# Patient Record
Sex: Female | Born: 1950 | Race: Black or African American | Hispanic: No | Smoking: Never smoker
Health system: Southern US, Community
[De-identification: ages and names within clinical notes are randomized; demographics above are authoritative.]

## PROBLEM LIST (undated history)

## (undated) DIAGNOSIS — K219 Gastro-esophageal reflux disease without esophagitis: Secondary | ICD-10-CM

## (undated) DIAGNOSIS — Z9889 Other specified postprocedural states: Secondary | ICD-10-CM

## (undated) DIAGNOSIS — R51 Headache: Secondary | ICD-10-CM

## (undated) DIAGNOSIS — Z9641 Presence of insulin pump (external) (internal): Secondary | ICD-10-CM

## (undated) DIAGNOSIS — Z973 Presence of spectacles and contact lenses: Secondary | ICD-10-CM

## (undated) DIAGNOSIS — J189 Pneumonia, unspecified organism: Secondary | ICD-10-CM

## (undated) DIAGNOSIS — M5126 Other intervertebral disc displacement, lumbar region: Secondary | ICD-10-CM

## (undated) DIAGNOSIS — M1990 Unspecified osteoarthritis, unspecified site: Secondary | ICD-10-CM

## (undated) DIAGNOSIS — E119 Type 2 diabetes mellitus without complications: Secondary | ICD-10-CM

## (undated) DIAGNOSIS — E785 Hyperlipidemia, unspecified: Secondary | ICD-10-CM

## (undated) DIAGNOSIS — I10 Essential (primary) hypertension: Secondary | ICD-10-CM

## (undated) DIAGNOSIS — Z8719 Personal history of other diseases of the digestive system: Secondary | ICD-10-CM

## (undated) DIAGNOSIS — M797 Fibromyalgia: Secondary | ICD-10-CM

## (undated) DIAGNOSIS — Z8739 Personal history of other diseases of the musculoskeletal system and connective tissue: Secondary | ICD-10-CM

## (undated) DIAGNOSIS — I350 Nonrheumatic aortic (valve) stenosis: Secondary | ICD-10-CM

## (undated) DIAGNOSIS — J45909 Unspecified asthma, uncomplicated: Secondary | ICD-10-CM

## (undated) DIAGNOSIS — N393 Stress incontinence (female) (male): Secondary | ICD-10-CM

## (undated) DIAGNOSIS — K08109 Complete loss of teeth, unspecified cause, unspecified class: Secondary | ICD-10-CM

## (undated) DIAGNOSIS — Z794 Long term (current) use of insulin: Secondary | ICD-10-CM

## (undated) DIAGNOSIS — Z972 Presence of dental prosthetic device (complete) (partial): Secondary | ICD-10-CM

## (undated) DIAGNOSIS — N289 Disorder of kidney and ureter, unspecified: Secondary | ICD-10-CM

## (undated) DIAGNOSIS — R519 Headache, unspecified: Secondary | ICD-10-CM

## (undated) DIAGNOSIS — M1712 Unilateral primary osteoarthritis, left knee: Secondary | ICD-10-CM

## (undated) HISTORY — PX: CARDIAC CATHETERIZATION: SHX172

## (undated) HISTORY — PX: KNEE ARTHROSCOPY: SUR90

## (undated) HISTORY — DX: Hyperlipidemia, unspecified: E78.5

## (undated) HISTORY — PX: MULTIPLE TOOTH EXTRACTIONS: SHX2053

---

## 1978-08-27 DIAGNOSIS — E119 Type 2 diabetes mellitus without complications: Secondary | ICD-10-CM | POA: Insufficient documentation

## 1988-08-27 HISTORY — PX: CHOLECYSTECTOMY: SHX55

## 1998-08-27 HISTORY — PX: CARPAL TUNNEL RELEASE: SHX101

## 1999-02-07 ENCOUNTER — Emergency Department (HOSPITAL_COMMUNITY): Admission: EM | Admit: 1999-02-07 | Discharge: 1999-02-07 | Payer: Self-pay | Admitting: Emergency Medicine

## 1999-02-07 ENCOUNTER — Encounter: Payer: Self-pay | Admitting: Emergency Medicine

## 1999-05-01 ENCOUNTER — Emergency Department (HOSPITAL_COMMUNITY): Admission: EM | Admit: 1999-05-01 | Discharge: 1999-05-01 | Payer: Self-pay | Admitting: Emergency Medicine

## 1999-05-01 ENCOUNTER — Encounter: Payer: Self-pay | Admitting: Emergency Medicine

## 1999-05-03 ENCOUNTER — Emergency Department (HOSPITAL_COMMUNITY): Admission: EM | Admit: 1999-05-03 | Discharge: 1999-05-03 | Payer: Self-pay | Admitting: Emergency Medicine

## 1999-07-02 ENCOUNTER — Emergency Department (HOSPITAL_COMMUNITY): Admission: EM | Admit: 1999-07-02 | Discharge: 1999-07-02 | Payer: Self-pay | Admitting: Emergency Medicine

## 1999-08-03 ENCOUNTER — Emergency Department (HOSPITAL_COMMUNITY): Admission: EM | Admit: 1999-08-03 | Discharge: 1999-08-03 | Payer: Self-pay | Admitting: Emergency Medicine

## 1999-08-03 ENCOUNTER — Encounter: Payer: Self-pay | Admitting: Emergency Medicine

## 1999-11-08 ENCOUNTER — Emergency Department (HOSPITAL_COMMUNITY): Admission: EM | Admit: 1999-11-08 | Discharge: 1999-11-08 | Payer: Self-pay | Admitting: Emergency Medicine

## 1999-11-11 ENCOUNTER — Emergency Department (HOSPITAL_COMMUNITY): Admission: EM | Admit: 1999-11-11 | Discharge: 1999-11-11 | Payer: Self-pay | Admitting: Emergency Medicine

## 1999-11-11 ENCOUNTER — Encounter: Payer: Self-pay | Admitting: Emergency Medicine

## 2000-11-22 ENCOUNTER — Emergency Department (HOSPITAL_COMMUNITY): Admission: EM | Admit: 2000-11-22 | Discharge: 2000-11-22 | Payer: Self-pay | Admitting: Emergency Medicine

## 2001-06-14 ENCOUNTER — Emergency Department (HOSPITAL_COMMUNITY): Admission: EM | Admit: 2001-06-14 | Discharge: 2001-06-14 | Payer: Self-pay | Admitting: Emergency Medicine

## 2001-06-20 ENCOUNTER — Encounter: Admission: RE | Admit: 2001-06-20 | Discharge: 2001-06-20 | Payer: Self-pay | Admitting: Endocrinology

## 2001-06-20 ENCOUNTER — Encounter: Payer: Self-pay | Admitting: Endocrinology

## 2001-10-14 ENCOUNTER — Emergency Department (HOSPITAL_COMMUNITY): Admission: EM | Admit: 2001-10-14 | Discharge: 2001-10-14 | Payer: Self-pay | Admitting: Emergency Medicine

## 2001-10-14 ENCOUNTER — Encounter: Payer: Self-pay | Admitting: Emergency Medicine

## 2002-01-18 ENCOUNTER — Encounter: Payer: Self-pay | Admitting: Emergency Medicine

## 2002-01-18 ENCOUNTER — Emergency Department (HOSPITAL_COMMUNITY): Admission: EM | Admit: 2002-01-18 | Discharge: 2002-01-18 | Payer: Self-pay | Admitting: Emergency Medicine

## 2002-03-31 ENCOUNTER — Other Ambulatory Visit: Admission: RE | Admit: 2002-03-31 | Discharge: 2002-03-31 | Payer: Self-pay | Admitting: Family Medicine

## 2002-04-06 ENCOUNTER — Emergency Department (HOSPITAL_COMMUNITY): Admission: EM | Admit: 2002-04-06 | Discharge: 2002-04-06 | Payer: Self-pay | Admitting: Emergency Medicine

## 2002-04-06 ENCOUNTER — Encounter: Payer: Self-pay | Admitting: Emergency Medicine

## 2002-05-15 ENCOUNTER — Encounter: Admission: RE | Admit: 2002-05-15 | Discharge: 2002-05-15 | Payer: Self-pay | Admitting: Gastroenterology

## 2002-05-15 ENCOUNTER — Encounter: Payer: Self-pay | Admitting: Gastroenterology

## 2002-06-17 ENCOUNTER — Ambulatory Visit (HOSPITAL_COMMUNITY): Admission: RE | Admit: 2002-06-17 | Discharge: 2002-06-17 | Payer: Self-pay | Admitting: Gastroenterology

## 2002-06-18 HISTORY — PX: COLONOSCOPY WITH ESOPHAGOGASTRODUODENOSCOPY (EGD): SHX5779

## 2003-06-02 ENCOUNTER — Emergency Department (HOSPITAL_COMMUNITY): Admission: EM | Admit: 2003-06-02 | Discharge: 2003-06-02 | Payer: Self-pay | Admitting: Emergency Medicine

## 2003-06-02 ENCOUNTER — Encounter: Payer: Self-pay | Admitting: Emergency Medicine

## 2005-04-26 ENCOUNTER — Emergency Department (HOSPITAL_COMMUNITY): Admission: EM | Admit: 2005-04-26 | Discharge: 2005-04-26 | Payer: Self-pay | Admitting: Emergency Medicine

## 2005-05-15 ENCOUNTER — Ambulatory Visit (HOSPITAL_COMMUNITY): Admission: RE | Admit: 2005-05-15 | Discharge: 2005-05-15 | Payer: Self-pay | Admitting: Gastroenterology

## 2005-08-27 DIAGNOSIS — K222 Esophageal obstruction: Secondary | ICD-10-CM

## 2006-01-18 ENCOUNTER — Emergency Department (HOSPITAL_COMMUNITY): Admission: EM | Admit: 2006-01-18 | Discharge: 2006-01-19 | Payer: Self-pay | Admitting: Emergency Medicine

## 2006-08-02 ENCOUNTER — Ambulatory Visit: Payer: Self-pay | Admitting: Family Medicine

## 2006-08-02 ENCOUNTER — Observation Stay (HOSPITAL_COMMUNITY): Admission: EM | Admit: 2006-08-02 | Discharge: 2006-08-03 | Payer: Self-pay | Admitting: Emergency Medicine

## 2006-10-31 ENCOUNTER — Encounter: Admission: RE | Admit: 2006-10-31 | Discharge: 2006-10-31 | Payer: Self-pay | Admitting: Family Medicine

## 2006-11-15 ENCOUNTER — Encounter: Admission: RE | Admit: 2006-11-15 | Discharge: 2006-11-15 | Payer: Self-pay | Admitting: Family Medicine

## 2006-11-15 ENCOUNTER — Encounter (INDEPENDENT_AMBULATORY_CARE_PROVIDER_SITE_OTHER): Payer: Self-pay | Admitting: Specialist

## 2008-08-27 HISTORY — PX: EYE SURGERY: SHX253

## 2009-02-07 ENCOUNTER — Encounter: Admission: RE | Admit: 2009-02-07 | Discharge: 2009-02-07 | Payer: Self-pay | Admitting: Family Medicine

## 2009-07-19 ENCOUNTER — Inpatient Hospital Stay (HOSPITAL_COMMUNITY): Admission: AD | Admit: 2009-07-19 | Discharge: 2009-07-21 | Payer: Self-pay | Admitting: Cardiology

## 2009-07-19 DIAGNOSIS — R079 Chest pain, unspecified: Secondary | ICD-10-CM | POA: Insufficient documentation

## 2009-09-08 ENCOUNTER — Ambulatory Visit: Payer: Self-pay | Admitting: Nurse Practitioner

## 2009-09-08 DIAGNOSIS — J45909 Unspecified asthma, uncomplicated: Secondary | ICD-10-CM | POA: Insufficient documentation

## 2009-09-08 DIAGNOSIS — I10 Essential (primary) hypertension: Secondary | ICD-10-CM

## 2009-09-08 DIAGNOSIS — N39498 Other specified urinary incontinence: Secondary | ICD-10-CM | POA: Insufficient documentation

## 2009-09-08 DIAGNOSIS — G589 Mononeuropathy, unspecified: Secondary | ICD-10-CM | POA: Insufficient documentation

## 2009-09-08 LAB — CONVERTED CEMR LAB
Cholesterol, target level: 200 mg/dL
HDL goal, serum: 40 mg/dL
Hgb A1c MFr Bld: 12.3 %

## 2009-09-09 ENCOUNTER — Encounter (INDEPENDENT_AMBULATORY_CARE_PROVIDER_SITE_OTHER): Payer: Self-pay | Admitting: Nurse Practitioner

## 2009-09-22 ENCOUNTER — Ambulatory Visit: Payer: Self-pay | Admitting: Nurse Practitioner

## 2009-09-22 LAB — CONVERTED CEMR LAB
Alkaline Phosphatase: 96 units/L (ref 39–117)
BUN: 14 mg/dL (ref 6–23)
Basophils Relative: 0 % (ref 0–1)
Blood Glucose, Fingerstick: 96
Eosinophils Absolute: 0.1 10*3/uL (ref 0.0–0.7)
Glucose, Bld: 87 mg/dL (ref 70–99)
HDL: 62 mg/dL (ref >39)
Hemoglobin: 13.9 g/dL (ref 12.0–15.0)
Ketones, urine, test strip: NEGATIVE
LDL Cholesterol: 119 mg/dL — ABNORMAL HIGH (ref 0–99)
Lymphs Abs: 1.3 10*3/uL (ref 0.7–4.0)
MCHC: 31.2 g/dL (ref 30.0–36.0)
MCV: 93.3 fL (ref 78.0–100.0)
Monocytes Absolute: 0.3 10*3/uL (ref 0.1–1.0)
Monocytes Relative: 6 % (ref 3–12)
Neutrophils Relative %: 68 % (ref 43–77)
Nitrite: NEGATIVE
RBC: 4.77 M/uL (ref 3.87–5.11)
Sodium: 139 meq/L (ref 135–145)
Specific Gravity, Urine: 1.01
Total Bilirubin: 0.6 mg/dL (ref 0.3–1.2)
Triglycerides: 44 mg/dL (ref <150)
Urobilinogen, UA: 0.2
VLDL: 9 mg/dL (ref 0–40)
WBC Urine, dipstick: NEGATIVE
WBC: 5.3 10*3/uL (ref 4.0–10.5)
pH: 7

## 2009-09-23 ENCOUNTER — Telehealth (INDEPENDENT_AMBULATORY_CARE_PROVIDER_SITE_OTHER): Payer: Self-pay | Admitting: Nurse Practitioner

## 2009-09-23 ENCOUNTER — Encounter (INDEPENDENT_AMBULATORY_CARE_PROVIDER_SITE_OTHER): Payer: Self-pay | Admitting: Nurse Practitioner

## 2009-10-18 ENCOUNTER — Ambulatory Visit: Payer: Self-pay | Admitting: Nurse Practitioner

## 2009-10-18 DIAGNOSIS — R3129 Other microscopic hematuria: Secondary | ICD-10-CM | POA: Insufficient documentation

## 2009-10-18 DIAGNOSIS — M25569 Pain in unspecified knee: Secondary | ICD-10-CM

## 2009-10-19 ENCOUNTER — Encounter (INDEPENDENT_AMBULATORY_CARE_PROVIDER_SITE_OTHER): Payer: Self-pay | Admitting: Nurse Practitioner

## 2009-10-21 DIAGNOSIS — N3000 Acute cystitis without hematuria: Secondary | ICD-10-CM | POA: Insufficient documentation

## 2009-10-24 ENCOUNTER — Encounter (INDEPENDENT_AMBULATORY_CARE_PROVIDER_SITE_OTHER): Payer: Self-pay | Admitting: Nurse Practitioner

## 2009-10-25 ENCOUNTER — Encounter (INDEPENDENT_AMBULATORY_CARE_PROVIDER_SITE_OTHER): Payer: Self-pay | Admitting: Nurse Practitioner

## 2009-10-25 ENCOUNTER — Ambulatory Visit: Payer: Self-pay | Admitting: Physician Assistant

## 2009-10-27 ENCOUNTER — Telehealth (INDEPENDENT_AMBULATORY_CARE_PROVIDER_SITE_OTHER): Payer: Self-pay | Admitting: Nurse Practitioner

## 2009-11-29 ENCOUNTER — Ambulatory Visit: Payer: Self-pay | Admitting: Nurse Practitioner

## 2009-12-23 ENCOUNTER — Telehealth (INDEPENDENT_AMBULATORY_CARE_PROVIDER_SITE_OTHER): Payer: Self-pay | Admitting: Nurse Practitioner

## 2009-12-27 ENCOUNTER — Ambulatory Visit: Payer: Self-pay | Admitting: Nurse Practitioner

## 2009-12-27 DIAGNOSIS — F329 Major depressive disorder, single episode, unspecified: Secondary | ICD-10-CM

## 2009-12-27 DIAGNOSIS — IMO0002 Reserved for concepts with insufficient information to code with codable children: Secondary | ICD-10-CM | POA: Insufficient documentation

## 2010-01-10 ENCOUNTER — Ambulatory Visit: Payer: Self-pay | Admitting: Nurse Practitioner

## 2010-01-10 DIAGNOSIS — D1720 Benign lipomatous neoplasm of skin and subcutaneous tissue of unspecified limb: Secondary | ICD-10-CM

## 2010-01-10 DIAGNOSIS — B373 Candidiasis of vulva and vagina: Secondary | ICD-10-CM

## 2010-01-10 LAB — CONVERTED CEMR LAB
Bilirubin Urine: NEGATIVE
Blood Glucose, Fingerstick: 325
Glucose, Urine, Semiquant: >=1000
Ketones, urine, test strip: NEGATIVE
Nitrite: NEGATIVE
Specific Gravity, Urine: 1.015
Urobilinogen, UA: 0.2

## 2010-01-11 ENCOUNTER — Encounter (INDEPENDENT_AMBULATORY_CARE_PROVIDER_SITE_OTHER): Payer: Self-pay | Admitting: Nurse Practitioner

## 2010-01-16 ENCOUNTER — Ambulatory Visit: Payer: Self-pay | Admitting: Nurse Practitioner

## 2010-01-19 ENCOUNTER — Ambulatory Visit: Payer: Self-pay | Admitting: Nurse Practitioner

## 2010-02-07 ENCOUNTER — Ambulatory Visit: Payer: Self-pay | Admitting: Nurse Practitioner

## 2010-02-07 LAB — CONVERTED CEMR LAB
Bilirubin Urine: NEGATIVE
Blood Glucose, Fingerstick: 335
Glucose, Urine, Semiquant: >=1000
Ketones, urine, test strip: NEGATIVE
Specific Gravity, Urine: 1.01
pH: 6

## 2010-02-08 ENCOUNTER — Telehealth (INDEPENDENT_AMBULATORY_CARE_PROVIDER_SITE_OTHER): Payer: Self-pay | Admitting: Nurse Practitioner

## 2010-02-14 ENCOUNTER — Ambulatory Visit: Payer: Self-pay | Admitting: Nurse Practitioner

## 2010-02-14 LAB — CONVERTED CEMR LAB: Blood Glucose, Fingerstick: 472

## 2010-02-24 ENCOUNTER — Telehealth (INDEPENDENT_AMBULATORY_CARE_PROVIDER_SITE_OTHER): Payer: Self-pay | Admitting: Nurse Practitioner

## 2010-02-28 ENCOUNTER — Ambulatory Visit: Payer: Self-pay | Admitting: Nurse Practitioner

## 2010-02-28 ENCOUNTER — Encounter (INDEPENDENT_AMBULATORY_CARE_PROVIDER_SITE_OTHER): Payer: Self-pay | Admitting: Nurse Practitioner

## 2010-02-28 ENCOUNTER — Telehealth (INDEPENDENT_AMBULATORY_CARE_PROVIDER_SITE_OTHER): Payer: Self-pay | Admitting: Nurse Practitioner

## 2010-02-28 LAB — CONVERTED CEMR LAB
Bilirubin Urine: NEGATIVE
Blood in Urine, dipstick: NEGATIVE
Nitrite: NEGATIVE
Protein, U semiquant: 100
Specific Gravity, Urine: >=1.03
Urobilinogen, UA: 0.2
WBC Urine, dipstick: NEGATIVE

## 2010-03-02 ENCOUNTER — Ambulatory Visit (HOSPITAL_COMMUNITY): Admission: RE | Admit: 2010-03-02 | Discharge: 2010-03-02 | Payer: Self-pay | Admitting: Internal Medicine

## 2010-03-07 ENCOUNTER — Telehealth (INDEPENDENT_AMBULATORY_CARE_PROVIDER_SITE_OTHER): Payer: Self-pay | Admitting: Nurse Practitioner

## 2010-03-09 ENCOUNTER — Ambulatory Visit: Payer: Self-pay | Admitting: Nurse Practitioner

## 2010-03-09 ENCOUNTER — Encounter (INDEPENDENT_AMBULATORY_CARE_PROVIDER_SITE_OTHER): Payer: Self-pay | Admitting: Nurse Practitioner

## 2010-03-09 DIAGNOSIS — M67919 Unspecified disorder of synovium and tendon, unspecified shoulder: Secondary | ICD-10-CM | POA: Insufficient documentation

## 2010-03-09 DIAGNOSIS — M719 Bursopathy, unspecified: Secondary | ICD-10-CM

## 2010-03-09 LAB — CONVERTED CEMR LAB
Blood Glucose, Fingerstick: 364
Blood in Urine, dipstick: NEGATIVE
Protein, U semiquant: NEGATIVE
Urobilinogen, UA: 0.2
pH: 5

## 2010-03-13 ENCOUNTER — Ambulatory Visit: Payer: Self-pay | Admitting: Nurse Practitioner

## 2010-03-17 ENCOUNTER — Telehealth (INDEPENDENT_AMBULATORY_CARE_PROVIDER_SITE_OTHER): Payer: Self-pay | Admitting: Nurse Practitioner

## 2010-03-21 ENCOUNTER — Ambulatory Visit: Payer: Self-pay | Admitting: Nurse Practitioner

## 2010-03-21 LAB — CONVERTED CEMR LAB
Ketones, urine, test strip: NEGATIVE
Nitrite: NEGATIVE
Protein, U semiquant: NEGATIVE
Urobilinogen, UA: 0.2

## 2010-03-22 ENCOUNTER — Encounter (INDEPENDENT_AMBULATORY_CARE_PROVIDER_SITE_OTHER): Payer: Self-pay | Admitting: Nurse Practitioner

## 2010-03-23 ENCOUNTER — Ambulatory Visit: Payer: Self-pay | Admitting: Nurse Practitioner

## 2010-03-23 LAB — CONVERTED CEMR LAB: Blood Glucose, Fingerstick: 342

## 2010-03-30 ENCOUNTER — Ambulatory Visit: Payer: Self-pay | Admitting: Nurse Practitioner

## 2010-04-13 ENCOUNTER — Encounter: Admission: RE | Admit: 2010-04-13 | Discharge: 2010-05-10 | Payer: Self-pay | Admitting: Internal Medicine

## 2010-04-13 ENCOUNTER — Encounter (INDEPENDENT_AMBULATORY_CARE_PROVIDER_SITE_OTHER): Payer: Self-pay | Admitting: Nurse Practitioner

## 2010-04-20 ENCOUNTER — Ambulatory Visit: Payer: Self-pay | Admitting: Nurse Practitioner

## 2010-04-20 DIAGNOSIS — E669 Obesity, unspecified: Secondary | ICD-10-CM

## 2010-04-20 LAB — CONVERTED CEMR LAB
Bilirubin Urine: NEGATIVE
Ketones, urine, test strip: NEGATIVE
Nitrite: NEGATIVE
Protein, U semiquant: 30
Urobilinogen, UA: 0.2
pH: 7

## 2010-05-02 ENCOUNTER — Ambulatory Visit: Payer: Self-pay | Admitting: Physician Assistant

## 2010-05-02 ENCOUNTER — Ambulatory Visit (HOSPITAL_COMMUNITY): Admission: RE | Admit: 2010-05-02 | Discharge: 2010-05-02 | Payer: Self-pay | Admitting: Physician Assistant

## 2010-05-02 DIAGNOSIS — R1084 Generalized abdominal pain: Secondary | ICD-10-CM

## 2010-05-02 DIAGNOSIS — N390 Urinary tract infection, site not specified: Secondary | ICD-10-CM | POA: Insufficient documentation

## 2010-05-02 LAB — CONVERTED CEMR LAB
Bilirubin Urine: NEGATIVE
Blood in Urine, dipstick: NEGATIVE
Glucose, Urine, Semiquant: >=1000
Ketones, urine, test strip: NEGATIVE
Specific Gravity, Urine: 1.01
pH: 8

## 2010-05-03 ENCOUNTER — Encounter: Payer: Self-pay | Admitting: Physician Assistant

## 2010-05-03 LAB — CONVERTED CEMR LAB
ALT: 16 units/L (ref 0–35)
Alkaline Phosphatase: 108 units/L (ref 39–117)
Creatinine, Ser: 1.12 mg/dL (ref 0.40–1.20)
Eosinophils Absolute: 0.1 10*3/uL (ref 0.0–0.7)
Eosinophils Relative: 1 % (ref 0–5)
HCT: 45.6 % (ref 36.0–46.0)
Hemoglobin: 14.4 g/dL (ref 12.0–15.0)
Lipase: 18 units/L (ref 0–75)
Lymphocytes Relative: 21 % (ref 12–46)
Lymphs Abs: 1.4 10*3/uL (ref 0.7–4.0)
MCV: 93.6 fL (ref 78.0–100.0)
Monocytes Absolute: 0.4 10*3/uL (ref 0.1–1.0)
Monocytes Relative: 6 % (ref 3–12)
Platelets: 297 10*3/uL (ref 150–400)
RBC: 4.87 M/uL (ref 3.87–5.11)
Sodium: 137 meq/L (ref 135–145)
Total Bilirubin: 0.7 mg/dL (ref 0.3–1.2)
Total Protein: 7.5 g/dL (ref 6.0–8.3)
WBC: 6.9 10*3/uL (ref 4.0–10.5)

## 2010-05-05 DIAGNOSIS — B3749 Other urogenital candidiasis: Secondary | ICD-10-CM

## 2010-05-09 ENCOUNTER — Ambulatory Visit: Payer: Self-pay | Admitting: Physician Assistant

## 2010-05-10 ENCOUNTER — Encounter (INDEPENDENT_AMBULATORY_CARE_PROVIDER_SITE_OTHER): Payer: Self-pay | Admitting: Nurse Practitioner

## 2010-05-18 ENCOUNTER — Ambulatory Visit: Payer: Self-pay | Admitting: Nurse Practitioner

## 2010-05-18 DIAGNOSIS — K219 Gastro-esophageal reflux disease without esophagitis: Secondary | ICD-10-CM | POA: Insufficient documentation

## 2010-05-23 ENCOUNTER — Ambulatory Visit (HOSPITAL_COMMUNITY): Admission: RE | Admit: 2010-05-23 | Discharge: 2010-05-23 | Payer: Self-pay | Admitting: Internal Medicine

## 2010-05-23 ENCOUNTER — Telehealth (INDEPENDENT_AMBULATORY_CARE_PROVIDER_SITE_OTHER): Payer: Self-pay | Admitting: Nurse Practitioner

## 2010-05-31 ENCOUNTER — Ambulatory Visit: Payer: Self-pay | Admitting: Physician Assistant

## 2010-05-31 ENCOUNTER — Encounter (INDEPENDENT_AMBULATORY_CARE_PROVIDER_SITE_OTHER): Payer: Self-pay | Admitting: Nurse Practitioner

## 2010-05-31 LAB — CONVERTED CEMR LAB: TSH: 2.047 microintl units/mL (ref 0.350–4.500)

## 2010-06-01 ENCOUNTER — Encounter (INDEPENDENT_AMBULATORY_CARE_PROVIDER_SITE_OTHER): Payer: Self-pay | Admitting: Nurse Practitioner

## 2010-06-06 ENCOUNTER — Encounter (INDEPENDENT_AMBULATORY_CARE_PROVIDER_SITE_OTHER): Payer: Self-pay | Admitting: Nurse Practitioner

## 2010-06-07 ENCOUNTER — Encounter (INDEPENDENT_AMBULATORY_CARE_PROVIDER_SITE_OTHER): Payer: Self-pay | Admitting: Nurse Practitioner

## 2010-06-12 ENCOUNTER — Ambulatory Visit (HOSPITAL_COMMUNITY): Admission: RE | Admit: 2010-06-12 | Discharge: 2010-06-12 | Payer: Self-pay | Admitting: Gastroenterology

## 2010-06-12 HISTORY — PX: ESOPHAGOGASTRODUODENOSCOPY (EGD) WITH ESOPHAGEAL DILATION: SHX5812

## 2010-06-13 ENCOUNTER — Ambulatory Visit: Payer: Self-pay | Admitting: Nurse Practitioner

## 2010-06-13 LAB — CONVERTED CEMR LAB
Blood in Urine, dipstick: NEGATIVE
Ketones, urine, test strip: NEGATIVE
Nitrite: NEGATIVE
Urobilinogen, UA: 0.2
WBC Urine, dipstick: NEGATIVE

## 2010-06-19 ENCOUNTER — Encounter: Admission: RE | Admit: 2010-06-19 | Discharge: 2010-06-19 | Payer: Self-pay | Admitting: Nurse Practitioner

## 2010-06-20 ENCOUNTER — Encounter (INDEPENDENT_AMBULATORY_CARE_PROVIDER_SITE_OTHER): Payer: Self-pay | Admitting: Nurse Practitioner

## 2010-06-20 ENCOUNTER — Ambulatory Visit (HOSPITAL_COMMUNITY): Admission: RE | Admit: 2010-06-20 | Discharge: 2010-06-20 | Payer: Self-pay | Admitting: Gastroenterology

## 2010-06-28 ENCOUNTER — Encounter (INDEPENDENT_AMBULATORY_CARE_PROVIDER_SITE_OTHER): Payer: Self-pay | Admitting: Nurse Practitioner

## 2010-07-06 ENCOUNTER — Encounter (INDEPENDENT_AMBULATORY_CARE_PROVIDER_SITE_OTHER): Payer: Self-pay | Admitting: Nurse Practitioner

## 2010-08-14 ENCOUNTER — Encounter (INDEPENDENT_AMBULATORY_CARE_PROVIDER_SITE_OTHER): Payer: Self-pay | Admitting: Nurse Practitioner

## 2010-08-14 ENCOUNTER — Ambulatory Visit: Payer: Self-pay | Admitting: Nurse Practitioner

## 2010-08-14 LAB — CONVERTED CEMR LAB
Blood Glucose, Fingerstick: 315
Ketones, urine, test strip: NEGATIVE
Microalb, Ur: 0.5 mg/dL (ref 0.00–1.89)
Nitrite: NEGATIVE
Protein, U semiquant: NEGATIVE
Urobilinogen, UA: 0.2

## 2010-08-27 HISTORY — PX: OTHER SURGICAL HISTORY: SHX169

## 2010-08-29 ENCOUNTER — Ambulatory Visit
Admission: RE | Admit: 2010-08-29 | Discharge: 2010-08-29 | Payer: Self-pay | Source: Home / Self Care | Attending: Nurse Practitioner | Admitting: Nurse Practitioner

## 2010-08-29 LAB — CONVERTED CEMR LAB
Bilirubin Urine: NEGATIVE
Glucose, Urine, Semiquant: 250
Specific Gravity, Urine: >=1.03
Urobilinogen, UA: 1
pH: 6

## 2010-08-30 ENCOUNTER — Encounter (INDEPENDENT_AMBULATORY_CARE_PROVIDER_SITE_OTHER): Payer: Self-pay | Admitting: Internal Medicine

## 2010-09-12 ENCOUNTER — Ambulatory Visit: Admit: 2010-09-12 | Payer: Self-pay | Admitting: Internal Medicine

## 2010-09-24 LAB — CONVERTED CEMR LAB
Blood Glucose, Fingerstick: 339
GC Probe Amp, Genital: NEGATIVE
Glucose, Urine, Semiquant: >=1000
Specific Gravity, Urine: 1.02
pH: 5

## 2010-09-26 NOTE — Letter (Signed)
Summary: REFERRAL//DIABETES METTITUS//APPT DATE & TIME  REFERRAL//DIABETES METTITUS//APPT DATE & TIME   Imported By: Arta Bruce 12/13/2009 14:54:02  _____________________________________________________________________  External Attachment:    Type:   Image     Comment:   External Document

## 2010-09-26 NOTE — Progress Notes (Signed)
Summary: pain and pressure in lower abdomen  Phone Note Call from Patient   Summary of Call: The pt hss pain in her female area when she try to uriniate and pt doesn't know if she needs to be seen or probably the provider can call her in something at the pharmacy.  [facility] [address] Daphine Deutscher FnP Initial call taken by: Manon Hilding,  March 17, 2010 3:35 PM  Follow-up for Phone Call        forward to N. Daphine Deutscher, FNP spoke with pt  she says for the past 2 days she has been having an urgent feeling to go uriate with pain and alot of lower abdominal pressure, but when she goes to urinate onlly alittle comes out and she is really having alot of pain and  pressure.  Follow-up by: Levon Hedger,  March 17, 2010 4:53 PM  Additional Follow-up for Phone Call Additional follow up Details #1::        It is now after hours on friday  pt has an appt on July 28th Call to check on pt on Monday 03/20/2010 and if she is still symptomatic she can come in for a nurse visit - u/a, wet prep otherwise she can wait until f/u on 03/23/2010  Additional Follow-up by: Lehman Prom FNP,  March 17, 2010 5:05 PM    Additional Follow-up for Phone Call Additional follow up Details #2::    pt called back again today and she will come tomorrow for a wet prep test.Graciela Kellar  March 20, 2010 2:50 PM

## 2010-09-26 NOTE — Progress Notes (Signed)
Summary: Office Visit//DEPRESSION SCREENING  Office Visit//DEPRESSION SCREENING   Imported By: Arta Bruce 02/23/2010 11:15:31  _____________________________________________________________________  External Attachment:    Type:   Image     Comment:   External Document

## 2010-09-26 NOTE — Assessment & Plan Note (Signed)
Summary: Diabetes/HTN   Vital Signs:  Patient profile:   60 year old female Menstrual status:  postmenopausal Height:      61 inches Weight:      203 pounds Temp:     97.3 degrees F oral Pulse rate:   101 / minute Pulse rhythm:   regular BP sitting:   159 / 86  (left arm) Cuff size:   regular  Vitals Entered By: Levon Hedger (April 20, 2010 9:00 AM) CC: follow-up visit 4 week DM, Hypertension Management, Abdominal Pain, Depression Is Patient Diabetic? Yes Pain Assessment Patient in pain? no      CBG Result 224  Does patient need assistance? Functional Status Self care Ambulation Normal   CC:  follow-up visit 4 week DM, Hypertension Management, Abdominal Pain, and Depression.  History of Present Illness:  Pt into the office for diabetes and high blood pressure  Obesity - Pt has been walking for a total of 1 hr per day.  She also has drastically changed her diet and is only eating fish and chicken.  She has decreased sweet tea and sodas from her diet.  She has concerns that she is trying to change her diet but her weight is increasing. Pt has been seeing the nutritionist [facility] Increased 5 pounds since last visit  Depression History:      The patient is having a depressed mood most of the day and has a diminished interest in her usual daily activities.  The patient denies recurrent thoughts of death or suicide.        The patient denies that she feels like life is not worth living, denies that she wishes that she were dead, and denies that she has thought about ending her life.        Comments:  Pt was started on cymbalta on 04/03/2010 by the [facility].  Depression Treatment History:  Prior Medication Used:   Start Date: Assessment of Effect:   Comments:  savella     --     some improvement     will taper off Wellbutrin (bupropion)     02/28/2010     --       not started - could not get from the pharmacy  Diabetes Management History:      The patient  is a 60 years old female who comes in for evaluation of Type 2 Diabetes Mellitus.  She is (or has been) enrolled in the "Diabetic Education Program".  She states understanding of dietary principles and is following her diet appropriately.  Sensory loss is noted.  Self foot exams are not being performed.  She is checking home blood sugars.  She says that she is exercising.  Type of exercise includes: walking.  Duration of exercise is estimated to be 15 min.  She is doing this 3 times per week.        Hypoglycemic symptoms are not occurring.  No hyperglycemic symptoms are reported.  Other comments include: pt presents today with her blood sugar log.        No changes have been made to her treatment plan since last visit.    Dyspepsia History:      She has no alarm features of dyspepsia including no history of melena, hematochezia, dysphagia, persistent vomiting, or involuntary weight loss > 5%.  There is a prior history of GERD.  The patient does not have a prior history of documented ulcer disease.  An H-2 blocker medication is not  currently being taken.  No previous upper endoscopy has been done.    Hypertension History:      She denies headache, chest pain, and palpitations.  She notes no problems with any antihypertensive medication side effects.  Pt was running low on diovan and clonidine and she has not been taking daily so she has been trying to stretch the medicaitons until her visit here.        Positive major cardiovascular risk factors include female age 3 years old or older, diabetes, and hypertension.  Negative major cardiovascular risk factors include non-tobacco-user status.        Further assessment for target organ damage reveals no history of ASHD, cardiac end-organ damage (CHF/LVH), stroke/TIA, peripheral vascular disease, renal insufficiency, or hypertensive retinopathy.       Habits & Providers  Alcohol-Tobacco-Diet     Alcohol drinks/day: 0     Tobacco Status:  never  Exercise-Depression-Behavior     Does Patient Exercise: yes     Exercise Counseling: to improve exercise regimen     Type of exercise: walking     Exercise (avg: min/session): 15 min.     Times/week: 3     Have you felt down or hopeless? no     Have you felt little pleasure in things? no     Depression Counseling: further diagnostic testing and/or other treatment is indicated     Drug Use: never  Allergies (verified): No Known Drug Allergies  Review of Systems General:  Complains of fatigue. CV:  Denies chest pain or discomfort. Resp:  Denies cough. GI:  Complains of abdominal pain and gas; denies diarrhea, nausea, and vomiting. MS:  Complains of joint pain; Pt has started physical therapy and she it has been very effective for her shoulder. Psych:  Complains of depression.  Physical Exam  General:  alert.   Head:  normocephalic.   Eyes:  glasses Lungs:  normal breath sounds.   Heart:  normal rate and regular rhythm.   Abdomen:  normal bowel sounds.   Msk:  up to the exam table Neurologic:  alert & oriented X3.    Diabetes Management Exam:    Foot Exam (with socks and/or shoes not present):       Sensory-Monofilament:          Left foot: normal          Right foot: normal       Nails:          Left foot: thickened          Right foot: thickened   Impression & Recommendations:  Problem # 1:  DIABETES MELLITUS (ICD-250.00) Hgba1c = 10.6 will add janumet to regimen sugar still in urine today Her updated medication list for this problem includes:    Diovan Hct 320-25 Mg Tabs (Valsartan-hydrochlorothiazide) ..... One tablet by mouth daily    Lantus 100 Unit/ml Soln (Insulin glargine) .[name]... 20 units subcutaneously in the morning and 80 units subcutaneously at night    Novolog Mix 70/30 70-30 % Susp (Insulin aspart prot & aspart) .[name]... 20 units subcutaneously in the morning and 25 units in the afternoon    Aspirin 81 Mg Tbec (Aspirin) ..... One tablet by mouth  daily    Janumet 50-500 Mg Tabs (Sitagliptin-metformin hcl) ..... One tablet by mouth two times a day for diabetes  Orders: Capillary Blood Glucose/CBG (82948) Hemoglobin A1C (83036) UA Dipstick w/o Micro (manual) (09381)  Problem # 2:  HYPERTENSION, BENIGN ESSENTIAL (ICD-401.1) BP  elevated but will refill meds Her updated medication list for this problem includes:    Diovan Hct 320-25 Mg Tabs (Valsartan-hydrochlorothiazide) ..... One tablet by mouth daily    Clonidine Hcl 0.1 Mg Tabs (Clonidine hcl) ..... One tablet by mouth two times a day for blood pressure    Caduet 10-20 Mg Tabs (Amlodipine-atorvastatin) ..... One tablet by mouth nightly for blood pressure and cholesterol  Problem # 3:  NEUROPATHY (ICD-355.9) continue lyrica  Problem # 4:  DEPRESSION (ICD-311) pt will taper off savella will continue cymbalta as per the [facility] Her updated medication list for this problem includes:    Cymbalta 30 Mg Cpep (Duloxetine hcl) .[name]... 2 capsules by mouth daily **rx by psych**  Problem # 5:  BURSITIS, LEFT SHOULDER (ICD-726.10) improving with physical therapy  Problem # 6:  OBESITY (ICD-278.00) dietician sessions ongoing - pt advised to continue pt is discouraged at weight gain since last visit - will have her continue on with excercise.  Complete Medication List: 1)  Diovan Hct 320-25 Mg Tabs (Valsartan-hydrochlorothiazide) .... One tablet by mouth daily 2)  Aleve 220 Mg Caps (Naproxen sodium) .... 2 tablets by mouth daily for joints 3)  Oxybutynin Chloride 10 Mg Xr24h-tab (Oxybutynin chloride) .... One tablet by mouth daily for bladder 4)  Lantus 100 Unit/ml Soln (Insulin glargine) .... 20 units subcutaneously in the morning and 80 units subcutaneously at night 5)  Clonidine Hcl 0.1 Mg Tabs (Clonidine hcl) .... One tablet by mouth two times a day for blood pressure 6)  Proventil Hfa 108 (90 Base) Mcg/act Aers (Albuterol sulfate) .... Two inhalations every 6 hours as needed  for shortness of breath 7)  Advair Diskus 250-50 Mcg/dose Aepb (Fluticasone-salmeterol) .... One inhalation two times a day for breathing 8)  Blood Glucose Meter Kit (Blood glucose monitoring suppl) .... Use to check blood sugar three times per day 9)  Lancets Misc (Lancets) .... Use to check blood sugar three times per day dx: insulin dependent diabetes 10)  Blood Glucose Test Strp (Glucose blood) .... Use to check blood sugar three times per day dx: insulin dependent diabetes 11)  Novolog Mix 70/30 70-30 % Susp (Insulin aspart prot & aspart) .... 20 units subcutaneously in the morning and 25 units in the afternoon 12)  Omeprazole 20 Mg Tbec (Omeprazole) .... One capsule by mouth daily before breakfast 13)  Aspirin 81 Mg Tbec (Aspirin) .... One tablet by mouth daily 14)  Caduet 10-20 Mg Tabs (Amlodipine-atorvastatin) .... One tablet by mouth nightly for blood pressure and cholesterol 15)  Diclofenac Sodium 75 Mg Tbec (Diclofenac sodium) .... One tablet by mouth two times a day for back 16)  Lyrica 50 Mg Caps (Pregabalin) .... One capsule by mouth three times a day for neuropathy 17)  Savella 50 Mg Tabs (Milnacipran hcl) .... Taper off 18)  Vicodin 5-500 Mg Tabs (Hydrocodone-acetaminophen) .... One tablet by mouth daily as needed for pain 19)  Cymbalta 30 Mg Cpep (Duloxetine hcl) .... 2 capsules by mouth daily **rx by psych** 20)  Janumet 50-500 Mg Tabs (Sitagliptin-metformin hcl) .... One tablet by mouth two times a day for diabetes  Diabetes Management Assessment/Plan:      The following lipid goals have been established for the patient: Total cholesterol goal of 200; LDL cholesterol goal of 100; HDL cholesterol goal of 40; Triglyceride goal of 150.  Her blood pressure goal is < 140/90.    Hypertension Assessment/Plan:      The patient's hypertensive risk group is  category C: Target organ damage and/or diabetes.  Her calculated 10 year risk of coronary heart disease is 15 %.  Today's blood  pressure is 159/86.  Her blood pressure goal is < 140/90.  Patient Instructions: 1)  Savella - Take 1 tablet by mouth daily for 1 week then take 1/2 tablet by mouth daily for one week then off  2)  Continue the cymbalta as started by the [facility] 3)  Diabetes - Hgba1c = 10.6 4)  It has been only 1 month since the change of needles and injection sites to I expect to still see improvement in the coming months.  Keep on Keeping on with your diet changes and exercise. 5)  Continue insulin at current dose 6)  Will add janumet 50/500 - one tablet by mouth two times a day with meals - will send to [facility] 7)  Follow up in 4 weeks with n.martin,fnp for diabetes. 8)  Will need u/a, cbg. Prescriptions: JANUMET 50-500 MG TABS (SITAGLIPTIN-METFORMIN HCL) One tablet by mouth two times a day for diabetes  #60 x 5   Entered and Authorized by:   Lehman Prom FNP   Signed by:   Lehman Prom FNP on 04/20/2010   Method used:   Faxed to ...       [facility] (retail)       [address] [place], [place]  [address]       Ph: [phone]       Fax: [phone]   RxID:   [id number] CLONIDINE HCL 0.1 MG TABS (CLONIDINE HCL) One tablet by mouth two times a day for blood pressure  #60 x 5   Entered and Authorized by:   Lehman Prom FNP   Signed by:   Lehman Prom FNP on 04/20/2010   Method used:   Faxed to ...       [facility] - [facility] (retail)       [address] [place], [place]  [address]       Ph: [phone]       Fax: [phone]   RxID:   [id number] OXYBUTYNIN CHLORIDE 10 MG XR24H-TAB (OXYBUTYNIN CHLORIDE) One tablet by mouth daily for bladder  #30 x 5   Entered and Authorized by:   Lehman Prom FNP   Signed by:   Lehman Prom FNP on 04/20/2010   Method used:   Faxed to ...       [facility] - [facility] (retail)       [address] [place], [place]  [address]       Ph: [phone]       Fax: [phone]   RxID:   [id number] DIOVAN HCT 320-25 MG TABS (VALSARTAN-HYDROCHLOROTHIAZIDE) One tablet by mouth daily  #30 x 5   Entered and Authorized by:   Lehman Prom FNP   Signed by:   Lehman Prom FNP on 04/20/2010   Method used:   Faxed to ...       [facility] - [facility] (retail)       [address] [place], [place]  [address]       Ph: [phone]       Fax: [phone]   RxID:   [id number]   Laboratory Results   Urine Tests  Date/Time Received: April 20, 2010 9:15 AM   Routine Urinalysis   Color: lt. yellow Appearance: Clear Glucose: 250   (Normal Range: Negative) Bilirubin: negative   (Normal Range: Negative) Ketone: negative   (Normal Range: Negative) Spec. Gravity: 1.015   (Normal Range: 1.003-1.035) Blood: negative   (Normal Range: Negative) pH: 7.0   (Normal Range: 5.0-8.0) Protein: 30   (Normal Range: Negative) Urobilinogen: 0.2   (Normal Range: 0-1) Nitrite: negative   (Normal Range: Negative) Leukocyte Esterace: large   (Normal Range: Negative)     Blood Tests   Date/Time Received: April 20, 2010 9:14 AM   HGBA1C: 10.6%   (Normal Range: Non-Diabetic - 3-6%   Control Diabetic - 6-8%) CBG Random:: 224       Last LDL:                                                 119 (09/22/2009 9:06:00 PM)        Diabetic Foot Exam    10-g (5.07) Semmes-Weinstein Monofilament Test Performed by: Levon Hedger          Right Foot          Left Foot Visual Inspection               Test Control      normal         normal Site 1         normal         normal Site 2         normal         normal Site 3         normal         normal Site 4         normal         normal Site 5         normal         normal Site 6         normal         normal Site 7         normal         normal Site 8         normal         normal Site 9          normal         normal Site 10         normal         normal  Impression      normal         normal  Appended Document: Diabetes/HTN    Clinical Lists Changes  Medications: Changed medication from OXYBUTYNIN CHLORIDE 10 MG XR24H-TAB (OXYBUTYNIN CHLORIDE) One tablet by mouth daily for bladder to OXYBUTYNIN CHLORIDE 5 MG TABS (OXYBUTYNIN CHLORIDE) One tablet by mouth two times a day for bladder - Signed Rx of OXYBUTYNIN CHLORIDE 5 MG TABS (OXYBUTYNIN CHLORIDE) One tablet by mouth two times a day for bladder;  #60 x 5;  Signed;  Entered by: Lehman Prom FNP;  Authorized by: Lehman Prom FNP;  Method used: Faxed to [facility], [address], [place], [place]  [address], Ph: [phone], Fax: [phone]    Prescriptions: OXYBUTYNIN CHLORIDE 5 MG TABS (OXYBUTYNIN CHLORIDE) One tablet by  mouth two times a day for bladder  #60 x 5   Entered and Authorized by:   Lehman Prom FNP   Signed by:   Lehman Prom FNP on 04/20/2010   Method used:   Faxed to ...       [facility] - [facility] (retail)       [address] [place], [place]  [address]       Ph: [phone]       Fax: [phone]   RxID:   [id number]

## 2010-09-26 NOTE — Assessment & Plan Note (Signed)
Summary: Complete Physical Exam   Vital Signs:  Patient profile:   60 year old female Menstrual status:  postmenopausal Weight:      188.9 pounds Temp:     97.7 degrees F oral Pulse rate:   92 / minute Pulse rhythm:   regular Resp:     18 per minute BP sitting:   166 / 78  (left arm) Cuff size:   regular  Vitals Entered By: Geanie Cooley  (October 18, 2009 10:59 AM) CC: Pt here for CPP. Pt states she has been having some pain from her feet up to her legs from the neuropathy. pt states she has some burning pain in her ower back as well., Hypertension Management Is Patient Diabetic? Yes Did you bring your meter with you today? No Pain Assessment Patient in pain? yes     Location: legs Intensity: 5 Type: shooting/squeezing CBG Result 339  Does patient need assistance? Functional Status Self care Ambulation Normal     Menstrual Status postmenopausal  Diabetic Foot Exam Foot Inspection Is there a history of a foot ulcer?              No Is there a foot ulcer now?              No Can the patient see the bottom of their feet?          Yes Are the shoes appropriate in style and fit?          No Is there swelling or an abnormal foot shape?          No Are the toenails long?                No Are the toenails thick?                Yes Are the toenails ingrown?              No Is there heavy callous build-up?              No Is there pain in the calf muscle (Intermittent claudication) when walking?    NoIs there a claw toe deformity?              No Is there elevated skin temperature?            No Is there limited ankle dorsiflexion?            No Is there foot or ankle muscle weakness?            No  Diabetic Foot Care Education Pulse Check          Right Foot          Left Foot Dorsalis Pedis:        normal            normal    10-g (5.07) Semmes-Weinstein Monofilament Test Performed by: Geanie Cooley          Right Foot          Left Foot Visual Inspection                  CC:  Pt here for CPP. Pt states she has been having some pain from her feet up to her legs from the neuropathy. pt states she has some burning pain in her ower back as well. and Hypertension Management.  History of Present Illness:  Pt into the office for complete physical exam  PAP -  Done over 1 year ago. No family history of cervical or ovarian cancer  Mammogram - reports that her last mammogram was done in September 2010 at [facility]  Optho - no recent eye exam, last done 2 years ago  Dental -  no recent dental exam  Diabetes Management History:      The patient is a 60 years old female who comes in for evaluation of DM Type 2.  She has not been enrolled in the "Diabetic Education Program".  She states lack of understanding of dietary principles and is not following her diet appropriately.  She is checking home blood sugars.  She says that she is not exercising regularly.        Hypoglycemic symptoms are not occurring.  No hyperglycemic symptoms are reported.        Symptoms which suggest diabetic complications include paresthesias.  No changes have been made to her treatment plan since last visit.    Hypertension History:      Pt has not taken clonidine yet today.        Positive major cardiovascular risk factors include female age 59 years old or older, diabetes, and hypertension.  Negative major cardiovascular risk factors include non-tobacco-user status.        Further assessment for target organ damage reveals no history of ASHD, cardiac end-organ damage (CHF/LVH), stroke/TIA, peripheral vascular disease, renal insufficiency, or hypertensive retinopathy.      Habits & Providers  Alcohol-Tobacco-Diet     Alcohol drinks/day: 0     Tobacco Status: never  Exercise-Depression-Behavior     Does Patient Exercise: yes     Exercise Counseling: not indicated; exercise is adequate     Have you felt down or hopeless? no     Have you felt little pleasure in  things? no     Depression Counseling: not indicated; screening negative for depression     Drug Use: never  Comments: PHQ-9 score = 9  Allergies (verified): No Known Drug Allergies  Social History: Does Patient Exercise:  yes  Review of Systems General:  Denies fever. Eyes:  Denies blurring. ENT:  Denies earache. CV:  Denies chest pain or discomfort. Resp:  Denies cough. GI:  Denies abdominal pain, nausea, and vomiting. GU:  Denies dysuria. MS:  Complains of joint pain; bil knee pain especially with ambulation during the day. Derm:  Denies rash. Neuro:  Complains of tingling; denies headaches. Psych:  Denies anxiety and depression.  Physical Exam  General:  alert.   Head:  normocephalic.   Eyes:  pupils rounds Ears:  Tm visible with bony landmarks Nose:  no nasal discharge.   Mouth:  pharynx pink and moist.   Neck:  supple.   Chest Wall:  no mass.   Breasts:  skin/areolae normal, no masses, and no abnormal thickening.   Lungs:  normal breath sounds.   Heart:  normal rate and regular rhythm.   Abdomen:  soft, non-tender, and normal bowel sounds.   Rectal:  no external abnormalities.   Msk:  up to the exam table Extremities:  no edema Neurologic:  alert & oriented X3.   Skin:  color normal.   Psych:  Oriented X3.    Pelvic Exam  Vulva:      normal appearance.   Urethra and Bladder:      Urethra--normal.  Bladder--normal.   Vagina:      physiologic discharge.   Cervix:      midposition.   Adnexa:  normal.   Rectum:      heme negative stool.    Diabetes Management Exam:    Foot Exam (with socks and/or shoes not present):       Sensory-Monofilament:          Left foot: normal          Right foot: normal       Nails:          Left foot: thickened          Right foot: thickened    Impression & Recommendations:  Problem # 1:  ROUTINE GYNECOLOGICAL EXAMINATION (ICD-V72.31) Labs reviewed from previous visit PAP done rec optho and dental guaiac  negative colonscopy up to date tdap given today EKG done PHQ-9 score = 9 Orders: T- GC Chlamydia (09811)  Problem # 2:  DIABETES MELLITUS (ICD-250.00) Blood sugars are still elevated.  will increase novolin to 12 units Her updated medication list for this problem includes:    Diovan Hct 320-25 Mg Tabs (Valsartan-hydrochlorothiazide) ..... One tablet by mouth daily    Lantus 100 Unit/ml Soln (Insulin glargine) .Marland KitchenMarland KitchenMarland KitchenMarland Kitchen 70  units at night subcutaneously for diabetes    Novolin N 100 Unit/ml Susp (Insulin isophane human) .[name]... 12 units subcutaneously three times a day with meals  Orders: Capillary Blood Glucose/CBG (91478) Misc. Referral (Misc. Ref)  Problem # 3:  HYPERTENSION, BENIGN ESSENTIAL (ICD-401.1) advised pt to take clonidine in the morning with diovan. Her updated medication list for this problem includes:    Diovan Hct 320-25 Mg Tabs (Valsartan-hydrochlorothiazide) ..... One tablet by mouth daily    Clonidine Hcl 0.1 Mg Tabs (Clonidine hcl) ..... One tablet by mouth two times a day for blood pressure  Problem # 4:  MICROSCOPIC HEMATURIA (ICD-599.72)  Orders: T-Culture, Urine ([id number])  Problem # 5:  KNEE PAIN, BILATERAL (ICD-719.46) aleve as needed  Her updated medication list for this problem includes:    Aleve 220 Mg Caps (Naproxen sodium) .[name]... 2 tablets by mouth daily for joints  Complete Medication List: 1)  Diovan Hct 320-25 Mg Tabs (Valsartan-hydrochlorothiazide) .... One tablet by mouth daily 2)  Aleve 220 Mg Caps (Naproxen sodium) .... 2 tablets by mouth daily for joints 3)  Oxybutynin Chloride 10 Mg Xr24h-tab (Oxybutynin chloride) .... One tablet by mouth daily for bladder 4)  Gabapentin 300 Mg Caps (Gabapentin) .... One capsule by mouth three times a day for feet 5)  Lantus 100 Unit/ml Soln (Insulin glargine) .... 70  units at night subcutaneously for diabetes 6)  Clonidine Hcl 0.1 Mg Tabs (Clonidine hcl) .... One tablet by mouth two times a day for blood  pressure 7)  Proventil Hfa 108 (90 Base) Mcg/act Aers (Albuterol sulfate) .... Two inhalations every 6 hours as needed for shortness of breath 8)  Advair Diskus 250-50 Mcg/dose Aepb (Fluticasone-salmeterol) .... One inhalation two times a day for breathing 9)  Blood Glucose Meter Kit (Blood glucose monitoring suppl) .... Use to check blood sugar three times per day 10)  Lancets Misc (Lancets) .... Use to check blood sugar three times per day dx: insulin dependent diabetes 11)  Blood Glucose Test Strp (Glucose blood) .... Use to check blood sugar three times per day dx: insulin dependent diabetes 12)  Novolin N 100 Unit/ml Susp (Insulin isophane human) .[name].. 12 units subcutaneously three times a day with meals 13)  Omeprazole 20 Mg Tbec (Omeprazole) .... One capsule by mouth daily before breakfast 14)  Pravastatin Sodium 10 Mg Tabs (Pravastatin sodium) .... One  tablet by mouth nightly for cholesterol  Other Orders: State-TD Vaccine 7 yrs. & > IM (16109U) Admin 1st Vaccine (04540) Admin 1st Vaccine [facility]) [phone])  Diabetes Management Assessment/Plan:      The following lipid goals have been established for the patient: Total cholesterol goal of 200; LDL cholesterol goal of 100; HDL cholesterol goal of 40; Triglyceride goal of 150.  Her blood pressure goal is < 140/90.    Hypertension Assessment/Plan:      The patient's hypertensive risk group is category C: Target organ damage and/or diabetes.  Her calculated 10 year risk of coronary heart disease is 17 %.  Today's blood pressure is 166/78.  Her blood pressure goal is < 140/90.  Patient Instructions: 1)  Schedule a follow up appointment for diabetes the first week in April. You will need a pneumovax 2)  If blood sugars still elevated will need to re-adjust insulin 3)  Get an appointment with Susie Piper - diabetes educator 4)  Take a food diary of what you eat for breakfast, lunch and dinner for 1 week before going to see her. 5)  Continue  to check your blood sugar and record   Last LDL:                                                 119 (09/22/2009 9:06:00 PM)        Diabetic Foot Exam Pulse Check          Right Foot          Left Foot Dorsalis Pedis:        normal            normal    10-g (5.07) Semmes-Weinstein Monofilament Test Performed by: Geanie Cooley          Right Foot          Left Foot Visual Inspection               Test Control      normal         normal Site 1         normal         normal Site 2         normal         normal Site 3         normal         normal Site 4         normal         normal Site 5         normal         normal Site 6         normal         normal Site 7         normal         normal Site 8         normal         normal Site 9         normal         normal Site 10         normal         normal  Impression      normal         normal   Laboratory  Results   Urine Tests  Date/Time Received: October 18, 2009 11:30 AM   Routine Urinalysis   Color: yellow Appearance: Clear Glucose: >=1000   (Normal Range: Negative) Bilirubin: negative   (Normal Range: Negative) Ketone: negative   (Normal Range: Negative) Spec. Gravity: 1.020   (Normal Range: 1.003-1.035) Blood: trace-intact   (Normal Range: Negative) pH: 5.0   (Normal Range: 5.0-8.0) Protein: trace   (Normal Range: Negative) Urobilinogen: 0.2   (Normal Range: 0-1) Nitrite: negative   (Normal Range: Negative) Leukocyte Esterace: trace   (Normal Range: Negative)     Blood Tests     CBG Random:: 339    [place]/KOH Source: vaginal WBC/hpf: 5-10 Bacteria/hpf: 1+ Clue cells/hpf: none Yeast/hpf: few Trichomonas/hpf: none  Stool - Occult Blood Hemmoccult #1: negative Date: 10/18/2009    Tetanus/Td Vaccine    Vaccine Type: Tdap (State)    Site: right deltoid    Mfr: GlaxoSmithKline    Dose: 0.5 ml    Route: IM    Given by: Geanie Cooley     Exp. Date: 05/14/2011    Lot #: [id number]     VIS given: 07/15/07 version given October 18, 2009.  Prevention & Chronic Care Immunizations   Influenza vaccine: historical per pt  (06/27/2009)    Tetanus booster: 10/18/2009: Tdap (State)    Pneumococcal vaccine: Not documented  Colorectal Screening   Hemoccult: Not documented   Hemoccult action/deferral: Ordered  (10/18/2009)   Hemoccult due: 10/18/2010    Colonoscopy: historical per pt - normal  (08/27/2005)  Other Screening   Pap smear: Not documented   Pap smear action/deferral: Ordered  (10/18/2009)    Mammogram: historical per pt  (04/27/2009)   Smoking status: never  (10/18/2009)  Diabetes Mellitus   HgbA1C: 12.3  (09/08/2009)   HgbA1C action/deferral: Ordered  (10/18/2009)   Hemoglobin A1C due: 12/07/2009    Eye exam: Not documented    Foot exam: yes  (10/18/2009)   Foot exam action/deferral: Do today   High risk foot: Yes  (09/22/2009)   Foot care education: Done  (09/08/2009)    Urine microalbumin/creatinine ratio: Not documented    Diabetes flowsheet reviewed?: Yes   Progress toward A1C goal: Unchanged  Lipids   Total Cholesterol: 190  (09/22/2009)   LDL: 119  (09/22/2009)   LDL Direct: Not documented   HDL: 62  (09/22/2009)   Triglycerides: 44  (09/22/2009)  Hypertension   Last Blood Pressure: 166 / 78  (10/18/2009)   Serum creatinine: 1.02  (09/22/2009)   Serum potassium 3.9  (09/22/2009)    Hypertension flowsheet reviewed?: Yes   Progress toward BP goal: Unchanged  Self-Management Support :   Personal Goals (by the next clinic visit) :     Personal A1C goal: 7  (10/18/2009)     Personal blood pressure goal: 140/90  (10/18/2009)     Personal LDL goal: 70  (10/18/2009)    Diabetes self-management support: Not documented    Hypertension self-management support: Not documented    Osteoporosis  Patient complains of: kyphosis No back pain No prior fracture No height loss No  Patient reports: Personal history of fracture No Hx  of Fx in a 1st degree relative No Caucasian/Asian Race No Advanced Age No Female Gender Yes Dementia No Poor health/fragility No Current cigarette smoker No Low body weight (<127 lbs) No Estrogen deficiency Yes Low calcium intake (lifelong) No Alcoholism No Inadequate physical activity No Poor eyesight/risk of falls No   Laboratory Results   Urine Tests  Routine Urinalysis   Color: yellow Appearance: Clear Glucose: >=1000   (Normal Range: Negative) Bilirubin: negative   (Normal Range: Negative) Ketone: negative   (Normal Range: Negative) Spec. Gravity: 1.020   (Normal Range: 1.003-1.035) Blood: trace-intact   (Normal Range: Negative) pH: 5.0   (Normal Range: 5.0-8.0) Protein: trace   (Normal Range: Negative) Urobilinogen: 0.2   (Normal Range: 0-1) Nitrite: negative   (Normal Range: Negative) Leukocyte Esterace: trace   (Normal Range: Negative)     Blood Tests     CBG Random:: 339mg /dL    DIRECTV KOH: Negative  Stool - Occult Blood Hemmoccult #1: negative

## 2010-09-26 NOTE — Miscellaneous (Signed)
Summary: Rehab Report//DISCHARGE SUMMARY  Rehab Report//DISCHARGE SUMMARY   Imported By: Arta Bruce 05/17/2010 15:11:08  _____________________________________________________________________  External Attachment:    Type:   Image     Comment:   External Document

## 2010-09-26 NOTE — Assessment & Plan Note (Signed)
Summary: Diabetes/HTN   Vital Signs:  Patient profile:   60 year old female Menstrual status:  postmenopausal Weight:      191.7 pounds BMI:     36.35 Temp:     98.2 degrees F oral Pulse rate:   84 / minute Pulse rhythm:   regular BP sitting:   172 / 84  (left arm) Cuff size:   regular  Vitals Entered By: Levon Hedger (February 07, 2010 9:54 AM)  Nutrition Counseling: Patient's BMI is greater than 25 and therefore counseled on weight management options. CC: follow-up visit 4 weeks DM...has been having a headache alot...still having pain when she urinates, Hypertension Management, Abdominal Pain, Depression Is Patient Diabetic? Yes Pain Assessment Patient in pain? yes     Location: back, legs, headache CBG Result 335 CBG Device ID [id number]  Does patient need assistance? Functional Status Self care Ambulation Normal   CC:  follow-up visit 4 weeks DM...has been having a headache alot...still having pain when she urinates, Hypertension Management, Abdominal Pain, and Depression.  History of Present Illness:  Pt into the office still with complaints of elevated blood sugars "nightly reading close to 500" Pt presents today with her blood sugar log.  Obesity - pt has tried to increase her exercise but pain  has prevented increase to the goal of 15-20 minutes per day The patient denies that she feels like life is not worth living, denies that she wishes that she were dead, and denies that she has thought about ending her life.        Comments:  pt started on savella during her last visit. She has been to see Aquilla Solian as ordered.  Diabetes Management History:      The patient is a 60 years old female who comes in for evaluation of Type 2 Diabetes Mellitus.  She has not been enrolled in the "Diabetic Education Program".  She states understanding of dietary principles and is following her diet appropriately.  No sensory loss is reported.  Self foot exams are not being performed.  She  is checking home blood sugars.  She says that she is exercising.        No changes have been made to her treatment plan since last visit.    Dyspepsia History:      She has no alarm features of dyspepsia including no history of melena, hematochezia, dysphagia, persistent vomiting, or involuntary weight loss > 5%.  There is a prior history of GERD.  The patient does not have a prior history of documented ulcer disease.  The dominant symptom is heartburn or acid reflux.  An H-2 blocker medication is not currently being taken.    Hypertension History:      She denies headache, chest pain, and palpitations.  She notes the following problems with antihypertensive medication side effects: dry mouth side effect.  Further comments include: pt has not been able to get amlodipine from the pharmacy as ordered.        Positive major cardiovascular risk factors include female age 86 years old or older, diabetes, and hypertension.  Negative major cardiovascular risk factors include non-tobacco-user status.        Further assessment for target organ damage reveals no history of ASHD, cardiac end-organ damage (CHF/LVH), stroke/TIA, peripheral vascular disease, renal insufficiency, or hypertensive retinopathy.        Habits & Providers  Alcohol-Tobacco-Diet     Alcohol drinks/day: 0     Tobacco Status: never  Exercise-Depression-Behavior  Does Patient Exercise: yes     Exercise Counseling: to improve exercise regimen     Have you felt down or hopeless? yes     Have you felt little pleasure in things? yes     Depression Counseling: further diagnostic testing and/or other treatment is indicated     Drug Use: never  Comments: she has started to walk but the pain in back is just too intense  Medications Prior to Update: 1)  Diovan Hct 320-25 Mg Tabs (Valsartan-Hydrochlorothiazide) .... One Tablet By Mouth Daily 2)  Aleve 220 Mg Caps (Naproxen Sodium) .... 2 Tablets By Mouth Daily For Joints 3)   Oxybutynin Chloride 10 Mg Xr24h-Tab (Oxybutynin Chloride) .... One Tablet By Mouth Daily For Bladder 4)  Lantus 100 Unit/ml Soln (Insulin Glargine) .... 80  Units At Night Subcutaneously For Diabetes 5)  Clonidine Hcl 0.1 Mg Tabs (Clonidine Hcl) .... One Tablet By Mouth Two Times A Day For Blood Pressure 6)  Proventil Hfa 108 (90 Base) Mcg/act Aers (Albuterol Sulfate) .... Two Inhalations Every 6 Hours As Needed For Shortness of Breath 7)  Advair Diskus 250-50 Mcg/dose Aepb (Fluticasone-Salmeterol) .... One Inhalation Two Times A Day For Breathing 8)  Blood Glucose Meter  Kit (Blood Glucose Monitoring Suppl) .... Use To Check Blood Sugar Three Times Per Day 9)  Lancets  Misc (Lancets) .... Use To Check Blood Sugar Three Times Per Day Dx: Insulin Dependent Diabetes 10)  Blood Glucose Test  Strp (Glucose Blood) .... Use To Check Blood Sugar Three Times Per Day Dx: Insulin Dependent Diabetes 11)  Novolog 100 Unit/ml Soln (Insulin Aspart) .[name].. 16 Units Subcutaneously With Breakfast, 18 Units Subcutaneously With Lunch, 14 Units Subcutaneously With Dinner For Diabetes 12)  Omeprazole 20 Mg Tbec (Omeprazole) .... One Capsule By Mouth Daily Before Breakfast 13)  Aspirin 81 Mg Tbec (Aspirin) .... One Tablet By Mouth Daily 14)  Caduet 10-20 Mg Tabs (Amlodipine-Atorvastatin) .... One Tablet By Mouth Nightly For Blood Pressure and Cholesterol 15)  Diclofenac Sodium 75 Mg Tbec (Diclofenac Sodium) .... One Tablet By Mouth Two Times A Day For Back 16)  Lyrica 50 Mg Caps (Pregabalin) .... One Capsule By Mouth Three Times A Day For Neuropathy 17)  Savella 50 Mg Tabs (Milnacipran Hcl) .... One Tablet By Mouth Two Times A Day 18)  Pravastatin Sodium 10 Mg Tabs (Pravastatin Sodium) .... One Tablet By Mouth Nightly For Cholesterol 19)  Bactrim 400-80 Mg Tabs (Sulfamethoxazole-Trimethoprim) .... One Tablet By Mouth Two Times A Day For Infection  Allergies (verified): No Known Drug Allergies  Review of  Systems General:  Complains of sleep disorder; mouth dryness. CV:  Denies chest pain or discomfort. Resp:  Denies cough. GI:  Denies abdominal pain, nausea, and vomiting. GU:  Complains of nocturia and urinary frequency; denies dysuria. Psych:  Complains of depression. Endo:  Complains of excessive thirst and polyuria.  Physical Exam  General:  alert.   Head:  normocephalic.   Eyes:  glasses Ears:  ear piercing(s) noted.   Lungs:  normal breath sounds.   Heart:  normal rate and regular rhythm.   Msk:  up ot the exam table Neurologic:  alert & oriented X3.   Skin:  color normal.   Psych:  Oriented X3.    Diabetes Management Exam:    Foot Exam (with socks and/or shoes not present):       Inspection:          Left foot: abnormal  Comments: dry skin          Right foot: abnormal             Comments: dry skin       Nails:          Left foot: thickened          Right foot: normal   Impression & Recommendations:  Problem # 1:  DIABETES MELLITUS (ICD-250.00) still very controlled will change pt back to 70/30 with lantus Her updated medication list for this problem includes:    Diovan Hct 320-25 Mg Tabs (Valsartan-hydrochlorothiazide) ..... One tablet by mouth daily    Lantus 100 Unit/ml Soln (Insulin glargine) .Marland KitchenMarland KitchenMarland KitchenMarland Kitchen 80  units at night subcutaneously for diabetes *note change in dose**    Novolog Mix 70/30 70-30 % Susp (Insulin aspart prot & aspart) .Marland KitchenMarland KitchenMarland KitchenMarland Kitchen 15 units subcutaneously two times a day  **pharmacy d/c regular insulin**    Aspirin 81 Mg Tbec (Aspirin) ..... One tablet by mouth daily  Orders: Capillary Blood Glucose/CBG (63875) UA Dipstick w/o Micro (manual) (64332)  Problem # 2:  HYPERTENSION, BENIGN ESSENTIAL (ICD-401.1) BP still elevated Pt has not had amlodipine.  Caduet is at the pharmacy and pt advised to pick them up Her updated medication list for this problem includes:    Diovan Hct 320-25 Mg Tabs (Valsartan-hydrochlorothiazide) ..... One tablet  by mouth daily    Clonidine Hcl 0.1 Mg Tabs (Clonidine hcl) ..... One tablet by mouth two times a day for blood pressure    Caduet 10-20 Mg Tabs (Amlodipine-atorvastatin) ..... One tablet by mouth nightly for blood pressure and cholesterol  Problem # 3:  DEPRESSION (ICD-311) Pt will continue savella will consider adding another agent if depression still persists  Problem # 4:  NEUROPATHY (ICD-355.9) lyrica is in at the pharmacy  advised pt to stop neurontin once she starts lyrica  Complete Medication List: 1)  Diovan Hct 320-25 Mg Tabs (Valsartan-hydrochlorothiazide) .... One tablet by mouth daily 2)  Aleve 220 Mg Caps (Naproxen sodium) .... 2 tablets by mouth daily for joints 3)  Oxybutynin Chloride 10 Mg Xr24h-tab (Oxybutynin chloride) .... One tablet by mouth daily for bladder 4)  Lantus 100 Unit/ml Soln (Insulin glargine) .... 80  units at night subcutaneously for diabetes *note change in dose** 5)  Clonidine Hcl 0.1 Mg Tabs (Clonidine hcl) .... One tablet by mouth two times a day for blood pressure 6)  Proventil Hfa 108 (90 Base) Mcg/act Aers (Albuterol sulfate) .... Two inhalations every 6 hours as needed for shortness of breath 7)  Advair Diskus 250-50 Mcg/dose Aepb (Fluticasone-salmeterol) .... One inhalation two times a day for breathing 8)  Blood Glucose Meter Kit (Blood glucose monitoring suppl) .... Use to check blood sugar three times per day 9)  Lancets Misc (Lancets) .... Use to check blood sugar three times per day dx: insulin dependent diabetes 10)  Blood Glucose Test Strp (Glucose blood) .... Use to check blood sugar three times per day dx: insulin dependent diabetes 11)  Novolog Mix 70/30 70-30 % Susp (Insulin aspart prot & aspart) .[name].. 15 units subcutaneously two times a day  **pharmacy d/c regular insulin** 12)  Omeprazole 20 Mg Tbec (Omeprazole) .... One capsule by mouth daily before breakfast 13)  Aspirin 81 Mg Tbec (Aspirin) .... One tablet by mouth daily 14)  Caduet  10-20 Mg Tabs (Amlodipine-atorvastatin) .... One tablet by mouth nightly for blood pressure and cholesterol 15)  Diclofenac Sodium 75 Mg Tbec (Diclofenac sodium) .... One tablet  by mouth two times a day for back 16)  Lyrica 50 Mg Caps (Pregabalin) .... One capsule by mouth three times a day for neuropathy 17)  Savella 50 Mg Tabs (Milnacipran hcl) .... One tablet by mouth two times a day 18)  Pravastatin Sodium 10 Mg Tabs (Pravastatin sodium) .... One tablet by mouth nightly for cholesterol  Other Orders: T-Culture, Urine ([id number])  Diabetes Management Assessment/Plan:      The following lipid goals have been established for the patient: Total cholesterol goal of 200; LDL cholesterol goal of 100; HDL cholesterol goal of 40; Triglyceride goal of 150.  Her blood pressure goal is < 140/90.    Hypertension Assessment/Plan:      The patient's hypertensive risk group is category C: Target organ damage and/or diabetes.  Her calculated 10 year risk of coronary heart disease is 17 %.  Today's blood pressure is 172/84.  Her blood pressure goal is < 140/90.  Patient Instructions: 1)  Diabetes - blood sugar is still elevated. 2)  Will change insulin back to 70/30 - 15 units subcutaneously two times a day  3)  Lantus 80 units subcutaneously nightly 4)  Depression - keep on the savella 50mg  by mouth two times a day  5)  Blood pressure - will increase the clonidine to 0.2mg  by mouth daily 6)  Follow up with n.martin on July 5th - Schedule after 10:00pm so that you can finish your session with Aquilla Solian 7)  May consider adding additional medication for depression Prescriptions: LANTUS 100 UNIT/ML SOLN (INSULIN GLARGINE) 80  units at night subcutaneously for diabetes *note change in dose**  #1 month qs x 5   Entered and Authorized by:   Lehman Prom FNP   Signed by:   Lehman Prom FNP on 02/07/2010   Method used:   Faxed to ...       [facility] (retail)        [address] [place], [place]  [address]       Ph: [phone]       Fax: [phone]   RxID:   [id number] NOVOLOG MIX 70/30 70-30 % SUSP (INSULIN ASPART PROT & ASPART) 15 units subcutaneously two times a day  **pharmacy d/c regular insulin**  #1 month qs x 3   Entered and Authorized by:   Lehman Prom FNP   Signed by:   Lehman Prom FNP on 02/07/2010   Method used:   Faxed to ...       [facility] (retail)       [address] [place], [place]  [address]       Ph: [phone]       Fax: [phone]   RxID:   [id number]   Laboratory Results   Urine Tests  Date/Time Received: February 07, 2010 10:07 AM   Routine Urinalysis   Color: dk red Appearance: Cloudy Glucose: >=1000   (Normal Range: Negative) Bilirubin: negative   (Normal Range: Negative) Ketone: negative   (Normal Range: Negative) Spec. Gravity: 1.010   (Normal Range: 1.003-1.035) Blood: trace-intact   (Normal Range: Negative) pH: 6.0   (Normal Range: 5.0-8.0) Protein: trace   (Normal Range: Negative) Urobilinogen: 0.2   (Normal Range: 0-1) Nitrite: negative   (Normal Range: Negative) Leukocyte Esterace: trace   (Normal Range: Negative)     Blood Tests     CBG Random:: 335

## 2010-09-26 NOTE — Assessment & Plan Note (Signed)
Summary: New - Establish care   Vital Signs:  Patient profile:   60 year old female Height:      61 inches Weight:      171 pounds BMI:     32.43 Temp:     97.8 degrees F oral Pulse rate:   88 / minute Pulse rhythm:   regular Resp:     18 per minute BP sitting:   189 / 106  Vitals Entered By: Armenia Shannon (September 08, 2009 2:18 PM) CC: NP.... out of meds- two months ago..., CHF Management, Hypertension Management, Lipid Management Is Patient Diabetic? Yes Pain Assessment Patient in pain? no       Does patient need assistance? Functional Status Self care Ambulation Normal   CC:  NP.... out of meds- two months ago..., CHF Management, Hypertension Management, and Lipid Management.  History of Present Illness: Pt into the office to establish care Pt was seen by multiple specialists in the past PCP: Tinnie Gens at [facility]ocr[facility]ology: Dr. Talmage Nap at [facility] Cardiology: Dr. Lynnea Ferrier Pt was admitted to the hospital on 07/19/2009 to 07/21/2009 for chest pain. Pt was dx with unstable angina and due to mulitple cardiac risk factors, catherization was normal.     Diabetes Management History:      The patient is a 60 years old female who comes in for evaluation of DM Type 2.  She has not been enrolled in the "Diabetic Education Program".  She states understanding of dietary principles and is following her diet appropriately.  No sensory loss is reported.  Self foot exams are not being performed.  She is checking home blood sugars.  She says that she is not exercising regularly.        Symptoms which suggest diabetic complications include vision problems.        Her home blood sugars include fasting blood sugars: average: 368.    Hypertension History:      She complains of palpitations, but denies headache, chest pain, and syncope.  Pt has been taking diovan 320/25mg  daily. She was given samples from the hospital.  Further comments include: Stress test and cardiac cath was normal.          Positive major cardiovascular risk factors include female age 41 years old or older, diabetes, and hypertension.  Negative major cardiovascular risk factors include non-tobacco-user status.        Further assessment for target organ damage reveals no history of ASHD, cardiac end-organ damage (CHF/LVH), stroke/TIA, peripheral vascular disease, renal insufficiency, or hypertensive retinopathy.    Lipid Management History:      Positive NCEP/ATP III risk factors include female age 86 years old or older, diabetes, and hypertension.  Negative NCEP/ATP III risk factors include non-tobacco-user status, no ASHD (atherosclerotic heart disease), no prior stroke/TIA, and no peripheral vascular disease.    Diabetic Foot Exam Foot Inspection Is there a history of a foot ulcer?              No Is there a foot ulcer now?              No Can the patient see the bottom of their feet?          Yes Are the shoes appropriate in style and fit?          Yes Is there swelling or an abnormal foot shape?          No Are the toenails long?  No Are the toenails thick?                Yes Are the toenails ingrown?              No Is there heavy callous build-up?              No Is there pain in the calf muscle (Intermittent claudication) when walking?    NoIs there a claw toe deformity?              No Is there elevated skin temperature?            No Is there limited ankle dorsiflexion?            No Is there foot or ankle muscle weakness?            No  Diabetic Foot Care Education Patient educated on appropriate care of diabetic feet.  Pulse Check          Right Foot          Left Foot Dorsalis Pedis:        normal            normal  High Risk Feet? Yes   10-g (5.07) Semmes-Weinstein Monofilament Test           Right Foot          Left Foot Visual Inspection               Test Control      normal         normal Site 1         normal         normal Site 2                     abnormal Site 3         normal         abnormal Site 4         normal         normal Site 5         normal         abnormal Site 6         abnormal         abnormal Site 7         normal         normal Site 8         abnormal         abnormal Site 9         abnormal         abnormal Site 10         abnormal         normal  Impression      normal         normal     Habits & Providers  Alcohol-Tobacco-Diet     Alcohol drinks/day: 0     Tobacco Status: never  Exercise-Depression-Behavior     Does Patient Exercise: no     Drug Use: never  Current Medications (verified): 1)  Hydromorphone Hcl 2 Mg Tabs (Hydromorphone Hcl) .... Take One To Two Tabs Every Six Hours As Needed For Severe Pain 2)  Sulfamethoxazole-Tmp Ds 800-160 Mg Tabs (Sulfamethoxazole-Trimethoprim) .... One Tab By Mouth Twice A Day 3)  Diovan Hct 320-25 Mg Tabs (Valsartan-Hydrochlorothiazide) .... Once Daily 4)  Meloxicam 15 Mg Tabs (Meloxicam) .... One Tab By Mouth Once  Daily 5)  Hydrocodone-Acetaminophen 5-325 Mg Tabs (Hydrocodone-Acetaminophen) .... One Tab By Mouth As Needed For Pain 6)  Oxybutynin Chloride 10 Mg Xr24h-Tab (Oxybutynin Chloride) .... One Tab By Mouth Every Day 7)  Gabapentin 300 Mg Caps (Gabapentin) .... One Capsule By Mouth Three Times A Day 8)  Proventil Hfa 108 (90 Base) Mcg/act Aers (Albuterol Sulfate)  Allergies (verified): No Known Drug Allergies  Past History:  Past Surgical History: arthroscopic knee surgery (right) 2010 Cholecystectomy 2000 laser eye surgery 2010  Family History: mother - htn (deceased) father - htn (deceased) mother - lung cancer (deceased) father- lung cancer (deceased) maternal aunt - breast cancer (deceased) maternal father - leukemia (deceased) brother x 1 - diabetes, htn sister x 1 - diabetes, htn  Social History: denies any tobacco, drug or ETOH use 1 childSmoking Status:  never Drug Use:  never Does Patient Exercise:  no  Review of  Systems Eyes:  Complains of blurring. CV:  Denies chest pain or discomfort. Resp:  Denies cough. GU:  Complains of incontinence; stress incontinence - cough, sneezing exacerbates symptoms.  Wears depends. Endo:  Complains of excessive thirst and excessive urination.  Physical Exam  General:  alert.   Head:  normocephalic.   Eyes:  glasses Lungs:  normal breath sounds.   Heart:  tachycardia.   Msk:  up to the exam table Neurologic:  alert & oriented X3.    Diabetes Management Exam:    Foot Exam (with socks and/or shoes not present):       Sensory-Monofilament:          Left foot: normal          Right foot: normal   Impression & Recommendations:  Problem # 1:  HYPERTENSION, BENIGN ESSENTIAL (ICD-401.1) BP is elevated today will start clonidine by mouth two times a day with diovan DASH diet clonidine 0.1mg  by mouth x 1 given in office Her updated medication list for this problem includes:    Diovan Hct 320-25 Mg Tabs (Valsartan-hydrochlorothiazide) ..... One tablet by mouth daily    Clonidine Hcl 0.1 Mg Tabs (Clonidine hcl) ..... One tablet by mouth two times a day for blood pressure  Problem # 2:  DIABETES MELLITUS (ICD-250.00) uncontrolled will change insulin advised pt of Hgba1c and goal Her updated medication list for this problem includes:    Diovan Hct 320-25 Mg Tabs (Valsartan-hydrochlorothiazide) ..... One tablet by mouth daily    Lantus 100 Unit/ml Soln (Insulin glargine) .Marland KitchenMarland KitchenMarland KitchenMarland Kitchen 50 units at night subcutaneously for diabetes    Humulin N 100 Unit/ml Susp (Insulin isophane human) .Marland KitchenMarland KitchenMarland KitchenMarland Kitchen 10 units subcutaneously three times a day before meals  Orders: Hemoglobin A1C (83036)  Problem # 3:  CHEST PAIN (ICD-786.50) full work up done in hospital  Problem # 4:  ASTHMA (ICD-493.90) will refill meds The following medications were removed from the medication list:    Proventil Hfa 108 (90 Base) Mcg/act Aers (Albuterol sulfate) Her updated medication list for this  problem includes:    Proventil Hfa 108 (90 Base) Mcg/act Aers (Albuterol sulfate) .[name]..[name] Two inhalations every 6 hours as needed for shortness of breath    Advair Diskus 250-50 Mcg/dose Aepb (Fluticasone-salmeterol) ..... One inhalation two times a day for breathing  Problem # 5:  STRESS INCONTINENCE (ICD-788.39) kegal exercises will refill oxybutin  Complete Medication List: 1)  Diovan Hct 320-25 Mg Tabs (Valsartan-hydrochlorothiazide) .... One tablet by mouth daily 2)  Meloxicam 15 Mg Tabs (Meloxicam) .... One tab by mouth once daily 3)  Oxybutynin  Chloride 10 Mg Xr24h-tab (Oxybutynin chloride) .... One tablet by mouth daily for bladder 4)  Gabapentin 300 Mg Caps (Gabapentin) .... One capsule by mouth three times a day for feet 5)  Lantus 100 Unit/ml Soln (Insulin glargine) .... 50 units at night subcutaneously for diabetes 6)  Clonidine Hcl 0.1 Mg Tabs (Clonidine hcl) .... One tablet by mouth two times a day for blood pressure 7)  Proventil Hfa 108 (90 Base) Mcg/act Aers (Albuterol sulfate) .... Two inhalations every 6 hours as needed for shortness of breath 8)  Advair Diskus 250-50 Mcg/dose Aepb (Fluticasone-salmeterol) .... One inhalation two times a day for breathing 9)  Blood Glucose Meter Kit (Blood glucose monitoring suppl) .... Use to check blood sugar three times per day 10)  Lancets Misc (Lancets) .... Use to check blood sugar three times per day dx: insulin dependent diabetes 11)  Blood Glucose Test Strp (Glucose blood) .... Use to check blood sugar three times per day dx: insulin dependent diabetes 12)  Humulin N 100 Unit/ml Susp (Insulin isophane human) .[name].. 10 units subcutaneously three times a day before meals  CHF Assessment/Plan:      The patient's current weight is 171 pounds.  Her last cardiac ejection fraction was 65 % on 07/20/2009.    Prior Cardiac Test Results:  Echocardiogram Report:    Date:     09/27/2008    Results:   normal  Cardiac Cath Report:    Date:      07/20/2009    Results:   normal renal arteries, normal iliacs, normal coronaries    Diabetes Management Assessment/Plan:      The following lipid goals have been established for the patient: Total cholesterol goal of 200; LDL cholesterol goal of 100; HDL cholesterol goal of 40; Triglyceride goal of 150.  Her blood pressure goal is < 140/90.    Hypertension Assessment/Plan:      The patient's hypertensive risk group is category C: Target organ damage and/or diabetes.  Today's blood pressure is 189/106.  Her blood pressure goal is < 140/90.  Lipid Assessment/Plan:      Based on NCEP/ATP III, the patient's risk factor category is "history of diabetes".  The patient's lipid goals are as follows: Total cholesterol goal is 200; LDL cholesterol goal is 100; HDL cholesterol goal is 40; Triglyceride goal is 150.    Patient Instructions: 1)  Go to the bathroom as soon as you feel the urge to urinate. 2)  Do not hold your urine 3)  Avoid caffiene drinks. 4)  Kegal exercises 5)  Monitor your blood sugar three times daily before breakfast, lunch and dinner. 6)   Bring these results to this office during your next visit. 7)  Follow up in 2 weeks with n.martin,fnp for diabetes 8)  Will need 02 sat, peak flow.  Come fasting - do not eat after midnight before the appointment for labs - cmp, cmp, lipids, u/a, microalbumin, tsh, hiv. Prescriptions: PROVENTIL HFA 108 (90 BASE) MCG/ACT AERS (ALBUTEROL SULFATE) Two inhalations every 6 hours as needed for shortness of breath  #1 x 1   Entered and Authorized by:   Lehman Prom FNP   Signed by:   Lehman Prom FNP on 09/08/2009   Method used:   Faxed to ...       [facility] - Pharmac (retail)       [address] [place], [place]  [address]  Ph: [phone]       Fax: [phone]   RxID:   [id number] CLONIDINE HCL 0.1 MG TABS (CLONIDINE HCL) One tablet by mouth two times a day for blood pressure  #60 x  5   Entered and Authorized by:   Lehman Prom FNP   Signed by:   Lehman Prom FNP on 09/08/2009   Method used:   Faxed to ...       [facility] - Pharmac (retail)       [address] [place], [place]  [address]       Ph: [phone]       Fax: [phone]   RxID:   [id number] DIOVAN HCT 320-25 MG TABS (VALSARTAN-HYDROCHLOROTHIAZIDE) One tablet by mouth daily  #30 x 5   Entered and Authorized by:   Lehman Prom FNP   Signed by:   Lehman Prom FNP on 09/08/2009   Method used:   Faxed to ...       [facility] - Pharmac (retail)       [address] [place], [place]  [address]       Ph: [phone]       Fax: [phone]   RxID:   [id number] GABAPENTIN 300 MG CAPS (GABAPENTIN) One capsule by mouth three times a day for feet  #90 x 5   Entered and Authorized by:   Lehman Prom FNP   Signed by:   Lehman Prom FNP on 09/08/2009   Method used:   Faxed to ...       [facility] - Pharmac (retail)       [address] [place], [place]  [address]       Ph: [phone]       Fax: [phone]   RxID:   [id number] OXYBUTYNIN CHLORIDE 10 MG XR24H-TAB (OXYBUTYNIN CHLORIDE) One tablet by mouth daily for bladder  #30 x 5   Entered and Authorized by:   Lehman Prom FNP   Signed by:   Lehman Prom FNP on 09/08/2009   Method used:   Faxed to ...       [facility] - Pharmac (retail)       [address] [place], [place]  [address]       Ph: [phone]       Fax: [phone]   RxID:   [id number] HUMULIN N 100 UNIT/ML SUSP (INSULIN ISOPHANE HUMAN) 10 units subcutaneously three times a day before meals  #1 month qs x 1   Entered and Authorized by:   Lehman Prom FNP   Signed by:   Lehman Prom FNP on 09/08/2009   Method used:   Faxed to ...       [facility] - Pharmac (retail)        [address] [place], [place]  [address]       Ph: [phone]       Fax: [phone]   RxID:   [id number] LANTUS 100 UNIT/ML SOLN (INSULIN GLARGINE) 50 units at night subcutaneously for diabetes  #1 month qs x 1   Entered and Authorized by:   Lehman Prom FNP   Signed by:   Lehman Prom FNP on 09/08/2009   Method used:   Faxed to .Marland KitchenMarland Kitchen  [facility] - Pharmac (retail)       [address] [place], [place]  [address]       Ph: [phone]       Fax: [phone]   RxID:   [id number] BLOOD GLUCOSE TEST  STRP (GLUCOSE BLOOD) Use to check blood sugar three times per day Dx: insulin dependent diabetes  #100 x 5   Entered and Authorized by:   Lehman Prom FNP   Signed by:   Lehman Prom FNP on 09/08/2009   Method used:   Faxed to ...       [facility] - Pharmac (retail)       [address] [place], [place]  [address]       Ph: [phone]       Fax: [phone]   RxID:   [id number] LANCETS  MISC (LANCETS) Use to check blood sugar three times per day Dx: insulin dependent diabetes  #100 x 5   Entered and Authorized by:   Lehman Prom FNP   Signed by:   Lehman Prom FNP on 09/08/2009   Method used:   Faxed to ...       [facility] - Pharmac (retail)       [address] [place], [place]  [address]       Ph: [phone]       Fax: [phone]   RxID:   [id number] BLOOD GLUCOSE METER  KIT (BLOOD GLUCOSE MONITORING SUPPL) Use to check blood sugar three times per day  #1 meter x 0   Entered and Authorized by:   Lehman Prom FNP   Signed by:   Lehman Prom FNP on 09/08/2009   Method used:   Faxed to ...       [facility] - Pharmac (retail)       [address] [place], [place]  [address]       Ph: [phone]       Fax: [phone]   RxID:   [id number] ADVAIR DISKUS  250-50 MCG/DOSE AEPB (FLUTICASONE-SALMETEROL) One inhalation two times a day for breathing  #1 x 5   Entered and Authorized by:   Lehman Prom FNP   Signed by:   Lehman Prom FNP on 09/08/2009   Method used:   Faxed to ...       [facility] - Pharmac (retail)       [address] [place], [place]  [address]       Ph: [phone]       Fax: [phone]   RxID:   [id number] PROVENTIL HFA 108 (90 BASE) MCG/ACT AERS (ALBUTEROL SULFATE) Two inhalations every 6 hours as needed for shortness of breath  #1 x 1   Entered and Authorized by:   Lehman Prom FNP   Signed by:   Lehman Prom FNP on 09/08/2009   Method used:   Print then Give to Patient   RxID:   [id number] CLONIDINE HCL 0.1 MG TABS (CLONIDINE HCL) One tablet by mouth two times a day for blood pressure  #60 x 5   Entered and Authorized by:   Lehman Prom FNP   Signed by:   Lehman Prom FNP on 09/08/2009   Method used:   Print then Give to Patient  RxID:   [id number] DIOVAN HCT 320-25 MG TABS (VALSARTAN-HYDROCHLOROTHIAZIDE) One tablet by mouth daily  #30 x 5   Entered and Authorized by:   Lehman Prom FNP   Signed by:   Lehman Prom FNP on 09/08/2009   Method used:   Print then Give to Patient   RxID:   [id number]    CXR  Procedure date:  07/19/2009  Findings:      no acute findings   CXR  Procedure date:  07/19/2009  Findings:      no acute findings  Laboratory Results   Blood Tests   Date/Time Received: September 08, 2009 3:22 PM   HGBA1C: 12.3%   (Normal Range: Non-Diabetic - 3-6%   Control Diabetic - 6-8%)      Laboratory Results   Blood Tests     HGBA1C: 12.3%   (Normal Range: Non-Diabetic - 3-6%   Control Diabetic - 6-8%)

## 2010-09-26 NOTE — Progress Notes (Signed)
Summary: Med change  Phone Note Outgoing Call   Summary of Call: notify pt that [place] pharmacy is NOT able to get the Bupropion as started on last visit so i would like for her to decrease the savella to 1/2 tablet nightly for the next week (starting tonight) then go to every night until next visit the plan will be to taper her OFF the savella and start another medication for mood.  there is a risk for drug interactions so will not add the new medication while she is still taking the savella Initial call taken by: Lehman Prom FNP,  February 28, 2010 3:15 PM  Follow-up for Phone Call        Levon Hedger  February 28, 2010 4:13 PM Left message on machine for pt to return call to the office.  Levon Hedger  March 01, 2010 8:42 AM Pt informed of above information. Follow-up by: Levon Hedger,  March 01, 2010 8:42 AM

## 2010-09-26 NOTE — Assessment & Plan Note (Signed)
Summary: high blood sugar//mm  Nurse Visit   Vitals Entered By: Gaylyn Cheers RN (February 14, 2010 10:24 AM)  CC:  HA, high blood sugar, and discussed with Jesse Fall FNP.  History of Present Illness: H.A. blood sugar has been running in the 300's since last visit. last ate @ 7am, took 70/30 Novalog 15 units @ 7:15.   Review of Systems       The patient complains of headaches.      Serial Vital Signs/Assessments:  Comments: 10:51 AM CBG 470 By: Gaylyn Cheers RN   CC: HA, high blood sugar, discussed with Jesse Fall FNP CBG Result 472 CBG Device ID B Comments pt. instructed to take 70/30 Novalog mix 20Units BID, she is to call office if CBG is greater than 300. Keep F/U appt. and bring log with her. Encourage to continue drinking plenty of water. Pt. verbalized understanding. Pharm notified of change pt. gets her insulin free so she can pickup another bottle as needed.   Allergies: No Known Drug Allergies Laboratory Results   Blood Tests     CBG Random:: 472mg /dL     Medication Administration  Injection # 1:    Medication: Insulin 5 units    Diagnosis: DIABETES MELLITUS (ICD-250.00)    Route: SQ    Site: L deltoid  Orders Added: 1)  Capillary Blood Glucose/CBG [82948] 2)  Insulin 5 units [J1815] 3)  Admin of Therapeutic Inj  intramuscular or subcutaneous [96372] 4)  Est. Patient Level I [16109]   Medication Administration  Injection # 1:    Medication: Insulin 5 units    Diagnosis: DIABETES MELLITUS (ICD-250.00)    Route: SQ    Site: L deltoid  Orders Added: 1)  Capillary Blood Glucose/CBG [82948] 2)  Insulin 5 units [J1815] 3)  Admin of Therapeutic Inj  intramuscular or subcutaneous [96372] 4)  Est. Patient Level I [60454]

## 2010-09-26 NOTE — Miscellaneous (Signed)
Summary: Rehab Report/EVALUATION & TREATMENT  Rehab Report/EVALUATION & TREATMENT   Imported By: Arta Bruce 04/20/2010 10:37:49  _____________________________________________________________________  External Attachment:    Type:   Image     Comment:   External Document

## 2010-09-26 NOTE — Progress Notes (Signed)
Summary: Sugars running high  Phone Note Call from Patient   Summary of Call: pt called and said that her sugars are running high in the afternoon and at night running in the 300's and 400's.  Per Zena Amos pt should be taking 20 units of Novolog in the am and 25 units of novolog in the pm and to continue using her Lantus.  Pt is to come in for f/u on 02/28/10. Initial call taken by: Levon Hedger,  February 24, 2010 3:48 PM    New/Updated Medications: NOVOLOG MIX 70/30 70-30 % SUSP (INSULIN ASPART PROT & ASPART) 20 units subcutaneously in the morning and 20 units in the afternoon

## 2010-09-26 NOTE — Assessment & Plan Note (Signed)
Summary: Diabetes/Depression   Vital Signs:  Patient profile:   60 year old female Menstrual status:  postmenopausal Weight:      193.1 pounds BMI:     36.62 Temp:     97.9 degrees F oral Pulse rate:   73 / minute Pulse rhythm:   regular Resp:     16 per minute BP sitting:   156 / 88  (left arm) Cuff size:   regular  Vitals Entered By: Levon Hedger (February 28, 2010 11:12 AM) CC: follow-up visit DM...pt states her legs have been burning still.... she has a knot in left arm and one has jiust showed up on the left shoulder, Hypertension Management, Abdominal Pain, Depression Is Patient Diabetic? Yes Pain Assessment Patient in pain? no      CBG Result 98 CBG Device ID [id number]  Does patient need assistance? Functional Status Self care Ambulation Normal   CC:  follow-up visit DM...pt states her legs have been burning still.... she has a knot in left arm and one has jiust showed up on the left shoulder, Hypertension Management, Abdominal Pain, and Depression.  History of Present Illness:  Pt into the office for follow up on diabetes  All medications present today with pt.  Depression History:      The patient is having a depressed mood most of the day and has a diminished interest in her usual daily activities.  Positive alarm features for depression include fatigue (loss of energy).        The patient denies that she feels like life is not worth living, denies that she wishes that she were dead, and denies that she has thought about ending her life.        Comments:  pt is still taking savella and she has been taking it for at least 8 weeks.  Depression Treatment History:  Prior Medication Used:   Start Date: Assessment of Effect:   Comments:  savella     --     some improvement     continue Wellbutrin (bupropion)     02/28/2010     --       started  Diabetes Management History:      The patient is a 60 years old female who comes in for evaluation of Type 2 Diabetes  [place].  She has not been enrolled in the "Diabetic Education Program".  She states understanding of dietary principles and is following her diet appropriately.  No sensory loss is reported.  Self foot exams are not being performed.  She is checking home blood sugars.  She says that she is exercising.        Hypoglycemic symptoms are not occurring.  No hyperglycemic symptoms are reported.        Symptoms which suggest diabetic complications include paresthesias.  The following changes have been made to her treatment plan since last visit: insulin dosing.  Treatment plan changes were initiated by MD.    Dyspepsia History:      She has no alarm features of dyspepsia including no history of melena, hematochezia, dysphagia, persistent vomiting, or involuntary weight loss > 5%.  There is a prior history of GERD.  The patient does not have a prior history of documented ulcer disease.  The dominant symptom is not heartburn or acid reflux.  An H-2 blocker medication is not currently being taken.  She has no history of a positive H. Pylori serology.  No previous upper endoscopy has been done.  Hypertension History:      She denies headache, chest pain, and palpitations.  She notes no problems with any antihypertensive medication side effects.  Pt is still not taking the caduet due to it not coming from [facility] program.        Positive major cardiovascular risk factors include female age 31 years old or older, diabetes, and hypertension.  Negative major cardiovascular risk factors include non-tobacco-user status.        Further assessment for target organ damage reveals no history of ASHD, cardiac end-organ damage (CHF/LVH), stroke/TIA, peripheral vascular disease, renal insufficiency, or hypertensive retinopathy.       Medications Prior to Update: 1)  Diovan Hct 320-25 Mg Tabs (Valsartan-Hydrochlorothiazide) .... One Tablet By Mouth Daily 2)  Aleve 220 Mg Caps (Naproxen Sodium) .... 2 Tablets By Mouth Daily  For Joints 3)  Oxybutynin Chloride 10 Mg Xr24h-Tab (Oxybutynin Chloride) .... One Tablet By Mouth Daily For Bladder 4)  Lantus 100 Unit/ml Soln (Insulin Glargine) .... 80  Units At Night Subcutaneously For Diabetes *note Change in Dose** 5)  Clonidine Hcl 0.1 Mg Tabs (Clonidine Hcl) .... One Tablet By Mouth Two Times A Day For Blood Pressure 6)  Proventil Hfa 108 (90 Base) Mcg/act Aers (Albuterol Sulfate) .... Two Inhalations Every 6 Hours As Needed For Shortness of Breath 7)  Advair Diskus 250-50 Mcg/dose Aepb (Fluticasone-Salmeterol) .... One Inhalation Two Times A Day For Breathing 8)  Blood Glucose Meter  Kit (Blood Glucose Monitoring Suppl) .... Use To Check Blood Sugar Three Times Per Day 9)  Lancets  Misc (Lancets) .... Use To Check Blood Sugar Three Times Per Day Dx: Insulin Dependent Diabetes 10)  Blood Glucose Test  Strp (Glucose Blood) .... Use To Check Blood Sugar Three Times Per Day Dx: Insulin Dependent Diabetes 11)  Novolog Mix 70/30 70-30 % Susp (Insulin Aspart Prot & Aspart) .... 20 Units Subcutaneously in The Morning and 20 Units in The Afternoon 12)  Omeprazole 20 Mg Tbec (Omeprazole) .... One Capsule By Mouth Daily Before Breakfast 13)  Aspirin 81 Mg Tbec (Aspirin) .... One Tablet By Mouth Daily 14)  Caduet 10-20 Mg Tabs (Amlodipine-Atorvastatin) .... One Tablet By Mouth Nightly For Blood Pressure and Cholesterol 15)  Diclofenac Sodium 75 Mg Tbec (Diclofenac Sodium) .... One Tablet By Mouth Two Times A Day For Back 16)  Lyrica 50 Mg Caps (Pregabalin) .... One Capsule By Mouth Three Times A Day For Neuropathy 17)  Savella 50 Mg Tabs (Milnacipran Hcl) .... One Tablet By Mouth Two Times A Day 18)  Pravastatin Sodium 10 Mg Tabs (Pravastatin Sodium) .... One Tablet By Mouth Nightly For Cholesterol 19)  Fluconazole 150 Mg Tabs (Fluconazole) .... One Tablet By Mouth Daily X 3 Days  Allergies (verified): No Known Drug Allergies  Review of Systems CV:  Denies chest pain or  discomfort. Resp:  Denies cough. GI:  Denies abdominal pain, nausea, and vomiting. MS:  Complains of joint pain; neck pain. Neuro:  Complains of tingling. Endo:  Complains of excessive thirst and excessive urination.  Physical Exam  General:  alert.   Head:  normocephalic.   Eyes:  glasses Ears:  ear piercing(s) noted.   Lungs:  few scattered wheezes Heart:  normal rate and regular rhythm.   Abdomen:  soft, non-tender, and normal bowel sounds.   Msk:  up to the exam table Neurologic:  alert & oriented X3.   Skin:  left upper arm - ? lypoma approx 7cm x 7cm - tender Psych:  Oriented X3.    Diabetes Management Exam:       Nails:          Left foot: thickened          Right foot: thickened   Impression & Recommendations:  Problem # 1:  DIABETES MELLITUS (ICD-250.00) Pt's blood sugar is still elevated 3 days ago increased novolog 70/30 Her updated medication list for this problem includes:    Diovan Hct 320-25 Mg Tabs (Valsartan-hydrochlorothiazide) ..... One tablet by mouth daily    Lantus 100 Unit/ml Soln (Insulin glargine) .Marland KitchenMarland KitchenMarland KitchenMarland Kitchen 80  units at night subcutaneously for diabetes *note change in dose**    Novolog Mix 70/30 70-30 % Susp (Insulin aspart prot & aspart) .[name]... 20 units subcutaneously in the morning and 25 units in the afternoon    Aspirin 81 Mg Tbec (Aspirin) ..... One tablet by mouth daily  Orders: Capillary Blood Glucose/CBG (16109) UA Dipstick w/o Micro (manual) (60454)  Problem # 2:  HYPERTENSION, BENIGN ESSENTIAL (ICD-401.1) slightly elevated today but pt is not taking caduet yet - waiting for it to come from [facility] Her updated medication list for this problem includes:    Diovan Hct 320-25 Mg Tabs (Valsartan-hydrochlorothiazide) ..... One tablet by mouth daily    Clonidine Hcl 0.1 Mg Tabs (Clonidine hcl) ..... One tablet by mouth two times a day for blood pressure    Caduet 10-20 Mg Tabs (Amlodipine-atorvastatin) ..... One tablet by mouth nightly for blood  pressure and cholesterol  Problem # 3:  DEPRESSION (ICD-311)  pt to continue savella add wellbutrin  Her updated medication list for this problem includes:    Bupropion Hcl 150 Mg Xr12h-tab (Bupropion hcl) ..... One tablet by mouth two times a day for mood  Problem # 4:  NEUROPATHY (ICD-355.9) increase lyrica to 50mg  by mouth three times a day  likely due to uncontrolled blood sugar  Problem # 5:  LIPOMA (ICD-214.9)  Orders: Ultrasound (Ultrasound)  Complete Medication List: 1)  Diovan Hct 320-25 Mg Tabs (Valsartan-hydrochlorothiazide) .... One tablet by mouth daily 2)  Aleve 220 Mg Caps (Naproxen sodium) .... 2 tablets by mouth daily for joints 3)  Oxybutynin Chloride 10 Mg Xr24h-tab (Oxybutynin chloride) .... One tablet by mouth daily for bladder 4)  Lantus 100 Unit/ml Soln (Insulin glargine) .... 80  units at night subcutaneously for diabetes *note change in dose** 5)  Clonidine Hcl 0.1 Mg Tabs (Clonidine hcl) .... One tablet by mouth two times a day for blood pressure 6)  Proventil Hfa 108 (90 Base) Mcg/act Aers (Albuterol sulfate) .... Two inhalations every 6 hours as needed for shortness of breath 7)  Advair Diskus 250-50 Mcg/dose Aepb (Fluticasone-salmeterol) .... One inhalation two times a day for breathing 8)  Blood Glucose Meter Kit (Blood glucose monitoring suppl) .... Use to check blood sugar three times per day 9)  Lancets Misc (Lancets) .... Use to check blood sugar three times per day dx: insulin dependent diabetes 10)  Blood Glucose Test Strp (Glucose blood) .... Use to check blood sugar three times per day dx: insulin dependent diabetes 11)  Novolog Mix 70/30 70-30 % Susp (Insulin aspart prot & aspart) .... 20 units subcutaneously in the morning and 25 units in the afternoon 12)  Omeprazole 20 Mg Tbec (Omeprazole) .... One capsule by mouth daily before breakfast 13)  Aspirin 81 Mg Tbec (Aspirin) .... One tablet by mouth daily 14)  Caduet 10-20 Mg Tabs  (Amlodipine-atorvastatin) .... One tablet by mouth nightly for blood pressure and cholesterol  15)  Diclofenac Sodium 75 Mg Tbec (Diclofenac sodium) .... One tablet by mouth two times a day for back 16)  Lyrica 50 Mg Caps (Pregabalin) .... One capsule by mouth three times a day for neuropathy 17)  Savella 50 Mg Tabs (Milnacipran hcl) .... One tablet by mouth two times a day 18)  Pravastatin Sodium 10 Mg Tabs (Pravastatin sodium) .... One tablet by mouth nightly for cholesterol 19)  Bupropion Hcl 150 Mg Xr12h-tab (Bupropion hcl) .... One tablet by mouth two times a day for mood  Diabetes Management Assessment/Plan:      The following lipid goals have been established for the patient: Total cholesterol goal of 200; LDL cholesterol goal of 100; HDL cholesterol goal of 40; Triglyceride goal of 150.  Her blood pressure goal is < 140/90.    Hypertension Assessment/Plan:      The patient's hypertensive risk group is category C: Target organ damage and/or diabetes.  Her calculated 10 year risk of coronary heart disease is 15 %.  Today's blood pressure is 156/88.  Her blood pressure goal is < 140/90.  Patient Instructions: 1)  Depression - contintue Savella (you should be getting your supply from the pharmacy in the next few weeks) 2)  Start Wellburtin 150mg  by mouth two times a day (new prescription sent to the pharmacy) 3)  Diabetes - continue lantus 80 units subcutaneously nightly 4)  Keep taking the novolog 20 units in the morning and 25 units in the afternoon 5)  Blood pressure - still elevated but you have not started taking the caduet yet.  Start this medication nightly when available from the pharmacy as it will help to lower blood pressure 6)  Neuropathy - increase lyrica 50mg  to by mouth three times a day  7)  Left upper arm - will schedule ultrasound.  You will be notified of the results 8)  Follow up appointment with n.[email or web address] on the same day at appt with Aquilla Solian. 9)  will need cbg,  peak flow, o2 sat,u/a. 10)  if blood sugar is still elevated will need to increase insulin Prescriptions: BUPROPION HCL 150 MG XR12H-TAB (BUPROPION HCL) One tablet by mouth two times a day for mood  #60 x 5   Entered and Authorized by:   Lehman Prom FNP   Signed by:   Lehman Prom FNP on 02/28/2010   Method used:   Faxed to ...       [facility] (retail)       [address] [place], [place]  [address]       Ph: [phone]       Fax: [phone]   RxID:   [id number]   Laboratory Results   Urine Tests  Date/Time Received: February 28, 2010 12:17 PM   Routine Urinalysis   Color: red Glucose: 250   (Normal Range: Negative) Bilirubin: negative   (Normal Range: Negative) Ketone: trace (5)   (Normal Range: Negative) Spec. Gravity: >=1.030   (Normal Range: 1.003-1.035) Blood: negative   (Normal Range: Negative) pH: 5.0   (Normal Range: 5.0-8.0) Protein: 100   (Normal Range: Negative) Urobilinogen: 0.2   (Normal Range: 0-1) Nitrite: negative   (Normal Range: Negative) Leukocyte Esterace: negative   (Normal Range: Negative)     Blood Tests     CBG Random:: 98mg /dL       Appended Document: Diabetes/Depression    Clinical Lists Changes  Medications: Removed medication of BUPROPION  HCL 150 MG XR12H-TAB (BUPROPION HCL) One tablet by mouth two times a day for mood

## 2010-09-26 NOTE — Assessment & Plan Note (Signed)
Summary: Diabetes   Vital Signs:  Patient profile:   60 year old female Menstrual status:  postmenopausal Weight:      193.8 pounds BMI:     36.75 BSA:     1.87 O2 Sat:      97 % on Room air Temp:     97.9 degrees F oral Pulse rate:   86 / minute Pulse rhythm:   regular Resp:     20 per minute BP sitting:   150 / 78  (left arm) Cuff size:   regular  Vitals Entered By: Levon Hedger (Dec 27, 2009 9:59 AM)  O2 Flow:  Room air  Serial Vital Signs/Assessments:  Comments: p/f   270, 290, 300 By: Levon Hedger    Diabetic Foot Exam Foot Inspection Is there a history of a foot ulcer?              No Is there a foot ulcer now?              No Can the patient see the bottom of their feet?          No Are the shoes appropriate in style and fit?          Yes Is there swelling or an abnormal foot shape?          No Are the toenails long?                Yes Are the toenails thick?                Yes Are the toenails ingrown?              No Is there heavy callous build-up?              No Is there pain in the calf muscle (Intermittent claudication) when walking?    NoIs there a claw toe deformity?              No Is there elevated skin temperature?            No Is there limited ankle dorsiflexion?            No Is there foot or ankle muscle weakness?            No  Diabetic Foot Care Education Pulse Check          Right Foot          Left Foot Dorsalis Pedis:        normal            normal  CC: follow-up visit 4 week BP...x 3 weeks left leg pain radiating to back with burning in the knee and tingling sensation, Hypertension Management, Abdominal Pain, Depression Is Patient Diabetic? Yes Pain Assessment Patient in pain? yes     Location: left leg Intensity: 8 Type: burning Onset of pain  Constant CBG Result 178 CBG Device ID A  Does patient need assistance? Functional Status Self care Ambulation Normal   CC:  follow-up visit 4 week BP...x 3 weeks left leg  pain radiating to back with burning in the knee and tingling sensation, Hypertension Management, Abdominal Pain, and Depression.  History of Present Illness:  Pt into the office for diabetes f/u.  Depression History:      The patient is having a depressed mood most of the day and has a diminished interest in her usual daily activities.  Positive alarm features for depression  include insomnia, fatigue (loss of energy), and impaired concentration (indecisiveness).  However, she denies recurrent thoughts of death or suicide.        Psychosocial stress factors include major life changes.  The patient denies that she feels like life is not worth living, denies that she wishes that she were dead, and denies that she has thought about ending her life.         Diabetes Management History:      The patient is a 60 years old female who comes in for evaluation of Type 2 Diabetes Mellitus.  She has not been enrolled in the "Diabetic Education Program".  She is checking home blood sugars.  She says that she is exercising.        Her home blood sugars include fasting blood sugars: highest: 364, lowest: 100; 11:00 AM blood sugars: highest: 442, lowest: 117; 4:00 PM blood sugars: highest: 528, lowest: 151.    Dyspepsia History:      She has no alarm features of dyspepsia including no history of melena, hematochezia, dysphagia, persistent vomiting, or involuntary weight loss > 5%.  There is a prior history of GERD.  She notes that it has been less than 12 months since the last episode of GERD.  The patient does not have a prior history of documented ulcer disease.  The dominant symptom is not heartburn or acid reflux.  An H-2 blocker medication is not currently being taken.  No previous upper endoscopy has been done.    Hypertension History:      She denies headache, chest pain, and palpitations.  She notes no problems with any antihypertensive medication side effects.  pt is taking meds as ordered.         Positive major cardiovascular risk factors include female age 60 years old or older, diabetes, and hypertension.  Negative major cardiovascular risk factors include non-tobacco-user status.        Further assessment for target organ damage reveals no history of ASHD, cardiac end-organ damage (CHF/LVH), stroke/TIA, peripheral vascular disease, renal insufficiency, or hypertensive retinopathy.        di Allergies: No Known Drug Allergies  Review of Systems General:  Complains of sleep disorder. CV:  Denies chest pain or discomfort. Resp:  Denies cough. GU:  Complains of incontinence and urinary frequency; accidental epidsodes with cough and sneezing lead to incontinence. MS:  Complains of joint pain and low back pain. Neuro:  Complains of numbness and tingling; left outer thigh. Psych:  Complains of depression.  Physical Exam  General:  alert.   Eyes:  glasses Lungs:  normal breath sounds.   Heart:  normal rate and regular rhythm.   Abdomen:  normal bowel sounds.   Neurologic:  alert & oriented X3.   Skin:  color normal.   Psych:  Oriented X3.     Impression & Recommendations:  Problem # 1:  DIABETES MELLITUS (ICD-250.00) will increase insulin - needs slightly better control Her updated medication list for this problem includes:    Diovan Hct 320-25 Mg Tabs (Valsartan-hydrochlorothiazide) ..... One tablet by mouth daily    Lantus 100 Unit/ml Soln (Insulin glargine) .Marland KitchenMarland KitchenMarland KitchenMarland Kitchen 80  units at night subcutaneously for diabetes    Novolog 100 Unit/ml Soln (Insulin aspart) .[name]... 14 units subcutaneously with breakfast, 16 units subcutaneously with lunch, 12 units subcutaneously with dinner for diabetes    Aspirin 81 Mg Tbec (Aspirin) ..... One tablet by mouth daily  Orders: Capillary Blood Glucose/CBG (04540)  Problem # 2:  HYPERTENSION, BENIGN ESSENTIAL (ICD-401.1)  The following medications were removed from the medication list:    Amlodipine Besylate 10 Mg Tabs (Amlodipine  besylate) ..... One tablet by mouth daily ** take until the caudet arrives** Her updated medication list for this problem includes:    Diovan Hct 320-25 Mg Tabs (Valsartan-hydrochlorothiazide) ..... One tablet by mouth daily    Clonidine Hcl 0.1 Mg Tabs (Clonidine hcl) ..... One tablet by mouth two times a day for blood pressure    Caduet 10-20 Mg Tabs (Amlodipine-atorvastatin) ..... One tablet by mouth nightly for blood pressure and cholesterol  Problem # 3:  STRESS INCONTINENCE (ICD-788.39) advised pt to limit bladder stimulants  Problem # 4:  BACK PAIN WITH RADICULOPATHY (ICD-729.2) will given depomedrol and toradol IM in office today will start diclofenac 75mg  by mouth two times a day (with food)  Problem # 5:  ASTHMA (ICD-493.90)  Her updated medication list for this problem includes:    Proventil Hfa 108 (90 Base) Mcg/act Aers (Albuterol sulfate) .[name]..[name] Two inhalations every 6 hours as needed for shortness of breath    Advair Diskus 250-50 Mcg/dose Aepb (Fluticasone-salmeterol) ..... One inhalation two times a day for breathing  Orders: Peak Flow Rate (94150) Pulse Oximetry (single measurment) (94760)  Complete Medication List: 1)  Diovan Hct 320-25 Mg Tabs (Valsartan-hydrochlorothiazide) .... One tablet by mouth daily 2)  Aleve 220 Mg Caps (Naproxen sodium) .... 2 tablets by mouth daily for joints 3)  Oxybutynin Chloride 10 Mg Xr24h-tab (Oxybutynin chloride) .... One tablet by mouth daily for bladder 4)  Lantus 100 Unit/ml Soln (Insulin glargine) .... 80  units at night subcutaneously for diabetes 5)  Clonidine Hcl 0.1 Mg Tabs (Clonidine hcl) .... One tablet by mouth two times a day for blood pressure 6)  Proventil Hfa 108 (90 Base) Mcg/act Aers (Albuterol sulfate) .... Two inhalations every 6 hours as needed for shortness of breath 7)  Advair Diskus 250-50 Mcg/dose Aepb (Fluticasone-salmeterol) .... One inhalation two times a day for breathing 8)  Blood Glucose Meter Kit (Blood  glucose monitoring suppl) .... Use to check blood sugar three times per day 9)  Lancets Misc (Lancets) .... Use to check blood sugar three times per day dx: insulin dependent diabetes 10)  Blood Glucose Test Strp (Glucose blood) .... Use to check blood sugar three times per day dx: insulin dependent diabetes 11)  Novolog 100 Unit/ml Soln (Insulin aspart) .[name].. 14 units subcutaneously with breakfast, 16 units subcutaneously with lunch, 12 units subcutaneously with dinner for diabetes 12)  Omeprazole 20 Mg Tbec (Omeprazole) .... One capsule by mouth daily before breakfast 13)  Aspirin 81 Mg Tbec (Aspirin) .... One tablet by mouth daily 14)  Caduet 10-20 Mg Tabs (Amlodipine-atorvastatin) .... One tablet by mouth nightly for blood pressure and cholesterol 15)  Ibuprofen 800 Mg Tabs (Ibuprofen) .... One tablet by mouth two times a day as needed for back/shoulder 16)  Diclofenac Sodium 75 Mg Tbec (Diclofenac sodium) .... One tablet by mouth two times a day for back 17)  Lyrica 50 Mg Caps (Pregabalin) .... One capsule by mouth daily x 1 week then increase to two times a day 18)  Savella Titration Pack 12.5 & 25 & 50 Mg Misc (Milnacipran hcl) .... Take as directed  Other Orders: Misc. Referral (Misc. Ref) Depo- Medrol 80mg  (J1040)  Diabetes Management Assessment/Plan:      The following lipid goals have been established for the patient: Total cholesterol goal of 200; LDL cholesterol goal of 100; HDL  cholesterol goal of 40; Triglyceride goal of 150.  Her blood pressure goal is < 140/90.    Hypertension Assessment/Plan:      The patient's hypertensive risk group is category C: Target organ damage and/or diabetes.  Her calculated 10 year risk of coronary heart disease is 15 %.  Today's blood pressure is 150/78.  Her blood pressure goal is < 140/90.  Patient Instructions: 1)  Lantus - increase to 80units at night 2)  Next week - Sunday May 8th  increase you novolog to 14 in the morning, 16 at lunch and 12  at dinner. 3)  Stop neurontin 4)  Start lyrica 50mg  by mouth daily x 1 week 5)  then increase to one tablet by mouth two times a day  6)  Back pain - anti-inflammatory: diclofenac 75mg  by mouth two times a day (with food) 7)  Mood - Take savella according to titration pack 8)  Follow up in 2 weeks for medication review. Prescriptions: SAVELLA TITRATION PACK 12.5 & 25 & 50 MG MISC (MILNACIPRAN HCL) take as directed  #1 x 0   Entered and Authorized by:   Lehman Prom FNP   Signed by:   Lehman Prom FNP on 12/27/2009   Method used:   Samples Given   RxID:   [id number] LYRICA 50 MG CAPS (PREGABALIN) One capsule by mouth daily x 1 week then increase to two times a day  #21 x 0   Entered and Authorized by:   Lehman Prom FNP   Signed by:   Lehman Prom FNP on 12/27/2009   Method used:   Samples Given   RxID:   [id number] DICLOFENAC SODIUM 75 MG TBEC (DICLOFENAC SODIUM) One tablet by mouth two times a day for back  #60 x 3   Entered and Authorized by:   Lehman Prom FNP   Signed by:   Lehman Prom FNP on 12/27/2009   Method used:   Print then Give to Patient   RxID:   [id number]    Medication Administration  Injection # 1:    Medication: Depo- Medrol 80mg     Diagnosis: KNEE PAIN, BILATERAL (ICD-719.46)    Route: IM    Site: LUOQ gluteus    Exp Date: 05/2010    Lot #: [id number]    Mfr: [employer]    Patient tolerated injection without complications    Given by: Levon Hedger (Dec 27, 2009 12:36 PM)  Injection # 2:    Medication: Ketorolac-Toradol 15mg     Diagnosis: KNEE PAIN, BILATERAL (ICD-719.46)    Route: IM    Site: RUOQ gluteus    Exp Date: 8/12    Lot #: [id number]    Mfr: [facility]    Patient tolerated injection without complications    Given by: Levon Hedger (Dec 27, 2009 12:36 PM)  Orders Added: 1)  Capillary Blood Glucose/CBG [82948] 2)  Peak Flow Rate [94150] 3)  Pulse Oximetry (single measurment) [94760] 4)  Est.  Patient Level III [16606] 5)  Misc. Referral [Misc. Ref] 6)  Depo- Medrol 80mg  [J1040]

## 2010-09-26 NOTE — Assessment & Plan Note (Signed)
Summary: VOMITTING, SEVERE STOMACH PAINS, FOR 5DAYS/LR   Vital Signs:  Patient profile:   60 year old female Menstrual status:  postmenopausal Height:      61 inches Weight:      198 pounds BMI:     37.55 Temp:     98.4 degrees F oral Pulse rate:   90 / minute Pulse rhythm:   regular Resp:     18 per minute BP sitting:   158 / 100  (left arm) Cuff size:   regular  Vitals Entered By: [place] Shannon (May 02, 2010 10:50 AM) CC: pt says she is unable to take med because it feel as if her throat wants to close.. pt says she can not keep anything down....  Is Patient Diabetic? No Pain Assessment Patient in pain? no       Does patient need assistance? Functional Status Self care Ambulation Normal   CC:  pt says she is unable to take med because it feel as if her throat wants to close.. pt says she can not keep anything down.... .  History of Present Illness: Has had bad stomach ache since late last week.  Feels swollen.  Take medications and she vomits.  Feels like throat is burning.  Has a h/o esophageal dilatation years ago.  She felt somewhat like this again.  Had cholecystectomy years ago.  Had stomach pain back when she had her esoph stretched but not like this.  This is much worse.  Feels a significant amount of burning in her throat.  No fevers.  Food feels stuck in throat but not in chest.  Pain is constant.  Points to epigastrium.  Notes black tarry stools.  Has had 1-2 since last week.  Other stools have been brown.  NO bloody stools.  Vomitus is clear to yellow.  No hematemesis.  No cough, chest pain or dyspnea.    Current Medications (verified): 1)  Diovan Hct 320-25 Mg Tabs (Valsartan-Hydrochlorothiazide) .... One Tablet By Mouth Daily 2)  Aleve 220 Mg Caps (Naproxen Sodium) .... 2 Tablets By Mouth Daily For Joints 3)  Oxybutynin Chloride 5 Mg Tabs (Oxybutynin Chloride) .... One Tablet By Mouth Two Times A Day For Bladder 4)  Lantus 100 Unit/ml Soln (Insulin  Glargine) .... 20 Units Subcutaneously in The Morning and 80 Units Subcutaneously At Night 5)  Clonidine Hcl 0.1 Mg Tabs (Clonidine Hcl) .... One Tablet By Mouth Two Times A Day For Blood Pressure 6)  Proventil Hfa 108 (90 Base) Mcg/act Aers (Albuterol Sulfate) .... Two Inhalations Every 6 Hours As Needed For Shortness of Breath 7)  Advair Diskus 250-50 Mcg/dose Aepb (Fluticasone-Salmeterol) .... One Inhalation Two Times A Day For Breathing 8)  Blood Glucose Meter  Kit (Blood Glucose Monitoring Suppl) .... Use To Check Blood Sugar Three Times Per Day 9)  Lancets  Misc (Lancets) .... Use To Check Blood Sugar Three Times Per Day Dx: Insulin Dependent Diabetes 10)  Blood Glucose Test  Strp (Glucose Blood) .... Use To Check Blood Sugar Three Times Per Day Dx: Insulin Dependent Diabetes 11)  Novolog Mix 70/30 70-30 % Susp (Insulin Aspart Prot & Aspart) .... 20 Units Subcutaneously in The Morning and 25 Units in The Afternoon 12)  Omeprazole 20 Mg Tbec (Omeprazole) .... One Capsule By Mouth Daily Before Breakfast 13)  Aspirin 81 Mg Tbec (Aspirin) .... One Tablet By Mouth Daily 14)  Caduet 10-20 Mg Tabs (Amlodipine-Atorvastatin) .... One Tablet By Mouth Nightly For Blood Pressure and Cholesterol  15)  Diclofenac Sodium 75 Mg Tbec (Diclofenac Sodium) .... One Tablet By Mouth Two Times A Day For Back 16)  Lyrica 50 Mg Caps (Pregabalin) .... One Capsule By Mouth Three Times A Day For Neuropathy 17)  Savella 50 Mg Tabs (Milnacipran Hcl) .... Taper Off 18)  Vicodin 5-500 Mg Tabs (Hydrocodone-Acetaminophen) .... One Tablet By Mouth Daily As Needed For Pain 19)  Cymbalta 30 Mg Cpep (Duloxetine Hcl) .... 2 Capsules By Mouth Daily **rx By Psych** 20)  Janumet 50-500 Mg Tabs (Sitagliptin-Metformin Hcl) .... One Tablet By Mouth Two Times A Day For Diabetes  Allergies (verified): No Known Drug Allergies  Review of Systems      See HPI General:  Denies fever. CV:  Denies chest pain or discomfort and  fainting. GU:  Complains of dysuria.  Physical Exam  General:  alert, well-developed, and well-nourished.   Head:  normocephalic and atraumatic.   Eyes:  pupils equal, pupils round, pupils reactive to light, and no injection.   Mouth:  pharynx pink and moist.   Neck:  supple.   Lungs:  decreased breath sounds exp wheezes bilat  Heart:  normal rate and regular rhythm.   Abdomen:  normal bowel sounds, no rebound tenderness, and distended.  +diffuse tenderness in all quadrants  Rectal:  no hemorrhoids, normal sphincter tone, and no masses.   Neurologic:  alert & oriented X3 and cranial nerves II-XII intact.   Psych:  normally interactive.     Impression & Recommendations:  Problem # 1:  ABDOMINAL PAIN, GENERALIZED (ICD-789.07) Assessment New  with assoc nausea and vomiting check labs: CMET, CBC, Amylase, Lipase check AAS (R/o obstruction) clear liquids for now + LE on u/a ? UTI . . ..will treat send for culture   Orders: T-Comprehensive Metabolic Panel [id number]) T-CBC w/Diff [id number]) T-Amylase [id number]) T-Lipase ([id number]) T-Urinalysis ([id number]) Diagnostic X-Ray/Fluoroscopy (Diagnostic X-Ray/Flu)  Problem # 2:  DIABETES MELLITUS (ICD-250.00) typically uncontrolled hold novolog while not eating solids  Her updated medication list for this problem includes:    Diovan Hct 320-25 Mg Tabs (Valsartan-hydrochlorothiazide) ..... One tablet by mouth daily    Lantus 100 Unit/ml Soln (Insulin glargine) .[name]... 20 units subcutaneously in the morning and 80 units subcutaneously at night    Novolog Mix 70/30 70-30 % Susp (Insulin aspart prot & aspart) .[name]... 20 units subcutaneously in the morning and 25 units in the afternoon    Aspirin 81 Mg Tbec (Aspirin) ..... One tablet by mouth daily    Janumet 50-500 Mg Tabs (Sitagliptin-metformin hcl) ..... One tablet by mouth two times a day for diabetes  Problem # 3:  UTI (ICD-599.0) ? cause of symptoms  treat and send  urine for cx  Her updated medication list for this problem includes:    Oxybutynin Chloride 5 Mg Tabs (Oxybutynin chloride) ..... One tablet by mouth two times a day for bladder    Cipro 500 Mg Tabs (Ciprofloxacin hcl) .[name]... Take 1 tablet by mouth two times a day  Orders: T-Urinalysis ([id number]) T-Culture, Urine ([id number])  Problem # 4:  HYPERTENSION, BENIGN ESSENTIAL (ICD-401.1) ? up due to pain hold on changing meds  Her updated medication list for this problem includes:    Diovan Hct 320-25 Mg Tabs (Valsartan-hydrochlorothiazide) ..... One tablet by mouth daily    Clonidine Hcl 0.1 Mg Tabs (Clonidine hcl) ..... One tablet by mouth two times a day for blood pressure    Caduet 10-20 Mg Tabs (Amlodipine-atorvastatin) ..... One tablet by mouth nightly for blood pressure  and cholesterol  Complete Medication List: 1)  Diovan Hct 320-25 Mg Tabs (Valsartan-hydrochlorothiazide) .... One tablet by mouth daily 2)  Aleve 220 Mg Caps (Naproxen sodium) .... 2 tablets by mouth daily for joints 3)  Oxybutynin Chloride 5 Mg Tabs (Oxybutynin chloride) .... One tablet by mouth two times a day for bladder 4)  Lantus 100 Unit/ml Soln (Insulin glargine) .... 20 units subcutaneously in the morning and 80 units subcutaneously at night 5)  Clonidine Hcl 0.1 Mg Tabs (Clonidine hcl) .... One tablet by mouth two times a day for blood pressure 6)  Proventil Hfa 108 (90 Base) Mcg/act Aers (Albuterol sulfate) .... Two inhalations every 6 hours as needed for shortness of breath 7)  Advair Diskus 250-50 Mcg/dose Aepb (Fluticasone-salmeterol) .... One inhalation two times a day for breathing 8)  Blood Glucose Meter Kit (Blood glucose monitoring suppl) .... Use to check blood sugar three times per day 9)  Lancets Misc (Lancets) .... Use to check blood sugar three times per day dx: insulin dependent diabetes 10)  Blood Glucose Test Strp (Glucose blood) .... Use to check blood sugar three times per day dx: insulin  dependent diabetes 11)  Novolog Mix 70/30 70-30 % Susp (Insulin aspart prot & aspart) .... 20 units subcutaneously in the morning and 25 units in the afternoon 12)  Omeprazole 20 Mg Tbec (Omeprazole) .... One capsule by mouth daily before breakfast 13)  Aspirin 81 Mg Tbec (Aspirin) .... One tablet by mouth daily 14)  Caduet 10-20 Mg Tabs (Amlodipine-atorvastatin) .... One tablet by mouth nightly for blood pressure and cholesterol 15)  Diclofenac Sodium 75 Mg Tbec (Diclofenac sodium) .... One tablet by mouth two times a day for back 16)  Lyrica 50 Mg Caps (Pregabalin) .... One capsule by mouth three times a day for neuropathy 17)  Savella 50 Mg Tabs (Milnacipran hcl) .... Taper off 18)  Vicodin 5-500 Mg Tabs (Hydrocodone-acetaminophen) .... One tablet by mouth daily as needed for pain 19)  Cymbalta 30 Mg Cpep (Duloxetine hcl) .... 2 capsules by mouth daily **rx by psych** 20)  Janumet 50-500 Mg Tabs (Sitagliptin-metformin hcl) .... One tablet by mouth two times a day for diabetes 21)  Cipro 500 Mg Tabs (Ciprofloxacin hcl) .... Take 1 tablet by mouth two times a day  Patient Instructions: 1)  Follow a clear liquid diet for now.  If you can see the bottom of the glass, the liquid is ok to drink. 2)  If you symptoms improve over the next 24-48 hours, eat a bland diet: 3)  B - bananas 4)  R - rice 5)  A - apples 6)  T - toast 7)  Do this for 1-2 days and advance as tolerated. 8)  Increase omeprazole to 20 mg two times a day instead of once daily. 9)  Schedule follow up with [facility] (or [facility] if no available appts) in 2-3 days. 10)  Hold the Novolog mix while you are not eating solids. 11)  Take Cipro until all gone. 12)  You may take Tylenol (Acetaminophen) 500 mg 1-2 tabs every 6 hours as needed for pain. 13)  Please avoid Aleve and Diclofenac. 14)  Please get the xray done today. Prescriptions: CIPRO 500 MG TABS (CIPROFLOXACIN HCL) Take 1 tablet by mouth two times a day  #14 x 0   Entered  and Authorized by:   Tereso Newcomer PA-C   Signed by:   Tereso Newcomer PA-C on 05/02/2010   Method used:   Printed then  faxed to ...       [facility] (retail)       [address] [place], [place]  [address]       Ph: [phone]       Fax: [phone]   RxID:   [id number]   Laboratory Results   Urine Tests    Routine Urinalysis   Color: lt. yellow Glucose: >=1000   (Normal Range: Negative) Bilirubin: negative   (Normal Range: Negative) Ketone: negative   (Normal Range: Negative) Spec. Gravity: 1.010   (Normal Range: 1.003-1.035) Blood: negative   (Normal Range: Negative) pH: 8.0   (Normal Range: 5.0-8.0) Protein: 100   (Normal Range: Negative) Urobilinogen: 0.2   (Normal Range: 0-1) Nitrite: negative   (Normal Range: Negative) Leukocyte Esterace: trace   (Normal Range: Negative)    Date/Time Received: May 02, 2010 11:15 AM   Other Tests  Rapid Strep: negative  Stool - Occult Blood Hemmoccult #1: negative Date: 05/02/2010

## 2010-09-26 NOTE — Letter (Signed)
Summary: TEST ORDER FORM//MAMMOGRAM  TEST ORDER FORM//MAMMOGRAM   Imported By: Arta Bruce 06/14/2010 11:50:39  _____________________________________________________________________  External Attachment:    Type:   Image     Comment:   External Document

## 2010-09-26 NOTE — Progress Notes (Signed)
Summary: med for UTI  Phone Note Call from Patient   Summary of Call: Pt calling requesting med for UTI, she said you were going to call something in to the [facility] pharmacy.   Pt can be reached at [phone]. Initial call taken by: Vesta Mixer CMA,  February 08, 2010 4:05 PM  Follow-up for Phone Call        urine culture ordered - was awaiting results before med was sent to the pharmacy. perhaps will be in tomorrow Follow-up by: Lehman Prom FNP,  February 08, 2010 4:16 PM  Additional Follow-up for Phone Call Additional follow up Details #1::        pt informed. Is aware she will get a call when we receive results from lab. Additional Follow-up by: Levon Hedger,  February 08, 2010 4:37 PM

## 2010-09-26 NOTE — Letter (Signed)
Summary: *HSN Results Follow up  [facility]  [address] [place], [place] [address]   Phone: [phone]  Fax: [phone]      10/24/2009   [name] [address] [place], [place]  [address]   Dear  Ms. [name],                            ____S.Drinkard,FNP   ____D. Gore,FNP       ____B. McPherson,MD   ____V. Rankins,MD    ____E. Mulberry,MD    _X___N. Daphine Deutscher, FNP  ____D. Reche Dixon, MD    ____K. Philipp Deputy, MD    ____Other     This letter is to inform you that your recent test(s):  ___X____Pap Smear    _______Lab Test     _______X-ray    ___X____ is within acceptable limits  _______ requires a medication change  _______ requires a follow-up lab visit  _______ requires a follow-up visit with your provider   Comments: Labs done during recent office visit ok.  Pap Smear results normal.    _________________________________________________________ If you have any questions, please contact our office [phone].                    Sincerely,    Lehman Prom FNP [facility]

## 2010-09-26 NOTE — Letter (Signed)
Summary: Lipid Letter  [facility]  [address] [place], [place] [address]   Phone: [phone]  Fax: [phone]    09/23/2009  Deanie Jupiter [address] [place], [place]  [address]  Dear Sudie Bailey:  We have carefully reviewed your last lipid profile from 09/22/2009 and the results are noted below with a summary of recommendations for lipid management.    Cholesterol:       190     Goal: less than 200   HDL "good" Cholesterol:   62     Goal: greater than 40   LDL "bad" Cholesterol:   119     Goal: less than 70   Triglycerides:       44     Goal: less than 150    Labs done during your recent office visit shows that your cholesterol is slightly high.  For diabetics, your bad cholesterol should be less than 70.  This reduces your risk for heart attack and stroke. You should have been contacted by this office regarding the need to start meds.    Current Medications: 1)    Diovan Hct 320-25 Mg Tabs (Valsartan-hydrochlorothiazide) .... One tablet by mouth daily 2)    Meloxicam 15 Mg Tabs (Meloxicam) .... One tab by mouth once daily 3)    Oxybutynin Chloride 10 Mg Xr24h-tab (Oxybutynin chloride) .... One tablet by mouth daily for bladder 4)    Gabapentin 300 Mg Caps (Gabapentin) .... One capsule by mouth three times a day for feet 5)    Lantus 100 Unit/ml Soln (Insulin glargine) .... 70  units at night subcutaneously for diabetes 6)    Clonidine Hcl 0.1 Mg Tabs (Clonidine hcl) .... One tablet by mouth two times a day for blood pressure 7)    Proventil Hfa 108 (90 Base) Mcg/act Aers (Albuterol sulfate) .... Two inhalations every 6 hours as needed for shortness of breath 8)    Advair Diskus 250-50 Mcg/dose Aepb (Fluticasone-salmeterol) .... One inhalation two times a day for breathing 9)    Blood Glucose Meter  Kit (Blood glucose monitoring suppl) .... Use to check blood sugar three times per day 10)    Lancets  Misc (Lancets) .... Use to check blood sugar three  times per day dx: insulin dependent diabetes 11)    Blood Glucose Test  Strp (Glucose blood) .... Use to check blood sugar three times per day dx: insulin dependent diabetes 12)    Novolin N 100 Unit/ml Susp (Insulin isophane human) .[name].. 10 units subcutaneously three times a day with meals 13)    Omeprazole 20 Mg Tbec (Omeprazole) .... One capsule by mouth daily before breakfast 14)    Pravastatin Sodium 10 Mg Tabs (Pravastatin sodium) .... One tablet by mouth nightly for cholesterol If you have any questions, please call. We appreciate being able to work with you.  Sincerely,  [facility] Lehman Prom FNP

## 2010-09-26 NOTE — Assessment & Plan Note (Signed)
Summary: Acute Cystitis  Nurse Visit   Vital Signs:  Patient profile:   60 year old female Menstrual status:  postmenopausal Temp:     98.1 degrees F  Vitals Entered By: Gaylyn Cheers RN (March 21, 2010 11:35 AM)  CC: dysuria discussed with Jesse Fall FNP Is Patient Diabetic? Yes Nutritional Status Detail ate oatmeal with blueberries, 1/4 slice grapefruit, glass of unsweetned oj CBG Result 516 CBG Device ID presto Comments Took Diovan, Clonidine,Lantus 20U, AcipHex,Oxybutin, omeprazole, lyrica, savella this am 7:20.   Allergies: No Known Drug Allergies  CC:  dysuria discussed with Jesse Fall FNP.  History of Present Illness: Pt into the office today initially for a nurse visit but provider did go to see pt as she had elevated blood sugar with recurrent urinary symptoms. insulin increased during last visit and pt has increased insulin as per providers instruction but still no change in blood sugars complains of dysuria x's 6 days, light stains of blood on toilet tissues.  Diabetes Management History:      The patient is a 60 years old female who comes in for evaluation of Type 2 Diabetes Mellitus.  She has not been enrolled in the "Diabetic Education Program".  She states understanding of dietary principles and is following her diet appropriately.  She is checking home blood sugars.  She says that she is exercising.        Hyperglycemic symptoms include polyuria.  Other comments include: pt is given injections in her right arm - deltoid aspect not the fatty tissue on the posterior aspect.  she is also injecting under her pannus.        The following changes have been made to her treatment plan since last visit: insulin dosing.  Treatment plan changes were initiated by MD.     Impression & Recommendations:  Problem # 1:  CYSTITIS, ACUTE (ICD-595.0) recurrent likely due to diabetes will send for culture Her updated medication list for this problem includes:    Oxybutynin  Chloride 10 Mg Xr24h-tab (Oxybutynin chloride) ..... One tablet by mouth daily for bladder    Bactrim 400-80 Mg Tabs (Sulfamethoxazole-trimethoprim) ..... One tablet by mouth two times a day for infection  Problem # 2:  DIABETES MELLITUS (ICD-250.00) Blood sugar still elevated today regular insulin 20 units Subcutaneously given in office spoke with pt regarding insulin sites - she will only given insulin in abdomen until next visit 12.23mm length needles given  Her updated medication list for this problem includes:    Diovan Hct 320-25 Mg Tabs (Valsartan-hydrochlorothiazide) ..... One tablet by mouth daily    Lantus 100 Unit/ml Soln (Insulin glargine) .[name]... 20 units subcutaneously in the morning and 80 units subcutaneously at night    Novolog Mix 70/30 70-30 % Susp (Insulin aspart prot & aspart) .[name]... 20 units subcutaneously in the morning and 25 units in the afternoon    Aspirin 81 Mg Tbec (Aspirin) ..... One tablet by mouth daily  Orders: Capillary Blood Glucose/CBG ([address]) UA Dipstick w/o Micro (automated)  ([address]1003) Admin of Therapeutic Inj  intramuscular or subcutaneous ([address]) Insulin 5 units (U9811) UA Dipstick w/o Micro (manual) (91478) T-Culture, Urine ([id number])  Complete Medication List: 1)  Diovan Hct 320-25 Mg Tabs (Valsartan-hydrochlorothiazide) .... One tablet by mouth daily 2)  Aleve 220 Mg Caps (Naproxen sodium) .... 2 tablets by mouth daily for joints 3)  Oxybutynin Chloride 10 Mg Xr24h-tab (Oxybutynin chloride) .... One tablet by mouth daily for bladder 4)  Lantus 100 Unit/ml Soln (Insulin glargine) .Marland KitchenMarland KitchenMarland Kitchen  20 units subcutaneously in the morning and 80 units subcutaneously at night 5)  Clonidine Hcl 0.1 Mg Tabs (Clonidine hcl) .... One tablet by mouth two times a day for blood pressure 6)  Proventil Hfa 108 (90 Base) Mcg/act Aers (Albuterol sulfate) .... Two inhalations every 6 hours as needed for shortness of breath 7)  Advair Diskus 250-50 Mcg/dose Aepb  (Fluticasone-salmeterol) .... One inhalation two times a day for breathing 8)  Blood Glucose Meter Kit (Blood glucose monitoring suppl) .... Use to check blood sugar three times per day 9)  Lancets Misc (Lancets) .... Use to check blood sugar three times per day dx: insulin dependent diabetes 10)  Blood Glucose Test Strp (Glucose blood) .... Use to check blood sugar three times per day dx: insulin dependent diabetes 11)  Novolog Mix 70/30 70-30 % Susp (Insulin aspart prot & aspart) .... 20 units subcutaneously in the morning and 25 units in the afternoon 12)  Omeprazole 20 Mg Tbec (Omeprazole) .... One capsule by mouth daily before breakfast 13)  Aspirin 81 Mg Tbec (Aspirin) .... One tablet by mouth daily 14)  Caduet 10-20 Mg Tabs (Amlodipine-atorvastatin) .... One tablet by mouth nightly for blood pressure and cholesterol 15)  Diclofenac Sodium 75 Mg Tbec (Diclofenac sodium) .... One tablet by mouth two times a day for back 16)  Lyrica 50 Mg Caps (Pregabalin) .... One capsule by mouth three times a day for neuropathy 17)  Savella 50 Mg Tabs (Milnacipran hcl) .... One tablet by mouth two times a day 18)  Pravastatin Sodium 10 Mg Tabs (Pravastatin sodium) .... One tablet by mouth nightly for cholesterol 19)  Vicodin 5-500 Mg Tabs (Hydrocodone-acetaminophen) .... One tablet by mouth daily as needed for pain 20)  Bactrim 400-80 Mg Tabs (Sulfamethoxazole-trimethoprim) .... One tablet by mouth two times a day for infection 21)  Fluconazole 150 Mg Tabs (Fluconazole) .... One tablet by mouth x 1 dose  Diabetes Management Assessment/Plan:      The following lipid goals have been established for the patient: Total cholesterol goal of 200; LDL cholesterol goal of 100; HDL cholesterol goal of 40; Triglyceride goal of 150.  Her blood pressure goal is < 140/90.     Patient Instructions: 1)  Inject insulin only in sites as indicated on your sheet 2)  Follow up at regular scheduled visit later this  week   Review of Systems GU:  Complains of dysuria, urinary frequency, and urinary hesitancy.   Physical Exam  General:  alert.   Neurologic:  alert & oriented X3.   Skin:  color normal.   Psych:  Oriented X3.    Laboratory Results   Urine Tests  Date/Time Received: March 21, 2010 11:54 AM  Date/Time Reported: March 21, 2010 11:54 AM   Routine Urinalysis   Color: red Glucose: >=1000   (Normal Range: Negative) Bilirubin: negative   (Normal Range: Negative) Ketone: negative   (Normal Range: Negative) Spec. Gravity: <1.005   (Normal Range: 1.003-1.035) Blood: trace-intact   (Normal Range: Negative) pH: 6.5   (Normal Range: 5.0-8.0) Protein: negative   (Normal Range: Negative) Urobilinogen: 0.2   (Normal Range: 0-1) Nitrite: negative   (Normal Range: Negative) Leukocyte Esterace: trace   (Normal Range: Negative)     Blood Tests     CBG Random:: 516mg /dL     Patient Instructions: 1)  Inject insulin only in sites as indicated on your sheet 2)  Follow up at regular scheduled visit later this week  Medication Administration  Injection #  1:    Medication: Insulin 5 units    Diagnosis: DIABETES MELLITUS (ICD-250.00)    Route: SQ    Site: R deltoid    Patient tolerated injection without complications    Given by: Gaylyn Cheers RN (March 21, 2010 12:18 PM)  Orders Added: 1)  Capillary Blood Glucose/CBG [82948] 2)  UA Dipstick w/o Micro (automated)  [81003] 3)  Admin of Therapeutic Inj  intramuscular or subcutaneous [96372] 4)  Insulin 5 units [J1815] 5)  Est. Patient Level III [40981] 6)  UA Dipstick w/o Micro (manual) [81002] 7)  T-Culture, Urine [[id number]] Prescriptions: FLUCONAZOLE 150 MG TABS (FLUCONAZOLE) One tablet by mouth x 1 dose  #1 x 0   Entered and Authorized by:   Lehman Prom FNP   Signed by:   Lehman Prom FNP on 03/21/2010   Method used:   Electronically to        [facility] [phone]* (retail)       [address]        [place], [place]  [address]       Ph: [phone]       Fax: [phone]   RxID:   [id number] BACTRIM 400-80 MG TABS (SULFAMETHOXAZOLE-TRIMETHOPRIM) One tablet by mouth two times a day for infection  #20 x 0   Entered and Authorized by:   Lehman Prom FNP   Signed by:   Lehman Prom FNP on 03/21/2010   Method used:   Electronically to        [facility] [phone]* (retail)       [address]       [place], [place]  [address]       Ph: [phone]       Fax: [phone]   RxID:   [id number]   Laboratory Results   Urine Tests    Routine Urinalysis   Color: red Glucose: >=1000   (Normal Range: Negative) Bilirubin: negative   (Normal Range: Negative) Ketone: negative   (Normal Range: Negative) Spec. Gravity: <1.005   (Normal Range: 1.003-1.035) Blood: trace-intact   (Normal Range: Negative) pH: 6.5   (Normal Range: 5.0-8.0) Protein: negative   (Normal Range: Negative) Urobilinogen: 0.2   (Normal Range: 0-1) Nitrite: negative   (Normal Range: Negative) Leukocyte Esterace: trace   (Normal Range: Negative)     Blood Tests     CBG Random:: 516       Medication Administration  Injection # 1:    Medication: Insulin 5 units    Diagnosis: DIABETES MELLITUS (ICD-250.00)    Route: SQ    Site: R deltoid    Patient tolerated injection without complications    Given by: Gaylyn Cheers RN (March 21, 2010 12:18 PM)  Orders Added: 1)  Capillary Blood Glucose/CBG [82948] 2)  UA Dipstick w/o Micro (automated)  [81003] 3)  Admin of Therapeutic Inj  intramuscular or subcutaneous [96372] 4)  Insulin 5 units [J1815] 5)  Est. Patient Level III [62376] 6)  UA Dipstick w/o Micro (manual) [81002] 7)  T-Culture, Urine [[id number]]

## 2010-09-26 NOTE — Miscellaneous (Signed)
Summary: Gastric Emptying Study  Clinical Lists Changes  Observations: Added new observation of EKG INTERP: Normal solid phase gastric emptying study (06/20/2010 18:14)      EKG  Procedure date:  06/20/2010  Findings:      Normal solid phase gastric emptying study

## 2010-09-26 NOTE — Letter (Signed)
Summary: Handout Printed  Printed Handout:  - Asthma Action / Management Plan 

## 2010-09-26 NOTE — Progress Notes (Signed)
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 Levon Hedger  May 24, 2010 9:03 AM  Levon Hedger  May 24, 2010 9:06 AM Pt informed of above information. Follow-up by: Levon Hedger,  May 24, 2010 9:09 AM

## 2010-09-26 NOTE — Assessment & Plan Note (Signed)
Summary: Diabetes/HTN   Vital Signs:  Patient profile:   60 year old female Menstrual status:  postmenopausal Weight:      198.4 pounds BMI:     37.62 Temp:     98.0 degrees F oral Pulse rate:   73 / minute Pulse rhythm:   regular Resp:     16 per minute BP sitting:   110 / 74  (left arm) Cuff size:   regular  Vitals Entered By: Levon Hedger (March 23, 2010 10:05 AM) CC: follow-up visit 2 weeks, Hypertension Management, Depression Is Patient Diabetic? Yes CBG Result 342 CBG Device ID A  Does patient need assistance? Functional Status Self care Ambulation Normal   CC:  follow-up visit 2 weeks, Hypertension Management, and Depression.  History of Present Illness:  Pt into the office for diabetes f/u. Pt was seen in nurse triage earlier this week for UTI.  She was started on bactrim and this is her second day of treatment.  She was also given diflucan 150mg  by mouth x 1 dose which she has already taken.    Depression History:      The patient is having a depressed mood most of the day and has a diminished interest in her usual daily activities.  Positive alarm features for depression include fatigue (loss of energy).  However, she denies recurrent thoughts of death or suicide.        Psychosocial stress factors include major life changes.  The patient denies that she feels like life is not worth living, denies that she wishes that she were dead, and denies that she has thought about ending her life.        Comments:  Pt had an appt with amanda vaughn this morning and she is being transferred to the [facility] for medication management.  Depression Treatment History:  Prior Medication Used:   Start Date: Assessment of Effect:   Comments:  savella     --     some improvement     continue Wellbutrin (bupropion)     02/28/2010     --       not started - could not get from the pharmacy  Diabetes Management History:      The patient is a 60 years old female who comes in  for evaluation of Type 2 Diabetes Mellitus.  She has not been enrolled in the "Diabetic Education Program".  She states understanding of dietary principles and is following her diet appropriately.  Self foot exams are not being performed.  She is checking home blood sugars.  She says that she is exercising.  Type of exercise includes: walking.  Duration of exercise is estimated to be 15 min.  She is doing this 3 times per week.        Hypoglycemic symptoms are not occurring.  No hyperglycemic symptoms are reported.  Other comments include: Pt presents today with blood sugar log - she was advised to change sites to only her abdomen on last visit and already she is seeing the difference.    Hypertension History:      She denies headache, chest pain, and palpitations.  She notes no problems with any antihypertensive medication side effects.  Pt is taking meds as ordered.        Positive major cardiovascular risk factors include female age 41 years old or older, diabetes, and hypertension.  Negative major cardiovascular risk factors include non-tobacco-user status.        Further assessment  for target organ damage reveals no history of ASHD, cardiac end-organ damage (CHF/LVH), stroke/TIA, peripheral vascular disease, renal insufficiency, or hypertensive retinopathy.      Habits & Providers  Exercise-Depression-Behavior     Type of exercise: walking     Exercise (avg: min/session): 15 min.     Times/week: 3  Allergies (verified): No Known Drug Allergies  Review of Systems CV:  Denies chest pain or discomfort. Resp:  Denies cough. GI:  Denies abdominal pain, nausea, and vomiting. MS:  Complains of joint pain; left shoulder - pt took the steroid taper without relief. she has been referred to physical therapy but she will not start until 04/13/2010.  Physical Exam  General:  alert.   Head:  normocephalic.   Eyes:  glasses Lungs:  normal breath sounds.   Heart:  normal rate and regular rhythm.    Abdomen:  normal bowel sounds.    Diabetes Management Exam:    Foot Exam (with socks and/or shoes not present):       Sensory-Monofilament:          Left foot: normal          Right foot: normal   Impression & Recommendations:  Problem # 1:  DIABETES MELLITUS (ICD-250.00) Blood sugar is doing better with change of needles and site selection  pt advised to continue to do so until her next visit. Her updated medication list for this problem includes:    Diovan Hct 320-25 Mg Tabs (Valsartan-hydrochlorothiazide) ..... One tablet by mouth daily    Lantus 100 Unit/ml Soln (Insulin glargine) .[name]... 20 units subcutaneously in the morning and 80 units subcutaneously at night    Novolog Mix 70/30 70-30 % Susp (Insulin aspart prot & aspart) .[name]... 20 units subcutaneously in the morning and 25 units in the afternoon    Aspirin 81 Mg Tbec (Aspirin) ..... One tablet by mouth daily  Orders: Capillary Blood Glucose/CBG (24401)  Problem # 2:  HYPERTENSION, BENIGN ESSENTIAL (ICD-401.1) BP is doing well with the medication changes Her updated medication list for this problem includes:    Diovan Hct 320-25 Mg Tabs (Valsartan-hydrochlorothiazide) ..... One tablet by mouth daily    Clonidine Hcl 0.1 Mg Tabs (Clonidine hcl) ..... One tablet by mouth two times a day for blood pressure    Caduet 10-20 Mg Tabs (Amlodipine-atorvastatin) ..... One tablet by mouth nightly for blood pressure and cholesterol  Problem # 3:  BURSITIS, LEFT SHOULDER (ICD-726.10) pt has been referred to physical therapy which will start on August 18th  Problem # 4:  DEPRESSION (ICD-311) pt has been referred to [facility] by Aquilla Solian. She needs medication managment - limited as to what pt had access to at [facility] pharmacy.  Tried to get pt to see that mind and body have to work together  Complete Medication List: 1)  Diovan Hct 320-25 Mg Tabs (Valsartan-hydrochlorothiazide) .... One tablet by mouth daily 2)  Aleve 220 Mg  Caps (Naproxen sodium) .... 2 tablets by mouth daily for joints 3)  Oxybutynin Chloride 10 Mg Xr24h-tab (Oxybutynin chloride) .... One tablet by mouth daily for bladder 4)  Lantus 100 Unit/ml Soln (Insulin glargine) .... 20 units subcutaneously in the morning and 80 units subcutaneously at night 5)  Clonidine Hcl 0.1 Mg Tabs (Clonidine hcl) .... One tablet by mouth two times a day for blood pressure 6)  Proventil Hfa 108 (90 Base) Mcg/act Aers (Albuterol sulfate) .... Two inhalations every 6 hours as needed for shortness of breath 7)  Advair Diskus 250-50 Mcg/dose Aepb (Fluticasone-salmeterol) .... One inhalation two times a day for breathing 8)  Blood Glucose Meter Kit (Blood glucose monitoring suppl) .... Use to check blood sugar three times per day 9)  Lancets Misc (Lancets) .... Use to check blood sugar three times per day dx: insulin dependent diabetes 10)  Blood Glucose Test Strp (Glucose blood) .... Use to check blood sugar three times per day dx: insulin dependent diabetes 11)  Novolog Mix 70/30 70-30 % Susp (Insulin aspart prot & aspart) .... 20 units subcutaneously in the morning and 25 units in the afternoon 12)  Omeprazole 20 Mg Tbec (Omeprazole) .... One capsule by mouth daily before breakfast 13)  Aspirin 81 Mg Tbec (Aspirin) .... One tablet by mouth daily 14)  Caduet 10-20 Mg Tabs (Amlodipine-atorvastatin) .... One tablet by mouth nightly for blood pressure and cholesterol 15)  Diclofenac Sodium 75 Mg Tbec (Diclofenac sodium) .... One tablet by mouth two times a day for back 16)  Lyrica 50 Mg Caps (Pregabalin) .... One capsule by mouth three times a day for neuropathy 17)  Savella 50 Mg Tabs (Milnacipran hcl) .... One tablet by mouth two times a day 18)  Vicodin 5-500 Mg Tabs (Hydrocodone-acetaminophen) .... One tablet by mouth daily as needed for pain 19)  Bactrim 400-80 Mg Tabs (Sulfamethoxazole-trimethoprim) .... One tablet by mouth two times a day for infection  Diabetes  Management Assessment/Plan:      The following lipid goals have been established for the patient: Total cholesterol goal of 200; LDL cholesterol goal of 100; HDL cholesterol goal of 40; Triglyceride goal of 150.  Her blood pressure goal is < 140/90.    Hypertension Assessment/Plan:      The patient's hypertensive risk group is category C: Target organ damage and/or diabetes.  Her calculated 10 year risk of coronary heart disease is 7 %.  Today's blood pressure is 110/74.  Her blood pressure goal is < 140/90.  Patient Instructions: 1)  You have made some improvements  with your health 2)  Diabetes - Blood sugar is better already with the change in needles and sites.  Keep using the stomach sites 1-6 until you come back into this office. 3)  Blood pressure - doing much better.  4)  There are still other areas that we have to work on such as 5)  Depression/mood - this has a lot to do with how you feel.  Much like your diabetes and blood pressure you likely need a medication adjustment which the [facility] will have on site. 6)  Left shoulder - Keep your appointment with physical therapy on August 18th for your shoulder.   7)  Follow up with n.martin,fnp in 4 weeks for diabetes.  Bring your blood sugar log into this office.  Will need cbg, hgba1c, u/a. 8)  Keep your other appointment until then.  Last LDL:                                                 119 (09/22/2009 9:06:00 PM)        Diabetic Foot Exam    10-g (5.07) Semmes-Weinstein Monofilament Test Performed by: Levon Hedger          Right Foot          Left Foot Visual Inspection  Test Control      normal         normal Site 1         normal         normal Site 2         normal         normal Site 3         normal         normal Site 4         normal         normal Site 5         normal         normal Site 6         normal         normal Site 7         normal         normal Site 8         normal          normal Site 9         normal         normal Site 10         normal         normal  Impression      normal         normal

## 2010-09-26 NOTE — Assessment & Plan Note (Signed)
Summary: Diabetes/HTN   Vital Signs:  Patient profile:   61 year old female Weight:      181 pounds O2 Sat:      100 % Temp:     97.9 degrees F oral Pulse rate:   83 / minute Pulse rhythm:   regular Resp:     18 per minute BP sitting:   164 / 92  (left arm) Cuff size:   regular  Vitals Entered By: Geanie Cooley  (September 22, 2009 8:56 AM) CC: pt here for folowup on DM and Fasting Labs, pt states she has still been having headaches. Pt states her sugar still runs high at night at 200 or more., Hypertension Management Is Patient Diabetic? Yes Did you bring your meter with you today? Yes Pain Assessment Patient in pain? yes     Location: right leg Intensity: 6 Type: aching CBG Result 96  Does patient need assistance? Functional Status Self care Ambulation Normal   CC:  pt here for folowup on DM and Fasting Labs, pt states she has still been having headaches. Pt states her sugar still runs high at night at 200 or more., and Hypertension Management.  History of Present Illness:  Pt into the office for diabetes She presents today with all her medications and her blood sugar log. Initially started lantus at 50units then she noticed blood sugars was still above 200 so she increased to 60 units at bedtime. Regular - 10 units before each meal  She has taken her meds today - no food today.  Asthma History    Initial Asthma Severity Rating:    Age range: 12+ years    Symptoms: 0-2 days/week    Nighttime Awakenings: 0-2/month    Interferes w/ normal activity: no limitations    SABA use (not for EIB): 0-2 days/week    Exacerbations requiring oral systemic steroids: 0-1/year    Asthma Severity Assessment: Intermittent  Diabetes Management History:      The patient is a 60 years old female who comes in for evaluation of DM Type 2.  She has not been enrolled in the "Diabetic Education Program".  She states understanding of dietary principles and is following her diet  appropriately.  She is checking home blood sugars.  She says that she is not exercising regularly.        Hypoglycemic symptoms are not occurring.  No hyperglycemic symptoms are reported.        The following changes have been made to her treatment plan since last visit: insulin dosing.  Treatment plan changes were initiated by MD.        Her home blood sugars include fasting blood sugars: highest: 289, lowest: 88; 11:00 AM blood sugars: highest: 459, lowest: 203; 4:00 PM blood sugars: highest: 565, lowest: 263; 9:00 PM blood sugars: highest: 427, lowest: 143.    Hypertension History:      She complains of headache, but denies chest pain and palpitations.  She notes no problems with any antihypertensive medication side effects.        Positive major cardiovascular risk factors include female age 30 years old or older, diabetes, and hypertension.  Negative major cardiovascular risk factors include non-tobacco-user status.        Further assessment for target organ damage reveals no history of ASHD, cardiac end-organ damage (CHF/LVH), stroke/TIA, peripheral vascular disease, renal insufficiency, or hypertensive retinopathy.       Habits & Providers  Alcohol-Tobacco-Diet     Alcohol drinks/day: 0  Tobacco Status: never  Exercise-Depression-Behavior     Does Patient Exercise: no     Have you felt down or hopeless? no     Have you felt little pleasure in things? no     Drug Use: never  Current Medications (verified): 1)  Diovan Hct 320-25 Mg Tabs (Valsartan-Hydrochlorothiazide) .... One Tablet By Mouth Daily 2)  Meloxicam 15 Mg Tabs (Meloxicam) .... One Tab By Mouth Once Daily 3)  Oxybutynin Chloride 10 Mg Xr24h-Tab (Oxybutynin Chloride) .... One Tablet By Mouth Daily For Bladder 4)  Gabapentin 300 Mg Caps (Gabapentin) .... One Capsule By Mouth Three Times A Day For Feet 5)  Lantus 100 Unit/ml Soln (Insulin Glargine) .... 70  Units At Night Subcutaneously For Diabetes 6)  Clonidine Hcl  0.1 Mg Tabs (Clonidine Hcl) .... One Tablet By Mouth Two Times A Day For Blood Pressure 7)  Proventil Hfa 108 (90 Base) Mcg/act Aers (Albuterol Sulfate) .... Two Inhalations Every 6 Hours As Needed For Shortness of Breath 8)  Advair Diskus 250-50 Mcg/dose Aepb (Fluticasone-Salmeterol) .... One Inhalation Two Times A Day For Breathing 9)  Blood Glucose Meter  Kit (Blood Glucose Monitoring Suppl) .... Use To Check Blood Sugar Three Times Per Day 10)  Lancets  Misc (Lancets) .... Use To Check Blood Sugar Three Times Per Day Dx: Insulin Dependent Diabetes 11)  Blood Glucose Test  Strp (Glucose Blood) .... Use To Check Blood Sugar Three Times Per Day Dx: Insulin Dependent Diabetes 12)  Novolin N 100 Unit/ml Susp (Insulin Isophane Human) .[name].. 10 Units Subcutaneously Three Times A Day With Meals 13)  Omeprazole 20 Mg Tbec (Omeprazole) .... One Capsule By Mouth Daily Before Breakfast  Allergies (verified): No Known Drug Allergies  Review of Systems General:  Denies fever. ENT:  some problems swallowing veges. she does not eat a lot of meat. CV:  Complains of chest pain or discomfort. Resp:  Denies cough. GI:  Denies abdominal pain, nausea, and vomiting. Neuro:  Complains of headaches and tingling. Psych:  Denies depression.  Physical Exam  General:  alert.   Head:  normocephalic.   Eyes:  glasses Lungs:  normal breath sounds.   Heart:  normal rate and regular rhythm.   Abdomen:  normal bowel sounds.   Neurologic:  alert & oriented X3.    Diabetes Management Exam:    Foot Exam (with socks and/or shoes not present):       Sensory-Monofilament:          Left foot: normal          Right foot: normal       Nails:          Left foot: normal          Right foot: normal  Diabetic Foot Exam Foot Inspection Is there a history of a foot ulcer?              No Is there a foot ulcer now?              No Can the patient see the bottom of their feet?          Yes Are the shoes appropriate in  style and fit?          Yes Is there swelling or an abnormal foot shape?          No Are the toenails long?                No Are the toenails  thick?                No Are the toenails ingrown?              No Is there heavy callous build-up?              Yes Is there pain in the calf muscle (Intermittent claudication) when walking?    NoIs there a claw toe deformity?              No Is there elevated skin temperature?            No Is there limited ankle dorsiflexion?            No Is there foot or ankle muscle weakness?            No  Diabetic Foot Care Education Pulse Check          Right Foot          Left Foot Dorsalis Pedis:        normal            normal  High Risk Feet? Yes   10-g (5.07) Semmes-Weinstein Monofilament Test           Right Foot          Left Foot Visual Inspection                 Impression & Recommendations:  Problem # 1:  DIABETES MELLITUS (ICD-250.00) improved pt is taking her meds as ordered continue to check blood sugar Her updated medication list for this problem includes:    Diovan Hct 320-25 Mg Tabs (Valsartan-hydrochlorothiazide) ..... One tablet by mouth daily    Lantus 100 Unit/ml Soln (Insulin glargine) .Marland KitchenMarland KitchenMarland KitchenMarland Kitchen 70  units at night subcutaneously for diabetes    Novolin N 100 Unit/ml Susp (Insulin isophane human) .Marland KitchenMarland KitchenMarland KitchenMarland Kitchen 10 units subcutaneously three times a day with meals  Orders: Capillary Blood Glucose/CBG (84132) T-Lipid Profile (44010-27253) T-Comprehensive Metabolic Panel [id number]) T-CBC w/Diff (59563-87564) T-TSH (33295-18841) T-Syphilis Test (RPR) (66063-01601) T-Urine Microalbumin w/creat. ratio [phone]) UA Dipstick w/o Micro (manual) (42706)  Problem # 2:  HYPERTENSION, BENIGN ESSENTIAL (ICD-401.1) Elevated today. DASH diet. Her updated medication list for this problem includes:    Diovan Hct 320-25 Mg Tabs (Valsartan-hydrochlorothiazide) ..... One tablet by mouth daily    Clonidine Hcl 0.1 Mg Tabs (Clonidine hcl)  ..... One tablet by mouth two times a day for blood pressure  Problem # 3:  NEUROPATHY (ICD-355.9) advised pt about foot hygiene  continue neurontin  Problem # 4:  ASTHMA (ICD-493.90) stable Her updated medication list for this problem includes:    Proventil Hfa 108 (90 Base) Mcg/act Aers (Albuterol sulfate) .[name]..[name] Two inhalations every 6 hours as needed for shortness of breath    Advair Diskus 250-50 Mcg/dose Aepb (Fluticasone-salmeterol) ..... One inhalation two times a day for breathing  Orders: Peak Flow Rate (94150) Pulse Oximetry (single measurment) (94760)  Complete Medication List: 1)  Diovan Hct 320-25 Mg Tabs (Valsartan-hydrochlorothiazide) .... One tablet by mouth daily 2)  Meloxicam 15 Mg Tabs (Meloxicam) .... One tab by mouth once daily 3)  Oxybutynin Chloride 10 Mg Xr24h-tab (Oxybutynin chloride) .... One tablet by mouth daily for bladder 4)  Gabapentin 300 Mg Caps (Gabapentin) .... One capsule by mouth three times a day for feet 5)  Lantus 100 Unit/ml Soln (Insulin glargine) .... 70  units at night subcutaneously for diabetes 6)  Clonidine Hcl 0.1 Mg Tabs (Clonidine hcl) .Marland KitchenMarland KitchenMarland Kitchen  One tablet by mouth two times a day for blood pressure 7)  Proventil Hfa 108 (90 Base) Mcg/act Aers (Albuterol sulfate) .... Two inhalations every 6 hours as needed for shortness of breath 8)  Advair Diskus 250-50 Mcg/dose Aepb (Fluticasone-salmeterol) .... One inhalation two times a day for breathing 9)  Blood Glucose Meter Kit (Blood glucose monitoring suppl) .... Use to check blood sugar three times per day 10)  Lancets Misc (Lancets) .... Use to check blood sugar three times per day dx: insulin dependent diabetes 11)  Blood Glucose Test Strp (Glucose blood) .... Use to check blood sugar three times per day dx: insulin dependent diabetes 12)  Novolin N 100 Unit/ml Susp (Insulin isophane human) .[name].. 10 units subcutaneously three times a day with meals 13)  Omeprazole 20 Mg Tbec (Omeprazole) .... One  capsule by mouth daily before breakfast  Diabetes Management Assessment/Plan:      The following lipid goals have been established for the patient: Total cholesterol goal of 200; LDL cholesterol goal of 100; HDL cholesterol goal of 40; Triglyceride goal of 150.  Her blood pressure goal is < 140/90.    Hypertension Assessment/Plan:      The patient's hypertensive risk group is category C: Target organ damage and/or diabetes.  Today's blood pressure is 164/92.  Her blood pressure goal is < 140/90.  Patient Instructions: 1)  Increase lantus insulin to 70 units at night. 2)  Continue the Novolin - Regular insulin 10units three times a day  3)  Continue to check blood sugars before meals and at bedtime. 4)  Schedule an appointment in 1 month for a complete physical exam. 5)  You will need PAP, mammogram, PHQ-9, foot check. 6)  Will consider test for swallowing. 7)  Blood pressure - continue to monitor. If still up on next visit will need to increase meds 8)  Foot - use a pumise stone for your foot Prescriptions: LANTUS 100 UNIT/ML SOLN (INSULIN GLARGINE) 70  units at night subcutaneously for diabetes  #1 month qs x 5   Entered and Authorized by:   Lehman Prom FNP   Signed by:   Lehman Prom FNP on 09/22/2009   Method used:   Faxed to ...       [facility] (retail)       [address] [place], [place]  [address]       Ph: [phone]       Fax: [phone]   RxID:   [id number]    Vital Signs:  Patient profile:   60 year old female Weight:      181 pounds O2 Sat:      100 % Temp:     97.9 degrees F oral Pulse rate:   83 / minute Pulse rhythm:   regular Resp:     18 per minute BP sitting:   164 / 92  (left arm) Cuff size:   regular  Vitals Entered By: Geanie Cooley  (September 22, 2009 8:56 AM)    Serial Vital Signs/Assessments:  Time      Position  BP       Pulse  Resp  Temp     By 9:45 AM   R Arm     145/89                          Levon Hedger  Comments: 9:10 AM Peak Flows  1: 200, 2: 280, 3: 210 By: Mikey College CMA        Diabetic Foot Exam Foot Inspection Is there a history of a foot ulcer?              No Is there a foot ulcer now?              No Is there swelling or an abnormal foot shape?          No Are the toenails long?                No Are the toenails thick?                Yes Are the toenails ingrown?              Yes Is there heavy callous build-up?              No Is there a claw toe deformity?                          No Is there elevated skin temperature?            No Is there limited ankle dorsiflexion?            No Is there foot or ankle muscle weakness?            No Do you have pain in calf while walking?           No      Pulse Check          Right Foot          Left Foot Dorsalis Pedis:        normal            normal  High Risk Feet? Yes   10-g (5.07) Semmes-Weinstein Monofilament Test           Right Foot          Left Foot Visual Inspection               Test Control      normal         normal Site 1         normal         normal Site 2         normal         normal Site 3         normal         normal Site 4         normal         normal Site 5         normal         normal Site 6         normal         normal Site 7         normal         normal Site 8         normal         normal Site 9         normal         normal Site 10         normal         normal  Impression      normal         normal  Laboratory Results   Urine Tests  Date/Time Received: September 22, 2009 9:33 AM   Routine Urinalysis   Color: lt. yellow Glucose: negative   (Normal Range: Negative) Bilirubin: negative   (Normal Range: Negative) Ketone: negative   (Normal Range: Negative) Spec. Gravity: 1.010   (Normal Range: 1.003-1.035) Blood: negative   (Normal Range: Negative) pH: 7.0   (Normal Range: 5.0-8.0) Protein: negative   (Normal Range:  Negative) Urobilinogen: 0.2   (Normal Range: 0-1) Nitrite: negative   (Normal Range: Negative) Leukocyte Esterace: negative   (Normal Range: Negative)     Blood Tests     CBG Random:: 96mg /dL     Appended Document: Diabetes/HTN    Clinical Lists Changes  Observations: Added new observation of HIVRAPIDRSLT: negative (09/22/2009 13:21)      Laboratory Results    Other Tests  Rapid HIV: negative

## 2010-09-26 NOTE — Assessment & Plan Note (Signed)
Summary: Diabetes/HTN   Vital Signs:  Patient profile:   60 year old female Menstrual status:  postmenopausal Weight:      192.0 pounds BMI:     36.41 BSA:     1.86 Temp:     98.0 degrees F oral Pulse rate:   76 / minute Pulse rhythm:   regular Resp:     20 per minute BP sitting:   167 / 91  (left arm) Cuff size:   regular  Vitals Entered By: Levon Hedger (November 29, 2009 9:51 AM)  Serial Vital Signs/Assessments:  Time      Position  BP       Pulse  Resp  Temp     By           R Arm     176/107                        Levon Hedger  CC: follow-up visit DM...aching joints...burning sensation in back radiating down to legs x 1 week, Hypertension Management Is Patient Diabetic? Yes Pain Assessment Patient in pain? yes     Location: bsck,joints CBG Result 204 CBG Device ID B                   fol  Does patient need assistance? Functional Status Self care Ambulation Normal  Vision Screening:      Vision Comments: 10/2010   CC:  follow-up visit DM...aching joints...burning sensation in back radiating down to legs x 1 week and Hypertension Management.  History of Present Illness:  Pt into the office for f/u on diabetes.  Previous history of esophageal stricture -  Several years ago, about 3 years she had a procedure to stretch her esophagus.  She is currently having some of the problems as previously noted. She is trying t to chew small bites, chin touch, and drink water with foods.  Events about twice per week now and mostly with food.  no events with water or liquids  Diabetes Management History:      The patient is a 60 years old female who comes in for evaluation of Type 2 Diabetes Mellitus.  She has not been enrolled in the "Diabetic Education Program".  She states understanding of dietary principles and is following her diet appropriately.  No sensory loss is reported.  Self foot exams are not being performed.  She is checking home blood sugars.  She says that  she is exercising.        Reported hypoglycemic symptoms include sweats.  Frequency of hypoglycemic symptoms are reported to be seldom.  No hyperglycemic symptoms are reported.        Her home blood sugars include fasting blood sugars: highest: 339, lowest: 84; 11:00 AM blood sugars: highest: 343; 4:00 PM blood sugars: highest: 383.    Hypertension History:      She denies headache, chest pain, and palpitations.  She notes no problems with any antihypertensive medication side effects.  Pt has already taken her blood pressure medications today.        Positive major cardiovascular risk factors include female age 59 years old or older, diabetes, and hypertension.  Negative major cardiovascular risk factors include non-tobacco-user status.        Further assessment for target organ damage reveals no history of ASHD, cardiac end-organ damage (CHF/LVH), stroke/TIA, peripheral vascular disease, renal insufficiency, or hypertensive retinopathy.     Diabetic Foot Exam Foot Inspection  Is there a history of a foot ulcer?              No Is there a foot ulcer now?              No Can the patient see the bottom of their feet?          No Are the shoes appropriate in style and fit?          Yes Is there swelling or an abnormal foot shape?          No Are the toenails long?                Yes Are the toenails thick?                Yes Are the toenails ingrown?              No Is there heavy callous build-up?              No Is there pain in the calf muscle (Intermittent claudication) when walking?    NoIs there a claw toe deformity?              No Is there elevated skin temperature?            No Is there limited ankle dorsiflexion?            No Is there foot or ankle muscle weakness?            No  Diabetic Foot Care Education Pulse Check          Right Foot          Left Foot Dorsalis Pedis:        normal            normal    10-g (5.07) Semmes-Weinstein Monofilament Test Performed by: Levon Hedger          Right Foot          Left Foot Visual Inspection                 Habits & Providers  Alcohol-Tobacco-Diet     Alcohol drinks/day: 0     Tobacco Status: never  Exercise-Depression-Behavior     Does Patient Exercise: yes     Exercise Counseling: not indicated; exercise is adequate     Have you felt down or hopeless? no     Have you felt little pleasure in things? no     Depression Counseling: not indicated; screening negative for depression     Drug Use: never  Comments: Joined [employer] - treadmill, bicycles  Medications Prior to Update: 1)  Diovan Hct 320-25 Mg Tabs (Valsartan-Hydrochlorothiazide) .... One Tablet By Mouth Daily 2)  Aleve 220 Mg Caps (Naproxen Sodium) .... 2 Tablets By Mouth Daily For Joints 3)  Oxybutynin Chloride 10 Mg Xr24h-Tab (Oxybutynin Chloride) .... One Tablet By Mouth Daily For Bladder 4)  Gabapentin 300 Mg Caps (Gabapentin) .... One Capsule By Mouth Three Times A Day For Feet 5)  Lantus 100 Unit/ml Soln (Insulin Glargine) .... 70  Units At Night Subcutaneously For Diabetes 6)  Clonidine Hcl 0.1 Mg Tabs (Clonidine Hcl) .... One Tablet By Mouth Two Times A Day For Blood Pressure 7)  Proventil Hfa 108 (90 Base) Mcg/act Aers (Albuterol Sulfate) .... Two Inhalations Every 6 Hours As Needed For Shortness of Breath 8)  Advair Diskus 250-50 Mcg/dose Aepb (Fluticasone-Salmeterol) .... One Inhalation Two Times A  Day For Breathing 9)  Blood Glucose Meter  Kit (Blood Glucose Monitoring Suppl) .... Use To Check Blood Sugar Three Times Per Day 10)  Lancets  Misc (Lancets) .... Use To Check Blood Sugar Three Times Per Day Dx: Insulin Dependent Diabetes 11)  Blood Glucose Test  Strp (Glucose Blood) .... Use To Check Blood Sugar Three Times Per Day Dx: Insulin Dependent Diabetes 12)  Novolog 100 Unit/ml Soln (Insulin Aspart) .[name].. 12 Units Subcutaneously With Breakfast, 12 Units Subcutaneously With Lunch, 10 Units Subcutaneously With Dinner For  Diabetes 13)  Omeprazole 20 Mg Tbec (Omeprazole) .... One Capsule By Mouth Daily Before Breakfast 14)  Pravastatin Sodium 10 Mg Tabs (Pravastatin Sodium) .... One Tablet By Mouth Nightly For Cholesterol 15)  Nitrofurantoin Monohyd Macro 100 Mg Caps (Nitrofurantoin Monohyd Macro) .... One Capsule By Mouth Two Times A Day For Infection  Allergies (verified): No Known Drug Allergies  Review of Systems CV:  Denies chest pain or discomfort. Resp:  Denies cough. GI:  Denies abdominal pain, nausea, and vomiting. MS:  Complains of low back pain. Neuro:  Denies headaches.  Physical Exam  General:  alert.   Head:  normocephalic.   Eyes:  glasses Lungs:  few expiratory wheezes  Heart:  normal rate and regular rhythm.   Abdomen:  soft, non-tender, and normal bowel sounds.   Msk:  up to the exam table Neurologic:  alert & oriented X3.    Diabetes Management Exam:    Foot Exam (with socks and/or shoes not present):       Sensory-Monofilament:          Left foot: normal          Right foot: normal    Eye Exam:       Eye Exam done elsewhere          Date: 10/31/2009          Results: normal          Done by: [facility]   Impression & Recommendations:  Problem # 1:  DIABETES MELLITUS (ICD-250.00) pneumovax given today Hbga1c is 10.4 but that is improved from previous check. will titrate insulin eye exam done since last visit Her updated medication list for this problem includes:    Diovan Hct 320-25 Mg Tabs (Valsartan-hydrochlorothiazide) ..... One tablet by mouth daily    Lantus 100 Unit/ml Soln (Insulin glargine) .Marland KitchenMarland KitchenMarland KitchenMarland Kitchen 75  units at night subcutaneously for diabetes    Novolog 100 Unit/ml Soln (Insulin aspart) .[name]... 12 units subcutaneously with breakfast, 14 units subcutaneously with lunch, 10 units subcutaneously with dinner for diabetes    Aspirin 81 Mg Tbec (Aspirin) ..... One tablet by mouth daily  Orders: Hgb A1C (81191YN) Capillary Blood Glucose/CBG (82956)  Problem # 2:   HYPERTENSION, BENIGN ESSENTIAL (ICD-401.1) BP is still elevated. will add amlodipine 10mg  until caduet arrives Her updated medication list for this problem includes:    Diovan Hct 320-25 Mg Tabs (Valsartan-hydrochlorothiazide) ..... One tablet by mouth daily    Clonidine Hcl 0.1 Mg Tabs (Clonidine hcl) ..... One tablet by mouth two times a day for blood pressure    Caduet 10-20 Mg Tabs (Amlodipine-atorvastatin) ..... One tablet by mouth nightly for blood pressure and cholesterol    Amlodipine Besylate 10 Mg Tabs (Amlodipine besylate) ..... One tablet by mouth daily ** take until the caudet arrives**  Problem # 3:  ASTHMA (ICD-493.90)  Her updated medication list for this problem includes:    Proventil Hfa 108 (90 Base) Mcg/act Aers (  Albuterol sulfate) .[name]..[name] Two inhalations every 6 hours as needed for shortness of breath    Advair Diskus 250-50 Mcg/dose Aepb (Fluticasone-salmeterol) ..... One inhalation two times a day for breathing  Problem # 4:  Hx of ESOPHAGEAL STRICTURE (ICD-530.3) may need some f/u  Complete Medication List: 1)  Diovan Hct 320-25 Mg Tabs (Valsartan-hydrochlorothiazide) .... One tablet by mouth daily 2)  Aleve 220 Mg Caps (Naproxen sodium) .... 2 tablets by mouth daily for joints 3)  Oxybutynin Chloride 10 Mg Xr24h-tab (Oxybutynin chloride) .... One tablet by mouth daily for bladder 4)  Gabapentin 300 Mg Caps (Gabapentin) .... One capsule by mouth three times a day for feet 5)  Lantus 100 Unit/ml Soln (Insulin glargine) .... 75  units at night subcutaneously for diabetes 6)  Clonidine Hcl 0.1 Mg Tabs (Clonidine hcl) .... One tablet by mouth two times a day for blood pressure 7)  Proventil Hfa 108 (90 Base) Mcg/act Aers (Albuterol sulfate) .... Two inhalations every 6 hours as needed for shortness of breath 8)  Advair Diskus 250-50 Mcg/dose Aepb (Fluticasone-salmeterol) .... One inhalation two times a day for breathing 9)  Blood Glucose Meter Kit (Blood glucose  monitoring suppl) .... Use to check blood sugar three times per day 10)  Lancets Misc (Lancets) .... Use to check blood sugar three times per day dx: insulin dependent diabetes 11)  Blood Glucose Test Strp (Glucose blood) .... Use to check blood sugar three times per day dx: insulin dependent diabetes 12)  Novolog 100 Unit/ml Soln (Insulin aspart) .[name].. 12 units subcutaneously with breakfast, 14 units subcutaneously with lunch, 10 units subcutaneously with dinner for diabetes 13)  Omeprazole 20 Mg Tbec (Omeprazole) .... One capsule by mouth daily before breakfast 14)  Pravastatin Sodium 10 Mg Tabs (Pravastatin sodium) .... One tablet by mouth nightly for cholesterol 15)  Aspirin 81 Mg Tbec (Aspirin) .... One tablet by mouth daily 16)  Caduet 10-20 Mg Tabs (Amlodipine-atorvastatin) .... One tablet by mouth nightly for blood pressure and cholesterol 17)  Amlodipine Besylate 10 Mg Tabs (Amlodipine besylate) .... One tablet by mouth daily ** take until the caudet arrives** 18)  Ibuprofen 800 Mg Tabs (Ibuprofen) .... One tablet by mouth two times a day as needed for back/shoulder  Other Orders: Pneumococcal Vaccine (08657) Admin 1st Vaccine (84696) Admin 1st Vaccine [facility]) [phone])  Diabetes Management Assessment/Plan:      The following lipid goals have been established for the patient: Total cholesterol goal of 200; LDL cholesterol goal of 100; HDL cholesterol goal of 40; Triglyceride goal of 150.  Her blood pressure goal is < 140/90.    Hypertension Assessment/Plan:      The patient's hypertensive risk group is category C: Target organ damage and/or diabetes.  Her calculated 10 year risk of coronary heart disease is 17 %.  Today's blood pressure is 167/91.  Her blood pressure goal is < 140/90.  Patient Instructions: 1)  Your Hgba1c = 10.4 today 2)  The goal is less than 7 3)  The last check was 12.3 4)  Increase the Novolog insulin to 12 in the morning, 14 units before lunch and 10 units  before dinner. 5)  Your blood pressure is still elevated. 6)  Will add caduet which 10/20mg  by mouth nightly - this will have to be ordered. 7)  Until it arrives add the amlodipine 10mg  by mouth daily and continue the pravastatin 10mg  by mouth nightly 8)  Exercise - Continue to exercise it it helpful for blood pressure  and blood sugar 9)  left shoulder - apply warm compresses, take ibuprofen 800mg  by mouth two times a day x 3 days (limit weight lifting until better) 10)  Follow up in 4 weeks for diabetes and blood pressure.  Bring your blood sugar log into this office.  will need 02 sat and peak flow. Prescriptions: IBUPROFEN 800 MG TABS (IBUPROFEN) One tablet by mouth two times a day as needed for back/shoulder  #50 x 0   Entered and Authorized by:   Lehman Prom FNP   Signed by:   Lehman Prom FNP on 11/29/2009   Method used:   Print then Give to Patient   RxID:   [id number] AMLODIPINE BESYLATE 10 MG TABS (AMLODIPINE BESYLATE) One tablet by mouth daily ** take until the caudet arrives**  #30 x 1   Entered and Authorized by:   Lehman Prom FNP   Signed by:   Lehman Prom FNP on 11/29/2009   Method used:   Faxed to ...       [facility] - [facility] (retail)       [address] [place], [place]  [address]       Ph: [phone]       Fax: [phone]   RxID:   [id number] CADUET 10-20 MG TABS (AMLODIPINE-ATORVASTATIN) One tablet by mouth nightly for blood pressure and cholesterol  #30 x 5   Entered and Authorized by:   Lehman Prom FNP   Signed by:   Lehman Prom FNP on 11/29/2009   Method used:   Faxed to ...       [facility] - [facility] (retail)       [address] [place], [place]  [address]       Ph: [phone]       Fax: [phone]   RxID:   [id number] OMEPRAZOLE 20 MG TBEC (OMEPRAZOLE) One capsule by mouth daily before breakfast  #30 x 5   Entered and Authorized by:    Lehman Prom FNP   Signed by:   Lehman Prom FNP on 11/29/2009   Method used:   Faxed to ...       [facility] - Pharmac (retail)       [address] [place], [place]  [address]       Ph: [phone]       Fax: [phone]   RxID:   [id number]    Last LDL:                                                 119 (09/22/2009 9:06:00 PM)          Diabetic Foot Exam Pulse Check          Right Foot          Left Foot Dorsalis Pedis:        normal            normal    10-g (5.07) Semmes-Weinstein Monofilament Test Performed by: Levon Hedger          Right Foot          Left Foot Visual Inspection               Test  Control      normal         normal Site 1         normal         normal Site 2         normal         normal Site 3         normal         normal Site 4         abnormal         normal Site 5         normal         normal Site 6         normal         abnormal Site 7         normal         normal Site 8         normal         normal Site 9         abnormal         normal Site 10         normal         abnormal  Impression      normal         normal   Laboratory Results   Blood Tests   Date/Time Received: November 29, 2009 10:07 AM   HGBA1C: 10.2%   (Normal Range: Non-Diabetic - 3-6%   Control Diabetic - 6-8%) CBG Random:: 204     Prevention & Chronic Care Immunizations   Influenza vaccine: historical per pt  (06/27/2009)    Tetanus booster: 10/18/2009: Tdap (State)    Pneumococcal vaccine: Pneumovax  (11/29/2009)  Colorectal Screening   Hemoccult: Not documented   Hemoccult action/deferral: Ordered  (10/18/2009)   Hemoccult due: 10/18/2010    Colonoscopy: historical per pt - normal  (08/27/2005)  Other Screening   Pap smear:  Specimen Adequacy: Satisfactory for evaluation.   Interpretation/Result:Negative for intraepithelial Lesion or Malignancy.     (10/18/2009)   Pap smear action/deferral:  Ordered  (10/18/2009)   Pap smear due: 10/2010    Mammogram: historical per pt  (04/27/2009)   Smoking status: never  (11/29/2009)  Diabetes Mellitus   HgbA1C: 10.2  (11/29/2009)   HgbA1C action/deferral: Ordered  (10/18/2009)   Hemoglobin A1C due: 12/07/2009    Eye exam: normal  (10/31/2009)    Foot exam: yes  (11/29/2009)   Foot exam action/deferral: Do today   High risk foot: Yes  (09/22/2009)   Foot care education: Done  (09/08/2009)    Urine microalbumin/creatinine ratio: Not documented  Lipids   Total Cholesterol: 190  (09/22/2009)   LDL: 119  (09/22/2009)   LDL Direct: Not documented   HDL: 62  (09/22/2009)   Triglycerides: 44  (09/22/2009)  Hypertension   Last Blood Pressure: 167 / 91  (11/29/2009)   Serum creatinine: 1.02  (09/22/2009)   Serum potassium 3.9  (09/22/2009)  Self-Management Support :   Personal Goals (by the next clinic visit) :     Personal A1C goal: 7  (10/18/2009)     Personal blood pressure goal: 140/90  (10/18/2009)     Personal LDL goal: 70  (10/18/2009)    Diabetes self-management support: Not documented    Hypertension self-management support: Not documented   Nursing Instructions: Give Pneumovax today     Pneumovax Vaccine    Vaccine Type:  Pneumovax    Site: right deltoid    Mfr: Merck    Dose: 0.5 ml    Route: IM    Given by: Levon Hedger    Exp. Date: 09/22/2010    Lot #: [id number]    VIS given: 03/24/96 version given November 29, 2009.

## 2010-09-26 NOTE — Progress Notes (Signed)
Summary: Office Visit/DEPRESSION SCREENING  Office Visit/DEPRESSION SCREENING   Imported By: Arta Bruce 12/13/2009 14:47:34  _____________________________________________________________________  External Attachment:    Type:   Image     Comment:   External Document

## 2010-09-26 NOTE — Letter (Signed)
Summary: [facility]   Imported By: Arta Bruce 06/26/2010 10:08:39  _____________________________________________________________________  External Attachment:    Type:   Image     Comment:   External Document

## 2010-09-26 NOTE — Progress Notes (Signed)
Summary: Lab results  Phone Note Outgoing Call   Summary of Call: Notify pt that cholesterol is slightly elevated. Remind pt that her diabetes, blood pressure and cholesterol all work together. She will need to start pravastatin 10mg  by mouth NIGHTLY (this is a low dose) Rx sent electronically to [facility] pharmacy Initial call taken by: Lehman Prom FNP,  September 23, 2009 8:56 AM  Follow-up for Phone Call        called [phone] left message on machine for pt to return call to the office.  Levon Hedger  September 26, 2009 9:26 AM pt informed of above information. Follow-up by: Levon Hedger,  September 23, 2009 5:17 PM    New/Updated Medications: PRAVASTATIN SODIUM 10 MG TABS (PRAVASTATIN SODIUM) One tablet by mouth nightly for cholesterol Prescriptions: PRAVASTATIN SODIUM 10 MG TABS (PRAVASTATIN SODIUM) One tablet by mouth nightly for cholesterol  #30 x 2   Entered and Authorized by:   Lehman Prom FNP   Signed by:   Lehman Prom FNP on 09/23/2009   Method used:   Faxed to ...       [facility] - [facility] (retail)       [address] [place], [place]  [address]       Ph: [phone]       Fax: [phone]   RxID:   [id number]  Phone Note Outgoing Call   Summary of Call: Notify pt that cholesterol is slightly elevated. Remind pt that her diabetes, blood pressure and cholesterol all work together. She will need to start pravastatin 10mg  by mouth NIGHTLY (this is a low dose) Rx sent electronically to [facility] pharmacy Initial call taken by: Lehman Prom FNP,  September 23, 2009 8:56 AM  Follow-up for Phone Call        called [phone] left message on machine for pt to return call to the office.  Levon Hedger  September 26, 2009 9:26 AM pt informed of above information. Follow-up by: Levon Hedger,  September 23, 2009 5:17 PM    New/Updated Medications: PRAVASTATIN SODIUM 10 MG TABS (PRAVASTATIN SODIUM) One tablet by mouth  nightly for cholesterol

## 2010-09-26 NOTE — Assessment & Plan Note (Signed)
Summary: Diabetes   Vital Signs:  Patient profile:   60 year old female Menstrual status:  postmenopausal Weight:      192 pounds Temp:     98.3 degrees F Pulse rhythm:   regular Resp:     18 per minute BP sitting:   206 / 83  (right arm) Cuff size:   regular  Vitals Entered By: Vesta Mixer CMA (Jan 10, 2010 8:56 AM) CC: Skin has been burning a lot and she is getting some bruises., Hypertension Management, Depression, Back Pain Is Patient Diabetic? Yes Pain Assessment Patient in pain? yes     Location: back/legs Intensity: 6 CBG Result 325  Does patient need assistance? Ambulation Normal   CC:  Skin has been burning a lot and she is getting some bruises., Hypertension Management, Depression, and Back Pain.  History of Present Illness:  Pt into the office for f/u. Multiple med changes on last visit.  Diabetes - ate this morning at 7AM Blood sugar 261 (before breakfast) Blood sugar 325 (after breakfast in office) Pt is checking blood sugar at least 3 times per day.  Obesity - Down 1 pound since her last visit. She has started to drink more water.  Back Pain History:      The pain is located in the lower back region and does not radiate below the knees.        Other comments:  pt started on diclofenac two times a day since last visit with some improvement.    Depression History:      The patient comes in today for her second follow up visit for depression.  The patient is having a depressed mood most of the day and has a diminished interest in her usual daily activities.  Positive alarm features for depression include fatigue (loss of energy).  However, she denies recurrent thoughts of death or suicide.        The patient denies that she feels like life is not worth living, denies that she wishes that she were dead, and denies that she has thought about ending her life.        Comments:  Pt was started on savella during the last visit.  pt is tolerating well. Admits  problem is situational related to trying to find a job.  Diabetes Management History:      The patient is a 60 years old female who comes in for evaluation of Type 2 Diabetes Mellitus.  She has not been enrolled in the "Diabetic Education Program".  She states understanding of dietary principles and is following her diet appropriately.  Sensory loss is noted.  Self foot exams are not being performed.  She is checking home blood sugars.  She says that she is exercising.        Hypoglycemic symptoms are not occurring.  Other comments include: Pt was advised to increase lantus to 80 units since last visit.  She has been taking 75 units instead.        Symptoms which suggest diabetic complications include paresthesias.  The following changes have been made to her treatment plan since last visit: insulin dosing.  Treatment plan changes were initiated by MD.        Her home blood sugars include fasting blood sugars: highest: 286, lowest: 104; 11:00 AM blood sugars: highest: 315, lowest: 153; 4:00 PM blood sugars: highest: 322, lowest: 131.    Hypertension History:      She denies headache, chest pain, and palpitations.  She notes the following problems with antihypertensive medication side effects: Pt took BP meds this morning at 7AM Dry mouth.        Positive major cardiovascular risk factors include female age 54 years old or older, diabetes, and hypertension.  Negative major cardiovascular risk factors include non-tobacco-user status.        Further assessment for target organ damage reveals no history of ASHD, cardiac end-organ damage (CHF/LVH), stroke/TIA, peripheral vascular disease, renal insufficiency, or hypertensive retinopathy.       Habits & Providers  Alcohol-Tobacco-Diet     Alcohol drinks/day: 0     Tobacco Status: never  Exercise-Depression-Behavior     Does Patient Exercise: yes     Exercise Counseling: not indicated; exercise is adequate     Have you felt down or hopeless? yes      Have you felt little pleasure in things? yes     Depression Counseling: further diagnostic testing and/or other treatment is indicated     Drug Use: never  Comments: Pt has an appt with Aquilla Solian today  Current Medications (verified): 1)  Diovan Hct 320-25 Mg Tabs (Valsartan-Hydrochlorothiazide) .... One Tablet By Mouth Daily 2)  Aleve 220 Mg Caps (Naproxen Sodium) .... 2 Tablets By Mouth Daily For Joints 3)  Oxybutynin Chloride 10 Mg Xr24h-Tab (Oxybutynin Chloride) .... One Tablet By Mouth Daily For Bladder 4)  Lantus 100 Unit/ml Soln (Insulin Glargine) .... 80  Units At Night Subcutaneously For Diabetes 5)  Clonidine Hcl 0.1 Mg Tabs (Clonidine Hcl) .... One Tablet By Mouth Two Times A Day For Blood Pressure 6)  Proventil Hfa 108 (90 Base) Mcg/act Aers (Albuterol Sulfate) .... Two Inhalations Every 6 Hours As Needed For Shortness of Breath 7)  Advair Diskus 250-50 Mcg/dose Aepb (Fluticasone-Salmeterol) .... One Inhalation Two Times A Day For Breathing 8)  Blood Glucose Meter  Kit (Blood Glucose Monitoring Suppl) .... Use To Check Blood Sugar Three Times Per Day 9)  Lancets  Misc (Lancets) .... Use To Check Blood Sugar Three Times Per Day Dx: Insulin Dependent Diabetes 10)  Blood Glucose Test  Strp (Glucose Blood) .... Use To Check Blood Sugar Three Times Per Day Dx: Insulin Dependent Diabetes 11)  Novolog 100 Unit/ml Soln (Insulin Aspart) .[name].. 14 Units Subcutaneously With Breakfast, 16 Units Subcutaneously With Lunch, 12 Units Subcutaneously With Dinner For Diabetes 12)  Omeprazole 20 Mg Tbec (Omeprazole) .... One Capsule By Mouth Daily Before Breakfast 13)  Aspirin 81 Mg Tbec (Aspirin) .... One Tablet By Mouth Daily 14)  Caduet 10-20 Mg Tabs (Amlodipine-Atorvastatin) .... One Tablet By Mouth Nightly For Blood Pressure and Cholesterol 15)  Ibuprofen 800 Mg Tabs (Ibuprofen) .... One Tablet By Mouth Two Times A Day As Needed For Back/shoulder 16)  Diclofenac Sodium 75 Mg Tbec (Diclofenac  Sodium) .... One Tablet By Mouth Two Times A Day For Back 17)  Lyrica 50 Mg Caps (Pregabalin) .... One Capsule By Mouth Daily X 1 Week Then Increase To Two Times A Day 18)  Savella Titration Pack 12.5 & 25 & 50 Mg Misc (Milnacipran Hcl) .... Take As Directed 19)  Pravastatin Sodium 10 Mg Tabs (Pravastatin Sodium) .... One Tablet By Mouth Nightly For Cholesterol  Allergies (verified): No Known Drug Allergies  Review of Systems CV:  Denies chest pain or discomfort. Resp:  Denies cough. GI:  Denies constipation. GU:  Complains of dysuria; denies discharge; vaginal itching. Neuro:  Complains of tingling; bil neuropathy. Endo:  Complains of excessive urination and polyuria.  Physical  Exam  General:  alert.   Head:  normocephalic.   Eyes:  glasses Lungs:  normal breath sounds.   Heart:  normal rate and regular rhythm.   Skin:  8cm x 8cm left upper arm - ? lipoma slight tenderness  Psych:  Oriented X3.     Impression & Recommendations:  Problem # 1:  DIABETES MELLITUS (ICD-250.00) will continue to titrate meds Her updated medication list for this problem includes:    Diovan Hct 320-25 Mg Tabs (Valsartan-hydrochlorothiazide) ..... One tablet by mouth daily    Lantus 100 Unit/ml Soln (Insulin glargine) .Marland KitchenMarland KitchenMarland KitchenMarland Kitchen 80  units at night subcutaneously for diabetes    Novolog 100 Unit/ml Soln (Insulin aspart) .[name]... 16 units subcutaneously with breakfast, 18 units subcutaneously with lunch, 14 units subcutaneously with dinner for diabetes    Aspirin 81 Mg Tbec (Aspirin) ..... One tablet by mouth daily  Orders: Capillary Blood Glucose/CBG (82956) UA Dipstick w/o Micro (manual) (21308) KOH/ WET Mount [phone]) T-Culture, Urine ([id number])  Problem # 2:  HYPERTENSION, BENIGN ESSENTIAL (ICD-401.1) Bp elevated today. may need to titrate or change meds if remains elevated Her updated medication list for this problem includes:    Diovan Hct 320-25 Mg Tabs (Valsartan-hydrochlorothiazide) ..... One  tablet by mouth daily    Clonidine Hcl 0.1 Mg Tabs (Clonidine hcl) ..... One tablet by mouth two times a day for blood pressure    Caduet 10-20 Mg Tabs (Amlodipine-atorvastatin) ..... One tablet by mouth nightly for blood pressure and cholesterol  Problem # 3:  DEPRESSION (ICD-311) pt to see Aquilla Solian today will continue savella (1 month of samples given today)  Problem # 4:  BACK PAIN WITH RADICULOPATHY (ICD-729.2) may need some imaging to check dx  Problem # 5:  LIPOMA (ICD-214.9) will monitor pt has declined ultrasound at this time.  advised pt that u/s will be a means to determine if area of concern is truely a lipoma  Complete Medication List: 1)  Diovan Hct 320-25 Mg Tabs (Valsartan-hydrochlorothiazide) .... One tablet by mouth daily 2)  Aleve 220 Mg Caps (Naproxen sodium) .... 2 tablets by mouth daily for joints 3)  Oxybutynin Chloride 10 Mg Xr24h-tab (Oxybutynin chloride) .... One tablet by mouth daily for bladder 4)  Lantus 100 Unit/ml Soln (Insulin glargine) .... 80  units at night subcutaneously for diabetes 5)  Clonidine Hcl 0.1 Mg Tabs (Clonidine hcl) .... One tablet by mouth two times a day for blood pressure 6)  Proventil Hfa 108 (90 Base) Mcg/act Aers (Albuterol sulfate) .... Two inhalations every 6 hours as needed for shortness of breath 7)  Advair Diskus 250-50 Mcg/dose Aepb (Fluticasone-salmeterol) .... One inhalation two times a day for breathing 8)  Blood Glucose Meter Kit (Blood glucose monitoring suppl) .... Use to check blood sugar three times per day 9)  Lancets Misc (Lancets) .... Use to check blood sugar three times per day dx: insulin dependent diabetes 10)  Blood Glucose Test Strp (Glucose blood) .... Use to check blood sugar three times per day dx: insulin dependent diabetes 11)  Novolog 100 Unit/ml Soln (Insulin aspart) .[name].. 16 units subcutaneously with breakfast, 18 units subcutaneously with lunch, 14 units subcutaneously with dinner for diabetes 12)   Omeprazole 20 Mg Tbec (Omeprazole) .... One capsule by mouth daily before breakfast 13)  Aspirin 81 Mg Tbec (Aspirin) .... One tablet by mouth daily 14)  Caduet 10-20 Mg Tabs (Amlodipine-atorvastatin) .... One tablet by mouth nightly for blood pressure and cholesterol 15)  Diclofenac Sodium 75 Mg  Tbec (Diclofenac sodium) .... One tablet by mouth two times a day for back 16)  Lyrica 50 Mg Caps (Pregabalin) .... One capsule by mouth three times a day for neuropathy 17)  Savella 50 Mg Tabs (Milnacipran hcl) .... One tablet by mouth two times a day 18)  Pravastatin Sodium 10 Mg Tabs (Pravastatin sodium) .... One tablet by mouth nightly for cholesterol 19)  Miconazole 7 2 % Crea (Miconazole nitrate) .... Use intravaginally nightly for 7 nights  Diabetes Management Assessment/Plan:      The following lipid goals have been established for the patient: Total cholesterol goal of 200; LDL cholesterol goal of 100; HDL cholesterol goal of 40; Triglyceride goal of 150.  Her blood pressure goal is < 140/90.    Hypertension Assessment/Plan:      The patient's hypertensive risk group is category C: Target organ damage and/or diabetes.  Her calculated 10 year risk of coronary heart disease is 17 %.  Today's blood pressure is 206/83.  Her blood pressure goal is < 140/90.  Patient Instructions: 1)  Weight loss - you have lost one pound since your last visit.   2)  Continue the efforts to drink water and walk about 15-20 minutes daily. 3)  Lantus - increase to 80 units tonight and do this nightly. 4)  Sunday start Novolog 5)  Breakfast - 16 units 6)  Lunch - 18 units 7)  Dinner - 14 units 8)  Follow up with n.martin,fnp in 4 weeks for diabetes 9)  If back pain persists will need xray 10)  if blood pressure still elevated will increase clonidine  Diabetic Foot Exam Foot Inspection Is there a history of a foot ulcer?              No Is there a foot ulcer now?              No Can the patient see the bottom  of their feet?          No Are the shoes appropriate in style and fit?          Yes Is there swelling or an abnormal foot shape?          No Are the toenails long?                No Are the toenails thick?                No Are the toenails ingrown?              No Is there heavy callous build-up?              No Is there pain in the calf muscle (Intermittent claudication) when walking?    No Diabetic Foot Care Education Pulse Check          Right Foot          Left Foot Dorsalis Pedis:        normal            normal Comments: cold extremities High Risk Feet? Yes Prescriptions: CLOTRIMAZOLE 10 MG LOZG (CLOTRIMAZOLE) One intravaginally nightly  #7 x 0   Entered and Authorized by:   Dorothymae Maciver Martin FNP   Signed by:   Danel Studzinski Martin FNP on 01/10/2010   Method used:   Printed then faxed to ...       [facility] (retail)       [address]  [place], [place]  [address]       Ph: [phone]       Fax: [phone]   RxID:   [id number] SAVELLA 50 MG TABS (MILNACIPRAN HCL) One tablet by mouth two times a day  #60 x 5   Entered and Authorized by:   Lehman Prom FNP   Signed by:   Lehman Prom FNP on 01/10/2010   Method used:   Printed then faxed to ...       [facility] (retail)       [address] [place], [place]  [address]       Ph: [phone]       Fax: [phone]   RxID:   [id number] LYRICA 50 MG CAPS (PREGABALIN) One capsule by mouth three times a day for neuropathy  #90 x 5   Entered and Authorized by:   Lehman Prom FNP   Signed by:   Lehman Prom FNP on 01/10/2010   Method used:   Printed then faxed to ...       [facility] (retail)       [address] [place], [place]  [address]       Ph: [phone]       Fax: [phone]   RxID:   [id number]   Laboratory Results   Urine Tests  Date/Time  Received: Jan 10, 2010 10:20 AM   Routine Urinalysis   Color: yellow Glucose: >=1000   (Normal Range: Negative) Bilirubin: negative   (Normal Range: Negative) Ketone: negative   (Normal Range: Negative) Spec. Gravity: 1.015   (Normal Range: 1.003-1.035) Blood: trace-intact   (Normal Range: Negative) pH: 5.5   (Normal Range: 5.0-8.0) Protein: 100   (Normal Range: Negative) Urobilinogen: 0.2   (Normal Range: 0-1) Nitrite: negative   (Normal Range: Negative) Leukocyte Esterace: negative   (Normal Range: Negative)     Blood Tests     CBG Random:: 325mg /dL  Date/Time Received: Jan 10, 2010 10:22 AM   [employer] Source: vaginal WBC/hpf: 1-5 Bacteria/hpf: rare Clue cells/hpf: none Yeast/hpf: moderate Wet [place] KOH: Negative Trichomonas/hpf: none     Appended Document: Diabetes     Serial Vital Signs/Assessments:  Time      Position  BP       Pulse  Resp  Temp     By           R Arm     172/100                        Levon Hedger           L Arm     188/110                        Levon Hedger   Allergies: No Known Drug Allergies   Complete Medication List: 1)  Diovan Hct 320-25 Mg Tabs (Valsartan-hydrochlorothiazide) .... One tablet by mouth daily 2)  Aleve 220 Mg Caps (Naproxen sodium) .... 2 tablets by mouth daily for joints 3)  Oxybutynin Chloride 10 Mg Xr24h-tab (Oxybutynin chloride) .... One tablet by mouth daily for bladder 4)  Lantus 100 Unit/ml Soln (Insulin glargine) .... 80  units at night subcutaneously for diabetes 5)  Clonidine Hcl 0.1 Mg Tabs (Clonidine hcl) .... One tablet by mouth two times a day for blood pressure 6)  Proventil Hfa 108 (90 Base) Mcg/act Aers (Albuterol sulfate) .... Two inhalations every 6 hours as needed for shortness of breath 7)  Advair Diskus 250-50 Mcg/dose Aepb (Fluticasone-salmeterol) .... One inhalation two times a day for breathing 8)  Blood Glucose Meter Kit (Blood glucose monitoring suppl) .... Use to check  blood sugar three times per day 9)  Lancets Misc (Lancets) .... Use to check blood sugar three times per day dx: insulin dependent diabetes 10)  Blood Glucose Test Strp (Glucose blood) .... Use to check blood sugar three times per day dx: insulin dependent diabetes 11)  Novolog 100 Unit/ml Soln (Insulin aspart) .[name].. 16 units subcutaneously with breakfast, 18 units subcutaneously with lunch, 14 units subcutaneously with dinner for diabetes 12)  Omeprazole 20 Mg Tbec (Omeprazole) .... One capsule by mouth daily before breakfast 13)  Aspirin 81 Mg Tbec (Aspirin) .... One tablet by mouth daily 14)  Caduet 10-20 Mg Tabs (Amlodipine-atorvastatin) .... One tablet by mouth nightly for blood pressure and cholesterol 15)  Diclofenac Sodium 75 Mg Tbec (Diclofenac sodium) .... One tablet by mouth two times a day for back 16)  Lyrica 50 Mg Caps (Pregabalin) .... One capsule by mouth three times a day for neuropathy 17)  Savella 50 Mg Tabs (Milnacipran hcl) .... One tablet by mouth two times a day 18)  Pravastatin Sodium 10 Mg Tabs (Pravastatin sodium) .... One tablet by mouth nightly for cholesterol 19)  Miconazole 7 2 % Crea (Miconazole nitrate) .... Use intravaginally nightly for 7 nights

## 2010-09-26 NOTE — Progress Notes (Signed)
Summary: Low blood sugars  Phone Note Outgoing Call   Summary of Call: called pt to discuss low blood sugars. when she returns the call find out what her blood sugars have been .[name]... especially in the morning She can continue lantus at 70 units nightly. will see if the pharmacy can get novolog insulin (which may be better for pt) 12 units in the morning, 12 units with lunch and 10 units with dinner.  Initial call taken by: Lehman Prom FNP,  October 27, 2009 7:53 AM  Follow-up for Phone Call        spoke with pt and she gave her BS readings  sunday 369 (F) monday 238 (F) tuesday 198 (F) wednesday 369 (F) thursday 273 (F) told pt that I would get this to provider. Brenda Craddock  October 28, 2009 9:08 AM      Additional Follow-up for Phone Call Additional follow up Details #1::        Brenda Craddock  November 02, 2009 9:09 AM pt informed.    New/Updated Medications: NOVOLOG 100 UNIT/ML SOLN (INSULIN ASPART) 12 units subcutaneously with breakfast, 12 units subcutaneously with lunch, 10 units subcutaneously with dinner for diabetes Prescriptions: NOVOLOG 100 UNIT/ML SOLN (INSULIN ASPART) 12 units subcutaneously with breakfast, 12 units subcutaneously with lunch, 10 units subcutaneously with dinner for diabetes  #1 month x 5   Entered and Authorized by:   Payne Garske Martin FNP   Signed by:   Yogesh Cominsky Martin FNP on 10/27/2009   Method used:   Faxed to ...       [facility] (retail)       [address] [place], [place]  [address]       Ph: [phone]       Fax: [phone]   RxID:   [id number]

## 2010-09-26 NOTE — Letter (Signed)
Summary: REFERRAL//PHYSICAL THERAPY//APPT DATE & TIME  REFERRAL//PHYSICAL THERAPY//APPT DATE & TIME   Imported By: Arta Bruce 03/10/2010 11:44:02  _____________________________________________________________________  External Attachment:    Type:   Image     Comment:   External Document

## 2010-09-26 NOTE — Letter (Signed)
Summary: NUTRITION SUMMARY//SUSIE  NUTRITION SUMMARY//SUSIE   Imported By: Arta Bruce 01/11/2010 16:00:03  _____________________________________________________________________  External Attachment:    Type:   Image     Comment:   External Document

## 2010-09-26 NOTE — Miscellaneous (Signed)
Summary: Insulin  Phone Note Outgoing Call   Summary of Call: When pt return call - inform her New insulin (lantus) pt will need to take tax information to the pharmacy so they can get this medication from the company for her.  She can continue her current insulin regimen until she gets BOTH the new insulins. Initial call taken by: Lehman Prom FNP,  September 09, 2009 2:57 PM  Follow-up for Phone Call        pt informed.  Follow-up by: Levon Hedger,  September 13, 2009 3:11 PM    New/Updated Medications: NOVOLIN N 100 UNIT/ML SUSP (INSULIN ISOPHANE HUMAN) 10 units subcutaneously three times a day with meals Clinical Lists Changes  Medications: Changed medication from HUMULIN N 100 UNIT/ML SUSP (INSULIN ISOPHANE HUMAN) 10 units subcutaneously three times a day before meals to NOVOLIN N 100 UNIT/ML SUSP (INSULIN ISOPHANE HUMAN) 10 units subcutaneously three times a day with meals

## 2010-09-26 NOTE — Letter (Signed)
Summary: [facility]  [facility]   Imported By: Arta Bruce 07/18/2010 10:45:34  _____________________________________________________________________  External Attachment:    Type:   Image     Comment:   External Document

## 2010-09-26 NOTE — Assessment & Plan Note (Signed)
Summary: Diabetes/HTN   Vital Signs:  Patient profile:   60 year old female Menstrual status:  postmenopausal Weight:      201.2 pounds BMI:     38.15 Temp:     97.1 degrees F oral Pulse rate:   84 / minute Pulse rhythm:   regular Resp:     12 per minute BP sitting:   132 / 82  (left arm) Cuff size:   regular  Vitals Entered By: Levon Hedger (June 13, 2010 10:07 AM)  Nutrition Counseling: Patient's BMI is greater than 25 and therefore counseled on weight management options. CC: follow-up visit on DM...went to see Dr.Mann  yesterday...she state she is breathing alot better, Hypertension Management, Lipid Management, Abdominal Pain Is Patient Diabetic? Yes Pain Assessment Patient in pain? no      CBG Result 318 CBG Device ID [id number]  Does patient need assistance? Functional Status Self care Ambulation Normal   CC:  follow-up visit on DM...went to see Dr.Mann  yesterday...she state she is breathing alot better, Hypertension Management, Lipid Management, and Abdominal Pain.  History of Present Illness:  Pt into the office for diabetes f/u.  Pt presents today with all her medications.    Diabetes Management History:      The patient is a 60 years old female who comes in for evaluation of Type 2 Diabetes Mellitus.  She is (or has been) enrolled in the "Diabetic Education Program".  She states understanding of dietary principles and is following her diet appropriately.  No sensory loss is reported.  Self foot exams are not being performed.  She is checking home blood sugars.  She says that she is exercising.  Type of exercise includes: walking.  Duration of exercise is estimated to be 15 min.  She is doing this 3 times per week.        Hypoglycemic symptoms are not occurring.  No hyperglycemic symptoms are reported.        No changes have been made to her treatment plan since last visit.    Dyspepsia History:      She has no alarm features of dyspepsia including no history  of melena, hematochezia, dysphagia, persistent vomiting, or involuntary weight loss > 5%.  There is a prior history of GERD.  The patient does not have a prior history of documented ulcer disease.  The dominant symptom is heartburn or acid reflux.  An H-2 blocker medication is not currently being taken.    Hypertension History:      She denies headache, chest pain, and palpitations.  She notes no problems with any antihypertensive medication side effects.        Positive major cardiovascular risk factors include female age 53 years old or older, diabetes, and hypertension.  Negative major cardiovascular risk factors include non-tobacco-user status.        Further assessment for target organ damage reveals no history of ASHD, cardiac end-organ damage (CHF/LVH), stroke/TIA, peripheral vascular disease, renal insufficiency, or hypertensive retinopathy.    Lipid Management History:      Positive NCEP/ATP III risk factors include female age 76 years old or older, diabetes, and hypertension.  Negative NCEP/ATP III risk factors include HDL cholesterol greater than 60, non-tobacco-user status, no ASHD (atherosclerotic heart disease), no prior stroke/TIA, and no peripheral vascular disease.      Medications Prior to Update: 1)  Diovan Hct 320-25 Mg Tabs (Valsartan-Hydrochlorothiazide) .... One Tablet By Mouth Daily 2)  Aleve 220 Mg Caps (Naproxen Sodium) .Marland KitchenMarland KitchenMarland Kitchen  2 Tablets By Mouth Daily For Joints 3)  Oxybutynin Chloride 5 Mg Tabs (Oxybutynin Chloride) .... One Tablet By Mouth Two Times A Day For Bladder 4)  Lantus 100 Unit/ml Soln (Insulin Glargine) .... 20 Units Subcutaneously in The Morning and 80 Units Subcutaneously At Night 5)  Clonidine Hcl 0.1 Mg Tabs (Clonidine Hcl) .... One Tablet By Mouth Two Times A Day For Blood Pressure 6)  Proventil Hfa 108 (90 Base) Mcg/act Aers (Albuterol Sulfate) .... Two Inhalations Every 6 Hours As Needed For Shortness of Breath 7)  Advair Diskus 250-50 Mcg/dose Aepb  (Fluticasone-Salmeterol) .... One Inhalation Two Times A Day For Breathing 8)  Blood Glucose Meter  Kit (Blood Glucose Monitoring Suppl) .... Use To Check Blood Sugar Three Times Per Day 9)  Lancets  Misc (Lancets) .... Use To Check Blood Sugar Three Times Per Day Dx: Insulin Dependent Diabetes 10)  Blood Glucose Test  Strp (Glucose Blood) .... Use To Check Blood Sugar Three Times Per Day Dx: Insulin Dependent Diabetes 11)  Novolog Mix 70/30 70-30 % Susp (Insulin Aspart Prot & Aspart) .... 20 Units Subcutaneously in The Morning and 25 Units in The Afternoon 12)  Omeprazole 20 Mg Tbec (Omeprazole) .... One Capsule By Mouth Daily Before Breakfast 13)  Aspirin 81 Mg Tbec (Aspirin) .... One Tablet By Mouth Daily 14)  Caduet 10-20 Mg Tabs (Amlodipine-Atorvastatin) .... One Tablet By Mouth Nightly For Blood Pressure and Cholesterol 15)  Diclofenac Sodium 75 Mg Tbec (Diclofenac Sodium) .... One Tablet By Mouth Two Times A Day For Back 16)  Lyrica 50 Mg Caps (Pregabalin) .... One Capsule By Mouth Three Times A Day For Neuropathy 17)  Vicodin 5-500 Mg Tabs (Hydrocodone-Acetaminophen) .... One Tablet By Mouth Daily As Needed For Pain 18)  Cymbalta 30 Mg Cpep (Duloxetine Hcl) .... 2 Capsules By Mouth Daily **rx By Psych** 19)  Janumet 50-500 Mg Tabs (Sitagliptin-Metformin Hcl) .... One Tablet By Mouth Two Times A Day For Diabetes  Current Medications (verified): 1)  Diovan Hct 320-25 Mg Tabs (Valsartan-Hydrochlorothiazide) .... One Tablet By Mouth Daily 2)  Aleve 220 Mg Caps (Naproxen Sodium) .... 2 Tablets By Mouth Daily For Joints 3)  Oxybutynin Chloride 5 Mg Tabs (Oxybutynin Chloride) .... One Tablet By Mouth Two Times A Day For Bladder 4)  Lantus 100 Unit/ml Soln (Insulin Glargine) .... 20 Units Subcutaneously in The Morning and 80 Units Subcutaneously At Night 5)  Clonidine Hcl 0.1 Mg Tabs (Clonidine Hcl) .... One Tablet By Mouth Two Times A Day For Blood Pressure 6)  Proventil Hfa 108 (90 Base)  Mcg/act Aers (Albuterol Sulfate) .... Two Inhalations Every 6 Hours As Needed For Shortness of Breath 7)  Advair Diskus 250-50 Mcg/dose Aepb (Fluticasone-Salmeterol) .... One Inhalation Two Times A Day For Breathing 8)  Blood Glucose Meter  Kit (Blood Glucose Monitoring Suppl) .... Use To Check Blood Sugar Three Times Per Day 9)  Lancets  Misc (Lancets) .... Use To Check Blood Sugar Three Times Per Day Dx: Insulin Dependent Diabetes 10)  Blood Glucose Test  Strp (Glucose Blood) .... Use To Check Blood Sugar Three Times Per Day Dx: Insulin Dependent Diabetes 11)  Novolog Mix 70/30 70-30 % Susp (Insulin Aspart Prot & Aspart) .... 20 Units Subcutaneously in The Morning and 25 Units in The Afternoon 12)  Omeprazole 20 Mg Tbec (Omeprazole) .... One Capsule By Mouth Daily Before Breakfast 13)  Aspirin 81 Mg Tbec (Aspirin) .... One Tablet By Mouth Daily 14)  Caduet 10-20 Mg Tabs (Amlodipine-Atorvastatin) .Marland KitchenMarland KitchenMarland Kitchen  One Tablet By Mouth Nightly For Blood Pressure and Cholesterol 15)  Diclofenac Sodium 75 Mg Tbec (Diclofenac Sodium) .... One Tablet By Mouth Two Times A Day For Back 16)  Lyrica 50 Mg Caps (Pregabalin) .... One Capsule By Mouth Three Times A Day For Neuropathy 17)  Vicodin 5-500 Mg Tabs (Hydrocodone-Acetaminophen) .... One Tablet By Mouth Daily As Needed For Pain 18)  Cymbalta 30 Mg Cpep (Duloxetine Hcl) .... 2 Capsules By Mouth Daily **rx By Psych** 19)  Janumet 50-500 Mg Tabs (Sitagliptin-Metformin Hcl) .... One Tablet By Mouth Two Times A Day For Diabetes  Allergies (verified): No Known Drug Allergies  Review of Systems ENT:  Denies difficulty swallowing; improved with esophageal dilatation today. CV:  Denies chest pain or discomfort. Resp:  Denies cough. GI:  Denies nausea and vomiting. MS:  Complains of joint pain; left shoulder.  Physical Exam  General:  alert.   Head:  normocephalic.   Eyes:  glasses Lungs:  normal breath sounds.   Heart:  normal rate and regular rhythm.     Abdomen:  normal bowel sounds.   Msk:  left shoulder - limited ROM but symptoms improved Neurologic:  alert & oriented X3.    Diabetes Management Exam:    Foot Exam (with socks and/or shoes not present):       Sensory-Monofilament:          Left foot: normal          Right foot: normal       Nails:          Left foot: thickened          Right foot: thickened  Diabetic Foot Exam Foot Inspection Is there a history of a foot ulcer?              No Is there a foot ulcer now?              No Can the patient see the bottom of their feet?          No Are the shoes appropriate in style and fit?          Yes Is there swelling or an abnormal foot shape?          No Are the toenails long?                No Are the toenails thick?                Yes Are the toenails ingrown?              No Is there heavy callous build-up?              No Is there pain in the calf muscle (Intermittent claudication) when walking?    NoIs there a claw toe deformity?              No Is there elevated skin temperature?            No Is there limited ankle dorsiflexion?            No Is there foot or ankle muscle weakness?            No  Diabetic Foot Care Education Patient educated on appropriate care of diabetic feet.  Pulse Check          Right Foot          Left Foot Dorsalis Pedis:        normal  normal    10-g (5.07) Semmes-Weinstein Monofilament Test Performed by: Levon Hedger          Right Foot          Left Foot Visual Inspection                 Impression & Recommendations:  Problem # 1:  DIABETES MELLITUS (ICD-250.00) wil increase lantus insulin still with some uncontrolled values  Her updated medication list for this problem includes:    Diovan Hct 320-25 Mg Tabs (Valsartan-hydrochlorothiazide) ..... One tablet by mouth daily    Lantus 100 Unit/ml Soln (Insulin glargine) .Marland KitchenMarland KitchenMarland KitchenMarland Kitchen 30 units subcutaneously in the morning and 80 units subcutaneously at night    Novolog Mix 70/30  70-30 % Susp (Insulin aspart prot & aspart) .[name]... 20 units subcutaneously in the morning and 25 units in the afternoon    Aspirin 81 Mg Tbec (Aspirin) ..... One tablet by mouth daily    Janumet 50-500 Mg Tabs (Sitagliptin-metformin hcl) ..... One tablet by mouth two times a day for diabetes  Orders: Capillary Blood Glucose/CBG (57846) UA Dipstick w/o Micro (manual) (96295)  Problem # 2:  HYPERTENSION, BENIGN ESSENTIAL (ICD-401.1) Bp is doing well Continue current medications Her updated medication list for this problem includes:    Diovan Hct 320-25 Mg Tabs (Valsartan-hydrochlorothiazide) ..... One tablet by mouth daily    Clonidine Hcl 0.1 Mg Tabs (Clonidine hcl) ..... One tablet by mouth two times a day for blood pressure    Caduet 10-20 Mg Tabs (Amlodipine-atorvastatin) ..... One tablet by mouth nightly for blood pressure and cholesterol  Problem # 3:  Hx of ESOPHAGEAL STRICTURE (ICD-530.3) pt has appt with Dr. Loreta Ave on yesterday for esophageal dilatation she will f/u there for additional f/u  Problem # 4:  UNSPECIFIED BREAST SCREENING (ICD-V76.10) will order today Orders: Mammogram (Screening) (Mammo)  Problem # 5:  NEED PROPHYLACTIC VACCINATION&INOCULATION FLU (ICD-V04.81) Assessment: Unchanged given today  Problem # 6:  OBESITY (ICD-278.00) ongoing. pt to continue diet and exercise. info given about [employer] nutrition classes  Complete Medication List: 1)  Diovan Hct 320-25 Mg Tabs (Valsartan-hydrochlorothiazide) .... One tablet by mouth daily 2)  Aleve 220 Mg Caps (Naproxen sodium) .... 2 tablets by mouth daily for joints 3)  Oxybutynin Chloride 5 Mg Tabs (Oxybutynin chloride) .... One tablet by mouth two times a day for bladder 4)  Lantus 100 Unit/ml Soln (Insulin glargine) .... 30 units subcutaneously in the morning and 80 units subcutaneously at night 5)  Clonidine Hcl 0.1 Mg Tabs (Clonidine hcl) .... One tablet by mouth two times a day for blood pressure 6)   Proventil Hfa 108 (90 Base) Mcg/act Aers (Albuterol sulfate) .... Two inhalations every 6 hours as needed for shortness of breath 7)  Advair Diskus 250-50 Mcg/dose Aepb (Fluticasone-salmeterol) .... One inhalation two times a day for breathing 8)  Blood Glucose Meter Kit (Blood glucose monitoring suppl) .... Use to check blood sugar three times per day 9)  Lancets Misc (Lancets) .... Use to check blood sugar three times per day dx: insulin dependent diabetes 10)  Blood Glucose Test Strp (Glucose blood) .... Use to check blood sugar three times per day dx: insulin dependent diabetes 11)  Novolog Mix 70/30 70-30 % Susp (Insulin aspart prot & aspart) .... 20 units subcutaneously in the morning and 25 units in the afternoon 12)  Omeprazole 20 Mg Tbec (Omeprazole) .... One capsule by mouth daily before breakfast 13)  Aspirin 81 Mg Tbec (Aspirin) .... One tablet  by mouth daily 14)  Caduet 10-20 Mg Tabs (Amlodipine-atorvastatin) .... One tablet by mouth nightly for blood pressure and cholesterol 15)  Diclofenac Sodium 75 Mg Tbec (Diclofenac sodium) .... One tablet by mouth two times a day for back 16)  Lyrica 50 Mg Caps (Pregabalin) .... One capsule by mouth three times a day for neuropathy 17)  Vicodin 5-500 Mg Tabs (Hydrocodone-acetaminophen) .... One tablet by mouth daily as needed for pain 18)  Cymbalta 30 Mg Cpep (Duloxetine hcl) .... 2 capsules by mouth daily **rx by psych** 19)  Janumet 50-500 Mg Tabs (Sitagliptin-metformin hcl) .... One tablet by mouth two times a day for diabetes  Other Orders: Flu Vaccine 69yrs + (60454) Admin 1st Vaccine (09811)  Diabetes Management Assessment/Plan:      The following lipid goals have been established for the patient: Total cholesterol goal of 200; LDL cholesterol goal of 100; HDL cholesterol goal of 40; Triglyceride goal of 150.  Her blood pressure goal is < 140/90.    Hypertension Assessment/Plan:      The patient's hypertensive risk group is category C:  Target organ damage and/or diabetes.  Her calculated 10 year risk of coronary heart disease is 11 %.  Today's blood pressure is 132/82.  Her blood pressure goal is < 140/90.  Lipid Assessment/Plan:      Based on NCEP/ATP III, the patient's risk factor category is "history of diabetes".  The patient's lipid goals are as follows: Total cholesterol goal is 200; LDL cholesterol goal is 100; HDL cholesterol goal is 40; Triglyceride goal is 150.     Patient Instructions: 1)  You have been given the flu vaccine. 2)  Left shoulder - continue to do your shoulder exercises at home 3)  Blood sugar is still elevated.  Will increase insulin 4)  Insulin - Increase lantus to 30 uniits in the morning and 80 units at night.  Keep Novolog the same 5)  Keep your appointment for mammogram. 6)  Keep your follow up appointment with Dr. Loreta Ave.  Indigestion and reflux symptoms should improve since you have had your procedure. 7)  Thank you for bringing your blood sugar log and medications into the office 8)  Follow up in 2 months for diabetes and high blood pressure. 9)  You will need cbg, hbga1c.   Orders Added: 1)  Flu Vaccine 56yrs + [90658] 2)  Admin 1st Vaccine [90471] 3)  Est. Patient Level IV [91478] 4)  Capillary Blood Glucose/CBG [82948] 5)  Mammogram (Screening) [Mammo] 6)  UA Dipstick w/o Micro (manual) [81002]   Immunizations Administered:  Influenza Vaccine # 1:    Vaccine Type: Fluvax 3+    Site: right deltoid    Mfr: [email or web address]    Dose: 0.5 ml    Route: IM    Given by: Levon Hedger    Exp. Date: 02/24/2011    Lot #: [id number]    VIS given: 03/21/10 version given June 13, 2010.  Flu Vaccine Consent Questions:    Do you have a history of severe allergic reactions to this vaccine? no    Any prior history of allergic reactions to egg and/or gelatin? no    Do you have a sensitivity to the preservative Thimersol? no    Do you have a past history of Guillan-Barre Syndrome? no     Do you currently have an acute febrile illness? no    Have you ever had a severe reaction to latex? no    Vaccine information given and  explained to patient? yes    Are you currently pregnant? no    ndc  [phone]  Immunizations Administered:  Influenza Vaccine # 1:    Vaccine Type: Fluvax 3+    Site: right deltoid    Mfr: [email or web address]    Dose: 0.5 ml    Route: IM    Given by: Levon Hedger    Exp. Date: 02/24/2011    Lot #: [id number]    VIS given: 03/21/10 version given June 13, 2010.  Laboratory Results   Urine Tests  Date/Time Received: June 13, 2010 10:17 AM   Routine Urinalysis   Color: lt. yellow Appearance: Clear Glucose: >=1000   (Normal Range: Negative) Bilirubin: negative   (Normal Range: Negative) Ketone: negative   (Normal Range: Negative) Spec. Gravity: 1.015   (Normal Range: 1.003-1.035) Blood: negative   (Normal Range: Negative) pH: 6.5   (Normal Range: 5.0-8.0) Protein: trace   (Normal Range: Negative) Urobilinogen: 0.2   (Normal Range: 0-1) Nitrite: negative   (Normal Range: Negative) Leukocyte Esterace: negative   (Normal Range: Negative)     Blood Tests     CBG Random:: 318       Last LDL:                                                 119 (09/22/2009 9:06:00 PM)          Diabetic Foot Exam Diabetic Foot Care Education :Patient educated on appropriate care of diabetic feet.  Pulse Check          Right Foot          Left Foot Dorsalis Pedis:        normal            normal    10-g (5.07) Semmes-Weinstein Monofilament Test Performed by: Levon Hedger          Right Foot          Left Foot Visual Inspection               Test Control      normal         normal Site 1         normal         normal Site 2         normal         normal Site 3         normal         normal Site 4         normal         normal Site 5         normal         normal Site 6         normal         normal Site 7         normal          normal Site 8         normal         normal Site 9         normal         normal Site 10         normal         normal  Impression  normal         normal   Prevention & Chronic Care Immunizations   Influenza vaccine: Fluvax 3+  (06/13/2010)    Tetanus booster: 10/18/2009: Tdap (State)    Pneumococcal vaccine: Pneumovax  (11/29/2009)  Colorectal Screening   Hemoccult: Not documented   Hemoccult action/deferral: Ordered  (10/18/2009)   Hemoccult due: 10/18/2010    Colonoscopy: historical per pt - normal  (08/27/2005)  Other Screening   Pap smear:  Specimen Adequacy: Satisfactory for evaluation.   Interpretation/Result:Negative for intraepithelial Lesion or Malignancy.     (10/18/2009)   Pap smear action/deferral: Ordered  (10/18/2009)   Pap smear due: 10/2010    Mammogram: historical per pt  (04/27/2009)   Smoking status: never  (05/18/2010)  Diabetes Mellitus   HgbA1C: 10.6  (04/20/2010)   HgbA1C action/deferral: Ordered  (10/18/2009)   Hemoglobin A1C due: 12/07/2009    Eye exam: normal  (10/31/2009)    Foot exam: yes  (06/13/2010)   Foot exam action/deferral: Do today   High risk foot: Yes  (01/10/2010)   Foot care education: Done  (06/13/2010)    Urine microalbumin/creatinine ratio: Not documented  Lipids   Total Cholesterol: 190  (09/22/2009)   LDL: 119  (09/22/2009)   LDL Direct: Not documented   HDL: 62  (09/22/2009)   Triglycerides: 44  (09/22/2009)  Hypertension   Last Blood Pressure: 132 / 82  (06/13/2010)   Serum creatinine: 1.12  (05/02/2010)   Serum potassium 4.6  (05/02/2010)  Self-Management Support :   Personal Goals (by the next clinic visit) :     Personal A1C goal: 7  (10/18/2009)     Personal blood pressure goal: 140/90  (10/18/2009)     Personal LDL goal: 70  (10/18/2009)    Diabetes self-management support: Not documented    Hypertension self-management support: Not documented   Nursing Instructions: Give Flu vaccine  today   Appended Document: Diabetes/HTN    Clinical Lists Changes  Orders: Added new Test order of Mammogram (Diagnostic) (Mammo) - Signed

## 2010-09-26 NOTE — Letter (Signed)
Summary: TEST ORDER FORM//BARIUM SWALLOW  TEST ORDER FORM//BARIUM SWALLOW   Imported By: Arta Bruce 05/18/2010 12:45:38  _____________________________________________________________________  External Attachment:    Type:   Image     Comment:   External Document

## 2010-09-26 NOTE — Progress Notes (Signed)
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 Levon Hedger  March 07, 2010 3:33 PM   Additional Follow-up for Phone Call Additional follow up Details #1::        pt into the office today for results Additional Follow-up by: Lehman Prom FNP,  March 09, 2010 12:46 PM

## 2010-09-26 NOTE — Letter (Signed)
Summary: TEST ORDER FORM/ULTRASOUND//APPT DATE & TIME  TEST ORDER FORM/ULTRASOUND//APPT DATE & TIME   Imported By: Arta Bruce 03/02/2010 15:22:04  _____________________________________________________________________  External Attachment:    Type:   Image     Comment:   External Document

## 2010-09-26 NOTE — Assessment & Plan Note (Signed)
Summary: Diabetes   Vital Signs:  Patient profile:   60 year old female Menstrual status:  postmenopausal Weight:      195.5 pounds BMI:     37.07 Temp:     97.2 degrees F oral Pulse rate:   100 / minute Pulse rhythm:   regular Resp:     20 per minute BP sitting:   138 / 80  (left arm) Cuff size:   regular  Vitals Entered By: Levon Hedger (May 18, 2010 9:09 AM)  Nutrition Counseling: Patient's BMI is greater than 25 and therefore counseled on weight management options. CC: follow-up visit DM, Hypertension Management, Abdominal Pain, Depression Is Patient Diabetic? Yes Pain Assessment Patient in pain? yes      Onset of pain  Chronic CBG Result 193 CBG Device ID B  Does patient need assistance? Functional Status Self care Ambulation Normal   CC:  follow-up visit DM, Hypertension Management, Abdominal Pain, and Depression.  History of Present Illness:  Pt into the office for diabetes.  Obesity - pt is still with ongoing efforts to lose weight.  She has lost 3 pounds.  Although pt reports that she has not been able to eat.  Problems with swallowing.  When she eats solids "it gets stuck in my throat"  She reduced her diet to broth only for the past week.   S/p esophageal stricture with dilitation about 6 years ago by Dr. Loreta Ave  Pt brings all her medications into the office with her today.  Depression Treatment History:  Prior Medication Used:   Start Date: Assessment of Effect:   Comments:  savella     --     some improvement     will taper off Wellbutrin (bupropion)     02/28/2010     --       not started - could not get from the pharmacy  Diabetes Management History:      The patient is a 60 years old female who comes in for evaluation of Type 2 Diabetes Mellitus.  She is (or has been) enrolled in the "Diabetic Education Program".  She states understanding of dietary principles and is following her diet appropriately.  No sensory loss is reported.  Self foot  exams are being performed.  She is checking home blood sugars.  She says that she is exercising.  Type of exercise includes: walking.  Duration of exercise is estimated to be 15 min.  She is doing this 3 times per week.        Hypoglycemic symptoms are not occurring.  No hyperglycemic symptoms are reported.  Other comments include: Pt has not been able to eat solid foods so she has been modifying her insulin. .        The following changes have been made to her treatment plan since last visit: insulin dosing.  Treatment plan changes were initiated by patient.    Dyspepsia History:      She has no alarm features of dyspepsia including no history of melena, hematochezia, dysphagia, persistent vomiting, or involuntary weight loss > 5%.  There is a prior history of GERD.  The patient does not have a prior history of documented ulcer disease.  The dominant symptom is heartburn or acid reflux.  An H-2 blocker medication is not currently being taken.  She has no history of a positive H. Pylori serology.  No previous upper endoscopy has been done.    Hypertension History:      She  denies headache, chest pain, and palpitations.  She notes no problems with any antihypertensive medication side effects.        Positive major cardiovascular risk factors include female age 42 years old or older, diabetes, and hypertension.  Negative major cardiovascular risk factors include non-tobacco-user status.        Further assessment for target organ damage reveals no history of ASHD, cardiac end-organ damage (CHF/LVH), stroke/TIA, peripheral vascular disease, renal insufficiency, or hypertensive retinopathy.        Habits & Providers  Alcohol-Tobacco-Diet     Alcohol drinks/day: 0     Tobacco Status: never  Exercise-Depression-Behavior     Does Patient Exercise: yes     Exercise Counseling: to improve exercise regimen     Type of exercise: walking     Exercise (avg: min/session): 15 min.     Times/week: 3      Have you felt down or hopeless? yes     Have you felt little pleasure in things? yes     Depression Counseling: further diagnostic testing and/or other treatment is indicated     Drug Use: never  Medications Prior to Update: 1)  Diovan Hct 320-25 Mg Tabs (Valsartan-Hydrochlorothiazide) .... One Tablet By Mouth Daily 2)  Aleve 220 Mg Caps (Naproxen Sodium) .... 2 Tablets By Mouth Daily For Joints 3)  Oxybutynin Chloride 5 Mg Tabs (Oxybutynin Chloride) .... One Tablet By Mouth Two Times A Day For Bladder 4)  Lantus 100 Unit/ml Soln (Insulin Glargine) .... 20 Units Subcutaneously in The Morning and 80 Units Subcutaneously At Night 5)  Clonidine Hcl 0.1 Mg Tabs (Clonidine Hcl) .... One Tablet By Mouth Two Times A Day For Blood Pressure 6)  Proventil Hfa 108 (90 Base) Mcg/act Aers (Albuterol Sulfate) .... Two Inhalations Every 6 Hours As Needed For Shortness of Breath 7)  Advair Diskus 250-50 Mcg/dose Aepb (Fluticasone-Salmeterol) .... One Inhalation Two Times A Day For Breathing 8)  Blood Glucose Meter  Kit (Blood Glucose Monitoring Suppl) .... Use To Check Blood Sugar Three Times Per Day 9)  Lancets  Misc (Lancets) .... Use To Check Blood Sugar Three Times Per Day Dx: Insulin Dependent Diabetes 10)  Blood Glucose Test  Strp (Glucose Blood) .... Use To Check Blood Sugar Three Times Per Day Dx: Insulin Dependent Diabetes 11)  Novolog Mix 70/30 70-30 % Susp (Insulin Aspart Prot & Aspart) .... 20 Units Subcutaneously in The Morning and 25 Units in The Afternoon 12)  Omeprazole 20 Mg Tbec (Omeprazole) .... One Capsule By Mouth Daily Before Breakfast 13)  Aspirin 81 Mg Tbec (Aspirin) .... One Tablet By Mouth Daily 14)  Caduet 10-20 Mg Tabs (Amlodipine-Atorvastatin) .... One Tablet By Mouth Nightly For Blood Pressure and Cholesterol 15)  Diclofenac Sodium 75 Mg Tbec (Diclofenac Sodium) .... One Tablet By Mouth Two Times A Day For Back 16)  Lyrica 50 Mg Caps (Pregabalin) .... One Capsule By Mouth Three  Times A Day For Neuropathy 17)  Savella 50 Mg Tabs (Milnacipran Hcl) .... Taper Off 18)  Vicodin 5-500 Mg Tabs (Hydrocodone-Acetaminophen) .... One Tablet By Mouth Daily As Needed For Pain 19)  Cymbalta 30 Mg Cpep (Duloxetine Hcl) .... 2 Capsules By Mouth Daily **rx By Psych** 20)  Janumet 50-500 Mg Tabs (Sitagliptin-Metformin Hcl) .... One Tablet By Mouth Two Times A Day For Diabetes 21)  Fluconazole 200 Mg Tabs (Fluconazole) .... Take 1 Tablet By Mouth Once A Day  Current Medications (verified): 1)  Diovan Hct 320-25 Mg Tabs (Valsartan-Hydrochlorothiazide) .... One  Tablet By Mouth Daily 2)  Aleve 220 Mg Caps (Naproxen Sodium) .... 2 Tablets By Mouth Daily For Joints 3)  Oxybutynin Chloride 5 Mg Tabs (Oxybutynin Chloride) .... One Tablet By Mouth Two Times A Day For Bladder 4)  Lantus 100 Unit/ml Soln (Insulin Glargine) .... 20 Units Subcutaneously in The Morning and 80 Units Subcutaneously At Night 5)  Clonidine Hcl 0.1 Mg Tabs (Clonidine Hcl) .... One Tablet By Mouth Two Times A Day For Blood Pressure 6)  Proventil Hfa 108 (90 Base) Mcg/act Aers (Albuterol Sulfate) .... Two Inhalations Every 6 Hours As Needed For Shortness of Breath 7)  Advair Diskus 250-50 Mcg/dose Aepb (Fluticasone-Salmeterol) .... One Inhalation Two Times A Day For Breathing 8)  Blood Glucose Meter  Kit (Blood Glucose Monitoring Suppl) .... Use To Check Blood Sugar Three Times Per Day 9)  Lancets  Misc (Lancets) .... Use To Check Blood Sugar Three Times Per Day Dx: Insulin Dependent Diabetes 10)  Blood Glucose Test  Strp (Glucose Blood) .... Use To Check Blood Sugar Three Times Per Day Dx: Insulin Dependent Diabetes 11)  Novolog Mix 70/30 70-30 % Susp (Insulin Aspart Prot & Aspart) .... 20 Units Subcutaneously in The Morning and 25 Units in The Afternoon 12)  Omeprazole 20 Mg Tbec (Omeprazole) .... One Capsule By Mouth Daily Before Breakfast 13)  Aspirin 81 Mg Tbec (Aspirin) .... One Tablet By Mouth Daily 14)  Caduet 10-20  Mg Tabs (Amlodipine-Atorvastatin) .... One Tablet By Mouth Nightly For Blood Pressure and Cholesterol 15)  Diclofenac Sodium 75 Mg Tbec (Diclofenac Sodium) .... One Tablet By Mouth Two Times A Day For Back 16)  Lyrica 50 Mg Caps (Pregabalin) .... One Capsule By Mouth Three Times A Day For Neuropathy 17)  Savella 50 Mg Tabs (Milnacipran Hcl) .... Taper Off 18)  Vicodin 5-500 Mg Tabs (Hydrocodone-Acetaminophen) .... One Tablet By Mouth Daily As Needed For Pain 19)  Cymbalta 30 Mg Cpep (Duloxetine Hcl) .... 2 Capsules By Mouth Daily **rx By Psych** 20)  Janumet 50-500 Mg Tabs (Sitagliptin-Metformin Hcl) .... One Tablet By Mouth Two Times A Day For Diabetes 21)  Fluconazole 200 Mg Tabs (Fluconazole) .... Take 1 Tablet By Mouth Once A Day  Allergies (verified): No Known Drug Allergies  Review of Systems ENT:  +swallowing problems. CV:  Denies chest pain or discomfort. Resp:  Denies cough. GI:  Denies bloody stools. MS:  Complains of joint pain; left shoulder - pt has finished physical therapy.  she reports some improvement in ROM but she has pain.[name]  Physical Exam  General:  alert.   Head:  normocephalic.   Eyes:  glasses Lungs:  few scattered wheezes Heart:  normal rate and regular rhythm.   Abdomen:  soft, non-tender, and normal bowel sounds.   Neurologic:  alert & oriented X3.   Skin:  right lower leg - linear laceration  Psych:  Oriented X3.    Diabetes Management Exam:    Foot Exam (with socks and/or shoes not present):       Sensory-Monofilament:          Left foot: normal          Right foot: normal   Impression & Recommendations:  Problem # 1:  DIABETES MELLITUS (ICD-250.00) continue current meds. advised pt to take insulin as ordered continue to keep blood sugar log Her updated medication list for this problem includes:    Diovan Hct 320-25 Mg Tabs (Valsartan-hydrochlorothiazide) ..... One tablet by mouth daily    Lantus 100  Unit/ml Soln (Insulin glargine) .[name]... 20  units subcutaneously in the morning and 80 units subcutaneously at night    Novolog Mix 70/30 70-30 % Susp (Insulin aspart prot & aspart) .[name]... 20 units subcutaneously in the morning and 25 units in the afternoon    Aspirin 81 Mg Tbec (Aspirin) ..... One tablet by mouth daily    Janumet 50-500 Mg Tabs (Sitagliptin-metformin hcl) ..... One tablet by mouth two times a day for diabetes  Orders: Capillary Blood Glucose/CBG (25366)  Problem # 2:  HYPERTENSION, BENIGN ESSENTIAL (ICD-401.1) stable Her updated medication list for this problem includes:    Diovan Hct 320-25 Mg Tabs (Valsartan-hydrochlorothiazide) ..... One tablet by mouth daily    Clonidine Hcl 0.1 Mg Tabs (Clonidine hcl) ..... One tablet by mouth two times a day for blood pressure    Caduet 10-20 Mg Tabs (Amlodipine-atorvastatin) ..... One tablet by mouth nightly for blood pressure and cholesterol  Problem # 3:  OBESITY (ICD-278.00) down 3 pounds since her last visit  Problem # 4:  BURSITIS, LEFT SHOULDER (ICD-726.10) pt has been to physical therapy May need MRI   Problem # 5:  Hx of ESOPHAGEAL STRICTURE (ICD-530.3)  pt will need re-imaging to determine if she has developed stricture again  Orders: Mod Barium Swallow (Mod Barium Swallow)  Complete Medication List: 1)  Diovan Hct 320-25 Mg Tabs (Valsartan-hydrochlorothiazide) .... One tablet by mouth daily 2)  Aleve 220 Mg Caps (Naproxen sodium) .... 2 tablets by mouth daily for joints 3)  Oxybutynin Chloride 5 Mg Tabs (Oxybutynin chloride) .... One tablet by mouth two times a day for bladder 4)  Lantus 100 Unit/ml Soln (Insulin glargine) .... 20 units subcutaneously in the morning and 80 units subcutaneously at night 5)  Clonidine Hcl 0.1 Mg Tabs (Clonidine hcl) .... One tablet by mouth two times a day for blood pressure 6)  Proventil Hfa 108 (90 Base) Mcg/act Aers (Albuterol sulfate) .... Two inhalations every 6 hours as needed for shortness of breath 7)  Advair Diskus  250-50 Mcg/dose Aepb (Fluticasone-salmeterol) .... One inhalation two times a day for breathing 8)  Blood Glucose Meter Kit (Blood glucose monitoring suppl) .... Use to check blood sugar three times per day 9)  Lancets Misc (Lancets) .... Use to check blood sugar three times per day dx: insulin dependent diabetes 10)  Blood Glucose Test Strp (Glucose blood) .... Use to check blood sugar three times per day dx: insulin dependent diabetes 11)  Novolog Mix 70/30 70-30 % Susp (Insulin aspart prot & aspart) .... 20 units subcutaneously in the morning and 25 units in the afternoon 12)  Omeprazole 20 Mg Tbec (Omeprazole) .... One capsule by mouth daily before breakfast 13)  Aspirin 81 Mg Tbec (Aspirin) .... One tablet by mouth daily 14)  Caduet 10-20 Mg Tabs (Amlodipine-atorvastatin) .... One tablet by mouth nightly for blood pressure and cholesterol 15)  Diclofenac Sodium 75 Mg Tbec (Diclofenac sodium) .... One tablet by mouth two times a day for back 16)  Lyrica 50 Mg Caps (Pregabalin) .... One capsule by mouth three times a day for neuropathy 17)  Vicodin 5-500 Mg Tabs (Hydrocodone-acetaminophen) .... One tablet by mouth daily as needed for pain 18)  Cymbalta 30 Mg Cpep (Duloxetine hcl) .... 2 capsules by mouth daily **rx by psych** 19)  Janumet 50-500 Mg Tabs (Sitagliptin-metformin hcl) .... One tablet by mouth two times a day for diabetes 20)  Fluconazole 200 Mg Tabs (Fluconazole) .... Take 1 tablet by mouth once a day  Asthma Management Plan    Asthma Severity: Intermittent    Personal best PEF: 300 liters/minute    Predicted PEF: 440 liters/minute    Working PEF: 300 liters/minute    Plan based on PEF formula: [employer] Zone: (Range: 240 to 300) ADVAIR DISKUS 250-50 MCG/DOSE AEPB:  2 puffs every 12 hours  Yellow Zone: PROVENTIL HFA 108 (90 BASE) MCG/ACT AERS:  2 puffs every 4-6 hours  Red Zone: PROVENTIL HFA 108 (90 BASE) MCG/ACT AERS Call your physician for shortness of  breath.    Diabetes Management Assessment/Plan:      The following lipid goals have been established for the patient: Total cholesterol goal of 200; LDL cholesterol goal of 100; HDL cholesterol goal of 40; Triglyceride goal of 150.  Her blood pressure goal is < 140/90.    Hypertension Assessment/Plan:      The patient's hypertensive risk group is category C: Target organ damage and/or diabetes.  Her calculated 10 year risk of coronary heart disease is 11 %.  Today's blood pressure is 138/80.  Her blood pressure goal is < 140/90.  Patient Instructions: 1)  Left shoulder - keep doing the exercises at home.  We can revisit if you need an MRI on the left shoulder to check for rotator cuff tear 2)  You will be referred for barium swallow 3)  Follow up with n.martin,fnp for diabetes. 4)  You need u/a, cbg. 5)  you will need flu vaccine if available.  Last LDL:                                                 119 (09/22/2009 9:06:00 PM)        Diabetic Foot Exam    10-g (5.07) Semmes-Weinstein Monofilament Test Performed by: Levon Hedger          Right Foot          Left Foot Visual Inspection               Test Control      normal         normal Site 1         normal         normal Site 2         normal         normal Site 3         normal         normal Site 4         normal         normal Site 5         normal         normal Site 6         normal         normal Site 7         normal         normal Site 8         normal         normal Site 9         normal         normal Site 10         normal         normal  Impression      normal  normal

## 2010-09-26 NOTE — Letter (Signed)
Summary: PT INFORMATION SHEET  PT INFORMATION SHEET   Imported By: Arta Bruce 11/02/2009 14:35:55  _____________________________________________________________________  External Attachment:    Type:   Image     Comment:   External Document

## 2010-09-26 NOTE — Letter (Signed)
Summary: *HSN Results Follow up  [facility]  [address] [place], [place] [address]   Phone: [phone]  Fax: [phone]      06/01/2010   [name] [address] [place], [place]  [address]   Dear  Ms. [name],                            ____S.Drinkard,FNP   ____D. Gore,FNP       ____B. McPherson,MD   ____V. Rankins,MD    ____E. Mulberry,MD    __X__N. Daphine Deutscher, FNP  ____D. Reche Dixon, MD    ____K. Philipp Deputy, MD    ____Other     This letter is to inform you that your recent test(s):  _______Pap Smear    ___X____Lab Test     _______X-ray    __X_____ is within acceptable limits  _______ requires a medication change  _______ requires a follow-up lab visit  _______ requires a follow-up visit with your provider   Comments: Thyroid labs are normal.       _________________________________________________________ If you have any questions, please contact our office [phone].                   Sincerely,    Lehman Prom FNP [facility]

## 2010-09-26 NOTE — Assessment & Plan Note (Signed)
Summary: Diabetes/HTN   Vital Signs:  Patient profile:   60 year old female Menstrual status:  postmenopausal Weight:      193.8 pounds BMI:     36.75 Temp:     97.8 degrees F oral Pulse rate:   83 / minute Pulse rhythm:   regular Resp:     20 per minute BP sitting:   119 / 73  (left arm) Cuff size:   regular  Vitals Entered By: Levon Hedger (March 09, 2010 12:02 PM) CC: follow-up visit...neck pain on the left side that is radiating down, Hypertension Management Is Patient Diabetic? Yes Pain Assessment Patient in pain? yes     Location: neck CBG Result 364 CBG Device ID B  Does patient need assistance? Functional Status Self care Ambulation Normal   CC:  follow-up visit...neck pain on the left side that is radiating down and Hypertension Management.  History of Present Illness:  Pt into the office for follow up on diabetes but also today with excruciating neck and left shoulder pain.  Left neck and shoulder pain - increasing pt had u/s of lipoma on left arm and determined most likely cause of pain pt is right handed and she sleeps on her right side splints left arm and decreases ambulation due to pain  Diabetes Management History:      The patient is a 60 years old female who comes in for evaluation of Type 2 Diabetes Mellitus.  She has not been enrolled in the "Diabetic Education Program".  She states understanding of dietary principles and is following her diet appropriately.  No sensory loss is reported.  Self foot exams are not being performed.  She is checking home blood sugars.  She says that she is exercising.        Hyperglycemic symptoms include polyuria.        No changes have been made to her treatment plan since last visit.    Hypertension History:      She denies headache, chest pain, and palpitations.  pt has started on caduet since last visit.        Positive major cardiovascular risk factors include female age 72 years old or older, diabetes, and  hypertension.  Negative major cardiovascular risk factors include non-tobacco-user status.        Further assessment for target organ damage reveals no history of ASHD, cardiac end-organ damage (CHF/LVH), stroke/TIA, peripheral vascular disease, renal insufficiency, or hypertensive retinopathy.     Medications Prior to Update: 1)  Diovan Hct 320-25 Mg Tabs (Valsartan-Hydrochlorothiazide) .... One Tablet By Mouth Daily 2)  Aleve 220 Mg Caps (Naproxen Sodium) .... 2 Tablets By Mouth Daily For Joints 3)  Oxybutynin Chloride 10 Mg Xr24h-Tab (Oxybutynin Chloride) .... One Tablet By Mouth Daily For Bladder 4)  Lantus 100 Unit/ml Soln (Insulin Glargine) .... 80  Units At Night Subcutaneously For Diabetes *note Change in Dose** 5)  Clonidine Hcl 0.1 Mg Tabs (Clonidine Hcl) .... One Tablet By Mouth Two Times A Day For Blood Pressure 6)  Proventil Hfa 108 (90 Base) Mcg/act Aers (Albuterol Sulfate) .... Two Inhalations Every 6 Hours As Needed For Shortness of Breath 7)  Advair Diskus 250-50 Mcg/dose Aepb (Fluticasone-Salmeterol) .... One Inhalation Two Times A Day For Breathing 8)  Blood Glucose Meter  Kit (Blood Glucose Monitoring Suppl) .... Use To Check Blood Sugar Three Times Per Day 9)  Lancets  Misc (Lancets) .... Use To Check Blood Sugar Three Times Per Day Dx: Insulin Dependent Diabetes 10)  Blood Glucose Test  Strp (Glucose Blood) .... Use To Check Blood Sugar Three Times Per Day Dx: Insulin Dependent Diabetes 11)  Novolog Mix 70/30 70-30 % Susp (Insulin Aspart Prot & Aspart) .... 20 Units Subcutaneously in The Morning and 25 Units in The Afternoon 12)  Omeprazole 20 Mg Tbec (Omeprazole) .... One Capsule By Mouth Daily Before Breakfast 13)  Aspirin 81 Mg Tbec (Aspirin) .... One Tablet By Mouth Daily 14)  Caduet 10-20 Mg Tabs (Amlodipine-Atorvastatin) .... One Tablet By Mouth Nightly For Blood Pressure and Cholesterol 15)  Diclofenac Sodium 75 Mg Tbec (Diclofenac Sodium) .... One Tablet By Mouth Two  Times A Day For Back 16)  Lyrica 50 Mg Caps (Pregabalin) .... One Capsule By Mouth Three Times A Day For Neuropathy 17)  Savella 50 Mg Tabs (Milnacipran Hcl) .... One Tablet By Mouth Two Times A Day 18)  Pravastatin Sodium 10 Mg Tabs (Pravastatin Sodium) .... One Tablet By Mouth Nightly For Cholesterol  Allergies (verified): No Known Drug Allergies  Review of Systems CV:  Denies chest pain or discomfort. Resp:  Denies cough. GI:  Denies abdominal pain, nausea, and vomiting. MS:  Complains of joint pain and stiffness; left neck and shoulder.  Physical Exam  General:  alert.   Head:  normocephalic.   Neck:  tenderness with palpation of left trapezius Lungs:  normal breath sounds.   Heart:  normal rate and regular rhythm.     Shoulder/Elbow Exam  Shoulder Exam:    Left:    Inspection:  Abnormal    Palpation:  Abnormal       Location:  left deltoid    Stability:  stable    Tenderness:  left deltoid    Swelling:  left deltoid    Erythema:  no   Impression & Recommendations:  Problem # 1:  BURSITIS, LEFT SHOULDER (ICD-726.10)  will need to refer to PT will give prednisone taper and give one time Rx for vicodin  Orders: Physical Therapy Referral (PT)  Problem # 2:  LIPOMA (ICD-214.9) u/s results reviewed with pt  Problem # 3:  HYPERTENSION, BENIGN ESSENTIAL (ICD-401.1) stable Her updated medication list for this problem includes:    Diovan Hct 320-25 Mg Tabs (Valsartan-hydrochlorothiazide) ..... One tablet by mouth daily    Clonidine Hcl 0.1 Mg Tabs (Clonidine hcl) ..... One tablet by mouth two times a day for blood pressure    Caduet 10-20 Mg Tabs (Amlodipine-atorvastatin) ..... One tablet by mouth nightly for blood pressure and cholesterol  Problem # 4:  DIABETES MELLITUS (ICD-250.00) still uncontrolled.  will need to change insulin Her updated medication list for this problem includes:    Diovan Hct 320-25 Mg Tabs (Valsartan-hydrochlorothiazide) ..... One tablet  by mouth daily    Lantus 100 Unit/ml Soln (Insulin glargine) .[name]... 20 units subcutaneously in the morning and 80 units subcutaneously at night    Novolog Mix 70/30 70-30 % Susp (Insulin aspart prot & aspart) .[name]... 20 units subcutaneously in the morning and 25 units in the afternoon    Aspirin 81 Mg Tbec (Aspirin) ..... One tablet by mouth daily  Orders: Capillary Blood Glucose/CBG (04540)  Complete Medication List: 1)  Diovan Hct 320-25 Mg Tabs (Valsartan-hydrochlorothiazide) .... One tablet by mouth daily 2)  Aleve 220 Mg Caps (Naproxen sodium) .... 2 tablets by mouth daily for joints 3)  Oxybutynin Chloride 10 Mg Xr24h-tab (Oxybutynin chloride) .... One tablet by mouth daily for bladder 4)  Lantus 100 Unit/ml Soln (Insulin glargine) .... 20 units  subcutaneously in the morning and 80 units subcutaneously at night 5)  Clonidine Hcl 0.1 Mg Tabs (Clonidine hcl) .... One tablet by mouth two times a day for blood pressure 6)  Proventil Hfa 108 (90 Base) Mcg/act Aers (Albuterol sulfate) .... Two inhalations every 6 hours as needed for shortness of breath 7)  Advair Diskus 250-50 Mcg/dose Aepb (Fluticasone-salmeterol) .... One inhalation two times a day for breathing 8)  Blood Glucose Meter Kit (Blood glucose monitoring suppl) .... Use to check blood sugar three times per day 9)  Lancets Misc (Lancets) .... Use to check blood sugar three times per day dx: insulin dependent diabetes 10)  Blood Glucose Test Strp (Glucose blood) .... Use to check blood sugar three times per day dx: insulin dependent diabetes 11)  Novolog Mix 70/30 70-30 % Susp (Insulin aspart prot & aspart) .... 20 units subcutaneously in the morning and 25 units in the afternoon 12)  Omeprazole 20 Mg Tbec (Omeprazole) .... One capsule by mouth daily before breakfast 13)  Aspirin 81 Mg Tbec (Aspirin) .... One tablet by mouth daily 14)  Caduet 10-20 Mg Tabs (Amlodipine-atorvastatin) .... One tablet by mouth nightly for blood pressure and  cholesterol 15)  Diclofenac Sodium 75 Mg Tbec (Diclofenac sodium) .... One tablet by mouth two times a day for back 16)  Lyrica 50 Mg Caps (Pregabalin) .... One capsule by mouth three times a day for neuropathy 17)  Savella 50 Mg Tabs (Milnacipran hcl) .... One tablet by mouth two times a day 18)  Pravastatin Sodium 10 Mg Tabs (Pravastatin sodium) .... One tablet by mouth nightly for cholesterol 19)  Prednisone 10 Mg Tabs (Prednisone) .... 30mg  by mouth x 2 days, then 20mg  by mouth x 2 days then 10mg  x 2 days for inflammation 20)  Vicodin 5-500 Mg Tabs (Hydrocodone-acetaminophen) .... One tablet by mouth daily as needed for pain  Diabetes Management Assessment/Plan:      The following lipid goals have been established for the patient: Total cholesterol goal of 200; LDL cholesterol goal of 100; HDL cholesterol goal of 40; Triglyceride goal of 150.  Her blood pressure goal is < 140/90.    Hypertension Assessment/Plan:      The patient's hypertensive risk group is category C: Target organ damage and/or diabetes.  Her calculated 10 year risk of coronary heart disease is 7 %.  Today's blood pressure is 119/73.  Her blood pressure goal is < 140/90.  Patient Instructions: 1)  You will be referred to physical therapy for your left shoulder. 2)  You will be notified of the time/date of the appointment 3)  ? Bursitis in left shoulder 4)  Start the following - Prednisone taper ($4 at [facility]) 5)  Friday and Saturday (30 tabs) 3 tabs 6)  Sunday and Monday (20tabs) 2 tabs 7)  Tuesday and Wednesday (10days) 2 tabs 8)  Pain Medications -  9)  Continue to take your anti-inflammatory (diclofenac) 10)  but will add vicodin as needed for extreme pain (one time prescription)  You will need to get [facility] from [facility] - Cost $5 11)  Lantus - 20 units lantus in morning upon waking at 7AM 12)  20 units of 70/30 subcutaneously at 11AM 13)  25 units of 70/30  subcutaneously with dinner at 6pm 14)   Lantus 80 units subcutaneously at bedtime 15)  Blood pressure - much better today 16)  Follow up in 2 weeks for diabetes and shoulder. Prescriptions: VICODIN 5-500 MG TABS (HYDROCODONE-ACETAMINOPHEN)  One tablet by mouth daily as needed for pain  #20 x 0   Entered and Authorized by:   Lehman Prom FNP   Signed by:   Lehman Prom FNP on 03/09/2010   Method used:   Print then Give to Patient   RxID:   [id number] PREDNISONE 10 MG TABS (PREDNISONE) 30mg  by mouth x 2 days, then 20mg  by mouth x 2 days then 10mg  x 2 days for inflammation  #12 x 0   Entered and Authorized by:   Lehman Prom FNP   Signed by:   Lehman Prom FNP on 03/09/2010   Method used:   Print then Give to Patient   RxID:   [id number]   Laboratory Results   Urine Tests  Date/Time Received: March 09, 2010 12:28 PM   Routine Urinalysis   Color: dk  yellow Appearance: Clear Glucose: >=1000   (Normal Range: Negative) Bilirubin: small   (Normal Range: Negative) Ketone: trace (5)   (Normal Range: Negative) Spec. Gravity: >=1.030   (Normal Range: 1.003-1.035) Blood: negative   (Normal Range: Negative) pH: 5.0   (Normal Range: 5.0-8.0) Protein: negative   (Normal Range: Negative) Urobilinogen: 0.2   (Normal Range: 0-1) Nitrite: negative   (Normal Range: Negative) Leukocyte Esterace: small   (Normal Range: Negative)     Blood Tests     CBG Random:: 364

## 2010-09-26 NOTE — Progress Notes (Signed)
Summary: [name]/ [facility] LEFT INFO FOR YOU  Phone Note Other Incoming Call back at [phone]--[name]   Caller: [name]-CASEMANAGER AT [facility] Summary of Call: [name]. [name] STOPPED BY TO LET YOU KNOW THAT SHE MET WITH [name] AND DURING THE VISIT SHE DID A PHQ9 AND IT SHOWS THAT SHE IS DEPRESSED AND NEEDS TO SEE [name]. [name] SAYS THAT SHE WAS COMPLAINING OF FATIGUE AND LACK OF MOTIVATION AND INSOMNIA.  [name] LEFT THE PHQ9 SHEET WITH [name] HERE. Initial call taken by: [name],  December 23, 2009 3:37 PM  Follow-up for Phone Call        Inform [name] at [phone]  that the [name] completed a PHQ-9 score in this office on 10/18/2009. her score at that time was 9. will re-evaluate her on the next visit. Follow-up by: Lehman Prom FNP,  Dec 26, 2009 9:51 AM  Additional Follow-up for Phone Call Additional follow up Details #1::        [name] seen in office today Additional Follow-up by: Lehman Prom FNP,  Dec 27, 2009 2:26 PM

## 2010-09-28 NOTE — Assessment & Plan Note (Signed)
Summary: 2 WEEK FU FOR BP CHECK//KT  Nurse Visit   Vital Signs:  Patient profile:   60 year old female Menstrual status:  postmenopausal Temp:     97.4 degrees F oral Pulse rate:   108 / minute Pulse rhythm:   regular Resp:     20 per minute BP sitting:   194 / 94  (right arm) Cuff size:   regular  Vitals Entered By: Dutch Quint RN (August 29, 2010 9:23 AM)  CC:  BP recheck.  History of Present Illness: 08/14/10 - BP 150/100.  Clonidine increased from 0.1 to 0.2 mg. two times a day - started taking 12/23.  States taking daily.  States continuing Caduet and Diovan as ordered.  Was supposed to bring CBG and BP logs, didn't bring. Call [facility] pharmacy -- has not picked up Clonidine and Diovan-HCT since August, Caduet since June.  Pt. queried -- states she had some medications "left from before."  States her sister has been picking up her meds and refilling her bottles with the new supply.    States feels like she has a UTI- has burning while voiding.  States fasting CBG this morning was 236.   Impression & Recommendations:  Problem # 1:  HYPERTENSION, BENIGN ESSENTIAL (ICD-401.1) BP increasing States sister picks up meds -- question re compliance To pick-up meds and take as directed Return in two weeks for BP recheck with triage nurse -- bring BP and CBG logs, med bottles  Her updated medication list for this problem includes:    Diovan Hct 320-25 Mg Tabs (Valsartan-hydrochlorothiazide) ..... One tablet by mouth daily    Clonidine Hcl 0.2 Mg Tabs (Clonidine hcl) ..... One tablet by mouth two times a day for blood pressure **note increase in dose**    Caduet 10-20 Mg Tabs (Amlodipine-atorvastatin) ..... One tablet by mouth nightly for blood pressure and cholesterol  Orders: Est. Patient Level II (56213)  Problem # 2:  URINARY TRACT INFECTION, MONILIAL (ICD-112.2) Dysuria Culture sent  Orders: Est. Patient Level II (08657) UA Dipstick w/o Micro (automated)   (81003) T-Culture, Urine (84696-29528)  Complete Medication List: 1)  Diovan Hct 320-25 Mg Tabs (Valsartan-hydrochlorothiazide) .... One tablet by mouth daily 2)  Aleve 220 Mg Caps (Naproxen sodium) .... 2 tablets by mouth daily for joints 3)  Oxybutynin Chloride 5 Mg Tabs (Oxybutynin chloride) .... One tablet by mouth two times a day for bladder 4)  Lantus 100 Unit/ml Soln (Insulin glargine) .... 30 units subcutaneously in the morning and 80 units subcutaneously at night 5)  Clonidine Hcl 0.2 Mg Tabs (Clonidine hcl) .... One tablet by mouth two times a day for blood pressure **note increase in dose** 6)  Proventil Hfa 108 (90 Base) Mcg/act Aers (Albuterol sulfate) .... Two inhalations every 6 hours as needed for shortness of breath 7)  Advair Diskus 250-50 Mcg/dose Aepb (Fluticasone-salmeterol) .... One inhalation two times a day for breathing 8)  Blood Glucose Meter Kit (Blood glucose monitoring suppl) .... Use to check blood sugar three times per day 9)  Lancets Misc (Lancets) .... Use to check blood sugar three times per day dx: insulin dependent diabetes 10)  Blood Glucose Test Strp (Glucose blood) .... Use to check blood sugar three times per day dx: insulin dependent diabetes 11)  Novolog Mix 70/30 70-30 % Susp (Insulin aspart prot & aspart) .... 25 units subcutaneously in the morning and 30 units in the afternoon 12)  Dexilant 60 Mg Cpdr (Dexlansoprazole) .... One tablet by  mouth daily before breakfast **by gi** 13)  Aspirin 81 Mg Tbec (Aspirin) .... One tablet by mouth daily 14)  Caduet 10-20 Mg Tabs (Amlodipine-atorvastatin) .... One tablet by mouth nightly for blood pressure and cholesterol 15)  Diclofenac Sodium 75 Mg Tbec (Diclofenac sodium) .... One tablet by mouth two times a day for back 16)  Lyrica 50 Mg Caps (Pregabalin) .... One capsule by mouth three times a day for neuropathy 17)  Janumet 50-500 Mg Tabs (Sitagliptin-metformin hcl) .... One tablet by mouth two times a day for  diabetes 18)  Lexapro 20 Mg Tabs (Escitalopram oxalate) .... One tablet by mouth daily  **rx by [facility]** 19)  Trazodone Hcl 50 Mg Tabs (Trazodone hcl) .... One tablet by mouth nightly for sleep **rx by [facility]** 20)  Wellbutrin Sr 150 Mg Xr12h-tab (Bupropion hcl) .... One tablet by mouth two times a day **rx by psych** 21)  Ambien 10 Mg Tabs (Zolpidem tartrate) .... One tablet by mouth nightly  **rx by psych** 22)  Macrobid 100 Mg Caps (Nitrofurantoin monohyd macro) .[name].. 1 tab by mouth two times a day for 7 days   Patient Instructions: 1)  Reviewed with Dr. Delrae Alfred 2)  Your blood pressure is still elevated. Start taking your medications exactly as ordered.  Call for refills before you run out of medications.  [facility] has your medications ready for pick-up.  Use your new meds in the current bottle -- do not refill old bottles. 3)  You have a urinary tract infection.  We will notify you about your urine culture. 4)  Take Macrobid 100 mg. by mouth two times a day for 7 days.  Make sure you finish the full 7 days.  Drink plenty of water. 5)  Always wash from front to back, wear cotton or cotton-lined underwear.  Don't douche.   Avoid using scented products in your perineal hygiene. 6)  Return in two weeks for blood pressure check with triage nurse -- bring your blood pressure and blood sugar logs, along with your medication bottles with you. 7)  Call if anything changes or if you have any questions.   Review of Systems CV:  Complains of fatigue; States persistent cough.  Had headache when BP started going up last week, denies headaches on regular basis.  Denies visual changes, peripheral edema.. GU:  Complains of dysuria, incontinence, nocturia, and urinary hesitancy; Started Christmas Day. States sharp pain when starting stream, pelvic pain.  Denies fever, hematuria or frequency..   Physical Exam  General:  alert, well-developed, well-nourished, and well-hydrated.     CC: BP recheck Is Patient Diabetic? Yes Did you bring your meter with you today? No Pain Assessment Patient in pain? yes     Location: urinary Type: sharp Onset of pain  when beginning urination  Does patient need assistance? Functional Status Self care Ambulation Normal `  Allergies: No Known Drug Allergies Laboratory Results   Urine Tests  Date/Time Received: August 29, 2010 9:39 AM   Routine Urinalysis   Color: dk. red Glucose: 250   (Normal Range: Negative) Bilirubin: negative   (Normal Range: Negative) Ketone: small (15)   (Normal Range: Negative) Spec. Gravity: >=1.030   (Normal Range: 1.003-1.035) Blood: trace-intact   (Normal Range: Negative) pH: 6.0   (Normal Range: 5.0-8.0) Protein: 100   (Normal Range: Negative) Urobilinogen: 1.0   (Normal Range: 0-1) Nitrite: positive   (Normal Range: Negative) Leukocyte Esterace: large   (Normal Range: Negative)  Orders Added: 1)  Est. Patient Level II [16109] 2)  UA Dipstick w/o Micro (automated)  [81003] 3)  T-Culture, Urine [[id number]] Prescriptions: MACROBID 100 MG CAPS (NITROFURANTOIN MONOHYD MACRO) 1 tab by mouth two times a day for 7 days  #14 x 0   Entered by:   Dutch Quint RN   Authorized by:   Julieanne Manson MD   Signed by:   Dutch Quint RN on 08/29/2010   Method used:   Faxed to ...       [facility] (retail)       [address] [place], [place]  [address]       Ph: [phone]       Fax: [phone]   RxID:   [id number]

## 2010-09-28 NOTE — Assessment & Plan Note (Signed)
Summary: Diabetes   Vital Signs:  Patient profile:   60 year old female Menstrual status:  postmenopausal Weight:      198.6 pounds BMI:     37.66 Temp:     97.1 degrees F Pulse rate:   88 / minute Pulse rhythm:   regular Resp:     20 per minute BP sitting:   150 / 100  (left arm) Cuff size:   regular  Vitals Entered By: Levon Hedger (August 14, 2010 11:00 AM)  Nutrition Counseling: Patient's BMI is greater than 25 and therefore counseled on weight management options. CC: follow-up visit DM, BP, Hypertension Management, Abdominal Pain, Depression Is Patient Diabetic? Yes Pain Assessment Patient in pain? no      CBG Result 315  Does patient need assistance? Functional Status Self care Ambulation Normal   CC:  follow-up visit DM, BP, Hypertension Management, Abdominal Pain, and Depression.  History of Present Illness:  Pt into the office for diabetes f/u.  Esphageal stricture - Dx by barium swallow  Dilatation done by Dr. Loreta Ave. F/u appt in 09/2010   Obesity - down 3 pounds since her last visit The patient denies that she feels like life is not worth living, denies that she wishes that she were dead, and denies that she has thought about ending her life.         Depression Treatment History:  Prior Medication Used:   Start Date: Assessment of Effect:   Comments:  savella     --     some improvement     off Wellbutrin (bupropion)     02/28/2010     --       started by Psych  Diabetes Management History:      The patient is a 60 years old female who comes in for evaluation of Type 2 Diabetes Mellitus.  She is (or has been) enrolled in the "Diabetic Education Program".  She states understanding of dietary principles and is following her diet appropriately.  Sensory loss is noted.  Self foot exams are not being performed.  She is checking home blood sugars.  She says that she is exercising.  Type of exercise includes: walking.  Duration of exercise is estimated to  be 15 min.  She is doing this 3 times per week.        Hypoglycemic symptoms are not occurring.  No hyperglycemic symptoms are reported.  Other comments include: Pt has started on new meds by Psych.        Symptoms which suggest diabetic complications include paresthesias.  The following changes have been made to her treatment plan since last visit: medication changes.  Treatment plan changes were initiated by MD.    Dyspepsia History:      She has no alarm features of dyspepsia including no history of melena, hematochezia, dysphagia, persistent vomiting, or involuntary weight loss > 5%.  There is a prior history of GERD.  The patient does not have a prior history of documented ulcer disease.  The dominant symptom is not heartburn or acid reflux.  An H-2 blocker medication is not currently being taken.  She has no history of a positive H. Pylori serology.  A prior EGD has been done which showed no evidence for moderate or severe esophagitis or Barrett's esophagus.    Hypertension History:      She denies headache, chest pain, and palpitations.  She notes no problems with any antihypertensive medication side effects.  Pt is taking  meds as ordered.  Bp has been elevated since pt has started meds by Psych.        Positive major cardiovascular risk factors include female age 96 years old or older, diabetes, and hypertension.  Negative major cardiovascular risk factors include non-tobacco-user status.        Further assessment for target organ damage reveals no history of ASHD, cardiac end-organ damage (CHF/LVH), stroke/TIA, peripheral vascular disease, renal insufficiency, or hypertensive retinopathy.       Medications Prior to Update: 1)  Diovan Hct 320-25 Mg Tabs (Valsartan-Hydrochlorothiazide) .... One Tablet By Mouth Daily 2)  Aleve 220 Mg Caps (Naproxen Sodium) .... 2 Tablets By Mouth Daily For Joints 3)  Oxybutynin Chloride 5 Mg Tabs (Oxybutynin Chloride) .... One Tablet By Mouth Two Times A Day  For Bladder 4)  Lantus 100 Unit/ml Soln (Insulin Glargine) .... 30 Units Subcutaneously in The Morning and 80 Units Subcutaneously At Night 5)  Clonidine Hcl 0.1 Mg Tabs (Clonidine Hcl) .... One Tablet By Mouth Two Times A Day For Blood Pressure 6)  Proventil Hfa 108 (90 Base) Mcg/act Aers (Albuterol Sulfate) .... Two Inhalations Every 6 Hours As Needed For Shortness of Breath 7)  Advair Diskus 250-50 Mcg/dose Aepb (Fluticasone-Salmeterol) .... One Inhalation Two Times A Day For Breathing 8)  Blood Glucose Meter  Kit (Blood Glucose Monitoring Suppl) .... Use To Check Blood Sugar Three Times Per Day 9)  Lancets  Misc (Lancets) .... Use To Check Blood Sugar Three Times Per Day Dx: Insulin Dependent Diabetes 10)  Blood Glucose Test  Strp (Glucose Blood) .... Use To Check Blood Sugar Three Times Per Day Dx: Insulin Dependent Diabetes 11)  Novolog Mix 70/30 70-30 % Susp (Insulin Aspart Prot & Aspart) .... 20 Units Subcutaneously in The Morning and 25 Units in The Afternoon 12)  Omeprazole 20 Mg Tbec (Omeprazole) .... One Capsule By Mouth Daily Before Breakfast 13)  Aspirin 81 Mg Tbec (Aspirin) .... One Tablet By Mouth Daily 14)  Caduet 10-20 Mg Tabs (Amlodipine-Atorvastatin) .... One Tablet By Mouth Nightly For Blood Pressure and Cholesterol 15)  Diclofenac Sodium 75 Mg Tbec (Diclofenac Sodium) .... One Tablet By Mouth Two Times A Day For Back 16)  Lyrica 50 Mg Caps (Pregabalin) .... One Capsule By Mouth Three Times A Day For Neuropathy 17)  Vicodin 5-500 Mg Tabs (Hydrocodone-Acetaminophen) .... One Tablet By Mouth Daily As Needed For Pain 18)  Cymbalta 30 Mg Cpep (Duloxetine Hcl) .... 2 Capsules By Mouth Daily **rx By Psych** 19)  Janumet 50-500 Mg Tabs (Sitagliptin-Metformin Hcl) .... One Tablet By Mouth Two Times A Day For Diabetes  Current Medications (verified): 1)  Diovan Hct 320-25 Mg Tabs (Valsartan-Hydrochlorothiazide) .... One Tablet By Mouth Daily 2)  Aleve 220 Mg Caps (Naproxen Sodium)  .... 2 Tablets By Mouth Daily For Joints 3)  Oxybutynin Chloride 5 Mg Tabs (Oxybutynin Chloride) .... One Tablet By Mouth Two Times A Day For Bladder 4)  Lantus 100 Unit/ml Soln (Insulin Glargine) .... 30 Units Subcutaneously in The Morning and 80 Units Subcutaneously At Night 5)  Clonidine Hcl 0.2 Mg Tabs (Clonidine Hcl) .... One Tablet By Mouth Two Times A Day For Blood Pressure **note Increase in Dose** 6)  Proventil Hfa 108 (90 Base) Mcg/act Aers (Albuterol Sulfate) .... Two Inhalations Every 6 Hours As Needed For Shortness of Breath 7)  Advair Diskus 250-50 Mcg/dose Aepb (Fluticasone-Salmeterol) .... One Inhalation Two Times A Day For Breathing 8)  Blood Glucose Meter  Kit (Blood Glucose Monitoring Suppl) .Gwendolyn KitchenMarland KitchenMarland [name]  Use To Check Blood Sugar Three Times Per Day 9)  Lancets  Misc (Lancets) .... Use To Check Blood Sugar Three Times Per Day Dx: Insulin Dependent Diabetes 10)  Blood Glucose Test  Strp (Glucose Blood) .... Use To Check Blood Sugar Three Times Per Day Dx: Insulin Dependent Diabetes 11)  Novolog Mix 70/30 70-30 % Susp (Insulin Aspart Prot & Aspart) .... 25 Units Subcutaneously in The Morning and 30 Units in The Afternoon 12)  Dexilant 60 Mg Cpdr (Dexlansoprazole) .... One Tablet By Mouth Daily Before Breakfast **by Gi** 13)  Aspirin 81 Mg Tbec (Aspirin) .... One Tablet By Mouth Daily 14)  Caduet 10-20 Mg Tabs (Amlodipine-Atorvastatin) .... One Tablet By Mouth Nightly For Blood Pressure and Cholesterol 15)  Diclofenac Sodium 75 Mg Tbec (Diclofenac Sodium) .... One Tablet By Mouth Two Times A Day For Back 16)  Lyrica 50 Mg Caps (Pregabalin) .... One Capsule By Mouth Three Times A Day For Neuropathy 17)  Janumet 50-500 Mg Tabs (Sitagliptin-Metformin Hcl) .... One Tablet By Mouth Two Times A Day For Diabetes 18)  Lexapro 20 Mg Tabs (Escitalopram Oxalate) .... One Tablet By Mouth Daily  **rx By [facility]** 19)  Trazodone Hcl 50 Mg Tabs (Trazodone Hcl) .... One Tablet By Mouth Nightly For  Sleep **rx By [facility]** 20)  Wellbutrin Sr 150 Mg Xr12h-Tab (Bupropion Hcl) .... One Tablet By Mouth Two Times A Day **rx By Psych** 21)  Ambien 10 Mg Tabs (Zolpidem Tartrate) .... One Tablet By Mouth Nightly  **rx By Merilyn Baba**  Allergies (verified): No Known Drug Allergies  Review of Systems General:  Denies fever. CV:  Denies chest pain or discomfort. Resp:  Denies cough. GI:  Denies abdominal pain, nausea, and vomiting. MS:  Complains of joint pain; left shoulder - I'm dealing with it. Neuro:  Complains of numbness and tingling; started lyrica since last visit here - improvement in tingling since she has started lyrica.  Physical Exam  General:  alert.   Head:  normocephalic.   Eyes:  glasses Ears:  ear piercing(s) noted.   Lungs:  normal breath sounds.   Heart:  normal rate and regular rhythm.   Abdomen:  normal bowel sounds.   Msk:  up to the exam table Neurologic:  alert & oriented X3.    Diabetes Management Exam:    Foot Exam (with socks and/or shoes not present):       Sensory-Monofilament:          Left foot: normal          Right foot: normal       Nails:          Left foot: thickened          Right foot: thickened   Impression & Recommendations:  Problem # 1:  DIABETES MELLITUS (ICD-250.00) still uncontrolled - will increase insulin  hgba1c = 11.5 advised diet Her updated medication list for this problem includes:    Diovan Hct 320-25 Mg Tabs (Valsartan-hydrochlorothiazide) ..... One tablet by mouth daily    Lantus 100 Unit/ml Soln (Insulin glargine) .Gwendolyn KitchenMarland KitchenMarland KitchenMarland [name] 30 units subcutaneously in the morning and 80 units subcutaneously at night    Novolog Mix 70/30 70-30 % Susp (Insulin aspart prot & aspart) .[name]... 25 units subcutaneously in the morning and 30 units in the afternoon    Aspirin 81 Mg Tbec (Aspirin) ..... One tablet by mouth daily    Janumet 50-500 Mg Tabs (Sitagliptin-metformin hcl) ..... One tablet by mouth two times a day  for diabetes  Orders: UA  Dipstick w/o Micro (manual) (40981) Hemoglobin A1C (83036) Capillary Blood Glucose/CBG (19147) T-Urine Microalbumin w/creat. ratio [phone])  Problem # 2:  HYPERTENSION, BENIGN ESSENTIAL (ICD-401.1) BP is elevated will need to increase clonidine Her updated medication list for this problem includes:    Diovan Hct 320-25 Mg Tabs (Valsartan-hydrochlorothiazide) ..... One tablet by mouth daily    Clonidine Hcl 0.2 Mg Tabs (Clonidine hcl) ..... One tablet by mouth two times a day for blood pressure **note increase in dose**    Caduet 10-20 Mg Tabs (Amlodipine-atorvastatin) ..... One tablet by mouth nightly for blood pressure and cholesterol  Problem # 3:  GERD (ICD-530.81) started on new meds by GI Her updated medication list for this problem includes:    Dexilant 60 Mg Cpdr (Dexlansoprazole) ..... One tablet by mouth daily before breakfast **by gi**  Problem # 4:  Hx of ESOPHAGEAL STRICTURE (ICD-530.3) dilatation done by Dr. Loreta Ave pt to f/u there in 09/2010  Problem # 5:  OBESITY (ICD-278.00) down 3 pounds since the last visit  Problem # 6:  DEPRESSION (ICD-311)  The following medications were removed from the medication list:    Cymbalta 30 Mg Cpep (Duloxetine hcl) .[name]... 2 capsules by mouth daily **rx by psych** Her updated medication list for this problem includes:    Lexapro 20 Mg Tabs (Escitalopram oxalate) ..... One tablet by mouth daily  **rx by [facility]**    Trazodone Hcl 50 Mg Tabs (Trazodone hcl) ..... One tablet by mouth nightly for sleep **rx by [facility]**    Wellbutrin Sr 150 Mg Xr12h-tab (Bupropion hcl) ..... One tablet by mouth two times a day **rx by psych**  Complete Medication List: 1)  Diovan Hct 320-25 Mg Tabs (Valsartan-hydrochlorothiazide) .... One tablet by mouth daily 2)  Aleve 220 Mg Caps (Naproxen sodium) .... 2 tablets by mouth daily for joints 3)  Oxybutynin Chloride 5 Mg Tabs (Oxybutynin chloride) .... One tablet by mouth two times a  day for bladder 4)  Lantus 100 Unit/ml Soln (Insulin glargine) .... 30 units subcutaneously in the morning and 80 units subcutaneously at night 5)  Clonidine Hcl 0.2 Mg Tabs (Clonidine hcl) .... One tablet by mouth two times a day for blood pressure **note increase in dose** 6)  Proventil Hfa 108 (90 Base) Mcg/act Aers (Albuterol sulfate) .... Two inhalations every 6 hours as needed for shortness of breath 7)  Advair Diskus 250-50 Mcg/dose Aepb (Fluticasone-salmeterol) .... One inhalation two times a day for breathing 8)  Blood Glucose Meter Kit (Blood glucose monitoring suppl) .... Use to check blood sugar three times per day 9)  Lancets Misc (Lancets) .... Use to check blood sugar three times per day dx: insulin dependent diabetes 10)  Blood Glucose Test Strp (Glucose blood) .... Use to check blood sugar three times per day dx: insulin dependent diabetes 11)  Novolog Mix 70/30 70-30 % Susp (Insulin aspart prot & aspart) .... 25 units subcutaneously in the morning and 30 units in the afternoon 12)  Dexilant 60 Mg Cpdr (Dexlansoprazole) .... One tablet by mouth daily before breakfast **by gi** 13)  Aspirin 81 Mg Tbec (Aspirin) .... One tablet by mouth daily 14)  Caduet 10-20 Mg Tabs (Amlodipine-atorvastatin) .... One tablet by mouth nightly for blood pressure and cholesterol 15)  Diclofenac Sodium 75 Mg Tbec (Diclofenac sodium) .... One tablet by mouth two times a day for back 16)  Lyrica 50 Mg Caps (Pregabalin) .... One capsule by mouth three times a  day for neuropathy 17)  Janumet 50-500 Mg Tabs (Sitagliptin-metformin hcl) .... One tablet by mouth two times a day for diabetes 18)  Lexapro 20 Mg Tabs (Escitalopram oxalate) .... One tablet by mouth daily  **rx by [facility]** 19)  Trazodone Hcl 50 Mg Tabs (Trazodone hcl) .... One tablet by mouth nightly for sleep **rx by [facility]** 20)  Wellbutrin Sr 150 Mg Xr12h-tab (Bupropion hcl) .... One tablet by mouth two times a day **rx by  psych** 21)  Ambien 10 Mg Tabs (Zolpidem tartrate) .... One tablet by mouth nightly  **rx by psych**  Diabetes Management Assessment/Plan:      The following lipid goals have been established for the patient: Total cholesterol goal of 200; LDL cholesterol goal of 100; HDL cholesterol goal of 40; Triglyceride goal of 150.  Her blood pressure goal is < 140/90.    Hypertension Assessment/Plan:      The patient's hypertensive risk group is category C: Target organ damage and/or diabetes.  Her calculated 10 year risk of coronary heart disease is 17 %.  Today's blood pressure is 150/100.  Her blood pressure goal is < 140/90.  Patient Instructions: 1)  Blood pressure and blood sugar is elevated 2)  May be due to new meds by the [facility]. 3)  Your Hgba1c = 11.5 today. 4)  will increase clonidine to 0.2mg  by mouth two times a day  5)  Increase insulin Novolog 25 units subcutaneously in the morning and 30 units subcutaneously at night. 6)  Follow up in 2 weeks for triage visit - blood pressure check 7)  Take your medications before this visit. 8)  Bring your blood sugar and blood pressure log with you to this visit for Cantrell Larouche review. 9)  Keep your appointment with the [facility] and Dr. Loreta Ave in February. 10)  Follow up with n.martin,fnp for diabetes in 3 months. You will need cbg, hgba1c, u/a 11)  Other things to consider is a Ultrasound to check for renal artery stenosis as the cause for blood pressure. Also may need referral to endocrinologist for better diabetes control. 12)  You are doing everything that has been requested.  Keep up the good work. Prescriptions: CLONIDINE HCL 0.2 MG TABS (CLONIDINE HCL) One tablet by mouth two times a day for blood pressure **note increase in dose**  #60 x 5   Entered and Authorized by:   Lehman Prom FNP   Signed by:   Lehman Prom FNP on 08/14/2010   Method used:   Faxed to ...       [facility] - [facility] (retail)        [address] [place], [place]  [address]       Ph: [phone]       Fax: [phone]   RxID:   [id number] ADVAIR DISKUS 250-50 MCG/DOSE AEPB (FLUTICASONE-SALMETEROL) One inhalation two times a day for breathing  #1 x 5   Entered and Authorized by:   Lehman Prom FNP   Signed by:   Lehman Prom FNP on 08/14/2010   Method used:   Faxed to ...       [facility] - [facility] (retail)       [address] [place], [place]  [address]       Ph: [phone]       Fax: [phone]   RxID:   [id number]    Orders  Added: 1)  Est. Patient Level IV [81191] 2)  UA Dipstick w/o Micro (manual) [81002] 3)  Hemoglobin A1C [83036] 4)  Capillary Blood Glucose/CBG [82948] 5)  T-Urine Microalbumin w/creat. ratio [[id number]]    Laboratory Results   Urine Tests  Date/Time Received: August 14, 2010 11:11 AM   Routine Urinalysis   Color: lt. yellow Appearance: Clear Glucose: >=1000   (Normal Range: Negative) Bilirubin: negative   (Normal Range: Negative) Ketone: negative   (Normal Range: Negative) Spec. Gravity: 1.010   (Normal Range: 1.003-1.035) Blood: negative   (Normal Range: Negative) pH: 5.5   (Normal Range: 5.0-8.0) Protein: negative   (Normal Range: Negative) Urobilinogen: 0.2   (Normal Range: 0-1) Nitrite: negative   (Normal Range: Negative) Leukocyte Esterace: negative   (Normal Range: Negative)     Blood Tests   Date/Time Received: August 14, 2010 11:16 AM   HGBA1C: 11.5%   (Normal Range: Non-Diabetic - 3-6%   Control Diabetic - 6-8%) CBG Random:: 315       Last LDL:                                                 119 (09/22/2009 9:06:00 PM)        Diabetic Foot Exam    10-g (5.07) Semmes-Weinstein Monofilament Test Performed by: Levon Hedger          Right Foot          Left Foot Visual Inspection               Test Control      normal         normal Site 1         normal          normal Site 2         normal         normal Site 3         normal         normal Site 4         normal         normal Site 5         normal         normal Site 6         normal         normal Site 7         normal         normal Site 8         normal         normal Site 9         normal         normal Site 10         normal         normal  Impression      normal         normal   Laboratory Results   Urine Tests    Routine Urinalysis   Color: lt. yellow Appearance: Clear Glucose: >=1000   (Normal Range: Negative) Bilirubin: negative   (Normal Range: Negative) Ketone: negative   (Normal Range: Negative) Spec. Gravity: 1.010   (Normal Range: 1.003-1.035) Blood: negative   (Normal Range: Negative) pH: 5.5   (Normal Range: 5.0-8.0) Protein: negative   (Normal Range: Negative) Urobilinogen: 0.2   (Normal Range:  0-1) Nitrite: negative   (Normal Range: Negative) Leukocyte Esterace: negative   (Normal Range: Negative)     Blood Tests     HGBA1C: 11.5%   (Normal Range: Non-Diabetic - 3-6%   Control Diabetic - 6-8%) CBG Random:: 315mg /dL

## 2010-10-02 ENCOUNTER — Encounter (INDEPENDENT_AMBULATORY_CARE_PROVIDER_SITE_OTHER): Payer: Self-pay | Admitting: Nurse Practitioner

## 2010-10-04 NOTE — Letter (Signed)
Summary: [facility]  [facility]   Imported By: Arta Bruce 09/26/2010 09:51:07  _____________________________________________________________________  External Attachment:    Type:   Image     Comment:   External Document

## 2010-10-12 NOTE — Letter (Signed)
Summary: FAXED REQUESTED RECORDS TO THE [facility]  FAXED REQUESTED RECORDS TO THE [facility]   Imported By: Arta Bruce 10/02/2010 15:08:43  _____________________________________________________________________  External Attachment:    Type:   Image     Comment:   External Document

## 2010-11-08 LAB — GLUCOSE, CAPILLARY: Glucose-Capillary: 277 mg/dL — ABNORMAL HIGH (ref 70–99)

## 2010-11-17 ENCOUNTER — Encounter (INDEPENDENT_AMBULATORY_CARE_PROVIDER_SITE_OTHER): Payer: Self-pay | Admitting: Nurse Practitioner

## 2010-11-17 ENCOUNTER — Encounter: Payer: Self-pay | Admitting: Nurse Practitioner

## 2010-11-17 LAB — CONVERTED CEMR LAB
Blood Glucose, Fingerstick: 302
Glucose, Urine, Semiquant: 100
Protein, U semiquant: 100
Specific Gravity, Urine: >=1.03

## 2010-11-22 ENCOUNTER — Telehealth (INDEPENDENT_AMBULATORY_CARE_PROVIDER_SITE_OTHER): Payer: Self-pay | Admitting: Nurse Practitioner

## 2010-11-22 ENCOUNTER — Encounter (INDEPENDENT_AMBULATORY_CARE_PROVIDER_SITE_OTHER): Payer: Self-pay | Admitting: *Deleted

## 2010-11-28 NOTE — Letter (Signed)
Summary: *HSN Results Follow up  [facility]  [address] [place], [place] [address]   Phone: [phone]  Fax: [phone]      11/22/2010   GABREILLE DARDIS [address] [place], [place]  [address]   Dear  Ms. Jannely Merfeld,                            ____S.Drinkard,FNP   ____D. Gore,FNP       ____B. McPherson,MD   ____V. Rankins,MD    ____E. Mulberry,MD    ____N. Daphine Deutscher, FNP  ____D. Reche Dixon, MD    ____K. Philipp Deputy, MD    ____Other     This letter is to inform you that your recent test(s):       ___X____ requires a medication change       Comments:  We have been trying to reach you at [phone].  Please contact the office at your earliest convenience.       _________________________________________________________ If you have any questions, please contact our office                     Sincerely,  Levon Hedger [facility]

## 2010-11-28 NOTE — Progress Notes (Signed)
Summary: Insulin change  Phone Note Outgoing Call   Summary of Call: notify pt that the pharmacy is NOT able to get the 'new" insulin that i started pt on. tell her to keep taking lantus 30 units in the morning and 80 units at night.  Instead of 70/30 I am going to change her to Novolog - 20 units subcutaneously three times a day with meals.  I have made the pharmacy aware of those changes Initial call taken by: Lehman Prom FNP,  November 22, 2010 9:36 AM  Follow-up for Phone Call        called [phone] this number has been disconnected. Will mail letter. Follow-up by: Levon Hedger,  November 22, 2010 9:41 AM    New/Updated Medications: LANTUS SOLOSTAR 100 UNIT/ML SOLN (INSULIN GLARGINE) 30 units in the mornin and 80 units at night for diabetes NOVOLOG 100 UNIT/ML SOLN (INSULIN ASPART) 20 units subcutaneously three times a day before meals **pharmacy - D/C 70/30** Prescriptions: LANTUS SOLOSTAR 100 UNIT/ML SOLN (INSULIN GLARGINE) 30 units in the mornin and 80 units at night for diabetes  #1 month qs x 11   Entered and Authorized by:   Lehman Prom FNP   Signed by:   Lehman Prom FNP on 11/22/2010   Method used:   Faxed to ...       [facility] - [facility] (retail)       [address] [place], [place]  [address]       Ph: [phone]       Fax: [phone]   RxID:   [id number] NOVOLOG 100 UNIT/ML SOLN (INSULIN ASPART) 20 units subcutaneously three times a day before meals **pharmacy - D/C 70/30**  #1 month qs x 11   Entered and Authorized by:   Lehman Prom FNP   Signed by:   Lehman Prom FNP on 11/22/2010   Method used:   Faxed to ...       [facility] - Pharmac (retail)       [address] [place], [place]  [address]       Ph: [phone]       Fax: [phone]   RxID:   [id number]   Appended Document: Insulin change pt aware

## 2010-11-28 NOTE — Assessment & Plan Note (Signed)
Summary: Diabetes/HTN   Vital Signs:  Patient profile:   60 year old female Menstrual status:  postmenopausal Weight:      190.50 pounds Temp:     98.4 degrees F oral Pulse rate:   90 / minute Pulse rhythm:   regular Resp:     20 per minute BP sitting:   117 / 74  (left arm) Cuff size:   regular  Vitals Entered By: Hale Drone CMA (November 17, 2010 12:30 PM) CC: 3 month f/u on DM, Hypertension Management, Abdominal Pain, Depression Is Patient Diabetic? Yes Pain Assessment Patient in pain? no      CBG Result 302 CBG Device ID N non fasting  Does patient need assistance? Functional Status Self care Ambulation Normal   CC:  3 month f/u on DM, Hypertension Management, Abdominal Pain, and Depression.  History of Present Illness:  Pt into the office for diabetes  Obesity - down 9 pounds since the last visit  Depression History:      The patient is having a depressed mood most of the day and has a diminished interest in her usual daily activities.  Positive alarm features for depression include insomnia and feelings of worthlessness (guilt).  However, she denies recurrent thoughts of death or suicide.        Psychosocial stress factors include major life changes.  The patient denies that she feels like life is not worth living, denies that she wishes that she were dead, and denies that she has thought about ending her life.        Comments:  Since last visit here pt was forced to move out of her appt. She is living with family and friends.  Pt is still going to the [facility].  Depression Treatment History:  Prior Medication Used:   Start Date: Assessment of Effect:   Comments:  savella     --     some improvement     off Wellbutrin (bupropion)     02/28/2010     --       started by Psych  Diabetes Management History:      The patient is a 60 years old female who comes in for evaluation of Type 2 Diabetes Mellitus.  She is (or has been) enrolled in the "Diabetic  Education Program".  She states understanding of dietary principles and is following her diet appropriately.  No sensory loss is reported.  Self foot exams are not being performed.  She is checking home blood sugars.  She says that she is exercising.  Type of exercise includes: walking.  Duration of exercise is estimated to be 15 min.  She is doing this 3 times per week.        Other comments include: pt presents today with blood sugar log - still elevated values.    Dyspepsia History:      She has no alarm features of dyspepsia including no history of melena, hematochezia, dysphagia, persistent vomiting, or involuntary weight loss > 5%.  There is a prior history of GERD.  The patient does not have a prior history of documented ulcer disease.  The dominant symptom is not heartburn or acid reflux.  An H-2 blocker medication is not currently being taken.    Hypertension History:      She denies headache, chest pain, and palpitations.  She notes no problems with any antihypertensive medication side effects.        Positive major cardiovascular risk factors include female  age 66 years old or older, diabetes, and hypertension.  Negative major cardiovascular risk factors include non-tobacco-user status.        Further assessment for target organ damage reveals no history of ASHD, cardiac end-organ damage (CHF/LVH), stroke/TIA, peripheral vascular disease, renal insufficiency, or hypertensive retinopathy.       Current Medications (verified): 1)  Diovan Hct 320-25 Mg Tabs (Valsartan-Hydrochlorothiazide) .... One Tablet By Mouth Daily 2)  Aleve 220 Mg Caps (Naproxen Sodium) .... 2 Tablets By Mouth Daily For Joints 3)  Oxybutynin Chloride 5 Mg Tabs (Oxybutynin Chloride) .... One Tablet By Mouth Two Times A Day For Bladder 4)  Lantus 100 Unit/ml Soln (Insulin Glargine) .... 30 Units Subcutaneously in The Morning and 80 Units Subcutaneously At Night 5)  Clonidine Hcl 0.2 Mg Tabs (Clonidine Hcl) .... One  Tablet By Mouth Two Times A Day For Blood Pressure **note Increase in Dose** 6)  Proventil Hfa 108 (90 Base) Mcg/act Aers (Albuterol Sulfate) .... Two Inhalations Every 6 Hours As Needed For Shortness of Breath 7)  Advair Diskus 250-50 Mcg/dose Aepb (Fluticasone-Salmeterol) .... One Inhalation Two Times A Day For Breathing 8)  Blood Glucose Meter  Kit (Blood Glucose Monitoring Suppl) .... Use To Check Blood Sugar Three Times Per Day 9)  Lancets  Misc (Lancets) .... Use To Check Blood Sugar Three Times Per Day Dx: Insulin Dependent Diabetes 10)  Blood Glucose Test  Strp (Glucose Blood) .... Use To Check Blood Sugar Three Times Per Day Dx: Insulin Dependent Diabetes 11)  Novolog Mix 70/30 70-30 % Susp (Insulin Aspart Prot & Aspart) .... 25 Units Subcutaneously in The Morning and 30 Units in The Afternoon 12)  Dexilant 60 Mg Cpdr (Dexlansoprazole) .... One Tablet By Mouth Daily Before Breakfast **by Gi** 13)  Aspirin 81 Mg Tbec (Aspirin) .... One Tablet By Mouth Daily 14)  Caduet 10-20 Mg Tabs (Amlodipine-Atorvastatin) .... One Tablet By Mouth Nightly For Blood Pressure and Cholesterol 15)  Diclofenac Sodium 75 Mg Tbec (Diclofenac Sodium) .... One Tablet By Mouth Two Times A Day For Back 16)  Lyrica 50 Mg Caps (Pregabalin) .... One Capsule By Mouth Three Times A Day For Neuropathy 17)  Janumet 50-500 Mg Tabs (Sitagliptin-Metformin Hcl) .... One Tablet By Mouth Two Times A Day For Diabetes 18)  Lexapro 20 Mg Tabs (Escitalopram Oxalate) .... One Tablet By Mouth Daily  **rx By [facility]** 19)  Trazodone Hcl 50 Mg Tabs (Trazodone Hcl) .... One Tablet By Mouth Nightly For Sleep **rx By [facility]** 20)  Wellbutrin Sr 150 Mg Xr12h-Tab (Bupropion Hcl) .... One Tablet By Mouth Two Times A Day **rx By Psych** 21)  Ambien 10 Mg Tabs (Zolpidem Tartrate) .... One Tablet By Mouth Nightly  **rx By Psych** 22)  Macrobid 100 Mg Caps (Nitrofurantoin Monohyd Macro) .[name].. 1 Tab By Mouth Two Times A Day For 7  Days 23)  Zoloft 100 Mg Tabs (Sertraline Hcl) .... [facility]  Allergies (verified): No Known Drug Allergies  Review of Systems General:  Denies fever. CV:  Denies chest pain or discomfort. Resp:  Denies cough. GI:  Denies abdominal pain, nausea, and vomiting. Psych:  Complains of depression.  Physical Exam  General:  alert and overweight-appearing.   Head:  normocephalic.   Eyes:  glasses Lungs:  scattered wheezes with inspiration Heart:  normal rate and regular rhythm.   Abdomen:  normal bowel sounds.   Neurologic:  alert & oriented X3.   Skin:  color normal.   Psych:  Oriented  X3.    Diabetes Management Exam:    Foot Exam (with socks and/or shoes not present):       Sensory-Monofilament:          Left foot: normal          Right foot: normal   Impression & Recommendations:  Problem # 1:  DIABETES MELLITUS (ICD-250.00) Pt is absolutely still uncontrolled with diabetes. will put pt on Humulin R - concentrated.  will need to see if pt can get from the pharmacy The following medications were removed from the medication list:    Lantus 100 Unit/ml Soln (Insulin glargine) .Marland KitchenMarland KitchenMarland KitchenMarland Kitchen 30 units subcutaneously in the morning and 80 units subcutaneously at night    Novolog Mix 70/30 70-30 % Susp (Insulin aspart prot & aspart) .[name]... 25 units subcutaneously in the morning and 30 units in the afternoon Her updated medication list for this problem includes:    Diovan Hct 320-25 Mg Tabs (Valsartan-hydrochlorothiazide) ..... One tablet by mouth daily    Aspirin 81 Mg Tbec (Aspirin) ..... One tablet by mouth daily    Janumet 50-500 Mg Tabs (Sitagliptin-metformin hcl) ..... One tablet by mouth two times a day for diabetes    Humulin R U-500 (concentrated) 500 Unit/ml Soln (Insulin regular human) .Marland KitchenMarland KitchenMarland KitchenMarland Kitchen 30 units with breafast, 20 units with lunch and 20 units with dinner for blood sugars and sliding scale at night  Orders: Capillary Blood Glucose/CBG (82948) Hgb A1C  (16109UE)  Problem # 2:  HYPERTENSION, BENIGN ESSENTIAL (ICD-401.1)  Her updated medication list for this problem includes:    Diovan Hct 320-25 Mg Tabs (Valsartan-hydrochlorothiazide) ..... One tablet by mouth daily    Clonidine Hcl 0.2 Mg Tabs (Clonidine hcl) ..... One tablet by mouth two times a day for blood pressure **note increase in dose**    Caduet 10-20 Mg Tabs (Amlodipine-atorvastatin) ..... One tablet by mouth nightly for blood pressure and cholesterol  Problem # 3:  DEPRESSION (ICD-311)  Her updated medication list for this problem includes:    Lexapro 20 Mg Tabs (Escitalopram oxalate) ..... One tablet by mouth daily  **rx by [facility]**    Trazodone Hcl 50 Mg Tabs (Trazodone hcl) ..... One tablet by mouth nightly for sleep **rx by [facility]**    Wellbutrin Sr 150 Mg Xr12h-tab (Bupropion hcl) ..... One tablet by mouth two times a day **rx by psych**    Zoloft 100 Mg Tabs (Sertraline hcl) .[name]... [facility]  Complete Medication List: 1)  Diovan Hct 320-25 Mg Tabs (Valsartan-hydrochlorothiazide) .... One tablet by mouth daily 2)  Aleve 220 Mg Caps (Naproxen sodium) .... 2 tablets by mouth daily for joints 3)  Oxybutynin Chloride 5 Mg Tabs (Oxybutynin chloride) .... One tablet by mouth two times a day for bladder 4)  Clonidine Hcl 0.2 Mg Tabs (Clonidine hcl) .... One tablet by mouth two times a day for blood pressure **note increase in dose** 5)  Proventil Hfa 108 (90 Base) Mcg/act Aers (Albuterol sulfate) .... Two inhalations every 6 hours as needed for shortness of breath 6)  Advair Diskus 250-50 Mcg/dose Aepb (Fluticasone-salmeterol) .... One inhalation two times a day for breathing 7)  Blood Glucose Meter Kit (Blood glucose monitoring suppl) .... Use to check blood sugar three times per day 8)  Lancets Misc (Lancets) .... Use to check blood sugar three times per day dx: insulin dependent diabetes 9)  Blood Glucose Test Strp (Glucose blood) .... Use to check  blood sugar three times per day dx: insulin dependent  diabetes 10)  Dexilant 60 Mg Cpdr (Dexlansoprazole) .... One tablet by mouth daily before breakfast **by gi** 11)  Aspirin 81 Mg Tbec (Aspirin) .... One tablet by mouth daily 12)  Caduet 10-20 Mg Tabs (Amlodipine-atorvastatin) .... One tablet by mouth nightly for blood pressure and cholesterol 13)  Diclofenac Sodium 75 Mg Tbec (Diclofenac sodium) .... One tablet by mouth two times a day for back 14)  Lyrica 50 Mg Caps (Pregabalin) .... One capsule by mouth three times a day for neuropathy 15)  Janumet 50-500 Mg Tabs (Sitagliptin-metformin hcl) .... One tablet by mouth two times a day for diabetes 16)  Lexapro 20 Mg Tabs (Escitalopram oxalate) .... One tablet by mouth daily  **rx by [facility]** 17)  Trazodone Hcl 50 Mg Tabs (Trazodone hcl) .... One tablet by mouth nightly for sleep **rx by [facility]** 18)  Wellbutrin Sr 150 Mg Xr12h-tab (Bupropion hcl) .... One tablet by mouth two times a day **rx by psych** 19)  Ambien 10 Mg Tabs (Zolpidem tartrate) .... One tablet by mouth nightly  **rx by psych** 20)  Macrobid 100 Mg Caps (Nitrofurantoin monohyd macro) .[name].. 1 tab by mouth two times a day for 7 days 21)  Zoloft 100 Mg Tabs (Sertraline hcl) .... [facility] 22)  Humulin R U-500 (concentrated) 500 Unit/ml Soln (Insulin regular human) .... 30 units with breafast, 20 units with lunch and 20 units with dinner for blood sugars and sliding scale at night  Other Orders: UA Dipstick w/o Micro (automated)  (81003)  Diabetes Management Assessment/Plan:      The following lipid goals have been established for the patient: Total cholesterol goal of 200; LDL cholesterol goal of 100; HDL cholesterol goal of 40; Triglyceride goal of 150.  Her blood pressure goal is < 140/90.    Hypertension Assessment/Plan:      The patient's hypertensive risk group is category C: Target organ damage and/or diabetes.  Her calculated 10 year risk of  coronary heart disease is 7 %.  Today's blood pressure is 117/74.  Her blood pressure goal is < 140/90.  Patient Instructions: 1)  Stop 70/30 and lantus 2)  Start Humulin Regular Concentrated  3)  30 units in morning 4)  20 units at lunch 5)  20 units with dinner 6)  at bedtime use sliding scale 7)  over 200 - 2 units 8)  201-250 - 4 units 9)  251-300 - 6 units 10)  301-350 - 8 units 11)  350 and over 8 units 12)  Follow up with n.martin,fnp in 2 weeks to see how new insulin is doing. Prescriptions: HUMULIN R U-500 (CONCENTRATED) 500 UNIT/ML SOLN (INSULIN REGULAR HUMAN) 30 units with breafast, 20 units with lunch and 20 units with dinner for blood sugars and sliding scale at night  #1 month qs x 11   Entered and Authorized by:   Lehman Prom FNP   Signed by:   Lehman Prom FNP on 11/17/2010   Method used:   Faxed to ...       [facility] (retail)       [address] [place], [place]  [address]       Ph: [phone]       Fax: [phone]   RxID:   [id number]    Orders Added: 1)  Capillary Blood Glucose/CBG [82948] 2)  Hgb A1C [83036QW] 3)  UA Dipstick w/o Micro (automated)  [81003] 4)  Est.  Patient Level III [99213]    Laboratory Results   Urine Tests  Date/Time Received: November 17, 2010 12:47 PM   Routine Urinalysis   Color: orange Glucose: 100   (Normal Range: Negative) Bilirubin: moderate   (Normal Range: Negative) Ketone: trace (5)   (Normal Range: Negative) Spec. Gravity: >=1.030   (Normal Range: 1.003-1.035) Blood: trace-intact   (Normal Range: Negative) pH: 5.0   (Normal Range: 5.0-8.0) Protein: 100   (Normal Range: Negative) Urobilinogen: 0.2   (Normal Range: 0-1) Nitrite: negative   (Normal Range: Negative) Leukocyte Esterace: trace   (Normal Range: Negative)     Blood Tests   Date/Time Received: November 17, 2010 12:41 PM   HGBA1C: 11.1%   (Normal Range: Non-Diabetic - 3-6%   Control  Diabetic - 6-8%) CBG Random:: 302mg /dL       Diabetic Foot Exam    10-g (5.07) Semmes-Weinstein Monofilament Test Performed by: Hale Drone CMA          Right Foot          Left Foot Visual Inspection     normal         normal Test Control      normal         normal Site 1         normal         normal Site 2         normal         normal Site 3         normal         normal Site 4         normal         normal Site 5         normal         normal Site 6         normal         normal Site 7         normal         normal Site 8         normal         normal Site 9         normal         abnormal Site 10         normal         normal  Impression      normal         normal

## 2010-11-29 LAB — BASIC METABOLIC PANEL
BUN: 20 mg/dL (ref 6–23)
CO2: 29 mEq/L (ref 19–32)
Calcium: 8.9 mg/dL (ref 8.4–10.5)
Calcium: 9 mg/dL (ref 8.4–10.5)
Creatinine, Ser: 1.24 mg/dL — ABNORMAL HIGH (ref 0.4–1.2)
Creatinine, Ser: 1.53 mg/dL — ABNORMAL HIGH (ref 0.4–1.2)
GFR calc Af Amer: 54 mL/min — ABNORMAL LOW (ref >60)
GFR calc non Af Amer: 35 mL/min — ABNORMAL LOW (ref >60)
GFR calc non Af Amer: 45 mL/min — ABNORMAL LOW (ref >60)
Glucose, Bld: 120 mg/dL — ABNORMAL HIGH (ref 70–99)
Glucose, Bld: 97 mg/dL (ref 70–99)

## 2010-11-29 LAB — GLUCOSE, CAPILLARY
Glucose-Capillary: 202 mg/dL — ABNORMAL HIGH (ref 70–99)
Glucose-Capillary: 321 mg/dL — ABNORMAL HIGH (ref 70–99)
Glucose-Capillary: 57 mg/dL — ABNORMAL LOW (ref 70–99)
Glucose-Capillary: 62 mg/dL — ABNORMAL LOW (ref 70–99)
Glucose-Capillary: 86 mg/dL (ref 70–99)
Glucose-Capillary: 99 mg/dL (ref 70–99)

## 2010-11-29 LAB — CBC
HCT: 37 % (ref 36.0–46.0)
Hemoglobin: 12.5 g/dL (ref 12.0–15.0)
Hemoglobin: 14.2 g/dL (ref 12.0–15.0)
MCHC: 33.6 g/dL (ref 30.0–36.0)
MCHC: 33.6 g/dL (ref 30.0–36.0)
MCHC: 34.2 g/dL (ref 30.0–36.0)
MCV: 93.2 fL (ref 78.0–100.0)
Platelets: 194 10*3/uL (ref 150–400)
RBC: 3.98 MIL/uL (ref 3.87–5.11)
RBC: 4.48 MIL/uL (ref 3.87–5.11)
RDW: 12.4 % (ref 11.5–15.5)
RDW: 12.8 % (ref 11.5–15.5)
WBC: 5.3 10*3/uL (ref 4.0–10.5)

## 2010-11-29 LAB — CARDIAC PANEL(CRET KIN+CKTOT+MB+TROPI)
CK, MB: 2.3 ng/mL (ref 0.3–4.0)
CK, MB: 2.5 ng/mL (ref 0.3–4.0)
Relative Index: 2.2 (ref 0.0–2.5)
Relative Index: INVALID (ref 0.0–2.5)
Troponin I: 0.02 ng/mL (ref 0.00–0.06)
Troponin I: 0.03 ng/mL (ref 0.00–0.06)
Troponin I: 0.03 ng/mL (ref 0.00–0.06)

## 2010-11-29 LAB — COMPREHENSIVE METABOLIC PANEL
ALT: 19 U/L (ref 0–35)
AST: 20 U/L (ref 0–37)
Albumin: 3.2 g/dL — ABNORMAL LOW (ref 3.5–5.2)
Calcium: 9.2 mg/dL (ref 8.4–10.5)
Creatinine, Ser: 1.42 mg/dL — ABNORMAL HIGH (ref 0.4–1.2)
GFR calc Af Amer: 46 mL/min — ABNORMAL LOW (ref >60)
Sodium: 136 mEq/L (ref 135–145)

## 2010-11-29 LAB — PROTIME-INR
INR: 1.07 (ref 0.00–1.49)
Prothrombin Time: 13.8 seconds (ref 11.6–15.2)

## 2010-11-29 LAB — HEPARIN LEVEL (UNFRACTIONATED)
Heparin Unfractionated: 0.16 IU/mL — ABNORMAL LOW (ref 0.30–0.70)
Heparin Unfractionated: 0.87 IU/mL — ABNORMAL HIGH (ref 0.30–0.70)

## 2010-11-29 LAB — APTT: aPTT: 29 seconds (ref 24–37)

## 2011-01-12 NOTE — Discharge Summary (Signed)
[name], [name]           ACCOUNT NO.:  [id number]   MEDICAL RECORD NO.:  [id number]          PATIENT TYPE:  INP   LOCATION:  4735                         FACILITY:  [facility]   PHYSICIAN:  Norton Blizzard, M.D.    DATE OF BIRTH:  1950-11-14   DATE OF ADMISSION:  08/02/2006  DATE OF DISCHARGE:  08/03/2006                               DISCHARGE SUMMARY   ADMIT TEAM:  Family Practice Teaching Service   DISCHARGE DIAGNOSES:  1. Asthma exacerbation.  2. Upper respiratory infection.  3. Urinary tract infection.  4. Hypertension.  5. Diabetes mellitus.  6. A history of black tarry stools.  7. Hypokalemia.  8. Increased creatinine.   PROCEDURES:  Chest x-ray showed no acute cardiopulmonary disease.   CONSULTS:  None.   DISCHARGE LABS:  Sodium 132, potassium 4.3, chloride 199, bicarb 23, BUN  13, creatinine 1.5, glucose 405, calcium 8.7, UA with moderate leukocyte  esterase, few squamous cells, nitrite negative, no protein or glucose,  hemoglobin Alc was pending, urine Gram-stain and urine culture pending.   HOSPITAL COURSE:  1. Asthma exacerbation.  Trigger believed to be a URI with patient's      congestion, cough that is productive.  She had good O2 saturations      throughout her hospital stay in the upper 90s to 100% on room air.      There was no infiltrate noted on chest x-ray.  The patient did not      require any supplemental oxygen throughout the admission.  She was      given one dose of Solu-Medrol IV and nebulizers of albuterol and      Atrovent in the emergency department and also at [facility] [place].  She was continued on prednisone 60 mg p.o. to      complete a 5-day course.  We sent the patient home with albuterol      MDI with a spacer after she was given albuterol and Atrovent      nebulizers q.4h. q.2 p.r.n. and per respiratory therapy.  We      continued her Advair but increased the dose to 250/50.  She was on      a continuous pulse  oximeter throughout her stay and was also in for      asthma teaching.  2. Urinary tract infection.  Moderate leukocyte esterase is noted on      urinalysis.  Her Gram-stain and culture were pending.  We decided      to treat her with Macrobid x3 days.  She also admitted to having      some stress incontinence, that when she coughs she leaks some      urine, and we believe this is secondary to a UTI, but this will      need followup as an outpatient to ensure resolution with treatment.  3. Hypertension.  The patient was initially 200/88 when she came in to      the hospital.  Her creatinine was noted to be 1.5, but we are      unsure what  her baseline is, and she did not have any mental status      changes or chest pain.  She was monitored closely overnight for any      changes that would point to her having hypotensive emergency, but      her blood pressure came down nicely and ranged from 150 to 152 over      66 to 81 after treatment of her asthma.  We continued her Diovan      and hydrochlorothiazide that was added as an inpatient.  Would      consider changing her to lisinopril and hydrochlorothiazide for      cost reasons and because she has not had any allergy to lisinopril.      Lisinopril/hydrochlorothiazide combo would be $4 at [facility] and      [employer].  4. Diabetes mellitus.  Patient's Alc is still pending on discharge.      She is now on steroids, so further CBG elevations would not be      indicative of how they normally have been running.  There was no      protein on urinalysis.  We started sulfonylurea, Glucotrol 0.5 mg      daily, and continued her home insulin while in the hospital.      Really it is possible that her bumping creatinine is due to      diabetes maybe long term, but this will need followup.  5. A history of black tarry stools.  Her hemoglobin was normal on      admission at 14.3.  She was not complaining of any black tarry      stools within the past 4  weeks.  She has had a colonoscopy in 2003      which did not show any polyps or any other reason for her to have      bloody stools.  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  This was      done by Dr. Loreta Ave and patient will need followup with her or him.      She was hemoccult negative.  6. Hypokalemia.  Patient's potassium on admission was 3.2 and felt to      be due to shifting less albuterol treatment.  We did replete her      with 40 mEq of K-Dur.  7. Increased creatinine.  Consider doing a FENa, but we do not know      her baseline creatinine.  I suspect that she has some level of      chronic renal insufficiency secondary to her diabetes mellitus, but      this will need to be followed up as an outpatient.   DISCHARGE MEDICATIONS AND INSTRUCTIONS:  1. Advair 250/50 one puff b.i.d.  2. Glucotrol 5 mg daily.  3. Albuterol MDI one to two puffs q.4h. p.r.n. dyspnea.  4. Prednisone 60 mg daily x4 more days.  5. Diovan/HCTZ 160/25 one tab daily.  6. Macrobid 100 mg p.o. b.i.d. x3 days.  7. Tylenol with codeine 5-10 mL q.6h. p.r.n. cough.  8. Mucinex 1200 mg b.i.d. p.r.n. thick mucus.  9. Insulin 70/30 as taken at home.   Patient to return for fevers, worsening shortness of breath, or other  concerns.   FOLLOWUP:  The patient will followup at [facility] and to call  for an appointment sometime in  the week following her hospital stay.  Patient also to set up a follow-up appointment with Dr. Loreta Ave for her  history of black tarry stools.   DISCHARGE CONDITION:  Good, stable.   ISSUES FOR FOLLOWUP:  1. Ensure resolution of asthma.  2. Followup stress incontinence after treatment with UTI.  3. Consider changing to lisinopril/hydrochlorothiazide combination for      cost purposes.  4. Assess the need for patient to be on sulfonylurea long term as she      may have some benefit with metformin     instead of  insulin.  5. Follow up with Dr. Loreta Ave for history of black tarry stools.  6. Note that her creatinine was 1.5 and whether she needs further      workup for this.           ______________________________  Norton Blizzard, M.D.     SH/MEDQ  D:  08/04/2006  T:  08/04/2006  Job:  [id number]   cc:   [facility]  Anselmo Rod, M.D.

## 2011-01-12 NOTE — Op Note (Signed)
   NAME:  [name], [name]                     ACCOUNT NO.:  [id number]   MEDICAL RECORD NO.:  [id number]                   PATIENT TYPE:  AMB   LOCATION:  ENDO                                 FACILITY:  [facility]   PHYSICIAN:  Anselmo Rod, M.D.               DATE OF BIRTH:  [birth year]   DATE OF PROCEDURE:  06/18/2002  DATE OF DISCHARGE:                                 OPERATIVE REPORT   PROCEDURE PERFORMED:  Colonoscopy.   ENDOSCOPIST:  Charna Elizabeth, M.D.   INSTRUMENT USED:  Olympus video colonoscope.   INDICATIONS FOR PROCEDURE:  The patient is a 61 year old female with a  history of rectal bleeding family history of breast cancer in a maternal  aunt.  Rule out colonic polyps, masses, hemorrhoids, etc.   PREPROCEDURE PREPARATION:  Informed consent was procured from the patient.  The patient was fasted for eight hours prior to the procedure and prepped  with a bottle of magnesium citrate and a gallon of NuLytely the night prior  to the procedure.   PREPROCEDURE PHYSICAL:  The patient had stable vital signs. Neck supple.  Chest clear to auscultation.  S1 and S2 regular.  Abdomen soft with normal  bowel sounds.   DESCRIPTION OF PROCEDURE:  The patient was placed in left lateral decubitus  position and sedated with an additional 30 mg of Demerol and 2 mg of Versed  intravenously.  Once the patient was adequately sedated and maintained on  low flow oxygen and continuous cardiac monitoring, the Olympus video  colonoscope was advanced from the rectum to the cecum and terminal ileum  with difficulty.  There was a large amount of residual stool in the colon.  Multiple washes were done, no masses, polyps, erosions, ulcerations or  diverticula were seen.   IMPRESSION:  1. Essentially normal colonoscopy up to the terminal ileum.  2. Some residual stool in the colon, small lesions could have been missed.  3. No large masses or polyps seen.   RECOMMENDATIONS:  Outpatient follow-up is  advised for further evaluation.  If the patient continues to have rectal bleeding, a small bowel study or  repeat colonoscopy may be required in the future months.                                                  Anselmo Rod, M.D.   JNM/MEDQ  D:  06/18/2002  T:  06/19/2002  Job:  [id number]   cc:   Talmadge Coventry, M.D.

## 2011-01-12 NOTE — H&P (Signed)
[name], [name]           ACCOUNT NO.:  [id number]   MEDICAL RECORD NO.:  [id number]          PATIENT TYPE:  INP   LOCATION:  4735                         FACILITY:  [facility]   PHYSICIAN:  Norton Blizzard, M.D.    DATE OF BIRTH:  [birth year]   DATE OF ADMISSION:  08/02/2006  DATE OF DISCHARGE:                              HISTORY & PHYSICAL   CHIEF COMPLAINT:  Shortness of breath.   HISTORY OF PRESENT ILLNESS:  This is a 60 year old female with a history  of asthma who presented to urgent medical care in [place] today with a  two-week history of increasing shortness of breath, worsening cough, and  wheezing over that time.  She does not have a nebulizer machine or  inhalers at home though she had been prescribed these.  She has been  more congested but denies sick contacts or tuberculosis contacts.  The  only things she tried are Mucinex, Robitussin, and Alk- Seltzer Cold  which have not helped.  She has felt hot but has not taken her  temperature.  Her cough has been productive of yellow sputum with  occasional red streaks.  The patient has chest tightness when she coughs  but no chest pain on its own aside from when she has a cough.   For the patient's asthma, she states dust, cold, and changes in  temperature trigger her asthma.  She has not had a flu shot this year.  She has never been intubated for asthma.  She has not required steroids  or needed to be hospitalized for several years.   Today, she was given a breathing treatment with albuterol and Atrovent  and 125 mg Solu-Medrol IV which has helped her a lot.  She was given  Tylenol with codeine for her cough and another nebulizer of Xopenex and  albuterol in our emergency department before admission.  She has had  muscle aches as well.   PAST MEDICAL HISTORY:  1. Asthma.  2. Hypertension.  3. Diabetes mellitus type 2.   MEDICATIONS:  1. Diovan HCT 160/25 one pill daily.  2. Advair b.i.d. dose is unknown.  3.  Humulin 70/30 twenty-four units in the morning and 20 units at      nighttime.   ALLERGIES:  No known drug allergies.   PAST SURGICAL HISTORY:  1. Knee surgery.  2. The patient has had EGD which showed an esophageal stricture about      six to seven months ago that was dilated by Dr. Loreta Ave.  The patient      was told that she will probably need to return in about six months      from that study for a repeat dilatation.   SOCIAL HISTORY:  The patient works in a Teaching laboratory technician.  She  currently lives with her female partner.  She denied alcohol, smoking,  or other drug use.  There is also one dog in the house.   FAMILY HISTORY:  The patient's nephew has asthma.  Her father died from  respiratory heart problems, but she could not elaborate as to what  specific respiratory heart problems  he had.  Mother is deceased at age  46 from lung cancer, though she also had a bad heart with  angioplasties.   REVIEW OF SYSTEMS:  GENERAL:  Occasional chills, sweating, but not  soaking the sheets, and felt hot.  HEENT:  Positive for congestion but  no sore throat.  CARDIOVASCULAR:  No edema.  Positive for chest  tightness when she has a cough and no palpitations, no paroxysmal  nocturnal dyspnea and no orthopnea.  LUNGS:  See HPI.  GASTROINTESTINAL:  Positive for occasional dark tarry stools though her last one was three  to four weeks ago.  No bright red blood per rectum.  Positive for  diarrhea off and on, no constipation and no vomiting.  GENITOURINARY:  Positive for dysuria, positive for loss of urine with cough.  MUSCULOSKELETAL:  Positive for diffuse muscle soreness.   PHYSICAL EXAMINATION:  VITAL SIGNS:  Temperature 96.8, pulse 82, blood  pressure 200/88, respirations 20, pulse oximetry 100% on room air about  one hour after nebulizations.  GENERAL:  No acute distress, no increased work of breathing one hour  after nebulizations.  HEENT:  Extraocular movements intact.  Pupils  equal, round, and reactive  to light.  Tympanic membranes clear and glossy.  Pharynx is normal.  NECK:  No lymphadenopathy, no thyromegaly, no JVD.  CARDIOVASCULAR:  Regular rate and rhythm, no rubs or gallops.  She has a  2 out of 6 ejection murmur at the right upper sternal border.  LUNGS:  Decreased breath sounds bilaterally with end-expiratory wheezes  especially in the bases.  No rales or rhonchi, and no increased work of  breathing.  ABDOMEN:  Soft, nontender, nondistended, no hepatosplenomegaly.  EXTREMITIES:  No edema.  2+ PT pulses.   LABORATORY DATA AND STUDIES:  Sodium 136, potassium 3.2, chloride 103,  bicarbonate 23, BUN 10, creatinine 1.4, glucose 164, hemoglobin 14.3,  hematocrit 42.  Chest x-ray:  No acute infiltrate or abnormality.  It is  not hyperinflated.   ASSESSMENT AND PLAN:  A 60 year old female with:  1. Asthma exacerbation.  Triggers likely urinary tract infection      versus possible flu with muscle aches though this is less likely.      She is not working, hard to breath currently, and in saturating      100% on room air after a treatment.  The chest x-ray is without an      infiltrate and not hyperinflated as one would expect with pneumonia      or chronic obstructive pulmonary disease, respectively.  She has      had a slow progression and congestion consistent with urinary tract      infection and wheezing in her lungs that is consistent with asthma.      We will titrate her O2 to keep saturations greater than or equal to      90%.  Will order asthma teaching.  She will get nebulizers      q.4h./q.2h. p.r.n. and per Respiratory Therapy.  Will continue her      Advair, will do a dose of 250/50.  Will also start prednisone 60 mg      p.o. x5 days .  She will have continuous pulse oximetry.  2. Dysuria.  Will check a urinalysis.  The patient also feels like she      has stress though this has only been since she had these symptoms     of a urinary tract  infection and  asthma exacerbation.  It could be      possible that she is just coughing more, and that is what is bring      this about and making it more common. We will assess it for an      infection as stated above with a urinalysis and then consider      treating for stress incontinence.  3. Hypertension, markedly elevated.  Will start with her home Diovan      and monitor it closely.  Now that respiratory status is improving,      hope that her blood pressure will come down as she becomes less      stressed.  Will monitor her for a hypertensive emergency/urgency.      She has no mental status changes and no chest pain currently.  We      are checking a urinalysis for casts to see if she has any evidence      of acute tubular necrosis.  Creatinine is slightly increased, but      we do not know her baseline.  Will consider a labetalol or      hydralazine in the short-term if she continues to be high and needs      to be brought down acutely.  4. Diabetes mellitus.  Check an A1c, start home Humulin 70/30 dose and      start her on a sliding-scale insulin above that.  This also may be      the reason why her creatinine is increased, but will assess for      protein and casts on the urinalysis.  5. History of black tarry stools.  Her hemoglobin is stable.  Will      heme check her, but likely she needs outpatient referral back to      her GI doctor, Dr. Loreta Ave.  She had a colonoscopy done in 2003, and I      will check on these results.  6. Prophylaxis.  Ambulate ad lib.           ______________________________  Norton Blizzard, M.D.     SH/MEDQ  D:  08/02/2006  T:  08/03/2006  Job:  [id number]

## 2011-01-12 NOTE — Op Note (Signed)
   NAME:  [name], [name]                     ACCOUNT NO.:  [id number]   MEDICAL RECORD NO.:  [id number]                   PATIENT TYPE:  AMB   LOCATION:  ENDO                                 FACILITY:  [facility]   PHYSICIAN:  Anselmo Rod, M.D.               DATE OF BIRTH:  11/25/1950   DATE OF PROCEDURE:  DATE OF DISCHARGE:  06/18/2002                                 OPERATIVE REPORT   PROCEDURE PERFORMED:  Esophagogastroduodenoscopy.   ENDOSCOPIST:  Anselmo Rod, M.D.   INSTRUMENT USED:  Olympus video panendoscope.   INDICATIONS FOR PROCEDURE:  Blood in stool in a 60 year old female.  Rule  out peptic ulcer disease, esophagitis, gastritis, etc.   PREPROCEDURE PREPARATION:  Informed consent was procured from the patient.  The patient fasted for eight hours prior to the procedure.   PREPROCEDURE PHYSICAL:  VITAL SIGNS:  The patient had stable vital signs.  NECK:  Supple.  CHEST:  Clear to auscultation.  S1 and S2 regular.  ABDOMEN:  Soft with normal bowel sounds.   DESCRIPTION OF PROCEDURE:  The patient was placed in the left lateral  decubitus position and sedated with 70 mg of Demerol and 6 mg of Versed  intravenously.  Once the patient was adequately sedated and maintained on  low flow oxygen and continuous cardiac monitoring, the Olympus video  panendoscope was advanced through the mouth piece over the tongue into  esophagus under direct vision.  The entire esophagus appeared normal with no  evidence of ring, stricture, masses, esophagitis or Barrett's mucosa.  The  scope was then advanced to the stomach.  The entire gastric mucosa and the  proximal small bowel appeared normal.   IMPRESSION:  Normal esophagogastroduodenoscopy except for a small hiatal  hernia.   RECOMMENDATIONS:  Proceed with a colonoscopy at this time.                                                 Anselmo Rod, M.D.    JNM/MEDQ  D:  06/18/2002  T:  06/19/2002  Job:  [id number]   cc:    Talmadge Coventry, M.D.

## 2011-02-20 ENCOUNTER — Other Ambulatory Visit: Payer: Self-pay | Admitting: Internal Medicine

## 2011-02-21 ENCOUNTER — Other Ambulatory Visit (HOSPITAL_COMMUNITY): Payer: Self-pay | Admitting: Family Medicine

## 2011-02-21 DIAGNOSIS — N839 Noninflammatory disorder of ovary, fallopian tube and broad ligament, unspecified: Secondary | ICD-10-CM

## 2011-02-21 DIAGNOSIS — R102 Pelvic and perineal pain: Secondary | ICD-10-CM

## 2011-02-26 ENCOUNTER — Ambulatory Visit (HOSPITAL_COMMUNITY)
Admission: RE | Admit: 2011-02-26 | Discharge: 2011-02-26 | Disposition: A | Discharge status: Home / Self Care | Payer: Self-pay | Source: Ambulatory Visit | Attending: Family Medicine | Admitting: Family Medicine

## 2011-02-26 DIAGNOSIS — N839 Noninflammatory disorder of ovary, fallopian tube and broad ligament, unspecified: Secondary | ICD-10-CM

## 2011-02-26 DIAGNOSIS — R102 Pelvic and perineal pain: Secondary | ICD-10-CM

## 2011-02-26 DIAGNOSIS — N949 Unspecified condition associated with female genital organs and menstrual cycle: Secondary | ICD-10-CM | POA: Insufficient documentation

## 2011-02-26 DIAGNOSIS — Z780 Asymptomatic menopausal state: Secondary | ICD-10-CM | POA: Insufficient documentation

## 2011-03-15 ENCOUNTER — Ambulatory Visit (HOSPITAL_BASED_OUTPATIENT_CLINIC_OR_DEPARTMENT_OTHER): Payer: Self-pay | Attending: Internal Medicine

## 2011-03-15 DIAGNOSIS — G4700 Insomnia, unspecified: Secondary | ICD-10-CM | POA: Insufficient documentation

## 2011-03-17 DIAGNOSIS — G4700 Insomnia, unspecified: Secondary | ICD-10-CM

## 2011-03-17 DIAGNOSIS — G4730 Sleep apnea, unspecified: Secondary | ICD-10-CM

## 2011-03-17 NOTE — Procedures (Signed)
[name], [name]           ACCOUNT NO.:  [id number]  MEDICAL RECORD NO.:  [id number]          PATIENT TYPE:  OUT  LOCATION:  SLEEP CENTER                 FACILITY:  [facility]  PHYSICIAN:  Clinton D. Maple Hudson, MD, FCCP, FACPDATE OF BIRTH:  [birth year]  DATE OF STUDY:  03/15/2011                           NOCTURNAL POLYSOMNOGRAM  REFERRING PHYSICIAN:  Tresa Endo L. Philipp Deputy, M.D.  INDICATION FOR STUDY:  Insomnia with sleep apnea.  EPWORTH SLEEPINESS SCORE:  7/24.  BMI 31.9.  Weight 186 pounds, height 64 inches.  Neck 13.5 inches.  HOME MEDICATIONS:  Charted and reviewed.  SLEEP ARCHITECTURE:  Total sleep time 134.5 minutes with sleep efficiency 33.9%.  Stage I was 12.6%, stage II 87.4.  Stages III and REM were absent.  Sleep latency 112.5 minutes.  Awake after sleep onset 149.5 minutes, arousal index 43.3.  BEDTIME MEDICATION:  None.  RESPIRATORY DATA:  Apnea-hypopnea index (AHI) zero per hour.  No respiratory events were scored.  OXYGEN DATA:  Moderate snoring with oxygen desaturation to a nadir of 94% and a mean oxygen saturation through the study of 96.6% on room air.  CARDIAC DATA:  Normal sinus rhythm.  MOVEMENT-PARASOMNIA:  No scored movement disturbance.  Bathroom x1. During video observation, the patient was generally awake and restless through much of the night.  IMPRESSION-RECOMMENDATIONS: 1. Sleep was marked by long intervals of nonspecific wakefulness with     final sustained sleep only after 3:30 a.m.  No bedtime medication     was taken. 2. No significant respiratory events with sleep disturbance, apnea-     hypopnea index zero per hour (normal range 0-     5 per hour).  Moderate snoring with oxygen desaturation to a nadir     of 94% and a mean oxygen saturation through the study of 96.6% on     room air.     Clinton D. Maple Hudson, MD, [facility], FACP Diplomate, Biomedical engineer of Sleep Medicine Electronically Signed    CDY/MEDQ  D:  03/17/2011 14:05:13  T:   03/17/2011 22:54:05  Job:  [id number]

## 2011-08-23 ENCOUNTER — Other Ambulatory Visit: Payer: Self-pay | Admitting: Gastroenterology

## 2011-09-19 ENCOUNTER — Emergency Department (HOSPITAL_COMMUNITY)
Admission: EM | Admit: 2011-09-19 | Discharge: 2011-09-19 | Disposition: A | Discharge status: Home / Self Care | Payer: Medicaid Other | Attending: Emergency Medicine | Admitting: Emergency Medicine

## 2011-09-19 ENCOUNTER — Emergency Department (HOSPITAL_COMMUNITY): Payer: Medicaid Other

## 2011-09-19 ENCOUNTER — Encounter (HOSPITAL_COMMUNITY): Payer: Self-pay

## 2011-09-19 ENCOUNTER — Other Ambulatory Visit: Payer: Self-pay

## 2011-09-19 DIAGNOSIS — I10 Essential (primary) hypertension: Secondary | ICD-10-CM | POA: Insufficient documentation

## 2011-09-19 DIAGNOSIS — E119 Type 2 diabetes mellitus without complications: Secondary | ICD-10-CM | POA: Insufficient documentation

## 2011-09-19 DIAGNOSIS — R1013 Epigastric pain: Secondary | ICD-10-CM | POA: Insufficient documentation

## 2011-09-19 DIAGNOSIS — R110 Nausea: Secondary | ICD-10-CM | POA: Insufficient documentation

## 2011-09-19 DIAGNOSIS — R42 Dizziness and giddiness: Secondary | ICD-10-CM | POA: Insufficient documentation

## 2011-09-19 DIAGNOSIS — R079 Chest pain, unspecified: Secondary | ICD-10-CM | POA: Insufficient documentation

## 2011-09-19 DIAGNOSIS — R61 Generalized hyperhidrosis: Secondary | ICD-10-CM | POA: Insufficient documentation

## 2011-09-19 DIAGNOSIS — R0602 Shortness of breath: Secondary | ICD-10-CM | POA: Insufficient documentation

## 2011-09-19 HISTORY — DX: Essential (primary) hypertension: I10

## 2011-09-19 LAB — CBC
HCT: 44 % (ref 36.0–46.0)
MCH: 30.3 pg (ref 26.0–34.0)
MCHC: 33.9 g/dL (ref 30.0–36.0)
RDW: 12.3 % (ref 11.5–15.5)

## 2011-09-19 LAB — BASIC METABOLIC PANEL
BUN: 10 mg/dL (ref 6–23)
Calcium: 10 mg/dL (ref 8.4–10.5)
Creatinine, Ser: 1.36 mg/dL — ABNORMAL HIGH (ref 0.50–1.10)
GFR calc Af Amer: 48 mL/min — ABNORMAL LOW (ref >90)
GFR calc non Af Amer: 42 mL/min — ABNORMAL LOW (ref >90)

## 2011-09-19 LAB — DIFFERENTIAL
Basophils Absolute: 0 10*3/uL (ref 0.0–0.1)
Basophils Relative: 0 % (ref 0–1)
Eosinophils Absolute: 0.1 10*3/uL (ref 0.0–0.7)
Eosinophils Relative: 1 % (ref 0–5)
Monocytes Absolute: 0.6 10*3/uL (ref 0.1–1.0)
Monocytes Relative: 6 % (ref 3–12)
Neutro Abs: 5 10*3/uL (ref 1.7–7.7)

## 2011-09-19 LAB — TROPONIN I: Troponin I: <0.3 ng/mL (ref <0.30)

## 2011-09-19 MED ORDER — SODIUM CHLORIDE 0.9 % IV BOLUS (SEPSIS)
1000.0000 mL | Freq: Once | INTRAVENOUS | Status: AC
  Administered 2011-09-19: 1000 mL via INTRAVENOUS

## 2011-09-19 MED ORDER — ACETAMINOPHEN 325 MG PO TABS
650.0000 mg | ORAL_TABLET | Freq: Once | ORAL | Status: AC
  Administered 2011-09-19: 650 mg via ORAL
  Filled 2011-09-19: qty 2

## 2011-09-19 MED ORDER — POTASSIUM CHLORIDE CRYS ER 20 MEQ PO TBCR
40.0000 meq | EXTENDED_RELEASE_TABLET | Freq: Once | ORAL | Status: AC
  Administered 2011-09-19: 40 meq via ORAL
  Filled 2011-09-19: qty 2

## 2011-09-19 MED ORDER — MORPHINE SULFATE 4 MG/ML IJ SOLN
4.0000 mg | Freq: Once | INTRAMUSCULAR | Status: AC
  Administered 2011-09-19: 4 mg via INTRAVENOUS
  Filled 2011-09-19: qty 1

## 2011-09-19 MED ORDER — ALBUTEROL SULFATE HFA 108 (90 BASE) MCG/ACT IN AERS
6.0000 | INHALATION_SPRAY | Freq: Once | RESPIRATORY_TRACT | Status: AC
  Administered 2011-09-19: 6 via RESPIRATORY_TRACT
  Filled 2011-09-19: qty 6.7

## 2011-09-19 MED ORDER — AEROCHAMBER PLUS W/MASK MISC
Status: AC
  Administered 2011-09-19: 20:00:00
  Filled 2011-09-19: qty 1

## 2011-09-19 NOTE — ED Provider Notes (Signed)
BP 138/80  Pulse 79  Temp(Src) 98.6 F (37 C) (Oral)  Resp 20  Ht 5\' 4"  (1.626 m)  Wt 190 lb (86.183 kg)  BMI 32.61 kg/m2  SpO2 98% Pt seen with resident Here for chest pain, though it is very reproducible Apparently was at a procedure for her family, had leg cramps stood up and had syncopal event H/o negative cardiac cath with normal EF in 2010 Had hypotension though after NTG   Joya Gaskins, MD 09/19/11 2021

## 2011-09-19 NOTE — ED Notes (Signed)
RES at bedside aware of BP

## 2011-09-19 NOTE — ED Notes (Signed)
Patient was given verbal and written d/c instructions.  Left amb with family/siginigicant other; steady gait. Patient is to follow up with MD

## 2011-09-19 NOTE — ED Notes (Signed)
MD at bedside for evaluation

## 2011-09-19 NOTE — ED Notes (Signed)
PT complaining of bilateral ear pain 6/10.

## 2011-09-19 NOTE — ED Notes (Signed)
Pt states she is having severe cramping in her legs. RES is aware.

## 2011-09-19 NOTE — ED Provider Notes (Signed)
History     CSN: [id number]  Arrival date & time 09/19/11  1747   First MD Initiated Contact with Patient 09/19/11 1805      Chief Complaint  Patient presents with  . Chest Pain    (Consider location/radiation/quality/duration/timing/severity/associated sxs/prior treatment) Patient is a 61 y.o. female presenting with chest pain. The history is provided by the patient. The history is limited by the condition of the patient.  Chest Pain The chest pain began less than 1 hour ago. Chest pain occurs constantly. The chest pain is improving. The pain is associated with eating. At its most intense, the pain is at 8/10. The pain is currently at 3/10. The severity of the pain is moderate. The quality of the pain is described as pressure-like (located substernal and lower right chest). The pain does not radiate. Primary symptoms include shortness of breath, nausea and dizziness. Pertinent negatives for primary symptoms include no fever, no cough, no abdominal pain and no vomiting. Primary symptoms comment: lower extremity cramping.  Dizziness also occurs with nausea and diaphoresis. Dizziness does not occur with vomiting.  Associated symptoms include diaphoresis. She tried nitroglycerin (with improvement in pain) for the symptoms.     Past Medical History  Diagnosis Date  . Diabetes mellitus   . Hypertension     Past Surgical History  Procedure Date  . Cholecystectomy   . Colon surgery   . Esophagus dilated   . Arthroscopic knee surgery     Family History  Problem Relation Age of Onset  . Diabetes Mother   . Hypertension Mother   . Cancer Mother   . Diabetes Father   . Hypertension Father   . Diabetes Brother     History  Substance Use Topics  . Smoking status: Never Smoker   . Smokeless tobacco: Not on file  . Alcohol Use: No    OB History    Grav Para Term Preterm Abortions TAB SAB Ect Mult Living                  Review of Systems  Constitutional: Positive for  diaphoresis. Negative for fever.  HENT: Negative for congestion.   Respiratory: Positive for shortness of breath. Negative for cough.   Cardiovascular: Positive for chest pain.  Gastrointestinal: Positive for nausea. Negative for vomiting, abdominal pain and diarrhea.  Genitourinary: Negative for difficulty urinating.  Neurological: Positive for dizziness.  All other systems reviewed and are negative.    Allergies  Review of patient's allergies indicates no known allergies.  Home Medications  No current outpatient prescriptions on file.  BP 72/49  Pulse 84  Temp(Src) 98.6 F (37 C) (Oral)  Resp 19  Ht 5\' 4"  (1.626 m)  Wt 190 lb (86.183 kg)  BMI 32.61 kg/m2  SpO2 98%  Physical Exam  Nursing note and vitals reviewed. Constitutional: She is oriented to person, place, and time. She appears well-developed and well-nourished. She appears distressed (appears in pain, gripping side rails. ).  HENT:  Head: Normocephalic and atraumatic.  Right Ear: External ear normal.  Left Ear: External ear normal.  Mouth/Throat: Oropharynx is clear and moist.  Eyes: Conjunctivae are normal. Pupils are equal, round, and reactive to light. No scleral icterus.  Neck: Neck supple.  Cardiovascular: Normal rate, regular rhythm, normal heart sounds and intact distal pulses.   No murmur heard. Pulmonary/Chest: Effort normal. No stridor. No respiratory distress. She has wheezes. She has no rales.       Chest wall tender to  palpation of right chest wall.  Abdominal: Soft. Bowel sounds are normal. She exhibits no distension. There is tenderness (mild epigastric tenderness).  Musculoskeletal: Normal range of motion.       2+ DP pulses bilaterall  Neurological: She is alert and oriented to person, place, and time.  Skin: Skin is warm and dry. No rash noted.  Psychiatric: She has a normal mood and affect. Her behavior is normal.    ED Course  Procedures (including critical care time)  Labs Reviewed    BASIC METABOLIC PANEL - Abnormal; Notable for the following:    Potassium 2.9 (*)    Glucose, Bld 136 (*)    Creatinine, Ser 1.36 (*)    GFR calc non Af Amer 42 (*)    GFR calc Af Amer 48 (*)    All other components within normal limits  CBC  DIFFERENTIAL  TROPONIN I  TROPONIN I   Dg Chest Portable 1 View  09/19/2011  *RADIOLOGY REPORT*  Clinical Data: Shortness of breath.  PORTABLE CHEST - 1 VIEW  Comparison: Chest 07/19/2009.  Findings: Lungs are clear.  No pneumothorax or effusion.  Heart size normal.  No focal bony abnormality.  IMPRESSION: Negative chest.  Original Report Authenticated By: Bernadene Bell. D'ALESSIO, M.D.  . All radiology studies independently viewed by me.      EKG - sinus tachycardia, rate 122, normal axis, normal intervals, no St elevations/depressions, T wave inversion in lateral leads consistent with prior.    EKG - NSR, rate 77, no ST elevations or depressions.     1. Chest pain       MDM  61 yo female with right sided atypical chest pain which began while she was sitting in a waiting room.  She has a history of similar pain "for a while now".  Pain is worse with coughing, eating, twisting, and moving her right arm.  Made better with her inhalers.  On arrival, also complained of severe lower extremity cramping, which she stated was worse than her chest pain.  She was writhing in bed.  She was initially hypotensive on arrival, which is thought to be secondary to nitro administered by EMS.  She was AO at this time.  Her BPs improved with fluids and time.  ASA also administered by EMS.    On review of patient's chart, she had a cath with normal coronaries 2 years ago.  Given the atypical nature of her pain, ACS felt to be less likely.  Initial EKG showed sinus tach without signs of ischemia.  Repeat EKG was NSR.  CXR was unremarkable. Two sets of troponins were negative.    PE also felt to be very low probability based on history.  Clinical picture more consistent  with MSK chest pain.  Her potassium was low, which is likely explanation for LE cramping (which improved throughout ED course.)  She was given oral replacement and advised to increase K in diet.  Labwork also showed mild elevation in creatinine. IV fluids were given.  She will follow up with her PCP for a recheck.  Her wheezing resolved after albuterol inhaler.    Pt stable at time of discharge.  Advised PCP follow up tomorrow for recheck.  DC'd home.       Warnell Forester, MD 09/20/11 [phone]

## 2011-09-19 NOTE — ED Notes (Signed)
Patient brought in by EMS for substernal chest pain sudden onset while working- history of recurrent chest pain with recent endoscopy and bronchoscopy to determine cause.  20 g saline lock in left wrist-EKG enroute NSR- one nitro given with slight relief.

## 2011-09-20 NOTE — ED Provider Notes (Signed)
I have personally seen and examined the patient.  I have discussed the plan of care with the resident.  I have reviewed the documentation on PMH/FH/Soc. History.  I have reviewed the documentation of the resident and agree.  I have reviewed and agree with the ECG interpretation(s) documented by the resident.  Pt observed for several hrs in ED.  She had reproducible CP while in the ED, and although initially hypotensive, this responded appropriately and felt due to meds.  Suspicion for ACS/PE/Dissection is low given exam and clinical course.    Joya Gaskins, MD 09/20/11 [phone]

## 2011-10-17 ENCOUNTER — Other Ambulatory Visit: Payer: Self-pay | Admitting: Family Medicine

## 2011-10-17 DIAGNOSIS — Z1231 Encounter for screening mammogram for malignant neoplasm of breast: Secondary | ICD-10-CM

## 2011-11-01 ENCOUNTER — Ambulatory Visit
Admission: RE | Admit: 2011-11-01 | Discharge: 2011-11-01 | Disposition: A | Discharge status: Home / Self Care | Payer: Medicaid Other | Source: Ambulatory Visit | Attending: Family Medicine | Admitting: Family Medicine

## 2011-11-01 DIAGNOSIS — Z1231 Encounter for screening mammogram for malignant neoplasm of breast: Secondary | ICD-10-CM

## 2011-12-18 ENCOUNTER — Encounter (HOSPITAL_COMMUNITY): Payer: Self-pay | Admitting: Emergency Medicine

## 2011-12-18 ENCOUNTER — Emergency Department (HOSPITAL_COMMUNITY): Payer: No Typology Code available for payment source

## 2011-12-18 ENCOUNTER — Emergency Department (HOSPITAL_COMMUNITY)
Admission: EM | Admit: 2011-12-18 | Discharge: 2011-12-18 | Disposition: A | Discharge status: Home / Self Care | Payer: No Typology Code available for payment source | Attending: Emergency Medicine | Admitting: Emergency Medicine

## 2011-12-18 DIAGNOSIS — M545 Low back pain, unspecified: Secondary | ICD-10-CM | POA: Insufficient documentation

## 2011-12-18 DIAGNOSIS — I10 Essential (primary) hypertension: Secondary | ICD-10-CM | POA: Insufficient documentation

## 2011-12-18 DIAGNOSIS — Z794 Long term (current) use of insulin: Secondary | ICD-10-CM | POA: Insufficient documentation

## 2011-12-18 DIAGNOSIS — Z79899 Other long term (current) drug therapy: Secondary | ICD-10-CM | POA: Insufficient documentation

## 2011-12-18 DIAGNOSIS — M25539 Pain in unspecified wrist: Secondary | ICD-10-CM | POA: Insufficient documentation

## 2011-12-18 DIAGNOSIS — E119 Type 2 diabetes mellitus without complications: Secondary | ICD-10-CM | POA: Insufficient documentation

## 2011-12-18 DIAGNOSIS — R079 Chest pain, unspecified: Secondary | ICD-10-CM | POA: Insufficient documentation

## 2011-12-18 DIAGNOSIS — Z7982 Long term (current) use of aspirin: Secondary | ICD-10-CM | POA: Insufficient documentation

## 2011-12-18 DIAGNOSIS — R209 Unspecified disturbances of skin sensation: Secondary | ICD-10-CM | POA: Insufficient documentation

## 2011-12-18 MED ORDER — CYCLOBENZAPRINE HCL 10 MG PO TABS: 5.0000 mg | ORAL_TABLET | Freq: Two times a day (BID) | ORAL | Status: AC | PRN

## 2011-12-18 MED ORDER — TRAMADOL HCL 50 MG PO TABS: 50.0000 mg | ORAL_TABLET | Freq: Four times a day (QID) | ORAL | Status: AC | PRN
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
## 2011-12-18 NOTE — ED Provider Notes (Signed)
Medical screening examination/treatment/procedure(s) were performed by non-physician practitioner and as supervising physician I was immediately available for consultation/collaboration.   Takeesha Isley, MD 12/18/11 2324 

## 2012-06-12 ENCOUNTER — Emergency Department (HOSPITAL_COMMUNITY): Payer: PRIVATE HEALTH INSURANCE

## 2012-06-12 ENCOUNTER — Encounter (HOSPITAL_COMMUNITY): Payer: Self-pay | Admitting: Family Medicine

## 2012-06-12 ENCOUNTER — Observation Stay (HOSPITAL_COMMUNITY)
Admission: EM | Admit: 2012-06-12 | Discharge: 2012-06-12 | Disposition: A | Discharge status: Home / Self Care | Payer: PRIVATE HEALTH INSURANCE | Attending: Emergency Medicine | Admitting: Emergency Medicine

## 2012-06-12 DIAGNOSIS — J189 Pneumonia, unspecified organism: Secondary | ICD-10-CM

## 2012-06-12 DIAGNOSIS — E119 Type 2 diabetes mellitus without complications: Secondary | ICD-10-CM | POA: Insufficient documentation

## 2012-06-12 DIAGNOSIS — R0602 Shortness of breath: Secondary | ICD-10-CM | POA: Insufficient documentation

## 2012-06-12 DIAGNOSIS — R059 Cough, unspecified: Principal | ICD-10-CM | POA: Insufficient documentation

## 2012-06-12 DIAGNOSIS — I10 Essential (primary) hypertension: Secondary | ICD-10-CM | POA: Insufficient documentation

## 2012-06-12 DIAGNOSIS — J45901 Unspecified asthma with (acute) exacerbation: Secondary | ICD-10-CM

## 2012-06-12 DIAGNOSIS — R062 Wheezing: Secondary | ICD-10-CM | POA: Insufficient documentation

## 2012-06-12 DIAGNOSIS — R05 Cough: Secondary | ICD-10-CM | POA: Insufficient documentation

## 2012-06-12 HISTORY — DX: Unspecified osteoarthritis, unspecified site: M19.90

## 2012-06-12 LAB — BASIC METABOLIC PANEL
BUN: 12 mg/dL (ref 6–23)
Chloride: 101 mEq/L (ref 96–112)
Creatinine, Ser: 0.97 mg/dL (ref 0.50–1.10)
Glucose, Bld: 232 mg/dL — ABNORMAL HIGH (ref 70–99)
Potassium: 3.8 mEq/L (ref 3.5–5.1)

## 2012-06-12 LAB — CBC
HCT: 43.5 % (ref 36.0–46.0)
Hemoglobin: 14.9 g/dL (ref 12.0–15.0)
MCHC: 34.3 g/dL (ref 30.0–36.0)
MCV: 85.5 fL (ref 78.0–100.0)
WBC: 6.4 10*3/uL (ref 4.0–10.5)

## 2012-06-12 MED ORDER — MOXIFLOXACIN HCL IN NACL 400 MG/250ML IV SOLN
400.0000 mg | Freq: Once | INTRAVENOUS | Status: AC
  Administered 2012-06-12: 400 mg via INTRAVENOUS
  Filled 2012-06-12: qty 250

## 2012-06-12 MED ORDER — PREDNISONE 20 MG PO TABS: 40.0000 mg | ORAL_TABLET | Freq: Every day | ORAL | Status: DC

## 2012-06-12 MED ORDER — ALBUTEROL (5 MG/ML) CONTINUOUS INHALATION SOLN
INHALATION_SOLUTION | RESPIRATORY_TRACT | Status: AC
  Administered 2012-06-12: 5 mg via RESPIRATORY_TRACT
  Filled 2012-06-12: qty 40

## 2012-06-12 MED ORDER — IPRATROPIUM BROMIDE 0.02 % IN SOLN
0.5000 mg | Freq: Once | RESPIRATORY_TRACT | Status: AC
  Administered 2012-06-12: 0.5 mg via RESPIRATORY_TRACT

## 2012-06-12 MED ORDER — ALBUTEROL SULFATE (5 MG/ML) 0.5% IN NEBU: 5.0000 mg | INHALATION_SOLUTION | Freq: Once | RESPIRATORY_TRACT | Status: DC

## 2012-06-12 MED ORDER — ALBUTEROL SULFATE (5 MG/ML) 0.5% IN NEBU
2.5000 mg | INHALATION_SOLUTION | Freq: Once | RESPIRATORY_TRACT | Status: AC
  Administered 2012-06-12: 2.5 mg via RESPIRATORY_TRACT

## 2012-06-12 MED ORDER — IPRATROPIUM BROMIDE 0.02 % IN SOLN
0.5000 mg | Freq: Once | RESPIRATORY_TRACT | Status: AC
  Administered 2012-06-12: 0.5 mg via RESPIRATORY_TRACT
  Filled 2012-06-12 (×2): qty 2.5

## 2012-06-12 MED ORDER — IOHEXOL 350 MG/ML SOLN
100.0000 mL | Freq: Once | INTRAVENOUS | Status: AC | PRN
  Administered 2012-06-12: 100 mL via INTRAVENOUS

## 2012-06-12 MED ORDER — SODIUM CHLORIDE 0.9 % IV BOLUS (SEPSIS): 1000.0000 mL | Freq: Once | INTRAVENOUS | Status: DC

## 2012-06-12 MED ORDER — METHYLPREDNISOLONE SODIUM SUCC 125 MG IJ SOLR
125.0000 mg | Freq: Once | INTRAMUSCULAR | Status: AC
  Administered 2012-06-12: 125 mg via INTRAVENOUS
  Filled 2012-06-12: qty 2

## 2012-06-12 MED ORDER — ALBUTEROL SULFATE (5 MG/ML) 0.5% IN NEBU
5.0000 mg | INHALATION_SOLUTION | Freq: Once | RESPIRATORY_TRACT | Status: AC
  Administered 2012-06-12 (×2): 5 mg via RESPIRATORY_TRACT
  Filled 2012-06-12: qty 40

## 2012-06-12 MED ORDER — ALBUTEROL SULFATE (5 MG/ML) 0.5% IN NEBU
5.0000 mg | INHALATION_SOLUTION | Freq: Once | RESPIRATORY_TRACT | Status: DC
  Filled 2012-06-12: qty 40
  Filled 2012-06-12: qty 20

## 2012-06-12 MED ORDER — ALBUTEROL SULFATE (5 MG/ML) 0.5% IN NEBU
5.0000 mg | INHALATION_SOLUTION | Freq: Once | RESPIRATORY_TRACT | Status: AC
  Administered 2012-06-12: 5 mg via RESPIRATORY_TRACT
  Filled 2012-06-12: qty 20
  Filled 2012-06-12: qty 40
  Filled 2012-06-12: qty 1

## 2012-06-12 MED ORDER — IPRATROPIUM BROMIDE 0.02 % IN SOLN
RESPIRATORY_TRACT | Status: AC
  Filled 2012-06-12: qty 2.5

## 2012-06-12 MED ORDER — LEVOFLOXACIN 500 MG PO TABS: 750.0000 mg | ORAL_TABLET | Freq: Every day | ORAL | Status: DC

## 2012-06-12 NOTE — ED Provider Notes (Signed)
5:45 PM Patient with a hx sig for COPD was placed in CDU on observation protocol by Dr. Karma Ganja. Patient care resumed from Dr. Karma Ganja .  Patient is here for nebulizer treatments and has received nebulizer treatments. While in obeservation over night the pt slept well and had no complaints, per nursing staff. Patient re-evaluated and is resting comfortable, VSS, with no new complaints or concerns at this time. Plan per previous provider is to give serial nebulizer treatments until wheezing has improved. On exam: hemodynamically stable, NAD, heart w/ RRR, lungs wheezing throughout bilaterally, Chest & abd non-tender, no peripheral edema or calf tenderness.   8:38 PM Patient feeling better and has received nebulizer treatment. She would like to go home without any further treatment. I will discharge her with 5 day course prednisone. She should return with worsening or concerning symptoms.   Filed Vitals:   06/12/12 2000  BP: 171/81  Pulse: 108  Temp:   Resp:      Gwendolyn Beck, PA-C 06/12/12 2224

## 2012-06-12 NOTE — ED Notes (Signed)
Pt ambulated to restroom. 

## 2012-06-12 NOTE — ED Notes (Signed)
Pt presents with SOB and labored breathing. Pt also states that she has a cough that causes rib pain. Onset of symptoms was Saturday with progressive worsening.

## 2012-06-12 NOTE — ED Notes (Signed)
Patient transported to X-ray 

## 2012-06-12 NOTE — ED Notes (Signed)
Patient has eaten a meal her family brought her and a diabetic meal tray.

## 2012-06-12 NOTE — ED Notes (Signed)
AIDET performed. 

## 2012-06-12 NOTE — ED Notes (Signed)
Per pt 2 weeks of worsening SOB with cough and rib pain when she coughs. Pt audible wheezing and congestion

## 2012-06-12 NOTE — ED Notes (Signed)
Patient in room eating [employer] chicken that family brought from outside of hospital, given sprite zero.

## 2012-06-12 NOTE — ED Provider Notes (Addendum)
History     CSN: [id number]  Arrival date & time 06/12/12  1232   First MD Initiated Contact with Patient 06/12/12 1244      Chief Complaint  Patient presents with  . Cough  . Wheezing  . Shortness of Breath    (Consider location/radiation/quality/duration/timing/severity/associated sxs/prior treatment) HPI Pt presents with c/o wheezing and shortness of breath.  She has also had cough which is nonproductive.  Chest pain with coughing only.  No leg swelling.  Has also had subjective fever.  Has had decreased appetite, but no vomiting or diarrhea. Has been using albuterol inhaler mulitple times a day with minimal relief.  There are no other associated systemic symptoms, there are no other alleviating or modifying factors.   Past Medical History  Diagnosis Date  . Diabetes mellitus   . Hypertension   . Arthritis     Past Surgical History  Procedure Date  . Cholecystectomy   . Colon surgery   . Esophagus dilated   . Arthroscopic knee surgery     Family History  Problem Relation Age of Onset  . Diabetes Mother   . Hypertension Mother   . Cancer Mother   . Diabetes Father   . Hypertension Father   . Diabetes Brother     History  Substance Use Topics  . Smoking status: Never Smoker   . Smokeless tobacco: Not on file  . Alcohol Use: No    OB History    Grav Para Term Preterm Abortions TAB SAB Ect Mult Living                  Review of Systems ROS reviewed and all otherwise negative except for mentioned in HPI  Allergies  Review of patient's allergies indicates no known allergies.  Home Medications   Current Outpatient Rx  Name Route Sig Dispense Refill  . DEXLANSOPRAZOLE 60 MG PO CPDR Oral Take 60 mg by mouth daily.    [name] FLUTICASONE-SALMETEROL 250-50 MCG/DOSE IN AEPB Inhalation Inhale 1 puff into the lungs every 12 (twelve) hours.    . INSULIN ASPART 100 UNIT/ML [place] SOLN Subcutaneous Inject 30 Units into the skin 2 (two) times daily.     . INSULIN  GLARGINE 100 UNIT/ML [place] SOLN Subcutaneous Inject 50 Units into the skin 2 (two) times daily.     . ADULT MULTIVITAMIN W/MINERALS CH Oral Take 1 tablet by mouth daily.    [name] PREGABALIN 50 MG PO CAPS Oral Take 50 mg by mouth 3 (three) times daily.      BP 188/98  Pulse 93  Temp 97.7 F (36.5 C) (Oral)  Resp 24  SpO2 95% Vitals reviewed Physical Exam Physical Examination: General appearance - alert, well appearing, and in no distress Mental status - alert, oriented to person, place, and time Mouth - mucous membranes moist, pharynx normal without lesions Chest - clear to auscultation, no wheezes, rales or rhonchi, symmetric air entry Heart - normal rate, regular rhythm, normal S1, S2, no murmurs, rubs, clicks or gallops Abdomen - soft, nontender, nondistended, no masses or organomegaly Extremities - peripheral pulses normal, no pedal edema, no clubbing or cyanosis Skin - normal coloration and turgor, no rashes  ED Course  Procedures (including critical care time)   Date: 06/12/2012  Rate: 103  Rhythm: sinus tachycardia  QRS Axis: normal  Intervals: normal  ST/T Wave abnormalities: normal  Conduction Disutrbances:none  Narrative Interpretation: poor r wave progression  Old EKG Reviewed: unchanged compared to prior ekg of 09/19/11  Labs Reviewed  CBC  BASIC METABOLIC PANEL   Dg Chest 2 View  06/12/2012  *RADIOLOGY REPORT*  Clinical Data: Shortness of breath and cough  CHEST - 2 VIEW  Comparison: 12/18/2011  Findings: The heart and pulmonary vascularity are within normal limits.  The lungs are well-aerated bilaterally.  There is a somewhat rounded area of density identified in the right mid lung on the frontal projection.  This is not well visualized on the lateral projection. This may simply represent a mild area of infiltrate although the possibility of a mass lesion would deserve consideration.  Further evaluation by means of CT is recommended.  IMPRESSION: Somewhat nodular  appearing density in the right mid lung as described this is new from prior exam.  Further evaluation by means of CT is recommended.   Original Report Authenticated By: Phillips Odor, M.D.      No diagnosis found.    MDM  Pt presents with c/o shortness of breath and coughing.  She has no obvious pneumonia on CXR- radiology recommends a CT chest which has been ordered.  Pt is feeling improved after breathign treatments but having continued wheezing.  Was given solumedrol by EMS.    3:53 PM  CT scan shows possible area of inflammation due to bronchitis or pneumonia.  PT started on moxifloxacin in the ED.  I believe she is stable for CDU obs bronchospasm protocol at this time.  Will need to be discharged on both steroids and to finish her course of moxifloxacin.    4:01 PM  D/w patient, she is hesitant to stay and is requesting to be discharged, she continues to have wheezing and I have advised her to stay for observation and further nebs.  She is in agreement for now, but would like to be discharged.         Ethelda Chick, MD 06/12/12 1544  Ethelda Chick, MD 06/12/12 1554  Ethelda Chick, MD 06/12/12 [phone]

## 2012-06-18 NOTE — ED Provider Notes (Signed)
Medical screening examination/treatment/procedure(s) were conducted as a shared visit with non-physician practitioner(s) and myself.  I personally evaluated the patient during the encounter  Ethelda Chick, MD 06/18/12 [phone]

## 2012-08-28 ENCOUNTER — Other Ambulatory Visit: Payer: Self-pay | Admitting: Podiatry

## 2012-09-10 ENCOUNTER — Emergency Department (HOSPITAL_COMMUNITY)
Admission: EM | Admit: 2012-09-10 | Discharge: 2012-09-10 | Disposition: A | Discharge status: Home / Self Care | Payer: PRIVATE HEALTH INSURANCE | Attending: Emergency Medicine | Admitting: Emergency Medicine

## 2012-09-10 ENCOUNTER — Encounter (HOSPITAL_COMMUNITY): Payer: Self-pay | Admitting: Emergency Medicine

## 2012-09-10 DIAGNOSIS — I10 Essential (primary) hypertension: Secondary | ICD-10-CM | POA: Insufficient documentation

## 2012-09-10 DIAGNOSIS — Z8739 Personal history of other diseases of the musculoskeletal system and connective tissue: Secondary | ICD-10-CM | POA: Insufficient documentation

## 2012-09-10 DIAGNOSIS — M79609 Pain in unspecified limb: Secondary | ICD-10-CM

## 2012-09-10 DIAGNOSIS — IMO0002 Reserved for concepts with insufficient information to code with codable children: Secondary | ICD-10-CM | POA: Insufficient documentation

## 2012-09-10 DIAGNOSIS — M5410 Radiculopathy, site unspecified: Secondary | ICD-10-CM

## 2012-09-10 DIAGNOSIS — Z7982 Long term (current) use of aspirin: Secondary | ICD-10-CM | POA: Insufficient documentation

## 2012-09-10 DIAGNOSIS — R55 Syncope and collapse: Secondary | ICD-10-CM | POA: Insufficient documentation

## 2012-09-10 DIAGNOSIS — Z79899 Other long term (current) drug therapy: Secondary | ICD-10-CM | POA: Insufficient documentation

## 2012-09-10 DIAGNOSIS — N390 Urinary tract infection, site not specified: Secondary | ICD-10-CM | POA: Insufficient documentation

## 2012-09-10 DIAGNOSIS — Z794 Long term (current) use of insulin: Secondary | ICD-10-CM | POA: Insufficient documentation

## 2012-09-10 DIAGNOSIS — E119 Type 2 diabetes mellitus without complications: Secondary | ICD-10-CM | POA: Insufficient documentation

## 2012-09-10 DIAGNOSIS — M797 Fibromyalgia: Secondary | ICD-10-CM | POA: Insufficient documentation

## 2012-09-10 HISTORY — DX: Fibromyalgia: M79.7

## 2012-09-10 LAB — URINALYSIS, ROUTINE W REFLEX MICROSCOPIC
Glucose, UA: >1000 mg/dL — AB
Ketones, ur: NEGATIVE mg/dL
pH: 6 (ref 5.0–8.0)

## 2012-09-10 LAB — URINE MICROSCOPIC-ADD ON

## 2012-09-10 MED ORDER — HYDROCODONE-ACETAMINOPHEN 5-325 MG PO TABS
1.0000 | ORAL_TABLET | ORAL | Status: DC | PRN
Start: 2012-09-10 — End: 2013-05-02

## 2012-09-10 MED ORDER — CEPHALEXIN 250 MG PO CAPS
500.0000 mg | ORAL_CAPSULE | Freq: Once | ORAL | Status: AC
  Administered 2012-09-10: 500 mg via ORAL
  Filled 2012-09-10: qty 2

## 2012-09-10 MED ORDER — CEPHALEXIN 500 MG PO CAPS: 500.0000 mg | ORAL_CAPSULE | Freq: Four times a day (QID) | ORAL | Status: DC

## 2012-09-10 MED ORDER — OXYCODONE-ACETAMINOPHEN 5-325 MG PO TABS
1.0000 | ORAL_TABLET | Freq: Once | ORAL | Status: AC
  Administered 2012-09-10: 1 via ORAL
  Filled 2012-09-10: qty 1
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
## 2012-09-10 NOTE — ED Provider Notes (Signed)
History     CSN: [id number]  Arrival date & time 09/10/12  1209   First MD Initiated Contact with Patient 09/10/12 1306      Chief Complaint  Patient presents with  . Leg Pain    (Consider location/radiation/quality/duration/timing/severity/associated sxs/prior treatment) HPI  62 year old female with hx of diabetes, arthritis, and fibromyalgia presents c/o R leg pain.  Pt reports for the past 2 weeks she has had gradual onset of throbbing, achy pain to her R thigh.  Pain feels like pins and needle, persistent, worse at mid day, and worse with movement.  Pain occasionally radiates to lower back, moderate in severity and mildly improve with taking pain medication that she was prescribed for her carpal tunnel syndrome. Pt also experience burning on urination for the past 4 days, but denies hematuria, vaginal discharge or rash.  No fever, chills, cp, sob, abd pain, or weakness.  No recent trauma.  Denies lightheadedness, or dizziness.  No bowel or urinary incontinence, or saddle paresthesia.  Pt does report having surgery for carpal tunnel release of L wrist on Dec 13th.  She is concern about blood clot, no prior hx of blood clot. No chest pain or shortness of breath, no DOE, no hemoptysis.    Past Medical History  Diagnosis Date  . Diabetes mellitus   . Hypertension   . Arthritis   . Fibromyalgia     Past Surgical History  Procedure Date  . Cholecystectomy   . Colon surgery   . Esophagus dilated   . Arthroscopic knee surgery     Family History  Problem Relation Age of Onset  . Diabetes Mother   . Hypertension Mother   . Cancer Mother   . Diabetes Father   . Hypertension Father   . Diabetes Brother     History  Substance Use Topics  . Smoking status: Never Smoker   . Smokeless tobacco: Not on file  . Alcohol Use: No    OB History    Grav Para Term Preterm Abortions TAB SAB Ect Mult Living                  Review of Systems  Constitutional: Negative for fever.       10 Systems reviewed and all are negative for acute change except as noted in the HPI.   Musculoskeletal: Positive for back pain.  Skin: Negative for rash and wound.  Neurological: Positive for numbness.    Allergies  Codeine  Home Medications   Current Outpatient Rx  Name  Route  Sig  Dispense  Refill  . ACETAMINOPHEN-CODEINE #3 300-30 MG PO TABS   Oral   Take 1-2 tablets by mouth every 4 (four) hours as needed. For pain         . ALBUTEROL SULFATE HFA 108 (90 BASE) MCG/ACT IN AERS   Inhalation   Inhale 2 puffs into the lungs every 6 (six) hours as needed. For shortness of breath         . AMLODIPINE BESYLATE 5 MG PO TABS   Oral   Take 5 mg by mouth daily.         . ASPIRIN 81 MG PO CHEW   Oral   Chew 81 mg by mouth daily.         [name] CANAGLIFLOZIN 300 MG PO TABS   Oral   Take 300 mg by mouth daily.         . DEXLANSOPRAZOLE 60 MG PO CPDR  Oral   Take 60 mg by mouth daily.         [name] FLUTICASONE-SALMETEROL 250-50 MCG/DOSE IN AEPB   Inhalation   Inhale 1 puff into the lungs 2 (two) times daily as needed. For shortness of breath         . GABAPENTIN 300 MG PO CAPS   Oral   Take 300 mg by mouth daily.         [name] HYDROCODONE-ACETAMINOPHEN 5-325 MG PO TABS   Oral   Take 1 tablet by mouth 2 (two) times daily.         [name] HYDROXYZINE HCL 25 MG PO TABS   Oral   Take 25-50 mg by mouth every 6 (six) hours as needed. For itching         . INSULIN ASPART 100 UNIT/ML [place] SOLN   Subcutaneous   Inject 20-26 Units into the skin 3 (three) times daily before meals. 20 units with  breakfast, 23 units lunch, 26 units dinner         . INSULIN GLARGINE 100 UNIT/ML [place] SOLN   Subcutaneous   Inject 16-60 Units into the skin 2 (two) times daily. 60 units in the morning and 16 units at bedtime         . LIRAGLUTIDE 18 MG/3ML [place] SOLN   Subcutaneous   Inject 1.8 mg into the skin at bedtime.          [name] LOSARTAN POTASSIUM-HCTZ 100-25 MG PO TABS   Oral    Take 1 tablet by mouth daily.         . ADULT MULTIVITAMIN W/MINERALS CH   Oral   Take 1 tablet by mouth daily.         [name] NAPROXEN 500 MG PO TABS   Oral   Take 500 mg by mouth 2 (two) times daily with a meal. For pain         . PRAVASTATIN SODIUM 40 MG PO TABS   Oral   Take 40 mg by mouth daily.            BP 180/85  Pulse 105  Temp 98 F (36.7 C) (Oral)  Resp 18  SpO2 95%  Physical Exam  Nursing note and vitals reviewed. Constitutional: She is oriented to person, place, and time. She appears well-developed and well-nourished. No distress.       Awake, alert, nontoxic appearance  HENT:  Head: Atraumatic.  Eyes: Conjunctivae normal are normal. Right eye exhibits no discharge. Left eye exhibits no discharge.  Neck: Neck supple.  Cardiovascular: Normal rate, regular rhythm and intact distal pulses.   Pulmonary/Chest: Effort normal. No respiratory distress. She exhibits no tenderness.  Abdominal: Soft. There is no tenderness. There is no rebound.       No CVA tenderness, no abdominal pulsatile mass.   Musculoskeletal: She exhibits tenderness (tenderness to R paralumbar region, worsening with R hip flexion.  positive straight le raise.  Patella DTR 2+.  No foot drop.  distal pulse intact, sensation intact). She exhibits no edema.       ROM appears intact, no obvious focal weakness  L wrist: soft cast in place.  Brisk cap refills to distal fingers  BLE: no palpable cords, erythema, calves tenderness, or leg swelling.  Homan sign negative. Intact distal pulses  Neurological: She is alert and oriented to person, place, and time. She has normal reflexes.       Mental status and motor strength appears intact  Skin: No rash noted.  Psychiatric: She has a normal mood and affect.    ED Course  Procedures (including critical care time)  Results for orders placed during the hospital encounter of 09/10/12  URINALYSIS, ROUTINE W REFLEX MICROSCOPIC      Component Value Range    Color, Urine YELLOW  YELLOW   APPearance CLOUDY (*) CLEAR   Specific Gravity, Urine 1.042 (*) 1.005 - 1.030   pH 6.0  5.0 - 8.0   Glucose, UA >1000 (*) NEGATIVE mg/dL   Hgb urine dipstick TRACE (*) NEGATIVE   Bilirubin Urine NEGATIVE  NEGATIVE   Ketones, ur NEGATIVE  NEGATIVE mg/dL   Protein, ur NEGATIVE  NEGATIVE mg/dL   Urobilinogen, UA 0.2  0.0 - 1.0 mg/dL   Nitrite NEGATIVE  NEGATIVE   Leukocytes, UA SMALL (*) NEGATIVE  URINE MICROSCOPIC-ADD ON      Component Value Range   Squamous Epithelial / LPF FEW (*) RARE   WBC, UA 21-50  <3 WBC/hpf   RBC / HPF 0-2  <3 RBC/hpf   Bacteria, UA RARE  RARE   Urine-Other MANY YEAST     No results found.  [name], [name]  Female  February 08, 1951  [id number]            Progress Notes signed by [name] at 09/10/12 1552     Author:  [name]  Service:  Vascular Lab  Author Type:  Cardiovascular Sonographer   Filed:  09/10/12 1552  Note Time:  09/10/12 1552          *PRELIMINARY RESULTS*  Vascular Ultrasound  Right lower extremity venous duplex has been completed. Preliminary findings: Right: No evidence of DVT, superficial thrombosis, or Baker's cyst.  Farrel Demark, RDMS, RVT  09/10/2012, 3:52 PM    1. UTI 2. Radicular pain, R leg  MDM  R thigh pain x 2 weeks, worse with positional changes.  Also c/o burning on urination.    3:24 PM UA shows evidence of UTI.  Will treat with Keflex.  Urine culture sent.  Since pt has R thigh pain, no specific injury, has recent surgery and is concerning of blood clot, will obtain vascular study to r/ DVT.  Low suspicion.     4:12 PM DVT neg.  Reassurance given.  Pt to f/u with PCP for further care.  Pt able to ambulate.  Pt was made aware that BP is high and will need to have it recheck.    BP 180/85  Pulse 105  Temp 98 F (36.7 C) (Oral)  Resp 18  SpO2 95%  I have reviewed nursing notes and vital signs. I personally reviewed the imaging tests through PACS system  I  reviewed available ER/hospitalization records thought the EMR      Fayrene Helper, [place] 09/10/12 1612

## 2012-09-10 NOTE — ED Provider Notes (Signed)
Medical screening examination/treatment/procedure(s) were performed by non-physician practitioner and as supervising physician I was immediately available for consultation/collaboration.   Glynn Octave, MD 09/10/12 [phone]

## 2012-09-10 NOTE — Progress Notes (Signed)
*  PRELIMINARY RESULTS* Vascular Ultrasound Right lower extremity venous duplex has been completed.  Preliminary findings: Right:  No evidence of DVT, superficial thrombosis, or Baker's cyst.   Farrel Demark, RDMS, RVT  09/10/2012, 3:52 PM

## 2012-12-08 ENCOUNTER — Other Ambulatory Visit: Payer: Self-pay

## 2012-12-08 DIAGNOSIS — Z1231 Encounter for screening mammogram for malignant neoplasm of breast: Secondary | ICD-10-CM

## 2012-12-26 ENCOUNTER — Ambulatory Visit
Admission: RE | Admit: 2012-12-26 | Discharge: 2012-12-26 | Disposition: A | Discharge status: Home / Self Care | Payer: PRIVATE HEALTH INSURANCE | Source: Ambulatory Visit

## 2012-12-26 DIAGNOSIS — Z1231 Encounter for screening mammogram for malignant neoplasm of breast: Secondary | ICD-10-CM

## 2013-01-08 ENCOUNTER — Other Ambulatory Visit: Payer: Self-pay | Admitting: *Deleted

## 2013-01-08 DIAGNOSIS — I83893 Varicose veins of bilateral lower extremities with other complications: Secondary | ICD-10-CM

## 2013-02-20 ENCOUNTER — Encounter: Payer: PRIVATE HEALTH INSURANCE | Admitting: Vascular Surgery

## 2013-03-13 ENCOUNTER — Other Ambulatory Visit: Payer: Self-pay | Admitting: *Deleted

## 2013-03-16 ENCOUNTER — Other Ambulatory Visit (INDEPENDENT_AMBULATORY_CARE_PROVIDER_SITE_OTHER): Payer: PRIVATE HEALTH INSURANCE

## 2013-03-16 DIAGNOSIS — IMO0001 Reserved for inherently not codable concepts without codable children: Secondary | ICD-10-CM

## 2013-03-16 LAB — URINALYSIS, ROUTINE W REFLEX MICROSCOPIC
Nitrite: NEGATIVE
Specific Gravity, Urine: 1.01 (ref 1.000–1.030)
Total Protein, Urine: NEGATIVE
Urobilinogen, UA: 0.2 (ref 0.0–1.0)

## 2013-03-16 LAB — COMPREHENSIVE METABOLIC PANEL
AST: 17 U/L (ref 0–37)
Alkaline Phosphatase: 97 U/L (ref 39–117)
Glucose, Bld: 94 mg/dL (ref 70–99)
Potassium: 3.5 mEq/L (ref 3.5–5.1)
Sodium: 139 mEq/L (ref 135–145)
Total Bilirubin: 0.4 mg/dL (ref 0.3–1.2)
Total Protein: 7.7 g/dL (ref 6.0–8.3)

## 2013-03-16 LAB — MICROALBUMIN / CREATININE URINE RATIO
Creatinine,U: 123.2 mg/dL
Microalb, Ur: 3 mg/dL — ABNORMAL HIGH (ref 0.0–1.9)

## 2013-03-18 ENCOUNTER — Encounter: Payer: Self-pay | Admitting: Vascular Surgery

## 2013-03-18 ENCOUNTER — Ambulatory Visit (INDEPENDENT_AMBULATORY_CARE_PROVIDER_SITE_OTHER): Payer: PRIVATE HEALTH INSURANCE | Admitting: Endocrinology

## 2013-03-18 ENCOUNTER — Encounter: Payer: Self-pay | Admitting: Endocrinology

## 2013-03-18 ENCOUNTER — Other Ambulatory Visit: Payer: Self-pay | Admitting: *Deleted

## 2013-03-18 VITALS — BP 158/90 | HR 90 | Temp 98.0°F | Resp 12 | Ht 62.25 in | Wt 174.0 lb

## 2013-03-18 DIAGNOSIS — I10 Essential (primary) hypertension: Secondary | ICD-10-CM

## 2013-03-18 MED ORDER — GLUCOSE BLOOD VI STRP: ORAL_STRIP | Status: DC

## 2013-03-18 MED ORDER — ACCU-CHEK FASTCLIX LANCETS MISC: 3.0000 | Freq: Three times a day (TID) | Status: DC

## 2013-03-18 MED ORDER — INSULIN GLARGINE 100 UNIT/ML SOLOSTAR PEN: 60.0000 [IU] | PEN_INJECTOR | Freq: Two times a day (BID) | SUBCUTANEOUS | Status: DC

## 2013-03-18 MED ORDER — INSULIN REGULAR HUMAN (CONC) 500 UNIT/ML ~~LOC~~ SOLN: SUBCUTANEOUS | Status: DC

## 2013-03-18 NOTE — Progress Notes (Signed)
Patient ID: [name], female   DOB: 08-07-1951, 62 y.o.   MRN: [id number]  Reason for Appointment: Diabetes follow-up   History of Present Illness    Diagnosis: date of diagnosis:1985.  Complications: No retinopathy or known nephropathy.  Side effects from medications: None.  Insulin delivery device: Pens.  Monitors blood glucose: Three times a day.  Glucometer: Accucheck Aviva. Unable to download recent readings  Blood Glucose readings: am 268, 120 pcl 429 acs 259 437 Hs 197  Hypoglycemic awareness: Rarely unaware of hypoglycemia, Usually develops symptoms when blood glucose is less than 100.  Meals: 3 meals per day, Consistent schedule.  Carbohydrate intake: limited amounts.  Food preferences: She has 2 toast and boiled eggs for breakfast;Breakfast egg and toast, Eng Muffin no cereal, usually has a sandwich and salad for lunch and vegetables for dinner with fish or lean meat for protein. Will snack on apples, raisons or wheat thins.   Physical activity: exercise: 60 min, 5/7 days a week.  Dietician visit: Most recent: 2012  Retinal exam: Most recent: 2010.    PAST history: She has had difficult to control diabetes requiring large doses of insulin especially at meal times. Because of poor control she was switched from NovoLog to U-500 insulin in April. Initially seemed to have better blood sugars. Also because of no clear response with Invokana this was stopped and she was started on Actos. Her insulin was adjusted with higher mealtime coverage at breakfast and lunch and lower amounts at supper. Lantus has been unchanged. She is still continuing on Victoza also .   RECENT history: She was told to restart Invokana. Her blood sugars are relatively better in the morning but they are usually the highest in the early afternoon despite taking increased doses of the U 500 insulin in the morning Overall is still inadequately controlled although blood sugars are somewhat better than the  last visit  She does not think that her new meter is accurate and is reading higher than the Accu-Chek. Her lab glucose was only 96  She had lost 3 pounds with restarting her Invokana   The last HbgA1c was 9%in 4/14 , previously 8.5 in 9/13 and 7.2 in 12/13, and the fructosamine was 326.   The insulin regimen is described as Lantus 60-16, U-500 08-04-09 before meals.     HYPERTENSION:  home blood pressure has been about 158/80-90   Appointment on 03/16/2013  Component Date Value Range Status  . Hemoglobin A1C 03/16/2013 10.3* 4.6 - 6.5 % Final   Glycemic Control Guidelines for People with Diabetes:Non Diabetic:  <6%Goal of Therapy: <7%Additional Action Suggested:  >8%   . Sodium 03/16/2013 139  135 - 145 mEq/L Final  . Potassium 03/16/2013 3.5  3.5 - 5.1 mEq/L Final  . Chloride 03/16/2013 101  96 - 112 mEq/L Final  . CO2 03/16/2013 30  19 - 32 mEq/L Final  . Glucose, Bld 03/16/2013 94  70 - 99 mg/dL Final  . BUN 40/98/1191 10  6 - 23 mg/dL Final  . Creatinine, Ser 03/16/2013 1.1  0.4 - 1.2 mg/dL Final  . Total Bilirubin 03/16/2013 0.4  0.3 - 1.2 mg/dL Final  . Alkaline Phosphatase 03/16/2013 97  39 - 117 U/L Final  . AST 03/16/2013 17  0 - 37 U/L Final  . ALT 03/16/2013 15  0 - 35 U/L Final  . Total Protein 03/16/2013 7.7  6.0 - 8.3 g/dL Final  . Albumin 47/82/9562 3.6  3.5 - 5.2 g/dL  Final  . Calcium 03/16/2013 9.3  8.4 - 10.5 mg/dL Final  . GFR 78/29/5621 63.67  >60.00 mL/min Final  . Microalb, Ur 03/16/2013 3.0* 0.0 - 1.9 mg/dL Final  . Creatinine,U 30/86/5784 123.2   Final  . Microalb Creat Ratio 03/16/2013 2.4  0.0 - 30.0 mg/g Final  . Color, Urine 03/16/2013 LT. YELLOW  Yellow;Lt. Yellow Final  . APPearance 03/16/2013 CLEAR  Clear Final  . Specific Gravity, Urine 03/16/2013 1.010  1.000-1.030 Final  . pH 03/16/2013 6.0  5.0 - 8.0 Final  . Total Protein, Urine 03/16/2013 NEGATIVE  Negative Final  . Urine Glucose 03/16/2013 >=1000  Negative Final  . Ketones, ur 03/16/2013  NEGATIVE  Negative Final  . Bilirubin Urine 03/16/2013 NEGATIVE  Negative Final  . Hgb urine dipstick 03/16/2013 TRACE-INTACT  Negative Final  . Urobilinogen, UA 03/16/2013 0.2  0.0 - 1.0 Final  . Leukocytes, UA 03/16/2013 TRACE  Negative Final  . Nitrite 03/16/2013 NEGATIVE  Negative Final  . WBC, UA 03/16/2013 0-2/hpf  0-2/hpf Final  . RBC / HPF 03/16/2013 0-2/hpf  0-2/hpf Final  . Mucus, UA 03/16/2013 Presence of  None Final  . Squamous Epithelial / LPF 03/16/2013 Few(5-10/hpf)  Rare(0-4/hpf) Final  . Bacteria, UA 03/16/2013 Rare(<10/hpf)  None Final  . Yeast, UA 03/16/2013 Presence of  None Final      Medication List       This list is accurate as of: 03/18/13 10:10 AM.  Always use your most recent med list.               albuterol 108 (90 BASE) MCG/ACT inhaler  Commonly known as:  PROVENTIL HFA;VENTOLIN HFA  Inhale 2 puffs into the lungs every 6 (six) hours as needed. For shortness of breath     amLODipine 5 MG tablet  Commonly known as:  NORVASC  Take 5 mg by mouth daily.     aspirin 81 MG chewable tablet  Chew 81 mg by mouth daily.     cephALEXin 500 MG capsule  Commonly known as:  KEFLEX  Take 1 capsule (500 mg total) by mouth 4 (four) times daily.     DEXILANT 60 MG capsule  Generic drug:  dexlansoprazole  Take 60 mg by mouth daily.     Fluticasone-Salmeterol 250-50 MCG/DOSE Aepb  Commonly known as:  ADVAIR  Inhale 1 puff into the lungs 2 (two) times daily as needed. For shortness of breath     gabapentin 300 MG capsule  Commonly known as:  NEURONTIN  Take 300 mg by mouth daily.     HYDROcodone-acetaminophen 5-325 MG per tablet  Commonly known as:  NORCO/VICODIN  Take 1 tablet by mouth every 4 (four) hours as needed for pain.     hydrOXYzine 25 MG tablet  Commonly known as:  ATARAX/VISTARIL  Take 25-50 mg by mouth every 6 (six) hours as needed. For itching     insulin aspart 100 UNIT/ML injection  Commonly known as:  novoLOG  Inject 20-26 Units  into the skin 3 (three) times daily before meals. 20 units with  breakfast, 23 units lunch, 26 units dinner     insulin glargine 100 UNIT/ML injection  Commonly known as:  LANTUS  Inject 16-60 Units into the skin 2 (two) times daily. 60 units in the morning and 16 units at bedtime     LANTUS SOLOSTAR 100 UNIT/ML Sopn  Generic drug:  Insulin Glargine  Inject 60 Units into the skin. Take 60 units in the am and  16 units at bedtime     insulin regular 100 units/mL injection  Commonly known as:  NOVOLIN R,HUMULIN R  Inject 12 Units into the skin 3 (three) times daily before meals. 12 units for breakfast, 9 units at lunch and 12 units at supper     INVOKANA 300 MG Tabs  Generic drug:  Canagliflozin  Take 300 mg by mouth daily.     losartan-hydrochlorothiazide 100-25 MG per tablet  Commonly known as:  HYZAAR  Take 1 tablet by mouth daily.     multivitamin with minerals Tabs  Take 1 tablet by mouth daily.     naproxen 500 MG tablet  Commonly known as:  NAPROSYN  Take 500 mg by mouth 2 (two) times daily with a meal. For pain     pravastatin 40 MG tablet  Commonly known as:  PRAVACHOL  Take 40 mg by mouth daily.     VICTOZA 18 MG/3ML Soln injection  Generic drug:  Liraglutide  Inject 1.8 mg into the skin at bedtime.        Allergies:  Allergies  Allergen Reactions  . Codeine Itching and Nausea And Vomiting    Past Medical History  Diagnosis Date  . Diabetes mellitus   . Hypertension   . Arthritis   . Fibromyalgia     Past Surgical History  Procedure Laterality Date  . Cholecystectomy    . Colon surgery    . Esophagus dilated    . Arthroscopic knee surgery      Family History  Problem Relation Age of Onset  . Diabetes Mother   . Hypertension Mother   . Cancer Mother   . Diabetes Father   . Hypertension Father   . Diabetes Brother     Social History:  reports that she has never smoked. She does not have any smokeless tobacco history on file. She reports  that she does not drink alcohol or use illicit drugs.  Review of Systems - Cardiovascular ROS: No history of CAD No history of recent pedal edema No history of burning or tingling in her feet   Examination:   BP 158/90  Pulse 90  Temp(Src) 98 F (36.7 C)  Resp 12  Ht 5' 2.25" (1.581 m)  Wt 174 lb (78.926 kg)  BMI 31.58 kg/m2  SpO2 99%  Body mass index is 31.58 kg/(m^2).   No pedal edema  Assesment:   Diabetes type 2, uncontrolled - 250.02  Her A1c has gone up because of higher readings over the last 2-3 months The patient's diabetes  appears to be still inadequately controlled although blood sugars are somewhat better than the last visit She does appear to have relatively higher readings may be an early afternoon despite taking increased doses of the U 500 insulin in the morning She does not think that her new meter is accurate and is reading higher than the Accu-Chek She had lost 3 pounds with restarting her Invokana She continues to exercise but he still insulin resistant  PLAN:  Increase the dose of Lantus and U-500 insulin in the morning Consider adding NovoLog in the morning if sugars are high at lunchtime, she will call if this happens Reduce evening Lantus slightly since readings can be as low as 94 in the morning as on the lab Continue Victoza and Invokana  Counseling time over 50% of today's 25 minute visit   Nakya Weyand 03/18/2013, 10:10 AM

## 2013-03-18 NOTE — Patient Instructions (Addendum)
Lantus 66 units in the morning  -10 in the evening,  U-500 insulin:  15 am- 10 lunch-10 before meals. Continue Victoza and Invokana Call if blood sugars are not improved and restart using Accu-Chek monitor

## 2013-03-19 ENCOUNTER — Ambulatory Visit (INDEPENDENT_AMBULATORY_CARE_PROVIDER_SITE_OTHER): Payer: PRIVATE HEALTH INSURANCE | Admitting: Vascular Surgery

## 2013-03-19 ENCOUNTER — Encounter: Payer: Self-pay | Admitting: Vascular Surgery

## 2013-03-19 ENCOUNTER — Encounter (INDEPENDENT_AMBULATORY_CARE_PROVIDER_SITE_OTHER): Payer: PRIVATE HEALTH INSURANCE | Admitting: *Deleted

## 2013-03-19 VITALS — BP 155/94 | HR 96 | Resp 18 | Ht 64.0 in | Wt 173.0 lb

## 2013-03-19 DIAGNOSIS — I83893 Varicose veins of bilateral lower extremities with other complications: Secondary | ICD-10-CM | POA: Insufficient documentation

## 2013-03-19 NOTE — Progress Notes (Signed)
The patient has today for evaluation of discomfort in her left anterior thigh and popliteal fossa. 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 She reports a sensation over this general area of heaviness and aching sensation. She also reports an itching sensation over this area. There is no history of DVT. She does have a history of venous pathology in a grandmother and also the same grandmother and up with bilateral lower amputation due to arterial insufficiency. She does not have any varicosities.  Past Medical History  Diagnosis Date  . Diabetes mellitus   . Hypertension   . Arthritis   . Fibromyalgia     History  Substance Use Topics  . Smoking status: Never Smoker   . Smokeless tobacco: Never Used  . Alcohol Use: No    Family History  Problem Relation Age of Onset  . Diabetes Mother   . Hypertension Mother   . Cancer Mother   . Diabetes Father   . Hypertension Father   . Diabetes Brother     Allergies  Allergen Reactions  . Codeine Itching and Nausea And Vomiting    Current outpatient prescriptions:ACCU-CHEK FASTCLIX LANCETS MISC, 3 each by Does not apply route 3 (three) times daily., Disp: 102 each, Rfl: 6;  albuterol (PROVENTIL HFA;VENTOLIN HFA) 108 (90 BASE) MCG/ACT inhaler, Inhale 2 puffs into the lungs every 6 (six) hours as needed. For shortness of breath, Disp: , Rfl: ;  amLODipine (NORVASC) 5 MG tablet, Take 5 mg by mouth daily., Disp: , Rfl:  aspirin 81 MG chewable tablet, Chew 81 mg by mouth daily., Disp: , Rfl: ;  Canagliflozin (INVOKANA) 300 MG TABS, Take 300 mg by mouth daily., Disp: , Rfl: ;  cephALEXin (KEFLEX) 500 MG capsule, Take 1 capsule (500 mg total) by mouth 4 (four) times daily., Disp: 28 capsule, Rfl: 0;  dexlansoprazole (DEXILANT) 60 MG capsule, Take 60 mg by mouth daily., Disp: , Rfl:  Fluticasone-Salmeterol (ADVAIR) 250-50 MCG/DOSE AEPB, Inhale 1  puff into the lungs 2 (two) times daily as needed. For shortness of breath, Disp: , Rfl: ;  gabapentin (NEURONTIN) 300 MG capsule, Take 300 mg by mouth daily., Disp: , Rfl: ;  glucose blood (ACCU-CHEK SMARTVIEW) test strip, Use as instructed, check blood sugars 3 times per day, Disp: 100 each, Rfl: 12 HYDROcodone-acetaminophen (NORCO/VICODIN) 5-325 MG per tablet, Take 1 tablet by mouth every 4 (four) hours as needed for pain., Disp: 15 tablet, Rfl: 0;  Insulin Glargine (LANTUS SOLOSTAR) 100 UNIT/ML SOPN, Inject 60 Units into the skin 2 (two) times daily. Take 60 units in the am and 16 units at bedtime, Disp: 5 pen, Rfl: 6 insulin regular human CONCENTRATED (HUMULIN R) 500 UNIT/ML SOLN injection, 12 units at breakfast, 9 units at lunch and 12 units at supper, Disp: 20 mL, Rfl: 3;  Liraglutide (VICTOZA) 18 MG/3ML SOLN, Inject 1.8 mg into the skin at bedtime. , Disp: , Rfl: ;  losartan-hydrochlorothiazide (HYZAAR) 100-25 MG per tablet, Take 1 tablet by mouth daily., Disp: , Rfl:  Multiple Vitamin (MULITIVITAMIN WITH MINERALS) TABS, Take 1 tablet by mouth daily., Disp: , Rfl: ;  pravastatin (PRAVACHOL) 40 MG tablet, Take 40 mg by mouth daily. , Disp: , Rfl: ;  hydrOXYzine (ATARAX/VISTARIL) 25 MG tablet, Take 25-50 mg by mouth every 6 (six) hours as needed. For itching, Disp: , Rfl:  insulin aspart (NOVOLOG) 100 UNIT/ML injection, Inject 20-26 Units into the  skin 3 (three) times daily before meals. 20 units with  breakfast, 23 units lunch, 26 units dinner, Disp: , Rfl: ;  naproxen (NAPROSYN) 500 MG tablet, Take 500 mg by mouth 2 (two) times daily with a meal. For pain, Disp: , Rfl:   BP 155/94  Pulse 96  Resp 18  Ht 5\' 4"  (1.626 m)  Wt 173 lb (78.472 kg)  BMI 29.68 kg/m2  Body mass index is 29.68 kg/(m^2).       Review of systems is positive for pain in her feet and legs and swelling in her legs. Otherwise all review of systems are negative  Physical exam well-developed female appearing stated  age Neurologically she is grossly intact Pulse status 2+ radial pulses 2+ dorsalis pedis pulses bilaterally Respirations are equal and non-labored She does not have any evidence of venous varicosities and no evidence of venous hypertension in her lower Shoney's. 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 This does not show any evidence of DVT and no evidence of significant reflux in her deep or surface veins bilaterally She had a rule out DVT study of minutes in the hospital in January 2014 and I reviewed this and this also showed no evidence of DVT  Impression and plan leg pain on the left anterior thigh and popliteal fossa of unclear etiology. I discussed this at length with the patient and her family present. I explained I do not see any evidence of arterial or venous pathology to explain this. I reassured her that she does not have anything has limb threatening. I did explain the potential option of sclerotherapy to the telangiectasia but feel that this would have very little chance of improving her symptoms. She was reassured this discussion will see [place] again on an as-needed basis

## 2013-04-17 ENCOUNTER — Encounter: Payer: PRIVATE HEALTH INSURANCE | Admitting: Vascular Surgery

## 2013-05-02 ENCOUNTER — Emergency Department (HOSPITAL_COMMUNITY)
Admission: EM | Admit: 2013-05-02 | Discharge: 2013-05-02 | Disposition: A | Discharge status: Home / Self Care | Payer: PRIVATE HEALTH INSURANCE | Attending: Emergency Medicine | Admitting: Emergency Medicine

## 2013-05-02 ENCOUNTER — Encounter (HOSPITAL_COMMUNITY): Payer: Self-pay | Admitting: *Deleted

## 2013-05-02 DIAGNOSIS — E876 Hypokalemia: Secondary | ICD-10-CM | POA: Insufficient documentation

## 2013-05-02 DIAGNOSIS — N390 Urinary tract infection, site not specified: Secondary | ICD-10-CM | POA: Insufficient documentation

## 2013-05-02 DIAGNOSIS — E119 Type 2 diabetes mellitus without complications: Secondary | ICD-10-CM | POA: Insufficient documentation

## 2013-05-02 DIAGNOSIS — Z792 Long term (current) use of antibiotics: Secondary | ICD-10-CM | POA: Insufficient documentation

## 2013-05-02 DIAGNOSIS — R51 Headache: Secondary | ICD-10-CM | POA: Insufficient documentation

## 2013-05-02 DIAGNOSIS — R300 Dysuria: Secondary | ICD-10-CM | POA: Insufficient documentation

## 2013-05-02 DIAGNOSIS — Z794 Long term (current) use of insulin: Secondary | ICD-10-CM | POA: Insufficient documentation

## 2013-05-02 DIAGNOSIS — Z79899 Other long term (current) drug therapy: Secondary | ICD-10-CM | POA: Insufficient documentation

## 2013-05-02 DIAGNOSIS — IMO0001 Reserved for inherently not codable concepts without codable children: Secondary | ICD-10-CM | POA: Insufficient documentation

## 2013-05-02 DIAGNOSIS — Z7982 Long term (current) use of aspirin: Secondary | ICD-10-CM | POA: Insufficient documentation

## 2013-05-02 DIAGNOSIS — R000 Tachycardia, unspecified: Secondary | ICD-10-CM | POA: Insufficient documentation

## 2013-05-02 DIAGNOSIS — R1110 Vomiting, unspecified: Secondary | ICD-10-CM | POA: Insufficient documentation

## 2013-05-02 DIAGNOSIS — M129 Arthropathy, unspecified: Secondary | ICD-10-CM | POA: Insufficient documentation

## 2013-05-02 DIAGNOSIS — I10 Essential (primary) hypertension: Secondary | ICD-10-CM

## 2013-05-02 LAB — COMPREHENSIVE METABOLIC PANEL
ALT: 14 U/L (ref 0–35)
Alkaline Phosphatase: 109 U/L (ref 39–117)
BUN: 8 mg/dL (ref 6–23)
CO2: 27 mEq/L (ref 19–32)
GFR calc Af Amer: 61 mL/min — ABNORMAL LOW (ref >90)
GFR calc non Af Amer: 52 mL/min — ABNORMAL LOW (ref >90)
Glucose, Bld: 203 mg/dL — ABNORMAL HIGH (ref 70–99)
Potassium: 3.2 mEq/L — ABNORMAL LOW (ref 3.5–5.1)
Sodium: 140 mEq/L (ref 135–145)
Total Bilirubin: 0.8 mg/dL (ref 0.3–1.2)
Total Protein: 8.1 g/dL (ref 6.0–8.3)

## 2013-05-02 LAB — CBC WITH DIFFERENTIAL/PLATELET
Eosinophils Absolute: 0 10*3/uL (ref 0.0–0.7)
Hemoglobin: 16.6 g/dL — ABNORMAL HIGH (ref 12.0–15.0)
Lymphocytes Relative: 19 % (ref 12–46)
Lymphs Abs: 1.4 10*3/uL (ref 0.7–4.0)
MCH: 31.3 pg (ref 26.0–34.0)
MCV: 87.9 fL (ref 78.0–100.0)
Monocytes Relative: 5 % (ref 3–12)
Neutrophils Relative %: 75 % (ref 43–77)
Platelets: 270 10*3/uL (ref 150–400)
RBC: 5.3 MIL/uL — ABNORMAL HIGH (ref 3.87–5.11)
WBC: 7 10*3/uL (ref 4.0–10.5)

## 2013-05-02 LAB — URINALYSIS, ROUTINE W REFLEX MICROSCOPIC
Hgb urine dipstick: NEGATIVE
Ketones, ur: 15 mg/dL — AB
Ketones, ur: 40 mg/dL — AB
Nitrite: POSITIVE — AB
Protein, ur: >300 mg/dL — AB
Specific Gravity, Urine: 1.025 (ref 1.005–1.030)
Urobilinogen, UA: 1 mg/dL (ref 0.0–1.0)
Urobilinogen, UA: 1 mg/dL (ref 0.0–1.0)

## 2013-05-02 LAB — URINE MICROSCOPIC-ADD ON

## 2013-05-02 LAB — POCT I-STAT TROPONIN I

## 2013-05-02 MED ORDER — POTASSIUM CHLORIDE CRYS ER 20 MEQ PO TBCR
40.0000 meq | EXTENDED_RELEASE_TABLET | Freq: Once | ORAL | Status: AC
  Administered 2013-05-02: 40 meq via ORAL
  Filled 2013-05-02: qty 2

## 2013-05-02 MED ORDER — SODIUM CHLORIDE 0.9 % IV BOLUS (SEPSIS): 500.0000 mL | Freq: Once | INTRAVENOUS | Status: DC

## 2013-05-02 MED ORDER — SODIUM CHLORIDE 0.9 % IV BOLUS (SEPSIS)
1000.0000 mL | Freq: Once | INTRAVENOUS | Status: AC
  Administered 2013-05-02: 1000 mL via INTRAVENOUS

## 2013-05-02 MED ORDER — ONDANSETRON 4 MG PO TBDP
4.0000 mg | ORAL_TABLET | Freq: Once | ORAL | Status: AC
  Administered 2013-05-02: 4 mg via ORAL
  Filled 2013-05-02: qty 1

## 2013-05-02 MED ORDER — CEPHALEXIN 500 MG PO CAPS: 500.0000 mg | ORAL_CAPSULE | Freq: Four times a day (QID) | ORAL | Status: DC

## 2013-05-02 MED ORDER — POTASSIUM CHLORIDE CRYS ER 20 MEQ PO TBCR: 20.0000 meq | EXTENDED_RELEASE_TABLET | Freq: Every day | ORAL | Status: DC

## 2013-05-02 NOTE — ED Provider Notes (Signed)
Complaint of gradual onset headache yesterday for  migraine she's had in the past last migraine was one year ago she's been getting migraines since age 62. She vomited 4 times yesterday. Her blood pressure is noted to be elevated. She feels much improved presently. Pain is minimal. On exam alert no distress HEENT exam no facial asymmetry neck supple no bruit heart tachycardic minimally tender at 104 by me lungs clear auscultation extremities no edema skin warm dry abdomen soft nontender neurologic Glasgow Coma Score 15 gait normal Romberg normal pronator drift normal tenderness 2-12 grossly intact. She's not lightheaded on standing  Doug Sou, MD 05/03/13 [phone]

## 2013-05-02 NOTE — ED Notes (Signed)
pts belongings placed in a belongings bed placed at bedside. Items include shoes shirts and pants and bag full of pts medications.

## 2013-05-02 NOTE — ED Provider Notes (Signed)
CSN: [id number]     Arrival date & time 05/02/13  1537 History   First MD Initiated Contact with Patient 05/02/13 1629     Chief Complaint  Patient presents with  . Headache  . Hypertension   (Consider location/radiation/quality/duration/timing/severity/associated sxs/prior Treatment) The history is provided by the patient.   62 year old female with PMH of DM, HTN and FM here with complaint of elevated blood pressure and a headache. Patient reports that her blood pressures have been elevated for the past 2 days. She reports she visited the pharmacy yesterday and that the pharmacist checked her blood pressure and it was 218/124. She reports having a left-sided headache at that time. She denies having any vision changes, vertigo, weakness, numbness, chest pain, shortness of breath, fevers or other complaints. She checked her blood pressure several times today at home and reports that it remained elevated. Just prior to arrival she reports a blood pressure of 198/119. She also reports 4 episodes of nonbilious nonbloody vomiting today. She denies abdominal pain, diarrhea and urinary frequency. She reports mild dysuria. She still reports a mild left-sided headache although she states she is ready to go home given the fact that her blood pressure has trended down.  The headache is greatly improved.  She denies vision changes, halos, jaw pain, neck pain, weakness, numbness, falls, vertigo, difficulty walking. She reports her headache feels similar to prior migraines. She reports feeling much better at this time.  Past Medical History  Diagnosis Date  . Diabetes mellitus   . Hypertension   . Arthritis   . Fibromyalgia    Past Surgical History  Procedure Laterality Date  . Cholecystectomy    . Colon surgery    . Esophagus dilated    . Arthroscopic knee surgery     Family History  Problem Relation Age of Onset  . Diabetes Mother   . Hypertension Mother   . Cancer Mother   . Diabetes Father   .  Hypertension Father   . Diabetes Brother    History  Substance Use Topics  . Smoking status: Never Smoker   . Smokeless tobacco: Never Used  . Alcohol Use: No   OB History   Grav Para Term Preterm Abortions TAB SAB Ect Mult Living                 Review of Systems  Constitutional: Negative for fever and chills.  HENT: Negative for sore throat, neck pain and neck stiffness.   Respiratory: Negative for choking and shortness of breath.   Cardiovascular: Negative for chest pain, palpitations and leg swelling.  Gastrointestinal: Negative for nausea, vomiting, abdominal pain and diarrhea.  Genitourinary: Positive for dysuria. Negative for frequency and decreased urine volume.  Musculoskeletal: Negative for back pain and gait problem.  Skin: Negative for rash.  Neurological: Negative for light-headedness.  All other systems reviewed and are negative.    Allergies  Codeine  Home Medications   Current Outpatient Rx  Name  Route  Sig  Dispense  Refill  . ACCU-CHEK FASTCLIX LANCETS MISC   Does not apply   3 each by Does not apply route 3 (three) times daily.   102 each   6   . albuterol (PROVENTIL HFA;VENTOLIN HFA) 108 (90 BASE) MCG/ACT inhaler   Inhalation   Inhale 2 puffs into the lungs every 6 (six) hours as needed. For shortness of breath         . amLODipine (NORVASC) 5 MG tablet   Oral  Take 5 mg by mouth daily.         [name] aspirin 81 MG chewable tablet   Oral   Chew 81 mg by mouth daily.         . Canagliflozin (INVOKANA) 300 MG TABS   Oral   Take 300 mg by mouth daily.         . cephALEXin (KEFLEX) 500 MG capsule   Oral   Take 1 capsule (500 mg total) by mouth 4 (four) times daily.   28 capsule   0   . dexlansoprazole (DEXILANT) 60 MG capsule   Oral   Take 60 mg by mouth daily.         . Fluticasone-Salmeterol (ADVAIR) 250-50 MCG/DOSE AEPB   Inhalation   Inhale 1 puff into the lungs 2 (two) times daily as needed. For shortness of breath          . gabapentin (NEURONTIN) 300 MG capsule   Oral   Take 300 mg by mouth daily.         [name] glucose blood (ACCU-CHEK SMARTVIEW) test strip      Use as instructed, check blood sugars 3 times per day   100 each   12   . HYDROcodone-acetaminophen (NORCO/VICODIN) 5-325 MG per tablet   Oral   Take 1 tablet by mouth every 4 (four) hours as needed for pain.   15 tablet   0   . hydrOXYzine (ATARAX/VISTARIL) 25 MG tablet   Oral   Take 25-50 mg by mouth every 6 (six) hours as needed. For itching         . insulin aspart (NOVOLOG) 100 UNIT/ML injection   Subcutaneous   Inject 20-26 Units into the skin 3 (three) times daily before meals. 20 units with  breakfast, 23 units lunch, 26 units dinner         . Insulin Glargine (LANTUS SOLOSTAR) 100 UNIT/ML SOPN   Subcutaneous   Inject 60 Units into the skin 2 (two) times daily. Take 60 units in the am and 16 units at bedtime   5 pen   6   . insulin regular human CONCENTRATED (HUMULIN R) 500 UNIT/ML SOLN injection      12 units at breakfast, 9 units at lunch and 12 units at supper   20 mL   3   . Liraglutide (VICTOZA) 18 MG/3ML SOLN   Subcutaneous   Inject 1.8 mg into the skin at bedtime.          [name] losartan-hydrochlorothiazide (HYZAAR) 100-25 MG per tablet   Oral   Take 1 tablet by mouth daily.         . Multiple Vitamin (MULITIVITAMIN WITH MINERALS) TABS   Oral   Take 1 tablet by mouth daily.         . naproxen (NAPROSYN) 500 MG tablet   Oral   Take 500 mg by mouth 2 (two) times daily with a meal. For pain         . pioglitazone (ACTOS) 15 MG tablet      15 mg.         . pravastatin (PRAVACHOL) 40 MG tablet   Oral   Take 40 mg by mouth daily.           BP 159/90  Pulse 117  Temp(Src) 98.4 F (36.9 C) (Oral)  Resp 16  Wt 173 lb (78.472 kg)  BMI 29.68 kg/m2  SpO2 97% Physical Exam  Vitals reviewed. Constitutional: She is  oriented to person, place, and time. She appears well-developed and  well-nourished. No distress.  HENT:  Right Ear: External ear normal.  Left Ear: External ear normal.  Mouth/Throat: No oropharyngeal exudate.  Eyes: Conjunctivae and EOM are normal. Pupils are equal, round, and reactive to light.  Neck: Normal range of motion. Neck supple.  Cardiovascular: Regular rhythm, normal heart sounds and intact distal pulses.  Tachycardia present.  Exam reveals no gallop and no friction rub.   No murmur heard. Pulmonary/Chest: Effort normal and breath sounds normal.  Abdominal: Soft. Bowel sounds are normal. She exhibits no distension. There is no tenderness.  Musculoskeletal: Normal range of motion. She exhibits no edema.  Neurological: She is alert and oriented to person, place, and time. She has normal strength. No cranial nerve deficit or sensory deficit. Gait normal.  Cerebellar movements intact including heel-to-shin, finger-to-nose and rapid alternating movements.  No truncal or gait ataxia. No lightheadedness with ambulating  Skin: Skin is warm and dry. No rash noted. She is not diaphoretic.  Psychiatric: She has a normal mood and affect.    ED Course  Procedures (including critical care time) Labs Review Labs Reviewed  CBC WITH DIFFERENTIAL - Abnormal; Notable for the following:    RBC 5.30 (*)    Hemoglobin 16.6 (*)    HCT 46.6 (*)    All other components within normal limits  COMPREHENSIVE METABOLIC PANEL - Abnormal; Notable for the following:    Potassium 3.2 (*)    Glucose, Bld 203 (*)    Creatinine, Ser 1.12 (*)    GFR calc non Af Amer 52 (*)    GFR calc Af Amer 61 (*)    All other components within normal limits  URINALYSIS, ROUTINE W REFLEX MICROSCOPIC - Abnormal; Notable for the following:    Color, Urine ORANGE (*)    APPearance TURBID (*)    Specific Gravity, Urine 1.031 (*)    Glucose, UA 100 (*)    Hgb urine dipstick TRACE (*)    Bilirubin Urine MODERATE (*)    Ketones, ur 15 (*)    Protein, ur >300 (*)    Nitrite POSITIVE (*)     Leukocytes, UA MODERATE (*)    All other components within normal limits  URINE MICROSCOPIC-ADD ON - Abnormal; Notable for the following:    Squamous Epithelial / LPF MANY (*)    Bacteria, UA MANY (*)    Casts HYALINE CASTS (*)    All other components within normal limits  URINALYSIS, ROUTINE W REFLEX MICROSCOPIC - Abnormal; Notable for the following:    Color, Urine ORANGE (*)    Glucose, UA 100 (*)    Bilirubin Urine SMALL (*)    Ketones, ur 40 (*)    Protein, ur 100 (*)    Nitrite POSITIVE (*)    Leukocytes, UA MODERATE (*)    All other components within normal limits  URINE MICROSCOPIC-ADD ON - Abnormal; Notable for the following:    Squamous Epithelial / LPF FEW (*)    Casts HYALINE CASTS (*)    All other components within normal limits  URINE CULTURE  URINE CULTURE  POCT I-STAT TROPONIN I   Imaging Review No results found.   Date: 05/03/2013  Rate: 132  Rhythm: sinus tachycardia  QRS Axis: normal  Intervals: normal  ST/T Wave abnormalities: normal  Conduction Disutrbances:none  Narrative Interpretation: NSR, poor R wave progression, LVH by voltage criteria  Old EKG Reviewed: rate has increased by 30 BPM from  EKG 06/12/12, otherwise no significant change    MDM   62 year old female with PMH of DM, HTN and FM here with complaint of elevated blood pressure and a headache.  +mild dysuria on ROS.  All of her symptoms are improving and her BP has improved, as well.  Exam only remarkable for mild tachycardia.  Lungs clear.  No murmurs or edema.  Normal neuro exam.  CBC, CMP sent from triage.  UA given dysuria.  EKG given tachycardia.  No signs of hypertensive crisis at this time.  Will monitor.  6:27 PM Urine studies are contaminated. Catheterized specimen ordered.  Heart rate has improved into the 80s. Blood pressure has also improved with current systolic in the 150s and diastolic in the 80s.  Pt still reports feeling well.  8:15 PM  Pt was given potassium for hypoK  and had emesis afterwards.  She was given zofran and then was able to tolerate a sandwich.  She has otherwise remained well appearing and reports feeling well.  Cath UA c/w UTI.  BP remains improved.  It is felt the pt is stable for discharge with PCP f/u.  Return precautions reviewed.  Rx's for potassium and keflex.  Clinical Impression: 1. Hypertension   2. Headache   3. UTI (urinary tract infection)   4. Hypokalemia     Disposition: Discharge  Condition: Good  I have discussed the results, Dx and Tx plan. They expressed understanding and agree with the plan and were told to return to ED with any worsening of condition or concern.    Discharge Medication List as of 05/02/2013  8:02 PM    START taking these medications   Details  cephALEXin (KEFLEX) 500 MG capsule Take 1 capsule (500 mg total) by mouth 4 (four) times daily., Starting 05/02/2013, Until Discontinued, Print    potassium chloride SA (K-DUR,KLOR-CON) 20 MEQ tablet Take 1 tablet (20 mEq total) by mouth daily., Starting 05/02/2013, Until Discontinued, Print        Follow Up: Renaye Rakers, MD [address] [place] [address] [phone]  Schedule an appointment as soon as possible for a visit    Pt seen in conjunction with Dr. Ethelda Chick.  Reine Just. Beverely Pace, MD Emergency Medicine PGY-III [phone]     Oleh Genin, MD 05/03/13 1230

## 2013-05-02 NOTE — ED Notes (Signed)
The pt is c/o high bp  For several days she also has a headache.  Alert oriented skin warm and dry ambulatory  To the treatment room

## 2013-05-02 NOTE — ED Notes (Signed)
The pts bp is lower.  She has no complaints.  Iv started for a fluid bolus

## 2013-05-02 NOTE — ED Notes (Signed)
The pt vomited back her pot pills and  An approx of liquid.  zofran given and a sandwich.   She is a diabetic and has not eaten since she arrived she thinks she waited too long to eat

## 2013-05-02 NOTE — ED Notes (Addendum)
Pt in c/o headache over the last two days with left earache, states she thought her BP was elevated, it was elevated last night when she checked it at the pharmacy, also states she has vomited x4 times today, pt with history of migraines, states she has also had "floaters" today which is typical for her migraines, pt denies chest pain

## 2013-05-04 LAB — URINE CULTURE
Colony Count: >=100000
Colony Count: NO GROWTH
Culture: NO GROWTH
Special Requests: NORMAL

## 2013-05-04 NOTE — ED Provider Notes (Signed)
I have personally seen and examined the patient.  I have discussed the plan of care with the resident.  I have reviewed the documentation on PMH/FH/Soc. History.  I have reviewed the documentation of the resident and agree.  Doug Sou, MD 05/04/13 [phone]

## 2013-05-14 ENCOUNTER — Other Ambulatory Visit (INDEPENDENT_AMBULATORY_CARE_PROVIDER_SITE_OTHER): Payer: PRIVATE HEALTH INSURANCE

## 2013-05-14 DIAGNOSIS — IMO0001 Reserved for inherently not codable concepts without codable children: Secondary | ICD-10-CM

## 2013-05-14 LAB — BASIC METABOLIC PANEL
CO2: 27 mEq/L (ref 19–32)
Calcium: 8.8 mg/dL (ref 8.4–10.5)
Creatinine, Ser: 1.2 mg/dL (ref 0.4–1.2)
Glucose, Bld: 243 mg/dL — ABNORMAL HIGH (ref 70–99)

## 2013-05-14 LAB — LIPID PANEL
HDL: 44.5 mg/dL (ref >39.00)
Triglycerides: 107 mg/dL (ref 0.0–149.0)
VLDL: 21.4 mg/dL (ref 0.0–40.0)

## 2013-05-14 LAB — FRUCTOSAMINE: Fructosamine: 355 umol/L — ABNORMAL HIGH (ref <285)

## 2013-05-19 ENCOUNTER — Encounter: Payer: PRIVATE HEALTH INSURANCE | Attending: Endocrinology | Admitting: Nutrition

## 2013-05-19 ENCOUNTER — Encounter: Payer: Self-pay | Admitting: Nutrition

## 2013-05-19 ENCOUNTER — Encounter: Payer: Self-pay | Admitting: Endocrinology

## 2013-05-19 ENCOUNTER — Ambulatory Visit (INDEPENDENT_AMBULATORY_CARE_PROVIDER_SITE_OTHER): Payer: PRIVATE HEALTH INSURANCE | Admitting: Endocrinology

## 2013-05-19 VITALS — BP 140/68 | HR 92 | Temp 97.7°F | Wt 171.0 lb

## 2013-05-19 DIAGNOSIS — E119 Type 2 diabetes mellitus without complications: Secondary | ICD-10-CM

## 2013-05-19 DIAGNOSIS — Z713 Dietary counseling and surveillance: Secondary | ICD-10-CM | POA: Insufficient documentation

## 2013-05-19 DIAGNOSIS — IMO0001 Reserved for inherently not codable concepts without codable children: Secondary | ICD-10-CM

## 2013-05-19 DIAGNOSIS — I10 Essential (primary) hypertension: Secondary | ICD-10-CM

## 2013-05-19 NOTE — Progress Notes (Signed)
Pt.was given information on insulin pumps. We discussed the requirements and cost of being on a pump.  She has agreed to test her blood sugars 4X/day and to learn to carb count.  She says that she has been to the classes at [facility], but would like a refresher class on this.  She reports that she wants something easy to use.  She was given brochures on the [employer] and [place] pumps.  We discussed the differences and she reported good understanding of this.  She was told to read over all information given, and to call if questions.

## 2013-05-19 NOTE — Progress Notes (Signed)
Patient ID: [name], female   DOB: 11/14/1950, 62 y.o.   MRN: [id number]  Reason for Appointment: Diabetes follow-up   History of Present Illness    Diagnosis: date of diagnosis:1985.    PAST history: She has had difficult to control diabetes requiring large doses of insulin especially at meal times. Because of poor control she was switched from NovoLog to U-500 insulin in April. Initially seemed to have better blood sugars. Also because of no clear response with Invokana this was stopped and she was started on Actos. Her insulin was adjusted with higher mealtime coverage at breakfast and lunch and lower amounts at supper. Lantus has been unchanged. She  has been given Victoza and Invokana later in an attempt to improve her sugars but with only limited success. Appear to have done better with this dose only initially.   RECENT history:  Her blood sugars are still not improved and averaging 237 last 2 weeks She does not appear to be taking as much U-500 insulin as before and not clear why she has reduced it. She will take a little more when her blood sugars are higher Most of her high readings are in the afternoons and evenings and only sometimes good in the mornings. However blood sugars are quite variable especially at bedtime. Still has low normal readings in the mornings at times. No symptoms of hypoglycemia She is fairly compliant with diet and exercise and timing of the insulin  She had lost 3 pounds since her last visit  Complications: No retinopathy or known nephropathy.  Side effects from medications: None.  Insulin delivery device: Pens.  Monitors blood glucose: Three times a day.  Glucometer: Accucheck Aviva. Blood Glucose readings: am 54-302 with average about 160, early afternoon 172-390 with average 240. Around 7-8 PM average 271 with a range 148-347 and bedtime 84-505. Overall average 237  Hypoglycemic awareness: Rarely unaware of hypoglycemia, Usually develops  symptoms when blood glucose is less than 100.  Meals: 3 meals per day, at 8 am, 1 pm and 6 pm Carbohydrate intake: limited amounts.  Food preferences: She has 2 toast and boiled eggs for breakfast;Breakfast egg and toast, Eng Muffin no cereal, usually has a sandwich and salad for lunch and vegetables for dinner with fish or lean meat for protein. Will snack on apples, raisons or wheat thins.   Physical activity: exercise: 60 min, 5/7 days a week.  Dietician visit: Most recent: 2012  Retinal exam: Most recent: 2010.    The last HbgA1c was 9%in 4/14 , previously 8.5 in 9/13 and 7.2 in 12/13, and the fructosamine was 326.  Lab Results  Component Value Date   HGBA1C 10.3* 03/16/2013    The insulin regimen is described as Lantus 60-16, U-500 12-8-7 before meals HYPERTENSION:  home blood pressure has been about 158/80-90   Appointment on 05/14/2013  Component Date Value Range Status  . Fructosamine 05/14/2013 355* <285 umol/L Final   Comment:                            Variations in levels of serum proteins (albumin and immunoglobulins)                          may affect fructosamine results.                             [name]  Cholesterol 05/14/2013 170  0 - 200 mg/dL Final   ATP III Classification       Desirable:  < 200 mg/dL               Borderline High:  200 - 239 mg/dL          High:  > = 478 mg/dL  . Triglycerides 05/14/2013 107.0  0.0 - 149.0 mg/dL Final   Normal:  <295 mg/dLBorderline High:  150 - 199 mg/dL  . HDL 05/14/2013 44.50  >39.00 mg/dL Final  . VLDL 62/13/0865 21.4  0.0 - 40.0 mg/dL Final  . LDL Cholesterol 05/14/2013 104* 0 - 99 mg/dL Final  . Total CHOL/HDL Ratio 05/14/2013 4   Final                  Men          Women1/2 Average Risk     3.4          3.3Average Risk          5.0          4.42X Average Risk          9.6          7.13X Average Risk          15.0          11.0                      . Sodium 05/14/2013 137  135 - 145 mEq/L Final  . Potassium 05/14/2013 3.9   3.5 - 5.1 mEq/L Final  . Chloride 05/14/2013 100  96 - 112 mEq/L Final  . CO2 05/14/2013 27  19 - 32 mEq/L Final  . Glucose, Bld 05/14/2013 243* 70 - 99 mg/dL Final  . BUN 78/46/9629 13  6 - 23 mg/dL Final  . Creatinine, Ser 05/14/2013 1.2  0.4 - 1.2 mg/dL Final  . Calcium 52/84/1324 8.8  8.4 - 10.5 mg/dL Final  . GFR 40/05/2724 61.72  >60.00 mL/min Final      Medication List       This list is accurate as of: 05/19/13  8:56 AM.  Always use your most recent med list.               ACCU-CHEK SMARTVIEW test strip  Generic drug:  glucose blood     albuterol 108 (90 BASE) MCG/ACT inhaler  Commonly known as:  PROVENTIL HFA;VENTOLIN HFA  Inhale 2 puffs into the lungs every 6 (six) hours as needed. For shortness of breath     amitriptyline 25 MG tablet  Commonly known as:  ELAVIL  Take 25 mg by mouth at bedtime.     amLODipine 10 MG tablet  Commonly known as:  NORVASC  Take 10 mg by mouth daily.     aspirin 81 MG chewable tablet  Chew 81 mg by mouth daily.     cephALEXin 500 MG capsule  Commonly known as:  KEFLEX  Take 1 capsule (500 mg total) by mouth 4 (four) times daily.     cetirizine 10 MG tablet  Commonly known as:  ZYRTEC  Take 10 mg by mouth daily.     colchicine 0.6 MG tablet  Take 0.6 mg by mouth 2 (two) times daily.     cyclobenzaprine 10 MG tablet  Commonly known as:  FLEXERIL  Take 10 mg by mouth 3 (three) times daily as needed for muscle spasms.  DEXILANT 60 MG capsule  Generic drug:  dexlansoprazole  Take 60 mg by mouth daily.     Fluticasone-Salmeterol 250-50 MCG/DOSE Aepb  Commonly known as:  ADVAIR  Inhale 1 puff into the lungs 2 (two) times daily as needed. For shortness of breath     gabapentin 300 MG capsule  Commonly known as:  NEURONTIN  Take 300 mg by mouth 3 (three) times daily as needed (for pain).     hydrOXYzine 25 MG tablet  Commonly known as:  ATARAX/VISTARIL  Take 25-50 mg by mouth every 6 (six) hours as needed. For itching      Insulin Glargine 100 UNIT/ML Sopn  Commonly known as:  LANTUS SOLOSTAR  Inject 60 Units into the skin 2 (two) times daily. Take 60 units in the am and 16 units at bedtime     insulin regular human CONCENTRATED 500 UNIT/ML Soln injection  Commonly known as:  HUMULIN R  12 units at breakfast, 9 units at lunch and 12 units at supper     INVOKANA 300 MG Tabs  Generic drug:  Canagliflozin  Take 300 mg by mouth daily.     losartan-hydrochlorothiazide 100-25 MG per tablet  Commonly known as:  HYZAAR  Take 1 tablet by mouth daily.     multivitamin with minerals Tabs tablet  Take 1 tablet by mouth daily.     potassium chloride SA 20 MEQ tablet  Commonly known as:  K-DUR,KLOR-CON  Take 1 tablet (20 mEq total) by mouth daily.     pravastatin 40 MG tablet  Commonly known as:  PRAVACHOL  Take 40 mg by mouth daily.     spironolactone 25 MG tablet  Commonly known as:  ALDACTONE  Take 25 mg by mouth 2 (two) times daily.     VICTOZA 18 MG/3ML Soln injection  Generic drug:  Liraglutide  Inject 1.8 mg into the skin every morning.        Allergies:  Allergies  Allergen Reactions  . Codeine Itching and Nausea And Vomiting    Past Medical History  Diagnosis Date  . Diabetes mellitus   . Hypertension   . Arthritis   . Fibromyalgia     Past Surgical History  Procedure Laterality Date  . Cholecystectomy    . Colon surgery    . Esophagus dilated    . Arthroscopic knee surgery      Family History  Problem Relation Age of Onset  . Diabetes Mother   . Hypertension Mother   . Cancer Mother   . Diabetes Father   . Hypertension Father   . Diabetes Brother     Social History:  reports that she has never smoked. She has never used smokeless tobacco. She reports that she does not drink alcohol or use illicit drugs.  Review of Systems - Cardiovascular ROS: No history of CAD Blood pressure is better now and is using multiple drugs No history of recent pedal edema No  history of burning or tingling in her feet   Examination:   BP 140/68  Pulse 92  Temp(Src) 97.7 F (36.5 C) (Oral)  Wt 171 lb (77.565 kg)  BMI 29.34 kg/m2  SpO2 94%  Body mass index is 29.34 kg/(m^2).   No pedal edema  Assesment:    Diabetes type 2, uncontrolled - 250.02  Her blood sugars are still not improved and averaging 237 last 2 weeks Most of her high readings are in the afternoons and evenings and only sometimes good in the  mornings. However blood sugars are quite variable especially at bedtime. Although she had benefited from the U-500 insulin initially she appears to be needing larger doses now and variable response. She is fairly compliant with diet and exercise and timing of the insulin  Her A1c has gone up because of higher readings over the last 2-3 months  She had lost 3 pounds with continuing exercise and Invokana No hypoglycemia  PLAN:  Increase the dose of Lantus and U-500 insulin but reduce suppertime dose of Lantus Discussed how insulin pump works and benefits. She will discuss an insulin pump in detail with nurse educator today and will get her insurance authorization for this. May need C-peptide level evaluated also and will try to get her insurance approval This may be her best option considering her poor control and need for large doses of insulin  Continue Victoza and Invokana  Counseling time over 50% of today's 25 minute visit   Darreld Hoffer 05/19/2013, 8:56 AM

## 2013-05-19 NOTE — Patient Instructions (Addendum)
1. Read over pump brochures and decide on what insulin pump you wish.  2. Fill out information on forms in back of brochure,  and return them to the office.   3. Attend carb counting class

## 2013-05-19 NOTE — Patient Instructions (Addendum)
Lantus 65 in am and 10 in pm U-500 insulin in the morning 15, lunch 13-15 at  Lunch and 8-11 at supper

## 2013-05-22 ENCOUNTER — Telehealth: Payer: Self-pay | Admitting: *Deleted

## 2013-05-22 NOTE — Telephone Encounter (Signed)
Please remind Bonita Quin to start the paperwork. However the C-peptide level is not low and not clear if the pump will be approved, check with Bonita Quin. Let patient know that Bonita Quin should call next week

## 2013-05-22 NOTE — Telephone Encounter (Signed)
Pt was calling about getting on the insulin pump, she spoke with Bonita Quin on the 23rd and she isn't sure if she needs to make these appts or if someone should be calling her about setting it up?

## 2013-05-25 ENCOUNTER — Telehealth: Payer: Self-pay | Admitting: *Deleted

## 2013-05-25 NOTE — Telephone Encounter (Signed)
Noted, pt is aware 

## 2013-05-26 NOTE — Telephone Encounter (Signed)
Please initiate contact with [employer] with the pump

## 2013-05-29 ENCOUNTER — Other Ambulatory Visit: Payer: Self-pay | Admitting: *Deleted

## 2013-05-29 DIAGNOSIS — E119 Type 2 diabetes mellitus without complications: Secondary | ICD-10-CM

## 2013-06-01 ENCOUNTER — Other Ambulatory Visit: Payer: Self-pay | Admitting: Endocrinology

## 2013-06-01 ENCOUNTER — Other Ambulatory Visit: Payer: PRIVATE HEALTH INSURANCE

## 2013-06-01 LAB — GLUCOSE, RANDOM: Glucose, Bld: 183 mg/dL — ABNORMAL HIGH (ref 70–99)

## 2013-06-10 ENCOUNTER — Telehealth: Payer: Self-pay | Admitting: Endocrinology

## 2013-06-30 ENCOUNTER — Ambulatory Visit: Payer: PRIVATE HEALTH INSURANCE | Admitting: *Deleted

## 2013-07-03 ENCOUNTER — Encounter: Payer: Self-pay | Admitting: Endocrinology

## 2013-07-17 ENCOUNTER — Encounter: Payer: PRIVATE HEALTH INSURANCE | Attending: Endocrinology | Admitting: *Deleted

## 2013-07-17 ENCOUNTER — Encounter: Payer: Self-pay | Admitting: *Deleted

## 2013-07-17 VITALS — Ht 64.0 in | Wt 172.7 lb

## 2013-07-17 DIAGNOSIS — E119 Type 2 diabetes mellitus without complications: Secondary | ICD-10-CM | POA: Insufficient documentation

## 2013-07-17 DIAGNOSIS — Z713 Dietary counseling and surveillance: Secondary | ICD-10-CM | POA: Insufficient documentation

## 2013-07-17 NOTE — Progress Notes (Signed)
Medical Nutrition Therapy:  Appt start time: 0915 end time:  1015.  Assessment:  Patient here today for carb counting education for diabetes. She has previously been to the Diabetes Core classes, and recently met with RN regarding information on insulin pumps. She has made many healthy changes to her diet since going to class, including eliminating sweets, sweetened drinks, and fried foods. She does exhibit good knowledge retention regarding basic carb counting and portion control. However, she is concerned about correctly doing so with an insulin pump. Despite a diet moderate in carbs, her blood glucose remains elevated. Her glucose generally declines 2 hours after meals, but is elevated again before lunch and dinner. She does exercise regularly, and this seems to help glucose levels. Per report, carb intake at meals is around 45 g breakfast, and 20-30 g lunch/dinner. HgbA1c in July 2014 was 10.3. She also reports losing about 30 pounds since taking diabetes classes.   Fasting glucose: 170-180s After breakfast: 180-190s Before lunch: 250 After lunch: 180-190s Before dinner: >300 After dinner: >300  MEDICATIONS: Victoza, Lantus 60 units am, 16 units pm, Humulin R 12 units breakfast/dinner, 9 units lunch   DIETARY INTAKE:   Usual eating pattern includes 3 meals and 0-1 snacks per day.  24-hr recall:  B ( AM): Egg muffin, 1/2 slice cantaloupe  Snk ( AM): None  L ( PM): Baked chicken, 2 vegetables (some starchy, some non-starchy) Snk ( PM): Special K bar D ( PM): Same as lunch Snk ( PM): None Beverages: Water, Gatorade when exercising  Usual physical activity: Gym 3 days weekly for 1.5-2 hours, water aerobics, elliptical, stairmaster, weights  Estimated energy needs: 1500 calories 188 g carbohydrates 94 g protein 42 g fat  Progress Towards Goal(s):  In progress.   Nutritional Diagnosis:  NB-1.1 Food and nutrition-related knowledge deficit As related to carb counting.  As evidenced by  elevated blood glucose.    Intervention:  Nutrition counseling. We discussed advanced carb counting for insulin pump, including reviewing foods with carbs, carb grams per serving of foods, label reading, portion size, and meal planning.   Goals:  1. 30-45 grams of carbs per meal, 15 grams per snack.  2. Consider spreading out exercise to 1 hour 5 days weekly to maximize benefits on blood glucose.  3. If needed to prevent hypoglycemia, eat 15 gram carb snack prior to exercising.  4. Keep a record of carb intake and blood glucose at each meal.  Handouts given during visit include:  Meal plan card  Example food/glucose log  Monitoring/Evaluation:  Dietary intake, exercise, blood gluocse, and body weight in 6 week(s).

## 2013-07-20 ENCOUNTER — Encounter: Payer: Self-pay | Admitting: Endocrinology

## 2013-07-20 ENCOUNTER — Telehealth: Payer: Self-pay | Admitting: Nutrition

## 2013-07-20 ENCOUNTER — Ambulatory Visit (INDEPENDENT_AMBULATORY_CARE_PROVIDER_SITE_OTHER): Payer: PRIVATE HEALTH INSURANCE | Admitting: Endocrinology

## 2013-07-20 VITALS — BP 124/70 | HR 100 | Temp 98.2°F | Resp 12 | Ht 64.0 in | Wt 168.9 lb

## 2013-07-20 DIAGNOSIS — I10 Essential (primary) hypertension: Secondary | ICD-10-CM

## 2013-07-20 DIAGNOSIS — E1165 Type 2 diabetes mellitus with hyperglycemia: Secondary | ICD-10-CM | POA: Insufficient documentation

## 2013-07-20 DIAGNOSIS — E119 Type 2 diabetes mellitus without complications: Secondary | ICD-10-CM | POA: Insufficient documentation

## 2013-07-20 NOTE — Patient Instructions (Signed)
Lantus 70,  U-500: 17-15-5 units 30 min before meals

## 2013-07-20 NOTE — Progress Notes (Signed)
Patient ID: [name], female   DOB: 03-May-1951, 62 y.o.   MRN: [id number]  Reason for Appointment: Diabetes follow-up   History of Present Illness   Diagnosis: Type 2, date of diagnosis:1985.    PAST history: She has had difficult to control diabetes requiring large doses of insulin especially at meal times. Because of poor control she was switched from NovoLog to U-500 insulin in April. At that time her A1c was 9%  Initially seemed to have better blood sugars. With no clear response with Invokana this was stopped and she was started on Actos. Her insulin was adjusted with higher mealtime coverage at breakfast and lunch and lower amounts at supper. Lantus has been unchanged. She  has been given Victoza and Invokana later in an attempt to improve her sugars but with only limited success.   RECENT history:   Her blood sugars are appearing somewhat worse and averaging 273 in last 2 weeks She is following her instructions for the insulin but her blood sugars are markedly increased midday and afternoon and relatively better late at night and early morning This is despite increasing her morning Lantus and stopping her evening Lantus dose She is not taking her U-500 insulin 30 minutes before eating in the morning but is doing this before lunch and supper Also recently she has not been getting her protein at breakfast and will have significant increase in postprandial reading in the morning She has seen the DIETITIAN recently and is learning carbohydrate counting as well as better balancing of her meals No hypoglycemia recently She had lost 4 pounds since her last visit, not clear this is from hyperglycemia but is continuing to take her Invokana and metformin She has finally been approved for her insulin pump after doing appeals  with her insurance company; had low normal C-peptide Hypoglycemic awareness: Usually develops symptoms when blood glucose is less than 100.    Complications: No  retinopathy or known nephropathy.  Side effects from medications: None.  Insulin delivery device: Pens.   Monitors blood glucose: Three times a day.  Glucometer: Accucheck Aviva. Blood Glucose readings: Morning 64-177 with only a few readings. PC breakfast up to 362 with average about 195,  afternoon/early evening average readings are between 268-303 Average readings around 6-8 PM = 370 and at bedtime 236. Highest blood sugar 546 Blood sugars are mostly high between 4-7 PM Overall average 273  Meals: 3 meals per day, at 8 am, 1 pm and 6 pm Carbohydrate intake: limited amounts.  Food preferences: She has oatmeal,  toast  occasionally with egg, usually has a sandwich and salad for lunch and vegetables for dinner with fish or lean meat for protein. Will snack on apples, raisons or wheat thins.   Wt Readings from Last 3 Encounters:  07/20/13 168 lb 14.4 oz (76.613 kg)  07/17/13 172 lb 11.2 oz (78.336 kg)  05/19/13 171 lb (77.565 kg)   Physical activity: exercise: 60 min, 5/7 days a week.  Dietician visit: Most recent: 2012  Retinal exam: Most recent: 2010.     Lab Results  Component Value Date   HGBA1C 10.3* 03/16/2013    The insulin regimen is described as Lantus 70, U-500 12-8-7 before meals HYPERTENSION:  home blood pressure has been about 158/80-90   No visits with results within 1 Week(s) from this visit. Latest known visit with results is:  Orders Only on 06/01/2013  Component Date Value Range Status  . Glucose, Bld 06/01/2013 183* 70 - 99  mg/dL Final  . C-Peptide 78/46/9629 1.50  0.80 - 3.90 ng/mL Final      Medication List       This list is accurate as of: 07/20/13  8:53 AM.  Always use your most recent med list.               ACCU-CHEK SMARTVIEW test strip  Generic drug:  glucose blood     albuterol 108 (90 BASE) MCG/ACT inhaler  Commonly known as:  PROVENTIL HFA;VENTOLIN HFA  Inhale 2 puffs into the lungs every 6 (six) hours as needed. For shortness of  breath     amitriptyline 25 MG tablet  Commonly known as:  ELAVIL  Take 25 mg by mouth at bedtime.     amLODipine 10 MG tablet  Commonly known as:  NORVASC  Take 10 mg by mouth daily.     aspirin 81 MG chewable tablet  Chew 81 mg by mouth daily.     cephALEXin 500 MG capsule  Commonly known as:  KEFLEX  Take 1 capsule (500 mg total) by mouth 4 (four) times daily.     cetirizine 10 MG tablet  Commonly known as:  ZYRTEC  Take 10 mg by mouth daily.     colchicine 0.6 MG tablet  Take 0.6 mg by mouth 2 (two) times daily.     cyclobenzaprine 10 MG tablet  Commonly known as:  FLEXERIL  Take 10 mg by mouth 3 (three) times daily as needed for muscle spasms.     DEXILANT 60 MG capsule  Generic drug:  dexlansoprazole  Take 60 mg by mouth daily.     EPIPEN 2-PAK 0.3 mg/0.3 mL Soaj injection  Generic drug:  EPINEPHrine     Fluticasone-Salmeterol 250-50 MCG/DOSE Aepb  Commonly known as:  ADVAIR  Inhale 1 puff into the lungs 2 (two) times daily as needed. For shortness of breath     gabapentin 300 MG capsule  Commonly known as:  NEURONTIN  Take 300 mg by mouth 3 (three) times daily as needed (for pain).     hydrOXYzine 25 MG tablet  Commonly known as:  ATARAX/VISTARIL  Take 25-50 mg by mouth every 6 (six) hours as needed. For itching     Insulin Glargine 100 UNIT/ML Sopn  Commonly known as:  LANTUS SOLOSTAR  Inject 60 Units into the skin 2 (two) times daily. Take 60 units in the am and 16 units at bedtime     insulin regular human CONCENTRATED 500 UNIT/ML Soln injection  Commonly known as:  HUMULIN R  12 units at breakfast, 9 units at lunch and 12 units at supper     INVOKANA 300 MG Tabs  Generic drug:  Canagliflozin  Take 300 mg by mouth daily.     losartan-hydrochlorothiazide 100-25 MG per tablet  Commonly known as:  HYZAAR  Take 1 tablet by mouth daily.     multivitamin with minerals Tabs tablet  Take 1 tablet by mouth daily.     potassium chloride SA 20 MEQ  tablet  Commonly known as:  K-DUR,KLOR-CON  Take 1 tablet (20 mEq total) by mouth daily.     pravastatin 40 MG tablet  Commonly known as:  PRAVACHOL  Take 40 mg by mouth daily.     spironolactone 25 MG tablet  Commonly known as:  ALDACTONE  Take 25 mg by mouth 2 (two) times daily.     VICTOZA 18 MG/3ML Soln injection  Generic drug:  Liraglutide  Inject 1.8 mg  into the skin every morning.        Allergies:  Allergies  Allergen Reactions  . Codeine Itching and Nausea And Vomiting    Past Medical History  Diagnosis Date  . Diabetes mellitus   . Hypertension   . Arthritis   . Fibromyalgia     Past Surgical History  Procedure Laterality Date  . Cholecystectomy    . Colon surgery    . Esophagus dilated    . Arthroscopic knee surgery      Family History  Problem Relation Age of Onset  . Diabetes Mother   . Hypertension Mother   . Cancer Mother   . Diabetes Father   . Hypertension Father   . Diabetes Brother     Social History:  reports that she has never smoked. She has never used smokeless tobacco. She reports that she does not drink alcohol or use illicit drugs.  Review of Systems    No history of CAD Blood pressure is better now and is using multiple drugs No history of recent pedal edema No history of burning or tingling in her feet   Examination:   BP 124/70  Pulse 100  Temp(Src) 98.2 F (36.8 C)  Resp 12  Ht 5\' 4"  (1.626 m)  Wt 168 lb 14.4 oz (76.613 kg)  BMI 28.98 kg/m2  SpO2 97%  Body mass index is 28.98 kg/(m^2).   No pedal edema  Assesment:    Diabetes type 2, uncontrolled - 250.02  Her blood sugars are still not improved and averaging 273 in the last 2 weeks Most of her high readings are in the afternoons and evenings and usually with good in the mornings. However blood sugars are quite variable especially in the early afternoon. Although she had benefited from the U-500 insulin initially she appears to be needing larger doses now and  variable response. She is fairly compliant with diet and exercise and timing of the insulin She had lost 3 pounds with continuing exercise and Invokana No hypoglycemia  PLAN:  Increase the dose of  U-500 insulin at breakfast and lunch and continue Lantus in the morning only New insulin doses will be 17 at breakfast, 15 at lunch and 6 at dinner  Scheduled for starting the insulin pump next week. Discussed how this would be done and the implications as well as type of insulin to be used Because of her large insulin requirement of over 200 units a day will benefit from staying on the U-500 insulin for now She will need to bolus at least 30 minutes before her planned meals Discussed needing to add protein to breakfast more consistently She will be instructed in detail by the nurse educator, will use references she has more carbohydrate counting  To start with the following pump settings: Basal rate midnight = 0.2, 3 AM = 0.1, 7 AM = 0.4, 12 noon = 1.2, 6 PM = 0.8 and 10 PM = 0.4 Carbohydrate coverage 1:10 and correction factor 1: 30 with target 130  Continue Victoza and Invokana  Counseling time over 50% of today's 25 minute visit   [name] 07/20/2013, 8:53 AM

## 2013-07-28 ENCOUNTER — Other Ambulatory Visit: Payer: Self-pay | Admitting: *Deleted

## 2013-07-28 ENCOUNTER — Ambulatory Visit: Payer: PRIVATE HEALTH INSURANCE

## 2013-07-28 ENCOUNTER — Encounter: Payer: PRIVATE HEALTH INSURANCE | Attending: Endocrinology | Admitting: Nutrition

## 2013-07-28 DIAGNOSIS — E1065 Type 1 diabetes mellitus with hyperglycemia: Secondary | ICD-10-CM

## 2013-07-28 DIAGNOSIS — E119 Type 2 diabetes mellitus without complications: Secondary | ICD-10-CM | POA: Insufficient documentation

## 2013-07-28 DIAGNOSIS — Z713 Dietary counseling and surveillance: Secondary | ICD-10-CM | POA: Insufficient documentation

## 2013-07-28 MED ORDER — GLUCOSE BLOOD VI STRP: ORAL_STRIP | Status: DC

## 2013-07-29 ENCOUNTER — Encounter: Payer: Self-pay | Admitting: Endocrinology

## 2013-07-29 ENCOUNTER — Other Ambulatory Visit: Payer: Self-pay | Admitting: *Deleted

## 2013-07-29 ENCOUNTER — Encounter: Payer: PRIVATE HEALTH INSURANCE | Admitting: Nutrition

## 2013-07-29 ENCOUNTER — Ambulatory Visit (INDEPENDENT_AMBULATORY_CARE_PROVIDER_SITE_OTHER): Payer: PRIVATE HEALTH INSURANCE | Admitting: Endocrinology

## 2013-07-29 VITALS — BP 124/82 | HR 104 | Temp 98.3°F | Resp 12 | Ht 64.0 in | Wt 174.4 lb

## 2013-07-29 DIAGNOSIS — IMO0001 Reserved for inherently not codable concepts without codable children: Secondary | ICD-10-CM

## 2013-07-29 DIAGNOSIS — I10 Essential (primary) hypertension: Secondary | ICD-10-CM

## 2013-07-29 MED ORDER — GLUCAGON (RDNA) 1 MG IJ KIT: 1.0000 mg | PACK | Freq: Once | INTRAMUSCULAR | Status: DC | PRN

## 2013-07-29 NOTE — Progress Notes (Signed)
Phone Call last night at 9:30 PM revealed that pt. Was having many low blood sugars:  89 at 4PM, 91 at 4:30PM (after 20grams of carbs), 67acS, 57 1hrpcS. (8PM).  She has since drank 2 cups of juice with sugar in it.   She decreased her basal rate by 0.1u/hr (.5u/hr) and she changed her I/C We reviewed what we had discussed yesterday, and discussed duel wave bolusing, high blood sugar protocols, temp. Basal rates.  She was given a hand out on "high blood sugar protocols".  She had no final questions.

## 2013-07-29 NOTE — Patient Instructions (Signed)
Test blood sugars before meals, 2hr.after meals, bedtime, 3AM Give boluses before each meal using the bolus wizard.   Call if questions, or if blood sugars do not come down, or if drop low.

## 2013-07-29 NOTE — Progress Notes (Signed)
Pt. Was started on a [employer] Revel insulin pump.  She is using U500 R insulin.   Basal rates: MN: 0.2, 3AM: 0.1, 7AM: 0.4,  12Noon: 1.2, 6PM: 0.8, 10PM: 0.4,  Bolus Wizard: I/C ratio:10, target: 130-130, Sensitivity: 30, timing: 6hr. , Max Basal rate: 2.0, Max Bolus: 25u  Pt. Was shown how to fill a cartridge, and apply a Mio infusion set ( 23inch tubing).  She re demonstrated how to do this correctly.  We connected her Bayer meter to her pump and she tested her blood sugar correctly.  Her blood sugar before starting the pump was 387.  She did a correction bolus per Dr. Remus Blake order at 11:30AM.   We reviewed low blood sugar protocols, and she is using glucose tablets or 1/2 cup of orange juice, waiting 15 min., and retesting.   She reported no questions on how to carb count, saying that she understands this completely. She was told to test her blood sugars ac, 2hr. Pc, HS, and 3 AM.  She was given a sheet and shown how to fill it out, to record her blood sugars, carbs, and bolus amounts.   She had no final questions.  i will call her tonight, to see how she is doing.  She was told to call here, if blood sugars do not come down, or if her blood sugars drop low.  She agreed to do this.

## 2013-07-29 NOTE — Progress Notes (Signed)
Patient ID: [name], female   DOB: 20-Oct-1950, 62 y.o.   MRN: [id number]  Reason for Appointment: Diabetes follow-up   History of Present Illness   Diagnosis: Type 2, date of diagnosis:1985.    PAST history: [name] has had difficult to control diabetes requiring large doses of insulin especially at meal times. Because of poor control [name] was switched from NovoLog to U-500 insulin in 11/2012. At that time [name] A1c was 9%  Initially seemed to have better blood sugars. With no clear response with Invokana this was stopped and [name] was started on Actos. [name] insulin was adjusted with higher mealtime coverage at breakfast and lunch and lower amounts at supper. Lantus requirement appeared to be more in the morning and evening dose was eventually stopped. [name]  has been given Victoza and Invokana later in an attempt to improve [name] sugars but with only limited success.  Prior to starting insulin pump [name] blood sugar at home was averaging 273  RECENT history:   Because of continued poor control with subcutaneous insulin and high insulin doses [name] was switched to insulin pump which [name] started yesterday. Pump settings are: Basal rate midnight = 0.2, 3 AM = 0.1, 7 AM = 0.4, 12 noon = 1.2, 6 PM = 0.8 and 10 PM = 0.4 Carbohydrate coverage 1:10 and correction factor 1: 30 with target 130  [name] blood sugars are dramatically improved with starting the insulin pump and [name] has had mostly normal readings along with 2 episodes of hypoglycemia at 6 PM and 3 AM. This is despite [name] reducing the basal rate by 0.1 as advised by nurse educator in the evening  With a blood sugar of 58 at 6pm [name] had symptoms of nausea, feeling shaky, clammy. When glucose was 45 at 3am [name] had minimal symptoms of shakiness only  Monitors blood glucose: 4 times a day.  Glucometer: Contour  Blood Glucose readings: After lunch 84, 6 PM 58, p.c. supper 173, at bedtime 81, 3 AM = 45 and 6 AM = 97  Meals: 3 meals per day, at 8 am, 1 pm  and 6 pm Carbohydrate intake: limited amounts.  Food preferences: [name] has oatmeal,  toast  occasionally with egg, usually has a sandwich and salad for lunch and vegetables for dinner with fish or lean meat for protein. Will snack on apples, raisons or wheat thins.   Wt Readings from Last 3 Encounters:  07/29/13 174 lb 6.4 oz (79.107 kg)  07/20/13 168 lb 14.4 oz (76.613 kg)  07/17/13 172 lb 11.2 oz (78.336 kg)   Physical activity: exercise: 60 min, 5/7 days a week.  Dietician visit: Most recent: 2012  Retinal exam: Most recent: 2010.   Complications: No retinopathy or known nephropathy.    Lab Results  Component Value Date   HGBA1C 10.3* 03/16/2013    No visits with results within 1 Week(s) from this visit. Latest known visit with results is:  Orders Only on 06/01/2013  Component Date Value Range Status  . Glucose, Bld 06/01/2013 183* 70 - 99 mg/dL Final  . C-Peptide 13/03/6577 1.50  0.80 - 3.90 ng/mL Final      Medication List       This list is accurate as of: 07/29/13  9:06 AM.  Always use your most recent med list.               albuterol 108 (90 BASE) MCG/ACT inhaler  Commonly known as:  PROVENTIL HFA;VENTOLIN HFA  Inhale 2 puffs  into the lungs every 6 (six) hours as needed. For shortness of breath     amitriptyline 25 MG tablet  Commonly known as:  ELAVIL  Take 25 mg by mouth at bedtime.     amLODipine 10 MG tablet  Commonly known as:  NORVASC  Take 10 mg by mouth daily.     aspirin 81 MG chewable tablet  Chew 81 mg by mouth daily.     cephALEXin 500 MG capsule  Commonly known as:  KEFLEX  Take 1 capsule (500 mg total) by mouth 4 (four) times daily.     cetirizine 10 MG tablet  Commonly known as:  ZYRTEC  Take 10 mg by mouth daily.     colchicine 0.6 MG tablet  Take 0.6 mg by mouth 2 (two) times daily.     cyclobenzaprine 10 MG tablet  Commonly known as:  FLEXERIL  Take 10 mg by mouth 3 (three) times daily as needed for muscle spasms.      DEXILANT 60 MG capsule  Generic drug:  dexlansoprazole  Take 60 mg by mouth daily.     EPIPEN 2-PAK 0.3 mg/0.3 mL Soaj injection  Generic drug:  EPINEPHrine     Fluticasone-Salmeterol 250-50 MCG/DOSE Aepb  Commonly known as:  ADVAIR  Inhale 1 puff into the lungs 2 (two) times daily as needed. For shortness of breath     gabapentin 300 MG capsule  Commonly known as:  NEURONTIN  Take 300 mg by mouth 3 (three) times daily as needed (for pain).     glucose blood test strip  Commonly known as:  BAYER CONTOUR NEXT TEST  Use as instructed to check blood sugars 7 times per day     hydrOXYzine 25 MG tablet  Commonly known as:  ATARAX/VISTARIL  Take 25-50 mg by mouth every 6 (six) hours as needed. For itching     Insulin Glargine 100 UNIT/ML Sopn  Commonly known as:  LANTUS SOLOSTAR  Inject 60 Units into the skin 2 (two) times daily. Take 60 units in the am and 16 units at bedtime     insulin regular human CONCENTRATED 500 UNIT/ML Soln injection  Commonly known as:  HUMULIN R  12 units at breakfast, 9 units at lunch and 12 units at supper     INVOKANA 300 MG Tabs  Generic drug:  Canagliflozin  Take 300 mg by mouth daily.     losartan-hydrochlorothiazide 100-25 MG per tablet  Commonly known as:  HYZAAR  Take 1 tablet by mouth daily.     multivitamin with minerals Tabs tablet  Take 1 tablet by mouth daily.     potassium chloride SA 20 MEQ tablet  Commonly known as:  K-DUR,KLOR-CON  Take 1 tablet (20 mEq total) by mouth daily.     pravastatin 40 MG tablet  Commonly known as:  PRAVACHOL  Take 40 mg by mouth daily.     spironolactone 25 MG tablet  Commonly known as:  ALDACTONE  Take 25 mg by mouth 2 (two) times daily.     VICTOZA 18 MG/3ML Soln injection  Generic drug:  Liraglutide  Inject 1.8 mg into the skin every morning.        Allergies:  Allergies  Allergen Reactions  . Codeine Itching and Nausea And Vomiting    Past Medical History  Diagnosis Date  .  Diabetes mellitus   . Hypertension   . Arthritis   . Fibromyalgia     Past Surgical History  Procedure Laterality  Date  . Cholecystectomy    . Colon surgery    . Esophagus dilated    . Arthroscopic knee surgery      Family History  Problem Relation Age of Onset  . Diabetes Mother   . Hypertension Mother   . Cancer Mother   . Diabetes Father   . Hypertension Father   . Diabetes Brother     Social History:  reports that [name] has never smoked. [name] has never used smokeless tobacco. [name] reports that [name] does not drink alcohol or use illicit drugs.  Review of Systems   HYPERTENSION:  blood pressure is better controlled recently No history of recent pedal edema No history of burning or tingling in [name] feet   Examination:   BP 124/82  Pulse 104  Temp(Src) 98.3 F (36.8 C)  Resp 12  Ht 5\' 4"  (1.626 m)  Wt 174 lb 6.4 oz (79.107 kg)  BMI 29.92 kg/m2  SpO2 99%  Body mass index is 29.92 kg/(m^2).    Assesment:    Diabetes type 2, uncontrolled - 250.02  [name] blood sugars are markedly improved with using U-500 insulin in the insulin pump since yesterday [name] insulin requiremen appears to be markedly reduced compared to the multiple injection regimen which was requiring over 200 units a day [name] is also taking Invokana and Victoza See history of present illness for discussion on blood sugars since yesterday  PLAN:  Will need to reduce [name] insulin significantly because of tendency to low normal or low readings even with reduced amount of insulin with [name] pump. [name] will also reduced [name] coverage for meals and high readings We discussed checking  [name] blood sugars frequently and including overnight and [name] will call if blood sugars are unusually high or low later today. Discussed timing of boluses, insulin adjustment, pump management, hypoglycemia [name] was given further education by the CDE today  New basal rate: MN 0.1, 7 AM = 0.3, 12 noon = 0.7, 6 PM =0.5 and 9 PM =  0.2 Carbohydrate coverage 1:15 and correction factor 1: 45 with target 130  Counseling time over 50% of today's 25 minute visit   Hodge Stachnik 07/29/2013, 9:06 AM

## 2013-07-29 NOTE — Patient Instructions (Signed)
Basal MN 0.1, 7 AM = 0.3, 12 noon = 0.7, 6 PM =0.5 and 9 PM = 0.2 Carbohydrate coverage 1:15 and correction factor 1: 45 with target 130

## 2013-07-30 ENCOUNTER — Ambulatory Visit (INDEPENDENT_AMBULATORY_CARE_PROVIDER_SITE_OTHER): Payer: PRIVATE HEALTH INSURANCE | Admitting: Endocrinology

## 2013-07-30 ENCOUNTER — Encounter: Payer: PRIVATE HEALTH INSURANCE | Admitting: Nutrition

## 2013-07-30 NOTE — Progress Notes (Signed)
Patient ID: [name], female   DOB: 1950/10/04, 62 y.o.   MRN: [id number]  Reason for Appointment: Diabetes follow-up   History of Present Illness   Diagnosis: Type 2, date of diagnosis:1985.    PAST history: She has had difficult to control diabetes requiring large doses of insulin especially at meal times. Because of poor control she was switched from NovoLog to U-500 insulin in 11/2012. At that time her A1c was 9%  Initially seemed to have better blood sugars. With no clear response with Invokana this was stopped and she was started on Actos. Her insulin was adjusted with higher mealtime coverage at breakfast and lunch and lower amounts at supper. Lantus requirement appeared to be more in the morning and evening dose was eventually stopped. She  has been given Victoza and Invokana later in an attempt to improve her sugars but with only limited success.  Prior to starting insulin pump her blood sugar at home was averaging 273  RECENT history:   Because of continued poor control with subcutaneous insulin and high insulin doses she was switched to insulin pump which she started yesterday. Pump settings are: Basal rate midnight = 0.1, 7 AM = 0.3 12 noon = 0.7, 6 PM = 0.5 and 9 PM = 0.2 Carbohydrate coverage 1:15 and correction factor 1: 45 with target 130  Her blood sugars have been dramatically improved with starting the insulin pump Because of tendency to hypoglycemia her basal rates and carbohydrate coverage were reduced yesterday She still continues to have excellent readings She does appear to have relatively lower readings after meals and on waking up. Did not check her overnight reading last night and did not have any hypoglycemic symptoms at anytime  Monitors blood glucose: 4 times a day.  Glucometer: Contour  Blood Glucose readings: Fasting today 62, p.c. breakfast 79 Before lunch 173, After lunch 85, before supper 116 p.c. supper 72, at bedtime 105  Meals: 3 meals per  day, at 8 am, 1 pm and 6 pm Carbohydrate intake: limited amounts.  Food preferences: She has oatmeal,  toast  occasionally with egg, usually has a sandwich and salad for lunch and vegetables for dinner with fish or lean meat for protein. Will snack on apples, raisons or wheat thins.   Wt Readings from Last 3 Encounters:  07/29/13 174 lb 6.4 oz (79.107 kg)  07/20/13 168 lb 14.4 oz (76.613 kg)  07/17/13 172 lb 11.2 oz (78.336 kg)   Physical activity: exercise: 60 min, 5/7 days a week.  Dietician visit: Most recent: 2012  Retinal exam: Most recent: 2010.   Complications: No retinopathy or known nephropathy.    Lab Results  Component Value Date   HGBA1C 10.3* 03/16/2013    No visits with results within 1 Week(s) from this visit. Latest known visit with results is:  Orders Only on 06/01/2013  Component Date Value Range Status  . Glucose, Bld 06/01/2013 183* 70 - 99 mg/dL Final  . C-Peptide 21/30/8657 1.50  0.80 - 3.90 ng/mL Final      Medication List       This list is accurate as of: 07/30/13 12:37 PM.  Always use your most recent med list.               albuterol 108 (90 BASE) MCG/ACT inhaler  Commonly known as:  PROVENTIL HFA;VENTOLIN HFA  Inhale 2 puffs into the lungs every 6 (six) hours as needed. For shortness of breath     amitriptyline 25  MG tablet  Commonly known as:  ELAVIL  Take 25 mg by mouth at bedtime.     amLODipine 10 MG tablet  Commonly known as:  NORVASC  Take 10 mg by mouth daily.     aspirin 81 MG chewable tablet  Chew 81 mg by mouth daily.     cephALEXin 500 MG capsule  Commonly known as:  KEFLEX  Take 1 capsule (500 mg total) by mouth 4 (four) times daily.     cetirizine 10 MG tablet  Commonly known as:  ZYRTEC  Take 10 mg by mouth daily.     colchicine 0.6 MG tablet  Take 0.6 mg by mouth 2 (two) times daily.     cyclobenzaprine 10 MG tablet  Commonly known as:  FLEXERIL  Take 10 mg by mouth 3 (three) times daily as needed for muscle  spasms.     DEXILANT 60 MG capsule  Generic drug:  dexlansoprazole  Take 60 mg by mouth daily.     EPIPEN 2-PAK 0.3 mg/0.3 mL Soaj injection  Generic drug:  EPINEPHrine     Fluticasone-Salmeterol 250-50 MCG/DOSE Aepb  Commonly known as:  ADVAIR  Inhale 1 puff into the lungs 2 (two) times daily as needed. For shortness of breath     gabapentin 300 MG capsule  Commonly known as:  NEURONTIN  Take 300 mg by mouth 3 (three) times daily as needed (for pain).     glucagon 1 MG injection  Commonly known as:  GLUCAGON EMERGENCY  Inject 1 mg into the vein once as needed.     glucose blood test strip  Commonly known as:  BAYER CONTOUR NEXT TEST  Use as instructed to check blood sugars 7 times per day     hydrOXYzine 25 MG tablet  Commonly known as:  ATARAX/VISTARIL  Take 25-50 mg by mouth every 6 (six) hours as needed. For itching     Insulin Glargine 100 UNIT/ML Sopn  Commonly known as:  LANTUS SOLOSTAR  Inject 60 Units into the skin 2 (two) times daily. Take 60 units in the am and 16 units at bedtime     insulin regular human CONCENTRATED 500 UNIT/ML Soln injection  Commonly known as:  HUMULIN R  12 units at breakfast, 9 units at lunch and 12 units at supper     INVOKANA 300 MG Tabs  Generic drug:  Canagliflozin  Take 300 mg by mouth daily.     losartan-hydrochlorothiazide 100-25 MG per tablet  Commonly known as:  HYZAAR  Take 1 tablet by mouth daily.     multivitamin with minerals Tabs tablet  Take 1 tablet by mouth daily.     potassium chloride SA 20 MEQ tablet  Commonly known as:  K-DUR,KLOR-CON  Take 1 tablet (20 mEq total) by mouth daily.     pravastatin 40 MG tablet  Commonly known as:  PRAVACHOL  Take 40 mg by mouth daily.     spironolactone 25 MG tablet  Commonly known as:  ALDACTONE  Take 25 mg by mouth 2 (two) times daily.     VICTOZA 18 MG/3ML Soln injection  Generic drug:  Liraglutide  Inject 1.8 mg into the skin every morning.        Allergies:   Allergies  Allergen Reactions  . Codeine Itching and Nausea And Vomiting    Past Medical History  Diagnosis Date  . Diabetes mellitus   . Hypertension   . Arthritis   . Fibromyalgia  Past Surgical History  Procedure Laterality Date  . Cholecystectomy    . Colon surgery    . Esophagus dilated    . Arthroscopic knee surgery      Family History  Problem Relation Age of Onset  . Diabetes Mother   . Hypertension Mother   . Cancer Mother   . Diabetes Father   . Hypertension Father   . Diabetes Brother     Social History:  reports that she has never smoked. She has never used smokeless tobacco. She reports that she does not drink alcohol or use illicit drugs.  Review of Systems   HYPERTENSION:  blood pressure is better controlled recently No history of recent pedal edema No history of burning or tingling in her feet   Examination:   There were no vitals taken for this visit.  There is no weight on file to calculate BMI.    Assesment:    Diabetes type 2, uncontrolled - 250.02  Her blood sugars are markedly improved with using U-500 insulin in the insulin pump  Her insulin requiremen appears to be markedly reduced and her dosage was lowered yesterday again She is also taking Invokana and Victoza See history of present illness for discussion on blood sugars since yesterday  PLAN:  Will need to reduce her carbohydrate coverage since postprandial readings are lower Also would reduce her basal rate after 9 PM down to 0.1 to help with overnight low readings and she will check her glucose at 3 AM tonight She was given further education by the CDE today  New basal rate: MN 0.1, 7 AM = 0.3, 12 noon = 0.7, 6 PM =0.5 and 9 PM = 0.1 Carbohydrate coverage 1: 20 and correction factor 1: 45 with target 130  Counseling time over 50% of today's 25 minute visit   Aslan Montagna 07/30/2013, 12:37 PM

## 2013-07-30 NOTE — Patient Instructions (Signed)
Basal at 9 pm= 0.1 Check sugar at 3 am tonight  Carb coverage 1: 20

## 2013-07-31 NOTE — Patient Instructions (Signed)
1.  Continue to test blood sugars before, 2hr. After and at 3 AM for one more night.  2.  Call blood sugar readings to Dr. Remus Blake office tomorrow.

## 2013-07-31 NOTE — Progress Notes (Signed)
Pt. Did a cartridge/infusion set change with little assistance from me.  We review all topics again, and patient did not have any questions.  She signed off on understanding all topics and will call her blood sugar readings into Dr. Remus Blake office tomorrow.    Pt. Reported that she had watched the video again, and was feeling much more confident with using this pump.

## 2013-08-03 ENCOUNTER — Other Ambulatory Visit (INDEPENDENT_AMBULATORY_CARE_PROVIDER_SITE_OTHER): Payer: PRIVATE HEALTH INSURANCE

## 2013-08-03 DIAGNOSIS — E119 Type 2 diabetes mellitus without complications: Secondary | ICD-10-CM

## 2013-08-03 DIAGNOSIS — IMO0001 Reserved for inherently not codable concepts without codable children: Secondary | ICD-10-CM

## 2013-08-03 LAB — COMPREHENSIVE METABOLIC PANEL
Alkaline Phosphatase: 101 U/L (ref 39–117)
BUN: 12 mg/dL (ref 6–23)
CO2: 26 mEq/L (ref 19–32)
Creatinine, Ser: 1.2 mg/dL (ref 0.4–1.2)
GFR: 57.07 mL/min — ABNORMAL LOW (ref >60.00)
Glucose, Bld: 254 mg/dL — ABNORMAL HIGH (ref 70–99)
Potassium: 3.9 mEq/L (ref 3.5–5.1)
Sodium: 135 mEq/L (ref 135–145)
Total Bilirubin: 0.3 mg/dL (ref 0.3–1.2)
Total Protein: 7.7 g/dL (ref 6.0–8.3)

## 2013-08-03 LAB — LIPID PANEL
Cholesterol: 197 mg/dL (ref 0–200)
HDL: 51.6 mg/dL (ref >39.00)
LDL Cholesterol: 114 mg/dL — ABNORMAL HIGH (ref 0–99)
Triglycerides: 157 mg/dL — ABNORMAL HIGH (ref 0.0–149.0)
VLDL: 31.4 mg/dL (ref 0.0–40.0)

## 2013-08-03 LAB — GLUCOSE, RANDOM: Glucose, Bld: 254 mg/dL — ABNORMAL HIGH (ref 70–99)

## 2013-08-06 ENCOUNTER — Ambulatory Visit (INDEPENDENT_AMBULATORY_CARE_PROVIDER_SITE_OTHER): Payer: PRIVATE HEALTH INSURANCE | Admitting: Endocrinology

## 2013-08-06 ENCOUNTER — Encounter: Payer: Self-pay | Admitting: Endocrinology

## 2013-08-06 VITALS — BP 160/80 | HR 100 | Temp 98.3°F | Resp 12 | Ht 64.0 in | Wt 183.3 lb

## 2013-08-06 DIAGNOSIS — E785 Hyperlipidemia, unspecified: Secondary | ICD-10-CM

## 2013-08-06 NOTE — Progress Notes (Signed)
Patient ID: [name], female   DOB: May 29, 1951, 62 y.o.   MRN: [id number]  Reason for Appointment: Diabetes follow-up   History of Present Illness   Diagnosis: Type 2, date of diagnosis:1985.    PAST history: She has had difficult to control diabetes requiring large doses of insulin especially at meal times. Because of poor control she was switched from NovoLog to U-500 insulin in 11/2012. At that time her A1c was 9%  Initially seemed to have better blood sugars. With no clear response with Invokana this was stopped and she was started on Actos. Her insulin was adjusted with higher mealtime coverage at breakfast and lunch and lower amounts at supper. Lantus requirement appeared to be more in the morning and evening dose was eventually stopped. She  has been given Victoza and Invokana later in an attempt to improve her sugars but with only limited success.  Prior to starting insulin pump her blood sugar at home was averaging 273  RECENT history:   Because of continued poor control with subcutaneous insulin and large insulin requirement she was switched to a [employer] insulin pump which she started on 07/29/13 With this her blood sugars have overall improved significantly and she is requiring much less insulin She is continuing to use the U-500 insulin in the pump With this she is requiring most of her basal insulin between 12 noon and 9 PM This week however her blood sugars are inconsistent; on Sunday her sugars were practically normal all day but otherwise appears to have mild persistent hyperglycemia with relatively higher readings midmorning and evenings She thinks this is partly from her not exercising and also from the stress of family illness  GLUCOSE readings:  PREMEAL Breakfast Lunch Dinner Bedtime Overall  Glucose range: 86-122 113-218 122-219 80-261   Mean/median:     15 6   Pump settings are: Basal rate midnight = 0.1, 3 AM = 0.05.7: 30 AM = 0.3 12 noon = 0.7, 6 PM = 0.5  and 9 PM = 0.1 Carbohydrate coverage 1:20 and correction factor 1: 45 with target 130 BASAL total amount = 7.9 units, bolus percent use Recently 15-57%  Meals: 3 meals per day, at 8 am, 1 pm and 6 pm Carbohydrate intake: limited amounts.  Food preferences: She has oatmeal,  toast  occasionally with egg, usually has a sandwich and salad for lunch and vegetables for dinner with fish or lean meat for protein. Will snack on apples, raisons or wheat thins.   Wt Readings from Last 3 Encounters:  08/06/13 183 lb 4.8 oz (83.144 kg)  07/29/13 174 lb 6.4 oz (79.107 kg)  07/20/13 168 lb 14.4 oz (76.613 kg)   Physical activity: exercise:not for 10 days; 60 min, 5/7 days a week.  Dietician visit: Most recent: 2012  Retinal exam: Most recent: 2010.   Complications: No retinopathy or known nephropathy.    Lab Results  Component Value Date   HGBA1C 10.2* 08/03/2013    Appointment on 08/03/2013  Component Date Value Range Status  . Hemoglobin A1C 08/03/2013 10.2* 4.6 - 6.5 % Final   Glycemic Control Guidelines for People with Diabetes:Non Diabetic:  <6%Goal of Therapy: <7%Additional Action Suggested:  >8%   . Sodium 08/03/2013 135  135 - 145 mEq/L Final  . Potassium 08/03/2013 3.9  3.5 - 5.1 mEq/L Final  . Chloride 08/03/2013 99  96 - 112 mEq/L Final  . CO2 08/03/2013 26  19 - 32 mEq/L Final  . Glucose, Bld 08/03/2013 254* 70 -  99 mg/dL Final  . BUN 95/62/1308 12  6 - 23 mg/dL Final  . Creatinine, Ser 08/03/2013 1.2  0.4 - 1.2 mg/dL Final  . Total Bilirubin 08/03/2013 0.3  0.3 - 1.2 mg/dL Final  . Alkaline Phosphatase 08/03/2013 101  39 - 117 U/L Final  . AST 08/03/2013 31  0 - 37 U/L Final  . ALT 08/03/2013 27  0 - 35 U/L Final  . Total Protein 08/03/2013 7.7  6.0 - 8.3 g/dL Final  . Albumin 65/78/4696 3.7  3.5 - 5.2 g/dL Final  . Calcium 29/52/8413 9.3  8.4 - 10.5 mg/dL Final  . GFR 24/40/1027 57.07* >60.00 mL/min Final  . Cholesterol 08/03/2013 197  0 - 200 mg/dL Final   ATP III  Classification       Desirable:  < 200 mg/dL               Borderline High:  200 - 239 mg/dL          High:  > = 253 mg/dL  . Triglycerides 08/03/2013 157.0* 0.0 - 149.0 mg/dL Final   Normal:  <664 mg/dLBorderline High:  150 - 199 mg/dL  . HDL 08/03/2013 51.60  >39.00 mg/dL Final  . VLDL 40/34/7425 31.4  0.0 - 40.0 mg/dL Final  . LDL Cholesterol 08/03/2013 114* 0 - 99 mg/dL Final  . Total CHOL/HDL Ratio 08/03/2013 4   Final                  Men          Women1/2 Average Risk     3.4          3.3Average Risk          5.0          4.42X Average Risk          9.6          7.13X Average Risk          15.0          11.0                      . C-Peptide 08/03/2013 1.58  0.80 - 3.90 ng/mL Final  . Glucose, Bld 08/03/2013 254* 70 - 99 mg/dL Final      Medication List       This list is accurate as of: 08/06/13  2:00 PM.  Always use your most recent med list.               ACCU-CHEK FASTCLIX LANCETS Misc     albuterol 108 (90 BASE) MCG/ACT inhaler  Commonly known as:  PROVENTIL HFA;VENTOLIN HFA  Inhale 2 puffs into the lungs every 6 (six) hours as needed. For shortness of breath     amitriptyline 25 MG tablet  Commonly known as:  ELAVIL  Take 25 mg by mouth at bedtime.     amLODipine 10 MG tablet  Commonly known as:  NORVASC  Take 10 mg by mouth daily.     aspirin 81 MG chewable tablet  Chew 81 mg by mouth daily.     cephALEXin 500 MG capsule  Commonly known as:  KEFLEX  Take 1 capsule (500 mg total) by mouth 4 (four) times daily.     cetirizine 10 MG tablet  Commonly known as:  ZYRTEC  Take 10 mg by mouth daily.     colchicine 0.6 MG tablet  Take 0.6 mg by mouth 2 (two)  times daily.     cyclobenzaprine 10 MG tablet  Commonly known as:  FLEXERIL  Take 10 mg by mouth 3 (three) times daily as needed for muscle spasms.     DEXILANT 60 MG capsule  Generic drug:  dexlansoprazole  Take 60 mg by mouth daily.     EPIPEN 2-PAK 0.3 mg/0.3 mL Soaj injection  Generic drug:   EPINEPHrine     Fluticasone-Salmeterol 250-50 MCG/DOSE Aepb  Commonly known as:  ADVAIR  Inhale 1 puff into the lungs 2 (two) times daily as needed. For shortness of breath     gabapentin 300 MG capsule  Commonly known as:  NEURONTIN  Take 300 mg by mouth 3 (three) times daily as needed (for pain).     glucagon 1 MG injection  Commonly known as:  GLUCAGON EMERGENCY  Inject 1 mg into the vein once as needed.     glucose blood test strip  Commonly known as:  BAYER CONTOUR NEXT TEST  Use as instructed to check blood sugars 7 times per day     hydrOXYzine 25 MG tablet  Commonly known as:  ATARAX/VISTARIL  Take 25-50 mg by mouth every 6 (six) hours as needed. For itching     Insulin Glargine 100 UNIT/ML Sopn  Commonly known as:  LANTUS SOLOSTAR  Inject 60 Units into the skin 2 (two) times daily. Take 60 units in the am and 16 units at bedtime     insulin regular human CONCENTRATED 500 UNIT/ML Soln injection  Commonly known as:  HUMULIN R  12 units at breakfast, 9 units at lunch and 12 units at supper     INVOKANA 300 MG Tabs  Generic drug:  Canagliflozin  Take 300 mg by mouth daily.     losartan-hydrochlorothiazide 100-25 MG per tablet  Commonly known as:  HYZAAR  Take 1 tablet by mouth daily.     multivitamin with minerals Tabs tablet  Take 1 tablet by mouth daily.     potassium chloride SA 20 MEQ tablet  Commonly known as:  K-DUR,KLOR-CON  Take 1 tablet (20 mEq total) by mouth daily.     pravastatin 40 MG tablet  Commonly known as:  PRAVACHOL  Take 40 mg by mouth daily.     spironolactone 25 MG tablet  Commonly known as:  ALDACTONE  Take 25 mg by mouth 2 (two) times daily.     VICTOZA 18 MG/3ML Soln injection  Generic drug:  Liraglutide  Inject 1.8 mg into the skin every morning.        Allergies:  Allergies  Allergen Reactions  . Codeine Itching and Nausea And Vomiting    Past Medical History  Diagnosis Date  . Diabetes mellitus   . Hypertension   .  Arthritis   . Fibromyalgia     Past Surgical History  Procedure Laterality Date  . Cholecystectomy    . Colon surgery    . Esophagus dilated    . Arthroscopic knee surgery      Family History  Problem Relation Age of Onset  . Diabetes Mother   . Hypertension Mother   . Cancer Mother   . Diabetes Father   . Hypertension Father   . Diabetes Brother     Social History:  reports that she has never smoked. She has never used smokeless tobacco. She reports that she does not drink alcohol or use illicit drugs.  Review of Systems   She continues to gain weight recently and this may  be from much better glycemic control No recent ankle edema HYPERTENSION:  blood pressure is better controlled recently, higher today No history of recent pedal edema No history of burning or tingling in her feet   Examination:   BP 160/80  Pulse 100  Temp(Src) 98.3 F (36.8 C)  Resp 12  Ht 5\' 4"  (1.626 m)  Wt 183 lb 4.8 oz (83.144 kg)  BMI 31.45 kg/m2  SpO2 97%  Body mass index is 31.45 kg/(m^2).    Assesment:    Diabetes type 2, uncontrolled  Her blood sugars are overall improved with using U-500 insulin in the insulin pump She is having some inconsistent readings recently and this may be partly from her stress situation and not exercising as usual Most of her high readings appear to be midmorning and late evening with readings as high as 334 Also appears to be not getting enough insulin to correct her high readings on several occasions Postprandial sugars are somewhat variable especially in the evenings but generally eating small amount of carbohydrate and difficult to assess this  PLAN:  Will need to increase her basal rate before noon and after 6 PM for now She knows to do a temporary basal when she starts exercising and can start this 30 minutes before exercising She will call if blood sugars are not consistently controlled Consider switching to NovoLog insulin when she runs out of  her U-500 since her total daily insulin requirement is only about 55-80 units a day in terms of U-100 currently  Counseling time over 50% of today's 25 minute visit  Hazem Kenner 08/06/2013, 2:00 PM

## 2013-08-06 NOTE — Patient Instructions (Addendum)
Basal rate changes:0.4 at 7:30 am and 0.6 at 6 pm  Call if blood sugar consistently high or start getting low  Use temporary basal for exercise, starting 30 minutes before going for exercise

## 2013-09-01 ENCOUNTER — Encounter: Payer: Self-pay | Admitting: Endocrinology

## 2013-09-01 ENCOUNTER — Other Ambulatory Visit: Payer: Self-pay | Admitting: *Deleted

## 2013-09-01 ENCOUNTER — Ambulatory Visit (INDEPENDENT_AMBULATORY_CARE_PROVIDER_SITE_OTHER): Payer: PRIVATE HEALTH INSURANCE | Admitting: Endocrinology

## 2013-09-01 VITALS — BP 150/82 | HR 91 | Temp 98.3°F | Resp 12 | Ht 64.0 in | Wt 182.8 lb

## 2013-09-01 DIAGNOSIS — E1165 Type 2 diabetes mellitus with hyperglycemia: Principal | ICD-10-CM

## 2013-09-01 DIAGNOSIS — I10 Essential (primary) hypertension: Secondary | ICD-10-CM

## 2013-09-01 DIAGNOSIS — IMO0001 Reserved for inherently not codable concepts without codable children: Secondary | ICD-10-CM

## 2013-09-01 DIAGNOSIS — E785 Hyperlipidemia, unspecified: Secondary | ICD-10-CM

## 2013-09-01 DIAGNOSIS — G589 Mononeuropathy, unspecified: Secondary | ICD-10-CM

## 2013-09-01 MED ORDER — GLUCOSE BLOOD VI STRP: ORAL_STRIP | Status: DC

## 2013-09-01 NOTE — Progress Notes (Signed)
Patient ID: [name], female   DOB: 1951-06-05, 63 y.o.   MRN: [id number]  Reason for Appointment: Diabetes follow-up   History of Present Illness   Diagnosis: Type 2, date of diagnosis:1985.    PAST history: She has had difficult to control diabetes requiring large doses of insulin especially at meal times. Because of poor control she was switched from NovoLog to U-500 insulin in 11/2012. At that time her A1c was 9%  Initially seemed to have better blood sugars. With no clear response with Invokana this was stopped and she was started on Actos. Her insulin was adjusted with higher mealtime coverage at breakfast and lunch and lower amounts at supper. Lantus requirement appeared to be more in the morning and evening dose was eventually stopped. She  has been given Victoza and Invokana later in an attempt to improve her sugars but with only limited success. Because of continued poor control with subcutaneous insulin and large insulin requirement she was switched to a [employer] insulin pump which she started on 07/29/13 Prior to starting insulin pump her blood sugar at home was averaging 273  RECENT history:   Her blood sugars have  Continue excellent overall and she is requiring much less insulin than before the insulin pump  She is continuing to use the U-500 insulin in the pump With this she is requiring most of her basal insulin between 12 noon and 9 PM  GLUCOSE readings:  PREMEAL Breakfast Lunch Dinner Bedtime Overall  Glucose range:  85-191   123-196   111-174   56-183    Mean/median:      138  +/-53    Pump settings are: Basal rate midnight = 0.1, 3 AM = 0.05.7: 30 AM = 0.4. 12 noon = 0.7, 6 PM = 0.6 and 9 PM = 0.1 Carbohydrate coverage 1:20 and correction factor 1: 40 with target 110-120 BASAL total amount = 8.6 units, bolus percent use Recently 12- 51%  Current blood sugar patterns:  Relatively good readings before her first meal which is usually around 9-11 AM  Tendency  to high readings after breakfast with highest reading about 2-3 PM of 300. This may be partly from her eating cereal in the morning now more regularly  Relatively good readings after 6-7 PM when average readings as low as 92  Sporadic hypoglycemia which is mild and mostly in the evenings, lowest reading 56  Only one low reading overnight, 66   Meals: 3 meals per day, at 8 am, 1 pm and 6 pm Carbohydrate intake: 75 g daily on average.  Food preferences: She has oatmeal or cereal and juice,  toast  occasionally with egg, usually has a sandwich and salad for lunch and vegetables for dinner with fish or lean meat for protein. Will snack on apples, raisons or wheat thins.   Wt Readings from Last 3 Encounters:  09/01/13 182 lb 12.8 oz (82.918 kg)  08/06/13 183 lb 4.8 oz (83.144 kg)  07/29/13 174 lb 6.4 oz (79.107 kg)   Physical activity: exercise: Irregular now with taking care of family members; otherwise at the gym for  60 min, 5/7 days a week.  Dietician visit: Most recent: 2012  Retinal exam: Most recent: 2010.   Complications: No retinopathy or known nephropathy.    Lab Results  Component Value Date   HGBA1C 10.2* 08/03/2013    No visits with results within 1 Week(s) from this visit. Latest known visit with results is:  Appointment on 08/03/2013  Component Date Value Range Status  . Hemoglobin A1C 08/03/2013 10.2* 4.6 - 6.5 % Final   Glycemic Control Guidelines for People with Diabetes:Non Diabetic:  <6%Goal of Therapy: <7%Additional Action Suggested:  >8%   . Sodium 08/03/2013 135  135 - 145 mEq/L Final  . Potassium 08/03/2013 3.9  3.5 - 5.1 mEq/L Final  . Chloride 08/03/2013 99  96 - 112 mEq/L Final  . CO2 08/03/2013 26  19 - 32 mEq/L Final  . Glucose, Bld 08/03/2013 254* 70 - 99 mg/dL Final  . BUN 56/21/3086 12  6 - 23 mg/dL Final  . Creatinine, Ser 08/03/2013 1.2  0.4 - 1.2 mg/dL Final  . Total Bilirubin 08/03/2013 0.3  0.3 - 1.2 mg/dL Final  . Alkaline Phosphatase  08/03/2013 101  39 - 117 U/L Final  . AST 08/03/2013 31  0 - 37 U/L Final  . ALT 08/03/2013 27  0 - 35 U/L Final  . Total Protein 08/03/2013 7.7  6.0 - 8.3 g/dL Final  . Albumin 57/84/6962 3.7  3.5 - 5.2 g/dL Final  . Calcium 95/28/4132 9.3  8.4 - 10.5 mg/dL Final  . GFR 44/08/270 57.07* >60.00 mL/min Final  . Cholesterol 08/03/2013 197  0 - 200 mg/dL Final   ATP III Classification       Desirable:  < 200 mg/dL               Borderline High:  200 - 239 mg/dL          High:  > = 536 mg/dL  . Triglycerides 08/03/2013 157.0* 0.0 - 149.0 mg/dL Final   Normal:  <644 mg/dLBorderline High:  150 - 199 mg/dL  . HDL 08/03/2013 51.60  >39.00 mg/dL Final  . VLDL 03/47/4259 31.4  0.0 - 40.0 mg/dL Final  . LDL Cholesterol 08/03/2013 114* 0 - 99 mg/dL Final  . Total CHOL/HDL Ratio 08/03/2013 4   Final                  Men          Women1/2 Average Risk     3.4          3.3Average Risk          5.0          4.42X Average Risk          9.6          7.13X Average Risk          15.0          11.0                      . C-Peptide 08/03/2013 1.58  0.80 - 3.90 ng/mL Final  . Glucose, Bld 08/03/2013 254* 70 - 99 mg/dL Final      Medication List       This list is accurate as of: 09/01/13 10:57 AM.  Always use your most recent med list.               ACCU-CHEK FASTCLIX LANCETS Misc     albuterol 108 (90 BASE) MCG/ACT inhaler  Commonly known as:  PROVENTIL HFA;VENTOLIN HFA  Inhale 2 puffs into the lungs every 6 (six) hours as needed. For shortness of breath     amitriptyline 25 MG tablet  Commonly known as:  ELAVIL  Take 25 mg by mouth at bedtime.     amLODipine 10 MG tablet  Commonly known as:  NORVASC  Take 10 mg by mouth daily.     aspirin 81 MG chewable tablet  Chew 81 mg by mouth daily.     cephALEXin 500 MG capsule  Commonly known as:  KEFLEX  Take 1 capsule (500 mg total) by mouth 4 (four) times daily.     cetirizine 10 MG tablet  Commonly known as:  ZYRTEC  Take 10 mg by mouth  daily.     colchicine 0.6 MG tablet  Take 0.6 mg by mouth 2 (two) times daily.     cyclobenzaprine 10 MG tablet  Commonly known as:  FLEXERIL  Take 10 mg by mouth 3 (three) times daily as needed for muscle spasms.     DEXILANT 60 MG capsule  Generic drug:  dexlansoprazole  Take 60 mg by mouth daily.     EPIPEN 2-PAK 0.3 mg/0.3 mL Soaj injection  Generic drug:  EPINEPHrine     Fluticasone-Salmeterol 250-50 MCG/DOSE Aepb  Commonly known as:  ADVAIR  Inhale 1 puff into the lungs 2 (two) times daily as needed. For shortness of breath     gabapentin 300 MG capsule  Commonly known as:  NEURONTIN  Take 300 mg by mouth 3 (three) times daily as needed (for pain).     glucagon 1 MG injection  Commonly known as:  GLUCAGON EMERGENCY  Inject 1 mg into the vein once as needed.     glucose blood test strip  Commonly known as:  BAYER CONTOUR NEXT TEST  Use as instructed to check blood sugars 7 times per day     hydrOXYzine 25 MG tablet  Commonly known as:  ATARAX/VISTARIL  Take 25-50 mg by mouth every 6 (six) hours as needed. For itching     Insulin Glargine 100 UNIT/ML Solostar Pen  Commonly known as:  LANTUS SOLOSTAR  Inject 60 Units into the skin 2 (two) times daily. Take 60 units in the am and 16 units at bedtime     insulin regular human CONCENTRATED 500 UNIT/ML Soln injection  Commonly known as:  HUMULIN R  12 units at breakfast, 9 units at lunch and 12 units at supper     INVOKANA 300 MG Tabs  Generic drug:  Canagliflozin  Take 300 mg by mouth daily.     losartan-hydrochlorothiazide 100-25 MG per tablet  Commonly known as:  HYZAAR  Take 1 tablet by mouth daily.     multivitamin with minerals Tabs tablet  Take 1 tablet by mouth daily.     potassium chloride SA 20 MEQ tablet  Commonly known as:  K-DUR,KLOR-CON  Take 1 tablet (20 mEq total) by mouth daily.     pravastatin 40 MG tablet  Commonly known as:  PRAVACHOL  Take 40 mg by mouth daily.     spironolactone 25  MG tablet  Commonly known as:  ALDACTONE  Take 25 mg by mouth 2 (two) times daily.     VICTOZA 18 MG/3ML Soln injection  Generic drug:  Liraglutide  Inject 1.8 mg into the skin every morning.        Allergies:  Allergies  Allergen Reactions  . Codeine Itching and Nausea And Vomiting    Past Medical History  Diagnosis Date  . Diabetes mellitus   . Hypertension   . Arthritis   . Fibromyalgia     Past Surgical History  Procedure Laterality Date  . Cholecystectomy    . Colon surgery    . Esophagus dilated    . Arthroscopic knee surgery  Family History  Problem Relation Age of Onset  . Diabetes Mother   . Hypertension Mother   . Cancer Mother   . Diabetes Father   . Hypertension Father   . Diabetes Brother     Social History:  reports that she has never smoked. She has never used smokeless tobacco. She reports that she does not drink alcohol or use illicit drugs.  Review of Systems   She sees Eye Dr in 5/15 for followup  HYPERTENSION:  blood pressure is  only fairly well  controlled recently;  home readings 140-150/ 85-90. To followup with PCP this month  No history of recent pedal edema No history of burning or tingling in her feet HYPERLIPIDEMIA: This is not at goal with using pravastatin prescribed by PCP  Lab Results  Component Value Date   CHOL 197 08/03/2013   HDL 51.60 08/03/2013   LDLCALC 114* 08/03/2013   TRIG 157.0* 08/03/2013   CHOLHDL 4 08/03/2013     Examination:   BP 150/82  Pulse 91  Temp(Src) 98.3 F (36.8 C)  Resp 12  Ht 5\' 4"  (1.626 m)  Wt 182 lb 12.8 oz (82.918 kg)  BMI 31.36 kg/m2  SpO2 97%  Body mass index is 31.36 kg/(m^2).    no ankle edema  Assesment:    Diabetes type 2, uncontrolled  Her blood sugars are overall  still fairly good with using U-500 insulin in the insulin pump Most of her blood sugars are high after breakfast and early afternoon See history of present illness for details of current blood sugar  patterns, management and problems identified She appears to need better coverage of her carbohydrates in the morning and higher basal rate midday and afternoon  HYPERTENSION: This is still not adequately controlled and she will followup with PCP who is managing the hypertension   PLAN:  Will need to increase her basal rate  between about 11 AM and 9 PM as follows: 11 AM = 0.8, 5 PM = 0.55 Carbohydrate coverage 1:15 at breakfast and 1:20 after 1 PM She was assisted by nurse educator in making the changes  She knows to do a temporary basal when she starts exercising and can start this 30 minutes before exercising She will call if blood sugars are not consistently controlled Consider switching to NovoLog insulin although she is comfortable with current regimen and will wait until next visit to review this Reduce the dose of Victoza to 1.2 mg and consider stopping this is blood sugars are not different with this Continue Invokana  And metformin Resume exercise when able to   Counseling time over 50% of today's 25 minute visit  Danessa Mensch 09/01/2013, 10:57 AM  and

## 2013-09-01 NOTE — Patient Instructions (Signed)
Change settings  Victoza 1.2 mg daily

## 2013-09-10 ENCOUNTER — Ambulatory Visit: Payer: PRIVATE HEALTH INSURANCE | Admitting: *Deleted

## 2013-09-23 ENCOUNTER — Emergency Department (HOSPITAL_COMMUNITY)
Admission: EM | Admit: 2013-09-23 | Discharge: 2013-09-23 | Disposition: A | Discharge status: Home / Self Care | Payer: PRIVATE HEALTH INSURANCE | Attending: Emergency Medicine | Admitting: Emergency Medicine

## 2013-09-23 ENCOUNTER — Encounter (HOSPITAL_COMMUNITY): Payer: Self-pay | Admitting: Emergency Medicine

## 2013-09-23 ENCOUNTER — Emergency Department (HOSPITAL_COMMUNITY): Payer: PRIVATE HEALTH INSURANCE

## 2013-09-23 DIAGNOSIS — R062 Wheezing: Secondary | ICD-10-CM

## 2013-09-23 DIAGNOSIS — J029 Acute pharyngitis, unspecified: Secondary | ICD-10-CM

## 2013-09-23 DIAGNOSIS — B9789 Other viral agents as the cause of diseases classified elsewhere: Secondary | ICD-10-CM

## 2013-09-23 DIAGNOSIS — IMO0002 Reserved for concepts with insufficient information to code with codable children: Secondary | ICD-10-CM | POA: Insufficient documentation

## 2013-09-23 DIAGNOSIS — I10 Essential (primary) hypertension: Secondary | ICD-10-CM | POA: Insufficient documentation

## 2013-09-23 DIAGNOSIS — J069 Acute upper respiratory infection, unspecified: Secondary | ICD-10-CM | POA: Insufficient documentation

## 2013-09-23 DIAGNOSIS — R109 Unspecified abdominal pain: Secondary | ICD-10-CM | POA: Insufficient documentation

## 2013-09-23 DIAGNOSIS — Z794 Long term (current) use of insulin: Secondary | ICD-10-CM | POA: Insufficient documentation

## 2013-09-23 DIAGNOSIS — M129 Arthropathy, unspecified: Secondary | ICD-10-CM | POA: Insufficient documentation

## 2013-09-23 DIAGNOSIS — E119 Type 2 diabetes mellitus without complications: Secondary | ICD-10-CM | POA: Insufficient documentation

## 2013-09-23 DIAGNOSIS — Z79899 Other long term (current) drug therapy: Secondary | ICD-10-CM | POA: Insufficient documentation

## 2013-09-23 DIAGNOSIS — Z7982 Long term (current) use of aspirin: Secondary | ICD-10-CM | POA: Insufficient documentation

## 2013-09-23 MED ORDER — HYDROCODONE-ACETAMINOPHEN 5-325 MG PO TABS: 1.0000 | ORAL_TABLET | Freq: Four times a day (QID) | ORAL | Status: DC | PRN

## 2013-09-23 MED ORDER — AZITHROMYCIN 250 MG PO TABS: ORAL_TABLET | ORAL | Status: DC

## 2013-09-23 MED ORDER — PREDNISONE 50 MG PO TABS: ORAL_TABLET | ORAL | Status: DC

## 2013-09-23 MED ORDER — ALBUTEROL SULFATE (2.5 MG/3ML) 0.083% IN NEBU
2.5000 mg | INHALATION_SOLUTION | RESPIRATORY_TRACT | Status: AC
  Administered 2013-09-23: 2.5 mg via RESPIRATORY_TRACT
  Filled 2013-09-23: qty 3

## 2013-09-23 MED ORDER — PREDNISONE 20 MG PO TABS
60.0000 mg | ORAL_TABLET | Freq: Once | ORAL | Status: AC
  Administered 2013-09-23: 60 mg via ORAL
  Filled 2013-09-23: qty 3

## 2013-09-23 MED ORDER — HYDROCODONE-ACETAMINOPHEN 5-325 MG PO TABS
1.0000 | ORAL_TABLET | ORAL | Status: AC
  Administered 2013-09-23: 1 via ORAL
  Filled 2013-09-23 (×2): qty 1

## 2013-09-23 MED ORDER — HYDROCOD POLST-CHLORPHEN POLST 10-8 MG/5ML PO LQCR: 5.0000 mL | Freq: Every evening | ORAL | Status: DC | PRN

## 2013-09-23 NOTE — Discharge Instructions (Signed)

## 2013-09-23 NOTE — ED Provider Notes (Signed)
CSN: [id number]     Arrival date & time 09/23/13  1347 History   First MD Initiated Contact with Patient 09/23/13 1452     Chief Complaint  Patient presents with  . Sore Throat  . Flank Pain   (Consider location/radiation/quality/duration/timing/severity/associated sxs/prior Treatment) Patient is a 63 y.o. female presenting with pharyngitis, flank pain, and cough. The history is provided by the patient.  Sore Throat This is a new problem. The current episode started more than 2 days ago. The problem occurs constantly. The problem has not changed since onset.Pertinent negatives include no chest pain, no abdominal pain, no headaches and no shortness of breath. Nothing aggravates the symptoms. Nothing relieves the symptoms. She has tried nothing for the symptoms. The treatment provided no relief.  Flank Pain Pertinent negatives include no chest pain, no abdominal pain, no headaches and no shortness of breath.  Cough Cough characteristics:  Productive Sputum characteristics:  Pearline Cables Severity:  Moderate Onset quality:  Gradual Duration:  2 weeks Timing:  Constant Progression:  Unchanged Chronicity:  New Smoker: no   Context: upper respiratory infection   Relieved by:  Nothing Worsened by:  Nothing tried Ineffective treatments: cough syrup. Associated symptoms: sore throat and wheezing   Associated symptoms: no chest pain, no fever, no headaches, no myalgias and no shortness of breath     Past Medical History  Diagnosis Date  . Diabetes mellitus   . Hypertension   . Arthritis   . Fibromyalgia    Past Surgical History  Procedure Laterality Date  . Cholecystectomy    . Colon surgery    . Esophagus dilated    . Arthroscopic knee surgery    . Carpal tunnel release Bilateral    Family History  Problem Relation Age of Onset  . Diabetes Mother   . Hypertension Mother   . Cancer Mother   . Diabetes Father   . Hypertension Father   . Diabetes Brother    History  Substance Use  Topics  . Smoking status: Never Smoker   . Smokeless tobacco: Never Used  . Alcohol Use: No   OB History   Grav Para Term Preterm Abortions TAB SAB Ect Mult Living                 Review of Systems  Constitutional: Negative for fever and fatigue.  HENT: Positive for sore throat. Negative for congestion and drooling.   Eyes: Negative for pain.  Respiratory: Positive for cough and wheezing. Negative for shortness of breath.   Cardiovascular: Negative for chest pain.  Gastrointestinal: Negative for nausea, vomiting, abdominal pain and diarrhea.  Genitourinary: Positive for flank pain. Negative for dysuria and hematuria.  Musculoskeletal: Negative for back pain, gait problem, myalgias and neck pain.  Skin: Negative for color change.  Neurological: Negative for dizziness and headaches.  Hematological: Negative for adenopathy.  Psychiatric/Behavioral: Negative for behavioral problems.  All other systems reviewed and are negative.    Allergies  Codeine  Home Medications   Current Outpatient Rx  Name  Route  Sig  Dispense  Refill  . ACCU-CHEK FASTCLIX LANCETS MISC               . albuterol (PROVENTIL HFA;VENTOLIN HFA) 108 (90 BASE) MCG/ACT inhaler   Inhalation   Inhale 2 puffs into the lungs every 6 (six) hours as needed. For shortness of breath         . amLODipine (NORVASC) 10 MG tablet   Oral   Take 10 mg  by mouth daily.         [name] aspirin 81 MG chewable tablet   Oral   Chew 81 mg by mouth daily.         . Canagliflozin (INVOKANA) 300 MG TABS   Oral   Take 300 mg by mouth daily with breakfast.          . cetirizine (ZYRTEC) 10 MG tablet   Oral   Take 10 mg by mouth daily as needed.          . cyclobenzaprine (FLEXERIL) 10 MG tablet   Oral   Take 10 mg by mouth 3 (three) times daily as needed for muscle spasms.         [name] dexlansoprazole (DEXILANT) 60 MG capsule   Oral   Take 60 mg by mouth at bedtime. For acid reflex         . EPIPEN 2-PAK  0.3 MG/0.3ML SOAJ injection               . Fluticasone-Salmeterol (ADVAIR) 250-50 MCG/DOSE AEPB   Inhalation   Inhale 1 puff into the lungs 2 (two) times daily as needed. For shortness of breath         . gabapentin (NEURONTIN) 300 MG capsule   Oral   Take 300 mg by mouth daily. If needed can use up to tid         . glucagon (GLUCAGON EMERGENCY) 1 MG injection   Intravenous   Inject 1 mg into the vein once as needed.   1 each   12   . glucose blood (ACCU-CHEK SMARTVIEW) test strip      Use as instructed to check blood sugars 3 times per day dx code 250.00   100 each   5   . hydrOXYzine (ATARAX/VISTARIL) 25 MG tablet   Oral   Take 50 mg by mouth 2 (two) times daily. For itching         . insulin regular human CONCENTRATED (HUMULIN R) 500 UNIT/ML SOLN injection      12 units at breakfast, 9 units at lunch and 12 units at supper   20 mL   3   . Liraglutide (VICTOZA) 18 MG/3ML SOLN   Subcutaneous   Inject 1.2 mg into the skin every morning.          [name] losartan-hydrochlorothiazide (HYZAAR) 100-25 MG per tablet   Oral   Take 1 tablet by mouth daily.         . mirabegron ER (MYRBETRIQ) 50 MG TB24 tablet   Oral   Take 50 mg by mouth daily.         . Multiple Vitamin (MULITIVITAMIN WITH MINERALS) TABS   Oral   Take 1 tablet by mouth daily.         . potassium chloride SA (K-DUR,KLOR-CON) 20 MEQ tablet   Oral   Take 1 tablet (20 mEq total) by mouth daily.   5 tablet   0   . pravastatin (PRAVACHOL) 40 MG tablet   Oral   Take 40 mg by mouth daily.          [name] PRESCRIPTION MEDICATION   Intramuscular   Inject into the muscle every other day. Allergy Injections for itching         . spironolactone (ALDACTONE) 25 MG tablet   Oral   Take 25 mg by mouth daily.           BP 159/73  Pulse 96  Temp(Src) 98.5 F (36.9 C) (Oral)  Resp 20  SpO2 97% Physical Exam  Nursing note and vitals reviewed. Constitutional: She is oriented to person,  place, and time. She appears well-developed and well-nourished.  HENT:  Head: Normocephalic.  Mouth/Throat: Oropharynx is clear and moist. No oropharyngeal exudate.  Eyes: Conjunctivae and EOM are normal. Pupils are equal, round, and reactive to light.  Neck: Normal range of motion. Neck supple.  Cardiovascular: Normal rate, regular rhythm, normal heart sounds and intact distal pulses.  Exam reveals no gallop and no friction rub.   No murmur heard. Pulmonary/Chest: Effort normal. No respiratory distress. She has wheezes (mild expiratory wheezing heard most prominently in the upper lobes.). She exhibits tenderness (Moderate tenderness to palpation of the right lateral ribs extending to the back.).  Abdominal: Soft. Bowel sounds are normal. There is no tenderness. There is no rebound and no guarding.  Musculoskeletal: Normal range of motion. She exhibits no edema and no tenderness.  Neurological: She is alert and oriented to person, place, and time.  Skin: Skin is warm and dry.  Psychiatric: She has a normal mood and affect. Her behavior is normal.    ED Course  Procedures (including critical care time) Labs Review Labs Reviewed - No data to display Imaging Review Dg Chest 2 View  09/23/2013   CLINICAL DATA:  Right-sided chest pain and productive cough.  EXAM: CHEST  2 VIEW  COMPARISON:  Chest x-ray 06/12/2012.  FINDINGS: Small cluster of micronodules in the right mid lung (right lower lobe based on comparison with prior chest CT from 06/12/2012) is unchanged. No new acute consolidative airspace disease. No pleural effusions. No evidence of pulmonary edema. Heart size is normal. Mediastinal contours are unremarkable. Atherosclerosis in the thoracic aorta.  IMPRESSION: 1. No radiographic evidence of acute cardiopulmonary disease. 2. Small cluster of micronodularity in the right lower lobe, unchanged compared to numerous prior examinations, presumably foci of chronic mucoid impaction within  terminal bronchioles. 3. Atherosclerosis.   Electronically Signed   By: Vinnie Langton M.D.   On: 09/23/2013 15:55    EKG Interpretation    Date/Time:  Wednesday September 23 2013 15:10:52 EST Ventricular Rate:  92 PR Interval:  149 QRS Duration: 72 QT Interval:  374 QTC Calculation: 463 R Axis:   67 Text Interpretation:  Sinus rhythm Anteroseptal infarct, old artifact in I and II mildly limiting interpretation No significant change since last tracing Confirmed by Linsy Ehresman  MD, Daelyn Mozer ([id number]) on 09/23/2013 3:24:00 PM            MDM   1. Viral URI with cough   2. Wheezing   3. Pharyngitis    3:04 PM 63 y.o. female who presents with productive cough for 2 weeks. She notes her sputum is gray in color. She is also had a mild sore throat. She has developed right lateral chest wall pain radiating to her back which is worse with coughing and tender to palpation on exam. She denies any shortness of breath, but there is some mild wheezing on exam. She is afebrile and mildly hypertensive here. Will get chest x-ray, pain control with the Percocet, screening EKG, and breathing treatment due to mild wheezing.  4:37 PM: I interpreted/reviewed the labs and/or imaging which were non-contributory. Pt got alb neb x 1. She does have an inhaler at home. Likely component of reactive airway disease. Will prescribe prednisone as well as an antibiotic due to age and comorbidities. Will give norco Rx for pain and tussionex  for cough. I warned her and her family member that she should not take the cough syrup and the pain medicine at the same time. She understands. I have discussed the diagnosis/risks/treatment options with the patient and family and believe the pt to be eligible for discharge home to follow-up with pcp in 2 days if no better. We also discussed returning to the ED immediately if new or worsening sx occur. We discussed the sx which are most concerning (e.g., sob, worsening pain) that necessitate  immediate return. Medications administered to the patient during their visit and any new prescriptions provided to the patient are listed below.  Medications  HYDROcodone-acetaminophen (NORCO/VICODIN) 5-325 MG per tablet 1 tablet (1 tablet Oral Given 09/23/13 1512)  albuterol (PROVENTIL) (2.5 MG/3ML) 0.083% nebulizer solution 2.5 mg (2.5 mg Nebulization Given 09/23/13 1535)  predniSONE (DELTASONE) tablet 60 mg (60 mg Oral Given 09/23/13 1635)    New Prescriptions   AZITHROMYCIN (ZITHROMAX Z-PAK) 250 MG TABLET    2 po day one, then 1 daily x 4 days   CHLORPHENIRAMINE-HYDROCODONE (TUSSIONEX PENNKINETIC ER) 10-8 MG/5ML LQCR    Take 5 mLs by mouth at bedtime as needed for cough.   HYDROCODONE-ACETAMINOPHEN (NORCO) 5-325 MG PER TABLET    Take 1 tablet by mouth every 6 (six) hours as needed for moderate pain (Please do not take at the same time as the Tussionex cough syrup.).   PREDNISONE (DELTASONE) 50 MG TABLET    Take 1 tablet by mouth daily for 4 days.     Blanchard Kelch, MD 09/23/13 [id number]

## 2013-09-23 NOTE — ED Notes (Signed)
Pt c/o sore throat, cough, and R flank pain x 1 week.  Pain score 8/10.  Pt sts pain increases w/ coughing.

## 2013-10-09 ENCOUNTER — Other Ambulatory Visit (INDEPENDENT_AMBULATORY_CARE_PROVIDER_SITE_OTHER): Payer: PRIVATE HEALTH INSURANCE

## 2013-10-09 DIAGNOSIS — E1165 Type 2 diabetes mellitus with hyperglycemia: Principal | ICD-10-CM

## 2013-10-09 DIAGNOSIS — IMO0001 Reserved for inherently not codable concepts without codable children: Secondary | ICD-10-CM

## 2013-10-09 LAB — BASIC METABOLIC PANEL
BUN: 13 mg/dL (ref 6–23)
CALCIUM: 9.6 mg/dL (ref 8.4–10.5)
CO2: 30 mEq/L (ref 19–32)
Chloride: 102 mEq/L (ref 96–112)
Creatinine, Ser: 1.3 mg/dL — ABNORMAL HIGH (ref 0.4–1.2)
GFR: 55.47 mL/min — AB (ref >60.00)
Glucose, Bld: 136 mg/dL — ABNORMAL HIGH (ref 70–99)
Potassium: 3.6 mEq/L (ref 3.5–5.1)
SODIUM: 140 meq/L (ref 135–145)

## 2013-10-09 LAB — LIPID PANEL
CHOLESTEROL: 210 mg/dL — AB (ref 0–200)
HDL: 53.5 mg/dL (ref >39.00)
Total CHOL/HDL Ratio: 4
Triglycerides: 66 mg/dL (ref 0.0–149.0)
VLDL: 13.2 mg/dL (ref 0.0–40.0)

## 2013-10-09 LAB — LDL CHOLESTEROL, DIRECT: Direct LDL: 152.8 mg/dL

## 2013-10-09 LAB — HEMOGLOBIN A1C: Hgb A1c MFr Bld: 7.2 % — ABNORMAL HIGH (ref 4.6–6.5)

## 2013-10-13 ENCOUNTER — Ambulatory Visit: Payer: PRIVATE HEALTH INSURANCE | Admitting: Endocrinology

## 2013-10-22 ENCOUNTER — Ambulatory Visit: Payer: PRIVATE HEALTH INSURANCE | Admitting: Endocrinology

## 2013-10-26 ENCOUNTER — Emergency Department (INDEPENDENT_AMBULATORY_CARE_PROVIDER_SITE_OTHER)
Admission: EM | Admit: 2013-10-26 | Discharge: 2013-10-26 | Disposition: A | Discharge status: Home / Self Care | Payer: PRIVATE HEALTH INSURANCE | Source: Home / Self Care | Attending: Emergency Medicine | Admitting: Emergency Medicine

## 2013-10-26 ENCOUNTER — Emergency Department (INDEPENDENT_AMBULATORY_CARE_PROVIDER_SITE_OTHER): Payer: PRIVATE HEALTH INSURANCE

## 2013-10-26 ENCOUNTER — Encounter (HOSPITAL_COMMUNITY): Payer: Self-pay | Admitting: Emergency Medicine

## 2013-10-26 DIAGNOSIS — M1710 Unilateral primary osteoarthritis, unspecified knee: Secondary | ICD-10-CM

## 2013-10-26 DIAGNOSIS — IMO0002 Reserved for concepts with insufficient information to code with codable children: Secondary | ICD-10-CM

## 2013-10-26 DIAGNOSIS — M1711 Unilateral primary osteoarthritis, right knee: Secondary | ICD-10-CM

## 2013-10-26 LAB — D-DIMER, QUANTITATIVE: D-Dimer, Quant: 0.36 ug/mL-FEU (ref 0.00–0.48)

## 2013-10-26 MED ORDER — METHYLPREDNISOLONE ACETATE 40 MG/ML IJ SUSP
INTRAMUSCULAR | Status: AC
  Filled 2013-10-26: qty 5

## 2013-10-26 MED ORDER — HYDROCODONE-ACETAMINOPHEN 5-325 MG PO TABS
2.0000 | ORAL_TABLET | Freq: Once | ORAL | Status: AC
  Administered 2013-10-26: 2 via ORAL

## 2013-10-26 MED ORDER — OXYCODONE-ACETAMINOPHEN 5-325 MG PO TABS
ORAL_TABLET | ORAL | Status: DC
Start: 2013-10-26 — End: 2013-11-14

## 2013-10-26 MED ORDER — SALSALATE 750 MG PO TABS: ORAL_TABLET | ORAL | Status: DC

## 2013-10-26 MED ORDER — HYDROCODONE-ACETAMINOPHEN 5-325 MG PO TABS
ORAL_TABLET | ORAL | Status: AC
  Filled 2013-10-26: qty 2

## 2013-10-26 NOTE — ED Notes (Signed)
C/o right leg pain x 1 wk.   Pain is felt mostly around right knee.  Pt describes the pain as a constant throbbing. "keeps me awake at night".    Denies injury.   No relief with otc meds or hot/cold compresses.

## 2013-10-26 NOTE — Discharge Instructions (Signed)
Osteoarthritis Osteoarthritis is a disease that causes soreness and swelling (inflammation) of a joint. It occurs when the cartilage at the affected joint wears down. Cartilage acts as a cushion, covering the ends of bones where they meet to form a joint. Osteoarthritis is the most common form of arthritis. It often occurs in older people. The joints affected most often by this condition include those in the:  Ends of the fingers.  Thumbs.  Neck.  Lower back.  Knees.  Hips. CAUSES  Over time, the cartilage that covers the ends of bones begins to wear away. This causes bone to rub on bone, producing pain and stiffness in the affected joints.  RISK FACTORS Certain factors can increase your chances of having osteoarthritis, including:  Older age.  Excessive body weight.  Overuse of joints. SIGNS AND SYMPTOMS   Pain, swelling, and stiffness in the joint.  Over time, the joint may lose its normal shape.  Small deposits of bone (osteophytes) may grow on the edges of the joint.  Bits of bone or cartilage can break off and float inside the joint space. This may cause more pain and damage. DIAGNOSIS  Your health care provider will do a physical exam and ask about your symptoms. Various tests may be ordered, such as:  X-rays of the affected joint.  An MRI scan.  Blood tests to rule out other types of arthritis.  Joint fluid tests. This involves using a needle to draw fluid from the joint and examining the fluid under a microscope. TREATMENT  Goals of treatment are to control pain and improve joint function. Treatment plans may include:  A prescribed exercise program that allows for rest and joint relief.  A weight control plan.  Pain relief techniques, such as:  Properly applied heat and cold.  Electric pulses delivered to nerve endings under the skin (transcutaneous electrical nerve stimulation, TENS).  Massage.  Certain nutritional supplements.  Medicines to  control pain, such as:  Acetaminophen.  Nonsteroidal anti-inflammatory drugs (NSAIDs), such as naproxen.  Narcotic or central-acting agents, such as tramadol.  Corticosteroids. These can be given orally or as an injection.  Surgery to reposition the bones and relieve pain (osteotomy) or to remove loose pieces of bone and cartilage. Joint replacement may be needed in advanced states of osteoarthritis. HOME CARE INSTRUCTIONS   Only take over-the-counter or prescription medicines as directed by your health care provider. Take all medicines exactly as instructed.  Maintain a healthy weight. Follow your health care provider's instructions for weight control. This may include dietary instructions.  Exercise as directed. Your health care provider can recommend specific types of exercise. These may include:  Strengthening exercises These are done to strengthen the muscles that support joints affected by arthritis. They can be performed with weights or with exercise bands to add resistance.  Aerobic activities These are exercises, such as brisk walking or low-impact aerobics, that get your heart pumping.  Range-of-motion activities These keep your joints limber.  Balance and agility exercises These help you maintain daily living skills.  Rest your affected joints as directed by your health care provider.  Follow up with your health care provider as directed. SEEK MEDICAL CARE IF:   Your skin turns red.  You develop a rash in addition to your joint pain.  You have worsening joint pain. SEEK IMMEDIATE MEDICAL CARE IF:  You have a significant loss of weight or appetite.  You have a fever along with joint or muscle aches.  You have   night sweats. FOR MORE INFORMATION  National Institute of Arthritis and Musculoskeletal and Skin Diseases: www.niams.nih.gov National Institute on Aging: www.nia.nih.gov American College of Rheumatology: www.rheumatology.org Document Released: 08/13/2005  Document Revised: 06/03/2013 Document Reviewed: 04/20/2013 [employer] Patient Information 2014 [employer].  

## 2013-10-26 NOTE — ED Provider Notes (Signed)
Chief Complaint   Chief Complaint  Patient presents with  . Leg Pain    History of Present Illness   [name] is a 63 year old female who's had a one-week history of pain in the right leg. This extends from the knee to the thigh and the pain is continuous. It hurts to say whether she walks, sits, stands, or lies down. It bothers her at nighttime. It seems swollen feels hot. She denies any injury to the leg. The leg burns and feels numb. She denies any calf pain or swelling there is no palpable cord. She denies any shortness of breath or chest pain. There's been no fever or chills. She does have a history of diabetes. She is being followed at [facility].  Review of Systems   Other than as noted above, the patient denies any of the following symptoms: Systemic:  No fever, chills, sweats, weight gain or loss. Respiratory:  No coughing, wheezing, or shortness of breath. Cardiac:  No chest pain, tightness, pressure or syncope. GI:  No abdominal pain, swelling, distension, nausea, or vomiting. GU:  No dysuria, frequency, or hematuria. Ext:  No joint pain or muscle pain. Skin:  No rash or itching. Neuro:  No paresthesias or muscle weakness.  [place]   Past medical history, family history, social history, meds, and allergies were reviewed.  She has a history of diabetes, hypertension, and elevated cholesterol. Current meds include amlodipine, aspirin, Invokana, DEXILANT, Neurontin, Vistaril, Humulin insulin, Victoza, Hyzaar, Myrbetriq, potassium chloride, pravastatin, prednisone, spironolactone, Zyrtec, Flexeril, Advair, and Norco.  Physical Examination     Vital signs:  BP 190/92  Pulse 89  Temp(Src) 98 F (36.7 C) (Oral)  Resp 20  SpO2 98% Gen:  Alert, oriented, in no distress. Neck:  No tenderness, adenopathy, or JVD. Lungs:  Breath sounds clear and equal bilaterally.  No rales, rhonchi or wheezes. Heart:  Regular rhythm, no gallops or murmers. Abdomen:  Soft,  nontender, no organomegaly or mass. Ext:  There is diffuse pain to palpation around the knee. There is even skin sensitivity. Her knee has a full range of motion, although she complains of pain with movement. There is no crepitus. McMurray sign is negative, Lachman's sign is negative, anterior drawer sign is negative, varus and valgus stress are negative but do produce pain. Neuro:  Alert and oriented times 3.  No muscle weakness.  Sensation intact to light touch. Skin:  Warm and dry.  No rash or skin lesions.  Labs   Results for orders placed during the hospital encounter of 10/26/13  D-DIMER, QUANTITATIVE      Result Value Ref Range   D-Dimer, Quant 0.36  0.00 - 0.48 ug/mL-FEU     Radiology   Dg Knee Complete 4 Views Right  10/26/2013   CLINICAL DATA:  Right knee pain.  No known injury.  EXAM: RIGHT KNEE - COMPLETE 4+ VIEW  COMPARISON:  MR EXTREM LOW JT*R* W/O CM dated 02/07/2009; KNEE dated 02/02/2009  FINDINGS: Mild degenerative spurring in the patellofemoral compartment. Slight joint space narrowing diffusely. No fracture, subluxation or dislocation. Probable small joint effusion.  IMPRESSION: Mild degenerative changes. Small joint effusion. No acute bony abnormality.   Electronically Signed   By: Rolm Baptise M.D.   On: 10/26/2013 12:00   I reviewed the images independently and personally and concur with the radiologist's findings.  Procedure Note   Verbal informed consent was obtained from the patient.  Risks and benefits were outlined with the patient.  Patient understands and accepts these risks. A time out was called and the procedure and identity of the patient were confirmed verbally.    The procedure was then performed as follows:  The lateral aspect of the knee was prepped with Betadine and alcohol and is that of chloride spray. Using a 1/2 inch 27-gauge needle, 1 cc of Depo-Medrol 80 mg strength and 2 cc of 2% Xylocaine were injected into the knee joint. The patient tolerated the  procedure very well.  The patient tolerated the procedure well without any immediate complications.  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  Differential diagnosis is osteoarthritis versus peripheral neuropathy. We'll see how the joint injection works. She was encouraged to followup with her primary care physician within a week.  Plan   1.  Meds:  The following meds were prescribed:   Discharge Medication List as of 10/26/2013 12:47 PM    START taking these medications   Details  oxyCODONE-acetaminophen (PERCOCET) 5-325 MG per tablet 1 to 2 tablets every 6 hours as needed for pain., Print    salsalate (DISALCID) 750 MG tablet 2 tabs twice daily with food for arthritis, Normal        2.  Patient Education/Counseling:  The patient was given appropriate handouts, self care instructions, and instructed in symptomatic relief.    3.  Follow up:  The patient was told to follow up here if no better in 3 to 4 days, or sooner if becoming worse in any way, and given some red flag symptoms such as worsening swelling, chest pain, shortness of breath or fever which would prompt immediate return.       Harden Mo, MD 10/26/13 2221

## 2013-10-27 ENCOUNTER — Ambulatory Visit (INDEPENDENT_AMBULATORY_CARE_PROVIDER_SITE_OTHER): Payer: PRIVATE HEALTH INSURANCE | Admitting: Endocrinology

## 2013-10-27 ENCOUNTER — Encounter: Payer: Self-pay | Admitting: Endocrinology

## 2013-10-27 VITALS — BP 142/82 | HR 98 | Temp 98.4°F | Resp 16 | Ht 64.0 in | Wt 184.8 lb

## 2013-10-27 DIAGNOSIS — E1165 Type 2 diabetes mellitus with hyperglycemia: Principal | ICD-10-CM

## 2013-10-27 DIAGNOSIS — IMO0001 Reserved for inherently not codable concepts without codable children: Secondary | ICD-10-CM

## 2013-10-27 DIAGNOSIS — E785 Hyperlipidemia, unspecified: Secondary | ICD-10-CM

## 2013-10-27 NOTE — Progress Notes (Addendum)
Patient ID: [name], female   DOB: Apr 08, 1951, 63 y.o.   MRN: [id number]   Reason for Appointment: Diabetes follow-up   History of Present Illness   Diagnosis: Type 2, date of diagnosis:1985.    PAST history: She has had difficult to control diabetes requiring large doses of insulin especially at meal times. Because of poor control she was switched from NovoLog to U-500 insulin in 11/2012. At that time her A1c was 9%  Initially seemed to have better blood sugars. With no clear response with Invokana this was stopped and she was started on Actos. Her insulin was adjusted with higher mealtime coverage at breakfast and lunch and lower amounts at supper. Lantus requirement appeared to be more in the morning and evening dose was eventually stopped. She  has been given Victoza and Invokana later in an attempt to improve her sugars but with only limited success. Because of continued poor control with subcutaneous insulin and large insulin requirement she was switched to a [employer] insulin pump which she started on 07/29/13 Prior to starting insulin pump her blood sugar at home was averaging 273  RECENT history:   Her blood sugars have been fairly good overall and she is requiring much less insulin than before the insulin pump  Her A1c is dramatically better also with previous A1c readings usually around 10% She is continuing to use the U-500 insulin in the pump With the pump she is requiring most of her basal insulin between 11 AM-9 PM Pump settings are: Basal rate midnight = 0.1, 3 AM = 0.05.7: 30 AM = 0.4. 11 am = 0.8, 5 PM = 0.55 and 9 PM = 0.1 Carbohydrate coverage 1:15 until 1 PM, after 1 PM 1:20 and correction factor 1: 40 with target 110-120 BASAL total amount = 9.3 units, bolus percent use 36 %  GLUCOSE readings:  PREMEAL Breakfast Lunch Dinner Bedtime Overall  Glucose range:  101-298   80-244   107-193     Mean/median:  140     126   158+/-58    Current blood sugar  patterns:  Fairly good readings in the morning before her first meal  Tendency to rise in blood sugars until about 4-5 PM  Improving blood sugars early evening but with significant variability  Mostly good readings after 9 PM and late night  She is having most of her meals between 12 noon-8 PM and generally only 2 meals a day. May tend to have higher postprandial readings in the mornings occasionally but not later in the day. This is despite increasing her carbohydrate coverage  Blood sugar pattern from day to day is quite variable with excellent readings on some days and persistently high readings on other days  HYPOGLYCEMIA: She apparently had a severe episode about 6 weeks ago around midnight and needed assistance. No hypoglycemia since then  Meals: 3 meals per day, at 11 am, 3 pm and 7 pm Carbohydrate intake: 75 g daily on average.  Food preferences: She has oatmeal or cereal and juice,  toast  occasionally with egg, usually has a sandwich and salad for lunch and vegetables for dinner with fish or lean meat for protein. Will snack on apples, raisons or wheat thins.   Wt Readings from Last 3 Encounters:  10/27/13 184 lb 12.8 oz (83.825 kg)  09/01/13 182 lb 12.8 oz (82.918 kg)  08/06/13 183 lb 4.8 oz (83.144 kg)   Physical activity: exercise: Irregular now with taking care of family members; otherwise  at the gym for  60 min, 5/7 days a week.  Dietician visit: Most recent: 2012  Retinal exam: Most recent: 2010.   Complications: No retinopathy or known nephropathy.    Lab Results  Component Value Date   HGBA1C 7.2* 10/09/2013   HGBA1C 10.2* 08/03/2013   HGBA1C 10.3* 03/16/2013   Lab Results  Component Value Date   MICROALBUR 3.0* 03/16/2013   LDLCALC 114* 08/03/2013   CREATININE 1.3* 10/09/2013       Medication List       This list is accurate as of: 10/27/13  1:22 PM.  Always use your most recent med list.               ACCU-CHEK FASTCLIX LANCETS Misc     albuterol  108 (90 BASE) MCG/ACT inhaler  Commonly known as:  PROVENTIL HFA;VENTOLIN HFA  Inhale 2 puffs into the lungs every 6 (six) hours as needed. For shortness of breath     amLODipine 10 MG tablet  Commonly known as:  NORVASC  Take 10 mg by mouth daily.     aspirin 81 MG chewable tablet  Chew 81 mg by mouth daily.     azithromycin 250 MG tablet  Commonly known as:  ZITHROMAX Z-PAK  2 po day one, then 1 daily x 4 days     cetirizine 10 MG tablet  Commonly known as:  ZYRTEC  Take 10 mg by mouth daily as needed.     chlorpheniramine-HYDROcodone 10-8 MG/5ML Lqcr  Commonly known as:  TUSSIONEX PENNKINETIC ER  Take 5 mLs by mouth at bedtime as needed for cough.     cyclobenzaprine 10 MG tablet  Commonly known as:  FLEXERIL  Take 10 mg by mouth 3 (three) times daily as needed for muscle spasms.     DEXILANT 60 MG capsule  Generic drug:  dexlansoprazole  Take 60 mg by mouth at bedtime. For acid reflex     EPIPEN 2-PAK 0.3 mg/0.3 mL Soaj injection  Generic drug:  EPINEPHrine     Fluticasone-Salmeterol 250-50 MCG/DOSE Aepb  Commonly known as:  ADVAIR  Inhale 1 puff into the lungs 2 (two) times daily as needed. For shortness of breath     gabapentin 300 MG capsule  Commonly known as:  NEURONTIN  Take 300 mg by mouth daily. If needed can use up to tid     glucagon 1 MG injection  Commonly known as:  GLUCAGON EMERGENCY  Inject 1 mg into the vein once as needed.     glucose blood test strip  Commonly known as:  ACCU-CHEK SMARTVIEW  Use as instructed to check blood sugars 3 times per day dx code 250.00     HYDROcodone-acetaminophen 5-325 MG per tablet  Commonly known as:  NORCO  Take 1 tablet by mouth every 6 (six) hours as needed for moderate pain (Please do not take at the same time as the Tussionex cough syrup.).     hydrOXYzine 25 MG tablet  Commonly known as:  ATARAX/VISTARIL  Take 50 mg by mouth 2 (two) times daily. For itching     insulin regular human CONCENTRATED 500  UNIT/ML Soln injection  Commonly known as:  HUMULIN R  12 units at breakfast, 9 units at lunch and 12 units at supper     INVOKANA 300 MG Tabs  Generic drug:  Canagliflozin  Take 300 mg by mouth daily with breakfast.     losartan-hydrochlorothiazide 100-25 MG per tablet  Commonly known as:  HYZAAR  Take 1 tablet by mouth daily.     mirabegron ER 50 MG Tb24 tablet  Commonly known as:  MYRBETRIQ  Take 50 mg by mouth daily.     multivitamin with minerals Tabs tablet  Take 1 tablet by mouth daily.     oxyCODONE-acetaminophen 5-325 MG per tablet  Commonly known as:  PERCOCET  1 to 2 tablets every 6 hours as needed for pain.     potassium chloride SA 20 MEQ tablet  Commonly known as:  K-DUR,KLOR-CON  Take 1 tablet (20 mEq total) by mouth daily.     pravastatin 40 MG tablet  Commonly known as:  PRAVACHOL  Take 40 mg by mouth daily.     predniSONE 50 MG tablet  Commonly known as:  DELTASONE  Take 1 tablet by mouth daily for 4 days.     PRESCRIPTION MEDICATION  Inject into the muscle every other day. Allergy Injections for itching     salsalate 750 MG tablet  Commonly known as:  DISALCID  2 tabs twice daily with food for arthritis     spironolactone 25 MG tablet  Commonly known as:  ALDACTONE  Take 25 mg by mouth daily.     VICTOZA 18 MG/3ML Soln injection  Generic drug:  Liraglutide  Inject 1.2 mg into the skin every morning.        Allergies:  Allergies  Allergen Reactions  . Codeine Itching and Nausea And Vomiting    Past Medical History  Diagnosis Date  . Diabetes mellitus   . Hypertension   . Arthritis   . Fibromyalgia     Past Surgical History  Procedure Laterality Date  . Cholecystectomy    . Colon surgery    . Esophagus dilated    . Arthroscopic knee surgery    . Carpal tunnel release Bilateral     Family History  Problem Relation Age of Onset  . Diabetes Mother   . Hypertension Mother   . Cancer Mother   . Diabetes Father   .  Hypertension Father   . Diabetes Brother     Social History:  reports that she has never smoked. She has never used smokeless tobacco. She reports that she does not drink alcohol or use illicit drugs.  Review of Systems   She sees Eye Dr in 5/15 for followup  HYPERTENSION:  blood pressure is  only fairly well  controlled recently;  home readings 140-150/ 85-90. To followup with PCP this month  No history of recent pedal edema No history of burning or tingling in her feet HYPERLIPIDEMIA: This is not at goal with using pravastatin prescribed by PCP  Lab Results  Component Value Date   CHOL 210* 10/09/2013   HDL 53.50 10/09/2013   LDLCALC 114* 08/03/2013   LDLDIRECT 152.8 10/09/2013   TRIG 66.0 10/09/2013   CHOLHDL 4 10/09/2013     Examination:   BP 142/82  Pulse 98  Temp(Src) 98.4 F (36.9 C)  Resp 16  Ht 5\' 4"  (1.626 m)  Wt 184 lb 12.8 oz (83.825 kg)  BMI 31.71 kg/m2  SpO2 98%  Body mass index is 31.71 kg/(m^2).    no ankle edema  Assesment:    Diabetes type 2, uncontrolled  Her blood sugars are overall  still fairly good with using U-500 insulin in the insulin pump Most of her blood sugars are again high after breakfast and early afternoon See history of present illness for details of current blood sugar patterns,  management and problems identified She appears to need more insulin in the afternoons but her blood sugar patterns are somewhat variable  She probably does not have adequate coverage of higher carbohydrate meals because of the slow action of U-500  HYPERTENSION: This is better controlled and she will continue to followup with PCP who is managing the hypertension   PLAN:  Will need to increase her basal rate  between about 1 PM-5 PM to 0.9 Because of her episode of late-night hypoglycemia will reduce her basal rate from 8 PM onwards to 0.1  Will switch her to NovoLog insulin since she did get better mealtime control and also prevent unexpected hypoglycemia  overnight when she is requiring minimal insulin at present She will need to be seen right after the change to help adjust her basal rates  Continue Invokana  And metformin Resume exercise when able to   Counseling time over 50% of today's 25 minute visit  Izaia Say 10/27/2013, 1:22 PM   Addendum: Insulin pump settings for switching to NovoLog will be as follows Basal rate midnight = 0.6, 3 AM = 0.7, 8 AM = 1.5, 11 AM = 3.5, 5 PM = 2.5 and 9 PM = 1.5. Carbohydrate coverage 1:5, correction 1:40 and target 110-120

## 2013-10-27 NOTE — Patient Instructions (Signed)
Basal rates as indicated

## 2013-11-11 NOTE — Telephone Encounter (Signed)
No phone call made or received.  This was an error

## 2013-11-13 ENCOUNTER — Other Ambulatory Visit (INDEPENDENT_AMBULATORY_CARE_PROVIDER_SITE_OTHER): Payer: PRIVATE HEALTH INSURANCE

## 2013-11-13 ENCOUNTER — Other Ambulatory Visit: Payer: Self-pay | Admitting: *Deleted

## 2013-11-13 ENCOUNTER — Other Ambulatory Visit: Payer: PRIVATE HEALTH INSURANCE

## 2013-11-13 ENCOUNTER — Telehealth (HOSPITAL_COMMUNITY): Payer: Self-pay | Admitting: Radiology

## 2013-11-13 DIAGNOSIS — E119 Type 2 diabetes mellitus without complications: Secondary | ICD-10-CM

## 2013-11-13 LAB — LIPID PANEL
CHOLESTEROL: 178 mg/dL (ref 0–200)
HDL: 58.3 mg/dL (ref >39.00)
LDL Cholesterol: 99 mg/dL (ref 0–99)
Total CHOL/HDL Ratio: 3
Triglycerides: 105 mg/dL (ref 0.0–149.0)
VLDL: 21 mg/dL (ref 0.0–40.0)

## 2013-11-13 LAB — COMPREHENSIVE METABOLIC PANEL
ALT: 20 U/L (ref 0–35)
AST: 22 U/L (ref 0–37)
Albumin: 3.5 g/dL (ref 3.5–5.2)
Alkaline Phosphatase: 88 U/L (ref 39–117)
BILIRUBIN TOTAL: 0.5 mg/dL (ref 0.3–1.2)
BUN: 17 mg/dL (ref 6–23)
CHLORIDE: 99 meq/L (ref 96–112)
CO2: 29 meq/L (ref 19–32)
Calcium: 9.3 mg/dL (ref 8.4–10.5)
Creatinine, Ser: 1.3 mg/dL — ABNORMAL HIGH (ref 0.4–1.2)
GFR: 53.02 mL/min — AB (ref >60.00)
GLUCOSE: 259 mg/dL — AB (ref 70–99)
Potassium: 3.7 mEq/L (ref 3.5–5.1)
SODIUM: 138 meq/L (ref 135–145)
Total Protein: 7.3 g/dL (ref 6.0–8.3)

## 2013-11-13 NOTE — ED Notes (Signed)
Lab tech from [facility] office called requesting we change order for D-dimer on 10/26/2013 to a future order.  Chart was reviewed.  The D-dimer ordered on 10/26/2013 was performed in the office at the time she was seen.  Tech made aware there was not an order for a future D-dimer.  Tech states patient was called and told to come in and have blood drawn.  Tech advised she did not receive a call from this office.

## 2013-11-14 ENCOUNTER — Inpatient Hospital Stay (HOSPITAL_COMMUNITY)
Admission: EM | Admit: 2013-11-14 | Discharge: 2013-11-16 | DRG: 558 | Disposition: A | Discharge status: Home / Self Care | Payer: PRIVATE HEALTH INSURANCE | Attending: Internal Medicine | Admitting: Internal Medicine

## 2013-11-14 ENCOUNTER — Encounter (HOSPITAL_COMMUNITY): Payer: Self-pay | Admitting: Emergency Medicine

## 2013-11-14 DIAGNOSIS — D179 Benign lipomatous neoplasm, unspecified: Secondary | ICD-10-CM

## 2013-11-14 DIAGNOSIS — B3749 Other urogenital candidiasis: Secondary | ICD-10-CM

## 2013-11-14 DIAGNOSIS — G589 Mononeuropathy, unspecified: Secondary | ICD-10-CM

## 2013-11-14 DIAGNOSIS — F329 Major depressive disorder, single episode, unspecified: Secondary | ICD-10-CM

## 2013-11-14 DIAGNOSIS — Z794 Long term (current) use of insulin: Secondary | ICD-10-CM

## 2013-11-14 DIAGNOSIS — B373 Candidiasis of vulva and vagina: Secondary | ICD-10-CM

## 2013-11-14 DIAGNOSIS — Z79899 Other long term (current) drug therapy: Secondary | ICD-10-CM

## 2013-11-14 DIAGNOSIS — M6282 Rhabdomyolysis: Principal | ICD-10-CM

## 2013-11-14 DIAGNOSIS — Z8249 Family history of ischemic heart disease and other diseases of the circulatory system: Secondary | ICD-10-CM

## 2013-11-14 DIAGNOSIS — M67919 Unspecified disorder of synovium and tendon, unspecified shoulder: Secondary | ICD-10-CM

## 2013-11-14 DIAGNOSIS — Z6832 Body mass index (BMI) 32.0-32.9, adult: Secondary | ICD-10-CM

## 2013-11-14 DIAGNOSIS — M719 Bursopathy, unspecified: Secondary | ICD-10-CM

## 2013-11-14 DIAGNOSIS — E876 Hypokalemia: Secondary | ICD-10-CM | POA: Diagnosis present

## 2013-11-14 DIAGNOSIS — N390 Urinary tract infection, site not specified: Secondary | ICD-10-CM

## 2013-11-14 DIAGNOSIS — E1165 Type 2 diabetes mellitus with hyperglycemia: Secondary | ICD-10-CM

## 2013-11-14 DIAGNOSIS — I83893 Varicose veins of bilateral lower extremities with other complications: Secondary | ICD-10-CM

## 2013-11-14 DIAGNOSIS — Z9641 Presence of insulin pump (external) (internal): Secondary | ICD-10-CM

## 2013-11-14 DIAGNOSIS — Z833 Family history of diabetes mellitus: Secondary | ICD-10-CM

## 2013-11-14 DIAGNOSIS — Z7982 Long term (current) use of aspirin: Secondary | ICD-10-CM

## 2013-11-14 DIAGNOSIS — K219 Gastro-esophageal reflux disease without esophagitis: Secondary | ICD-10-CM

## 2013-11-14 DIAGNOSIS — IMO0001 Reserved for inherently not codable concepts without codable children: Secondary | ICD-10-CM

## 2013-11-14 DIAGNOSIS — E785 Hyperlipidemia, unspecified: Secondary | ICD-10-CM

## 2013-11-14 DIAGNOSIS — M797 Fibromyalgia: Secondary | ICD-10-CM

## 2013-11-14 DIAGNOSIS — E119 Type 2 diabetes mellitus without complications: Secondary | ICD-10-CM

## 2013-11-14 DIAGNOSIS — N179 Acute kidney failure, unspecified: Secondary | ICD-10-CM | POA: Diagnosis present

## 2013-11-14 DIAGNOSIS — IMO0002 Reserved for concepts with insufficient information to code with codable children: Secondary | ICD-10-CM

## 2013-11-14 DIAGNOSIS — R3129 Other microscopic hematuria: Secondary | ICD-10-CM

## 2013-11-14 DIAGNOSIS — R7989 Other specified abnormal findings of blood chemistry: Secondary | ICD-10-CM

## 2013-11-14 DIAGNOSIS — I10 Essential (primary) hypertension: Secondary | ICD-10-CM

## 2013-11-14 DIAGNOSIS — N3000 Acute cystitis without hematuria: Secondary | ICD-10-CM

## 2013-11-14 DIAGNOSIS — K222 Esophageal obstruction: Secondary | ICD-10-CM

## 2013-11-14 DIAGNOSIS — J45909 Unspecified asthma, uncomplicated: Secondary | ICD-10-CM

## 2013-11-14 DIAGNOSIS — R079 Chest pain, unspecified: Secondary | ICD-10-CM

## 2013-11-14 DIAGNOSIS — E669 Obesity, unspecified: Secondary | ICD-10-CM

## 2013-11-14 DIAGNOSIS — B3731 Acute candidiasis of vulva and vagina: Secondary | ICD-10-CM

## 2013-11-14 DIAGNOSIS — R1084 Generalized abdominal pain: Secondary | ICD-10-CM

## 2013-11-14 DIAGNOSIS — E162 Hypoglycemia, unspecified: Secondary | ICD-10-CM

## 2013-11-14 DIAGNOSIS — M129 Arthropathy, unspecified: Secondary | ICD-10-CM | POA: Diagnosis present

## 2013-11-14 DIAGNOSIS — M25569 Pain in unspecified knee: Secondary | ICD-10-CM

## 2013-11-14 DIAGNOSIS — E1069 Type 1 diabetes mellitus with other specified complication: Secondary | ICD-10-CM | POA: Diagnosis present

## 2013-11-14 DIAGNOSIS — F3289 Other specified depressive episodes: Secondary | ICD-10-CM

## 2013-11-14 DIAGNOSIS — N39498 Other specified urinary incontinence: Secondary | ICD-10-CM

## 2013-11-14 DIAGNOSIS — Z885 Allergy status to narcotic agent status: Secondary | ICD-10-CM

## 2013-11-14 MED ORDER — IPRATROPIUM-ALBUTEROL 0.5-2.5 (3) MG/3ML IN SOLN
3.0000 mL | Freq: Once | RESPIRATORY_TRACT | Status: AC
  Administered 2013-11-15: 3 mL via RESPIRATORY_TRACT
  Filled 2013-11-14: qty 3

## 2013-11-14 MED ORDER — SODIUM CHLORIDE 0.9 % IV SOLN
1000.0000 mL | Freq: Once | INTRAVENOUS | Status: AC
  Administered 2013-11-15: 1000 mL via INTRAVENOUS

## 2013-11-14 MED ORDER — SODIUM CHLORIDE 0.9 % IV SOLN
1000.0000 mL | INTRAVENOUS | Status: DC
  Administered 2013-11-15: 1000 mL via INTRAVENOUS

## 2013-11-14 NOTE — ED Notes (Signed)
Pt stated having problem with his blood sugar starting 11/13/13, patient remember checking BS last night and it was 130, took her HS snack and went to bed around 2200. Stated woke up on the floor around 1630 today, thought it was 04:30am and she realized it evening and she was " feeling funny". She checked her blood sugar and it was 237, she ate dinner and rechecked her blood sugar again and it was 267 and she cont to feel funny , sweater and clammy. She called EMS. When EMS got there,as she was walking down her stairs, she started having SOB and wheezing, EMS gave her a neb treatment, Pt has HX os asthma, HTN and DM. Pt still wheezing and breathing heavily.

## 2013-11-14 NOTE — ED Provider Notes (Addendum)
CSN: [id number]     Arrival date & time 11/14/13  2245 History   First MD Initiated Contact with Patient 11/14/13 2313     Chief Complaint  Patient presents with  . Diabetes     (Consider location/radiation/quality/duration/timing/severity/associated sxs/prior Treatment) Patient is a 63 y.o. female presenting with diabetes problem. The history is provided by the patient.  Diabetes  She saw her urologist yesterday who started her on an antibiotic for a urinary tract infection. Last night, she went to bed at 9:30 at which time her sugar was 159. She did not wake up until 4:30 this afternoon and was on the floor at that time. She states she she felt funny and thought her sugar was low but when she checked, her sugar was 279. She ate some thinning and took some insulin and sugar came down to 259. She continued to feel funny. There was slight clamminess. She denies sore throat or cough. She denies fever or chills. She denies nausea, vomiting, diarrhea. She denies urinary difficulty. She a day meal several hours later and took insulin and her sugar came down to 179 but then went back up over 200. At this point, she called for an ambulance. She states that her sugars have generally been under 160 recently. EMS noted some wheezing and gave her a breathing treatment.  Past Medical History  Diagnosis Date  . Diabetes mellitus   . Hypertension   . Arthritis   . Fibromyalgia    Past Surgical History  Procedure Laterality Date  . Cholecystectomy    . Colon surgery    . Esophagus dilated    . Arthroscopic knee surgery    . Carpal tunnel release Bilateral    Family History  Problem Relation Age of Onset  . Diabetes Mother   . Hypertension Mother   . Cancer Mother   . Diabetes Father   . Hypertension Father   . Diabetes Brother    History  Substance Use Topics  . Smoking status: Never Smoker   . Smokeless tobacco: Never Used  . Alcohol Use: No   OB History   Grav Para Term Preterm  Abortions TAB SAB Ect Mult Living                 Review of Systems  All other systems reviewed and are negative.      Allergies  Codeine  Home Medications   Current Outpatient Rx  Name  Route  Sig  Dispense  Refill  . ACCU-CHEK FASTCLIX LANCETS MISC               . albuterol (PROVENTIL HFA;VENTOLIN HFA) 108 (90 BASE) MCG/ACT inhaler   Inhalation   Inhale 2 puffs into the lungs every 6 (six) hours as needed. For shortness of breath         . amLODipine (NORVASC) 10 MG tablet   Oral   Take 10 mg by mouth daily.         [name] aspirin 81 MG chewable tablet   Oral   Chew 81 mg by mouth daily.         [name] azithromycin (ZITHROMAX Z-PAK) 250 MG tablet      2 po day one, then 1 daily x 4 days   5 tablet   0   . Canagliflozin (INVOKANA) 300 MG TABS   Oral   Take 300 mg by mouth daily with breakfast.          . cetirizine (ZYRTEC) 10  MG tablet   Oral   Take 10 mg by mouth daily as needed.          . chlorpheniramine-HYDROcodone (TUSSIONEX PENNKINETIC ER) 10-8 MG/5ML LQCR   Oral   Take 5 mLs by mouth at bedtime as needed for cough.   115 mL   0   . cyclobenzaprine (FLEXERIL) 10 MG tablet   Oral   Take 10 mg by mouth 3 (three) times daily as needed for muscle spasms.         [name] dexlansoprazole (DEXILANT) 60 MG capsule   Oral   Take 60 mg by mouth at bedtime. For acid reflex         . EPIPEN 2-PAK 0.3 MG/0.3ML SOAJ injection               . Fluticasone-Salmeterol (ADVAIR) 250-50 MCG/DOSE AEPB   Inhalation   Inhale 1 puff into the lungs 2 (two) times daily as needed. For shortness of breath         . gabapentin (NEURONTIN) 300 MG capsule   Oral   Take 300 mg by mouth daily. If needed can use up to tid         . glucagon (GLUCAGON EMERGENCY) 1 MG injection   Intravenous   Inject 1 mg into the vein once as needed.   1 each   12   . glucose blood (ACCU-CHEK SMARTVIEW) test strip      Use as instructed to check blood sugars 3 times per  day dx code 250.00   100 each   5   . HYDROcodone-acetaminophen (NORCO) 5-325 MG per tablet   Oral   Take 1 tablet by mouth every 6 (six) hours as needed for moderate pain (Please do not take at the same time as the Tussionex cough syrup.).   15 tablet   0   . hydrOXYzine (ATARAX/VISTARIL) 25 MG tablet   Oral   Take 50 mg by mouth 2 (two) times daily. For itching         . insulin regular human CONCENTRATED (HUMULIN R) 500 UNIT/ML SOLN injection      12 units at breakfast, 9 units at lunch and 12 units at supper   20 mL   3   . LANTUS SOLOSTAR 100 UNIT/ML Solostar Pen               . Liraglutide (VICTOZA) 18 MG/3ML SOLN   Subcutaneous   Inject 1.2 mg into the skin every morning.          [name] losartan-hydrochlorothiazide (HYZAAR) 100-25 MG per tablet   Oral   Take 1 tablet by mouth daily.         . mirabegron ER (MYRBETRIQ) 50 MG TB24 tablet   Oral   Take 50 mg by mouth daily.         . Multiple Vitamin (MULITIVITAMIN WITH MINERALS) TABS   Oral   Take 1 tablet by mouth daily.         [name] oxyCODONE-acetaminophen (PERCOCET) 5-325 MG per tablet      1 to 2 tablets every 6 hours as needed for pain.   20 tablet   0   . potassium chloride SA (K-DUR,KLOR-CON) 20 MEQ tablet   Oral   Take 1 tablet (20 mEq total) by mouth daily.   5 tablet   0   . pravastatin (PRAVACHOL) 40 MG tablet   Oral   Take 40 mg by mouth daily.          [name]  predniSONE (DELTASONE) 50 MG tablet      Take 1 tablet by mouth daily for 4 days.   4 tablet   0   . PRESCRIPTION MEDICATION   Intramuscular   Inject into the muscle every other day. Allergy Injections for itching         . salsalate (DISALCID) 750 MG tablet      2 tabs twice daily with food for arthritis   60 tablet   0   . spironolactone (ALDACTONE) 25 MG tablet   Oral   Take 25 mg by mouth daily.          . TOVIAZ 8 MG TB24 tablet                BP 204/81  Pulse 101  Temp(Src) 98.4 F (36.9 C) (Oral)   Resp 28  Ht 5\' 4"  (1.626 m)  Wt 182 lb (82.555 kg)  BMI 31.22 kg/m2  SpO2 100% Physical Exam  Nursing note and vitals reviewed.  63 year old female, resting comfortably and in no acute distress. Vital signs are significant for tachycardia at a rate 101, tachypnea with respiratory rate of 28, and hypertension with blood pressure 204/81. Oxygen saturation is 100%, which is normal. Head is normocephalic and atraumatic. PERRLA, EOMI. Oropharynx is clear. Neck is nontender and supple without adenopathy or JVD. Back is nontender and there is no CVA tenderness. Lungs have faint expiratory wheezes without rales or rhonchi. Chest is nontender. Heart has regular rate and rhythm without murmur. Abdomen is soft, flat, nontender without masses or hepatosplenomegaly and peristalsis is normoactive. Extremities have no cyanosis or edema, full range of motion is present. Skin is warm and dry without rash. Neurologic: Mental status is normal, cranial nerves are intact, there are no motor or sensory deficits.  ED Course  Procedures (including critical care time) Labs Review Results for orders placed during the hospital encounter of 11/14/13  CBC WITH DIFFERENTIAL      Result Value Ref Range   WBC 14.2 (*) 4.0 - 10.5 K/uL   RBC 4.89  3.87 - 5.11 MIL/uL   Hemoglobin 14.9  12.0 - 15.0 g/dL   HCT 44.0  36.0 - 46.0 %   MCV 90.0  78.0 - 100.0 fL   MCH 30.5  26.0 - 34.0 pg   MCHC 33.9  30.0 - 36.0 g/dL   RDW 12.9  11.5 - 15.5 %   Platelets 297  150 - 400 K/uL   Neutrophils Relative % 83 (*) 43 - 77 %   Neutro Abs 11.8 (*) 1.7 - 7.7 K/uL   Lymphocytes Relative 10 (*) 12 - 46 %   Lymphs Abs 1.4  0.7 - 4.0 K/uL   Monocytes Relative 7  3 - 12 %   Monocytes Absolute 1.0  0.1 - 1.0 K/uL   Eosinophils Relative 0  0 - 5 %   Eosinophils Absolute 0.0  0.0 - 0.7 K/uL   Basophils Relative 0  0 - 1 %   Basophils Absolute 0.0  0.0 - 0.1 K/uL  COMPREHENSIVE METABOLIC PANEL      Result Value Ref Range    Sodium 142  137 - 147 mEq/L   Potassium 3.3 (*) 3.7 - 5.3 mEq/L   Chloride 98  96 - 112 mEq/L   CO2 29  19 - 32 mEq/L   Glucose, Bld 156 (*) 70 - 99 mg/dL   BUN 22  6 - 23 mg/dL   Creatinine, Ser 1.09  0.50 - 1.10 mg/dL   Calcium 9.8  8.4 - 10.5 mg/dL   Total Protein 7.5  6.0 - 8.3 g/dL   Albumin 3.3 (*) 3.5 - 5.2 g/dL   AST 45 (*) 0 - 37 U/L   ALT 23  0 - 35 U/L   Alkaline Phosphatase 96  39 - 117 U/L   Total Bilirubin 0.2 (*) 0.3 - 1.2 mg/dL   GFR calc non Af Amer 54 (*) >90 mL/min   GFR calc Af Amer 62 (*) >90 mL/min  URINALYSIS, ROUTINE W REFLEX MICROSCOPIC      Result Value Ref Range   Color, Urine YELLOW  YELLOW   APPearance TURBID (*) CLEAR   Specific Gravity, Urine 1.027  1.005 - 1.030   pH 5.5  5.0 - 8.0   Glucose, UA 250 (*) NEGATIVE mg/dL   Hgb urine dipstick LARGE (*) NEGATIVE   Bilirubin Urine NEGATIVE  NEGATIVE   Ketones, ur 15 (*) NEGATIVE mg/dL   Protein, ur 100 (*) NEGATIVE mg/dL   Urobilinogen, UA 0.2  0.0 - 1.0 mg/dL   Nitrite NEGATIVE  NEGATIVE   Leukocytes, UA SMALL (*) NEGATIVE  CK      Result Value Ref Range   Total CK 1720 (*) 7 - 177 U/L  URINE MICROSCOPIC-ADD ON      Result Value Ref Range   Squamous Epithelial / LPF MANY (*) RARE   WBC, UA 7-10  <3 WBC/hpf   RBC / HPF 3-6  <3 RBC/hpf   Bacteria, UA FEW (*) RARE   Casts HYALINE CASTS (*) NEGATIVE   Crystals CA OXALATE CRYSTALS (*) NEGATIVE  I-STAT CG4 LACTIC ACID, ED      Result Value Ref Range   Lactic Acid, Venous 3.68 (*) 0.5 - 2.2 mmol/L   MDM   Final diagnoses:  Rhabdomyolysis  Elevated lactic acid level    Malaise with mildly elevated blood sugars. This may be manifestations of her urinary tract infection. She'll be given a second albuterol nebulizer treatment with ipratropium and chest x-ray or be obtained as well as screening labs. All records are reviewed and her blood sugars improved dramatically with starting an insulin pump about 3 months ago.  Laboratory evaluation is  significant for elevated lactic acid and elevated CK indicating some degree of rhabdomyolysis related to the time she was laying on the floor. She's given additional IV hydration. Case is discussed with Dr. Arnoldo Morale of [facility] who agrees to admit the patient. Lactic acid level be repeated to see if she is clearing it.  Delora Fuel, MD 47/82/95 6213  Repeat lactic acid level is normal.  Delora Fuel, MD 08/65/78 4696

## 2013-11-15 ENCOUNTER — Encounter (HOSPITAL_COMMUNITY): Payer: Self-pay | Admitting: *Deleted

## 2013-11-15 ENCOUNTER — Emergency Department (HOSPITAL_COMMUNITY): Payer: PRIVATE HEALTH INSURANCE

## 2013-11-15 DIAGNOSIS — M6282 Rhabdomyolysis: Secondary | ICD-10-CM

## 2013-11-15 DIAGNOSIS — IMO0001 Reserved for inherently not codable concepts without codable children: Secondary | ICD-10-CM

## 2013-11-15 DIAGNOSIS — E119 Type 2 diabetes mellitus without complications: Secondary | ICD-10-CM

## 2013-11-15 DIAGNOSIS — E876 Hypokalemia: Secondary | ICD-10-CM | POA: Diagnosis present

## 2013-11-15 DIAGNOSIS — E785 Hyperlipidemia, unspecified: Secondary | ICD-10-CM

## 2013-11-15 DIAGNOSIS — E1165 Type 2 diabetes mellitus with hyperglycemia: Secondary | ICD-10-CM

## 2013-11-15 DIAGNOSIS — E162 Hypoglycemia, unspecified: Secondary | ICD-10-CM | POA: Diagnosis present

## 2013-11-15 DIAGNOSIS — I10 Essential (primary) hypertension: Secondary | ICD-10-CM

## 2013-11-15 DIAGNOSIS — E872 Acidosis, unspecified: Secondary | ICD-10-CM

## 2013-11-15 DIAGNOSIS — R7989 Other specified abnormal findings of blood chemistry: Secondary | ICD-10-CM

## 2013-11-15 HISTORY — DX: Rhabdomyolysis: M62.82

## 2013-11-15 LAB — COMPREHENSIVE METABOLIC PANEL
ALT: 23 U/L (ref 0–35)
AST: 45 U/L — ABNORMAL HIGH (ref 0–37)
Albumin: 3.3 g/dL — ABNORMAL LOW (ref 3.5–5.2)
Alkaline Phosphatase: 96 U/L (ref 39–117)
BUN: 22 mg/dL (ref 6–23)
CO2: 29 meq/L (ref 19–32)
CREATININE: 1.09 mg/dL (ref 0.50–1.10)
Calcium: 9.8 mg/dL (ref 8.4–10.5)
Chloride: 98 mEq/L (ref 96–112)
GFR, EST AFRICAN AMERICAN: 62 mL/min — AB (ref >90)
GFR, EST NON AFRICAN AMERICAN: 54 mL/min — AB (ref >90)
GLUCOSE: 156 mg/dL — AB (ref 70–99)
Potassium: 3.3 mEq/L — ABNORMAL LOW (ref 3.7–5.3)
Sodium: 142 mEq/L (ref 137–147)
Total Bilirubin: 0.2 mg/dL — ABNORMAL LOW (ref 0.3–1.2)
Total Protein: 7.5 g/dL (ref 6.0–8.3)

## 2013-11-15 LAB — BASIC METABOLIC PANEL
BUN: 16 mg/dL (ref 6–23)
CALCIUM: 8.9 mg/dL (ref 8.4–10.5)
CO2: 25 meq/L (ref 19–32)
Chloride: 104 mEq/L (ref 96–112)
Creatinine, Ser: 0.99 mg/dL (ref 0.50–1.10)
GFR calc Af Amer: 70 mL/min — ABNORMAL LOW (ref >90)
GFR calc non Af Amer: 60 mL/min — ABNORMAL LOW (ref >90)
GLUCOSE: 110 mg/dL — AB (ref 70–99)
POTASSIUM: 4.2 meq/L (ref 3.7–5.3)
SODIUM: 142 meq/L (ref 137–147)

## 2013-11-15 LAB — GLUCOSE, CAPILLARY
GLUCOSE-CAPILLARY: 159 mg/dL — AB (ref 70–99)
GLUCOSE-CAPILLARY: 118 mg/dL — AB (ref 70–99)
GLUCOSE-CAPILLARY: 118 mg/dL — AB (ref 70–99)
GLUCOSE-CAPILLARY: 43 mg/dL — AB (ref 70–99)
Glucose-Capillary: 83 mg/dL (ref 70–99)
Glucose-Capillary: 24 mg/dL — CL (ref 70–99)
Glucose-Capillary: 137 mg/dL — ABNORMAL HIGH (ref 70–99)
Glucose-Capillary: 133 mg/dL — ABNORMAL HIGH (ref 70–99)

## 2013-11-15 LAB — I-STAT CG4 LACTIC ACID, ED
Lactic Acid, Venous: 1.93 mmol/L (ref 0.5–2.2)
Lactic Acid, Venous: 3.68 mmol/L — ABNORMAL HIGH (ref 0.5–2.2)

## 2013-11-15 LAB — URINALYSIS, ROUTINE W REFLEX MICROSCOPIC
BILIRUBIN URINE: NEGATIVE
Glucose, UA: 250 mg/dL — AB
KETONES UR: 15 mg/dL — AB
NITRITE: NEGATIVE
Protein, ur: 100 mg/dL — AB
SPECIFIC GRAVITY, URINE: 1.027 (ref 1.005–1.030)
UROBILINOGEN UA: 0.2 mg/dL (ref 0.0–1.0)
pH: 5.5 (ref 5.0–8.0)

## 2013-11-15 LAB — CBC WITH DIFFERENTIAL/PLATELET
Basophils Absolute: 0 10*3/uL (ref 0.0–0.1)
Basophils Relative: 0 % (ref 0–1)
EOS PCT: 0 % (ref 0–5)
Eosinophils Absolute: 0 10*3/uL (ref 0.0–0.7)
HCT: 44 % (ref 36.0–46.0)
HEMOGLOBIN: 14.9 g/dL (ref 12.0–15.0)
LYMPHS ABS: 1.4 10*3/uL (ref 0.7–4.0)
Lymphocytes Relative: 10 % — ABNORMAL LOW (ref 12–46)
MCH: 30.5 pg (ref 26.0–34.0)
MCHC: 33.9 g/dL (ref 30.0–36.0)
MCV: 90 fL (ref 78.0–100.0)
MONO ABS: 1 10*3/uL (ref 0.1–1.0)
Monocytes Relative: 7 % (ref 3–12)
Neutro Abs: 11.8 10*3/uL — ABNORMAL HIGH (ref 1.7–7.7)
Neutrophils Relative %: 83 % — ABNORMAL HIGH (ref 43–77)
PLATELETS: 297 10*3/uL (ref 150–400)
RBC: 4.89 MIL/uL (ref 3.87–5.11)
RDW: 12.9 % (ref 11.5–15.5)
WBC: 14.2 10*3/uL — ABNORMAL HIGH (ref 4.0–10.5)

## 2013-11-15 LAB — CK
CK TOTAL: 3139 U/L — AB (ref 7–177)
CK TOTAL: 1720 U/L — AB (ref 7–177)

## 2013-11-15 LAB — URINE MICROSCOPIC-ADD ON

## 2013-11-15 LAB — CBC
HCT: 41.5 % (ref 36.0–46.0)
HEMOGLOBIN: 14 g/dL (ref 12.0–15.0)
MCH: 30.4 pg (ref 26.0–34.0)
MCHC: 33.7 g/dL (ref 30.0–36.0)
MCV: 90 fL (ref 78.0–100.0)
Platelets: 274 10*3/uL (ref 150–400)
RBC: 4.61 MIL/uL (ref 3.87–5.11)
RDW: 13.2 % (ref 11.5–15.5)
WBC: 12.7 10*3/uL — ABNORMAL HIGH (ref 4.0–10.5)

## 2013-11-15 LAB — CK TOTAL AND CKMB (NOT AT ARMC)
CK TOTAL: 2850 U/L — AB (ref 7–177)
CK, MB: 14.5 ng/mL — AB (ref 0.3–4.0)
RELATIVE INDEX: 0.5 (ref 0.0–2.5)

## 2013-11-15 LAB — CBG MONITORING, ED: GLUCOSE-CAPILLARY: 111 mg/dL — AB (ref 70–99)

## 2013-11-15 MED ORDER — INSULIN PUMP: 1.0000 | SUBCUTANEOUS | Status: DC

## 2013-11-15 MED ORDER — OXYCODONE HCL 5 MG PO TABS: 5.0000 mg | ORAL_TABLET | ORAL | Status: DC | PRN

## 2013-11-15 MED ORDER — CYCLOBENZAPRINE HCL 10 MG PO TABS: 10.0000 mg | ORAL_TABLET | Freq: Three times a day (TID) | ORAL | Status: DC | PRN

## 2013-11-15 MED ORDER — AZITHROMYCIN 250 MG PO TABS
250.0000 mg | ORAL_TABLET | Freq: Every day | ORAL | Status: DC
  Administered 2013-11-15 – 2013-11-16 (×2): 250 mg via ORAL
  Filled 2013-11-15 (×2): qty 1

## 2013-11-15 MED ORDER — ALUM & MAG HYDROXIDE-SIMETH 200-200-20 MG/5ML PO SUSP
30.0000 mL | Freq: Four times a day (QID) | ORAL | Status: DC | PRN
Start: 2013-11-15 — End: 2013-11-16

## 2013-11-15 MED ORDER — NITROFURANTOIN MACROCRYSTAL 100 MG PO CAPS
100.0000 mg | ORAL_CAPSULE | Freq: Two times a day (BID) | ORAL | Status: DC
  Filled 2013-11-15 (×2): qty 1

## 2013-11-15 MED ORDER — INSULIN ASPART 100 UNIT/ML ~~LOC~~ SOLN: 0.0000 [IU] | Freq: Three times a day (TID) | SUBCUTANEOUS | Status: DC

## 2013-11-15 MED ORDER — LIRAGLUTIDE 18 MG/3ML ~~LOC~~ SOPN: 1.2000 mg | PEN_INJECTOR | Freq: Every day | SUBCUTANEOUS | Status: DC

## 2013-11-15 MED ORDER — SIMVASTATIN 20 MG PO TABS: 20.0000 mg | ORAL_TABLET | Freq: Every day | ORAL | Status: DC

## 2013-11-15 MED ORDER — HYDROCHLOROTHIAZIDE 12.5 MG PO CAPS
12.5000 mg | ORAL_CAPSULE | Freq: Every day | ORAL | Status: DC
  Administered 2013-11-15 – 2013-11-16 (×2): 12.5 mg via ORAL
  Filled 2013-11-15 (×2): qty 1

## 2013-11-15 MED ORDER — AMLODIPINE BESYLATE 10 MG PO TABS
10.0000 mg | ORAL_TABLET | Freq: Every day | ORAL | Status: DC
  Administered 2013-11-15 – 2013-11-16 (×2): 10 mg via ORAL
  Filled 2013-11-15 (×2): qty 1

## 2013-11-15 MED ORDER — ALBUTEROL SULFATE (2.5 MG/3ML) 0.083% IN NEBU: 2.5000 mg | INHALATION_SOLUTION | Freq: Four times a day (QID) | RESPIRATORY_TRACT | Status: DC | PRN

## 2013-11-15 MED ORDER — GABAPENTIN 300 MG PO CAPS
300.0000 mg | ORAL_CAPSULE | Freq: Two times a day (BID) | ORAL | Status: DC | PRN
  Filled 2013-11-15: qty 1

## 2013-11-15 MED ORDER — SODIUM CHLORIDE 0.9 % IV SOLN
INTRAVENOUS | Status: DC
  Administered 2013-11-15: 06:00:00 via INTRAVENOUS

## 2013-11-15 MED ORDER — ASPIRIN 81 MG PO CHEW
81.0000 mg | CHEWABLE_TABLET | Freq: Every day | ORAL | Status: DC
  Administered 2013-11-15 – 2013-11-16 (×2): 81 mg via ORAL
  Filled 2013-11-15 (×2): qty 1

## 2013-11-15 MED ORDER — COLCHICINE 0.6 MG PO TABS
0.6000 mg | ORAL_TABLET | Freq: Two times a day (BID) | ORAL | Status: DC
  Administered 2013-11-15 – 2013-11-16 (×3): 0.6 mg via ORAL
  Filled 2013-11-15 (×4): qty 1

## 2013-11-15 MED ORDER — SODIUM CHLORIDE 0.9 % IV SOLN: INTRAVENOUS | Status: DC

## 2013-11-15 MED ORDER — SODIUM CHLORIDE 0.9 % IJ SOLN
3.0000 mL | Freq: Two times a day (BID) | INTRAMUSCULAR | Status: DC
  Administered 2013-11-15: 3 mL via INTRAVENOUS

## 2013-11-15 MED ORDER — POTASSIUM CHLORIDE CRYS ER 20 MEQ PO TBCR
20.0000 meq | EXTENDED_RELEASE_TABLET | Freq: Every day | ORAL | Status: DC
  Administered 2013-11-15 – 2013-11-16 (×2): 20 meq via ORAL
  Filled 2013-11-15 (×2): qty 1

## 2013-11-15 MED ORDER — ACETAMINOPHEN 650 MG RE SUPP: 650.0000 mg | Freq: Four times a day (QID) | RECTAL | Status: DC | PRN

## 2013-11-15 MED ORDER — LORATADINE 10 MG PO TABS
10.0000 mg | ORAL_TABLET | Freq: Every day | ORAL | Status: DC
  Administered 2013-11-15 – 2013-11-16 (×2): 10 mg via ORAL
  Filled 2013-11-15 (×2): qty 1

## 2013-11-15 MED ORDER — SODIUM CHLORIDE 0.9 % IV SOLN
1000.0000 mL | INTRAVENOUS | Status: DC
  Administered 2013-11-15: 1000 mL via INTRAVENOUS

## 2013-11-15 MED ORDER — PANTOPRAZOLE SODIUM 40 MG PO TBEC
40.0000 mg | DELAYED_RELEASE_TABLET | Freq: Every day | ORAL | Status: DC
  Administered 2013-11-15 – 2013-11-16 (×2): 40 mg via ORAL
  Filled 2013-11-15 (×2): qty 1

## 2013-11-15 MED ORDER — INSULIN PUMP
SUBCUTANEOUS | Status: DC
  Administered 2013-11-15: 07:00:00 via SUBCUTANEOUS
  Administered 2013-11-15: 3 via SUBCUTANEOUS
  Administered 2013-11-15: 2.6 via SUBCUTANEOUS
  Filled 2013-11-15: qty 1

## 2013-11-15 MED ORDER — ONDANSETRON HCL 4 MG/2ML IJ SOLN
4.0000 mg | Freq: Four times a day (QID) | INTRAMUSCULAR | Status: DC | PRN
Start: 2013-11-15 — End: 2013-11-16

## 2013-11-15 MED ORDER — DEXTROSE-NACL 5-0.9 % IV SOLN
INTRAVENOUS | Status: DC
  Administered 2013-11-15 – 2013-11-16 (×3): via INTRAVENOUS

## 2013-11-15 MED ORDER — INSULIN ASPART 100 UNIT/ML ~~LOC~~ SOLN
3.0000 [IU] | Freq: Three times a day (TID) | SUBCUTANEOUS | Status: DC
  Administered 2013-11-16: 3 [IU] via SUBCUTANEOUS

## 2013-11-15 MED ORDER — DOCUSATE SODIUM 100 MG PO CAPS
100.0000 mg | ORAL_CAPSULE | Freq: Two times a day (BID) | ORAL | Status: DC | PRN
Start: 2013-11-15 — End: 2013-11-16
  Filled 2013-11-15: qty 1

## 2013-11-15 MED ORDER — INSULIN DETEMIR 100 UNIT/ML ~~LOC~~ SOLN
10.0000 [IU] | Freq: Two times a day (BID) | SUBCUTANEOUS | Status: DC
  Filled 2013-11-15 (×3): qty 0.1

## 2013-11-15 MED ORDER — POTASSIUM CHLORIDE CRYS ER 20 MEQ PO TBCR
40.0000 meq | EXTENDED_RELEASE_TABLET | Freq: Once | ORAL | Status: AC
  Administered 2013-11-15: 40 meq via ORAL
  Filled 2013-11-15: qty 2

## 2013-11-15 MED ORDER — LIRAGLUTIDE 18 MG/3ML ~~LOC~~ SOLN: 1.2000 mg | Freq: Every morning | SUBCUTANEOUS | Status: DC

## 2013-11-15 MED ORDER — ADULT MULTIVITAMIN W/MINERALS CH
1.0000 | ORAL_TABLET | Freq: Every day | ORAL | Status: DC
  Administered 2013-11-15 – 2013-11-16 (×2): 1 via ORAL
  Filled 2013-11-15 (×2): qty 1

## 2013-11-15 MED ORDER — DEXTROSE 50 % IV SOLN: 50.0000 mL | Freq: Once | INTRAVENOUS | Status: AC | PRN

## 2013-11-15 MED ORDER — SODIUM CHLORIDE 0.9 % IV SOLN
INTRAVENOUS | Status: DC
  Administered 2013-11-15: 1000 mL via INTRAVENOUS

## 2013-11-15 MED ORDER — HYDROMORPHONE HCL PF 1 MG/ML IJ SOLN: 0.5000 mg | INTRAMUSCULAR | Status: DC | PRN

## 2013-11-15 MED ORDER — ONDANSETRON HCL 4 MG PO TABS: 4.0000 mg | ORAL_TABLET | Freq: Four times a day (QID) | ORAL | Status: DC | PRN

## 2013-11-15 MED ORDER — INSULIN ASPART 100 UNIT/ML ~~LOC~~ SOLN: 0.0000 [IU] | Freq: Every day | SUBCUTANEOUS | Status: DC

## 2013-11-15 MED ORDER — SPIRONOLACTONE 25 MG PO TABS
25.0000 mg | ORAL_TABLET | Freq: Every day | ORAL | Status: DC
  Administered 2013-11-15 – 2013-11-16 (×2): 25 mg via ORAL
  Filled 2013-11-15 (×2): qty 1

## 2013-11-15 MED ORDER — ACETAMINOPHEN 325 MG PO TABS: 650.0000 mg | ORAL_TABLET | Freq: Four times a day (QID) | ORAL | Status: DC | PRN

## 2013-11-15 MED ORDER — SODIUM CHLORIDE 0.9 % IV SOLN
1000.0000 mL | Freq: Once | INTRAVENOUS | Status: AC
  Administered 2013-11-15: 1000 mL via INTRAVENOUS

## 2013-11-15 MED ORDER — DEXTROSE 50 % IV SOLN
INTRAVENOUS | Status: AC
  Administered 2013-11-15: 50 mL
  Filled 2013-11-15: qty 50

## 2013-11-15 MED ORDER — MIRABEGRON ER 50 MG PO TB24
50.0000 mg | ORAL_TABLET | Freq: Every day | ORAL | Status: DC | PRN
  Filled 2013-11-15: qty 1

## 2013-11-15 MED ORDER — PRAVASTATIN SODIUM 40 MG PO TABS
40.0000 mg | ORAL_TABLET | Freq: Every day | ORAL | Status: DC
  Administered 2013-11-15 – 2013-11-16 (×2): 40 mg via ORAL
  Filled 2013-11-15 (×2): qty 1

## 2013-11-15 MED ORDER — AMLODIPINE BESYLATE 10 MG PO TABS
10.0000 mg | ORAL_TABLET | Freq: Once | ORAL | Status: AC
  Administered 2013-11-15: 10 mg via ORAL
  Filled 2013-11-15: qty 1

## 2013-11-15 MED ORDER — HYDROXYZINE HCL 25 MG PO TABS
50.0000 mg | ORAL_TABLET | Freq: Two times a day (BID) | ORAL | Status: DC | PRN
  Filled 2013-11-15: qty 2

## 2013-11-15 NOTE — Progress Notes (Signed)
Utilization review completed.  

## 2013-11-15 NOTE — ED Notes (Addendum)
Dr Roxanne Mins given a copy of lactic acid results 3.68

## 2013-11-15 NOTE — Significant Event (Signed)
Patient cbg at 98 , Patient A&O4, very sweaty but awake, D 50 IV 1 AMP given stat . Dr Venetia Constable paged.

## 2013-11-15 NOTE — ED Notes (Signed)
EDP at bedside discussing plan of care.

## 2013-11-15 NOTE — Progress Notes (Signed)
[facility] PROGRESS NOTE Interim History: 63 y.o. female with Type I DM since age 25, who has had placement of an Insulin pump 3 months ago who presents to the ED with complaints of waking up on the floor in the AM today. She reports that she did not know how she ended up on the floor , but she got into bed at 9:30pm and awakened at 4:30 AM on the Floor    Assessment/Plan: Rhabdomyolysis: - most likely due to hypoglycemia - CK pending previous < 5k. - Cr improved.   Elevated lactic acid level  Hypoglycemia/ DIABETES MELLITUS: - None in-house. - cont insulin pump.   Hypokalemia - resolved.  HYPERTENSION, BENIGN ESSENTIAL - cont home medications.     Code Status: full Family Communication: noen  Disposition Plan:inpatient   Consultants:  none  Procedures:  none  Antibiotics:  *none  HPI/Subjective: No complains feels better wants to go home.  Objective: Filed Vitals:   11/15/13 0400 11/15/13 0415 11/15/13 0521 11/15/13 0900  BP: 176/87 169/77 180/90 151/63  Pulse: 94 92 91 94  Temp:   98.4 F (36.9 C) 98.3 F (36.8 C)  TempSrc:   Oral Oral  Resp:   20 20  Height:   5\' 4"  (1.626 m)   Weight:   87.68 kg (193 lb 4.8 oz)   SpO2: 99% 100% 99% 98%    Intake/Output Summary (Last 24 hours) at 11/15/13 0944 Last data filed at 11/15/13 0900  Gross per 24 hour  Intake    240 ml  Output    500 ml  Net   -260 ml   Filed Weights   11/14/13 2304 11/15/13 0521  Weight: 82.555 kg (182 lb) 87.68 kg (193 lb 4.8 oz)    Exam:  General: Alert, awake, oriented x3, in no acute distress.  HEENT: No bruits, no goiter.  Heart: Regular rate and rhythm, without murmurs, rubs, gallops.  Lungs: Good air movement, bilateral air movement.  Abdomen: Soft, nontender, nondistended, positive bowel sounds.  Neuro: Grossly intact, nonfocal.   Data Reviewed: Basic Metabolic Panel:  Recent Labs Lab 11/13/13 1617 11/15/13 0005 11/15/13 0803  NA 138 142 142    K 3.7 3.3* 4.2  CL 99 98 104  CO2 29 29 25   GLUCOSE 259* 156* 110*  BUN 17 22 16   CREATININE 1.3* 1.09 0.99  CALCIUM 9.3 9.8 8.9   Liver Function Tests:  Recent Labs Lab 11/13/13 1617 11/15/13 0005  AST 22 45*  ALT 20 23  ALKPHOS 88 96  BILITOT 0.5 0.2*  PROT 7.3 7.5  ALBUMIN 3.5 3.3*   No results found for this basename: LIPASE, AMYLASE,  in the last 168 hours No results found for this basename: AMMONIA,  in the last 168 hours CBC:  Recent Labs Lab 11/15/13 0005 11/15/13 0803  WBC 14.2* 12.7*  NEUTROABS 11.8*  --   HGB 14.9 14.0  HCT 44.0 41.5  MCV 90.0 90.0  PLT 297 274   Cardiac Enzymes:  Recent Labs Lab 11/15/13 0005  CKTOTAL 1720*   BNP (last 3 results) No results found for this basename: PROBNP,  in the last 8760 hours CBG:  Recent Labs Lab 11/15/13 0429 11/15/13 0608 11/15/13 0741  GLUCAP 111* 159* 118*    No results found for this or any previous visit (from the past 240 hour(s)).   Studies: Dg Chest 2 View  11/15/2013   CLINICAL DATA:  Hypotension, causing the patient to lose consciousness earlier tonight.  EXAM: CHEST  2 VIEW  COMPARISON:  DG CHEST 2 VIEW dated 09/23/2013; CT ANGIO CHEST W/CM &/OR WO/CM dated 06/12/2012; DG CHEST 2 VIEW dated 06/12/2012; DG CHEST 2 VIEW dated 12/18/2011  FINDINGS: Cardiac silhouette normal in size, unchanged. Thoracic aorta mildly atherosclerotic, unchanged. Hilar and mediastinal contours otherwise unremarkable. Lungs clear. Bronchovascular markings normal. Pulmonary vascularity normal. No visible pleural effusions. No pneumothorax. Degenerative changes involving the thoracic spine. No significant interval change.  IMPRESSION: No acute cardiopulmonary disease. Stable examination.   Electronically Signed   By: Evangeline Dakin M.D.   On: 11/15/2013 02:24    Scheduled Meds: . amLODipine  10 mg Oral Daily  . aspirin  81 mg Oral Daily  . azithromycin  250 mg Oral Daily  . colchicine  0.6 mg Oral BID  . insulin  pump   Subcutaneous 6 times per day  . [START ON 11/16/2013] Liraglutide  1.2 mg Subcutaneous Q breakfast  . loratadine  10 mg Oral Daily  . multivitamin with minerals  1 tablet Oral Daily  . nitrofurantoin  100 mg Oral BID  . pantoprazole  40 mg Oral Daily  . potassium chloride SA  20 mEq Oral Daily  . pravastatin  40 mg Oral q1800  . sodium chloride  3 mL Intravenous Q12H  . spironolactone  25 mg Oral Daily   Continuous Infusions: . sodium chloride Stopped (11/15/13 0259)  . sodium chloride 1,000 mL (11/15/13 0402)  . sodium chloride 75 mL/hr at 11/15/13 0543     Charlynne Cousins  [facility] Pager [phone]. If 8PM-8AM, please contact night-coverage at [email or web address], password [facility] 11/15/2013, 9:44 AM  LOS: 1 day

## 2013-11-15 NOTE — Care Management Note (Deleted)
  Paged secondary to patient having frequent hypoglycemic episodes      A/P   Rhabdomyolysis -Start D5 normal saline 194ml/hr -Obtain CK q 4hr , last CK 3139 at 1145 on 3/22    Hypoglycemia frequent episode -Patient had CBG of 24, and another episode of 43.  Instructed RN Ronalee Belts to DC normal saline and start D5 normal saline 154ml/hr. -Obtain CBGq3hr -Obtain CK q 4hr

## 2013-11-15 NOTE — ED Notes (Signed)
Pt ambulated to bathroom 

## 2013-11-15 NOTE — H&P (Signed)
[facility] History and Physical  [name] DUK:[id number] DOB: 05-15-1951 DOA: 11/14/2013  Referring physician:  EDP PCP: [name], MD  Specialists:   Chief Complaint: Woke up on the Floor  HPI: [name] is a 63 y.o. female with Type I DM since age 67, who has had placement of an Insulin pump 3 months ago who presents to the ED with complaints of waking up on the floor in the AM today.  She reports that she did not know how she ended up on the floor , but she got into bed at 9:30pm and awakened at 4:30 AM on the Floor.   She also did not know how long she had been on the floor and she denies feeling any pain or discomfort.    She reports that when she awakened she did feel "funny "  Shaky as if her blood sugar was running low, and she got up and checked her blood sugar and found it to be 139, and she ate something and then injected her U-100 regular insulin after she had calculated her carbohydrates, and she reports feeling shaky and as if she would faint.  She see Dr. [name] Endocrinologist and had recently been placed on the U-100 Insulin because she had glucose levels in the 300's.  She reports since she has been taking the U-100 she has had more episodes of her sugars bottoming out.   She reports that she eats on a regulare schedule and has snacks in between as well.     She denies having any fevers or chills. In the ED, she was found to have a CPK level of 1730, and a lactic Acid level of 3.68.  She was referred for medical admission.         Review of Systems:  Constitutional: No Weight Loss, No Weight Gain, Night Sweats, Fevers, Chills, Fatigue, or Generalized Weakness HEENT: No Headaches, Difficulty Swallowing,Tooth/Dental Problems,Sore Throat,  No Sneezing, Rhinitis, Ear Ache, Nasal Congestion, or Post Nasal Drip,  Cardio-vascular:  No Chest pain, Orthopnea, PND, Edema in lower extremities, Anasarca, Dizziness, Palpitations  Resp: No Dyspnea, No DOE, No  Productive Cough, No Non-Productive Cough, No Hemoptysis, No Change in Color of Mucus,  No Wheezing.    GI: No Heartburn, Indigestion, Abdominal Pain, Nausea, Vomiting, Diarrhea, Change in Bowel Habits,  Loss of Appetite  GU: No Dysuria, Change in Color of Urine, No Urgency or Frequency.  No flank pain.  Musculoskeletal: No Joint Pain or Swelling.  No Decreased Range of Motion. No Back Pain.  Neurologic:   +Syncope, No Seizures, Muscle Weakness, Paresthesia, Vision Disturbance or Loss, No Diplopia, No Vertigo, No Difficulty Walking,  Skin: No Rash or Lesions. Psych: No Change in Mood or Affect. No Depression or Anxiety. No Memory loss. No Confusion or Hallucinations   Past Medical History  Diagnosis Date  . Diabetes mellitus   . Hypertension   . Arthritis   . Fibromyalgia       Past Surgical History  Procedure Laterality Date  . Cholecystectomy    . Colon surgery    . Esophagus dilated    . Arthroscopic knee surgery    . Carpal tunnel release Bilateral        Prior to Admission medications   Medication Sig Start Date End Date Taking? Authorizing Provider  ACCU-CHEK FASTCLIX LANCETS [place]  07/31/13  Yes Historical Provider, MD  albuterol (PROVENTIL HFA;VENTOLIN HFA) 108 (90 BASE) MCG/ACT inhaler Inhale 2 puffs into the lungs every  6 (six) hours as needed for wheezing or shortness of breath. For shortness of breath   Yes Historical Provider, MD  amLODipine (NORVASC) 10 MG tablet Take 10 mg by mouth daily.   Yes Historical Provider, MD  aspirin 81 MG chewable tablet Chew 81 mg by mouth daily.   Yes Historical Provider, MD  azithromycin (ZITHROMAX) 250 MG tablet Take 250-500 mg by mouth See admin instructions. 2 tablets by mouth once and then 1 tablet daily x 4 days 11/13/13  Yes Blanchard Kelch, MD  cetirizine (ZYRTEC) 10 MG tablet Take 10 mg by mouth daily as needed for allergies.    Yes Historical Provider, MD  colchicine 0.6 MG tablet Take 0.6 mg by mouth 2 (two) times daily.    Yes Historical Provider, MD  cyclobenzaprine (FLEXERIL) 10 MG tablet Take 10 mg by mouth 3 (three) times daily as needed for muscle spasms.   Yes Historical Provider, MD  dexlansoprazole (DEXILANT) 60 MG capsule Take 60 mg by mouth at bedtime. For acid reflex   Yes Historical Provider, MD  docusate sodium (COLACE) 100 MG capsule Take 100 mg by mouth 2 (two) times daily as needed for mild constipation.   Yes Historical Provider, MD  EPIPEN 2-PAK 0.3 MG/0.3ML SOAJ injection Inject 0.3 mg into the muscle once.  06/03/13  Yes Historical Provider, MD  gabapentin (NEURONTIN) 300 MG capsule Take 300 mg by mouth 2 (two) times daily as needed (for pain). If needed can use up to tid   Yes Historical Provider, MD  glucagon (GLUCAGON EMERGENCY) 1 MG injection Inject 1 mg into the vein once as needed. 07/29/13  Yes Elayne Snare, MD  glucose blood (ACCU-CHEK SMARTVIEW) test strip Use as instructed to check blood sugars 3 times per day dx code 250.00 09/01/13  Yes Elayne Snare, MD  hydrOXYzine (ATARAX/VISTARIL) 25 MG tablet Take 50 mg by mouth 2 (two) times daily as needed for itching. For itching   Yes Historical Provider, MD  Insulin Human (INSULIN PUMP) SOLN Inject 1 each into the skin continuous. HUMULIN R 500 CONCENTRATED IN PUMP   Yes Historical Provider, MD  Liraglutide (VICTOZA) 18 MG/3ML SOLN Inject 1.2 mg into the skin every morning.    Yes Historical Provider, MD  losartan-hydrochlorothiazide (HYZAAR) 100-25 MG per tablet Take 1 tablet by mouth daily.   Yes Historical Provider, MD  meloxicam (MOBIC) 15 MG tablet Take 15 mg by mouth daily as needed for pain.   Yes Historical Provider, MD  methylPREDNISolone (MEDROL DOSEPAK) 4 MG tablet Take 4-32 mg by mouth See admin instructions. Taper dose.  follow package directions 11/12/13  Yes Historical Provider, MD  mirabegron ER (MYRBETRIQ) 50 MG TB24 tablet Take 50 mg by mouth daily as needed (for bladder).    Yes Historical Provider, MD  Multiple Vitamin (MULITIVITAMIN  WITH MINERALS) TABS Take 1 tablet by mouth daily.   Yes Historical Provider, MD  nitrofurantoin (MACRODANTIN) 100 MG capsule Take 100 mg by mouth 2 (two) times daily. 11/09/13  Yes Historical Provider, MD  oxyCODONE-acetaminophen (PERCOCET/ROXICET) 5-325 MG per tablet 1-2 tablets every 6 (six) hours as needed for moderate pain. 1 to 2 tablets every 6 hours as needed for pain. 10/26/13  Yes Harden Mo, MD  potassium chloride SA (K-DUR,KLOR-CON) 20 MEQ tablet Take 1 tablet (20 mEq total) by mouth daily. 05/02/13  Yes Madaline Brilliant, MD  pravastatin (PRAVACHOL) 40 MG tablet Take 40 mg by mouth daily.    Yes Historical Provider, MD  PRESCRIPTION MEDICATION  Inject into the muscle every other day. Allergy Injections for itching   Yes Historical Provider, MD  PRESCRIPTION MEDICATION Take 1 tablet by mouth daily as needed (for urinary). URICALM TABLET   Yes Historical Provider, MD  salsalate (DISALCID) 750 MG tablet Take 1,500 mg by mouth 2 (two) times daily. 2 tabs twice daily with food for arthritis 10/26/13  Yes Harden Mo, MD  spironolactone (ALDACTONE) 25 MG tablet Take 25 mg by mouth daily.    Yes Historical Provider, MD  trimethoprim (TRIMPEX) 100 MG tablet Take 100 mg by mouth daily.  11/05/13  Yes Historical Provider, MD      Allergies  Allergen Reactions  . Codeine Itching and Nausea And Vomiting     Social History:  Lives with Her sister,     reports that she has never smoked. She has never used smokeless tobacco. She reports that she does not drink alcohol or use illicit drugs.     Family History  Problem Relation Age of Onset  . Diabetes Mother   . Hypertension Mother   . Cancer Mother   . Diabetes Father   . Hypertension Father   . Diabetes Brother        Physical Exam:  GEN:  Pleasant  Obese  63 y.o.   African American female  examined  and in no acute distress; cooperative with exam Filed Vitals:   11/15/13 0345 11/15/13 0400 11/15/13 0415 11/15/13 0521  BP: 168/84  176/87 169/77 180/90  Pulse: 96 94 92 91  Temp:    98.4 F (36.9 C)  TempSrc:    Oral  Resp:    20  Height:    5\' 4"  (1.626 m)  Weight:    87.68 kg (193 lb 4.8 oz)  SpO2: 99% 99% 100% 99%   Blood pressure 180/90, pulse 91, temperature 98.4 F (36.9 C), temperature source Oral, resp. rate 20, height 5\' 4"  (1.626 m), weight 87.68 kg (193 lb 4.8 oz), SpO2 99.00%. PSYCH: SHe is alert and oriented x4; does not appear anxious does not appear depressed; affect is normal HEENT: Normocephalic and Atraumatic, Mucous membranes pink; PERRLA; EOM intact; Fundi:  Benign;  No scleral icterus, Nares: Patent, Oropharynx: Clear, Fair Dentition, Neck:  FROM, no cervical lymphadenopathy nor thyromegaly or carotid bruit; no JVD; Breasts:: Not examined CHEST WALL: No tenderness CHEST: Normal respiration, clear to auscultation bilaterally HEART: Regular rate and rhythm; no murmurs rubs or gallops BACK: No kyphosis or scoliosis; no CVA tenderness ABDOMEN: Positive Bowel Sounds,  Obese, soft non-tender; no masses, no organomegaly.   Rectal Exam: Not done EXTREMITIES: No cyanosis, clubbing or edema; no ulcerations. Genitalia: not examined PULSES: 2+ and symmetric SKIN: Normal hydration no rash or ulceration CNS:  Alert and Oriented X 4, No Focal Deficits.    Vascular: pulses palpable throughout    Labs on Admission:  Basic Metabolic Panel:  Recent Labs Lab 11/13/13 1617 11/15/13 0005  NA 138 142  K 3.7 3.3*  CL 99 98  CO2 29 29  GLUCOSE 259* 156*  BUN 17 22  CREATININE 1.3* 1.09  CALCIUM 9.3 9.8   Liver Function Tests:  Recent Labs Lab 11/13/13 1617 11/15/13 0005  AST 22 45*  ALT 20 23  ALKPHOS 88 96  BILITOT 0.5 0.2*  PROT 7.3 7.5  ALBUMIN 3.5 3.3*   No results found for this basename: LIPASE, AMYLASE,  in the last 168 hours No results found for this basename: AMMONIA,  in the last 168 hours  CBC:  Recent Labs Lab 11/15/13 0005  WBC 14.2*  NEUTROABS 11.8*  HGB 14.9  HCT 44.0   MCV 90.0  PLT 297   Cardiac Enzymes:  Recent Labs Lab 11/15/13 0005  CKTOTAL 1720*    BNP (last 3 results) No results found for this basename: PROBNP,  in the last 8760 hours CBG:  Recent Labs Lab 11/15/13 0429  GLUCAP 111*    Radiological Exams on Admission: Dg Chest 2 View  11/15/2013   CLINICAL DATA:  Hypotension, causing the patient to lose consciousness earlier tonight.  EXAM: CHEST  2 VIEW  COMPARISON:  DG CHEST 2 VIEW dated 09/23/2013; CT ANGIO CHEST W/CM &/OR WO/CM dated 06/12/2012; DG CHEST 2 VIEW dated 06/12/2012; DG CHEST 2 VIEW dated 12/18/2011  FINDINGS: Cardiac silhouette normal in size, unchanged. Thoracic aorta mildly atherosclerotic, unchanged. Hilar and mediastinal contours otherwise unremarkable. Lungs clear. Bronchovascular markings normal. Pulmonary vascularity normal. No visible pleural effusions. No pneumothorax. Degenerative changes involving the thoracic spine. No significant interval change.  IMPRESSION: No acute cardiopulmonary disease. Stable examination.   Electronically Signed   By: Evangeline Dakin M.D.   On: 11/15/2013 02:24      EKG: Independently reviewed.     Assessment/Plan:   63 y.o. female with  Principal Problem:   Rhabdomyolysis Active Problems:   Hypoglycemia   Elevated lactic acid level   Hypokalemia   DIABETES MELLITUS   HYPERTENSION, BENIGN ESSENTIAL   ASTHMA   Fibromyalgia   Other and unspecified hyperlipidemia    1.  Rhabdomyolysis-  Most likely due to hypoglycemic episode,   IVFs for gentle rehydration, monitor CPK levels.   2.  Hypoglycemic Episodes-  Occuring on New Insulin (the U-100),  Suggest using a less concentrated alternative, she had previously been on Novolog.     3.  Elevated Lactic Acid level-  IVFs to rehydrate.   Monitor levels.     4.  Hypokalemia-  replete K+ level.    5.  DM Type 1-  On Insulin pump, Check glucose q 4 hrs, and check HbA1c in AM.    6.  HTN- continue Amlodipine, Spironolactone,  and Hyzaar Rx, PRN IV Hydralazine for SBP  > 160.    7.   Asthma - stable , continue Rescue Inhaler Rx.    8.   Fibromyalgia-  Continue Cyclobenzaprine, and Pain control Rx PRN, NSAIDs on hold due to #3.      9.   Hyperlipidemia-   Continue Omega 3 Fatty Acids.    Hold Statins due to Elevated CPK level.    10.  DVT prophylaxis with Lovenox.         Code Status:   FULL CODE Family Communication:   No Family at Bedside  Disposition Plan:    Observation  Time spent: [place] [facility] Pager [phone]  If 7PM-7AM, please contact night-coverage [email or web address] Password [facility] 11/15/2013, 5:56 AM

## 2013-11-15 NOTE — Significant Event (Signed)
CBG recheck 137, vss . Patient instructed to not dose her insulin per her CBG on her machine and to use only our CBG readings,. Patient verbalized understanding and agreement.

## 2013-11-16 DIAGNOSIS — I10 Essential (primary) hypertension: Secondary | ICD-10-CM

## 2013-11-16 LAB — CK TOTAL AND CKMB (NOT AT ARMC)
CK TOTAL: 2537 U/L — AB (ref 7–177)
CK TOTAL: 2261 U/L — AB (ref 7–177)
CK TOTAL: 2195 U/L — AB (ref 7–177)
CK, MB: 12.7 ng/mL (ref 0.3–4.0)
CK, MB: 10.8 ng/mL (ref 0.3–4.0)
CK, MB: 10.5 ng/mL — AB (ref 0.3–4.0)
CK, MB: 10.4 ng/mL — AB (ref 0.3–4.0)
CK, MB: 11.3 ng/mL (ref 0.3–4.0)
CK, MB: 10.8 ng/mL (ref 0.3–4.0)
RELATIVE INDEX: 0.5 (ref 0.0–2.5)
RELATIVE INDEX: 0.5 (ref 0.0–2.5)
Relative Index: 0.5 (ref 0.0–2.5)
Relative Index: 0.5 (ref 0.0–2.5)
Relative Index: 0.5 (ref 0.0–2.5)
Relative Index: 0.5 (ref 0.0–2.5)
Total CK: 2180 U/L — ABNORMAL HIGH (ref 7–177)
Total CK: 2215 U/L — ABNORMAL HIGH (ref 7–177)
Total CK: 2361 U/L — ABNORMAL HIGH (ref 7–177)

## 2013-11-16 LAB — GLUCOSE, CAPILLARY
GLUCOSE-CAPILLARY: 205 mg/dL — AB (ref 70–99)
GLUCOSE-CAPILLARY: 123 mg/dL — AB (ref 70–99)
GLUCOSE-CAPILLARY: 235 mg/dL — AB (ref 70–99)
Glucose-Capillary: 164 mg/dL — ABNORMAL HIGH (ref 70–99)
Glucose-Capillary: 150 mg/dL — ABNORMAL HIGH (ref 70–99)

## 2013-11-16 MED ORDER — INSULIN GLARGINE 100 UNIT/ML ~~LOC~~ SOLN
15.0000 [IU] | Freq: Every day | SUBCUTANEOUS | Status: DC
  Filled 2013-11-16: qty 0.15

## 2013-11-16 MED ORDER — INSULIN DETEMIR 100 UNIT/ML ~~LOC~~ SOLN
10.0000 [IU] | Freq: Every day | SUBCUTANEOUS | Status: DC
  Administered 2013-11-16: 10 [IU] via SUBCUTANEOUS
  Filled 2013-11-16: qty 0.1

## 2013-11-16 MED ORDER — INSULIN ASPART 100 UNIT/ML ~~LOC~~ SOLN: 5.0000 [IU] | Freq: Three times a day (TID) | SUBCUTANEOUS | Status: DC

## 2013-11-16 MED ORDER — INSULIN DETEMIR 100 UNIT/ML FLEXPEN: 15.0000 [IU] | PEN_INJECTOR | Freq: Every day | SUBCUTANEOUS | Status: DC

## 2013-11-16 MED ORDER — INSULIN ASPART 100 UNIT/ML ~~LOC~~ SOLN
0.0000 [IU] | SUBCUTANEOUS | Status: DC
Start: 2013-11-16 — End: 2013-11-16
  Administered 2013-11-16: 5 [IU] via SUBCUTANEOUS
  Administered 2013-11-16: 3 [IU] via SUBCUTANEOUS
  Administered 2013-11-16: 2 [IU] via SUBCUTANEOUS

## 2013-11-16 MED ORDER — SODIUM CHLORIDE 0.9 % IV SOLN
INTRAVENOUS | Status: DC
  Administered 2013-11-16: 01:00:00 via INTRAVENOUS

## 2013-11-16 MED ORDER — LABETALOL HCL 100 MG PO TABS
100.0000 mg | ORAL_TABLET | Freq: Two times a day (BID) | ORAL | Status: DC
  Administered 2013-11-16 (×2): 100 mg via ORAL
  Filled 2013-11-16 (×3): qty 1

## 2013-11-16 NOTE — Progress Notes (Addendum)
Inpatient Diabetes Program Recommendations  AACE/ADA: New Consensus Statement on Inpatient Glycemic Control (2013)  Target Ranges:  Prepandial:   less than 140 mg/dL      Peak postprandial:   less than 180 mg/dL (1-2 hours)      Critically ill patients:  140 - 180 mg/dL   Results for [name], [name] (MRN [id number]) as of 11/16/2013 11:31  Ref. Range 11/15/2013 07:41 11/15/2013 11:43 11/15/2013 16:21 11/15/2013 16:40 11/15/2013 19:54 11/15/2013 20:22 11/15/2013 23:29 11/16/2013 01:46 11/16/2013 04:12 11/16/2013 07:50  Glucose-Capillary Latest Range: 70-99 mg/dL 118 (H) 83 24 (LL) 137 (H) 43 (LL) 118 (H) 133 (H) 205 (H) 164 (H) 123 (H)   Diabetes history: DM1 Outpatient Diabetes medications: Humulin R U500 via Medtronic insulin pump Current orders for Inpatient glycemic control: Levemir 10 units daily, insulin pump  Inpatient Diabetes Program Recommendations Insulin - Basal: Please consider changing Levemir to Lantus since patient has been on Lantus before and has some Lantus flexpens at home she can use. Also, may want to consider increasing basal insulin to Lantus 15 units daily.  Correction (SSI): Please order Novolog sensitive correction scale ACHS. Insulin - Meal Coverage: Please consider ordering Novolog 3 units TID with meals for meal coverage. IVF: Please consider removing dextrose from IV fluids if appropriate since patient is eating and drinking.  Note: Received consult for diabetes coordinator. Talked with the patient about diabetes and home regimen for diabetes control.  Patient reports that she is followed by Dr. [name] for diabetes management.  According to the patient she was taking Lantus 16 units QHS and Novolog sliding scale correction for diabetes control prior to going on the insulin pump.  She states that she went on the insulin pump in December 2014 utilizing U500 in the insulin pump.  Patient last saw Dr. [name] on 10/27/13 and has her next appointment scheduled with Dr. [name]  tomorrow on 11/17/13 at 9:15am.  Patient states that she started on the insulin pump with U500 she has experienced several low blood sugars.  Patient states that she remembers that Friday on 3/20 she checked her blood sugar around 10:30 pm and ate her bedtime snack.  She states that the next thing she remembers is waking up on the floor around 4:30 pm on Saturday 3/21. She states that she could tell her blood sugar was low so she ate peanut butter crackers and drank 1/2 glass of orange juice and her blood sugar was up over 200 mg/dl when the EMS arrived.  Patient continued to use her insulin pump with U500 from admission up until Sunday 3/22 around 4pm at which time she states that she removed the insulin pump.    Since patient removed the insulin pump she was started on Novolog 0-15 units Q4H on 3/22.  Currently patient only has Levemir 10 units daily ordered. Patient states that she still has Lantus flexpens at home in her refrigerator she could use. Please consider changing basal insulin from Levemir to Lantus. May want to consider increasing to Lantus 15 units daily.  Also, please order Novolog sensitive correction scale ACHS and Novolog 3 units TID with meals.  The insulin pump that patient has in her bag (not currently utilizing) is a Medtronic Mini-Med Serial Number: [id number].  Insulin pump settings reviewed and are the same as noted by Dr. [name] on 10/27/13 which are:   Basal rates  Midnight = 0.1 3 AM = 0.05  7: 30 AM = 0.4 12 noon = 0.7 6 PM =  0.6 9 PM = 0.1   Carbohydrate coverage 1:15 from 12am to 1pm (1 unit of U500 covers 15 grams of carbs) Carbohydrate coverage 1:20 from 1pm to 12am (1 unit of U500 covers 20 grams of carbs) Correction factor 1: 40 (1 unit of U500 drops blood glucose 40 mg/dl)  Target  glucose110-120 mg/dl  Patient states that Dr. [name] had told her that he was planning to change her insulin from U500 to Novolog in the insulin pump.  Asked if she knew when and she  states that she is not sure.  Patient does not want to use her insulin pump until she returns to Dr. [name] and wants to change insulins from U500 to Novolog. In talking with the patient she has been having several lows over the past few months. She reports that in January she was driving her car and the next thing she remembers was coming to in a [employer] parking lot with her car up on the landscaping. Will discuss plan for patient with Dr. Aileen Fass to see if patient's follow up appointment with Dr. [name] needs to be changed. Talked with Dr. Aileen Fass over the phone and the plan if for the patient to discharge today. Talked with patient to reminder her to be sure to keep her appointment with Dr. [name] in the morning at 9:15.   Thanks, Barnie Alderman, RN, MSN, CCRN Diabetes Coordinator Inpatient Diabetes Program [phone] (Team Pager) [phone] ([facility] office) [phone] [facility] office)

## 2013-11-16 NOTE — Progress Notes (Signed)
Patient evaluated for community based chronic disease management services with [facility] as a benefit of patient's [employer]. Spoke with patient at bedside to explain [facility] services.  Services have been accepted.  Written consents received with authorized contacts noted.  Patient will receive a post discharge transition of care call and will be evaluated for monthly home visits for assessments and diabetes disease process education.  Left contact information and [facility] literature at bedside. Made Inpatient Case Manager aware that [place] [facility] following. Of note, [facility] Care Management services does not replace or interfere with any services that are arranged by inpatient case management or social work.  For additional questions or referrals please contact Corliss Blacker BSN RN [facility] Liaison at [phone].

## 2013-11-16 NOTE — Discharge Summary (Signed)
Physician Discharge Summary  [name] UUV:[id number] DOB: 1951-04-06 DOA: 11/14/2013  PCP: Gwendolyn Fendt, MD  Admit date: 11/14/2013 Discharge date: 11/16/2013  Time spent: 35 minutes  Recommendations for Outpatient Follow-up:  1. Follow up Dr. [name] to titrate insulin. 2. Re-evaluate the use of her insulin with insulin pump  Discharge Diagnoses:  Principal Problem:   Rhabdomyolysis Active Problems:   DIABETES MELLITUS   HYPERTENSION, BENIGN ESSENTIAL   ASTHMA   Fibromyalgia   Other and unspecified hyperlipidemia   Elevated lactic acid level   Hypoglycemia   Hypokalemia   Discharge Condition: stable  Diet recommendation: carb modified  Filed Weights   11/14/13 2304 11/15/13 0521 11/16/13 0420  Weight: 82.555 kg (182 lb) 87.68 kg (193 lb 4.8 oz) 86.773 kg (191 lb 4.8 oz)    History of present illness:  63 y.o. female with Type I DM since age 2, who has had placement of an Insulin pump 3 months ago who presents to the ED with complaints of waking up on the floor in the AM today. She reports that she did not know how she ended up on the floor , but she got into bed at 9:30pm and awakened at 4:30 AM on the Floor. She also did not know how long she had been on the floor and she denies feeling any pain or discomfort. She reports that when she awakened she did feel "funny " Shaky as if her blood sugar was running low, and she got up and checked her blood sugar and found it to be 139, and she ate something and then injected her U-100 regular insulin after she had calculated her carbohydrates, and she reports feeling shaky and as if she would faint. She see Dr. [name] Endocrinologist and had recently been placed on the U-100 Insulin because she had glucose levels in the 300's. She reports since she has been taking the U-100 she has had more episodes of her sugars bottoming out. She reports that she eats on a regulare schedule and has snacks in between as well. She denies having  any fevers or chills. In the ED, she was found to have a CPK level of 1730, and a lactic Acid level of 3.68. She was referred for medical admission.    Hospital Course:  Rhabdomyolysis:  - most likely due to hypoglycemia.   - CK  < 5k. Started on IV fluids with improvement  CK. - Cr improved.   AKI: - Due to elevated CK. - resolved with IV fluids.  Recurrent Hypoglycemia/ DIABETES MELLITUS:  - 2 episode in house have stopped pump. Her meter has been giving wrong readings, there is 50 point difference between her meter and the hospital meter.  - Start levemir and SSI.   Hypokalemia  - resolved.   HYPERTENSION, BENIGN ESSENTIAL  - cont home medications.   Procedures:  CXR  Consultations:  none  Discharge Exam: Filed Vitals:   11/16/13 1446  BP: 135/68  Pulse: 81  Temp: 98.6 F (37 C)  Resp: 18    General: A&O x3 Cardiovascular: RRR Respiratory: good air movement CTA B/L  Discharge Instructions      Discharge Orders   Future Appointments Provider Department Dept Phone   11/17/2013 10:30 AM Elayne Snare, MD [facility] End[facility]ology [phone]   12/04/2013 1:00 PM [place], [place] [phone]   Future Orders Complete By Expires   Diet - low sodium heart healthy  As directed    Increase  activity slowly  As directed        Medication List    STOP taking these medications       insulin pump Soln     meloxicam 15 MG tablet  Commonly known as:  MOBIC     PRESCRIPTION MEDICATION     PRESCRIPTION MEDICATION      TAKE these medications       ACCU-CHEK FASTCLIX LANCETS Misc     albuterol 108 (90 BASE) MCG/ACT inhaler  Commonly known as:  PROVENTIL HFA;VENTOLIN HFA  Inhale 2 puffs into the lungs every 6 (six) hours as needed for wheezing or shortness of breath. For shortness of breath     amLODipine 10 MG tablet  Commonly known as:  NORVASC  Take 10 mg by mouth daily.     aspirin 81 MG chewable tablet  Chew 81  mg by mouth daily.     azithromycin 250 MG tablet  Commonly known as:  ZITHROMAX  Take 250-500 mg by mouth See admin instructions. 2 tablets by mouth once and then 1 tablet daily x 4 days     cetirizine 10 MG tablet  Commonly known as:  ZYRTEC  Take 10 mg by mouth daily as needed for allergies.     colchicine 0.6 MG tablet  Take 0.6 mg by mouth 2 (two) times daily.     cyclobenzaprine 10 MG tablet  Commonly known as:  FLEXERIL  Take 10 mg by mouth 3 (three) times daily as needed for muscle spasms.     DEXILANT 60 MG capsule  Generic drug:  dexlansoprazole  Take 60 mg by mouth at bedtime. For acid reflex     docusate sodium 100 MG capsule  Commonly known as:  COLACE  Take 100 mg by mouth 2 (two) times daily as needed for mild constipation.     EPIPEN 2-PAK 0.3 mg/0.3 mL Soaj injection  Generic drug:  EPINEPHrine  Inject 0.3 mg into the muscle once.     gabapentin 300 MG capsule  Commonly known as:  NEURONTIN  Take 300 mg by mouth 2 (two) times daily as needed (for pain). If needed can use up to tid     glucagon 1 MG injection  Commonly known as:  GLUCAGON EMERGENCY  Inject 1 mg into the vein once as needed.     glucose blood test strip  Commonly known as:  ACCU-CHEK SMARTVIEW  Use as instructed to check blood sugars 3 times per day dx code 250.00     hydrOXYzine 25 MG tablet  Commonly known as:  ATARAX/VISTARIL  Take 50 mg by mouth 2 (two) times daily as needed for itching. For itching     insulin aspart 100 UNIT/ML injection  Commonly known as:  NOVOLOG  Inject 5 Units into the skin 3 (three) times daily before meals.     Insulin Detemir 100 UNIT/ML Pen  Commonly known as:  LEVEMIR  Inject 15 Units into the skin daily at 10 pm.     losartan-hydrochlorothiazide 100-25 MG per tablet  Commonly known as:  HYZAAR  Take 1 tablet by mouth daily.     methylPREDNISolone 4 MG tablet  Commonly known as:  MEDROL DOSEPAK  Take 4-32 mg by mouth See admin instructions.  Taper dose.  follow package directions     mirabegron ER 50 MG Tb24 tablet  Commonly known as:  MYRBETRIQ  Take 50 mg by mouth daily as needed (for bladder).     multivitamin with minerals  Tabs tablet  Take 1 tablet by mouth daily.     nitrofurantoin 100 MG capsule  Commonly known as:  MACRODANTIN  Take 100 mg by mouth 2 (two) times daily.     oxyCODONE-acetaminophen 5-325 MG per tablet  Commonly known as:  PERCOCET/ROXICET  1-2 tablets every 6 (six) hours as needed for moderate pain. 1 to 2 tablets every 6 hours as needed for pain.     potassium chloride SA 20 MEQ tablet  Commonly known as:  K-DUR,KLOR-CON  Take 1 tablet (20 mEq total) by mouth daily.     pravastatin 40 MG tablet  Commonly known as:  PRAVACHOL  Take 40 mg by mouth daily.     salsalate 750 MG tablet  Commonly known as:  DISALCID  Take 1,500 mg by mouth 2 (two) times daily. 2 tabs twice daily with food for arthritis     spironolactone 25 MG tablet  Commonly known as:  ALDACTONE  Take 25 mg by mouth daily.     trimethoprim 100 MG tablet  Commonly known as:  TRIMPEX  Take 100 mg by mouth daily.     VICTOZA 18 MG/3ML Soln injection  Generic drug:  Liraglutide  Inject 1.2 mg into the skin every morning.       Allergies  Allergen Reactions  . Codeine Itching and Nausea And Vomiting   Follow-up Information   Follow up with [facility], MD In 1 week. (hospital follow up)    Specialty:  Endocrinology   Contact information:   [place]  [phone]        The results of significant diagnostics from this hospitalization (including imaging, microbiology, ancillary and laboratory) are listed below for reference.    Significant Diagnostic Studies: Dg Chest 2 View  11/15/2013   CLINICAL DATA:  Hypotension, causing the patient to lose consciousness earlier tonight.  EXAM: CHEST  2 VIEW  COMPARISON:  DG CHEST 2 VIEW dated 09/23/2013; CT ANGIO CHEST W/CM &/OR WO/CM  dated 06/12/2012; DG CHEST 2 VIEW dated 06/12/2012; DG CHEST 2 VIEW dated 12/18/2011  FINDINGS: Cardiac silhouette normal in size, unchanged. Thoracic aorta mildly atherosclerotic, unchanged. Hilar and mediastinal contours otherwise unremarkable. Lungs clear. Bronchovascular markings normal. Pulmonary vascularity normal. No visible pleural effusions. No pneumothorax. Degenerative changes involving the thoracic spine. No significant interval change.  IMPRESSION: No acute cardiopulmonary disease. Stable examination.   Electronically Signed   By: Evangeline Dakin M.D.   On: 11/15/2013 02:24   Dg Knee Complete 4 Views Right  10/26/2013   CLINICAL DATA:  Right knee pain.  No known injury.  EXAM: RIGHT KNEE - COMPLETE 4+ VIEW  COMPARISON:  MR EXTREM LOW JT*R* W/O CM dated 02/07/2009; KNEE dated 02/02/2009  FINDINGS: Mild degenerative spurring in the patellofemoral compartment. Slight joint space narrowing diffusely. No fracture, subluxation or dislocation. Probable small joint effusion.  IMPRESSION: Mild degenerative changes. Small joint effusion. No acute bony abnormality.   Electronically Signed   By: Rolm Baptise M.D.   On: 10/26/2013 12:00    Microbiology: No results found for this or any previous visit (from the past 240 hour(s)).   Labs: Basic Metabolic Panel:  Recent Labs Lab 11/13/13 1617 11/15/13 0005 11/15/13 0803  NA 138 142 142  K 3.7 3.3* 4.2  CL 99 98 104  CO2 29 29 25   GLUCOSE 259* 156* 110*  BUN 17 22 16   CREATININE 1.3* 1.09 0.99  CALCIUM 9.3 9.8 8.9   Liver Function  Tests:  Recent Labs Lab 11/13/13 1617 11/15/13 0005  AST 22 45*  ALT 20 23  ALKPHOS 88 96  BILITOT 0.5 0.2*  PROT 7.3 7.5  ALBUMIN 3.5 3.3*   No results found for this basename: LIPASE, AMYLASE,  in the last 168 hours No results found for this basename: AMMONIA,  in the last 168 hours CBC:  Recent Labs Lab 11/15/13 0005 11/15/13 0803  WBC 14.2* 12.7*  NEUTROABS 11.8*  --   HGB 14.9 14.0  HCT 44.0  41.5  MCV 90.0 90.0  PLT 297 274   Cardiac Enzymes:  Recent Labs Lab 11/16/13 0425 11/16/13 0732 11/16/13 0941 11/16/13 1120 11/16/13 1451  CKTOTAL 2261* 2180* 2215* 2361* 2195*  CKMB 10.8* 10.5* 10.4* 11.3* 10.8*   BNP: BNP (last 3 results) No results found for this basename: PROBNP,  in the last 8760 hours CBG:  Recent Labs Lab 11/16/13 0146 11/16/13 0412 11/16/13 0750 11/16/13 1142 11/16/13 1630  GLUCAP 205* 164* 123* 150* 235*       Signed:  Charlynne Cousins  [facility] 11/16/2013, 4:49 PM

## 2013-11-16 NOTE — Progress Notes (Signed)
Paged secondary to patient having frequent hypoglycemic episodes; patient states negative history of heart disease   General; A./O. x4, NAD Cardiac; regular rhythm and rate, negative murmurs rubs gallops Pulmonary; clear to auscultation bilateral    A/P  Rhabdomyolysis  -Start D5 normal saline 159ml/hr  -Start normal saline 169ml/hr -Obtain CK q 4hr , last CK 3139 at 1145 on 3/22   Hypoglycemia frequent episode  -Patient had CBG of 24, and another episode of 43. Instructed RN Ronalee Belts to DC normal saline and start D5 normal saline 155ml/hr.  -Obtain CBGq4hr  -Obtain CK q 4hr   HTN uncontrolled -Studies show that labetalol is the best beta blocker to use in an uncontrolled diabetic we'll start labetalol 100mg  BID   Time spent; 35 minutes

## 2013-11-16 NOTE — Progress Notes (Signed)
[facility] PROGRESS NOTE Interim History: 63 y.o. female with Type I DM since age 89, who has had placement of an Insulin pump 3 months ago who presents to the ED with complaints of waking up on the floor in the AM today. She reports that she did not know how she ended up on the floor , but she got into bed at 9:30pm and awakened at 4:30 AM on the Floor    Assessment/Plan: Rhabdomyolysis: - most likely due to hypoglycemia - CK pending previous < 5k. - Cr improved.   Elevated lactic acid level  Recurrent Hypoglycemia/ DIABETES MELLITUS: - 2 episode in house have stopped pump. Her meter has been giving wrong readings, there is 100 point difference between her meter and the hospital meter. - start levemir and SSI.   Hypokalemia - resolved.  HYPERTENSION, BENIGN ESSENTIAL - cont home medications.    Code Status: full Family Communication: noen  Disposition Plan:inpatient   Consultants:  none  Procedures:  none  Antibiotics:  none  HPI/Subjective: Hypoglycemia overnight.  Objective: Filed Vitals:   11/15/13 2104 11/16/13 0049 11/16/13 0420 11/16/13 0646  BP:  158/81  128/62  Pulse: 93 91  78  Temp: 98.3 F (36.8 C) 97.8 F (36.6 C)  97.2 F (36.2 C)  TempSrc: Oral Oral  Oral  Resp:    18  Height:      Weight:   86.773 kg (191 lb 4.8 oz)   SpO2: 100% 99%  100%    Intake/Output Summary (Last 24 hours) at 11/16/13 1000 Last data filed at 11/16/13 0051  Gross per 24 hour  Intake    720 ml  Output   1201 ml  Net   -481 ml   Filed Weights   11/14/13 2304 11/15/13 0521 11/16/13 0420  Weight: 82.555 kg (182 lb) 87.68 kg (193 lb 4.8 oz) 86.773 kg (191 lb 4.8 oz)    Exam:  General: Alert, awake, oriented x3, in no acute distress.  HEENT: No bruits, no goiter.  Heart: Regular rate and rhythm, without murmurs, rubs, gallops.  Lungs: Good air movement, clear   Data Reviewed: Basic Metabolic Panel:  Recent Labs Lab 11/13/13 1617  11/15/13 0005 11/15/13 0803  NA 138 142 142  K 3.7 3.3* 4.2  CL 99 98 104  CO2 29 29 25   GLUCOSE 259* 156* 110*  BUN 17 22 16   CREATININE 1.3* 1.09 0.99  CALCIUM 9.3 9.8 8.9   Liver Function Tests:  Recent Labs Lab 11/13/13 1617 11/15/13 0005  AST 22 45*  ALT 20 23  ALKPHOS 88 96  BILITOT 0.5 0.2*  PROT 7.3 7.5  ALBUMIN 3.5 3.3*   No results found for this basename: LIPASE, AMYLASE,  in the last 168 hours No results found for this basename: AMMONIA,  in the last 168 hours CBC:  Recent Labs Lab 11/15/13 0005 11/15/13 0803  WBC 14.2* 12.7*  NEUTROABS 11.8*  --   HGB 14.9 14.0  HCT 44.0 41.5  MCV 90.0 90.0  PLT 297 274   Cardiac Enzymes:  Recent Labs Lab 11/15/13 1145 11/15/13 2135 11/16/13 0110 11/16/13 0425 11/16/13 0732  CKTOTAL 3139* 2850* 2537* 2261* 2180*  CKMB  --  14.5* 12.7* 10.8* 10.5*   BNP (last 3 results) No results found for this basename: PROBNP,  in the last 8760 hours CBG:  Recent Labs Lab 11/15/13 2022 11/15/13 2329 11/16/13 0146 11/16/13 0412 11/16/13 0750  GLUCAP 118* 133* 205* 164* 123*  No results found for this or any previous visit (from the past 240 hour(s)).   Studies: Dg Chest 2 View  11/15/2013   CLINICAL DATA:  Hypotension, causing the patient to lose consciousness earlier tonight.  EXAM: CHEST  2 VIEW  COMPARISON:  DG CHEST 2 VIEW dated 09/23/2013; CT ANGIO CHEST W/CM &/OR WO/CM dated 06/12/2012; DG CHEST 2 VIEW dated 06/12/2012; DG CHEST 2 VIEW dated 12/18/2011  FINDINGS: Cardiac silhouette normal in size, unchanged. Thoracic aorta mildly atherosclerotic, unchanged. Hilar and mediastinal contours otherwise unremarkable. Lungs clear. Bronchovascular markings normal. Pulmonary vascularity normal. No visible pleural effusions. No pneumothorax. Degenerative changes involving the thoracic spine. No significant interval change.  IMPRESSION: No acute cardiopulmonary disease. Stable examination.   Electronically Signed   By:  Evangeline Dakin M.D.   On: 11/15/2013 02:24    Scheduled Meds: . amLODipine  10 mg Oral Daily  . aspirin  81 mg Oral Daily  . azithromycin  250 mg Oral Daily  . colchicine  0.6 mg Oral BID  . hydrochlorothiazide  12.5 mg Oral Daily  . insulin detemir  10 Units Subcutaneous Daily  . insulin pump   Subcutaneous 6 times per day  . labetalol  100 mg Oral BID  . Liraglutide  1.2 mg Subcutaneous Q breakfast  . loratadine  10 mg Oral Daily  . multivitamin with minerals  1 tablet Oral Daily  . pantoprazole  40 mg Oral Daily  . potassium chloride SA  20 mEq Oral Daily  . pravastatin  40 mg Oral q1800  . sodium chloride  3 mL Intravenous Q12H  . spironolactone  25 mg Oral Daily   Continuous Infusions: . dextrose 5 % and 0.9% NaCl 100 mL/hr at 11/16/13 0654     Charlynne Cousins  [facility] Pager [phone]. If 8PM-8AM, please contact night-coverage at [email or web address], password [facility] 11/16/2013, 10:00 AM  LOS: 2 days

## 2013-11-16 NOTE — Progress Notes (Signed)
DC IV, DC Tele, DC Home. Discharge instructions and home medications discussed with patient. Patient denied any questions or concerns at this time. Patient leaving unit via wheelchair and appears in no acute distress.  

## 2013-11-17 ENCOUNTER — Telehealth: Payer: Self-pay | Admitting: *Deleted

## 2013-11-17 ENCOUNTER — Encounter: Payer: PRIVATE HEALTH INSURANCE | Attending: Endocrinology | Admitting: Nutrition

## 2013-11-17 ENCOUNTER — Encounter: Payer: Self-pay | Admitting: Endocrinology

## 2013-11-17 ENCOUNTER — Ambulatory Visit (INDEPENDENT_AMBULATORY_CARE_PROVIDER_SITE_OTHER): Payer: PRIVATE HEALTH INSURANCE | Admitting: Endocrinology

## 2013-11-17 VITALS — BP 138/84 | HR 98 | Wt 190.5 lb

## 2013-11-17 DIAGNOSIS — IMO0001 Reserved for inherently not codable concepts without codable children: Secondary | ICD-10-CM

## 2013-11-17 DIAGNOSIS — E119 Type 2 diabetes mellitus without complications: Secondary | ICD-10-CM | POA: Insufficient documentation

## 2013-11-17 DIAGNOSIS — Z713 Dietary counseling and surveillance: Secondary | ICD-10-CM | POA: Insufficient documentation

## 2013-11-17 DIAGNOSIS — E1165 Type 2 diabetes mellitus with hyperglycemia: Secondary | ICD-10-CM

## 2013-11-17 NOTE — Telephone Encounter (Signed)
Lelan Pons with [facility] called about patients hospital stay, apparently she went to bed Friday night with a blood sugar of 130, then she doesn't remember anything until Saturday afternoon when she woke up on the floor, she called EMS who took her to the hospital, patient was still using the U-500 in her pump.   The hospitalist d/c her pump and discharged her with 15 units of lantus at night and 5 units of Novolog 3 times a day with meals.

## 2013-11-17 NOTE — Patient Instructions (Signed)
Test blood sugars before meals, 2hr. After meals and at 3AM. Call blood sugars to me tomorrow afternoon.

## 2013-11-17 NOTE — Progress Notes (Signed)
Patient ID: [name], female   DOB: 01-09-1951, 63 y.o.   MRN: [id number]   Reason for Appointment: Diabetes follow-up   History of Present Illness   Diagnosis: Type 2, date of diagnosis:1985.    PAST history: She has had difficult to control diabetes requiring large doses of insulin especially at meal times. Because of poor control she was switched from NovoLog to U-500 insulin in 11/2012. At that time her A1c was 9%  Initially seemed to have better blood sugars. With no clear response with Invokana this was stopped and she was started on Actos. Her insulin was adjusted with higher mealtime coverage at breakfast and lunch and lower amounts at supper. Lantus requirement appeared to be more in the morning and evening dose was eventually stopped. She  has been given Victoza and Invokana later in an attempt to improve her sugars but with only limited success. Because of continued poor control with subcutaneous insulin and large insulin requirement she was switched to a Medtronic insulin pump which she started on 07/29/13 Prior to starting insulin pump her blood sugar at home was averaging 273 With the U 500 insulin in the pump her blood sugars were dramatically better with rare hypoglycemia   RECENT history:  She had another sort of severe hypoglycemia when apparently she didn't wake up on Saturday morning, 3 days ago The night before her glucose was 139 and she had a bedtime snack without any bolus She had been continuing on her insulin pump with the U-500 insulin and had not switched to NovoLog as planned on the last visit Also on her last visit her evening basal rate was reduced starting at 8 PM as a precaution Her basal rates are minimal overnight usually  Since her admission she has not been on the pump and has been taking Levemir and NovoLog; however since she came home last night she has only taken Lantus 30 units this morning as she did not have NovoLog at home With the U-500  insulin she was requiring most of her basal insulin between 11 AM-9 PM  Pump settings previously with U-500 insulin: Basal rate midnight = 0.1, 3 AM = 0.05. 7: 30 AM = 0.4. 11 am = 0.8, 5 PM = 0.55 and 9 PM = 0.1 Carbohydrate coverage 1:15 until 1 PM, after 1 PM 1:20 and correction factor 1:40 with target 110-120 BASAL total amount = 9.3 units, bolus percent use 36 %  Meals: 3 meals per day, at 11 am, 3 pm and 7 pm Carbohydrate intake: 75 g daily on average.  Food preferences: She has oatmeal or cereal and juice,  toast  occasionally with egg, usually has a sandwich and salad for lunch and vegetables for dinner with fish or lean meat for protein. Will snack on apples, raisons or wheat thins.   Wt Readings from Last 3 Encounters:  11/17/13 190 lb 8 oz (86.41 kg)  11/16/13 191 lb 4.8 oz (86.773 kg)  10/27/13 184 lb 12.8 oz (83.825 kg)   Physical activity: exercise: Irregular now with taking care of family members; otherwise at the gym for  60 min, 5/7 days a week.  Dietician visit: Most recent: 2012  Retinal exam: Most recent: 2010.   Complications: No retinopathy or known nephropathy.    Lab Results  Component Value Date   HGBA1C 7.2* 10/09/2013   HGBA1C 10.2* 08/03/2013   HGBA1C 10.3* 03/16/2013   Lab Results  Component Value Date   MICROALBUR 3.0* 03/16/2013  [place] 99 11/13/2013   CREATININE 0.99 11/15/2013       Medication List       This list is accurate as of: 11/17/13 10:56 AM.  Always use your most recent med list.               ACCU-CHEK FASTCLIX LANCETS Misc     albuterol 108 (90 BASE) MCG/ACT inhaler  Commonly known as:  PROVENTIL HFA;VENTOLIN HFA  Inhale 2 puffs into the lungs every 6 (six) hours as needed for wheezing or shortness of breath. For shortness of breath     amLODipine 10 MG tablet  Commonly known as:  NORVASC  Take 10 mg by mouth daily.     aspirin 81 MG chewable tablet  Chew 81 mg by mouth daily.     azithromycin 250 MG tablet  Commonly  known as:  ZITHROMAX  Take 250-500 mg by mouth See admin instructions. 2 tablets by mouth once and then 1 tablet daily x 4 days     cetirizine 10 MG tablet  Commonly known as:  ZYRTEC  Take 10 mg by mouth daily as needed for allergies.     colchicine 0.6 MG tablet  Take 0.6 mg by mouth 2 (two) times daily.     cyclobenzaprine 10 MG tablet  Commonly known as:  FLEXERIL  Take 10 mg by mouth 3 (three) times daily as needed for muscle spasms.     DEXILANT 60 MG capsule  Generic drug:  dexlansoprazole  Take 60 mg by mouth at bedtime. For acid reflex     docusate sodium 100 MG capsule  Commonly known as:  COLACE  Take 100 mg by mouth 2 (two) times daily as needed for mild constipation.     EPIPEN 2-PAK 0.3 mg/0.3 mL Soaj injection  Generic drug:  EPINEPHrine  Inject 0.3 mg into the muscle once.     gabapentin 300 MG capsule  Commonly known as:  NEURONTIN  Take 300 mg by mouth 2 (two) times daily as needed (for pain). If needed can use up to tid     glucagon 1 MG injection  Commonly known as:  GLUCAGON EMERGENCY  Inject 1 mg into the vein once as needed.     glucose blood test strip  Commonly known as:  ACCU-CHEK SMARTVIEW  Use as instructed to check blood sugars 3 times per day dx code 250.00     hydrOXYzine 25 MG tablet  Commonly known as:  ATARAX/VISTARIL  Take 50 mg by mouth 2 (two) times daily as needed for itching. For itching     insulin aspart 100 UNIT/ML injection  Commonly known as:  NOVOLOG  Inject 5 Units into the skin 3 (three) times daily before meals.     losartan-hydrochlorothiazide 100-25 MG per tablet  Commonly known as:  HYZAAR  Take 1 tablet by mouth daily.     meloxicam 15 MG tablet  Commonly known as:  MOBIC     methylPREDNISolone 4 MG tablet  Commonly known as:  MEDROL DOSEPAK  Take 4-32 mg by mouth See admin instructions. Taper dose.  follow package directions     mirabegron ER 50 MG Tb24 tablet  Commonly known as:  MYRBETRIQ  Take 50 mg by  mouth daily as needed (for bladder).     multivitamin with minerals Tabs tablet  Take 1 tablet by mouth daily.     nitrofurantoin 100 MG capsule  Commonly known as:  MACRODANTIN  Take 100 mg by mouth  2 (two) times daily.     oxyCODONE-acetaminophen 5-325 MG per tablet  Commonly known as:  PERCOCET/ROXICET  1-2 tablets every 6 (six) hours as needed for moderate pain. 1 to 2 tablets every 6 hours as needed for pain.     potassium chloride SA 20 MEQ tablet  Commonly known as:  K-DUR,KLOR-CON  Take 1 tablet (20 mEq total) by mouth daily.     pravastatin 40 MG tablet  Commonly known as:  PRAVACHOL  Take 40 mg by mouth daily.     salsalate 750 MG tablet  Commonly known as:  DISALCID  Take 1,500 mg by mouth 2 (two) times daily. 2 tabs twice daily with food for arthritis     spironolactone 25 MG tablet  Commonly known as:  ALDACTONE  Take 25 mg by mouth daily.     trimethoprim 100 MG tablet  Commonly known as:  TRIMPEX  Take 100 mg by mouth daily.     VICTOZA 18 MG/3ML Soln injection  Generic drug:  Liraglutide  Inject 1.2 mg into the skin every morning.        Allergies:  Allergies  Allergen Reactions  . Codeine Itching and Nausea And Vomiting    Past Medical History  Diagnosis Date  . Diabetes mellitus   . Hypertension   . Arthritis   . Fibromyalgia     Past Surgical History  Procedure Laterality Date  . Cholecystectomy    . Colon surgery    . Esophagus dilated    . Arthroscopic knee surgery    . Carpal tunnel release Bilateral     Family History  Problem Relation Age of Onset  . Diabetes Mother   . Hypertension Mother   . Cancer Mother   . Diabetes Father   . Hypertension Father   . Diabetes Brother     Social History:  reports that she has never smoked. She has never used smokeless tobacco. She reports that she does not drink alcohol or use illicit drugs.  Review of Systems   She sees Eye Dr in 5/15 for followup  HYPERTENSION:  blood pressure  is  only fairly well  controlled recently;  home readings 140-150/ 85-90. To followup with PCP this month  No history of recent pedal edema No history of burning or tingling in her feet HYPERLIPIDEMIA: This is not at goal with using pravastatin prescribed by PCP  Lab Results  Component Value Date   CHOL 178 11/13/2013   HDL 58.30 11/13/2013   LDLCALC 99 11/13/2013   LDLDIRECT 152.8 10/09/2013   TRIG 105.0 11/13/2013   CHOLHDL 3 11/13/2013     Examination:   BP 138/84  Pulse 98  Wt 190 lb 8 oz (86.41 kg)  SpO2 98%  Body mass index is 32.68 kg/(m^2).    no ankle edema  Assesment/PLAN:   Diabetes type 2, uncontrolled  Because of her episode of severe hypoglycemia she will switch her insulin pump to NovoLog today Her settings were reviewed today and she will work with the nurse educator today to program her pump and get her cartridge filled with NovoLog She will call blood sugar readings daily basis for further adjustment  Insulin pump settings for switching to NovoLog will be as follows: Basal rate midnight = 0.6, 3 AM = 0.7, 8 AM = 1.5, 11 AM = 3.5, 5 PM = 2.5 and 9 PM = 1.5. Carbohydrate coverage 1:5, correction 1: 25 and target 110-120, insulin duration 4 hours  Continue Invokana  And metformin  Domini Vandehei 11/17/2013, 10:56 AM   Addendum:

## 2013-11-17 NOTE — Progress Notes (Signed)
Patient was put back on her pump.  Her old cartridge of U 500 insulin was thrown away and a new cartridge of Novolog was installed into the pump. Her basal rates were changed to: MN: 0.6,  3AM: 0.7,  8AM: 1.5, 11AM: 3.5,  5PM: 2.5, 9PM: 1.5.   Her I/C ratio: was changed to 5, her sensitivity: 25, target 110-120, timing: 4 hours.  She took her Lantus this morning at 7AM.  Her pump was put in a basal reduction mode of 25% per Dr. Ronnie Derby orders until [facility] tomorrow morning.    She was told to test her blood sugars before meals, 2hr. pc meals and HS.  She agreed to do this and to call me tomorrow with blood sugar readings.

## 2013-11-17 NOTE — Patient Instructions (Signed)
With Novolog:  Basal rate midnight = 0.6, 3 AM = 0.7, 8 AM = 1.5, 11 AM = 3.5, 5 PM = 2.5 and 9 PM = 1.5. Carbohydrate coverage 1:5, correction 1:40 and target 110-120

## 2013-11-23 ENCOUNTER — Other Ambulatory Visit: Payer: Self-pay | Admitting: *Deleted

## 2013-11-23 ENCOUNTER — Telehealth: Payer: Self-pay | Admitting: *Deleted

## 2013-11-23 MED ORDER — INSULIN ASPART 100 UNIT/ML ~~LOC~~ SOLN: SUBCUTANEOUS | Status: DC

## 2013-11-23 NOTE — Telephone Encounter (Signed)
Is she getting a protein at breakfast?  If the blood sugar is higher after breakfast consistently change her carbohydrate ratio to 1:4 until 12 noon and subsequently stay on 1:5. Otherwise blood sugars are looking good overall

## 2013-11-23 NOTE — Telephone Encounter (Signed)
Instructions given to patient

## 2013-11-23 NOTE — Telephone Encounter (Signed)
Patient was given her lab results and she gave me her glucose readings for the last 3 days:  26th, 1:50 am-132    27th 8:30 am-80 3:03 am- 183            10 am- 207  6:56 am-143     12:15 pm-145  8:23 am- 103     2:17 pm- 161 10 am- 150     4 pm- 87 1:06 pm-93     6 pm- 69 3;20 pm- 138     8 pm-137 5:34 pm-242     10 pm- 186  7:39 pm-205       10 pm- 178  28th 8:25 am-119          10 am-204  2 pm- 197  4 pm 166  6 pm- 213  8 pm-161  10:15 pm- 144

## 2013-11-25 ENCOUNTER — Other Ambulatory Visit: Payer: Self-pay | Admitting: *Deleted

## 2013-12-01 ENCOUNTER — Ambulatory Visit (INDEPENDENT_AMBULATORY_CARE_PROVIDER_SITE_OTHER): Payer: PRIVATE HEALTH INSURANCE | Admitting: Endocrinology

## 2013-12-01 ENCOUNTER — Encounter: Payer: Self-pay | Admitting: Endocrinology

## 2013-12-01 VITALS — BP 162/86 | HR 88 | Temp 97.9°F | Resp 14 | Ht 64.0 in | Wt 189.4 lb

## 2013-12-01 DIAGNOSIS — IMO0001 Reserved for inherently not codable concepts without codable children: Secondary | ICD-10-CM

## 2013-12-01 DIAGNOSIS — E785 Hyperlipidemia, unspecified: Secondary | ICD-10-CM

## 2013-12-01 DIAGNOSIS — E1165 Type 2 diabetes mellitus with hyperglycemia: Principal | ICD-10-CM

## 2013-12-01 DIAGNOSIS — I10 Essential (primary) hypertension: Secondary | ICD-10-CM

## 2013-12-01 NOTE — Progress Notes (Signed)
Patient ID: [name], female   DOB: October 24, 1950, 63 y.o.   MRN: [id number]   Reason for Appointment: Diabetes follow-up   History of Present Illness   Diagnosis: Type 2, date of diagnosis:1985.    PAST history: She has had difficult to control diabetes requiring large doses of insulin especially at meal times. Because of poor control she was switched from NovoLog to U-500 insulin in 11/2012. At that time her A1c was 9%  Initially seemed to have better blood sugars. With no clear response with Invokana this was stopped and she was started on Actos. Her insulin was adjusted with higher mealtime coverage at breakfast and lunch and lower amounts at supper. Lantus requirement appeared to be more in the morning and evening dose was eventually stopped. She  has been given Victoza and Invokana later in an attempt to improve her sugars but with only limited success. Because of continued poor control with subcutaneous insulin and large insulin requirement she was switched to a Medtronic insulin pump which she started on 07/29/13 Prior to starting insulin pump her blood sugar at home was averaging 273 With the U 500 insulin in the pump her blood sugars were dramatically better with rare hypoglycemia   RECENT history:  She was switched to NovoLog insulin in her pump instead of U-500 insulin last month in 3/15 because of an episode of severe overnight hypoglycemia Initially she had fairly good blood sugars were in the last 2 weeks his blood sugars have been extremely erratic at all times. Although she is checking her blood sugars frequently there is usually no consistent pattern Also her overall average blood sugar is nearly 180 now Have discussed her daily diabetes management and problems identified are as follows:  She is not able to take care of her needs to manage her diabetes well because of taking care of her sick family member  She will not plan her meals well and may sometimes have balanced  meals  She admits that sometimes she will forget the bolus when she is eating and a bolus may be given even couple of hours later when she remembers and blood sugar is significantly high  Variable readings in the mornings although recently better including a glucose of 61  Mostly high readings late in the evening after about  8 PM and her basal rate is reduced at about 9 PM  Pump settings  Basal rate midnight = 0.6, 3 AM = 0.7, 8 AM = 1.5, 11 AM = 3.5, 5 PM = 2.5 and 9 PM = 1.5. Carbohydrate coverage 1:5, correction 1: 25 and target 110-120, insulin duration 4 hours BASAL total amount = 45 units, bolus percent use 23 %  GLUCOSE readings: She is checking blood sugars 6.5 times a day  PREMEAL Breakfast Lunch Dinner Bedtime Overall  Glucose range:  61-238   77-235   62-265   118-328    Mean/median:     220   177+/-74    Meals: 3 meals per day, at 11 am, 3 pm and 7 pm Carbohydrate intake: 75 g daily on average.  Food preferences: She has oatmeal or cereal and juice,  toast  occasionally with egg, usually has a sandwich and salad for lunch and vegetables for dinner with fish or lean meat for protein. Will snack on apples, raisons or wheat thins.   Wt Readings from Last 3 Encounters:  12/01/13 189 lb 6.4 oz (85.911 kg)  11/17/13 190 lb 8 oz (86.41 kg)  11/16/13 191 lb 4.8 oz (86.773 kg)   Physical activity: exercise: Irregular now with taking care of family members; was at the gym for  60 min, 5/7 days a week.  Dietician visit: Most recent: 2012  Retinal exam: Most recent: 2010.   Complications: No retinopathy or known nephropathy.    Lab Results  Component Value Date   HGBA1C 7.2* 10/09/2013   HGBA1C 10.2* 08/03/2013   HGBA1C 10.3* 03/16/2013   Lab Results  Component Value Date   MICROALBUR 3.0* 03/16/2013   LDLCALC 99 11/13/2013   CREATININE 0.99 11/15/2013       Medication List       This list is accurate as of: 12/01/13 10:43 AM.  Always use your most recent med list.                ACCU-CHEK FASTCLIX LANCETS Misc     albuterol 108 (90 BASE) MCG/ACT inhaler  Commonly known as:  PROVENTIL HFA;VENTOLIN HFA  Inhale 2 puffs into the lungs every 6 (six) hours as needed for wheezing or shortness of breath. For shortness of breath     amLODipine 10 MG tablet  Commonly known as:  NORVASC  Take 10 mg by mouth daily.     aspirin 81 MG chewable tablet  Chew 81 mg by mouth daily.     azithromycin 250 MG tablet  Commonly known as:  ZITHROMAX  Take 250-500 mg by mouth See admin instructions. 2 tablets by mouth once and then 1 tablet daily x 4 days     cetirizine 10 MG tablet  Commonly known as:  ZYRTEC  Take 10 mg by mouth daily as needed for allergies.     colchicine 0.6 MG tablet  Take 0.6 mg by mouth 2 (two) times daily.     cyclobenzaprine 10 MG tablet  Commonly known as:  FLEXERIL  Take 10 mg by mouth 3 (three) times daily as needed for muscle spasms.     DEXILANT 60 MG capsule  Generic drug:  dexlansoprazole  Take 60 mg by mouth at bedtime. For acid reflex     docusate sodium 100 MG capsule  Commonly known as:  COLACE  Take 100 mg by mouth 2 (two) times daily as needed for mild constipation.     EPIPEN 2-PAK 0.3 mg/0.3 mL Soaj injection  Generic drug:  EPINEPHrine  Inject 0.3 mg into the muscle once.     gabapentin 300 MG capsule  Commonly known as:  NEURONTIN  Take 300 mg by mouth 2 (two) times daily as needed (for pain). If needed can use up to tid     glucagon 1 MG injection  Commonly known as:  GLUCAGON EMERGENCY  Inject 1 mg into the vein once as needed.     glucose blood test strip  Commonly known as:  ACCU-CHEK SMARTVIEW  Use as instructed to check blood sugars 3 times per day dx code 250.00     hydrOXYzine 25 MG tablet  Commonly known as:  ATARAX/VISTARIL  Take 50 mg by mouth 2 (two) times daily as needed for itching. For itching     insulin aspart 100 UNIT/ML injection  Commonly known as:  NOVOLOG  Use max 35 units per day  with insulin pump     LEVEMIR FLEXTOUCH 100 UNIT/ML Pen  Generic drug:  Insulin Detemir     losartan-hydrochlorothiazide 100-25 MG per tablet  Commonly known as:  HYZAAR  Take 1 tablet by mouth daily.  meloxicam 15 MG tablet  Commonly known as:  MOBIC     methylPREDNISolone 4 MG tablet  Commonly known as:  MEDROL DOSEPAK  Take 4-32 mg by mouth See admin instructions. Taper dose.  follow package directions     mirabegron ER 50 MG Tb24 tablet  Commonly known as:  MYRBETRIQ  Take 50 mg by mouth daily as needed (for bladder).     multivitamin with minerals Tabs tablet  Take 1 tablet by mouth daily.     nitrofurantoin 100 MG capsule  Commonly known as:  MACRODANTIN  Take 100 mg by mouth 2 (two) times daily.     oxyCODONE-acetaminophen 5-325 MG per tablet  Commonly known as:  PERCOCET/ROXICET  1-2 tablets every 6 (six) hours as needed for moderate pain. 1 to 2 tablets every 6 hours as needed for pain.     potassium chloride SA 20 MEQ tablet  Commonly known as:  K-DUR,KLOR-CON  Take 1 tablet (20 mEq total) by mouth daily.     pravastatin 40 MG tablet  Commonly known as:  PRAVACHOL  Take 40 mg by mouth daily.     salsalate 750 MG tablet  Commonly known as:  DISALCID  Take 1,500 mg by mouth 2 (two) times daily. 2 tabs twice daily with food for arthritis     spironolactone 25 MG tablet  Commonly known as:  ALDACTONE  Take 25 mg by mouth daily.     trimethoprim 100 MG tablet  Commonly known as:  TRIMPEX  Take 100 mg by mouth daily.     VICTOZA 18 MG/3ML Soln injection  Generic drug:  Liraglutide  Inject 1.2 mg into the skin every morning.        Allergies:  Allergies  Allergen Reactions  . Codeine Itching and Nausea And Vomiting    Past Medical History  Diagnosis Date  . Diabetes mellitus   . Hypertension   . Arthritis   . Fibromyalgia     Past Surgical History  Procedure Laterality Date  . Cholecystectomy    . Colon surgery    . Esophagus dilated     . Arthroscopic knee surgery    . Carpal tunnel release Bilateral     Family History  Problem Relation Age of Onset  . Diabetes Mother   . Hypertension Mother   . Cancer Mother   . Diabetes Father   . Hypertension Father   . Diabetes Brother     Social History:  reports that she has never smoked. She has never used smokeless tobacco. She reports that she does not drink alcohol or use illicit drugs.  Review of Systems   She sees Eye Dr in 5/15 for followup   HYPERTENSION:  blood pressure is high today . To followup with PCP   No history of recent pedal edema No history of burning or tingling in her feet HYPERLIPIDEMIA: Calculated LDL is now at goal with using regularly pravastatin prescribed by PCP  Lab Results  Component Value Date   CHOL 178 11/13/2013   HDL 58.30 11/13/2013   LDLCALC 99 11/13/2013   LDLDIRECT 152.8 10/09/2013   TRIG 105.0 11/13/2013   CHOLHDL 3 11/13/2013     Examination:   BP 162/86  Pulse 88  Temp(Src) 97.9 F (36.6 C)  Resp 14  Ht 5\' 4"  (1.626 m)  Wt 189 lb 6.4 oz (85.911 kg)  BMI 32.49 kg/m2  SpO2 95%  Body mass index is 32.49 kg/(m^2).     Assesment/PLAN:  Diabetes type 2, uncontrolled  Her blood sugars initially were better with starting to use NovoLog in the pump but now are getting more erratic. Most of this is from her not being able to be consistent with diet, boluses and daily routine because of family illness and stress See history of present illness for detailed discussion of current management and problems She has however not had any significant hypoglycemia; lowest blood sugar 61 yesterday morning Since it is difficult to identify any specific pattern except high readings late in the evening will not make significant changes in her settings except increase 9 PM basal rate by 0.1 She is also not exercising which normally helps her control Discussed day-to-day management and proper timing of boluses, balanced meals and trying to  restart exercise  Insulin pump settings  Basal rate midnight = 0.6, 3 AM = 0.7, 8 AM = 1.5, 11 AM = 3.5, 5 PM = 2.5 and 9 PM = 1.6. Carbohydrate coverage 1:5, correction 1: 25 and target 110-120, insulin duration 4 hours  Continue Invokana  And metformin  Hyperlipidemia: LDL better at 99  Hypertension: She will need followup with PCP as blood pressure is relatively high  Counseling time over 50% of today's 25 minute visit  Takhia Spoon 12/01/2013, 10:43 AM   Addendum:

## 2013-12-01 NOTE — Patient Instructions (Addendum)
9 PM = 1.6,  Check some readings at 3-4 am  Bolus right at mealtime

## 2013-12-04 ENCOUNTER — Encounter: Payer: Self-pay | Admitting: Advanced Practice Midwife

## 2013-12-04 ENCOUNTER — Ambulatory Visit (INDEPENDENT_AMBULATORY_CARE_PROVIDER_SITE_OTHER): Payer: PRIVATE HEALTH INSURANCE | Admitting: Advanced Practice Midwife

## 2013-12-04 VITALS — BP 162/79 | HR 87 | Temp 98.4°F | Ht 64.0 in | Wt 192.0 lb

## 2013-12-04 DIAGNOSIS — Z01419 Encounter for gynecological examination (general) (routine) without abnormal findings: Secondary | ICD-10-CM

## 2013-12-04 DIAGNOSIS — R2230 Localized swelling, mass and lump, unspecified upper limb: Secondary | ICD-10-CM

## 2013-12-04 DIAGNOSIS — B373 Candidiasis of vulva and vagina: Secondary | ICD-10-CM

## 2013-12-04 DIAGNOSIS — R1031 Right lower quadrant pain: Secondary | ICD-10-CM

## 2013-12-04 DIAGNOSIS — R229 Localized swelling, mass and lump, unspecified: Secondary | ICD-10-CM

## 2013-12-04 DIAGNOSIS — B3731 Acute candidiasis of vulva and vagina: Secondary | ICD-10-CM

## 2013-12-04 DIAGNOSIS — N390 Urinary tract infection, site not specified: Secondary | ICD-10-CM

## 2013-12-04 LAB — POCT URINALYSIS DIPSTICK
Bilirubin, UA: NEGATIVE
GLUCOSE UA: 1000
Ketones, UA: NEGATIVE
Leukocytes, UA: NEGATIVE
NITRITE UA: NEGATIVE
Protein, UA: NEGATIVE
RBC UA: NEGATIVE
Spec Grav, UA: 1.01
UROBILINOGEN UA: NEGATIVE
pH, UA: 5

## 2013-12-04 MED ORDER — FLUCONAZOLE 150 MG PO TABS: 150.0000 mg | ORAL_TABLET | Freq: Once | ORAL | Status: DC

## 2013-12-04 NOTE — Addendum Note (Signed)
Addended byRoni Bread, Nashia Remus H on: 12/04/2013 05:39 PM   Modules accepted: Orders

## 2013-12-04 NOTE — Progress Notes (Addendum)
Subjective:     [name] is a 63 y.o. female here for a routine exam. Ross Ludwig is her today w/ her partner.  Current complaints: Patient is in office for a problem visit. Patient states she is having issues with bloating and pain in her lower abdomen. Patient states it is a sharp shooting pain. Patient states the pain has been going on for about 3-4 months. Patient states it doubles her over in pain. Patient states she is also having pressure when urinating.     Patient reports mass on arm that has been there for years and is recently getting bigger.   Patient states she is anxious about being here today and her BP is possibly elevated due to that.  Gynecologic History No LMP recorded. Patient is postmenopausal. Contraception: abstinence and post menopausal status Last Pap: 2012. Results were: normal Last mammogram: 12/2012. Results were: normal  Obstetric History OB History  Gravida Para Term Preterm AB SAB TAB Ectopic Multiple Living  1 1 1       1     # Outcome Date GA Lbr Len/2nd Weight Sex Delivery Anes PTL Lv  1 TRM 11/09/73 [redacted]w[redacted]d  5 lb 4 oz (2.381 kg) M SVD None N Y     Past Medical History  Diagnosis Date  . Diabetes mellitus   . Hypertension   . Arthritis   . Fibromyalgia   . Hyperlipidemia    Family History  Problem Relation Age of Onset  . Diabetes Mother   . Hypertension Mother   . Cancer Mother   . Diabetes Father   . Hypertension Father   . Diabetes Brother       The following portions of the patient's history were reviewed and updated as appropriate: allergies, current medications, past family history, past medical history, past social history, past surgical history and problem list.  Review of Systems Pertinent items are noted in HPI.    Objective:    BP 162/79  Pulse 87  Temp(Src) 98.4 F (36.9 C)  Ht 5\' 4"  (1.626 m)  Wt 192 lb (87.091 kg)  BMI 32.94 kg/m2  General Appearance:    Alert, cooperative, no distress, appears stated age   Head:    Normocephalic, without obvious abnormality, atraumatic                                Abdomen:     Soft, non-tender, bowel sounds active all four quadrants,    no masses, no organomegaly  Genitalia:    Normal female without lesion, discharge, tenderness on right side palpated w/ exam. Perineum white discoloration on vulva, could be yeast vs. Lichen or skin pathology.  Rectal:    Normal tone, normal prostate, no masses or tenderness;   guaiac negative stool  Extremities:   Extremities normal, atraumatic, no cyanosis or edema, mass on left shoulder area approx 3.5 cm, fluid filled  Pulses:   2+ and symmetric all extremities  Skin:   Skin color, texture, turgor normal, no rashes or lesions  Lymph nodes:   Cervical, supraclavicular, and axillary nodes normal  Neurologic:   CNII-XII intact, normal strength, sensation and reflexes    throughout      Assessment:   Hypertension under care of PCP New onset of right lower quad pain w/ bloating x3 months Left shoulder/arm mass Glucose in Urine, Diabetic Discoloration of Vulva, suspected yeast infection unable to rule out other skin pathology  Plan:   Orders Placed This Encounter  Procedures  . WET PREP BY MOLECULAR PROBE  . Urine culture  . US Pelvis Complete    Standing Status: Future     Number of Occurrences:      Standing Expiration Date: 02/04/2015    Order Specific Question:  Reason for Exam (SYMPTOM  OR DIAGNOSIS REQUIRED)    Answer:  Right sided pelvic pain, bloating, rule out mass    Order Specific Question:  Preferred imaging location?    Answer:  [facility]  . Ambulatory referral to General Surgery    Referral Priority:  Routine    Referral Type:  Surgical    Referral Reason:  Specialty Services Required    Requested Specialty:  General Surgery    Number of Visits Requested:  1  . POCT urinalysis dipstick   Plan referral for mammogram.  Patient to f/u w/ PCP due to glucose in urine. Encourage  better DM management at next visits. Pap done w/ HPV, if negative next pap in 5 years. Suspected yeast infection, patient to f/u following diflucan treatment to see if skin discoloration has resolved. Consider MD referral or derm referral if unchanged.  MD Jodi Mourning consulted on arm mass and agrees to send patient to general surgery. Diflucan 150 mg PO one time   30 min spent with patient greater than 80% spent in counseling and coordination of care.  Shaquoia Miers Roni Bread CNM

## 2013-12-04 NOTE — Addendum Note (Signed)
Addended byRoni Bread, Duyen Beckom H on: 12/04/2013 05:32 PM   Modules accepted: Orders

## 2013-12-05 LAB — WET PREP BY MOLECULAR PROBE
CANDIDA SPECIES: POSITIVE — AB
GARDNERELLA VAGINALIS: NEGATIVE
TRICHOMONAS VAG: NEGATIVE

## 2013-12-06 LAB — URINE CULTURE: Colony Count: 50000

## 2013-12-07 ENCOUNTER — Encounter: Payer: Self-pay | Admitting: Advanced Practice Midwife

## 2013-12-07 LAB — PAP IG W/ RFLX HPV ASCU

## 2013-12-08 ENCOUNTER — Other Ambulatory Visit: Payer: Self-pay | Admitting: Advanced Practice Midwife

## 2013-12-08 DIAGNOSIS — N390 Urinary tract infection, site not specified: Secondary | ICD-10-CM

## 2013-12-08 DIAGNOSIS — Z1231 Encounter for screening mammogram for malignant neoplasm of breast: Secondary | ICD-10-CM

## 2013-12-08 MED ORDER — FLUCONAZOLE 150 MG PO TABS: 150.0000 mg | ORAL_TABLET | Freq: Once | ORAL | Status: DC

## 2013-12-08 MED ORDER — AMPICILLIN 500 MG PO CAPS: 500.0000 mg | ORAL_CAPSULE | Freq: Four times a day (QID) | ORAL | Status: DC

## 2013-12-10 ENCOUNTER — Encounter: Payer: Self-pay | Admitting: Advanced Practice Midwife

## 2013-12-11 ENCOUNTER — Ambulatory Visit (HOSPITAL_COMMUNITY)
Admission: RE | Admit: 2013-12-11 | Discharge: 2013-12-11 | Disposition: A | Discharge status: Home / Self Care | Payer: PRIVATE HEALTH INSURANCE | Source: Ambulatory Visit | Attending: Advanced Practice Midwife | Admitting: Advanced Practice Midwife

## 2013-12-11 ENCOUNTER — Other Ambulatory Visit: Payer: Self-pay | Admitting: Advanced Practice Midwife

## 2013-12-11 DIAGNOSIS — I10 Essential (primary) hypertension: Secondary | ICD-10-CM | POA: Diagnosis not present

## 2013-12-11 DIAGNOSIS — E119 Type 2 diabetes mellitus without complications: Secondary | ICD-10-CM | POA: Insufficient documentation

## 2013-12-11 DIAGNOSIS — R1031 Right lower quadrant pain: Secondary | ICD-10-CM

## 2013-12-15 ENCOUNTER — Other Ambulatory Visit: Payer: Self-pay | Admitting: Advanced Practice Midwife

## 2013-12-15 DIAGNOSIS — B373 Candidiasis of vulva and vagina: Secondary | ICD-10-CM

## 2013-12-15 DIAGNOSIS — B3731 Acute candidiasis of vulva and vagina: Secondary | ICD-10-CM

## 2013-12-15 MED ORDER — FLUCONAZOLE 150 MG PO TABS: 150.0000 mg | ORAL_TABLET | Freq: Once | ORAL | Status: DC

## 2013-12-21 ENCOUNTER — Other Ambulatory Visit: Payer: Self-pay | Admitting: *Deleted

## 2013-12-21 ENCOUNTER — Encounter (INDEPENDENT_AMBULATORY_CARE_PROVIDER_SITE_OTHER): Payer: Self-pay | Admitting: Surgery

## 2013-12-21 ENCOUNTER — Ambulatory Visit (INDEPENDENT_AMBULATORY_CARE_PROVIDER_SITE_OTHER): Payer: PRIVATE HEALTH INSURANCE | Admitting: Surgery

## 2013-12-21 VITALS — BP 148/82 | HR 80 | Temp 98.4°F | Ht 64.0 in | Wt 194.6 lb

## 2013-12-21 DIAGNOSIS — R2230 Localized swelling, mass and lump, unspecified upper limb: Secondary | ICD-10-CM

## 2013-12-21 DIAGNOSIS — R229 Localized swelling, mass and lump, unspecified: Secondary | ICD-10-CM

## 2013-12-21 DIAGNOSIS — Z9641 Presence of insulin pump (external) (internal): Secondary | ICD-10-CM

## 2013-12-21 NOTE — Patient Instructions (Signed)
Please consider the recommendations that we have given you today:  Consider outpatient surgery to remove the painful enlarging mass on your left arm.  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  See the Handout(s) we have given you.  Please call our office at [phone] if you wish to schedule surgery or if you have further questions / concerns.   Lipoma A lipoma is a noncancerous (benign) tumor composed of fat cells. They are usually found under the skin (subcutaneous). A lipoma may occur in any tissue of the body that contains fat. Common areas for lipomas to appear include the back, shoulders, buttocks, and thighs. Lipomas are a very common soft tissue growth. They are soft and grow slowly. Most problems caused by a lipoma depend on where it is growing. DIAGNOSIS  A lipoma can be diagnosed with a physical exam. These tumors rarely become cancerous, but radiographic studies can help determine this for certain. Studies used may include:  Computerized X-ray scans (CT or CAT scan).  Computerized magnetic scans (MRI). TREATMENT  Small lipomas that are not causing problems may be watched. If a lipoma continues to enlarge or causes problems, removal is often the best treatment. Lipomas can also be removed to improve appearance. Surgery is done to remove the fatty cells and the surrounding capsule. Most often, this is done with medicine that numbs the area (local anesthetic). The removed tissue is examined under a microscope to make sure it is not cancerous. Keep all follow-up appointments with your caregiver. SEEK MEDICAL CARE IF:   The lipoma becomes larger or hard.  The lipoma becomes painful, red, or increasingly swollen. These could be signs of infection or a more serious condition. Document Released: 08/03/2002 Document Revised: 11/05/2011 Document Reviewed: 01/13/2010 [facility] Patient Information 2014 [place], [place].  GETTING TO GOOD BOWEL HEALTH. Irregular bowel habits such as constipation and  diarrhea can lead to many problems over time.  Having one soft bowel movement a day is the most important way to prevent further problems.  The anorectal canal is designed to handle stretching and feces to safely manage our ability to get rid of solid waste (feces, poop, stool) out of our body.  BUT, hard constipated stools can act like ripping concrete bricks and diarrhea can be a burning fire to this very sensitive area of our body, causing inflamed hemorrhoids, anal fissures, increasing risk is perirectal abscesses, abdominal pain/bloating, an making irritable bowel worse.     The goal: ONE SOFT BOWEL MOVEMENT A DAY!  To have soft, regular bowel movements:    Drink at least 8 tall glasses of water a day.     Take plenty of fiber.  Fiber is the undigested part of plant food that passes into the colon, acting s "natures broom" to encourage bowel motility and movement.  Fiber can absorb and hold large amounts of water. This results in a larger, bulkier stool, which is soft and easier to pass. Work gradually over several weeks up to 6 servings a day of fiber (25g a day even more if needed) in the form of: o Vegetables -- Root (potatoes, carrots, turnips), leafy green (lettuce, salad greens, celery, spinach), or cooked high residue (cabbage, broccoli, etc) o Fruit -- Fresh (unpeeled skin & pulp), Dried (prunes, apricots, cherries, etc ),  or stewed ( applesauce)  o Whole grain breads, pasta, etc (whole wheat)  o Bran cereals    Bulking Agents -- This type of water-retaining fiber generally is easily obtained each day by  one of the following:  o Psyllium bran -- The psyllium plant is remarkable because its ground seeds can retain so much water. This product is available as Metamucil, Konsyl, Effersyllium, Per Diem Fiber, or the less expensive generic preparation in drug and health food stores. Although labeled a laxative, it really is not a laxative.  o Methylcellulose -- This is another fiber derived from  wood which also retains water. It is available as Citrucel. o Polyethylene Glycol - and "artificial" fiber commonly called Miralax or Glycolax.  It is helpful for people with gassy or bloated feelings with regular fiber o Flax Seed - a less gassy fiber than psyllium   No reading or other relaxing activity while on the toilet. If bowel movements take longer than 5 minutes, you are too constipated   AVOID CONSTIPATION.  High fiber and water intake usually takes care of this.  Sometimes a laxative is needed to stimulate more frequent bowel movements, but    Laxatives are not a good long-term solution as it can wear the colon out. o Osmotics (Milk of Magnesia, Fleets phosphosoda, Magnesium citrate, MiraLax, GoLytely) are safer than  o Stimulants (Senokot, Castor Oil, Dulcolax, Ex Lax)    o Do not take laxatives for more than 7days in a row.    IF SEVERELY CONSTIPATED, try a Bowel Retraining Program: o Do not use laxatives.  o Eat a diet high in roughage, such as bran cereals and leafy vegetables.  o Drink six (6) ounces of prune or apricot juice each morning.  o Eat two (2) large servings of stewed fruit each day.  o Take one (1) heaping tablespoon of a psyllium-based bulking agent twice a day. Use sugar-free sweetener when possible to avoid excessive calories.  o Eat a normal breakfast.  o Set aside 15 minutes after breakfast to sit on the toilet, but do not strain to have a bowel movement.  o If you do not have a bowel movement by the third day, use an enema and repeat the above steps.    Controlling diarrhea o Switch to liquids and simpler foods for a few days to avoid stressing your intestines further. o Avoid dairy products (especially milk & ice cream) for a short time.  The intestines often can lose the ability to digest lactose when stressed. o Avoid foods that cause gassiness or bloating.  Typical foods include beans and other legumes, cabbage, broccoli, and dairy foods.  Every person has  some sensitivity to other foods, so listen to our body and avoid those foods that trigger problems for you. o Adding fiber (Citrucel, Metamucil, psyllium, Miralax) gradually can help thicken stools by absorbing excess fluid and retrain the intestines to act more normally.  Slowly increase the dose over a few weeks.  Too much fiber too soon can backfire and cause cramping & bloating. o Probiotics (such as active yogurt, Align, etc) may help repopulate the intestines and colon with normal bacteria and calm down a sensitive digestive tract.  Most studies show it to be of mild help, though, and such products can be costly. o Medicines:   Bismuth subsalicylate (ex. Kayopectate, Pepto Bismol) every 30 minutes for up to 6 doses can help control diarrhea.  Avoid if pregnant.   Loperamide (Immodium) can slow down diarrhea.  Start with two tablets (4mg  total) first and then try one tablet every 6 hours.  Avoid if you are having fevers or severe pain.  If you are not better or start feeling  worse, stop all medicines and call your doctor for advice o Call your doctor if you are getting worse or not better.  Sometimes further testing (cultures, endoscopy, X-ray studies, bloodwork, etc) may be needed to help diagnose and treat the cause of the diarrhea.  Managing Pain  Pain after surgery or related to activity is often due to strain/injury to muscle, tendon, nerves and/or incisions.  This pain is usually short-term and will improve in a few months.   Many people find it helpful to do the following things TOGETHER to help speed the process of healing and to get back to regular activity more quickly:  1. Avoid heavy physical activity a.  no lifting greater than 20 pounds b. Do not "push through" the pain.  Listen to your body and avoid positions and maneuvers than reproduce the pain c. Walking is okay as tolerated, but go slowly and stop when getting sore.  d. Remember: If it hurts to do it, then don't do  it! 2. Take Anti-inflammatory medication  a. Take with food/snack around the clock for 1-2 weeks i. This helps the muscle and nerve tissues become less irritable and calm down faster b. Choose ONE of the following over-the-counter medications: i. Naproxen 220mg  tabs (ex. Aleve) 1-2 pills twice a day  ii. Ibuprofen 200mg  tabs (ex. Advil, Motrin) 3-4 pills with every meal and just before bedtime iii. Acetaminophen 500mg  tabs (Tylenol) 1-2 pills with every meal and just before bedtime 3. Use a Heating pad or Ice/Cold Pack a. 4-6 times a day b. May use warm bath/hottub  or showers 4. Try Gentle Massage and/or Stretching  a. at the area of pain many times a day b. stop if you feel pain - do not overdo it  Try these steps together to help you body heal faster and avoid making things get worse.  Doing just one of these things may not be enough.    If you are not getting better after two weeks or are noticing you are getting worse, contact our office for further advice; we may need to re-evaluate you & see what other things we can do to help.

## 2013-12-21 NOTE — Progress Notes (Signed)
Subjective:     Patient ID: [name], female   DOB: 04/19/1951, 63 y.o.   MRN: [id number]  HPI  Note: This dictation was prepared with Dragon/digital dictation along with [employer] technology. Any transcriptional errors that result from this process are unintentional.       [name]  April 09, 1951 [id number]  Patient Care Team: [name], MD as PCP - General (Internal Medicine)  This patient is a 63 y.o.female who presents today for surgical evaluation at the request of Michaelyn Barter, CNM.   Reason for visit: Left upper extremity mass  Pleasant woman with numerous health issues Including insulin requiring diabetes with insulin pump, fibromyalgia, chronic pain.  Apparently has had a mass on her left upper arm/shoulder for years.  Ultrasound done in 2011 that did not show any discrete mass.  Lipoma suspected.    Based on concerns, surgical consultation requested.  She comes in today with a friend/significant other.  The patient feels it has gotten larger. The mass causes tenderness/aching.  Sensitive to pressure/touch/arm abduction.  She wonders if it is intermittently swollen and intermittently Conrad.  Is never drained.  Heart to lie on it.  Its irritated when touched or rolled on.  Sort to lift her left arm as well.  Occasional paresthesias down the arm as well to    Patient Active Problem List   Diagnosis Date Noted  . Rhabdomyolysis 11/15/2013  . Elevated lactic acid level 11/15/2013  . Hypoglycemia 11/15/2013  . Hypokalemia 11/15/2013  . Other and unspecified hyperlipidemia 08/06/2013  . Type II or unspecified type diabetes mellitus without mention of complication, uncontrolled 07/20/2013  . Varicose veins of lower extremities with other complications 94/70/9628  . Fibromyalgia   . GERD 05/18/2010  . URINARY TRACT INFECTION, MONILIAL 05/05/2010  . UTI 05/02/2010  . ABDOMINAL PAIN, GENERALIZED 05/02/2010  . OBESITY 04/20/2010  . BURSITIS, LEFT  SHOULDER 03/09/2010  . CANDIDIASIS OF VULVA AND VAGINA 01/10/2010  . LIPOMA 01/10/2010  . DEPRESSION 12/27/2009  . BACK PAIN WITH RADICULOPATHY 12/27/2009  . CYSTITIS, ACUTE 10/21/2009  . MICROSCOPIC HEMATURIA 10/18/2009  . KNEE PAIN, BILATERAL 10/18/2009  . NEUROPATHY 09/08/2009  . HYPERTENSION, BENIGN ESSENTIAL 09/08/2009  . ASTHMA 09/08/2009  . STRESS INCONTINENCE 09/08/2009  . CHEST PAIN 07/19/2009  . ESOPHAGEAL STRICTURE 08/27/2005  . DIABETES MELLITUS 08/27/1978    Past Medical History  Diagnosis Date  . Diabetes mellitus   . Hypertension   . Arthritis   . Fibromyalgia   . Hyperlipidemia     Past Surgical History  Procedure Laterality Date  . Cholecystectomy    . Colon surgery    . Esophagus dilated    . Arthroscopic knee surgery    . Carpal tunnel release Bilateral     History   Social History  . Marital Status: Divorced    Spouse Name: N/A    Number of Children: N/A  . Years of Education: N/A   Occupational History  . Not on file.   Social History Main Topics  . Smoking status: Never Smoker   . Smokeless tobacco: Never Used  . Alcohol Use: No  . Drug Use: No  . Sexual Activity: Not Currently   Other Topics Concern  . Not on file   Social History Narrative  . No narrative on file    Family History  Problem Relation Age of Onset  . Diabetes Mother   . Hypertension Mother   . Cancer Mother   . Diabetes Father   .  Hypertension Father   . Diabetes Brother     Current Outpatient Prescriptions  Medication Sig Dispense Refill  . albuterol (PROVENTIL HFA;VENTOLIN HFA) 108 (90 BASE) MCG/ACT inhaler Inhale 2 puffs into the lungs every 6 (six) hours as needed for wheezing or shortness of breath. For shortness of breath      . amLODipine (NORVASC) 10 MG tablet Take 10 mg by mouth daily.      [name] ampicillin (PRINCIPEN) 500 MG capsule Take 1 capsule (500 mg total) by mouth 4 (four) times daily. 1 po QID x10 days  28 capsule  0  . aspirin 81 MG  chewable tablet Chew 81 mg by mouth daily.      . cetirizine (ZYRTEC) 10 MG tablet Take 10 mg by mouth daily as needed for allergies.       . cyclobenzaprine (FLEXERIL) 10 MG tablet Take 10 mg by mouth 3 (three) times daily as needed for muscle spasms.      [name] dexlansoprazole (DEXILANT) 60 MG capsule Take 60 mg by mouth at bedtime. For acid reflex      . docusate sodium (COLACE) 100 MG capsule Take 100 mg by mouth 2 (two) times daily as needed for mild constipation.      [name] EPIPEN 2-PAK 0.3 MG/0.3ML SOAJ injection Inject 0.3 mg into the muscle once.       . fluconazole (DIFLUCAN) 150 MG tablet Take 1 tablet (150 mg total) by mouth once.  1 tablet  0  . fluconazole (DIFLUCAN) 150 MG tablet Take 1 tablet (150 mg total) by mouth once. Take prescription following antibiotic treatment.  1 tablet  0  . fluconazole (DIFLUCAN) 150 MG tablet Take 1 tablet (150 mg total) by mouth once.  1 tablet  0  . gabapentin (NEURONTIN) 300 MG capsule Take 300 mg by mouth 2 (two) times daily as needed (for pain). If needed can use up to tid      . glucagon (GLUCAGON EMERGENCY) 1 MG injection Inject 1 mg into the vein once as needed.  1 each  12  . hydrOXYzine (ATARAX/VISTARIL) 25 MG tablet Take 50 mg by mouth 2 (two) times daily as needed for itching. For itching      . insulin aspart (NOVOLOG) 100 UNIT/ML injection Use max 35 units per day with insulin pump  10 mL  3  . LEVEMIR FLEXTOUCH 100 UNIT/ML Pen       . Liraglutide (VICTOZA) 18 MG/3ML SOLN Inject 1.2 mg into the skin every morning.       [name] losartan-hydrochlorothiazide (HYZAAR) 100-25 MG per tablet Take 1 tablet by mouth daily.      . meloxicam (MOBIC) 15 MG tablet       . Multiple Vitamin (MULITIVITAMIN WITH MINERALS) TABS Take 1 tablet by mouth daily.      . nitrofurantoin (MACRODANTIN) 100 MG capsule Take 100 mg by mouth 2 (two) times daily.      [name] oxyCODONE-acetaminophen (PERCOCET/ROXICET) 5-325 MG per tablet 1-2 tablets every 6 (six) hours as needed for  moderate pain. 1 to 2 tablets every 6 hours as needed for pain.      . potassium chloride SA (K-DUR,KLOR-CON) 20 MEQ tablet Take 1 tablet (20 mEq total) by mouth daily.  5 tablet  0  . pravastatin (PRAVACHOL) 40 MG tablet Take 40 mg by mouth daily.       [name] spironolactone (ALDACTONE) 25 MG tablet Take 25 mg by mouth daily.       [name] trimethoprim (  TRIMPEX) 100 MG tablet Take 100 mg by mouth daily.        No current facility-administered medications for this visit.     Allergies  Allergen Reactions  . Codeine Itching and Nausea And Vomiting    BP 148/82  Pulse 80  Temp(Src) 98.4 F (36.9 C)  Ht 5\' 4"  (1.626 m)  Wt 194 lb 9.6 oz (88.27 kg)  BMI 33.39 kg/m2  Clinical Data: Enlarging lesion over the left shoulder.  ULTRASOUND LEFT UPPER EXTREMITY LIMITED 03/02/2010 Technique: Ultrasound examination of the upper arm was performed  including evaluation of the muscles, tendons, joint, and adjacent  soft tissues.  Comparison: None.  Findings: Imaging demonstrates normal appearing subcutaneous fatty  tissues in the region of concern over the left deltoid muscle. No  discrete mass is identified. The patient may have an  unencapsulated lipoma. Muscular tissues appear normal. There is  no focal fluid collection.  IMPRESSION:  Findings most consistent with an unencapsulated lipoma of the left  upper arm.   US Transvaginal Non-ob  12/11/2013   CLINICAL DATA:  Right lower quadrant pain, history hypertension, diabetes  EXAM: TRANSABDOMINAL AND TRANSVAGINAL ULTRASOUND OF PELVIS  TECHNIQUE: Both transabdominal and transvaginal ultrasound examinations of the pelvis were performed. Transabdominal technique was performed for global imaging of the pelvis including uterus, ovaries, adnexal regions, and pelvic cul-de-sac. It was necessary to proceed with endovaginal exam following the transabdominal exam to visualize the endometrium, ovaries and adnexae.  COMPARISON:  CT pelvis 12/09/2013  FINDINGS: Uterus   Measurements: 6.1 x 3.0 x 3.7 cm. Normal morphology without mass.  Endometrium  Thickness: 2 mm thick, normal.  Minimal endocervical fluid.  Right ovary  Measurements: 2.1 x 1.3 x 1.6 cm. Normal morphology without mass.  Left ovary  Not visualized on either transabdominal or endovaginal imaging, suspect related to obscuration by bowel loops in adnexa.  Other findings  No free pelvic fluid or adnexal masses.  IMPRESSION: Nonvisualization of left ovary.  No significant findings identified.   Electronically Signed   By: Lavonia Dana M.D.   On: 12/11/2013 16:06   US Pelvis Complete  12/11/2013   CLINICAL DATA:  Right lower quadrant pain, history hypertension, diabetes  EXAM: TRANSABDOMINAL AND TRANSVAGINAL ULTRASOUND OF PELVIS  TECHNIQUE: Both transabdominal and transvaginal ultrasound examinations of the pelvis were performed. Transabdominal technique was performed for global imaging of the pelvis including uterus, ovaries, adnexal regions, and pelvic cul-de-sac. It was necessary to proceed with endovaginal exam following the transabdominal exam to visualize the endometrium, ovaries and adnexae.  COMPARISON:  CT pelvis 12/09/2013  FINDINGS: Uterus  Measurements: 6.1 x 3.0 x 3.7 cm. Normal morphology without mass.  Endometrium  Thickness: 2 mm thick, normal.  Minimal endocervical fluid.  Right ovary  Measurements: 2.1 x 1.3 x 1.6 cm. Normal morphology without mass.  Left ovary  Not visualized on either transabdominal or endovaginal imaging, suspect related to obscuration by bowel loops in adnexa.  Other findings  No free pelvic fluid or adnexal masses.  IMPRESSION: Nonvisualization of left ovary.  No significant findings identified.   Electronically Signed   By: Lavonia Dana M.D.   On: 12/11/2013 16:06   CLINICAL DATA: Hypotension, causing the patient to lose  consciousness earlier tonight.  EXAM:  CHEST 2 VIEW  COMPARISON: DG CHEST 2 VIEW dated 09/23/2013; CT ANGIO CHEST W/CM  &/OR WO/CM dated 06/12/2012; DG  CHEST 2 VIEW dated 06/12/2012; DG  CHEST 2 VIEW dated 12/18/2011  FINDINGS:  Cardiac silhouette normal  in size, unchanged. Thoracic aorta mildly  atherosclerotic, unchanged. Hilar and mediastinal contours otherwise  unremarkable. Lungs clear. Bronchovascular markings normal.  Pulmonary vascularity normal. No visible pleural effusions. No  pneumothorax. Degenerative changes involving the thoracic spine. No  significant interval change.  IMPRESSION:  No acute cardiopulmonary disease. Stable examination.  Electronically Signed  By: Evangeline Dakin M.D.  On: 11/15/2013 02:24   Review of Systems  Constitutional: Negative for fever, chills and diaphoresis.  HENT: Negative for ear pain, sore throat and trouble swallowing.   Eyes: Negative for photophobia and visual disturbance.  Respiratory: Negative for cough and choking.   Cardiovascular: Negative for chest pain and palpitations.  Gastrointestinal: Positive for abdominal pain and abdominal distention. Negative for nausea, vomiting, diarrhea, constipation, anal bleeding and rectal pain.  Genitourinary: Negative for dysuria, frequency and difficulty urinating.  Musculoskeletal: Negative for gait problem and myalgias.  Skin: Negative for color change, pallor and rash.  Neurological: Negative for dizziness, speech difficulty, weakness and numbness.  Hematological: Negative for adenopathy.  Psychiatric/Behavioral: Negative for confusion and agitation. The patient is not nervous/anxious.        Objective:   Physical Exam  Constitutional: She is oriented to person, place, and time. She appears well-developed and well-nourished. No distress.  HENT:  Head: Normocephalic.  Mouth/Throat: Oropharynx is clear and moist. No oropharyngeal exudate.  Eyes: Conjunctivae and EOM are normal. Pupils are equal, round, and reactive to light. No scleral icterus.  Neck: Normal range of motion. No tracheal deviation present.  Cardiovascular: Normal rate and  intact distal pulses.   Pulmonary/Chest: Effort normal. No respiratory distress. She exhibits no tenderness.  Abdominal: Soft. She exhibits no distension. There is no tenderness. Hernia confirmed negative in the right inguinal area and confirmed negative in the left inguinal area.    Genitourinary: No vaginal discharge found.  Musculoskeletal: Normal range of motion. She exhibits no tenderness.       Arms: Lymphadenopathy:       Right: No inguinal adenopathy present.       Left: No inguinal adenopathy present.  Neurological: She is alert and oriented to person, place, and time. No cranial nerve deficit. She exhibits normal muscle tone. Coordination normal.  Skin: Skin is warm and dry. No rash noted. She is not diaphoretic.  Psychiatric: Her speech is normal and behavior is normal. Judgment and thought content normal. Her mood appears anxious. Cognition and memory are normal.  Consolable        Assessment:     SQ mass (Probable lipoma) of left mid arm - very sensitive    Plan:     Think it is reasonable to remove.  Too large and too sensitive to try and do under local leg.  Suspect outpatient acse w general anesthesia/LMA.   Supine vs. Right-sided down decubitus.  Most likely will need drain.  I discussed the patient and her significant other:  The pathophysiology of skin & subcutaneous masses was discussed.  Natural history risks without surgery were discussed.  I recommended surgery to remove the mass.  I explained the technique of removal with use of local anesthesia & possible need for more aggressive sedation/anesthesia for patient comfort.    Risks such as bleeding, infection, heart attack, death, and other risks were discussed.  I noted a good likelihood this will help address the problem.   Possibility that this will not correct all symptoms was explained. Possibility of regrowth/recurrence of the mass was discussed.  We will work to minimize complications. Questions  were  answered.  The patient expresses understanding & wishes to proceed with surgery.

## 2013-12-29 ENCOUNTER — Ambulatory Visit
Admission: RE | Admit: 2013-12-29 | Discharge: 2013-12-29 | Disposition: A | Discharge status: Home / Self Care | Payer: PRIVATE HEALTH INSURANCE | Source: Ambulatory Visit | Attending: Advanced Practice Midwife | Admitting: Advanced Practice Midwife

## 2013-12-29 ENCOUNTER — Telehealth: Payer: Self-pay | Admitting: Endocrinology

## 2013-12-29 DIAGNOSIS — Z1231 Encounter for screening mammogram for malignant neoplasm of breast: Secondary | ICD-10-CM

## 2013-12-29 NOTE — Telephone Encounter (Signed)
Pt needs contour test strips but they are still not supplying them to her please assist

## 2013-12-31 ENCOUNTER — Other Ambulatory Visit: Payer: Self-pay | Admitting: *Deleted

## 2013-12-31 MED ORDER — GLUCOSE BLOOD VI STRP: ORAL_STRIP | Status: DC

## 2014-01-01 ENCOUNTER — Telehealth: Payer: Self-pay | Admitting: *Deleted

## 2014-01-01 ENCOUNTER — Other Ambulatory Visit: Payer: Self-pay | Admitting: *Deleted

## 2014-01-01 MED ORDER — INSULIN ASPART 100 UNIT/ML ~~LOC~~ SOLN: SUBCUTANEOUS | Status: DC

## 2014-01-01 NOTE — Telephone Encounter (Signed)
rx sent, patient is aware 

## 2014-01-01 NOTE — Telephone Encounter (Signed)
Patient called saying one vial of insulin is not getting her through the month, she wants to know if it's okay to call in 2 vials a month.  Please advise CB # [phone]

## 2014-01-01 NOTE — Telephone Encounter (Signed)
Yes she can have 2 vials of NovoLog, using 60 units a day

## 2014-01-12 ENCOUNTER — Other Ambulatory Visit (INDEPENDENT_AMBULATORY_CARE_PROVIDER_SITE_OTHER): Payer: PRIVATE HEALTH INSURANCE

## 2014-01-12 DIAGNOSIS — E1165 Type 2 diabetes mellitus with hyperglycemia: Principal | ICD-10-CM

## 2014-01-12 DIAGNOSIS — IMO0001 Reserved for inherently not codable concepts without codable children: Secondary | ICD-10-CM

## 2014-01-12 LAB — COMPREHENSIVE METABOLIC PANEL
ALK PHOS: 80 U/L (ref 39–117)
ALT: 18 U/L (ref 0–35)
AST: 27 U/L (ref 0–37)
Albumin: 3.4 g/dL — ABNORMAL LOW (ref 3.5–5.2)
BILIRUBIN TOTAL: 0.9 mg/dL (ref 0.2–1.2)
BUN: 21 mg/dL (ref 6–23)
CO2: 31 mEq/L (ref 19–32)
Calcium: 9 mg/dL (ref 8.4–10.5)
Chloride: 95 mEq/L — ABNORMAL LOW (ref 96–112)
Creatinine, Ser: 1.6 mg/dL — ABNORMAL HIGH (ref 0.4–1.2)
GFR: 43.32 mL/min — ABNORMAL LOW (ref >60.00)
Glucose, Bld: 185 mg/dL — ABNORMAL HIGH (ref 70–99)
Potassium: 3.4 mEq/L — ABNORMAL LOW (ref 3.5–5.1)
SODIUM: 133 meq/L — AB (ref 135–145)
TOTAL PROTEIN: 7.2 g/dL (ref 6.0–8.3)

## 2014-01-12 LAB — HEMOGLOBIN A1C: Hgb A1c MFr Bld: 7.9 % — ABNORMAL HIGH (ref 4.6–6.5)

## 2014-01-20 ENCOUNTER — Encounter: Payer: Self-pay | Admitting: Endocrinology

## 2014-01-20 ENCOUNTER — Ambulatory Visit (INDEPENDENT_AMBULATORY_CARE_PROVIDER_SITE_OTHER): Payer: PRIVATE HEALTH INSURANCE | Admitting: Endocrinology

## 2014-01-20 ENCOUNTER — Other Ambulatory Visit: Payer: Self-pay | Admitting: *Deleted

## 2014-01-20 VITALS — BP 134/78 | HR 99 | Temp 97.8°F | Resp 14 | Ht 64.0 in | Wt 194.8 lb

## 2014-01-20 DIAGNOSIS — E1165 Type 2 diabetes mellitus with hyperglycemia: Secondary | ICD-10-CM

## 2014-01-20 DIAGNOSIS — IMO0001 Reserved for inherently not codable concepts without codable children: Secondary | ICD-10-CM

## 2014-01-20 DIAGNOSIS — N289 Disorder of kidney and ureter, unspecified: Secondary | ICD-10-CM

## 2014-01-20 MED ORDER — PRAVASTATIN SODIUM 40 MG PO TABS: 40.0000 mg | ORAL_TABLET | Freq: Every day | ORAL | Status: DC

## 2014-01-20 NOTE — Progress Notes (Signed)
Patient ID: [name], female   DOB: [birth year], 63 y.o.   MRN: [id number]   Reason for Appointment: Diabetes follow-up   History of Present Illness   Diagnosis: Type 2, date of diagnosis:1985.    PAST history: She has had difficult to control diabetes requiring large doses of insulin especially at meal times. Because of poor control she was switched from NovoLog to U-500 insulin in 11/2012. At that time her A1c was 9%  Initially seemed to have better blood sugars. With no clear response with Invokana this was stopped and she was started on Actos. Her insulin was adjusted with higher mealtime coverage at breakfast and lunch and lower amounts at supper. Lantus requirement appeared to be more in the morning and evening dose was eventually stopped. She  has been given Victoza and Invokana later in an attempt to improve her sugars but with only limited success. Because of continued poor control with subcutaneous insulin and large insulin requirement she was switched to a [employer] insulin pump which she started on 07/29/13 Prior to starting insulin pump her blood sugar at home was averaging 273 With the U 500 insulin in the pump her blood sugars were dramatically better with rare hypoglycemia   RECENT history:   Off Invokana 6-7 weeks  She was switched to NovoLog insulin in her pump instead of U-500 insulin in 3/15 because of an episode of severe overnight hypoglycemia Although she did have some minor adjustments made in 4/50 blood sugars are still overall high, averaging 175 compared to 180 and also her A1c is going up Current blood sugar patterns:  Her sugars are mostly high early morning, averaging about 170  Blood sugars are relatively better late morning but tending to be higher after lunch and slowly declining until suppertime.   Blood sugars are fairly good between 8 PM-midnight but higher right after midnight  PREMEAL Breakfast Lunch Dinner  PCS  Overall  Glucose range:   139-217   135-269   113-219   138-302    Mean/median:      175+/-54    Have reviewed her daily diabetes management and problems identified are as follows:  She is not bolusing enough and only 1.5 times a day  She was told by a friend not to bolus if the blood sugar is normal when she is eating  She will sometimes suspend her pump when she is taking a bath and not restart it for up to 4 hours  Still having some stressful family situations and not clear if this is affecting her control  Not bolusing for all high readings when she requires correction  Although she is exercising some she is not doing this very often  Not taking Invokana and is tending to gain weight  Pump settings  Basal rate midnight = 0.6, 3 AM = 0.7, 8 AM = 1.5, 11 AM = 3.5, 5 PM = 2.5 and 9 PM = 1.6. Carbohydrate coverage 1:5, correction 1: 25 and target 110-120, insulin duration 4 hours  Bolus events: 1.5 per day, 81% with correction BASAL total amount = 43 units, bolus percent use 20 %  Meals: 3 meals per day, at 11 am, 3 pm and 7 pm Carbohydrate intake: 75 g daily on average.  Food preferences: She has oatmeal or cereal and juice,  toast  occasionally with egg, usually has a sandwich and salad for lunch and vegetables for dinner with fish or lean meat for protein. Will snack on apples, raisons or  wheat thins.   Wt Readings from Last 3 Encounters:  01/20/14 194 lb 12.8 oz (88.361 kg)  12/21/13 194 lb 9.6 oz (88.27 kg)  12/04/13 192 lb (87.091 kg)   Physical activity: exercise: About twice a week  Dietician visit: Most recent: 2012  Retinal exam: Most recent: 2010.   Complications: No retinopathy or known nephropathy.    Lab Results  Component Value Date   HGBA1C 7.9* 01/12/2014   HGBA1C 7.2* 10/09/2013   HGBA1C 10.2* 08/03/2013   Lab Results  Component Value Date   MICROALBUR 3.0* 03/16/2013   LDLCALC 99 11/13/2013   CREATININE 1.6* 01/12/2014       Medication List       This list is accurate as  of: 01/20/14  2:17 PM.  Always use your most recent med list.               ACCU-CHEK FASTCLIX LANCETS Misc     ADVAIR DISKUS 250-50 MCG/DOSE Aepb  Generic drug:  Fluticasone-Salmeterol     albuterol 108 (90 BASE) MCG/ACT inhaler  Commonly known as:  PROVENTIL HFA;VENTOLIN HFA  Inhale 2 puffs into the lungs every 6 (six) hours as needed for wheezing or shortness of breath. For shortness of breath     amLODipine 10 MG tablet  Commonly known as:  NORVASC  Take 10 mg by mouth daily.     ampicillin 500 MG capsule  Commonly known as:  PRINCIPEN  Take 1 capsule (500 mg total) by mouth 4 (four) times daily. 1 po QID x10 days     aspirin 81 MG chewable tablet  Chew 81 mg by mouth daily.     cetirizine 10 MG tablet  Commonly known as:  ZYRTEC  Take 10 mg by mouth daily as needed for allergies.     cyclobenzaprine 10 MG tablet  Commonly known as:  FLEXERIL  Take 10 mg by mouth 3 (three) times daily as needed for muscle spasms.     DEXILANT 60 MG capsule  Generic drug:  dexlansoprazole  Take 60 mg by mouth at bedtime. For acid reflex     docusate sodium 100 MG capsule  Commonly known as:  COLACE  Take 100 mg by mouth 2 (two) times daily as needed for mild constipation.     EPIPEN 2-PAK 0.3 mg/0.3 mL Soaj injection  Generic drug:  EPINEPHrine  Inject 0.3 mg into the muscle once.     gabapentin 300 MG capsule  Commonly known as:  NEURONTIN  Take 300 mg by mouth 2 (two) times daily as needed (for pain). If needed can use up to tid     glucagon 1 MG injection  Commonly known as:  GLUCAGON EMERGENCY  Inject 1 mg into the vein once as needed.     glucose blood test strip  Commonly known as:  BAYER CONTOUR NEXT TEST  Use as instructed to check blood sugar 5 times per day dx code 250.00     hydrOXYzine 25 MG tablet  Commonly known as:  ATARAX/VISTARIL  Take 50 mg by mouth 2 (two) times daily as needed for itching. For itching     insulin aspart 100 UNIT/ML injection   Commonly known as:  NOVOLOG  Use max 60 units per day with insulin pump     LEVEMIR FLEXTOUCH 100 UNIT/ML Pen  Generic drug:  Insulin Detemir  Inject 15 Units into the skin daily at 10 pm.     losartan-hydrochlorothiazide 100-25 MG per tablet  Commonly  known as:  HYZAAR  Take 1 tablet by mouth daily.     multivitamin with minerals Tabs tablet  Take 1 tablet by mouth daily.     nitrofurantoin 100 MG capsule  Commonly known as:  MACRODANTIN  Take 100 mg by mouth 2 (two) times daily.     oxyCODONE-acetaminophen 5-325 MG per tablet  Commonly known as:  PERCOCET/ROXICET  1-2 tablets every 6 (six) hours as needed for moderate pain. 1 to 2 tablets every 6 hours as needed for pain.     potassium chloride SA 20 MEQ tablet  Commonly known as:  K-DUR,KLOR-CON  Take 1 tablet (20 mEq total) by mouth daily.     pravastatin 40 MG tablet  Commonly known as:  PRAVACHOL  Take 1 tablet (40 mg total) by mouth daily.     spironolactone 25 MG tablet  Commonly known as:  ALDACTONE  Take 25 mg by mouth daily.     trimethoprim 100 MG tablet  Commonly known as:  TRIMPEX  Take 100 mg by mouth daily.     VICTOZA 18 MG/3ML Soln injection  Generic drug:  Liraglutide  Inject 1.2 mg into the skin every morning.        Allergies:  Allergies  Allergen Reactions  . Codeine Itching and Nausea And Vomiting    Past Medical History  Diagnosis Date  . Diabetes mellitus   . Hypertension   . Arthritis   . Fibromyalgia   . Hyperlipidemia     Past Surgical History  Procedure Laterality Date  . Cholecystectomy    . Colon surgery    . Esophagus dilated    . Arthroscopic knee surgery    . Carpal tunnel release Bilateral     Family History  Problem Relation Age of Onset  . Diabetes Mother   . Hypertension Mother   . Cancer Mother   . Diabetes Father   . Hypertension Father   . Diabetes Brother     Social History:  reports that she has never smoked. She has never used smokeless  tobacco. She reports that she does not drink alcohol or use illicit drugs.  Review of Systems   Creatinine worse at 1.6: She has taking meloxicam only occasionally recently and not any other nonsteroidal drugs. Not on any diuretics   Lab Results  Component Value Date   CREATININE 1.6* 01/12/2014    She sees ophthalmologist regularly  HYPERTENSION:  blood pressure is better today .   No history of recent pedal edema  HYPERLIPIDEMIA: Calculated LDL is  at goal with using regularly pravastatin prescribed by PCP  Lab Results  Component Value Date   CHOL 178 11/13/2013   HDL 58.30 11/13/2013   LDLCALC 99 11/13/2013   LDLDIRECT 152.8 10/09/2013   TRIG 105.0 11/13/2013   CHOLHDL 3 11/13/2013     Examination:   BP 134/78  Pulse 99  Temp(Src) 97.8 F (36.6 C)  Resp 14  Ht 5\' 4"  (1.626 m)  Wt 194 lb 12.8 oz (88.361 kg)  BMI 33.42 kg/m2  SpO2 97%  Body mass index is 33.42 kg/(m^2).   No ankle edema  Assesment/PLAN:   Diabetes type 2, uncontrolled  Her blood sugars are overall higher and has not had any recent hypoglycemia A1c has gone up to 7.9 and she has gone up on her weight a little, not clear this is from stopping Invokana See history of present illness for detailed discussion of current management, blood sugar patterns and problems Current  impressions:  She appears to need a higher basal rate early morning and some late at night. Will increase overnight basal rate to 0.7 and start her on 0.5 basal rate at 70 of instead of 8 AM  Most likely needs to get more compliant with bolusing especially for correcting high readings as well as sticking boluses when the blood sugars are normal  She needs to cover all meals and snacks consistently even when blood sugar is normal  Consider restarting, and creatinine is back to normal  No change in Victoza  Discussed day-to-day management and proper timing of boluses, balanced meals and trying to restart exercise  Renal  insufficiency: She will need followup with PCP for evaluation  Counseling time over 50% of today's 25 minute visit  [name] 01/20/2014, 2:17 PM

## 2014-01-20 NOTE — Patient Instructions (Addendum)
Bolus for all meals and snacks  Change basal rate as directed  NO MELOXICAM

## 2014-01-27 ENCOUNTER — Other Ambulatory Visit: Payer: Self-pay | Admitting: *Deleted

## 2014-01-27 MED ORDER — POTASSIUM CHLORIDE CRYS ER 20 MEQ PO TBCR: 20.0000 meq | EXTENDED_RELEASE_TABLET | Freq: Every day | ORAL | Status: DC

## 2014-01-29 ENCOUNTER — Other Ambulatory Visit (INDEPENDENT_AMBULATORY_CARE_PROVIDER_SITE_OTHER): Payer: Self-pay

## 2014-01-29 ENCOUNTER — Other Ambulatory Visit (INDEPENDENT_AMBULATORY_CARE_PROVIDER_SITE_OTHER): Payer: Self-pay | Admitting: Surgery

## 2014-01-29 DIAGNOSIS — D1739 Benign lipomatous neoplasm of skin and subcutaneous tissue of other sites: Secondary | ICD-10-CM

## 2014-01-29 HISTORY — PX: OTHER SURGICAL HISTORY: SHX169

## 2014-01-29 MED ORDER — OXYCODONE-ACETAMINOPHEN 5-325 MG PO TABS: 1.0000 | ORAL_TABLET | Freq: Four times a day (QID) | ORAL | Status: DC | PRN

## 2014-02-04 ENCOUNTER — Telehealth (INDEPENDENT_AMBULATORY_CARE_PROVIDER_SITE_OTHER): Payer: Self-pay

## 2014-02-04 NOTE — Telephone Encounter (Signed)
Message copied by Illene Regulus on Thu Feb 04, 2014 12:22 PM ------      Message from: Adin Hector      Created: Mon Feb 01, 2014  1:10 PM       Pathology shows benign results: Lipoma removed from left upper arm              Pollie Poma, CCS MA, please call on the patient to make sure recovery is going well & tell pt the good news on the pathology.  Thanks,            Adin Hector, M.D., F.A.C.S.      Gastrointestinal and Minimally Invasive Surgery      [facility]      [address] [place]      [place], [place] [address]      [phone] Main / Paging            ! ------

## 2014-02-04 NOTE — Telephone Encounter (Signed)
LMOM for pt to call. I wanted to give her the good news on pathology results showing benign results lipoma removed from left upper arm per Dr Johney Maine.

## 2014-02-10 ENCOUNTER — Other Ambulatory Visit: Payer: Self-pay | Admitting: Urology

## 2014-02-11 ENCOUNTER — Encounter (INDEPENDENT_AMBULATORY_CARE_PROVIDER_SITE_OTHER): Payer: Self-pay | Admitting: Surgery

## 2014-02-11 ENCOUNTER — Ambulatory Visit (INDEPENDENT_AMBULATORY_CARE_PROVIDER_SITE_OTHER): Payer: PRIVATE HEALTH INSURANCE | Admitting: Surgery

## 2014-02-11 VITALS — BP 136/74 | HR 75 | Temp 98.5°F | Ht 64.0 in | Wt 195.0 lb

## 2014-02-11 DIAGNOSIS — R2230 Localized swelling, mass and lump, unspecified upper limb: Secondary | ICD-10-CM

## 2014-02-11 DIAGNOSIS — R229 Localized swelling, mass and lump, unspecified: Secondary | ICD-10-CM

## 2014-02-11 NOTE — Progress Notes (Signed)
Subjective:     Patient ID: [name], female   DOB: Aug 21, 1951, 63 y.o.   MRN: [id number]  HPI  Note: Portions of this report may have been transcribed using voice recognition software. Every effort was made to ensure accuracy; however, inadvertent computerized transcription errors may be present.   Any transcriptional errors that result from this process are unintentional.            MICKAELA STARLIN  01-Sep-1950 [id number]  Patient Care Team: [name], MD as PCP - General (Internal Medicine) Elayne Snare, MD as Consulting Physician (Endocrinology)  Procedure: Cystoscopy/12/2013.  Excision of left arm mass with placement of drain.  Pathology: Mature adipose tissue consistent with lipoma.  The patient comes in today feeling well.  Here with her friend.  Sensitivity much better than preop.  Drain put out 15 mL a day for several days.  No fevers or chills.  Using her arm well.  Better than preoperatively.  She is in good spirits the  Patient Active Problem List   Diagnosis Date Noted  . Insulin pump in place 12/21/2013  . Rhabdomyolysis 11/15/2013  . Elevated lactic acid level 11/15/2013  . Hypoglycemia 11/15/2013  . Hypokalemia 11/15/2013  . Other and unspecified hyperlipidemia 08/06/2013  . Type II or unspecified type diabetes mellitus without mention of complication, uncontrolled 07/20/2013  . Varicose veins of lower extremities with other complications 34/74/2595  . Fibromyalgia   . GERD 05/18/2010  . URINARY TRACT INFECTION, MONILIAL 05/05/2010  . UTI 05/02/2010  . ABDOMINAL PAIN, GENERALIZED 05/02/2010  . OBESITY 04/20/2010  . BURSITIS, LEFT SHOULDER 03/09/2010  . CANDIDIASIS OF VULVA AND VAGINA 01/10/2010  . Mass of left arm - 8x5x5cm, enlarging 01/10/2010  . DEPRESSION 12/27/2009  . BACK PAIN WITH RADICULOPATHY 12/27/2009  . CYSTITIS, ACUTE 10/21/2009  . MICROSCOPIC HEMATURIA 10/18/2009  . KNEE PAIN, BILATERAL 10/18/2009  . NEUROPATHY  09/08/2009  . HYPERTENSION, BENIGN ESSENTIAL 09/08/2009  . ASTHMA 09/08/2009  . STRESS INCONTINENCE 09/08/2009  . CHEST PAIN 07/19/2009  . ESOPHAGEAL STRICTURE 08/27/2005  . DIABETES MELLITUS 08/27/1978    Past Medical History  Diagnosis Date  . Diabetes mellitus   . Hypertension   . Arthritis   . Fibromyalgia   . Hyperlipidemia     Past Surgical History  Procedure Laterality Date  . Cholecystectomy    . Colon surgery    . Esophagus dilated    . Arthroscopic knee surgery    . Carpal tunnel release Bilateral     History   Social History  . Marital Status: Divorced    Spouse Name: N/A    Number of Children: N/A  . Years of Education: N/A   Occupational History  . Not on file.   Social History Main Topics  . Smoking status: Never Smoker   . Smokeless tobacco: Never Used  . Alcohol Use: No  . Drug Use: No  . Sexual Activity: Not Currently   Other Topics Concern  . Not on file   Social History Narrative  . No narrative on file    Family History  Problem Relation Age of Onset  . Diabetes Mother   . Hypertension Mother   . Cancer Mother   . Diabetes Father   . Hypertension Father   . Diabetes Brother     Current Outpatient Prescriptions  Medication Sig Dispense Refill  . ACCU-CHEK FASTCLIX LANCETS MISC       . ADVAIR DISKUS 250-50 MCG/DOSE AEPB       .  albuterol (PROVENTIL HFA;VENTOLIN HFA) 108 (90 BASE) MCG/ACT inhaler Inhale 2 puffs into the lungs every 6 (six) hours as needed for wheezing or shortness of breath. For shortness of breath      . amLODipine (NORVASC) 10 MG tablet Take 10 mg by mouth daily.      [name] ampicillin (PRINCIPEN) 500 MG capsule Take 1 capsule (500 mg total) by mouth 4 (four) times daily. 1 po QID x10 days  28 capsule  0  . aspirin 81 MG chewable tablet Chew 81 mg by mouth daily.      . cetirizine (ZYRTEC) 10 MG tablet Take 10 mg by mouth daily as needed for allergies.       . cyclobenzaprine (FLEXERIL) 10 MG tablet Take 10 mg by  mouth 3 (three) times daily as needed for muscle spasms.      [name] dexlansoprazole (DEXILANT) 60 MG capsule Take 60 mg by mouth at bedtime. For acid reflex      . docusate sodium (COLACE) 100 MG capsule Take 100 mg by mouth 2 (two) times daily as needed for mild constipation.      [name] EPIPEN 2-PAK 0.3 MG/0.3ML SOAJ injection Inject 0.3 mg into the muscle once.       . gabapentin (NEURONTIN) 300 MG capsule Take 300 mg by mouth 2 (two) times daily as needed (for pain). If needed can use up to tid      . glucagon (GLUCAGON EMERGENCY) 1 MG injection Inject 1 mg into the vein once as needed.  1 each  12  . glucose blood (BAYER CONTOUR NEXT TEST) test strip Use as instructed to check blood sugar 5 times per day dx code 250.00  250 each  3  . hydrOXYzine (ATARAX/VISTARIL) 25 MG tablet Take 50 mg by mouth 2 (two) times daily as needed for itching. For itching      . insulin aspart (NOVOLOG) 100 UNIT/ML injection Use max 60 units per day with insulin pump  2 vial  3  . LEVEMIR FLEXTOUCH 100 UNIT/ML Pen Inject 15 Units into the skin daily at 10 pm.       . Liraglutide (VICTOZA) 18 MG/3ML SOLN Inject 1.2 mg into the skin every morning.       [name] losartan-hydrochlorothiazide (HYZAAR) 100-25 MG per tablet Take 1 tablet by mouth daily.      . Multiple Vitamin (MULITIVITAMIN WITH MINERALS) TABS Take 1 tablet by mouth daily.      . nitrofurantoin (MACRODANTIN) 100 MG capsule Take 100 mg by mouth 2 (two) times daily.      [name] oxyCODONE-acetaminophen (PERCOCET/ROXICET) 5-325 MG per tablet 1-2 tablets every 6 (six) hours as needed for moderate pain. 1 to 2 tablets every 6 hours as needed for pain.      [name] oxyCODONE-acetaminophen (ROXICET) 5-325 MG per tablet Take 1 tablet by mouth every 6 (six) hours as needed.  30 tablet  0  . potassium chloride SA (K-DUR,KLOR-CON) 20 MEQ tablet Take 1 tablet (20 mEq total) by mouth daily.  30 tablet  5  . pravastatin (PRAVACHOL) 40 MG tablet Take 1 tablet (40 mg total) by mouth daily.  30  tablet  3  . spironolactone (ALDACTONE) 25 MG tablet Take 25 mg by mouth daily.       [name] trimethoprim (TRIMPEX) 100 MG tablet Take 100 mg by mouth daily.        No current facility-administered medications for this visit.     Allergies  Allergen Reactions  . Codeine  Itching and Nausea And Vomiting    BP 136/74  Pulse 75  Temp(Src) 98.5 F (36.9 C)  Ht 5\' 4"  (1.626 m)  Wt 195 lb (88.451 kg)  BMI 33.46 kg/m2  No results found. '  Review of Systems  Constitutional: Negative for fever, chills and diaphoresis.  HENT: Negative for ear pain, sore throat and trouble swallowing.   Eyes: Negative for photophobia and visual disturbance.  Respiratory: Negative for cough and choking.   Cardiovascular: Negative for chest pain and palpitations.  Gastrointestinal: Negative for nausea, vomiting, abdominal pain, diarrhea, constipation, anal bleeding and rectal pain.  Genitourinary: Negative for dysuria, frequency and difficulty urinating.  Musculoskeletal: Negative for gait problem and myalgias.  Skin: Negative for color change, pallor and rash.  Neurological: Negative for dizziness, speech difficulty, weakness and numbness.  Hematological: Negative for adenopathy.  Psychiatric/Behavioral: Negative for confusion and agitation. The patient is not nervous/anxious.        Objective:   Physical Exam  Constitutional: She is oriented to person, place, and time. She appears well-developed and well-nourished. No distress.  HENT:  Head: Normocephalic.  Mouth/Throat: Oropharynx is clear and moist. No oropharyngeal exudate.  Eyes: Conjunctivae and EOM are normal. Pupils are equal, round, and reactive to light. No scleral icterus.  Neck: Normal range of motion. No tracheal deviation present.  Cardiovascular: Normal rate and intact distal pulses.   Pulmonary/Chest: Effort normal. No respiratory distress. She exhibits no tenderness.  Abdominal: Soft. She exhibits no distension. There is no  tenderness. Hernia confirmed negative in the right inguinal area and confirmed negative in the left inguinal area.  Genitourinary: No vaginal discharge found.  Musculoskeletal: Normal range of motion. She exhibits no tenderness.       Arms: Lymphadenopathy:       Right: No inguinal adenopathy present.       Left: No inguinal adenopathy present.  Neurological: She is alert and oriented to person, place, and time. No cranial nerve deficit. She exhibits normal muscle tone. Coordination normal.  Skin: Skin is warm and dry. No rash noted. She is not diaphoretic.  Psychiatric: She has a normal mood and affect. Her behavior is normal.       Assessment:     Improved status post resection of left arm mass consistent with lipoma.     Plan:     I am glad she is feeling better.  Certainly her arm is much less sensitive.  Incision has a normal healing ridge.  I am hopeful she will continue to improve.  Already is better than preop.  Increase activity as tolerated to regular activity.  Low impact exercise such as walking an hour a day at least ideal.  Do not push through pain.  We will have a tentative appointment in 3 weeks to make sure she continues to improve.  If she is much better, she can make it just as needed.   Instructions discussed.  Followup with primary care physician for other health issues as would normally be done.  Consider screening for malignancies (breast, prostate, colon, melanoma, etc) as appropriate.  Questions answered.  The patient expressed understanding and appreciation

## 2014-02-11 NOTE — Patient Instructions (Signed)
GENERAL SURGERY: POST OP INSTRUCTIONS ° °1. DIET: Follow a light bland diet the first 24 hours after arrival home, such as soup, liquids, crackers, etc.  Be sure to include lots of fluids daily.  Avoid fast food or heavy meals as your are more likely to get nauseated.   °2. Take your usually prescribed home medications unless otherwise directed. °3. PAIN CONTROL: °a. Pain is best controlled by a usual combination of three different methods TOGETHER: °i. Ice/Heat °ii. Over the counter pain medication °iii. Prescription pain medication °b. Most patients will experience some swelling and bruising around the incisions.  Ice packs or heating pads (30-60 minutes up to 6 times a day) will help. Use ice for the first few days to help decrease swelling and bruising, then switch to heat to help relax tight/sore spots and speed recovery.  Some people prefer to use ice alone, heat alone, alternating between ice & heat.  Experiment to what works for you.  Swelling and bruising can take several weeks to resolve.   °c. It is helpful to take an over-the-counter pain medication regularly for the first few weeks.  Choose one of the following that works best for you: °i. Naproxen (Aleve, etc)  Two 220mg tabs twice a day °ii. Ibuprofen (Advil, etc) Three 200mg tabs four times a day (every meal & bedtime) °iii. Acetaminophen (Tylenol, etc) 500-650mg four times a day (every meal & bedtime) °d. A  prescription for pain medication (such as oxycodone, hydrocodone, etc) should be given to you upon discharge.  Take your pain medication as prescribed.  °i. If you are having problems/concerns with the prescription medicine (does not control pain, nausea, vomiting, rash, itching, etc), please call us [phone] to see if we need to switch you to a different pain medicine that will work better for you and/or control your side effect better. °ii. If you need a refill on your pain medication, please contact your pharmacy.  They will contact our  office to request authorization. Prescriptions will not be filled after 5 pm or on week-ends. °4. Avoid getting constipated.  Between the surgery and the pain medications, it is common to experience some constipation.  Increasing fluid intake and taking a fiber supplement (such as Metamucil, Citrucel, FiberCon, MiraLax, etc) 1-2 times a day regularly will usually help prevent this problem from occurring.  A mild laxative (prune juice, Milk of Magnesia, MiraLax, etc) should be taken according to package directions if there are no bowel movements after 48 hours.   °5. Wash / shower every day.  You may shower over the dressings as they are waterproof.  Continue to shower over incision(s) after the dressing is off. °6. Remove your waterproof bandages 5 days after surgery.  You may leave the incision open to air.  You may have skin tapes (Steri Strips) covering the incision(s).  Leave them on until one week, then remove.  You may replace a dressing/Band-Aid to cover the incision for comfort if you wish.  ° ° ° ° °7. ACTIVITIES as tolerated:   °a. You may resume regular (light) daily activities beginning the next day--such as daily self-care, walking, climbing stairs--gradually increasing activities as tolerated.  If you can walk 30 minutes without difficulty, it is safe to try more intense activity such as jogging, treadmill, bicycling, low-impact aerobics, swimming, etc. °b. Save the most intensive and strenuous activity for last such as sit-ups, heavy lifting, contact sports, etc  Refrain from any heavy lifting or straining until you   are off narcotics for pain control.   °c. DO NOT PUSH THROUGH PAIN.  Let pain be your guide: If it hurts to do something, don't do it.  Pain is your body warning you to avoid that activity for another week until the pain goes down. °d. You may drive when you are no longer taking prescription pain medication, you can comfortably wear a seatbelt, and you can safely maneuver your car and  apply brakes. °e. You may have sexual intercourse when it is comfortable.  °8. FOLLOW UP in our office °a. Please call [facility] at [phone] to set up an appointment to see your surgeon in the office for a follow-up appointment approximately 2-3 weeks after your surgery. °b. Make sure that you call for this appointment the day you arrive home to insure a convenient appointment time. °9. IF YOU HAVE DISABILITY OR FAMILY LEAVE FORMS, BRING THEM TO THE OFFICE FOR PROCESSING.  DO NOT GIVE THEM TO YOUR DOCTOR. ° ° °WHEN TO CALL US [phone]: °1. Poor pain control °2. Reactions / problems with new medications (rash/itching, nausea, etc)  °3. Fever over 101.5 F (38.5 C) °4. Worsening swelling or bruising °5. Continued bleeding from incision. °6. Increased pain, redness, or drainage from the incision °7. Difficulty breathing / swallowing ° ° The clinic staff is available to answer your questions during regular business hours (8:30am-5pm).  Please don’t hesitate to call and ask to speak to one of our nurses for clinical concerns.  ° If you have a medical emergency, go to the nearest emergency room or call 911. ° A surgeon from [facility] is always on call at the hospitals ° ° °[facility] °[address], [address], [place], [place]  [address] ? °MAIN: [phone] ? TOLL FREE: [phone] ?  °FAX [phone] °[email or web address] ° °

## 2014-03-03 ENCOUNTER — Encounter (HOSPITAL_BASED_OUTPATIENT_CLINIC_OR_DEPARTMENT_OTHER): Payer: Self-pay | Admitting: *Deleted

## 2014-03-04 ENCOUNTER — Encounter (HOSPITAL_BASED_OUTPATIENT_CLINIC_OR_DEPARTMENT_OTHER): Payer: Self-pay | Admitting: *Deleted

## 2014-03-04 NOTE — Progress Notes (Signed)
NPO AFTER MN. ARRIVE AT 0915.  NEEDS T & S.  PT TO GET OTHER LAB WORK DONE  Friday 03-05-2014.  CURRENT EKG IN CHART AND EPIC. WILL TAKE PRAVASTATIN AM DOS W/ SIPS OF WATER

## 2014-03-05 DIAGNOSIS — E7800 Pure hypercholesterolemia, unspecified: Secondary | ICD-10-CM | POA: Diagnosis not present

## 2014-03-05 DIAGNOSIS — K219 Gastro-esophageal reflux disease without esophagitis: Secondary | ICD-10-CM | POA: Diagnosis not present

## 2014-03-05 DIAGNOSIS — Z794 Long term (current) use of insulin: Secondary | ICD-10-CM | POA: Diagnosis not present

## 2014-03-05 DIAGNOSIS — N393 Stress incontinence (female) (male): Secondary | ICD-10-CM | POA: Diagnosis present

## 2014-03-05 DIAGNOSIS — E119 Type 2 diabetes mellitus without complications: Secondary | ICD-10-CM | POA: Diagnosis not present

## 2014-03-05 DIAGNOSIS — I10 Essential (primary) hypertension: Secondary | ICD-10-CM | POA: Diagnosis not present

## 2014-03-05 DIAGNOSIS — N35919 Unspecified urethral stricture, male, unspecified site: Secondary | ICD-10-CM | POA: Diagnosis not present

## 2014-03-05 DIAGNOSIS — Z79899 Other long term (current) drug therapy: Secondary | ICD-10-CM | POA: Diagnosis not present

## 2014-03-05 DIAGNOSIS — F3289 Other specified depressive episodes: Secondary | ICD-10-CM | POA: Diagnosis not present

## 2014-03-05 DIAGNOSIS — F329 Major depressive disorder, single episode, unspecified: Secondary | ICD-10-CM | POA: Diagnosis not present

## 2014-03-05 DIAGNOSIS — J45909 Unspecified asthma, uncomplicated: Secondary | ICD-10-CM | POA: Diagnosis not present

## 2014-03-05 DIAGNOSIS — K449 Diaphragmatic hernia without obstruction or gangrene: Secondary | ICD-10-CM | POA: Diagnosis not present

## 2014-03-05 DIAGNOSIS — E669 Obesity, unspecified: Secondary | ICD-10-CM | POA: Diagnosis not present

## 2014-03-05 DIAGNOSIS — IMO0001 Reserved for inherently not codable concepts without codable children: Secondary | ICD-10-CM | POA: Diagnosis not present

## 2014-03-05 LAB — BASIC METABOLIC PANEL
Anion gap: 13 (ref 5–15)
BUN: 15 mg/dL (ref 6–23)
CALCIUM: 9.5 mg/dL (ref 8.4–10.5)
CO2: 24 mEq/L (ref 19–32)
Chloride: 104 mEq/L (ref 96–112)
Creatinine, Ser: 1.54 mg/dL — ABNORMAL HIGH (ref 0.50–1.10)
GFR, EST AFRICAN AMERICAN: 41 mL/min — AB (ref >90)
GFR, EST NON AFRICAN AMERICAN: 35 mL/min — AB (ref >90)
GLUCOSE: 174 mg/dL — AB (ref 70–99)
Potassium: 3.8 mEq/L (ref 3.7–5.3)
Sodium: 141 mEq/L (ref 137–147)

## 2014-03-05 LAB — CBC
HCT: 40.5 % (ref 36.0–46.0)
Hemoglobin: 13.4 g/dL (ref 12.0–15.0)
MCH: 30 pg (ref 26.0–34.0)
MCHC: 33.1 g/dL (ref 30.0–36.0)
MCV: 90.8 fL (ref 78.0–100.0)
PLATELETS: 280 10*3/uL (ref 150–400)
RBC: 4.46 MIL/uL (ref 3.87–5.11)
RDW: 12.6 % (ref 11.5–15.5)
WBC: 6 10*3/uL (ref 4.0–10.5)

## 2014-03-05 LAB — APTT: aPTT: 30 seconds (ref 24–37)

## 2014-03-05 LAB — PROTIME-INR
INR: 1 (ref 0.00–1.49)
Prothrombin Time: 13.2 seconds (ref 11.6–15.2)

## 2014-03-08 ENCOUNTER — Encounter (INDEPENDENT_AMBULATORY_CARE_PROVIDER_SITE_OTHER): Payer: PRIVATE HEALTH INSURANCE | Admitting: Surgery

## 2014-03-08 NOTE — H&P (Signed)
History of Present Illness   Gwendolyn [name] is on daily trimethoprim for chronic cystitis. She had high leak-point pressures of 155 cmH2O, though she leaked a significant amount. Her maximum capacity is 550 mL and her bladder was stable. She had significant mixed incontinence as previously noted. She is an insulin-dependent diabetic and can get up 6 times at night. She failed Myrbetriq and Lisbeth Ply the last time I saw her.  I gave her VESIcare. She says she leaks a small amount with coughing and other times a larger amount. She thinks her cough problem is more of a problem than her urge incontinence. When she goes from the living room to the bathroom or when she is carrying groceries, she has urge incontinence which is significant. She did have some voiding dysfunction on urodynamics and has been on insulin for more than 30 years. I was going to discuss a sling next time and make certain she understands that she could have persisting or worsening flow symptoms afterwards, or OAB symptoms. I thought a sling would be likely better than neuromodulation moving forward. I was going to mention neuromodulation therapies if her symptoms persisted postoperatively.  Review of Systems: No change in bowel or neurologic systems.    Past Medical History Problems  1. History of arthritis (V13.4) 2. History of asthma (V12.69) 3. History of depression (V11.8) 4. History of diabetes mellitus (V12.29) 5. History of gastroesophageal reflux (GERD) (V12.79) 6. History of glaucoma (V12.49) 7. History of gout (V12.29) 8. History of hypercholesterolemia (V12.29) 9. History of hypertension (V12.59)  Surgical History Problems  1. History of Cholecystectomy 2. History of Knee Surgery 3. History of Throat Surgery 4. History of Wrist Surgery  Current Meds 1. AmLODIPine Besylate 10 MG Oral Tablet;  Therapy: (Recorded:09Feb2015) to Recorded 2. Dexilant 60 MG Oral Capsule Delayed Release;  Therapy: (Recorded:09Feb2015) to  Recorded 3. Gabapentin TABS;  Therapy: (Recorded:09Feb2015) to Recorded 4. HumuLIN R U-500 (CONCENTRATED) SOLN;  Therapy: (Recorded:09Feb2015) to Recorded 5. Losartan Potassium-HCTZ 100-25 MG Oral Tablet;  Therapy: (Recorded:09Feb2015) to Recorded 6. Myrbetriq 50 MG Oral Tablet Extended Release 24 Hour;  Therapy: (Recorded:09Feb2015) to Recorded 7. Nitrofurantoin Macrocrystal 100 MG Oral Capsule; Take 1 capsule twice daily;  Therapy: [id number] to (Evaluate:23Mar2015)  Requested for: [id number]; Last  Rx:16Mar2015 Ordered 8. OxyCODONE HCl - 5 MG Oral Tablet;  Therapy: [phone] to Recorded 9. Pravastatin Sodium 40 MG Oral Tablet;  Therapy: (Recorded:09Feb2015) to Recorded 10. Spironolactone 25 MG Oral Tablet;   Therapy: (Recorded:09Feb2015) to Recorded 11. Trimethoprim 100 MG Oral Tablet; 100 mg daily;   Therapy: [id number] to (Last Rx:12Mar2015)  Requested for: [id number] Ordered 12. Victoza SOLN;   Therapy: (Recorded:09Feb2015) to Recorded  Allergies Medication  1. Codeine Derivatives  Family History Problems  1. Family history of diabetes mellitus (V18.0) : Mother, Father, Brother 2. Family history of hypertension (V17.49) : Mother 3. Family history of lung cancer (V16.1) : Brother  Social History Problems  1. Denied: History of Alcohol use 2. Caffeine use (V49.89)   4 3. Disabled 4. Divorced 5. Never a smoker  Vitals Vital Signs [Data Includes: Last 1 Day]  Recorded: 12Jun2015 10:40AM  Height: 5 ft 4 in Weight: 191 lb  BMI Calculated: 32.79 BSA Calculated: 1.92 Blood Pressure: 165 / 68, Sitting Temperature: 98.5 F, Oral Heart Rate: 96 Respiration: 18  Assessment Assessed  1. Chronic cystitis (595.2) 2. Urge and stress incontinence (788.33)  Plan  Chronic cystitis, Increased urinary frequency, Urge and stress incontinence  1. Start: VESIcare 5 MG Oral Tablet;  Take 1 tablet daily Urge and stress incontinence  2. Follow-up Schedule Surgery Office   Follow-up  Status: Complete  Done: 12Jun2015  Discussion/Summary   [name] would like to proceed with a sling. I did mention neuromodulation treatments for worsening symptoms postoperatively. Again, we talked about high risk of retention especially with her diabetic bladder and flow symptoms. She was pleased with our discussions, also including the mesh issues. She really thinks the VESIcare is helping her nocturia. She says she is still leaking a lot during the day and she says when she coughs she can leak a large amount now. She can get up only twice at night. I gave her more VESIcare 5 mg samples and a prescription of 30 tablets x11. If she remembers, she is going to stop it 3 days prior to surgery.   [name] will remain on trimethoprim and she was not clinically infected today.  After a thorough review of the management options for the patient's condition the patient  elected to proceed with surgical therapy as noted above. We have discussed the potential benefits and risks of the procedure, side effects of the proposed treatment, the likelihood of the patient achieving the goals of the procedure, and any potential problems that might occur during the procedure or recuperation. Informed consent has been obtained.

## 2014-03-09 ENCOUNTER — Encounter (HOSPITAL_BASED_OUTPATIENT_CLINIC_OR_DEPARTMENT_OTHER): Payer: PRIVATE HEALTH INSURANCE | Admitting: Anesthesiology

## 2014-03-09 ENCOUNTER — Ambulatory Visit (HOSPITAL_BASED_OUTPATIENT_CLINIC_OR_DEPARTMENT_OTHER)
Admission: RE | Admit: 2014-03-09 | Discharge: 2014-03-09 | Disposition: A | Discharge status: Home / Self Care | Payer: PRIVATE HEALTH INSURANCE | Source: Ambulatory Visit | Attending: Urology | Admitting: Urology

## 2014-03-09 ENCOUNTER — Encounter (HOSPITAL_BASED_OUTPATIENT_CLINIC_OR_DEPARTMENT_OTHER)
Admission: RE | Disposition: A | Discharge status: Home / Self Care | Payer: Self-pay | Source: Ambulatory Visit | Attending: Urology

## 2014-03-09 ENCOUNTER — Ambulatory Visit (HOSPITAL_BASED_OUTPATIENT_CLINIC_OR_DEPARTMENT_OTHER): Payer: PRIVATE HEALTH INSURANCE | Admitting: Anesthesiology

## 2014-03-09 ENCOUNTER — Encounter (HOSPITAL_BASED_OUTPATIENT_CLINIC_OR_DEPARTMENT_OTHER): Payer: Self-pay | Admitting: *Deleted

## 2014-03-09 DIAGNOSIS — Z794 Long term (current) use of insulin: Secondary | ICD-10-CM | POA: Insufficient documentation

## 2014-03-09 DIAGNOSIS — Z79899 Other long term (current) drug therapy: Secondary | ICD-10-CM | POA: Insufficient documentation

## 2014-03-09 DIAGNOSIS — K219 Gastro-esophageal reflux disease without esophagitis: Secondary | ICD-10-CM | POA: Diagnosis not present

## 2014-03-09 DIAGNOSIS — K449 Diaphragmatic hernia without obstruction or gangrene: Secondary | ICD-10-CM | POA: Insufficient documentation

## 2014-03-09 DIAGNOSIS — N393 Stress incontinence (female) (male): Secondary | ICD-10-CM | POA: Diagnosis not present

## 2014-03-09 DIAGNOSIS — N35919 Unspecified urethral stricture, male, unspecified site: Secondary | ICD-10-CM | POA: Insufficient documentation

## 2014-03-09 DIAGNOSIS — E669 Obesity, unspecified: Secondary | ICD-10-CM | POA: Insufficient documentation

## 2014-03-09 DIAGNOSIS — I10 Essential (primary) hypertension: Secondary | ICD-10-CM | POA: Insufficient documentation

## 2014-03-09 DIAGNOSIS — J45909 Unspecified asthma, uncomplicated: Secondary | ICD-10-CM | POA: Insufficient documentation

## 2014-03-09 DIAGNOSIS — E119 Type 2 diabetes mellitus without complications: Secondary | ICD-10-CM | POA: Insufficient documentation

## 2014-03-09 DIAGNOSIS — IMO0001 Reserved for inherently not codable concepts without codable children: Secondary | ICD-10-CM | POA: Insufficient documentation

## 2014-03-09 DIAGNOSIS — F3289 Other specified depressive episodes: Secondary | ICD-10-CM | POA: Insufficient documentation

## 2014-03-09 DIAGNOSIS — E7800 Pure hypercholesterolemia, unspecified: Secondary | ICD-10-CM | POA: Insufficient documentation

## 2014-03-09 DIAGNOSIS — F329 Major depressive disorder, single episode, unspecified: Secondary | ICD-10-CM | POA: Insufficient documentation

## 2014-03-09 HISTORY — DX: Personal history of other diseases of the digestive system: Z87.19

## 2014-03-09 HISTORY — DX: Type 2 diabetes mellitus without complications: E11.9

## 2014-03-09 HISTORY — DX: Gastro-esophageal reflux disease without esophagitis: K21.9

## 2014-03-09 HISTORY — DX: Stress incontinence (female) (male): N39.3

## 2014-03-09 HISTORY — DX: Complete loss of teeth, unspecified cause, unspecified class: Z97.2

## 2014-03-09 HISTORY — DX: Personal history of other diseases of the musculoskeletal system and connective tissue: Z87.39

## 2014-03-09 HISTORY — DX: Disorder of kidney and ureter, unspecified: N28.9

## 2014-03-09 HISTORY — PX: BLADDER SUSPENSION: SHX72

## 2014-03-09 HISTORY — DX: Unspecified asthma, uncomplicated: J45.909

## 2014-03-09 HISTORY — DX: Presence of insulin pump (external) (internal): Z96.41

## 2014-03-09 HISTORY — DX: Type 2 diabetes mellitus without complications: Z79.4

## 2014-03-09 HISTORY — DX: Complete loss of teeth, unspecified cause, unspecified class: K08.109

## 2014-03-09 HISTORY — DX: Other specified postprocedural states: Z98.890

## 2014-03-09 HISTORY — DX: Presence of spectacles and contact lenses: Z97.3

## 2014-03-09 LAB — POCT I-STAT, CHEM 8
BUN: 13 mg/dL (ref 6–23)
CHLORIDE: 103 meq/L (ref 96–112)
Calcium, Ion: 1.24 mmol/L (ref 1.13–1.30)
Creatinine, Ser: 1.3 mg/dL — ABNORMAL HIGH (ref 0.50–1.10)
Glucose, Bld: 145 mg/dL — ABNORMAL HIGH (ref 70–99)
HCT: 47 % — ABNORMAL HIGH (ref 36.0–46.0)
HEMOGLOBIN: 16 g/dL — AB (ref 12.0–15.0)
Potassium: 4.1 mEq/L (ref 3.7–5.3)
SODIUM: 140 meq/L (ref 137–147)
TCO2: 25 mmol/L (ref 0–100)

## 2014-03-09 LAB — ABO/RH: ABO/RH(D): A POS

## 2014-03-09 LAB — GLUCOSE, CAPILLARY: Glucose-Capillary: 116 mg/dL — ABNORMAL HIGH (ref 70–99)

## 2014-03-09 LAB — TYPE AND SCREEN
ABO/RH(D): A POS
ANTIBODY SCREEN: NEGATIVE

## 2014-03-09 SURGERY — CREATION, URETHRAL SLING, RETROPUBIC APPROACH, USING POLYPROPYLENE TAPE
Anesthesia: General | Site: Bladder

## 2014-03-09 MED ORDER — MIDAZOLAM HCL 2 MG/2ML IJ SOLN
INTRAMUSCULAR | Status: AC
  Filled 2014-03-09: qty 2

## 2014-03-09 MED ORDER — GENTAMICIN IN SALINE 1.6-0.9 MG/ML-% IV SOLN
80.0000 mg | INTRAVENOUS | Status: DC
  Filled 2014-03-09: qty 50

## 2014-03-09 MED ORDER — LACTATED RINGERS IV SOLN
INTRAVENOUS | Status: DC
  Administered 2014-03-09: 11:00:00 via INTRAVENOUS
  Filled 2014-03-09: qty 1000

## 2014-03-09 MED ORDER — HYDROMORPHONE HCL PF 1 MG/ML IJ SOLN
0.2500 mg | INTRAMUSCULAR | Status: DC | PRN
  Filled 2014-03-09: qty 1

## 2014-03-09 MED ORDER — FENTANYL CITRATE 0.05 MG/ML IJ SOLN
INTRAMUSCULAR | Status: AC
  Filled 2014-03-09: qty 4

## 2014-03-09 MED ORDER — HYDROCODONE-ACETAMINOPHEN 5-325 MG PO TABS: 1.0000 | ORAL_TABLET | Freq: Four times a day (QID) | ORAL | Status: DC | PRN

## 2014-03-09 MED ORDER — LACTATED RINGERS IV SOLN
INTRAVENOUS | Status: DC | PRN
  Administered 2014-03-09 (×2): via INTRAVENOUS

## 2014-03-09 MED ORDER — CHLORHEXIDINE GLUCONATE 4 % EX LIQD
1.0000 "application " | Freq: Once | CUTANEOUS | Status: DC
  Filled 2014-03-09: qty 15

## 2014-03-09 MED ORDER — PHENAZOPYRIDINE HCL 200 MG PO TABS
200.0000 mg | ORAL_TABLET | Freq: Once | ORAL | Status: AC
  Administered 2014-03-09: 200 mg via ORAL
  Filled 2014-03-09: qty 1

## 2014-03-09 MED ORDER — MIDAZOLAM HCL 5 MG/5ML IJ SOLN
INTRAMUSCULAR | Status: DC | PRN
  Administered 2014-03-09 (×2): 1 mg via INTRAVENOUS

## 2014-03-09 MED ORDER — SODIUM CHLORIDE 0.9 % IV SOLN
1.5000 g | INTRAVENOUS | Status: AC
  Administered 2014-03-09: 1.5 g via INTRAVENOUS
  Filled 2014-03-09: qty 1.5

## 2014-03-09 MED ORDER — STERILE WATER FOR IRRIGATION IR SOLN
Status: DC | PRN
  Administered 2014-03-09: 3000 mL

## 2014-03-09 MED ORDER — OXYCODONE HCL 5 MG/5ML PO SOLN
5.0000 mg | Freq: Once | ORAL | Status: DC | PRN
  Filled 2014-03-09: qty 5

## 2014-03-09 MED ORDER — GLYCOPYRROLATE 0.2 MG/ML IJ SOLN
INTRAMUSCULAR | Status: DC | PRN
  Administered 2014-03-09: 0.4 mg via INTRAVENOUS

## 2014-03-09 MED ORDER — SODIUM CHLORIDE 0.9 % IR SOLN
Status: DC | PRN
  Administered 2014-03-09: 11:00:00

## 2014-03-09 MED ORDER — GENTAMICIN SULFATE 40 MG/ML IJ SOLN
340.0000 mg | INTRAVENOUS | Status: DC
  Filled 2014-03-09: qty 8.5

## 2014-03-09 MED ORDER — PHENAZOPYRIDINE HCL 100 MG PO TABS
ORAL_TABLET | ORAL | Status: AC
  Filled 2014-03-09: qty 2

## 2014-03-09 MED ORDER — PROPOFOL 10 MG/ML IV BOLUS
INTRAVENOUS | Status: DC | PRN
  Administered 2014-03-09: 200 mg via INTRAVENOUS

## 2014-03-09 MED ORDER — PROMETHAZINE HCL 25 MG/ML IJ SOLN
6.2500 mg | INTRAMUSCULAR | Status: DC | PRN
  Filled 2014-03-09: qty 1

## 2014-03-09 MED ORDER — OXYCODONE HCL 5 MG PO TABS
5.0000 mg | ORAL_TABLET | Freq: Once | ORAL | Status: DC | PRN
  Filled 2014-03-09: qty 1

## 2014-03-09 MED ORDER — MEPERIDINE HCL 25 MG/ML IJ SOLN
6.2500 mg | INTRAMUSCULAR | Status: DC | PRN
  Filled 2014-03-09: qty 1

## 2014-03-09 MED ORDER — ONDANSETRON HCL 4 MG/2ML IJ SOLN
INTRAMUSCULAR | Status: DC | PRN
  Administered 2014-03-09: 4 mg via INTRAVENOUS

## 2014-03-09 MED ORDER — LIDOCAINE-EPINEPHRINE (PF) 1 %-1:200000 IJ SOLN
INTRAMUSCULAR | Status: DC | PRN
  Administered 2014-03-09: 2 mL

## 2014-03-09 MED ORDER — ACETAMINOPHEN 10 MG/ML IV SOLN
INTRAVENOUS | Status: DC | PRN
  Administered 2014-03-09: 1000 mg via INTRAVENOUS

## 2014-03-09 MED ORDER — CEFAZOLIN SODIUM-DEXTROSE 2-3 GM-% IV SOLR
2.0000 g | INTRAVENOUS | Status: AC
Start: 2014-03-09 — End: 2014-03-09
  Administered 2014-03-09: 2 g via INTRAVENOUS
  Filled 2014-03-09: qty 50

## 2014-03-09 MED ORDER — NEOSTIGMINE METHYLSULFATE 10 MG/10ML IV SOLN
INTRAVENOUS | Status: DC | PRN
  Administered 2014-03-09: 3 mg via INTRAVENOUS

## 2014-03-09 MED ORDER — AMPICILLIN-SULBACTAM SODIUM 1.5 (1-0.5) G IJ SOLR
INTRAMUSCULAR | Status: AC
  Filled 2014-03-09: qty 1.5

## 2014-03-09 MED ORDER — FENTANYL CITRATE 0.05 MG/ML IJ SOLN
INTRAMUSCULAR | Status: DC | PRN
  Administered 2014-03-09: 50 ug via INTRAVENOUS
  Administered 2014-03-09: 25 ug via INTRAVENOUS
  Administered 2014-03-09: 50 ug via INTRAVENOUS

## 2014-03-09 MED ORDER — ROCURONIUM BROMIDE 100 MG/10ML IV SOLN
INTRAVENOUS | Status: DC | PRN
  Administered 2014-03-09: 30 mg via INTRAVENOUS

## 2014-03-09 MED ORDER — KETOROLAC TROMETHAMINE 30 MG/ML IJ SOLN
INTRAMUSCULAR | Status: DC | PRN
  Administered 2014-03-09: 30 mg via INTRAVENOUS

## 2014-03-09 MED ORDER — LIDOCAINE HCL (CARDIAC) 20 MG/ML IV SOLN
INTRAVENOUS | Status: DC | PRN
  Administered 2014-03-09: 100 mg via INTRAVENOUS

## 2014-03-09 SURGICAL SUPPLY — 62 items
ADH SKN CLS APL DERMABOND .7 (GAUZE/BANDAGES/DRESSINGS) ×1
BAG URINE DRAINAGE (UROLOGICAL SUPPLIES) IMPLANT
BLADE HEX COATED 2.75 (ELECTRODE) ×3 IMPLANT
BLADE SURG 10 STRL SS (BLADE) ×3 IMPLANT
BLADE SURG 15 STRL LF DISP TIS (BLADE) ×1 IMPLANT
BLADE SURG 15 STRL SS (BLADE) ×3
BLADE SURG ROTATE 9660 (MISCELLANEOUS) ×3 IMPLANT
CANISTER SUCT LVC 12 LTR MEDI- (MISCELLANEOUS) IMPLANT
CANISTER SUCTION 1200CC (MISCELLANEOUS) ×3 IMPLANT
CATH FOLEY 2WAY SLVR  5CC 16FR (CATHETERS) ×2
CATH FOLEY 2WAY SLVR 5CC 16FR (CATHETERS) ×1 IMPLANT
CLOTH BEACON ORANGE TIMEOUT ST (SAFETY) ×3 IMPLANT
COVER MAYO STAND STRL (DRAPES) ×6 IMPLANT
COVER TABLE BACK 60X90 (DRAPES) ×3 IMPLANT
DERMABOND ADVANCED (GAUZE/BANDAGES/DRESSINGS) ×2
DERMABOND ADVANCED .7 DNX12 (GAUZE/BANDAGES/DRESSINGS) ×1 IMPLANT
DRAIN PENROSE 18X1/4 LTX STRL (WOUND CARE) IMPLANT
DRAPE CAMERA CLOSED 9X96 (DRAPES) ×3 IMPLANT
DRAPE UNDERBUTTOCKS STRL (DRAPE) ×3 IMPLANT
DRESSING TELFA 8X3 (GAUZE/BANDAGES/DRESSINGS) IMPLANT
ELECT REM PT RETURN 9FT ADLT (ELECTROSURGICAL) ×3
ELECTRODE REM PT RTRN 9FT ADLT (ELECTROSURGICAL) ×1 IMPLANT
GAUZE SPONGE 4X4 12PLY STRL LF (GAUZE/BANDAGES/DRESSINGS) IMPLANT
GAUZE SPONGE 4X4 16PLY XRAY LF (GAUZE/BANDAGES/DRESSINGS) IMPLANT
GLOVE BIO SURGEON STRL SZ7.5 (GLOVE) ×6 IMPLANT
GLOVE BIOGEL M STER SZ 6 (GLOVE) ×2 IMPLANT
GLOVE BIOGEL PI IND STRL 6.5 (GLOVE) IMPLANT
GLOVE BIOGEL PI INDICATOR 6.5 (GLOVE) ×2
GOWN STRL NON-REIN LRG LVL3 (GOWN DISPOSABLE) ×2 IMPLANT
GOWN STRL REIN XL XLG (GOWN DISPOSABLE) ×1 IMPLANT
GOWN STRL REUS W/TWL LRG LVL3 (GOWN DISPOSABLE) ×2 IMPLANT
GOWN STRL REUS W/TWL XL LVL3 (GOWN DISPOSABLE) ×4 IMPLANT
HOLDER FOLEY CATH W/STRAP (MISCELLANEOUS) ×3 IMPLANT
HOOK RETRACTION 12 ELAST STAY (MISCELLANEOUS) IMPLANT
NEEDLE HYPO 22GX1.5 SAFETY (NEEDLE) ×3 IMPLANT
NS IRRIG 500ML POUR BTL (IV SOLUTION) IMPLANT
PACK BASIN DAY SURGERY FS (CUSTOM PROCEDURE TRAY) ×3 IMPLANT
PACKING VAGINAL (PACKING) ×3 IMPLANT
PENCIL BUTTON HOLSTER BLD 10FT (ELECTRODE) ×3 IMPLANT
PLUG CATH AND CAP STER (CATHETERS) ×3 IMPLANT
RETRACTOR STERILE 25.8CMX11.3 (INSTRUMENTS) IMPLANT
SET IRRIG Y TYPE TUR BLADDER L (SET/KITS/TRAYS/PACK) ×3 IMPLANT
SHEET LAVH (DRAPES) ×3 IMPLANT
SLING SYSTEM SPARC (Sling) ×2 IMPLANT
SURGILUBE 2OZ TUBE FLIPTOP (MISCELLANEOUS) ×3 IMPLANT
SUT SILK 2 0 30  PSL (SUTURE)
SUT SILK 2 0 30 PSL (SUTURE) IMPLANT
SUT VIC AB 2-0 CT1 27 (SUTURE) ×6
SUT VIC AB 2-0 CT1 TAPERPNT 27 (SUTURE) ×2 IMPLANT
SUT VIC AB 2-0 SH 27 (SUTURE) ×3
SUT VIC AB 2-0 SH 27XBRD (SUTURE) IMPLANT
SUT VICRYL 4-0 PS2 18IN ABS (SUTURE) ×3 IMPLANT
SYR BULB IRRIGATION 50ML (SYRINGE) ×3 IMPLANT
SYR CONTROL 10ML LL (SYRINGE) ×3 IMPLANT
SYRINGE 10CC LL (SYRINGE) ×3 IMPLANT
TOWEL OR 17X24 6PK STRL BLUE (TOWEL DISPOSABLE) ×3 IMPLANT
TRAY DSU PREP LF (CUSTOM PROCEDURE TRAY) ×3 IMPLANT
TUBE CONNECTING 12'X1/4 (SUCTIONS) ×2
TUBE CONNECTING 12X1/4 (SUCTIONS) ×4 IMPLANT
WATER STERILE IRR 3000ML UROMA (IV SOLUTION) ×3 IMPLANT
WATER STERILE IRR 500ML POUR (IV SOLUTION) IMPLANT
YANKAUER SUCT BULB TIP NO VENT (SUCTIONS) ×3 IMPLANT

## 2014-03-09 NOTE — Interval H&P Note (Signed)
History and Physical Interval Note:  03/09/2014 7:18 AM  [name]  has presented today for surgery, with the diagnosis of STRESS INCONTINENCE  The various methods of treatment have been discussed with the patient and family. After consideration of risks, benefits and other options for treatment, the patient has consented to  Procedure(s): CYSTOSCOPY/SLING (N/A) as a surgical intervention .  The patient's history has been reviewed, patient examined, no change in status, stable for surgery.  I have reviewed the patient's chart and labs.  Questions were answered to the patient's satisfaction.     Marisabel Macpherson A

## 2014-03-09 NOTE — Anesthesia Postprocedure Evaluation (Signed)
Anesthesia Post Note  Patient: [name]  Procedure(s) Performed: Procedure(s) (LRB): CYSTOSCOPY/SLING (N/A)  Anesthesia type: General  Patient location: PACU  Post pain: Pain level controlled  Post assessment: Post-op Vital signs reviewed  Last Vitals: BP 162/81  Pulse 67  Temp(Src) 35.9 C (Oral)  Resp 16  Ht 5\' 4"  (1.626 m)  Wt 189 lb (85.73 kg)  BMI 32.43 kg/m2  SpO2 100%  Post vital signs: Reviewed  Level of consciousness: sedated  Complications: No apparent anesthesia complications

## 2014-03-09 NOTE — Op Note (Signed)
Preoperative diagnosis: Stress urinary incontinence Postoperative diagnosis: Stress urinary incontinence and meatal stenosis Procedure: Sling cystourethropexy [facility]) and cystoscopy and urethral dilation Surgeon: Dr. Nicki Reaper Laureano Hetzer Assistant: Dr Glo Herring  None  Estimated blood loss: < 50 milliliters  The patient was prepped and draped in the usual fashion. Extra care was taken with leg positioning to minimize the  risk of compartment syndrome, neuropathy, and deep vein thrombosis. Preoperative laboratory tests were normal and preoperative antibiotics were given. Her insulin pump was prepped and initially with a sterile OpSite dressing  At the beginning of the case visually and utilizing a catheter she had mild meatal stenosis to approximately 14 [place]. I gently dilated from 14-24 [place]. There is no injury or trauma  Two less than 1 cm incisions were made 1 fingerbreadth above the symphysis pubis 1.5 cm lateral to the midline. A 2 cm appropriate depth suburethral incision was made underneath the mid urethra after instilling approximately 5 cc of 1% lidocaine mixture. I sharply and bluntly dissected to the urethral vesical angle bilaterally. Under anesthesia she had a small cystocele that I push back with a small 4 x 4 that was moist. I spent a lot of time marking her mid urethra since the urethra was rather fixed in place and tissues were under tension when I placed the vaginal speculum. She also looked like she may have some atrophy associated as well. I was happy with the depth of the incision.  With the bladder empty I passed a SPARC needle on top of and along the back of the symphysis pubis staying  on the periosteum and staying lateral using my box technique and delivering the needle onto the pulp of my  index finger bilaterally.  I cystoscoped the patient thoroughly and there was no injury to the bladder or urethra. There was no movement  or indentation of the bladder with movement  of the trocar. She had an excellent yellow jet on the right side. I felt I could see clear urine on the left with opening of the ureteral orifice. Again I double checked the position of my trocar and certainly was a well away from the ureter  With the bladder emptied I attached the [facility] sling and brought it up through the retropubic space bilaterally. I tensioned it over the fat part of a moderate size Kelly clamp. I cut below the blue dots, irrigated the sheaths, and removed the sheaths. I was very happy with the position and tension of the sling. There was appropriate hypermobility and no springback effect. If anything it was 1 or 2 mm on the looser side but within normal limits.  All incisions were irrigated. The sling was cut below the skin level. I closed the anterior vaginal wall with  running 2-0 Vicryl followed by my interrupted sutures. Interrupted 4-0 Vicryl was used for the abdominal incisions.  A catheter plug was used with the Foley catheter as well as a vaginal pack.  The patient was taken to the recovery room and hopefully this procedure will reach her treatment goal.   Blood loss less than 50 mL

## 2014-03-09 NOTE — Discharge Instructions (Signed)
I have reviewed discharge instructions in detail with the patient. They will follow-up with me or their physician as scheduled. My nurse will also be calling the patients as per protocol.    HOME CARE INSTRUCTIONS FOR VAGINAL SLING  Activity:  -No lifting greater than 10-15 pounds for  *** weeks, or as instructed by your                        physician.  -No driving a car for one week.  -No sexual intercourse for  *** weeks.  Diet:  You may return to your normal diet tomorrow.   It is important to keep your       bowels regular during the postoperative period.  To avoid constipation, drink plenty of fluids during the day (8-10 glasses) and eat plenty of fresh fruits and vegetables.  Use a mild laxative or stool softener if necessary.  Wound Care:  You may begin showering in  *** days, or as instructed by your physician.      Return to Work as instructed by your physician.    Special Instructions:   Call your physician if any of these symptoms occur:   -temperature greater than 101 degrees Farenheit.   -redness, swelling or drainage at incision site.   -foul odor of your urine.   -a significant decrease in the amount of urine you have every day.   -severe pain not relieved by your pain medication.    Return to see your doctor in *** weeks. Call to set up a follow-up appointment.  Patient Signature:  ________________________________________________________  Nurse's Signature:  ________________________________________________________   Post Anesthesia Home Care Instructions  Activity: Get plenty of rest for the remainder of the day. A responsible adult should stay with you for 24 hours following the procedure.  For the next 24 hours, DO NOT: -Drive a car -Paediatric nurse -Drink alcoholic beverages -Take any medication unless instructed by your physician -Make any legal decisions or sign important papers.  Meals: Start with liquid foods such as gelatin or soup. Progress  to regular foods as tolerated. Avoid greasy, spicy, heavy foods. If nausea and/or vomiting occur, drink only clear liquids until the nausea and/or vomiting subsides. Call your physician if vomiting continues.  Special Instructions/Symptoms: Your throat may feel dry or sore from the anesthesia or the breathing tube placed in your throat during surgery. If this causes discomfort, gargle with warm salt water. The discomfort should disappear within 24 hours.

## 2014-03-09 NOTE — Anesthesia Preprocedure Evaluation (Signed)
Anesthesia Evaluation  Patient identified by MRN, date of birth, ID band Patient awake    Reviewed: Allergy & Precautions, H&P , NPO status , Patient's Chart, lab work & pertinent test results  Airway Mallampati: II TM Distance: >3 FB Neck ROM: Full    Dental  (+) Dental Advisory Given   Pulmonary asthma ,  breath sounds clear to auscultation        Cardiovascular hypertension, Pt. on medications + Peripheral Vascular Disease Rhythm:Regular Rate:Normal     Neuro/Psych PSYCHIATRIC DISORDERS Depression negative neurological ROS  negative psych ROS   GI/Hepatic Neg liver ROS, hiatal hernia, GERD-  Medicated,  Endo/Other  diabetes, Type 2, Oral Hypoglycemic Agents, Insulin Dependent  Renal/GU Renal InsufficiencyRenal disease     Musculoskeletal  (+) Fibromyalgia -, narcotic dependent  Abdominal (+) + obese,   Peds  Hematology negative hematology ROS (+)   Anesthesia Other Findings   Reproductive/Obstetrics negative OB ROS                           Anesthesia Physical Anesthesia Plan  ASA: III  Anesthesia Plan: General   Post-op Pain Management:    Induction: Intravenous  Airway Management Planned: Oral ETT  Additional Equipment:   Intra-op Plan:   Post-operative Plan: Extubation in OR  Informed Consent: I have reviewed the patients History and Physical, chart, labs and discussed the procedure including the risks, benefits and alternatives for the proposed anesthesia with the patient or authorized representative who has indicated his/her understanding and acceptance.   Dental advisory given  Plan Discussed with: CRNA  Anesthesia Plan Comments:         Anesthesia Quick Evaluation

## 2014-03-09 NOTE — Transfer of Care (Signed)
Immediate Anesthesia Transfer of Care Note  Patient: [name]  Procedure(s) Performed: Procedure(s) (LRB): CYSTOSCOPY/SLING (N/A)  Patient Location: PACU  Anesthesia Type: General  Level of Consciousness: awake, oriented, sedated and patient cooperative  Airway & Oxygen Therapy: Patient Spontanous Breathing and Patient connected to face mask oxygen  Post-op Assessment: Report given to PACU RN and Post -op Vital signs reviewed and stable  Post vital signs: Reviewed and stable  Complications: No apparent anesthesia complications

## 2014-03-09 NOTE — Anesthesia Procedure Notes (Signed)
Procedure Name: Intubation Date/Time: 03/09/2014 11:25 AM Performed by: Justice Rocher Pre-anesthesia Checklist: Patient identified, Emergency Drugs available, Suction available and Patient being monitored Patient Re-evaluated:Patient Re-evaluated prior to inductionOxygen Delivery Method: Circle System Utilized Preoxygenation: Pre-oxygenation with 100% oxygen Intubation Type: IV induction Ventilation: Mask ventilation without difficulty Laryngoscope Size: Mac and 3 Grade View: Grade II Tube type: Oral Tube size: 7.0 mm Number of attempts: 1 Airway Equipment and Method: stylet and oral airway Placement Confirmation: ETT inserted through vocal cords under direct vision,  positive ETCO2 and breath sounds checked- equal and bilateral Tube secured with: Tape Dental Injury: Teeth and Oropharynx as per pre-operative assessment

## 2014-03-09 NOTE — OR Nursing (Signed)
Gentamycin discontinued by verbal order by Dr. Suzanne Boron Diarmid at 1137 into OR#1.

## 2014-03-10 ENCOUNTER — Encounter (HOSPITAL_BASED_OUTPATIENT_CLINIC_OR_DEPARTMENT_OTHER): Payer: Self-pay | Admitting: Urology

## 2014-03-18 ENCOUNTER — Other Ambulatory Visit (INDEPENDENT_AMBULATORY_CARE_PROVIDER_SITE_OTHER): Payer: PRIVATE HEALTH INSURANCE

## 2014-03-18 DIAGNOSIS — IMO0001 Reserved for inherently not codable concepts without codable children: Secondary | ICD-10-CM

## 2014-03-18 DIAGNOSIS — E1165 Type 2 diabetes mellitus with hyperglycemia: Principal | ICD-10-CM

## 2014-03-18 LAB — COMPREHENSIVE METABOLIC PANEL
ALT: 14 U/L (ref 0–35)
AST: 21 U/L (ref 0–37)
Albumin: 3.6 g/dL (ref 3.5–5.2)
Alkaline Phosphatase: 76 U/L (ref 39–117)
BUN: 18 mg/dL (ref 6–23)
CALCIUM: 9.2 mg/dL (ref 8.4–10.5)
CO2: 28 meq/L (ref 19–32)
CREATININE: 1.4 mg/dL — AB (ref 0.4–1.2)
Chloride: 102 mEq/L (ref 96–112)
GFR: 50.72 mL/min — AB (ref >60.00)
Glucose, Bld: 150 mg/dL — ABNORMAL HIGH (ref 70–99)
Potassium: 3.6 mEq/L (ref 3.5–5.1)
Sodium: 137 mEq/L (ref 135–145)
Total Bilirubin: 0.4 mg/dL (ref 0.2–1.2)
Total Protein: 7.2 g/dL (ref 6.0–8.3)

## 2014-03-22 ENCOUNTER — Ambulatory Visit: Payer: PRIVATE HEALTH INSURANCE | Admitting: Endocrinology

## 2014-03-22 LAB — FRUCTOSAMINE: Fructosamine: 284 umol/L — ABNORMAL HIGH (ref 190–270)

## 2014-04-14 ENCOUNTER — Other Ambulatory Visit: Payer: Self-pay | Admitting: *Deleted

## 2014-04-14 ENCOUNTER — Telehealth: Payer: Self-pay | Admitting: Endocrinology

## 2014-04-14 MED ORDER — GLUCOSE BLOOD VI STRP: ORAL_STRIP | Status: DC

## 2014-04-14 NOTE — Telephone Encounter (Signed)
rx has been sent 

## 2014-04-14 NOTE — Telephone Encounter (Signed)
Roselyn Reef from [employer] stated that patient need refill on accu test strips, and stated that she sent it in a couple of time and not heard anything back.

## 2014-04-16 ENCOUNTER — Telehealth: Payer: Self-pay | Admitting: Endocrinology

## 2014-04-16 NOTE — Telephone Encounter (Signed)
Patient stated that this is another phone number you can call her at.  Cell# [phone]

## 2014-04-16 NOTE — Telephone Encounter (Signed)
Patient need refill on test strips, [facility] stated that they have sent a few faxes over and have not received anything yet. Please advise

## 2014-05-07 ENCOUNTER — Other Ambulatory Visit: Payer: Self-pay | Admitting: *Deleted

## 2014-05-07 MED ORDER — INSULIN ASPART 100 UNIT/ML ~~LOC~~ SOLN: SUBCUTANEOUS | Status: DC

## 2014-05-07 NOTE — Telephone Encounter (Signed)
rx sent

## 2014-05-07 NOTE — Telephone Encounter (Signed)
Patient need refill of Novalog 100 units called into [place].

## 2014-05-20 ENCOUNTER — Ambulatory Visit (INDEPENDENT_AMBULATORY_CARE_PROVIDER_SITE_OTHER): Payer: PRIVATE HEALTH INSURANCE | Admitting: Endocrinology

## 2014-05-20 ENCOUNTER — Encounter: Payer: Self-pay | Admitting: Endocrinology

## 2014-05-20 VITALS — BP 144/84 | HR 89 | Temp 98.8°F | Ht 64.0 in | Wt 195.0 lb

## 2014-05-20 DIAGNOSIS — E1165 Type 2 diabetes mellitus with hyperglycemia: Principal | ICD-10-CM

## 2014-05-20 DIAGNOSIS — IMO0001 Reserved for inherently not codable concepts without codable children: Secondary | ICD-10-CM

## 2014-05-20 DIAGNOSIS — N182 Chronic kidney disease, stage 2 (mild): Secondary | ICD-10-CM

## 2014-05-20 DIAGNOSIS — I10 Essential (primary) hypertension: Secondary | ICD-10-CM

## 2014-05-20 MED ORDER — SPIRONOLACTONE 50 MG PO TABS: 50.0000 mg | ORAL_TABLET | Freq: Every day | ORAL | Status: DC

## 2014-05-20 NOTE — Progress Notes (Signed)
Patient ID: [name], female   DOB: [birth year], 63 y.o.   MRN: [id number]   Reason for Appointment: Diabetes follow-up   History of Present Illness   Diagnosis: Type 2, date of diagnosis:1985.    PAST history: She has had difficult to control diabetes requiring large doses of insulin especially at meal times. Because of poor control she was switched from NovoLog to U-500 insulin in 11/2012. At that time her A1c was 9%  Initially seemed to have better blood sugars. With no clear response with Invokana this was stopped and she was started on Actos. Her insulin was adjusted with higher mealtime coverage at breakfast and lunch and lower amounts at supper. Lantus requirement appeared to be more in the morning and evening dose was eventually stopped. She  has been given Victoza and Invokana later in an attempt to improve her sugars but with only limited success. Because of continued poor control with subcutaneous insulin and large insulin requirement she was switched to a [employer] insulin pump which she started on 07/29/13 Prior to starting insulin pump her blood sugar at home was averaging 273 With the U 500 insulin in the pump her blood sugars were dramatically better with rare hypoglycemia   RECENT history:   She was switched to NovoLog insulin in her pump instead of U-500 insulin in 3/15 because of an episode of severe overnight hypoglycemia She has not followed up since her visit in 5/15 since she was out of town No recent A1c is available but she is still having significant high fasting readings Not clear why she was given additional Levemir insulin at bedtime by the hospital physician on discharge; this is not helping Previously had been afraid of hypoglycemia but is not having any low sugars recently She is overall compliant with her glucose monitoring and boluses although does have a couple of days without any boluses Has gained weight because of lack of exercise and probably  inconsistent diet when away from home Current blood sugar patterns:  Her sugars are significantly high in the mornings no readings below 140  Blood sugars are continuing to be high until about 4-5 PM  Blood sugars may be low normal around 8 PM and later  Postprandial readings appear to be relatively lower after her evening meal  Only occasionally has over correction with doing boluses for high readings  She has checked occasional readings over night and these are variable  PREMEAL Breakfast Lunch Dinner Bedtime Overall  Glucose range:  151-220   126-268   93-212   90-214    Mean/median:  202      166+/-59    Pump settings  Basal rate midnight = 0.7, 3 AM = 0.7, 7 AM = 1.5, 11 AM = 3.5, 5 PM = 2.5 and 9 PM = 1.6. Carbohydrate coverage 1:5, correction 1: 25 and target 110-120, insulin duration 4 hours  Bolus events: 1.5 per day, 81% with correction BASAL total amount = 47 units, total insulin 60 units +/-8.4, bolus percent use 22 %  Meals: 3 meals per day, at 11 am, 3 pm and 7 pm Carbohydrate intake: 64 g daily on average.  Food preferences: She has oatmeal or cereal and juice,  toast  occasionally with egg, usually has a sandwich and salad for lunch and vegetables for dinner with fish or lean meat for protein. Will snack on apples, raisons or wheat thins.   Wt Readings from Last 3 Encounters:  05/20/14 195 lb (88.451 kg)  03/09/14 189 lb (85.73 kg)  03/09/14 189 lb (85.73 kg)   Physical activity: exercise: Only for 2 weeks Dietician visit: Most recent: 2012  Retinal exam: Most recent: 2010.   Complications: No retinopathy or known nephropathy.    Lab Results  Component Value Date   HGBA1C 7.9* 01/12/2014   HGBA1C 7.2* 10/09/2013   HGBA1C 10.2* 08/03/2013   Lab Results  Component Value Date   MICROALBUR 3.0* 03/16/2013   LDLCALC 99 11/13/2013   CREATININE 1.4* 03/18/2014       Medication List       This list is accurate as of: 05/20/14  4:34 PM.  Always use your most  recent med list.               ADVAIR DISKUS 250-50 MCG/DOSE Aepb  Generic drug:  Fluticasone-Salmeterol  Inhale 1 puff into the lungs 2 (two) times daily as needed.     albuterol 108 (90 BASE) MCG/ACT inhaler  Commonly known as:  PROVENTIL HFA;VENTOLIN HFA  Inhale 2 puffs into the lungs every 6 (six) hours as needed for wheezing or shortness of breath. For shortness of breath     amLODipine 10 MG tablet  Commonly known as:  NORVASC  Take 10 mg by mouth every evening.     cetirizine 10 MG tablet  Commonly known as:  ZYRTEC  Take 10 mg by mouth daily as needed for allergies.     cyclobenzaprine 10 MG tablet  Commonly known as:  FLEXERIL  Take 10 mg by mouth 3 (three) times daily as needed for muscle spasms.     docusate sodium 100 MG capsule  Commonly known as:  COLACE  Take 100 mg by mouth 2 (two) times daily as needed for mild constipation.     EPIPEN 2-PAK 0.3 mg/0.3 mL Soaj injection  Generic drug:  EPINEPHrine  Inject 0.3 mg into the muscle once.     gabapentin 300 MG capsule  Commonly known as:  NEURONTIN  Take 300 mg by mouth 2 (two) times daily as needed (for pain). If needed can use up to tid     glucagon 1 MG injection  Commonly known as:  GLUCAGON EMERGENCY  Inject 1 mg into the vein once as needed.     glucose blood test strip  Commonly known as:  BAYER CONTOUR NEXT TEST  Use as instructed to check blood sugar 5 times per day dx code 250.02     HYDROcodone-acetaminophen 5-325 MG per tablet  Commonly known as:  NORCO  Take 1 tablet by mouth every 6 (six) hours as needed for moderate pain.     hydrOXYzine 25 MG tablet  Commonly known as:  ATARAX/VISTARIL  Take 50 mg by mouth 2 (two) times daily as needed for itching. For itching     insulin aspart 100 UNIT/ML injection  Commonly known as:  novoLOG  Use max 60 units per day with insulin pump     LEVEMIR FLEXTOUCH 100 UNIT/ML Pen  Generic drug:  Insulin Detemir  Inject 15 Units into the skin daily at  10 pm.     losartan-hydrochlorothiazide 100-25 MG per tablet  Commonly known as:  HYZAAR  Take 1 tablet by mouth every morning.     multivitamin with minerals Tabs tablet  Take 1 tablet by mouth daily.     oxyCODONE-acetaminophen 5-325 MG per tablet  Commonly known as:  ROXICET  Take 1 tablet by mouth every 6 (six) hours as needed.  pantoprazole 40 MG tablet  Commonly known as:  PROTONIX  Take 40 mg by mouth every evening.     potassium chloride SA 20 MEQ tablet  Commonly known as:  K-DUR,KLOR-CON  Take 20 mEq by mouth every morning.     pravastatin 40 MG tablet  Commonly known as:  PRAVACHOL  Take 40 mg by mouth every morning.     PRESCRIPTION MEDICATION  Inject as directed every other day. ALLERGY INJECTIONS  EVERY OTHER DAY     solifenacin 5 MG tablet  Commonly known as:  VESICARE  Take 5 mg by mouth every morning.     spironolactone 25 MG tablet  Commonly known as:  ALDACTONE  Take 25 mg by mouth every morning.     trimethoprim 100 MG tablet  Commonly known as:  TRIMPEX  Take 100 mg by mouth daily.     VICTOZA 18 MG/3ML Soln injection  Generic drug:  Liraglutide  Inject 1.2 mg into the skin every morning.        Allergies:  Allergies  Allergen Reactions  . Codeine Itching and Nausea And Vomiting    Past Medical History  Diagnosis Date  . Hypertension   . Arthritis   . Fibromyalgia   . Hyperlipidemia   . SUI (stress urinary incontinence, female)   . Diabetes mellitus type 2, insulin dependent   . Insulin pump in place     since 12/ 2014--  [employer]  . GERD (gastroesophageal reflux disease)   . H/O hiatal hernia   . History of esophageal dilatation   . Renal insufficiency   . History of rhabdomyolysis     03/ 2015  . Asthma   . Wears glasses   . Full dentures     Past Surgical History  Procedure Laterality Date  . Carpal tunnel release Right 2000  . Knee arthroscopy Right 1989  &  2002  . Excision left upper arm mass  01-29-2014  .  Cardiac catheterization  07-20-2009   DR Valdez  NORMAL  LVEF/  NORMAL CORONARY AND RENAL ARTERIES  . Colonoscopy with esophagogastroduodenoscopy (egd)  06-18-2002  . Esophagogastroduodenoscopy (egd) with esophageal dilation  06-12-2010  . Cholecystectomy  1990  . Negative sleep study  2012    PER PT  . Bladder suspension N/A 03/09/2014    Procedure: CYSTOSCOPY/SLING;  Surgeon: Reece Packer, MD;  Location: [facility];  Service: Urology;  Laterality: N/A;    Family History  Problem Relation Age of Onset  . Diabetes Mother   . Hypertension Mother   . Cancer Mother   . Diabetes Father   . Hypertension Father   . Diabetes Brother     Social History:  reports that she has never smoked. She has never used smokeless tobacco. She reports that she does not drink alcohol or use illicit drugs.  Review of Systems   Creatinine was worse upto 1.6 previously, no recent levels available. Not taking meloxicam now    Lab Results  Component Value Date   CREATININE 1.4* 03/18/2014    She sees ophthalmologist regularly  HYPERTENSION:  blood pressure at home upto 184/96  No history of recent pedal edema  HYPERLIPIDEMIA: Calculated LDL is  at goal with using regularly pravastatin prescribed by PCP  Lab Results  Component Value Date   CHOL 178 11/13/2013   HDL 58.30 11/13/2013   LDLCALC 99 11/13/2013   LDLDIRECT 152.8 10/09/2013   TRIG 105.0 11/13/2013  CHOLHDL 3 11/13/2013     Examination:   BP 144/84  Pulse 89  Temp(Src) 98.8 F (37.1 C) (Oral)  Ht 5\' 4"  (1.626 m)  Wt 195 lb (88.451 kg)  BMI 33.46 kg/m2  SpO2 98%  Body mass index is 33.46 kg/(m^2).   No ankle edema  Assesment/PLAN:   Diabetes type 2, uncontrolled  Her blood sugars are overall higher but mostly on waking up She does have a relatively lower basal rate overnight and this was previously done because of tendency to late-night hypoglycemia A1c is pending Discussed that  she does not need to take Levemir insulin since she is on an insulin pump Will need to increase her overnight blood sugars significantly but reduce her doses after about 5 PM May also consider reducing carbohydrate coverage at suppertime although she is eating small meals and inconsistently She also needs to bolus consistently everyday She is not a candidate for restarting Invokana since her renal function is borderline and she probably did not benefit from this previously Will continue Victoza  Discussed day-to-day management and proper timing of boluses, balanced meals and regular exercise New basal rate: Midnight = 0.7, 3 AM = 0.9, 7 AM = 1.9, 11 AM = 3.7, 5 PM = 2.3 and 9 PM = 1.6  Hypertension: This is poorly controlled, will need to increase her Aldactone to at least 50 mg and consider stopping potassium May also need to consider evaluation for hyperaldosteronism  Renal insufficiency: She will need followup with PCP for evaluation, to check creatinine today  Counseling time over 50% of today's 25 minute visit   Patient Instructions  Stop Levemir  Take 2 of Spironlactone     [facility] 05/20/2014, 4:34 PM

## 2014-05-20 NOTE — Patient Instructions (Addendum)
Stop Levemir  Take 2 of Spironlactone

## 2014-05-21 LAB — BASIC METABOLIC PANEL
BUN: 16 mg/dL (ref 6–23)
CALCIUM: 9.2 mg/dL (ref 8.4–10.5)
CO2: 22 mEq/L (ref 19–32)
Chloride: 102 mEq/L (ref 96–112)
Creatinine, Ser: 1.5 mg/dL — ABNORMAL HIGH (ref 0.4–1.2)
GFR: 45.27 mL/min — AB (ref >60.00)
Glucose, Bld: 268 mg/dL — ABNORMAL HIGH (ref 70–99)
Potassium: 4.3 mEq/L (ref 3.5–5.1)
SODIUM: 132 meq/L — AB (ref 135–145)

## 2014-05-21 LAB — HEMOGLOBIN A1C: HEMOGLOBIN A1C: 8.8 % — AB (ref 4.6–6.5)

## 2014-05-21 NOTE — Progress Notes (Signed)
Quick Note:  A1c is higher at 8.8. Potassium level is better and can stop taking potassium pills. Kidney test is still slightly abnormal, please fax to PCP ______

## 2014-05-31 ENCOUNTER — Other Ambulatory Visit: Payer: Self-pay | Admitting: *Deleted

## 2014-05-31 MED ORDER — PRAVASTATIN SODIUM 40 MG PO TABS: 40.0000 mg | ORAL_TABLET | Freq: Every morning | ORAL | Status: DC

## 2014-06-06 ENCOUNTER — Encounter (HOSPITAL_COMMUNITY): Payer: Self-pay | Admitting: Emergency Medicine

## 2014-06-06 ENCOUNTER — Emergency Department (HOSPITAL_COMMUNITY)
Admission: EM | Admit: 2014-06-06 | Discharge: 2014-06-06 | Disposition: A | Discharge status: Home / Self Care | Payer: PRIVATE HEALTH INSURANCE | Attending: Emergency Medicine | Admitting: Emergency Medicine

## 2014-06-06 ENCOUNTER — Emergency Department (HOSPITAL_COMMUNITY): Payer: PRIVATE HEALTH INSURANCE

## 2014-06-06 DIAGNOSIS — Z87448 Personal history of other diseases of urinary system: Secondary | ICD-10-CM | POA: Diagnosis not present

## 2014-06-06 DIAGNOSIS — Z9889 Other specified postprocedural states: Secondary | ICD-10-CM | POA: Diagnosis not present

## 2014-06-06 DIAGNOSIS — Z79899 Other long term (current) drug therapy: Secondary | ICD-10-CM | POA: Insufficient documentation

## 2014-06-06 DIAGNOSIS — Z8742 Personal history of other diseases of the female genital tract: Secondary | ICD-10-CM | POA: Diagnosis not present

## 2014-06-06 DIAGNOSIS — K219 Gastro-esophageal reflux disease without esophagitis: Secondary | ICD-10-CM | POA: Diagnosis not present

## 2014-06-06 DIAGNOSIS — I10 Essential (primary) hypertension: Secondary | ICD-10-CM | POA: Insufficient documentation

## 2014-06-06 DIAGNOSIS — Z96659 Presence of unspecified artificial knee joint: Secondary | ICD-10-CM | POA: Insufficient documentation

## 2014-06-06 DIAGNOSIS — E119 Type 2 diabetes mellitus without complications: Secondary | ICD-10-CM | POA: Insufficient documentation

## 2014-06-06 DIAGNOSIS — Z973 Presence of spectacles and contact lenses: Secondary | ICD-10-CM | POA: Insufficient documentation

## 2014-06-06 DIAGNOSIS — M1990 Unspecified osteoarthritis, unspecified site: Secondary | ICD-10-CM | POA: Insufficient documentation

## 2014-06-06 DIAGNOSIS — M25562 Pain in left knee: Secondary | ICD-10-CM | POA: Diagnosis not present

## 2014-06-06 DIAGNOSIS — J45909 Unspecified asthma, uncomplicated: Secondary | ICD-10-CM | POA: Insufficient documentation

## 2014-06-06 DIAGNOSIS — E785 Hyperlipidemia, unspecified: Secondary | ICD-10-CM | POA: Diagnosis not present

## 2014-06-06 DIAGNOSIS — Z98811 Dental restoration status: Secondary | ICD-10-CM | POA: Diagnosis not present

## 2014-06-06 DIAGNOSIS — Z794 Long term (current) use of insulin: Secondary | ICD-10-CM | POA: Diagnosis not present

## 2014-06-06 DIAGNOSIS — M797 Fibromyalgia: Secondary | ICD-10-CM | POA: Diagnosis not present

## 2014-06-06 MED ORDER — IBUPROFEN 800 MG PO TABS: 800.0000 mg | ORAL_TABLET | Freq: Three times a day (TID) | ORAL | Status: DC | PRN

## 2014-06-06 MED ORDER — HYDROCODONE-ACETAMINOPHEN 5-325 MG PO TABS: 1.0000 | ORAL_TABLET | Freq: Four times a day (QID) | ORAL | Status: DC | PRN

## 2014-06-06 MED ORDER — HYDROCODONE-ACETAMINOPHEN 5-325 MG PO TABS
1.0000 | ORAL_TABLET | Freq: Once | ORAL | Status: AC
  Administered 2014-06-06: 1 via ORAL
  Filled 2014-06-06: qty 1

## 2014-06-06 MED ORDER — OXYCODONE-ACETAMINOPHEN 5-325 MG PO TABS: 1.0000 | ORAL_TABLET | Freq: Four times a day (QID) | ORAL | Status: DC | PRN

## 2014-06-06 NOTE — ED Notes (Addendum)
Pt c/o left knee pain and swelling x 2 weeks. Pt denies injury. Pt presents with knee brace of a friends already in place. Pt ambulatory in triage. Pt has applied ice all day today.

## 2014-06-06 NOTE — ED Provider Notes (Signed)
CSN: [id number]     Arrival date & time 06/06/14  1557 History  This chart was scribed for non-physician practitioner, Irena Cords, PA-C working with Virgel Manifold, MD by Frederich Balding, ED scribe. This patient was seen in room TR07C/TR07C and the patient's care was started at 5:21 PM.   Chief Complaint  Patient presents with  . Knee Pain   The history is provided by the patient. No language interpreter was used.   HPI Comments: [name] is a 63 y.o. female with history of diabetes, hypertension, arthritis and fibromyalgia who presents to the Emergency Department complaining of sharp, pulling left knee pain with associated swelling that started 2 weeks ago. Pain worsened 3 days ago. Reports intermittent redness and warmth to touch. Denies injury. Denies history of problems with her left knee. States her knee has been locking up. Bearing weight worsens the pain. Pt has taken ibuprofen and used a knee brace and ice with no relief.   Past Medical History  Diagnosis Date  . Hypertension   . Arthritis   . Fibromyalgia   . Hyperlipidemia   . SUI (stress urinary incontinence, female)   . Diabetes mellitus type 2, insulin dependent   . Insulin pump in place     since 12/ 2014--  [employer]  . GERD (gastroesophageal reflux disease)   . H/O hiatal hernia   . History of esophageal dilatation   . Renal insufficiency   . History of rhabdomyolysis     03/ 2015  . Asthma   . Wears glasses   . Full dentures    Past Surgical History  Procedure Laterality Date  . Carpal tunnel release Right 2000  . Knee arthroscopy Right 1989  &  2002  . Excision left upper arm mass  01-29-2014  . Cardiac catheterization  07-20-2009   DR [facility]  NORMAL  LVEF/  NORMAL CORONARY AND RENAL ARTERIES  . Colonoscopy with esophagogastroduodenoscopy (egd)  06-18-2002  . Esophagogastroduodenoscopy (egd) with esophageal dilation  06-12-2010  . Cholecystectomy  1990  . Negative sleep  study  2012    PER PT  . Bladder suspension N/A 03/09/2014    Procedure: CYSTOSCOPY/SLING;  Surgeon: Reece Packer, MD;  Location: [facility];  Service: Urology;  Laterality: N/A;   Family History  Problem Relation Age of Onset  . Diabetes Mother   . Hypertension Mother   . Cancer Mother   . Diabetes Father   . Hypertension Father   . Diabetes Brother    History  Substance Use Topics  . Smoking status: Never Smoker   . Smokeless tobacco: Never Used  . Alcohol Use: No   OB History   Grav Para Term Preterm Abortions TAB SAB Ect Mult Living   1 1 1       1      Review of Systems All other systems negative except as documented in the HPI. All pertinent positives and negatives as reviewed in the HPI.  Allergies  Codeine  Home Medications   Prior to Admission medications   Medication Sig Start Date End Date Taking? Authorizing Provider  ADVAIR DISKUS 250-50 MCG/DOSE AEPB Inhale 1 puff into the lungs 2 (two) times daily as needed.  12/23/13   Historical Provider, MD  albuterol (PROVENTIL HFA;VENTOLIN HFA) 108 (90 BASE) MCG/ACT inhaler Inhale 2 puffs into the lungs every 6 (six) hours as needed for wheezing or shortness of breath. For shortness of breath  Historical Provider, MD  amLODipine (NORVASC) 10 MG tablet Take 10 mg by mouth every evening.     Historical Provider, MD  cetirizine (ZYRTEC) 10 MG tablet Take 10 mg by mouth daily as needed for allergies.     Historical Provider, MD  cyclobenzaprine (FLEXERIL) 10 MG tablet Take 10 mg by mouth 3 (three) times daily as needed for muscle spasms.    Historical Provider, MD  docusate sodium (COLACE) 100 MG capsule Take 100 mg by mouth 2 (two) times daily as needed for mild constipation.    Historical Provider, MD  EPIPEN 2-PAK 0.3 MG/0.3ML SOAJ injection Inject 0.3 mg into the muscle once.  06/03/13   Historical Provider, MD  gabapentin (NEURONTIN) 300 MG capsule Take 300 mg by mouth 2 (two) times daily as needed  (for pain). If needed can use up to tid    Historical Provider, MD  glucagon (GLUCAGON EMERGENCY) 1 MG injection Inject 1 mg into the vein once as needed. 07/29/13   Elayne Snare, MD  glucose blood (BAYER CONTOUR NEXT TEST) test strip Use as instructed to check blood sugar 5 times per day dx code 250.02 04/14/14   Elayne Snare, MD  HYDROcodone-acetaminophen (NORCO) 5-325 MG per tablet Take 1 tablet by mouth every 6 (six) hours as needed for moderate pain. 03/09/14   Reece Packer, MD  hydrOXYzine (ATARAX/VISTARIL) 25 MG tablet Take 50 mg by mouth 2 (two) times daily as needed for itching. For itching    Historical Provider, MD  insulin aspart (NOVOLOG) 100 UNIT/ML injection Use max 60 units per day with insulin pump 05/07/14   Elayne Snare, MD  LEVEMIR FLEXTOUCH 100 UNIT/ML Pen Inject 15 Units into the skin daily at 10 pm.  11/18/13   Historical Provider, MD  Liraglutide (VICTOZA) 18 MG/3ML SOLN Inject 1.2 mg into the skin every morning.     Historical Provider, MD  losartan-hydrochlorothiazide (HYZAAR) 100-25 MG per tablet Take 1 tablet by mouth every morning.     Historical Provider, MD  Multiple Vitamin (MULITIVITAMIN WITH MINERALS) TABS Take 1 tablet by mouth daily.    Historical Provider, MD  oxyCODONE-acetaminophen (ROXICET) 5-325 MG per tablet Take 1 tablet by mouth every 6 (six) hours as needed. 01/29/14 01/29/15  Michael Boston, MD  pantoprazole (PROTONIX) 40 MG tablet Take 40 mg by mouth every evening.    Historical Provider, MD  potassium chloride SA (K-DUR,KLOR-CON) 20 MEQ tablet Take 20 mEq by mouth every morning. 01/27/14   Elayne Snare, MD  pravastatin (PRAVACHOL) 40 MG tablet Take 1 tablet (40 mg total) by mouth every morning. 05/31/14   Elayne Snare, MD  PRESCRIPTION MEDICATION Inject as directed every other day. ALLERGY INJECTIONS  EVERY OTHER DAY    Historical Provider, MD  solifenacin (VESICARE) 5 MG tablet Take 5 mg by mouth every morning.    Historical Provider, MD  spironolactone (ALDACTONE) 50  MG tablet Take 1 tablet (50 mg total) by mouth daily. 05/20/14   Elayne Snare, MD  trimethoprim (TRIMPEX) 100 MG tablet Take 100 mg by mouth daily.  11/05/13   Historical Provider, MD   BP 162/83  Pulse 94  Temp(Src) 98 F (36.7 C) (Oral)  Resp 18  Ht 5\' 4"  (1.626 m)  Wt 187 lb (84.823 kg)  BMI 32.08 kg/m2  SpO2 95%  Physical Exam  Nursing note and vitals reviewed. Constitutional: She is oriented to person, place, and time. She appears well-developed and well-nourished. No distress.  HENT:  Head: Normocephalic and atraumatic.  Eyes: Conjunctivae and EOM are normal.  Neck: Neck supple. No tracheal deviation present.  Cardiovascular: Normal rate.   Pulmonary/Chest: Effort normal. No respiratory distress.  Musculoskeletal: Normal range of motion.  Left knee with medial and lateral tenderness to palpaiton. Normal ROM. No warmth or redness.   Neurological: She is alert and oriented to person, place, and time.  Skin: Skin is warm and dry.  Psychiatric: She has a normal mood and affect. Her behavior is normal.    ED Course  Procedures (including critical care time)  DIAGNOSTIC STUDIES: Oxygen Saturation is 95% on RA, adequate by my interpretation.    COORDINATION OF CARE: 5:23 PM-Discussed treatment plan which includes pain medication an anti-inflammatory with pt at bedside and pt agreed to plan. Will give pt an orthopedic referral and advised her to follow up.    Imaging Review Dg Knee Complete 4 Views Left  06/06/2014   CLINICAL DATA:  Sharp left knee pain and locking for 3 days. Unable to put weight on left knee for 3 days.  EXAM: LEFT KNEE - COMPLETE 4+ VIEW  COMPARISON:  02/02/2009.  FINDINGS: No acute bony abnormality. Specifically, no fracture, subluxation, or dislocation. Soft tissues are intact. No visible joint effusion.  IMPRESSION: No acute bony abnormality.   Electronically Signed   By: Rolm Baptise M.D.   On: 06/06/2014 17:18      I personally performed the services  described in this documentation, which was scribed in my presence. The recorded information has been reviewed and is accurate.  Brent General, PA-C 06/06/14 1731

## 2014-06-06 NOTE — Discharge Instructions (Signed)
Return here as needed. Follow up the orthopedist provided. Ice and elevate the knee.

## 2014-06-06 NOTE — ED Notes (Signed)
Declined W/C at D/C and was escorted to lobby by RN. 

## 2014-06-09 NOTE — ED Provider Notes (Signed)
Medical screening examination/treatment/procedure(s) were performed by non-physician practitioner and as supervising physician I was immediately available for consultation/collaboration.   EKG Interpretation None       Virgel Manifold, MD 06/09/14 1902

## 2014-06-16 ENCOUNTER — Encounter: Payer: Self-pay | Admitting: Endocrinology

## 2014-06-16 ENCOUNTER — Ambulatory Visit (INDEPENDENT_AMBULATORY_CARE_PROVIDER_SITE_OTHER): Payer: PRIVATE HEALTH INSURANCE | Admitting: Endocrinology

## 2014-06-16 VITALS — BP 168/86 | HR 100 | Temp 98.2°F | Resp 14 | Ht 64.0 in | Wt 188.8 lb

## 2014-06-16 DIAGNOSIS — I10 Essential (primary) hypertension: Secondary | ICD-10-CM

## 2014-06-16 DIAGNOSIS — N182 Chronic kidney disease, stage 2 (mild): Secondary | ICD-10-CM

## 2014-06-16 DIAGNOSIS — IMO0002 Reserved for concepts with insufficient information to code with codable children: Secondary | ICD-10-CM

## 2014-06-16 DIAGNOSIS — E1165 Type 2 diabetes mellitus with hyperglycemia: Secondary | ICD-10-CM

## 2014-06-16 NOTE — Progress Notes (Signed)
Patient ID: [name], female   DOB: May 30, 1951, 63 y.o.   MRN: [id number]   Reason for Appointment: Diabetes follow-up   History of Present Illness   Diagnosis: Type 2, date of diagnosis:1985.    PAST history: She has had difficult to control diabetes requiring large doses of insulin especially at meal times. Because of poor control she was switched from NovoLog to U-500 insulin in 11/2012. At that time her A1c was 9%  Initially seemed to have better blood sugars. With no clear response with Invokana this was stopped and she was started on Actos. Her insulin was adjusted with higher mealtime coverage at breakfast and lunch and lower amounts at supper. Lantus requirement appeared to be more in the morning and evening dose was eventually stopped. She  has been given Victoza and Invokana later in an attempt to improve her sugars but with only limited success. Because of continued poor control with subcutaneous insulin and large insulin requirement she was switched to a [employer] insulin pump which she started on 07/29/13 Prior to starting insulin pump her blood sugar at home was averaging 273 With the U 500 insulin in the pump her blood sugars were dramatically better with rare hypoglycemia  She was switched to NovoLog insulin in her pump instead of U-500 insulin in 3/15 because of  episode of severe overnight hypoglycemia  RECENT history:  Her compliance with her pump management has been suboptimal and blood sugars have been poorly controlled in the last 2 months A1c was 8.8% She was given clear instructions on improving pump management especially with doing more regular glucose monitoring and bolusing for every meal on her last visit However she has not followed any instructions and is still having inadequate control because of noncompliance Also she did not understand the basal rate changes prescribed is taking are much smaller basal rate at 3 AM and a much larger basal rate at 9 PM for  unknown results Levemir insulin at bedtime was stopped on her last visit and not clear if she has done this, giving mixed answers today  Current blood sugar patterns and problems:  Her sugars are mostly high in the mornings before breakfast  Blood sugars may be sometimes better around 6-8 PM but not consistently and again higher recently   She has sporadic blood sugars throughout and on some days is taking 0-1 readings. Often not checking blood sugars before eating  Has only a couple of postprandial readings to help identify her postprandial patterns  She has minimal hypoglycemia with readings of 69 at about 9 PM not proceeded by boluses  Overall average blood sugar is high at 178  She does not always bolus for high blood sugars and is doing meal boluses only sporadically and only when blood sugar is over 150. She is afraid of hypoglycemia and was told by her family member not to do any boluses when blood sugars are normal  PREMEAL Breakfast Lunch Dinner Bedtime Overall  Glucose range:  110-232   155, 252   69-305   124-252    Mean/median:     178 +/-65     Pump settings  Basal rate midnight = 0.7, 3 AM = 0.4, 7 AM = 1.5, 11 AM = 3.5, 5 PM = 2.5 and 9 PM = 3.75 Carbohydrate coverage 1:5, correction 1: 25 and target 110-120, insulin duration 4 hours  Bolus events: 1.5 per day, 81% with correction BASAL total amount = 47 units, total insulin 60 units +/-  8.4, bolus percent use 22 %  Meals: 3 meals per day, at 11 am, 3 pm and 7 pm Carbohydrate intake: 35g daily on average.  Food preferences: She has oatmeal or cereal and juice,  toast  occasionally with egg, usually has a sandwich and salad for lunch and vegetables for dinner with fish or lean meat for protein. Will snack on apples, raisons or wheat thins.   Wt Readings from Last 3 Encounters:  06/16/14 188 lb 12.8 oz (85.639 kg)  06/06/14 187 lb (84.823 kg)  05/20/14 195 lb (88.451 kg)   Physical activity: exercise:  none Dietician visit: Most recent: 2012  Retinal exam: Most recent: 2010.   Complications: No retinopathy or known nephropathy.    Lab Results  Component Value Date   HGBA1C 8.8* 05/20/2014   HGBA1C 7.9* 01/12/2014   HGBA1C 7.2* 10/09/2013   Lab Results  Component Value Date   MICROALBUR 3.0* 03/16/2013   LDLCALC 99 11/13/2013   CREATININE 1.5* 05/20/2014       Medication List       This list is accurate as of: 06/16/14  1:24 PM.  Always use your most recent med list.               ADVAIR DISKUS 250-50 MCG/DOSE Aepb  Generic drug:  Fluticasone-Salmeterol  Inhale 1 puff into the lungs 2 (two) times daily as needed.     albuterol 108 (90 BASE) MCG/ACT inhaler  Commonly known as:  PROVENTIL HFA;VENTOLIN HFA  Inhale 2 puffs into the lungs every 6 (six) hours as needed for wheezing or shortness of breath. For shortness of breath     amLODipine 10 MG tablet  Commonly known as:  NORVASC  Take 10 mg by mouth every evening.     cetirizine 10 MG tablet  Commonly known as:  ZYRTEC  Take 10 mg by mouth daily as needed for allergies.     cyclobenzaprine 10 MG tablet  Commonly known as:  FLEXERIL  Take 10 mg by mouth 3 (three) times daily as needed for muscle spasms.     docusate sodium 100 MG capsule  Commonly known as:  COLACE  Take 100 mg by mouth 2 (two) times daily as needed for mild constipation.     EPIPEN 2-PAK 0.3 mg/0.3 mL Soaj injection  Generic drug:  EPINEPHrine  Inject 0.3 mg into the muscle once.     gabapentin 300 MG capsule  Commonly known as:  NEURONTIN  Take 300 mg by mouth 2 (two) times daily as needed (for pain). If needed can use up to tid     glucagon 1 MG injection  Commonly known as:  GLUCAGON EMERGENCY  Inject 1 mg into the vein once as needed.     glucose blood test strip  Commonly known as:  BAYER CONTOUR NEXT TEST  Use as instructed to check blood sugar 5 times per day dx code 250.02     hydrOXYzine 25 MG tablet  Commonly known as:   ATARAX/VISTARIL  Take 50 mg by mouth 2 (two) times daily as needed for itching. For itching     ibuprofen 800 MG tablet  Commonly known as:  ADVIL,MOTRIN  Take 1 tablet (800 mg total) by mouth every 8 (eight) hours as needed.     insulin aspart 100 UNIT/ML injection  Commonly known as:  novoLOG  Use max 60 units per day with insulin pump     LEVEMIR FLEXTOUCH 100 UNIT/ML Pen  Generic drug:  Insulin Detemir  Inject 15 Units into the skin daily at 10 pm.     losartan-hydrochlorothiazide 100-25 MG per tablet  Commonly known as:  HYZAAR  Take 1 tablet by mouth every morning.     multivitamin with minerals Tabs tablet  Take 1 tablet by mouth daily.     naproxen 500 MG tablet  Commonly known as:  NAPROSYN     oxyCODONE-acetaminophen 5-325 MG per tablet  Commonly known as:  ROXICET  Take 1 tablet by mouth every 6 (six) hours as needed.     oxyCODONE-acetaminophen 5-325 MG per tablet  Commonly known as:  PERCOCET/ROXICET  Take 1 tablet by mouth every 6 (six) hours as needed for severe pain.     pantoprazole 40 MG tablet  Commonly known as:  PROTONIX  Take 40 mg by mouth every evening.     potassium chloride SA 20 MEQ tablet  Commonly known as:  K-DUR,KLOR-CON  Take 20 mEq by mouth every morning.     pravastatin 40 MG tablet  Commonly known as:  PRAVACHOL  Take 1 tablet (40 mg total) by mouth every morning.     PRESCRIPTION MEDICATION  Inject as directed every other day. ALLERGY INJECTIONS  EVERY OTHER DAY     solifenacin 5 MG tablet  Commonly known as:  VESICARE  Take 5 mg by mouth every morning.     spironolactone 50 MG tablet  Commonly known as:  ALDACTONE  Take 1 tablet (50 mg total) by mouth daily.     trimethoprim 100 MG tablet  Commonly known as:  TRIMPEX  Take 100 mg by mouth daily.     VICTOZA 18 MG/3ML Soln injection  Generic drug:  Liraglutide  Inject 1.2 mg into the skin every morning.        Allergies:  Allergies  Allergen Reactions  . Codeine  Itching and Nausea And Vomiting    Past Medical History  Diagnosis Date  . Hypertension   . Arthritis   . Fibromyalgia   . Hyperlipidemia   . SUI (stress urinary incontinence, female)   . Diabetes mellitus type 2, insulin dependent   . Insulin pump in place     since 12/ 2014--  [employer]  . GERD (gastroesophageal reflux disease)   . H/O hiatal hernia   . History of esophageal dilatation   . Renal insufficiency   . History of rhabdomyolysis     03/ 2015  . Asthma   . Wears glasses   . Full dentures     Past Surgical History  Procedure Laterality Date  . Carpal tunnel release Right 2000  . Knee arthroscopy Right 1989  &  2002  . Excision left upper arm mass  01-29-2014  . Cardiac catheterization  07-20-2009   [facility]  NORMAL  LVEF/  NORMAL CORONARY AND RENAL ARTERIES  . Colonoscopy with esophagogastroduodenoscopy (egd)  06-18-2002  . Esophagogastroduodenoscopy (egd) with esophageal dilation  06-12-2010  . Cholecystectomy  1990  . Negative sleep study  2012    PER PT  . Bladder suspension N/A 03/09/2014    Procedure: CYSTOSCOPY/SLING;  Surgeon: Reece Packer, MD;  Location: [facility];  Service: Urology;  Laterality: N/A;    Family History  Problem Relation Age of Onset  . Diabetes Mother   . Hypertension Mother   . Cancer Mother   . Diabetes Father   . Hypertension Father   . Diabetes Brother  Social History:  reports that she has never smoked. She has never used smokeless tobacco. She reports that she does not drink alcohol or use illicit drugs.  Review of Systems   Creatinine was worse upto 1.6 previously,  recently 1.5 She is taking ibuprofen now from orthopedic surgeon   Lab Results  Component Value Date   CREATININE 1.5* 05/20/2014    She sees ophthalmologist regularly  HYPERTENSION:  blood pressure at home upto 168/94, Followed by PCP Aldactone was increased on her last visit to 50 mg  No history  of recent pedal edema   HYPERLIPIDEMIA: Calculated LDL is  at goal with using regularly pravastatin prescribed by PCP  Lab Results  Component Value Date   CHOL 178 11/13/2013   HDL 58.30 11/13/2013   LDLCALC 99 11/13/2013   LDLDIRECT 152.8 10/09/2013   TRIG 105.0 11/13/2013   CHOLHDL 3 11/13/2013     Examination:   BP 168/86  Pulse 100  Temp(Src) 98.2 F (36.8 C)  Resp 14  Ht 5\' 4"  (1.626 m)  Wt 188 lb 12.8 oz (85.639 kg)  BMI 32.39 kg/m2  SpO2 97%  Body mass index is 32.39 kg/(m^2).   No ankle edema  Assesment/PLAN:   Diabetes type 2, uncontrolled  Her blood sugars are overall higher but a little inconsistent  See history of present illness for detailed discussion of her current management, problems identified and blood sugar patterns She is still not understanding the need for taking boluses for every meal and snack containing carbohydrate She is also very inconsistent with checking blood sugars despite reminders; she needs to include post prandial readings Also discussed in detail the new basal rate changes and she thinks she can apply this correctly  Will continue Victoza  Discussed day-to-day management and proper timing of boluses, balanced meals and regular exercise New basal rate: Midnight = 0.7, 3 AM = 0.6, 7 AM =  2.0, 11 AM = 3.7, 5 PM =2.2  and 9 PM = 3.0 Blood sugar target 120  Hypertension: This is  not well controlled and followed by PCP  Advised her not to take 800 mg Motrin given by her orthopedic surgeon  May also need to consider evaluation for hyperaldosteronism  Renal insufficiency: She will need followup with PCP for  followup, needs to stop ibuprofen and get alternative pain medication  Counseling time over 50% of today's 25 minute visit   Mahaila Tischer 06/16/2014, 1:24 PM

## 2014-06-16 NOTE — Patient Instructions (Signed)
Please check blood sugars at least 2x daily, some of the time about 2 hours after any meal and 5 times per week on waking up.

## 2014-06-28 ENCOUNTER — Encounter: Payer: Self-pay | Admitting: Endocrinology

## 2014-07-20 ENCOUNTER — Other Ambulatory Visit (INDEPENDENT_AMBULATORY_CARE_PROVIDER_SITE_OTHER): Payer: PRIVATE HEALTH INSURANCE

## 2014-07-20 DIAGNOSIS — E1165 Type 2 diabetes mellitus with hyperglycemia: Secondary | ICD-10-CM

## 2014-07-20 DIAGNOSIS — IMO0002 Reserved for concepts with insufficient information to code with codable children: Secondary | ICD-10-CM

## 2014-07-20 LAB — LIPID PANEL
CHOLESTEROL: 184 mg/dL (ref 0–200)
HDL: 43.4 mg/dL (ref >39.00)
LDL CALC: 116 mg/dL — AB (ref 0–99)
NonHDL: 140.6
TRIGLYCERIDES: 121 mg/dL (ref 0.0–149.0)
Total CHOL/HDL Ratio: 4
VLDL: 24.2 mg/dL (ref 0.0–40.0)

## 2014-07-20 LAB — BASIC METABOLIC PANEL
BUN: 15 mg/dL (ref 6–23)
CHLORIDE: 105 meq/L (ref 96–112)
CO2: 25 meq/L (ref 19–32)
Calcium: 9 mg/dL (ref 8.4–10.5)
Creatinine, Ser: 1.4 mg/dL — ABNORMAL HIGH (ref 0.4–1.2)
GFR: 49.82 mL/min — ABNORMAL LOW (ref >60.00)
Glucose, Bld: 217 mg/dL — ABNORMAL HIGH (ref 70–99)
POTASSIUM: 3.8 meq/L (ref 3.5–5.1)
SODIUM: 141 meq/L (ref 135–145)

## 2014-07-23 LAB — FRUCTOSAMINE: FRUCTOSAMINE: 286 umol/L — AB (ref 190–270)

## 2014-07-28 ENCOUNTER — Ambulatory Visit (INDEPENDENT_AMBULATORY_CARE_PROVIDER_SITE_OTHER): Payer: PRIVATE HEALTH INSURANCE | Admitting: Endocrinology

## 2014-07-28 ENCOUNTER — Encounter: Payer: Self-pay | Admitting: Endocrinology

## 2014-07-28 ENCOUNTER — Other Ambulatory Visit: Payer: Self-pay | Admitting: *Deleted

## 2014-07-28 VITALS — BP 150/84 | HR 88 | Temp 98.0°F | Resp 14 | Ht 64.0 in | Wt 192.0 lb

## 2014-07-28 DIAGNOSIS — N182 Chronic kidney disease, stage 2 (mild): Secondary | ICD-10-CM

## 2014-07-28 DIAGNOSIS — E119 Type 2 diabetes mellitus without complications: Secondary | ICD-10-CM

## 2014-07-28 DIAGNOSIS — E1165 Type 2 diabetes mellitus with hyperglycemia: Secondary | ICD-10-CM

## 2014-07-28 DIAGNOSIS — I10 Essential (primary) hypertension: Secondary | ICD-10-CM

## 2014-07-28 DIAGNOSIS — IMO0002 Reserved for concepts with insufficient information to code with codable children: Secondary | ICD-10-CM

## 2014-07-28 LAB — MICROALBUMIN / CREATININE URINE RATIO
CREATININE, U: 167.8 mg/dL
MICROALB/CREAT RATIO: 0.7 mg/g (ref 0.0–30.0)
Microalb, Ur: 1.1 mg/dL (ref 0.0–1.9)

## 2014-07-28 NOTE — Progress Notes (Signed)
Patient ID: [name], female   DOB: 1950-10-08, 63 y.o.   MRN: [id number]   Reason for Appointment: Diabetes follow-up   History of Present Illness   Diagnosis: Type 2, date of diagnosis:1985.    PAST history: She has had difficult to control diabetes requiring large doses of insulin especially at meal times. Because of poor control she was switched from NovoLog to U-500 insulin in 11/2012. At that time her A1c was 9%  Initially seemed to have better blood sugars. With no clear response with Invokana this was stopped and she was started on Actos. Her insulin was adjusted with higher mealtime coverage at breakfast and lunch and lower amounts at supper. Lantus requirement appeared to be more in the morning and evening dose was eventually stopped. She was on Victoza and Invokana later in an attempt to improve her sugars but with only limited success. Because of continued poor control with subcutaneous insulin and large insulin requirement she was switched to a Medtronic insulin pump which she started on 07/29/13 Prior to starting insulin pump her blood sugar at home was averaging 273 With the U 500 insulin in the pump her blood sugars were dramatically better with rare hypoglycemia  She was switched to NovoLog insulin in her pump instead of U-500 insulin in 3/15 because of  episode of severe overnight hypoglycemia  RECENT history:  Her compliance with her pump management has been suboptimal again especially with bolusing as directed Her blood sugars have been relatively higher although gradually coming down with adjustments in her basal rates Her basal rates have been increased significantly in the  morning hours mostly A1c was 8.8% in 9/15 but fructosamine is only mildly increased now She was given repeated instructions on improving pump management including doing more regular glucose monitoring and bolusing for every meal on her last 2 visits Although she has checked her blood sugar more  often she is not bolusing consistently at meal times  However she has not followed any instructions and is still having inadequate control because of noncompliance Also she did not understand the basal rate changes prescribed is taking are much smaller basal rate at 3 AM and a much larger basal rate at 9 PM for unknown results Levemir insulin at bedtime was stopped on her last visit and not clear if she has done this, giving mixed answers today  Current blood sugar patterns and problems:  Has done only 1.4 boluses a day even though she may be eating up to 3 meals a day  Also still does not understand the need for bolusing for high blood  sugars regardless of whether she is eating are not; has occasional high readings over 200 without a bolus  Her sugars are mostly high in the mornings before breakfast  She is now starting to have periodic low blood sugars in the afternoons although inconsistent; most likely she has had some delayed hypoglycemia after exercise the last 2 days  On some days her blood sugars are much higher for no apparent reason although most likely she was not watching her diet on Thanksgiving weekend  Even though she is usually eating breakfast she will not bolus in the morning or into her carbohydrates  Overall average blood sugar is high at 165, previously 178   PRE-MEAL Breakfast Lunch Dinner Bedtime Overall  Glucose range:  168-220   63-313   75-214   80-307      Mean/median:  185     160  165+/-68  Pump settings  Basal rate midnight = 0.7, 3 AM = 0.6, 7 AM = 2.0, 11 AM = 3.5, 5 PM = 2.2 and 9 PM = 3.0 Carbohydrate coverage 1:5, correction 1: 25 and target 110-120, insulin duration 4 hours  Bolus events: 1.5 per day, 81% with correction BASAL total amount = 52.5 units, total insulin 64 units , bolus percent use 18 %  Meals: 2-3 meals per day, at 11 am, 3 pm and 7 pm Carbohydrate intake: 43 g daily on average.  Food preferences: She has oatmeal or cereal and  juice,  toast  occasionally with egg, usually has a sandwich and salad for lunch and vegetables for dinner with fish or lean meat for protein. Will snack on apples, raisons or wheat thins.   Wt Readings from Last 3 Encounters:  07/28/14 192 lb (87.091 kg)  06/16/14 188 lb 12.8 oz (85.639 kg)  06/06/14 187 lb (84.823 kg)   Physical activity: exercise: Walking at noon and 6 pm, 3/7; uses temp basal 50% upto 1 hour Dietician visit: Most recent: 2012  Retinal exam: Most recent: 2010.   Complications: No retinopathy or known nephropathy.    Lab Results  Component Value Date   HGBA1C 8.8* 05/20/2014   HGBA1C 7.9* 01/12/2014   HGBA1C 7.2* 10/09/2013   Lab Results  Component Value Date   MICROALBUR 3.0* 03/16/2013   LDLCALC 116* 07/20/2014   CREATININE 1.4* 07/20/2014       Medication List       This list is accurate as of: 07/28/14  2:05 PM.  Always use your most recent med list.               ADVAIR DISKUS 250-50 MCG/DOSE Aepb  Generic drug:  Fluticasone-Salmeterol  Inhale 1 puff into the lungs 2 (two) times daily as needed.     albuterol 108 (90 BASE) MCG/ACT inhaler  Commonly known as:  PROVENTIL HFA;VENTOLIN HFA  Inhale 2 puffs into the lungs every 6 (six) hours as needed for wheezing or shortness of breath. For shortness of breath     amLODipine 10 MG tablet  Commonly known as:  NORVASC  Take 10 mg by mouth every evening.     cetirizine 10 MG tablet  Commonly known as:  ZYRTEC  Take 10 mg by mouth daily as needed for allergies.     cyclobenzaprine 10 MG tablet  Commonly known as:  FLEXERIL  Take 10 mg by mouth 3 (three) times daily as needed for muscle spasms.     docusate sodium 100 MG capsule  Commonly known as:  COLACE  Take 100 mg by mouth 2 (two) times daily as needed for mild constipation.     EPIPEN 2-PAK 0.3 mg/0.3 mL Soaj injection  Generic drug:  EPINEPHrine  Inject 0.3 mg into the muscle once.     gabapentin 300 MG capsule  Commonly known as:   NEURONTIN  Take 300 mg by mouth 2 (two) times daily as needed (for pain). If needed can use up to tid     glucagon 1 MG injection  Commonly known as:  GLUCAGON EMERGENCY  Inject 1 mg into the vein once as needed.     glucose blood test strip  Commonly known as:  BAYER CONTOUR NEXT TEST  Use as instructed to check blood sugar 5 times per day dx code 250.02     hydrOXYzine 25 MG tablet  Commonly known as:  ATARAX/VISTARIL  Take 50 mg by mouth 2 (two)  times daily as needed for itching. For itching     ibuprofen 800 MG tablet  Commonly known as:  ADVIL,MOTRIN  Take 1 tablet (800 mg total) by mouth every 8 (eight) hours as needed.     insulin aspart 100 UNIT/ML injection  Commonly known as:  novoLOG  Use max 60 units per day with insulin pump     LEVEMIR FLEXTOUCH 100 UNIT/ML Pen  Generic drug:  Insulin Detemir  Inject 15 Units into the skin daily at 10 pm.     losartan-hydrochlorothiazide 100-25 MG per tablet  Commonly known as:  HYZAAR  Take 1 tablet by mouth every morning.     meloxicam 15 MG tablet  Commonly known as:  MOBIC     multivitamin with minerals Tabs tablet  Take 1 tablet by mouth daily.     naproxen 500 MG tablet  Commonly known as:  NAPROSYN     oxyCODONE-acetaminophen 5-325 MG per tablet  Commonly known as:  ROXICET  Take 1 tablet by mouth every 6 (six) hours as needed.     oxyCODONE-acetaminophen 5-325 MG per tablet  Commonly known as:  PERCOCET/ROXICET  Take 1 tablet by mouth every 6 (six) hours as needed for severe pain.     pantoprazole 40 MG tablet  Commonly known as:  PROTONIX  Take 40 mg by mouth every evening.     potassium chloride SA 20 MEQ tablet  Commonly known as:  K-DUR,KLOR-CON  Take 20 mEq by mouth every morning.     pravastatin 40 MG tablet  Commonly known as:  PRAVACHOL  Take 1 tablet (40 mg total) by mouth every morning.     PRESCRIPTION MEDICATION  Inject as directed every other day. ALLERGY INJECTIONS  EVERY OTHER DAY      solifenacin 5 MG tablet  Commonly known as:  VESICARE  Take 5 mg by mouth every morning.     spironolactone 50 MG tablet  Commonly known as:  ALDACTONE  Take 1 tablet (50 mg total) by mouth daily.     trimethoprim 100 MG tablet  Commonly known as:  TRIMPEX  Take 100 mg by mouth daily.     VICTOZA 18 MG/3ML Soln injection  Generic drug:  Liraglutide  Inject 1.2 mg into the skin every morning.        Allergies:  Allergies  Allergen Reactions  . Codeine Itching and Nausea And Vomiting    Past Medical History  Diagnosis Date  . Hypertension   . Arthritis   . Fibromyalgia   . Hyperlipidemia   . SUI (stress urinary incontinence, female)   . Diabetes mellitus type 2, insulin dependent   . Insulin pump in place     since 12/ 2014--  MEDTRONIC  . GERD (gastroesophageal reflux disease)   . H/O hiatal hernia   . History of esophageal dilatation   . Renal insufficiency   . History of rhabdomyolysis     03/ 2015  . Asthma   . Wears glasses   . Full dentures     Past Surgical History  Procedure Laterality Date  . Carpal tunnel release Right 2000  . Knee arthroscopy Right 1989  &  2002  . Excision left upper arm mass  01-29-2014  . Cardiac catheterization  07-20-2009   DR Stanton  NORMAL  LVEF/  NORMAL CORONARY AND RENAL ARTERIES  . Colonoscopy with esophagogastroduodenoscopy (egd)  06-18-2002  . Esophagogastroduodenoscopy (egd) with esophageal dilation  06-12-2010  .  Cholecystectomy  1990  . Negative sleep study  2012    PER PT  . Bladder suspension N/A 03/09/2014    Procedure: CYSTOSCOPY/SLING;  Surgeon: Reece Packer, MD;  Location: [facility];  Service: Urology;  Laterality: N/A;    Family History  Problem Relation Age of Onset  . Diabetes Mother   . Hypertension Mother   . Cancer Mother   . Diabetes Father   . Hypertension Father   . Diabetes Brother     Social History:  reports that she has never smoked. She has  never used smokeless tobacco. She reports that she does not drink alcohol or use illicit drugs.  Review of Systems   Creatinine was worse upto 1.6 previously,  recently 1.5 She is taking ibuprofen now from orthopedic surgeon   Lab Results  Component Value Date   CREATININE 1.4* 07/20/2014    She sees ophthalmologist regularly  HYPERTENSION:  blood pressure was145/85, Followed by PCP; higher at home Aldactone was increased  to 50 mg  No history of recent pedal edema   HYPERLIPIDEMIA: Calculated LDL is  at goal with using regularly pravastatin prescribed by PCP  Lab Results  Component Value Date   CHOL 184 07/20/2014   HDL 43.40 07/20/2014   LDLCALC 116* 07/20/2014   LDLDIRECT 152.8 10/09/2013   TRIG 121.0 07/20/2014   CHOLHDL 4 07/20/2014     Examination:   BP 148/90 mmHg  Pulse 88  Temp(Src) 98 F (36.7 C)  Resp 14  Ht 5\' 4"  (1.626 m)  Wt 192 lb (87.091 kg)  BMI 32.94 kg/m2  SpO2 98%  Body mass index is 32.94 kg/(m^2).   No ankle edema  Assesment/PLAN:   Diabetes type 2, uncontrolled  Her blood sugars are overall slightly better and her fructosamine is nearly normal See history of present illness for detailed discussion of her current management, problems identified and blood sugar patterns She does have significant fluctuation in her blood sugars related to variable diet and activity level She is still not understanding the need for taking boluses for every meal and snack containing carbohydrate as well as for high blood sugars when she is not eating a meal Recommendations: She is a little better with checking blood sugars but does not have many postprandial readings She will need more detailed education and will set up a follow-up with nurse educator Change basal rates 4 7 AM = 2.2, 11 AM = 3.9, 4 PM = 2.0 Reduce sensitivity to 1:30 instead of 25 to avoid overcorrection Bolus for every meal and snack Will continue Victoza  Discussed day-to-day  management and proper timing of boluses, balanced meals and regular exercise Blood sugar target 120-140  Hypertension: This is  not well controlled and followed by PCP  May also need to consider increasing Aldactone to 100 mg as her blood pressure is improving with this  Renal insufficiency: She will need followup with PCP for management  Counseling time over 50% of today's 25 minute visit   Tevis Dunavan 07/28/2014, 2:05 PM

## 2014-07-28 NOTE — Patient Instructions (Addendum)
Temp basal for 3 hours when starting exercise  BOLUS FOR ALL SUGARS OVER 140 AND ALL FOOD   May need 100mg  Spironolactone

## 2014-08-06 ENCOUNTER — Encounter (HOSPITAL_COMMUNITY): Payer: Self-pay

## 2014-08-06 ENCOUNTER — Encounter (HOSPITAL_COMMUNITY)
Admission: RE | Admit: 2014-08-06 | Discharge: 2014-08-06 | Disposition: A | Discharge status: Home / Self Care | Payer: PRIVATE HEALTH INSURANCE | Source: Ambulatory Visit | Attending: Orthopedic Surgery | Admitting: Orthopedic Surgery

## 2014-08-06 DIAGNOSIS — K219 Gastro-esophageal reflux disease without esophagitis: Secondary | ICD-10-CM | POA: Diagnosis not present

## 2014-08-06 DIAGNOSIS — Z01818 Encounter for other preprocedural examination: Secondary | ICD-10-CM | POA: Diagnosis present

## 2014-08-06 DIAGNOSIS — E785 Hyperlipidemia, unspecified: Secondary | ICD-10-CM | POA: Diagnosis not present

## 2014-08-06 DIAGNOSIS — I129 Hypertensive chronic kidney disease with stage 1 through stage 4 chronic kidney disease, or unspecified chronic kidney disease: Secondary | ICD-10-CM | POA: Diagnosis not present

## 2014-08-06 DIAGNOSIS — J45909 Unspecified asthma, uncomplicated: Secondary | ICD-10-CM | POA: Diagnosis not present

## 2014-08-06 DIAGNOSIS — N189 Chronic kidney disease, unspecified: Secondary | ICD-10-CM | POA: Diagnosis not present

## 2014-08-06 DIAGNOSIS — E119 Type 2 diabetes mellitus without complications: Secondary | ICD-10-CM | POA: Insufficient documentation

## 2014-08-06 DIAGNOSIS — Z794 Long term (current) use of insulin: Secondary | ICD-10-CM | POA: Insufficient documentation

## 2014-08-06 DIAGNOSIS — Z9641 Presence of insulin pump (external) (internal): Secondary | ICD-10-CM | POA: Diagnosis not present

## 2014-08-06 DIAGNOSIS — Z6833 Body mass index (BMI) 33.0-33.9, adult: Secondary | ICD-10-CM | POA: Diagnosis not present

## 2014-08-06 DIAGNOSIS — I252 Old myocardial infarction: Secondary | ICD-10-CM | POA: Insufficient documentation

## 2014-08-06 HISTORY — DX: Headache: R51

## 2014-08-06 HISTORY — DX: Headache, unspecified: R51.9

## 2014-08-06 LAB — BASIC METABOLIC PANEL
Anion gap: 15 (ref 5–15)
BUN: 13 mg/dL (ref 6–23)
CALCIUM: 9.6 mg/dL (ref 8.4–10.5)
CO2: 22 meq/L (ref 19–32)
Chloride: 99 mEq/L (ref 96–112)
Creatinine, Ser: 1.3 mg/dL — ABNORMAL HIGH (ref 0.50–1.10)
GFR calc Af Amer: 50 mL/min — ABNORMAL LOW (ref >90)
GFR calc non Af Amer: 43 mL/min — ABNORMAL LOW (ref >90)
Glucose, Bld: 311 mg/dL — ABNORMAL HIGH (ref 70–99)
Potassium: 4.2 mEq/L (ref 3.7–5.3)
SODIUM: 136 meq/L — AB (ref 137–147)

## 2014-08-06 LAB — CBC
HEMATOCRIT: 42.4 % (ref 36.0–46.0)
Hemoglobin: 13.8 g/dL (ref 12.0–15.0)
MCH: 28.9 pg (ref 26.0–34.0)
MCHC: 32.5 g/dL (ref 30.0–36.0)
MCV: 88.9 fL (ref 78.0–100.0)
PLATELETS: 290 10*3/uL (ref 150–400)
RBC: 4.77 MIL/uL (ref 3.87–5.11)
RDW: 12.8 % (ref 11.5–15.5)
WBC: 5.9 10*3/uL (ref 4.0–10.5)

## 2014-08-06 LAB — SURGICAL PCR SCREEN
MRSA, PCR: NEGATIVE
STAPHYLOCOCCUS AUREUS: NEGATIVE

## 2014-08-06 LAB — APTT: APTT: 33 s (ref 24–37)

## 2014-08-06 LAB — PROTIME-INR
INR: 1.07 (ref 0.00–1.49)
Prothrombin Time: 14 seconds (ref 11.6–15.2)

## 2014-08-06 NOTE — Pre-Procedure Instructions (Signed)
[name]  08/06/2014   Your procedure is scheduled on:  08/17/2014  Report to [facility] Admitting   ENTRANCE    A at 5:30 AM.  Call this number if you have problems the morning of surgery: [phone]   Remember:  STOP ADVIL 5 days prior to surgery   Do not eat food or drink liquids after midnight.  On MONDAY   Take these medicines the morning of surgery with A SIP OF WATER: use Advair if needed, leave Basal Rate in operation on your insulin pump,  May take Vesicare   Do not wear jewelry, make-up or nail polish.   Do not wear lotions, powders, or perfumes. You may wear deodorant.   Do not shave 48 hours prior to surgery.    Do not bring valuables to the hospital.  [facility] is not responsible                  for any belongings or valuables.               Contacts, dentures or bridgework may not be worn into surgery.   Leave suitcase in the car. After surgery it may be brought to your room.   For patients admitted to the hospital, discharge time is determined by your                treatment team.               Patients discharged the day of surgery will not be allowed to drive  home.  Name and phone number of your driver: with friend    Special Instructions: Special Instructions: [place] - Preparing for Surgery  Before surgery, you can play an important role.  Because skin is not sterile, your skin needs to be as free of germs as possible.  You can reduce the number of germs on you skin by washing with CHG (chlorahexidine gluconate) soap before surgery.  CHG is an antiseptic cleaner which kills germs and bonds with the skin to continue killing germs even after washing.  Please DO NOT use if you have an allergy to CHG or antibacterial soaps.  If your skin becomes reddened/irritated stop using the CHG and inform your nurse when you arrive at Short Stay.  Do not shave (including legs and underarms) for at least 48 hours prior to the first CHG shower.   You may shave your face.  Please follow these instructions carefully:   1.  Shower with CHG Soap the night before surgery and the  morning of Surgery.  2.  If you choose to wash your hair, wash your hair first as usual with your  normal shampoo.  3.  After you shampoo, rinse your hair and body thoroughly to remove the  Shampoo.  4.  Use CHG as you would any other liquid soap.  You can apply chg directly to the skin and wash gently with scrungie or a clean washcloth.  5.  Apply the CHG Soap to your body ONLY FROM THE NECK DOWN.    Do not use on open wounds or open sores.  Avoid contact with your eyes, ears, mouth and genitals (private parts).  Wash genitals (private parts)   with your normal soap.  6.  Wash thoroughly, paying special attention to the area where your surgery will be performed.  7.  Thoroughly rinse your body with warm water from the neck down.  8.  DO NOT  shower/wash with your normal soap after using and rinsing off   the CHG Soap.  9.  Pat yourself dry with a clean towel.            10.  Wear clean pajamas.            11.  Place clean sheets on your bed the night of your first shower and do not sleep with pets.  Day of Surgery  Do not apply any lotions/deodorants the morning of surgery.  Please wear clean clothes to the hospital/surgery center.   Please read over the following fact sheets that you were given: Pain Booklet, Coughing and Deep Breathing, Blood Transfusion Information, MRSA Information and Surgical Site Infection Prevention

## 2014-08-06 NOTE — Progress Notes (Signed)
Call to A. Carlis Abbott, Diabetes Nurse Coordination, informed of pt. & surg. Date.  Coached by Ms. Clark, pt. Informed at her appt. /w Dr. [name] on 12/16, to review /w him the handling of the pump infusion in prep for surg. & day of surgery.  Also pt. Informed that she may have IV insulin infusion, pt. Remarks that she is familiar with this management.

## 2014-08-06 NOTE — Progress Notes (Signed)
Pt. Not aware or remembering cardiac records fr. 2010, states she has not had anymore cardiac testing since 2010. Pt. Followed by Dr. Lorra Hals for PCP & Dr. [name] for endocrinology. Pt. Denies any chest concerns, no changes in breathing.

## 2014-08-09 NOTE — Progress Notes (Signed)
Anesthesia Chart Review:  Pt is 63 year old female scheduled for L unicompartmental knee on 08/17/2014 with Dr. Mardelle Matte.   PMH: HTN, DM (uses insulin pump), renal insufficiency, hyperlipidemia, asthma, GERD. BMI 33  Medications include: ASA, amlodipine, losartan/hctz, spironolactone, insulin  Endocrinologist is [name]. Will see him 12/16 and pt will discuss with him how to manage insulin pump perioperatively.  PCP is [facility].   Preoperative labs reviewed.  Cr 1.3. Glucose 311. Last HgbA1C 8.8 is from 04/2014.   EKG: Sinus rhythm. Anteroseptal infarct, old. artifact in I and II mildly limiting interpretation. No significant change since last tracing  Cardiac cath 2010:  -Normal LV function with moderate LVH.  -Normal coronary arteries with mild catheter-induced spasm of proximal RCA normalizing following IV nitro administration  -normal renal arteries.   If no changes, I anticipate pt can proceed with surgery as scheduled.   Willeen Cass, FNP-BC [facility]/Anesthesiology Phone: [phone] 08/09/2014 4:27 PM

## 2014-08-11 ENCOUNTER — Encounter: Payer: PRIVATE HEALTH INSURANCE | Attending: Endocrinology | Admitting: Nutrition

## 2014-08-11 DIAGNOSIS — Z794 Long term (current) use of insulin: Secondary | ICD-10-CM | POA: Insufficient documentation

## 2014-08-11 DIAGNOSIS — Z713 Dietary counseling and surveillance: Secondary | ICD-10-CM | POA: Diagnosis not present

## 2014-08-11 DIAGNOSIS — E119 Type 2 diabetes mellitus without complications: Secondary | ICD-10-CM | POA: Diagnosis present

## 2014-08-11 NOTE — Progress Notes (Signed)
Patient is her, but did not bring meter.  Says FBSs are all less than 130.  Today it was 113.  All other blood sugars are usually less than 130 as well, except for one time when she forgot her insulin before the meal.  She had no questions about the pump usage.  She is very pleased with her pump.    Will be having knee replacement in 1 week and is concerned that they are not allowing her to wear her pump during the surgery.   We reviewed her carb counting, and it appears accurate, and we discussed what will happen when she has the surgery.  I explained that doctors like to do IV insulin drips and that it is just as good .  She will ask to have control of her pump after surgery to bolus before meals.  She had no final questions

## 2014-08-16 MED ORDER — CEFAZOLIN SODIUM-DEXTROSE 2-3 GM-% IV SOLR
2.0000 g | INTRAVENOUS | Status: AC
  Administered 2014-08-17: 2 g via INTRAVENOUS
  Filled 2014-08-16: qty 50

## 2014-08-16 NOTE — Anesthesia Preprocedure Evaluation (Addendum)
Anesthesia Evaluation  Patient identified by MRN, date of birth, ID band Patient awake    Reviewed: Allergy & Precautions, H&P , NPO status , Patient's Chart, lab work & pertinent test results, reviewed documented beta blocker date and time   Airway Mallampati: II   Neck ROM: Full    Dental  (+) Edentulous Upper, Edentulous Lower   Pulmonary asthma ,  + rhonchi         Cardiovascular hypertension, Pt. on medications + Peripheral Vascular Disease Rhythm:Regular  Cath 2010 normal EF   Neuro/Psych    GI/Hepatic GERD-  ,  Endo/Other  diabetes, Poorly Controlled, Type 2  Renal/GU GFR 60     Musculoskeletal   Abdominal (+) + obese,   Peds  Hematology   Anesthesia Other Findings   Reproductive/Obstetrics                            Anesthesia Physical Anesthesia Plan  ASA: III  Anesthesia Plan: General   Post-op Pain Management: MAC Combined w/ Regional for Post-op pain   Induction: Intravenous  Airway Management Planned: Oral ETT  Additional Equipment:   Intra-op Plan:   Post-operative Plan: Extubation in OR  Informed Consent: I have reviewed the patients History and Physical, chart, labs and discussed the procedure including the risks, benefits and alternatives for the proposed anesthesia with the patient or authorized representative who has indicated his/her understanding and acceptance.     Plan Discussed with:   Anesthesia Plan Comments:         Anesthesia Quick Evaluation

## 2014-08-17 ENCOUNTER — Encounter (HOSPITAL_COMMUNITY): Payer: Self-pay | Admitting: *Deleted

## 2014-08-17 ENCOUNTER — Inpatient Hospital Stay (HOSPITAL_COMMUNITY)
Admission: RE | Admit: 2014-08-17 | Discharge: 2014-08-19 | DRG: 470 | Disposition: A | Discharge status: Home / Self Care | Payer: PRIVATE HEALTH INSURANCE | Source: Ambulatory Visit | Attending: Orthopedic Surgery | Admitting: Orthopedic Surgery

## 2014-08-17 ENCOUNTER — Inpatient Hospital Stay (HOSPITAL_COMMUNITY): Payer: PRIVATE HEALTH INSURANCE | Admitting: Emergency Medicine

## 2014-08-17 ENCOUNTER — Encounter (HOSPITAL_COMMUNITY)
Admission: RE | Disposition: A | Discharge status: Home / Self Care | Payer: Self-pay | Source: Ambulatory Visit | Attending: Orthopedic Surgery

## 2014-08-17 ENCOUNTER — Inpatient Hospital Stay (HOSPITAL_COMMUNITY): Payer: PRIVATE HEALTH INSURANCE

## 2014-08-17 ENCOUNTER — Inpatient Hospital Stay (HOSPITAL_COMMUNITY): Payer: PRIVATE HEALTH INSURANCE | Admitting: Certified Registered Nurse Anesthetist

## 2014-08-17 DIAGNOSIS — E785 Hyperlipidemia, unspecified: Secondary | ICD-10-CM | POA: Diagnosis present

## 2014-08-17 DIAGNOSIS — M1710 Unilateral primary osteoarthritis, unspecified knee: Secondary | ICD-10-CM | POA: Diagnosis present

## 2014-08-17 DIAGNOSIS — M1712 Unilateral primary osteoarthritis, left knee: Principal | ICD-10-CM | POA: Diagnosis present

## 2014-08-17 DIAGNOSIS — Z794 Long term (current) use of insulin: Secondary | ICD-10-CM | POA: Diagnosis not present

## 2014-08-17 DIAGNOSIS — I10 Essential (primary) hypertension: Secondary | ICD-10-CM | POA: Diagnosis present

## 2014-08-17 DIAGNOSIS — Z885 Allergy status to narcotic agent status: Secondary | ICD-10-CM

## 2014-08-17 DIAGNOSIS — Z96652 Presence of left artificial knee joint: Secondary | ICD-10-CM

## 2014-08-17 DIAGNOSIS — K449 Diaphragmatic hernia without obstruction or gangrene: Secondary | ICD-10-CM | POA: Diagnosis present

## 2014-08-17 DIAGNOSIS — E119 Type 2 diabetes mellitus without complications: Secondary | ICD-10-CM | POA: Diagnosis present

## 2014-08-17 DIAGNOSIS — M797 Fibromyalgia: Secondary | ICD-10-CM | POA: Diagnosis present

## 2014-08-17 DIAGNOSIS — N289 Disorder of kidney and ureter, unspecified: Secondary | ICD-10-CM | POA: Diagnosis present

## 2014-08-17 DIAGNOSIS — M179 Osteoarthritis of knee, unspecified: Secondary | ICD-10-CM | POA: Diagnosis present

## 2014-08-17 DIAGNOSIS — J45909 Unspecified asthma, uncomplicated: Secondary | ICD-10-CM | POA: Diagnosis present

## 2014-08-17 DIAGNOSIS — K219 Gastro-esophageal reflux disease without esophagitis: Secondary | ICD-10-CM | POA: Diagnosis present

## 2014-08-17 DIAGNOSIS — Z7982 Long term (current) use of aspirin: Secondary | ICD-10-CM | POA: Diagnosis not present

## 2014-08-17 HISTORY — DX: Unilateral primary osteoarthritis, left knee: M17.12

## 2014-08-17 HISTORY — PX: PARTIAL KNEE ARTHROPLASTY: SHX2174

## 2014-08-17 LAB — GLUCOSE, CAPILLARY
GLUCOSE-CAPILLARY: 109 mg/dL — AB (ref 70–99)
GLUCOSE-CAPILLARY: 215 mg/dL — AB (ref 70–99)
GLUCOSE-CAPILLARY: 225 mg/dL — AB (ref 70–99)
Glucose-Capillary: 175 mg/dL — ABNORMAL HIGH (ref 70–99)
Glucose-Capillary: 74 mg/dL (ref 70–99)

## 2014-08-17 SURGERY — ARTHROPLASTY, KNEE, UNICOMPARTMENTAL
Anesthesia: General | Site: Knee | Laterality: Left

## 2014-08-17 MED ORDER — METOCLOPRAMIDE HCL 10 MG PO TABS: 5.0000 mg | ORAL_TABLET | Freq: Three times a day (TID) | ORAL | Status: DC | PRN

## 2014-08-17 MED ORDER — MAGNESIUM CITRATE PO SOLN: 1.0000 | Freq: Once | ORAL | Status: AC | PRN

## 2014-08-17 MED ORDER — FENTANYL CITRATE 0.05 MG/ML IJ SOLN
INTRAMUSCULAR | Status: DC | PRN
  Administered 2014-08-17 (×5): 50 ug via INTRAVENOUS

## 2014-08-17 MED ORDER — ACETAMINOPHEN 650 MG RE SUPP: 650.0000 mg | Freq: Four times a day (QID) | RECTAL | Status: DC | PRN

## 2014-08-17 MED ORDER — CEFAZOLIN SODIUM-DEXTROSE 2-3 GM-% IV SOLR
2.0000 g | Freq: Four times a day (QID) | INTRAVENOUS | Status: AC
  Administered 2014-08-17 (×2): 2 g via INTRAVENOUS
  Filled 2014-08-17 (×2): qty 50

## 2014-08-17 MED ORDER — KETOROLAC TROMETHAMINE 15 MG/ML IJ SOLN
INTRAMUSCULAR | Status: AC
  Filled 2014-08-17: qty 1

## 2014-08-17 MED ORDER — ONDANSETRON HCL 4 MG/2ML IJ SOLN
INTRAMUSCULAR | Status: DC | PRN
  Administered 2014-08-17: 4 mg via INTRAVENOUS

## 2014-08-17 MED ORDER — PROPOFOL 10 MG/ML IV BOLUS
INTRAVENOUS | Status: DC | PRN
  Administered 2014-08-17: 20 mg via INTRAVENOUS
  Administered 2014-08-17: 150 mg via INTRAVENOUS
  Administered 2014-08-17: 30 mg via INTRAVENOUS

## 2014-08-17 MED ORDER — NEOSTIGMINE METHYLSULFATE 10 MG/10ML IV SOLN
INTRAVENOUS | Status: AC
  Filled 2014-08-17: qty 1

## 2014-08-17 MED ORDER — AMLODIPINE BESYLATE 10 MG PO TABS
10.0000 mg | ORAL_TABLET | Freq: Every evening | ORAL | Status: DC
Start: 2014-08-17 — End: 2014-08-19
  Administered 2014-08-17 – 2014-08-18 (×2): 10 mg via ORAL
  Filled 2014-08-17 (×3): qty 1

## 2014-08-17 MED ORDER — ONDANSETRON HCL 4 MG/2ML IJ SOLN
4.0000 mg | Freq: Four times a day (QID) | INTRAMUSCULAR | Status: DC | PRN
  Administered 2014-08-17 – 2014-08-19 (×2): 4 mg via INTRAVENOUS
  Filled 2014-08-17 (×2): qty 2

## 2014-08-17 MED ORDER — ONDANSETRON HCL 4 MG PO TABS: 4.0000 mg | ORAL_TABLET | Freq: Four times a day (QID) | ORAL | Status: DC | PRN

## 2014-08-17 MED ORDER — LIDOCAINE HCL (CARDIAC) 20 MG/ML IV SOLN
INTRAVENOUS | Status: AC
  Filled 2014-08-17: qty 5

## 2014-08-17 MED ORDER — FENTANYL CITRATE 0.05 MG/ML IJ SOLN
INTRAMUSCULAR | Status: AC
  Filled 2014-08-17: qty 5

## 2014-08-17 MED ORDER — ALUM & MAG HYDROXIDE-SIMETH 200-200-20 MG/5ML PO SUSP
30.0000 mL | ORAL | Status: DC | PRN
Start: 2014-08-17 — End: 2014-08-19

## 2014-08-17 MED ORDER — MIDAZOLAM HCL 5 MG/5ML IJ SOLN
INTRAMUSCULAR | Status: DC | PRN
  Administered 2014-08-17 (×2): 1 mg via INTRAVENOUS

## 2014-08-17 MED ORDER — MEPERIDINE HCL 25 MG/ML IJ SOLN: 6.2500 mg | INTRAMUSCULAR | Status: DC | PRN

## 2014-08-17 MED ORDER — LOSARTAN POTASSIUM-HCTZ 100-25 MG PO TABS: 2.0000 | ORAL_TABLET | Freq: Every day | ORAL | Status: DC

## 2014-08-17 MED ORDER — MENTHOL 3 MG MT LOZG
1.0000 | LOZENGE | OROMUCOSAL | Status: DC | PRN
  Filled 2014-08-17: qty 9

## 2014-08-17 MED ORDER — FENTANYL CITRATE 0.05 MG/ML IJ SOLN: 25.0000 ug | INTRAMUSCULAR | Status: DC | PRN

## 2014-08-17 MED ORDER — OXYCODONE HCL 5 MG PO TABS
5.0000 mg | ORAL_TABLET | ORAL | Status: DC | PRN
  Administered 2014-08-17: 5 mg via ORAL
  Administered 2014-08-18 – 2014-08-19 (×5): 10 mg via ORAL
  Filled 2014-08-17 (×5): qty 2

## 2014-08-17 MED ORDER — ROCURONIUM BROMIDE 100 MG/10ML IV SOLN
INTRAVENOUS | Status: DC | PRN
  Administered 2014-08-17: 40 mg via INTRAVENOUS

## 2014-08-17 MED ORDER — SPIRONOLACTONE 50 MG PO TABS
50.0000 mg | ORAL_TABLET | Freq: Every day | ORAL | Status: DC
  Administered 2014-08-18 – 2014-08-19 (×2): 50 mg via ORAL
  Filled 2014-08-17 (×3): qty 1

## 2014-08-17 MED ORDER — DIPHENHYDRAMINE HCL 12.5 MG/5ML PO ELIX
12.5000 mg | ORAL_SOLUTION | ORAL | Status: DC | PRN
  Administered 2014-08-19: 12.5 mg via ORAL
  Filled 2014-08-17: qty 10

## 2014-08-17 MED ORDER — OXYCODONE-ACETAMINOPHEN 10-325 MG PO TABS: 1.0000 | ORAL_TABLET | Freq: Four times a day (QID) | ORAL | Status: DC | PRN

## 2014-08-17 MED ORDER — PANTOPRAZOLE SODIUM 40 MG PO TBEC
40.0000 mg | DELAYED_RELEASE_TABLET | Freq: Every evening | ORAL | Status: DC
  Administered 2014-08-18: 40 mg via ORAL
  Filled 2014-08-17: qty 1

## 2014-08-17 MED ORDER — METHOCARBAMOL 500 MG PO TABS
ORAL_TABLET | ORAL | Status: AC
  Filled 2014-08-17: qty 1

## 2014-08-17 MED ORDER — INSULIN PUMP
Freq: Three times a day (TID) | SUBCUTANEOUS | Status: DC
  Administered 2014-08-17 – 2014-08-19 (×7): via SUBCUTANEOUS
  Filled 2014-08-17: qty 1

## 2014-08-17 MED ORDER — METHOCARBAMOL 1000 MG/10ML IJ SOLN
500.0000 mg | Freq: Four times a day (QID) | INTRAVENOUS | Status: DC | PRN
  Filled 2014-08-17: qty 5

## 2014-08-17 MED ORDER — MOMETASONE FURO-FORMOTEROL FUM 100-5 MCG/ACT IN AERO
2.0000 | INHALATION_SPRAY | Freq: Two times a day (BID) | RESPIRATORY_TRACT | Status: DC
  Administered 2014-08-17 – 2014-08-19 (×4): 2 via RESPIRATORY_TRACT
  Filled 2014-08-17 (×2): qty 8.8

## 2014-08-17 MED ORDER — SODIUM CHLORIDE 0.9 % IR SOLN
Status: DC | PRN
  Administered 2014-08-17: 1000 mL

## 2014-08-17 MED ORDER — POLYETHYLENE GLYCOL 3350 17 G PO PACK: 17.0000 g | PACK | Freq: Every day | ORAL | Status: DC | PRN

## 2014-08-17 MED ORDER — METHOCARBAMOL 500 MG PO TABS
500.0000 mg | ORAL_TABLET | Freq: Four times a day (QID) | ORAL | Status: DC | PRN
  Administered 2014-08-17 – 2014-08-19 (×3): 500 mg via ORAL
  Filled 2014-08-17 (×2): qty 1

## 2014-08-17 MED ORDER — ONDANSETRON HCL 4 MG PO TABS: 4.0000 mg | ORAL_TABLET | Freq: Three times a day (TID) | ORAL | Status: DC | PRN

## 2014-08-17 MED ORDER — LIDOCAINE HCL (CARDIAC) 20 MG/ML IV SOLN
INTRAVENOUS | Status: DC | PRN
  Administered 2014-08-17 (×2): 50 mg via INTRAVENOUS

## 2014-08-17 MED ORDER — BUPIVACAINE-EPINEPHRINE (PF) 0.5% -1:200000 IJ SOLN
INTRAMUSCULAR | Status: DC | PRN
  Administered 2014-08-17: 30 mL via PERINEURAL

## 2014-08-17 MED ORDER — LIRAGLUTIDE 18 MG/3ML ~~LOC~~ SOLN: 1.2000 mg | Freq: Every morning | SUBCUTANEOUS | Status: DC

## 2014-08-17 MED ORDER — BUPIVACAINE HCL (PF) 0.25 % IJ SOLN
INTRAMUSCULAR | Status: AC
  Filled 2014-08-17: qty 30

## 2014-08-17 MED ORDER — NEOSTIGMINE METHYLSULFATE 10 MG/10ML IV SOLN
INTRAVENOUS | Status: DC | PRN
  Administered 2014-08-17: 3 mg via INTRAVENOUS

## 2014-08-17 MED ORDER — PROMETHAZINE HCL 25 MG/ML IJ SOLN
6.2500 mg | INTRAMUSCULAR | Status: DC | PRN
Start: 2014-08-17 — End: 2014-08-17

## 2014-08-17 MED ORDER — ONDANSETRON HCL 4 MG/2ML IJ SOLN
INTRAMUSCULAR | Status: AC
  Filled 2014-08-17: qty 2

## 2014-08-17 MED ORDER — LACTATED RINGERS IV SOLN
INTRAVENOUS | Status: DC
  Administered 2014-08-17: 07:00:00 via INTRAVENOUS

## 2014-08-17 MED ORDER — TRIMETHOPRIM 100 MG PO TABS
100.0000 mg | ORAL_TABLET | Freq: Every day | ORAL | Status: DC
  Administered 2014-08-17 – 2014-08-19 (×3): 100 mg via ORAL
  Filled 2014-08-17 (×3): qty 1

## 2014-08-17 MED ORDER — BACLOFEN 10 MG PO TABS: 10.0000 mg | ORAL_TABLET | Freq: Three times a day (TID) | ORAL | Status: DC

## 2014-08-17 MED ORDER — DOCUSATE SODIUM 100 MG PO CAPS
100.0000 mg | ORAL_CAPSULE | Freq: Two times a day (BID) | ORAL | Status: DC
  Administered 2014-08-17 – 2014-08-19 (×5): 100 mg via ORAL
  Filled 2014-08-17 (×5): qty 1

## 2014-08-17 MED ORDER — HYDROCHLOROTHIAZIDE 50 MG PO TABS
50.0000 mg | ORAL_TABLET | Freq: Every day | ORAL | Status: DC
  Administered 2014-08-17 – 2014-08-19 (×3): 50 mg via ORAL
  Filled 2014-08-17 (×3): qty 1

## 2014-08-17 MED ORDER — DOCUSATE SODIUM 100 MG PO CAPS: 100.0000 mg | ORAL_CAPSULE | Freq: Two times a day (BID) | ORAL | Status: DC | PRN

## 2014-08-17 MED ORDER — POTASSIUM CHLORIDE IN NACL 20-0.45 MEQ/L-% IV SOLN
INTRAVENOUS | Status: DC
  Administered 2014-08-17 – 2014-08-18 (×2): via INTRAVENOUS
  Filled 2014-08-17 (×6): qty 1000

## 2014-08-17 MED ORDER — LOSARTAN POTASSIUM 50 MG PO TABS
200.0000 mg | ORAL_TABLET | Freq: Every day | ORAL | Status: DC
  Administered 2014-08-17 – 2014-08-19 (×3): 200 mg via ORAL
  Filled 2014-08-17 (×3): qty 4

## 2014-08-17 MED ORDER — BISACODYL 10 MG RE SUPP: 10.0000 mg | Freq: Every day | RECTAL | Status: DC | PRN

## 2014-08-17 MED ORDER — PROPOFOL 10 MG/ML IV BOLUS
INTRAVENOUS | Status: AC
  Filled 2014-08-17: qty 20

## 2014-08-17 MED ORDER — INSULIN ASPART 100 UNIT/ML ~~LOC~~ SOLN: 0.0000 [IU] | Freq: Three times a day (TID) | SUBCUTANEOUS | Status: DC

## 2014-08-17 MED ORDER — GLYCOPYRROLATE 0.2 MG/ML IJ SOLN
INTRAMUSCULAR | Status: DC | PRN
  Administered 2014-08-17: 0.4 mg via INTRAVENOUS

## 2014-08-17 MED ORDER — PHENOL 1.4 % MT LIQD: 1.0000 | OROMUCOSAL | Status: DC | PRN

## 2014-08-17 MED ORDER — PRAVASTATIN SODIUM 40 MG PO TABS
40.0000 mg | ORAL_TABLET | Freq: Every day | ORAL | Status: DC
Start: 2014-08-17 — End: 2014-08-19
  Administered 2014-08-18: 40 mg via ORAL
  Filled 2014-08-17 (×3): qty 1

## 2014-08-17 MED ORDER — GLYCOPYRROLATE 0.2 MG/ML IJ SOLN
INTRAMUSCULAR | Status: AC
  Filled 2014-08-17: qty 3

## 2014-08-17 MED ORDER — ROCURONIUM BROMIDE 50 MG/5ML IV SOLN
INTRAVENOUS | Status: AC
Start: 2014-08-17 — End: 2014-08-17
  Filled 2014-08-17: qty 1

## 2014-08-17 MED ORDER — METOCLOPRAMIDE HCL 5 MG/ML IJ SOLN
5.0000 mg | Freq: Three times a day (TID) | INTRAMUSCULAR | Status: DC | PRN
  Administered 2014-08-17 (×2): 10 mg via INTRAVENOUS
  Filled 2014-08-17 (×2): qty 2

## 2014-08-17 MED ORDER — KETOROLAC TROMETHAMINE 15 MG/ML IJ SOLN
7.5000 mg | Freq: Four times a day (QID) | INTRAMUSCULAR | Status: AC
Start: 2014-08-17 — End: 2014-08-18
  Administered 2014-08-17 – 2014-08-18 (×4): 7.5 mg via INTRAVENOUS

## 2014-08-17 MED ORDER — OXYCODONE HCL 5 MG PO TABS
ORAL_TABLET | ORAL | Status: AC
  Filled 2014-08-17: qty 1

## 2014-08-17 MED ORDER — HYDROMORPHONE HCL 1 MG/ML IJ SOLN
1.0000 mg | INTRAMUSCULAR | Status: DC | PRN
  Administered 2014-08-17 (×2): 1 mg via INTRAVENOUS
  Filled 2014-08-17 (×2): qty 1

## 2014-08-17 MED ORDER — DARIFENACIN HYDROBROMIDE ER 7.5 MG PO TB24
7.5000 mg | ORAL_TABLET | Freq: Every day | ORAL | Status: DC
  Administered 2014-08-18 – 2014-08-19 (×2): 7.5 mg via ORAL
  Filled 2014-08-17 (×2): qty 1

## 2014-08-17 MED ORDER — SENNA-DOCUSATE SODIUM 8.6-50 MG PO TABS: 2.0000 | ORAL_TABLET | Freq: Every day | ORAL | Status: DC

## 2014-08-17 MED ORDER — SENNA 8.6 MG PO TABS
1.0000 | ORAL_TABLET | Freq: Two times a day (BID) | ORAL | Status: DC
  Administered 2014-08-17 – 2014-08-19 (×5): 8.6 mg via ORAL
  Filled 2014-08-17 (×7): qty 1

## 2014-08-17 MED ORDER — MIDAZOLAM HCL 2 MG/2ML IJ SOLN
INTRAMUSCULAR | Status: AC
  Filled 2014-08-17: qty 2

## 2014-08-17 MED ORDER — RIVAROXABAN 10 MG PO TABS: 10.0000 mg | ORAL_TABLET | Freq: Every day | ORAL | Status: DC

## 2014-08-17 MED ORDER — ACETAMINOPHEN 325 MG PO TABS: 650.0000 mg | ORAL_TABLET | Freq: Four times a day (QID) | ORAL | Status: DC | PRN

## 2014-08-17 MED ORDER — LACTATED RINGERS IV SOLN
INTRAVENOUS | Status: DC | PRN
  Administered 2014-08-17 (×2): via INTRAVENOUS

## 2014-08-17 MED ORDER — RIVAROXABAN 10 MG PO TABS
10.0000 mg | ORAL_TABLET | Freq: Every day | ORAL | Status: DC
  Administered 2014-08-18 – 2014-08-19 (×2): 10 mg via ORAL
  Filled 2014-08-17 (×3): qty 1

## 2014-08-17 SURGICAL SUPPLY — 78 items
APL SKNCLS STERI-STRIP NONHPOA (GAUZE/BANDAGES/DRESSINGS) ×1
BANDAGE ELASTIC 6 VELCRO ST LF (GAUZE/BANDAGES/DRESSINGS) ×3 IMPLANT
BANDAGE ESMARK 6X9 LF (GAUZE/BANDAGES/DRESSINGS) ×1 IMPLANT
BENZOIN TINCTURE PRP APPL 2/3 (GAUZE/BANDAGES/DRESSINGS) ×3 IMPLANT
BNDG CMPR 9X6 STRL LF SNTH (GAUZE/BANDAGES/DRESSINGS) ×1
BNDG ESMARK 6X9 LF (GAUZE/BANDAGES/DRESSINGS) ×3
BOWL SMART MIX CTS (DISPOSABLE) ×3 IMPLANT
CAPT KNEE PARTIAL 2 ×2 IMPLANT
CEMENT HV SMART SET (Cement) ×3 IMPLANT
CLOSURE STERI-STRIP 1/2X4 (GAUZE/BANDAGES/DRESSINGS) ×1
CLOSURE WOUND 1/2 X4 (GAUZE/BANDAGES/DRESSINGS) ×1
CLSR STERI-STRIP ANTIMIC 1/2X4 (GAUZE/BANDAGES/DRESSINGS) ×2 IMPLANT
COVER SURGICAL LIGHT HANDLE (MISCELLANEOUS) ×3 IMPLANT
CUFF TOURNIQUET SINGLE 34IN LL (TOURNIQUET CUFF) ×3 IMPLANT
DRAPE EXTREMITY T 121X128X90 (DRAPE) ×3 IMPLANT
DRAPE IMP U-DRAPE 54X76 (DRAPES) ×3 IMPLANT
DRAPE ORTHO SPLIT 77X108 STRL (DRAPES) ×3
DRAPE PROXIMA HALF (DRAPES) ×4 IMPLANT
DRAPE SURG ORHT 6 SPLT 77X108 (DRAPES) IMPLANT
DRAPE U-SHAPE 47X51 STRL (DRAPES) ×3 IMPLANT
DRSG PAD ABDOMINAL 8X10 ST (GAUZE/BANDAGES/DRESSINGS) ×2 IMPLANT
DURAPREP 26ML APPLICATOR (WOUND CARE) ×3 IMPLANT
DURAPREP 6ML APPLICATOR 50/CS (WOUND CARE) ×2 IMPLANT
ELECT CAUTERY BLADE 6.4 (BLADE) ×3 IMPLANT
ELECT REM PT RETURN 9FT ADLT (ELECTROSURGICAL) ×3
ELECTRODE REM PT RTRN 9FT ADLT (ELECTROSURGICAL) ×1 IMPLANT
FACESHIELD WRAPAROUND (MASK) ×3 IMPLANT
FACESHIELD WRAPAROUND OR TEAM (MASK) IMPLANT
GAUZE SPONGE 4X4 12PLY STRL (GAUZE/BANDAGES/DRESSINGS) ×3 IMPLANT
GLOVE BIO SURGEON STRL SZ7 (GLOVE) ×2 IMPLANT
GLOVE BIO SURGEON STRL SZ8 (GLOVE) ×2 IMPLANT
GLOVE BIOGEL PI IND STRL 6.5 (GLOVE) IMPLANT
GLOVE BIOGEL PI IND STRL 7.0 (GLOVE) IMPLANT
GLOVE BIOGEL PI IND STRL 7.5 (GLOVE) ×1 IMPLANT
GLOVE BIOGEL PI IND STRL 8 (GLOVE) IMPLANT
GLOVE BIOGEL PI INDICATOR 6.5 (GLOVE) ×2
GLOVE BIOGEL PI INDICATOR 7.0 (GLOVE) ×2
GLOVE BIOGEL PI INDICATOR 7.5 (GLOVE) ×2
GLOVE BIOGEL PI INDICATOR 8 (GLOVE) ×2
GLOVE BIOGEL PI ORTHO PRO SZ8 (GLOVE) ×2
GLOVE ECLIPSE 6.5 STRL STRAW (GLOVE) ×2 IMPLANT
GLOVE ORTHO TXT STRL SZ7.5 (GLOVE) ×3 IMPLANT
GLOVE PI ORTHO PRO STRL SZ8 (GLOVE) ×2 IMPLANT
GLOVE SURG ORTHO 8.0 STRL STRW (GLOVE) ×6 IMPLANT
GOWN STRL REUS W/ TWL LRG LVL3 (GOWN DISPOSABLE) ×1 IMPLANT
GOWN STRL REUS W/ TWL XL LVL3 (GOWN DISPOSABLE) ×1 IMPLANT
GOWN STRL REUS W/TWL 2XL LVL3 (GOWN DISPOSABLE) ×5 IMPLANT
GOWN STRL REUS W/TWL LRG LVL3 (GOWN DISPOSABLE) ×6
GOWN STRL REUS W/TWL XL LVL3 (GOWN DISPOSABLE) ×3
HANDPIECE INTERPULSE COAX TIP (DISPOSABLE) ×3
HOOD PEEL AWAY FACE SHEILD DIS (HOOD) ×6 IMPLANT
IMMOBILIZER KNEE 22 UNIV (SOFTGOODS) ×3 IMPLANT
KIT BASIN OR (CUSTOM PROCEDURE TRAY) ×3 IMPLANT
KIT ROOM TURNOVER OR (KITS) ×3 IMPLANT
MANIFOLD NEPTUNE II (INSTRUMENTS) ×3 IMPLANT
NDL HYPO 21X1.5 SAFETY (NEEDLE) IMPLANT
NEEDLE HYPO 21X1.5 SAFETY (NEEDLE) IMPLANT
NS IRRIG 1000ML POUR BTL (IV SOLUTION) ×3 IMPLANT
PACK BLADE SAW RECIP 70 3 PT (BLADE) ×2 IMPLANT
PACK TOTAL JOINT (CUSTOM PROCEDURE TRAY) ×3 IMPLANT
PACK UNIVERSAL I (CUSTOM PROCEDURE TRAY) ×3 IMPLANT
PAD ABD 8X10 STRL (GAUZE/BANDAGES/DRESSINGS) ×3 IMPLANT
PAD ARMBOARD 7.5X6 YLW CONV (MISCELLANEOUS) ×4 IMPLANT
PAD CAST 4YDX4 CTTN HI CHSV (CAST SUPPLIES) ×1 IMPLANT
PADDING CAST COTTON 4X4 STRL (CAST SUPPLIES) ×3
PADDING CAST COTTON 6X4 STRL (CAST SUPPLIES) ×3 IMPLANT
SET HNDPC FAN SPRY TIP SCT (DISPOSABLE) ×1 IMPLANT
STRIP CLOSURE SKIN 1/2X4 (GAUZE/BANDAGES/DRESSINGS) ×1 IMPLANT
SUCTION FRAZIER TIP 10 FR DISP (SUCTIONS) ×3 IMPLANT
SUT MNCRL AB 4-0 PS2 18 (SUTURE) ×2 IMPLANT
SUT VIC AB 0 CT1 27 (SUTURE) ×3
SUT VIC AB 0 CT1 27XBRD ANBCTR (SUTURE) ×1 IMPLANT
SUT VIC AB 1 CT1 27 (SUTURE) ×3
SUT VIC AB 1 CT1 27XBRD ANBCTR (SUTURE) ×1 IMPLANT
SUT VIC AB 3-0 SH 8-18 (SUTURE) ×3 IMPLANT
SYR CONTROL 10ML LL (SYRINGE) ×2 IMPLANT
TOWEL OR 17X24 6PK STRL BLUE (TOWEL DISPOSABLE) ×3 IMPLANT
TOWEL OR 17X26 10 PK STRL BLUE (TOWEL DISPOSABLE) ×3 IMPLANT

## 2014-08-17 NOTE — Progress Notes (Signed)
Utilization review completed.  

## 2014-08-17 NOTE — Anesthesia Postprocedure Evaluation (Signed)
  Anesthesia Post-op Note  Patient: [name]  Procedure(s) Performed: Procedure(s): LEFT UNICOMPARTMENTAL KNEE (Left)  Patient Location: PACU  Anesthesia Type:General  Level of Consciousness: awake and alert   Airway and Oxygen Therapy: Patient Spontanous Breathing and Patient connected to nasal cannula oxygen  Post-op Pain: mild  Post-op Assessment: Post-op Vital signs reviewed, Patient's Cardiovascular Status Stable and Patent Airway  Post-op Vital Signs: Reviewed and stable  Last Vitals:  Filed Vitals:   08/17/14 0545  BP: 181/101  Pulse: 88  Temp: 36.9 C  Resp: 18    Complications: No apparent anesthesia complications

## 2014-08-17 NOTE — Progress Notes (Signed)
Spoke with patient regarding diabetes and home regimen for diabetes management.  Patient states that she was diagnosed with diabetes over 30 years ago and she is followed by Dr. [name] for diabetes management. Patient uses a Medtronic insulin pump with Novolog insulin and Victoza 1.2 mg QAM as an outpatient for diabetes management. Patient last saw Dr. [name] on 07/28/14 and her A1C was 8.3% at that time. Patient has 2 basal patterns in her insulin pump and she states that she is currently using Pattern A which was programmed by Dr. [name] at her last visit. Patient states that she has been on the insulin pump for about a year and her A1C was in the 13% range before she started on the insulin pump.  Patient had insulin pump on when she arrived at the hospital this morning but it was removed during surgery.  Patient has insulin pump at the bedside and is in the process of connecting her insulin pump back up.    Current insulin pump settings are as follows:  Basal insulin  12A 0.7 units/hour 3A 0.4 units/hour 7A 1.9 units/hour 11A 3.7 units/hour 5P 2.3 units/hour 9p 3.35units/hour Total daily basal insulin: 52.75 units/24 hours  Carb Coverage 1:5 1 unit for every 5 grams of carbohydrates  Insulin Sensitivity 1:30 1 unit drops blood glucose 30 mg/dl  Target Glucose Goals 110-120 mg/dl  In talking with the patient she states that her blood glucose fluctuates up and down but reports that it has been better since Dr. [name] made the last changes with her insulin pump settings. Patient reports that she does have some low glucose readings after lunch sometimes and her glucose tends to go up late at night and into early morning. Explained insulin pump protocol and policy and patient is agreeable with hospital policy to continue her insulin pump as an inpatient.  Patient verbalized understanding of information discussed and states thats he does not have any further questions related to diabetes at this  time.  Thanks, Barnie Alderman, RN, MSN, CCRN, CDE Diabetes Coordinator Inpatient Diabetes Program [phone] (Team Pager) [phone] ([facility] office) [phone] [facility] office)

## 2014-08-17 NOTE — Addendum Note (Signed)
Addendum  created 08/17/14 1207 by Verdie Drown, CRNA   Modules edited: Charges VN

## 2014-08-17 NOTE — Anesthesia Procedure Notes (Signed)
Anesthesia Regional Block:  Femoral nerve block  Pre-Anesthetic Checklist: ,, timeout performed, Correct Patient, Correct Site, Correct Laterality, Correct Procedure, Correct Position, site marked, Risks and benefits discussed,  Surgical consent,  Pre-op evaluation,  At surgeon's request and post-op pain management  Laterality: Left  Prep: Maximum Sterile Barrier Precautions used and chloraprep       Needles:   Needle Type: Echogenic Stimulator Needle     Needle Length: 10cm 10 cm Needle Gauge: 21 and 21 G    Additional Needles:  Procedures: ultrasound guided (picture in chart) and nerve stimulator Femoral nerve block  Nerve Stimulator or Paresthesia:  Response: 0.4 mA,   Additional Responses:   Narrative:  Start time: 08/17/2014 7:10 AM End time: 08/17/2014 7:22 AM Injection made incrementally with aspirations every 5 mL. Anesthesiologist: Alexis Frock  Additional Notes: L [place] and stim guided femoral nerve block, 80ml .5% marcaine with epi, multiple asp, talked to patient throughout procedure, no complications

## 2014-08-17 NOTE — Transfer of Care (Signed)
Immediate Anesthesia Transfer of Care Note  Patient: [name]  Procedure(s) Performed: Procedure(s): LEFT UNICOMPARTMENTAL KNEE (Left)  Patient Location: PACU  Anesthesia Type:General and Regional  Level of Consciousness: awake, patient cooperative and responds to stimulation  Airway & Oxygen Therapy: Patient Spontanous Breathing and Patient connected to nasal cannula oxygen  Post-op Assessment: Report given to PACU RN and Post -op Vital signs reviewed and stable  Post vital signs: Reviewed and stable  Complications: No apparent anesthesia complications

## 2014-08-17 NOTE — H&P (Addendum)
PREOPERATIVE H&P  Chief Complaint: LEFT KNEE DJD  HPI: [name] is a 63 y.o. female who presents for preoperative history and physical with a diagnosis of LEFT KNEE DJD. Symptoms are rated as moderate to severe, and have been worsening.  This is significantly impairing activities of daily living.  She has elected for surgical management. She has failed injections, activity modification, anti-inflammatories, and assistive devices.   Past Medical History  Diagnosis Date  . Hypertension   . Fibromyalgia   . Hyperlipidemia   . SUI (stress urinary incontinence, female)   . Diabetes mellitus type 2, insulin dependent   . Insulin pump in place     since 12/ 2014--  [employer]  . GERD (gastroesophageal reflux disease)   . H/O hiatal hernia   . History of esophageal dilatation   . Renal insufficiency   . History of rhabdomyolysis     03/ 2015  . Asthma   . Wears glasses   . Full dentures   . Headache     h/o migraines - was followed for a time with a wellness doctor  . Arthritis     L knee, hands, back    Past Surgical History  Procedure Laterality Date  . Carpal tunnel release Right 2000  . Knee arthroscopy Right 1989  &  2002  . Excision left upper arm mass  01-29-2014  . Cardiac catheterization  07-20-2009   DR [place]  NORMAL  LVEF/  NORMAL CORONARY AND RENAL ARTERIES  . Colonoscopy with esophagogastroduodenoscopy (egd)  06-18-2002  . Esophagogastroduodenoscopy (egd) with esophageal dilation  06-12-2010  . Cholecystectomy  1990  . Negative sleep study  2012    PER PT  . Bladder suspension N/A 03/09/2014    Procedure: CYSTOSCOPY/SLING;  Surgeon: Reece Packer, MD;  Location: [facility];  Service: Urology;  Laterality: N/A;  . Eye surgery  2010    laser on R eye   History   Social History  . Marital Status: Divorced    Spouse Name: N/A    Number of Children: N/A  . Years of Education: N/A   Social History Main Topics   . Smoking status: Never Smoker   . Smokeless tobacco: Never Used  . Alcohol Use: No  . Drug Use: No  . Sexual Activity: None   Other Topics Concern  . None   Social History Narrative   Family History  Problem Relation Age of Onset  . Diabetes Mother   . Hypertension Mother   . Cancer Mother   . Diabetes Father   . Hypertension Father   . Diabetes Brother    Allergies  Allergen Reactions  . Codeine Itching and Nausea And Vomiting   Prior to Admission medications   Medication Sig Start Date End Date Taking? Authorizing Provider  ADVAIR DISKUS 250-50 MCG/DOSE AEPB Inhale 1 puff into the lungs 2 (two) times daily as needed (for shortness of breath).  12/23/13  Yes Historical Provider, MD  amLODipine (NORVASC) 10 MG tablet Take 10 mg by mouth every evening.    Yes Historical Provider, MD  aspirin 81 MG tablet Take 81 mg by mouth daily.   Yes Historical Provider, MD  docusate sodium (COLACE) 100 MG capsule Take 100 mg by mouth 2 (two) times daily as needed for mild constipation.   Yes Historical Provider, MD  glucagon (GLUCAGON EMERGENCY) 1 MG injection Inject 1 mg into the vein once as needed. Patient taking  differently: Inject 1 mg into the vein once as needed (for low blood sugar).  07/29/13  Yes Elayne Snare, MD  ibuprofen (ADVIL,MOTRIN) 200 MG tablet Take 400 mg by mouth every 6 (six) hours as needed.   Yes Historical Provider, MD  insulin aspart (NOVOLOG) 100 UNIT/ML injection Use max 60 units per day with insulin pump 05/07/14  Yes Elayne Snare, MD  Liraglutide (VICTOZA) 18 MG/3ML SOLN Inject 1.2 mg into the skin every morning.    Yes Historical Provider, MD  losartan-hydrochlorothiazide (HYZAAR) 100-25 MG per tablet Take 2 tablets by mouth daily.    Yes Historical Provider, MD  pantoprazole (PROTONIX) 40 MG tablet Take 40 mg by mouth every evening.   Yes Historical Provider, MD  pravastatin (PRAVACHOL) 40 MG tablet Take 1 tablet (40 mg total) by mouth every morning. 05/31/14  Yes  Elayne Snare, MD  solifenacin (VESICARE) 5 MG tablet Take 5 mg by mouth every morning.   Yes Historical Provider, MD  spironolactone (ALDACTONE) 50 MG tablet Take 1 tablet (50 mg total) by mouth daily. 05/20/14  Yes Elayne Snare, MD  trimethoprim (TRIMPEX) 100 MG tablet Take 100 mg by mouth daily.  11/05/13  Yes Historical Provider, MD  EPIPEN 2-PAK 0.3 MG/0.3ML SOAJ injection Inject 0.3 mg into the muscle once.  06/03/13   Historical Provider, MD  glucose blood (BAYER CONTOUR NEXT TEST) test strip Use as instructed to check blood sugar 5 times per day dx code 250.02 04/14/14   Elayne Snare, MD  ibuprofen (ADVIL,MOTRIN) 800 MG tablet Take 1 tablet (800 mg total) by mouth every 8 (eight) hours as needed. Patient not taking: Reported on 08/05/2014 06/06/14   Resa Miner Lawyer, PA-C  oxyCODONE-acetaminophen (PERCOCET/ROXICET) 5-325 MG per tablet Take 1 tablet by mouth every 6 (six) hours as needed for severe pain. Patient not taking: Reported on 08/05/2014 06/06/14   Resa Miner Lawyer, PA-C  oxyCODONE-acetaminophen (ROXICET) 5-325 MG per tablet Take 1 tablet by mouth every 6 (six) hours as needed. Patient not taking: Reported on 08/05/2014 01/29/14 01/29/15  Michael Boston, MD     Positive ROS: All other systems have been reviewed and were otherwise negative with the exception of those mentioned in the HPI and as above.  Physical Exam: General: Alert, no acute distress Cardiovascular: No pedal edema Respiratory: No cyanosis, no use of accessory musculature GI: No organomegaly, abdomen is soft and non-tender Skin: No lesions in the area of chief complaint Neurologic: Sensation intact distally Psychiatric: Patient is competent for consent with normal mood and affect Lymphatic: No axillary or cervical lymphadenopathy  MUSCULOSKELETAL: left knee has rom 0-125 degrees with medial crepitance and positive pain to palpation medially with pseudo-laxity.  X-rays demonstrate mild anteromedial osteoarthritis  with mild osteophyte formation, however the main finding is on MRI where she has severe subchondral tibial stress edema consistent with progressive degenerative changes.  Assessment: LEFT KNEE ANTEROMEDIAL OSTEOARTHRITIS  Plan: Plan for Procedure(s): LEFT UNICOMPARTMENTAL KNEE  The risks benefits and alternatives were discussed with the patient including but not limited to the risks of nonoperative treatment, versus surgical intervention including infection, bleeding, nerve injury,  blood clots, cardiopulmonary complications, morbidity, mortality, among others, and they were willing to proceed.   Johnny Bridge, MD Cell [phone]   08/17/2014 7:15 AM

## 2014-08-17 NOTE — Plan of Care (Signed)
Problem: Consults Goal: Diagnosis- Total Joint Replacement Partial/Uniknee: Left     

## 2014-08-17 NOTE — Op Note (Signed)
08/17/2014  9:29 AM  PATIENT:  [name]    PRE-OPERATIVE DIAGNOSIS:  Primary localized left knee osteoarthritis of the medial compartment  POST-OPERATIVE DIAGNOSIS:  Same  PROCEDURE:  LEFT UNICOMPARTMENTAL KNEE  SURGEON:  Johnny Bridge, MD  PHYSICIAN ASSISTANT: Joya Gaskins, OPA-C, present and scrubbed throughout the case, critical for completion in a timely fashion, and for retraction, instrumentation, and closure.  ANESTHESIA:   General  PREOPERATIVE INDICATIONS:  [name] is a  63 y.o. female with a diagnosis of LEFT KNEE DJD who failed conservative measures and elected for surgical management.  Her x-rays were fairly benign, although she did have joint space narrowing, with some slight tibial osteophyte formation, although her main finding was meniscal extrusion with strict severe stress reaction on the tibia.  The risks benefits and alternatives were discussed with the patient preoperatively including but not limited to the risks of infection, bleeding, nerve injury, cardiopulmonary complications, blood clots, the need for revision surgery, among others, and the patient was willing to proceed.  OPERATIVE IMPLANTS: Biomet [employer] mobile bearing medial compartment arthroplasty femur size small, tibia size B, bearing size 3.  OPERATIVE FINDINGS: The cartilage was still relatively preserved on the medial side, although there was a transverse line of osteophyte formation on the medial femoral condyle with pitting and erosive changes into the cartilage that went transversely at the junction of the middle and anterior portion of the medial femoral condyle. The MRI however was indicative of severe medial tibial edema consistent with stress reaction and progressive degenerative changes. No significant changes in the lateral or patellofemoral joint.  The ACL was intact.  OPERATIVE PROCEDURE: The patient was brought to the operating room placed in supine position. General  anesthesia was administered. IV antibiotics were given. The lower extremity was placed in the legholder and prepped and draped in usual sterile fashion.  Time out was performed.  The leg was elevated and exsanguinated and the tourniquet was inflated. Anteromedial incision was performed, and I took care to preserve the MCL. Parapatellar incision was carried out, and the osteophytes were excised, along with the medial meniscus and a small portion of the fat pad.  The extra medullary tibial cutting jig was applied, using the spoon and the 73mm G-Clamp and the 2 mm shim, and I took care to protect the anterior cruciate ligament insertion and the tibial spine. The medial collateral ligament was also protected, and I resected my proximal tibia, matching the anatomic slope. The initial cut appeared very deep, so actually cut on top of the 2 mm slotted guide, effectively only cutting with a +4 shim, although this did not get enough bone, I then cut with the 2 mm shim, which still did not give enough bony resection, and ended up cutting with the 0 mm shim.  The proximal tibial bony cut was removed, and I turned my attention to the femur.  The intramedullary femoral rod was placed using the drill, and then using the appropriate reference, I assembled the femoral jig, setting my posterior cutting block. I resected my posterior femur, used the 0 spigot for the anterior femur, and then measured my gap.   I then used the appropriate mill to match the extension gap to the flexion gap. The second milling was at a 3, and then I had to mill again with a 4, and even again with a 5.  The gaps were then measured again with the appropriate feeler gauges. Once I had balanced flexion and extension  gaps, I then completed the preparation of the femur.  I milled off the anterior aspect of the distal femur to prevent impingement. I also exposed the tibia, and selected the above-named component, and then used the cutting jig to  prepare the keel slot on the tibia. I also used the awl to curette out the bone to complete the preparation of the keel. The back wall was intact.  I then placed trial components, and it was found to have excellent motion, and appropriate balance.  I then cemented the components into place, cementing the tibia first, removing all excess cement, and then cementing the femur.  All loose cement was removed.  The real polyethylene insert was applied manually, and the knee was taken through functional range of motion, and found to have excellent stability and restoration of joint motion, with excellent balance.  The wounds were irrigated copiously, and the parapatellar tissue closed with Vicryl, followed by Vicryl for the subcutaneous tissue, with routine closure with Steri-Strips and sterile gauze.  The tourniquet was released, and the patient was awakened and extubated and returned to PACU in stable and satisfactory condition. There were no complications.

## 2014-08-17 NOTE — Evaluation (Signed)
Physical Therapy Evaluation Patient Details Name: [name] MRN: [id number] DOB: 11-29-1950 Today's Date: 08/17/2014   History of Present Illness  Pt admitted for left unicompartmental knee  Clinical Impression  Pt very pleasant and limited by nausea and vomiting prior to mobility initiation. RN aware and provided meds. Pt educated for HEP, precautions, transfers and plan. Pt will benefit from acute therapy to maximize mobility, gait, ROM and strength to return pt to PLOF and decrease burden of care.     Follow Up Recommendations Home health PT    Equipment Recommendations  Rolling walker with 5" wheels;3in1 (PT)    Recommendations for Other Services OT consult     Precautions / Restrictions Precautions Precautions: Knee Required Braces or Orthoses: Knee Immobilizer - Left Knee Immobilizer - Left: On when out of bed or walking;Discontinue post op day 2 Restrictions Weight Bearing Restrictions: Yes LLE Weight Bearing: Weight bearing as tolerated      Mobility  Bed Mobility Overal bed mobility: Needs Assistance Bed Mobility: Supine to Sit     Supine to sit: Min assist     General bed mobility comments: min assist to move LLE to EOB with cues and HOB elevated 30degrees  Transfers Overall transfer level: Needs assistance   Transfers: Sit to/from Stand Sit to Stand: Min guard         General transfer comment: cues for hand placement, sequence and safety  Ambulation/Gait Ambulation/Gait assistance: Min guard Ambulation Distance (Feet): 5 Feet Assistive device: Rolling walker (2 wheeled) Gait Pattern/deviations: Step-to pattern;Decreased stance time - left   Gait velocity interpretation: Below normal speed for age/gender General Gait Details: cues for RW use, sequence and safety  Stairs            Wheelchair Mobility    Modified Rankin (Stroke Patients Only)       Balance                                              Pertinent Vitals/Pain Pain Assessment: 0-10 Pain Score: 4  Pain Location: left knee Pain Descriptors / Indicators: Aching Pain Intervention(s): Repositioned;Ice applied;Premedicated before session    Home Living Family/patient expects to be discharged to:: Private residence Living Arrangements: Non-relatives/Friends Available Help at Discharge: Friend(s);Available 24 hours/day Type of Home: Apartment Home Access: Stairs to enter Entrance Stairs-Rails: Right Entrance Stairs-Number of Steps: flight Home Layout: One level Home Equipment: None      Prior Function Level of Independence: Independent               Hand Dominance        Extremity/Trunk Assessment   Upper Extremity Assessment: Overall WFL for tasks assessed           Lower Extremity Assessment: LLE deficits/detail   LLE Deficits / Details: limited by post op pain  Cervical / Trunk Assessment: Normal  Communication   Communication: No difficulties  Cognition Arousal/Alertness: Awake/alert Behavior During Therapy: WFL for tasks assessed/performed Overall Cognitive Status: Within Functional Limits for tasks assessed                      General Comments      Exercises Total Joint Exercises Ankle Circles/Pumps: AROM;Left;5 reps;Supine Quad Sets: AROM;Left;5 reps;Supine Heel Slides: AAROM;Left;5 reps;Supine      Assessment/Plan    PT Assessment Patient needs continued PT services  PT Diagnosis Difficulty walking;Acute pain   PT Problem List Decreased strength;Decreased range of motion;Decreased activity tolerance;Decreased mobility;Pain;Decreased knowledge of use of DME  PT Treatment Interventions Stair training;Gait training;DME instruction;Functional mobility training;Therapeutic activities;Therapeutic exercise;Patient/family education   PT Goals (Current goals can be found in the Care Plan section) Acute Rehab PT Goals Patient Stated Goal: return home PT Goal Formulation:  With patient Time For Goal Achievement: 08/24/14 Potential to Achieve Goals: Good    Frequency 7X/week   Barriers to discharge        Co-evaluation               End of Session   Activity Tolerance: Patient tolerated treatment well Patient left: in chair;with call bell/phone within reach Nurse Communication: Mobility status         Time: 2202-5427 PT Time Calculation (min) (ACUTE ONLY): 20 min   Charges:   PT Evaluation $Initial PT Evaluation Tier I: 1 Procedure PT Treatments $Therapeutic Activity: 8-22 mins   PT G CodesMelford Aase 08/17/2014, 1:05 PM Elwyn Reach, [place]

## 2014-08-18 ENCOUNTER — Encounter (HOSPITAL_COMMUNITY): Payer: Self-pay | Admitting: Orthopedic Surgery

## 2014-08-18 LAB — GLUCOSE, CAPILLARY
GLUCOSE-CAPILLARY: 122 mg/dL — AB (ref 70–99)
GLUCOSE-CAPILLARY: 50 mg/dL — AB (ref 70–99)
GLUCOSE-CAPILLARY: 67 mg/dL — AB (ref 70–99)
GLUCOSE-CAPILLARY: 73 mg/dL (ref 70–99)
GLUCOSE-CAPILLARY: 94 mg/dL (ref 70–99)
Glucose-Capillary: 54 mg/dL — ABNORMAL LOW (ref 70–99)
Glucose-Capillary: 76 mg/dL (ref 70–99)
Glucose-Capillary: 137 mg/dL — ABNORMAL HIGH (ref 70–99)

## 2014-08-18 LAB — CBC
HCT: 37 % (ref 36.0–46.0)
Hemoglobin: 12.2 g/dL (ref 12.0–15.0)
MCH: 30.2 pg (ref 26.0–34.0)
MCHC: 33 g/dL (ref 30.0–36.0)
MCV: 91.6 fL (ref 78.0–100.0)
PLATELETS: 275 10*3/uL (ref 150–400)
RBC: 4.04 MIL/uL (ref 3.87–5.11)
RDW: 13.2 % (ref 11.5–15.5)
WBC: 10.1 10*3/uL (ref 4.0–10.5)

## 2014-08-18 LAB — BASIC METABOLIC PANEL
ANION GAP: 11 (ref 5–15)
BUN: 15 mg/dL (ref 6–23)
CALCIUM: 8.4 mg/dL (ref 8.4–10.5)
CO2: 25 mmol/L (ref 19–32)
CREATININE: 1.35 mg/dL — AB (ref 0.50–1.10)
Chloride: 97 mEq/L (ref 96–112)
GFR calc Af Amer: 48 mL/min — ABNORMAL LOW (ref >90)
GFR calc non Af Amer: 41 mL/min — ABNORMAL LOW (ref >90)
Glucose, Bld: 102 mg/dL — ABNORMAL HIGH (ref 70–99)
Potassium: 3.8 mmol/L (ref 3.5–5.1)
Sodium: 133 mmol/L — ABNORMAL LOW (ref 135–145)

## 2014-08-18 MED ORDER — KETOROLAC TROMETHAMINE 15 MG/ML IJ SOLN
INTRAMUSCULAR | Status: AC
  Filled 2014-08-18: qty 1

## 2014-08-18 NOTE — Progress Notes (Signed)
Patient ID: BRANDE UNCAPHER, female   DOB: 23-Nov-1950, 63 y.o.   MRN: [id number]     Subjective:  Patient reports pain as mild to moderate.  Patient in bed and in no acute distress.  Denies any CP or SOB  Objective:   VITALS:   Filed Vitals:   08/17/14 1700 08/17/14 2200 08/18/14 0200 08/18/14 0556  BP:  136/68 126/60 116/50  Pulse:  76 75 83  Temp:  97.4 F (36.3 C) 98.7 F (37.1 C) 98 F (36.7 C)  TempSrc:      Resp:  16 16 16   Height: 5\' 4"  (1.626 m)     Weight: 86.637 kg (191 lb)     SpO2:  100% 97% 97%    ABD soft Sensation intact distally Dorsiflexion/Plantar flexion intact Incision: dressing C/D/I and no drainage Good foot and ankle motion. Patient ambulating with PT at the time of visit  Lab Results  Component Value Date   WBC 10.1 08/18/2014   HGB 12.2 08/18/2014   HCT 37.0 08/18/2014   MCV 91.6 08/18/2014   PLT 275 08/18/2014   BMET    Component Value Date/Time   NA 133* 08/18/2014 0403   K 3.8 08/18/2014 0403   CL 97 08/18/2014 0403   CO2 25 08/18/2014 0403   GLUCOSE 102* 08/18/2014 0403   BUN 15 08/18/2014 0403   CREATININE 1.35* 08/18/2014 0403   CALCIUM 8.4 08/18/2014 0403   GFRNONAA 41* 08/18/2014 0403   GFRAA 48* 08/18/2014 0403     Assessment/Plan: 1 Day Post-Op   Principal Problem:   Osteoarthritis of left knee, primary localized Active Problems:   Knee osteoarthritis   Advance diet Up with therapy WBAT Dry dressing PRN Plan for DC home tomorrow   Remonia Richter 08/18/2014, 8:07 AM  Seen and agree.    Marchia Bond, MD Cell [phone]

## 2014-08-18 NOTE — Progress Notes (Signed)
Physical Therapy Treatment Patient Details Name: [name] MRN: [id number] DOB: 1950/12/14 Today's Date: 08/18/2014    History of Present Illness Pt admitted for left unicompartmental knee    PT Comments    Pt very motivated and pleasant with therapy. Pt hopeful to D/C home tomorrow when roommate can be home to (A). Patient needs to practice stairs next session for reinforcement prior to D/C home.   Follow Up Recommendations  Home health PT     Equipment Recommendations  Rolling walker with 5" wheels;3in1 (PT)    Recommendations for Other Services       Precautions / Restrictions Precautions Precautions: Knee Precaution Comments: reviewed no pillow under knee Required Braces or Orthoses: Knee Immobilizer - Left Knee Immobilizer - Left: On when out of bed or walking;Discontinue post op day 2 Restrictions Weight Bearing Restrictions: Yes LLE Weight Bearing: Weight bearing as tolerated    Mobility  Bed Mobility Overal bed mobility: Needs Assistance Bed Mobility: Supine to Sit;Sit to Supine     Supine to sit: Supervision;HOB elevated Sit to supine: Supervision   General bed mobility comments: pt able to (A) Lt LE off EOB with use of Rt LE; handrails for support  Transfers Overall transfer level: Needs assistance Equipment used: Rolling walker (2 wheeled) Transfers: Sit to/from Stand Sit to Stand: Supervision         General transfer comment: cues for hand placement  Ambulation/Gait Ambulation/Gait assistance: Min guard Ambulation Distance (Feet): 220 Feet Assistive device: Rolling walker (2 wheeled) Gait Pattern/deviations: Step-to pattern;Decreased step length - right;Decreased stance time - left;Antalgic;Wide base of support Gait velocity: decreased Gait velocity interpretation: Below normal speed for age/gender General Gait Details: cues for RW management and step through gt    Stairs Stairs: Yes Stairs assistance: Min guard Stair  Management: Two rails;Step to pattern;Forwards Number of Stairs: 3 General stair comments: cues for gt sequencing and management technique   Wheelchair Mobility    Modified Rankin (Stroke Patients Only)       Balance Overall balance assessment: Needs assistance Sitting-balance support: Feet supported;No upper extremity supported Sitting balance-Leahy Scale: Normal     Standing balance support: During functional activity;Bilateral upper extremity supported Standing balance-Leahy Scale: Poor Standing balance comment: RW to balance                     Cognition Arousal/Alertness: Awake/alert Behavior During Therapy: WFL for tasks assessed/performed Overall Cognitive Status: Within Functional Limits for tasks assessed                      Exercises Total Joint Exercises Ankle Circles/Pumps: AROM;Both;10 reps;Supine Quad Sets: AROM;Strengthening;Left;10 reps;Seated Towel Squeeze: Both;10 reps Heel Slides: AAROM;Left;10 reps;Seated Hip ABduction/ADduction: AROM;Strengthening;Left;10 reps;Seated Straight Leg Raises: AROM;Left;10 reps;Supine Long Arc Quad: AROM;Left;10 reps;Seated    General Comments        Pertinent Vitals/Pain Pain Assessment: 0-10 Pain Score: 3  Pain Location: Lt knee Pain Descriptors / Indicators:  ("pulling") Pain Intervention(s): Monitored during session;Premedicated before session;Repositioned;Ice applied    Home Living                      Prior Function            PT Goals (current goals can now be found in the care plan section) Acute Rehab PT Goals Patient Stated Goal: home with my roommate tomorrow PT Goal Formulation: With patient Time For Goal Achievement: 08/24/14 Potential to Achieve Goals: Good Progress towards  PT goals: Progressing toward goals    Frequency  7X/week    PT Plan Current plan remains appropriate    Co-evaluation             End of Session Equipment Utilized During Treatment:  Gait belt;Left knee immobilizer Activity Tolerance: Patient tolerated treatment well Patient left: in bed;with call bell/phone within reach     Time: 1319-1343 PT Time Calculation (min) (ACUTE ONLY): 24 min  Charges:  $Gait Training: 8-22 mins $Therapeutic Exercise: 8-22 mins                    G CodesGustavus [name] , [place]  08/18/2014, 3:01 PM

## 2014-08-18 NOTE — Progress Notes (Signed)
Physical Therapy Treatment Patient Details Name: [name] MRN: [id number] DOB: April 01, 1951 Today's Date: 08/18/2014    History of Present Illness Pt admitted for left unicompartmental knee    PT Comments    Pt very pleasant and motivated to progress with therapy. Pt reports she would feel more comfortable D/C tomorrow due to incr caregiver (A) available tomorrow. Patient needs to practice stairs next session.     Follow Up Recommendations  Home health PT     Equipment Recommendations  Rolling walker with 5" wheels;3in1 (PT)    Recommendations for Other Services       Precautions / Restrictions Precautions Precautions: Knee Precaution Comments: reviewed no pillow under knee Required Braces or Orthoses: Knee Immobilizer - Left Knee Immobilizer - Left: On when out of bed or walking;Discontinue post op day 2 Restrictions Weight Bearing Restrictions: Yes LLE Weight Bearing: Weight bearing as tolerated    Mobility  Bed Mobility Overal bed mobility: Needs Assistance Bed Mobility: Supine to Sit     Supine to sit: Supervision     General bed mobility comments: HOB elevated   Transfers Overall transfer level: Needs assistance Equipment used: Rolling walker (2 wheeled) Transfers: Sit to/from Stand Sit to Stand: Supervision         General transfer comment: min cues for hand placement   Ambulation/Gait Ambulation/Gait assistance: Min guard Ambulation Distance (Feet): 110 Feet Assistive device: Rolling walker (2 wheeled) Gait Pattern/deviations: Antalgic;Decreased stance time - left;Decreased step length - right;Wide base of support Gait velocity: decreased Gait velocity interpretation: Below normal speed for age/gender General Gait Details: cues for RW safety and step through gt sequencing; pt c/o Lt LE buckling "at times"    Stairs            Wheelchair Mobility    Modified Rankin (Stroke Patients Only)       Balance Overall balance  assessment: Needs assistance Sitting-balance support: Feet supported;No upper extremity supported Sitting balance-Leahy Scale: Good     Standing balance support: During functional activity;Bilateral upper extremity supported Standing balance-Leahy Scale: Poor Standing balance comment: relies on RW for balance                    Cognition Arousal/Alertness: Awake/alert Behavior During Therapy: WFL for tasks assessed/performed Overall Cognitive Status: Within Functional Limits for tasks assessed                      Exercises Total Joint Exercises Ankle Circles/Pumps: AROM;Both;10 reps;Seated Quad Sets: AROM;Strengthening;Left;10 reps;Seated Heel Slides: AAROM;Left;10 reps;Seated Hip ABduction/ADduction: AROM;Strengthening;Left;10 reps;Seated Long Arc Quad: AROM;Left;10 reps;Seated Goniometric ROM: AROM 0 to 70 degrees    General Comments        Pertinent Vitals/Pain Pain Assessment: 0-10 Pain Score: 4  Pain Location: Lt knee; prior to activty pain 5/10; after 4/10 Pain Descriptors / Indicators:  ("stiffness") Pain Intervention(s): Monitored during session;Premedicated before session;Repositioned;Ice applied    Home Living                      Prior Function            PT Goals (current goals can now be found in the care plan section) Acute Rehab PT Goals Patient Stated Goal: return home PT Goal Formulation: With patient Time For Goal Achievement: 08/24/14 Potential to Achieve Goals: Good Progress towards PT goals: Progressing toward goals    Frequency  7X/week    PT Plan Current plan remains appropriate  Co-evaluation             End of Session Equipment Utilized During Treatment: Gait belt;Left knee immobilizer Activity Tolerance: Patient tolerated treatment well Patient left: in chair;with call bell/phone within reach     Time: 0758-0822 PT Time Calculation (min) (ACUTE ONLY): 24 min  Charges:  $Gait Training: 8-22  mins $Therapeutic Exercise: 8-22 mins                    G CodesGustavus Bryant, [place] 08/18/2014, 8:31 AM

## 2014-08-18 NOTE — Progress Notes (Signed)
Occupational Therapy Evaluation and Discharge Patient Details Name: [name] MRN: [id number] DOB: 1951-04-02 Today's Date: 08/18/2014    History of Present Illness Pt admitted for left unicompartmental knee   Clinical Impression   PTA pt lived at home and was independent with ADLs. Pt currently limited by pain and decreased ROM of LLE, however is overall at Supervision/setup level for ADLs and functional mobility. Pt demonstrated safe tub transfer and has no further acute OT needs.     Follow Up Recommendations  No OT follow up;Supervision - Intermittent    Equipment Recommendations  None recommended by OT    Recommendations for Other Services       Precautions / Restrictions Precautions Precautions: Knee Precaution Comments: reviewed no pillow under knee Required Braces or Orthoses: Knee Immobilizer - Left Knee Immobilizer - Left: On when out of bed or walking;Discontinue post op day 2 Restrictions Weight Bearing Restrictions: Yes LLE Weight Bearing: Weight bearing as tolerated      Mobility Bed Mobility Overal bed mobility: Modified Independent Bed Mobility: Supine to Sit;Sit to Supine     Supine to sit: Supervision;HOB elevated Sit to supine: Supervision   General bed mobility comments: increased time, HOB flat. Use of bed rails  Transfers Overall transfer level: Needs assistance Equipment used: Rolling walker (2 wheeled) Transfers: Sit to/from Stand Sit to Stand: Supervision         General transfer comment: Supervision for safety.     Balance Overall balance assessment: Needs assistance Sitting-balance support: Feet supported;No upper extremity supported Sitting balance-Leahy Scale: Normal     Standing balance support: During functional activity;Bilateral upper extremity supported Standing balance-Leahy Scale: Poor Standing balance comment: RW to balance                             ADL Overall ADL's : Needs  assistance/impaired Eating/Feeding: Independent;Sitting   Grooming: Supervision/safety;Standing   Upper Body Bathing: Set up;Sitting   Lower Body Bathing: Set up;Supervison/ safety;Sit to/from stand   Upper Body Dressing : Set up;Sitting   Lower Body Dressing: Sit to/from stand;Supervision/safety;Set up   Toilet Transfer: Supervision/safety;Ambulation;RW   Toileting- Clothing Manipulation and Hygiene: Supervision/safety;Sit to/from stand   Tub/ Shower Transfer: Supervision/safety;Tub transfer;Ambulation;3 in 1;Rolling walker Tub/Shower Transfer Details (indicate cue type and reason): pt return demo'd safe tub transfer with 3N1 technique.  Functional mobility during ADLs: Supervision/safety;Rolling walker General ADL Comments: Pt overall at supervision/setup level and has no further ADL concerns.      Vision  Pt reports no change from baseline.                    Perception Perception Perception Tested?: No   Praxis Praxis Praxis tested?: Within functional limits    Pertinent Vitals/Pain Pain Assessment: 0-10 Pain Score: 3  Pain Location: Lt knee Pain Descriptors / Indicators: Aching Pain Intervention(s): Monitored during session;Repositioned     Hand Dominance     Extremity/Trunk Assessment Upper Extremity Assessment Upper Extremity Assessment: Overall WFL for tasks assessed   Lower Extremity Assessment Lower Extremity Assessment: Defer to PT evaluation   Cervical / Trunk Assessment Cervical / Trunk Assessment: Normal   Communication Communication Communication: No difficulties   Cognition Arousal/Alertness: Awake/alert Behavior During Therapy: WFL for tasks assessed/performed Overall Cognitive Status: Within Functional Limits for tasks assessed  Home Living Family/patient expects to be discharged to:: Private residence Living Arrangements: Non-relatives/Friends Available Help at Discharge:  Friend(s);Available 24 hours/day Type of Home: Apartment Home Access: Stairs to enter Entrance Stairs-Number of Steps: flight Entrance Stairs-Rails: Right Home Layout: One level     Bathroom Shower/Tub: Tub/shower unit Shower/tub characteristics: Architectural technologist: Standard     Home Equipment: None          Prior Functioning/Environment Level of Independence: Independent             OT Diagnosis: Generalized weakness;Acute pain    End of Session Equipment Utilized During Treatment: Gait belt;Rolling walker  Activity Tolerance: Patient tolerated treatment well Patient left: in bed;with call bell/phone within reach   Time: 1654-1716 OT Time Calculation (min): 22 min Charges:  OT General Charges $OT Visit: 1 Procedure OT Evaluation $Initial OT Evaluation Tier I: 1 Procedure OT Treatments $Self Care/Home Management : 8-22 mins G-Codes:    Juluis Rainier Sep 04, 2014, 6:49 PM  Secundino Ginger Lynetta Mare, OTR/L Occupational Therapist [phone] (pager)

## 2014-08-18 NOTE — Progress Notes (Signed)
CARE MANAGEMENT NOTE 08/18/2014  Patient:  RORIE, DELMORE   Account Number:  [id number]  Date Initiated:  08/17/2014  Documentation initiated by:  [facility]  Subjective/Objective Assessment:   s/p left uni knee arthroplasty     Action/Plan:   PT/OT evals-recommended HHPT   Anticipated DC Date:  08/19/2014   Anticipated DC Plan:  [place]  CM consult      [facility] Choice  [place]   Choice offered to / List presented to:  C-1 Patient   DME arranged  3-N-1  [place]      DME agency  [employer]     HH arranged  HH-2 PT      [place].   Status of service:  Completed, signed off Medicare Important Message given?   (If response is "NO", the following Medicare IM given date fields will be blank) Date Medicare IM given:   Medicare IM given by:   Date Additional Medicare IM given:   Additional Medicare IM given by:    Discharge Disposition:  [place]  Per UR Regulation:  Reviewed for med. necessity/level of care/duration of stay  If discussed at [place] of Stay Meetings, dates discussed:    Comments:  08/17/14 Spoke with patient about [place]. She chose [facility]. Contacted Miranda with [facility] and set up HHPT. Patient has a rolling walker and  3N1 from [employer] in her room. Will continue to follow for d/c needs. Fuller Plan RN, BSN, CCM

## 2014-08-18 NOTE — Discharge Instructions (Signed)
Diet: As you were doing prior to hospitalization   Shower:  May shower but keep the wounds dry, use an occlusive plastic wrap, NO SOAKING IN TUB.  If the bandage gets wet, change with a clean dry gauze.  Dressing:  You may change your dressing 3-5 days after surgery.  Then change the dressing daily with sterile gauze dressing.    There are sticky tapes (steri-strips) on your wounds and all the stitches are absorbable.  Leave the steri-strips in place when changing your dressings, they will peel off with time, usually 2-3 weeks.  Activity:  Increase activity slowly as tolerated, but follow the weight bearing instructions below.  No lifting or driving for 6 weeks.  Weight Bearing:   As tolerated.    To prevent constipation: you may use a stool softener such as -  Colace (over the counter) 100 mg by mouth twice a day  Drink plenty of fluids (prune juice may be helpful) and high fiber foods Miralax (over the counter) for constipation as needed.    Itching:  If you experience itching with your medications, try taking only a single pain pill, or even half a pain pill at a time.  You may take up to 10 pain pills per day, and you can also use benadryl over the counter for itching or also to help with sleep.   Precautions:  If you experience chest pain or shortness of breath - call 911 immediately for transfer to the hospital emergency department!!  If you develop a fever greater that 101 F, purulent drainage from wound, increased redness or drainage from wound, or calf pain -- Call the office at [phone]                                                Follow- Up Appointment:  Please call for an appointment to be seen in 2 weeks [place] - [phone]  Information on my medicine - XARELTO (Rivaroxaban)  This medication education was reviewed with me or my healthcare representative as part of my discharge preparation.  The pharmacist that spoke with me during my hospital stay was:  Tad Moore, [facility]  Why was Xarelto prescribed for you? Xarelto was prescribed for you to reduce the risk of blood clots forming after orthopedic surgery. The medical term for these abnormal blood clots is venous thromboembolism (VTE).  What do you need to know about xarelto ? Take your Xarelto ONCE DAILY at the same time every day. You may take it either with or without food.  If you have difficulty swallowing the tablet whole, you may crush it and mix in applesauce just prior to taking your dose.  Take Xarelto exactly as prescribed by your doctor and DO NOT stop taking Xarelto without talking to the doctor who prescribed the medication.  Stopping without other VTE prevention medication to take the place of Xarelto may increase your risk of developing a clot.  After discharge, you should have regular check-up appointments with your healthcare provider that is prescribing your Xarelto.    What do you do if you miss a dose? If you miss a dose, take it as soon as you remember on the same day then continue your regularly scheduled once daily regimen the next day. Do not take two doses of Xarelto on the same day.   Important Safety  Information A possible side effect of Xarelto is bleeding. You should call your healthcare provider right away if you experience any of the following: ? Bleeding from an injury or your nose that does not stop. ? Unusual colored urine (red or dark brown) or unusual colored stools (red or black). ? Unusual bruising for unknown reasons. ? A serious fall or if you hit your head (even if there is no bleeding).  Some medicines may interact with Xarelto and might increase your risk of bleeding while on Xarelto. To help avoid this, consult your healthcare provider or pharmacist prior to using any new prescription or non-prescription medications, including herbals, vitamins, non-steroidal anti-inflammatory drugs (NSAIDs) and supplements.  This website has more  information on Xarelto: [email or web address]

## 2014-08-19 LAB — GLUCOSE, CAPILLARY: Glucose-Capillary: 114 mg/dL — ABNORMAL HIGH (ref 70–99)

## 2014-08-19 LAB — CBC
HCT: 33.8 % — ABNORMAL LOW (ref 36.0–46.0)
Hemoglobin: 10.9 g/dL — ABNORMAL LOW (ref 12.0–15.0)
MCH: 28.9 pg (ref 26.0–34.0)
MCHC: 32.2 g/dL (ref 30.0–36.0)
MCV: 89.7 fL (ref 78.0–100.0)
PLATELETS: 224 10*3/uL (ref 150–400)
RBC: 3.77 MIL/uL — ABNORMAL LOW (ref 3.87–5.11)
RDW: 13.4 % (ref 11.5–15.5)
WBC: 10 10*3/uL (ref 4.0–10.5)

## 2014-08-19 NOTE — Progress Notes (Signed)
Physical Therapy Treatment Patient Details Name: [name] MRN: [id number] DOB: 15-Jul-1951 Today's Date: 08/19/2014    History of Present Illness Pt admitted for left unicompartmental knee    PT Comments    Pt very motivated and progressing well. Able to ambulate at supervision level with stairs also. Pt was vomiting and then reported she felt better after vomiting. RN made aware. Pt able to perform HEP without (A). Pt safe from mobility standpoint to D/C home.   Follow Up Recommendations  Home health PT     Equipment Recommendations  Rolling walker with 5" wheels;3in1 (PT)    Recommendations for Other Services       Precautions / Restrictions Precautions Precautions: Knee Precaution Comments: reviewed no pillow under knee Knee Immobilizer - Left: Other (comment) (D/c pt performing SLR) Restrictions Weight Bearing Restrictions: Yes LLE Weight Bearing: Weight bearing as tolerated    Mobility  Bed Mobility Overal bed mobility: Modified Independent Bed Mobility: Sit to Supine       Sit to supine: Modified independent (Device/Increase time)   General bed mobility comments: pt able to bring Lt LE into supine position   Transfers Overall transfer level: Needs assistance Equipment used: Rolling walker (2 wheeled) Transfers: Sit to/from Stand Sit to Stand: Supervision         General transfer comment: cues for RW positioning   Ambulation/Gait Ambulation/Gait assistance: Supervision Ambulation Distance (Feet): 200 Feet Assistive device: Rolling walker (2 wheeled) Gait Pattern/deviations: Step-through pattern;Decreased stance time - left;Decreased step length - right;Antalgic;Trunk flexed Gait velocity: decreased Gait velocity interpretation: Below normal speed for age/gender General Gait Details: required sitting rest break and vomited during session; cues for step through gt sequencing and upright posture; did not wear KI and no buckling noted     Stairs Stairs: Yes Stairs assistance: Supervision Stair Management: Two rails;Step to pattern;Forwards Number of Stairs: 3 General stair comments: pt able to recall proper sequencing; supervision for safety and RW managemetn   Wheelchair Mobility    Modified Rankin (Stroke Patients Only)       Balance Overall balance assessment: No apparent balance deficits (not formally assessed)                                  Cognition Arousal/Alertness: Awake/alert Behavior During Therapy: WFL for tasks assessed/performed Overall Cognitive Status: Within Functional Limits for tasks assessed                      Exercises Total Joint Exercises Ankle Circles/Pumps: AROM;Both;10 reps;Supine Quad Sets: AROM;Strengthening;Left;10 reps;Seated Towel Squeeze: Both;10 reps Heel Slides: AAROM;Left;10 reps;Seated Hip ABduction/ADduction: AROM;Strengthening;Left;10 reps;Seated Straight Leg Raises: AROM;Left;Supine;5 reps Goniometric ROM: 0 to 50 degreees in supine     General Comments General comments (skin integrity, edema, etc.): discussed car transfer technique ; reviewed HEP      Pertinent Vitals/Pain Pain Assessment: 0-10 Pain Score: 4  Pain Location: Lt knee  Pain Descriptors / Indicators:  ("stiffness') Pain Intervention(s): Premedicated before session;Monitored during session;Patient requesting pain meds-RN notified;Ice applied;Repositioned    Home Living                      Prior Function            PT Goals (current goals can now be found in the care plan section) Acute Rehab PT Goals Patient Stated Goal: home today PT Goal Formulation: With patient Time For  Goal Achievement: 08/24/14 Potential to Achieve Goals: Good Progress towards PT goals: Progressing toward goals    Frequency  7X/week    PT Plan Current plan remains appropriate    Co-evaluation             End of Session Equipment Utilized During Treatment: Gait  belt;Left knee immobilizer Activity Tolerance: Patient tolerated treatment well Patient left: in bed;with call bell/phone within reach     Time: 0805-0829 PT Time Calculation (min) (ACUTE ONLY): 24 min  Charges:  $Gait Training: 8-22 mins $Therapeutic Exercise: 8-22 mins                    G CodesGustavus Bryant, [place] 08/19/2014, 10:27 AM

## 2014-08-19 NOTE — Progress Notes (Signed)
[name] to be D/C'd Home per MD order. Discussed with the patient and all questions fully answered.    Medication List    STOP taking these medications        aspirin 81 MG tablet     ibuprofen 200 MG tablet  Commonly known as:  ADVIL,MOTRIN     ibuprofen 800 MG tablet  Commonly known as:  ADVIL,MOTRIN     oxyCODONE-acetaminophen 5-325 MG per tablet  Commonly known as:  ROXICET  Replaced by:  oxyCODONE-acetaminophen 10-325 MG per tablet      TAKE these medications        ADVAIR DISKUS 250-50 MCG/DOSE Aepb  Generic drug:  Fluticasone-Salmeterol  Inhale 1 puff into the lungs 2 (two) times daily as needed (for shortness of breath).     amLODipine 10 MG tablet  Commonly known as:  NORVASC  Take 10 mg by mouth every evening.     baclofen 10 MG tablet  Commonly known as:  LIORESAL  Take 1 tablet (10 mg total) by mouth 3 (three) times daily. As needed for muscle spasm     docusate sodium 100 MG capsule  Commonly known as:  COLACE  Take 100 mg by mouth 2 (two) times daily as needed for mild constipation.     EPIPEN 2-PAK 0.3 mg/0.3 mL Soaj injection  Generic drug:  EPINEPHrine  Inject 0.3 mg into the muscle once.     glucagon 1 MG injection  Commonly known as:  GLUCAGON EMERGENCY  Inject 1 mg into the vein once as needed.     glucose blood test strip  Commonly known as:  BAYER CONTOUR NEXT TEST  Use as instructed to check blood sugar 5 times per day dx code 250.02     insulin aspart 100 UNIT/ML injection  Commonly known as:  novoLOG  Use max 60 units per day with insulin pump     losartan-hydrochlorothiazide 100-25 MG per tablet  Commonly known as:  HYZAAR  Take 2 tablets by mouth daily.     ondansetron 4 MG tablet  Commonly known as:  ZOFRAN  Take 1 tablet (4 mg total) by mouth every 8 (eight) hours as needed for nausea or vomiting.     oxyCODONE-acetaminophen 10-325 MG per tablet  Commonly known as:  PERCOCET  Take 1-2 tablets by mouth every 6  (six) hours as needed for pain. MAXIMUM TOTAL ACETAMINOPHEN DOSE IS 4000 MG PER DAY     pantoprazole 40 MG tablet  Commonly known as:  PROTONIX  Take 40 mg by mouth every evening.     pravastatin 40 MG tablet  Commonly known as:  PRAVACHOL  Take 1 tablet (40 mg total) by mouth every morning.     rivaroxaban 10 MG Tabs tablet  Commonly known as:  XARELTO  Take 1 tablet (10 mg total) by mouth daily.     sennosides-docusate sodium 8.6-50 MG tablet  Commonly known as:  SENOKOT-S  Take 2 tablets by mouth daily.     solifenacin 5 MG tablet  Commonly known as:  VESICARE  Take 5 mg by mouth every morning.     spironolactone 50 MG tablet  Commonly known as:  ALDACTONE  Take 1 tablet (50 mg total) by mouth daily.     trimethoprim 100 MG tablet  Commonly known as:  TRIMPEX  Take 100 mg by mouth daily.     VICTOZA 18 MG/3ML Soln injection  Generic drug:  Liraglutide  Inject 1.2 mg into  the skin every morning.        VVS, Skin clean, dry and intact without evidence of skin break down, no evidence of skin tears noted.  IV catheter discontinued intact. Site without signs and symptoms of complications. Dressing and pressure applied.  An After Visit Summary was printed and given to the patient.  Patient escorted via [place], and D/C home via private auto.  Cyndra Numbers  08/19/2014 12:45 PM

## 2014-08-19 NOTE — Discharge Summary (Signed)
Physician Discharge Summary  Patient ID: [name] MRN: [id number] DOB/AGE: 10-31-1950 63 y.o.  Admit date: 08/17/2014 Discharge date: 08/19/2014  Admission Diagnoses:  Osteoarthritis of left knee  Discharge Diagnoses:  Principal Problem:   Osteoarthritis of left knee, primary localized Active Problems:   Knee osteoarthritis   Past Medical History  Diagnosis Date  . Hypertension   . Fibromyalgia   . Hyperlipidemia   . SUI (stress urinary incontinence, female)   . Diabetes mellitus type 2, insulin dependent   . Insulin pump in place     since 12/ 2014--  [facility]  . GERD (gastroesophageal reflux disease)   . H/O hiatal hernia   . History of esophageal dilatation   . Renal insufficiency   . History of rhabdomyolysis     03/ 2015  . Asthma   . Wears glasses   . Full dentures   . Headache     h/o migraines - was followed for a time with a wellness doctor  . Arthritis     L knee, hands, back   . Osteoarthritis of left knee, primary localized 08/17/2014    Surgeries: Procedure(s): LEFT UNICOMPARTMENTAL KNEE on 08/17/2014   Consultants (if any):    Discharged Condition: Improved  Hospital Course: [name] is an 63 y.o. female who was admitted 08/17/2014 with a diagnosis of Osteoarthritis of left knee and went to the operating room on 08/17/2014 and underwent the above named procedures.    She was given perioperative antibiotics:  Anti-infectives    Start     Dose/Rate Route Frequency Ordered Stop   08/17/14 1400  ceFAZolin (ANCEF) IVPB 2 g/50 mL premix     2 g100 mL/hr over 30 Minutes Intravenous Every 6 hours 08/17/14 1044 08/17/14 2203   08/17/14 1200  trimethoprim (TRIMPEX) tablet 100 mg     100 mg Oral Daily 08/17/14 1044     08/17/14 0600  ceFAZolin (ANCEF) IVPB 2 g/50 mL premix     2 g100 mL/hr over 30 Minutes Intravenous On call to O.R. 08/16/14 1349 08/17/14 0743    .  She was given sequential compression devices, early  ambulation, and xarelto for DVT prophylaxis.  She benefited maximally from the hospital stay and there were no complications.    Recent vital signs:  Filed Vitals:   08/19/14 0619  BP: 136/72  Pulse: 94  Temp: 98.4 F (36.9 C)  Resp: 16    Recent laboratory studies:  Lab Results  Component Value Date   HGB 10.9* 08/19/2014   HGB 12.2 08/18/2014   HGB 13.8 08/06/2014   Lab Results  Component Value Date   WBC 10.0 08/19/2014   PLT 224 08/19/2014   Lab Results  Component Value Date   INR 1.07 08/06/2014   Lab Results  Component Value Date   NA 133* 08/18/2014   K 3.8 08/18/2014   CL 97 08/18/2014   CO2 25 08/18/2014   BUN 15 08/18/2014   CREATININE 1.35* 08/18/2014   GLUCOSE 102* 08/18/2014    Discharge Medications:     Medication List    STOP taking these medications        aspirin 81 MG tablet     ibuprofen 200 MG tablet  Commonly known as:  ADVIL,MOTRIN     ibuprofen 800 MG tablet  Commonly known as:  ADVIL,MOTRIN     oxyCODONE-acetaminophen 5-325 MG per tablet  Commonly known as:  ROXICET  Replaced by:  oxyCODONE-acetaminophen 10-325 MG per tablet  TAKE these medications        ADVAIR DISKUS 250-50 MCG/DOSE Aepb  Generic drug:  Fluticasone-Salmeterol  Inhale 1 puff into the lungs 2 (two) times daily as needed (for shortness of breath).     amLODipine 10 MG tablet  Commonly known as:  NORVASC  Take 10 mg by mouth every evening.     baclofen 10 MG tablet  Commonly known as:  LIORESAL  Take 1 tablet (10 mg total) by mouth 3 (three) times daily. As needed for muscle spasm     docusate sodium 100 MG capsule  Commonly known as:  COLACE  Take 100 mg by mouth 2 (two) times daily as needed for mild constipation.     EPIPEN 2-PAK 0.3 mg/0.3 mL Soaj injection  Generic drug:  EPINEPHrine  Inject 0.3 mg into the muscle once.     glucagon 1 MG injection  Commonly known as:  GLUCAGON EMERGENCY  Inject 1 mg into the vein once as needed.      glucose blood test strip  Commonly known as:  BAYER CONTOUR NEXT TEST  Use as instructed to check blood sugar 5 times per day dx code 250.02     insulin aspart 100 UNIT/ML injection  Commonly known as:  novoLOG  Use max 60 units per day with insulin pump     losartan-hydrochlorothiazide 100-25 MG per tablet  Commonly known as:  HYZAAR  Take 2 tablets by mouth daily.     ondansetron 4 MG tablet  Commonly known as:  ZOFRAN  Take 1 tablet (4 mg total) by mouth every 8 (eight) hours as needed for nausea or vomiting.     oxyCODONE-acetaminophen 10-325 MG per tablet  Commonly known as:  PERCOCET  Take 1-2 tablets by mouth every 6 (six) hours as needed for pain. MAXIMUM TOTAL ACETAMINOPHEN DOSE IS 4000 MG PER DAY     pantoprazole 40 MG tablet  Commonly known as:  PROTONIX  Take 40 mg by mouth every evening.     pravastatin 40 MG tablet  Commonly known as:  PRAVACHOL  Take 1 tablet (40 mg total) by mouth every morning.     rivaroxaban 10 MG Tabs tablet  Commonly known as:  XARELTO  Take 1 tablet (10 mg total) by mouth daily.     sennosides-docusate sodium 8.6-50 MG tablet  Commonly known as:  SENOKOT-S  Take 2 tablets by mouth daily.     solifenacin 5 MG tablet  Commonly known as:  VESICARE  Take 5 mg by mouth every morning.     spironolactone 50 MG tablet  Commonly known as:  ALDACTONE  Take 1 tablet (50 mg total) by mouth daily.     trimethoprim 100 MG tablet  Commonly known as:  TRIMPEX  Take 100 mg by mouth daily.     VICTOZA 18 MG/3ML Soln injection  Generic drug:  Liraglutide  Inject 1.2 mg into the skin every morning.        Diagnostic Studies: Dg Knee Left Port  08/17/2014   CLINICAL DATA:  63 year old status post unicompartmental knee replacement.  EXAM: PORTABLE LEFT KNEE - 1-2 VIEW  COMPARISON:  Correlation with MRI from 06/26/2014  FINDINGS: A medial joint compartment knee replacement is present. The medial femoral condyle and medial tibial plateau  components are well seated and approximated. No abnormal lucencies are seen about the hardware. Subcutaneous emphysema is present along the anterior knee, compatible with recent surgery. Soft tissue edema is present.  IMPRESSION: Radiographically  intact unicompartmental left knee replacement.   Electronically Signed   By: Rosemarie Ax   On: 08/17/2014 10:11    Disposition: 01-Home or Self Care      Discharge Instructions    Weight bearing as tolerated    Complete by:  As directed            Follow-up Information    Follow up with Johnny Bridge, MD. Schedule an appointment as soon as possible for a visit in 2 weeks.   Specialty:  Orthopedic Surgery   Contact information:   [place] [address] [phone]       Follow up with [facility].   Why:  They will contact you to schedule home physical therapy visits.   Contact information:   [address] [place] [address] [phone]        Signed: Johnny Bridge 08/19/2014, 8:23 AM

## 2014-08-24 ENCOUNTER — Telehealth: Payer: Self-pay | Admitting: Endocrinology

## 2014-08-25 NOTE — Telephone Encounter (Signed)
error 

## 2014-08-27 DIAGNOSIS — I10 Essential (primary) hypertension: Secondary | ICD-10-CM | POA: Diagnosis not present

## 2014-08-27 DIAGNOSIS — Z471 Aftercare following joint replacement surgery: Secondary | ICD-10-CM | POA: Diagnosis not present

## 2014-08-27 DIAGNOSIS — E119 Type 2 diabetes mellitus without complications: Secondary | ICD-10-CM | POA: Diagnosis not present

## 2014-08-27 DIAGNOSIS — Z794 Long term (current) use of insulin: Secondary | ICD-10-CM | POA: Diagnosis not present

## 2014-08-27 DIAGNOSIS — Z96652 Presence of left artificial knee joint: Secondary | ICD-10-CM | POA: Diagnosis not present

## 2014-08-27 DIAGNOSIS — Z7901 Long term (current) use of anticoagulants: Secondary | ICD-10-CM | POA: Diagnosis not present

## 2014-08-31 DIAGNOSIS — Z794 Long term (current) use of insulin: Secondary | ICD-10-CM | POA: Diagnosis not present

## 2014-08-31 DIAGNOSIS — Z471 Aftercare following joint replacement surgery: Secondary | ICD-10-CM | POA: Diagnosis not present

## 2014-08-31 DIAGNOSIS — Z7901 Long term (current) use of anticoagulants: Secondary | ICD-10-CM | POA: Diagnosis not present

## 2014-08-31 DIAGNOSIS — I10 Essential (primary) hypertension: Secondary | ICD-10-CM | POA: Diagnosis not present

## 2014-08-31 DIAGNOSIS — E119 Type 2 diabetes mellitus without complications: Secondary | ICD-10-CM | POA: Diagnosis not present

## 2014-08-31 DIAGNOSIS — Z96652 Presence of left artificial knee joint: Secondary | ICD-10-CM | POA: Diagnosis not present

## 2014-09-02 DIAGNOSIS — M25662 Stiffness of left knee, not elsewhere classified: Secondary | ICD-10-CM | POA: Diagnosis not present

## 2014-09-02 DIAGNOSIS — M25562 Pain in left knee: Secondary | ICD-10-CM | POA: Diagnosis not present

## 2014-09-02 DIAGNOSIS — M1712 Unilateral primary osteoarthritis, left knee: Secondary | ICD-10-CM | POA: Diagnosis not present

## 2014-09-06 DIAGNOSIS — M25662 Stiffness of left knee, not elsewhere classified: Secondary | ICD-10-CM | POA: Diagnosis not present

## 2014-09-06 DIAGNOSIS — M25562 Pain in left knee: Secondary | ICD-10-CM | POA: Diagnosis not present

## 2014-09-06 DIAGNOSIS — M1712 Unilateral primary osteoarthritis, left knee: Secondary | ICD-10-CM | POA: Diagnosis not present

## 2014-09-08 DIAGNOSIS — M1712 Unilateral primary osteoarthritis, left knee: Secondary | ICD-10-CM | POA: Diagnosis not present

## 2014-09-08 DIAGNOSIS — M25562 Pain in left knee: Secondary | ICD-10-CM | POA: Diagnosis not present

## 2014-09-08 DIAGNOSIS — M25662 Stiffness of left knee, not elsewhere classified: Secondary | ICD-10-CM | POA: Diagnosis not present

## 2014-09-13 DIAGNOSIS — M1712 Unilateral primary osteoarthritis, left knee: Secondary | ICD-10-CM | POA: Diagnosis not present

## 2014-09-13 DIAGNOSIS — M25662 Stiffness of left knee, not elsewhere classified: Secondary | ICD-10-CM | POA: Diagnosis not present

## 2014-09-13 DIAGNOSIS — M25562 Pain in left knee: Secondary | ICD-10-CM | POA: Diagnosis not present

## 2014-09-14 DIAGNOSIS — E1165 Type 2 diabetes mellitus with hyperglycemia: Secondary | ICD-10-CM | POA: Diagnosis not present

## 2014-09-15 DIAGNOSIS — M25562 Pain in left knee: Secondary | ICD-10-CM | POA: Diagnosis not present

## 2014-09-15 DIAGNOSIS — M25662 Stiffness of left knee, not elsewhere classified: Secondary | ICD-10-CM | POA: Diagnosis not present

## 2014-09-15 DIAGNOSIS — M1712 Unilateral primary osteoarthritis, left knee: Secondary | ICD-10-CM | POA: Diagnosis not present

## 2014-09-16 ENCOUNTER — Other Ambulatory Visit: Payer: Self-pay | Admitting: *Deleted

## 2014-09-16 ENCOUNTER — Telehealth: Payer: Self-pay | Admitting: Endocrinology

## 2014-09-16 MED ORDER — INSULIN ASPART 100 UNIT/ML ~~LOC~~ SOLN: SUBCUTANEOUS | Status: DC

## 2014-09-16 NOTE — Telephone Encounter (Signed)
Please read note below and advise.  

## 2014-09-16 NOTE — Telephone Encounter (Signed)
ok 

## 2014-09-16 NOTE — Telephone Encounter (Signed)
Patient need new prescription for Humalog

## 2014-09-24 DIAGNOSIS — M25562 Pain in left knee: Secondary | ICD-10-CM | POA: Diagnosis not present

## 2014-09-24 DIAGNOSIS — M25662 Stiffness of left knee, not elsewhere classified: Secondary | ICD-10-CM | POA: Diagnosis not present

## 2014-09-24 DIAGNOSIS — M1712 Unilateral primary osteoarthritis, left knee: Secondary | ICD-10-CM | POA: Diagnosis not present

## 2014-09-27 ENCOUNTER — Other Ambulatory Visit: Payer: PRIVATE HEALTH INSURANCE

## 2014-09-27 DIAGNOSIS — M1712 Unilateral primary osteoarthritis, left knee: Secondary | ICD-10-CM | POA: Diagnosis not present

## 2014-09-27 DIAGNOSIS — M25662 Stiffness of left knee, not elsewhere classified: Secondary | ICD-10-CM | POA: Diagnosis not present

## 2014-09-27 DIAGNOSIS — M25562 Pain in left knee: Secondary | ICD-10-CM | POA: Diagnosis not present

## 2014-09-29 DIAGNOSIS — M25562 Pain in left knee: Secondary | ICD-10-CM | POA: Diagnosis not present

## 2014-09-29 DIAGNOSIS — M1712 Unilateral primary osteoarthritis, left knee: Secondary | ICD-10-CM | POA: Diagnosis not present

## 2014-09-29 DIAGNOSIS — M25662 Stiffness of left knee, not elsewhere classified: Secondary | ICD-10-CM | POA: Diagnosis not present

## 2014-09-30 ENCOUNTER — Other Ambulatory Visit: Payer: Self-pay | Admitting: *Deleted

## 2014-09-30 ENCOUNTER — Ambulatory Visit (INDEPENDENT_AMBULATORY_CARE_PROVIDER_SITE_OTHER): Payer: Medicare Other | Admitting: Endocrinology

## 2014-09-30 ENCOUNTER — Encounter: Payer: Self-pay | Admitting: Endocrinology

## 2014-09-30 VITALS — BP 170/88 | HR 86 | Temp 98.2°F | Resp 16 | Ht 64.0 in | Wt 176.0 lb

## 2014-09-30 DIAGNOSIS — I10 Essential (primary) hypertension: Secondary | ICD-10-CM

## 2014-09-30 DIAGNOSIS — N289 Disorder of kidney and ureter, unspecified: Secondary | ICD-10-CM

## 2014-09-30 DIAGNOSIS — E7800 Pure hypercholesterolemia, unspecified: Secondary | ICD-10-CM

## 2014-09-30 DIAGNOSIS — IMO0002 Reserved for concepts with insufficient information to code with codable children: Secondary | ICD-10-CM

## 2014-09-30 DIAGNOSIS — E1165 Type 2 diabetes mellitus with hyperglycemia: Secondary | ICD-10-CM

## 2014-09-30 LAB — HEMOGLOBIN A1C: Hgb A1c MFr Bld: 7.7 % — ABNORMAL HIGH (ref 4.6–6.5)

## 2014-09-30 LAB — BASIC METABOLIC PANEL
BUN: 19 mg/dL (ref 6–23)
CHLORIDE: 102 meq/L (ref 96–112)
CO2: 30 mEq/L (ref 19–32)
Calcium: 9.7 mg/dL (ref 8.4–10.5)
Creatinine, Ser: 1.49 mg/dL — ABNORMAL HIGH (ref 0.40–1.20)
GFR: 45.57 mL/min — ABNORMAL LOW (ref >60.00)
Glucose, Bld: 57 mg/dL — ABNORMAL LOW (ref 70–99)
POTASSIUM: 4 meq/L (ref 3.5–5.1)
SODIUM: 138 meq/L (ref 135–145)

## 2014-09-30 MED ORDER — LIRAGLUTIDE 18 MG/3ML ~~LOC~~ SOPN: PEN_INJECTOR | SUBCUTANEOUS | Status: DC

## 2014-09-30 MED ORDER — INSULIN LISPRO 100 UNIT/ML ~~LOC~~ SOLN: SUBCUTANEOUS | Status: DC

## 2014-09-30 MED ORDER — GLUCAGON (RDNA) 1 MG IJ KIT: 1.0000 mg | PACK | Freq: Once | INTRAMUSCULAR | Status: DC | PRN

## 2014-09-30 NOTE — Progress Notes (Signed)
Patient ID: [name], female   DOB: [birth year], 64 y.o.   MRN: [id number]   Reason for Appointment: Diabetes follow-up   History of Present Illness   Diagnosis: Type 2, date of diagnosis:1985.    PAST history: She has had difficult to control diabetes requiring large doses of insulin especially at meal times. Because of poor control she was switched from NovoLog to U-500 insulin in 11/2012. At that time her A1c was 9%  Initially seemed to have better blood sugars. With no clear response with Invokana this was stopped and she was started on Actos. Her insulin was adjusted with higher mealtime coverage at breakfast and lunch and lower amounts at supper. Lantus requirement appeared to be more in the morning and evening dose was eventually stopped. She was on Victoza and Invokana later in an attempt to improve her sugars but with only limited success. Because of continued poor control with subcutaneous insulin and large insulin requirement she was switched to a [employer] insulin pump which she started on 07/29/13 Prior to starting insulin pump her blood sugar at home was averaging 273 With the U 500 insulin in the pump her blood sugars were dramatically better with rare hypoglycemia  She was switched to NovoLog insulin in her pump instead of U-500 insulin in 3/15 because of  episode of severe overnight hypoglycemia  RECENT history:  Because of inadequate control with her insulin pump her basal rates were increased in 12/15 at 7 AM, 11 AM and 4 PM. With this her fasting blood sugars appear to be improved but she still has tendency to high readings towards the evening Her compliance with her pump management has been still suboptimal since she is not taking consistent boluses for meals and high sugars although is doing somewhat better.  Current blood sugar patterns and problems:  Has occasionally done only 1 are no boluses and a given day although on average is now doing 2.1 boluses a  day  She is not consistent with her answers but may not be bolusing consistently for lunch meal as she sometimes has readings over 200 around supper time.  Has also occasional high readings late at night probably from missed boluses  Her blood sugars fasting are variable but significantly better in the last 3-4 days and on average better than previously.  Blood sugars after meals are fairly good when she checks them.  She may tend to be getting low normal or slightly low after taking corrections for high readings at times  She is trying not to skip boluses at meal times even when the blood sugar is near-normal  She has lost a significant amount of weight since her last visit and she is able to take care of herself better and start an exercise regimen with physical therapy recently  Overall average blood sugar is high at 166, similar to her previous visit on the pump download  PRE-MEAL Breakfast Lunch Dinner Bedtime Overall  Glucose range:  116-207   146-230   136-239   94-156    Mean/median: 168   173 166    Pump settings  Basal rate midnight = 0.7, 3 AM = 0.6, 7 AM = 2.2, 11 AM = 3.9, 4 PM = 2.0 and 9 PM = 3.0, total 52 units Carbohydrate coverage 1:5, correction 1: 30 and target 110-120, insulin duration 4 hours  Bolus events: 2.1 per day, 93 % with correction BASAL total amount = 52.5 units, total insulin 66 units on average, bolus  percent use 24 %  Meals: 2-3 meals per day, at 11 am, 3 pm and 7 pm Food preferences: She has oatmeal or cereal and juice,  toast  occasionally with egg, usually has a sandwich and salad for lunch and vegetables for dinner with fish or lean meat for protein. Will snack on apples, raisons or wheat thins.   Wt Readings from Last 3 Encounters:  09/30/14 176 lb (79.833 kg)  08/17/14 191 lb (86.637 kg)  07/28/14 192 lb (87.091 kg)   Physical activity: exercise: Therapy Dietician visit: Most recent: 2012  Retinal exam: Most recent: 2010.   Complications:  No retinopathy or known nephropathy.    Lab Results  Component Value Date   HGBA1C 8.8* 05/20/2014   HGBA1C 7.9* 01/12/2014   HGBA1C 7.2* 10/09/2013   Lab Results  Component Value Date   MICROALBUR 1.1 07/28/2014   LDLCALC 116* 07/20/2014   CREATININE 1.35* 08/18/2014       Medication List       This list is accurate as of: 09/30/14  2:10 PM.  Always use your most recent med list.               ADVAIR DISKUS 250-50 MCG/DOSE Aepb  Generic drug:  Fluticasone-Salmeterol  Inhale 1 puff into the lungs 2 (two) times daily as needed (for shortness of breath).     amLODipine 10 MG tablet  Commonly known as:  NORVASC  Take 10 mg by mouth every evening.     baclofen 10 MG tablet  Commonly known as:  LIORESAL  Take 1 tablet (10 mg total) by mouth 3 (three) times daily. As needed for muscle spasm     docusate sodium 100 MG capsule  Commonly known as:  COLACE  Take 100 mg by mouth 2 (two) times daily as needed for mild constipation.     EPIPEN 2-PAK 0.3 mg/0.3 mL Soaj injection  Generic drug:  EPINEPHrine  Inject 0.3 mg into the muscle once.     glucagon 1 MG injection  Commonly known as:  GLUCAGON EMERGENCY  Inject 1 mg into the vein once as needed (for low blood sugar).     glucose blood test strip  Commonly known as:  BAYER CONTOUR NEXT TEST  Use as instructed to check blood sugar 5 times per day dx code 250.02     insulin lispro 100 UNIT/ML injection  Commonly known as:  HUMALOG  Use max 60 units per day with insulin pump     Liraglutide 18 MG/3ML Sopn  Commonly known as:  VICTOZA  Inject 1.2 mg daily     losartan-hydrochlorothiazide 100-25 MG per tablet  Commonly known as:  HYZAAR  Take 2 tablets by mouth daily.     ondansetron 4 MG tablet  Commonly known as:  ZOFRAN  Take 1 tablet (4 mg total) by mouth every 8 (eight) hours as needed for nausea or vomiting.     oxyCODONE-acetaminophen 10-325 MG per tablet  Commonly known as:  PERCOCET  Take 1-2 tablets  by mouth every 6 (six) hours as needed for pain. MAXIMUM TOTAL ACETAMINOPHEN DOSE IS 4000 MG PER DAY     pantoprazole 40 MG tablet  Commonly known as:  PROTONIX  Take 40 mg by mouth every evening.     pravastatin 40 MG tablet  Commonly known as:  PRAVACHOL  Take 1 tablet (40 mg total) by mouth every morning.     rivaroxaban 10 MG Tabs tablet  Commonly known as:  XARELTO  Take 1 tablet (10 mg total) by mouth daily.     sennosides-docusate sodium 8.6-50 MG tablet  Commonly known as:  SENOKOT-S  Take 2 tablets by mouth daily.     solifenacin 5 MG tablet  Commonly known as:  VESICARE  Take 5 mg by mouth every morning.     spironolactone 50 MG tablet  Commonly known as:  ALDACTONE  Take 1 tablet (50 mg total) by mouth daily.     trimethoprim 100 MG tablet  Commonly known as:  TRIMPEX  Take 100 mg by mouth daily.        Allergies:  Allergies  Allergen Reactions  . Codeine Itching and Nausea And Vomiting    Past Medical History  Diagnosis Date  . Hypertension   . Fibromyalgia   . Hyperlipidemia   . SUI (stress urinary incontinence, female)   . Diabetes mellitus type 2, insulin dependent   . Insulin pump in place     since 12/ 2014--  [facility]  . GERD (gastroesophageal reflux disease)   . H/O hiatal hernia   . History of esophageal dilatation   . Renal insufficiency   . History of rhabdomyolysis     03/ 2015  . Asthma   . Wears glasses   . Full dentures   . Headache     h/o migraines - was followed for a time with a wellness doctor  . Arthritis     L knee, hands, back   . Osteoarthritis of left knee, primary localized 08/17/2014    Past Surgical History  Procedure Laterality Date  . Carpal tunnel release Right 2000  . Knee arthroscopy Right 1989  &  2002  . Excision left upper arm mass  01-29-2014  . Cardiac catheterization  07-20-2009   DR Girard  NORMAL  LVEF/  NORMAL CORONARY AND RENAL ARTERIES  . Colonoscopy with  esophagogastroduodenoscopy (egd)  06-18-2002  . Esophagogastroduodenoscopy (egd) with esophageal dilation  06-12-2010  . Cholecystectomy  1990  . Negative sleep study  2012    PER PT  . Bladder suspension N/A 03/09/2014    Procedure: CYSTOSCOPY/SLING;  Surgeon: Reece Packer, MD;  Location: [facility];  Service: Urology;  Laterality: N/A;  . Eye surgery  2010    laser on R eye  . Partial knee arthroplasty Left 08/17/2014    Procedure: LEFT UNICOMPARTMENTAL KNEE;  Surgeon: Johnny Bridge, MD;  Location: [address];  Service: Orthopedics;  Laterality: Left;    Family History  Problem Relation Age of Onset  . Diabetes Mother   . Hypertension Mother   . Cancer Mother   . Diabetes Father   . Hypertension Father   . Diabetes Brother     Social History:  reports that she has never smoked. She has never used smokeless tobacco. She reports that she does not drink alcohol or use illicit drugs.  Review of Systems   Creatinine was worse upto 1.6 previously and improved in 12/15   Lab Results  Component Value Date   CREATININE 1.35* 08/18/2014     HYPERTENSION:  This is poorly controlled, has been followed by PCP. Blood pressure is still high at home. No recent blood pressure medication changes made Aldactone was increased  to 50 mg previously and was suggested a 100 mg dose  No history of recent pedal edema  HYPERLIPIDEMIA: Calculated LDL is not at goal with using  pravastatin prescribed by PCP  Lab Results  Component Value Date   CHOL 184 07/20/2014   HDL 43.40 07/20/2014   LDLCALC 116* 07/20/2014   LDLDIRECT 152.8 10/09/2013   TRIG 121.0 07/20/2014   CHOLHDL 4 07/20/2014     Examination:   BP 171/100 mmHg  Pulse 86  Temp(Src) 98.2 F (36.8 C)  Resp 16  Ht 5\' 4"  (1.626 m)  Wt 176 lb (79.833 kg)  BMI 30.20 kg/m2  SpO2 98%  Body mass index is 30.2 kg/(m^2).   No ankle edema  Assesment/PLAN:   Diabetes type 2, uncontrolled  See history of  present illness for detailed discussion of her current management, problems identified and blood sugar patterns Her blood sugars are overall slightly better especially in the morning with basal rate changes She is doing better with her boluses although not optimal yet A1c to be checked again She does have still some fluctuation in her blood sugars related to variable diet and activity level Currently showing no consistent trend with high blood sugars except around supper time but this may be partly related to her not bolusing consistently for the midday meal or snack Will only increase her basal rate at 4 PM for now Also to avoid potential overcorrection she can use a correction factor 1: 40 especially in the afternoon Encouraged her to continue improving her compliance with glucose monitoring including after meals and bolusing  Hypertension: This is  not well controlled with current regimen  Will need to be increasing Aldactone to 100 mg as her blood pressure is still not well controlled with 50 mg  Renal insufficiency: It has improved somewhat and will need follow-up again  Hyperlipidemia: Would recommend changing her to Lipitor for better control, LDL is still relatively high as of 11/15   Counseling time over 50% of today's 25 minute visit   Kevontae Burgoon 09/30/2014, 2:10 PM   Addendum: Labs as follows  Office Visit on 09/30/2014  Component Date Value Ref Range Status  . Hgb A1c MFr Bld 09/30/2014 7.7* 4.6 - 6.5 % Final   Glycemic Control Guidelines for People with Diabetes:Non Diabetic:  <6%Goal of Therapy: <7%Additional Action Suggested:  >8%   . Sodium 09/30/2014 138  135 - 145 mEq/L Final  . Potassium 09/30/2014 4.0  3.5 - 5.1 mEq/L Final  . Chloride 09/30/2014 102  96 - 112 mEq/L Final  . CO2 09/30/2014 30  19 - 32 mEq/L Final  . Glucose, Bld 09/30/2014 57* 70 - 99 mg/dL Final  . BUN 09/30/2014 19  6 - 23 mg/dL Final  . Creatinine, Ser 09/30/2014 1.49* 0.40 - 1.20 mg/dL Final   . Calcium 09/30/2014 9.7  8.4 - 10.5 mg/dL Final  . GFR 09/30/2014 45.57* >60.00 mL/min Final

## 2014-09-30 NOTE — Patient Instructions (Signed)
Spironolactone--100 mg daily

## 2014-10-01 NOTE — Progress Notes (Signed)
Quick Note:  Please let patient know that the A1c is better at 7.7, kidney test is still slightly abnormal ______

## 2014-10-04 DIAGNOSIS — M1712 Unilateral primary osteoarthritis, left knee: Secondary | ICD-10-CM | POA: Diagnosis not present

## 2014-10-04 DIAGNOSIS — M25562 Pain in left knee: Secondary | ICD-10-CM | POA: Diagnosis not present

## 2014-10-04 DIAGNOSIS — M25662 Stiffness of left knee, not elsewhere classified: Secondary | ICD-10-CM | POA: Diagnosis not present

## 2014-10-12 NOTE — Telephone Encounter (Signed)
error 

## 2014-10-13 DIAGNOSIS — M25662 Stiffness of left knee, not elsewhere classified: Secondary | ICD-10-CM | POA: Diagnosis not present

## 2014-10-13 DIAGNOSIS — M1712 Unilateral primary osteoarthritis, left knee: Secondary | ICD-10-CM | POA: Diagnosis not present

## 2014-10-13 DIAGNOSIS — M25562 Pain in left knee: Secondary | ICD-10-CM | POA: Diagnosis not present

## 2014-10-20 DIAGNOSIS — Z96652 Presence of left artificial knee joint: Secondary | ICD-10-CM | POA: Diagnosis not present

## 2014-11-02 ENCOUNTER — Other Ambulatory Visit: Payer: Self-pay | Admitting: *Deleted

## 2014-11-15 ENCOUNTER — Other Ambulatory Visit: Payer: Self-pay | Admitting: *Deleted

## 2014-11-15 MED ORDER — LIRAGLUTIDE 18 MG/3ML ~~LOC~~ SOPN: PEN_INJECTOR | SUBCUTANEOUS | Status: DC

## 2014-12-08 DIAGNOSIS — E1165 Type 2 diabetes mellitus with hyperglycemia: Secondary | ICD-10-CM | POA: Diagnosis not present

## 2014-12-23 ENCOUNTER — Other Ambulatory Visit: Payer: Medicaid Other

## 2014-12-29 ENCOUNTER — Ambulatory Visit: Payer: Medicaid Other | Admitting: Endocrinology

## 2015-01-18 ENCOUNTER — Other Ambulatory Visit (INDEPENDENT_AMBULATORY_CARE_PROVIDER_SITE_OTHER): Payer: Medicare Other

## 2015-01-18 DIAGNOSIS — E7800 Pure hypercholesterolemia, unspecified: Secondary | ICD-10-CM

## 2015-01-18 DIAGNOSIS — E1165 Type 2 diabetes mellitus with hyperglycemia: Secondary | ICD-10-CM | POA: Diagnosis not present

## 2015-01-18 DIAGNOSIS — IMO0002 Reserved for concepts with insufficient information to code with codable children: Secondary | ICD-10-CM

## 2015-01-18 LAB — LIPID PANEL
Cholesterol: 170 mg/dL (ref 0–200)
HDL: 45.7 mg/dL (ref >39.00)
LDL Cholesterol: 106 mg/dL — ABNORMAL HIGH (ref 0–99)
NonHDL: 124.3
TRIGLYCERIDES: 94 mg/dL (ref 0.0–149.0)
Total CHOL/HDL Ratio: 4
VLDL: 18.8 mg/dL (ref 0.0–40.0)

## 2015-01-18 LAB — COMPREHENSIVE METABOLIC PANEL
ALT: 11 U/L (ref 0–35)
AST: 17 U/L (ref 0–37)
Albumin: 3.6 g/dL (ref 3.5–5.2)
Alkaline Phosphatase: 92 U/L (ref 39–117)
BILIRUBIN TOTAL: 0.5 mg/dL (ref 0.2–1.2)
BUN: 14 mg/dL (ref 6–23)
CO2: 30 mEq/L (ref 19–32)
Calcium: 9.1 mg/dL (ref 8.4–10.5)
Chloride: 100 mEq/L (ref 96–112)
Creatinine, Ser: 1.35 mg/dL — ABNORMAL HIGH (ref 0.40–1.20)
GFR: 51.01 mL/min — ABNORMAL LOW (ref >60.00)
Glucose, Bld: 191 mg/dL — ABNORMAL HIGH (ref 70–99)
Potassium: 3.6 mEq/L (ref 3.5–5.1)
SODIUM: 137 meq/L (ref 135–145)
TOTAL PROTEIN: 6.8 g/dL (ref 6.0–8.3)

## 2015-01-18 LAB — HEMOGLOBIN A1C: HEMOGLOBIN A1C: 7.9 % — AB (ref 4.6–6.5)

## 2015-01-20 DIAGNOSIS — I10 Essential (primary) hypertension: Secondary | ICD-10-CM | POA: Diagnosis not present

## 2015-01-20 DIAGNOSIS — G933 Postviral fatigue syndrome: Secondary | ICD-10-CM | POA: Diagnosis not present

## 2015-01-20 DIAGNOSIS — E784 Other hyperlipidemia: Secondary | ICD-10-CM | POA: Diagnosis not present

## 2015-01-20 DIAGNOSIS — E669 Obesity, unspecified: Secondary | ICD-10-CM | POA: Diagnosis not present

## 2015-01-20 DIAGNOSIS — E1165 Type 2 diabetes mellitus with hyperglycemia: Secondary | ICD-10-CM | POA: Diagnosis not present

## 2015-01-21 ENCOUNTER — Encounter: Payer: Self-pay | Admitting: Endocrinology

## 2015-01-21 ENCOUNTER — Ambulatory Visit (INDEPENDENT_AMBULATORY_CARE_PROVIDER_SITE_OTHER): Payer: Medicare Other | Admitting: Endocrinology

## 2015-01-21 VITALS — BP 138/78 | HR 102 | Temp 98.0°F | Resp 16 | Ht 64.0 in | Wt 178.2 lb

## 2015-01-21 DIAGNOSIS — E7800 Pure hypercholesterolemia, unspecified: Secondary | ICD-10-CM

## 2015-01-21 DIAGNOSIS — E1165 Type 2 diabetes mellitus with hyperglycemia: Secondary | ICD-10-CM

## 2015-01-21 DIAGNOSIS — IMO0002 Reserved for concepts with insufficient information to code with codable children: Secondary | ICD-10-CM

## 2015-01-21 DIAGNOSIS — I10 Essential (primary) hypertension: Secondary | ICD-10-CM

## 2015-01-21 DIAGNOSIS — N182 Chronic kidney disease, stage 2 (mild): Secondary | ICD-10-CM

## 2015-01-21 MED ORDER — PRAVASTATIN SODIUM 80 MG PO TABS: 80.0000 mg | ORAL_TABLET | Freq: Every morning | ORAL | Status: DC

## 2015-01-21 NOTE — Patient Instructions (Signed)
Change pump time and bring in 2 weeks  Pravastatin 80mg 

## 2015-01-21 NOTE — Progress Notes (Signed)
Patient ID: [name], female   DOB: Nov 14, 1950, 64 y.o.   MRN: [id number]   Reason for Appointment: Diabetes follow-up   History of Present Illness   Diagnosis: Type 2, date of diagnosis:1985.    PAST history: She has had difficult to control diabetes requiring large doses of insulin especially at meal times. Because of poor control she was switched from NovoLog to U-500 insulin in 11/2012. At that time her A1c was 9%  Initially seemed to have better blood sugars. With no clear response with Invokana this was stopped and she was started on Actos. Her insulin was adjusted with higher mealtime coverage at breakfast and lunch and lower amounts at supper. Lantus requirement appeared to be more in the morning and evening dose was eventually stopped. She was on Victoza and Invokana later in an attempt to improve her sugars but with only limited success. Because of continued poor control with subcutaneous insulin and large insulin requirement she was switched to a [employer] insulin pump which she started on 07/29/13 Prior to starting insulin pump her blood sugar at home was averaging 273 With the U 500 insulin in the pump her blood sugars were dramatically better with rare hypoglycemia  She was switched to NovoLog insulin in her pump instead of U-500 insulin in 3/15 because of  episode of severe overnight hypoglycemia  RECENT history:   Pump settings  Basal rate midnight = 0.7, 3 AM = 0.6, 7 AM = 2.2, 11 AM = 3.9, 4 PM = 2.0 and 9 PM = 3.0, total 52 units Carbohydrate coverage 1:5, correction 1: 30 and target 110-120, insulin duration 4 hours  Her compliance with her pump management has been slightly better as she is trying to check her blood sugars more frequently than before   Today her blood sugar analysis is difficult because her a.m. And p.m. Readings are flipped on her pump Current blood sugar patterns and problems:  Has checks her blood sugars mostly between about 12 noon and 10 PM  and these are showing up on her download as nighttime readings    since her pump time worse she is getting a higher basal rate during the night and lower basal rates in the afternoons instead of the other way around   she has relatively higher blood sugars in the afternoons compared to her first morning reading which is usually late in the day   blood sugars are still modestly high in the evenings   Has more  Variability in blood sugars in the evenings    she is still not bolusing consistently and is bolusing from 1-3 times a day and is not always bolus when the blood sugars are high  No hypoglycemia  Overall average blood sugar is high at  182, previously 166    she has increase her exercise level significantly since her last visit  HOME glucose readings for the last 2 weeks from pump download:  PRE-MEAL  a.m.  2-5 PM Dinner Bedtime Overall  Glucose range:  151- 191  73- 250  97- 234  ?   Mean/median:     182   Bolus events:  1.9 per day,  100% with correction BASAL total amount = 52.5 units, total insulin 66 units on average, bolus percent use 24 %  Meals: 2-3 meals per day, at 11 am, 3 pm and 7 pm Food preferences: She has oatmeal or cereal and juice,  toast  occasionally with egg, usually has a sandwich and  salad for lunch and vegetables for dinner with fish or lean meat for protein. Will snack on apples, raisons or wheat thins.   Wt Readings from Last 3 Encounters:  01/21/15 178 lb 3.2 oz (80.831 kg)  09/30/14 176 lb (79.833 kg)  08/17/14 191 lb (86.637 kg)   Physical activity: exercise: at Gym for 90 min, 4/7 days and also walking on her own.  Dietician visit: Most recent: 2012  Retinal exam: Most recent: 2010.   Complications: No retinopathy or known nephropathy.    Lab Results  Component Value Date   HGBA1C 7.9* 01/18/2015   HGBA1C 7.7* 09/30/2014   HGBA1C 8.8* 05/20/2014   Lab Results  Component Value Date   MICROALBUR 1.1 07/28/2014   LDLCALC 106*  01/18/2015   CREATININE 1.35* 01/18/2015       Medication List       This list is accurate as of: 01/21/15 11:59 PM.  Always use your most recent med list.               ADVAIR DISKUS 250-50 MCG/DOSE Aepb  Generic drug:  Fluticasone-Salmeterol  Inhale 1 puff into the lungs 2 (two) times daily as needed (for shortness of breath).     amLODipine 10 MG tablet  Commonly known as:  NORVASC  Take 10 mg by mouth every evening.     baclofen 10 MG tablet  Commonly known as:  LIORESAL  Take 1 tablet (10 mg total) by mouth 3 (three) times daily. As needed for muscle spasm     docusate sodium 100 MG capsule  Commonly known as:  COLACE  Take 100 mg by mouth 2 (two) times daily as needed for mild constipation.     EPIPEN 2-PAK 0.3 mg/0.3 mL Soaj injection  Generic drug:  EPINEPHrine  Inject 0.3 mg into the muscle once.     glucagon 1 MG injection  Commonly known as:  GLUCAGON EMERGENCY  Inject 1 mg into the vein once as needed (for low blood sugar).     glucose blood test strip  Commonly known as:  BAYER CONTOUR NEXT TEST  Use as instructed to check blood sugar 5 times per day dx code 250.02     hydrOXYzine 25 MG tablet  Commonly known as:  ATARAX/VISTARIL     insulin lispro 100 UNIT/ML injection  Commonly known as:  HUMALOG  Use max 60 units per day with insulin pump     Liraglutide 18 MG/3ML Sopn  Commonly known as:  VICTOZA  Inject 1.2 mg daily     losartan-hydrochlorothiazide 100-25 MG per tablet  Commonly known as:  HYZAAR  Take 2 tablets by mouth daily.     ondansetron 4 MG tablet  Commonly known as:  ZOFRAN  Take 1 tablet (4 mg total) by mouth every 8 (eight) hours as needed for nausea or vomiting.     oxyCODONE-acetaminophen 10-325 MG per tablet  Commonly known as:  PERCOCET  Take 1-2 tablets by mouth every 6 (six) hours as needed for pain. MAXIMUM TOTAL ACETAMINOPHEN DOSE IS 4000 MG PER DAY     pantoprazole 40 MG tablet  Commonly known as:  PROTONIX  Take  40 mg by mouth every evening.     PNEUMOVAX 23 25 MCG/0.5ML injection  Generic drug:  pneumococcal 23 valent vaccine     pravastatin 80 MG tablet  Commonly known as:  PRAVACHOL  Take 1 tablet (80 mg total) by mouth every morning.     rivaroxaban 10 MG  Tabs tablet  Commonly known as:  XARELTO  Take 1 tablet (10 mg total) by mouth daily.     sennosides-docusate sodium 8.6-50 MG tablet  Commonly known as:  SENOKOT-S  Take 2 tablets by mouth daily.     solifenacin 5 MG tablet  Commonly known as:  VESICARE  Take 5 mg by mouth every morning.     spironolactone 50 MG tablet  Commonly known as:  ALDACTONE  Take 1 tablet (50 mg total) by mouth daily.     trimethoprim 100 MG tablet  Commonly known as:  TRIMPEX  Take 100 mg by mouth daily.        Allergies:  Allergies  Allergen Reactions  . Codeine Itching and Nausea And Vomiting    Past Medical History  Diagnosis Date  . Hypertension   . Fibromyalgia   . Hyperlipidemia   . SUI (stress urinary incontinence, female)   . Diabetes mellitus type 2, insulin dependent   . Insulin pump in place     since 12/ 2014--  [employer]  . GERD (gastroesophageal reflux disease)   . H/O hiatal hernia   . History of esophageal dilatation   . Renal insufficiency   . History of rhabdomyolysis     03/ 2015  . Asthma   . Wears glasses   . Full dentures   . Headache     h/o migraines - was followed for a time with a wellness doctor  . Arthritis     L knee, hands, back   . Osteoarthritis of left knee, primary localized 08/17/2014    Past Surgical History  Procedure Laterality Date  . Carpal tunnel release Right 2000  . Knee arthroscopy Right 1989  &  2002  . Excision left upper arm mass  01-29-2014  . Cardiac catheterization  07-20-2009   DR Treutlen  NORMAL  LVEF/  NORMAL CORONARY AND RENAL ARTERIES  . Colonoscopy with esophagogastroduodenoscopy (egd)  06-18-2002  . Esophagogastroduodenoscopy (egd) with  esophageal dilation  06-12-2010  . Cholecystectomy  1990  . Negative sleep study  2012    PER PT  . Bladder suspension N/A 03/09/2014    Procedure: CYSTOSCOPY/SLING;  Surgeon: Reece Packer, MD;  Location: [facility];  Service: Urology;  Laterality: N/A;  . Eye surgery  2010    laser on R eye  . Partial knee arthroplasty Left 08/17/2014    Procedure: LEFT UNICOMPARTMENTAL KNEE;  Surgeon: Johnny Bridge, MD;  Location: [facility];  Service: Orthopedics;  Laterality: Left;    Family History  Problem Relation Age of Onset  . Diabetes Mother   . Hypertension Mother   . Cancer Mother   . Diabetes Father   . Hypertension Father   . Diabetes Brother     Social History:  reports that she has never smoked. She has never used smokeless tobacco. She reports that she does not drink alcohol or use illicit drugs.  Review of Systems   Creatinine was as high as  1.6 previously and improved this year   Lab Results  Component Value Date   CREATININE 1.35* 01/18/2015     HYPERTENSION:  This is  Now better controlled, has been followed by PCP. No recent blood pressure medication changes made Aldactone has been continued at 50 mg   Hypokalemia: none.  She is on Aldactone and HCTZ     HYPERLIPIDEMIA: Calculated LDL is not at goal with using  pravastatin 40  mg prescribed by PCP  Lab Results  Component Value Date   CHOL 170 01/18/2015   HDL 45.70 01/18/2015   LDLCALC 106* 01/18/2015   LDLDIRECT 152.8 10/09/2013   TRIG 94.0 01/18/2015   CHOLHDL 4 01/18/2015     Examination:   BP 138/78 mmHg  Pulse 102  Temp(Src) 98 F (36.7 C)  Resp 16  Ht 5\' 4"  (1.626 m)  Wt 178 lb 3.2 oz (80.831 kg)  BMI 30.57 kg/m2  SpO2 96%  Body mass index is 30.57 kg/(m^2).   No ankle edema  Assesment/PLAN:   Diabetes type 2, uncontrolled  See history of present illness for detailed discussion of her current management, problems identified and blood sugar patterns Her blood sugars  are overall slightly better especially in the morning with basal rate changes She is doing better with her boluses although not consistent and is missing some boluses and she has high sugars and possibly at some of her meals and high carbohydrate snacks A1c has been persistently over 7% and now 7.9 She still has an average of 182 on her home glucose readings; this is likely to be from her getting the wrong basal rates with her pump time reading about 12 hours behind Surprisingly she is not getting any low blood sugars during the night when she is getting higher basal rates She has however maintained her weight lower this year with continuing intensive exercise program  Her date on the pump was change in the office She will need to follow-up in 3 weeks to review her blood sugar patterns as these will be different with the correct basal rates She may need to reduce her basal rates during the day if she is exercising and this is improving her readings We will have her check blood sugars more consistently also in the next few days and call if unusually high or low Reminded her to continue improving her compliance with glucose monitoring including after meals and bolusing  Hypertension: This is now well controlled with current regimen  Also potassium is stable with 50 mg Aldactone and will continue the same dose  Renal insufficiency: It has improved and is stable now with creatinine 1.35  Hyperlipidemia: Since her LDL is persistently high will increase her pravastatin to 80 mg May consider going back to Lipitor lipids are not well controlled    Jesalyn Finazzo 01/22/2015, 5:29 PM   Addendum: Labs as follows  Lab on 01/18/2015  Component Date Value Ref Range Status  . Hgb A1c MFr Bld 01/18/2015 7.9* 4.6 - 6.5 % Final   Glycemic Control Guidelines for People with Diabetes:Non Diabetic:  <6%Goal of Therapy: <7%Additional Action Suggested:  >8%   . Sodium 01/18/2015 137  135 - 145 mEq/L Final  .  Potassium 01/18/2015 3.6  3.5 - 5.1 mEq/L Final  . Chloride 01/18/2015 100  96 - 112 mEq/L Final  . CO2 01/18/2015 30  19 - 32 mEq/L Final  . Glucose, Bld 01/18/2015 191* 70 - 99 mg/dL Final  . BUN 01/18/2015 14  6 - 23 mg/dL Final  . Creatinine, Ser 01/18/2015 1.35* 0.40 - 1.20 mg/dL Final  . Total Bilirubin 01/18/2015 0.5  0.2 - 1.2 mg/dL Final  . Alkaline Phosphatase 01/18/2015 92  39 - 117 U/L Final  . AST 01/18/2015 17  0 - 37 U/L Final  . ALT 01/18/2015 11  0 - 35 U/L Final  . Total Protein 01/18/2015 6.8  6.0 - 8.3 g/dL Final  . Albumin 01/18/2015 3.6  3.5 - 5.2 g/dL Final  . Calcium 01/18/2015 9.1  8.4 - 10.5 mg/dL Final  . GFR 01/18/2015 51.01* >60.00 mL/min Final  . Cholesterol 01/18/2015 170  0 - 200 mg/dL Final   ATP III Classification       Desirable:  < 200 mg/dL               Borderline High:  200 - 239 mg/dL          High:  > = 240 mg/dL  . Triglycerides 01/18/2015 94.0  0.0 - 149.0 mg/dL Final   Normal:  <150 mg/dLBorderline High:  150 - 199 mg/dL  . HDL 01/18/2015 45.70  >39.00 mg/dL Final  . VLDL 01/18/2015 18.8  0.0 - 40.0 mg/dL Final  . LDL Cholesterol 01/18/2015 106* 0 - 99 mg/dL Final  . Total CHOL/HDL Ratio 01/18/2015 4   Final                  Men          Women1/2 Average Risk     3.4          3.3Average Risk          5.0          4.42X Average Risk          9.6          7.13X Average Risk          15.0          11.0                      . NonHDL 01/18/2015 124.30   Final   NOTE:  Non-HDL goal should be 30 mg/dL higher than patient's LDL goal (i.e. LDL goal of < 70 mg/dL, would have non-HDL goal of < 100 mg/dL)

## 2015-01-26 ENCOUNTER — Other Ambulatory Visit: Payer: Self-pay

## 2015-01-26 DIAGNOSIS — Z1231 Encounter for screening mammogram for malignant neoplasm of breast: Secondary | ICD-10-CM

## 2015-02-04 ENCOUNTER — Ambulatory Visit
Admission: RE | Admit: 2015-02-04 | Discharge: 2015-02-04 | Disposition: A | Discharge status: Home / Self Care | Payer: Medicare Other | Source: Ambulatory Visit

## 2015-02-04 DIAGNOSIS — Z1231 Encounter for screening mammogram for malignant neoplasm of breast: Secondary | ICD-10-CM | POA: Diagnosis not present

## 2015-02-11 ENCOUNTER — Encounter: Payer: Self-pay | Admitting: Endocrinology

## 2015-02-11 ENCOUNTER — Ambulatory Visit (INDEPENDENT_AMBULATORY_CARE_PROVIDER_SITE_OTHER): Payer: Medicare Other | Admitting: Endocrinology

## 2015-02-11 VITALS — BP 146/84 | HR 96 | Temp 97.7°F | Resp 16 | Ht 64.0 in | Wt 174.0 lb

## 2015-02-11 DIAGNOSIS — G629 Polyneuropathy, unspecified: Secondary | ICD-10-CM | POA: Diagnosis not present

## 2015-02-11 DIAGNOSIS — E1142 Type 2 diabetes mellitus with diabetic polyneuropathy: Secondary | ICD-10-CM | POA: Diagnosis not present

## 2015-02-11 DIAGNOSIS — E1165 Type 2 diabetes mellitus with hyperglycemia: Secondary | ICD-10-CM | POA: Diagnosis not present

## 2015-02-11 DIAGNOSIS — I10 Essential (primary) hypertension: Secondary | ICD-10-CM

## 2015-02-11 DIAGNOSIS — IMO0002 Reserved for concepts with insufficient information to code with codable children: Secondary | ICD-10-CM

## 2015-02-11 MED ORDER — GABAPENTIN 300 MG PO CAPS: 300.0000 mg | ORAL_CAPSULE | Freq: Three times a day (TID) | ORAL | Status: DC

## 2015-02-11 NOTE — Patient Instructions (Addendum)
6 am 2.35, 11AM 4, 3 PM 2.2  Spironolactone 2 of 50 daily and check potassium

## 2015-02-11 NOTE — Progress Notes (Signed)
Patient ID: [name], female   DOB: 03-Feb-1951, 64 y.o.   MRN: [id number]   Reason for Appointment: Diabetes follow-up   History of Present Illness   Diagnosis: Type 2, date of diagnosis:1985.    PAST history: She has had difficult to control diabetes requiring large doses of insulin especially at meal times. Because of poor control she was switched from NovoLog to U-500 insulin in 11/2012. At that time her A1c was 9%  Initially seemed to have better blood sugars. With no clear response with Invokana this was stopped and she was started on Actos. Her insulin was adjusted with higher mealtime coverage at breakfast and lunch and lower amounts at supper. Lantus requirement appeared to be more in the morning and evening dose was eventually stopped. She was on Victoza and Invokana later in an attempt to improve her sugars but with only limited success. Because of continued poor control with subcutaneous insulin and large insulin requirement she was switched to a Medtronic insulin pump which she started on 07/29/13 Prior to starting insulin pump her blood sugar at home was averaging 273 With the U 500 insulin in the pump her blood sugars were dramatically better with rare hypoglycemia  She was switched to NovoLog insulin in her pump instead of U-500 insulin in 3/15 because of  episode of severe overnight hypoglycemia  RECENT history:   Pump settings  Basal rate midnight = 0.7, 3 AM = 0.6, 7 AM = 2.2, 11 AM = 3.9, 4 PM = 2.2 and 9 PM = 3.0, total 53 units Carbohydrate coverage 1:5, correction 1: 40 and target 110-120, insulin duration 4 hours  Her most recent A1c has been 7.9 and she still requires relatively large amounts of basal insulin on her pump On her last visit she was getting the time on her pump switched between morning and evening and was having inconsistent blood sugars and overall high readings Current blood sugar patterns and problems:  Has her first blood sugar  test in the morning usually around 10-11 AM and these are mostly high  Blood sugars are generally trending lower in the afternoon and he usually low normal between about 5 PM-8 PM  Has occasional high readings after her evening meal but usually not checking them postprandially  She does get improvement in her blood sugars in the afternoons after doing a correction for high readings  She has only one relatively low reading of 67 in the early afternoon when she had an early breakfast and no lunch   BOLUSES: She is again not bolusing for high readings to do a correction and only when she is having a meal or large snack  However she is doing better with trying to bolus with meals even when blood sugars are normal  Usually not overriding her bolus wizard  HOME glucose readings for the last 2 weeks from pump download:  Mean values apply above for all meters except median for One Touch  PRE-MEAL Fasting Lunch Dinner Bedtime Overall  Glucose range:  130-190    98-180     Mean/median:  165     148  150 +/-44    POST-MEAL PC Breakfast PC Lunch PC Dinner  Glucose range:   73   190, 191   Mean/median:       Bolus events:  Mostly 2 per day,  100% with correction BASAL total amount = 52.5 units, total insulin 66 units on average, bolus percent use 24 %  Meals: 2-3 meals per day, at 11 am, 3 pm and 7 pm Food preferences: She has oatmeal or cereal and juice,  toast  occasionally with egg, usually has a sandwich and salad for lunch and vegetables for dinner with fish or lean meat for protein. Will snack on apples, raisons or wheat thins.   Wt Readings from Last 3 Encounters:  02/11/15 174 lb (78.926 kg)  01/21/15 178 lb 3.2 oz (80.831 kg)  09/30/14 176 lb (79.833 kg)   Physical activity: exercise: at Gym for 90 min, 4/7 days and also walking on her own.  Dietician visit: Most recent: 2012    Lab Results  Component Value Date   HGBA1C 7.9* 01/18/2015   HGBA1C 7.7* 09/30/2014   HGBA1C  8.8* 05/20/2014   Lab Results  Component Value Date   MICROALBUR 1.1 07/28/2014   LDLCALC 106* 01/18/2015   CREATININE 1.35* 01/18/2015       Medication List       This list is accurate as of: 02/11/15  9:32 PM.  Always use your most recent med list.               ADVAIR DISKUS 250-50 MCG/DOSE Aepb  Generic drug:  Fluticasone-Salmeterol  Inhale 1 puff into the lungs 2 (two) times daily as needed (for shortness of breath).     amLODipine 10 MG tablet  Commonly known as:  NORVASC  Take 10 mg by mouth every evening.     baclofen 10 MG tablet  Commonly known as:  LIORESAL  Take 1 tablet (10 mg total) by mouth 3 (three) times daily. As needed for muscle spasm     docusate sodium 100 MG capsule  Commonly known as:  COLACE  Take 100 mg by mouth 2 (two) times daily as needed for mild constipation.     EPIPEN 2-PAK 0.3 mg/0.3 mL Soaj injection  Generic drug:  EPINEPHrine  Inject 0.3 mg into the muscle once.     gabapentin 300 MG capsule  Commonly known as:  NEURONTIN  Take 1 capsule (300 mg total) by mouth 3 (three) times daily.     glucagon 1 MG injection  Commonly known as:  GLUCAGON EMERGENCY  Inject 1 mg into the vein once as needed (for low blood sugar).     glucose blood test strip  Commonly known as:  BAYER CONTOUR NEXT TEST  Use as instructed to check blood sugar 5 times per day dx code 250.02     hydrOXYzine 25 MG tablet  Commonly known as:  ATARAX/VISTARIL     insulin lispro 100 UNIT/ML injection  Commonly known as:  HUMALOG  Use max 60 units per day with insulin pump     Liraglutide 18 MG/3ML Sopn  Commonly known as:  VICTOZA  Inject 1.2 mg daily     losartan-hydrochlorothiazide 100-25 MG per tablet  Commonly known as:  HYZAAR  Take 2 tablets by mouth daily.     ondansetron 4 MG tablet  Commonly known as:  ZOFRAN  Take 1 tablet (4 mg total) by mouth every 8 (eight) hours as needed for nausea or vomiting.     pantoprazole 40 MG tablet  Commonly  known as:  PROTONIX  Take 40 mg by mouth every evening.     PNEUMOVAX 23 25 MCG/0.5ML injection  Generic drug:  pneumococcal 23 valent vaccine     pravastatin 80 MG tablet  Commonly known as:  PRAVACHOL  Take 1 tablet (80 mg total) by mouth  every morning.     rivaroxaban 10 MG Tabs tablet  Commonly known as:  XARELTO  Take 1 tablet (10 mg total) by mouth daily.     sennosides-docusate sodium 8.6-50 MG tablet  Commonly known as:  SENOKOT-S  Take 2 tablets by mouth daily.     solifenacin 5 MG tablet  Commonly known as:  VESICARE  Take 5 mg by mouth every morning.     spironolactone 50 MG tablet  Commonly known as:  ALDACTONE  Take 1 tablet (50 mg total) by mouth daily.     trimethoprim 100 MG tablet  Commonly known as:  TRIMPEX  Take 100 mg by mouth daily.        Allergies:  Allergies  Allergen Reactions  . Codeine Itching and Nausea And Vomiting    Past Medical History  Diagnosis Date  . Hypertension   . Fibromyalgia   . Hyperlipidemia   . SUI (stress urinary incontinence, female)   . Diabetes mellitus type 2, insulin dependent   . Insulin pump in place     since 12/ 2014--  [facility]  . GERD (gastroesophageal reflux disease)   . H/O hiatal hernia   . History of esophageal dilatation   . Renal insufficiency   . History of rhabdomyolysis     03/ 2015  . Asthma   . Wears glasses   . Full dentures   . Headache     h/o migraines - was followed for a time with a wellness doctor  . Arthritis     L knee, hands, back   . Osteoarthritis of left knee, primary localized 08/17/2014    Past Surgical History  Procedure Laterality Date  . Carpal tunnel release Right 2000  . Knee arthroscopy Right 1989  &  2002  . Excision left upper arm mass  01-29-2014  . Cardiac catheterization  07-20-2009   DR [place]  NORMAL  LVEF/  NORMAL CORONARY AND RENAL ARTERIES  . Colonoscopy with esophagogastroduodenoscopy (egd)  06-18-2002  .  Esophagogastroduodenoscopy (egd) with esophageal dilation  06-12-2010  . Cholecystectomy  1990  . Negative sleep study  2012    PER PT  . Bladder suspension N/A 03/09/2014    Procedure: CYSTOSCOPY/SLING;  Surgeon: Reece Packer, MD;  Location: [facility];  Service: Urology;  Laterality: N/A;  . Eye surgery  2010    laser on R eye  . Partial knee arthroplasty Left 08/17/2014    Procedure: LEFT UNICOMPARTMENTAL KNEE;  Surgeon: Johnny Bridge, MD;  Location: [place];  Service: Orthopedics;  Laterality: Left;    Family History  Problem Relation Age of Onset  . Diabetes Mother   . Hypertension Mother   . Cancer Mother   . Diabetes Father   . Hypertension Father   . Diabetes Brother     Social History:  reports that she has never smoked. She has never used smokeless tobacco. She reports that she does not drink alcohol or use illicit drugs.  Review of Systems   Creatinine was as high as  1.6 previously and improved this year   Lab Results  Component Value Date   CREATININE 1.35* 01/18/2015    HYPERTENSION:  This is  Now better controlled, has been followed by PCP. No recent blood pressure medication changes made Aldactone has been continued at 50 mg   Hypokalemia: none.  She is on Aldactone and HCTZ     HYPERLIPIDEMIA: Calculated LDL is  not at goal with using  pravastatin 40 mg prescribed by PCP  Lab Results  Component Value Date   CHOL 170 01/18/2015   HDL 45.70 01/18/2015   LDLCALC 106* 01/18/2015   LDLDIRECT 152.8 10/09/2013   TRIG 94.0 01/18/2015   CHOLHDL 4 01/18/2015    NEUROPATHY: She is again having symptoms of burning and tingling in her feet at various times especially at night, has had prescription for gabapentin in the past.    Examination:   BP 146/84 mmHg  Pulse 96  Temp(Src) 97.7 F (36.5 C)  Resp 16  Ht 5\' 4"  (1.626 m)  Wt 174 lb (78.926 kg)  BMI 29.85 kg/m2  SpO2 97%  Body mass index is 29.85 kg/(m^2).   Repeat blood  pressure 160/88  Diabetic foot exam shows normal monofilament sensation in the toes and plantar surfaces, no skin lesions or ulcers on the feet and normal pedal pulses  Assesment/PLAN:   Diabetes type 2, uncontrolled  See history of present illness for detailed discussion of her current management, problems identified and blood sugar patterns Her blood sugars are overall slightly better with resetting her pump timings on the last visit However she is still having relatively high readings before her first meal of the day and some late in the evening also On the other hand her blood sugars are low normal frequently around 4-6 PM without any hypoglycemia  She is doing better with her boluses although not consistent; she can do better with bolusing for all high readings and also not clear if she is missing any boluses for meals A1c has been persistently over 7% and last 7.9 Recent average blood sugar 150 but most of these are pre-meal readings Discussed timing of glucose monitoring and improved compliance with boluses Reviewed basal rate changes with the patient as follows:  midnight = 0.7, 3 AM = 0.6, 6 AM = 2.35, 11 AM = 4.0, 3 PM = 2.2 and 9 PM = 3.0,  NEUROPATHY: She is again symptomatic and will give her trial of gabapentin which she thinks she has used before with relief Currently does not appear to have any sensory loss on exam  Hypertension: This is still not well controlled with current regimen Will increase her Aldactone to 100 mg daily until seen by her PCP and he can follow-up her blood pressure and potassium level also  Counseling time on subjects discussed above is over 50% of today's 25 minute visit   Chadwick Reiswig 02/11/2015, 9:32 PM

## 2015-02-17 DIAGNOSIS — E784 Other hyperlipidemia: Secondary | ICD-10-CM | POA: Diagnosis not present

## 2015-02-17 DIAGNOSIS — I10 Essential (primary) hypertension: Secondary | ICD-10-CM | POA: Diagnosis not present

## 2015-02-17 DIAGNOSIS — G4700 Insomnia, unspecified: Secondary | ICD-10-CM | POA: Diagnosis not present

## 2015-02-17 DIAGNOSIS — J4520 Mild intermittent asthma, uncomplicated: Secondary | ICD-10-CM | POA: Diagnosis not present

## 2015-02-17 DIAGNOSIS — E1165 Type 2 diabetes mellitus with hyperglycemia: Secondary | ICD-10-CM | POA: Diagnosis not present

## 2015-03-04 DIAGNOSIS — E1165 Type 2 diabetes mellitus with hyperglycemia: Secondary | ICD-10-CM | POA: Diagnosis not present

## 2015-03-07 DIAGNOSIS — M1712 Unilateral primary osteoarthritis, left knee: Secondary | ICD-10-CM | POA: Diagnosis not present

## 2015-03-07 DIAGNOSIS — S5002XA Contusion of left elbow, initial encounter: Secondary | ICD-10-CM | POA: Diagnosis not present

## 2015-03-11 DIAGNOSIS — M25561 Pain in right knee: Secondary | ICD-10-CM | POA: Diagnosis not present

## 2015-03-11 DIAGNOSIS — G894 Chronic pain syndrome: Secondary | ICD-10-CM | POA: Diagnosis not present

## 2015-03-11 DIAGNOSIS — M25562 Pain in left knee: Secondary | ICD-10-CM | POA: Diagnosis not present

## 2015-03-16 DIAGNOSIS — N3020 Other chronic cystitis without hematuria: Secondary | ICD-10-CM | POA: Diagnosis not present

## 2015-03-16 DIAGNOSIS — R350 Frequency of micturition: Secondary | ICD-10-CM | POA: Diagnosis not present

## 2015-03-16 DIAGNOSIS — N3946 Mixed incontinence: Secondary | ICD-10-CM | POA: Diagnosis not present

## 2015-03-22 DIAGNOSIS — I10 Essential (primary) hypertension: Secondary | ICD-10-CM | POA: Diagnosis not present

## 2015-03-22 DIAGNOSIS — J4520 Mild intermittent asthma, uncomplicated: Secondary | ICD-10-CM | POA: Diagnosis not present

## 2015-03-22 DIAGNOSIS — K2900 Acute gastritis without bleeding: Secondary | ICD-10-CM | POA: Diagnosis not present

## 2015-03-22 DIAGNOSIS — E1165 Type 2 diabetes mellitus with hyperglycemia: Secondary | ICD-10-CM | POA: Diagnosis not present

## 2015-03-22 DIAGNOSIS — E784 Other hyperlipidemia: Secondary | ICD-10-CM | POA: Diagnosis not present

## 2015-04-03 DIAGNOSIS — I10 Essential (primary) hypertension: Secondary | ICD-10-CM | POA: Diagnosis not present

## 2015-04-03 DIAGNOSIS — B029 Zoster without complications: Secondary | ICD-10-CM | POA: Diagnosis not present

## 2015-04-03 DIAGNOSIS — E119 Type 2 diabetes mellitus without complications: Secondary | ICD-10-CM | POA: Diagnosis not present

## 2015-04-03 DIAGNOSIS — Z794 Long term (current) use of insulin: Secondary | ICD-10-CM | POA: Diagnosis not present

## 2015-04-03 DIAGNOSIS — K219 Gastro-esophageal reflux disease without esophagitis: Secondary | ICD-10-CM | POA: Diagnosis not present

## 2015-04-03 DIAGNOSIS — E780 Pure hypercholesterolemia: Secondary | ICD-10-CM | POA: Diagnosis not present

## 2015-04-03 DIAGNOSIS — Z79899 Other long term (current) drug therapy: Secondary | ICD-10-CM | POA: Diagnosis not present

## 2015-04-04 DIAGNOSIS — S5002XD Contusion of left elbow, subsequent encounter: Secondary | ICD-10-CM | POA: Diagnosis not present

## 2015-05-05 DIAGNOSIS — N3946 Mixed incontinence: Secondary | ICD-10-CM | POA: Diagnosis not present

## 2015-05-05 DIAGNOSIS — N3020 Other chronic cystitis without hematuria: Secondary | ICD-10-CM | POA: Diagnosis not present

## 2015-05-11 ENCOUNTER — Other Ambulatory Visit: Payer: Self-pay

## 2015-05-16 ENCOUNTER — Ambulatory Visit: Payer: Self-pay | Admitting: Endocrinology

## 2015-05-18 DIAGNOSIS — R55 Syncope and collapse: Secondary | ICD-10-CM | POA: Diagnosis not present

## 2015-05-18 DIAGNOSIS — K219 Gastro-esophageal reflux disease without esophagitis: Secondary | ICD-10-CM | POA: Diagnosis not present

## 2015-05-18 DIAGNOSIS — I10 Essential (primary) hypertension: Secondary | ICD-10-CM | POA: Diagnosis not present

## 2015-05-18 DIAGNOSIS — J4520 Mild intermittent asthma, uncomplicated: Secondary | ICD-10-CM | POA: Diagnosis not present

## 2015-05-18 DIAGNOSIS — E1165 Type 2 diabetes mellitus with hyperglycemia: Secondary | ICD-10-CM | POA: Diagnosis not present

## 2015-05-25 ENCOUNTER — Other Ambulatory Visit (INDEPENDENT_AMBULATORY_CARE_PROVIDER_SITE_OTHER): Payer: Medicare Other

## 2015-05-25 DIAGNOSIS — E1165 Type 2 diabetes mellitus with hyperglycemia: Secondary | ICD-10-CM

## 2015-05-25 DIAGNOSIS — IMO0002 Reserved for concepts with insufficient information to code with codable children: Secondary | ICD-10-CM

## 2015-05-25 DIAGNOSIS — E7800 Pure hypercholesterolemia, unspecified: Secondary | ICD-10-CM

## 2015-05-25 LAB — BASIC METABOLIC PANEL
BUN: 12 mg/dL (ref 6–23)
CALCIUM: 9.6 mg/dL (ref 8.4–10.5)
CO2: 32 mEq/L (ref 19–32)
CREATININE: 1.25 mg/dL — AB (ref 0.40–1.20)
Chloride: 102 mEq/L (ref 96–112)
GFR: 55.69 mL/min — AB (ref >60.00)
Glucose, Bld: 170 mg/dL — ABNORMAL HIGH (ref 70–99)
Potassium: 3.8 mEq/L (ref 3.5–5.1)
Sodium: 141 mEq/L (ref 135–145)

## 2015-05-25 LAB — LIPID PANEL
CHOLESTEROL: 159 mg/dL (ref 0–200)
HDL: 47.5 mg/dL (ref >39.00)
LDL Cholesterol: 96 mg/dL (ref 0–99)
NonHDL: 111.78
TRIGLYCERIDES: 78 mg/dL (ref 0.0–149.0)
Total CHOL/HDL Ratio: 3
VLDL: 15.6 mg/dL (ref 0.0–40.0)

## 2015-05-25 LAB — HEMOGLOBIN A1C: HEMOGLOBIN A1C: 7.7 % — AB (ref 4.6–6.5)

## 2015-05-26 DIAGNOSIS — E669 Obesity, unspecified: Secondary | ICD-10-CM | POA: Diagnosis not present

## 2015-05-26 DIAGNOSIS — K219 Gastro-esophageal reflux disease without esophagitis: Secondary | ICD-10-CM | POA: Diagnosis not present

## 2015-05-26 DIAGNOSIS — R1310 Dysphagia, unspecified: Secondary | ICD-10-CM | POA: Diagnosis not present

## 2015-05-26 DIAGNOSIS — K580 Irritable bowel syndrome with diarrhea: Secondary | ICD-10-CM | POA: Diagnosis not present

## 2015-05-26 DIAGNOSIS — R140 Abdominal distension (gaseous): Secondary | ICD-10-CM | POA: Diagnosis not present

## 2015-06-01 ENCOUNTER — Other Ambulatory Visit: Payer: Self-pay | Admitting: *Deleted

## 2015-06-01 ENCOUNTER — Encounter: Payer: Self-pay | Admitting: Endocrinology

## 2015-06-01 ENCOUNTER — Ambulatory Visit (INDEPENDENT_AMBULATORY_CARE_PROVIDER_SITE_OTHER): Payer: Medicare Other | Admitting: Endocrinology

## 2015-06-01 VITALS — BP 134/70 | HR 76 | Temp 98.2°F | Resp 16 | Ht 64.0 in | Wt 185.8 lb

## 2015-06-01 DIAGNOSIS — E7800 Pure hypercholesterolemia, unspecified: Secondary | ICD-10-CM

## 2015-06-01 DIAGNOSIS — I10 Essential (primary) hypertension: Secondary | ICD-10-CM

## 2015-06-01 DIAGNOSIS — E1122 Type 2 diabetes mellitus with diabetic chronic kidney disease: Secondary | ICD-10-CM | POA: Diagnosis not present

## 2015-06-01 DIAGNOSIS — N182 Chronic kidney disease, stage 2 (mild): Secondary | ICD-10-CM

## 2015-06-01 DIAGNOSIS — IMO0002 Reserved for concepts with insufficient information to code with codable children: Secondary | ICD-10-CM

## 2015-06-01 DIAGNOSIS — Z794 Long term (current) use of insulin: Secondary | ICD-10-CM

## 2015-06-01 DIAGNOSIS — E1165 Type 2 diabetes mellitus with hyperglycemia: Secondary | ICD-10-CM | POA: Diagnosis not present

## 2015-06-01 MED ORDER — LIRAGLUTIDE 18 MG/3ML ~~LOC~~ SOPN: PEN_INJECTOR | SUBCUTANEOUS | Status: DC

## 2015-06-01 NOTE — Patient Instructions (Signed)
Basal at 3 pm 2.0  Bolus for all Carbs at meals even if not testing

## 2015-06-01 NOTE — Progress Notes (Signed)
Patient ID: [name], female   DOB: 1951-06-03, 64 y.o.   MRN: [id number]   Reason for Appointment: Diabetes follow-up   History of Present Illness   Diagnosis: Type 2, date of diagnosis:1985.    PAST history: She has had difficult to control diabetes requiring large doses of insulin especially at meal times. Because of poor control she was switched from NovoLog to U-500 insulin in 11/2012. At that time her A1c was 9%  Initially seemed to have better blood sugars. With no clear response with Invokana this was stopped and she was started on Actos. Her insulin was adjusted with higher mealtime coverage at breakfast and lunch and lower amounts at supper. Lantus requirement appeared to be more in the morning and evening dose was eventually stopped. She was on Victoza and Invokana later in an attempt to improve her sugars but with only limited success.  Because of continued poor control with subcutaneous insulin and large insulin requirement she was switched to a Medtronic insulin pump which she started on 07/29/13 Prior to starting insulin pump her blood sugar at home was averaging 273 With the U 500 insulin in the pump her blood sugars were dramatically better with rare hypoglycemia  She was switched to NovoLog insulin in her pump instead of U-500 insulin in 3/15 because of  episode of severe overnight hypoglycemia  RECENT history:    Pump settings  Basal rate midnight = 0.7, 3 AM = 0.6, 6 AM = 2.35, 11 AM = 4.0, 3 PM = 2.2 and 9 PM = 3.0, daily total 53.7 units Carbohydrate coverage 1:5, correction 1: 40 and target 110-120, insulin duration 4 hours Total daily insulin 64 units +/-5  Her most recent A1c is 7.7 and she still requires relatively large amounts of basal insulin on her pump She has changed her basal rate as directed on her last visit  Current blood sugar patterns and problems:  Has her first blood sugar reading in the morning usually just after 12 noon and  these are still mostly high although a couple of readings around 10-11 AM are better  Blood sugars are generally trending lower in the afternoon and still low normal between 6-9 PM  Has occasional high readings after her evening meal but usually not checking them postprandially  She has only one relatively low reading of 77 before supper  BOLUSES: She is again not bolusing for high readings when she is not eating a meal  Again does not have her monitor that connects directly to her pump and not clear if she is putting on her readings  Since she does not always check her blood sugar when she is eating she will not bolus without a blood sugar reading; has at least one day where she has no boluses and frequency of bolusing varies between 1-3 a day with an average of 1.6 per day  Usually not overriding her bolus wizard  HOME glucose readings for the last 2 weeks from pump download:  Mean values apply above for all meters except median for One Touch  PRE-MEAL Fasting  6 PM  Dinner Bedtime Overall  Glucose range: 99-200  103-153   77-191   87-130    Mean/median: 181   117   133+/44   POST-MEAL PC Breakfast PC Lunch PC Dinner  Glucose range:  144-223     Mean/median:      Meals: 2-3 meals per day, at 11 am, 3 pm and 7 pm Food  preferences: She has oatmeal, toast frequently with egg for protein, usually has a sandwich and salad for lunch and vegetables for dinner with fish or lean meat for protein. Will snack on apples, raisons or wheat thins.   Wt Readings from Last 3 Encounters:  06/01/15 185 lb 12.8 oz (84.278 kg)  02/11/15 174 lb (78.926 kg)  01/21/15 178 lb 3.2 oz (80.831 kg)   Physical activity: exercise: at Gym for 90 min, 4/7 days at 1 pm;  Dietician visit: Most recent: 2012    Lab Results  Component Value Date   HGBA1C 7.7* 05/25/2015   HGBA1C 7.9* 01/18/2015   HGBA1C 7.7* 09/30/2014   Lab Results  Component Value Date   MICROALBUR 1.1 07/28/2014   LDLCALC 96  05/25/2015   CREATININE 1.25* 05/25/2015       Medication List       This list is accurate as of: 06/01/15  4:31 PM.  Always use your most recent med list.               ADVAIR DISKUS 250-50 MCG/DOSE Aepb  Generic drug:  Fluticasone-Salmeterol  Inhale 1 puff into the lungs 2 (two) times daily as needed (for shortness of breath).     amLODipine 10 MG tablet  Commonly known as:  NORVASC  Take 10 mg by mouth every evening.     baclofen 10 MG tablet  Commonly known as:  LIORESAL  Take 1 tablet (10 mg total) by mouth 3 (three) times daily. As needed for muscle spasm     docusate sodium 100 MG capsule  Commonly known as:  COLACE  Take 100 mg by mouth 2 (two) times daily as needed for mild constipation.     EPIPEN 2-PAK 0.3 mg/0.3 mL Soaj injection  Generic drug:  EPINEPHrine  Inject 0.3 mg into the muscle once.     gabapentin 300 MG capsule  Commonly known as:  NEURONTIN  Take 1 capsule (300 mg total) by mouth 3 (three) times daily.     glucagon 1 MG injection  Commonly known as:  GLUCAGON EMERGENCY  Inject 1 mg into the vein once as needed (for low blood sugar).     glucose blood test strip  Commonly known as:  BAYER CONTOUR NEXT TEST  Use as instructed to check blood sugar 5 times per day dx code 250.02     hydrOXYzine 25 MG tablet  Commonly known as:  ATARAX/VISTARIL     insulin lispro 100 UNIT/ML injection  Commonly known as:  HUMALOG  Use max 60 units per day with insulin pump     Liraglutide 18 MG/3ML Sopn  Commonly known as:  VICTOZA  Inject 1.2 mg daily     losartan-hydrochlorothiazide 100-25 MG tablet  Commonly known as:  HYZAAR  Take 2 tablets by mouth daily.     ondansetron 4 MG tablet  Commonly known as:  ZOFRAN  Take 1 tablet (4 mg total) by mouth every 8 (eight) hours as needed for nausea or vomiting.     pantoprazole 40 MG tablet  Commonly known as:  PROTONIX  Take 40 mg by mouth every evening.     PNEUMOVAX 23 25 MCG/0.5ML injection    Generic drug:  pneumococcal 23 valent vaccine     pravastatin 80 MG tablet  Commonly known as:  PRAVACHOL  Take 1 tablet (80 mg total) by mouth every morning.     rivaroxaban 10 MG Tabs tablet  Commonly known as:  XARELTO  Take  1 tablet (10 mg total) by mouth daily.     sennosides-docusate sodium 8.6-50 MG tablet  Commonly known as:  SENOKOT-S  Take 2 tablets by mouth daily.     solifenacin 5 MG tablet  Commonly known as:  VESICARE  Take 5 mg by mouth every morning.     spironolactone 50 MG tablet  Commonly known as:  ALDACTONE  Take 1 tablet (50 mg total) by mouth daily.     trimethoprim 100 MG tablet  Commonly known as:  TRIMPEX  Take 100 mg by mouth daily.        Allergies:  Allergies  Allergen Reactions  . Codeine Itching and Nausea And Vomiting    Past Medical History  Diagnosis Date  . Hypertension   . Fibromyalgia   . Hyperlipidemia   . SUI (stress urinary incontinence, female)   . Diabetes mellitus type 2, insulin dependent ([place])   . Insulin pump in place     since 12/ 2014--  [facility]  . GERD (gastroesophageal reflux disease)   . H/O hiatal hernia   . History of esophageal dilatation   . Renal insufficiency   . History of rhabdomyolysis     03/ 2015  . Asthma   . Wears glasses   . Full dentures   . Headache     h/o migraines - was followed for a time with a wellness doctor  . Arthritis     L knee, hands, back   . Osteoarthritis of left knee, primary localized 08/17/2014    Past Surgical History  Procedure Laterality Date  . Carpal tunnel release Right 2000  . Knee arthroscopy Right 1989  &  2002  . Excision left upper arm mass  01-29-2014  . Cardiac catheterization  07-20-2009   DR [place]  NORMAL  LVEF/  NORMAL CORONARY AND RENAL ARTERIES  . Colonoscopy with esophagogastroduodenoscopy (egd)  06-18-2002  . Esophagogastroduodenoscopy (egd) with esophageal dilation  06-12-2010  . Cholecystectomy  1990  . Negative  sleep study  2012    PER PT  . Bladder suspension N/A 03/09/2014    Procedure: CYSTOSCOPY/SLING;  Surgeon: Reece Packer, MD;  Location: [facility];  Service: Urology;  Laterality: N/A;  . Eye surgery  2010    laser on R eye  . Partial knee arthroplasty Left 08/17/2014    Procedure: LEFT UNICOMPARTMENTAL KNEE;  Surgeon: Johnny Bridge, MD;  Location: [place];  Service: Orthopedics;  Laterality: Left;    Family History  Problem Relation Age of Onset  . Diabetes Mother   . Hypertension Mother   . Cancer Mother   . Diabetes Father   . Hypertension Father   . Diabetes Brother     Social History:  reports that she has never smoked. She has never used smokeless tobacco. She reports that she does not drink alcohol or use illicit drugs.  Review of Systems   She had an episode of syncope which was evaluated by PCP in no etiology found  Creatinine was as high as  1.6 previously and improved this year, now nearly normal   Lab Results  Component Value Date   CREATININE 1.25* 05/25/2015    HYPERTENSION:  This is better controlled, has also been followed by PCP. She is taking amlodipine, losartan HCTZ and Aldactone Aldactone has been increased to 100 mg on her previous visit for better blood pressure control   Hypokalemia: none.  She is on Aldactone  and HCTZ     HYPERLIPIDEMIA: Calculated LDL is at goal with using  pravastatin  mg    Lab Results  Component Value Date   CHOL 159 05/25/2015   HDL 47.50 05/25/2015   LDLCALC 96 05/25/2015   LDLDIRECT 152.8 10/09/2013   TRIG 78.0 05/25/2015   CHOLHDL 3 05/25/2015    NEUROPATHY: She is being treated with gabapentin for paresthesiae in feet    Examination:   BP 134/70 mmHg  Pulse 76  Temp(Src) 98.2 F (36.8 C)  Resp 16  Ht 5\' 4"  (1.626 m)  Wt 185 lb 12.8 oz (84.278 kg)  BMI 31.88 kg/m2  SpO2 97%  Body mass index is 31.88 kg/(m^2).    Diabetic foot exam shows normal monofilament sensation in the toes  and plantar surfaces, no skin lesions or ulcers on the feet and normal pedal pulses  Assesment/PLAN:   Diabetes type 2, uncontrolled  See history of present illness for detailed discussion of her current management, problems identified and blood sugar patterns Her blood sugars are overall about the same with A1c 7.7 However she is still having relatively high readings before her first meal of the day and some late in the evening also On the other hand her blood sugars are low normal in the late afternoon probably because she does exercise around 1 PM  The main problem with her management is lack of adequate boluses for all meals and snacks as well as to cover high blood sugars when she is not eating Again discussed basic management of mealtime coverage and high blood sugar correction with boluses and she is trying to understand better day-to-day management She is able to monitor her blood sugar at least twice a day but at inconsistent times and only a few postprandial readings Emphasized the need for consistent protein at breakfast  Not clear why she has gained weight despite continuing Victoza.  Did not have any significant weight loss with Invokana previously Her only basal rate change would be reduction in the 3 PM basal rate to 2.0, consider increasing her morning basal rate but recent morning readings have been better  NEUROPATHY: Better controlled with  Gabapentin  HYPERLIPIDEMIA:  LDL is better controlled with increasing pravastatin to 80 mg  Hypertension: This is now well controlled with current regimen especially with increasing her Aldactone to 100 mg Her potassium is stable with this despite using losartan also  Recommended discussing  with PCP evaluation with a Holter monitor or event recorder with the help of a cardiologist because of her syncopal episode  Counseling time on subjects discussed above is over 50% of today's 25 minute visit   Berdina Cheever 06/01/2015, 4:31 PM

## 2015-06-03 DIAGNOSIS — K219 Gastro-esophageal reflux disease without esophagitis: Secondary | ICD-10-CM | POA: Diagnosis not present

## 2015-06-03 DIAGNOSIS — R1310 Dysphagia, unspecified: Secondary | ICD-10-CM | POA: Diagnosis not present

## 2015-06-09 DIAGNOSIS — E1142 Type 2 diabetes mellitus with diabetic polyneuropathy: Secondary | ICD-10-CM | POA: Diagnosis not present

## 2015-06-09 DIAGNOSIS — E785 Hyperlipidemia, unspecified: Secondary | ICD-10-CM | POA: Diagnosis not present

## 2015-06-09 DIAGNOSIS — I10 Essential (primary) hypertension: Secondary | ICD-10-CM | POA: Diagnosis not present

## 2015-06-09 DIAGNOSIS — R5383 Other fatigue: Secondary | ICD-10-CM | POA: Diagnosis not present

## 2015-06-13 ENCOUNTER — Other Ambulatory Visit: Payer: Self-pay | Admitting: Endocrinology

## 2015-06-13 DIAGNOSIS — E1165 Type 2 diabetes mellitus with hyperglycemia: Secondary | ICD-10-CM | POA: Diagnosis not present

## 2015-06-13 MED ORDER — BAYER MICROLET LANCETS MISC: Status: DC

## 2015-06-17 DIAGNOSIS — E1142 Type 2 diabetes mellitus with diabetic polyneuropathy: Secondary | ICD-10-CM | POA: Diagnosis not present

## 2015-06-17 DIAGNOSIS — R5383 Other fatigue: Secondary | ICD-10-CM | POA: Diagnosis not present

## 2015-06-17 DIAGNOSIS — I10 Essential (primary) hypertension: Secondary | ICD-10-CM | POA: Diagnosis not present

## 2015-06-17 DIAGNOSIS — Z794 Long term (current) use of insulin: Secondary | ICD-10-CM | POA: Diagnosis not present

## 2015-06-23 DIAGNOSIS — R5383 Other fatigue: Secondary | ICD-10-CM | POA: Diagnosis not present

## 2015-07-13 DIAGNOSIS — D519 Vitamin B12 deficiency anemia, unspecified: Secondary | ICD-10-CM | POA: Diagnosis not present

## 2015-07-20 DIAGNOSIS — D519 Vitamin B12 deficiency anemia, unspecified: Secondary | ICD-10-CM | POA: Diagnosis not present

## 2015-08-04 DIAGNOSIS — E1122 Type 2 diabetes mellitus with diabetic chronic kidney disease: Secondary | ICD-10-CM | POA: Diagnosis not present

## 2015-08-30 ENCOUNTER — Other Ambulatory Visit (INDEPENDENT_AMBULATORY_CARE_PROVIDER_SITE_OTHER): Payer: Medicare Other

## 2015-08-30 DIAGNOSIS — E1122 Type 2 diabetes mellitus with diabetic chronic kidney disease: Secondary | ICD-10-CM

## 2015-08-30 DIAGNOSIS — E1165 Type 2 diabetes mellitus with hyperglycemia: Secondary | ICD-10-CM

## 2015-08-30 DIAGNOSIS — IMO0002 Reserved for concepts with insufficient information to code with codable children: Secondary | ICD-10-CM

## 2015-08-30 DIAGNOSIS — N182 Chronic kidney disease, stage 2 (mild): Secondary | ICD-10-CM

## 2015-08-30 DIAGNOSIS — Z794 Long term (current) use of insulin: Secondary | ICD-10-CM

## 2015-08-30 LAB — BASIC METABOLIC PANEL WITH GFR
BUN: 11 mg/dL (ref 6–23)
CO2: 31 meq/L (ref 19–32)
Calcium: 9.3 mg/dL (ref 8.4–10.5)
Chloride: 103 meq/L (ref 96–112)
Creatinine, Ser: 1.23 mg/dL — ABNORMAL HIGH (ref 0.40–1.20)
GFR: 56.69 mL/min — ABNORMAL LOW
Glucose, Bld: 250 mg/dL — ABNORMAL HIGH (ref 70–99)
Potassium: 3.9 meq/L (ref 3.5–5.1)
Sodium: 139 meq/L (ref 135–145)

## 2015-08-30 LAB — MICROALBUMIN / CREATININE URINE RATIO
CREATININE, U: 293 mg/dL
MICROALB UR: 7.2 mg/dL — AB (ref 0.0–1.9)
Microalb Creat Ratio: 2.5 mg/g (ref 0.0–30.0)

## 2015-08-30 LAB — HEMOGLOBIN A1C: HEMOGLOBIN A1C: 7.8 % — AB (ref 4.6–6.5)

## 2015-09-01 ENCOUNTER — Encounter: Payer: Self-pay | Admitting: Endocrinology

## 2015-09-01 ENCOUNTER — Ambulatory Visit (INDEPENDENT_AMBULATORY_CARE_PROVIDER_SITE_OTHER): Payer: Medicare Other | Admitting: Endocrinology

## 2015-09-01 VITALS — BP 136/72 | HR 85 | Temp 98.2°F | Resp 14 | Ht 64.0 in | Wt 196.6 lb

## 2015-09-01 DIAGNOSIS — I10 Essential (primary) hypertension: Secondary | ICD-10-CM

## 2015-09-01 DIAGNOSIS — N182 Chronic kidney disease, stage 2 (mild): Secondary | ICD-10-CM | POA: Diagnosis not present

## 2015-09-01 DIAGNOSIS — E1165 Type 2 diabetes mellitus with hyperglycemia: Secondary | ICD-10-CM | POA: Diagnosis not present

## 2015-09-01 DIAGNOSIS — Z794 Long term (current) use of insulin: Secondary | ICD-10-CM | POA: Diagnosis not present

## 2015-09-01 MED ORDER — METFORMIN HCL ER 500 MG PO TB24: 1500.0000 mg | ORAL_TABLET | Freq: Every day | ORAL | Status: DC

## 2015-09-01 NOTE — Patient Instructions (Addendum)
Start taking Metformin 500 mg, 1 tablet with your main meal for 5 days. Occasionally this may initially cause loose stools or nausea.  If  tolerating well after 5 days add a second Metformin tablet (500 mg) at the same time.  Continue adding another tablet after 5 days days if no persistent nausea or diarrhea until reaching the maximum tolerated dose or the full dose of 3 tablets once daily.  Call if sugars get low or not able to take it

## 2015-09-01 NOTE — Progress Notes (Signed)
Patient ID: [name], female   DOB: April 29, 1951, 65 y.o.   MRN: [id number]   Reason for Appointment: Diabetes follow-up   History of Present Illness   Diagnosis: Type 2, date of diagnosis:1985.    PAST history: She has had difficult to control diabetes requiring large doses of insulin especially at meal times. Because of poor control she was switched from NovoLog to U-500 insulin in 11/2012. At that time her A1c was 9%  Initially seemed to have better blood sugars. With no clear response with Invokana this was stopped and she was started on Actos. Her insulin was adjusted with higher mealtime coverage at breakfast and lunch and lower amounts at supper. Lantus requirement appeared to be more in the morning and evening dose was eventually stopped. She was on Victoza and Invokana later in an attempt to improve her sugars but with only limited success.  Because of continued poor control with subcutaneous insulin and large insulin requirement she was switched to a Medtronic insulin pump which she started on 07/29/13 Prior to starting insulin pump her blood sugar at home was averaging 273 With the U 500 insulin in the pump her blood sugars were dramatically better with rare hypoglycemia  She was switched to NovoLog insulin in her pump instead of U-500 insulin in 3/15 because of  episode of severe overnight hypoglycemia  RECENT history:   Pump settings  Basal rate midnight = 0.7, 3 AM = 0.6, 6 AM = 2.35, 11 AM = 4.0, 3 PM = 2.2 and 9 PM = 3.0, daily total 53 units Carbohydrate coverage 1:5, correction 1: 40 and target 110-120, insulin duration 4 hours Total daily insulin 71 units +/-5  Her most recent A1c is 7.8, previously 7.7 and she still requires relatively large amounts of basal insulin on her pump  Current blood sugar patterns and problems:   Has her first blood sugar reading in the morning  is relatively late in the day and again it is mostly high  Blood sugars appear  to be progressively lower during the day and the lowest at bedtime without hypoglycemia  Postprandial readings are usually improved compared to pre-meal readings which are usually higher at her lunch and dinner meals  She is continuing to gain significant amount of weight despite exercising and she does not know why  She did have a few readings over 200 over the holidays when she went off her diet  Usually not overriding her bolus wizard and has at least 2 boluses a day and no manual boluses   HOME glucose readings for the last 2 weeks from pump download:  Mean values apply above for all meters except median for One Touch  PRE-MEAL Fasting Lunch Dinner Bedtime Overall  Glucose range:  184-271    113-251   79-161    Mean/median: 214    174   159 178+/-52     Meals: 2-3 meals per day, at 11 am, 3 pm and 7 pm Food preferences: She has oatmeal, toast frequently with egg for protein, usually has a sandwich and salad for lunch and vegetables for dinner with fish or lean meat for protein. Will snack on apples, raisons or wheat thins.   Wt Readings from Last 3 Encounters:  09/01/15 196 lb 9.6 oz (89.177 kg)  06/01/15 185 lb 12.8 oz (84.278 kg)  02/11/15 174 lb (78.926 kg)   Physical activity: exercise: at Gym for 90 min, 4/7 days at 1 pm;  Dietician visit:  Most recent: 2012    Lab Results  Component Value Date   HGBA1C 7.8* 08/30/2015   HGBA1C 7.7* 05/25/2015   HGBA1C 7.9* 01/18/2015   Lab Results  Component Value Date   MICROALBUR 7.2* 08/30/2015   LDLCALC 96 05/25/2015   CREATININE 1.23* 08/30/2015       Medication List       This list is accurate as of: 09/01/15  2:38 PM.  Always use your most recent med list.               ADVAIR DISKUS 250-50 MCG/DOSE Aepb  Generic drug:  Fluticasone-Salmeterol  Inhale 1 puff into the lungs 2 (two) times daily as needed (for shortness of breath).     amLODipine 10 MG tablet  Commonly known as:  NORVASC  Take 10 mg by mouth  every evening.     baclofen 10 MG tablet  Commonly known as:  LIORESAL  Take 1 tablet (10 mg total) by mouth 3 (three) times daily. As needed for muscle spasm     BAYER CONTOUR NEXT TEST test strip  Generic drug:  glucose blood  use AS INSTRUCTED TO CHECK BLOOD SUGARS 7 TIMES A DAY     BAYER MICROLET LANCETS lancets  Use as instructed to check blood sugar 7 times per day dx code E11.65     docusate sodium 100 MG capsule  Commonly known as:  COLACE  Take 100 mg by mouth 2 (two) times daily as needed for mild constipation.     EPIPEN 2-PAK 0.3 mg/0.3 mL Soaj injection  Generic drug:  EPINEPHrine  Inject 0.3 mg into the muscle once.     gabapentin 300 MG capsule  Commonly known as:  NEURONTIN  Take 1 capsule (300 mg total) by mouth 3 (three) times daily.     glucagon 1 MG injection  Commonly known as:  GLUCAGON EMERGENCY  Inject 1 mg into the vein once as needed (for low blood sugar).     hydrOXYzine 25 MG tablet  Commonly known as:  ATARAX/VISTARIL     insulin lispro 100 UNIT/ML injection  Commonly known as:  HUMALOG  Use max 60 units per day with insulin pump     Liraglutide 18 MG/3ML Sopn  Commonly known as:  VICTOZA  Inject 1.2 mg daily     losartan-hydrochlorothiazide 100-25 MG tablet  Commonly known as:  HYZAAR  Take 2 tablets by mouth daily.     metFORMIN 500 MG 24 hr tablet  Commonly known as:  GLUCOPHAGE-XR  Take 3 tablets (1,500 mg total) by mouth daily with supper.     ondansetron 4 MG tablet  Commonly known as:  ZOFRAN  Take 1 tablet (4 mg total) by mouth every 8 (eight) hours as needed for nausea or vomiting.     pantoprazole 40 MG tablet  Commonly known as:  PROTONIX  Take 40 mg by mouth every evening.     PNEUMOVAX 23 25 MCG/0.5ML injection  Generic drug:  pneumococcal 23 valent vaccine     pravastatin 80 MG tablet  Commonly known as:  PRAVACHOL  Take 1 tablet (80 mg total) by mouth every morning.     rivaroxaban 10 MG Tabs tablet  Commonly  known as:  XARELTO  Take 1 tablet (10 mg total) by mouth daily.     sennosides-docusate sodium 8.6-50 MG tablet  Commonly known as:  SENOKOT-S  Take 2 tablets by mouth daily.     solifenacin 5 MG tablet  Commonly known as:  VESICARE  Take 5 mg by mouth every morning.     spironolactone 50 MG tablet  Commonly known as:  ALDACTONE  Take 1 tablet (50 mg total) by mouth daily.     trimethoprim 100 MG tablet  Commonly known as:  TRIMPEX  Take 100 mg by mouth daily.        Allergies:  Allergies  Allergen Reactions  . Codeine Itching and Nausea And Vomiting    Past Medical History  Diagnosis Date  . Hypertension   . Fibromyalgia   . Hyperlipidemia   . SUI (stress urinary incontinence, female)   . Diabetes mellitus type 2, insulin dependent ([place])   . Insulin pump in place     since 12/ 2014--  [facility]  . GERD (gastroesophageal reflux disease)   . H/O hiatal hernia   . History of esophageal dilatation   . Renal insufficiency   . History of rhabdomyolysis     03/ 2015  . Asthma   . Wears glasses   . Full dentures   . Headache     h/o migraines - was followed for a time with a wellness doctor  . Arthritis     L knee, hands, back   . Osteoarthritis of left knee, primary localized 08/17/2014    Past Surgical History  Procedure Laterality Date  . Carpal tunnel release Right 2000  . Knee arthroscopy Right 1989  &  2002  . Excision left upper arm mass  01-29-2014  . Cardiac catheterization  07-20-2009   DR [place]  NORMAL  LVEF/  NORMAL CORONARY AND RENAL ARTERIES  . Colonoscopy with esophagogastroduodenoscopy (egd)  06-18-2002  . Esophagogastroduodenoscopy (egd) with esophageal dilation  06-12-2010  . Cholecystectomy  1990  . Negative sleep study  2012    PER PT  . Bladder suspension N/A 03/09/2014    Procedure: CYSTOSCOPY/SLING;  Surgeon: Reece Packer, MD;  [facility]: [facility];  Service: Urology;  Laterality: N/A;  .  Eye surgery  2010    laser on R eye  . Partial knee arthroplasty Left 08/17/2014    Procedure: LEFT UNICOMPARTMENTAL KNEE;  Surgeon: Johnny Bridge, MD;  Location: [place];  Service: Orthopedics;  Laterality: Left;    Family History  Problem Relation Age of Onset  . Diabetes Mother   . Hypertension Mother   . Cancer Mother   . Diabetes Father   . Hypertension Father   . Diabetes Brother     Social History:  reports that she has never smoked. She has never used smokeless tobacco. She reports that she does not drink alcohol or use illicit drugs.  Review of Systems    Creatinine was as high as  1.6 previously and improved recently   Lab Results  Component Value Date   CREATININE 1.23* 08/30/2015    HYPERTENSION:  This is better controlled, has also been followed by PCP. She is taking amlodipine, losartan HCTZ and Aldactone Aldactone has been increased to 100 mg on  for better blood pressure control   Hypokalemia: none.  She is on Aldactone and HCTZ    Lab Results  Component Value Date   CREATININE 1.23* 08/30/2015   BUN 11 08/30/2015   NA 139 08/30/2015   K 3.9 08/30/2015   CL 103 08/30/2015   CO2 31 08/30/2015     HYPERLIPIDEMIA: Calculated LDL is at goal with using  pravastatin  mg  Lab Results  Component Value Date   CHOL 159 05/25/2015   HDL 47.50 05/25/2015   LDLCALC 96 05/25/2015   LDLDIRECT 152.8 10/09/2013   TRIG 78.0 05/25/2015   CHOLHDL 3 05/25/2015    NEUROPATHY: She is being treated with gabapentin for paresthesiae in feet, last foot exam in 10/16     Examination:   BP 136/72 mmHg  Pulse 85  Temp(Src) 98.2 F (36.8 C)  Resp 14  Ht 5\' 4"  (1.626 m)  Wt 196 lb 9.6 oz (89.177 kg)  BMI 33.73 kg/m2  SpO2 98%  Body mass index is 33.73 kg/(m^2).     Assesment/PLAN:   Diabetes type 2, uncontrolled  See history of present illness for detailed discussion of her current management, problems identified and blood sugar patterns Her blood  sugars are overall about the same with A1c 7.8 However she is getting consistently high readings fasting and they are higher than the last visit She has also gained weight Previously apparently had not benefited much from [place] She thinks she had nausea with metformin several years ago but not clear which preparation she was taking and how much  Current blood sugar patterns indicate improving blood sugars in the afternoons and evenings and relatively lower readings postprandially For now will increase her basal rates early morning and not change the rest of the day Changes = 3 AM is 0.7 and 6 AM = 2.50  She will use a carbohydrate coverage of 1:6 Trial of metformin ER in progressively increasing doses as discussed, discussed benefits of metformin If not tolerated will try Invokana again Continue regular exercise  Hypertension: This is now well controlled with current regimen especially with increasing her Aldactone to 100 mg Her potassium is stable at 3.9    Counseling time on subjects discussed above is over 50% of today's 25 minute visit   Twilia Yaklin 09/01/2015, 2:38 PM

## 2015-09-21 DIAGNOSIS — E1165 Type 2 diabetes mellitus with hyperglycemia: Secondary | ICD-10-CM | POA: Diagnosis not present

## 2015-09-23 DIAGNOSIS — D519 Vitamin B12 deficiency anemia, unspecified: Secondary | ICD-10-CM | POA: Diagnosis not present

## 2015-09-23 DIAGNOSIS — R5383 Other fatigue: Secondary | ICD-10-CM | POA: Diagnosis not present

## 2015-09-23 DIAGNOSIS — I10 Essential (primary) hypertension: Secondary | ICD-10-CM | POA: Diagnosis not present

## 2015-09-23 DIAGNOSIS — R0601 Orthopnea: Secondary | ICD-10-CM | POA: Diagnosis not present

## 2015-09-30 ENCOUNTER — Other Ambulatory Visit (HOSPITAL_COMMUNITY): Payer: Self-pay | Admitting: Internal Medicine

## 2015-09-30 DIAGNOSIS — R5383 Other fatigue: Secondary | ICD-10-CM

## 2015-10-13 ENCOUNTER — Ambulatory Visit (INDEPENDENT_AMBULATORY_CARE_PROVIDER_SITE_OTHER): Payer: Medicare Other | Admitting: Endocrinology

## 2015-10-13 ENCOUNTER — Encounter: Payer: Self-pay | Admitting: Endocrinology

## 2015-10-13 VITALS — BP 146/84 | HR 85 | Temp 98.4°F | Resp 14 | Ht 64.0 in | Wt 202.6 lb

## 2015-10-13 DIAGNOSIS — Z794 Long term (current) use of insulin: Secondary | ICD-10-CM | POA: Diagnosis not present

## 2015-10-13 DIAGNOSIS — E1165 Type 2 diabetes mellitus with hyperglycemia: Secondary | ICD-10-CM

## 2015-10-13 MED ORDER — CANAGLIFLOZIN 300 MG PO TABS: 300.0000 mg | ORAL_TABLET | Freq: Every day | ORAL | Status: DC

## 2015-10-13 NOTE — Progress Notes (Signed)
Patient ID: [name], female   DOB: 20-Sep-1950, 65 y.o.   MRN: [id number]   Reason for Appointment: Diabetes follow-up   History of Present Illness   Diagnosis: Type 2, date of diagnosis:1985.    PAST history: She has had difficult to control diabetes requiring large doses of insulin especially at meal times. Because of poor control she was switched from NovoLog to U-500 insulin in 11/2012. At that time her A1c was 9%  Initially seemed to have better blood sugars. With no clear response with Invokana this was stopped and she was started on Actos. Her insulin was adjusted with higher mealtime coverage at breakfast and lunch and lower amounts at supper. Lantus requirement appeared to be more in the morning and evening dose was eventually stopped. She was on Victoza and Invokana later in an attempt to improve her sugars but with only limited success.  Because of continued poor control with subcutaneous insulin and large insulin requirement she was switched to a Medtronic insulin pump which she started on 07/29/13 Prior to starting insulin pump her blood sugar at home was averaging 273 With the U 500 insulin in the pump her blood sugars were dramatically better with rare hypoglycemia  She was switched to NovoLog insulin in her pump instead of U-500 insulin in 3/15 because of  episode of severe overnight hypoglycemia  RECENT history:   Pump settings  Basal rate midnight = 0.7, 3 AM = 0.7, 6 AM = 2.5, 11 AM = 4.0, 3 PM = 2.2 and 9 PM = 3.0, daily total 53 units Carbohydrate coverage 1:5, correction 1: 40 and target 110-120, insulin duration 4 hours Total daily insulin 71 units +/-5  Her most recent A1c is 7.8, previously 7.7  Because of her large insulin requirement and tendency to weight gain she was given a trial of metformin on her visit in 1/17 Apparently her glucose was done 60 the first day after starting it but not low subsequently  Current blood sugar patterns and  problems:   FASTING blood sugars are still mostly high on an average better than her previous visit  Usually not having any high postprandial readings of current boluses  Sporadically has near normal blood sugars the rest of the day especially 6-11 PM   She is still continues to exercise at least a every week, now trying to do it at 10 AM in the morning  Overall blood sugars are somewhat better towards the evening but still mostly high before supper  Has her first blood sugar reading in the morning  is relatively late in the day and again it is mostly high  Still watching her fat intake quite well carbohydrate intake ranges between 50-140 g per day but on average 83 per day  HOME glucose readings for the last 2 weeks from pump download:  Mean values apply above for all meters except median for One Touch  PRE-MEAL Fasting Lunch Dinner Bedtime Overall  Glucose range:  116-2 87   106-275   77-215   91-393    Mean/median: 195   173   171 +/-55    POST-MEAL PC Breakfast PC Lunch PC Dinner  Glucose range:  99-215     Mean/median:    145      Meals: 2-3 meals per day, at 11 am, 3 pm and 7 pm Food preferences: She has oatmeal, toast frequently with egg for protein, usually has a sandwich and salad for lunch and vegetables for  dinner with fish or lean meat for protein. Will snack on apples, raisons or wheat thins.   Wt Readings from Last 3 Encounters:  10/13/15 202 lb 9.6 oz (91.899 kg)  09/01/15 196 lb 9.6 oz (89.177 kg)  06/01/15 185 lb 12.8 oz (84.278 kg)   Physical activity: exercise: at Gym for 90 min, 4/7 days at 10 am recently;  Dietician visit: Most recent: 2012    Lab Results  Component Value Date   HGBA1C 7.8* 08/30/2015   HGBA1C 7.7* 05/25/2015   HGBA1C 7.9* 01/18/2015   Lab Results  Component Value Date   MICROALBUR 7.2* 08/30/2015   LDLCALC 96 05/25/2015   CREATININE 1.23* 08/30/2015       Medication List       This list is accurate as of: 10/13/15   5:06 PM.  Always use your most recent med list.               ADVAIR DISKUS 250-50 MCG/DOSE Aepb  Generic drug:  Fluticasone-Salmeterol  Inhale 1 puff into the lungs 2 (two) times daily as needed (for shortness of breath).     amLODipine 10 MG tablet  Commonly known as:  NORVASC  Take 10 mg by mouth every evening.     baclofen 10 MG tablet  Commonly known as:  LIORESAL  Take 1 tablet (10 mg total) by mouth 3 (three) times daily. As needed for muscle spasm     BAYER CONTOUR NEXT TEST test strip  Generic drug:  glucose blood  use AS INSTRUCTED TO CHECK BLOOD SUGARS 7 TIMES A DAY     BAYER MICROLET LANCETS lancets  Use as instructed to check blood sugar 7 times per day dx code E11.65     canagliflozin 300 MG Tabs tablet  Commonly known as:  INVOKANA  Take 1 tablet (300 mg total) by mouth daily before breakfast.     docusate sodium 100 MG capsule  Commonly known as:  COLACE  Take 100 mg by mouth 2 (two) times daily as needed for mild constipation.     EPIPEN 2-PAK 0.3 mg/0.3 mL Soaj injection  Generic drug:  EPINEPHrine  Inject 0.3 mg into the muscle once.     gabapentin 300 MG capsule  Commonly known as:  NEURONTIN  Take 1 capsule (300 mg total) by mouth 3 (three) times daily.     glucagon 1 MG injection  Commonly known as:  GLUCAGON EMERGENCY  Inject 1 mg into the vein once as needed (for low blood sugar).     hydrOXYzine 25 MG tablet  Commonly known as:  ATARAX/VISTARIL     insulin lispro 100 UNIT/ML injection  Commonly known as:  HUMALOG  Use max 60 units per day with insulin pump     Liraglutide 18 MG/3ML Sopn  Commonly known as:  VICTOZA  Inject 1.2 mg daily     losartan-hydrochlorothiazide 100-25 MG tablet  Commonly known as:  HYZAAR  Take 2 tablets by mouth daily.     metFORMIN 500 MG 24 hr tablet  Commonly known as:  GLUCOPHAGE-XR  Take 3 tablets (1,500 mg total) by mouth daily with supper.     ondansetron 4 MG tablet  Commonly known as:  ZOFRAN    Take 1 tablet (4 mg total) by mouth every 8 (eight) hours as needed for nausea or vomiting.     pantoprazole 40 MG tablet  Commonly known as:  PROTONIX  Take 40 mg by mouth every evening.  PNEUMOVAX 23 25 MCG/0.5ML injection  Generic drug:  pneumococcal 23 valent vaccine     pravastatin 80 MG tablet  Commonly known as:  PRAVACHOL  Take 1 tablet (80 mg total) by mouth every morning.     rivaroxaban 10 MG Tabs tablet  Commonly known as:  XARELTO  Take 1 tablet (10 mg total) by mouth daily.     sennosides-docusate sodium 8.6-50 MG tablet  Commonly known as:  SENOKOT-S  Take 2 tablets by mouth daily.     solifenacin 5 MG tablet  Commonly known as:  VESICARE  Take 5 mg by mouth every morning.     spironolactone 50 MG tablet  Commonly known as:  ALDACTONE  Take 1 tablet (50 mg total) by mouth daily.     trimethoprim 100 MG tablet  Commonly known as:  TRIMPEX  Take 100 mg by mouth daily.        Allergies:  Allergies  Allergen Reactions  . Codeine Itching and Nausea And Vomiting    Past Medical History  Diagnosis Date  . Hypertension   . Fibromyalgia   . Hyperlipidemia   . SUI (stress urinary incontinence, female)   . Diabetes mellitus type 2, insulin dependent ([place])   . Insulin pump in place     since 12/ 2014--  [facility]  . GERD (gastroesophageal reflux disease)   . H/O hiatal hernia   . History of esophageal dilatation   . Renal insufficiency   . History of rhabdomyolysis     03/ 2015  . Asthma   . Wears glasses   . Full dentures   . Headache     h/o migraines - was followed for a time with a wellness doctor  . Arthritis     L knee, hands, back   . Osteoarthritis of left knee, primary localized 08/17/2014    Past Surgical History  Procedure Laterality Date  . Carpal tunnel release Right 2000  . Knee arthroscopy Right 1989  &  2002  . Excision left upper arm mass  01-29-2014  . Cardiac catheterization  07-20-2009   DR [place]  NORMAL  LVEF/  NORMAL CORONARY AND RENAL ARTERIES  . Colonoscopy with esophagogastroduodenoscopy (egd)  06-18-2002  . Esophagogastroduodenoscopy (egd) with esophageal dilation  06-12-2010  . Cholecystectomy  1990  . Negative sleep study  2012    PER PT  . Bladder suspension N/A 03/09/2014    Procedure: CYSTOSCOPY/SLING;  Surgeon: Reece Packer, MD;  Location: [facility];  Service: Urology;  Laterality: N/A;  . Eye surgery  2010    laser on R eye  . Partial knee arthroplasty Left 08/17/2014    Procedure: LEFT UNICOMPARTMENTAL KNEE;  Surgeon: Johnny Bridge, MD;  Location: [place];  Service: Orthopedics;  Laterality: Left;    Family History  Problem Relation Age of Onset  . Diabetes Mother   . Hypertension Mother   . Cancer Mother   . Diabetes Father   . Hypertension Father   . Diabetes Brother     Social History:  reports that she has never smoked. She has never used smokeless tobacco. She reports that she does not drink alcohol or use illicit drugs.  Review of Systems    Creatinine was as high as  1.6 previously and improved     Lab Results  Component Value Date   CREATININE 1.23* 08/30/2015    HYPERTENSION:  This is better controlled, has also  been followed by PCP. She is taking amlodipine, losartan HCTZ and Aldactone  She was supposed to be in 100 mg of Aldactone but is taking only 50 mg  BP at home [id number] systolic recently    Hypokalemia: none.  She is on Aldactone and HCTZ    Lab Results  Component Value Date   CREATININE 1.23* 08/30/2015   BUN 11 08/30/2015   NA 139 08/30/2015   K 3.9 08/30/2015   CL 103 08/30/2015   CO2 31 08/30/2015     HYPERLIPIDEMIA:  LDL is at goal with using  pravastatin 80 mg    Lab Results  Component Value Date   CHOL 159 05/25/2015   HDL 47.50 05/25/2015   LDLCALC 96 05/25/2015   LDLDIRECT 152.8 10/09/2013   TRIG 78.0 05/25/2015   CHOLHDL 3 05/25/2015    NEUROPATHY: She is being treated with  gabapentin for paresthesiae in feet, last foot exam in 10/16     Examination:   BP 146/84 mmHg  Pulse 85  Temp(Src) 98.4 F (36.9 C)  Resp 14  Ht 5\' 4"  (1.626 m)  Wt 202 lb 9.6 oz (91.899 kg)  BMI 34.76 kg/m2  SpO2 96%  Body mass index is 34.76 kg/(m^2).     Assesment/PLAN:   Diabetes type 2, uncontrolled  See history of present illness for detailed discussion of her current management, problems identified and blood sugar patterns Her blood sugars are overall about the same with her average readings similar to her last month visit However she is not getting as many consistently high fasting readings with metformin  Surprisingly is not losing weight even with using Victoza, metformin and generally doing well with diet and exercise She is most concerned about her continued weight gain and is the most that she has weighed   Discussed that the only option left for her is to add Invokana back in to see to see if it will help her weight, reduce insulin requirement  She will start with a half tablet of 300 mg and then full tablet after a week  She will call if she has any hypoglycemia  She will increase her Victoza to 1.8 mg  Also increase metformin to 2000 mg of combining with Invokana  Hypertension: This is now fairly well controlled with current regimen Since her blood pressure may come down significantly with Invokana will reduce her losartan HCT by half Her potassium is stable at 3.9 and will need to be rechecked again   Counseling time on subjects discussed above is over 50% of today's 25 minute visit   Sherine Cortese 10/13/2015, 5:06 PM

## 2015-10-13 NOTE — Patient Instructions (Signed)
Victoza 1.8mg  daily  Invokana 1/2 pill for 5 days then 1 daily  Take only 1/2 Losartan

## 2015-10-17 ENCOUNTER — Other Ambulatory Visit: Payer: Self-pay | Admitting: *Deleted

## 2015-10-17 ENCOUNTER — Other Ambulatory Visit: Payer: Self-pay

## 2015-10-17 ENCOUNTER — Ambulatory Visit (HOSPITAL_COMMUNITY): Payer: Medicare Other | Attending: Cardiovascular Disease

## 2015-10-17 DIAGNOSIS — I350 Nonrheumatic aortic (valve) stenosis: Secondary | ICD-10-CM | POA: Diagnosis not present

## 2015-10-17 DIAGNOSIS — I10 Essential (primary) hypertension: Secondary | ICD-10-CM

## 2015-10-17 DIAGNOSIS — E119 Type 2 diabetes mellitus without complications: Secondary | ICD-10-CM | POA: Insufficient documentation

## 2015-10-17 DIAGNOSIS — R5383 Other fatigue: Secondary | ICD-10-CM | POA: Diagnosis not present

## 2015-10-17 DIAGNOSIS — I119 Hypertensive heart disease without heart failure: Secondary | ICD-10-CM | POA: Diagnosis not present

## 2015-10-17 MED ORDER — GABAPENTIN 300 MG PO CAPS: 300.0000 mg | ORAL_CAPSULE | Freq: Three times a day (TID) | ORAL | Status: DC

## 2015-10-24 DIAGNOSIS — R0600 Dyspnea, unspecified: Secondary | ICD-10-CM | POA: Diagnosis not present

## 2015-10-24 DIAGNOSIS — R5383 Other fatigue: Secondary | ICD-10-CM | POA: Diagnosis not present

## 2015-10-24 DIAGNOSIS — D519 Vitamin B12 deficiency anemia, unspecified: Secondary | ICD-10-CM | POA: Diagnosis not present

## 2015-10-24 DIAGNOSIS — I10 Essential (primary) hypertension: Secondary | ICD-10-CM | POA: Diagnosis not present

## 2015-10-25 ENCOUNTER — Other Ambulatory Visit: Payer: Self-pay | Admitting: Nurse Practitioner

## 2015-10-25 ENCOUNTER — Telehealth: Payer: Self-pay | Admitting: Endocrinology

## 2015-10-25 ENCOUNTER — Ambulatory Visit
Admission: RE | Admit: 2015-10-25 | Discharge: 2015-10-25 | Disposition: A | Discharge status: Home / Self Care | Payer: Medicare Other | Source: Ambulatory Visit | Attending: Internal Medicine | Admitting: Internal Medicine

## 2015-10-25 ENCOUNTER — Other Ambulatory Visit: Payer: Self-pay | Admitting: Endocrinology

## 2015-10-25 DIAGNOSIS — R0600 Dyspnea, unspecified: Secondary | ICD-10-CM

## 2015-10-25 DIAGNOSIS — R5382 Chronic fatigue, unspecified: Secondary | ICD-10-CM

## 2015-10-25 DIAGNOSIS — R0602 Shortness of breath: Secondary | ICD-10-CM | POA: Diagnosis not present

## 2015-10-25 DIAGNOSIS — D519 Vitamin B12 deficiency anemia, unspecified: Secondary | ICD-10-CM | POA: Diagnosis not present

## 2015-10-25 DIAGNOSIS — I10 Essential (primary) hypertension: Secondary | ICD-10-CM | POA: Diagnosis not present

## 2015-10-25 NOTE — Telephone Encounter (Signed)
Pt is on the last vial right now of the humalog please call in soon to [facility]

## 2015-10-26 NOTE — Telephone Encounter (Signed)
rx sent

## 2015-11-04 ENCOUNTER — Other Ambulatory Visit: Payer: Self-pay | Admitting: Nurse Practitioner

## 2015-11-04 ENCOUNTER — Other Ambulatory Visit: Payer: Self-pay | Admitting: Internal Medicine

## 2015-11-04 DIAGNOSIS — R911 Solitary pulmonary nodule: Secondary | ICD-10-CM

## 2015-11-07 ENCOUNTER — Encounter: Payer: Self-pay | Admitting: Cardiovascular Disease

## 2015-11-07 ENCOUNTER — Telehealth: Payer: Self-pay | Admitting: Cardiovascular Disease

## 2015-11-07 ENCOUNTER — Ambulatory Visit (INDEPENDENT_AMBULATORY_CARE_PROVIDER_SITE_OTHER): Payer: Medicare Other | Admitting: Cardiovascular Disease

## 2015-11-07 VITALS — BP 164/84 | HR 80 | Ht 64.0 in | Wt 197.3 lb

## 2015-11-07 DIAGNOSIS — E785 Hyperlipidemia, unspecified: Secondary | ICD-10-CM

## 2015-11-07 DIAGNOSIS — I10 Essential (primary) hypertension: Secondary | ICD-10-CM | POA: Diagnosis not present

## 2015-11-07 DIAGNOSIS — E669 Obesity, unspecified: Secondary | ICD-10-CM

## 2015-11-07 DIAGNOSIS — R079 Chest pain, unspecified: Secondary | ICD-10-CM

## 2015-11-07 MED ORDER — METOPROLOL TARTRATE 25 MG PO TABS: 25.0000 mg | ORAL_TABLET | Freq: Two times a day (BID) | ORAL | Status: DC

## 2015-11-07 NOTE — Telephone Encounter (Signed)
Received records from [facility] for appointment on 11/07/15 with Dr Oval Linsey.  Records given to [facility] (medical records) for Dr Blenda Mounts schedule on 11/07/15. lp

## 2015-11-07 NOTE — Telephone Encounter (Signed)
Close Encounter 

## 2015-11-07 NOTE — Progress Notes (Signed)
Cardiology Office Note   Date:  11/07/2015   ID:  [name], [name] 23-Jul-1951, MRN [id number]  PCP:  Maximino Greenland, MD  Cardiologist:   Sharol Harness, MD   Chief Complaint  Patient presents with  . New Patient (Initial Visit)     chest pain, shortness of breath, edema, pain or cramping in legs, lightheadedness or dizziness, fatigue      History of Present Illness: [name] is a 65 y.o. female with hypertension, hyperlipidemia, asthma, diabetes mellitus type 2 who presents for an evaluation of chest pain and shortness of breath.  She has noted 4-5 years of intermittent chest pain.  It happens approximately two times per month and is worse when she goes from laying down to sitiing up or when walking.  She gets short of breath when it occurs.  The pain is typically a sharp, stabbing pain lasting 1-2 minutes.  There is associated nausea and diaphoresis. She notes some orthopnea but denies PND.  [name] has also noted swelling in bilateral legs over the last month.  She also notes exertional dyspena.  After trying to clean her home she gets extremely fatiged and has to stop.  She used to go to the [facility] and exercise 5 times per week but now gets tired after a few minutes of exercise.  [name] also noted fatigue.  She feels tired upon awakening in the morning.  She has a long-standing history insomnia and states that "if I get 3 hours of sleep its a good night."  She had a sleep study that was negative.  She notes that her BP has been running high at home.  It is typically in the 160s/90s.  She takes her meds as prescribed.   She has been trying to eat healthy.  She avoids fried foods and gets a low fiber.    Past Medical History  Diagnosis Date  . Hypertension   . Fibromyalgia   . Hyperlipidemia   . SUI (stress urinary incontinence, female)   . Diabetes mellitus type 2, insulin dependent ([place])   . Insulin pump in place     since 12/ 2014--  [employer]    . GERD (gastroesophageal reflux disease)   . H/O hiatal hernia   . History of esophageal dilatation   . Renal insufficiency   . History of rhabdomyolysis     03/ 2015  . Asthma   . Wears glasses   . Full dentures   . Headache     h/o migraines - was followed for a time with a wellness doctor  . Arthritis     L knee, hands, back   . Osteoarthritis of left knee, primary localized 08/17/2014    Past Surgical History  Procedure Laterality Date  . Carpal tunnel release Right 2000  . Knee arthroscopy Right 1989  &  2002  . Excision left upper arm mass  01-29-2014  . Cardiac catheterization  07-20-2009   DR Lyford  NORMAL  LVEF/  NORMAL CORONARY AND RENAL ARTERIES  . Colonoscopy with esophagogastroduodenoscopy (egd)  06-18-2002  . Esophagogastroduodenoscopy (egd) with esophageal dilation  06-12-2010  . Cholecystectomy  1990  . Negative sleep study  2012    PER PT  . Bladder suspension N/A 03/09/2014    Procedure: CYSTOSCOPY/SLING;  Surgeon: Reece Packer, MD;  Location: [facility];  Service: Urology;  Laterality: N/A;  . Eye surgery  2010  laser on R eye  . Partial knee arthroplasty Left 08/17/2014    Procedure: LEFT UNICOMPARTMENTAL KNEE;  Surgeon: Johnny Bridge, MD;  Location: [place];  Service: Orthopedics;  Laterality: Left;    Current Outpatient Prescriptions  Medication Sig Dispense Refill  . ADVAIR DISKUS 250-50 MCG/DOSE AEPB Inhale 1 puff into the lungs 2 (two) times daily as needed (for shortness of breath).     [name] amLODipine (NORVASC) 10 MG tablet Take 10 mg by mouth every evening.     [name] BAYER CONTOUR NEXT TEST test strip use AS INSTRUCTED TO CHECK BLOOD SUGARS 7 TIMES A DAY 300 each 5  . BAYER MICROLET LANCETS lancets Use as instructed to check blood sugar 7 times per day dx code E11.65 300 each 5  . canagliflozin (INVOKANA) 300 MG TABS tablet Take 1 tablet (300 mg total) by mouth daily before breakfast. 30 tablet 3  .  docusate sodium (COLACE) 100 MG capsule Take 100 mg by mouth 2 (two) times daily as needed for mild constipation.    . gabapentin (NEURONTIN) 300 MG capsule Take 1 capsule (300 mg total) by mouth 3 (three) times daily. 90 capsule 3  . glucagon (GLUCAGON EMERGENCY) 1 MG injection Inject 1 mg into the vein once as needed (for low blood sugar). 1 each 12  . HUMALOG 100 UNIT/ML injection USE MAX 60 UNITS PER DAY WITH INSULIN PUMP 20 mL 5  . hydrOXYzine (ATARAX/VISTARIL) 25 MG tablet   0  . Liraglutide (VICTOZA) 18 MG/3ML SOPN Inject 1.2 mg daily 18 mL 1  . losartan-hydrochlorothiazide (HYZAAR) 100-25 MG per tablet Take 2 tablets by mouth daily.     . metFORMIN (GLUCOPHAGE-XR) 500 MG 24 hr tablet Take 3 tablets (1,500 mg total) by mouth daily with supper. 90 tablet 3  . metoprolol tartrate (LOPRESSOR) 25 MG tablet Take 1 tablet (25 mg total) by mouth 2 (two) times daily. 60 tablet 3  . nitrofurantoin (MACRODANTIN) 100 MG capsule Take 1 capsule by mouth daily. Take 1 cap by mouth daily  1  . oxybutynin (DITROPAN-XL) 10 MG 24 hr tablet Take 1 tablet by mouth daily. Take 1 tab by mouth daily  1  . pantoprazole (PROTONIX) 40 MG tablet Take 40 mg by mouth every evening.    [name] PNEUMOVAX 23 25 MCG/0.5ML injection   0  . pravastatin (PRAVACHOL) 80 MG tablet Take 1 tablet (80 mg total) by mouth every morning. 30 tablet 3  . spironolactone (ALDACTONE) 25 MG tablet Take 50 mg by mouth daily. Take 2 tab by mouth at one time daily  0  . Vitamin D, Ergocalciferol, (DRISDOL) 50000 units CAPS capsule Take 1 capsule by mouth once a week. Take 1 cap by mouth once a week  0   No current facility-administered medications for this visit.    Allergies:   Codeine    Social History:  The patient  reports that she has never smoked. She has never used smokeless tobacco. She reports that she does not drink alcohol or use illicit drugs.   Family History:  The patient's family history includes Cancer in her maternal  grandmother and mother; Diabetes in her brother, father, maternal grandmother, and mother; Heart failure in her sister; Hypertension in her father and mother.    ROS:  Please see the history of present illness.   Otherwise, review of systems are positive for bilateral arm numbness, L leg pins and needles, weight gain, bowl irregularity.   All other systems are reviewed  and negative.   PHYSICAL EXAM: VS:  BP 164/84 mmHg  Pulse 80  Ht 5\' 4"  (1.626 m)  Wt 89.5 kg (197 lb 5 oz)  BMI 33.85 kg/m2 , BMI Body mass index is 33.85 kg/(m^2). GENERAL:  Well appearing HEENT:  Pupils equal round and reactive, fundi not visualized, oral mucosa unremarkable NECK:  No jugular venous distention, waveform within normal limits, carotid upstroke brisk and symmetric, no bruits, no thyromegaly LYMPHATICS:  No cervical adenopathy LUNGS:  Clear to auscultation bilaterally HEART:  RRR.  PMI not displaced or sustained,S1 and S2 within normal limits, no S3, no S4, no clicks, no rubs, no murmurs ABD:  Flat, positive bowel sounds normal in frequency in pitch, no bruits, no rebound, no guarding, no midline pulsatile mass, no hepatomegaly, no splenomegaly EXT:  2 plus pulses throughout, no edema, no cyanosis no clubbing SKIN:  No rashes no nodules NEURO:  Cranial nerves II through XII grossly intact, motor grossly intact throughout PSYCH:  Cognitively intact, oriented to person place and time   EKG:  EKG is ordered today. The ekg ordered today demonstrates sinus rhythm rate 81 bpm.  Inferolateral TW inversions.     Recent Labs: 01/18/2015: ALT 11 08/30/2015: BUN 11; Creatinine, Ser 1.23*; Potassium 3.9; Sodium 139    Lipid Panel    Component Value Date/Time   CHOL 159 05/25/2015 0911   TRIG 78.0 05/25/2015 0911   HDL 47.50 05/25/2015 0911   CHOLHDL 3 05/25/2015 0911   VLDL 15.6 05/25/2015 0911   LDLCALC 96 05/25/2015 0911   LDLDIRECT 152.8 10/09/2013 0938      Wt Readings from Last 3 Encounters:    11/07/15 89.5 kg (197 lb 5 oz)  10/13/15 91.899 kg (202 lb 9.6 oz)  09/01/15 89.177 kg (196 lb 9.6 oz)      ASSESSMENT AND PLAN:  # Chest pain: Ms. Lindsey's chest pain is atypical and seems unlikely to be due to ACS.  However, I am concerned that she has exertional dyspnea, which could be a sytmpom of ischemia.  Given this and her hypertension, obesity and diabetes, we will obtain an exercise Cardiolite to evaluate for ischemia.  # Hypertension: Ms. Mckinniss blood pressure is above goal.  We will add metoprolol instead of carvedilol given her history of asthma.  Continue amlodipine, losartan, HCTZ and start metoprolol tartrate 25 mg bid.  # Hyperlipidemia: Lipds are at goal.  Continue pravastatin.  # Obesity: [name] was encouraged to increase her exercise to at least 30-40 minutes most days of the week.   Current medicines are reviewed at length with the patient today.  The patient does not have concerns regarding medicines.  The following changes have been made:  Start metoprolol 25 mg bid.  Labs/ tests ordered today include:   Orders Placed This Encounter  Procedures  . Myocardial Perfusion Imaging  . EKG 12-Lead     Disposition:   FU with Amaura Authier C. Oval Linsey, MD, [facility] in 1 month.   This note was written with the assistance of speech recognition software.  Please excuse any transcriptional errors.  Signed, Victoria Henshaw C. Oval Linsey, MD, [facility]  11/07/2015 3:01 PM    [place]

## 2015-11-07 NOTE — Patient Instructions (Signed)
Medication Instructions:  START METOPROLOL TARTRATE 25 MG TWICE A DAY  Labwork: NONE  Testing/Procedures: Your physician has requested that you have en exercise stress myoview. For further information please visit [email or web address]. Please follow instruction sheet, as given.  Follow-Up: Your physician recommends that you schedule a follow-up appointment in: 1 MONTH   If you need a refill on your cardiac medications before your next appointment, please call your pharmacy.

## 2015-11-08 ENCOUNTER — Ambulatory Visit: Payer: Medicare Other | Admitting: Cardiovascular Disease

## 2015-11-08 ENCOUNTER — Other Ambulatory Visit (INDEPENDENT_AMBULATORY_CARE_PROVIDER_SITE_OTHER): Payer: Medicare Other

## 2015-11-08 DIAGNOSIS — Z794 Long term (current) use of insulin: Secondary | ICD-10-CM | POA: Diagnosis not present

## 2015-11-08 DIAGNOSIS — E1165 Type 2 diabetes mellitus with hyperglycemia: Secondary | ICD-10-CM | POA: Diagnosis not present

## 2015-11-08 LAB — BASIC METABOLIC PANEL
BUN: 15 mg/dL (ref 6–23)
CALCIUM: 9.3 mg/dL (ref 8.4–10.5)
CO2: 27 meq/L (ref 19–32)
CREATININE: 1.41 mg/dL — AB (ref 0.40–1.20)
Chloride: 102 mEq/L (ref 96–112)
GFR: 48.39 mL/min — AB (ref >60.00)
GLUCOSE: 281 mg/dL — AB (ref 70–99)
Potassium: 3.9 mEq/L (ref 3.5–5.1)
SODIUM: 139 meq/L (ref 135–145)

## 2015-11-08 LAB — HEMOGLOBIN A1C: HEMOGLOBIN A1C: 8.2 % — AB (ref 4.6–6.5)

## 2015-11-09 DIAGNOSIS — Z0000 Encounter for general adult medical examination without abnormal findings: Secondary | ICD-10-CM | POA: Diagnosis not present

## 2015-11-09 DIAGNOSIS — N3020 Other chronic cystitis without hematuria: Secondary | ICD-10-CM | POA: Diagnosis not present

## 2015-11-09 DIAGNOSIS — N3946 Mixed incontinence: Secondary | ICD-10-CM | POA: Diagnosis not present

## 2015-11-09 DIAGNOSIS — R350 Frequency of micturition: Secondary | ICD-10-CM | POA: Diagnosis not present

## 2015-11-11 ENCOUNTER — Encounter: Payer: Self-pay | Admitting: Endocrinology

## 2015-11-11 ENCOUNTER — Ambulatory Visit (INDEPENDENT_AMBULATORY_CARE_PROVIDER_SITE_OTHER): Payer: Medicare Other | Admitting: Endocrinology

## 2015-11-11 VITALS — BP 124/76 | HR 81 | Temp 97.7°F | Resp 14 | Ht 64.0 in | Wt 198.6 lb

## 2015-11-11 DIAGNOSIS — E1165 Type 2 diabetes mellitus with hyperglycemia: Secondary | ICD-10-CM | POA: Diagnosis not present

## 2015-11-11 DIAGNOSIS — Z794 Long term (current) use of insulin: Secondary | ICD-10-CM | POA: Diagnosis not present

## 2015-11-11 DIAGNOSIS — N182 Chronic kidney disease, stage 2 (mild): Secondary | ICD-10-CM | POA: Diagnosis not present

## 2015-11-11 MED ORDER — SPIRONOLACTONE 50 MG PO TABS: 50.0000 mg | ORAL_TABLET | Freq: Every day | ORAL | Status: DC

## 2015-11-11 MED ORDER — LOSARTAN POTASSIUM 50 MG PO TABS: 50.0000 mg | ORAL_TABLET | Freq: Every day | ORAL | Status: DC

## 2015-11-11 NOTE — Progress Notes (Signed)
Patient ID: [name], female   DOB: April 22, 1951, 65 y.o.   MRN: [id number]   Reason for Appointment: Diabetes follow-up   History of Present Illness   Diagnosis: Type 2, date of diagnosis:1985.    PAST history: She has had difficult to control diabetes requiring large doses of insulin especially at meal times. Because of poor control she was switched from NovoLog to U-500 insulin in 11/2012. At that time her A1c was 9%  Initially seemed to have better blood sugars. With no clear response with Invokana this was stopped and she was started on Actos. Her insulin was adjusted with higher mealtime coverage at breakfast and lunch and lower amounts at supper. Lantus requirement appeared to be more in the morning and evening dose was eventually stopped. She was on Victoza and Invokana later in an attempt to improve her sugars but with only limited success.  Because of continued poor control with subcutaneous insulin and large insulin requirement she was switched to a Medtronic insulin pump which she started on 07/29/13 Prior to starting insulin pump her blood sugar at home was averaging 273 With the U 500 insulin in the pump her blood sugars were dramatically better with rare hypoglycemia  She was switched to NovoLog insulin in her pump instead of U-500 insulin in 3/15 because of  episode of severe overnight hypoglycemia  RECENT history:   Pump settings  Basal rate midnight = 0.7, 3 AM = 0.7, 6 AM = 2.5, 11 AM = 4.0, 3 PM = 2.0 and 9 PM = 3.0, daily total 53 units Carbohydrate coverage 1:6, correction 1: 40 and target 110-120, insulin duration 4 hours Total daily insulin 67 units +/-5  Her most recent A1c is 8.2, higher than previously Because of her large insulin requirement and tendency to weight gain she was given a trial of Invokana in 2/17, was also started on metformin on her visit in 1/17 Although her blood sugars have been inconsistent they seem to be much better in the  last week or so Her weight has come down 4 pounds instead of her progressive weight gain previously  Current blood sugar patterns and problems:   FASTING blood sugars are generally better although not consistent, mostly better in the last week  Blood sugars are generally lower in the afternoons especially if she is exercising  Evening blood sugars are somewhat variable and may be higher around 8-9 PM  She is able to exercise 5 days a week more recently  With exercise she is using a temporary basal of 35-40% but her sugar may continue to be lower afterwards  She feels less tired  Still having some fear of hypoglycemia and not bolusing consistently in the evenings when her blood sugars are near normal which may result in high readings later in the evening; also may not always bolus for high readings in between meals  Recently checking sugar at least 4 times a day on average  HOME glucose readings for the last 2 weeks from pump download:  Mean values apply above for all meters except median for One Touch  PRE-MEAL Fasting Lunch Dinner Bedtime Overall  Glucose range: 118-218  75-228  86-205  54-273    Mean/median: 156    152  162+/-52     Meals: 2-3 meals per day, at 11 am, 3 pm and 7 pm Food preferences: She has oatmeal, toast frequently with egg for protein, usually has a sandwich and salad for lunch and vegetables  for dinner with fish or lean meat for protein. Will snack on apples, raisons or wheat thins.   Wt Readings from Last 3 Encounters:  11/11/15 198 lb 9.6 oz (90.084 kg)  11/07/15 197 lb 5 oz (89.5 kg)  10/13/15 202 lb 9.6 oz (91.899 kg)   Physical activity: exercise: at Gym for 90 min, 4-5/7 days at 10 am Mostly;  Dietician visit: Most recent: 2012    Lab Results  Component Value Date   HGBA1C 8.2* 11/08/2015   HGBA1C 7.8* 08/30/2015   HGBA1C 7.7* 05/25/2015   Lab Results  Component Value Date   MICROALBUR 7.2* 08/30/2015   LDLCALC 96 05/25/2015    CREATININE 1.41* 11/08/2015       Medication List       This list is accurate as of: 11/11/15  9:48 AM.  Always use your most recent med list.               ADVAIR DISKUS 250-50 MCG/DOSE Aepb  Generic drug:  Fluticasone-Salmeterol  Inhale 1 puff into the lungs 2 (two) times daily as needed (for shortness of breath).     amLODipine 10 MG tablet  Commonly known as:  NORVASC  Take 10 mg by mouth every evening.     BAYER CONTOUR NEXT TEST test strip  Generic drug:  glucose blood  use AS INSTRUCTED TO CHECK BLOOD SUGARS 7 TIMES A DAY     BAYER MICROLET LANCETS lancets  Use as instructed to check blood sugar 7 times per day dx code E11.65     canagliflozin 300 MG Tabs tablet  Commonly known as:  INVOKANA  Take 1 tablet (300 mg total) by mouth daily before breakfast.     docusate sodium 100 MG capsule  Commonly known as:  COLACE  Take 100 mg by mouth 2 (two) times daily as needed for mild constipation.     gabapentin 300 MG capsule  Commonly known as:  NEURONTIN  Take 1 capsule (300 mg total) by mouth 3 (three) times daily.     glucagon 1 MG injection  Commonly known as:  GLUCAGON EMERGENCY  Inject 1 mg into the vein once as needed (for low blood sugar).     HUMALOG 100 UNIT/ML injection  Generic drug:  insulin lispro  USE MAX 60 UNITS PER DAY WITH INSULIN PUMP     hydrOXYzine 25 MG tablet  Commonly known as:  ATARAX/VISTARIL     Liraglutide 18 MG/3ML Sopn  Commonly known as:  VICTOZA  Inject 1.2 mg daily     losartan 50 MG tablet  Commonly known as:  COZAAR  Take 1 tablet (50 mg total) by mouth daily.     metFORMIN 500 MG 24 hr tablet  Commonly known as:  GLUCOPHAGE-XR  Take 3 tablets (1,500 mg total) by mouth daily with supper.     metoprolol tartrate 25 MG tablet  Commonly known as:  LOPRESSOR  Take 1 tablet (25 mg total) by mouth 2 (two) times daily.     nitrofurantoin 100 MG capsule  Commonly known as:  MACRODANTIN  Take 1 capsule by mouth daily.  Take 1 cap by mouth daily     oxybutynin 10 MG 24 hr tablet  Commonly known as:  DITROPAN-XL  Take 1 tablet by mouth daily. Take 1 tab by mouth daily     pantoprazole 40 MG tablet  Commonly known as:  PROTONIX  Take 40 mg by mouth every evening.     PNEUMOVAX 23  25 MCG/0.5ML injection  Generic drug:  pneumococcal 23 valent vaccine     pravastatin 80 MG tablet  Commonly known as:  PRAVACHOL  Take 1 tablet (80 mg total) by mouth every morning.     spironolactone 50 MG tablet  Commonly known as:  ALDACTONE  Take 1 tablet (50 mg total) by mouth daily.     Vitamin D (Ergocalciferol) 50000 units Caps capsule  Commonly known as:  DRISDOL  Take 1 capsule by mouth once a week. Take 1 cap by mouth once a week        Allergies:  Allergies  Allergen Reactions  . Codeine Itching and Nausea And Vomiting    Past Medical History  Diagnosis Date  . Hypertension   . Fibromyalgia   . Hyperlipidemia   . SUI (stress urinary incontinence, female)   . Diabetes mellitus type 2, insulin dependent ([place])   . Insulin pump in place     since 12/ 2014--  [employer]  . GERD (gastroesophageal reflux disease)   . H/O hiatal hernia   . History of esophageal dilatation   . Renal insufficiency   . History of rhabdomyolysis     03/ 2015  . Asthma   . Wears glasses   . Full dentures   . Headache     h/o migraines - was followed for a time with a wellness doctor  . Arthritis     L knee, hands, back   . Osteoarthritis of left knee, primary localized 08/17/2014    Past Surgical History  Procedure Laterality Date  . Carpal tunnel release Right 2000  . Knee arthroscopy Right 1989  &  2002  . Excision left upper arm mass  01-29-2014  . Cardiac catheterization  07-20-2009   DR Chelsea  NORMAL  LVEF/  NORMAL CORONARY AND RENAL ARTERIES  . Colonoscopy with esophagogastroduodenoscopy (egd)  06-18-2002  . Esophagogastroduodenoscopy (egd) with esophageal dilation  06-12-2010  .  Cholecystectomy  1990  . Negative sleep study  2012    PER PT  . Bladder suspension N/A 03/09/2014    Procedure: CYSTOSCOPY/SLING;  Surgeon: Reece Packer, MD;  Location[facility];  Service: Urology;  Laterality: N/A;  . Eye surgery  2010    laser on R eye  . Partial knee arthroplasty Left 08/17/2014    Procedure: LEFT UNICOMPARTMENTAL KNEE;  Surgeon: Johnny Bridge, MD;  Location: [place];  Service: Orthopedics;  Laterality: Left;    Family History  Problem Relation Age of Onset  . Diabetes Mother   . Hypertension Mother   . Cancer Mother   . Diabetes Father   . Hypertension Father   . Diabetes Brother   . Heart failure Sister   . Diabetes Maternal Grandmother   . Cancer Maternal Grandmother     Social History:  reports that she has never smoked. She has never used smokeless tobacco. She reports that she does not drink alcohol or use illicit drugs.  Review of Systems    Creatinine was as high as  1.6 previously and improved, Slightly higher compared to the last visit     Lab Results  Component Value Date   CREATININE 1.41* 11/08/2015    HYPERTENSION:  This is better controlled, has also been followed by PCP. She is taking amlodipine, losartan HCTZ 1/2 Tablet and Aldactone 50 mg  BP at home [id number] systolic recently    Hypokalemia: none.  She is on Aldactone  and HCTZ    Lab Results  Component Value Date   CREATININE 1.41* 11/08/2015   BUN 15 11/08/2015   NA 139 11/08/2015   K 3.9 11/08/2015   CL 102 11/08/2015   CO2 27 11/08/2015     HYPERLIPIDEMIA:  LDL is at goal with using  pravastatin 80 mg    Lab Results  Component Value Date   CHOL 159 05/25/2015   HDL 47.50 05/25/2015   LDLCALC 96 05/25/2015   LDLDIRECT 152.8 10/09/2013   TRIG 78.0 05/25/2015   CHOLHDL 3 05/25/2015    NEUROPATHY: She is being treated with gabapentin for paresthesiae in feet, last foot exam in 10/16     Examination:   BP 124/76 mmHg  Pulse 81   Temp(Src) 97.7 F (36.5 C)  Resp 14  Ht 5\' 4"  (1.626 m)  Wt 198 lb 9.6 oz (90.084 kg)  BMI 34.07 kg/m2  SpO2 97%  Body mass index is 34.07 kg/(m^2).     Assesment/PLAN:   Diabetes type 2, uncontrolled  See history of present illness for detailed discussion of her current management, problems identified and blood sugar patterns Her blood sugars are overall Slightly better but most of her readings in the last week or so have been improved She is benefiting from starting Invokana 300 mg, with improving glucose control and some weight loss She has more energy and is able to exercise more often As discussed above she does need to bolus better for all the meals and high sugars Also needs to reduce her temporary basal for at least 2 hours after starting exercise instead of 60 minutes   Hypertension: This is now fairly well controlled with current regimen including reducing losartan with starting Invokana However her creatinine is slightly higher than before Will have her stop HCTZ and take 50 mg LOSARTAN only Her potassium is stable at 3.9 and will need to continue Aldactone unchanged   Counseling time on subjects discussed above is over 50% of today's 25 minute visit   Karesha Trzcinski 11/11/2015, 9:48 AM

## 2015-11-11 NOTE — Patient Instructions (Signed)
Temp basal of 30% for 2 hours with start of exercise  Bolus for all meals and hi sugars  New rx for Losartan

## 2015-11-16 ENCOUNTER — Ambulatory Visit
Admission: RE | Admit: 2015-11-16 | Discharge: 2015-11-16 | Disposition: A | Discharge status: Home / Self Care | Payer: Medicare Other | Source: Ambulatory Visit | Attending: Internal Medicine | Admitting: Internal Medicine

## 2015-11-16 DIAGNOSIS — R911 Solitary pulmonary nodule: Secondary | ICD-10-CM

## 2015-11-16 DIAGNOSIS — R918 Other nonspecific abnormal finding of lung field: Secondary | ICD-10-CM | POA: Diagnosis not present

## 2015-11-16 MED ORDER — IOPAMIDOL (ISOVUE-300) INJECTION 61%
75.0000 mL | Freq: Once | INTRAVENOUS | Status: AC | PRN
  Administered 2015-11-16: 75 mL via INTRAVENOUS

## 2015-11-18 ENCOUNTER — Telehealth (HOSPITAL_COMMUNITY): Payer: Self-pay

## 2015-11-18 NOTE — Telephone Encounter (Signed)
Encounter complete. 

## 2015-11-21 DIAGNOSIS — D519 Vitamin B12 deficiency anemia, unspecified: Secondary | ICD-10-CM | POA: Diagnosis not present

## 2015-11-23 ENCOUNTER — Ambulatory Visit (HOSPITAL_COMMUNITY)
Admission: RE | Admit: 2015-11-23 | Discharge: 2015-11-23 | Disposition: A | Discharge status: Home / Self Care | Payer: Medicare Other | Source: Ambulatory Visit | Attending: Urology | Admitting: Urology

## 2015-11-23 ENCOUNTER — Encounter (HOSPITAL_COMMUNITY): Payer: Self-pay | Admitting: *Deleted

## 2015-11-23 DIAGNOSIS — R079 Chest pain, unspecified: Secondary | ICD-10-CM

## 2015-11-23 DIAGNOSIS — R0602 Shortness of breath: Secondary | ICD-10-CM | POA: Insufficient documentation

## 2015-11-23 DIAGNOSIS — Z6834 Body mass index (BMI) 34.0-34.9, adult: Secondary | ICD-10-CM | POA: Insufficient documentation

## 2015-11-23 DIAGNOSIS — R5383 Other fatigue: Secondary | ICD-10-CM | POA: Diagnosis not present

## 2015-11-23 DIAGNOSIS — E669 Obesity, unspecified: Secondary | ICD-10-CM | POA: Diagnosis not present

## 2015-11-23 DIAGNOSIS — R42 Dizziness and giddiness: Secondary | ICD-10-CM | POA: Insufficient documentation

## 2015-11-23 DIAGNOSIS — Z8249 Family history of ischemic heart disease and other diseases of the circulatory system: Secondary | ICD-10-CM | POA: Diagnosis not present

## 2015-11-23 DIAGNOSIS — F329 Major depressive disorder, single episode, unspecified: Secondary | ICD-10-CM | POA: Diagnosis not present

## 2015-11-23 DIAGNOSIS — E119 Type 2 diabetes mellitus without complications: Secondary | ICD-10-CM | POA: Insufficient documentation

## 2015-11-23 DIAGNOSIS — I10 Essential (primary) hypertension: Secondary | ICD-10-CM | POA: Diagnosis not present

## 2015-11-23 LAB — MYOCARDIAL PERFUSION IMAGING
CHL CUP NUCLEAR SRS: 6
CHL CUP RESTING HR STRESS: 68 {beats}/min
CSEPED: 5 min
CSEPEDS: 1 s
CSEPPHR: 148 {beats}/min
Estimated workload: 6.3 METS
LV dias vol: 73 mL (ref 46–106)
LVSYSVOL: 24 mL
MPHR: 157 {beats}/min
NUC STRESS TID: 1.38
Percent HR: 94 %
RPE: 17
SDS: 2
SSS: 8

## 2015-11-23 MED ORDER — TECHNETIUM TC 99M SESTAMIBI GENERIC - CARDIOLITE
10.1000 | Freq: Once | INTRAVENOUS | Status: AC | PRN
  Administered 2015-11-23: 10.1 via INTRAVENOUS

## 2015-11-23 MED ORDER — TECHNETIUM TC 99M SESTAMIBI GENERIC - CARDIOLITE
29.8000 | Freq: Once | INTRAVENOUS | Status: AC | PRN
  Administered 2015-11-23: 29.8 via INTRAVENOUS

## 2015-11-23 NOTE — Progress Notes (Signed)
Dr Oval Linsey reviewed [place] study. Ok d/c pt home. Per Dr Oval Linsey, pt instructed to take metoprolol and an extra dose of losartan today when she gets home to help lower her BP. Pt has an appt w/ Dr Oval Linsey December 02, 2015 to discuss test results.Delight Hoh A

## 2015-12-01 ENCOUNTER — Ambulatory Visit (INDEPENDENT_AMBULATORY_CARE_PROVIDER_SITE_OTHER): Payer: Medicare Other | Admitting: Internal Medicine

## 2015-12-01 ENCOUNTER — Encounter: Payer: Self-pay | Admitting: Internal Medicine

## 2015-12-01 VITALS — BP 142/86 | HR 95 | Ht 64.0 in | Wt 196.0 lb

## 2015-12-01 DIAGNOSIS — J45991 Cough variant asthma: Secondary | ICD-10-CM

## 2015-12-01 LAB — NITRIC OXIDE: Nitric Oxide: 14

## 2015-12-01 MED ORDER — PANTOPRAZOLE SODIUM 40 MG PO TBEC: DELAYED_RELEASE_TABLET | ORAL | Status: DC

## 2015-12-01 MED ORDER — MOMETASONE FURO-FORMOTEROL FUM 100-5 MCG/ACT IN AERO: INHALATION_SPRAY | RESPIRATORY_TRACT | Status: DC

## 2015-12-01 NOTE — Assessment & Plan Note (Addendum)
NO 12/01/2015  =  14 so unlikely this is allergic asthma  12/01/2015 p  extensive coaching HFA effectiveness =    75%    dulera 100 2bid and d/c advair/ macrodantin  The most common causes of chronic cough in immunocompetent adults include the following: upper airway cough syndrome (UACS), previously referred to as postnasal drip syndrome (PNDS), which is caused by variety of rhinosinus conditions; (2) asthma; (3) GERD; (4) chronic bronchitis from cigarette smoking or other inhaled environmental irritants; (5) nonasthmatic eosinophilic bronchitis; and (6) bronchiectasis.   These conditions, singly or in combination, have accounted for up to 94% of the causes of chronic cough in prospective studies.   Other conditions have constituted no >6% of the causes in prospective studies These have included bronchogenic carcinoma, chronic interstitial pneumonia, sarcoidosis, left ventricular failure, ACEI-induced cough, and aspiration from a condition associated with pharyngeal dysfunction.    Chronic cough is often simultaneously caused by more than one condition. A single cause has been found from 38 to 82% of the time, multiple causes from 18 to 62%. Multiply caused cough has been the result of three diseases up to 42% of the time.      Based on hx/ exam this is either cough variant asthma or  Classic Upper airway cough syndrome, so named because it's frequently impossible to sort out how much is  CR/sinusitis with freq throat clearing (which can be related to primary GERD)   vs  causing  secondary (" extra esophageal")  GERD from wide swings in gastric pressure that occur with throat clearing, often  promoting self use of mint and menthol lozenges that reduce the lower esophageal sphincter tone and exacerbate the problem further in a cyclical fashion.   These are the same pts (now being labeled as having "irritable larynx syndrome" by some cough centers) who not infrequently have a history of having failed to  tolerate ace inhibitors,  dry powder inhalers or biphosphonates or report having atypical reflux symptoms that don't respond to standard doses of PPI , and are easily confused as having aecopd or asthma flares by even experienced allergists/ pulmonologists.   Of course it's possible she has elements of asthma and uacs since these are not mutually exclusive  rec trial of dulera 100 Take 2 puffs first thing in am and then another 2 puffs about 12 hours later and max rx for gerd then return for pfts   Total time devoted to counseling  = 35/53m review case with pt/ discussion of options/alternatives/ personally creating in presence of pt  then going over specific  Instructions directly with the pt including how to use all of the meds but in particular covering each new medication in detail (see avs)

## 2015-12-01 NOTE — Patient Instructions (Addendum)
Stop advair and macrodantin (until you see your urologist based on problems distinguishing macrodantin toxicity from your chronic resp complaints - once these are resolved then ok to rechallenge if needed to control utis)  dulera 100 Take 2 puffs first thing in am and then another 2 puffs about 12 hours later.    Work on inhaler technique:  relax and gently blow all the way out then take a nice smooth deep breath back in, triggering the inhaler at same time you start breathing in.  Hold for up to 5 seconds if you can. Blow out thru nose. Rinse and gargle with water when done     Pantoprazole 40 Take 30- 60 min before your first and last meals of the day   GERD (REFLUX)  is an extremely common cause of respiratory symptoms just like yours , many times with no obvious heartburn at all.    It can be treated with medication, but also with lifestyle changes including elevation of the head of your bed (ideally with 6 inch  bed blocks),  Smoking cessation, avoidance of late meals, excessive alcohol, and avoid fatty foods, chocolate, peppermint, colas, red wine, and acidic juices such as orange juice.  NO MINT OR MENTHOL PRODUCTS SO NO COUGH DROPS  USE SUGARLESS CANDY INSTEAD (Jolley ranchers or Stover's or Life Savers) or even ice chips will also do - the key is to swallow to prevent all throat clearing. NO OIL BASED VITAMINS - use powdered substitutes.  Please schedule a follow up office visit in 6 weeks, call sooner if needed with pfts on return

## 2015-12-01 NOTE — Progress Notes (Signed)
Subjective:    Patient ID: [name], female    DOB: 1951/04/18,   MRN: [id number]  HPI  14 yobf never smoker with allergies/asthma since the 5th grade spring/ summer > cool weather  eval by [facility] x years for asthma and did better on shots to point where no need for inhalers but stopped them around 2010 then back on advair prn so referred to pulmonary clinic 12/01/2015 by Dr Baird Cancer  12/01/2015 1st [facility] office visit/ [name]   Chief Complaint  Patient presents with  . Pulmonary Consult    Referred by Dr [name]. Pt c/o cough and SOB "for years". She is SOB with lying down flat or when she walks for approx 15 min.  Cough is mainly non prod, "feels like I am choking"- occ will produce minimal grey to yellow sputum.  Cough is so violent at times, she loses control of her bladder. Coughing is worse at night and spells can last 5 min or more.   not really taking advair regularly, tends to make her choke up and cough can be severe/ sporadic daytime and very minimally productive assoc with overt HB Doe = MMRC2 = can't walk a nl pace on a flat grade s sob but does fine slow and flat  No obvious other patterns in day to day or daytime variabilty or assoc  cp or chest tightness, subjective wheeze or overt sinus   symptoms. No unusual exp hx or h/o childhood pna/ knowledge of premature birth.  Also denies any obvious fluctuation of symptoms with weather or environmental changes or other aggravating or alleviating factors except as outlined above   Current Medications, Allergies, Complete Past Medical History, Past Surgical History, Family History, and Social History were reviewed in [employer] record.            Review of Systems  Constitutional: Negative for fever, chills and unexpected weight change.  HENT: Positive for sneezing, trouble swallowing and voice change. Negative for congestion, dental problem, ear pain, nosebleeds, postnasal  drip, rhinorrhea, sinus pressure and sore throat.   Eyes: Negative for visual disturbance.  Respiratory: Positive for cough and shortness of breath. Negative for choking.   Cardiovascular: Positive for chest pain. Negative for leg swelling.  Gastrointestinal: Negative for vomiting, abdominal pain and diarrhea.  Genitourinary: Negative for difficulty urinating.       Acid heartburn  Musculoskeletal: Positive for arthralgias.  Skin: Negative for rash.  Neurological: Negative for tremors, syncope and headaches.  Hematological: Does not bruise/bleed easily.       Objective:   Physical Exam  amb bf extremely hoarse with very prominent pseudowheeze eliminated with plm   Wt Readings from Last 3 Encounters:  12/01/15 196 lb (88.905 kg)  11/23/15 198 lb (89.812 kg)  11/11/15 198 lb 9.6 oz (90.084 kg)    Vital signs reviewed   HEENT: nl  turbinates, and oropharynx. Nl external ear canals without cough reflex-  Full dentures   NECK :  without JVD/Nodes/TM/ nl carotid upstrokes bilaterally   LUNGS: no acc muscle use,  Nl contour chest which is clear to A and P bilaterally without cough on insp or exp maneuvers   CV:  RRR  no s3 or murmur or increase in P2, no edema   ABD:  soft and nontender with nl inspiratory excursion in the supine position. No bruits or organomegaly, bowel sounds nl  MS:  Nl gait/ ext warm without deformities, calf tenderness, cyanosis or  clubbing No obvious joint restrictions   SKIN: warm and dry without lesions    NEURO:  alert, approp, nl sensorium with  no motor deficits      I personally reviewed images and agree with radiology impression as follows:  CT Chest   11/16/15 Persistent scarring with nodularity in the superior segment right lower lobe, slightly less prominent than on the 2013 study. There is a localized dilated bronchus in this area, probably due to chronic mucous plugging. Evidence of mild central chronic bronchitis. Lungs elsewhere  are clear. No appreciable adenopathy.         Assessment & Plan:

## 2015-12-01 NOTE — Progress Notes (Signed)
   Subjective:    Patient ID: [name], female    DOB: 09/11/1950, 65 y.o.   MRN: [id number]  HPI    Review of Systems     Objective:   Physical Exam        Assessment & Plan:

## 2015-12-02 ENCOUNTER — Encounter: Payer: Self-pay | Admitting: Cardiovascular Disease

## 2015-12-02 ENCOUNTER — Ambulatory Visit (INDEPENDENT_AMBULATORY_CARE_PROVIDER_SITE_OTHER): Payer: Medicare Other | Admitting: Cardiovascular Disease

## 2015-12-02 VITALS — BP 130/80 | HR 72 | Ht 64.0 in | Wt 194.0 lb

## 2015-12-02 DIAGNOSIS — E785 Hyperlipidemia, unspecified: Secondary | ICD-10-CM | POA: Diagnosis not present

## 2015-12-02 DIAGNOSIS — I10 Essential (primary) hypertension: Secondary | ICD-10-CM | POA: Diagnosis not present

## 2015-12-02 DIAGNOSIS — R0782 Intercostal pain: Secondary | ICD-10-CM | POA: Diagnosis not present

## 2015-12-02 NOTE — Progress Notes (Signed)
Cardiology Office Note   Date:  12/02/2015   ID:  [name], [name] October 06, 1950, MRN [id number]  PCP:  Maximino Greenland, MD  Cardiologist:   Sharol Harness, MD   Chief Complaint  Patient presents with  . Follow-up    some shortness of breath with excersion, lightheadedness upon standing      Patient ID: [name] is a 65 y.o. female with hypertension, hyperlipidemia, asthma, diabetes mellitus type 2 who presents for an evaluation of chest pain and shortness of breath.    Interval history 12/02/15: After her last appointment [name] had an exercise Cardiolite that was negative for ischemia and showed normal systolic function.  Her last appointment we added metoprolol to her regimen.  She has been doing better. She no longer has left-sided chest pain. She still sometimes has pain under her right breast with exertion. It lasts for approximately 5 minutes and gets better with rest. She continues to exercise approximately 4 times per week. She denies any shortness of breath, lightheadedness, dizziness, palpitations, lower extremity edema, orthopnea or PND.  History of Present Illness 11/07/15: She has noted 4-5 years of intermittent chest pain.  It happens approximately two times per month and is worse when she goes from laying down to sitiing up or when walking.  She gets short of breath when it occurs.  The pain is typically a sharp, stabbing pain lasting 1-2 minutes.  There is associated nausea and diaphoresis. She notes some orthopnea but denies PND.  [name] has also noted swelling in bilateral legs over the last month.  She also notes exertional dyspena.  After trying to clean her home she gets extremely fatiged and has to stop.  She used to go to the [facility] and exercise 5 times per week but now gets tired after a few minutes of exercise.  [name] also noted fatigue.  She feels tired upon awakening in the morning.  She has a long-standing history insomnia and  states that "if I get 3 hours of sleep its a good night."  She had a sleep study that was negative.  She notes that her BP has been running high at home.  It is typically in the 160s/90s.  She takes her meds as prescribed.   She has been trying to eat healthy.  She avoids fried foods and gets a low fiber.    Past Medical History  Diagnosis Date  . Hypertension   . Fibromyalgia   . Hyperlipidemia   . SUI (stress urinary incontinence, female)   . Diabetes mellitus type 2, insulin dependent ([place])   . Insulin pump in place     since 12/ 2014--  [employer]  . GERD (gastroesophageal reflux disease)   . H/O hiatal hernia   . History of esophageal dilatation   . Renal insufficiency   . History of rhabdomyolysis     03/ 2015  . Asthma   . Wears glasses   . Full dentures   . Headache     h/o migraines - was followed for a time with a wellness doctor  . Arthritis     L knee, hands, back   . Osteoarthritis of left knee, primary localized 08/17/2014    Past Surgical History  Procedure Laterality Date  . Carpal tunnel release Right 2000  . Knee arthroscopy Right 1989  &  2002  . Excision left upper arm mass  01-29-2014  . Cardiac catheterization  07-20-2009   DR  THOMAS KELLY    MODERATE LVH/  NORMAL  LVEF/  NORMAL CORONARY AND RENAL ARTERIES  . Colonoscopy with esophagogastroduodenoscopy (egd)  06-18-2002  . Esophagogastroduodenoscopy (egd) with esophageal dilation  06-12-2010  . Cholecystectomy  1990  . Negative sleep study  2012    PER PT  . Bladder suspension N/A 03/09/2014    Procedure: CYSTOSCOPY/SLING;  Surgeon: Reece Packer, MD;  Location: [facility];  Service: Urology;  Laterality: N/A;  . Eye surgery  2010    laser on R eye  . Partial knee arthroplasty Left 08/17/2014    Procedure: LEFT UNICOMPARTMENTAL KNEE;  Surgeon: Johnny Bridge, MD;  Location: [facility];  Service: Orthopedics;  Laterality: Left;    Current Outpatient Prescriptions  Medication Sig  Dispense Refill  . amLODipine (NORVASC) 10 MG tablet Take 10 mg by mouth every evening.     [name] BAYER CONTOUR NEXT TEST test strip use AS INSTRUCTED TO CHECK BLOOD SUGARS 7 TIMES A DAY 300 each 5  . BAYER MICROLET LANCETS lancets Use as instructed to check blood sugar 7 times per day dx code E11.65 300 each 5  . canagliflozin (INVOKANA) 300 MG TABS tablet Take 1 tablet (300 mg total) by mouth daily before breakfast. 30 tablet 3  . docusate sodium (COLACE) 100 MG capsule Take 100 mg by mouth 2 (two) times daily as needed for mild constipation.    . gabapentin (NEURONTIN) 300 MG capsule Take 1 capsule (300 mg total) by mouth 3 (three) times daily. 90 capsule 3  . glucagon (GLUCAGON EMERGENCY) 1 MG injection Inject 1 mg into the vein once as needed (for low blood sugar). 1 each 12  . HUMALOG 100 UNIT/ML injection USE MAX 60 UNITS PER DAY WITH INSULIN PUMP 20 mL 5  . hydrOXYzine (ATARAX/VISTARIL) 25 MG tablet Take 25 mg by mouth every 4 (four) hours as needed.   0  . Liraglutide (VICTOZA) 18 MG/3ML SOPN Inject 1.2 mg daily 18 mL 1  . losartan (COZAAR) 50 MG tablet Take 1 tablet (50 mg total) by mouth daily. 90 tablet 3  . metFORMIN (GLUCOPHAGE-XR) 500 MG 24 hr tablet Take 3 tablets (1,500 mg total) by mouth daily with supper. 90 tablet 3  . metoprolol tartrate (LOPRESSOR) 25 MG tablet Take 1 tablet (25 mg total) by mouth 2 (two) times daily. 60 tablet 3  . mometasone-formoterol (DULERA) 100-5 MCG/ACT AERO Take 2 puffs first thing in am and then another 2 puffs about 12 hours later. 1 Inhaler 0  . oxybutynin (DITROPAN-XL) 10 MG 24 hr tablet Take 1 tablet by mouth daily. Take 1 tab by mouth daily  1  . pantoprazole (PROTONIX) 40 MG tablet Take 30- 60 min before your first and last meals of the day 60 tablet 11  . pravastatin (PRAVACHOL) 80 MG tablet Take 1 tablet (80 mg total) by mouth every morning. 30 tablet 3  . spironolactone (ALDACTONE) 50 MG tablet Take 1 tablet (50 mg total) by mouth daily. 30  tablet 5  . Vitamin D, Ergocalciferol, (DRISDOL) 50000 units CAPS capsule Take 1 capsule by mouth once a week. Take 1 cap by mouth once a week  0   No current facility-administered medications for this visit.    Allergies:   Codeine    Social History:  The patient  reports that she has never smoked. She has never used smokeless tobacco. She reports that she does not drink alcohol or use illicit drugs.   Family History:  The patient's family history includes Cancer in her maternal grandmother; Diabetes in her brother, father, maternal grandmother, and mother; Heart failure in her sister; Hypertension in her father and mother; Lung cancer in her father and mother.    ROS:  Please see the history of present illness.   Otherwise, review of systems are positive for bilateral arm numbness, L leg pins and needles, weight gain, bowl irregularity.   All other systems are reviewed and negative.   PHYSICAL EXAM: VS:  BP 130/80 mmHg  Pulse 72  Ht 5\' 4"  (1.626 m)  Wt 87.998 kg (194 lb)  BMI 33.28 kg/m2 , BMI Body mass index is 33.28 kg/(m^2). GENERAL:  Well appearing HEENT:  Pupils equal round and reactive, fundi not visualized, oral mucosa unremarkable NECK:  No jugular venous distention, waveform within normal limits, carotid upstroke brisk and symmetric, no bruits LYMPHATICS:  No cervical adenopathy LUNGS:  Clear to auscultation bilaterally HEART:  RRR.  PMI not displaced or sustained,S1 and S2 within normal limits, no S3, no S4, no clicks, no rubs, no murmurs ABD:  Flat, positive bowel sounds normal in frequency in pitch, no bruits, no rebound, no guarding, no midline pulsatile mass, no hepatomegaly, no splenomegaly EXT:  2 plus pulses throughout, no edema, no cyanosis no clubbing SKIN:  No rashes no nodules NEURO:  Cranial nerves II through XII grossly intact, motor grossly intact throughout PSYCH:  Cognitively intact, oriented to person place and time   EKG:  EKG is not ordered  today. The ekg ordered today demonstrates sinus rhythm rate 81 bpm.  Inferolateral TW inversions.    Exercise Cardiolite 11/23/15:  The left ventricular ejection fraction is hyperdynamic (>65%).  Nuclear stress EF: 67%.  Blood pressure demonstrated a hypertensive response to exercise.  There was no ST segment deviation noted during stress.  The study is normal.  This is a low risk study.  Recent Labs: 01/18/2015: ALT 11 11/08/2015: BUN 15; Creatinine, Ser 1.41*; Potassium 3.9; Sodium 139    Lipid Panel    Component Value Date/Time   CHOL 159 05/25/2015 0911   TRIG 78.0 05/25/2015 0911   HDL 47.50 05/25/2015 0911   CHOLHDL 3 05/25/2015 0911   VLDL 15.6 05/25/2015 0911   LDLCALC 96 05/25/2015 0911   LDLDIRECT 152.8 10/09/2013 0938      Wt Readings from Last 3 Encounters:  12/02/15 87.998 kg (194 lb)  12/01/15 88.905 kg (196 lb)  11/23/15 89.812 kg (198 lb)      ASSESSMENT AND PLAN:  # Chest pain:  Resolved and stress test was normal.  # Hypertension: Blood pressure is better-controlled. Continue amlodipine, losartan, HCTZ and metoprolol tartrate.  # Hyperlipidemia: Lipds are at goal.  Continue pravastatin.  # Obesity: [name] was congratulated on her exercise and diet efforts.   Current medicines are reviewed at length with the patient today.  The patient does not have concerns regarding medicines.  The following changes have been made:  None   Labs/ tests ordered today include:   No orders of the defined types were placed in this encounter.     Disposition:   FU with Holiday Mcmenamin C. Oval Linsey, MD, [facility] in 6 months.   This note was written with the assistance of speech recognition software.  Please excuse any transcriptional errors.  Signed, Kipton Skillen C. Oval Linsey, MD, [facility]  12/02/2015 10:13 AM    [place]

## 2015-12-02 NOTE — Patient Instructions (Signed)

## 2015-12-07 DIAGNOSIS — R350 Frequency of micturition: Secondary | ICD-10-CM | POA: Diagnosis not present

## 2015-12-07 DIAGNOSIS — Z0000 Encounter for general adult medical examination without abnormal findings: Secondary | ICD-10-CM | POA: Diagnosis not present

## 2015-12-07 DIAGNOSIS — N3020 Other chronic cystitis without hematuria: Secondary | ICD-10-CM | POA: Diagnosis not present

## 2015-12-12 DIAGNOSIS — E1165 Type 2 diabetes mellitus with hyperglycemia: Secondary | ICD-10-CM | POA: Diagnosis not present

## 2016-01-09 ENCOUNTER — Other Ambulatory Visit: Payer: Medicare Other

## 2016-01-12 ENCOUNTER — Ambulatory Visit: Payer: Medicare Other | Admitting: Endocrinology

## 2016-01-19 ENCOUNTER — Other Ambulatory Visit: Payer: Self-pay | Admitting: Internal Medicine

## 2016-01-19 DIAGNOSIS — R0600 Dyspnea, unspecified: Secondary | ICD-10-CM

## 2016-01-20 ENCOUNTER — Ambulatory Visit (INDEPENDENT_AMBULATORY_CARE_PROVIDER_SITE_OTHER): Payer: Medicare Other | Admitting: Internal Medicine

## 2016-01-20 ENCOUNTER — Encounter: Payer: Self-pay | Admitting: Internal Medicine

## 2016-01-20 VITALS — BP 132/80 | HR 86 | Ht 64.0 in | Wt 188.0 lb

## 2016-01-20 DIAGNOSIS — J45991 Cough variant asthma: Secondary | ICD-10-CM | POA: Diagnosis not present

## 2016-01-20 DIAGNOSIS — R0600 Dyspnea, unspecified: Secondary | ICD-10-CM

## 2016-01-20 LAB — PULMONARY FUNCTION TEST
DL/VA % pred: 120 %
DL/VA: 5.46 ml/min/mmHg/L
DLCO UNC: 21.55 ml/min/mmHg
DLCO cor % pred: 97 %
DLCO cor: 21.05 ml/min/mmHg
DLCO unc % pred: 99 %
FEF 25-75 POST: 0.99 L/s
FEF 25-75 Pre: 0.62 L/sec
FEF2575-%Change-Post: 59 %
FEF2575-%Pred-Post: 54 %
FEF2575-%Pred-Pre: 34 %
FEV1-%CHANGE-POST: 14 %
FEV1-%PRED-POST: 74 %
FEV1-%PRED-PRE: 64 %
FEV1-POST: 1.36 L
FEV1-PRE: 1.18 L
FEV1FVC-%Change-Post: 0 %
FEV1FVC-%PRED-PRE: 80 %
FEV6-%Change-Post: 16 %
FEV6-%PRED-POST: 94 %
FEV6-%PRED-PRE: 81 %
FEV6-POST: 2.12 L
FEV6-Pre: 1.83 L
FEV6FVC-%CHANGE-POST: 0 %
FEV6FVC-%PRED-POST: 102 %
FEV6FVC-%PRED-PRE: 102 %
FVC-%CHANGE-POST: 15 %
FVC-%PRED-POST: 91 %
FVC-%PRED-PRE: 79 %
FVC-POST: 2.15 L
FVC-Pre: 1.86 L
POST FEV6/FVC RATIO: 99 %
PRE FEV6/FVC RATIO: 98 %
Post FEV1/FVC ratio: 63 %
Pre FEV1/FVC ratio: 63 %
RV % PRED: 141 %
RV: 2.78 L
TLC % PRED: 102 %
TLC: 4.88 L

## 2016-01-20 MED ORDER — MOMETASONE FURO-FORMOTEROL FUM 100-5 MCG/ACT IN AERO: INHALATION_SPRAY | RESPIRATORY_TRACT | Status: DC

## 2016-01-20 MED ORDER — ALBUTEROL SULFATE HFA 108 (90 BASE) MCG/ACT IN AERS: INHALATION_SPRAY | RESPIRATORY_TRACT | Status: DC

## 2016-01-20 NOTE — Progress Notes (Signed)
Subjective:    Patient ID: [name], female    DOB: Aug 27, 1951,   MRN: [id number]    Brief patient profile:  87 yobf never smoker with allergies/asthma since the 5th grade spring/ summer > cool weather  eval by [facility] x years for asthma and did better on shots to point where no need for inhalers but stopped them around 2010 then back on advair prn so referred to pulmonary clinic 12/01/2015 by Dr Baird Cancer    History of Present Illness  12/01/2015 1st [facility] office visit/ [name]   Chief Complaint  Patient presents with  . Pulmonary Consult    Referred by Dr [name]. Pt c/o cough and SOB "for years". She is SOB with lying down flat or when she walks for approx 15 min.  Cough is mainly non prod, "feels like I am choking"- occ will produce minimal grey to yellow sputum.  Cough is so violent at times, she loses control of her bladder. Coughing is worse at night and spells can last 5 min or more.   not really taking advair regularly, tends to make her choke up and cough can be severe/ sporadic daytime and very minimally productive assoc with overt HB Doe = MMRC2 = can't walk a nl pace on a flat grade s sob but does fine slow and flat rec Stop advair and macrodantin (until you see your urologist based on problems distinguishing macrodantin toxicity from your chronic resp complaints - once these are resolved then ok to rechallenge if needed to control utis) Dulera 100 Take 2 puffs first thing in am and then another 2 puffs about 12 hours later.  Pantoprazole 40 Take 30- 60 min before your first and last meals of the day  GERD     01/20/2016  f/u ov/Irvin Lizama re: moderate chronic asthma/ dtc on "dulera 100 2bid" Chief Complaint  Patient presents with  . Follow-up    Cough has only improved slightly. She is having less coughing spells, but they are just as severe.   doe = MMRC2 = can't walk a nl pace on a flat grade s sob but does fine slow and flat eg shopping fine, ok  without cart    Cough some better off advair and on dulera 100 but note has full sample with her and was only given 2  @ ov 12/01/15   No obvious day to day or daytime variability or assoc excess/ purulent sputum or mucus plugs or hemoptysis or cp or chest tightness, subjective wheeze or overt sinus or hb symptoms. No unusual exp hx or h/o childhood pna/ asthma or knowledge of premature birth.  Sleeping ok without nocturnal  or early am exacerbation  of respiratory  c/o's or need for noct saba. Also denies any obvious fluctuation of symptoms with weather or environmental changes or other aggravating or alleviating factors except as outlined above   Current Medications, Allergies, Complete Past Medical History, Past Surgical History, Family History, and Social History were reviewed in [employer] record.  ROS  The following are not active complaints unless bolded sore throat, dysphagia, dental problems, itching, sneezing,  nasal congestion or excess/ purulent secretions, ear ache,   fever, chills, sweats, unintended wt loss, classically pleuritic or exertional cp,  orthopnea pnd or leg swelling, presyncope, palpitations, abdominal pain, anorexia, nausea, vomiting, diarrhea  or change in bowel or bladder habits, change in stools or urine, dysuria,hematuria,  rash, arthralgias, visual complaints, headache, numbness, weakness or ataxia  or problems with walking or coordination,  change in mood/affect or memory.                Objective:   Physical Exam  amb bf not as hoarse  01/20/2016        188   12/01/15 196 lb (88.905 kg)  11/23/15 198 lb (89.812 kg)  11/11/15 198 lb 9.6 oz (90.084 kg)    Vital signs reviewed   HEENT: nl  turbinates, and oropharynx. Nl external ear canals without cough reflex-  Full dentures   NECK :  without JVD/Nodes/TM/ nl carotid upstrokes bilaterally   LUNGS: no acc muscle use,  Nl contour chest which is clear to A and P bilaterally  without cough on insp or exp maneuvers   CV:  RRR  no s3 or murmur or increase in P2, no edema   ABD:  soft and nontender with nl inspiratory excursion in the supine position. No bruits or organomegaly, bowel sounds nl  MS:  Nl gait/ ext warm without deformities, calf tenderness, cyanosis or clubbing No obvious joint restrictions   SKIN: warm and dry without lesions    NEURO:  alert, approp, nl sensorium with  no motor deficits      I personally reviewed images and agree with radiology impression as follows:  CT Chest   11/16/15 Persistent scarring with nodularity in the superior segment right lower lobe, slightly less prominent than on the 2013 study. There is a localized dilated bronchus in this area, probably due to chronic mucous plugging. Evidence of mild central chronic bronchitis. Lungs elsewhere are clear. No appreciable adenopathy.         Assessment & Plan:

## 2016-01-20 NOTE — Assessment & Plan Note (Addendum)
NO 12/01/2015  =  14  12/01/2015     dulera 100 2bid and d/c advair/ macrodantin - 01/20/2016  After extensive coaching HFA effectiveness =    75% > continue dulera 100  - PFT's  01/20/2016  FEV1 1.36 ( % ) ratio 63  p 14 % improvement from saba p ? dulera 100 x 2 x 8 6-8 h  prior to study with DLCO  99/97 % corrects to 120 % for alv volume      DDX of  difficult airways management almost all start with A and  include Adherence, Ace Inhibitors, Acid Reflux, Active Sinus Disease, Alpha 1 Antitripsin deficiency, Anxiety masquerading as Airways dz,  ABPA,  Allergy(esp in young), Aspiration (esp in elderly), Adverse effects of meds,  Active smokers, A bunch of PE's (a small clot burden can't cause this syndrome unless there is already severe underlying pulm or vascular dz with poor reserve) plus two Bs  = Bronchiectasis and Beta blocker use..and one C= CHF  Adherence is always the initial "prime suspect" and is a multilayered concern that requires a "trust but verify" approach in every patient - starting with knowing how to use medications, especially inhalers, correctly, keeping up with refills and understanding the fundamental difference between maintenance and prns vs those medications only taken for a very short course and then stopped and not refilled.  - counts are way off on samples of dulera  - The proper method of use, as well as anticipated side effects, of a metered-dose inhaler are discussed and demonstrated to the patient. Improved effectiveness after extensive coaching during this visit to a level of approximately 75 % from a baseline of 25 % > continue dulera 100 bid  ? Acid (or non-acid) GERD > always difficult to exclude as up to 75% of pts in some series report no assoc GI/ Heartburn symptoms> rec continue max (24h)  acid suppression and diet restrictions/ reviewed     ? Allergy > FENO so low rules against  ? Adverse effects of dpi > leave off and hope she can master hfa  I had an extended  discussion with the patient reviewing all relevant studies completed to date and  lasting 15 to 20 minutes of a 25 minute visit    Each maintenance medication was reviewed in detail including most importantly the difference between maintenance and prns and under what circumstances the prns are to be triggered using an action plan format that is not reflected in the computer generated alphabetically organized AVS.    Please see instructions for details which were reviewed in writing and the patient given a copy highlighting the part that I personally wrote and discussed at today's ov.

## 2016-01-20 NOTE — Patient Instructions (Signed)
Plan A = Automatic = Dulera 100 Take 2 puffs first thing in am and then another 2 puffs about 12 hours later.   Work on inhaler technique:  relax and gently blow all the way out then take a nice smooth deep breath back in, triggering the inhaler at same time you start breathing in.  Hold for up to 5 seconds if you can. Blow out thru nose. Rinse and gargle with water when done      Plan B = Backup Only use your albuterol(proair)  as a rescue medication to be used if you can't catch your breath by resting or doing a relaxed purse lip breathing pattern.  - The less you use it, the better it will work when you need it. - Ok to use the inhaler up to 2 puffs  every 4 hours if you must but call for appointment if use goes up over your usual need - Don't leave home without it !!  (think of it like the spare tire for your car)   Please schedule a follow up visit in 3 months but call sooner if needed - call if any problems filling the dulera

## 2016-01-20 NOTE — Progress Notes (Signed)
PFT done today. 

## 2016-02-13 ENCOUNTER — Other Ambulatory Visit: Payer: Self-pay | Admitting: Internal Medicine

## 2016-02-13 DIAGNOSIS — Z1231 Encounter for screening mammogram for malignant neoplasm of breast: Secondary | ICD-10-CM

## 2016-02-29 ENCOUNTER — Ambulatory Visit
Admission: RE | Admit: 2016-02-29 | Discharge: 2016-02-29 | Disposition: A | Discharge status: Home / Self Care | Payer: Medicare Other | Source: Ambulatory Visit | Attending: Internal Medicine | Admitting: Internal Medicine

## 2016-02-29 DIAGNOSIS — Z1231 Encounter for screening mammogram for malignant neoplasm of breast: Secondary | ICD-10-CM | POA: Diagnosis not present

## 2016-03-13 DIAGNOSIS — E1165 Type 2 diabetes mellitus with hyperglycemia: Secondary | ICD-10-CM | POA: Diagnosis not present

## 2016-04-23 ENCOUNTER — Ambulatory Visit: Payer: Medicare Other | Admitting: Internal Medicine

## 2016-05-11 ENCOUNTER — Ambulatory Visit: Payer: Medicare Other | Admitting: Internal Medicine

## 2016-05-15 ENCOUNTER — Other Ambulatory Visit: Payer: Self-pay | Admitting: Endocrinology

## 2016-05-15 ENCOUNTER — Other Ambulatory Visit: Payer: Self-pay | Admitting: Cardiovascular Disease

## 2016-05-15 NOTE — Telephone Encounter (Signed)
Review for refill. 

## 2016-05-26 ENCOUNTER — Other Ambulatory Visit: Payer: Self-pay | Admitting: Endocrinology

## 2016-06-14 ENCOUNTER — Other Ambulatory Visit (INDEPENDENT_AMBULATORY_CARE_PROVIDER_SITE_OTHER): Payer: Medicare Other

## 2016-06-14 DIAGNOSIS — Z794 Long term (current) use of insulin: Secondary | ICD-10-CM | POA: Diagnosis not present

## 2016-06-14 DIAGNOSIS — E1165 Type 2 diabetes mellitus with hyperglycemia: Secondary | ICD-10-CM

## 2016-06-14 LAB — COMPREHENSIVE METABOLIC PANEL
ALBUMIN: 3.7 g/dL (ref 3.5–5.2)
ALK PHOS: 96 U/L (ref 39–117)
ALT: 10 U/L (ref 0–35)
AST: 14 U/L (ref 0–37)
BILIRUBIN TOTAL: 0.7 mg/dL (ref 0.2–1.2)
BUN: 16 mg/dL (ref 6–23)
CHLORIDE: 100 meq/L (ref 96–112)
CO2: 28 mEq/L (ref 19–32)
CREATININE: 1.18 mg/dL (ref 0.40–1.20)
Calcium: 9.1 mg/dL (ref 8.4–10.5)
GFR: 59.32 mL/min — ABNORMAL LOW (ref >60.00)
GLUCOSE: 290 mg/dL — AB (ref 70–99)
POTASSIUM: 3.5 meq/L (ref 3.5–5.1)
SODIUM: 137 meq/L (ref 135–145)
TOTAL PROTEIN: 6.9 g/dL (ref 6.0–8.3)

## 2016-06-14 LAB — HEMOGLOBIN A1C: HEMOGLOBIN A1C: 9.1 % — AB (ref 4.6–6.5)

## 2016-06-19 ENCOUNTER — Ambulatory Visit (INDEPENDENT_AMBULATORY_CARE_PROVIDER_SITE_OTHER): Payer: Medicare Other | Admitting: Endocrinology

## 2016-06-19 ENCOUNTER — Encounter: Payer: Self-pay | Admitting: Endocrinology

## 2016-06-19 VITALS — BP 122/84 | HR 84 | Temp 98.1°F | Resp 14 | Ht 64.0 in | Wt 183.4 lb

## 2016-06-19 DIAGNOSIS — E876 Hypokalemia: Secondary | ICD-10-CM | POA: Diagnosis not present

## 2016-06-19 DIAGNOSIS — Z794 Long term (current) use of insulin: Secondary | ICD-10-CM

## 2016-06-19 DIAGNOSIS — E1165 Type 2 diabetes mellitus with hyperglycemia: Secondary | ICD-10-CM

## 2016-06-19 DIAGNOSIS — I10 Essential (primary) hypertension: Secondary | ICD-10-CM

## 2016-06-19 NOTE — Progress Notes (Signed)
Patient ID: [name], female   DOB: 17-Jun-1951, 65 y.o.   MRN: [id number]   Reason for Appointment: Diabetes follow-up   History of Present Illness   Diagnosis: Type 2, date of diagnosis:1985.    PAST history: She has had difficult to control diabetes requiring large doses of insulin especially at meal times. Because of poor control she was switched from NovoLog to U-500 insulin in 11/2012. At that time her A1c was 9%  Initially seemed to have better blood sugars. With no clear response with Invokana this was stopped and she was started on Actos. Her insulin was adjusted with higher mealtime coverage at breakfast and lunch and lower amounts at supper. Lantus requirement appeared to be more in the morning and evening dose was eventually stopped. She was on Victoza and Invokana later in an attempt to improve her sugars but with only limited success.  Because of continued poor control with subcutaneous insulin and large insulin requirement she was switched to a [employer] insulin pump which she started on 07/29/13 Prior to starting insulin pump her blood sugar at home was averaging 273 With the U 500 insulin in the pump her blood sugars were dramatically better with rare hypoglycemia  She was switched to NovoLog insulin in her pump instead of U-500 insulin in 3/15 because of  episode of severe overnight hypoglycemia  RECENT history:   Pump settings  Basal rate midnight = 0.7, 3 AM = 0.7, 6 AM = 2.5, 11 AM = 4.0, 3 PM = 2.0 and 9 PM = 3.0, daily total 53 units Carbohydrate coverage 1:6, correction 1: 40 and target 110-120, insulin duration 4 hours Total daily insulin 62 units +/-5  Non-insulin hypoglycemic drugs: Victoza 1.2 mg daily, metformin ER 1500 mg daily, Invokana 300 mg daily  She has not been seen in follow-up for several months as she has been out of town  Her most recent A1c is 9.1, previously 8.2  Current blood sugar patterns and problems:   FASTING blood  sugars are not being checked much, she has one good reading but the other 2 are high  She is checking her blood sugars mostly in the evenings around 7-9 PM and these are high except once  POSTPRANDIAL readings after her evening meal are variable and usually not much higher generally better although not consistent, mostly better in the last week  Even though she has been told to check her blood sugars consistently she is checking them about once a day.  She has been told to take boluses consistently recall meals and snacks but she is not understanding this.  She thinks the pump will deliver insulin when needed when she is eating during the day.  She has generally only one bolus per day, usually in the evenings  She does try to eat at least 2 meals and usually 3 and also some snacks including breakfast bars  She has started exercising only in the last 3 weeks when she is back in town, does not appear to have benefited from this.  However her weight is progressively improving.  Her basal rate is relatively low overnight and significantly high around midday and late evening  HOME glucose readings for the last 2 weeks from pump download:  Mean values apply above for all meters except median for One Touch  PRE-MEAL Fasting Lunch Dinner Bedtime Overall  Glucose range: 126-205   137-217  73-299    Mean/median:     183  POST-MEAL Overnight  PC Lunch PC Dinner  Glucose range: 72-144   73-219   Mean/median:        Mean values apply above for all meters except median for One Touch  PRE-MEAL Fasting Lunch Dinner Bedtime Overall  Glucose range: 118-218  75-228  86-205  54-273    Mean/median: 156    152  162+/-52     Meals: 2-3 meals per day, at 11 am, 3 pm and 7 pm Food preferences: She has oatmeal, toast frequently with egg for protein, usually has a sandwich and salad for lunch and vegetables for dinner with fish or lean meat for protein. Will snack on apples, raisons or wheat thins.     Wt Readings from Last 3 Encounters:  06/19/16 183 lb 6.4 oz (83.2 kg)  01/20/16 188 lb (85.3 kg)  12/02/15 194 lb (88 kg)   Physical activity: exercise: at Gym for 60 min, 3/7 days at 10 am since 3 weeks;  Dietician visit: Most recent: 2012    Lab Results  Component Value Date   HGBA1C 9.1 (H) 06/14/2016   HGBA1C 8.2 (H) 11/08/2015   HGBA1C 7.8 (H) 08/30/2015   Lab Results  Component Value Date   MICROALBUR 7.2 (H) 08/30/2015   LDLCALC 96 05/25/2015   CREATININE 1.18 06/14/2016       Medication List       Accurate as of 06/19/16  4:16 PM. Always use your most recent med list.          albuterol 108 (90 Base) MCG/ACT inhaler Commonly known as:  PROAIR HFA 2 puffs every 4 hours as needed only  if your can't catch your breath   amLODipine 10 MG tablet Commonly known as:  NORVASC Take 10 mg by mouth every evening.   BAYER CONTOUR NEXT TEST test strip Generic drug:  glucose blood use AS INSTRUCTED TO CHECK BLOOD SUGARS 7 TIMES A DAY   BAYER MICROLET LANCETS lancets Use as instructed to check blood sugar 7 times per day dx code E11.65   canagliflozin 300 MG Tabs tablet Commonly known as:  INVOKANA Take 1 tablet (300 mg total) by mouth daily before breakfast.   docusate sodium 100 MG capsule Commonly known as:  COLACE Take 100 mg by mouth 2 (two) times daily as needed for mild constipation.   gabapentin 300 MG capsule Commonly known as:  NEURONTIN Take 1 capsule (300 mg total) by mouth 3 (three) times daily.   glucagon 1 MG injection Commonly known as:  GLUCAGON EMERGENCY Inject 1 mg into the vein once as needed (for low blood sugar).   HUMALOG 100 UNIT/ML injection Generic drug:  insulin lispro USE MAX 60 UNITS PER DAY WITH INSULIN PUMP   hydrOXYzine 25 MG tablet Commonly known as:  ATARAX/VISTARIL Take 25 mg by mouth every 4 (four) hours as needed.   liraglutide 18 MG/3ML Sopn Commonly known as:  VICTOZA Inject 1.2 mg daily   losartan 50 MG  tablet Commonly known as:  COZAAR Take 1 tablet (50 mg total) by mouth daily.   metFORMIN 500 MG 24 hr tablet Commonly known as:  GLUCOPHAGE-XR Take 3 tablets (1,500 mg total) by mouth daily with supper.   metoprolol tartrate 25 MG tablet Commonly known as:  LOPRESSOR take 1 tablet by mouth twice a day   mometasone-formoterol 100-5 MCG/ACT Aero Commonly known as:  DULERA Take 2 puffs first thing in am and then another 2 puffs about 12 hours later.   oxybutynin 10  MG 24 hr tablet Commonly known as:  DITROPAN-XL Take 1 tablet by mouth daily. Take 1 tab by mouth daily   pantoprazole 40 MG tablet Commonly known as:  PROTONIX Take 30- 60 min before your first and last meals of the day   pravastatin 80 MG tablet Commonly known as:  PRAVACHOL Take 1 tablet (80 mg total) by mouth every morning.   spironolactone 50 MG tablet Commonly known as:  ALDACTONE take 1 tablet by mouth once daily   Vitamin D (Ergocalciferol) 50000 units Caps capsule Commonly known as:  DRISDOL Take 1 capsule by mouth once a week. Take 1 cap by mouth once a week       Allergies:  Allergies  Allergen Reactions  . Codeine Itching and Nausea And Vomiting    Past Medical History:  Diagnosis Date  . Arthritis    L knee, hands, back   . Asthma   . Diabetes mellitus type 2, insulin dependent ([place])   . Fibromyalgia   . Full dentures   . GERD (gastroesophageal reflux disease)   . H/O hiatal hernia   . Headache    h/o migraines - was followed for a time with a wellness doctor  . History of esophageal dilatation   . History of rhabdomyolysis    03/ 2015  . Hyperlipidemia   . Hypertension   . Insulin pump in place    since 12/ 2014--  [facility]  . Osteoarthritis of left knee, primary localized 08/17/2014  . Renal insufficiency   . SUI (stress urinary incontinence, female)   . Wears glasses     Past Surgical History:  Procedure Laterality Date  . BLADDER SUSPENSION N/A 03/09/2014   Procedure:  CYSTOSCOPY/SLING;  Surgeon: Reece Packer, MD;  Location: [facility];  Service: Urology;  Laterality: N/A;  . CARDIAC CATHETERIZATION  07-20-2009   DR Shelva Majestic   MODERATE LVH/  NORMAL  LVEF/  NORMAL CORONARY AND RENAL ARTERIES  . CARPAL TUNNEL RELEASE Right 2000  . CHOLECYSTECTOMY  1990  . COLONOSCOPY WITH ESOPHAGOGASTRODUODENOSCOPY (EGD)  06-18-2002  . ESOPHAGOGASTRODUODENOSCOPY (EGD) WITH ESOPHAGEAL DILATION  06-12-2010  . EXCISION LEFT UPPER ARM MASS  01-29-2014  . EYE SURGERY  2010   laser on R eye  . KNEE ARTHROSCOPY Right 1989  &  2002  . NEGATIVE SLEEP STUDY  2012   PER PT  . PARTIAL KNEE ARTHROPLASTY Left 08/17/2014   Procedure: LEFT UNICOMPARTMENTAL KNEE;  Surgeon: Johnny Bridge, MD;  Location: [facility];  Service: Orthopedics;  Laterality: Left;    Family History  Problem Relation Age of Onset  . Diabetes Mother   . Hypertension Mother   . Lung cancer Mother     smoked  . Diabetes Father   . Hypertension Father   . Diabetes Brother   . Heart failure Sister   . Diabetes Maternal Grandmother   . Cancer Maternal Grandmother   . Lung cancer Father     smoked    Social History:  reports that she has never smoked. She has never used smokeless tobacco. She reports that she does not drink alcohol or use drugs.  Review of Systems    Creatinine was as high as  1.6 previously and Now consistently normal Creatinine is better with stopping her HCTZ on her last visit and reducing losartan   Lab Results  Component Value Date   CREATININE 1.18 06/14/2016    HYPERTENSION:  This is better controlled, has also been followed  by PCP. She is taking amlodipine, losartan  and Aldactone 50 mg  BP at home [id number] systolic recently, Usually not high    Hypokalemia: Potassium 3.5 now.  She is on Aldactone 50 mg   Lab Results  Component Value Date   CREATININE 1.18 06/14/2016   BUN 16 06/14/2016   NA 137 06/14/2016   K 3.5 06/14/2016   CL 100 06/14/2016    CO2 28 06/14/2016     HYPERLIPIDEMIA:  LDL is at goal with using  pravastatin 80 mg    Lab Results  Component Value Date   CHOL 159 05/25/2015   HDL 47.50 05/25/2015   LDLCALC 96 05/25/2015   LDLDIRECT 152.8 10/09/2013   TRIG 78.0 05/25/2015   CHOLHDL 3 05/25/2015    NEUROPATHY: She is being treated with gabapentin for paresthesiae in feet, last foot exam in 10/16     Examination:   BP 122/84   Pulse 84   Temp 98.1 F (36.7 C)   Resp 14   Ht 5\' 4"  (1.626 m)   Wt 183 lb 6.4 oz (83.2 kg)   SpO2 97%   BMI 31.48 kg/m   Body mass index is 31.48 kg/m.     Assesment/PLAN:   Diabetes type 2, uncontrolled  See history of present illness for detailed discussion of her current management, problems identified and blood sugar patterns  Her blood sugars are poorly controlled with A1c 9.1 She has some insulin resistance, currently has a basal requirement of about 54 units The most significant problem is lack of bolusing with her meals and snacks, she is mostly bolusing for her evening meal only and not for the other 2 meals or snacks with carbohydrate She does not understand the concept of bolusing for all food intake Also at times will be bolusing postprandially Also not clear if she is consistently getting protein at breakfast  Her weight has come down despite not exercising regularly, may be benefiting from [place] Discussed basic principles of bolusing for meals, carbohydrates, high sugars Given booklet on carbohydrate counting. She will need to bolus at all meals and snacks even if she does not check her blood sugar at that time Needs to check blood sugars consistently and especially for the 2 weeks prior to her follow-up visit   Hypertension: This is  fairly well controlled with current regimen Creatinine is better with stopping her HCTZ on her last visit and reducing losartan Her potassium is borderline at 3.5 despite taking Aldactone 50 and will continue to  monitor   Counseling time on subjects discussed above is over 50% of today's 25 minute visit  Patient Instructions  Bolus 5-10 min before the meal  Bolus with any and all Carbs      Tayden Duran 06/19/2016, 4:16 PM

## 2016-06-19 NOTE — Patient Instructions (Signed)
Bolus 5-10 min before the meal  Bolus with any and all Carbs

## 2016-06-22 DIAGNOSIS — E1165 Type 2 diabetes mellitus with hyperglycemia: Secondary | ICD-10-CM | POA: Diagnosis not present

## 2016-07-03 ENCOUNTER — Ambulatory Visit (INDEPENDENT_AMBULATORY_CARE_PROVIDER_SITE_OTHER): Payer: Medicare Other | Admitting: Cardiovascular Disease

## 2016-07-03 ENCOUNTER — Encounter: Payer: Self-pay | Admitting: Cardiovascular Disease

## 2016-07-03 VITALS — BP 150/84 | Ht 64.0 in | Wt 182.0 lb

## 2016-07-03 DIAGNOSIS — E6609 Other obesity due to excess calories: Secondary | ICD-10-CM

## 2016-07-03 DIAGNOSIS — K222 Esophageal obstruction: Secondary | ICD-10-CM | POA: Diagnosis not present

## 2016-07-03 DIAGNOSIS — E66811 Obesity, class 1: Secondary | ICD-10-CM

## 2016-07-03 DIAGNOSIS — E7800 Pure hypercholesterolemia, unspecified: Secondary | ICD-10-CM

## 2016-07-03 DIAGNOSIS — I10 Essential (primary) hypertension: Secondary | ICD-10-CM

## 2016-07-03 MED ORDER — CARVEDILOL 12.5 MG PO TABS: 12.5000 mg | ORAL_TABLET | Freq: Two times a day (BID) | ORAL | 5 refills | Status: DC

## 2016-07-03 NOTE — Addendum Note (Signed)
Addended by: Alvina Filbert B on: 07/03/2016 05:43 PM   Modules accepted: Orders

## 2016-07-03 NOTE — Progress Notes (Signed)
Cardiology Office Note   Date:  07/03/2016   ID:  [name], [name], [name], MRN [id number]  PCP:  Maximino Greenland, MD  Cardiologist:   Skeet Latch, MD   Chief Complaint  Patient presents with  . Follow-up      History of Present Illness: [name] is a 65 y.o. female with hypertension, hyperlipidemia, asthma, diabetes mellitus type 2 who presents for follow up.  [name] was first seen 10/2015 with a report of intermittent episodes of chest pain and shortness of breath.  She was referred for an exercise Myoview 11/23/15 that was negative for ischemia and showed normal systolic function.  She had an echo 10/17/15 that revealed LVEF 65-70% with grade 1 diastolic dysfunction. Metoprolol to her regimen due to poorly controlled blood pressure. This improved both her blood pressure and her chest pain. At her last appointment she had started to increase her exercise regimen.  She continues to exercise by walking twice daily.  She is frustrated that she isn't losing weight quicker.  She is eating more vegetables and less fried foods.  However she continues to feel tired and is losing weight slowly.  She gets tired quickly when she uses the weight machines.  She notes that her blood pressure has been poorly controlled at home. Her blood sugars have also been erratic.  Her blood sugars of N in the 200s in the morning in the 50s at night.    Past Medical History:  Diagnosis Date  . Arthritis    L knee, hands, back   . Asthma   . Diabetes mellitus type 2, insulin dependent ([place])   . Fibromyalgia   . Full dentures   . GERD (gastroesophageal reflux disease)   . H/O hiatal hernia   . Headache    h/o migraines - was followed for a time with a wellness doctor  . History of esophageal dilatation   . History of rhabdomyolysis    03/ 2015  . Hyperlipidemia   . Hypertension   . Insulin pump in place    since 12/ 2014--  [facility]  . Osteoarthritis of left knee, primary  localized 08/17/2014  . Renal insufficiency   . SUI (stress urinary incontinence, female)   . Wears glasses     Past Surgical History:  Procedure Laterality Date  . BLADDER SUSPENSION N/A 03/09/2014   Procedure: CYSTOSCOPY/SLING;  Surgeon: Reece Packer, MD;  [facility];  Service: Urology;  Laterality: N/A;  . CARDIAC CATHETERIZATION  07-20-2009   DR Shelva Majestic   MODERATE LVH/  NORMAL  LVEF/  NORMAL CORONARY AND RENAL ARTERIES  . CARPAL TUNNEL RELEASE Right 2000  . CHOLECYSTECTOMY  1990  . COLONOSCOPY WITH ESOPHAGOGASTRODUODENOSCOPY (EGD)  06-18-2002  . ESOPHAGOGASTRODUODENOSCOPY (EGD) WITH ESOPHAGEAL DILATION  06-12-2010  . EXCISION LEFT UPPER ARM MASS  01-29-2014  . EYE SURGERY  2010   laser on R eye  . KNEE ARTHROSCOPY Right 1989  &  2002  . NEGATIVE SLEEP STUDY  2012   PER PT  . PARTIAL KNEE ARTHROPLASTY Left 08/17/2014   Procedure: LEFT UNICOMPARTMENTAL KNEE;  Surgeon: Johnny Bridge, MD;  Location: [place];  Service: Orthopedics;  Laterality: Left;    Current Outpatient Prescriptions  Medication Sig Dispense Refill  . albuterol (PROAIR HFA) 108 (90 Base) MCG/ACT inhaler 2 puffs every 4 hours as needed only  if your can't catch your breath 1 Inhaler 11  . amLODipine (NORVASC) 10 MG tablet  Take 10 mg by mouth every evening.     [name] BAYER CONTOUR NEXT TEST test strip use AS INSTRUCTED TO CHECK BLOOD SUGARS 7 TIMES A DAY 300 each 5  . BAYER MICROLET LANCETS lancets Use as instructed to check blood sugar 7 times per day dx code E11.65 300 each 5  . canagliflozin (INVOKANA) 300 MG TABS tablet Take 1 tablet (300 mg total) by mouth daily before breakfast. 30 tablet 3  . docusate sodium (COLACE) 100 MG capsule Take 100 mg by mouth 2 (two) times daily as needed for mild constipation.    . gabapentin (NEURONTIN) 300 MG capsule Take 1 capsule (300 mg total) by mouth 3 (three) times daily. 90 capsule 3  . glucagon (GLUCAGON EMERGENCY) 1 MG injection Inject 1  mg into the vein once as needed (for low blood sugar). 1 each 12  . HUMALOG 100 UNIT/ML injection USE MAX 60 UNITS PER DAY WITH INSULIN PUMP 20 vial 5  . hydrOXYzine (ATARAX/VISTARIL) 25 MG tablet Take 25 mg by mouth every 4 (four) hours as needed.   0  . Liraglutide (VICTOZA) [name] MG/3ML SOPN Inject 1.2 mg daily [name] mL 1  . losartan (COZAAR) 50 MG tablet Take 1 tablet (50 mg total) by mouth daily. 90 tablet 3  . metFORMIN (GLUCOPHAGE-XR) 500 MG 24 hr tablet Take 3 tablets (1,500 mg total) by mouth daily with supper. 90 tablet 3  . mometasone-formoterol (DULERA) 100-5 MCG/ACT AERO Take 2 puffs first thing in am and then another 2 puffs about 12 hours later. 1 Inhaler 11  . oxybutynin (DITROPAN-XL) 10 MG 24 hr tablet Take 1 tablet by mouth daily. Take 1 tab by mouth daily  1  . pantoprazole (PROTONIX) 40 MG tablet Take 30- 60 min before your first and last meals of the day 60 tablet 11  . pravastatin (PRAVACHOL) 80 MG tablet Take 1 tablet (80 mg total) by mouth every morning. 30 tablet 3  . spironolactone (ALDACTONE) 50 MG tablet take 1 tablet by mouth once daily 30 tablet 5  . Vitamin D, Ergocalciferol, (DRISDOL) 50000 units CAPS capsule Take 1 capsule by mouth once a week. Take 1 cap by mouth once a week  0  . carvedilol (COREG) 12.5 MG tablet Take 1 tablet (12.5 mg total) by mouth 2 (two) times daily. 60 tablet 5   No current facility-administered medications for this visit.     Allergies:   Codeine    Social History:  The patient  reports that she has never smoked. She has never used smokeless tobacco. She reports that she does not drink alcohol or use drugs.   Family History:  The patient's family history includes Cancer in her maternal grandmother; Diabetes in her brother, father, maternal grandmother, and mother; Heart failure in her sister; Hypertension in her father and mother; Lung cancer in her father and mother.    ROS:  Please see the history of present illness.   Otherwise, review  of systems are positive for none.   All other systems are reviewed and negative.   PHYSICAL EXAM: VS:  BP (!) 150/84 (BP Location: Right Arm, Patient Position: Sitting, Cuff Size: Large)   Ht 5\' 4"  (1.626 m)   Wt 82.6 kg (182 lb)   BMI 31.24 kg/m  , BMI Body mass index is 31.24 kg/m. GENERAL:  Well appearing HEENT:  Pupils equal round and reactive, fundi not visualized, oral mucosa unremarkable NECK:  No jugular venous distention, waveform within normal limits, carotid  upstroke brisk and symmetric, no bruits LYMPHATICS:  No cervical adenopathy LUNGS:  Clear to auscultation bilaterally HEART:  RRR.  PMI not displaced or sustained,S1 and S2 within normal limits, no S3, no S4, no clicks, no rubs, no murmurs ABD:  Flat, positive bowel sounds normal in frequency in pitch, no bruits, no rebound, no guarding, no midline pulsatile mass, no hepatomegaly, no splenomegaly EXT:  2 plus pulses throughout, no edema, no cyanosis no clubbing SKIN:  No rashes no nodules NEURO:  Cranial nerves II through XII grossly intact, motor grossly intact throughout PSYCH:  Cognitively intact, oriented to person place and time   EKG:  EKG is ordered today. The ekg ordered 07/03/16 demonstrates sinus rhythm rate 84 bpm.  Exercise Cardiolite 11/23/15:  The left ventricular ejection fraction is hyperdynamic (>65%).  Nuclear stress EF: 67%.  Blood pressure demonstrated a hypertensive response to exercise.  There was no ST segment deviation noted during stress.  The study is normal.  This is a low risk study.  Recent Labs: 06/14/2016: ALT 10; BUN 16; Creatinine, Ser 1.[name]; Potassium 3.5; Sodium 137    Lipid Panel    Component Value Date/Time   CHOL 159 05/25/2015 0911   TRIG 78.0 05/25/2015 0911   HDL 47.50 05/25/2015 0911   CHOLHDL 3 05/25/2015 0911   VLDL 15.6 05/25/2015 0911   LDLCALC 96 05/25/2015 0911   LDLDIRECT 152.8 10/09/2013 0938      Wt Readings from Last 3 Encounters:  07/03/16 82.6  kg (182 lb)  06/19/16 83.2 kg (183 lb 6.4 oz)  01/20/16 85.3 kg (188 lb)      ASSESSMENT AND PLAN:  # Hypertension: Blood pressure is elevated today and has been elevated at home.  We will stop metoprolol and start carvedilol 12.5 mg twice daily.    # Hyperlipidemia: Lipds are at goal.  Continue pravastatin.  We will repeat lipids at her follow-up appointment.  # Obesity: [name] was congratulated on her exercise and diet efforts. Although she is discouraged that she is not losing weight with her, she has lost 20 pounds in the last 10 months. This is to be commended. I encouraged her to keep up her activity and not be discouraged.  # Dysphagia: It sounds like Ms. Calandra's esophageal stricture is recurring. I recommended that she follow-up with her gastroenterologist to see if she needs her esophagus dilated again.  Current medicines are reviewed at length with the patient today.  The patient does not have concerns regarding medicines.  The following changes have been made: Stop metoprolol. Start carvedilol.   Labs/ tests ordered today include:   No orders of the defined types were placed in this encounter.    Disposition:   FU with Carlynn Leduc C. Oval Linsey, MD, [facility] in 1 month.   This note was written with the assistance of speech recognition software.  Please excuse any transcriptional errors.  Signed, Kenny Rea C. Oval Linsey, MD, [facility]  07/03/2016 1:29 PM    [facility]

## 2016-07-03 NOTE — Patient Instructions (Signed)
Medication Instructions:  STOP METOPROLOL   START CARVEDILOL 12.5 MG TWICE DAY  Labwork: NONE  Testing/Procedures: NONE  Follow-Up: Your physician recommends that you schedule a follow-up appointment in: 1 MONTH   If you need a refill on your cardiac medications before your next appointment, please call your pharmacy.

## 2016-07-09 DIAGNOSIS — Z9114 Patient's other noncompliance with medication regimen: Secondary | ICD-10-CM | POA: Diagnosis not present

## 2016-07-09 DIAGNOSIS — E1142 Type 2 diabetes mellitus with diabetic polyneuropathy: Secondary | ICD-10-CM | POA: Diagnosis not present

## 2016-07-09 DIAGNOSIS — I10 Essential (primary) hypertension: Secondary | ICD-10-CM | POA: Diagnosis not present

## 2016-07-09 DIAGNOSIS — R1310 Dysphagia, unspecified: Secondary | ICD-10-CM | POA: Diagnosis not present

## 2016-07-09 DIAGNOSIS — R0600 Dyspnea, unspecified: Secondary | ICD-10-CM | POA: Diagnosis not present

## 2016-07-20 ENCOUNTER — Other Ambulatory Visit: Payer: Self-pay | Admitting: Endocrinology

## 2016-07-27 ENCOUNTER — Other Ambulatory Visit: Payer: Medicare Other

## 2016-07-31 ENCOUNTER — Encounter: Payer: Self-pay | Admitting: Endocrinology

## 2016-07-31 ENCOUNTER — Ambulatory Visit (INDEPENDENT_AMBULATORY_CARE_PROVIDER_SITE_OTHER): Payer: Medicare Other | Admitting: Endocrinology

## 2016-07-31 VITALS — BP 140/80 | HR 62 | Ht 64.0 in | Wt 182.0 lb

## 2016-07-31 DIAGNOSIS — E1165 Type 2 diabetes mellitus with hyperglycemia: Secondary | ICD-10-CM | POA: Diagnosis not present

## 2016-07-31 DIAGNOSIS — Z794 Long term (current) use of insulin: Secondary | ICD-10-CM

## 2016-07-31 DIAGNOSIS — I10 Essential (primary) hypertension: Secondary | ICD-10-CM | POA: Diagnosis not present

## 2016-07-31 NOTE — Progress Notes (Addendum)
Patient ID: [name], female   DOB: July 06, 1951, 65 y.o.   MRN: [id number]   Reason for Appointment: Diabetes follow-up   History of Present Illness   Diagnosis: Type 2, date of diagnosis:1985.    PAST history: She has had difficult to control diabetes requiring large doses of insulin especially at meal times. Because of poor control she was switched from NovoLog to U-500 insulin in 11/2012. At that time her A1c was 9%  Initially seemed to have better blood sugars. With no clear response with Invokana this was stopped and she was started on Actos. Her insulin was adjusted with higher mealtime coverage at breakfast and lunch and lower amounts at supper. Lantus requirement appeared to be more in the morning and evening dose was eventually stopped. She was on Victoza and Invokana later in an attempt to improve her sugars but with only limited success.  Because of continued poor control with subcutaneous insulin and large insulin requirement she was switched to a Medtronic insulin pump which she started on 07/29/13 Prior to starting insulin pump her blood sugar at home was averaging 273 With the U 500 insulin in the pump her blood sugars were dramatically better with rare hypoglycemia  She was switched to NovoLog insulin in her pump instead of U-500 insulin in 3/15 because of  episode of severe overnight hypoglycemia  RECENT history:   Pump settings  Basal rate midnight = 0.7, 3 AM = 0.7, 6 AM = 2.5, 11 AM = 4.0, 3 PM = 2.0 and 9 PM = 3.0, daily total 57 units  Carbohydrate coverage 1:6, correction 1: 35 and target 110-120, insulin duration 4 hours Total daily insulin 62 units +/-5  Non-insulin hypoglycemic drugs: Victoza 1.2 mg daily, metformin ER 1500 mg daily, Invokana 300 mg daily  Her most recent A1c is 9.1, previously 8.2  Current blood sugar patterns and problems:   FASTING blood sugars are being checked at variable times, sometimes does not eat breakfast  She  still appears to be checking blood sugars mostly when she is planning to eat but sometimes after breakfast also  She has 3 days without any boluses are blood sugars  She says that she feels fairly good but may be irregular with her monitoring because of various issues  Not clear if she is bolusing for every meal or snack.  Also not sure if she knows how to bolus even without taking her blood sugar which she is not able to do consistently  Does not know how to review her bolus history  Although she is exercising regularly and sometimes twice a day she does not think it causes low sugars, does not currently know how to do a temporary basal.  No hypoglycemia, lowest blood sugar 74 late Sunday evening  HOME glucose readings for the last 2 weeks from pump download: Checking recently about 2.2 times a day  Mean values apply above for all meters except median for One Touch  PRE-MEAL Fasting Lunch Dinner Bedtime Overall  Glucose range: 153-264   170-267     Mean/median: 199    209 195   POST-MEAL PC Breakfast PC Lunch PC Dinner  Glucose range: 89-212   ?    Mean/median:        Meals: 2-3 meals per day, at 11 am, 3 pm and 7 pm Food preferences: She has oatmeal, toast frequently with egg for protein, usually has a sandwich and salad for lunch and vegetables for dinner with  fish or lean meat for protein.  Will snack on fiber bars, apples   Wt Readings from Last 3 Encounters:  07/31/16 182 lb (82.6 kg)  07/03/16 182 lb (82.6 kg)  06/19/16 183 lb 6.4 oz (83.2 kg)   Physical activity: exercise: at [facility] for 60 min, 3/7 days at 11 am and 5 pm   Dietician visit: Most recent: 2012    Lab Results  Component Value Date   HGBA1C 9.1 (H) 06/14/2016   HGBA1C 8.2 (H) 11/08/2015   HGBA1C 7.8 (H) 08/30/2015   Lab Results  Component Value Date   MICROALBUR 7.2 (H) 08/30/2015   LDLCALC 96 05/25/2015   CREATININE 1.18 06/14/2016       Medication List       Accurate as of 07/31/16  2:02  PM. Always use your most recent med list.          albuterol 108 (90 Base) MCG/ACT inhaler Commonly known as:  PROAIR HFA 2 puffs every 4 hours as needed only  if your can't catch your breath   amLODipine 10 MG tablet Commonly known as:  NORVASC Take 10 mg by mouth every evening.   BAYER CONTOUR NEXT TEST test strip Generic drug:  glucose blood use AS INSTRUCTED TO CHECK BLOOD SUGARS 7 TIMES A DAY   BAYER MICROLET LANCETS lancets Use as instructed to check blood sugar 7 times per day dx code E11.65   carvedilol 12.5 MG tablet Commonly known as:  COREG Take 1 tablet (12.5 mg total) by mouth 2 (two) times daily.   docusate sodium 100 MG capsule Commonly known as:  COLACE Take 100 mg by mouth 2 (two) times daily as needed for mild constipation.   gabapentin 300 MG capsule Commonly known as:  NEURONTIN Take 1 capsule (300 mg total) by mouth 3 (three) times daily.   glucagon 1 MG injection Commonly known as:  GLUCAGON EMERGENCY Inject 1 mg into the vein once as needed (for low blood sugar).   HUMALOG 100 UNIT/ML injection Generic drug:  insulin lispro USE MAX 60 UNITS PER DAY WITH INSULIN PUMP   hydrOXYzine 25 MG tablet Commonly known as:  ATARAX/VISTARIL Take 25 mg by mouth every 4 (four) hours as needed.   INVOKANA 300 MG Tabs tablet Generic drug:  canagliflozin take 1 tablet by mouth once daily BEFORE BREAKFAST   losartan 50 MG tablet Commonly known as:  COZAAR Take 1 tablet (50 mg total) by mouth daily.   metFORMIN 500 MG 24 hr tablet Commonly known as:  GLUCOPHAGE-XR Take 3 tablets (1,500 mg total) by mouth daily with supper.   mometasone-formoterol 100-5 MCG/ACT Aero Commonly known as:  DULERA Take 2 puffs first thing in am and then another 2 puffs about 12 hours later.   oxybutynin 10 MG 24 hr tablet Commonly known as:  DITROPAN-XL Take 1 tablet by mouth daily. Take 1 tab by mouth daily   pantoprazole 40 MG tablet Commonly known as:  PROTONIX Take  30- 60 min before your first and last meals of the day   pravastatin 80 MG tablet Commonly known as:  PRAVACHOL Take 1 tablet (80 mg total) by mouth every morning.   spironolactone 50 MG tablet Commonly known as:  ALDACTONE take 1 tablet by mouth once daily   VICTOZA 18 MG/3ML Sopn Generic drug:  liraglutide inject 1.2 milligram subcutaneously daily   Vitamin D (Ergocalciferol) 50000 units Caps capsule Commonly known as:  DRISDOL Take 1 capsule by mouth once a week.  Take 1 cap by mouth once a week       Allergies:  Allergies  Allergen Reactions  . Codeine Hives, Itching and Nausea And Vomiting    Past Medical History:  Diagnosis Date  . Arthritis    L knee, hands, back   . Asthma   . Diabetes mellitus type 2, insulin dependent ([place])   . Fibromyalgia   . Full dentures   . GERD (gastroesophageal reflux disease)   . H/O hiatal hernia   . Headache    h/o migraines - was followed for a time with a wellness doctor  . History of esophageal dilatation   . History of rhabdomyolysis    03/ 2015  . Hyperlipidemia   . Hypertension   . Insulin pump in place    since 12/ 2014--  [facility]  . Osteoarthritis of left knee, primary localized 08/17/2014  . Renal insufficiency   . SUI (stress urinary incontinence, female)   . Wears glasses     Past Surgical History:  Procedure Laterality Date  . BLADDER SUSPENSION N/A 03/09/2014   Procedure: CYSTOSCOPY/SLING;  Surgeon: Reece Packer, MD;  Location: [facility];  Service: Urology;  Laterality: N/A;  . CARDIAC CATHETERIZATION  07-20-2009   DR Shelva Majestic   MODERATE LVH/  NORMAL  LVEF/  NORMAL CORONARY AND RENAL ARTERIES  . CARPAL TUNNEL RELEASE Right 2000  . CHOLECYSTECTOMY  1990  . COLONOSCOPY WITH ESOPHAGOGASTRODUODENOSCOPY (EGD)  06-18-2002  . ESOPHAGOGASTRODUODENOSCOPY (EGD) WITH ESOPHAGEAL DILATION  06-12-2010  . EXCISION LEFT UPPER ARM MASS  01-29-2014  . EYE SURGERY  2010   laser on R eye  . KNEE  ARTHROSCOPY Right 1989  &  2002  . NEGATIVE SLEEP STUDY  2012   PER PT  . PARTIAL KNEE ARTHROPLASTY Left 08/17/2014   Procedure: LEFT UNICOMPARTMENTAL KNEE;  Surgeon: Johnny Bridge, MD;  Location: [place];  Service: Orthopedics;  Laterality: Left;    Family History  Problem Relation Age of Onset  . Diabetes Mother   . Hypertension Mother   . Lung cancer Mother     smoked  . Diabetes Father   . Hypertension Father   . Diabetes Brother   . Heart failure Sister   . Diabetes Maternal Grandmother   . Cancer Maternal Grandmother   . Lung cancer Father     smoked    Social History:  reports that she has never smoked. She has never used smokeless tobacco. She reports that she does not drink alcohol or use drugs.  Review of Systems    Creatinine was as high as 1.6 previously Creatinine is better with stopping her HCTZ  and reducing losartan   Lab Results  Component Value Date   CREATININE 1.18 06/14/2016    HYPERTENSION:  This is better controlled, has also been followed by PCP. She is taking amlodipine, losartan  and Aldactone 50 mg     Hypokalemia: Potassium 3.5 And not clear if this was low when checked by PCP.  She is on Aldactone 50 mg   Lab Results  Component Value Date   CREATININE 1.18 06/14/2016   BUN 16 06/14/2016   NA 137 06/14/2016   K 3.5 06/14/2016   CL 100 06/14/2016   CO2 28 06/14/2016     HYPERLIPIDEMIA:  LDL is at goal with using  pravastatin 80 mg    Lab Results  Component Value Date   CHOL 159 05/25/2015   HDL 47.50 05/25/2015   LDLCALC  96 05/25/2015   LDLDIRECT 152.8 10/09/2013   TRIG 78.0 05/25/2015   CHOLHDL 3 05/25/2015    NEUROPATHY: She is being treated with gabapentin for paresthesiae in feet, last foot exam in 10/16     Examination:   BP 140/80   Pulse 62   Ht 5\' 4"  (1.626 m)   Wt 182 lb (82.6 kg)   SpO2 98%   BMI 31.24 kg/m   Body mass index is 31.24 kg/m.    No ankle edema present  Assesment/PLAN:   Diabetes  type 2, uncontrolled  See history of present illness for detailed discussion of her current management, problems identified and blood sugar patterns  Although she is trying to do better with her glucose monitoring and boluses she is not consistent again She appears to be not knowledgeable about doing boluses especially without checking blood sugar Also not clear if she is bolusing consistently especially since she has 3 days without any boluses in the last 2 weeks FASTING blood sugars are generally high and also mostly high late evening No recent labs available to objectively check her sugar control  Discussed day-to-day management with her and also she will go with the technical part of bolusing with nurse educator today Encouraged her to check blood sugars more consistently at various times and at least once daily She does need to bolus even when she is not able to check her sugar at any meal or carbohydrate intake To call if blood sugars are getting low especially with exercise No change in Victoza or Invokana as yet  CHANGES in basal rates: Midnight = 0.75, 3 AM = 0.75, 6 AM = 2.9, 11 AM = 4.2, 3 PM = 2.3 and 7 PM = 3.0  Hypertension: This is  fairly well controlled with current regimen   Her potassium is borderline at 3.5 previously despite taking Aldactone 50 and will need to recheck this Also followed by PCP  Counseling time on subjects discussed above is over 50% of today's 25 minute visit  Patient Instructions  Check on waking up    [facility] 07/31/2016, 2:02 PM

## 2016-07-31 NOTE — Patient Instructions (Signed)
Check on waking up

## 2016-08-08 ENCOUNTER — Ambulatory Visit (INDEPENDENT_AMBULATORY_CARE_PROVIDER_SITE_OTHER): Payer: Medicare Other | Admitting: Cardiovascular Disease

## 2016-08-08 ENCOUNTER — Encounter: Payer: Self-pay | Admitting: Cardiovascular Disease

## 2016-08-08 VITALS — BP 134/76 | HR 76 | Ht 64.0 in | Wt 185.6 lb

## 2016-08-08 DIAGNOSIS — E7800 Pure hypercholesterolemia, unspecified: Secondary | ICD-10-CM | POA: Diagnosis not present

## 2016-08-08 DIAGNOSIS — I10 Essential (primary) hypertension: Secondary | ICD-10-CM | POA: Diagnosis not present

## 2016-08-08 DIAGNOSIS — E6609 Other obesity due to excess calories: Secondary | ICD-10-CM

## 2016-08-08 NOTE — Patient Instructions (Signed)
Medication Instructions:  STOP CARVEDILOL   START BYTSOLIC 20 MG DAILY   Labwork: NONE  Testing/Procedures: NONE  Follow-Up: Your physician recommends that you schedule a follow-up appointment in: 2 WEEKS WITH PHARM D FOR BLOOD PRESSURE  Your physician recommends that you schedule a follow-up appointment in: [place]  Any Other Special Instructions Will Be Listed Below (If Applicable). LOG YOU BLOOD PRESSURES AND BRING THAT AND YOUR BLOOD PRESSURE MACHINE TO YOUR FOLLOW UP VISIT WITH PHARM D  If you need a refill on your cardiac medications before your next appointment, please call your pharmacy.

## 2016-08-08 NOTE — Progress Notes (Signed)
Cardiology Office Note   Date:  08/08/2016   ID:  [name], [name] April 17, 1951, MRN [id number]  PCP:  Maximino Greenland, MD  Cardiologist:   Skeet Latch, MD   No chief complaint on file.     History of Present Illness: [name] is a 65 y.o. female with hypertension, hyperlipidemia, asthma, diabetes mellitus type 2 who presents for follow up.  [name] was first seen 10/2015 with a report of intermittent episodes of chest pain and shortness of breath.  She was referred for an exercise Myoview 11/23/15 that was negative for ischemia and showed normal systolic function.  She had an echo 10/17/15 that revealed LVEF 65-70% with grade 1 diastolic dysfunction.  At her follow up appointment her BP was elevated so metoprolol was switched to carvedilol.  Since her last appointment she has been doing well.  She notes that her blood pressure has been much better controlled. However she still has some readings as high as [id number] or [id number] systolic.  She no longer has shortness of breath or vision changes. Her headaches have also improved. She continues to exercise at the gym regularly. However she is discouraged because she continues to gain weight despite changing her diet and exercising.    Past Medical History:  Diagnosis Date  . Arthritis    L knee, hands, back   . Asthma   . Diabetes mellitus type 2, insulin dependent ([place])   . Fibromyalgia   . Full dentures   . GERD (gastroesophageal reflux disease)   . H/O hiatal hernia   . Headache    h/o migraines - was followed for a time with a wellness doctor  . History of esophageal dilatation   . History of rhabdomyolysis    03/ 2015  . Hyperlipidemia   . Hypertension   . Insulin pump in place    since 12/ 2014--  [facility]  . Osteoarthritis of left knee, primary localized 08/17/2014  . Renal insufficiency   . SUI (stress urinary incontinence, female)   . Wears glasses     Past Surgical History:  Procedure Laterality  Date  . BLADDER SUSPENSION N/A 03/09/2014   Procedure: CYSTOSCOPY/SLING;  Surgeon: Reece Packer, MD;  Location[facility];  Service: Urology;  Laterality: N/A;  . CARDIAC CATHETERIZATION  07-20-2009   DR Shelva Majestic   MODERATE LVH/  NORMAL  LVEF/  NORMAL CORONARY AND RENAL ARTERIES  . CARPAL TUNNEL RELEASE Right 2000  . CHOLECYSTECTOMY  1990  . COLONOSCOPY WITH ESOPHAGOGASTRODUODENOSCOPY (EGD)  06-18-2002  . ESOPHAGOGASTRODUODENOSCOPY (EGD) WITH ESOPHAGEAL DILATION  06-12-2010  . EXCISION LEFT UPPER ARM MASS  01-29-2014  . EYE SURGERY  2010   laser on R eye  . KNEE ARTHROSCOPY Right 1989  &  2002  . NEGATIVE SLEEP STUDY  2012   PER PT  . PARTIAL KNEE ARTHROPLASTY Left 08/17/2014   Procedure: LEFT UNICOMPARTMENTAL KNEE;  Surgeon: Johnny Bridge, MD;  Location: [place];  Service: Orthopedics;  Laterality: Left;    Current Outpatient Prescriptions  Medication Sig Dispense Refill  . albuterol (PROAIR HFA) 108 (90 Base) MCG/ACT inhaler 2 puffs every 4 hours as needed only  if your can't catch your breath 1 Inhaler 11  . amLODipine (NORVASC) 10 MG tablet Take 10 mg by mouth every evening.     [name] BAYER CONTOUR NEXT TEST test strip use AS INSTRUCTED TO CHECK BLOOD SUGARS 7 TIMES A DAY 300 each 5  . BAYER  MICROLET LANCETS lancets Use as instructed to check blood sugar 7 times per day dx code E11.65 300 each 5  . docusate sodium (COLACE) 100 MG capsule Take 100 mg by mouth 2 (two) times daily as needed for mild constipation.    . gabapentin (NEURONTIN) 300 MG capsule Take 1 capsule (300 mg total) by mouth 3 (three) times daily. 90 capsule 3  . glucagon (GLUCAGON EMERGENCY) 1 MG injection Inject 1 mg into the vein once as needed (for low blood sugar). 1 each 12  . HUMALOG 100 UNIT/ML injection USE MAX 60 UNITS PER DAY WITH INSULIN PUMP 20 vial 5  . hydrOXYzine (ATARAX/VISTARIL) 25 MG tablet Take 25 mg by mouth every 4 (four) hours as needed.   0  . INVOKANA 300 MG TABS tablet  take 1 tablet by mouth once daily BEFORE BREAKFAST 30 tablet 3  . losartan (COZAAR) 50 MG tablet Take 1 tablet (50 mg total) by mouth daily. 90 tablet 3  . metFORMIN (GLUCOPHAGE-XR) 500 MG 24 hr tablet Take 3 tablets (1,500 mg total) by mouth daily with supper. 90 tablet 3  . mometasone-formoterol (DULERA) 100-5 MCG/ACT AERO Take 2 puffs first thing in am and then another 2 puffs about 12 hours later. 1 Inhaler 11  . Nebivolol HCl (BYSTOLIC) 20 MG TABS Take 20 mg by mouth daily.    [name] oxybutynin (DITROPAN-XL) 10 MG 24 hr tablet Take 1 tablet by mouth daily. Take 1 tab by mouth daily  1  . pantoprazole (PROTONIX) 40 MG tablet Take 30- 60 min before your first and last meals of the day 60 tablet 11  . pravastatin (PRAVACHOL) 80 MG tablet Take 1 tablet (80 mg total) by mouth every morning. 30 tablet 3  . spironolactone (ALDACTONE) 50 MG tablet take 1 tablet by mouth once daily 30 tablet 5  . VICTOZA 18 MG/3ML SOPN inject 1.2 milligram subcutaneously daily 18 mL 1  . Vitamin D, Ergocalciferol, (DRISDOL) 50000 units CAPS capsule Take 1 capsule by mouth once a week. Take 1 cap by mouth once a week  0   No current facility-administered medications for this visit.     Allergies:   Codeine    Social History:  The patient  reports that she has never smoked. She has never used smokeless tobacco. She reports that she does not drink alcohol or use drugs.   Family History:  The patient's family history includes Cancer in her maternal grandmother; Diabetes in her brother, father, maternal grandmother, and mother; Heart failure in her sister; Hypertension in her father and mother; Lung cancer in her father and mother.    ROS:  Please see the history of present illness.   Otherwise, review of systems are positive for none.   All other systems are reviewed and negative.   PHYSICAL EXAM: VS:  BP 134/76   Pulse 76   Ht 5\' 4"  (1.626 m)   Wt 84.2 kg (185 lb 9.6 oz)   LMP  (LMP Unknown)   BMI 31.86 kg/m  ,  BMI Body mass index is 31.86 kg/m. GENERAL:  Well appearing HEENT:  Pupils equal round and reactive, fundi not visualized, oral mucosa unremarkable NECK:  No jugular venous distention, waveform within normal limits, carotid upstroke brisk and symmetric, no bruits LYMPHATICS:  No cervical adenopathy LUNGS:  Clear to auscultation bilaterally HEART:  RRR.  PMI not displaced or sustained,S1 and S2 within normal limits, no S3, no S4, no clicks, no rubs, no murmurs ABD:  Flat, positive bowel sounds normal in frequency in pitch, no bruits, no rebound, no guarding, no midline pulsatile mass, no hepatomegaly, no splenomegaly EXT:  2 plus pulses throughout, no edema, no cyanosis no clubbing SKIN:  No rashes no nodules NEURO:  Cranial nerves II through XII grossly intact, motor grossly intact throughout PSYCH:  Cognitively intact, oriented to person place and time   EKG:  EKG is ordered today. The ekg ordered 07/03/16 demonstrates sinus rhythm rate 84 bpm.  Exercise Cardiolite 11/23/15:  The left ventricular ejection fraction is hyperdynamic (>65%).  Nuclear stress EF: 67%.  Blood pressure demonstrated a hypertensive response to exercise.  There was no ST segment deviation noted during stress.  The study is normal.  This is a low risk study.  Recent Labs: 06/14/2016: ALT 10; BUN 16; Creatinine, Ser 1.18; Potassium 3.5; Sodium 137    Lipid Panel    Component Value Date/Time   CHOL 159 05/25/2015 0911   TRIG 78.0 05/25/2015 0911   HDL 47.50 05/25/2015 0911   CHOLHDL 3 05/25/2015 0911   VLDL 15.6 05/25/2015 0911   LDLCALC 96 05/25/2015 0911   LDLDIRECT 152.8 10/09/2013 0938      Wt Readings from Last 3 Encounters:  08/08/16 84.2 kg (185 lb 9.6 oz)  07/31/16 82.6 kg (182 lb)  07/03/16 82.6 kg (182 lb)      ASSESSMENT AND PLAN:  # Hypertension: Blood pressure is well-controlled.  Continue amlodipine, losartan and spironolactone.  We will stop carvedilol and start nebivolol  20mg  daily given that carvedilol can contribute to obesity.  I have asked her to bring her BP cuff to a follow up appointment with her pharmacist.  # Hyperlipidemia: Lipds are at goal.  Continue pravastatin.  We will repeat lipids at her follow-up appointment.  # Dysphagia: It sounds like Ms. Orihuela's esophageal stricture is recurring. Ms.Okun has follow up scheduled with GI next month.   Current medicines are reviewed at length with the patient today.  The patient does not have concerns regarding medicines.  The following changes have been made: Stop carvedilol.  Start nebivolol.   Labs/ tests ordered today include:   No orders of the defined types were placed in this encounter.    Disposition:   FU with Galia Rahm C. Oval Linsey, MD, [facility] in 2 months.  She will see our pharmacist in 2 weeks.    This note was written with the assistance of speech recognition software.  Please excuse any transcriptional errors.  Signed, Bijon Mineer C. Oval Linsey, MD, [facility]  08/08/2016 5:48 PM    [place]

## 2016-08-15 ENCOUNTER — Encounter: Payer: Medicare Other | Admitting: Nutrition

## 2016-08-29 ENCOUNTER — Encounter: Payer: Medicare Other | Admitting: Nutrition

## 2016-08-31 ENCOUNTER — Ambulatory Visit: Payer: Medicare Other

## 2016-09-07 ENCOUNTER — Other Ambulatory Visit (INDEPENDENT_AMBULATORY_CARE_PROVIDER_SITE_OTHER): Payer: Medicare Other

## 2016-09-07 DIAGNOSIS — E1165 Type 2 diabetes mellitus with hyperglycemia: Secondary | ICD-10-CM | POA: Diagnosis not present

## 2016-09-07 DIAGNOSIS — Z794 Long term (current) use of insulin: Secondary | ICD-10-CM | POA: Diagnosis not present

## 2016-09-07 LAB — MICROALBUMIN / CREATININE URINE RATIO
CREATININE, U: 271.2 mg/dL
MICROALB UR: 6.9 mg/dL — AB (ref 0.0–1.9)
MICROALB/CREAT RATIO: 2.5 mg/g (ref 0.0–30.0)

## 2016-09-07 LAB — COMPREHENSIVE METABOLIC PANEL
ALT: 11 U/L (ref 0–35)
AST: 15 U/L (ref 0–37)
Albumin: 3.7 g/dL (ref 3.5–5.2)
Alkaline Phosphatase: 92 U/L (ref 39–117)
BUN: 19 mg/dL (ref 6–23)
CHLORIDE: 105 meq/L (ref 96–112)
CO2: 28 meq/L (ref 19–32)
Calcium: 9.4 mg/dL (ref 8.4–10.5)
Creatinine, Ser: 1.4 mg/dL — ABNORMAL HIGH (ref 0.40–1.20)
GFR: 48.66 mL/min — AB (ref >60.00)
GLUCOSE: 128 mg/dL — AB (ref 70–99)
POTASSIUM: 3.5 meq/L (ref 3.5–5.1)
Sodium: 139 mEq/L (ref 135–145)
Total Bilirubin: 0.6 mg/dL (ref 0.2–1.2)
Total Protein: 6.8 g/dL (ref 6.0–8.3)

## 2016-09-07 LAB — LIPID PANEL
Cholesterol: 185 mg/dL (ref 0–200)
HDL: 49.1 mg/dL (ref >39.00)
LDL Cholesterol: 120 mg/dL — ABNORMAL HIGH (ref 0–99)
NONHDL: 136.03
Total CHOL/HDL Ratio: 4
Triglycerides: 78 mg/dL (ref 0.0–149.0)
VLDL: 15.6 mg/dL (ref 0.0–40.0)

## 2016-09-07 LAB — HEMOGLOBIN A1C: HEMOGLOBIN A1C: 8.4 % — AB (ref 4.6–6.5)

## 2016-09-11 ENCOUNTER — Ambulatory Visit: Payer: Medicare Other | Admitting: Endocrinology

## 2016-09-13 ENCOUNTER — Ambulatory Visit: Payer: Medicare Other

## 2016-09-20 ENCOUNTER — Ambulatory Visit: Payer: Medicare Other

## 2016-10-01 DIAGNOSIS — E1165 Type 2 diabetes mellitus with hyperglycemia: Secondary | ICD-10-CM | POA: Diagnosis not present

## 2016-10-11 IMAGING — NM NM MISC PROCEDURE
6 series · 36 of 36 positions shown · non-contrast
Comparison: none

[Series 1: wbr rest · 6.40mm/px · 6 of 64 frames shown]
[frame 6/64]
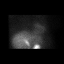
[frame 16/64]
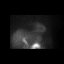
[frame 27/64]
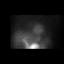
[frame 38/64]
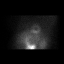
[frame 48/64]
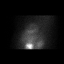
[frame 59/64]
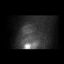

[Series 1: wbr_r-proj_st wbr rest · 6.40mm/px · 6 of 64 frames shown]
[frame 6/64]
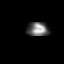
[frame 16/64]
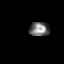
[frame 27/64]
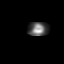
[frame 38/64]
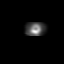
[frame 48/64]
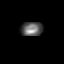
[frame 59/64]
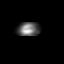

[Series 2: wbr stress-gsp · 6.40mm/px · 6 of 512 frames shown]
[frame 43/512  full-range]
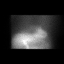
[frame 128/512  full-range]
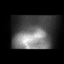
[frame 214/512  full-range]
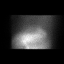
[frame 299/512  full-range]
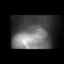
[frame 384/512  full-range]
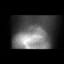
[frame 470/512  full-range]
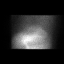

[Series 2: wbr_s-proj_st wbr stress-gsp · 6.40mm/px · 6 of 512 frames shown]
[frame 43/512]
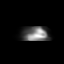
[frame 128/512]
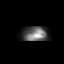
[frame 214/512]
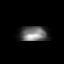
[frame 299/512]
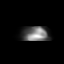
[frame 384/512]
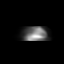
[frame 470/512]
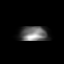

[Series 3: wbr stress-sum-em · 6.40mm/px · 6 of 64 frames shown]
[frame 6/64]
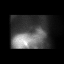
[frame 16/64]
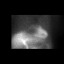
[frame 27/64]
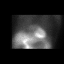
[frame 38/64]
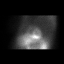
[frame 48/64]
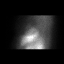
[frame 59/64]
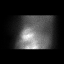

[Series 3: wbr_s-proj_st wbr stress-sum-em · 6.40mm/px · 6 of 64 frames shown]
[frame 6/64]
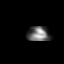
[frame 16/64]
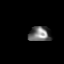
[frame 27/64]
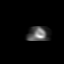
[frame 38/64]
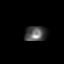
[frame 48/64]
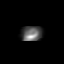
[frame 59/64]
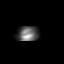

[36 of 36 positions shown; findings below may reference images not displayed]

Canned report from images found in remote index.

Refer to host system for actual result text.

## 2016-10-17 ENCOUNTER — Ambulatory Visit: Payer: Medicare Other | Admitting: Cardiovascular Disease

## 2016-11-08 DIAGNOSIS — K582 Mixed irritable bowel syndrome: Secondary | ICD-10-CM | POA: Diagnosis not present

## 2016-11-08 DIAGNOSIS — R1310 Dysphagia, unspecified: Secondary | ICD-10-CM | POA: Diagnosis not present

## 2016-11-08 DIAGNOSIS — Z1231 Encounter for screening mammogram for malignant neoplasm of breast: Secondary | ICD-10-CM | POA: Diagnosis not present

## 2016-11-08 DIAGNOSIS — K219 Gastro-esophageal reflux disease without esophagitis: Secondary | ICD-10-CM | POA: Diagnosis not present

## 2016-11-08 DIAGNOSIS — M2550 Pain in unspecified joint: Secondary | ICD-10-CM | POA: Diagnosis not present

## 2016-11-08 DIAGNOSIS — H6122 Impacted cerumen, left ear: Secondary | ICD-10-CM | POA: Diagnosis not present

## 2016-11-08 DIAGNOSIS — E785 Hyperlipidemia, unspecified: Secondary | ICD-10-CM | POA: Diagnosis not present

## 2016-11-08 DIAGNOSIS — R4702 Dysphasia: Secondary | ICD-10-CM | POA: Diagnosis not present

## 2016-11-08 DIAGNOSIS — Z1211 Encounter for screening for malignant neoplasm of colon: Secondary | ICD-10-CM | POA: Diagnosis not present

## 2016-11-08 DIAGNOSIS — Z0000 Encounter for general adult medical examination without abnormal findings: Secondary | ICD-10-CM | POA: Diagnosis not present

## 2016-11-08 DIAGNOSIS — E1142 Type 2 diabetes mellitus with diabetic polyneuropathy: Secondary | ICD-10-CM | POA: Diagnosis not present

## 2016-12-05 ENCOUNTER — Other Ambulatory Visit: Payer: Self-pay

## 2016-12-14 DIAGNOSIS — K2970 Gastritis, unspecified, without bleeding: Secondary | ICD-10-CM | POA: Diagnosis not present

## 2016-12-14 DIAGNOSIS — Z800 Family history of malignant neoplasm of digestive organs: Secondary | ICD-10-CM | POA: Diagnosis not present

## 2016-12-14 DIAGNOSIS — K219 Gastro-esophageal reflux disease without esophagitis: Secondary | ICD-10-CM | POA: Diagnosis not present

## 2016-12-14 DIAGNOSIS — K222 Esophageal obstruction: Secondary | ICD-10-CM | POA: Diagnosis not present

## 2016-12-14 DIAGNOSIS — K208 Other esophagitis: Secondary | ICD-10-CM | POA: Diagnosis not present

## 2016-12-14 DIAGNOSIS — R1310 Dysphagia, unspecified: Secondary | ICD-10-CM | POA: Diagnosis not present

## 2016-12-14 DIAGNOSIS — R1013 Epigastric pain: Secondary | ICD-10-CM | POA: Diagnosis not present

## 2016-12-14 DIAGNOSIS — Z1211 Encounter for screening for malignant neoplasm of colon: Secondary | ICD-10-CM | POA: Diagnosis not present

## 2016-12-28 ENCOUNTER — Telehealth: Payer: Self-pay | Admitting: *Deleted

## 2016-12-28 NOTE — Telephone Encounter (Signed)
Courtney from [facility] called regarding a prescription request on omega 3. Please call Courtney at [phone]

## 2016-12-28 NOTE — Telephone Encounter (Signed)
Dr. [name] is not PCP for this patient and does not prescribe Omega 3 for the patient.

## 2017-01-02 ENCOUNTER — Telehealth: Payer: Self-pay

## 2017-01-02 NOTE — Telephone Encounter (Signed)
Pt returning your call

## 2017-01-02 NOTE — Telephone Encounter (Signed)
I called patient back and she said she is getting her strips from [facility].

## 2017-01-02 NOTE — Telephone Encounter (Signed)
Called patient to verify if she is getting her Contour strips from the [facility]. Was not able to reach her so left a voice message to call us back to confirm.

## 2017-01-03 DIAGNOSIS — K219 Gastro-esophageal reflux disease without esophagitis: Secondary | ICD-10-CM | POA: Diagnosis not present

## 2017-01-03 DIAGNOSIS — R1310 Dysphagia, unspecified: Secondary | ICD-10-CM | POA: Diagnosis not present

## 2017-01-03 DIAGNOSIS — K5730 Diverticulosis of large intestine without perforation or abscess without bleeding: Secondary | ICD-10-CM | POA: Diagnosis not present

## 2017-01-09 ENCOUNTER — Telehealth: Payer: Self-pay

## 2017-01-09 DIAGNOSIS — E1165 Type 2 diabetes mellitus with hyperglycemia: Secondary | ICD-10-CM | POA: Diagnosis not present

## 2017-01-09 NOTE — Telephone Encounter (Signed)
Called patient to see if she still used the refills sent to [place] from [facility] and she said she does still use everything they send to her.

## 2017-02-11 ENCOUNTER — Other Ambulatory Visit: Payer: Self-pay | Admitting: Endocrinology

## 2017-02-11 NOTE — Telephone Encounter (Signed)
Last ov 07/31/16 no future scheduled refill or refuse?

## 2017-02-11 NOTE — Telephone Encounter (Signed)
Please give her one insulin vial with note to make appointment

## 2017-02-11 NOTE — Telephone Encounter (Signed)
Done

## 2017-02-15 NOTE — Telephone Encounter (Signed)
Rep, Caryl Pina from [facility] is calling to have Dr. [name] sign off on several scripts.  Please advise,  -LL

## 2017-02-20 ENCOUNTER — Other Ambulatory Visit: Payer: Self-pay | Admitting: Endocrinology

## 2017-02-20 ENCOUNTER — Other Ambulatory Visit (INDEPENDENT_AMBULATORY_CARE_PROVIDER_SITE_OTHER): Payer: Medicare Other

## 2017-02-20 DIAGNOSIS — Z794 Long term (current) use of insulin: Secondary | ICD-10-CM | POA: Diagnosis not present

## 2017-02-20 DIAGNOSIS — E1165 Type 2 diabetes mellitus with hyperglycemia: Secondary | ICD-10-CM

## 2017-02-20 LAB — BASIC METABOLIC PANEL
BUN: 16 mg/dL (ref 6–23)
CHLORIDE: 99 meq/L (ref 96–112)
CO2: 29 mEq/L (ref 19–32)
Calcium: 9.8 mg/dL (ref 8.4–10.5)
Creatinine, Ser: 1.1 mg/dL (ref 0.40–1.20)
GFR: 64.19 mL/min (ref >60.00)
GLUCOSE: 191 mg/dL — AB (ref 70–99)
POTASSIUM: 3.9 meq/L (ref 3.5–5.1)
SODIUM: 135 meq/L (ref 135–145)

## 2017-02-20 LAB — HEMOGLOBIN A1C: Hgb A1c MFr Bld: 9 % — ABNORMAL HIGH (ref 4.6–6.5)

## 2017-02-26 ENCOUNTER — Ambulatory Visit: Payer: Medicare Other | Admitting: Endocrinology

## 2017-03-01 ENCOUNTER — Telehealth: Payer: Self-pay | Admitting: Endocrinology

## 2017-03-01 ENCOUNTER — Other Ambulatory Visit: Payer: Self-pay

## 2017-03-01 MED ORDER — INSULIN LISPRO 100 UNIT/ML ~~LOC~~ SOLN: SUBCUTANEOUS | 0 refills | Status: DC

## 2017-03-01 NOTE — Telephone Encounter (Signed)
Patient called to advise that she is using her last vial of insulin today. She will be out until august 6. Needing a renewal called in. Verified [facility] on file.

## 2017-03-01 NOTE — Telephone Encounter (Signed)
Called patient and let her know that I have sent in a refill for her Humalog until she can see [place] in August.

## 2017-04-01 ENCOUNTER — Other Ambulatory Visit: Payer: Self-pay

## 2017-04-01 ENCOUNTER — Ambulatory Visit (INDEPENDENT_AMBULATORY_CARE_PROVIDER_SITE_OTHER): Payer: Medicare Other | Admitting: Endocrinology

## 2017-04-01 ENCOUNTER — Encounter: Payer: Self-pay | Admitting: Endocrinology

## 2017-04-01 VITALS — BP 148/100 | HR 97 | Ht 64.0 in | Wt 174.0 lb

## 2017-04-01 DIAGNOSIS — Z794 Long term (current) use of insulin: Secondary | ICD-10-CM

## 2017-04-01 DIAGNOSIS — E1165 Type 2 diabetes mellitus with hyperglycemia: Secondary | ICD-10-CM

## 2017-04-01 DIAGNOSIS — I10 Essential (primary) hypertension: Secondary | ICD-10-CM | POA: Diagnosis not present

## 2017-04-01 MED ORDER — INSULIN LISPRO 100 UNIT/ML ~~LOC~~ SOLN: SUBCUTANEOUS | 3 refills | Status: DC

## 2017-04-01 NOTE — Progress Notes (Signed)
Patient ID: [name], female   DOB: 02-16-1951, 66 y.o.   MRN: [id number]   Reason for Appointment: Diabetes follow-up   History of Present Illness   Diagnosis: Type 2, date of diagnosis:1985.    PAST history: She has had difficult to control diabetes requiring large doses of insulin especially at meal times. Because of poor control she was switched from NovoLog to U-500 insulin in 11/2012. At that time her A1c was 9%  Initially seemed to have better blood sugars. With no clear response with Invokana this was stopped and she was started on Actos. Her insulin was adjusted with higher mealtime coverage at breakfast and lunch and lower amounts at supper. Lantus requirement appeared to be more in the morning and evening dose was eventually stopped. She was on Victoza and Invokana later in an attempt to improve her sugars but with only limited success.  Because of continued poor control with subcutaneous insulin and large insulin requirement she was switched to a Medtronic insulin pump which she started on 07/29/13 Prior to starting insulin pump her blood sugar at home was averaging 273 With the U 500 insulin in the pump her blood sugars were dramatically better with rare hypoglycemia  She was switched to NovoLog insulin in her pump instead of U-500 insulin in 3/15 because of  episode of severe overnight hypoglycemia  RECENT history:   Pump settings  Basal rate midnight = 0.75, 6 AM = 2.9, 11 AM = 4.2, 3 PM = 2.3 and 7 PM = 3.0, daily total 60 units  Carbohydrate coverage 1:6, correction 1: 35 and target 110-120, insulin duration 4 hours Total daily insulin 62 units +/-5  Non-insulin hypoglycemic drugs: Victoza 1.2 mg daily, metformin ER 1500 mg daily, Invokana 300 mg daily  Her most recent A1c is 9, Previous range.8.2-9.1  She has not been seen in follow-up since 07/2016  Current blood sugar patterns and problems:   Her BASAL rates were increased on her last visit  because of inadequate control  However she has not followed up consistently  More recently FASTING blood sugars are not being checked except 3 or 4 readings about 10 days ago  Sheis that she is not bolusing because she tends to run out of insulin and she did not let us know that  Currently her BOLUS amount is only about 12% of her total insulin  She has a couple of days where she has not bolus at all  More recently is missing boluses in the mornings and suppertime and probably bolusing AFTER eating in the evenings at times  She was previously explained that she can bolusing if she has not checked her sugar but she still does not do so  She is still exercising regularly but not clear if this is affecting her sugars when she is active  No hypoglycemia, lowest blood sugar 80 after a bolus  HOME glucose readings for the last 2 weeks from pump download: Checking recently about 1 times a day  FASTING blood sugars: 1 47-215  Late afternoon 1 99-225 Postprandial and after supper 80-282 Overall AVERAGE 204 with 14 readings   Meals: 2-3 meals per day, at 11 am, 3 pm and 7 pm Food preferences: She has oatmeal, toast frequently with egg for protein, usually has a sandwich and salad for lunch and vegetables for dinner with fish or lean meat for protein.   Will snack on fiber bars, some fruit, drinking some juice, low sugar  Wt  Readings from Last 3 Encounters:  04/01/17 174 lb (78.9 kg)  08/08/16 185 lb 9.6 oz (84.2 kg)  07/31/16 182 lb (82.6 kg)   Physical activity: exercise: at Gym for 60 min, 3/7 days at 11 am and 5 pm   Dietician visit: Most recent: 2012    Lab Results  Component Value Date   HGBA1C 9.0 (H) 02/20/2017   HGBA1C 8.4 (H) 09/07/2016   HGBA1C 9.1 (H) 06/14/2016   Lab Results  Component Value Date   MICROALBUR 6.9 (H) 09/07/2016   LDLCALC 120 (H) 09/07/2016   CREATININE 1.10 02/20/2017     Allergies as of 04/01/2017      Reactions   Codeine Hives, Itching,  Nausea And Vomiting      Medication List       Accurate as of 04/01/17  4:51 PM. Always use your most recent med list.          albuterol 108 (90 Base) MCG/ACT inhaler Commonly known as:  PROAIR HFA 2 puffs every 4 hours as needed only  if your can't catch your breath   amLODipine 10 MG tablet Commonly known as:  NORVASC Take 10 mg by mouth every evening.   BAYER CONTOUR NEXT TEST test strip Generic drug:  glucose blood use AS INSTRUCTED TO CHECK BLOOD SUGARS 7 TIMES A DAY   BAYER MICROLET LANCETS lancets Use as instructed to check blood sugar 7 times per day dx code K74.25   BYSTOLIC 20 MG Tabs Generic drug:  Nebivolol HCl Take 20 mg by mouth daily.   docusate sodium 100 MG capsule Commonly known as:  COLACE Take 100 mg by mouth 2 (two) times daily as needed for mild constipation.   gabapentin 300 MG capsule Commonly known as:  NEURONTIN Take 1 capsule (300 mg total) by mouth 3 (three) times daily.   glucagon 1 MG injection Commonly known as:  GLUCAGON EMERGENCY Inject 1 mg into the vein once as needed (for low blood sugar).   hydrOXYzine 25 MG tablet Commonly known as:  ATARAX/VISTARIL Take 25 mg by mouth every 4 (four) hours as needed.   insulin lispro 100 UNIT/ML injection Commonly known as:  HUMALOG USE MAX 90 UNITS subcutaneously once daily WITH INSULIN PUMP   INVOKANA 300 MG Tabs tablet Generic drug:  canagliflozin take 1 tablet by mouth once daily BEFORE BREAKFAST   losartan 50 MG tablet Commonly known as:  COZAAR Take 1 tablet (50 mg total) by mouth daily.   metFORMIN 500 MG 24 hr tablet Commonly known as:  GLUCOPHAGE-XR Take 3 tablets (1,500 mg total) by mouth daily with supper.   mometasone-formoterol 100-5 MCG/ACT Aero Commonly known as:  DULERA Take 2 puffs first thing in am and then another 2 puffs about 12 hours later.   oxybutynin 10 MG 24 hr tablet Commonly known as:  DITROPAN-XL Take 1 tablet by mouth daily. Take 1 tab by mouth  daily   pantoprazole 40 MG tablet Commonly known as:  PROTONIX Take 30- 60 min before your first and last meals of the day   pravastatin 80 MG tablet Commonly known as:  PRAVACHOL Take 1 tablet (80 mg total) by mouth every morning.   spironolactone 50 MG tablet Commonly known as:  ALDACTONE take 1 tablet by mouth once daily   sucralfate 1 g tablet Commonly known as:  CARAFATE Take 1 g by mouth 4 (four) times daily.   VICTOZA 18 MG/3ML Sopn Generic drug:  liraglutide inject 1.2 milligram subcutaneously daily  Vitamin D (Ergocalciferol) 50000 units Caps capsule Commonly known as:  DRISDOL Take 1 capsule by mouth once a week. Take 1 cap by mouth once a week       Allergies:  Allergies  Allergen Reactions  . Codeine Hives, Itching and Nausea And Vomiting    Past Medical History:  Diagnosis Date  . Arthritis    L knee, hands, back   . Asthma   . Diabetes mellitus type 2, insulin dependent ([place])   . Fibromyalgia   . Full dentures   . GERD (gastroesophageal reflux disease)   . H/O hiatal hernia   . Headache    h/o migraines - was followed for a time with a wellness doctor  . History of esophageal dilatation   . History of rhabdomyolysis    03/ 2015  . Hyperlipidemia   . Hypertension   . Insulin pump in place    since 12/ 2014--  [facility]  . Osteoarthritis of left knee, primary localized 08/17/2014  . Renal insufficiency   . SUI (stress urinary incontinence, female)   . Wears glasses     Past Surgical History:  Procedure Laterality Date  . BLADDER SUSPENSION N/A 03/09/2014   Procedure: CYSTOSCOPY/SLING;  Surgeon: Reece Packer, MD;  Location: [facility];  Service: Urology;  Laterality: N/A;  . CARDIAC CATHETERIZATION  07-20-2009   DR Shelva Majestic   MODERATE LVH/  NORMAL  LVEF/  NORMAL CORONARY AND RENAL ARTERIES  . CARPAL TUNNEL RELEASE Right 2000  . CHOLECYSTECTOMY  1990  . COLONOSCOPY WITH ESOPHAGOGASTRODUODENOSCOPY (EGD)  06-18-2002   . ESOPHAGOGASTRODUODENOSCOPY (EGD) WITH ESOPHAGEAL DILATION  06-12-2010  . EXCISION LEFT UPPER ARM MASS  01-29-2014  . EYE SURGERY  2010   laser on R eye  . KNEE ARTHROSCOPY Right 1989  &  2002  . NEGATIVE SLEEP STUDY  2012   PER PT  . PARTIAL KNEE ARTHROPLASTY Left 08/17/2014   Procedure: LEFT UNICOMPARTMENTAL KNEE;  Surgeon: Johnny Bridge, MD;  Location: [facility];  Service: Orthopedics;  Laterality: Left;    Family History  Problem Relation Age of Onset  . Diabetes Mother   . Hypertension Mother   . Lung cancer Mother        smoked  . Diabetes Father   . Hypertension Father   . Diabetes Brother   . Heart failure Sister   . Diabetes Maternal Grandmother   . Cancer Maternal Grandmother   . Lung cancer Father        smoked    Social History:  reports that she has never smoked. She has never used smokeless tobacco. She reports that she does not drink alcohol or use drugs.  Review of Systems    Creatinine was as high as 1.6 previously Creatinine is better with stopping her HCTZ  and reducing losartan   Lab Results  Component Value Date   CREATININE 1.10 02/20/2017    HYPERTENSION:  This is Previously better controlled, has also been followed by PCP. She is taking amlodipine, losartan  and Aldactone 50 mg However she has not taken her amlodipine for 1 week and blood pressure is high today    Hypokalemia: Potassium 3.9, has been stable   She is on Aldactone 50 mg   Lab Results  Component Value Date   CREATININE 1.10 02/20/2017   BUN 16 02/20/2017   NA 135 02/20/2017   K 3.9 02/20/2017   CL 99 02/20/2017   CO2 29 02/20/2017  HYPERLIPIDEMIA:  Taking pravastatin 80 mg, followed by PCP   Lab Results  Component Value Date   CHOL 185 09/07/2016   HDL 49.10 09/07/2016   LDLCALC 120 (H) 09/07/2016   LDLDIRECT 152.8 10/09/2013   TRIG 78.0 09/07/2016   CHOLHDL 4 09/07/2016    NEUROPATHY: She is being treated with gabapentin for paresthesiae in feet, last  foot exam in 10/16     Examination:   BP (!) 148/100   Pulse 97   Ht 5\' 4"  (1.626 m)   Wt 174 lb (78.9 kg)   LMP  (LMP Unknown)   SpO2 98%   BMI 29.87 kg/m   Body mass index is 29.87 kg/m.    No ankle edema present  Assesment/PLAN:   Diabetes type 2, uncontrolled  See history of present illness for detailed discussion of her current management, problems identified and blood sugar patterns  As discussed above her A1c is still higher around 9% She is still not able to understand how to manage her diabetes on her pump She now said that she tends to run out of insulin before the end of the month and is not taking boluses because of this Currently taking 60 units on an average but only 12% of insulin is bolus amount Glucose monitoring has been suboptimal  Again discussed day-to-day management of boluses and compliance with monitoring Discussed bolusing for all carbohydrates eaten before the meal regardless of whether she has checked her blood sugar or not She will also monitor blood sugars more consistently before and after exercise  Most likely she will need a higher amount of insulin per day and will allow her to get 3 bottles of insulin per month Since there is not enough glucose monitoring data especially for fasting blood sugars will not change her basal rates today  Hypertension: Blood pressure is high because of running out of amlodipine She will follow-up with PCP for prescription and blood pressure checked again  LIPIDS: Will need to recheck, most likely she needs to switch from pravastatin because the last LDL was over 100    Counseling time on subjects discussed above is over 50% of today's 25 minute visit  Patient Instructions  Check blood sugars on waking up  Daily and at each meal  Also check blood sugars about 2 hours after a meal and do this after different meals by rotation  Recommended blood sugar levels on waking up is 90-130 and about 2 hours after  meal is 130-160  Please bring your blood sugar monitor to each visit, thank you  Bolus for all carbs eaten    [facility] 04/01/2017, 4:51 PM

## 2017-04-01 NOTE — Patient Instructions (Addendum)
Check blood sugars on waking up  Daily and at each meal  Also check blood sugars about 2 hours after a meal and do this after different meals by rotation  Recommended blood sugar levels on waking up is 90-130 and about 2 hours after meal is 130-160  Please bring your blood sugar monitor to each visit, thank you  Bolus for all carbs eaten

## 2017-04-19 DIAGNOSIS — E1165 Type 2 diabetes mellitus with hyperglycemia: Secondary | ICD-10-CM | POA: Diagnosis not present

## 2017-05-17 ENCOUNTER — Other Ambulatory Visit (INDEPENDENT_AMBULATORY_CARE_PROVIDER_SITE_OTHER): Payer: Medicare Other

## 2017-05-17 DIAGNOSIS — E1165 Type 2 diabetes mellitus with hyperglycemia: Secondary | ICD-10-CM

## 2017-05-17 DIAGNOSIS — Z794 Long term (current) use of insulin: Secondary | ICD-10-CM | POA: Diagnosis not present

## 2017-05-17 LAB — BASIC METABOLIC PANEL
BUN: 9 mg/dL (ref 6–23)
CALCIUM: 9.4 mg/dL (ref 8.4–10.5)
CO2: 31 mEq/L (ref 19–32)
CREATININE: 1.34 mg/dL — AB (ref 0.40–1.20)
Chloride: 101 mEq/L (ref 96–112)
GFR: 51.07 mL/min — AB (ref >60.00)
GLUCOSE: 251 mg/dL — AB (ref 70–99)
POTASSIUM: 3.7 meq/L (ref 3.5–5.1)
Sodium: 140 mEq/L (ref 135–145)

## 2017-05-20 ENCOUNTER — Ambulatory Visit (INDEPENDENT_AMBULATORY_CARE_PROVIDER_SITE_OTHER): Payer: Medicare Other | Admitting: Endocrinology

## 2017-05-20 ENCOUNTER — Encounter: Payer: Self-pay | Admitting: Endocrinology

## 2017-05-20 VITALS — BP 132/84 | HR 88 | Ht 64.0 in | Wt 176.0 lb

## 2017-05-20 DIAGNOSIS — Z794 Long term (current) use of insulin: Secondary | ICD-10-CM

## 2017-05-20 DIAGNOSIS — E1165 Type 2 diabetes mellitus with hyperglycemia: Secondary | ICD-10-CM | POA: Diagnosis not present

## 2017-05-20 NOTE — Progress Notes (Signed)
Patient ID: [name], female   DOB: 1951-03-27, 66 y.o.   MRN: [id number]   Reason for Appointment: Diabetes follow-up   History of Present Illness   Diagnosis: Type 2, date of diagnosis:1985.    PAST history: She has had difficult to control diabetes requiring large doses of insulin especially at meal times. Because of poor control she was switched from NovoLog to U-500 insulin in 11/2012. At that time her A1c was 9%  Initially seemed to have better blood sugars. With no clear response with Invokana this was stopped and she was started on Actos. Her insulin was adjusted with higher mealtime coverage at breakfast and lunch and lower amounts at supper. Lantus requirement appeared to be more in the morning and evening dose was eventually stopped. She was on Victoza and Invokana later in an attempt to improve her sugars but with only limited success.  Because of continued poor control with subcutaneous insulin and large insulin requirement she was switched to a Medtronic insulin pump which she started on 07/29/13 Prior to starting insulin pump her blood sugar at home was averaging 273 With the U 500 insulin in the pump her blood sugars were dramatically better with rare hypoglycemia  She was switched to NovoLog insulin in her pump instead of U-500 insulin in 3/15 because of  episode of severe overnight hypoglycemia  RECENT history:   Pump settings  Basal rate midnight = 0.75, 6 AM = 2.9, 11 AM = 4.2, 3 PM = 2.3 and 7 PM = 3.0, daily total 60 units  Carbohydrate coverage 1:6, correction 1: 35 and target 110-120, insulin duration 4 hours  Non-insulin hypoglycemic drugs: Victoza 1.2 mg daily, metformin ER 1500 mg daily, Invokana 300 mg daily  Her most recent A1c is 9, Previous range.8.2-9.1 Fructosamine is pending  Current blood sugar patterns and problems:   She was supposed to start checking her blood sugars more regularly but still is not doing so  She has been  repeatedly educated about doing boluses for every meal and snack and doing her boluses BEFORE eating but she is still not able to comply with this  She has some days where she has not done any boluses at all  She now said that she is trying to bolus after eating but she forgets to do this periodically in the last few days her blood sugars have been high later at night when she remembers to check them  Her BASAL rates were not changed on the last visit because of lack of adequate data and need for better compliance with boluses and monitoring  Although she is taking her Victoza, Invokana and metformin she still continued to have relatively high postprandial readings  However fasting readings are also mostly high  She was reporting that she was running out of insulin before the end of the month on the last visit but her insulin prescription has been changed accordingly  She is very consistent with exercising with walking couple of times a day and also going to the gym  However not losing weight  Recently has been trying to cut back on high-fat foods  HOME glucose readings for the last 2 weeks from pump download:  Mean values apply above for all meters except median for One Touch  PRE-MEAL Fasting Lunch Dinner Bedtime Overall  Glucose range: 135-204 67-200 70-183 201-230   Mean/median:     189     Meals: 2-3 meals per day, at 11 am, 3  pm and 7 pm Food preferences: She has oatmeal, toast frequently with egg for protein, usually has a sandwich and salad for lunch and vegetables for dinner with fish or lean meat for protein.   Will snack on fiber bars, some fruit, drinking some juice, low sugar  Wt Readings from Last 3 Encounters:  05/20/17 176 lb (79.8 kg)  04/01/17 174 lb (78.9 kg)  08/08/16 185 lb 9.6 oz (84.2 kg)   Physical activity: exercise: at Gym for 60 min, 3/7 days at 3 pm, am walks   Dietician visit: Most recent: 2012    Lab Results  Component Value Date   HGBA1C  9.0 (H) 02/20/2017   HGBA1C 8.4 (H) 09/07/2016   HGBA1C 9.1 (H) 06/14/2016   Lab Results  Component Value Date   MICROALBUR 6.9 (H) 09/07/2016   LDLCALC 120 (H) 09/07/2016   CREATININE 1.34 (H) 05/17/2017     Allergies as of 05/20/2017      Reactions   Codeine Hives, Itching, Nausea And Vomiting      Medication List       Accurate as of 05/20/17  1:48 PM. Always use your most recent med list.          albuterol 108 (90 Base) MCG/ACT inhaler Commonly known as:  PROAIR HFA 2 puffs every 4 hours as needed only  if your can't catch your breath   amLODipine 10 MG tablet Commonly known as:  NORVASC Take 10 mg by mouth every evening.   BAYER CONTOUR NEXT TEST test strip Generic drug:  glucose blood use AS INSTRUCTED TO CHECK BLOOD SUGARS 7 TIMES A DAY   BAYER MICROLET LANCETS lancets Use as instructed to check blood sugar 7 times per day dx code K09.38   BYSTOLIC 20 MG Tabs Generic drug:  Nebivolol HCl Take 20 mg by mouth daily.   docusate sodium 100 MG capsule Commonly known as:  COLACE Take 100 mg by mouth 2 (two) times daily as needed for mild constipation.   gabapentin 300 MG capsule Commonly known as:  NEURONTIN Take 1 capsule (300 mg total) by mouth 3 (three) times daily.   glucagon 1 MG injection Commonly known as:  GLUCAGON EMERGENCY Inject 1 mg into the vein once as needed (for low blood sugar).   hydrOXYzine 25 MG tablet Commonly known as:  ATARAX/VISTARIL Take 25 mg by mouth every 4 (four) hours as needed.   insulin lispro 100 UNIT/ML injection Commonly known as:  HUMALOG USE MAX 90 UNITS subcutaneously once daily WITH INSULIN PUMP   INVOKANA 300 MG Tabs tablet Generic drug:  canagliflozin take 1 tablet by mouth once daily BEFORE BREAKFAST   losartan 50 MG tablet Commonly known as:  COZAAR Take 1 tablet (50 mg total) by mouth daily.   metFORMIN 500 MG 24 hr tablet Commonly known as:  GLUCOPHAGE-XR Take 3 tablets (1,500 mg total) by mouth  daily with supper.   mometasone-formoterol 100-5 MCG/ACT Aero Commonly known as:  DULERA Take 2 puffs first thing in am and then another 2 puffs about 12 hours later.   oxybutynin 10 MG 24 hr tablet Commonly known as:  DITROPAN-XL Take 1 tablet by mouth daily. Take 1 tab by mouth daily   pantoprazole 40 MG tablet Commonly known as:  PROTONIX Take 30- 60 min before your first and last meals of the day   pravastatin 80 MG tablet Commonly known as:  PRAVACHOL Take 1 tablet (80 mg total) by mouth every morning.   spironolactone  50 MG tablet Commonly known as:  ALDACTONE take 1 tablet by mouth once daily   sucralfate 1 g tablet Commonly known as:  CARAFATE Take 1 g by mouth 4 (four) times daily.   VICTOZA 18 MG/3ML Sopn Generic drug:  liraglutide inject 1.2 milligram subcutaneously daily   Vitamin D (Ergocalciferol) 50000 units Caps capsule Commonly known as:  DRISDOL Take 1 capsule by mouth once a week. Take 1 cap by mouth once a week       Allergies:  Allergies  Allergen Reactions  . Codeine Hives, Itching and Nausea And Vomiting    Past Medical History:  Diagnosis Date  . Arthritis    L knee, hands, back   . Asthma   . Diabetes mellitus type 2, insulin dependent ([place])   . Fibromyalgia   . Full dentures   . GERD (gastroesophageal reflux disease)   . H/O hiatal hernia   . Headache    h/o migraines - was followed for a time with a wellness doctor  . History of esophageal dilatation   . History of rhabdomyolysis    03/ 2015  . Hyperlipidemia   . Hypertension   . Insulin pump in place    since 12/ 2014--  [facility]  . Osteoarthritis of left knee, primary localized 08/17/2014  . Renal insufficiency   . SUI (stress urinary incontinence, female)   . Wears glasses     Past Surgical History:  Procedure Laterality Date  . BLADDER SUSPENSION N/A 03/09/2014   Procedure: CYSTOSCOPY/SLING;  Surgeon: Reece Packer, MD;  [facility];   Service: Urology;  Laterality: N/A;  . CARDIAC CATHETERIZATION  07-20-2009   DR Shelva Majestic   MODERATE LVH/  NORMAL  LVEF/  NORMAL CORONARY AND RENAL ARTERIES  . CARPAL TUNNEL RELEASE Right 2000  . CHOLECYSTECTOMY  1990  . COLONOSCOPY WITH ESOPHAGOGASTRODUODENOSCOPY (EGD)  06-18-2002  . ESOPHAGOGASTRODUODENOSCOPY (EGD) WITH ESOPHAGEAL DILATION  06-12-2010  . EXCISION LEFT UPPER ARM MASS  01-29-2014  . EYE SURGERY  2010   laser on R eye  . KNEE ARTHROSCOPY Right 1989  &  2002  . NEGATIVE SLEEP STUDY  2012   PER PT  . PARTIAL KNEE ARTHROPLASTY Left 08/17/2014   Procedure: LEFT UNICOMPARTMENTAL KNEE;  Surgeon: Johnny Bridge, MD;  Location: [place];  Service: Orthopedics;  Laterality: Left;    Family History  Problem Relation Age of Onset  . Diabetes Mother   . Hypertension Mother   . Lung cancer Mother        smoked  . Diabetes Father   . Hypertension Father   . Diabetes Brother   . Heart failure Sister   . Diabetes Maternal Grandmother   . Cancer Maternal Grandmother   . Lung cancer Father        smoked    Social History:  reports that she has never smoked. She has never used smokeless tobacco. She reports that she does not drink alcohol or use drugs.  Review of Systems    Creatinine was as high as 1.6 previously Now slightly higher than before  Lab Results  Component Value Date   CREATININE 1.34 (H) 05/17/2017    HYPERTENSION:  This is better controlled, has also been followed by PCP. She is taking amlodipine, losartan  and Aldactone 50 mg    Hypokalemia: Potassium  has been stable   She is on Aldactone 50 mg   Lab Results  Component Value Date   CREATININE 1.34 (  H) 05/17/2017   BUN 9 05/17/2017   NA 140 05/17/2017   K 3.7 05/17/2017   CL 101 05/17/2017   CO2 31 05/17/2017     HYPERLIPIDEMIA:  Taking pravastatin 80 mg, followed by PCP   Lab Results  Component Value Date   CHOL 185 09/07/2016   HDL 49.10 09/07/2016   LDLCALC 120 (H) 09/07/2016    LDLDIRECT 152.8 10/09/2013   TRIG 78.0 09/07/2016   CHOLHDL 4 09/07/2016    NEUROPATHY: She is being treated with gabapentin for paresthesiae in feet with control, last foot exam in 8/18  Eye exam 11/18    Examination:   BP 132/84   Pulse 88   Ht 5\' 4"  (1.626 m)   Wt 176 lb (79.8 kg)   LMP  (LMP Unknown)   SpO2 98%   BMI 30.21 kg/m   Body mass index is 30.21 kg/m.    Repeat blood pressure standing up was 128/76 No ankle edema  Assesment/PLAN:   Diabetes type 2, uncontrolled  See history of present illness for detailed discussion of her current management, problems identified and blood sugar patterns  As discussed above her blood sugar control continues to be suboptimal This is mostly related to her inadequate bolusing However she appears to be insulin resistant even with taking 60 units of basal insulin and blood sugars are averaging about 190 at home when she checks them She is still not able to understand how to manage her boluses Most likely she is not bolusing for several meals even though she is only eating a smaller of carbohydrate and usually able to count carbohydrates   Recommended that she try to BOLUS before she starts eating every meal and snack with carbohydrate  She does need detailed review of her day-to-day management with nurse educator next month  She needs to check her blood sugar at least 3 times a day  Discussed that if she is still requiring large amounts of insulin without control and consider U-500 insulin again but she is afraid of hypoglycemia with this  For now will increase her VICTOZA to 1.8 mg to help postprandial control  She will increase her BASAL to 2.5 at 2 PM  Increase carbohydrate coverage 1:5 and sensitivity 1:30 instead of 35  Hypertension: Blood pressure is better with going back on amlodipine   RENAL dysfunction: Creatinine is slightly higher possibly from Lowering her blood pressure No orthostasis   LIPIDS: Will need  to recheck, most likely she needs to switch from pravastatin because the last LDL was over 100  Counseling time on subjects discussed in assessment and plan sections is over 50% of today's 25 minute visit     Patient Instructions  Do not eat without bolusing even if no sugar tested  Victoza 1.8mg     Lanaya Bennis 05/20/2017, 1:48 PM

## 2017-05-20 NOTE — Patient Instructions (Addendum)
Do not eat without bolusing even if no sugar tested  Victoza 1.8mg 

## 2017-05-21 LAB — FRUCTOSAMINE: Fructosamine: 388 umol/L — ABNORMAL HIGH (ref 0–285)

## 2017-06-26 ENCOUNTER — Ambulatory Visit: Payer: Medicare Other | Admitting: Nutrition

## 2017-07-03 ENCOUNTER — Ambulatory Visit: Payer: Medicare Other | Admitting: Nutrition

## 2017-07-05 DIAGNOSIS — E785 Hyperlipidemia, unspecified: Secondary | ICD-10-CM | POA: Diagnosis not present

## 2017-07-05 DIAGNOSIS — E1142 Type 2 diabetes mellitus with diabetic polyneuropathy: Secondary | ICD-10-CM | POA: Diagnosis not present

## 2017-07-05 DIAGNOSIS — I10 Essential (primary) hypertension: Secondary | ICD-10-CM | POA: Diagnosis not present

## 2017-07-12 ENCOUNTER — Telehealth: Payer: Self-pay

## 2017-07-12 NOTE — Telephone Encounter (Signed)
[name] from [facility] is calling to follow up on a prescription sent on 07/09/17 for pump and diabetic supplies please call and let her know if we have received this fax- number she can be [phone] option 2

## 2017-07-16 NOTE — Telephone Encounter (Signed)
Has anyone seen this paperwork?

## 2017-07-16 NOTE — Telephone Encounter (Signed)
I believe this has been signed and sent back up front

## 2017-07-17 ENCOUNTER — Other Ambulatory Visit: Payer: Medicare Other

## 2017-07-23 ENCOUNTER — Ambulatory Visit: Payer: Medicare Other | Admitting: Endocrinology

## 2017-07-24 NOTE — Telephone Encounter (Signed)
Can you please see if you have any information about this?

## 2017-07-29 DIAGNOSIS — E1165 Type 2 diabetes mellitus with hyperglycemia: Secondary | ICD-10-CM | POA: Diagnosis not present

## 2017-08-13 NOTE — Telephone Encounter (Signed)
[name], No paperwork has come across my desk for her.

## 2017-11-06 ENCOUNTER — Other Ambulatory Visit: Payer: Self-pay

## 2017-11-06 ENCOUNTER — Encounter (HOSPITAL_COMMUNITY): Payer: Self-pay | Admitting: Emergency Medicine

## 2017-11-06 ENCOUNTER — Emergency Department (HOSPITAL_COMMUNITY)
Admission: EM | Admit: 2017-11-06 | Discharge: 2017-11-06 | Disposition: A | Discharge status: Home / Self Care | Payer: Medicare Other | Attending: Emergency Medicine | Admitting: Emergency Medicine

## 2017-11-06 DIAGNOSIS — Z794 Long term (current) use of insulin: Secondary | ICD-10-CM | POA: Insufficient documentation

## 2017-11-06 DIAGNOSIS — Y929 Unspecified place or not applicable: Secondary | ICD-10-CM | POA: Diagnosis not present

## 2017-11-06 DIAGNOSIS — Y998 Other external cause status: Secondary | ICD-10-CM | POA: Insufficient documentation

## 2017-11-06 DIAGNOSIS — Z79899 Other long term (current) drug therapy: Secondary | ICD-10-CM | POA: Diagnosis not present

## 2017-11-06 DIAGNOSIS — I10 Essential (primary) hypertension: Secondary | ICD-10-CM | POA: Diagnosis not present

## 2017-11-06 DIAGNOSIS — J45909 Unspecified asthma, uncomplicated: Secondary | ICD-10-CM | POA: Insufficient documentation

## 2017-11-06 DIAGNOSIS — T18128A Food in esophagus causing other injury, initial encounter: Secondary | ICD-10-CM

## 2017-11-06 DIAGNOSIS — X58XXXA Exposure to other specified factors, initial encounter: Secondary | ICD-10-CM | POA: Insufficient documentation

## 2017-11-06 DIAGNOSIS — K222 Esophageal obstruction: Secondary | ICD-10-CM | POA: Diagnosis not present

## 2017-11-06 DIAGNOSIS — K219 Gastro-esophageal reflux disease without esophagitis: Secondary | ICD-10-CM | POA: Diagnosis not present

## 2017-11-06 DIAGNOSIS — E119 Type 2 diabetes mellitus without complications: Secondary | ICD-10-CM | POA: Insufficient documentation

## 2017-11-06 DIAGNOSIS — Z96652 Presence of left artificial knee joint: Secondary | ICD-10-CM | POA: Insufficient documentation

## 2017-11-06 DIAGNOSIS — T18120A Food in esophagus causing compression of trachea, initial encounter: Secondary | ICD-10-CM | POA: Diagnosis not present

## 2017-11-06 DIAGNOSIS — R1310 Dysphagia, unspecified: Secondary | ICD-10-CM | POA: Diagnosis not present

## 2017-11-06 DIAGNOSIS — F329 Major depressive disorder, single episode, unspecified: Secondary | ICD-10-CM | POA: Diagnosis not present

## 2017-11-06 DIAGNOSIS — Z9049 Acquired absence of other specified parts of digestive tract: Secondary | ICD-10-CM | POA: Insufficient documentation

## 2017-11-06 DIAGNOSIS — Y9389 Activity, other specified: Secondary | ICD-10-CM | POA: Diagnosis not present

## 2017-11-06 MED ORDER — GI COCKTAIL ~~LOC~~
30.0000 mL | Freq: Once | ORAL | Status: DC
Start: 2017-11-06 — End: 2017-11-06

## 2017-11-06 NOTE — ED Triage Notes (Signed)
Pt reports she was eating a spam sandwich tonight and she got some of it stuck in her throat.  This has happened three times tonight.  At this time she has managed to clear her throat.  She does have a hx of this and has had her esophagus stretches three times.  Pt is able to speak in complete sentences, no breathing distress noted at this time.

## 2017-11-06 NOTE — ED Provider Notes (Signed)
[facility] EMERGENCY DEPARTMENT Provider Note   CSN: [id number] Arrival date & time: 11/06/17  0343     History   Chief Complaint Chief Complaint  Patient presents with  . trouble swallowing    HPI [name] is a 67 y.o. female.  Pt presents to the ED today with spam stuck in her throat.  This happened around 1500 while at work.  She finished her shift and came to the ED.   She has a hx of this happening in the past.  Dr. Collene Mares has stretched her esophagus 3 times.  Last time was 1 year ago.  The pt has been waiting in the waiting room all night and thinks it has gone down.      Past Medical History:  Diagnosis Date  . Arthritis    L knee, hands, back   . Asthma   . Diabetes mellitus type 2, insulin dependent ([place])   . Fibromyalgia   . Full dentures   . GERD (gastroesophageal reflux disease)   . H/O hiatal hernia   . Headache    h/o migraines - was followed for a time with a wellness doctor  . History of esophageal dilatation   . History of rhabdomyolysis    03/ 2015  . Hyperlipidemia   . Hypertension   . Insulin pump in place    since 12/ 2014--  [employer]  . Osteoarthritis of left knee, primary localized 08/17/2014  . Renal insufficiency   . SUI (stress urinary incontinence, female)   . Wears glasses     Patient Active Problem List   Diagnosis Date Noted  . Cough variant asthma 12/01/2015  . Osteoarthritis of left knee, primary localized 08/17/2014  . Knee osteoarthritis 08/17/2014  . Insulin pump in place 12/21/2013  . Rhabdomyolysis 11/15/2013  . Elevated lactic acid level 11/15/2013  . Hypoglycemia 11/15/2013  . Other and unspecified hyperlipidemia 08/06/2013  . Type II diabetes mellitus, uncontrolled ([place]) 07/20/2013  . Varicose veins of lower extremities with other complications 48/54/6270  . Fibromyalgia   . GERD 05/18/2010  . URINARY TRACT INFECTION, MONILIAL 05/05/2010  . UTI 05/02/2010  . ABDOMINAL PAIN,  GENERALIZED 05/02/2010  . OBESITY 04/20/2010  . BURSITIS, LEFT SHOULDER 03/09/2010  . CANDIDIASIS OF VULVA AND VAGINA 01/10/2010  . Lipoma of arm s/p excision 01/29/2014 01/10/2010  . DEPRESSION 12/27/2009  . BACK PAIN WITH RADICULOPATHY 12/27/2009  . CYSTITIS, ACUTE 10/21/2009  . MICROSCOPIC HEMATURIA 10/18/2009  . KNEE PAIN, BILATERAL 10/18/2009  . NEUROPATHY 09/08/2009  . HYPERTENSION, BENIGN ESSENTIAL 09/08/2009  . ASTHMA 09/08/2009  . STRESS INCONTINENCE 09/08/2009  . CHEST PAIN 07/19/2009  . ESOPHAGEAL STRICTURE 08/27/2005  . Diabetes mellitus without complication ([place]) 35/00/9381    Past Surgical History:  Procedure Laterality Date  . BLADDER SUSPENSION N/A 03/09/2014   Procedure: CYSTOSCOPY/SLING;  Surgeon: Reece Packer, MD;  Location: [facility];  Service: Urology;  Laterality: N/A;  . CARDIAC CATHETERIZATION  07-20-2009   DR Shelva Majestic   MODERATE LVH/  NORMAL  LVEF/  NORMAL CORONARY AND RENAL ARTERIES  . CARPAL TUNNEL RELEASE Right 2000  . CHOLECYSTECTOMY  1990  . COLONOSCOPY WITH ESOPHAGOGASTRODUODENOSCOPY (EGD)  06-18-2002  . ESOPHAGOGASTRODUODENOSCOPY (EGD) WITH ESOPHAGEAL DILATION  06-12-2010  . EXCISION LEFT UPPER ARM MASS  01-29-2014  . EYE SURGERY  2010   laser on R eye  . KNEE ARTHROSCOPY Right 1989  &  2002  . NEGATIVE SLEEP STUDY  2012   PER  PT  . PARTIAL KNEE ARTHROPLASTY Left 08/17/2014   Procedure: LEFT UNICOMPARTMENTAL KNEE;  Surgeon: Johnny Bridge, MD;  Location: [facility];  Service: Orthopedics;  Laterality: Left;    OB History    Gravida Para Term Preterm AB Living   1 1 1     1    SAB TAB Ectopic Multiple Live Births           1       Home Medications    Prior to Admission medications   Medication Sig Start Date End Date Taking? Authorizing Provider  albuterol (PROAIR HFA) 108 (90 Base) MCG/ACT inhaler 2 puffs every 4 hours as needed only  if your can't catch your breath 01/20/16   Tanda Rockers, MD  amLODipine  (NORVASC) 10 MG tablet Take 10 mg by mouth every evening.     [provider]  BAYER CONTOUR NEXT TEST test strip use AS INSTRUCTED TO CHECK BLOOD SUGARS 7 TIMES A DAY 06/13/15   Elayne Snare, MD  BAYER MICROLET LANCETS lancets Use as instructed to check blood sugar 7 times per day dx code E11.65 06/13/15   Elayne Snare, MD  docusate sodium (COLACE) 100 MG capsule Take 100 mg by mouth 2 (two) times daily as needed for mild constipation.    [provider]  gabapentin (NEURONTIN) 300 MG capsule Take 1 capsule (300 mg total) by mouth 3 (three) times daily. 10/17/15   Elayne Snare, MD  glucagon (GLUCAGON EMERGENCY) 1 MG injection Inject 1 mg into the vein once as needed (for low blood sugar). 09/30/14   Elayne Snare, MD  hydrOXYzine (ATARAX/VISTARIL) 25 MG tablet Take 25 mg by mouth every 4 (four) hours as needed.  01/20/15   [provider]  insulin lispro (HUMALOG) 100 UNIT/ML injection USE MAX 90 UNITS subcutaneously once daily WITH INSULIN PUMP 04/01/17   Elayne Snare, MD  INVOKANA 300 MG TABS tablet take 1 tablet by mouth once daily BEFORE BREAKFAST 07/23/16   Elayne Snare, MD  losartan (COZAAR) 50 MG tablet Take 1 tablet (50 mg total) by mouth daily. 11/11/15   Elayne Snare, MD  metFORMIN (GLUCOPHAGE-XR) 500 MG 24 hr tablet Take 3 tablets (1,500 mg total) by mouth daily with supper. 09/01/15   Elayne Snare, MD  mometasone-formoterol [facility]) 100-5 MCG/ACT AERO Take 2 puffs first thing in am and then another 2 puffs about 12 hours later. 01/20/16   Tanda Rockers, MD  Nebivolol HCl (BYSTOLIC) 20 MG TABS Take 20 mg by mouth daily.    [provider]  oxybutynin (DITROPAN-XL) 10 MG 24 hr tablet Take 1 tablet by mouth daily. Take 1 tab by mouth daily 10/27/15   [provider]  pantoprazole (PROTONIX) 40 MG tablet Take 30- 60 min before your first and last meals of the day 12/01/15   Tanda Rockers, MD  pravastatin (PRAVACHOL) 80 MG tablet Take 1 tablet (80 mg total) by mouth  every morning. 01/21/15   Elayne Snare, MD  spironolactone (ALDACTONE) 50 MG tablet take 1 tablet by mouth once daily 05/15/16   Elayne Snare, MD  sucralfate (CARAFATE) 1 g tablet Take 1 g by mouth 4 (four) times daily.    [provider]  Lillia Dallas MG/3ML SOPN inject 1.2 milligram subcutaneously daily 07/23/16   Elayne Snare, MD  Vitamin D, Ergocalciferol, (DRISDOL) 50000 units CAPS capsule Take 1 capsule by mouth once a week. Take 1 cap by mouth once a week 10/17/15   [provider]    Family History Family History  Problem Relation Age of Onset  . Diabetes Mother   . Hypertension Mother   . Lung cancer Mother        smoked  . Diabetes Father   . Hypertension Father   . Lung cancer Father        smoked  . Diabetes Brother   . Heart failure Sister   . Diabetes Maternal Grandmother   . Cancer Maternal Grandmother     Social History Social History   Tobacco Use  . Smoking status: Never Smoker  . Smokeless tobacco: Never Used  Substance Use Topics  . Alcohol use: No  . Drug use: No     Allergies   Codeine   Review of Systems Review of Systems  Gastrointestinal:       Difficult swallowing  All other systems reviewed and are negative.    Physical Exam Updated Vital Signs BP (!) 171/72 (BP Location: Right Arm)   Pulse (!) 57   Temp 98 F (36.7 C) (Oral)   Resp 18   LMP  (LMP Unknown)   SpO2 100%   Physical Exam  Constitutional: She is oriented to person, place, and time. She appears well-developed and well-nourished.  HENT:  Head: Normocephalic and atraumatic.  Right Ear: External ear normal.  Left Ear: External ear normal.  Nose: Nose normal.  Mouth/Throat: Oropharynx is clear and moist.  Eyes: Conjunctivae and EOM are normal. Pupils are equal, round, and reactive to light.  Neck: Normal range of motion. Neck supple.  Cardiovascular: Normal rate, regular rhythm, normal heart sounds and intact distal pulses.  Pulmonary/Chest: Effort normal  and breath sounds normal.  Abdominal: Soft. Bowel sounds are normal.  Musculoskeletal: Normal range of motion.  Neurological: She is alert and oriented to person, place, and time.  Skin: Skin is warm. Capillary refill takes less than 2 seconds.  Psychiatric: She has a normal mood and affect. Her behavior is normal. Judgment and thought content normal.  Nursing note and vitals reviewed.    ED Treatments / Results  Labs (all labs ordered are listed, but only abnormal results are displayed) Labs Reviewed - No data to display  EKG  EKG Interpretation None       Radiology No results found.  Procedures Procedures (including critical care time)  Medications Ordered in ED Medications - No data to display   Initial Impression / Assessment and Plan / ED Course  I have reviewed the triage vital signs and the nursing notes.  Pertinent labs & imaging results that were available during my care of the patient were reviewed by me and considered in my medical decision making (see chart for details).   Pt able to drink fluids now without problems.   Pt d/w Dr. Collene Mares who can get pt onto the endoscopy table this afternoon.  She said for pt to be NPO.  Pt does have a ride to the endoscopy today.  Pt is good with this plan.   Final Clinical Impressions(s) / ED Diagnoses   Final diagnoses:  Esophageal obstruction due to food impaction    ED Discharge Orders    None       Isla Pence, MD 11/06/17 [id number]

## 2017-11-06 NOTE — Discharge Instructions (Signed)
Nothing to eat or drink until your endoscopy later today.

## 2017-11-06 NOTE — ED Notes (Signed)
Pt stable, ambulatory, and verbalizes understanding of d/c instructions.  

## 2017-11-07 DIAGNOSIS — E1165 Type 2 diabetes mellitus with hyperglycemia: Secondary | ICD-10-CM | POA: Diagnosis not present

## 2017-11-14 DIAGNOSIS — D519 Vitamin B12 deficiency anemia, unspecified: Secondary | ICD-10-CM | POA: Diagnosis not present

## 2017-11-14 DIAGNOSIS — I10 Essential (primary) hypertension: Secondary | ICD-10-CM | POA: Diagnosis not present

## 2017-11-14 DIAGNOSIS — E1142 Type 2 diabetes mellitus with diabetic polyneuropathy: Secondary | ICD-10-CM | POA: Diagnosis not present

## 2017-11-14 DIAGNOSIS — E785 Hyperlipidemia, unspecified: Secondary | ICD-10-CM | POA: Diagnosis not present

## 2017-11-14 DIAGNOSIS — R4702 Dysphasia: Secondary | ICD-10-CM | POA: Diagnosis not present

## 2017-12-03 DIAGNOSIS — K5730 Diverticulosis of large intestine without perforation or abscess without bleeding: Secondary | ICD-10-CM | POA: Diagnosis not present

## 2017-12-03 DIAGNOSIS — K210 Gastro-esophageal reflux disease with esophagitis: Secondary | ICD-10-CM | POA: Diagnosis not present

## 2017-12-03 DIAGNOSIS — K449 Diaphragmatic hernia without obstruction or gangrene: Secondary | ICD-10-CM | POA: Diagnosis not present

## 2017-12-08 DIAGNOSIS — E119 Type 2 diabetes mellitus without complications: Secondary | ICD-10-CM | POA: Diagnosis not present

## 2017-12-08 DIAGNOSIS — Z79899 Other long term (current) drug therapy: Secondary | ICD-10-CM | POA: Diagnosis not present

## 2017-12-08 DIAGNOSIS — K219 Gastro-esophageal reflux disease without esophagitis: Secondary | ICD-10-CM | POA: Diagnosis not present

## 2017-12-08 DIAGNOSIS — B354 Tinea corporis: Secondary | ICD-10-CM | POA: Diagnosis not present

## 2017-12-08 DIAGNOSIS — R21 Rash and other nonspecific skin eruption: Secondary | ICD-10-CM | POA: Diagnosis not present

## 2017-12-08 DIAGNOSIS — I10 Essential (primary) hypertension: Secondary | ICD-10-CM | POA: Diagnosis not present

## 2017-12-08 DIAGNOSIS — Z794 Long term (current) use of insulin: Secondary | ICD-10-CM | POA: Diagnosis not present

## 2017-12-26 ENCOUNTER — Observation Stay (HOSPITAL_COMMUNITY)
Admission: EM | Admit: 2017-12-26 | Discharge: 2017-12-28 | Disposition: A | Discharge status: Home / Self Care | Payer: Medicare Other | Attending: Family Medicine | Admitting: Family Medicine

## 2017-12-26 ENCOUNTER — Encounter (HOSPITAL_COMMUNITY): Payer: Self-pay | Admitting: Emergency Medicine

## 2017-12-26 ENCOUNTER — Other Ambulatory Visit: Payer: Self-pay

## 2017-12-26 DIAGNOSIS — I10 Essential (primary) hypertension: Secondary | ICD-10-CM | POA: Diagnosis present

## 2017-12-26 DIAGNOSIS — E1140 Type 2 diabetes mellitus with diabetic neuropathy, unspecified: Secondary | ICD-10-CM | POA: Diagnosis not present

## 2017-12-26 DIAGNOSIS — Z7951 Long term (current) use of inhaled steroids: Secondary | ICD-10-CM | POA: Insufficient documentation

## 2017-12-26 DIAGNOSIS — K219 Gastro-esophageal reflux disease without esophagitis: Secondary | ICD-10-CM | POA: Diagnosis not present

## 2017-12-26 DIAGNOSIS — Z9641 Presence of insulin pump (external) (internal): Secondary | ICD-10-CM

## 2017-12-26 DIAGNOSIS — R55 Syncope and collapse: Principal | ICD-10-CM | POA: Diagnosis present

## 2017-12-26 DIAGNOSIS — Z79899 Other long term (current) drug therapy: Secondary | ICD-10-CM | POA: Diagnosis not present

## 2017-12-26 DIAGNOSIS — E785 Hyperlipidemia, unspecified: Secondary | ICD-10-CM | POA: Diagnosis present

## 2017-12-26 DIAGNOSIS — S199XXA Unspecified injury of neck, initial encounter: Secondary | ICD-10-CM | POA: Diagnosis not present

## 2017-12-26 DIAGNOSIS — I130 Hypertensive heart and chronic kidney disease with heart failure and stage 1 through stage 4 chronic kidney disease, or unspecified chronic kidney disease: Secondary | ICD-10-CM | POA: Insufficient documentation

## 2017-12-26 DIAGNOSIS — J449 Chronic obstructive pulmonary disease, unspecified: Secondary | ICD-10-CM | POA: Diagnosis not present

## 2017-12-26 DIAGNOSIS — E1122 Type 2 diabetes mellitus with diabetic chronic kidney disease: Secondary | ICD-10-CM | POA: Diagnosis not present

## 2017-12-26 DIAGNOSIS — S0181XA Laceration without foreign body of other part of head, initial encounter: Secondary | ICD-10-CM | POA: Diagnosis not present

## 2017-12-26 DIAGNOSIS — N179 Acute kidney failure, unspecified: Secondary | ICD-10-CM | POA: Insufficient documentation

## 2017-12-26 DIAGNOSIS — S01511A Laceration without foreign body of lip, initial encounter: Secondary | ICD-10-CM | POA: Insufficient documentation

## 2017-12-26 DIAGNOSIS — Z794 Long term (current) use of insulin: Secondary | ICD-10-CM | POA: Diagnosis not present

## 2017-12-26 DIAGNOSIS — R404 Transient alteration of awareness: Secondary | ICD-10-CM | POA: Diagnosis not present

## 2017-12-26 DIAGNOSIS — S0990XA Unspecified injury of head, initial encounter: Secondary | ICD-10-CM

## 2017-12-26 DIAGNOSIS — W1830XA Fall on same level, unspecified, initial encounter: Secondary | ICD-10-CM | POA: Insufficient documentation

## 2017-12-26 DIAGNOSIS — N183 Chronic kidney disease, stage 3 unspecified: Secondary | ICD-10-CM | POA: Diagnosis present

## 2017-12-26 DIAGNOSIS — I129 Hypertensive chronic kidney disease with stage 1 through stage 4 chronic kidney disease, or unspecified chronic kidney disease: Secondary | ICD-10-CM | POA: Diagnosis not present

## 2017-12-26 DIAGNOSIS — S0003XA Contusion of scalp, initial encounter: Secondary | ICD-10-CM | POA: Diagnosis not present

## 2017-12-26 DIAGNOSIS — J45909 Unspecified asthma, uncomplicated: Secondary | ICD-10-CM | POA: Diagnosis present

## 2017-12-26 DIAGNOSIS — I509 Heart failure, unspecified: Secondary | ICD-10-CM | POA: Diagnosis not present

## 2017-12-26 DIAGNOSIS — E1129 Type 2 diabetes mellitus with other diabetic kidney complication: Secondary | ICD-10-CM | POA: Diagnosis present

## 2017-12-26 NOTE — ED Triage Notes (Signed)
was at work pushing a cart and had a syncopal episode fell face first on the ground, hematoma to left eye, bilateral jaw swelling and pain, and  lac to the lip and chin. Pt also co mid back pain with movement

## 2017-12-27 ENCOUNTER — Other Ambulatory Visit: Payer: Self-pay | Admitting: Cardiology

## 2017-12-27 ENCOUNTER — Observation Stay (HOSPITAL_BASED_OUTPATIENT_CLINIC_OR_DEPARTMENT_OTHER): Payer: Medicare Other

## 2017-12-27 ENCOUNTER — Emergency Department (HOSPITAL_COMMUNITY): Payer: Medicare Other

## 2017-12-27 ENCOUNTER — Observation Stay (HOSPITAL_COMMUNITY): Payer: Medicare Other

## 2017-12-27 DIAGNOSIS — E785 Hyperlipidemia, unspecified: Secondary | ICD-10-CM | POA: Diagnosis present

## 2017-12-27 DIAGNOSIS — R55 Syncope and collapse: Secondary | ICD-10-CM

## 2017-12-27 DIAGNOSIS — I361 Nonrheumatic tricuspid (valve) insufficiency: Secondary | ICD-10-CM

## 2017-12-27 DIAGNOSIS — S199XXA Unspecified injury of neck, initial encounter: Secondary | ICD-10-CM | POA: Diagnosis not present

## 2017-12-27 DIAGNOSIS — I10 Essential (primary) hypertension: Secondary | ICD-10-CM | POA: Diagnosis not present

## 2017-12-27 DIAGNOSIS — Z794 Long term (current) use of insulin: Secondary | ICD-10-CM

## 2017-12-27 DIAGNOSIS — N183 Chronic kidney disease, stage 3 unspecified: Secondary | ICD-10-CM | POA: Diagnosis present

## 2017-12-27 DIAGNOSIS — J4520 Mild intermittent asthma, uncomplicated: Secondary | ICD-10-CM

## 2017-12-27 DIAGNOSIS — N179 Acute kidney failure, unspecified: Secondary | ICD-10-CM | POA: Diagnosis not present

## 2017-12-27 DIAGNOSIS — E1122 Type 2 diabetes mellitus with diabetic chronic kidney disease: Secondary | ICD-10-CM

## 2017-12-27 DIAGNOSIS — E1129 Type 2 diabetes mellitus with other diabetic kidney complication: Secondary | ICD-10-CM | POA: Diagnosis present

## 2017-12-27 DIAGNOSIS — S01511A Laceration without foreign body of lip, initial encounter: Secondary | ICD-10-CM | POA: Diagnosis not present

## 2017-12-27 DIAGNOSIS — S0003XA Contusion of scalp, initial encounter: Secondary | ICD-10-CM | POA: Diagnosis not present

## 2017-12-27 DIAGNOSIS — Z9641 Presence of insulin pump (external) (internal): Secondary | ICD-10-CM

## 2017-12-27 HISTORY — DX: Syncope and collapse: R55

## 2017-12-27 LAB — I-STAT TROPONIN, ED: TROPONIN I, POC: 0.02 ng/mL (ref 0.00–0.08)

## 2017-12-27 LAB — BASIC METABOLIC PANEL
Anion gap: 11 (ref 5–15)
Anion gap: 11 (ref 5–15)
Anion gap: 12 (ref 5–15)
BUN: 19 mg/dL (ref 6–20)
BUN: 20 mg/dL (ref 6–20)
BUN: 20 mg/dL (ref 6–20)
CALCIUM: 8.7 mg/dL — AB (ref 8.9–10.3)
CHLORIDE: 100 mmol/L — AB (ref 101–111)
CHLORIDE: 100 mmol/L — AB (ref 101–111)
CHLORIDE: 96 mmol/L — AB (ref 101–111)
CO2: 25 mmol/L (ref 22–32)
CO2: 27 mmol/L (ref 22–32)
CO2: 30 mmol/L (ref 22–32)
CREATININE: 1.37 mg/dL — AB (ref 0.44–1.00)
CREATININE: 1.47 mg/dL — AB (ref 0.44–1.00)
CREATININE: 1.65 mg/dL — AB (ref 0.44–1.00)
Calcium: 8.7 mg/dL — ABNORMAL LOW (ref 8.9–10.3)
Calcium: 9.2 mg/dL (ref 8.9–10.3)
GFR calc Af Amer: 42 mL/min — ABNORMAL LOW (ref >60)
GFR calc Af Amer: 46 mL/min — ABNORMAL LOW (ref >60)
GFR calc non Af Amer: 32 mL/min — ABNORMAL LOW (ref >60)
GFR calc non Af Amer: 36 mL/min — ABNORMAL LOW (ref >60)
GFR calc non Af Amer: 40 mL/min — ABNORMAL LOW (ref >60)
GFR, EST AFRICAN AMERICAN: 37 mL/min — AB (ref >60)
Glucose, Bld: 157 mg/dL — ABNORMAL HIGH (ref 65–99)
Glucose, Bld: 216 mg/dL — ABNORMAL HIGH (ref 65–99)
Glucose, Bld: 239 mg/dL — ABNORMAL HIGH (ref 65–99)
Potassium: 3.9 mmol/L (ref 3.5–5.1)
Potassium: 4.2 mmol/L (ref 3.5–5.1)
Potassium: 6.2 mmol/L — ABNORMAL HIGH (ref 3.5–5.1)
SODIUM: 133 mmol/L — AB (ref 135–145)
SODIUM: 138 mmol/L (ref 135–145)
Sodium: 141 mmol/L (ref 135–145)

## 2017-12-27 LAB — CBC
HCT: 43.8 % (ref 36.0–46.0)
HEMATOCRIT: 43.2 % (ref 36.0–46.0)
Hemoglobin: 14.2 g/dL (ref 12.0–15.0)
Hemoglobin: 14.4 g/dL (ref 12.0–15.0)
MCH: 30 pg (ref 26.0–34.0)
MCH: 30.3 pg (ref 26.0–34.0)
MCHC: 32.9 g/dL (ref 30.0–36.0)
MCHC: 32.9 g/dL (ref 30.0–36.0)
MCV: 91.3 fL (ref 78.0–100.0)
MCV: 92.2 fL (ref 78.0–100.0)
PLATELETS: 222 10*3/uL (ref 150–400)
PLATELETS: 224 10*3/uL (ref 150–400)
RBC: 4.73 MIL/uL (ref 3.87–5.11)
RBC: 4.75 MIL/uL (ref 3.87–5.11)
RDW: 11.9 % (ref 11.5–15.5)
RDW: 12.3 % (ref 11.5–15.5)
WBC: 6.8 10*3/uL (ref 4.0–10.5)
WBC: 9.9 10*3/uL (ref 4.0–10.5)

## 2017-12-27 LAB — URINALYSIS, ROUTINE W REFLEX MICROSCOPIC
Bilirubin Urine: NEGATIVE
Glucose, UA: >=500 mg/dL — AB
Hgb urine dipstick: NEGATIVE
KETONES UR: NEGATIVE mg/dL
Nitrite: NEGATIVE
PH: 6 (ref 5.0–8.0)
PROTEIN: 30 mg/dL — AB
Specific Gravity, Urine: 1.017 (ref 1.005–1.030)

## 2017-12-27 LAB — RAPID URINE DRUG SCREEN, HOSP PERFORMED
Amphetamines: NOT DETECTED
Barbiturates: NOT DETECTED
Benzodiazepines: NOT DETECTED
Cocaine: NOT DETECTED
Opiates: NOT DETECTED
Tetrahydrocannabinol: NOT DETECTED

## 2017-12-27 LAB — ECHOCARDIOGRAM COMPLETE

## 2017-12-27 LAB — GLUCOSE, CAPILLARY: Glucose-Capillary: 227 mg/dL — ABNORMAL HIGH (ref 65–99)

## 2017-12-27 LAB — CBG MONITORING, ED
GLUCOSE-CAPILLARY: 129 mg/dL — AB (ref 65–99)
GLUCOSE-CAPILLARY: 249 mg/dL — AB (ref 65–99)
Glucose-Capillary: 270 mg/dL — ABNORMAL HIGH (ref 65–99)

## 2017-12-27 MED ORDER — INSULIN ASPART 100 UNIT/ML ~~LOC~~ SOLN
0.0000 [IU] | Freq: Every day | SUBCUTANEOUS | Status: DC
  Administered 2017-12-27: 2 [IU] via SUBCUTANEOUS

## 2017-12-27 MED ORDER — TETANUS-DIPHTH-ACELL PERTUSSIS 5-2.5-18.5 LF-MCG/0.5 IM SUSP
0.5000 mL | Freq: Once | INTRAMUSCULAR | Status: AC
  Administered 2017-12-27: 0.5 mL via INTRAMUSCULAR
  Filled 2017-12-27: qty 0.5

## 2017-12-27 MED ORDER — HYDRALAZINE HCL 20 MG/ML IJ SOLN
10.0000 mg | Freq: Once | INTRAMUSCULAR | Status: AC
  Administered 2017-12-27: 10 mg via INTRAVENOUS
  Filled 2017-12-27: qty 1

## 2017-12-27 MED ORDER — MOMETASONE FURO-FORMOTEROL FUM 100-5 MCG/ACT IN AERO
2.0000 | INHALATION_SPRAY | Freq: Two times a day (BID) | RESPIRATORY_TRACT | Status: DC
  Administered 2017-12-27 – 2017-12-28 (×3): 2 via RESPIRATORY_TRACT
  Filled 2017-12-27: qty 8.8

## 2017-12-27 MED ORDER — HYDRALAZINE HCL 20 MG/ML IJ SOLN: 5.0000 mg | INTRAMUSCULAR | Status: DC | PRN

## 2017-12-27 MED ORDER — LIDOCAINE-EPINEPHRINE (PF) 2 %-1:200000 IJ SOLN
20.0000 mL | Freq: Once | INTRAMUSCULAR | Status: AC
  Administered 2017-12-27: 20 mL
  Filled 2017-12-27: qty 20

## 2017-12-27 MED ORDER — PRAVASTATIN SODIUM 40 MG PO TABS
80.0000 mg | ORAL_TABLET | Freq: Every day | ORAL | Status: DC
  Administered 2017-12-27 – 2017-12-28 (×2): 80 mg via ORAL
  Filled 2017-12-27 (×2): qty 2

## 2017-12-27 MED ORDER — BACITRACIN ZINC 500 UNIT/GM EX OINT: TOPICAL_OINTMENT | Freq: Two times a day (BID) | CUTANEOUS | 0 refills | Status: DC

## 2017-12-27 MED ORDER — ACETAMINOPHEN 325 MG PO TABS
650.0000 mg | ORAL_TABLET | Freq: Four times a day (QID) | ORAL | Status: DC | PRN
  Administered 2017-12-27: 650 mg via ORAL
  Filled 2017-12-27: qty 2

## 2017-12-27 MED ORDER — HYDROXYZINE HCL 25 MG PO TABS
25.0000 mg | ORAL_TABLET | ORAL | Status: DC | PRN
Start: 2017-12-27 — End: 2017-12-28

## 2017-12-27 MED ORDER — BACITRACIN ZINC 500 UNIT/GM EX OINT
TOPICAL_OINTMENT | Freq: Two times a day (BID) | CUTANEOUS | Status: DC
  Administered 2017-12-27 – 2017-12-28 (×3): via TOPICAL
  Filled 2017-12-27: qty 28.35

## 2017-12-27 MED ORDER — ALBUTEROL SULFATE (2.5 MG/3ML) 0.083% IN NEBU
2.5000 mg | INHALATION_SOLUTION | RESPIRATORY_TRACT | Status: DC | PRN
Start: 2017-12-27 — End: 2017-12-28

## 2017-12-27 MED ORDER — SODIUM CHLORIDE 0.9% FLUSH
3.0000 mL | Freq: Two times a day (BID) | INTRAVENOUS | Status: DC
  Administered 2017-12-27 – 2017-12-28 (×4): 3 mL via INTRAVENOUS

## 2017-12-27 MED ORDER — NEBIVOLOL HCL 10 MG PO TABS
20.0000 mg | ORAL_TABLET | Freq: Every day | ORAL | Status: DC
  Administered 2017-12-27 – 2017-12-28 (×2): 20 mg via ORAL
  Filled 2017-12-27 (×3): qty 2

## 2017-12-27 MED ORDER — ZOLPIDEM TARTRATE 5 MG PO TABS: 5.0000 mg | ORAL_TABLET | Freq: Every evening | ORAL | Status: DC | PRN

## 2017-12-27 MED ORDER — PANTOPRAZOLE SODIUM 40 MG PO TBEC
40.0000 mg | DELAYED_RELEASE_TABLET | Freq: Every day | ORAL | Status: DC
  Administered 2017-12-27 – 2017-12-28 (×2): 40 mg via ORAL
  Filled 2017-12-27 (×2): qty 1

## 2017-12-27 MED ORDER — AMOXICILLIN 500 MG PO CAPS
500.0000 mg | ORAL_CAPSULE | Freq: Three times a day (TID) | ORAL | Status: DC
  Administered 2017-12-27 – 2017-12-28 (×4): 500 mg via ORAL
  Filled 2017-12-27 (×5): qty 1

## 2017-12-27 MED ORDER — TRAMADOL HCL 50 MG PO TABS
50.0000 mg | ORAL_TABLET | Freq: Once | ORAL | Status: AC
  Administered 2017-12-27: 50 mg via ORAL
  Filled 2017-12-27: qty 1

## 2017-12-27 MED ORDER — FENTANYL CITRATE (PF) 100 MCG/2ML IJ SOLN
50.0000 ug | Freq: Once | INTRAMUSCULAR | Status: AC
  Administered 2017-12-27: 50 ug via INTRAVENOUS
  Filled 2017-12-27: qty 2

## 2017-12-27 MED ORDER — GABAPENTIN 300 MG PO CAPS
300.0000 mg | ORAL_CAPSULE | Freq: Three times a day (TID) | ORAL | Status: DC
  Administered 2017-12-27 – 2017-12-28 (×4): 300 mg via ORAL
  Filled 2017-12-27 (×4): qty 1

## 2017-12-27 MED ORDER — DOCUSATE SODIUM 100 MG PO CAPS: 100.0000 mg | ORAL_CAPSULE | Freq: Two times a day (BID) | ORAL | Status: DC | PRN

## 2017-12-27 MED ORDER — INSULIN ASPART 100 UNIT/ML ~~LOC~~ SOLN
0.0000 [IU] | Freq: Three times a day (TID) | SUBCUTANEOUS | Status: DC
  Administered 2017-12-27: 3 [IU] via SUBCUTANEOUS
  Administered 2017-12-27: 5 [IU] via SUBCUTANEOUS
  Administered 2017-12-27: 3 [IU] via SUBCUTANEOUS
  Administered 2017-12-28: 2 [IU] via SUBCUTANEOUS
  Administered 2017-12-28: 5 [IU] via SUBCUTANEOUS
  Filled 2017-12-27 (×2): qty 1

## 2017-12-27 MED ORDER — OXYBUTYNIN CHLORIDE ER 10 MG PO TB24
10.0000 mg | ORAL_TABLET | Freq: Every day | ORAL | Status: DC
  Administered 2017-12-27: 10 mg via ORAL
  Filled 2017-12-27: qty 1

## 2017-12-27 MED ORDER — SODIUM CHLORIDE 0.9 % IV SOLN
INTRAVENOUS | Status: DC
  Administered 2017-12-27 – 2017-12-28 (×4): via INTRAVENOUS

## 2017-12-27 MED ORDER — AMOXICILLIN 500 MG PO CAPS
500.0000 mg | ORAL_CAPSULE | Freq: Three times a day (TID) | ORAL | 0 refills | Status: DC
Start: 2017-12-27 — End: 2018-06-09

## 2017-12-27 MED ORDER — FENTANYL CITRATE (PF) 100 MCG/2ML IJ SOLN
75.0000 ug | Freq: Once | INTRAMUSCULAR | Status: AC
  Administered 2017-12-27: 75 ug via INTRAVENOUS
  Filled 2017-12-27: qty 2

## 2017-12-27 MED ORDER — AMLODIPINE BESYLATE 10 MG PO TABS
10.0000 mg | ORAL_TABLET | Freq: Every evening | ORAL | Status: DC
  Administered 2017-12-27: 10 mg via ORAL

## 2017-12-27 MED ORDER — SUCRALFATE 1 G PO TABS
1.0000 g | ORAL_TABLET | Freq: Four times a day (QID) | ORAL | Status: DC
  Administered 2017-12-27 – 2017-12-28 (×5): 1 g via ORAL
  Filled 2017-12-27 (×4): qty 1

## 2017-12-27 MED ORDER — AMOXICILLIN 500 MG PO CAPS: 500.0000 mg | ORAL_CAPSULE | Freq: Once | ORAL | Status: DC

## 2017-12-27 MED ORDER — ONDANSETRON HCL 4 MG/2ML IJ SOLN
4.0000 mg | Freq: Three times a day (TID) | INTRAMUSCULAR | Status: DC | PRN
  Administered 2017-12-28: 4 mg via INTRAVENOUS
  Filled 2017-12-27: qty 2

## 2017-12-27 NOTE — Progress Notes (Signed)
Pt was seen for evaluation and noted her ability to shift two wheeled carts with some heavy support, but recommended to her a rolling walker.  Pt is fine with this and understands the reasoning.  Her plan is to go home with HHPT and follow up even with outpatient if this is warranted.  Focus acutely on strengthening and standing balance control of the walker and cues for safety.    12/27/17 1700  PT Visit Information  Last PT Received On 12/27/17  Assistance Needed +1  History of Present Illness 67 yo female with onset of syncope at work, has fallen and struck her chin and lower lip. Screening for UTI without symptoms, and head/neck CT is negative. PMHx:  asthma, migraine, DM, HTN, CKD 3,    Precautions  Precautions Fall  Precaution Comments monitor sats and pulses  Restrictions  Weight Bearing Restrictions No  Home Living  Family/patient expects to be discharged to: Private residence  Living Arrangements Spouse/significant other  Available Help at Discharge Family;Available 24 hours/day  Type of Home Apartment  Home Access Stairs to enter  Entrance Stairs-Number of Steps 15  Entrance Stairs-Rails Right;Left;Can reach both  Home Layout One level  Tax adviser - 2 wheels  Additional Comments has been working doing physical tasks with no issues previously  Prior Function  Level of Independence Independent  Communication  Communication No difficulties  Pain Assessment  Pain Assessment Faces  Faces Pain Scale 4  Pain Location facial injury  Pain Descriptors / Indicators Tender  Pain Intervention(s) Limited activity within patient's tolerance;Monitored during session;Premedicated before session;Repositioned  Cognition  Arousal/Alertness Awake/alert  Behavior During Therapy WFL for tasks assessed/performed  Overall Cognitive Status Within Functional Limits for tasks assessed  Upper Extremity Assessment  Upper Extremity Assessment Overall WFL for  tasks assessed  Lower Extremity Assessment  Lower Extremity Assessment Overall WFL for tasks assessed  Cervical / Trunk Assessment  Cervical / Trunk Assessment Normal  Bed Mobility  Overal bed mobility Modified Independent  General bed mobility comments using bedrail for support to get into bed and HOB elevated  Transfers  Overall transfer level Needs assistance  Equipment used 1 person hand held assist (IV pole and cart for vitals)  Transfers Sit to/from Stand  Sit to Stand Min guard  General transfer comment using hand rails and cues for safety  Ambulation/Gait  Ambulation/Gait assistance Min guard;Min assist  Ambulation Distance (Feet) 20 Feet  Assistive device 1 person hand held assist (IV pole and vitals cart)  Gait Pattern/deviations Step-through pattern;Wide base of support;Trunk flexed;Shuffle  General Gait Details pt is maneuvering carts and using for support to get to bed  Gait velocity reduced  Gait velocity interpretation <1.8 ft/sec, indicate of risk for recurrent falls  Balance  Overall balance assessment Needs assistance;History of Falls  Sitting-balance support Feet supported;Bilateral upper extremity supported  Sitting balance-Leahy Scale Fair  Standing balance support Bilateral upper extremity supported;During functional activity  Standing balance-Leahy Scale Poor  Exercises  Exercises Other exercises  Other Exercises  Other Exercises 4- hip adduction and hamstrings  PT - End of Session  Equipment Utilized During Treatment Gait belt  Activity Tolerance Patient limited by fatigue;Treatment limited secondary to medical complications (Comment)  Patient left in bed;with call bell/phone within reach;with bed alarm set;with nursing/sitter in room  Nurse Communication Mobility status  PT Assessment  PT Recommendation/Assessment Patient needs continued PT services  PT Visit Diagnosis Unsteadiness on feet (R26.81);Muscle weakness (generalized) (M62.81);History of  falling (Z91.81);Difficulty in walking, not elsewhere classified (R26.2);Pain  Pain - Right/Left Left  Pain - part of body  (chin and lip)  PT Problem List Decreased strength;Decreased range of motion;Decreased activity tolerance;Decreased balance;Decreased mobility;Decreased coordination;Decreased cognition;Decreased knowledge of use of DME;Decreased safety awareness;Decreased knowledge of precautions;Cardiopulmonary status limiting activity;Decreased skin integrity;Pain  Barriers to Discharge Inaccessible home environment  Barriers to Discharge Comments 15 steps to enter house  PT Plan  PT Frequency (ACUTE ONLY) Min 3X/week  PT Treatment/Interventions (ACUTE ONLY) DME instruction;Gait training;Stair training;Functional mobility training;Therapeutic activities;Therapeutic exercise;Balance training;Neuromuscular re-education;Cognitive remediation;Wheelchair mobility training  AM-PAC PT "6 Clicks" Daily Activity Outcome Measure  Difficulty turning over in bed (including adjusting bedclothes, sheets and blankets)? 1  Difficulty moving from lying on back to sitting on the side of the bed?  3  Difficulty sitting down on and standing up from a chair with arms (e.g., wheelchair, bedside commode, etc,.)? 3  Help needed moving to and from a bed to chair (including a wheelchair)? 3  Help needed walking in hospital room? 3  Help needed climbing 3-5 steps with a railing?  2  6 Click Score 15  Mobility G Code  CK  PT Recommendation  Follow Up Recommendations Home health PT;Supervision/Assistance - 24 hour  PT equipment Rolling walker with 5" wheels  Individuals Consulted  Consulted and Agree with Results and Recommendations Patient  Acute Rehab PT Goals  Patient Stated Goal to get up the stairs and get home  PT Goal Formulation With patient  Time For Goal Achievement 01/10/18  Potential to Achieve Goals Good  PT Time Calculation  PT Start Time (ACUTE ONLY) 1545  PT Stop Time (ACUTE ONLY) 1559  PT  Time Calculation (min) (ACUTE ONLY) 14 min  PT G-Codes **NOT FOR INPATIENT CLASS**  Functional Assessment Tool Used AM-PAC 6 Clicks Basic Mobility  PT General Charges  $$ ACUTE PT VISIT 1 Visit  PT Evaluation  $PT Eval Moderate Complexity 1 Mod  Written Expression  Dominant Hand Right    Mee Hives, PT MS Acute Rehab Dept. Number: [place] [facility] [place]

## 2017-12-27 NOTE — ED Notes (Signed)
Pt notified of the need of a urine sample. Urine cup at bedside.

## 2017-12-27 NOTE — ED Notes (Signed)
Helped pt to RR to obtain UA Sample. Pt was unable to void. Will try back.

## 2017-12-27 NOTE — Progress Notes (Signed)
Inpatient Diabetes Program Recommendations  AACE/ADA: New Consensus Statement on Inpatient Glycemic Control (2015)  Target Ranges:  Prepandial:   less than 140 mg/dL      Peak postprandial:   less than 180 mg/dL (1-2 hours)      Critically ill patients:  140 - 180 mg/dL   Lab Results  Component Value Date   GLUCAP 270 (H) 12/27/2017   HGBA1C 9.0 (H) 02/20/2017   Review of Glycemic Control  Diabetes history: DM 2 Outpatient Diabetes medications: Insulin pump with Humalog, Victoza 1.2 mg Daily, Invokana 300 mg Daily, Metformin 1,500 mg Daily at supper  Current orders for Inpatient glycemic control: Novolog Sensitive Correction 0-9 units tid  Inpatient Diabetes Program Recommendations:    Patient uses insulin pump at home. Glucose trends increasing here in the 200's. Please consider starting weight based basal insulin while here. Patient can restart insulin pump at home 24 hours after Lantus injection administered. Consider Lantus 10 units Daily to start.  Thanks,  Tama Headings RN, MSN, BC-ADM, [facility] Inpatient Diabetes Coordinator Team Pager [phone] (8a-5p)

## 2017-12-27 NOTE — ED Provider Notes (Signed)
[facility] EMERGENCY DEPARTMENT Provider Note   CSN: [id number] Arrival date & time: 12/26/17  2332     History   Chief Complaint Chief Complaint  Patient presents with  . Loss of Consciousness    was at work pushing a cart and had a syncopal episode fell face first on the ground, hematoma to left eye, bilateral jaw swelling and pain, and  lac to the lip and chin.     HPI [name] is a 67 y.o. female.  Patient with past medical history remarkable for diabetes, hypertension, hyperlipidemia, and asthma presents to the emergency department with chief complaint of syncope.  She was at work today and was "pushing a cart," when she suddenly passed out.  She denies feeling lightheaded or dizzy prior to passing out.  She hit her head and face and jaw when she passed out.  She complains of pain in these locations.  She did sustain several lacerations on her chin and lower lip.  She complains of pain with jaw movement.  She denies any chest pain or shortness of breath.  Denies any abdominal pain.  Denies any pain in her extremities.  She has not taken anything for her symptoms.  Last tetanus shot is unknown.  The history is provided by the patient. No language interpreter was used.    Past Medical History:  Diagnosis Date  . Arthritis    L knee, hands, back   . Asthma   . Diabetes mellitus type 2, insulin dependent ([place])   . Fibromyalgia   . Full dentures   . GERD (gastroesophageal reflux disease)   . H/O hiatal hernia   . Headache    h/o migraines - was followed for a time with a wellness doctor  . History of esophageal dilatation   . History of rhabdomyolysis    03/ 2015  . Hyperlipidemia   . Hypertension   . Insulin pump in place    since 12/ 2014--  [employer]  . Osteoarthritis of left knee, primary localized 08/17/2014  . Renal insufficiency   . SUI (stress urinary incontinence, female)   . Wears glasses     Patient Active Problem List   Diagnosis Date Noted  . Cough variant asthma 12/01/2015  . Osteoarthritis of left knee, primary localized 08/17/2014  . Knee osteoarthritis 08/17/2014  . Insulin pump in place 12/21/2013  . Rhabdomyolysis 11/15/2013  . Elevated lactic acid level 11/15/2013  . Hypoglycemia 11/15/2013  . Other and unspecified hyperlipidemia 08/06/2013  . Type II diabetes mellitus, uncontrolled ([place]) 07/20/2013  . Varicose veins of lower extremities with other complications 95/04/3266  . Fibromyalgia   . GERD 05/18/2010  . URINARY TRACT INFECTION, MONILIAL 05/05/2010  . UTI 05/02/2010  . ABDOMINAL PAIN, GENERALIZED 05/02/2010  . OBESITY 04/20/2010  . BURSITIS, LEFT SHOULDER 03/09/2010  . CANDIDIASIS OF VULVA AND VAGINA 01/10/2010  . Lipoma of arm s/p excision 01/29/2014 01/10/2010  . DEPRESSION 12/27/2009  . BACK PAIN WITH RADICULOPATHY 12/27/2009  . CYSTITIS, ACUTE 10/21/2009  . MICROSCOPIC HEMATURIA 10/18/2009  . KNEE PAIN, BILATERAL 10/18/2009  . NEUROPATHY 09/08/2009  . HYPERTENSION, BENIGN ESSENTIAL 09/08/2009  . ASTHMA 09/08/2009  . STRESS INCONTINENCE 09/08/2009  . CHEST PAIN 07/19/2009  . ESOPHAGEAL STRICTURE 08/27/2005  . Diabetes mellitus without complication ([place]) 12/45/8099    Past Surgical History:  Procedure Laterality Date  . BLADDER SUSPENSION N/A 03/09/2014   Procedure: CYSTOSCOPY/SLING;  Surgeon: Reece Packer, MD;  Location: [facility];  Service: Urology;  Laterality: N/A;  . CARDIAC CATHETERIZATION  07-20-2009   DR Shelva Majestic   MODERATE LVH/  NORMAL  LVEF/  NORMAL CORONARY AND RENAL ARTERIES  . CARPAL TUNNEL RELEASE Right 2000  . CHOLECYSTECTOMY  1990  . COLONOSCOPY WITH ESOPHAGOGASTRODUODENOSCOPY (EGD)  06-18-2002  . ESOPHAGOGASTRODUODENOSCOPY (EGD) WITH ESOPHAGEAL DILATION  06-12-2010  . EXCISION LEFT UPPER ARM MASS  01-29-2014  . EYE SURGERY  2010   laser on R eye  . KNEE ARTHROSCOPY Right 1989  &  2002  . NEGATIVE SLEEP STUDY  2012   PER PT  .  PARTIAL KNEE ARTHROPLASTY Left 08/17/2014   Procedure: LEFT UNICOMPARTMENTAL KNEE;  Surgeon: Johnny Bridge, MD;  Location: [facility];  Service: Orthopedics;  Laterality: Left;     OB History    Gravida  1   Para  1   Term  1   Preterm      AB      Living  1     SAB      TAB      Ectopic      Multiple      Live Births  1            Home Medications    Prior to Admission medications   Medication Sig Start Date End Date Taking? Authorizing Provider  albuterol (PROAIR HFA) 108 (90 Base) MCG/ACT inhaler 2 puffs every 4 hours as needed only  if your can't catch your breath 01/20/16   Tanda Rockers, MD  amLODipine (NORVASC) 10 MG tablet Take 10 mg by mouth every evening.     [provider]  BAYER CONTOUR NEXT TEST test strip use AS INSTRUCTED TO CHECK BLOOD SUGARS 7 TIMES A DAY 06/13/15   Elayne Snare, MD  BAYER MICROLET LANCETS lancets Use as instructed to check blood sugar 7 times per day dx code E11.65 06/13/15   Elayne Snare, MD  docusate sodium (COLACE) 100 MG capsule Take 100 mg by mouth 2 (two) times daily as needed for mild constipation.    [provider]  gabapentin (NEURONTIN) 300 MG capsule Take 1 capsule (300 mg total) by mouth 3 (three) times daily. 10/17/15   Elayne Snare, MD  glucagon (GLUCAGON EMERGENCY) 1 MG injection Inject 1 mg into the vein once as needed (for low blood sugar). 09/30/14   Elayne Snare, MD  hydrOXYzine (ATARAX/VISTARIL) 25 MG tablet Take 25 mg by mouth every 4 (four) hours as needed.  01/20/15   [provider]  insulin lispro (HUMALOG) 100 UNIT/ML injection USE MAX 90 UNITS subcutaneously once daily WITH INSULIN PUMP 04/01/17   Elayne Snare, MD  INVOKANA 300 MG TABS tablet take 1 tablet by mouth once daily BEFORE BREAKFAST 07/23/16   Elayne Snare, MD  losartan (COZAAR) 50 MG tablet Take 1 tablet (50 mg total) by mouth daily. 11/11/15   Elayne Snare, MD  metFORMIN (GLUCOPHAGE-XR) 500 MG 24 hr tablet Take 3 tablets (1,500 mg  total) by mouth daily with supper. 09/01/15   Elayne Snare, MD  mometasone-formoterol [facility]) 100-5 MCG/ACT AERO Take 2 puffs first thing in am and then another 2 puffs about 12 hours later. 01/20/16   Tanda Rockers, MD  Nebivolol HCl (BYSTOLIC) 20 MG TABS Take 20 mg by mouth daily.    [provider]  oxybutynin (DITROPAN-XL) 10 MG 24 hr tablet Take 1 tablet by mouth daily. Take 1 tab by mouth daily 10/27/15   [provider]  pantoprazole (PROTONIX) 40 MG tablet Take 30- 60 min before your first and last meals of the day 12/01/15   Tanda Rockers, MD  pravastatin (PRAVACHOL) 80 MG tablet Take 1 tablet (80 mg total) by mouth every morning. 01/21/15   Elayne Snare, MD  spironolactone (ALDACTONE) 50 MG tablet take 1 tablet by mouth once daily 05/15/16   Elayne Snare, MD  sucralfate (CARAFATE) 1 g tablet Take 1 g by mouth 4 (four) times daily.    [provider]  Lillia Dallas MG/3ML SOPN inject 1.2 milligram subcutaneously daily 07/23/16   Elayne Snare, MD  Vitamin D, Ergocalciferol, (DRISDOL) 50000 units CAPS capsule Take 1 capsule by mouth once a week. Take 1 cap by mouth once a week 10/17/15   [provider]    Family History Family History  Problem Relation Age of Onset  . Diabetes Mother   . Hypertension Mother   . Lung cancer Mother        smoked  . Diabetes Father   . Hypertension Father   . Lung cancer Father        smoked  . Diabetes Brother   . Heart failure Sister   . Diabetes Maternal Grandmother   . Cancer Maternal Grandmother     Social History Social History   Tobacco Use  . Smoking status: Never Smoker  . Smokeless tobacco: Never Used  Substance Use Topics  . Alcohol use: No  . Drug use: No     Allergies   Codeine   Review of Systems Review of Systems  All other systems reviewed and are negative.    Physical Exam Updated Vital Signs BP (!) 178/75   Pulse 62   Temp 98.4 F (36.9 C) (Oral)   Resp 13   LMP  (LMP Unknown)    SpO2 100%   Physical Exam  Constitutional: She is oriented to person, place, and time. She appears well-developed and well-nourished.  HENT:  Head: Normocephalic and atraumatic.  1 cm through and through laceration to the lower lip, 1 cm shallow laceration to the chin, dentition intact (dentures), pain with jaw movement, large contusion over her left orbit  Eyes: Pupils are equal, round, and reactive to light. Conjunctivae and EOM are normal.  Neck: Normal range of motion. Neck supple.  Cardiovascular: Normal rate and regular rhythm. Exam reveals no gallop and no friction rub.  No murmur heard. Pulmonary/Chest: Effort normal and breath sounds normal. No respiratory distress. She has no wheezes. She has no rales. She exhibits no tenderness.  Abdominal: Soft. Bowel sounds are normal. She exhibits no distension and no mass. There is no tenderness. There is no rebound and no guarding.  Musculoskeletal: Normal range of motion. She exhibits no edema or tenderness.  Moves all extremities  Neurological: She is alert and oriented to person, place, and time.  Sensation and strength intact throughout   Skin: Skin is warm and dry.  Psychiatric: She has a normal mood and affect. Her behavior is normal. Judgment and thought content normal.  Nursing note and vitals reviewed.    ED Treatments / Results  Labs (all labs ordered are listed, but only abnormal results are displayed) Labs Reviewed  CBG MONITORING, ED - Abnormal; Notable for the following components:      Result Value   Glucose-Capillary 129 (*)    All other components within normal limits  BASIC METABOLIC PANEL  CBC  URINALYSIS, ROUTINE W REFLEX MICROSCOPIC  I-STAT TROPONIN, ED  EKG None  Radiology No results found.  Procedures Procedures (including critical care time) LACERATION REPAIR Performed by: Montine Circle Authorized by: Montine Circle Consent: Verbal consent obtained. Risks and benefits: risks, benefits and  alternatives were discussed Consent given by: patient Patient identity confirmed: provided demographic data Prepped and Draped in normal sterile fashion Wound explored  Laceration Location: chin  Laceration Length: 1cm  No Foreign Bodies seen or palpated  Anesthesia: local infiltration  Local anesthetic: lidocaine 1% with epinephrine  Anesthetic total: 2 ml  Irrigation method: syringe Amount of cleaning: standard  Skin closure: 5-0 vicryl rapide  Number of sutures: 2  Technique: interrupted  Patient tolerance: Patient tolerated the procedure well with no immediate complications. LACERATION REPAIR Performed by: Montine Circle Authorized by: Montine Circle Consent: Verbal consent obtained. Risks and benefits: risks, benefits and alternatives were discussed Consent given by: patient Patient identity confirmed: provided demographic data Prepped and Draped in normal sterile fashion Wound explored  Laceration Location: lower lip  Laceration Length: 2cm  No Foreign Bodies seen or palpated  Anesthesia: local infiltration  Local anesthetic: lidocaine 1% with epinephrine  Anesthetic total: 1 ml  Irrigation method: syringe Amount of cleaning: standard  Skin closure: 5-0 vicryl rapide  Number of sutures: 2  Technique: interrupted  Patient tolerance: Patient tolerated the procedure well with no immediate complications. LACERATION REPAIR Performed by: Montine Circle Authorized by: Montine Circle Consent: Verbal consent obtained. Risks and benefits: risks, benefits and alternatives were discussed Consent given by: patient Patient identity confirmed: provided demographic data Prepped and Draped in normal sterile fashion Wound explored  Laceration Location: lower lip inner mucosa  Laceration Length: 2cm  No Foreign Bodies seen or palpated  Anesthesia: local infiltration  Local anesthetic: lidocaine 1% with epinephrine  Anesthetic total: 0  ml  Irrigation method: syringe Amount of cleaning: standard  Skin closure: 5-0 vicryl rapide  Number of sutures: 2  Technique: interrupted  Patient tolerance: Patient tolerated the procedure well with no immediate complications.  Medications Ordered in ED Medications  lidocaine-EPINEPHrine (XYLOCAINE W/EPI) 2 %-1:200000 (PF) injection 20 mL (20 mLs Infiltration Given 12/27/17 0023)  Tdap (BOOSTRIX) injection 0.5 mL (0.5 mLs Intramuscular Given 12/27/17 0024)  fentaNYL (SUBLIMAZE) injection 75 mcg (75 mcg Intravenous Given 12/27/17 0023)     Initial Impression / Assessment and Plan / ED Course  I have reviewed the triage vital signs and the nursing notes.  Pertinent labs & imaging results that were available during my care of the patient were reviewed by me and considered in my medical decision making (see chart for details).     Patient with syncopal episode today.  She injured her head and face.  Will check imaging of the dislocations.  Her syncopal episode is concerning and that she did not have any lightheadedness or dizziness prior to passing out.    CT imaging is negative for fracture or intracranial process.  Laceration was repaired in the emergency department with absorbable suture.  Patient will need admission given syncope without prior symptoms.    Discussed the case with Dr. Blaine Hamper, who will admit.  Amoxicillin for through and through lip laceration.      Final Clinical Impressions(s) / ED Diagnoses   Final diagnoses:  Syncope, unspecified syncope type    ED Discharge Orders    None       Montine Circle, PA-C 12/27/17 [id number]

## 2017-12-27 NOTE — Progress Notes (Signed)
Called 5w to give report. RN unable to take report. Awaiting call back

## 2017-12-27 NOTE — Progress Notes (Signed)
Preliminary note--Bilateral carotid duplex study completed. Antegrade flow of vertebral artery bilaterally.  No hemodynamic significant stenosis according to the velocity. No obvious plaque seen bilaterally.   [name] (RDMS RVT) 12/27/17 11:18 AM

## 2017-12-27 NOTE — ED Provider Notes (Signed)
Medical screening examination/treatment/procedure(s) were conducted as a shared visit with non-physician practitioner(s) and myself.  I personally evaluated the patient during the encounter.  EKG Interpretation  Date/Time:  Thursday Dec 26 2017 23:45:14 EDT Ventricular Rate:  60 PR Interval:    QRS Duration: 95 QT Interval:  461 QTC Calculation: 461 R Axis:   68 Text Interpretation:  Sinus rhythm Probable left ventricular hypertrophy Anterior Q waves, possibly due to LVH Borderline T abnormalities, inferior leads Baseline wander in lead(s) V3 No significant change since last tracing Confirmed by Pryor Curia [phone]) on 12/27/2017 3:42:22 AM    Patient is a 67 year old female with history of hypertension who presents to the emergency department after syncopal event.  States she was walking in a store and she suddenly passed out.  No preceding symptoms.  Has multiple facial lacerations but CT scan showed no acute abnormality.  EKG shows no arrhythmia or ischemic change.  Patient's labs are reassuring.  Unclear for the cause of her syncopal event and recommended admission for observation with cardiac monitoring and possible echocardiogram to rule out any cardiac cause of her syncopal event today.  No seizure-like activity or postictal state.   Bijal Siglin, Delice Bison, DO 12/27/17 [phone]

## 2017-12-27 NOTE — Progress Notes (Signed)
Report given to 5W RN

## 2017-12-27 NOTE — Discharge Summary (Signed)
Physician Discharge Summary  BRITLEE SKOLNIK UVO:[id number] DOB: 1950/12/29 DOA: 12/26/2017  PCP: [name], MD  Admit date: 12/26/2017 Discharge date: 12/28/2017  Admitted From: Home Disposition: Home   Recommendations for Outpatient Follow-up:  1. Follow up with PCP in the next week for hospital follow up and suture removal/wound check.  2. Follow up with cardiology for long term cardiac monitoring 3. Recheck BMP at follow up. Mild AKI was treated with IV fluids with improvement. Hemolyzed specimen caused spuriously elevated potassium.   Home Health: PT Equipment/Devices: Rolling walker Discharge Condition: Stable CODE STATUS: Full Diet recommendation: Heart healthy, carb-modified  Brief/Interim Summary: [name] is a 67 y.o. female with a history of HTN, HLD, IDT2DM, asthma, GERD, and stage III CKD who presented to the ED after a syncopal event at work. She was pushing a cart at work when the next thing she knew she was laying on the floor with a headache with people standing around her. She was mentating normally upon waking and denies any preceding symptoms. She's had a single similar episode while in a parking lot a few months ago. No witnessed seizure-like activity, postictal state, or focal neurological deficits. She denies recent chest pain, dyspnea, or palpitations.  Work up included elevated BP, afebrile, WBC 6.8, negative troponin, creatinine 1.65 from baseline of 1.3.  CT of head negative for acute intracranial abnormalities. CT of her C-spine is negative for bony fracture. CT-maxillofacial is negative for bony fracture, but showed small scalp hematoma.  Patient was placed on telemetry bed for observation. Echocardiogram performed showing no significant valvular disease, wall motion abnormalities, or ventricular dysfunction. PFO felt to be likely present. There have been no significant arrhythmias during observation period and no recurrent symptoms. She appeared to  be less steady on her feet while ambulating with physical therapy, felt to be unsafe to return home with 15 stairs to her home on 5/3, so was delayed in discharge by another day while working daily with PT.   Discharge Diagnoses:  Principal Problem:   Syncope Active Problems:   HYPERTENSION, BENIGN ESSENTIAL   Asthma   Insulin pump in place   Type II diabetes mellitus with renal manifestations (HCC)   HLD (hyperlipidemia)   HTN (hypertension)   Acute renal failure superimposed on stage 3 chronic kidney disease (HCC)  Syncope: History strongly suggestive of cardiogenic syncope with 2 episodes in past few months. NSR remaining on telemetry. Echo without significant abnormalities.  - Driving, etc. restrictions shared with patient - Discussed with cardiology master, will have insurance authorization for cardiac monitoring and will be arranged as outpatient (pt of Dr. Oval Linsey).   Trauma due to fall/syncope:  - CT head, cervical spine and maxillofacial without fracture.   Lip laceration: Left mandibular lacerations sutured in ED 5/2, will be due for wound recheck and suture removal in 5 days. Pt will follow up with PCP on/around 5/7.  - Continue amoxicillin. TDaP given.  AKI on stage III CKD: Improved w/IVF's overnight.  - Recheck at follow up and minimize nephrotoxins as able.  T2DM:  Restart insulin pump at discharge  HTN:  - Restart home medications at discharge. Will need follow up BMP.   Asthma: stable - Continue home medications  HLD:  - Continue pravastatin  Hemolyzed specimen: With K of 6.2 in absence of worsening renal function. Do not suspect true hyperkalemia.  Discharge Instructions Discharge Instructions    Diet - low sodium heart healthy   Complete by:  As directed  Diet Carb Modified   Complete by:  As directed    Discharge instructions   Complete by:  As directed    You were admitted after passing out (syncope). Work up for the cause of the episode  has been largely negative, and work up for the facial, head, and neck trauma has been reassuring, so you are stable for discharge with the following recommendations:  - Continue taking medications as you were before.  - You will be contacted by cardiologist's office to arrange cardiac monitoring as an outpatient. If you don't hear from them in the next week, call them. They are busy getting insurance authorization.  - In the meantime, until we know the cause of these episodes you should not drive, operate machinery, climb ladders, or take baths/go swimming, or perform any other activity that would be particularly dangerous if you had another episode. It is state law that you do not drive for at least 6 months or until a reversible cause of these spells is found and treated.  - Apply bacitracin to the sutured wounds.  - Take amoxicillin every 8 hours for 5 days to prevent infection of the wounds.  - Follow up with your PCP on or around 5/7 for wound recheck and suture removal.  - If you have another episode or notice numbness, weakness, facial droop, or slurred speech seek medical attention right away.   Increase activity slowly   Complete by:  As directed      Allergies as of 12/28/2017      Reactions   Codeine Hives, Itching, Nausea And Vomiting      Medication List    TAKE these medications   albuterol 108 (90 Base) MCG/ACT inhaler Commonly known as:  PROAIR HFA 2 puffs every 4 hours as needed only  if your can't catch your breath What changed:    how much to take  how to take this  when to take this  reasons to take this  additional instructions   amLODipine 10 MG tablet Commonly known as:  NORVASC Take 10 mg by mouth every evening.   amoxicillin 500 MG capsule Commonly known as:  AMOXIL Take 1 capsule (500 mg total) by mouth every 8 (eight) hours.   bacitracin ointment Apply topically 2 (two) times daily.   BAYER CONTOUR NEXT TEST test strip Generic drug:  glucose  blood use AS INSTRUCTED TO CHECK BLOOD SUGARS 7 TIMES A DAY   BAYER MICROLET LANCETS lancets Use as instructed to check blood sugar 7 times per day dx code Z30.86   BYSTOLIC 20 MG Tabs Generic drug:  Nebivolol HCl Take 20 mg by mouth daily.   docusate sodium 100 MG capsule Commonly known as:  COLACE Take 100 mg by mouth 2 (two) times daily as needed for mild constipation.   gabapentin 300 MG capsule Commonly known as:  NEURONTIN Take 1 capsule (300 mg total) by mouth 3 (three) times daily.   glucagon 1 MG injection Commonly known as:  GLUCAGON EMERGENCY Inject 1 mg into the vein once as needed (for low blood sugar).   hydrOXYzine 25 MG tablet Commonly known as:  ATARAX/VISTARIL Take 25 mg by mouth every 4 (four) hours as needed for anxiety or itching.   insulin lispro 100 UNIT/ML injection Commonly known as:  HUMALOG USE MAX 90 UNITS subcutaneously once daily WITH INSULIN PUMP   insulin pump Soln Inject into the skin continuous. Humalog   INVOKANA 300 MG Tabs tablet Generic drug:  canagliflozin  take 1 tablet by mouth once daily BEFORE BREAKFAST   losartan 50 MG tablet Commonly known as:  COZAAR Take 1 tablet (50 mg total) by mouth daily.   metFORMIN 500 MG 24 hr tablet Commonly known as:  GLUCOPHAGE-XR Take 3 tablets (1,500 mg total) by mouth daily with supper.   mometasone-formoterol 100-5 MCG/ACT Aero Commonly known as:  DULERA Take 2 puffs first thing in am and then another 2 puffs about 12 hours later. What changed:    how much to take  how to take this  when to take this  additional instructions   oxybutynin 10 MG 24 hr tablet Commonly known as:  DITROPAN-XL Take 10 mg by mouth at bedtime.   pantoprazole 40 MG tablet Commonly known as:  PROTONIX Take 30- 60 min before your first and last meals of the day What changed:    how much to take  how to take this  when to take this  additional instructions   pravastatin 80 MG tablet Commonly  known as:  PRAVACHOL Take 1 tablet (80 mg total) by mouth every morning. What changed:  when to take this   spironolactone 50 MG tablet Commonly known as:  ALDACTONE take 1 tablet by mouth once daily   sucralfate 1 g tablet Commonly known as:  CARAFATE Take 1 g by mouth 4 (four) times daily.   traMADol 50 MG tablet Commonly known as:  ULTRAM Take 1-2 tablets (50-100 mg total) by mouth every 6 (six) hours as needed for up to 5 days for moderate pain or severe pain.   VICTOZA 18 MG/3ML Sopn Generic drug:  liraglutide inject 1.2 milligram subcutaneously daily   Vitamin D (Ergocalciferol) 50000 units Caps capsule Commonly known as:  DRISDOL Take 50,000 Units by mouth every 7 (seven) days. On Wednesday            Durable Medical Equipment  (From admission, onward)        Start     Ordered   12/28/17 0749  For home use only DME Walker rolling  Once    Question:  Patient needs a walker to treat with the following condition  Answer:  Gait instability   12/28/17 0749     Follow-up Information    [name], MD. Schedule an appointment as soon as possible for a visit on 12/31/2017.   Specialty:  Internal Medicine Contact information: [address] [place] [address] [phone]        Skeet Latch, MD Follow up.   Specialty:  Cardiology Why:  You will be contacted to have cardiac monitoring arranged.  Contact information: [address] [place] [address] [place] [address] [phone]          Allergies  Allergen Reactions  . Codeine Hives, Itching and Nausea And Vomiting    Consultations:  None  Procedures/Studies: Ct Head Wo Contrast  Result Date: 12/27/2017 CLINICAL DATA:  Fall with swelling and pain. Left eye hematoma and laceration to the lip and chin. EXAM: CT HEAD WITHOUT CONTRAST CT MAXILLOFACIAL WITHOUT CONTRAST CT CERVICAL SPINE WITHOUT CONTRAST TECHNIQUE: Multidetector CT imaging of the head, cervical spine, and  maxillofacial structures were performed using the standard protocol without intravenous contrast. Multiplanar CT image reconstructions of the cervical spine and maxillofacial structures were also generated. COMPARISON:  Head CT 01/18/2006 FINDINGS: CT HEAD FINDINGS Brain: No mass lesion, intraparenchymal hemorrhage or extra-axial collection. No evidence of acute cortical infarct. Brain parenchyma and CSF-containing spaces are normal for age.  Vascular: No hyperdense vessel or atherosclerotic calcification. Skull: No calvarial fracture. Normal skull base. CT MAXILLOFACIAL FINDINGS Osseous: --Complex facial fracture types: No LeFort, zygomaticomaxillary complex or nasoorbitoethmoidal fracture. --Simple fracture types: None. --Mandible: No fracture or dislocation. Orbits: The globes are intact. Normal appearance of the intra- and extraconal fat. Symmetric extraocular muscles and optic nerves. Sinuses: No fluid levels or advanced mucosal thickening. Soft tissues: Left supraorbital hematoma. CT CERVICAL SPINE FINDINGS Alignment: No static subluxation. Facets are aligned. Occipital condyles and the lateral masses of C1-C2 are aligned. Skull base and vertebrae: No acute fracture. Soft tissues and spinal canal: No prevertebral fluid or swelling. No visible canal hematoma. Disc levels: C5-6 disc osteophyte complex with moderate bilateral neural foraminal stenosis. Upper chest: No pneumothorax, pulmonary nodule or pleural effusion. Other: Normal visualized paraspinal cervical soft tissues. IMPRESSION: 1. No acute intracranial abnormality. 2. No maxillofacial or calvarial fracture. 3. Small left supraorbital scalp hematoma. 4. No acute fracture or static subluxation of the cervical spine. Electronically Signed   By: Ulyses Jarred M.D.   On: 12/27/2017 01:21   Ct Cervical Spine Wo Contrast  Result Date: 12/27/2017 CLINICAL DATA:  Fall with swelling and pain. Left eye hematoma and laceration to the lip and chin. EXAM: CT HEAD  WITHOUT CONTRAST CT MAXILLOFACIAL WITHOUT CONTRAST CT CERVICAL SPINE WITHOUT CONTRAST TECHNIQUE: Multidetector CT imaging of the head, cervical spine, and maxillofacial structures were performed using the standard protocol without intravenous contrast. Multiplanar CT image reconstructions of the cervical spine and maxillofacial structures were also generated. COMPARISON:  Head CT 01/18/2006 FINDINGS: CT HEAD FINDINGS Brain: No mass lesion, intraparenchymal hemorrhage or extra-axial collection. No evidence of acute cortical infarct. Brain parenchyma and CSF-containing spaces are normal for age. Vascular: No hyperdense vessel or atherosclerotic calcification. Skull: No calvarial fracture. Normal skull base. CT MAXILLOFACIAL FINDINGS Osseous: --Complex facial fracture types: No LeFort, zygomaticomaxillary complex or nasoorbitoethmoidal fracture. --Simple fracture types: None. --Mandible: No fracture or dislocation. Orbits: The globes are intact. Normal appearance of the intra- and extraconal fat. Symmetric extraocular muscles and optic nerves. Sinuses: No fluid levels or advanced mucosal thickening. Soft tissues: Left supraorbital hematoma. CT CERVICAL SPINE FINDINGS Alignment: No static subluxation. Facets are aligned. Occipital condyles and the lateral masses of C1-C2 are aligned. Skull base and vertebrae: No acute fracture. Soft tissues and spinal canal: No prevertebral fluid or swelling. No visible canal hematoma. Disc levels: C5-6 disc osteophyte complex with moderate bilateral neural foraminal stenosis. Upper chest: No pneumothorax, pulmonary nodule or pleural effusion. Other: Normal visualized paraspinal cervical soft tissues. IMPRESSION: 1. No acute intracranial abnormality. 2. No maxillofacial or calvarial fracture. 3. Small left supraorbital scalp hematoma. 4. No acute fracture or static subluxation of the cervical spine. Electronically Signed   By: Ulyses Jarred M.D.   On: 12/27/2017 01:21   Ct  Maxillofacial Wo Contrast  Result Date: 12/27/2017 CLINICAL DATA:  Fall with swelling and pain. Left eye hematoma and laceration to the lip and chin. EXAM: CT HEAD WITHOUT CONTRAST CT MAXILLOFACIAL WITHOUT CONTRAST CT CERVICAL SPINE WITHOUT CONTRAST TECHNIQUE: Multidetector CT imaging of the head, cervical spine, and maxillofacial structures were performed using the standard protocol without intravenous contrast. Multiplanar CT image reconstructions of the cervical spine and maxillofacial structures were also generated. COMPARISON:  Head CT 01/18/2006 FINDINGS: CT HEAD FINDINGS Brain: No mass lesion, intraparenchymal hemorrhage or extra-axial collection. No evidence of acute cortical infarct. Brain parenchyma and CSF-containing spaces are normal for age. Vascular: No hyperdense vessel or atherosclerotic calcification. Skull: No calvarial fracture.  Normal skull base. CT MAXILLOFACIAL FINDINGS Osseous: --Complex facial fracture types: No LeFort, zygomaticomaxillary complex or nasoorbitoethmoidal fracture. --Simple fracture types: None. --Mandible: No fracture or dislocation. Orbits: The globes are intact. Normal appearance of the intra- and extraconal fat. Symmetric extraocular muscles and optic nerves. Sinuses: No fluid levels or advanced mucosal thickening. Soft tissues: Left supraorbital hematoma. CT CERVICAL SPINE FINDINGS Alignment: No static subluxation. Facets are aligned. Occipital condyles and the lateral masses of C1-C2 are aligned. Skull base and vertebrae: No acute fracture. Soft tissues and spinal canal: No prevertebral fluid or swelling. No visible canal hematoma. Disc levels: C5-6 disc osteophyte complex with moderate bilateral neural foraminal stenosis. Upper chest: No pneumothorax, pulmonary nodule or pleural effusion. Other: Normal visualized paraspinal cervical soft tissues. IMPRESSION: 1. No acute intracranial abnormality. 2. No maxillofacial or calvarial fracture. 3. Small left supraorbital scalp  hematoma. 4. No acute fracture or static subluxation of the cervical spine. Electronically Signed   By: Ulyses Jarred M.D.   On: 12/27/2017 01:21   ECHOCARDIOGRAM 12/27/2017: - Left ventricle: The cavity size was normal. Wall thickness was   normal. Systolic function was normal. The estimated ejection   fraction was in the range of 60% to 65%. Doppler parameters are   consistent with abnormal left ventricular relaxation (grade 1   diastolic dysfunction). - Atrial septum: PFO likely  Subjective: Facial pain improving, neck pain stable. No further syncope/no palpitations.  Discharge Exam: BP 116/61 (BP Location: Right Arm)   Pulse 62   Temp 98.2 F (36.8 C) (Oral)   Resp 16   Wt 76.2 kg (168 lb)   LMP  (LMP Unknown)   SpO2 98%   BMI 28.84 kg/m   General: Pt is alert, awake, not in acute distress HEENT: Left periorbital swelling with ecchymosis, visual acuity preserved, cornea and conjunctivae appear normal. Left chin along mandible and submandibular are 2 ~2cm lacerations with sutures in place well apposed edges.  Cardiovascular: RRR, S1/S2 +, no rubs, no gallops Respiratory: CTA bilaterally, no wheezing, no rhonchi Abdominal: Soft, NT, ND, bowel sounds + Neuro: Alert, oriented, no focal deficits.   Labs: Basic Metabolic Panel: Recent Labs  Lab 12/26/17 2344 12/27/17 0341 12/27/17 1405  NA 141 133* 138  K 3.9 6.2* 4.2  CL 100* 96* 100*  CO2 30 25 27   GLUCOSE 157* 216* 239*  BUN 19 20 20   CREATININE 1.65* 1.47* 1.37*  CALCIUM 9.2 8.7* 8.7*   CBC: Recent Labs  Lab 12/26/17 2344 12/27/17 0708  WBC 6.8 9.9  HGB 14.2 14.4  HCT 43.2 43.8  MCV 91.3 92.2  PLT 224 222   Time coordinating discharge: Approximately 40 minutes  Patrecia Pour, MD  [facility] 12/28/2017, 10:17 AM Pager [phone]

## 2017-12-27 NOTE — H&P (Signed)
History and Physical    [name]:[id number] DOB: Jun 23, 1951 DOA: 12/26/2017  Referring MD/NP/PA:   PCP: [name], MD   Patient coming from:  The patient is coming from home.  At baseline, pt is independent for most of ADL.  Chief Complaint: Syncope  HPI: [name] is a 67 y.o. female with medical history significant of hypertension, hyperlipidemia, diabetes mellitus, asthma, GERD, CKD 3, who presents with syncope.  Per her partner, patient was at her work, suddenly passed out while she was pushing a cart at about 10:20 PM. She fell face down on the ground, caused hematoma to left eye, skin laceration her lower lip and chin. She has pain in bilateral jaw.  Patient did not have prodromal symptoms.  Denies unilateral weakness, numbness or tingling of the extremities.  No facial droop, slurred speech.  No seizure activity during the event.  Patient also denies chest pain, shortness of breath, cough.  No fever or chills.  No nausea, vomiting, diarrhea or abdominal pain.  Denies symptoms of UTI.  ED Course: pt was found to have WBC 6.8, negative troponin, worsening renal function, temperature normal, elevated blood pressure 199/93, no tachycardia, no tachypnea, oxygen saturation 99% on room air, temperature normal.  CT of head is negative for acute intracranial abnormalities.  CT of her C-spine is negative for bony fracture.  CT-maxillofacial is negative for bony fracture, but showed small scalp hematoma.  Patient is placed on telemetry bed for observation.  Review of Systems:   General: no fevers, chills, no body weight gain, has fatigue HEENT: no blurry vision, hearing changes or sore throat Respiratory: no dyspnea, coughing, wheezing CV: no chest pain, no palpitations GI: no nausea, vomiting, abdominal pain, diarrhea, constipation GU: no dysuria, burning on urination, increased urinary frequency, hematuria  Ext: no leg edema Neuro: no unilateral weakness,  numbness, or tingling, no vision change or hearing loss. Had syncope. Skin: no rash. Has skin laceration MSK: No muscle spasm, no deformity, no limitation of range of movement in spin Heme: No easy bruising.  Travel history: No recent long distant travel.  Allergy:  Allergies  Allergen Reactions  . Codeine Hives, Itching and Nausea And Vomiting    Past Medical History:  Diagnosis Date  . Arthritis    L knee, hands, back   . Asthma   . Diabetes mellitus type 2, insulin dependent ([place])   . Fibromyalgia   . Full dentures   . GERD (gastroesophageal reflux disease)   . H/O hiatal hernia   . Headache    h/o migraines - was followed for a time with a wellness doctor  . History of esophageal dilatation   . History of rhabdomyolysis    03/ 2015  . Hyperlipidemia   . Hypertension   . Insulin pump in place    since 12/ 2014--  [employer]  . Osteoarthritis of left knee, primary localized 08/17/2014  . Renal insufficiency   . SUI (stress urinary incontinence, female)   . Wears glasses     Past Surgical History:  Procedure Laterality Date  . BLADDER SUSPENSION N/A 03/09/2014   Procedure: CYSTOSCOPY/SLING;  Surgeon: Reece Packer, MD;  Location: [facility];  Service: Urology;  Laterality: N/A;  . CARDIAC CATHETERIZATION  07-20-2009   DR Shelva Majestic   MODERATE LVH/  NORMAL  LVEF/  NORMAL CORONARY AND RENAL ARTERIES  . CARPAL TUNNEL RELEASE Right 2000  . CHOLECYSTECTOMY  1990  . COLONOSCOPY WITH ESOPHAGOGASTRODUODENOSCOPY (EGD)  06-18-2002  . ESOPHAGOGASTRODUODENOSCOPY (EGD) WITH ESOPHAGEAL DILATION  06-12-2010  . EXCISION LEFT UPPER ARM MASS  01-29-2014  . EYE SURGERY  2010   laser on R eye  . KNEE ARTHROSCOPY Right 1989  &  2002  . NEGATIVE SLEEP STUDY  2012   PER PT  . PARTIAL KNEE ARTHROPLASTY Left 08/17/2014   Procedure: LEFT UNICOMPARTMENTAL KNEE;  Surgeon: Johnny Bridge, MD;  Location: [place];  Service: Orthopedics;  Laterality: Left;    Social  History:  reports that she has never smoked. She has never used smokeless tobacco. She reports that she does not drink alcohol or use drugs.  Family History:  Family History  Problem Relation Age of Onset  . Diabetes Mother   . Hypertension Mother   . Lung cancer Mother        smoked  . Diabetes Father   . Hypertension Father   . Lung cancer Father        smoked  . Diabetes Brother   . Heart failure Sister   . Diabetes Maternal Grandmother   . Cancer Maternal Grandmother      Prior to Admission medications   Medication Sig Start Date End Date Taking? Authorizing Provider  albuterol (PROAIR HFA) 108 (90 Base) MCG/ACT inhaler 2 puffs every 4 hours as needed only  if your can't catch your breath Patient taking differently: Inhale 1-2 puffs into the lungs every 4 (four) hours as needed for wheezing.  01/20/16  Yes Tanda Rockers, MD  amLODipine (NORVASC) 10 MG tablet Take 10 mg by mouth every evening.    Yes [provider]  docusate sodium (COLACE) 100 MG capsule Take 100 mg by mouth 2 (two) times daily as needed for mild constipation.   Yes [provider]  gabapentin (NEURONTIN) 300 MG capsule Take 1 capsule (300 mg total) by mouth 3 (three) times daily. 10/17/15  Yes Elayne Snare, MD  glucagon (GLUCAGON EMERGENCY) 1 MG injection Inject 1 mg into the vein once as needed (for low blood sugar). 09/30/14  Yes Elayne Snare, MD  hydrOXYzine (ATARAX/VISTARIL) 25 MG tablet Take 25 mg by mouth every 4 (four) hours as needed for anxiety or itching.  01/20/15  Yes [provider]  Insulin Human (INSULIN PUMP) SOLN Inject into the skin continuous. Humalog   Yes [provider]  INVOKANA 300 MG TABS tablet take 1 tablet by mouth once daily BEFORE BREAKFAST 07/23/16  Yes Elayne Snare, MD  losartan (COZAAR) 50 MG tablet Take 1 tablet (50 mg total) by mouth daily. 11/11/15  Yes Elayne Snare, MD  mometasone-formoterol [facility]) 100-5 MCG/ACT AERO Take 2 puffs first thing in am  and then another 2 puffs about 12 hours later. Patient taking differently: Inhale 2 puffs into the lungs 2 (two) times daily.  01/20/16  Yes Tanda Rockers, MD  Nebivolol HCl (BYSTOLIC) 20 MG TABS Take 20 mg by mouth daily.   Yes [provider]  oxybutynin (DITROPAN-XL) 10 MG 24 hr tablet Take 10 mg by mouth at bedtime.  10/27/15  Yes [provider]  pantoprazole (PROTONIX) 40 MG tablet Take 30- 60 min before your first and last meals of the day Patient taking differently: Take 40 mg by mouth daily.  12/01/15  Yes Tanda Rockers, MD  pravastatin (PRAVACHOL) 80 MG tablet Take 1 tablet (80 mg total) by mouth every morning. Patient taking differently: Take 80 mg by mouth daily.  01/21/15  Yes Elayne Snare, MD  spironolactone (  ALDACTONE) 50 MG tablet take 1 tablet by mouth once daily 05/15/16  Yes Elayne Snare, MD  sucralfate (CARAFATE) 1 g tablet Take 1 g by mouth 4 (four) times daily.   Yes [provider]  VICTOZA 18 MG/3ML SOPN inject 1.2 milligram subcutaneously daily 07/23/16  Yes Elayne Snare, MD  Vitamin D, Ergocalciferol, (DRISDOL) 50000 units CAPS capsule Take 50,000 Units by mouth every 7 (seven) days. On Wednesday 10/17/15  Yes [provider]  BAYER CONTOUR NEXT TEST test strip use AS INSTRUCTED TO CHECK BLOOD SUGARS 7 TIMES A DAY 06/13/15   Elayne Snare, MD  BAYER MICROLET LANCETS lancets Use as instructed to check blood sugar 7 times per day dx code E11.65 06/13/15   Elayne Snare, MD  insulin lispro (HUMALOG) 100 UNIT/ML injection USE MAX 90 UNITS subcutaneously once daily WITH INSULIN PUMP Patient not taking: Reported on 12/27/2017 04/01/17   Elayne Snare, MD  metFORMIN (GLUCOPHAGE-XR) 500 MG 24 hr tablet Take 3 tablets (1,500 mg total) by mouth daily with supper. Patient not taking: Reported on 12/27/2017 09/01/15   Elayne Snare, MD    Physical Exam: Vitals:   12/27/17 0115 12/27/17 0130 12/27/17 0200 12/27/17 0300  BP: (!) 164/79 (!) 167/74 (!) 180/88 117/62    Pulse: 63 63 64 65  Resp: 15 13 14 13   Temp:      TempSrc:      SpO2: 98% 98% 99% 96%   General: Not in acute distress HEENT:       Eyes: PERRL, EOMI, no scleral icterus.       ENT: No discharge from the ears and nose, no pharynx injection, no tonsillar enlargement.        Neck: No JVD, no bruit, no mass felt. Heme: No neck lymph node enlargement. Cardiac: S1/S2, RRR, No murmurs, No gallops or rubs. Respiratory:  No rales, wheezing, rhonchi or rubs. GI: Soft, nondistended, nontender, no rebound pain, no organomegaly, BS present. GU: No hematuria Ext: No pitting leg edema bilaterally. 2+DP/PT pulse bilaterally. Musculoskeletal: No joint deformities, No joint redness or warmth, no limitation of ROM in spin. Skin: No rashes. Has skin laceration in lower chin. Neuro: Alert, oriented X3, cranial nerves II-XII grossly intact, moves all extremities normally. Muscle strength 5/5 in all extremities, sensation to light touch intact. Brachial reflex 2+ bilaterally. Negative Babinski's sign Psych: Patient is not psychotic, no suicidal or hemocidal ideation.  Labs on Admission: I have personally reviewed following labs and imaging studies  CBC: Recent Labs  Lab 12/26/17 2344  WBC 6.8  HGB 14.2  HCT 43.2  MCV 91.3  PLT 063   Basic Metabolic Panel: Recent Labs  Lab 12/26/17 2344  NA 141  K 3.9  CL 100*  CO2 30  GLUCOSE 157*  BUN 19  CREATININE 1.65*  CALCIUM 9.2   GFR: CrCl cannot be calculated (Unknown ideal weight.). Liver Function Tests: No results for input(s): AST, ALT, ALKPHOS, BILITOT, PROT, ALBUMIN in the last 168 hours. No results for input(s): LIPASE, AMYLASE in the last 168 hours. No results for input(s): AMMONIA in the last 168 hours. Coagulation Profile: No results for input(s): INR, PROTIME in the last 168 hours. Cardiac Enzymes: No results for input(s): CKTOTAL, CKMB, CKMBINDEX, TROPONINI in the last 168 hours. BNP (last 3 results) No results for input(s):  PROBNP in the last 8760 hours. HbA1C: No results for input(s): HGBA1C in the last 72 hours. CBG: Recent Labs  Lab 12/27/17 0017  GLUCAP 129*   Lipid Profile:  No results for input(s): CHOL, HDL, LDLCALC, TRIG, CHOLHDL, LDLDIRECT in the last 72 hours. Thyroid Function Tests: No results for input(s): TSH, T4TOTAL, FREET4, T3FREE, THYROIDAB in the last 72 hours. Anemia Panel: No results for input(s): VITAMINB12, FOLATE, FERRITIN, TIBC, IRON, RETICCTPCT in the last 72 hours. Urine analysis:    Component Value Date/Time   COLORURINE YELLOW 11/15/2013 0040   APPEARANCEUR TURBID (A) 11/15/2013 0040   LABSPEC 1.027 11/15/2013 0040   PHURINE 5.5 11/15/2013 0040   GLUCOSEU 250 (A) 11/15/2013 0040   GLUCOSEU >=1000 03/16/2013 1259   HGBUR LARGE (A) 11/15/2013 0040   HGBUR trace-intact 11/17/2010 1230   BILIRUBINUR neg 12/04/2013 1652   KETONESUR 15 (A) 11/15/2013 0040   PROTEINUR Neg 12/04/2013 1652   PROTEINUR 100 (A) 11/15/2013 0040   UROBILINOGEN negative 12/04/2013 1652   UROBILINOGEN 0.2 11/15/2013 0040   NITRITE neg 12/04/2013 1652   NITRITE NEGATIVE 11/15/2013 0040   LEUKOCYTESUR Negative 12/04/2013 1652   Sepsis Labs: @LABRCNTIP (procalcitonin:4,lacticidven:4) )No results found for this or any previous visit (from the past 240 hour(s)).   Radiological Exams on Admission: Ct Head Wo Contrast  Result Date: 12/27/2017 CLINICAL DATA:  Fall with swelling and pain. Left eye hematoma and laceration to the lip and chin. EXAM: CT HEAD WITHOUT CONTRAST CT MAXILLOFACIAL WITHOUT CONTRAST CT CERVICAL SPINE WITHOUT CONTRAST TECHNIQUE: Multidetector CT imaging of the head, cervical spine, and maxillofacial structures were performed using the standard protocol without intravenous contrast. Multiplanar CT image reconstructions of the cervical spine and maxillofacial structures were also generated. COMPARISON:  Head CT 01/18/2006 FINDINGS: CT HEAD FINDINGS Brain: No mass lesion, intraparenchymal  hemorrhage or extra-axial collection. No evidence of acute cortical infarct. Brain parenchyma and CSF-containing spaces are normal for age. Vascular: No hyperdense vessel or atherosclerotic calcification. Skull: No calvarial fracture. Normal skull base. CT MAXILLOFACIAL FINDINGS Osseous: --Complex facial fracture types: No LeFort, zygomaticomaxillary complex or nasoorbitoethmoidal fracture. --Simple fracture types: None. --Mandible: No fracture or dislocation. Orbits: The globes are intact. Normal appearance of the intra- and extraconal fat. Symmetric extraocular muscles and optic nerves. Sinuses: No fluid levels or advanced mucosal thickening. Soft tissues: Left supraorbital hematoma. CT CERVICAL SPINE FINDINGS Alignment: No static subluxation. Facets are aligned. Occipital condyles and the lateral masses of C1-C2 are aligned. Skull base and vertebrae: No acute fracture. Soft tissues and spinal canal: No prevertebral fluid or swelling. No visible canal hematoma. Disc levels: C5-6 disc osteophyte complex with moderate bilateral neural foraminal stenosis. Upper chest: No pneumothorax, pulmonary nodule or pleural effusion. Other: Normal visualized paraspinal cervical soft tissues. IMPRESSION: 1. No acute intracranial abnormality. 2. No maxillofacial or calvarial fracture. 3. Small left supraorbital scalp hematoma. 4. No acute fracture or static subluxation of the cervical spine. Electronically Signed   By: Ulyses Jarred M.D.   On: 12/27/2017 01:21   Ct Cervical Spine Wo Contrast  Result Date: 12/27/2017 CLINICAL DATA:  Fall with swelling and pain. Left eye hematoma and laceration to the lip and chin. EXAM: CT HEAD WITHOUT CONTRAST CT MAXILLOFACIAL WITHOUT CONTRAST CT CERVICAL SPINE WITHOUT CONTRAST TECHNIQUE: Multidetector CT imaging of the head, cervical spine, and maxillofacial structures were performed using the standard protocol without intravenous contrast. Multiplanar CT image reconstructions of the cervical  spine and maxillofacial structures were also generated. COMPARISON:  Head CT 01/18/2006 FINDINGS: CT HEAD FINDINGS Brain: No mass lesion, intraparenchymal hemorrhage or extra-axial collection. No evidence of acute cortical infarct. Brain parenchyma and CSF-containing spaces are normal for age. Vascular: No hyperdense vessel or atherosclerotic calcification.  Skull: No calvarial fracture. Normal skull base. CT MAXILLOFACIAL FINDINGS Osseous: --Complex facial fracture types: No LeFort, zygomaticomaxillary complex or nasoorbitoethmoidal fracture. --Simple fracture types: None. --Mandible: No fracture or dislocation. Orbits: The globes are intact. Normal appearance of the intra- and extraconal fat. Symmetric extraocular muscles and optic nerves. Sinuses: No fluid levels or advanced mucosal thickening. Soft tissues: Left supraorbital hematoma. CT CERVICAL SPINE FINDINGS Alignment: No static subluxation. Facets are aligned. Occipital condyles and the lateral masses of C1-C2 are aligned. Skull base and vertebrae: No acute fracture. Soft tissues and spinal canal: No prevertebral fluid or swelling. No visible canal hematoma. Disc levels: C5-6 disc osteophyte complex with moderate bilateral neural foraminal stenosis. Upper chest: No pneumothorax, pulmonary nodule or pleural effusion. Other: Normal visualized paraspinal cervical soft tissues. IMPRESSION: 1. No acute intracranial abnormality. 2. No maxillofacial or calvarial fracture. 3. Small left supraorbital scalp hematoma. 4. No acute fracture or static subluxation of the cervical spine. Electronically Signed   By: Ulyses Jarred M.D.   On: 12/27/2017 01:21   Ct Maxillofacial Wo Contrast  Result Date: 12/27/2017 CLINICAL DATA:  Fall with swelling and pain. Left eye hematoma and laceration to the lip and chin. EXAM: CT HEAD WITHOUT CONTRAST CT MAXILLOFACIAL WITHOUT CONTRAST CT CERVICAL SPINE WITHOUT CONTRAST TECHNIQUE: Multidetector CT imaging of the head, cervical spine,  and maxillofacial structures were performed using the standard protocol without intravenous contrast. Multiplanar CT image reconstructions of the cervical spine and maxillofacial structures were also generated. COMPARISON:  Head CT 01/18/2006 FINDINGS: CT HEAD FINDINGS Brain: No mass lesion, intraparenchymal hemorrhage or extra-axial collection. No evidence of acute cortical infarct. Brain parenchyma and CSF-containing spaces are normal for age. Vascular: No hyperdense vessel or atherosclerotic calcification. Skull: No calvarial fracture. Normal skull base. CT MAXILLOFACIAL FINDINGS Osseous: --Complex facial fracture types: No LeFort, zygomaticomaxillary complex or nasoorbitoethmoidal fracture. --Simple fracture types: None. --Mandible: No fracture or dislocation. Orbits: The globes are intact. Normal appearance of the intra- and extraconal fat. Symmetric extraocular muscles and optic nerves. Sinuses: No fluid levels or advanced mucosal thickening. Soft tissues: Left supraorbital hematoma. CT CERVICAL SPINE FINDINGS Alignment: No static subluxation. Facets are aligned. Occipital condyles and the lateral masses of C1-C2 are aligned. Skull base and vertebrae: No acute fracture. Soft tissues and spinal canal: No prevertebral fluid or swelling. No visible canal hematoma. Disc levels: C5-6 disc osteophyte complex with moderate bilateral neural foraminal stenosis. Upper chest: No pneumothorax, pulmonary nodule or pleural effusion. Other: Normal visualized paraspinal cervical soft tissues. IMPRESSION: 1. No acute intracranial abnormality. 2. No maxillofacial or calvarial fracture. 3. Small left supraorbital scalp hematoma. 4. No acute fracture or static subluxation of the cervical spine. Electronically Signed   By: Ulyses Jarred M.D.   On: 12/27/2017 01:21     EKG: Independently reviewed.  Sinus rhythm, QTC 461, anteroseptal infarction pattern  Assessment/Plan Principal Problem:   Syncope Active Problems:    HYPERTENSION, BENIGN ESSENTIAL   Asthma   Insulin pump in place   Type II diabetes mellitus with renal manifestations (HCC)   HLD (hyperlipidemia)   HTN (hypertension)   Acute renal failure superimposed on stage 3 chronic kidney disease (HCC)  Syncope: Etiology is not clear.  No prodromal symptoms.  CT scan of the head and neck negative.  The differential diagnosis is broad, including vasovagal syncope, TIA, drug abuse, orthostatic status, carotid artery stenosis, hypertrophic cardiomyopathy (IHSS). No seizure noted per her partner.  - Place on tele bed for obs - Orthostatic vital signs  - Carotid  doppler, MRI-brain - 2d echo - Neuro checks  - IVF: NS 100 cc/h - PT/OT eval and treat   Type II diabetes mellitus with renal manifestations: Last A1c 9.0 on 02/20/17, poorly controled. Patient is using Victoza, metformin, Invokana, insulin pump at home -will hold home meds.  -SSI  HTN:  -Continue home medications: Amlodipine, Bystolic -Hold spironolactone due to worsening renal function -Hold Cozaar due to worsening renal function -IV hydralazine prn  Asthma: stable -Dulera inhaler, as needed albuterol nebulizer  Insulin pump in place: not on tonight -hold off pump  HLD:  -Continue pravastatin  AoCKD-III: Baseline Cre is 1.1-1.3, pt's Cre is 1.65, BUN 19 on admission. Likely due to dehydration and continuation of diuretics - IVF as above - Follow up renal function by BMP - Hold spironolactone - US-renal  DVT ppx: SCD Code Status: Full code Family Communication:   Yes, patient's partner   at bed side Disposition Plan:  Anticipate discharge back to previous home environment Consults called:  none Admission status: Obs / tele         Date of Service 12/27/2017    [name] [facility] Pager [phone]  If 7PM-7AM, please contact night-coverage [email or web address] Password [email or web address] 12/27/2017, 3:55 AM

## 2017-12-27 NOTE — Consult Note (Signed)
[place] Nurse wound consult note Reason for Consult: Laceration to left chin (1cm shallow) and left lower lip (1cm through and through) following syncopal episode resulting in fall.  Areas were sutured in the ED (2 sutures to chin, 1 suture to lower lip) by Dr. Marlon Pel in the ED.  Large contusion to the left orbit. We are asked to consult for wound care instructions to the approximated (sutured) lacerations. Wound type: trauma Pressure Injury POA: NA Measurement: See above Wound bed:N/A Drainage (amount, consistency, odor) None Periwound: edematous, discolored with early bruising Dressing procedure/placement/frequency: I have provided Nursing with guidance for twice daily cleansing with NS and gentle patting of the area to dry.  This is to be followed by a thin layer of bacitracin ointment and the areas left open to air. See PCP for suture removal in 10-14 days.  [place] nursing team will not follow, but will remain available to this patient, the nursing and medical teams.  Please re-consult if needed. Thanks, Maudie Flakes, MSN, RN, [place], Arther Abbott  Pager# [phone]

## 2017-12-27 NOTE — Progress Notes (Signed)
  Echocardiogram 2D Echocardiogram has been performed.  [name] 12/27/2017, 9:49 AM

## 2017-12-27 NOTE — ED Notes (Signed)
Heart Healthy/Carb Modified diet lunch tray ordered.  

## 2017-12-27 NOTE — ED Notes (Signed)
Patient transported to Vascular 

## 2017-12-27 NOTE — Progress Notes (Signed)
[name] [id number] Admission Data: 12/27/2017 8:40 PM Attending Provider: No att. providers found  [id number], Bailey Mech, MD Consults/ Treatment Team:   [name] is a 67 y.o. female patient admitted from ED awake, alert  & orientated  X 3,  Full Code, VSS - Blood pressure 138/70, pulse 61, temperature 98.7 F (37.1 C), resp. rate 16, SpO2 98 %., , no c/o shortness of breath, no c/o chest pain, no distress noted. Tele # 20 placed and pt is currently running:normal sinus rhythm.   IV site WDL:  hand left, condition patent and no redness with a transparent dsg that's clean dry and intact.  Allergies:   Allergies  Allergen Reactions  . Codeine Hives, Itching and Nausea And Vomiting     Past Medical History:  Diagnosis Date  . Arthritis    L knee, hands, back   . Asthma   . Diabetes mellitus type 2, insulin dependent ([place])   . Fibromyalgia   . Full dentures   . GERD (gastroesophageal reflux disease)   . H/O hiatal hernia   . Headache    h/o migraines - was followed for a time with a wellness doctor  . History of esophageal dilatation   . History of rhabdomyolysis    03/ 2015  . Hyperlipidemia   . Hypertension   . Insulin pump in place    since 12/ 2014--  [employer]  . Osteoarthritis of left knee, primary localized 08/17/2014  . Renal insufficiency   . SUI (stress urinary incontinence, female)   . Wears glasses     History:  obtained from chart review. Tobacco/alcohol: denied none  Pt orientation to unit, room and routine. Information packet given to patient/family and safety video watched.  Admission INP armband ID verified with patient/family, and in place. SR up x 2, fall risk assessment complete with Patient and family verbalizing understanding of risks associated with falls. Pt verbalizes an understanding of how to use the call bell and to call for help before getting out of bed.  Skin, clean-dry- intact without evidence of bruising, or skin tears.   No  evidence of skin break down noted on exam. no rashes, no ecchymoses, no petechiae, no nodules, no jaundice, no purpura, laceration to chin and lower lip    Will cont to monitor and assist as needed.  Salley Slaughter, RN 12/27/2017 8:40 PM

## 2017-12-28 DIAGNOSIS — R55 Syncope and collapse: Secondary | ICD-10-CM | POA: Diagnosis not present

## 2017-12-28 LAB — GLUCOSE, CAPILLARY
Glucose-Capillary: 197 mg/dL — ABNORMAL HIGH (ref 65–99)
Glucose-Capillary: 251 mg/dL — ABNORMAL HIGH (ref 65–99)

## 2017-12-28 MED ORDER — TRAMADOL HCL 50 MG PO TABS: 50.0000 mg | ORAL_TABLET | Freq: Four times a day (QID) | ORAL | 0 refills | Status: AC | PRN

## 2017-12-28 MED ORDER — ACETAMINOPHEN 325 MG PO TABS: 650.0000 mg | ORAL_TABLET | Freq: Four times a day (QID) | ORAL | Status: DC | PRN

## 2017-12-28 MED ORDER — TRAMADOL HCL 50 MG PO TABS: 50.0000 mg | ORAL_TABLET | Freq: Four times a day (QID) | ORAL | Status: DC | PRN

## 2017-12-28 NOTE — Progress Notes (Signed)
Physical Therapy Treatment Patient Details Name: [name] MRN: [id number] DOB: September 06, 1950 Today's Date: 12/28/2017    History of Present Illness 67 yo female with onset of syncope at work, has fallen and struck her chin and lower lip. Screening for UTI without symptoms, and head/neck CT is negative. PMHx:  asthma, migraine, DM, HTN, CKD 3,      PT Comments    Pt is up to walk with no AD and much safer than yesterday.  Should not need AD for home and will not need to follow up with PT as she is essentially at her prior level of I for gait.  Will keep her on PT for hosp but may be ready to dc next visit as she is able to handle stairs.  Follow for endurance and to monitor safety with mobility.  Follow Up Recommendations  No PT follow up     Equipment Recommendations  None recommended by PT    Recommendations for Other Services       Precautions / Restrictions Precautions Precautions: Fall Precaution Comments: monitor sats and pulses Restrictions Weight Bearing Restrictions: No    Mobility  Bed Mobility Overal bed mobility: Modified Independent                Transfers Overall transfer level: Modified independent                  Ambulation/Gait Ambulation/Gait assistance: Supervision Ambulation Distance (Feet): 250 Feet Assistive device: None Gait Pattern/deviations: Step-through pattern;Decreased stride length;Wide base of support Gait velocity: reduced Gait velocity interpretation: <1.31 ft/sec, indicative of household ambulator General Gait Details: no AD needed as pt is not light headed with gait   Stairs Stairs: Yes Stairs assistance: Min guard(for safety) Stair Management: One rail Right;Forwards;Alternating pattern;Step to pattern(alternated up and step to down steps) Number of Stairs: 15 General stair comments: pt is careful and minimal SOB with effort   Wheelchair Mobility    Modified Rankin (Stroke Patients Only)        Balance     Sitting balance-Leahy Scale: Good       Standing balance-Leahy Scale: Fair                              Cognition Arousal/Alertness: Awake/alert Behavior During Therapy: WFL for tasks assessed/performed Overall Cognitive Status: Within Functional Limits for tasks assessed                                        Exercises      General Comments        Pertinent Vitals/Pain Pain Assessment: Faces Pain Score: 4  Faces Pain Scale: Hurts little more Pain Location: facial injury Pain Descriptors / Indicators: Tender Pain Intervention(s): Monitored during session    Home Living                      Prior Function            PT Goals (current goals can now be found in the care plan section) Acute Rehab PT Goals Patient Stated Goal: to get up the stairs and get home Progress towards PT goals: Progressing toward goals    Frequency    Min 3X/week      PT Plan Discharge plan needs to be updated    Co-evaluation  AM-PAC PT "6 Clicks" Daily Activity  Outcome Measure  Difficulty turning over in bed (including adjusting bedclothes, sheets and blankets)?: None Difficulty moving from lying on back to sitting on the side of the bed? : None Difficulty sitting down on and standing up from a chair with arms (e.g., wheelchair, bedside commode, etc,.)?: None Help needed moving to and from a bed to chair (including a wheelchair)?: None Help needed walking in hospital room?: A Little Help needed climbing 3-5 steps with a railing? : A Little 6 Click Score: 22    End of Session Equipment Utilized During Treatment: Gait belt Activity Tolerance: Patient tolerated treatment well Patient left: in bed;with call bell/phone within reach(sitting on side of bed) Nurse Communication: Mobility status PT Visit Diagnosis: Difficulty in walking, not elsewhere classified (R26.2);Dizziness and giddiness (R42) Pain - part of  body: (face)     Time: 1010-1034 PT Time Calculation (min) (ACUTE ONLY): 24 min  Charges:  $Gait Training: 8-22 mins $Therapeutic Activity: 8-22 mins                    G Codes:  Functional Assessment Tool Used: AM-PAC 6 Clicks Basic Mobility     Ramond Dial 12/28/2017, 11:06 AM   Mee Hives, PT MS Acute Rehab Dept. Number: [facility]

## 2017-12-28 NOTE — Plan of Care (Signed)
Nsg Discharge Note  Admit Date:  12/26/2017 Discharge date: 12/28/2017   JUDI JAFFE to be D/C'd Home per MD order.  AVS completed.  Copy for chart, and copy for patient signed, and dated. Patient/caregiver able to verbalize understanding.  Discharge Medication: Allergies as of 12/28/2017       Reactions   Codeine Hives, Itching, Nausea And Vomiting        Medication List     TAKE these medications    albuterol 108 (90 Base) MCG/ACT inhaler Commonly known as:  PROAIR HFA 2 puffs every 4 hours as needed only  if your can't catch your breath What changed:   how much to take how to take this when to take this reasons to take this additional instructions   amLODipine 10 MG tablet Commonly known as:  NORVASC Take 10 mg by mouth every evening.   amoxicillin 500 MG capsule Commonly known as:  AMOXIL Take 1 capsule (500 mg total) by mouth every 8 (eight) hours.   bacitracin ointment Apply topically 2 (two) times daily.   BAYER CONTOUR NEXT TEST test strip Generic drug:  glucose blood use AS INSTRUCTED TO CHECK BLOOD SUGARS 7 TIMES A DAY   BAYER MICROLET LANCETS lancets Use as instructed to check blood sugar 7 times per day dx code T01.60   BYSTOLIC 20 MG Tabs Generic drug:  Nebivolol HCl Take 20 mg by mouth daily.   docusate sodium 100 MG capsule Commonly known as:  COLACE Take 100 mg by mouth 2 (two) times daily as needed for mild constipation.   gabapentin 300 MG capsule Commonly known as:  NEURONTIN Take 1 capsule (300 mg total) by mouth 3 (three) times daily.   glucagon 1 MG injection Commonly known as:  GLUCAGON EMERGENCY Inject 1 mg into the vein once as needed (for low blood sugar).   hydrOXYzine 25 MG tablet Commonly known as:  ATARAX/VISTARIL Take 25 mg by mouth every 4 (four) hours as needed for anxiety or itching.   insulin lispro 100 UNIT/ML injection Commonly known as:  HUMALOG USE MAX 90 UNITS subcutaneously once daily WITH INSULIN  PUMP   insulin pump Soln Inject into the skin continuous. Humalog   INVOKANA 300 MG Tabs tablet Generic drug:  canagliflozin take 1 tablet by mouth once daily BEFORE BREAKFAST   losartan 50 MG tablet Commonly known as:  COZAAR Take 1 tablet (50 mg total) by mouth daily.   metFORMIN 500 MG 24 hr tablet Commonly known as:  GLUCOPHAGE-XR Take 3 tablets (1,500 mg total) by mouth daily with supper.   mometasone-formoterol 100-5 MCG/ACT Aero Commonly known as:  DULERA Take 2 puffs first thing in am and then another 2 puffs about 12 hours later. What changed:   how much to take how to take this when to take this additional instructions   oxybutynin 10 MG 24 hr tablet Commonly known as:  DITROPAN-XL Take 10 mg by mouth at bedtime.   pantoprazole 40 MG tablet Commonly known as:  PROTONIX Take 30- 60 min before your first and last meals of the day What changed:   how much to take how to take this when to take this additional instructions   pravastatin 80 MG tablet Commonly known as:  PRAVACHOL Take 1 tablet (80 mg total) by mouth every morning. What changed:  when to take this   spironolactone 50 MG tablet Commonly known as:  ALDACTONE take 1 tablet by mouth once daily   sucralfate 1 g  tablet Commonly known as:  CARAFATE Take 1 g by mouth 4 (four) times daily.   traMADol 50 MG tablet Commonly known as:  ULTRAM Take 1-2 tablets (50-100 mg total) by mouth every 6 (six) hours as needed for up to 5 days for moderate pain or severe pain.   VICTOZA 18 MG/3ML Sopn Generic drug:  liraglutide inject 1.2 milligram subcutaneously daily   Vitamin D (Ergocalciferol) 50000 units Caps capsule Commonly known as:  DRISDOL Take 50,000 Units by mouth every 7 (seven) days. On Wednesday               Durable Medical Equipment  (From admission, onward)          Start     Ordered   12/28/17 0749  For home use only DME Walker rolling  Once    Question:  Patient needs a  walker to treat with the following condition  Answer:  Gait instability   12/28/17 0749       Discharge Assessment: Vitals:   12/28/17 0908 12/28/17 1000  BP: 116/61 127/67  Pulse: 62 62  Resp: 16   Temp: 98.2 F (36.8 C)   SpO2: 98%    Skin clean, dry and intact without evidence of skin break down, no evidence of skin tears noted. IV catheter discontinued intact. Site without signs and symptoms of complications - no redness or edema noted at insertion site, patient denies c/o pain - only slight tenderness at site.  Dressing with slight pressure applied.  D/c Instructions-Education: Discharge instructions given to patient/family with verbalized understanding. D/c education completed with patient/family including follow up instructions, medication list, d/c activities limitations if indicated, with other d/c instructions as indicated by MD - patient able to verbalize understanding, all questions fully answered. Patient instructed to return to ED, call 911, or call MD for any changes in condition.  Patient escorted via [place], and D/C home via private auto.  Salley Slaughter, RN 12/28/2017 11:41 AM

## 2017-12-28 NOTE — Care Management Note (Signed)
Case Management Note  Patient Details  Name: [name] MRN: [id number] Date of Birth: Jun 06, 1951  Subjective/Objective:     Pt from home with spouse for syncopal episode.  Pt has RW at home but feels that she does not need it or PT.  PT did not recommend f/u.                 Action/Plan: Pt refused HH and DME RW.  No other needs identified.   Expected Discharge Date:  12/27/17               Expected Discharge Plan:  Home/Self Care  In-House Referral:  NA  Discharge planning Services  CM Consult  Post Acute Care Choice:  Home Health, Durable Medical Equipment Choice offered to:  Patient  DME Arranged:  Patient refused services(pt has walker- no RW needed.) DME Agency:  NA  HH Arranged:  Patient Refused [place] Agency:  NA  Status of Service:  Completed, signed off  If discussed at [place] of Stay Meetings, dates discussed:    Additional Comments:  Claudie Leach, RN 12/28/2017, 12:36 PM

## 2017-12-31 NOTE — Consult Note (Signed)
            [facility] CM Primary Care Navigator  12/31/2017  [name] 02-08-1951 [id number]   Attempt toseepatient at the bedside to identify possible discharge needsbutshe was already dischargedhome.  Patient was seen and evaluated for syncope, fall/ lip laceration.  Patient has discharge instruction to follow-up withprimary care provider on 12/31/17 and with cardiology (will be contacted to have cardiac monitoring arranged).   For additional questions please contact:  Edwena Felty A. Anevay Campanella, BSN, RN-BC [facility] PRIMARY CARE Navigator Cell: [phone]

## 2018-01-01 DIAGNOSIS — Z09 Encounter for follow-up examination after completed treatment for conditions other than malignant neoplasm: Secondary | ICD-10-CM | POA: Diagnosis not present

## 2018-01-01 DIAGNOSIS — R55 Syncope and collapse: Secondary | ICD-10-CM | POA: Diagnosis not present

## 2018-01-01 DIAGNOSIS — I10 Essential (primary) hypertension: Secondary | ICD-10-CM | POA: Diagnosis not present

## 2018-01-01 DIAGNOSIS — N289 Disorder of kidney and ureter, unspecified: Secondary | ICD-10-CM | POA: Diagnosis not present

## 2018-01-02 ENCOUNTER — Ambulatory Visit (INDEPENDENT_AMBULATORY_CARE_PROVIDER_SITE_OTHER): Payer: Medicare Other

## 2018-01-02 DIAGNOSIS — R55 Syncope and collapse: Secondary | ICD-10-CM | POA: Diagnosis not present

## 2018-01-06 DIAGNOSIS — E1122 Type 2 diabetes mellitus with diabetic chronic kidney disease: Secondary | ICD-10-CM | POA: Diagnosis not present

## 2018-01-06 DIAGNOSIS — N08 Glomerular disorders in diseases classified elsewhere: Secondary | ICD-10-CM | POA: Diagnosis not present

## 2018-01-06 DIAGNOSIS — E559 Vitamin D deficiency, unspecified: Secondary | ICD-10-CM | POA: Diagnosis not present

## 2018-01-06 DIAGNOSIS — Z1389 Encounter for screening for other disorder: Secondary | ICD-10-CM | POA: Diagnosis not present

## 2018-01-06 DIAGNOSIS — N183 Chronic kidney disease, stage 3 (moderate): Secondary | ICD-10-CM | POA: Diagnosis not present

## 2018-01-06 DIAGNOSIS — Z4802 Encounter for removal of sutures: Secondary | ICD-10-CM | POA: Diagnosis not present

## 2018-01-15 ENCOUNTER — Other Ambulatory Visit: Payer: Self-pay | Admitting: Endocrinology

## 2018-02-10 ENCOUNTER — Telehealth: Payer: Self-pay | Admitting: Cardiovascular Disease

## 2018-02-10 NOTE — Telephone Encounter (Signed)
New Message: ° ° ° ° ° ° °Pt is returning a call for test results ° °

## 2018-02-10 NOTE — Telephone Encounter (Signed)
Patient made aware of results and verbalized understanding.  Notes recorded by Skeet Latch, MD on 02/05/2018 at 2:40 PM EDT No arrhythmias noted.

## 2018-02-12 DIAGNOSIS — N08 Glomerular disorders in diseases classified elsewhere: Secondary | ICD-10-CM | POA: Diagnosis not present

## 2018-02-12 DIAGNOSIS — R413 Other amnesia: Secondary | ICD-10-CM | POA: Diagnosis not present

## 2018-02-12 DIAGNOSIS — E1122 Type 2 diabetes mellitus with diabetic chronic kidney disease: Secondary | ICD-10-CM | POA: Diagnosis not present

## 2018-02-12 DIAGNOSIS — N183 Chronic kidney disease, stage 3 (moderate): Secondary | ICD-10-CM | POA: Diagnosis not present

## 2018-02-27 ENCOUNTER — Other Ambulatory Visit: Payer: Self-pay | Admitting: Endocrinology

## 2018-03-12 ENCOUNTER — Ambulatory Visit: Payer: Medicare Other | Admitting: Endocrinology

## 2018-03-27 DIAGNOSIS — R229 Localized swelling, mass and lump, unspecified: Secondary | ICD-10-CM | POA: Insufficient documentation

## 2018-03-27 LAB — BASIC METABOLIC PANEL
BUN: 14 (ref 4–21)
CREATININE: 1.3 — AB (ref 0.5–1.1)
GLUCOSE: 204
POTASSIUM: 4.8 (ref 3.4–5.3)
Sodium: 138 (ref 137–147)

## 2018-03-27 LAB — HEMOGLOBIN A1C: Hgb A1c MFr Bld: 11.3 — AB (ref 4.0–6.0)

## 2018-05-01 ENCOUNTER — Encounter: Payer: Self-pay | Admitting: Podiatry

## 2018-05-01 ENCOUNTER — Ambulatory Visit (INDEPENDENT_AMBULATORY_CARE_PROVIDER_SITE_OTHER): Payer: Medicare Other | Admitting: Podiatry

## 2018-05-01 ENCOUNTER — Ambulatory Visit (INDEPENDENT_AMBULATORY_CARE_PROVIDER_SITE_OTHER): Payer: Medicare Other

## 2018-05-01 DIAGNOSIS — E1149 Type 2 diabetes mellitus with other diabetic neurological complication: Secondary | ICD-10-CM | POA: Diagnosis not present

## 2018-05-01 DIAGNOSIS — M2041 Other hammer toe(s) (acquired), right foot: Secondary | ICD-10-CM | POA: Diagnosis not present

## 2018-05-01 DIAGNOSIS — M2042 Other hammer toe(s) (acquired), left foot: Secondary | ICD-10-CM

## 2018-05-01 DIAGNOSIS — G629 Polyneuropathy, unspecified: Secondary | ICD-10-CM

## 2018-05-01 DIAGNOSIS — M21619 Bunion of unspecified foot: Secondary | ICD-10-CM

## 2018-05-01 NOTE — Progress Notes (Signed)
Subjective:    Patient ID: [name], female    DOB: 1951-02-20, 67 y.o.   MRN: [id number]  HPI 67 year old female presents the office today for concerns of pain to her left foot mostly on the first and fifth toes and she describes a throbbing sensation to the toes.  She states that she also gets some burning to the bottoms of both of her feet.  She has been on gabapentin for neuropathy.  She is diabetic and she thinks her A1c was around 11.  She has had a cold with a burning feeling to her feet.  She has no other concerns today.   Review of Systems  All other systems reviewed and are negative.  Past Medical History:  Diagnosis Date  . Arthritis    L knee, hands, back   . Asthma   . Diabetes mellitus type 2, insulin dependent ([place])   . Fibromyalgia   . Full dentures   . GERD (gastroesophageal reflux disease)   . H/O hiatal hernia   . Headache    h/o migraines - was followed for a time with a wellness doctor  . History of esophageal dilatation   . History of rhabdomyolysis    03/ 2015  . Hyperlipidemia   . Hypertension   . Insulin pump in place    since 12/ 2014--  [employer]  . Osteoarthritis of left knee, primary localized 08/17/2014  . Renal insufficiency   . SUI (stress urinary incontinence, female)   . Wears glasses     Past Surgical History:  Procedure Laterality Date  . BLADDER SUSPENSION N/A 03/09/2014   Procedure: CYSTOSCOPY/SLING;  Surgeon: Reece Packer, MD;  Location: [facility];  Service: Urology;  Laterality: N/A;  . CARDIAC CATHETERIZATION  07-20-2009   DR Shelva Majestic   MODERATE LVH/  NORMAL  LVEF/  NORMAL CORONARY AND RENAL ARTERIES  . CARPAL TUNNEL RELEASE Right 2000  . CHOLECYSTECTOMY  1990  . COLONOSCOPY WITH ESOPHAGOGASTRODUODENOSCOPY (EGD)  06-18-2002  . ESOPHAGOGASTRODUODENOSCOPY (EGD) WITH ESOPHAGEAL DILATION  06-12-2010  . EXCISION LEFT UPPER ARM MASS  01-29-2014  . EYE SURGERY  2010   laser on R eye  . KNEE  ARTHROSCOPY Right 1989  &  2002  . NEGATIVE SLEEP STUDY  2012   PER PT  . PARTIAL KNEE ARTHROPLASTY Left 08/17/2014   Procedure: LEFT UNICOMPARTMENTAL KNEE;  Surgeon: Johnny Bridge, MD;  Location: [facility];  Service: Orthopedics;  Laterality: Left;     Current Outpatient Medications:  .  albuterol (PROAIR HFA) 108 (90 Base) MCG/ACT inhaler, 2 puffs every 4 hours as needed only  if your can't catch your breath (Patient taking differently: Inhale 1-2 puffs into the lungs every 4 (four) hours as needed for wheezing. ), Disp: 1 Inhaler, Rfl: 11 .  amLODipine (NORVASC) 10 MG tablet, Take 10 mg by mouth every evening. , Disp: , Rfl:  .  amoxicillin (AMOXIL) 500 MG capsule, Take 1 capsule (500 mg total) by mouth every 8 (eight) hours., Disp: 15 capsule, Rfl: 0 .  bacitracin ointment, Apply topically 2 (two) times daily., Disp: 120 g, Rfl: 0 .  BAYER CONTOUR NEXT TEST test strip, use AS INSTRUCTED TO CHECK BLOOD SUGARS 7 TIMES A DAY, Disp: 300 each, Rfl: 5 .  BAYER MICROLET LANCETS lancets, Use as instructed to check blood sugar 7 times per day dx code E11.65, Disp: 300 each, Rfl: 5 .  docusate sodium (COLACE) 100 MG capsule, Take 100 mg  by mouth 2 (two) times daily as needed for mild constipation., Disp: , Rfl:  .  gabapentin (NEURONTIN) 300 MG capsule, Take 1 capsule (300 mg total) by mouth 3 (three) times daily., Disp: 90 capsule, Rfl: 3 .  glucagon (GLUCAGON EMERGENCY) 1 MG injection, Inject 1 mg into the vein once as needed (for low blood sugar)., Disp: 1 each, Rfl: 12 .  hydrOXYzine (ATARAX/VISTARIL) 25 MG tablet, Take 25 mg by mouth every 4 (four) hours as needed for anxiety or itching. , Disp: , Rfl: 0 .  Insulin Human (INSULIN PUMP) SOLN, Inject into the skin continuous. Humalog, Disp: , Rfl:  .  insulin lispro (HUMALOG) 100 UNIT/ML injection, INJECT MAX OF 90 UNITS SUBCUTANEOUS EVERY DAY WITH INSULIN PUMP, Disp: 40 mL, Rfl: 0 .  INVOKANA 300 MG TABS tablet, take 1 tablet by mouth once daily  BEFORE BREAKFAST, Disp: 30 tablet, Rfl: 3 .  losartan (COZAAR) 50 MG tablet, Take 1 tablet (50 mg total) by mouth daily., Disp: 90 tablet, Rfl: 3 .  metFORMIN (GLUCOPHAGE-XR) 500 MG 24 hr tablet, Take 3 tablets (1,500 mg total) by mouth daily with supper. (Patient not taking: Reported on 12/27/2017), Disp: 90 tablet, Rfl: 3 .  mometasone-formoterol (DULERA) 100-5 MCG/ACT AERO, Take 2 puffs first thing in am and then another 2 puffs about 12 hours later. (Patient taking differently: Inhale 2 puffs into the lungs 2 (two) times daily. ), Disp: 1 Inhaler, Rfl: 11 .  Nebivolol HCl (BYSTOLIC) 20 MG TABS, Take 20 mg by mouth daily., Disp: , Rfl:  .  oxybutynin (DITROPAN-XL) 10 MG 24 hr tablet, Take 10 mg by mouth at bedtime. , Disp: , Rfl: 1 .  pantoprazole (PROTONIX) 40 MG tablet, Take 30- 60 min before your first and last meals of the day (Patient taking differently: Take 40 mg by mouth daily. ), Disp: 60 tablet, Rfl: 11 .  pravastatin (PRAVACHOL) 80 MG tablet, Take 1 tablet (80 mg total) by mouth every morning. (Patient taking differently: Take 80 mg by mouth daily. ), Disp: 30 tablet, Rfl: 3 .  spironolactone (ALDACTONE) 50 MG tablet, take 1 tablet by mouth once daily, Disp: 30 tablet, Rfl: 5 .  sucralfate (CARAFATE) 1 g tablet, Take 1 g by mouth 4 (four) times daily., Disp: , Rfl:  .  VICTOZA 18 MG/3ML SOPN, inject 1.2 milligram subcutaneously daily, Disp: 18 mL, Rfl: 1 .  Vitamin D, Ergocalciferol, (DRISDOL) 50000 units CAPS capsule, Take 50,000 Units by mouth every 7 (seven) days. On Wednesday, Disp: , Rfl: 0  Allergies  Allergen Reactions  . Codeine Hives, Itching and Nausea And Vomiting         Objective:   Physical Exam  General: AAO x3, NAD  Dermatological: No open lesions are identified bilaterally.  Vascular: DP, PT pulses 2/4, CRT less than 3 seconds.  There is no pain with calf compression, swelling, warmth or erythema.  Neruologic: Gross sensation appears intact with Derrel Nip monofilament and vibratory sensation decreased. Musculoskeletal: No gross boney pedal deformities bilateral. No pain, crepitus, or limitation noted with foot and ankle range of motion bilateral. Muscular strength 5/5 in all groups tested bilateral.  Musculoskeletal: Bunion deformity as well as hammertoes are present.  There is no area pinpoint tenderness identified bilaterally.  Flatfoot deformity present.  There is no pain with ankle subtalar joint range of motion.  Extensor tendons, flexor tendons, Achilles tendon appear to be intact.    Assessment & Plan:  67 year old female with bilateral foot  pain -Treatment options discussed including all alternatives, risks, and complications -Etiology of symptoms were discussed -X-rays were obtained and reviewed with the patient.  Bunion deformity as well as hammertoes are present.  Inferior calcaneal spurring is present. -I do think a lot of her symptoms are due to neuropathy.  Continue gabapentin.  We discussed different things that she can utilize as well to help with her pain. -Given her overall foot pain I do think she will benefit from diabetic shoes given her neuropathy as well as digital deformity.  [place] was completed today for precertification diabetic shoes.  Trula Slade DPM

## 2018-05-06 ENCOUNTER — Ambulatory Visit: Payer: Medicare Other | Admitting: Orthotics

## 2018-05-06 DIAGNOSIS — M21619 Bunion of unspecified foot: Secondary | ICD-10-CM

## 2018-05-06 DIAGNOSIS — M2041 Other hammer toe(s) (acquired), right foot: Secondary | ICD-10-CM

## 2018-05-06 DIAGNOSIS — E1149 Type 2 diabetes mellitus with other diabetic neurological complication: Secondary | ICD-10-CM

## 2018-05-06 DIAGNOSIS — M2042 Other hammer toe(s) (acquired), left foot: Secondary | ICD-10-CM

## 2018-05-06 NOTE — Progress Notes (Signed)

## 2018-05-17 ENCOUNTER — Encounter: Payer: Self-pay | Admitting: Internal Medicine

## 2018-05-17 DIAGNOSIS — R229 Localized swelling, mass and lump, unspecified: Secondary | ICD-10-CM

## 2018-05-28 ENCOUNTER — Ambulatory Visit: Payer: Medicare Other | Admitting: Nurse Practitioner

## 2018-05-29 ENCOUNTER — Ambulatory Visit: Payer: Medicare Other | Admitting: Podiatry

## 2018-05-29 ENCOUNTER — Ambulatory Visit: Payer: Medicare Other | Admitting: Orthotics

## 2018-06-02 ENCOUNTER — Other Ambulatory Visit: Payer: Self-pay | Admitting: Endocrinology

## 2018-06-04 ENCOUNTER — Other Ambulatory Visit: Payer: Self-pay | Admitting: Internal Medicine

## 2018-06-04 DIAGNOSIS — Z1231 Encounter for screening mammogram for malignant neoplasm of breast: Secondary | ICD-10-CM

## 2018-06-09 ENCOUNTER — Other Ambulatory Visit: Payer: Self-pay | Admitting: Nurse Practitioner

## 2018-06-09 ENCOUNTER — Ambulatory Visit (INDEPENDENT_AMBULATORY_CARE_PROVIDER_SITE_OTHER): Payer: Medicare Other | Admitting: Nurse Practitioner

## 2018-06-09 ENCOUNTER — Other Ambulatory Visit: Payer: Self-pay

## 2018-06-09 ENCOUNTER — Encounter: Payer: Self-pay | Admitting: Nurse Practitioner

## 2018-06-09 VITALS — BP 134/90 | HR 83 | Temp 98.2°F | Ht 64.0 in | Wt 169.4 lb

## 2018-06-09 DIAGNOSIS — E119 Type 2 diabetes mellitus without complications: Secondary | ICD-10-CM | POA: Diagnosis not present

## 2018-06-09 DIAGNOSIS — R413 Other amnesia: Secondary | ICD-10-CM | POA: Diagnosis not present

## 2018-06-09 DIAGNOSIS — I10 Essential (primary) hypertension: Secondary | ICD-10-CM

## 2018-06-09 DIAGNOSIS — E1165 Type 2 diabetes mellitus with hyperglycemia: Secondary | ICD-10-CM

## 2018-06-09 MED ORDER — SEMAGLUTIDE(0.25 OR 0.5MG/DOS) 2 MG/1.5ML ~~LOC~~ SOPN: 0.5000 mg | PEN_INJECTOR | SUBCUTANEOUS | 1 refills | Status: DC

## 2018-06-09 MED ORDER — SPIRONOLACTONE 50 MG PO TABS: 50.0000 mg | ORAL_TABLET | Freq: Every day | ORAL | 1 refills | Status: DC

## 2018-06-09 MED ORDER — NEBIVOLOL HCL 20 MG PO TABS: 20.0000 mg | ORAL_TABLET | Freq: Every day | ORAL | 1 refills | Status: DC

## 2018-06-09 MED ORDER — VITAMIN D (ERGOCALCIFEROL) 1.25 MG (50000 UNIT) PO CAPS: 50000.0000 [IU] | ORAL_CAPSULE | ORAL | 0 refills | Status: DC

## 2018-06-09 MED ORDER — INSULIN LISPRO 100 UNIT/ML ~~LOC~~ SOLN: SUBCUTANEOUS | 0 refills | Status: DC

## 2018-06-09 MED ORDER — GABAPENTIN 300 MG PO CAPS: 300.0000 mg | ORAL_CAPSULE | Freq: Three times a day (TID) | ORAL | 3 refills | Status: DC

## 2018-06-09 NOTE — Progress Notes (Signed)
Subjective:     Patient ID: [name] , female    DOB: [birth year] , 67 y.o.   MRN: [id number]   Memory loss - per family member the patient is having  More difficulty with remembering simple things. Will leave the stove on. She is concerned with her driving and is no longer driving.    Diabetes  She presents for her follow-up diabetic visit. She has type 2 diabetes mellitus. Her disease course has been fluctuating. Pertinent negatives for hypoglycemia include no confusion or dizziness. Pertinent negatives for hypoglycemia complications include no blackouts. (None since stopping her insulin pump) Symptoms are stable. Risk factors for coronary artery disease include diabetes mellitus, sedentary lifestyle and obesity. Current diabetic treatment includes insulin injections and oral agent (dual therapy). She is compliant with treatment most of the time. She is following a diabetic (She is drinking more water.  ) diet. She has had a previous visit with a dietitian. She participates in exercise intermittently. Her home blood glucose trend is decreasing steadily. (Blood sugar ranging 60-360.  No longer on insulin pump.    Humalog 20 units at breakfast Humalog 35 units at lunch time Humalog 20 units at dinner  Lunch and dinner usually really elevated levels. ) She does not see a podiatrist.Eye exam is not current.     Past Medical History:  Diagnosis Date  . Arthritis    L knee, hands, back   . Asthma   . Diabetes mellitus type 2, insulin dependent ([place])   . Fibromyalgia   . Full dentures   . GERD (gastroesophageal reflux disease)   . H/O hiatal hernia   . Headache    h/o migraines - was followed for a time with a wellness doctor  . History of esophageal dilatation   . History of rhabdomyolysis    03/ 2015  . Hyperlipidemia   . Hypertension   . Insulin pump in place    since 12/ 2014--  [facility]  . Osteoarthritis of left knee, primary localized 08/17/2014  . Renal  insufficiency   . SUI (stress urinary incontinence, female)   . Wears glasses       Current Outpatient Medications:  .  albuterol (PROAIR HFA) 108 (90 Base) MCG/ACT inhaler, 2 puffs every 4 hours as needed only  if your can't catch your breath (Patient taking differently: Inhale 1-2 puffs into the lungs every 4 (four) hours as needed for wheezing. ), Disp: 1 Inhaler, Rfl: 11 .  amLODipine (NORVASC) 10 MG tablet, Take 10 mg by mouth every evening. , Disp: , Rfl:  .  BAYER CONTOUR NEXT TEST test strip, use AS INSTRUCTED TO CHECK BLOOD SUGARS 7 TIMES A DAY, Disp: 300 each, Rfl: 5 .  BAYER MICROLET LANCETS lancets, Use as instructed to check blood sugar 7 times per day dx code E11.65, Disp: 300 each, Rfl: 5 .  docusate sodium (COLACE) 100 MG capsule, Take 100 mg by mouth 2 (two) times daily as needed for mild constipation., Disp: , Rfl:  .  gabapentin (NEURONTIN) 300 MG capsule, Take 1 capsule (300 mg total) by mouth 3 (three) times daily., Disp: 90 capsule, Rfl: 3 .  hydrOXYzine (ATARAX/VISTARIL) 25 MG tablet, Take 25 mg by mouth every 4 (four) hours as needed for anxiety or itching. , Disp: , Rfl: 0 .  insulin lispro (HUMALOG) 100 UNIT/ML injection, INJECT MAX OF 90 UNITS SUBCUTANEOUSLY EVERY DAY WITH INSULIN PUMP, Disp: 40 mL, Rfl: 0 .  INVOKANA 300 MG TABS tablet,  take 1 tablet by mouth once daily BEFORE BREAKFAST, Disp: 30 tablet, Rfl: 3 .  losartan (COZAAR) 50 MG tablet, Take 1 tablet (50 mg total) by mouth daily., Disp: 90 tablet, Rfl: 3 .  mometasone-formoterol (DULERA) 100-5 MCG/ACT AERO, Take 2 puffs first thing in am and then another 2 puffs about 12 hours later. (Patient taking differently: Inhale 2 puffs into the lungs 2 (two) times daily. ), Disp: 1 Inhaler, Rfl: 11 .  Nebivolol HCl (BYSTOLIC) 20 MG TABS, Take 20 mg by mouth daily., Disp: , Rfl:  .  oxybutynin (DITROPAN-XL) 10 MG 24 hr tablet, Take 10 mg by mouth at bedtime. , Disp: , Rfl: 1 .  pantoprazole (PROTONIX) 40 MG tablet, Take  30- 60 min before your first and last meals of the day (Patient taking differently: Take 40 mg by mouth daily. ), Disp: 60 tablet, Rfl: 11 .  pravastatin (PRAVACHOL) 80 MG tablet, Take 1 tablet (80 mg total) by mouth every morning. (Patient taking differently: Take 80 mg by mouth daily. ), Disp: 30 tablet, Rfl: 3 .  Semaglutide,0.25 or 0.5MG /DOS, (OZEMPIC, 0.25 OR 0.5 MG/DOSE,) 2 MG/1.5ML SOPN, Inject into the skin. Inject 0.5mg  by subcutaneous route on the same day of each week in the abdomen, thighs or upper arm., Disp: , Rfl:  .  spironolactone (ALDACTONE) 50 MG tablet, take 1 tablet by mouth once daily, Disp: 30 tablet, Rfl: 5 .  sucralfate (CARAFATE) 1 g tablet, Take 1 g by mouth 4 (four) times daily., Disp: , Rfl:  .  Vitamin D, Ergocalciferol, (DRISDOL) 50000 units CAPS capsule, Take 50,000 Units by mouth every 7 (seven) days. On Wednesday, Disp: , Rfl: 0 .  bacitracin ointment, Apply topically 2 (two) times daily. (Patient not taking: Reported on 06/09/2018), Disp: 120 g, Rfl: 0 .  glucagon (GLUCAGON EMERGENCY) 1 MG injection, Inject 1 mg into the vein once as needed (for low blood sugar). (Patient not taking: Reported on 06/09/2018), Disp: 1 each, Rfl: 12 .  insulin aspart (NOVOLOG) 100 UNIT/ML injection, Inject into the skin 3 (three) times daily before meals. Insulin pump, it gives the pt her insulin dose based off of how much carbohydrates the pt has consumed, Disp: , Rfl:  .  Insulin Human (INSULIN PUMP) SOLN, Inject into the skin continuous. Humalog, Disp: , Rfl:  .  metFORMIN (GLUCOPHAGE-XR) 500 MG 24 hr tablet, Take 3 tablets (1,500 mg total) by mouth daily with supper. (Patient not taking: Reported on 06/09/2018), Disp: 90 tablet, Rfl: 3 .  VICTOZA 18 MG/3ML SOPN, inject 1.2 milligram subcutaneously daily (Patient not taking: Reported on 06/09/2018), Disp: 18 mL, Rfl: 1   Allergies  Allergen Reactions  . Pollen Extract   . Tomato   . Codeine Hives, Itching and Nausea And Vomiting      Review of Systems  Constitutional: Negative.   HENT: Negative.   Eyes: Negative.   Respiratory: Negative.   Cardiovascular: Negative.   Gastrointestinal: Negative.   Genitourinary: Negative.   Skin: Negative.   Neurological: Negative.  Negative for dizziness.       Memory loss   Psychiatric/Behavioral: Negative for confusion.     Today's Vitals   06/09/18 1147  BP: 134/90  Pulse: 83  Temp: 98.2 F (36.8 C)  TempSrc: Oral  SpO2: 97%  Weight: 169 lb 6.4 oz (76.8 kg)  Height: 5\' 4"  (1.626 m)  PainSc: 0-No pain   Body mass index is 29.08 kg/m.   Objective:  Physical Exam  Constitutional: She  appears well-developed and well-nourished.  Cardiovascular: Normal rate, regular rhythm, normal heart sounds and intact distal pulses.  Pulmonary/Chest: Effort normal and breath sounds normal.  Musculoskeletal: Normal range of motion.  Skin: Skin is warm and dry. Capillary refill takes less than 2 seconds.        Assessment And Plan:   1. Diabetes mellitus without complication (HCC)  Chronic, poorly controlled  She is no longer seeing Dr. [name], she is coming here for her diabetes maanagement.   No longer on insulin pump  Taking Ozempic 0.5mg  weekly  Continue with current medications (Humalog and Invokana) - will likely change from Invokana and consider increasing her Ozempic to 1 mg pending lab results. - CMP14 + Anion Gap - Hemoglobin A1c  2. HYPERTENSION, BENIGN ESSENTIAL  Chronic, controlled  Continue with current medications - CMP14 + Anion Gap  3. Memory loss  Family feels her memory is worsening  Memory score 5 for Mini cog.  Will check for metabolic causes.    Pending labs if normal will consider donepezil   Discussed increased risk for small vessel disease related to chronic disease.    She is no longer driving as well - Vitamin B12 - TSH - T pallidum Screening [place]       [name], FNP

## 2018-06-10 LAB — CMP14 + ANION GAP
A/G RATIO: 1.3 (ref 1.2–2.2)
ALT: 10 IU/L (ref 0–32)
AST: 15 IU/L (ref 0–40)
Albumin: 4.1 g/dL (ref 3.6–4.8)
Alkaline Phosphatase: 110 IU/L (ref 39–117)
Anion Gap: 18 mmol/L (ref 10.0–18.0)
BUN/Creatinine Ratio: 11 — ABNORMAL LOW (ref 12–28)
BUN: 15 mg/dL (ref 8–27)
Bilirubin Total: 0.4 mg/dL (ref 0.0–1.2)
CALCIUM: 9.6 mg/dL (ref 8.7–10.3)
CHLORIDE: 98 mmol/L (ref 96–106)
CO2: 24 mmol/L (ref 20–29)
Creatinine, Ser: 1.31 mg/dL — ABNORMAL HIGH (ref 0.57–1.00)
GFR, EST AFRICAN AMERICAN: 49 mL/min/{1.73_m2} — AB (ref >59)
GFR, EST NON AFRICAN AMERICAN: 43 mL/min/{1.73_m2} — AB (ref >59)
GLOBULIN, TOTAL: 3.1 g/dL (ref 1.5–4.5)
Glucose: 262 mg/dL — ABNORMAL HIGH (ref 65–99)
Potassium: 4.6 mmol/L (ref 3.5–5.2)
Sodium: 140 mmol/L (ref 134–144)
Total Protein: 7.2 g/dL (ref 6.0–8.5)

## 2018-06-10 LAB — TSH: TSH: 1.99 u[IU]/mL (ref 0.450–4.500)

## 2018-06-10 LAB — HEMOGLOBIN A1C
Est. average glucose Bld gHb Est-mCnc: 266 mg/dL
HEMOGLOBIN A1C: 10.9 % — AB (ref 4.8–5.6)

## 2018-06-10 LAB — VITAMIN B12: VITAMIN B 12: 421 pg/mL (ref 232–1245)

## 2018-06-10 LAB — T PALLIDUM SCREENING CASCADE: T PALLIDUM ANTIBODIES (TP-PA): NEGATIVE

## 2018-06-16 ENCOUNTER — Ambulatory Visit: Payer: Medicare Other | Admitting: Podiatry

## 2018-06-16 ENCOUNTER — Ambulatory Visit: Payer: Medicare Other | Admitting: Orthotics

## 2018-06-16 ENCOUNTER — Ambulatory Visit (INDEPENDENT_AMBULATORY_CARE_PROVIDER_SITE_OTHER): Payer: Medicare Other | Admitting: Podiatry

## 2018-06-16 DIAGNOSIS — E1149 Type 2 diabetes mellitus with other diabetic neurological complication: Secondary | ICD-10-CM | POA: Diagnosis not present

## 2018-06-16 DIAGNOSIS — M21619 Bunion of unspecified foot: Secondary | ICD-10-CM

## 2018-06-16 DIAGNOSIS — L600 Ingrowing nail: Secondary | ICD-10-CM | POA: Diagnosis not present

## 2018-06-16 NOTE — Progress Notes (Signed)

## 2018-06-19 ENCOUNTER — Ambulatory Visit (INDEPENDENT_AMBULATORY_CARE_PROVIDER_SITE_OTHER): Payer: Medicare Other | Admitting: Orthotics

## 2018-06-19 DIAGNOSIS — M2042 Other hammer toe(s) (acquired), left foot: Secondary | ICD-10-CM

## 2018-06-19 DIAGNOSIS — M2041 Other hammer toe(s) (acquired), right foot: Secondary | ICD-10-CM

## 2018-06-19 DIAGNOSIS — M21619 Bunion of unspecified foot: Secondary | ICD-10-CM

## 2018-06-19 DIAGNOSIS — E1149 Type 2 diabetes mellitus with other diabetic neurological complication: Secondary | ICD-10-CM

## 2018-06-19 DIAGNOSIS — L600 Ingrowing nail: Secondary | ICD-10-CM

## 2018-06-19 NOTE — Progress Notes (Signed)

## 2018-06-19 NOTE — Progress Notes (Signed)
Subjective: 67 year old female presents the office today for concerns of a possible ingrown toenail to her left big toe which is been ongoing for about 6 months.  Repeat blood pressure in shoes but denies any redness or drainage or any swelling.  She states that she did physical therapy and she no longer has any burning or tingling to her feet she is very happy with this.  Is also continue with gabapentin. Denies any systemic complaints such as fevers, chills, nausea, vomiting. No acute changes since last appointment, and no other complaints at this time.   Last A1c was 10.9  Objective: AAO x3, NAD DP/PT pulses palpable bilaterally, CRT less than 3 seconds Protective sensation decreased with Simms Weinstein monofilament There is incurvation to the medial aspect left hallux toenail with tenderness palpation but there is no edema, erythema, drainage or pus there is no clinical signs of infection noted today.  The nail itself is mildly hypertrophic, dystrophic with yellow to brown discoloration. No open lesions or pre-ulcerative lesions.  No pain with calf compression, swelling, warmth, erythema  Assessment: Ingrown toenail left medial hallux  Plan: -All treatment options discussed with the patient including all alternatives, risks, complications.  -Given her elevated A1c I recommend holding off on a partial nail avulsion.  I do not causing the wound.  I did sharply debride the symptomatic portion of ingrown toenail with any complications or bleeding and after debridement there is resolution of pain.  Once blood sugar comes down we can do a partial nail avulsion with chemical matricectomy if needed monitoring signs or symptoms of infection in the meantime. -Patient encouraged to call the office with any questions, concerns, change in symptoms.   Trula Slade DPM

## 2018-06-20 ENCOUNTER — Encounter (HOSPITAL_COMMUNITY): Payer: Self-pay

## 2018-06-20 ENCOUNTER — Other Ambulatory Visit: Payer: Self-pay

## 2018-06-20 ENCOUNTER — Emergency Department (HOSPITAL_COMMUNITY)
Admission: EM | Admit: 2018-06-20 | Discharge: 2018-06-20 | Disposition: A | Discharge status: Home / Self Care | Payer: Medicare Other | Attending: Emergency Medicine | Admitting: Emergency Medicine

## 2018-06-20 DIAGNOSIS — J45909 Unspecified asthma, uncomplicated: Secondary | ICD-10-CM | POA: Insufficient documentation

## 2018-06-20 DIAGNOSIS — H60502 Unspecified acute noninfective otitis externa, left ear: Secondary | ICD-10-CM

## 2018-06-20 DIAGNOSIS — I10 Essential (primary) hypertension: Secondary | ICD-10-CM | POA: Diagnosis not present

## 2018-06-20 DIAGNOSIS — H9202 Otalgia, left ear: Secondary | ICD-10-CM | POA: Diagnosis present

## 2018-06-20 DIAGNOSIS — E119 Type 2 diabetes mellitus without complications: Secondary | ICD-10-CM | POA: Diagnosis not present

## 2018-06-20 DIAGNOSIS — Z7902 Long term (current) use of antithrombotics/antiplatelets: Secondary | ICD-10-CM | POA: Diagnosis not present

## 2018-06-20 DIAGNOSIS — Z96652 Presence of left artificial knee joint: Secondary | ICD-10-CM | POA: Diagnosis not present

## 2018-06-20 DIAGNOSIS — Z79899 Other long term (current) drug therapy: Secondary | ICD-10-CM | POA: Diagnosis not present

## 2018-06-20 DIAGNOSIS — Z794 Long term (current) use of insulin: Secondary | ICD-10-CM | POA: Insufficient documentation

## 2018-06-20 MED ORDER — CIPROFLOXACIN-DEXAMETHASONE 0.3-0.1 % OT SUSP: 4.0000 [drp] | Freq: Two times a day (BID) | OTIC | 0 refills | Status: DC

## 2018-06-20 MED ORDER — HYDROCODONE-ACETAMINOPHEN 5-325 MG PO TABS
1.0000 | ORAL_TABLET | Freq: Once | ORAL | Status: AC
  Administered 2018-06-20: 1 via ORAL
  Filled 2018-06-20: qty 1

## 2018-06-20 MED ORDER — TRAMADOL HCL 50 MG PO TABS: 50.0000 mg | ORAL_TABLET | Freq: Four times a day (QID) | ORAL | 0 refills | Status: DC | PRN

## 2018-06-20 NOTE — ED Provider Notes (Signed)
Patient placed in Quick Look pathway, seen and evaluated   Chief Complaint: left ear pain  HPI: [name] is a 67 y.o. female who presents to the ED with left ear pain that started 4 days ago. Patient reports the pain is sharp and radiates to the left side of the face.   ROS: HENT: ear pain, facial pain  Physical Exam:  BP (!) 151/74 (BP Location: Right Arm)   Pulse 73   Temp 98.3 F (36.8 C) (Oral)   Resp 18   LMP  (LMP Unknown)   SpO2 100%    Gen: No distress  Neuro: Awake and Alert  Skin: Warm and dry  ENT: left ear tender on exam   Initiation of care has begun. The patient has been counseled on the process, plan, and necessity for staying for the completion/evaluation, and the remainder of the medical screening examination    Ashley Murrain, NP 06/20/18 2006    Virgel Manifold, MD 06/22/18 2352

## 2018-06-20 NOTE — ED Triage Notes (Signed)
Pt presents for evaluation of L sided face/ear pain x 2-3 days. Reports L ear is sensitive to air, has cotton in ear. States Sharp pain starting above L ear and radiates to jaw.

## 2018-06-20 NOTE — ED Provider Notes (Signed)
[facility] EMERGENCY DEPARTMENT Provider Note   CSN: [id number] Arrival date & time: 06/20/18  1244     History   Chief Complaint Chief Complaint  Patient presents with  . Facial Pain  . Otalgia    HPI [name] is a 67 y.o. female who presents with left ear pain. PMH significant for HTN, asthma, Type 2 DM, HTN, HLD. She states that her ear pain started about a week ago. It has gradually worsened. It hurts to touch. The pain radiates to her head. She has been putting "sweet oil" in her ear. No fever, URI symptoms, cough, headache, vision changes, dizziness, neck pain, dental pain, ear drainage. She had not put anything else in her ear. No hearing loss. She has not been swimming.  HPI  Past Medical History:  Diagnosis Date  . Arthritis    L knee, hands, back   . Asthma   . Diabetes mellitus type 2, insulin dependent ([place])   . Fibromyalgia   . Full dentures   . GERD (gastroesophageal reflux disease)   . H/O hiatal hernia   . Headache    h/o migraines - was followed for a time with a wellness doctor  . History of esophageal dilatation   . History of rhabdomyolysis    03/ 2015  . Hyperlipidemia   . Hypertension   . Insulin pump in place    since 12/ 2014--  [employer]  . Osteoarthritis of left knee, primary localized 08/17/2014  . Renal insufficiency   . SUI (stress urinary incontinence, female)   . Wears glasses     Patient Active Problem List   Diagnosis Date Noted  . Localized swelling, mass and lump, multiple sites 03/27/2018  . Type II diabetes mellitus with renal manifestations ([place]) 12/27/2017  . HLD (hyperlipidemia) 12/27/2017  . HTN (hypertension) 12/27/2017  . Acute renal failure superimposed on stage 3 chronic kidney disease ([place]) 12/27/2017  . Syncope 12/27/2017  . Cough variant asthma 12/01/2015  . Osteoarthritis of left knee, primary localized 08/17/2014  . Knee osteoarthritis 08/17/2014  . Insulin pump in place  12/21/2013  . Rhabdomyolysis 11/15/2013  . Elevated lactic acid level 11/15/2013  . Hypoglycemia 11/15/2013  . Other and unspecified hyperlipidemia 08/06/2013  . Type II diabetes mellitus, uncontrolled ([place]) 07/20/2013  . Varicose veins of lower extremities with other complications 42/59/5638  . Fibromyalgia   . GERD 05/18/2010  . URINARY TRACT INFECTION, MONILIAL 05/05/2010  . UTI 05/02/2010  . ABDOMINAL PAIN, GENERALIZED 05/02/2010  . OBESITY 04/20/2010  . BURSITIS, LEFT SHOULDER 03/09/2010  . CANDIDIASIS OF VULVA AND VAGINA 01/10/2010  . Lipoma of arm s/p excision 01/29/2014 01/10/2010  . DEPRESSION 12/27/2009  . BACK PAIN WITH RADICULOPATHY 12/27/2009  . CYSTITIS, ACUTE 10/21/2009  . MICROSCOPIC HEMATURIA 10/18/2009  . KNEE PAIN, BILATERAL 10/18/2009  . NEUROPATHY 09/08/2009  . HYPERTENSION, BENIGN ESSENTIAL 09/08/2009  . Asthma 09/08/2009  . STRESS INCONTINENCE 09/08/2009  . CHEST PAIN 07/19/2009  . ESOPHAGEAL STRICTURE 08/27/2005  . Diabetes mellitus without complication ([place]) 75/64/3329    Past Surgical History:  Procedure Laterality Date  . BLADDER SUSPENSION N/A 03/09/2014   Procedure: CYSTOSCOPY/SLING;  Surgeon: Reece Packer, MD;  Location[facility];  Service: Urology;  Laterality: N/A;  . CARDIAC CATHETERIZATION  07-20-2009   DR Shelva Majestic   MODERATE LVH/  NORMAL  LVEF/  NORMAL CORONARY AND RENAL ARTERIES  . CARPAL TUNNEL RELEASE Right 2000  . CHOLECYSTECTOMY  1990  . COLONOSCOPY  WITH ESOPHAGOGASTRODUODENOSCOPY (EGD)  06-18-2002  . ESOPHAGOGASTRODUODENOSCOPY (EGD) WITH ESOPHAGEAL DILATION  06-12-2010  . EXCISION LEFT UPPER ARM MASS  01-29-2014  . EYE SURGERY  2010   laser on R eye  . KNEE ARTHROSCOPY Right 1989  &  2002  . NEGATIVE SLEEP STUDY  2012   PER PT  . PARTIAL KNEE ARTHROPLASTY Left 08/17/2014   Procedure: LEFT UNICOMPARTMENTAL KNEE;  Surgeon: Johnny Bridge, MD;  Location: [facility];  Service: Orthopedics;  Laterality: Left;      OB History    Gravida  1   Para  1   Term  1   Preterm      AB      Living  1     SAB      TAB      Ectopic      Multiple      Live Births  1            Home Medications    Prior to Admission medications   Medication Sig Start Date End Date Taking? Authorizing Provider  albuterol (PROAIR HFA) 108 (90 Base) MCG/ACT inhaler 2 puffs every 4 hours as needed only  if your can't catch your breath Patient taking differently: Inhale 1-2 puffs into the lungs every 4 (four) hours as needed for wheezing.  01/20/16   Tanda Rockers, MD  amLODipine (NORVASC) 10 MG tablet Take 10 mg by mouth every evening.     [provider]  bacitracin ointment Apply topically 2 (two) times daily. Patient not taking: Reported on 06/09/2018 12/27/17   Patrecia Pour, MD  BAYER CONTOUR NEXT TEST test strip use AS INSTRUCTED TO CHECK BLOOD SUGARS 7 TIMES A DAY 06/13/15   Elayne Snare, MD  BAYER MICROLET LANCETS lancets Use as instructed to check blood sugar 7 times per day dx code E11.65 06/13/15   Elayne Snare, MD  docusate sodium (COLACE) 100 MG capsule Take 100 mg by mouth 2 (two) times daily as needed for mild constipation.    [provider]  FLUAD 0.5 ML SUSY ADM 0.5ML IM UTD 04/22/18   [provider]  gabapentin (NEURONTIN) 300 MG capsule Take 1 capsule (300 mg total) by mouth 3 (three) times daily. 06/09/18   [name], FNP  glucagon (GLUCAGON EMERGENCY) 1 MG injection Inject 1 mg into the vein once as needed (for low blood sugar). Patient not taking: Reported on 06/09/2018 09/30/14   Elayne Snare, MD  hydrOXYzine (ATARAX/VISTARIL) 25 MG tablet Take 25 mg by mouth every 4 (four) hours as needed for anxiety or itching.  01/20/15   [provider]  insulin aspart (NOVOLOG) 100 UNIT/ML injection Inject into the skin 3 (three) times daily before meals. Insulin pump, it gives the pt her insulin dose based off of how much carbohydrates the pt has consumed     [provider]  insulin lispro (HUMALOG) 100 UNIT/ML injection INJECT MAX OF 90 UNITS SUBCUTANEOUSLY EVERY DAY WITH INSULIN PUMP 06/09/18   [name], FNP  INVOKANA 300 MG TABS tablet take 1 tablet by mouth once daily BEFORE BREAKFAST 07/23/16   Elayne Snare, MD  losartan (COZAAR) 50 MG tablet Take 1 tablet (50 mg total) by mouth daily. 11/11/15   Elayne Snare, MD  losartan-hydrochlorothiazide [facility]) 100-25 MG tablet take 1 tablet by mouth once daily 03/17/15   [provider]  metFORMIN (GLUCOPHAGE-XR) 500 MG 24 hr tablet Take 3 tablets (1,500 mg total) by mouth daily with  supper. Patient not taking: Reported on 06/09/2018 09/01/15   Elayne Snare, MD  metoCLOPramide (REGLAN) 5 MG tablet  03/23/15   [provider]  mometasone-formoterol (DULERA) 100-5 MCG/ACT AERO Take 2 puffs first thing in am and then another 2 puffs about 12 hours later. Patient taking differently: Inhale 2 puffs into the lungs 2 (two) times daily.  01/20/16   Tanda Rockers, MD  Nebivolol HCl (BYSTOLIC) 20 MG TABS Take 1 tablet (20 mg total) by mouth daily. 06/09/18   [name], FNP  nystatin (MYCOSTATIN/NYSTOP) powder Apply topically. 12/08/17   [provider]  oxybutynin (DITROPAN-XL) 10 MG 24 hr tablet Take 10 mg by mouth at bedtime.  10/27/15   [provider]  pantoprazole (PROTONIX) 40 MG tablet Take 30- 60 min before your first and last meals of the day Patient taking differently: Take 40 mg by mouth daily.  12/01/15   Tanda Rockers, MD  pravastatin (PRAVACHOL) 80 MG tablet Take 1 tablet (80 mg total) by mouth every morning. Patient taking differently: Take 80 mg by mouth daily.  01/21/15   Elayne Snare, MD  Semaglutide,0.25 or 0.5MG /DOS, (OZEMPIC, 0.25 OR 0.5 MG/DOSE,) 2 MG/1.5ML SOPN Inject 0.5 mg into the skin once a week. Inject 0.5mg  by subcutaneous route on the same day of each week in the abdomen, thighs or upper arm. 06/09/18   [name], FNP  spironolactone  (ALDACTONE) 50 MG tablet Take 1 tablet (50 mg total) by mouth daily. 06/09/18   [name], FNP  sucralfate (CARAFATE) 1 g tablet Take 1 g by mouth 4 (four) times daily.    [provider]  VICTOZA 18 MG/3ML SOPN inject 1.2 milligram subcutaneously daily Patient not taking: Reported on 06/09/2018 07/23/16   Elayne Snare, MD  Vitamin D, Ergocalciferol, (DRISDOL) 50000 units CAPS capsule Take 1 capsule (50,000 Units total) by mouth every 7 (seven) days. On Wednesday 06/09/18   [name], FNP    Family History Family History  Problem Relation Age of Onset  . Diabetes Mother   . Hypertension Mother   . Lung cancer Mother        smoked  . Diabetes Father   . Hypertension Father   . Lung cancer Father        smoked  . Diabetes Brother   . Heart failure Sister   . Diabetes Maternal Grandmother   . Cancer Maternal Grandmother     Social History Social History   Tobacco Use  . Smoking status: Never Smoker  . Smokeless tobacco: Never Used  Substance Use Topics  . Alcohol use: No  . Drug use: No     Allergies   Pollen extract; Tomato; and Codeine   Review of Systems Review of Systems  Constitutional: Negative for fever.  HENT: Positive for ear pain. Negative for ear discharge and sore throat.   Eyes: Negative for visual disturbance.  Neurological: Negative for dizziness and headaches.  All other systems reviewed and are negative.    Physical Exam Updated Vital Signs BP (!) 151/74 (BP Location: Right Arm)   Pulse 73   Temp 98.3 F (36.8 C) (Oral)   Resp 18   LMP  (LMP Unknown)   SpO2 100%   Physical Exam  Constitutional: She is oriented to person, place, and time. She appears well-developed and well-nourished. No distress.  Uncomfortable appearing  HENT:  Head: Normocephalic and atraumatic.  Right Ear: Hearing, tympanic membrane, external ear and ear canal normal.  Left Ear: Hearing,  tympanic membrane and external ear normal. There is swelling and  tenderness. No drainage. Tympanic membrane is not erythematous.  Exquisite tenderness of ear with light palpation  Eyes: Pupils are equal, round, and reactive to light. Conjunctivae are normal. Right eye exhibits no discharge. Left eye exhibits no discharge. No scleral icterus.  Neck: Normal range of motion.  Cardiovascular: Normal rate.  Pulmonary/Chest: Effort normal. No respiratory distress.  Abdominal: She exhibits no distension.  Neurological: She is alert and oriented to person, place, and time.  Skin: Skin is warm and dry.  Psychiatric: She has a normal mood and affect. Her behavior is normal.  Nursing note and vitals reviewed.    ED Treatments / Results  Labs (all labs ordered are listed, but only abnormal results are displayed) Labs Reviewed - No data to display  EKG None  Radiology No results found.  Procedures Procedures (including critical care time)  Medications Ordered in ED Medications - No data to display   Initial Impression / Assessment and Plan / ED Course  I have reviewed the triage vital signs and the nursing notes.  Pertinent labs & imaging results that were available during my care of the patient were reviewed by me and considered in my medical decision making (see chart for details).  67 year old female presents with left ear pain for one week. No other symptoms. She is hypertensive likely due to pain. This has improved on recheck. She has a mildly swollen ear canal consistent with otitis externa. TM is not erythematous. No severe headache, dizziness, vision changes to suggest neurologic etiology or giant cell arteritis and symptoms have been going on for one week. Will rx ear drops, pain medicine, and advised PCP follow up. Return precautions given.  Final Clinical Impressions(s) / ED Diagnoses   Final diagnoses:  Acute otitis externa of left ear, unspecified type    ED Discharge Orders    None       Recardo Evangelist, PA-C 06/20/18  2047    Nat Christen, MD 06/21/18 [phone]

## 2018-06-20 NOTE — Discharge Instructions (Signed)
Please use antibiotic ear drop as directed Take Tramadol as needed Use a heating pad on the affected ear

## 2018-06-20 NOTE — ED Notes (Signed)
Pt stable and ambulatory for discharge, states understanding follow up on blood pressure

## 2018-07-08 ENCOUNTER — Ambulatory Visit
Admission: RE | Admit: 2018-07-08 | Discharge: 2018-07-08 | Disposition: A | Discharge status: Home / Self Care | Payer: Medicare Other | Source: Ambulatory Visit | Attending: Internal Medicine | Admitting: Internal Medicine

## 2018-07-08 DIAGNOSIS — Z1231 Encounter for screening mammogram for malignant neoplasm of breast: Secondary | ICD-10-CM

## 2018-07-12 ENCOUNTER — Other Ambulatory Visit: Payer: Self-pay | Admitting: Nurse Practitioner

## 2018-07-15 ENCOUNTER — Encounter (HOSPITAL_COMMUNITY): Payer: Self-pay

## 2018-07-15 ENCOUNTER — Other Ambulatory Visit: Payer: Self-pay

## 2018-07-15 DIAGNOSIS — W228XXA Striking against or struck by other objects, initial encounter: Secondary | ICD-10-CM | POA: Insufficient documentation

## 2018-07-15 DIAGNOSIS — E1122 Type 2 diabetes mellitus with diabetic chronic kidney disease: Secondary | ICD-10-CM | POA: Diagnosis not present

## 2018-07-15 DIAGNOSIS — Z79899 Other long term (current) drug therapy: Secondary | ICD-10-CM | POA: Insufficient documentation

## 2018-07-15 DIAGNOSIS — S99911A Unspecified injury of right ankle, initial encounter: Secondary | ICD-10-CM | POA: Diagnosis not present

## 2018-07-15 DIAGNOSIS — Y999 Unspecified external cause status: Secondary | ICD-10-CM | POA: Insufficient documentation

## 2018-07-15 DIAGNOSIS — J45909 Unspecified asthma, uncomplicated: Secondary | ICD-10-CM | POA: Insufficient documentation

## 2018-07-15 DIAGNOSIS — M79671 Pain in right foot: Secondary | ICD-10-CM | POA: Diagnosis not present

## 2018-07-15 DIAGNOSIS — W182XXA Fall in (into) shower or empty bathtub, initial encounter: Secondary | ICD-10-CM | POA: Insufficient documentation

## 2018-07-15 DIAGNOSIS — Z794 Long term (current) use of insulin: Secondary | ICD-10-CM | POA: Diagnosis not present

## 2018-07-15 DIAGNOSIS — I129 Hypertensive chronic kidney disease with stage 1 through stage 4 chronic kidney disease, or unspecified chronic kidney disease: Secondary | ICD-10-CM | POA: Insufficient documentation

## 2018-07-15 DIAGNOSIS — Z96652 Presence of left artificial knee joint: Secondary | ICD-10-CM | POA: Diagnosis not present

## 2018-07-15 DIAGNOSIS — S9031XA Contusion of right foot, initial encounter: Secondary | ICD-10-CM | POA: Diagnosis not present

## 2018-07-15 DIAGNOSIS — N183 Chronic kidney disease, stage 3 (moderate): Secondary | ICD-10-CM | POA: Insufficient documentation

## 2018-07-15 DIAGNOSIS — Y92002 Bathroom of unspecified non-institutional (private) residence single-family (private) house as the place of occurrence of the external cause: Secondary | ICD-10-CM | POA: Insufficient documentation

## 2018-07-15 DIAGNOSIS — Y93E1 Activity, personal bathing and showering: Secondary | ICD-10-CM | POA: Diagnosis not present

## 2018-07-15 DIAGNOSIS — S99921A Unspecified injury of right foot, initial encounter: Secondary | ICD-10-CM | POA: Diagnosis not present

## 2018-07-15 NOTE — ED Triage Notes (Addendum)
Pt reports that she hit her R ankle on the tub on Saturday night. She states that she has been using ace wraps and elevating it, but the pain continues to worsen, especially with weight bearing. A&Ox4. Hx HTN and states that she thinks that it is due to pain.

## 2018-07-16 ENCOUNTER — Emergency Department (HOSPITAL_COMMUNITY): Payer: Medicare Other

## 2018-07-16 ENCOUNTER — Emergency Department (HOSPITAL_COMMUNITY)
Admission: EM | Admit: 2018-07-16 | Discharge: 2018-07-16 | Disposition: A | Discharge status: Home / Self Care | Payer: Medicare Other | Attending: Emergency Medicine | Admitting: Emergency Medicine

## 2018-07-16 DIAGNOSIS — S99921A Unspecified injury of right foot, initial encounter: Secondary | ICD-10-CM | POA: Diagnosis not present

## 2018-07-16 DIAGNOSIS — S9031XA Contusion of right foot, initial encounter: Secondary | ICD-10-CM

## 2018-07-16 DIAGNOSIS — S99911A Unspecified injury of right ankle, initial encounter: Secondary | ICD-10-CM | POA: Diagnosis not present

## 2018-07-16 DIAGNOSIS — M79671 Pain in right foot: Secondary | ICD-10-CM | POA: Diagnosis not present

## 2018-07-16 MED ORDER — HYDROCODONE-ACETAMINOPHEN 5-325 MG PO TABS
1.0000 | ORAL_TABLET | Freq: Once | ORAL | Status: AC
  Administered 2018-07-16: 1 via ORAL
  Filled 2018-07-16: qty 1

## 2018-07-16 MED ORDER — ACETAMINOPHEN 500 MG PO TABS: 500.0000 mg | ORAL_TABLET | Freq: Four times a day (QID) | ORAL | 0 refills | Status: DC | PRN

## 2018-07-16 NOTE — Discharge Instructions (Addendum)
You have no evidence of broken bones.  You likely have bruising to the foot.  Keep iced and elevated.  Tylenol as needed for pain.

## 2018-07-16 NOTE — ED Provider Notes (Signed)
[facility] DEPT Provider Note   CSN: [id number] Arrival date & time: 07/15/18  2326     History   Chief Complaint Chief Complaint  Patient presents with  . Ankle Pain    HPI [name] is a 67 y.o. female.  HPI  This is a 67 year old female who presents with right foot and ankle pain.  Patient reports that on Saturday she was getting out of shower when she hit her right foot and ankle on the tub.  Since that time she has had increasing pain.  She rates her pain at 9 out of 10.  It is worse with ambulation.  She has taken aspirin with minimal relief.  Denies that she fell or hit her head.  Denies other injury.  Denies numbness or tingling.  Past Medical History:  Diagnosis Date  . Arthritis    L knee, hands, back   . Asthma   . Diabetes mellitus type 2, insulin dependent ([place])   . Fibromyalgia   . Full dentures   . GERD (gastroesophageal reflux disease)   . H/O hiatal hernia   . Headache    h/o migraines - was followed for a time with a wellness doctor  . History of esophageal dilatation   . History of rhabdomyolysis    03/ 2015  . Hyperlipidemia   . Hypertension   . Insulin pump in place    since 12/ 2014--  [employer]  . Osteoarthritis of left knee, primary localized 08/17/2014  . Renal insufficiency   . SUI (stress urinary incontinence, female)   . Wears glasses     Patient Active Problem List   Diagnosis Date Noted  . Localized swelling, mass and lump, multiple sites 03/27/2018  . Type II diabetes mellitus with renal manifestations ([place]) 12/27/2017  . HLD (hyperlipidemia) 12/27/2017  . HTN (hypertension) 12/27/2017  . Acute renal failure superimposed on stage 3 chronic kidney disease ([place]) 12/27/2017  . Syncope 12/27/2017  . Cough variant asthma 12/01/2015  . Osteoarthritis of left knee, primary localized 08/17/2014  . Knee osteoarthritis 08/17/2014  . Insulin pump in place 12/21/2013  . Rhabdomyolysis 11/15/2013    . Elevated lactic acid level 11/15/2013  . Hypoglycemia 11/15/2013  . Other and unspecified hyperlipidemia 08/06/2013  . Type II diabetes mellitus, uncontrolled ([place]) 07/20/2013  . Varicose veins of lower extremities with other complications 10/62/6948  . Fibromyalgia   . GERD 05/18/2010  . URINARY TRACT INFECTION, MONILIAL 05/05/2010  . UTI 05/02/2010  . ABDOMINAL PAIN, GENERALIZED 05/02/2010  . OBESITY 04/20/2010  . BURSITIS, LEFT SHOULDER 03/09/2010  . CANDIDIASIS OF VULVA AND VAGINA 01/10/2010  . Lipoma of arm s/p excision 01/29/2014 01/10/2010  . DEPRESSION 12/27/2009  . BACK PAIN WITH RADICULOPATHY 12/27/2009  . CYSTITIS, ACUTE 10/21/2009  . MICROSCOPIC HEMATURIA 10/18/2009  . KNEE PAIN, BILATERAL 10/18/2009  . NEUROPATHY 09/08/2009  . HYPERTENSION, BENIGN ESSENTIAL 09/08/2009  . Asthma 09/08/2009  . STRESS INCONTINENCE 09/08/2009  . CHEST PAIN 07/19/2009  . ESOPHAGEAL STRICTURE 08/27/2005  . Diabetes mellitus without complication ([place]) 54/62/7035    Past Surgical History:  Procedure Laterality Date  . BLADDER SUSPENSION N/A 03/09/2014   Procedure: CYSTOSCOPY/SLING;  Surgeon: Reece Packer, MD;  Location: [facility];  Service: Urology;  Laterality: N/A;  . CARDIAC CATHETERIZATION  07-20-2009   DR Shelva Majestic   MODERATE LVH/  NORMAL  LVEF/  NORMAL CORONARY AND RENAL ARTERIES  . CARPAL TUNNEL RELEASE Right 2000  . CHOLECYSTECTOMY  1990  .  COLONOSCOPY WITH ESOPHAGOGASTRODUODENOSCOPY (EGD)  06-18-2002  . ESOPHAGOGASTRODUODENOSCOPY (EGD) WITH ESOPHAGEAL DILATION  06-12-2010  . EXCISION LEFT UPPER ARM MASS  01-29-2014  . EYE SURGERY  2010   laser on R eye  . KNEE ARTHROSCOPY Right 1989  &  2002  . NEGATIVE SLEEP STUDY  2012   PER PT  . PARTIAL KNEE ARTHROPLASTY Left 08/17/2014   Procedure: LEFT UNICOMPARTMENTAL KNEE;  Surgeon: Johnny Bridge, MD;  Location: [facility];  Service: Orthopedics;  Laterality: Left;     OB History    Gravida  1   Para  1    Term  1   Preterm      AB      Living  1     SAB      TAB      Ectopic      Multiple      Live Births  1            Home Medications    Prior to Admission medications   Medication Sig Start Date End Date Taking? Authorizing Provider  acetaminophen (TYLENOL) 500 MG tablet Take 1 tablet (500 mg total) by mouth every 6 (six) hours as needed. 07/16/18   Kimika Streater, Barbette Hair, MD  albuterol (PROAIR HFA) 108 (90 Base) MCG/ACT inhaler 2 puffs every 4 hours as needed only  if your can't catch your breath Patient taking differently: Inhale 1-2 puffs into the lungs every 4 (four) hours as needed for wheezing.  01/20/16   Tanda Rockers, MD  amLODipine (NORVASC) 10 MG tablet Take 10 mg by mouth every evening.     [provider]  bacitracin ointment Apply topically 2 (two) times daily. Patient not taking: Reported on 06/09/2018 12/27/17   Patrecia Pour, MD  BAYER CONTOUR NEXT TEST test strip use AS INSTRUCTED TO CHECK BLOOD SUGARS 7 TIMES A DAY 06/13/15   Elayne Snare, MD  BAYER MICROLET LANCETS lancets Use as instructed to check blood sugar 7 times per day dx code E11.65 06/13/15   Elayne Snare, MD  ciprofloxacin-dexamethasone (CIPRODEX) OTIC suspension Place 4 drops into the left ear 2 (two) times daily. 06/20/18   Recardo Evangelist, PA-C  docusate sodium (COLACE) 100 MG capsule Take 100 mg by mouth 2 (two) times daily as needed for mild constipation.    [provider]  FLUAD 0.5 ML SUSY ADM 0.5ML IM UTD 04/22/18   [provider]  gabapentin (NEURONTIN) 300 MG capsule Take 1 capsule (300 mg total) by mouth 3 (three) times daily. 06/09/18   [name], FNP  glucagon (GLUCAGON EMERGENCY) 1 MG injection Inject 1 mg into the vein once as needed (for low blood sugar). Patient not taking: Reported on 06/09/2018 09/30/14   Elayne Snare, MD  hydrOXYzine (ATARAX/VISTARIL) 25 MG tablet Take 25 mg by mouth every 4 (four) hours as needed for anxiety or itching.  01/20/15    [provider]  insulin aspart (NOVOLOG) 100 UNIT/ML injection Inject into the skin 3 (three) times daily before meals. Insulin pump, it gives the pt her insulin dose based off of how much carbohydrates the pt has consumed    [provider]  insulin lispro (HUMALOG) 100 UNIT/ML injection INJECT MAX OF 90 UNITS SUBCUTANEOUSLY EVERY DAY WITH INSULIN PUMP 06/09/18   [name], FNP  INVOKANA 300 MG TABS tablet take 1 tablet by mouth once daily BEFORE BREAKFAST 07/23/16   Elayne Snare, MD  losartan (COZAAR) 50 MG tablet Take  1 tablet (50 mg total) by mouth daily. 11/11/15   Elayne Snare, MD  losartan-hydrochlorothiazide [facility]) 100-25 MG tablet take 1 tablet by mouth once daily 03/17/15   [provider]  metFORMIN (GLUCOPHAGE-XR) 500 MG 24 hr tablet Take 3 tablets (1,500 mg total) by mouth daily with supper. Patient not taking: Reported on 06/09/2018 09/01/15   Elayne Snare, MD  metoCLOPramide (REGLAN) 5 MG tablet  03/23/15   [provider]  mometasone-formoterol (DULERA) 100-5 MCG/ACT AERO Take 2 puffs first thing in am and then another 2 puffs about 12 hours later. Patient taking differently: Inhale 2 puffs into the lungs 2 (two) times daily.  01/20/16   Tanda Rockers, MD  Nebivolol HCl (BYSTOLIC) 20 MG TABS Take 1 tablet (20 mg total) by mouth daily. 06/09/18   [name], FNP  nystatin (MYCOSTATIN/NYSTOP) powder Apply topically. 12/08/17   [provider]  oxybutynin (DITROPAN-XL) 10 MG 24 hr tablet Take 10 mg by mouth at bedtime.  10/27/15   [provider]  pantoprazole (PROTONIX) 40 MG tablet Take 30- 60 min before your first and last meals of the day Patient taking differently: Take 40 mg by mouth daily.  12/01/15   Tanda Rockers, MD  pravastatin (PRAVACHOL) 80 MG tablet Take 1 tablet (80 mg total) by mouth every morning. Patient taking differently: Take 80 mg by mouth daily.  01/21/15   Elayne Snare, MD  Semaglutide,0.25 or 0.5MG /DOS,  (OZEMPIC, 0.25 OR 0.5 MG/DOSE,) 2 MG/1.5ML SOPN Inject 0.5 mg into the skin once a week. Inject 0.5mg  by subcutaneous route on the same day of each week in the abdomen, thighs or upper arm. 06/09/18   [name], FNP  spironolactone (ALDACTONE) 50 MG tablet Take 1 tablet (50 mg total) by mouth daily. 06/09/18   [name], FNP  sucralfate (CARAFATE) 1 g tablet Take 1 g by mouth 4 (four) times daily.    [provider]  traMADol (ULTRAM) 50 MG tablet Take 1 tablet (50 mg total) by mouth every 6 (six) hours as needed. 06/20/18   Recardo Evangelist, PA-C  VICTOZA 18 MG/3ML SOPN inject 1.2 milligram subcutaneously daily Patient not taking: Reported on 06/09/2018 07/23/16   Elayne Snare, MD  Vitamin D, Ergocalciferol, (DRISDOL) 1.25 MG (50000 UT) CAPS capsule TAKE 1 CAPSULE BY MOUTH EVERY 7 DAYS ON [facility] 07/14/18   [name], FNP    Family History Family History  Problem Relation Age of Onset  . Diabetes Mother   . Hypertension Mother   . Lung cancer Mother        smoked  . Diabetes Father   . Hypertension Father   . Lung cancer Father        smoked  . Diabetes Brother   . Heart failure Sister   . Diabetes Maternal Grandmother   . Cancer Maternal Grandmother     Social History Social History   Tobacco Use  . Smoking status: Never Smoker  . Smokeless tobacco: Never Used  Substance Use Topics  . Alcohol use: No  . Drug use: No     Allergies   Pollen extract; Tomato; and Codeine   Review of Systems Review of Systems  Musculoskeletal:       Foot and ankle pain  Skin: Negative for wound.  Neurological: Negative for weakness and numbness.  All other systems reviewed and are negative.    Physical Exam Updated Vital Signs BP (!) 153/87   Pulse 84   Temp 98.4 F (  36.9 C) (Oral)   Resp 16   Ht 1.626 m (5\' 4" )   Wt 76.2 kg   LMP  (LMP Unknown)   SpO2 99%   BMI 28.84 kg/m   Physical Exam  Constitutional: She is oriented to person, place, and  time. She appears well-developed and well-nourished.  HENT:  Head: Normocephalic and atraumatic.  Cardiovascular: Normal rate and regular rhythm.  Pulmonary/Chest: Effort normal. No respiratory distress.  Musculoskeletal:  Normal range of motion of the ankle, no significant swelling, tenderness to palpation over the medial aspect of the foot, no overlying skin changes, no knee tenderness  Neurological: She is alert and oriented to person, place, and time.  Skin: Skin is warm and dry.  Psychiatric: She has a normal mood and affect.  Nursing note and vitals reviewed.    ED Treatments / Results  Labs (all labs ordered are listed, but only abnormal results are displayed) Labs Reviewed - No data to display  EKG None  Radiology Dg Ankle Complete Right  Result Date: 07/16/2018 CLINICAL DATA:  Trauma 3 days ago. EXAM: RIGHT ANKLE - COMPLETE 3+ VIEW COMPARISON:  None. FINDINGS: There is no evidence of fracture, dislocation, or joint effusion. There is no evidence of arthropathy or other focal bone abnormality. Soft tissues are unremarkable. IMPRESSION: Negative. Electronically Signed   By: Dorise Bullion III M.D   On: 07/16/2018 00:14   Dg Foot Complete Right  Result Date: 07/16/2018 CLINICAL DATA:  Anterior foot pain after striking foot on the bathtub on 07/12/2018. EXAM: RIGHT FOOT COMPLETE - 3+ VIEW COMPARISON:  05/01/2018 FINDINGS: Degenerative changes in the first metatarsal-phalangeal joint. No evidence of acute fracture or dislocation. No focal bone lesion or bone destruction. Small plantar calcaneal spur. Soft tissues are unremarkable. IMPRESSION: No acute bony abnormalities.  Mild degenerative changes. Electronically Signed   By: Lucienne Capers M.D.   On: 07/16/2018 00:56    Procedures Procedures (including critical care time)  Medications Ordered in ED Medications  HYDROcodone-acetaminophen (NORCO/VICODIN) 5-325 MG per tablet 1 tablet (1 tablet Oral Given 07/16/18 0045)      Initial Impression / Assessment and Plan / ED Course  I have reviewed the triage vital signs and the nursing notes.  Pertinent labs & imaging results that were available during my care of the patient were reviewed by me and considered in my medical decision making (see chart for details).     Patient presents with foot and ankle pain.  She is overall nontoxic.  No obvious injury on exam.  She does have some tenderness.  X-rays are negative for fracture.  Suspect contusion.  Supportive treatment recommended.  After history, exam, and medical workup I feel the patient has been appropriately medically screened and is safe for discharge home. Pertinent diagnoses were discussed with the patient. Patient was given return precautions.   Final Clinical Impressions(s) / ED Diagnoses   Final diagnoses:  Contusion of right foot, initial encounter    ED Discharge Orders         Ordered    acetaminophen (TYLENOL) 500 MG tablet  Every 6 hours PRN     07/16/18 0146           Merryl Hacker, MD 07/16/18 [phone]

## 2018-07-16 NOTE — ED Notes (Signed)
Patient transported to X-ray 

## 2018-07-17 ENCOUNTER — Telehealth: Payer: Self-pay | Admitting: Endocrinology

## 2018-07-17 NOTE — Telephone Encounter (Signed)
Please advise if we have received this paperwork

## 2018-07-17 NOTE — Telephone Encounter (Signed)
Ronalee Belts with [employer] called to follow up on CMN they faxed over on 11/18.   [phone] Option 2

## 2018-07-18 NOTE — Telephone Encounter (Signed)
I will forward to [facility]

## 2018-07-21 ENCOUNTER — Other Ambulatory Visit: Payer: Self-pay | Admitting: Nurse Practitioner

## 2018-07-21 ENCOUNTER — Other Ambulatory Visit: Payer: Self-pay | Admitting: Endocrinology

## 2018-07-21 NOTE — Telephone Encounter (Signed)
Please f/u on CMN for this patient

## 2018-07-21 NOTE — Telephone Encounter (Signed)
Called  [employer] and CMN form is being faxed over to be filled out and then faxed back.

## 2018-07-21 NOTE — Telephone Encounter (Signed)
I do not have pt paper work

## 2018-07-22 ENCOUNTER — Telehealth: Payer: Self-pay | Admitting: Endocrinology

## 2018-07-22 NOTE — Telephone Encounter (Signed)
Patient has called and advised that she is no longer wanting to see Dr.Kumar and will be going to another provider for refills.

## 2018-07-22 NOTE — Telephone Encounter (Signed)
She has not been seen for over a year.  Please let her know that we cannot prescribe her insulin pump supplies unless she is seen regularly or be dismissed

## 2018-07-22 NOTE — Telephone Encounter (Signed)
Please be advised.  °

## 2018-07-22 NOTE — Telephone Encounter (Signed)
Called [employer] to inform them of MD decision and left voicemail with pt's caseworker with this information.

## 2018-07-22 NOTE — Telephone Encounter (Signed)
She will be considered self dismissed

## 2018-07-22 NOTE — Telephone Encounter (Signed)
Forms are with MD to be filled out and signed.

## 2018-07-22 NOTE — Telephone Encounter (Signed)
Called pt and left voicemail for her to call back in order to inform her of MD message.

## 2018-09-02 ENCOUNTER — Encounter: Payer: Self-pay | Admitting: Nurse Practitioner

## 2018-09-04 NOTE — Telephone Encounter (Signed)
Called patient to ask about the GYN referral reports she is having frequent yeast infections.  I explained elevated blood sugars can cause this, in the last 2 weeks she has not had one yet her blood sugars have been better. She is advised if occurs before her next appt to call office for an appt.  Also will likely need a PAP done at her next physical.

## 2018-09-05 DIAGNOSIS — N3020 Other chronic cystitis without hematuria: Secondary | ICD-10-CM | POA: Diagnosis not present

## 2018-10-01 ENCOUNTER — Emergency Department (HOSPITAL_COMMUNITY): Payer: Medicare Other

## 2018-10-01 ENCOUNTER — Other Ambulatory Visit: Payer: Self-pay

## 2018-10-01 ENCOUNTER — Encounter (HOSPITAL_COMMUNITY): Payer: Self-pay

## 2018-10-01 ENCOUNTER — Emergency Department (HOSPITAL_COMMUNITY)
Admission: EM | Admit: 2018-10-01 | Discharge: 2018-10-01 | Disposition: A | Discharge status: Home / Self Care | Payer: Medicare Other | Attending: Emergency Medicine | Admitting: Emergency Medicine

## 2018-10-01 DIAGNOSIS — S161XXA Strain of muscle, fascia and tendon at neck level, initial encounter: Secondary | ICD-10-CM

## 2018-10-01 DIAGNOSIS — Z79899 Other long term (current) drug therapy: Secondary | ICD-10-CM | POA: Diagnosis not present

## 2018-10-01 DIAGNOSIS — Y999 Unspecified external cause status: Secondary | ICD-10-CM | POA: Diagnosis not present

## 2018-10-01 DIAGNOSIS — S0990XA Unspecified injury of head, initial encounter: Secondary | ICD-10-CM | POA: Diagnosis not present

## 2018-10-01 DIAGNOSIS — Z794 Long term (current) use of insulin: Secondary | ICD-10-CM | POA: Diagnosis not present

## 2018-10-01 DIAGNOSIS — I129 Hypertensive chronic kidney disease with stage 1 through stage 4 chronic kidney disease, or unspecified chronic kidney disease: Secondary | ICD-10-CM | POA: Insufficient documentation

## 2018-10-01 DIAGNOSIS — Y9389 Activity, other specified: Secondary | ICD-10-CM | POA: Insufficient documentation

## 2018-10-01 DIAGNOSIS — S199XXA Unspecified injury of neck, initial encounter: Secondary | ICD-10-CM | POA: Diagnosis not present

## 2018-10-01 DIAGNOSIS — S299XXA Unspecified injury of thorax, initial encounter: Secondary | ICD-10-CM | POA: Diagnosis not present

## 2018-10-01 DIAGNOSIS — N183 Chronic kidney disease, stage 3 (moderate): Secondary | ICD-10-CM | POA: Insufficient documentation

## 2018-10-01 DIAGNOSIS — S3992XA Unspecified injury of lower back, initial encounter: Secondary | ICD-10-CM | POA: Diagnosis not present

## 2018-10-01 DIAGNOSIS — M545 Low back pain: Secondary | ICD-10-CM | POA: Diagnosis not present

## 2018-10-01 DIAGNOSIS — R51 Headache: Secondary | ICD-10-CM | POA: Diagnosis not present

## 2018-10-01 DIAGNOSIS — J45909 Unspecified asthma, uncomplicated: Secondary | ICD-10-CM | POA: Diagnosis not present

## 2018-10-01 DIAGNOSIS — S39012A Strain of muscle, fascia and tendon of lower back, initial encounter: Secondary | ICD-10-CM | POA: Diagnosis not present

## 2018-10-01 DIAGNOSIS — Z96652 Presence of left artificial knee joint: Secondary | ICD-10-CM | POA: Insufficient documentation

## 2018-10-01 DIAGNOSIS — M542 Cervicalgia: Secondary | ICD-10-CM | POA: Diagnosis not present

## 2018-10-01 DIAGNOSIS — Y92410 Unspecified street and highway as the place of occurrence of the external cause: Secondary | ICD-10-CM | POA: Diagnosis not present

## 2018-10-01 DIAGNOSIS — M25511 Pain in right shoulder: Secondary | ICD-10-CM | POA: Diagnosis not present

## 2018-10-01 DIAGNOSIS — I10 Essential (primary) hypertension: Secondary | ICD-10-CM | POA: Diagnosis not present

## 2018-10-01 DIAGNOSIS — M25519 Pain in unspecified shoulder: Secondary | ICD-10-CM | POA: Diagnosis not present

## 2018-10-01 DIAGNOSIS — S4991XA Unspecified injury of right shoulder and upper arm, initial encounter: Secondary | ICD-10-CM | POA: Diagnosis not present

## 2018-10-01 MED ORDER — HYDROCODONE-ACETAMINOPHEN 5-325 MG PO TABS: 1.0000 | ORAL_TABLET | ORAL | 0 refills | Status: DC | PRN

## 2018-10-01 MED ORDER — DIAZEPAM 5 MG PO TABS: 5.0000 mg | ORAL_TABLET | Freq: Two times a day (BID) | ORAL | 0 refills | Status: DC

## 2018-10-01 MED ORDER — IBUPROFEN 200 MG PO TABS
600.0000 mg | ORAL_TABLET | Freq: Once | ORAL | Status: AC
  Administered 2018-10-01: 600 mg via ORAL
  Filled 2018-10-01: qty 3

## 2018-10-01 MED ORDER — DIAZEPAM 5 MG PO TABS
5.0000 mg | ORAL_TABLET | Freq: Once | ORAL | Status: AC
  Administered 2018-10-01: 5 mg via ORAL
  Filled 2018-10-01: qty 1
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
## 2018-10-01 NOTE — ED Notes (Signed)
Pt states "my BP usually runs pretty high". Pt states that she has not taken her night time dose yet.
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
## 2018-10-03 ENCOUNTER — Encounter (HOSPITAL_COMMUNITY): Payer: Self-pay | Admitting: Emergency Medicine

## 2018-10-03 ENCOUNTER — Ambulatory Visit (HOSPITAL_COMMUNITY)
Admission: EM | Admit: 2018-10-03 | Discharge: 2018-10-03 | Disposition: A | Discharge status: Home / Self Care | Payer: Medicare Other | Attending: Family Medicine | Admitting: Family Medicine

## 2018-10-03 DIAGNOSIS — S161XXD Strain of muscle, fascia and tendon at neck level, subsequent encounter: Secondary | ICD-10-CM

## 2018-10-03 DIAGNOSIS — R51 Headache: Secondary | ICD-10-CM | POA: Diagnosis not present

## 2018-10-03 DIAGNOSIS — E119 Type 2 diabetes mellitus without complications: Secondary | ICD-10-CM | POA: Diagnosis not present

## 2018-10-03 MED ORDER — ONDANSETRON HCL 4 MG PO TABS: 4.0000 mg | ORAL_TABLET | Freq: Four times a day (QID) | ORAL | 0 refills | Status: DC

## 2018-10-03 MED ORDER — METHYLPREDNISOLONE ACETATE 80 MG/ML IJ SUSP
80.0000 mg | Freq: Once | INTRAMUSCULAR | Status: AC
  Administered 2018-10-03: 80 mg via INTRAMUSCULAR

## 2018-10-03 MED ORDER — METHYLPREDNISOLONE ACETATE 80 MG/ML IJ SUSP
INTRAMUSCULAR | Status: AC
  Filled 2018-10-03: qty 1
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
## 2018-10-03 NOTE — Discharge Instructions (Addendum)
Continue to rest Ice or heat to painful neck muscles Take Zofran (ondansetron) as needed for nausea and vomiting This will help you tolerate the pain medicine, if you needed Cut the diazepam (Valium) in half, take twice a day as needed for muscle relaxer Expect improvement over next few days
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
## 2018-10-08 ENCOUNTER — Ambulatory Visit: Payer: Self-pay | Admitting: Nurse Practitioner

## 2018-10-16 DIAGNOSIS — N3020 Other chronic cystitis without hematuria: Secondary | ICD-10-CM | POA: Diagnosis not present

## 2018-11-13 DIAGNOSIS — N3020 Other chronic cystitis without hematuria: Secondary | ICD-10-CM | POA: Diagnosis not present

## 2018-11-18 DIAGNOSIS — N390 Urinary tract infection, site not specified: Secondary | ICD-10-CM | POA: Diagnosis not present

## 2018-11-20 ENCOUNTER — Ambulatory Visit (INDEPENDENT_AMBULATORY_CARE_PROVIDER_SITE_OTHER): Payer: Medicare Other | Admitting: Nurse Practitioner

## 2018-11-20 ENCOUNTER — Ambulatory Visit: Payer: Self-pay

## 2018-11-20 ENCOUNTER — Ambulatory Visit: Payer: Self-pay | Admitting: Internal Medicine

## 2018-11-20 ENCOUNTER — Other Ambulatory Visit: Payer: Self-pay

## 2018-11-20 ENCOUNTER — Ambulatory Visit (INDEPENDENT_AMBULATORY_CARE_PROVIDER_SITE_OTHER): Payer: Medicare Other

## 2018-11-20 ENCOUNTER — Encounter: Payer: Self-pay | Admitting: Nurse Practitioner

## 2018-11-20 VITALS — BP 175/90 | HR 79 | Temp 97.1°F | Ht 64.0 in | Wt 158.4 lb

## 2018-11-20 DIAGNOSIS — E1165 Type 2 diabetes mellitus with hyperglycemia: Secondary | ICD-10-CM

## 2018-11-20 DIAGNOSIS — Z0000 Encounter for general adult medical examination without abnormal findings: Secondary | ICD-10-CM | POA: Diagnosis not present

## 2018-11-20 DIAGNOSIS — Z794 Long term (current) use of insulin: Secondary | ICD-10-CM | POA: Diagnosis not present

## 2018-11-20 DIAGNOSIS — I10 Essential (primary) hypertension: Secondary | ICD-10-CM

## 2018-11-20 MED ORDER — SEMAGLUTIDE (1 MG/DOSE) 2 MG/1.5ML ~~LOC~~ SOPN: 1.0000 mg | PEN_INJECTOR | SUBCUTANEOUS | 0 refills | Status: DC

## 2018-11-20 MED ORDER — INSULIN LISPRO 100 UNIT/ML ~~LOC~~ SOLN: SUBCUTANEOUS | 1 refills | Status: DC

## 2018-11-20 MED ORDER — AMLODIPINE BESYLATE 10 MG PO TABS: 10.0000 mg | ORAL_TABLET | Freq: Every evening | ORAL | 0 refills | Status: DC

## 2018-11-20 MED ORDER — NEBIVOLOL HCL 20 MG PO TABS: 20.0000 mg | ORAL_TABLET | Freq: Every day | ORAL | 1 refills | Status: DC

## 2018-11-20 NOTE — Patient Instructions (Signed)
[name] , Thank you for taking time to come for your Medicare Wellness Visit. I appreciate your ongoing commitment to your health goals. Please review the following plan we discussed and let me know if I can assist you in the future.   Screening recommendations/referrals: Colonoscopy: ~ 2018 per patient Mammogram: 06/2018 Bone Density: 06/2005 Recommended yearly ophthalmology/optometry visit for glaucoma screening and checkup Recommended yearly dental visit for hygiene and checkup  Vaccinations: Influenza vaccine: 03/2018 Pneumococcal vaccine: 06/2017 Tdap vaccine: 03/2018 Shingles vaccine: 01/2017, 03/2017    Advanced directives: Advance directive discussed with you today. Even though you declined this today please call our office should you change your mind and we can give you the proper paperwork for you to fill out.   Conditions/risks identified: Overweight  Next appointment:    Preventive Care 38 Years and Older, Female Preventive care refers to lifestyle choices and visits with your health care provider that can promote health and wellness. What does preventive care include?  A yearly physical exam. This is also called an annual well check.  Dental exams once or twice a year.  Routine eye exams. Ask your health care provider how often you should have your eyes checked.  Personal lifestyle choices, including:  Daily care of your teeth and gums.  Regular physical activity.  Eating a healthy diet.  Avoiding tobacco and drug use.  Limiting alcohol use.  Practicing safe sex.  Taking low-dose aspirin every day.  Taking vitamin and mineral supplements as recommended by your health care provider. What happens during an annual well check? The services and screenings done by your health care provider during your annual well check will depend on your age, overall health, lifestyle risk factors, and family history of disease. Counseling  Your health care provider may  ask you questions about your:  Alcohol use.  Tobacco use.  Drug use.  Emotional well-being.  Home and relationship well-being.  Sexual activity.  Eating habits.  History of falls.  Memory and ability to understand (cognition).  Work and work Statistician.  Reproductive health. Screening  You may have the following tests or measurements:  Height, weight, and BMI.  Blood pressure.  Lipid and cholesterol levels. These may be checked every 5 years, or more frequently if you are over 44 years old.  Skin check.  Lung cancer screening. You may have this screening every year starting at age 57 if you have a 30-pack-year history of smoking and currently smoke or have quit within the past 15 years.  Fecal occult blood test (FOBT) of the stool. You may have this test every year starting at age 6.  Flexible sigmoidoscopy or colonoscopy. You may have a sigmoidoscopy every 5 years or a colonoscopy every 10 years starting at age 22.  Hepatitis C blood test.  Hepatitis B blood test.  Sexually transmitted disease (STD) testing.  Diabetes screening. This is done by checking your blood sugar (glucose) after you have not eaten for a while (fasting). You may have this done every 1-3 years.  Bone density scan. This is done to screen for osteoporosis. You may have this done starting at age 45.  Mammogram. This may be done every 1-2 years. Talk to your health care provider about how often you should have regular mammograms. Talk with your health care provider about your test results, treatment options, and if necessary, the need for more tests. Vaccines  Your health care provider may recommend certain vaccines, such as:  Influenza vaccine. This is  recommended every year.  Tetanus, diphtheria, and acellular pertussis (Tdap, Td) vaccine. You may need a Td booster every 10 years.  Zoster vaccine. You may need this after age 59.  Pneumococcal 13-valent conjugate (PCV13) vaccine. One  dose is recommended after age 54.  Pneumococcal polysaccharide (PPSV23) vaccine. One dose is recommended after age 31. Talk to your health care provider about which screenings and vaccines you need and how often you need them. This information is not intended to replace advice given to you by your health care provider. Make sure you discuss any questions you have with your health care provider. Document Released: 09/09/2015 Document Revised: 05/02/2016 Document Reviewed: 06/14/2015 Elsevier Interactive Patient Education  2017 [employer] Prevention in the Home Falls can cause injuries. They can happen to people of all ages. There are many things you can do to make your home safe and to help prevent falls. What can I do on the outside of my home?  Regularly fix the edges of walkways and driveways and fix any cracks.  Remove anything that might make you trip as you walk through a door, such as a raised step or threshold.  Trim any bushes or trees on the path to your home.  Use bright outdoor lighting.  Clear any walking paths of anything that might make someone trip, such as rocks or tools.  Regularly check to see if handrails are loose or broken. Make sure that both sides of any steps have handrails.  Any raised decks and porches should have guardrails on the edges.  Have any leaves, snow, or ice cleared regularly.  Use sand or salt on walking paths during winter.  Clean up any spills in your garage right away. This includes oil or grease spills. What can I do in the bathroom?  Use night lights.  Install grab bars by the toilet and in the tub and shower. Do not use towel bars as grab bars.  Use non-skid mats or decals in the tub or shower.  If you need to sit down in the shower, use a plastic, non-slip stool.  Keep the floor dry. Clean up any water that spills on the floor as soon as it happens.  Remove soap buildup in the tub or shower regularly.  Attach bath  mats securely with double-sided non-slip rug tape.  Do not have throw rugs and other things on the floor that can make you trip. What can I do in the bedroom?  Use night lights.  Make sure that you have a light by your bed that is easy to reach.  Do not use any sheets or blankets that are too big for your bed. They should not hang down onto the floor.  Have a firm chair that has side arms. You can use this for support while you get dressed.  Do not have throw rugs and other things on the floor that can make you trip. What can I do in the kitchen?  Clean up any spills right away.  Avoid walking on wet floors.  Keep items that you use a lot in easy-to-reach places.  If you need to reach something above you, use a strong step stool that has a grab bar.  Keep electrical cords out of the way.  Do not use floor polish or wax that makes floors slippery. If you must use wax, use non-skid floor wax.  Do not have throw rugs and other things on the floor that can make you trip.  What can I do with my stairs?  Do not leave any items on the stairs.  Make sure that there are handrails on both sides of the stairs and use them. Fix handrails that are broken or loose. Make sure that handrails are as long as the stairways.  Check any carpeting to make sure that it is firmly attached to the stairs. Fix any carpet that is loose or worn.  Avoid having throw rugs at the top or bottom of the stairs. If you do have throw rugs, attach them to the floor with carpet tape.  Make sure that you have a light switch at the top of the stairs and the bottom of the stairs. If you do not have them, ask someone to add them for you. What else can I do to help prevent falls?  Wear shoes that:  Do not have high heels.  Have rubber bottoms.  Are comfortable and fit you well.  Are closed at the toe. Do not wear sandals.  If you use a stepladder:  Make sure that it is fully opened. Do not climb a closed  stepladder.  Make sure that both sides of the stepladder are locked into place.  Ask someone to hold it for you, if possible.  Clearly mark and make sure that you can see:  Any grab bars or handrails.  First and last steps.  Where the edge of each step is.  Use tools that help you move around (mobility aids) if they are needed. These include:  Canes.  Walkers.  Scooters.  Crutches.  Turn on the lights when you go into a dark area. Replace any light bulbs as soon as they burn out.  Set up your furniture so you have a clear path. Avoid moving your furniture around.  If any of your floors are uneven, fix them.  If there are any pets around you, be aware of where they are.  Review your medicines with your doctor. Some medicines can make you feel dizzy. This can increase your chance of falling. Ask your doctor what other things that you can do to help prevent falls. This information is not intended to replace advice given to you by your health care provider. Make sure you discuss any questions you have with your health care provider. Document Released: 06/09/2009 Document Revised: 01/19/2016 Document Reviewed: 09/17/2014 Elsevier Interactive Patient Education  2017 [employer].

## 2018-11-20 NOTE — Progress Notes (Addendum)
This visit type was conducted due to national recommendations for restrictions regarding the COVID-19 Pandemic (e.g. social distancing).  This format is felt to be most appropriate for this patient at this time.  All issues noted in this document were discussed and addressed.  No physical exam was performed (except for noted visual exam findings with Video Visits).  Please refer to the patient's chart (MyChart message for video visits and phone note for telephone visits) for the patient's consent to telehealth for [place].   Subjective:     Patient ID: [name] , female    DOB: 07/21/1951 , 68 y.o.   MRN: [id number]  Virtual Visit via Telephone Note I connected with@ on 11/20/18 at  3:30 PM EDT by telephone and verified that I am speaking with the [name] using two identifiers.   I discussed the limitations, risks, security and privacy concerns of performing an evaluation and management service by telephone and the availability of in person appointments. I also discussed with the patient that there may be a patient responsible charge related to this service. The patient expressed understanding and agreed to proceed.  Chief Complaint  Patient presents with  . Diabetes    History of Present Illness: Diabetes  She presents for her follow-up diabetic visit. She has type 2 diabetes mellitus. There are no hypoglycemic associated symptoms. Pertinent negatives for diabetes include no blurred vision. There are no hypoglycemic complications. She is compliant with treatment all of the time. Her home blood glucose trend is decreasing steadily. (Average 325 blood sugars.  ) An ACE inhibitor/angiotensin II receptor blocker is being taken.  Hypertension  This is a chronic problem. The current episode started more than 1 year ago. The problem has been gradually improving since onset. The problem is uncontrolled. Pertinent negatives include no anxiety or blurred vision. Risk factors for  coronary artery disease include diabetes mellitus, obesity and sedentary lifestyle. Hypertensive end-organ damage includes kidney disease. Identifiable causes of hypertension include chronic renal disease.      Past Medical History:  Diagnosis Date  . Arthritis    L knee, hands, back   . Asthma   . Diabetes mellitus type 2, insulin dependent ([place])   . Fibromyalgia   . Full dentures   . GERD (gastroesophageal reflux disease)   . H/O hiatal hernia   . Headache    h/o migraines - was followed for a time with a wellness doctor  . History of esophageal dilatation   . History of rhabdomyolysis    03/ 2015  . Hyperlipidemia   . Hypertension   . Insulin pump in place    since 12/ 2014--  [facility]  . Osteoarthritis of left knee, primary localized 08/17/2014  . Renal insufficiency   . SUI (stress urinary incontinence, female)   . Wears glasses      Family History  Problem Relation Age of Onset  . Diabetes Mother   . Hypertension Mother   . Lung cancer Mother        smoked  . Diabetes Father   . Hypertension Father   . Lung cancer Father        smoked  . Diabetes Brother   . Heart failure Sister   . Diabetes Maternal Grandmother   . Cancer Maternal Grandmother      Current Outpatient Medications:  .  albuterol (PROAIR HFA) 108 (90 Base) MCG/ACT inhaler, 2 puffs every 4 hours as needed only  if your can't catch your breath (  Patient taking differently: Inhale 1-2 puffs into the lungs every 4 (four) hours as needed for wheezing. ), Disp: 1 Inhaler, Rfl: 11 .  amLODipine (NORVASC) 10 MG tablet, Take 10 mg by mouth every evening. , Disp: , Rfl:  .  BAYER CONTOUR NEXT TEST test strip, use AS INSTRUCTED TO CHECK BLOOD SUGARS 7 TIMES A DAY, Disp: 300 each, Rfl: 5 .  BAYER MICROLET LANCETS lancets, Use as instructed to check blood sugar 7 times per day dx code E11.65, Disp: 300 each, Rfl: 5 .  diazepam (VALIUM) 5 MG tablet, Take 1 tablet (5 mg total) by mouth 2 (two) times daily.  (Patient not taking: Reported on 11/20/2018), Disp: 10 tablet, Rfl: 0 .  docusate sodium (COLACE) 100 MG capsule, Take 100 mg by mouth 2 (two) times daily as needed for mild constipation., Disp: , Rfl:  .  gabapentin (NEURONTIN) 300 MG capsule, Take 1 capsule (300 mg total) by mouth 3 (three) times daily., Disp: 90 capsule, Rfl: 3 .  glucagon (GLUCAGON EMERGENCY) 1 MG injection, Inject 1 mg into the vein once as needed (for low blood sugar). (Patient not taking: Reported on 11/20/2018), Disp: 1 each, Rfl: 12 .  HYDROcodone-acetaminophen (NORCO/VICODIN) 5-325 MG tablet, Take 1 tablet by mouth every 4 (four) hours as needed. (Patient not taking: Reported on 11/20/2018), Disp: 10 tablet, Rfl: 0 .  insulin lispro (HUMALOG) 100 UNIT/ML injection, INJECT MAX OF 90 UNITS SUBCUTANEOUSLY EVERY DAY WITH INSULIN PUMP, Disp: 40 mL, Rfl: 0 .  losartan (COZAAR) 50 MG tablet, Take 1 tablet (50 mg total) by mouth daily., Disp: 90 tablet, Rfl: 3 .  mometasone-formoterol (DULERA) 100-5 MCG/ACT AERO, Take 2 puffs first thing in am and then another 2 puffs about 12 hours later. (Patient taking differently: Inhale 2 puffs into the lungs 2 (two) times daily. ), Disp: 1 Inhaler, Rfl: 11 .  MYRBETRIQ 50 MG TB24 tablet, TK 1 T PO  QD, Disp: , Rfl:  .  Nebivolol HCl (BYSTOLIC) 20 MG TABS, Take 1 tablet (20 mg total) by mouth daily., Disp: 90 tablet, Rfl: 1 .  nitrofurantoin (MACRODANTIN) 100 MG capsule, TK ONE C PO  QD, Disp: , Rfl:  .  ondansetron (ZOFRAN) 4 MG tablet, Take 1 tablet (4 mg total) by mouth every 6 (six) hours. (Patient taking differently: Take 4 mg by mouth every 6 (six) hours. As needed), Disp: 12 tablet, Rfl: 0 .  oxybutynin (DITROPAN-XL) 10 MG 24 hr tablet, Take 10 mg by mouth at bedtime. , Disp: , Rfl: 1 .  pantoprazole (PROTONIX) 40 MG tablet, Take 30- 60 min before your first and last meals of the day (Patient taking differently: Take 40 mg by mouth daily. ), Disp: 60 tablet, Rfl: 11 .  pravastatin  (PRAVACHOL) 80 MG tablet, TAKE 1 TABLET BY MOUTH EVERY DAY, Disp: 90 tablet, Rfl: 0 .  Semaglutide,0.25 or 0.5MG /DOS, (OZEMPIC, 0.25 OR 0.5 MG/DOSE,) 2 MG/1.5ML SOPN, Inject 0.5 mg into the skin once a week. Inject 0.5mg  by subcutaneous route on the same day of each week in the abdomen, thighs or upper arm., Disp: 1 pen, Rfl: 1 .  spironolactone (ALDACTONE) 50 MG tablet, Take 1 tablet (50 mg total) by mouth daily., Disp: 90 tablet, Rfl: 1 .  sucralfate (CARAFATE) 1 g tablet, Take 1 g by mouth daily. , Disp: , Rfl:  .  VICTOZA 18 MG/3ML SOPN, inject 1.2 milligram subcutaneously daily (Patient not taking: No sig reported), Disp: 18 mL, Rfl: 1 .  Vitamin  D, Ergocalciferol, (DRISDOL) 1.25 MG (50000 UT) CAPS capsule, TAKE 1 CAPSULE BY MOUTH EVERY 7 DAYS ON WEDNESDAY (Patient taking differently: Take 50,000 Units by mouth every 7 (seven) days. ), Disp: 5 capsule, Rfl: 0   Allergies  Allergen Reactions  . Pollen Extract   . Tomato   . Codeine Hives, Itching and Nausea And Vomiting     Review of Systems  Eyes: Negative for blurred vision.  All other systems reviewed and are negative.    There were no vitals filed for this visit.         Assessment and Plan: 1. Uncontrolled type 2 diabetes mellitus with hyperglycemia (HCC)  Blood sugars are elevated, she is to come tomorrow am for her labs only  She is testing her blood sugars 4 to 6 times per day  The patient is being treated with 3 or more insulin injections a day  She is to increase her Ozempic to 1 mg weekly - insulin lispro (HUMALOG) 100 UNIT/ML injection; Inject 25 units in AM, 35 units at lunchtime, and 25 units at evening meal  Dispense: 4 vial; Refill: 1 - amLODipine (NORVASC) 10 MG tablet; Take 1 tablet (10 mg total) by mouth every evening.  Dispense: 90 tablet; Refill: 0 - Nebivolol HCl (BYSTOLIC) 20 MG TABS; Take 1 tablet (20 mg total) by mouth daily.  Dispense: 90 tablet; Refill: 1 - Semaglutide, 1 MG/DOSE, (OZEMPIC, 1  MG/DOSE,) 2 MG/1.5ML SOPN; Inject 1 mg into the skin once a week.  Dispense: 6 pen; Refill: 0 - Hemoglobin A1c; Future - CMP14 + Anion Gap  2. HYPERTENSION, BENIGN ESSENTIAL  Telephone visit, unable to check blood pressure however I did refill her medications  She is to come in am for her labs only - amLODipine (NORVASC) 10 MG tablet; Take 1 tablet (10 mg total) by mouth every evening.  Dispense: 90 tablet; Refill: 0 - CMP14 + Anion Gap    Follow Up Instructions:   I discussed the assessment and treatment plan with the patient. The patient was provided an opportunity to ask questions and all were answered. The patient agreed with the plan and demonstrated an understanding of the instructions.   The patient was advised to call back or seek an in-person evaluation if the symptoms worsen or if the condition fails to improve as anticipated.  COVID-19 Education: The signs and symptoms of COVID-19 were discussed with the patient and how to seek care for testing (follow up with PCP or arrange E-visit).  The importance of social distancing was discussed today.   Patient Risk:   After full review of this patients clinical status, I feel that they are at least moderate risk at this time.   I provided 13 minutes of non-face-to-face time during this encounter.   [name], FNP

## 2018-11-20 NOTE — Progress Notes (Signed)
Subjective:   [name] is a 68 y.o. female who presents for Medicare Annual (Subsequent) preventive examination.  This visit is being conducted via telephone due to the COVID-19 pandemic.  This patient has given me verbal consent via telephone to conduct this visit.  Some vital signs may be absent or patient reported.   Review of Systems:  n/a Cardiac Risk Factors include: advanced age (>41men, >15 women);diabetes mellitus;hypertension     Objective:     Vitals: BP (!) 175/90 Comment: patient reported  Pulse 79 Comment: patient reported  Temp (!) 97.1 F (36.2 C) Comment: patient reported  Ht 5\' 4"  (1.626 m) Comment: patient reported  Wt 158 lb 6.4 oz (71.8 kg) Comment: patient reported  LMP  (LMP Unknown)   BMI 27.19 kg/m   Body mass index is 27.19 kg/m.  Advanced Directives 11/20/2018 10/01/2018 07/15/2018 12/26/2017 08/17/2014 08/06/2014 06/06/2014  Does Patient Have a Medical Advance Directive? No No No No No No No  Would patient like information on creating a medical advance directive? No - Patient declined No - Patient declined No - Patient declined - Yes - Scientist, clinical (histocompatibility and immunogenetics) given Yes - Scientist, clinical (histocompatibility and immunogenetics) given No - patient declined information  Pre-existing out of facility DNR order (yellow form or pink MOST form) - - - - - - -    Tobacco Social History   Tobacco Use  Smoking Status Never Smoker  Smokeless Tobacco Never Used     Counseling given: Not Answered   Clinical Intake:  Pre-visit preparation completed: Yes  Pain : No/denies pain Pain Score: 0-No pain     Nutritional Status: BMI 25 -29 Overweight Nutritional Risks: None Diabetes: Yes  How often do you need to have someone help you when you read instructions, pamphlets, or other written materials from your doctor or pharmacy?: 1 - Never What is the last grade level you completed in school?: 12th grade  Interpreter Needed?: No  Information entered by :: NAllen LPN  Past  Medical History:  Diagnosis Date  . Arthritis    L knee, hands, back   . Asthma   . Diabetes mellitus type 2, insulin dependent ([place])   . Fibromyalgia   . Full dentures   . GERD (gastroesophageal reflux disease)   . H/O hiatal hernia   . Headache    h/o migraines - was followed for a time with a wellness doctor  . History of esophageal dilatation   . History of rhabdomyolysis    03/ 2015  . Hyperlipidemia   . Hypertension   . Insulin pump in place    since 12/ 2014--  [facility]  . Osteoarthritis of left knee, primary localized 08/17/2014  . Renal insufficiency   . SUI (stress urinary incontinence, female)   . Wears glasses    Past Surgical History:  Procedure Laterality Date  . BLADDER SUSPENSION N/A 03/09/2014   Procedure: CYSTOSCOPY/SLING;  Surgeon: Reece Packer, MD;  Location: [facility];  Service: Urology;  Laterality: N/A;  . CARDIAC CATHETERIZATION  07-20-2009   DR Shelva Majestic   MODERATE LVH/  NORMAL  LVEF/  NORMAL CORONARY AND RENAL ARTERIES  . CARPAL TUNNEL RELEASE Right 2000  . CHOLECYSTECTOMY  1990  . COLONOSCOPY WITH ESOPHAGOGASTRODUODENOSCOPY (EGD)  06-18-2002  . ESOPHAGOGASTRODUODENOSCOPY (EGD) WITH ESOPHAGEAL DILATION  06-12-2010  . EXCISION LEFT UPPER ARM MASS  01-29-2014  . EYE SURGERY  2010   laser on R eye  . KNEE ARTHROSCOPY Right 1989  &  2002  . NEGATIVE SLEEP STUDY  2012   PER PT  . PARTIAL KNEE ARTHROPLASTY Left 08/17/2014   Procedure: LEFT UNICOMPARTMENTAL KNEE;  Surgeon: Johnny Bridge, MD;  Location: [place];  Service: Orthopedics;  Laterality: Left;   Family History  Problem Relation Age of Onset  . Diabetes Mother   . Hypertension Mother   . Lung cancer Mother        smoked  . Diabetes Father   . Hypertension Father   . Lung cancer Father        smoked  . Diabetes Brother   . Heart failure Sister   . Diabetes Maternal Grandmother   . Cancer Maternal Grandmother    Social History   Socioeconomic History  .  Marital status: Divorced    Spouse name: Not on file  . Number of children: Not on file  . Years of education: Not on file  . Highest education level: Not on file  Occupational History  . Occupation: disability  Social Needs  . Financial resource strain: Not hard at all  . Food insecurity:    Worry: Never true    Inability: Never true  . Transportation needs:    Medical: No    Non-medical: No  Tobacco Use  . Smoking status: Never Smoker  . Smokeless tobacco: Never Used  Substance and Sexual Activity  . Alcohol use: No  . Drug use: No  . Sexual activity: Not Currently  Lifestyle  . Physical activity:    Days per week: 3 days    Minutes per session: 60 min  . Stress: Not at all  Relationships  . Social connections:    Talks on phone: Not on file    Gets together: Not on file    Attends religious service: Not on file    Active member of club or organization: Not on file    Attends meetings of clubs or organizations: Not on file    Relationship status: Not on file  Other Topics Concern  . Not on file  Social History Narrative  . Not on file    Outpatient Encounter Medications as of 11/20/2018  Medication Sig  . albuterol (PROAIR HFA) 108 (90 Base) MCG/ACT inhaler 2 puffs every 4 hours as needed only  if your can't catch your breath (Patient taking differently: Inhale 1-2 puffs into the lungs every 4 (four) hours as needed for wheezing. )  . amLODipine (NORVASC) 10 MG tablet Take 10 mg by mouth every evening.   [name] BAYER CONTOUR NEXT TEST test strip use AS INSTRUCTED TO CHECK BLOOD SUGARS 7 TIMES A DAY  . BAYER MICROLET LANCETS lancets Use as instructed to check blood sugar 7 times per day dx code E11.65  . docusate sodium (COLACE) 100 MG capsule Take 100 mg by mouth 2 (two) times daily as needed for mild constipation.  . gabapentin (NEURONTIN) 300 MG capsule Take 1 capsule (300 mg total) by mouth 3 (three) times daily.  . insulin lispro (HUMALOG) 100 UNIT/ML injection INJECT  MAX OF 90 UNITS SUBCUTANEOUSLY EVERY DAY WITH INSULIN PUMP  . losartan (COZAAR) 50 MG tablet Take 1 tablet (50 mg total) by mouth daily.  . mometasone-formoterol (DULERA) 100-5 MCG/ACT AERO Take 2 puffs first thing in am and then another 2 puffs about 12 hours later. (Patient taking differently: Inhale 2 puffs into the lungs 2 (two) times daily. )  . MYRBETRIQ 50 MG TB24 tablet TK 1 T PO  QD  . Nebivolol  HCl (BYSTOLIC) 20 MG TABS Take 1 tablet (20 mg total) by mouth daily.  . nitrofurantoin (MACRODANTIN) 100 MG capsule TK ONE C PO  QD  . ondansetron (ZOFRAN) 4 MG tablet Take 1 tablet (4 mg total) by mouth every 6 (six) hours. (Patient taking differently: Take 4 mg by mouth every 6 (six) hours. As needed)  . pantoprazole (PROTONIX) 40 MG tablet Take 30- 60 min before your first and last meals of the day (Patient taking differently: Take 40 mg by mouth daily. )  . pravastatin (PRAVACHOL) 80 MG tablet TAKE 1 TABLET BY MOUTH EVERY DAY  . Semaglutide,0.25 or 0.5MG /DOS, (OZEMPIC, 0.25 OR 0.5 MG/DOSE,) 2 MG/1.5ML SOPN Inject 0.5 mg into the skin once a week. Inject 0.5mg  by subcutaneous route on the same day of each week in the abdomen, thighs or upper arm.  . spironolactone (ALDACTONE) 50 MG tablet Take 1 tablet (50 mg total) by mouth daily.  . Vitamin D, Ergocalciferol, (DRISDOL) 1.25 MG (50000 UT) CAPS capsule TAKE 1 CAPSULE BY MOUTH EVERY 7 DAYS ON WEDNESDAY (Patient taking differently: Take 50,000 Units by mouth every 7 (seven) days. )  . diazepam (VALIUM) 5 MG tablet Take 1 tablet (5 mg total) by mouth 2 (two) times daily. (Patient not taking: Reported on 11/20/2018)  . glucagon (GLUCAGON EMERGENCY) 1 MG injection Inject 1 mg into the vein once as needed (for low blood sugar). (Patient not taking: Reported on 11/20/2018)  . HYDROcodone-acetaminophen (NORCO/VICODIN) 5-325 MG tablet Take 1 tablet by mouth every 4 (four) hours as needed. (Patient not taking: Reported on 11/20/2018)  . oxybutynin  (DITROPAN-XL) 10 MG 24 hr tablet Take 10 mg by mouth at bedtime.   . sucralfate (CARAFATE) 1 g tablet Take 1 g by mouth daily.   [name] VICTOZA 18 MG/3ML SOPN inject 1.2 milligram subcutaneously daily (Patient not taking: No sig reported)   No facility-administered encounter medications on file as of 11/20/2018.     Activities of Daily Living In your present state of health, do you have any difficulty performing the following activities: 11/20/2018  Hearing? N  Vision? N  Difficulty concentrating or making decisions? N  Walking or climbing stairs? N  Dressing or bathing? N  Doing errands, shopping? N  Preparing Food and eating ? N  Using the Toilet? N  In the past six months, have you accidently leaked urine? Y  Comment sees urology  Do you have problems with loss of bowel control? N  Managing your Medications? N  Managing your Finances? N  Housekeeping or managing your Housekeeping? N  Some recent data might be hidden    Patient Care Team: [name], MD as PCP - General (Internal Medicine) Elayne Snare, MD as Consulting Physician (Endocrinology) Bjorn Loser, MD as Consulting Physician (Urology) Juanita Craver, MD as Consulting Physician (Gastroenterology)    Assessment:   This is a routine wellness examination for [place].  Exercise Activities and Dietary recommendations Current Exercise Habits: Home exercise routine, Type of exercise: treadmill, Time (Minutes): 60, Frequency (Times/Week): 3, Weekly Exercise (Minutes/Week): 180  Goals    . Patient Stated (pt-stated)     Wants to increase activity. Does not want to be tired all the time       Fall Risk Fall Risk  11/20/2018 06/09/2018  Falls in the past year? 0 Yes  Number falls in past yr: - 2 or more  Injury with Fall? - Yes  Risk for fall due to : Medication side effect -  Follow  up Education provided -   Is the patient's home free of loose throw rugs in walkways, pet beds, electrical cords, etc?   yes       Grab bars in the bathroom? yes      Handrails on the stairs?   yes      Adequate lighting?   yes  Timed Get Up and Go performed: n/a  Depression Screen PHQ 2/9 Scores 11/20/2018 06/09/2018  PHQ - 2 Score 0 0  PHQ- 9 Score 3 -     Cognitive Function     6CIT Screen 11/20/2018  What Year? 0 points  What month? 0 points  What time? 0 points  Count back from 20 0 points  Months in reverse 0 points  Repeat phrase 0 points  Total Score 0    Immunization History  Administered Date(s) Administered  . Influenza Whole 06/27/2009, 06/13/2010  . Influenza,inj,Quad PF,6+ Mos 06/03/2017  . Influenza-Unspecified 03/27/2013, 03/27/2014, 05/18/2015  . Pneumococcal Conjugate-13 07/24/2017  . Pneumococcal Polysaccharide-23 08/27/2006, 11/29/2009  . Td 10/18/2009  . Tdap 12/27/2017    Qualifies for Shingles Vaccine? yes  Screening Tests Health Maintenance  Topic Date Due  . OPHTHALMOLOGY EXAM  07/10/1962  . COLONOSCOPY  07/10/2002  . DEXA SCAN  07/10/2017  . FOOT EXAM  04/01/2018  . PNA vac Low Risk Adult (2 of 2 - PPSV23) 07/24/2018  . HEMOGLOBIN A1C  12/09/2018  . MAMMOGRAM  07/08/2020  . TETANUS/TDAP  12/28/2027  . INFLUENZA VACCINE  Completed  . Hepatitis C Screening  Completed    Cancer Screenings: Lung: Low Dose CT Chest recommended if Age 22-80 years, 30 pack-year currently smoking OR have quit w/in 15years. Patient does not qualify. Breast:  Up to date on Mammogram? Yes   Up to date of Bone Density/Dexa? Yes Colorectal: up to date  Additional Screenings: : Hepatitis C Screening: 09/23/2015 0.3     Plan:    Wants to increase activity. States that she had a colonoscopy in the past year or two with Dr. Collene Mares.   I have personally reviewed and noted the following in the patient's chart:   . Medical and social history . Use of alcohol, tobacco or illicit drugs  . Current medications and supplements . Functional ability and status . Nutritional status . Physical  activity . Advanced directives . List of other physicians . Hospitalizations, surgeries, and ER visits in previous 12 months . Vitals . Screenings to include cognitive, depression, and falls . Referrals and appointments  In addition, I have reviewed and discussed with patient certain preventive protocols, quality metrics, and best practice recommendations. A written personalized care plan for preventive services as well as general preventive health recommendations were provided to patient.     [name], LPN  01/22/4131

## 2018-11-21 ENCOUNTER — Other Ambulatory Visit: Payer: Medicare Other

## 2018-11-21 DIAGNOSIS — E1165 Type 2 diabetes mellitus with hyperglycemia: Secondary | ICD-10-CM | POA: Diagnosis not present

## 2018-11-21 LAB — HEMOGLOBIN A1C
ESTIMATED AVERAGE GLUCOSE: 298 mg/dL
Hgb A1c MFr Bld: 12 % — ABNORMAL HIGH (ref 4.8–5.6)

## 2018-11-24 ENCOUNTER — Ambulatory Visit: Payer: Self-pay

## 2018-11-24 DIAGNOSIS — I10 Essential (primary) hypertension: Secondary | ICD-10-CM

## 2018-11-24 DIAGNOSIS — E1165 Type 2 diabetes mellitus with hyperglycemia: Secondary | ICD-10-CM

## 2018-11-24 NOTE — Progress Notes (Signed)
I will be increasing her insulin, I will also ask her what she has done differently.  I advised her to increase her Ozempic to 1mg  and will refer to CCM - nurse and pharmacy to see if there is any way to help her with getting [place].

## 2018-11-24 NOTE — Chronic Care Management (AMB) (Signed)
   Care Management Note   [name] is a 68 y.o. year old female who is a primary care patient of [name], MD . The CM team was consulted for assistance with chronic disease management and care coordination.  Review of patient status, including review of consultants reports, rand collaboration with appropriate care team members and the patient's provider was performed as part of comprehensive patient evaluation and provision of chronic care management services. Telephone outreach to patient today to introduce CCM services.   I reached out to [name] by phone today.   [name] was given information about Chronic Care Management services today including:  1. CCM service includes personalized support from designated clinical staff supervised by her physician, including individualized plan of care and coordination with other care providers 2. 24/7 contact phone numbers for assistance for urgent and routine care needs. 3. Service will only be billed when office clinical staff spend 20 minutes or more in a month to coordinate care. 4. Only one practitioner may furnish and bill the service in a calendar month. 5. The patient may stop CCM services at any time (effective at the end of the month) by phone call to the office staff. 6. The patient will be responsible for cost sharing (co-pay) of up to 20% of the service fee (after annual deductible is met).   Patient agreed to services and verbal consent obtained.    During today's call I reviewed Social Determinant of Health with the patient. No challenges indicated at this time. The patient does not have an advance directive and declines education at this time. The patient was provided this SW direct contact information in the even she identifies future SW needs. SW explained to the patient to expect a call from the [facility] over the next 7-10 days to complete nursing assessment.   Follow Up Plan: SW will  sign off at this time. The patient will be contacted by CCM RNCM in the next 7-10 days.   Daneen Schick, BSW, CDP [facility] Care Management Social Worker [phone]  Total time spent performing care coordination and/or care management activities with the patient by phone or face to face = 20 minutes.

## 2018-11-24 NOTE — Patient Instructions (Signed)
Social Worker Visit Information   Materials provided: No: Patient declined  [name] was given information about Chronic Care Management services today including:  1. CCM service includes personalized support from designated clinical staff supervised by her physician, including individualized plan of care and coordination with other care providers 2. 24/7 contact phone numbers for assistance for urgent and routine care needs. 3. Service will only be billed when office clinical staff spend 20 minutes or more in a month to coordinate care. 4. Only one practitioner may furnish and bill the service in a calendar month. 5. The patient may stop CCM services at any time (effective at the end of the month) by phone call to the office staff. 6. The patient will be responsible for cost sharing (co-pay) of up to 20% of the service fee (after annual deductible is met).  Patient agreed to services and verbal consent obtained.   The patient verbalized understanding of instructions provided today and declined a print copy of patient instruction materials.   Follow up plan: CCM RN CM to follow up with the patient in the next 7-10 days.   Daneen Schick, BSW, CDP [facility] / [facility] Care Management Social Worker [phone]

## 2018-11-27 ENCOUNTER — Telehealth: Payer: Self-pay

## 2018-11-28 ENCOUNTER — Telehealth: Payer: Self-pay

## 2018-11-28 ENCOUNTER — Encounter: Payer: Self-pay | Admitting: Nurse Practitioner

## 2018-11-28 ENCOUNTER — Ambulatory Visit: Payer: Self-pay

## 2018-11-28 DIAGNOSIS — E1165 Type 2 diabetes mellitus with hyperglycemia: Secondary | ICD-10-CM

## 2018-11-28 DIAGNOSIS — I10 Essential (primary) hypertension: Secondary | ICD-10-CM

## 2018-11-28 NOTE — Chronic Care Management (AMB) (Signed)
  Chronic Care Management   Outreach Note  11/28/2018 Name: KARIS RILLING MRN: [id number] DOB: 1951/08/23  Referred by: [name], MD Reason for referral : Chronic Care Management (INITIAL CCM TELEPHONE OUTREACH)   An unsuccessful telephone outreach was attempted today. The patient was referred to the case management team by [name] FNP for assistance with Diabetes and Hypertension disease management.   Follow Up Plan: The CM team will reach out to the patient again over the next 5-7 days.    [name], RN,CCM Care Management Coordinator [place] [facility]  Direct Phone: [phone]

## 2018-12-01 ENCOUNTER — Other Ambulatory Visit: Payer: Self-pay | Admitting: Nurse Practitioner

## 2018-12-01 DIAGNOSIS — E119 Type 2 diabetes mellitus without complications: Secondary | ICD-10-CM

## 2018-12-01 MED ORDER — FREESTYLE LIBRE 14 DAY READER DEVI: 1.0000 | Freq: Three times a day (TID) | 0 refills | Status: DC

## 2018-12-01 MED ORDER — FREESTYLE LIBRE 14 DAY SENSOR MISC: 1.0000 | 11 refills | Status: DC

## 2018-12-03 ENCOUNTER — Telehealth: Payer: Medicare Other

## 2018-12-06 ENCOUNTER — Other Ambulatory Visit: Payer: Self-pay | Admitting: Nurse Practitioner

## 2018-12-10 ENCOUNTER — Telehealth: Payer: Self-pay

## 2018-12-12 ENCOUNTER — Telehealth: Payer: Self-pay

## 2018-12-16 ENCOUNTER — Other Ambulatory Visit: Payer: Self-pay

## 2018-12-16 ENCOUNTER — Ambulatory Visit (INDEPENDENT_AMBULATORY_CARE_PROVIDER_SITE_OTHER): Payer: Medicare Other | Admitting: Pharmacist

## 2018-12-16 ENCOUNTER — Other Ambulatory Visit: Payer: Self-pay | Admitting: Nurse Practitioner

## 2018-12-16 ENCOUNTER — Encounter: Payer: Self-pay | Admitting: Nurse Practitioner

## 2018-12-16 DIAGNOSIS — I10 Essential (primary) hypertension: Secondary | ICD-10-CM

## 2018-12-16 DIAGNOSIS — E119 Type 2 diabetes mellitus without complications: Secondary | ICD-10-CM | POA: Diagnosis not present

## 2018-12-16 DIAGNOSIS — E1165 Type 2 diabetes mellitus with hyperglycemia: Secondary | ICD-10-CM

## 2018-12-16 MED ORDER — LANCETS MISC. MISC: 1.0000 | Freq: Three times a day (TID) | 3 refills | Status: DC

## 2018-12-16 MED ORDER — BLOOD GLUCOSE MONITORING SUPPL DEVI: 1.0000 | Freq: Four times a day (QID) | 0 refills | Status: DC

## 2018-12-16 MED ORDER — BLOOD GLUCOSE MONITORING SUPPL DEVI: 1.0000 | Freq: Four times a day (QID) | 0 refills | Status: AC

## 2018-12-16 MED ORDER — GLUCOSE BLOOD VI STRP: ORAL_STRIP | 3 refills | Status: DC

## 2018-12-16 NOTE — Telephone Encounter (Signed)
Called pt to notify her that the Freestyle libre 14 day reader device has been denied by her insurance and she will have to continue checking her sugars as she was before with the finger stick pt stated she needed a new meter. Rx has been sent to pharmacy. YRL,RMA

## 2018-12-17 ENCOUNTER — Telehealth: Payer: Self-pay

## 2018-12-18 ENCOUNTER — Telehealth: Payer: Self-pay

## 2018-12-18 ENCOUNTER — Ambulatory Visit: Payer: Self-pay | Admitting: *Deleted

## 2018-12-18 DIAGNOSIS — E1165 Type 2 diabetes mellitus with hyperglycemia: Secondary | ICD-10-CM

## 2018-12-18 DIAGNOSIS — I10 Essential (primary) hypertension: Secondary | ICD-10-CM

## 2018-12-18 NOTE — Chronic Care Management (AMB) (Signed)
  Chronic Care Management   Outreach Note  12/18/2018 Name: ANALYSE ANGST MRN: [id number] DOB: [birth year]  Referred by: [name], MD Reason for referral : Chronic Care Management (Telephone Outreach Unsuccessful)   Second unsuccessful telephone outreach was attempted today. The patient was referred to the case management team by for assistance with HTN and DM management and care coordination.   Follow Up Plan: The CM team will reach out to the patient again over the next 7 days.    [place] Nurse Care Coordinator [facility] Care Management [phone]

## 2018-12-18 NOTE — Patient Instructions (Addendum)
Dear Ms. Ria Comment,   I work directly with Dr. [name] and [name] FNP at [facility]. I am trying to contact you regarding additional support to help manage your diabetes and hypertension. I was unable to make contact with you today by phone. I or one of my teammates will reach out to you again in the coming week.   Please do not hesitate to contact me directly if I may be of assistance to you.   Thank you,   [place] Nurse Care Coordinator [facility] Care Management [phone]

## 2018-12-23 ENCOUNTER — Ambulatory Visit: Payer: Self-pay

## 2018-12-23 ENCOUNTER — Telehealth: Payer: Self-pay

## 2018-12-23 DIAGNOSIS — E119 Type 2 diabetes mellitus without complications: Secondary | ICD-10-CM

## 2018-12-23 DIAGNOSIS — R413 Other amnesia: Secondary | ICD-10-CM

## 2018-12-23 DIAGNOSIS — E1165 Type 2 diabetes mellitus with hyperglycemia: Secondary | ICD-10-CM

## 2018-12-23 DIAGNOSIS — I10 Essential (primary) hypertension: Secondary | ICD-10-CM

## 2018-12-23 NOTE — Chronic Care Management (AMB) (Signed)
  Chronic Care Management   Outreach Note  12/23/2018 Name: [name] MRN: [id number] DOB: 1950/11/07  Referred by: [name], MD Reason for referral : Chronic Care Management (CCM Telephone Follow Up )   Third unsuccessful telephone outreach was attempted today. The patient was referred to the case management team for assistance with HTN and DM management and care coordination.    Follow Up Plan: The CCM team will make one final attempt to reach the patient over the next 7-10 days to engage for CCM services.   [name], RN,CCM Care Management Coordinator [place] [facility]  Direct Phone: [phone]

## 2018-12-24 ENCOUNTER — Ambulatory Visit: Payer: Self-pay | Admitting: Pharmacist

## 2018-12-24 DIAGNOSIS — I10 Essential (primary) hypertension: Secondary | ICD-10-CM

## 2018-12-24 DIAGNOSIS — E1165 Type 2 diabetes mellitus with hyperglycemia: Secondary | ICD-10-CM

## 2018-12-24 NOTE — Progress Notes (Signed)
  Chronic Care Management   Initial Visit Note  12/24/2018 Name: [name] MRN: [id number] DOB: 30-Oct-1950  Referred by: [name], MD Reason for referral : Chronic Care Management   [name] is a 68 y.o. year old female who is a primary care patient of [name], MD. The CCM team was consulted for assistance with chronic disease management and care coordination needs.   Review of patient status, including review of consultants reports, relevant laboratory and other test results, and collaboration with appropriate care team members and the patient's provider was performed as part of comprehensive patient evaluation and provision of chronic care management services.    Objective:   Goals Addressed            This Visit's Progress     Patient Stated   . "I would like a glucometer that scans my blood sugar-FreeStyle Libre" (pt-stated)       Current Barriers:  . Extremity soreness/irritation from traditional BG testing  . 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 Comprehensive medication review performed. . Collaboration with [place] ([place] team [phone]) to confirm patient has been approved for CGM device [facility] [place]).  Patient to receive device & sensors within 14 days per respresentative.  [name] will request PCP notes to conrfirm QID testing/injecting via fax. community pharmacy  . Collaboration with provider re: medication management  Patient Self Care Activities:  . Calls pharmacy for medication refills . Performs ADL's independently . Calls provider office for new concerns or questions  Initial goal documentation        PLAN -The CM team will reach out to the patient again over the next 7 days.  Will  address BG management at next visit -I will f/u with [place] next week to check on status of FreeStyle Idamae Schuller, PharmD, [place] Pharmacist, [place]: [phone]

## 2018-12-24 NOTE — Progress Notes (Signed)
Chronic Care Management   Visit Note  12/24/2018 Name: LASONIA CASINO MRN: [id number] DOB: 18-Jan-1951  Referred by: [name], MD Reason for referral : Chronic Care Management-->medication management   MYLIE MCCURLEY is a 68 y.o. year old female who is a primary care patient of [name], MD. The CCM team was consulted for assistance with chronic disease management and care coordination needs.   Review of patient status, including review of consultants reports, relevant laboratory and other test results, and collaboration with appropriate care team members and the patient's provider was performed as part of comprehensive patient evaluation and provision of chronic care management services.    Objective:   Goals Addressed            This Visit's Progress     Patient Stated   . "I would like a glucometer that scans my blood sugar-FreeStyle Libre" (pt-stated)       Current Barriers:  . Extremity soreness/irritation from traditional BG testing  . 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 Comprehensive medication review performed. . Collaboration with [place] ([place] team [phone]) to confirm patient has been approved for CGM device [facility]).  Patient to receive device & sensors within 14 days per respresentative.  [name] will request PCP notes to conrfirm QID testing/injecting via fax. community pharmacy (faxed to [phone] on 4/29) . Collaboration with provider re: medication management  Patient Self Care Activities:  . Calls pharmacy for medication refills . Performs ADL's independently . Calls provider office for new concerns or questions  Please see past updates related to this goal by clicking on the "Past  Updates" button in the selected goal      . I would like to control my blood sugars (pt-stated)       Current Barriers:  [name] Knowledge Deficits related to diabetes management . Non Adherence to prescribed medication regimen  Pharmacist Clinical Goal(s):  [name] Over the next 30 days, patient will demonstrate Improved medication adherence as evidenced by controlled BGs, taking medication as prescribed, checking BGs with new scanner glucometer  . Over the next 90 days, patient will strive to lower A1c towards goal ~7% (last A1c on 11/21/18 was 12).  Interventions: . Advised patient to continue checking BG with current meter.  It will take ~14 days for patient to receive new scanner glucometer [facility] [place])  . Counseled patient on DM healthy diet.  She states she is eating "better".  She states she is trying to control her carbohydrate intake, maintain protein and vegetable.  She states her BGs have been better than ever: FBG 123, other BGs throughout the day have been running in the low 200s (she states is good for her as she runs normally in 300s).    Patient Self Care Activities:  . Currently UNABLE TO independently meet diabetes goal.  She is improving! . Calls pharmacy for medication refills . Calls provider office for new concerns or questions  Initial goal documentation      PLAN: -I will follow up with [place] later this week to confirm receipt of documents -The CM team will reach out to the patient again over the next 7 days.   Regina Eck, PharmD, BCPS Clinical Pharmacist, [facility] [place]:  [phone]     

## 2018-12-24 NOTE — Patient Instructions (Signed)
Visit Information  Goals Addressed            This Visit's Progress     Patient Stated   . "I would like a glucometer that scans my blood sugar-FreeStyle Libre" (pt-stated)       Current Barriers:  . Extremity soreness/irritation from traditional BG testing  . 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 Comprehensive medication review performed. . Collaboration with [place] ([place] team [phone]) to confirm patient has been approved for CGM device [facility]).  Patient to receive device & sensors within 14 days per respresentative.  [name] will request PCP notes to conrfirm QID testing/injecting via fax. community pharmacy  . Collaboration with provider re: medication management  Patient Self Care Activities:  . Calls pharmacy for medication refills . Performs ADL's independently . Calls provider office for new concerns or questions  Initial goal documentation        The patient verbalized understanding of instructions provided today and declined a print copy of patient instruction materials.   The CM team will reach out to the patient again over the next 7 days.   Regina Eck, PharmD, BCPS Clinical Pharmacist, [facility] [place]: [phone]

## 2018-12-24 NOTE — Patient Instructions (Signed)
Visit Information  Goals Addressed            This Visit's Progress     Patient Stated   . "I would like a glucometer that scans my blood sugar-FreeStyle Libre" (pt-stated)       Current Barriers:  . Extremity soreness/irritation from traditional BG testing  . 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 Comprehensive medication review performed. . Collaboration with [place] ([place] team [phone]) to confirm patient has been approved for CGM device [facility] [place]).  Patient to receive device & sensors within 14 days per respresentative.  [name] will request PCP notes to conrfirm QID testing/injecting via fax. community pharmacy (faxed to [phone] on 4/29) . Collaboration with provider re: medication management  Patient Self Care Activities:  . Calls pharmacy for medication refills . Performs ADL's independently . Calls provider office for new concerns or questions  Please see past updates related to this goal by clicking on the "Past Updates" button in the selected goal      . I would like to control my blood sugars (pt-stated)       Current Barriers:  [name] Knowledge Deficits related to diabetes management . Non Adherence to prescribed medication regimen  Pharmacist Clinical Goal(s):  [name] Over the next 30 days, patient will demonstrate Improved medication adherence as evidenced by controlled BGs, taking medication as prescribed, checking BGs with new scanner glucometer  . Over the next 90 days, patient will strive to lower A1c towards goal ~7% (last A1c on 11/21/18 was 12).  Interventions: . Advised patient to continue checking BG with current meter.  It will take ~14 days for patient to receive new scanner glucometer [facility] [place])   . Counseled patient on DM healthy diet.  She states she is eating "better".  She states she is trying to control her carbohydrate intake, maintain protein and vegetable.  She states her BGs have been better than ever: FBG 123, other BGs throughout the day have been running in the low 200s (she states is good for her as she runs normally in 300s).    Patient Self Care Activities:  . Currently UNABLE TO independently meet diabetes goal.  She is improving! . Calls pharmacy for medication refills . Calls provider office for new concerns or questions  Initial goal documentation        The patient verbalized understanding of instructions provided today and declined a print copy of patient instruction materials.   The CM team will reach out to the patient again over the next 7 days.   Regina Eck, PharmD, BCPS Clinical Pharmacist, [facility] [place]: [phone]

## 2018-12-25 ENCOUNTER — Telehealth: Payer: Self-pay

## 2018-12-26 ENCOUNTER — Telehealth: Payer: Self-pay | Admitting: Pharmacist

## 2018-12-30 ENCOUNTER — Telehealth: Payer: Self-pay

## 2018-12-30 NOTE — Telephone Encounter (Signed)
Left pt v/m to call office to see if she can drop off a log of her blood sugar readings so [name] FNP-BC can fill out these forms we have received from [place]. YRL,RMA

## 2018-12-31 ENCOUNTER — Telehealth: Payer: Self-pay

## 2019-01-02 ENCOUNTER — Ambulatory Visit: Payer: Self-pay

## 2019-01-02 DIAGNOSIS — I10 Essential (primary) hypertension: Secondary | ICD-10-CM

## 2019-01-02 DIAGNOSIS — E1165 Type 2 diabetes mellitus with hyperglycemia: Secondary | ICD-10-CM

## 2019-01-02 DIAGNOSIS — R413 Other amnesia: Secondary | ICD-10-CM

## 2019-01-02 NOTE — Chronic Care Management (AMB) (Signed)
  Chronic Care Management   Social Work Note  01/02/2019 Name: [name] MRN: [id number] DOB: 1951-04-11  I received an incomming call from the patient regarding assistance with obtaining her glucometer. CCM SW briefly reviewed patient chart to determine CCM RN Case Manager has had difficulty establishing contact with the patient. CCM SW collaborated with CCM RN Case Freight forwarder and assisted with linking the patient to this care team member. See RN note for further information.  Follow Up Plan: No future SW follow up planned.  Daneen Schick, BSW, CDP [facility] / [facility] Care Management Social Worker [phone]  Total time spent performing care coordination and/or care management activities with the patient by phone or face to face = 10 minutes.

## 2019-01-02 NOTE — Patient Instructions (Signed)
Visit Information  Goals Addressed      Patient Stated   . "I have reoccuring UTI's" (pt-stated)       Current Barriers:  [name] Knowledge Deficits related to Urinary dysfunction and reoccurring UTI's  Nurse Case Manager Clinical Goal(s):  Over the next 60 days, patient will not experience any ED visits or hospital admissions related to infection.   Interventions:   Completed initial CCM RN telephone outreach with patient to establish her plan of care . Evaluation of current treatment plan related to recurrent UTI's and patient's adherence to plan as established by provider . Reviewed medications with patient and discussed current pharmacological treatment for UTI, including dosage and frequency . Discussed plans with patient for ongoing care management follow up and provided patient with direct contact information for care management team . Scheduled a CCM telephone follow up call with patient for about 2 weeks  Patient Self Care Activities:  . Self administers medications as prescribed . Attends all scheduled provider appointments . Calls pharmacy for medication refills . Performs ADL's independently . Performs IADL's independently . Calls provider office for new concerns or questions  Initial goal documentation     . "I know stress and infection can cause my sugars to run high" (pt-stated)       Current Barriers:  [name] Knowledge Deficits related to Diabetes disease process and Self Health Management   Nurse Case Manager Clinical Goal(s):  [name] Over the next 60 days, patient will work with the CCM team to address needs related to improved Diabetes management as evidence by patient will have improved CBG's (no BS<80 or >250) and 100 % adherence with following her Diabetes treatment plan.   Interventions:   Completed initial CCM RN telephone call with patient to establish her plan of care  Evaluation of current treatment plan related to Diabetes and patient's adherence to plan as  established by provider  Provided education to patient re: current A1C of 12.0; Provided education to patient re: target A1C of less than 7.0  Verbal education provided related to factors such as infection and or some medications may increase BS  Collaborated with embedded Pharm D Lottie Dawson regarding follow up with patient next week to update on Libre freestyle meter  Provided printed education to patient via mail related to meal planning using the plate method, knowing your A1C and signs and symptoms of hypo/hyperglycemia and how to manage at home (to be reviewed at next call)  Discussed plans with patient for ongoing care management follow up and provided patient with direct contact information for care management team  Provided patient with RN CM contact # and discussed hours of availability . Scheduled a CCM telephone follow up call for 2 weeks  Patient Self Care Activities:  . Self administers medications as prescribed . Attends all scheduled provider appointments . Calls pharmacy for medication refills . Performs ADL's independently . Performs IADL's independently . Calls provider office for new concerns or questions  Initial goal documentation     . "I would like a glucometer that scans my blood sugar-FreeStyle Libre" (pt-stated)       Current Barriers:  . Extremity soreness/irritation from traditional BG testing  . 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 [place] to provide on delivery status of Libre freestyle meter  Provided patient with RN CM contact # and discussed nurse availability   Patient Self Care Activities:  . Calls pharmacy for medication refills . Performs ADL's independently . Calls provider office for new concerns or questions  Please see past updates related to this goal by clicking on the "Past Updates" button in the selected goal         The patient verbalized understanding of instructions provided today and declined a print copy of patient instruction materials.   The CCM team will reach out to the patient again over the next 2 weeks.   [name], RN,CCM Care Management Coordinator [place] [facility]  Direct Phone: [phone]

## 2019-01-02 NOTE — Chronic Care Management (AMB) (Signed)
Chronic Care Management   Initial Visit Note  01/02/2019 Name: DEMETRESS TIFT MRN: [id number] DOB: 10-13-1950  Referred by: [name], MD Reason for referral : Chronic Care Management (INITIAL CCM RN Telephone Collaboration )   Elina Streng Horsford is a 68 y.o. year old female who is a primary care patient of [name], MD. The CCM team was consulted for assistance with chronic disease management and care coordination needs.   Review of patient status, including review of consultants reports, relevant laboratory and other test results, and collaboration with appropriate care team members and the patient's provider was performed as part of comprehensive patient evaluation and provision of chronic care management services.    I spoke with Ms. Cranfield by telephone today to establish her CCM plan of care.   Objective:  Lab Results  Component Value Date   HGBA1C 12.0 (H) 11/21/2018   HGBA1C 10.9 (H) 06/09/2018   HGBA1C 11.3 (A) 03/27/2018   Lab Results  Component Value Date   MICROALBUR 6.9 (H) 09/07/2016   LDLCALC 120 (H) 09/07/2016   CREATININE 1.31 (H) 06/09/2018   BP Readings from Last 3 Encounters:  11/20/18 (!) 175/90  10/03/18 (!) 179/84  10/01/18 (!) 201/80    Goals Addressed      Patient Stated    "I have reoccuring UTI's" (pt-stated)       Current Barriers:   Knowledge Deficits related to Urinary dysfunction and reoccurring UTI's  Nurse Case Manager Clinical Goal(s):  Over the next 60 days, patient will not experience any ED visits or hospital admissions related to infection.   Interventions:   Completed initial CCM RN telephone outreach with patient to establish her plan of care  Evaluation of current treatment plan related to recurrent UTI's and patient's adherence to plan as established by provider  Reviewed medications with patient and discussed current pharmacological treatment for UTI, including dosage and frequency  Discussed plans with  patient for ongoing care management follow up and provided patient with direct contact information for care management team  Scheduled a CCM telephone follow up call with patient for about 2 weeks  Patient Self Care Activities:   Self administers medications as prescribed  Attends all scheduled provider appointments  Calls pharmacy for medication refills  Performs ADL's independently  Performs IADL's independently  Calls provider office for new concerns or questions  Initial goal documentation      "I would like a glucometer that scans my blood sugar-FreeStyle Libre" (pt-stated)       Current Barriers:   Extremity soreness/irritation from traditional BG testing   Financial Barriers-glucometer denied at [employer] of insurance coverage  Pharmacist Clinical Goal(s):   Over the next 14 days, patient will work with CCM Pharmacist to address needs related to obtaining CGM FreeStyle Libre  Interventions:  Inbound call from patient, transferred by [employer]  Patient has been unavailable to reach due to broken cell phone-this has been resolved  Discussed status of Libre freestyle meter; patient updated on status  Sent in basket message to [place] to confirm patient's blood sugar log was received   Message sent to embedded Pharm D requesting she contact Ms. Longanecker to provide on delivery status of Libre freestyle meter  Provided patient with RN CM contact # and discussed nurse availability   Patient Self Care Activities:   Calls pharmacy for medication refills  Performs ADL's independently  Calls provider office for new concerns or questions  Please see past updates related to  this goal by clicking on the "Past Updates" button in the selected goal            "I know stress and infection can cause my sugars to run high"       Current Barriers:   Knowledge Deficits related to Diabetes disease process and Self Health Management    Nurse Case Manager Clinical Goal(s):   Over the next 60 days, patient will work with the CCM team to address needs related to improved Diabetes management as evidence by patient will have improved CBG's (no BS<80 or >250) and 100 % adherence with following her Diabetes treatment plan.   Interventions:   Completed initial CCM RN telephone call with patient to establish her plan of care  Evaluation of current treatment plan related to Diabetes and patient's adherence to plan as established by provider  Provided education to patient re: current A1C of 12.0; Provided education to patient re: target A1C of less than 7.0  Verbal education provided related to factors such as infection and or some medications may increase BS  Collaborated with embedded Pharm D Lottie Dawson regarding follow up with patient next week to update on Libre freestyle meter  Provided printed education to patient via mail related to meal planning using the plate method, knowing your A1C and signs and symptoms of hypo/hyperglycemia and how to manage at home (to be reviewed at next call)  Discussed plans with patient for ongoing care management follow up and provided patient with direct contact information for care management team  Provided patient with RN CM contact # and discussed hours of availability  Scheduled a CCM telephone follow up call for 2 weeks  Patient Self Care Activities:   Self administers medications as prescribed  Attends all scheduled provider appointments  Calls pharmacy for medication refills  Performs ADL's independently  Performs IADL's independently  Calls provider office for new concerns or questions  Initial goal documentation        RN CM will follow up with patient again by telephone in about 2 weeks.   [name], RN,CCM Care Management Coordinator [place] [facility]  Direct Phone: [phone]

## 2019-01-08 ENCOUNTER — Ambulatory Visit (INDEPENDENT_AMBULATORY_CARE_PROVIDER_SITE_OTHER): Payer: Medicare Other | Admitting: Pharmacist

## 2019-01-08 DIAGNOSIS — E1165 Type 2 diabetes mellitus with hyperglycemia: Secondary | ICD-10-CM

## 2019-01-08 DIAGNOSIS — I10 Essential (primary) hypertension: Secondary | ICD-10-CM | POA: Diagnosis not present

## 2019-01-08 NOTE — Patient Instructions (Addendum)
Visit Information  Goals Addressed            This Visit's Progress     Patient Stated   . "I would like a glucometer that scans my blood sugar-FreeStyle Libre" (pt-stated)       Current Barriers:  . Extremity soreness/irritation from traditional BG testing  . 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  I will company again on Monday 01/12/19  Sent in basket message to [place] & PCP to confirm patient's blood sugar log was received & addendum was faxed  Addendum sent to [facility] confirming patient is testing 4-6 times daily per [name], DNP, FNP.  Provided patient with CCM team #'s  Patient should receive Libre glucometer around May 23rd  Will continue to follow patient to ensure meter obtained.  Patient Self Care Activities:  . Calls pharmacy for medication refills . Performs ADL's independently . Calls provider office for new concerns or questions  Please see past updates related to this goal by clicking on the "Past Updates" button in the selected goal      . I would like to control my blood sugars (pt-stated)       Current Barriers:  [name] Knowledge Deficits related to diabetes management . Non Adherence to prescribed medication regimen  Pharmacist Clinical Goal(s):  [name] Over the next 30 days, patient will demonstrate Improved medication adherence as evidenced by controlled BGs, taking medication as prescribed, checking BGs with new scanner glucometer  . Over the next 90 days, patient will strive to lower A1c towards goal (last A1c on 11/21/18 was 12).  Interventions: . Advised patient to continue checking BG with current meter.  It will take ~20 days for patient to receive  new scanner glucometer [facility] [place]). Called [facility]/Libre Team x3 to check on progress-->anticipated ship date of 5.23.20 . Counseled patient on DM healthy diet.  She states she is eating "better".  She states she is trying to control her carbohydrate intake, maintain protein and vegetable.  Encouraged  She states her BGs continue to improve and she is trying to abide by DM healthy diet.  She states her FBG have been 130-170.  This is an improvement from 190-200s.  She is looking forward to diabetes education materials given by CCM RN [employer].  Patient Self Care Activities:  . Currently UNABLE TO independently meet diabetes goal.  She is improving! . Calls pharmacy for medication refills . Calls provider office for new concerns or questions  Please see past updates related to this goal by clicking on the "Past Updates" button in the selected goal         The patient verbalized understanding of instructions provided today and declined a print copy of patient instruction materials.   The CM team will reach out to the patient again over the next 7 days.   Regina Eck, PharmD, BCPS Clinical Pharmacist, [facility] [place]: [phone]

## 2019-01-09 ENCOUNTER — Telehealth: Payer: Self-pay

## 2019-01-09 NOTE — Progress Notes (Signed)
Chronic Care Management   Visit Note  01/08/2019 Name: [name] MRN: [id number] DOB: 1951-03-24  Referred by: [name], MD Reason for referral : Chronic Care Management   [name] is a 68 y.o. year old female who is a primary care patient of [name], MD. The CCM team was consulted for assistance with chronic disease management and care coordination needs.   Review of patient status, including review of consultants reports, relevant laboratory and other test results, and collaboration with appropriate care team members and the patient's provider was performed as part of comprehensive patient evaluation and provision of chronic care management services.    I spoke with [name] by telephone today.  Objective:   Goals Addressed            This Visit's Progress     Patient Stated   . "I would like a glucometer that scans my blood sugar-FreeStyle Libre" (pt-stated)       Current Barriers:  . Extremity soreness/irritation from traditional BG testing  . 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  I will company again on Monday 01/12/19  Sent in basket message to [place] & PCP to confirm patient's blood sugar log was received & addendum was faxed  Addendum sent to [facility] confirming patient is testing 4-6 times daily per [name], DNP, FNP.  Provided patient with CCM team #'s  Patient should receive Libre glucometer around May 23rd  Will continue to follow patient to ensure meter obtained.  Patient Self Care Activities:  . Calls pharmacy for medication refills . Performs ADL's independently . Calls provider office for new concerns or  questions  Please see past updates related to this goal by clicking on the "Past Updates" button in the selected goal      . I would like to control my blood sugars (pt-stated)       Current Barriers:  [name] Knowledge Deficits related to diabetes management . Non Adherence to prescribed medication regimen  Pharmacist Clinical Goal(s):  [name] Over the next 30 days, patient will demonstrate Improved medication adherence as evidenced by controlled BGs, taking medication as prescribed, checking BGs with new scanner glucometer  . Over the next 90 days, patient will strive to lower A1c towards goal (last A1c on 11/21/18 was 12).  Interventions: . Advised patient to continue checking BG with current meter.  It will take ~20 days for patient to receive new scanner glucometer [facility] [place]). Called [facility]/Libre Team x3 to check on progress-->anticipated ship date of 5.23.20 . Counseled patient on DM healthy diet.  She states she is eating "better".  She states she is trying to control her carbohydrate intake, maintain protein and vegetable.  Encouraged  She states her BGs continue to improve and she is trying to abide by DM healthy diet.  She states her FBG have been 130-170.  This is an improvement from 190-200s.  She is looking forward to diabetes education materials given by CCM RN [employer].  Patient Self Care Activities:  . Currently UNABLE TO independently meet diabetes goal.  She is improving! . Calls pharmacy for medication refills . Calls provider office for new concerns or questions  Please see past updates related to this goal  by clicking on the "Past Updates" button in the selected goal         Plan:  - I will follow up with [place] on Monday -The CM team will reach out to the patient again over the next 5 days.   Regina Eck, PharmD, BCPS Clinical Pharmacist, [facility] [place]:  [phone]

## 2019-01-12 ENCOUNTER — Ambulatory Visit: Payer: Self-pay

## 2019-01-12 ENCOUNTER — Telehealth: Payer: Self-pay

## 2019-01-12 DIAGNOSIS — I10 Essential (primary) hypertension: Secondary | ICD-10-CM

## 2019-01-12 DIAGNOSIS — R413 Other amnesia: Secondary | ICD-10-CM

## 2019-01-12 DIAGNOSIS — E1165 Type 2 diabetes mellitus with hyperglycemia: Secondary | ICD-10-CM

## 2019-01-12 NOTE — Chronic Care Management (AMB) (Signed)
  Chronic Care Management   Follow Up Note   01/12/2019 Name: [name] MRN: [id number] DOB: Apr 11, 1951  Referred by: [name], MD Reason for referral : Chronic Care Management (CCM RN Telephone Follow Up)   [name] is a 68 y.o. year old female who is a primary care patient of [name], MD. The CCM team was consulted for assistance with chronic disease management and care coordination needs.    Review of patient status, including review of consultants reports, relevant laboratory and other test results, and collaboration with appropriate care team members and the patient's provider was performed as part of comprehensive patient evaluation and provision of chronic care management services.    I received an inbound call from Ms. Gathright today requesting an update on the status of her freestyle meter.   Goals Addressed      Patient Stated   . "I would like a glucometer that scans my blood sugar-FreeStyle Libre" (pt-stated)       Current Barriers:  . Extremity soreness/irritation from traditional BG testing  . 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  I will company again on Monday 01/12/19  Sent in basket message to [place] & PCP to confirm patient's blood sugar log was received & addendum was faxed  Addendum sent to [facility] confirming patient is testing 4-6 times daily per [name], DNP, FNP.  Provided patient with CCM team #'s  Patient  should receive Libre glucometer around May 23rd  Will continue to follow patient to ensure meter obtained.  Patient Self Care Activities:  . Calls pharmacy for medication refills . Performs ADL's independently . Calls provider office for new concerns or questions  Please see past updates related to this goal by clicking on the "Past Updates" button in the selected goal        Telephone follow up appointment with CCM team member scheduled for: 01/16/19   [name], [facility] Care Management Coordinator [place] [facility]  Direct Phone: [phone]

## 2019-01-12 NOTE — Patient Instructions (Signed)
Visit Information  Goals Addressed            This Visit's Progress     Patient Stated   . "I would like a glucometer that scans my blood sugar-FreeStyle Libre" (pt-stated)       Current Barriers:  . Extremity soreness/irritation from traditional BG testing  . 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  I will company again on Monday 01/12/19  Sent in basket message to [place] & PCP to confirm patient's blood sugar log was received & addendum was faxed  Addendum sent to [facility] confirming patient is testing 4-6 times daily per [name], DNP, FNP.  Provided patient with CCM team #'s  Patient should receive Libre glucometer around May 23rd  Will continue to follow patient to ensure meter obtained.  Patient Self Care Activities:  . Calls pharmacy for medication refills . Performs ADL's independently . Calls provider office for new concerns or questions  Please see past updates related to this goal by clicking on the "Past Updates" button in the selected goal         The patient verbalized understanding of instructions provided today and declined a print copy of patient instruction materials.   Telephone follow up appointment with CCM team member scheduled for: 01/16/19  [name], [facility] Care Management Coordinator [place] [facility]  Direct Phone: [phone]

## 2019-01-13 ENCOUNTER — Ambulatory Visit: Payer: Self-pay | Admitting: Pharmacist

## 2019-01-13 DIAGNOSIS — E1165 Type 2 diabetes mellitus with hyperglycemia: Secondary | ICD-10-CM | POA: Diagnosis not present

## 2019-01-13 DIAGNOSIS — I10 Essential (primary) hypertension: Secondary | ICD-10-CM | POA: Diagnosis not present

## 2019-01-14 ENCOUNTER — Other Ambulatory Visit: Payer: Self-pay | Admitting: Nurse Practitioner

## 2019-01-14 DIAGNOSIS — E1165 Type 2 diabetes mellitus with hyperglycemia: Secondary | ICD-10-CM

## 2019-01-15 NOTE — Patient Instructions (Signed)
Visit Information  Goals Addressed            This Visit's Progress     Patient Stated   . "I would like a glucometer that scans my blood sugar-FreeStyle Libre" (pt-stated)       Current Barriers:  . Extremity soreness/irritation from traditional BG testing  . 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 (in-progress)  Interventions:  Inbound call received from patient   CCM RN-->Answered questions regarding estimated time of arrival for [employer]; provided patient with the update listed below per update noted by Gaspar Garbe D   Discussed status of Libre freestyle meter; patient updated on status.  I will company again on Friday 01/16/19 as [name] takes 48hrs to review new documents  Repeat addendum sent to [facility] confirming patient is testing 4-6 times daily AND injecting >3x daily. per [name], DNP, FNP.   Patient should receive Libre glucometer around May 26th due to holiday if paperwork accepted.  She is motivated  Will continue to follow patient to ensure meter obtained.  Patient Self Care Activities:  . Calls pharmacy for medication refills . Performs ADL's independently . Calls provider office for new concerns or questions  Please see past updates related to this goal by clicking on the "Past Updates" button in the selected goal      . I would like to control my blood sugars (pt-stated)       Current Barriers:  [name] Knowledge Deficits related to diabetes management . Non Adherence to prescribed medication regimen  Pharmacist Clinical Goal(s):  [name] Over the next 30 days, patient will demonstrate Improved medication adherence as evidenced by controlled BGs, taking medication as prescribed, checking BGs with new scanner glucometer  . Over the next 90  days, patient will strive to lower A1c towards goal (last A1c on 11/21/18 was 12).  Interventions: . Advised patient to continue checking BG with current meter.  It will take ~20 days for patient to receive new scanner glucometer [facility] [place]). Called [facility]/Libre Team to check on progress-->anticipated ship date -->week of 5.26.20 . Counseled patient on DM healthy diet.  She states she is eating "better".  She states she is trying to control her carbohydrate intake, maintain protein and vegetable.  Encouraged  She states her BGs continue to improve and she is trying to abide by DM healthy diet.  She states her FBG have been 130-170s.  This is an improvement from 190-200s.  She is looking forward to diabetes education materials given by CCM RN [employer].  Patient Self Care Activities:  . Currently UNABLE TO independently meet diabetes goal.  She is improving! . Calls pharmacy for medication refills . Calls provider office for new concerns or questions  Please see past updates related to this goal by clicking on the "Past Updates" button in the selected goal         The patient verbalized understanding of instructions provided today and declined a print copy of patient instruction materials.   The CM team will reach out to the patient again over the next 5 days.   Regina Eck, PharmD, BCPS Clinical Pharmacist, [facility] [place]: [phone]

## 2019-01-15 NOTE — Progress Notes (Signed)
Chronic Care Management   Visit Note  01/13/2019 Name: [name] MRN: [id number] DOB: 04-24-1951  Referred by: [name], MD Reason for referral : Chronic Care Management   [name] is a 68 y.o. year old female who is a primary care patient of [name], MD. The CCM team was consulted for assistance with chronic disease management and care coordination needs.   Review of patient status, including review of consultants reports, relevant laboratory and other test results, and collaboration with appropriate care team members and the patient's provider was performed as part of comprehensive patient evaluation and provision of chronic care management services.    I spoke with [name] by telephone today.  Objective:   Goals Addressed            This Visit's Progress     Patient Stated   . "I would like a glucometer that scans my blood sugar-FreeStyle Libre" (pt-stated)       Current Barriers:  . Extremity soreness/irritation from traditional BG testing  . 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 (in-progress)  Interventions:  Inbound call received from patient   CCM RN-->Answered questions regarding estimated time of arrival for [employer]; provided patient with the update listed below per update noted by Gaspar Garbe D   Discussed status of Libre freestyle meter; patient updated on status.  I will company again on Friday 01/16/19 as [name] takes 48hrs to review new documents  Repeat addendum sent to [facility] confirming patient is testing 4-6 times daily AND injecting >3x daily. per [name], DNP, FNP.   Patient should receive Libre glucometer around May 26th due to holiday if paperwork accepted.  She is motivated  Will  continue to follow patient to ensure meter obtained.  Patient Self Care Activities:  . Calls pharmacy for medication refills . Performs ADL's independently . Calls provider office for new concerns or questions  Please see past updates related to this goal by clicking on the "Past Updates" button in the selected goal      . I would like to control my blood sugars (pt-stated)       Current Barriers:  [name] Knowledge Deficits related to diabetes management . Non Adherence to prescribed medication regimen  Pharmacist Clinical Goal(s):  [name] Over the next 30 days, patient will demonstrate Improved medication adherence as evidenced by controlled BGs, taking medication as prescribed, checking BGs with new scanner glucometer  . Over the next 90 days, patient will strive to lower A1c towards goal (last A1c on 11/21/18 was 12).  Interventions: . Advised patient to continue checking BG with current meter.  It will take ~20 days for patient to receive new scanner glucometer [facility]). Called [facility]/Libre Team to check on progress-->anticipated ship date -->week of 5.26.20 . Counseled patient on DM healthy diet.  She states she is eating "better".  She states she is trying to control her carbohydrate intake, maintain protein and vegetable.  Encouraged  She states her BGs continue to improve and she is trying to abide by DM healthy diet.  She states her FBG have been 130-170s.  This is an improvement from 190-200s.  She is looking forward to diabetes education materials given by CCM RN [employer].  Patient Self Care Activities:  . Currently UNABLE TO independently  meet diabetes goal.  She is improving! . Calls pharmacy for medication refills . Calls provider office for new concerns or questions  Please see past updates related to this goal by clicking on the "Past Updates" button in the selected goal         Plan:   The CM team will reach out to the patient again over the next 5  days.    Regina Eck, PharmD, BCPS Clinical Pharmacist, [facility] [place]: [phone]

## 2019-01-16 ENCOUNTER — Telehealth: Payer: Self-pay

## 2019-01-16 DIAGNOSIS — E118 Type 2 diabetes mellitus with unspecified complications: Secondary | ICD-10-CM | POA: Diagnosis not present

## 2019-01-16 DIAGNOSIS — Z794 Long term (current) use of insulin: Secondary | ICD-10-CM | POA: Diagnosis not present

## 2019-01-23 ENCOUNTER — Ambulatory Visit: Payer: Self-pay | Admitting: Pharmacist

## 2019-01-23 DIAGNOSIS — E1165 Type 2 diabetes mellitus with hyperglycemia: Secondary | ICD-10-CM

## 2019-01-23 DIAGNOSIS — I10 Essential (primary) hypertension: Secondary | ICD-10-CM

## 2019-01-23 NOTE — Progress Notes (Signed)
Chronic Care Management   Visit Note  01/23/2019 Name: [name] MRN: [id number] DOB: 08-03-1951  Referred by: [name], MD Reason for referral : Chronic Care Management   [name] is a 68 y.o. year old female who is a primary care patient of [name], MD. The CCM team was consulted for assistance with chronic disease management and care coordination needs.   Review of patient status, including review of consultants reports, relevant laboratory and other test results, and collaboration with appropriate care team members and the patient's provider was performed as part of comprehensive patient evaluation and provision of chronic care management services.    I spoke with [name] by telephone yesterday.  Objective:   Goals Addressed            This Visit's Progress     Patient Stated   . COMPLETED: "I would like a glucometer that scans my blood sugar-FreeStyle Libre" (pt-stated)       Current Barriers:  . Extremity soreness/irritation from traditional BG testing  . 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 (in-progress)  Interventions:  Inbound call received from patient stating she received her Freestyle glucometer!! She is thankful for the assistance.  Instructed patient to call if set up help is needed or to schedule appt with CCM PharmD on Wednesdays if needed.  Patient motivated to continue taking medications as prescribed, improve diet and check BGs as directed.  Patient Self Care Activities:  . Calls pharmacy for medication refills . Performs ADL's independently . Calls provider office for new concerns or questions  Please see past updates related to this goal by clicking on the "Past Updates" button in the selected goal      . I would like  to control my blood sugars (pt-stated)       Current Barriers:  [name] Knowledge Deficits related to diabetes management . Non Adherence to prescribed medication regimen  Pharmacist Clinical Goal(s):  [name] Over the next 30 days, patient will demonstrate Improved medication adherence as evidenced by controlled BGs, taking medication as prescribed, checking BGs with new scanner glucometer  . Over the next 90 days, patient will strive to lower A1c towards goal (last A1c on 11/21/18 was 12).  Interventions: . Advised patient to continue checking BG with current meter.  It will take ~20 days for patient to receive new scanner glucometer [facility] [place]). New scanner gluocmeter delivered.  Patient needs to set up. . Continue to counsel patient on DM healthy diet.  She states she is eating "better".  She states she is trying to control her carbohydrate intake, maintain protein and vegetable. Reminded patient to reduce/avoid intake of starchy foods, bananas, etc. . She states her BGs continue to improve and she is trying to abide by DM healthy diet.  She states her FBG have been 130-170s.  This is an improvement from 190-200s (previously 300s).   Patient Self Care Activities:  . Currently UNABLE TO independently meet diabetes goal.  She is improving! . Calls pharmacy for medication refills . Calls provider office for new concerns or questions  Please see past updates related to this goal by clicking on the "Past Updates" button in the selected goal          Plan:   The care management team will reach out to the patient  again over the next 21 days.   Regina Eck, PharmD, BCPS Clinical Pharmacist, [facility] [place]: [phone]

## 2019-01-23 NOTE — Patient Instructions (Signed)
Visit Information  Goals Addressed            This Visit's Progress     Patient Stated   . COMPLETED: "I would like a glucometer that scans my blood sugar-FreeStyle Libre" (pt-stated)       Current Barriers:  . Extremity soreness/irritation from traditional BG testing  . 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 (in-progress)  Interventions:  Inbound call received from patient stating she received her Freestyle glucometer!! She is thankful for the assistance.  Instructed patient to call if set up help is needed or to schedule appt with CCM PharmD on Wednesdays if needed.  Patient motivated to continue taking medications as prescribed, improve diet and check BGs as directed.  Patient Self Care Activities:  . Calls pharmacy for medication refills . Performs ADL's independently . Calls provider office for new concerns or questions  Please see past updates related to this goal by clicking on the "Past Updates" button in the selected goal      . I would like to control my blood sugars (pt-stated)       Current Barriers:  [name] Knowledge Deficits related to diabetes management . Non Adherence to prescribed medication regimen  Pharmacist Clinical Goal(s):  [name] Over the next 30 days, patient will demonstrate Improved medication adherence as evidenced by controlled BGs, taking medication as prescribed, checking BGs with new scanner glucometer  . Over the next 90 days, patient will strive to lower A1c towards goal (last A1c on 11/21/18 was 12).  Interventions: . Advised patient to continue checking BG with current meter.  It will take ~20 days for patient to receive new scanner glucometer [facility] [place]). New scanner gluocmeter delivered.  Patient needs to set up. . Continue to counsel patient on DM  healthy diet.  She states she is eating "better".  She states she is trying to control her carbohydrate intake, maintain protein and vegetable. Reminded patient to reduce/avoid intake of starchy foods, bananas, etc. . She states her BGs continue to improve and she is trying to abide by DM healthy diet.  She states her FBG have been 130-170s.  This is an improvement from 190-200s (previously 300s).   Patient Self Care Activities:  . Currently UNABLE TO independently meet diabetes goal.  She is improving! . Calls pharmacy for medication refills . Calls provider office for new concerns or questions  Please see past updates related to this goal by clicking on the "Past Updates" button in the selected goal         The patient verbalized understanding of instructions provided today and declined a print copy of patient instruction materials.   The care management team will reach out to the patient again over the next 21 days.   Regina Eck, PharmD, BCPS Clinical Pharmacist, [facility] [place]: [phone]

## 2019-01-30 DIAGNOSIS — N3020 Other chronic cystitis without hematuria: Secondary | ICD-10-CM | POA: Diagnosis not present

## 2019-02-09 ENCOUNTER — Telehealth: Payer: Self-pay

## 2019-02-11 ENCOUNTER — Telehealth: Payer: Self-pay

## 2019-02-12 ENCOUNTER — Telehealth: Payer: Self-pay

## 2019-02-13 ENCOUNTER — Ambulatory Visit: Payer: Self-pay

## 2019-02-13 NOTE — Progress Notes (Signed)
  Chronic Care Management   Outreach Note  02/13/2019 Name: [name] MRN: [id number] DOB: 1951-08-04  Referred by: [name], MD Reason for referral : Chronic Care Management   An unsuccessful telephone outreach was attempted today. HIPAA compliant message left requesting call back.  Of note, patient has received her FreeStyle Libre scanner glucometer and has began using.  The patient was referred to the case management team by for assistance with chronic care management and care coordination.   Follow Up Plan: The care management team will reach out to the patient again over the next 3-5 business days.    Regina Eck, PharmD, BCPS Clinical Pharmacist, [facility] [place]: [phone]

## 2019-02-16 DIAGNOSIS — Z794 Long term (current) use of insulin: Secondary | ICD-10-CM | POA: Diagnosis not present

## 2019-02-16 DIAGNOSIS — E118 Type 2 diabetes mellitus with unspecified complications: Secondary | ICD-10-CM | POA: Diagnosis not present

## 2019-02-17 ENCOUNTER — Ambulatory Visit (INDEPENDENT_AMBULATORY_CARE_PROVIDER_SITE_OTHER): Payer: Medicare Other | Admitting: Pharmacist

## 2019-02-17 DIAGNOSIS — I10 Essential (primary) hypertension: Secondary | ICD-10-CM | POA: Diagnosis not present

## 2019-02-17 DIAGNOSIS — E1165 Type 2 diabetes mellitus with hyperglycemia: Secondary | ICD-10-CM

## 2019-02-17 NOTE — Patient Instructions (Signed)
Visit Information  Goals Addressed            This Visit's Progress     Patient Stated   . I want to control my diabetes (pt-stated)       Current Barriers:  [name] Knowledge Deficits related to diabetes diet & lifestyle modifications . Non Adherence to prescribed medication regimen  Pharmacist Clinical Goal(s):  [name] Over the next 90 days, patient will work with PharmD and PCP to address needs related to optimized medication management of chronic conditions  Interventions: . Comprehensive medication review performed.  Reviewed medication fill history via insurance claims data. . Reviewed & discussed the following diabetes-related information with patient: o Continue checking blood sugars as directed-->patient now has scanner glucometer (FreeStyle [place]) o Confirmed current DM regimen: Humalog 25u breakfast, 35u lunch, 25u dinner; Ozempic 1mg  weekly o Reviewed medication purpose/side effects-->patient denies adverse events, denies hypoglycemia, reports FBG<200.  She reports her BG has not gotten above 200.  She has enjoyed scanning her arm for BG vs. Lancet method.   o Continue to follow ADA recommended "diabetes-friendly" diet  (reviewed healthy snack/food options).  Counseled patient to avoid sugary drinks and foods.  She is working to decrease potato & carbohydrates.  She is also able to walk more because she has had her bladder procedure o Most recent A1c is 12.0 (March 2020).  Reminded patient of upcoming PCP appt on 03/19/19 o Patient has not filled statin in 2020 per claims data-->will discuss at next appt. o Continue taking all medications as prescribed by provider  Patient Self Care Activities:  . Self administers medications as prescribed . Attends all scheduled provider appointments . Calls pharmacy for medication refills . Performs ADL's independently . Calls provider office for new concerns or questions  Initial goal documentation        The patient verbalized  understanding of instructions provided today and declined a print copy of patient instruction materials.   The care management team will reach out to the patient again next month.  Encouraged patient to call sooner if needs arise.  Regina Eck, PharmD, BCPS Clinical Pharmacist, [facility] [place]: [phone]

## 2019-02-17 NOTE — Progress Notes (Signed)
  Chronic Care Management   Visit Note  02/17/2019 Name: [name] MRN: [id number] DOB: 19-Jun-1951  Referred by: Gwendolyn Chard, MD Reason for referral : Chronic Care Management   [name] is a 68 y.o. year old female who is a primary care patient of Gwendolyn Chard, MD. The CCM team was consulted for assistance with chronic disease management and care coordination needs.   Review of patient status, including review of consultants reports, relevant laboratory and other test results, and collaboration with appropriate care team members and the patient's provider was performed as part of comprehensive patient evaluation and provision of chronic care management services.    I spoke with [name] by telephone today.  Objective:   Goals Addressed            This Visit's Progress     Patient Stated   . I want to control my diabetes (pt-stated)       Current Barriers:  [name] Knowledge Deficits related to diabetes diet & lifestyle modifications . Non Adherence to prescribed medication regimen  Pharmacist Clinical Goal(s):  [name] Over the next 90 days, patient will work with PharmD and PCP to address needs related to optimized medication management of chronic conditions  Interventions: . Comprehensive medication review performed.  Reviewed medication fill history via insurance claims data. . Reviewed & discussed the following diabetes-related information with patient: o Continue checking blood sugars as directed-->patient now has scanner glucometer (FreeStyle [place]) o Confirmed current DM regimen: Humalog 25u breakfast, 35u lunch, 25u dinner; Ozempic 1mg  weekly o Reviewed medication purpose/side effects-->patient denies adverse events, denies hypoglycemia, reports FBG<200.  She reports her BG has not gotten above 200.  She has enjoyed scanning her arm for BG vs. Lancet method.   o Continue to follow ADA recommended "diabetes-friendly" diet  (reviewed healthy snack/food  options).  Counseled patient to avoid sugary drinks and foods.  She is working to decrease potato & carbohydrates.  She is also able to walk more because she has had her bladder procedure o Most recent A1c is 12.0 (March 2020).  Reminded patient of upcoming PCP appt on 03/19/19 o Patient has not filled statin in 2020 per claims data (no allergy listed)-->will discuss at next appt. o Continue taking all medications as prescribed by provider  Patient Self Care Activities:  . Self administers medications as prescribed . Attends all scheduled provider appointments . Calls pharmacy for medication refills . Performs ADL's independently . Calls provider office for new concerns or questions  Initial goal documentation       Plan:   The care management team will reach out to the patient again next month.  Regina Eck, PharmD, BCPS Clinical Pharmacist, [facility] [place]: [phone]

## 2019-02-18 ENCOUNTER — Telehealth: Payer: Self-pay

## 2019-02-22 ENCOUNTER — Other Ambulatory Visit: Payer: Self-pay | Admitting: Nurse Practitioner

## 2019-02-22 DIAGNOSIS — E1165 Type 2 diabetes mellitus with hyperglycemia: Secondary | ICD-10-CM

## 2019-02-22 DIAGNOSIS — I10 Essential (primary) hypertension: Secondary | ICD-10-CM

## 2019-02-23 ENCOUNTER — Telehealth: Payer: Self-pay

## 2019-03-06 ENCOUNTER — Inpatient Hospital Stay (HOSPITAL_COMMUNITY): Payer: Medicare Other

## 2019-03-06 ENCOUNTER — Other Ambulatory Visit: Payer: Self-pay

## 2019-03-06 ENCOUNTER — Inpatient Hospital Stay (HOSPITAL_COMMUNITY): Payer: Medicare Other | Admitting: Certified Registered Nurse Anesthetist

## 2019-03-06 ENCOUNTER — Encounter (HOSPITAL_COMMUNITY)
Admission: RE | Disposition: A | Discharge status: Home / Self Care | Payer: Self-pay | Source: Ambulatory Visit | Attending: Urology

## 2019-03-06 ENCOUNTER — Observation Stay (HOSPITAL_COMMUNITY)
Admission: RE | Admit: 2019-03-06 | Discharge: 2019-03-07 | Disposition: A | Discharge status: Home / Self Care | Payer: Medicare Other | Source: Ambulatory Visit | Attending: Urology | Admitting: Urology

## 2019-03-06 ENCOUNTER — Other Ambulatory Visit (HOSPITAL_COMMUNITY)
Admission: RE | Admit: 2019-03-06 | Discharge: 2019-03-06 | Disposition: A | Discharge status: Home / Self Care | Payer: Medicare Other | Source: Ambulatory Visit | Attending: Urology | Admitting: Urology

## 2019-03-06 ENCOUNTER — Other Ambulatory Visit: Payer: Self-pay | Admitting: Urology

## 2019-03-06 ENCOUNTER — Encounter (HOSPITAL_COMMUNITY): Payer: Self-pay | Admitting: *Deleted

## 2019-03-06 ENCOUNTER — Observation Stay (HOSPITAL_COMMUNITY): Payer: Medicare Other

## 2019-03-06 ENCOUNTER — Other Ambulatory Visit (HOSPITAL_COMMUNITY): Payer: Self-pay | Admitting: *Deleted

## 2019-03-06 DIAGNOSIS — T85734A Infection and inflammatory reaction due to implanted electronic neurostimulator, generator, initial encounter: Principal | ICD-10-CM | POA: Insufficient documentation

## 2019-03-06 DIAGNOSIS — K219 Gastro-esophageal reflux disease without esophagitis: Secondary | ICD-10-CM | POA: Diagnosis not present

## 2019-03-06 DIAGNOSIS — Z1159 Encounter for screening for other viral diseases: Secondary | ICD-10-CM | POA: Insufficient documentation

## 2019-03-06 DIAGNOSIS — T85840A Pain due to nervous system prosthetic devices, implants and grafts, initial encounter: Secondary | ICD-10-CM | POA: Insufficient documentation

## 2019-03-06 DIAGNOSIS — N183 Chronic kidney disease, stage 3 (moderate): Secondary | ICD-10-CM | POA: Diagnosis not present

## 2019-03-06 DIAGNOSIS — Z8249 Family history of ischemic heart disease and other diseases of the circulatory system: Secondary | ICD-10-CM | POA: Insufficient documentation

## 2019-03-06 DIAGNOSIS — Y753 Surgical instruments, materials and neurological devices (including sutures) associated with adverse incidents: Secondary | ICD-10-CM | POA: Insufficient documentation

## 2019-03-06 DIAGNOSIS — Z794 Long term (current) use of insulin: Secondary | ICD-10-CM | POA: Insufficient documentation

## 2019-03-06 DIAGNOSIS — Z7951 Long term (current) use of inhaled steroids: Secondary | ICD-10-CM | POA: Insufficient documentation

## 2019-03-06 DIAGNOSIS — I10 Essential (primary) hypertension: Secondary | ICD-10-CM | POA: Insufficient documentation

## 2019-03-06 DIAGNOSIS — Z91018 Allergy to other foods: Secondary | ICD-10-CM | POA: Insufficient documentation

## 2019-03-06 DIAGNOSIS — Z79899 Other long term (current) drug therapy: Secondary | ICD-10-CM | POA: Diagnosis not present

## 2019-03-06 DIAGNOSIS — Z885 Allergy status to narcotic agent status: Secondary | ICD-10-CM | POA: Insufficient documentation

## 2019-03-06 DIAGNOSIS — N3020 Other chronic cystitis without hematuria: Secondary | ICD-10-CM | POA: Insufficient documentation

## 2019-03-06 DIAGNOSIS — L0390 Cellulitis, unspecified: Secondary | ICD-10-CM | POA: Diagnosis not present

## 2019-03-06 DIAGNOSIS — I129 Hypertensive chronic kidney disease with stage 1 through stage 4 chronic kidney disease, or unspecified chronic kidney disease: Secondary | ICD-10-CM | POA: Diagnosis not present

## 2019-03-06 DIAGNOSIS — E119 Type 2 diabetes mellitus without complications: Secondary | ICD-10-CM | POA: Insufficient documentation

## 2019-03-06 DIAGNOSIS — E7800 Pure hypercholesterolemia, unspecified: Secondary | ICD-10-CM | POA: Diagnosis not present

## 2019-03-06 DIAGNOSIS — K449 Diaphragmatic hernia without obstruction or gangrene: Secondary | ICD-10-CM | POA: Insufficient documentation

## 2019-03-06 DIAGNOSIS — T85733A Infection and inflammatory reaction due to implanted electronic neurostimulator of spinal cord, electrode (lead), initial encounter: Secondary | ICD-10-CM | POA: Diagnosis not present

## 2019-03-06 DIAGNOSIS — E1122 Type 2 diabetes mellitus with diabetic chronic kidney disease: Secondary | ICD-10-CM | POA: Diagnosis not present

## 2019-03-06 DIAGNOSIS — Z4689 Encounter for fitting and adjustment of other specified devices: Secondary | ICD-10-CM | POA: Diagnosis not present

## 2019-03-06 DIAGNOSIS — Z419 Encounter for procedure for purposes other than remedying health state, unspecified: Secondary | ICD-10-CM

## 2019-03-06 DIAGNOSIS — L089 Local infection of the skin and subcutaneous tissue, unspecified: Secondary | ICD-10-CM | POA: Diagnosis present

## 2019-03-06 HISTORY — PX: INTERSTIM IMPLANT REMOVAL: SHX5131

## 2019-03-06 LAB — CBC
HCT: 42.7 % (ref 36.0–46.0)
Hemoglobin: 13.9 g/dL (ref 12.0–15.0)
MCH: 30.4 pg (ref 26.0–34.0)
MCHC: 32.6 g/dL (ref 30.0–36.0)
MCV: 93.4 fL (ref 80.0–100.0)
Platelets: 286 10*3/uL (ref 150–400)
RBC: 4.57 MIL/uL (ref 3.87–5.11)
RDW: 11.7 % (ref 11.5–15.5)
WBC: 10.6 10*3/uL — ABNORMAL HIGH (ref 4.0–10.5)
nRBC: 0 % (ref 0.0–0.2)

## 2019-03-06 LAB — GLUCOSE, CAPILLARY
Glucose-Capillary: 173 mg/dL — ABNORMAL HIGH (ref 70–99)
Glucose-Capillary: 164 mg/dL — ABNORMAL HIGH (ref 70–99)
Glucose-Capillary: 160 mg/dL — ABNORMAL HIGH (ref 70–99)

## 2019-03-06 LAB — BASIC METABOLIC PANEL
Anion gap: 16 — ABNORMAL HIGH (ref 5–15)
BUN: 17 mg/dL (ref 8–23)
CO2: 27 mmol/L (ref 22–32)
Calcium: 9.2 mg/dL (ref 8.9–10.3)
Chloride: 95 mmol/L — ABNORMAL LOW (ref 98–111)
Creatinine, Ser: 1.19 mg/dL — ABNORMAL HIGH (ref 0.44–1.00)
GFR calc Af Amer: 55 mL/min — ABNORMAL LOW (ref >60)
GFR calc non Af Amer: 48 mL/min — ABNORMAL LOW (ref >60)
Glucose, Bld: 164 mg/dL — ABNORMAL HIGH (ref 70–99)
Potassium: 3.2 mmol/L — ABNORMAL LOW (ref 3.5–5.1)
Sodium: 138 mmol/L (ref 135–145)

## 2019-03-06 LAB — SARS CORONAVIRUS 2 BY RT PCR (HOSPITAL ORDER, PERFORMED IN ~~LOC~~ HOSPITAL LAB): SARS Coronavirus 2: NEGATIVE

## 2019-03-06 SURGERY — REMOVAL, NEUROSTIMULATOR, SACRAL
Anesthesia: Monitor Anesthesia Care

## 2019-03-06 MED ORDER — ENSURE ENLIVE PO LIQD
237.0000 mL | Freq: Two times a day (BID) | ORAL | Status: DC
  Administered 2019-03-07: 237 mL via ORAL

## 2019-03-06 MED ORDER — AMLODIPINE BESYLATE 10 MG PO TABS
10.0000 mg | ORAL_TABLET | Freq: Every day | ORAL | Status: DC
  Administered 2019-03-07: 10 mg via ORAL
  Filled 2019-03-06: qty 1

## 2019-03-06 MED ORDER — LIDOCAINE-EPINEPHRINE (PF) 1 %-1:200000 IJ SOLN
INTRAMUSCULAR | Status: DC | PRN
  Administered 2019-03-06: 15 mL

## 2019-03-06 MED ORDER — SODIUM CHLORIDE 0.9 % IV SOLN
INTRAVENOUS | Status: DC | PRN
  Administered 2019-03-06: 500 mL

## 2019-03-06 MED ORDER — LIDOCAINE 2% (20 MG/ML) 5 ML SYRINGE
INTRAMUSCULAR | Status: AC
  Filled 2019-03-06: qty 5

## 2019-03-06 MED ORDER — ONDANSETRON HCL 4 MG PO TABS: 4.0000 mg | ORAL_TABLET | Freq: Three times a day (TID) | ORAL | Status: DC | PRN

## 2019-03-06 MED ORDER — DOCUSATE SODIUM 100 MG PO CAPS: 100.0000 mg | ORAL_CAPSULE | Freq: Two times a day (BID) | ORAL | Status: DC | PRN

## 2019-03-06 MED ORDER — ALBUTEROL SULFATE HFA 108 (90 BASE) MCG/ACT IN AERS: 1.0000 | INHALATION_SPRAY | RESPIRATORY_TRACT | Status: DC | PRN

## 2019-03-06 MED ORDER — ONDANSETRON HCL 4 MG/2ML IJ SOLN
INTRAMUSCULAR | Status: DC | PRN
  Administered 2019-03-06: 4 mg via INTRAVENOUS

## 2019-03-06 MED ORDER — SPIRONOLACTONE 25 MG PO TABS
50.0000 mg | ORAL_TABLET | Freq: Every day | ORAL | Status: DC
  Administered 2019-03-07: 50 mg via ORAL
  Filled 2019-03-06: qty 2

## 2019-03-06 MED ORDER — VANCOMYCIN HCL IN DEXTROSE 1-5 GM/200ML-% IV SOLN
INTRAVENOUS | Status: AC
  Administered 2019-03-06: 1000 mg via INTRAVENOUS
  Filled 2019-03-06: qty 200

## 2019-03-06 MED ORDER — GABAPENTIN 300 MG PO CAPS
300.0000 mg | ORAL_CAPSULE | Freq: Three times a day (TID) | ORAL | Status: DC
  Administered 2019-03-06 – 2019-03-07 (×2): 300 mg via ORAL
  Filled 2019-03-06 (×2): qty 1

## 2019-03-06 MED ORDER — VANCOMYCIN HCL IN DEXTROSE 1-5 GM/200ML-% IV SOLN
1000.0000 mg | Freq: Once | INTRAVENOUS | Status: AC
  Administered 2019-03-06: 17:00:00 1000 mg via INTRAVENOUS

## 2019-03-06 MED ORDER — NEBIVOLOL HCL 10 MG PO TABS
20.0000 mg | ORAL_TABLET | Freq: Every day | ORAL | Status: DC
  Administered 2019-03-06: 20 mg via ORAL
  Filled 2019-03-06: qty 2

## 2019-03-06 MED ORDER — SODIUM CHLORIDE 0.9 % IV SOLN
INTRAVENOUS | Status: AC
  Filled 2019-03-06: qty 500000

## 2019-03-06 MED ORDER — MIRABEGRON ER 25 MG PO TB24
50.0000 mg | ORAL_TABLET | Freq: Every day | ORAL | Status: DC
  Administered 2019-03-07: 50 mg via ORAL
  Filled 2019-03-06: qty 2

## 2019-03-06 MED ORDER — DEXTROSE-NACL 5-0.45 % IV SOLN
INTRAVENOUS | Status: DC
  Administered 2019-03-06 – 2019-03-07 (×2): via INTRAVENOUS

## 2019-03-06 MED ORDER — MORPHINE SULFATE (PF) 4 MG/ML IV SOLN
2.0000 mg | INTRAVENOUS | Status: DC | PRN
  Administered 2019-03-06: 2 mg via INTRAVENOUS
  Administered 2019-03-06: 4 mg via INTRAVENOUS
  Filled 2019-03-06 (×2): qty 1

## 2019-03-06 MED ORDER — PROPOFOL 10 MG/ML IV BOLUS
INTRAVENOUS | Status: DC | PRN
  Administered 2019-03-06: 40 mg via INTRAVENOUS

## 2019-03-06 MED ORDER — PIPERACILLIN-TAZOBACTAM 3.375 G IVPB
3.3750 g | Freq: Once | INTRAVENOUS | Status: AC
  Administered 2019-03-06: 17:00:00 3.375 g via INTRAVENOUS

## 2019-03-06 MED ORDER — PROPOFOL 10 MG/ML IV BOLUS
INTRAVENOUS | Status: AC
  Filled 2019-03-06: qty 60

## 2019-03-06 MED ORDER — GLUCAGON (RDNA) 1 MG IJ KIT: 1.0000 mg | PACK | Freq: Once | INTRAMUSCULAR | Status: DC | PRN

## 2019-03-06 MED ORDER — GENTAMICIN SULFATE 40 MG/ML IJ SOLN
5.0000 mg/kg | Freq: Once | INTRAVENOUS | Status: DC
  Filled 2019-03-06: qty 8.75

## 2019-03-06 MED ORDER — FENTANYL CITRATE (PF) 100 MCG/2ML IJ SOLN
INTRAMUSCULAR | Status: AC
  Filled 2019-03-06: qty 2

## 2019-03-06 MED ORDER — BUPIVACAINE-EPINEPHRINE (PF) 0.5% -1:200000 IJ SOLN
INTRAMUSCULAR | Status: AC
  Filled 2019-03-06: qty 30

## 2019-03-06 MED ORDER — PROPOFOL 10 MG/ML IV BOLUS
INTRAVENOUS | Status: AC
  Filled 2019-03-06: qty 20

## 2019-03-06 MED ORDER — STERILE WATER FOR IRRIGATION IR SOLN
Status: DC | PRN
  Administered 2019-03-06: 1000 mL

## 2019-03-06 MED ORDER — FENTANYL CITRATE (PF) 100 MCG/2ML IJ SOLN
INTRAMUSCULAR | Status: DC | PRN
  Administered 2019-03-06 (×4): 25 ug via INTRAVENOUS

## 2019-03-06 MED ORDER — PROPOFOL 500 MG/50ML IV EMUL
INTRAVENOUS | Status: DC | PRN
  Administered 2019-03-06: 70 ug/kg/min via INTRAVENOUS

## 2019-03-06 MED ORDER — LANCETS MISC. MISC: 1.0000 | Freq: Three times a day (TID) | Status: DC

## 2019-03-06 MED ORDER — LIDOCAINE-EPINEPHRINE (PF) 1 %-1:200000 IJ SOLN
INTRAMUSCULAR | Status: AC
  Filled 2019-03-06: qty 30

## 2019-03-06 MED ORDER — PIPERACILLIN-TAZOBACTAM 3.375 G IVPB
INTRAVENOUS | Status: AC
  Filled 2019-03-06: qty 50

## 2019-03-06 MED ORDER — MOMETASONE FURO-FORMOTEROL FUM 100-5 MCG/ACT IN AERO: 2.0000 | INHALATION_SPRAY | Freq: Two times a day (BID) | RESPIRATORY_TRACT | Status: DC

## 2019-03-06 MED ORDER — HYDROCODONE-ACETAMINOPHEN 5-325 MG PO TABS
1.0000 | ORAL_TABLET | ORAL | Status: DC | PRN
  Administered 2019-03-06: 2 via ORAL
  Filled 2019-03-06: qty 2

## 2019-03-06 MED ORDER — FENTANYL CITRATE (PF) 100 MCG/2ML IJ SOLN
25.0000 ug | INTRAMUSCULAR | Status: DC | PRN
  Administered 2019-03-06: 50 ug via INTRAVENOUS

## 2019-03-06 MED ORDER — LACTATED RINGERS IV SOLN
INTRAVENOUS | Status: DC
  Administered 2019-03-06: 16:00:00 via INTRAVENOUS

## 2019-03-06 MED ORDER — LOSARTAN POTASSIUM 50 MG PO TABS
50.0000 mg | ORAL_TABLET | Freq: Every day | ORAL | Status: DC
  Administered 2019-03-06 – 2019-03-07 (×2): 50 mg via ORAL
  Filled 2019-03-06 (×2): qty 1

## 2019-03-06 MED ORDER — ACETAMINOPHEN 325 MG PO TABS: 650.0000 mg | ORAL_TABLET | ORAL | Status: DC | PRN

## 2019-03-06 SURGICAL SUPPLY — 35 items
ADH SKN CLS APL DERMABOND .7 (GAUZE/BANDAGES/DRESSINGS) ×1
BRIEF STRETCH FOR OB PAD LRG (UNDERPADS AND DIAPERS) ×2 IMPLANT
COVER WAND RF STERILE (DRAPES) IMPLANT
DECANTER SPIKE VIAL GLASS SM (MISCELLANEOUS) ×2 IMPLANT
DERMABOND ADVANCED (GAUZE/BANDAGES/DRESSINGS) ×2
DERMABOND ADVANCED .7 DNX12 (GAUZE/BANDAGES/DRESSINGS) ×1 IMPLANT
DRAPE INCISE 23X17 IOBAN STRL (DRAPES) ×2
DRAPE INCISE 23X17 STRL (DRAPES) ×1 IMPLANT
DRAPE INCISE IOBAN 23X17 STRL (DRAPES) ×1 IMPLANT
DRAPE LAPAROSCOPIC ABDOMINAL (DRAPES) ×3 IMPLANT
DRSG PAD ABDOMINAL 8X10 ST (GAUZE/BANDAGES/DRESSINGS) ×4 IMPLANT
DRSG TEGADERM 2-3/8X2-3/4 SM (GAUZE/BANDAGES/DRESSINGS) ×3 IMPLANT
DRSG TEGADERM 4X4.75 (GAUZE/BANDAGES/DRESSINGS) ×3 IMPLANT
DRSG TELFA 3X8 NADH (GAUZE/BANDAGES/DRESSINGS) ×3 IMPLANT
GAUZE PACKING IODOFORM 1/2 (PACKING) ×2 IMPLANT
GAUZE SPONGE 4X4 12PLY STRL (GAUZE/BANDAGES/DRESSINGS) ×2 IMPLANT
GLOVE BIOGEL M STRL SZ7.5 (GLOVE) ×3 IMPLANT
GOWN STRL REUS W/TWL XL LVL3 (GOWN DISPOSABLE) ×3 IMPLANT
KIT BASIN OR (CUSTOM PROCEDURE TRAY) ×3 IMPLANT
KIT TURNOVER KIT A (KITS) IMPLANT
NDL HYPO 25X1 1.5 SAFETY (NEEDLE) IMPLANT
NEEDLE HYPO 25X1 1.5 SAFETY (NEEDLE) IMPLANT
NS IRRIG 1000ML POUR BTL (IV SOLUTION) ×2 IMPLANT
PACK GENERAL/GYN (CUSTOM PROCEDURE TRAY) ×3 IMPLANT
PAD DRESSING TELFA 3X8 NADH (GAUZE/BANDAGES/DRESSINGS) ×1 IMPLANT
SUT ETHILON 3 0 PS 1 (SUTURE) ×6 IMPLANT
SUT VIC AB 2-0 UR6 27 (SUTURE) ×6 IMPLANT
SUT VIC AB 4-0 PS2 27 (SUTURE) ×6 IMPLANT
SWAB COLLECTION DEVICE MRSA (MISCELLANEOUS) ×2 IMPLANT
SWAB CULTURE ESWAB REG 1ML (MISCELLANEOUS) ×2 IMPLANT
SYR CONTROL 10ML LL (SYRINGE) IMPLANT
TAPE SURG TRANSPORE 1 IN (GAUZE/BANDAGES/DRESSINGS) IMPLANT
TAPE SURGICAL TRANSPORE 1 IN (GAUZE/BANDAGES/DRESSINGS) ×2
TOWEL OR 17X26 10 PK STRL BLUE (TOWEL DISPOSABLE) ×6 IMPLANT
WATER STERILE IRR 1000ML POUR (IV SOLUTION) IMPLANT

## 2019-03-06 NOTE — Op Note (Signed)
Preoperative diagnosis: Infected InterStim Postoperative diagnosis: Infected InterStim Surgery: Removal of infected InterStim Surgeon: Dr. Nicki Reaper Inigo Lantigua  The patient has the above diagnosis and consented the above procedure.  IV Zosyn and vancomycin was given.  Patient was not septic.  Findings were noted in clinic note  She was prepped and draped in usual fashion in the prone position.  Fluoroscopy was utilized to make certain she did not have a retained tip  I marked the incision in the right upper buttock and midline.  I made the midline incision approximately 3 cm purposely because there is induration in that area.  I opened the incision and I quickly dissected down through the pseudocapsule with fluid expelled.  I was not foul-smelling.  Culture sent.  It was easy to deliver the IPG.  Lead was cut and hemostat was placed on lead.  I made a 3 cm midline incision.  I dissected down through thickened tissue approximately a centimeter and a half.  I could feel the lead.  Lead was easily found with hemostat delivered from lateral to medial  AP lateral fluoroscopy was utilized.  Fluoroscopy noted the lead in good position.  With almost no resistance the lead came out easily after I had dissected down to the bone table or near to that area.  It was removed with a larger hemostat.  There was no retained hip.  X-ray was taken  I finger dissected between the midline incision and the lateral incision to make certain that there was good drainage.  Minimal cautery was used for hemostasis.  The right upper buttock pocket looked actually nice and clean with granulation tissue.  Finger dissected as noted.  Medial the tissue was a bit indurated but overall the skin itself looks very healthy.  There is no air.  I did minimal iodoform gauze medially and laterally.  Medially I did one 3-0 interrupted skin suture and 2 on the longer incision to speed up healing time.  Wet-to-dry dressing applied.  Mesh pads  applied.  Patient taken to recovery.  She will be followed as per protocol

## 2019-03-06 NOTE — Anesthesia Postprocedure Evaluation (Signed)
Anesthesia Post Note  Patient: [name]  Procedure(s) Performed: REMOVAL OF Barrie Lyme IMPLANT (N/A )     Patient location during evaluation: PACU Anesthesia Type: MAC Level of consciousness: awake Pain management: pain level controlled Vital Signs Assessment: post-procedure vital signs reviewed and stable Respiratory status: spontaneous breathing Cardiovascular status: stable Postop Assessment: no apparent nausea or vomiting Anesthetic complications: no    Last Vitals:  Vitals:   03/06/19 1748 03/06/19 1800  BP: 128/61 (!) 146/74  Pulse: 73 72  Resp: 12 13  Temp: 36.4 C   SpO2: 100% 100%    Last Pain:  Vitals:   03/06/19 1800  TempSrc:   PainSc: 8                  Belkys Henault

## 2019-03-06 NOTE — Transfer of Care (Signed)
Immediate Anesthesia Transfer of Care Note  Patient: [name]  Procedure(s) Performed: Procedure(s): REMOVAL OF INTERSTIM IMPLANT (N/A)  Patient Location: PACU  Anesthesia Type:MAC  Level of Consciousness:  sedated, patient cooperative and responds to stimulation  Airway & Oxygen Therapy:Patient Spontanous Breathing and Patient connected to face mask oxgen  Post-op Assessment:  Report given to PACU RN and Post -op Vital signs reviewed and stable  Post vital signs:  Reviewed and stable  Last Vitals:  Vitals:   03/06/19 1500  BP: (!) 180/98  Pulse: (!) 106  Resp: (!) 22  Temp: 36.9 C  SpO2: 11%    Complications: No apparent anesthesia complications

## 2019-03-06 NOTE — Interval H&P Note (Signed)
History and Physical Interval Note:  03/06/2019 3:47 PM  [name]  has presented today for surgery, with the diagnosis of INFECTED INTERSTIM.  The various methods of treatment have been discussed with the patient and family. After consideration of risks, benefits and other options for treatment, the patient has consented to  Procedure(s): REMOVAL OF INTERSTIM IMPLANT (N/A) as a surgical intervention.  The patient's history has been reviewed, patient examined, no change in status, stable for surgery.  I have reviewed the patient's chart and labs.  Questions were answered to the patient's satisfaction.     [name]

## 2019-03-06 NOTE — H&P (Signed)
01/30/19:   On Jan 21 2019 patient had a stage I and 2 InterStim  she has refractory urgency incontinence and frequency did very well with the test stimulation.  Completely dry. Frequency stable. Exceptionally happy. Incisions look good re-evaluate in 4 months     03/06/19: Patient with above-noted history. She presents today with complaints of pain at her InterStim site and pain associated with voiding. She states that she has noted pain at her InterStim site for approximately 1 week, but this is become more progressive over the past 3 days. Approximately 3 days ago she did note some swelling and some drainage from the site. She also complains of pain associated with voiding. Otherwise, she feels her urinary symptoms are relatively stable. She knows general malaise, but denies fevers or chills. She is using daily Macrodantin for urinary prophylaxis. She also remains on Myrbetriq daily. She is insulin-dependent diabetic, as well.     ALLERGIES: Codeine Derivatives    MEDICATIONS: Macrodantin 100 mg capsule 1 capsule PO Daily  Myrbetriq 50 mg tablet, extended release 24 hr 1 tablet PO Daily  Amlodipine Besylate 10 mg tablet Oral  Gabapentin 800 mg tablet Oral  Metformin Hcl 500 mg tablet Oral  Metoprolol Tartrate 75 mg tablet Oral  Novolog 100 unit/ml vial Subcutaneous  Pravastatin Sodium 40 mg tablet Oral  Spironolactone 25 mg tablet Oral  Victoza 3-Pak 0.6 mg/0.1 ml (18 mg/3 ml) pen injector Subcutaneous  Vitamin D3 50 mcg (2,000 unit) capsule Oral     GU PSH: Cystoscopy - 09/05/2018 Interstim Stage 2 Generator - 01/21/2019 Interstim Stage One Neurostimulator - 01/21/2019 Sling - 2015       PSH Notes: Vaginal Sling Operation For Stress Incontinence, Cholecystectomy, Knee Surgery, Throat Surgery, Wrist Surgery   NON-GU PSH: Cholecystectomy (open) - 2015     GU PMH: Chronic cystitis (w/o hematuria), Chronic cystitis - 2017 Urinary Frequency, Increased urinary frequency - 2017 Mixed  incontinence, Urge and stress incontinence - 2017 Renal cyst, Renal cyst, acquired - 2015 Urinary Tract Inf, Unspec site, Urinary tract infection - 2015 Nocturia, Nocturia - 2015    NON-GU PMH: Encounter for general adult medical examination without abnormal findings, Encounter for preventive health examination - 2015 Personal history of other diseases of the circulatory system, History of hypertension - 2015 Personal history of other diseases of the digestive system, History of gastroesophageal reflux (GERD) - 2015 Personal history of other diseases of the musculoskeletal system and connective tissue, History of gout - 2015, History of arthritis, - 2015 Personal history of other diseases of the nervous system and sense organs, History of glaucoma - 2015 Personal history of other diseases of the respiratory system, History of asthma - 2015 Personal history of other endocrine, nutritional and metabolic disease, History of diabetes mellitus - 2015, History of hypercholesterolemia, - 2015 Personal history of other mental and behavioral disorders, History of depression - 2015    FAMILY HISTORY: Diabetes - Runs In Family Hypertension - Runs In Family Lung Cancer - Runs In Family   SOCIAL HISTORY: Marital Status: Divorced     Notes: Never a smoker, Disabled, Caffeine use, Alcohol use, Divorced   REVIEW OF SYSTEMS:    GU Review Female:   Patient denies frequent urination, hard to postpone urination, burning /pain with urination, get up at night to urinate, leakage of urine, stream starts and stops, trouble starting your stream, have to strain to urinate, and being pregnant.  Gastrointestinal (Upper):   Patient denies nausea, vomiting, and indigestion/ heartburn.  Gastrointestinal (Lower):   Patient denies diarrhea and constipation.  Constitutional:   Patient denies fever, night sweats, weight loss, and fatigue.  Skin:   Patient denies skin rash/ lesion and itching.  Eyes:   Patient denies  blurred vision and double vision.  Ears/ Nose/ Throat:   Patient denies sore throat and sinus problems.  Hematologic/Lymphatic:   Patient denies swollen glands and easy bruising.  Cardiovascular:   Patient denies leg swelling and chest pains.  Respiratory:   Patient denies cough and shortness of breath.  Endocrine:   Patient denies excessive thirst.  Musculoskeletal:   Patient denies back pain and joint pain.  Neurological:   Patient denies headaches and dizziness.  Psychologic:   Patient denies anxiety and depression.   VITAL SIGNS:      03/06/2019 09:23 AM  Weight 163 lb / 73.94 kg  Height 64 in / 162.56 cm  BP 165/88 mmHg  Pulse 108 /min  Temperature 97.8 F / 36.5 C  BMI 28.0 kg/m   MULTI-SYSTEM PHYSICAL EXAMINATION:    Constitutional: Well-nourished. No physical deformities. Normally developed. Good grooming. Appears uncomfortable.  Respiratory: No labored breathing, no use of accessory muscles.   Cardiovascular: Normal temperature, normal extremity pulses, no swelling, no varicosities.  Skin: No paleness, no jaundice, no cyanosis. No lesion, no ulcer, no rash. InterStim incision noted at right lower back. There is an opening on the lateral aspect of her incision, which appears to be draining purulent material. There is a significant amount of erythema and edema surrounding her incision site concerning for infectious process. This extends medially towards the medial incision. Tender to palpation.  Neurologic / Psychiatric: Oriented to time, oriented to place, oriented to person. No depression, no anxiety, no agitation.  Gastrointestinal: No mass, no tenderness, no rigidity, non obese abdomen.  Musculoskeletal: Normal gait and station of head and neck.     PAST DATA REVIEWED:  Source Of History:  Patient  Records Review:   Previous Patient Records  Urine Test Review:   Urinalysis, Urine Culture   PROCEDURES:          Urinalysis w/Scope Dipstick Dipstick Cont'd Micro  Color:  Yellow Bilirubin: Neg mg/dL WBC/hpf: 40 - 60/hpf  Appearance: Cloudy Ketones: Trace mg/dL RBC/hpf: 3 - 10/hpf  Specific Gravity: 1.025 Blood: 1+ ery/uL Bacteria: Many (>50/hpf)  pH: 6.0 Protein: 2+ mg/dL Cystals: NS (Not Seen)  Glucose: Neg mg/dL Urobilinogen: 1.0 mg/dL Casts: NS (Not Seen)    Nitrites: Neg Trichomonas: Not Present    Leukocyte Esterase: 2+ leu/uL Mucous: Present      Epithelial Cells: 0 - 5/hpf      Yeast: Few (1 - 5/hpf)      Sperm: Not Present         Ceftriaxone 1g - 23300, T6226 Qty: 1 Adm. By: Alleen Borne McDougald  Unit: gram Lot No [id number]  Route: IM Exp. Date 11/26/2019  Freq: None Mfgr.:   Site: Left Buttock   ASSESSMENT:      ICD-10 Details  2 GU:   Chronic cystitis (w/o hematuria) - N30.20   1 NON-GU:   Cellulitis, unspecified - L03.90    PLAN:           Orders Labs Urine Culture, Culture, Wound          Document Letter(s):  Created for Patient: Clinical Summary         Notes:   Urinalysis today does show pyuria and bacteriuria. I will send for urine culture. On physical exam  today, she has infection noted at her InterStim implant site. The wound is draining purulent-appearing material with surrounding erythema and edema. Wound culture obtained from this site. Patient assessed in conjunction with Dr. Matilde Sprang who recommends we proceed with urgent removal/ excision of her InterStim device today. She will likely be kept for observation and IV antibiotic therapy following the procedure, as well. She was given 1 g of Rocephin in clinic today. She is instructed to remain NPO. The procedure and the potential risk of the procedure were reviewed with the patient in detail today. She voiced understanding. She will proceed to [facility], as instructed for procedure later this afternoon.   After a thorough review of the management options for the patient's condition the patient  elected to proceed with surgical therapy as noted above. We have discussed the  potential benefits and risks of the procedure, side effects of the proposed treatment, the likelihood of the patient achieving the goals of the procedure, and any potential problems that might occur during the procedure or recuperation. Informed consent has been obtained.

## 2019-03-06 NOTE — Anesthesia Preprocedure Evaluation (Addendum)
Anesthesia Evaluation  Patient identified by MRN, date of birth, ID band Patient awake    Reviewed: Allergy & Precautions, NPO status , Patient's Chart, lab work & pertinent test results  Airway Mallampati: II  TM Distance: >3 FB     Dental   Pulmonary    breath sounds clear to auscultation       Cardiovascular hypertension,  Rhythm:Regular Rate:Normal     Neuro/Psych    GI/Hepatic hiatal hernia, GERD  ,  Endo/Other  diabetes  Renal/GU Renal disease     Musculoskeletal   Abdominal   Peds  Hematology   Anesthesia Other Findings   Reproductive/Obstetrics                             Anesthesia Physical Anesthesia Plan  ASA: III  Anesthesia Plan: MAC   Post-op Pain Management:    Induction: Intravenous  PONV Risk Score and Plan: 2 and Ondansetron, Dexamethasone and Midazolam  Airway Management Planned: Nasal Cannula and Simple Face Mask  Additional Equipment:   Intra-op Plan:   Post-operative Plan:   Informed Consent: I have reviewed the patients History and Physical, chart, labs and discussed the procedure including the risks, benefits and alternatives for the proposed anesthesia with the patient or authorized representative who has indicated his/her understanding and acceptance.     Dental advisory given  Plan Discussed with: CRNA, Anesthesiologist and Surgeon  Anesthesia Plan Comments:        Anesthesia Quick Evaluation

## 2019-03-07 ENCOUNTER — Encounter (HOSPITAL_COMMUNITY): Payer: Self-pay | Admitting: Urology

## 2019-03-07 DIAGNOSIS — Z8249 Family history of ischemic heart disease and other diseases of the circulatory system: Secondary | ICD-10-CM | POA: Diagnosis not present

## 2019-03-07 DIAGNOSIS — T85840A Pain due to nervous system prosthetic devices, implants and grafts, initial encounter: Secondary | ICD-10-CM | POA: Diagnosis not present

## 2019-03-07 DIAGNOSIS — T85734A Infection and inflammatory reaction due to implanted electronic neurostimulator, generator, initial encounter: Secondary | ICD-10-CM | POA: Diagnosis not present

## 2019-03-07 DIAGNOSIS — Z79899 Other long term (current) drug therapy: Secondary | ICD-10-CM | POA: Diagnosis not present

## 2019-03-07 DIAGNOSIS — E119 Type 2 diabetes mellitus without complications: Secondary | ICD-10-CM | POA: Diagnosis not present

## 2019-03-07 DIAGNOSIS — N3020 Other chronic cystitis without hematuria: Secondary | ICD-10-CM | POA: Diagnosis not present

## 2019-03-07 DIAGNOSIS — Z1159 Encounter for screening for other viral diseases: Secondary | ICD-10-CM | POA: Diagnosis not present

## 2019-03-07 DIAGNOSIS — K219 Gastro-esophageal reflux disease without esophagitis: Secondary | ICD-10-CM | POA: Diagnosis not present

## 2019-03-07 DIAGNOSIS — Z885 Allergy status to narcotic agent status: Secondary | ICD-10-CM | POA: Diagnosis not present

## 2019-03-07 DIAGNOSIS — Z91018 Allergy to other foods: Secondary | ICD-10-CM | POA: Diagnosis not present

## 2019-03-07 DIAGNOSIS — I10 Essential (primary) hypertension: Secondary | ICD-10-CM | POA: Diagnosis not present

## 2019-03-07 DIAGNOSIS — Z7951 Long term (current) use of inhaled steroids: Secondary | ICD-10-CM | POA: Diagnosis not present

## 2019-03-07 DIAGNOSIS — K449 Diaphragmatic hernia without obstruction or gangrene: Secondary | ICD-10-CM | POA: Diagnosis not present

## 2019-03-07 DIAGNOSIS — Z794 Long term (current) use of insulin: Secondary | ICD-10-CM | POA: Diagnosis not present

## 2019-03-07 DIAGNOSIS — L0390 Cellulitis, unspecified: Secondary | ICD-10-CM | POA: Diagnosis not present

## 2019-03-07 DIAGNOSIS — E7800 Pure hypercholesterolemia, unspecified: Secondary | ICD-10-CM | POA: Diagnosis not present

## 2019-03-07 LAB — GLUCOSE, CAPILLARY
Glucose-Capillary: 171 mg/dL — ABNORMAL HIGH (ref 70–99)
Glucose-Capillary: 223 mg/dL — ABNORMAL HIGH (ref 70–99)

## 2019-03-07 MED ORDER — CEPHALEXIN 500 MG PO CAPS: 500.0000 mg | ORAL_CAPSULE | Freq: Three times a day (TID) | ORAL | 0 refills | Status: AC

## 2019-03-07 MED ORDER — TRAMADOL HCL 50 MG PO TABS: 50.0000 mg | ORAL_TABLET | Freq: Four times a day (QID) | ORAL | 0 refills | Status: DC | PRN

## 2019-03-07 MED ORDER — SULFAMETHOXAZOLE-TRIMETHOPRIM 800-160 MG PO TABS: 1.0000 | ORAL_TABLET | Freq: Two times a day (BID) | ORAL | 0 refills | Status: DC

## 2019-03-07 NOTE — Discharge Summary (Signed)
Date of admission: 03/06/2019  Date of discharge: 03/07/2019  Admission diagnosis: Infected InterStim device  Discharge diagnosis: Infected InterStim device  Secondary diagnoses: Diabetes  History and Physical: For full details, please see admission history and physical. Briefly, [name] is a 68 y.o. year old patient who presented to the office yesterday with an infected InterStim device.  She was recommended to undergo surgical explantation.  Hospital Course: She was taken to the operating room and underwent explantation of her InterStim device and neuro modulatory lead.  This was uncomplicated.  She was placed on broad-spectrum IV antibiotics and observed in the hospital overnight.  She remained afebrile and her wound was checked in the morning of postoperative day 1 and appeared to be healing without evidence for progressive infection.  She was felt stable for discharge home with continued outpatient antibiotic therapy.  Her cultures are pending at the time of discharge.  Laboratory values:  Recent Labs    03/06/19 1531  HGB 13.9  HCT 42.7   Recent Labs    03/06/19 1531  CREATININE 1.19*    Disposition: Home  Discharge instruction: She will leave her dressing in place and continue antibiotic therapy.  She has been instructed to call should she develop fever greater than 101.  She will be scheduled to see Dr. Matilde Sprang for further evaluation on Tuesday.  Discharge medications:  Allergies as of 03/07/2019      Reactions   Pollen Extract    Tomato    Codeine Hives, Itching, Nausea And Vomiting      Medication List    STOP taking these medications   glucagon 1 MG injection Commonly known as: Glucagon Emergency   mometasone-formoterol 100-5 MCG/ACT Aero Commonly known as: DULERA   ondansetron 4 MG tablet Commonly known as: ZOFRAN     TAKE these medications   albuterol 108 (90 Base) MCG/ACT inhaler Commonly known as: ProAir HFA 2 puffs every 4 hours as  needed only  if your can't catch your breath What changed:   how much to take  how to take this  when to take this  reasons to take this  additional instructions   amLODipine 10 MG tablet Commonly known as: NORVASC TAKE 1 TABLET(10 MG) BY MOUTH EVERY EVENING What changed: See the new instructions.   cephALEXin 500 MG capsule Commonly known as: KEFLEX Take 1 capsule (500 mg total) by mouth 3 (three) times daily for 10 days.   docusate sodium 100 MG capsule Commonly known as: COLACE Take 100 mg by mouth 2 (two) times daily as needed for mild constipation.   FreeStyle Libre 14 Day Sensor Misc by Does not apply route. CGM 14-day sensors; send refills to [facility] (in [place])   [employer] by Does not apply route. CGM device-Freestyle Libre   gabapentin 300 MG capsule Commonly known as: NEURONTIN Take 1 capsule (300 mg total) by mouth 3 (three) times daily.   glucose blood test strip Check blood sugar before meals and before bedtime   insulin lispro 100 UNIT/ML injection Commonly known as: HUMALOG Inject 25 units in AM, 35 units at lunchtime, and 25 units at evening meal   Lancets Misc. Misc 1 each by Does not apply route 4 (four) times daily -  with meals and at bedtime.   losartan 50 MG tablet Commonly known as: COZAAR Take 1 tablet (50 mg total) by mouth daily.   Myrbetriq 50 MG Tb24 tablet Generic drug: mirabegron ER Take 1 tablet by  mouth daily.   Nebivolol HCl 20 MG Tabs Commonly known as: Bystolic Take 1 tablet (20 mg total) by mouth daily.   Ozempic (1 MG/DOSE) 2 MG/1.5ML Sopn Generic drug: Semaglutide (1 MG/DOSE) INJECT 1 MG INTO THE SKIN ONCE A WEEK What changed: See the new instructions.   pantoprazole 40 MG tablet Commonly known as: Protonix Take 30- 60 min before your first and last meals of the day What changed:   how much to take  how to take this  when to take this  additional instructions   pravastatin 80 MG  tablet Commonly known as: PRAVACHOL TAKE 1 TABLET BY MOUTH EVERY DAY What changed: how much to take   spironolactone 50 MG tablet Commonly known as: ALDACTONE TAKE 1 TABLET(50 MG) BY MOUTH DAILY What changed: See the new instructions.   sulfamethoxazole-trimethoprim 800-160 MG tablet Commonly known as: BACTRIM DS Take 1 tablet by mouth 2 (two) times daily.   traMADol 50 MG tablet Commonly known as: ULTRAM Take 1-2 tablets (50-100 mg total) by mouth every 6 (six) hours as needed (pain).   Vitamin D (Ergocalciferol) 1.25 MG (50000 UT) Caps capsule Commonly known as: DRISDOL TAKE 1 CAPSULE BY MOUTH EVERY 7 DAYS ON WEDNESDAY What changed: See the new instructions.       Followup:  Follow-up Information    MacDiarmid, Nicki Reaper, MD Follow up.   Specialty: Urology Why: Dr. Matilde Sprang will arrange for the office to call to schedule an appointment to see him on Tuesday. Contact information: [place] [address] [phone]

## 2019-03-07 NOTE — Progress Notes (Signed)
AVS given to patient and explained at the bedside. Medications and follow up appointments have been explained with pt verbalizing understanding.  

## 2019-03-07 NOTE — Discharge Instructions (Signed)
1.  You may leave the current dressing in place until you see Dr. Matilde Sprang next Tuesday. 2.  You will be prescribed 2 antibiotics to take and should take these until you see Dr. Matilde Sprang at least. 3.  Please call if you develop fever greater than 101.

## 2019-03-08 LAB — HIV ANTIBODY (ROUTINE TESTING W REFLEX): HIV Screen 4th Generation wRfx: NONREACTIVE

## 2019-03-09 ENCOUNTER — Encounter (HOSPITAL_COMMUNITY): Payer: Self-pay | Admitting: Urology

## 2019-03-10 ENCOUNTER — Telehealth: Payer: Self-pay

## 2019-03-10 DIAGNOSIS — N3020 Other chronic cystitis without hematuria: Secondary | ICD-10-CM | POA: Diagnosis not present

## 2019-03-10 DIAGNOSIS — R350 Frequency of micturition: Secondary | ICD-10-CM | POA: Diagnosis not present

## 2019-03-11 LAB — AEROBIC/ANAEROBIC CULTURE W GRAM STAIN (SURGICAL/DEEP WOUND)

## 2019-03-16 ENCOUNTER — Ambulatory Visit: Payer: Self-pay | Admitting: Pharmacist

## 2019-03-16 DIAGNOSIS — E1165 Type 2 diabetes mellitus with hyperglycemia: Secondary | ICD-10-CM

## 2019-03-16 DIAGNOSIS — I10 Essential (primary) hypertension: Secondary | ICD-10-CM

## 2019-03-18 DIAGNOSIS — E118 Type 2 diabetes mellitus with unspecified complications: Secondary | ICD-10-CM | POA: Diagnosis not present

## 2019-03-18 DIAGNOSIS — Z794 Long term (current) use of insulin: Secondary | ICD-10-CM | POA: Diagnosis not present

## 2019-03-19 ENCOUNTER — Other Ambulatory Visit: Payer: Self-pay

## 2019-03-19 ENCOUNTER — Telehealth: Payer: Self-pay

## 2019-03-19 ENCOUNTER — Encounter: Payer: Self-pay | Admitting: Internal Medicine

## 2019-03-19 ENCOUNTER — Ambulatory Visit (INDEPENDENT_AMBULATORY_CARE_PROVIDER_SITE_OTHER): Payer: Medicare Other | Admitting: Internal Medicine

## 2019-03-19 VITALS — BP 140/66 | HR 56 | Temp 98.1°F | Ht 64.0 in | Wt 155.2 lb

## 2019-03-19 DIAGNOSIS — E2839 Other primary ovarian failure: Secondary | ICD-10-CM | POA: Diagnosis not present

## 2019-03-19 DIAGNOSIS — I10 Essential (primary) hypertension: Secondary | ICD-10-CM

## 2019-03-19 DIAGNOSIS — E1165 Type 2 diabetes mellitus with hyperglycemia: Secondary | ICD-10-CM

## 2019-03-19 DIAGNOSIS — R5383 Other fatigue: Secondary | ICD-10-CM

## 2019-03-19 NOTE — Progress Notes (Signed)
  Chronic Care Management   Visit Note  03/16/2019 Name: [name] MRN: [id number] DOB: [birth year]  Referred by: [name], MD Reason for referral : Chronic Care Management   [name] [name] is a 68 y.o. year old female who is a primary care patient of [name], MD. The CCM team was consulted for assistance with chronic disease management and care coordination needs.   Review of patient status, including review of consultants reports, relevant laboratory and other test results, and collaboration with appropriate care team members and the patient's provider was performed as part of comprehensive patient evaluation and provision of chronic care management services.    I spoke with Ms. Christensen by telephone today.  Objective:   Goals Addressed            This Visit's Progress     Patient Stated   . I want to control my diabetes (pt-stated)       Current Barriers:  [name] Knowledge Deficits related to diabetes diet & lifestyle modifications . Non Adherence to prescribed medication regimen  Pharmacist Clinical Goal(s):  [name] Over the next 90 days, patient will work with PharmD and PCP to address needs related to optimized medication management of chronic conditions  Interventions: . Comprehensive medication review performed.  Reviewed medication fill history via insurance claims data. . Reviewed & discussed the following diabetes-related information with patient: o Encouraged patient to continue checking blood sugars as directed-->patient now has scanner glucometer (FreeStyle [place]), will be able to download data at PCP office visit on 03/19/19 o Confirmed current DM regimen: Humalog 25u breakfast, 35u lunch, 25u dinner; Ozempic 1mg  weekly o Reviewed medication purpose/side effects-->patient denies adverse events, denies hypoglycemia, reports FBG<200.  She reports her BG has not gotten above 200.  She has enjoyed scanning her arm for BG vs. Lancet method.   o Continue  to follow ADA recommended "diabetes-friendly" diet  (reviewed healthy snack/food options).  Counseled patient to avoid sugary drinks and foods.  She is working to decrease potato & carbohydrates.  She is also able to walk more because she has had her bladder procedure o Most recent A1c is 12.0 (March 2020).  Anticipate labs from PCP appt on 03/19/19 o Patient has not filled statin in 2020 per claims data. Will discuss next week. o Continue taking all medications as prescribed by provider  Patient Self Care Activities:  . Self administers medications as prescribed . Attends all scheduled provider appointments . Calls pharmacy for medication refills . Performs ADL's independently . Calls provider office for new concerns or questions  Initial goal documentation         Plan:   The care management team will reach out to the patient again over the next 1 week.   Regina Eck, PharmD, BCPS Clinical Pharmacist, [facility] [place]: [phone]

## 2019-03-19 NOTE — Patient Instructions (Signed)
Visit Information  Goals Addressed            This Visit's Progress     Patient Stated   . I want to control my diabetes (pt-stated)       Current Barriers:  [name] Knowledge Deficits related to diabetes diet & lifestyle modifications . Non Adherence to prescribed medication regimen  Pharmacist Clinical Goal(s):  [name] Over the next 90 days, patient will work with PharmD and PCP to address needs related to optimized medication management of chronic conditions  Interventions: . Comprehensive medication review performed.  Reviewed medication fill history via insurance claims data. . Reviewed & discussed the following diabetes-related information with patient: o Encouraged patient to continue checking blood sugars as directed-->patient now has scanner glucometer (FreeStyle [place]), will be able to download data at PCP office visit on 03/19/19 o Confirmed current DM regimen: Humalog 25u breakfast, 35u lunch, 25u dinner; Ozempic 1mg  weekly o Reviewed medication purpose/side effects-->patient denies adverse events, denies hypoglycemia, reports FBG<200.  She reports her BG has not gotten above 200.  She has enjoyed scanning her arm for BG vs. Lancet method.   o Continue to follow ADA recommended "diabetes-friendly" diet  (reviewed healthy snack/food options).  Counseled patient to avoid sugary drinks and foods.  She is working to decrease potato & carbohydrates.  She is also able to walk more because she has had her bladder procedure o Most recent A1c is 12.0 (March 2020).  Anticipate labs from PCP appt on 03/19/19 o Patient has not filled statin in 2020 per claims data. Will discuss next week. o Continue taking all medications as prescribed by provider  Patient Self Care Activities:  . Self administers medications as prescribed . Attends all scheduled provider appointments . Calls pharmacy for medication refills . Performs ADL's independently . Calls provider office for new concerns or  questions  Initial goal documentation        The patient verbalized understanding of instructions provided today and declined a print copy of patient instruction materials.   The care management team will reach out to the patient again over the next 1 week.  Regina Eck, PharmD, BCPS Clinical Pharmacist, [facility] [place]: [phone]

## 2019-03-19 NOTE — Patient Instructions (Signed)

## 2019-03-20 ENCOUNTER — Ambulatory Visit: Payer: Self-pay

## 2019-03-20 DIAGNOSIS — E1165 Type 2 diabetes mellitus with hyperglycemia: Secondary | ICD-10-CM

## 2019-03-20 DIAGNOSIS — I10 Essential (primary) hypertension: Secondary | ICD-10-CM

## 2019-03-20 LAB — HEMOGLOBIN A1C
Est. average glucose Bld gHb Est-mCnc: 203 mg/dL
Hgb A1c MFr Bld: 8.7 % — ABNORMAL HIGH (ref 4.8–5.6)

## 2019-03-20 LAB — HEPATIC FUNCTION PANEL
ALT: 11 IU/L (ref 0–32)
AST: 18 IU/L (ref 0–40)
Albumin: 3.9 g/dL (ref 3.8–4.8)
Alkaline Phosphatase: 74 IU/L (ref 39–117)
Bilirubin Total: 0.5 mg/dL (ref 0.0–1.2)
Bilirubin, Direct: 0.13 mg/dL (ref 0.00–0.40)
Total Protein: 7 g/dL (ref 6.0–8.5)

## 2019-03-20 NOTE — Chronic Care Management (AMB) (Signed)
Chronic Care Management   Follow Up Note   03/20/2019 Name: [name] MRN: [id number] DOB: 09/05/1950  Referred by: [name], MD Reason for referral : Chronic Care Management (CCM RNCM Telephone Follow up)   [name] is a 68 y.o. year old female who is a primary care patient of [name], MD. The CCM team was consulted for assistance with chronic disease management and care coordination needs.    Review of patient status, including review of consultants reports, relevant laboratory and other test results, and collaboration with appropriate care team members and the patient's provider was performed as part of comprehensive patient evaluation and provision of chronic care management services.    I spoke with Ms. [name] by telephone today for a f/u on her DM.   Goals Addressed      Patient Stated   . "I have reoccuring UTI's" (pt-stated)       Current Barriers:  [name] Knowledge Deficits related to Urinary dysfunction and reoccurring UTI's  Nurse Case Manager Clinical Goal(s):  Over the next 60 days, patient will not experience any ED visits or hospital admissions related to infection. Goal not met - patient reports 1 ED visit on 03/06/19 related to urinary dysfunction Over the next 30 days, patient will report improved urinary function w/o s/s of UTI and will know the plan for ongoing health maintenance related to her Urinary function.   CCM RN CM Interventions:  03/20/19 call completed with patient   . Evaluation of current treatment plan related to Urinary dysfunction and patient's adherence to plan as established by provider. . Advised patient to complete her full course of antibiotics as prescribed and to alert her prescribing physician of persistent GI symptoms such as diarrhea, persistent loose stools and or abdominal pain; discussed also reporting s/s of UTI, discussed importance of reporting symptoms of infection early in order to receive early  treatment; patient advised to drink plenty of water to stay well hydrated and keep kidneys functioning well   . Discussed plans with patient for ongoing care management follow up and provided patient with direct contact information for care management team  Patient Self Care Activities:  . Self administers medications as prescribed . Attends all scheduled provider appointments . Calls pharmacy for medication refills . Performs ADL's independently . Performs IADL's independently . Calls provider office for new concerns or questions  Please see past updates related to this goal by clicking on the "Past Updates" button in the selected goal      . COMPLETED: "I know stress and infection can cause my sugars to run high" (pt-stated)       Current Barriers:  [name] Knowledge Deficits related to Diabetes disease process and Self Health Management   Nurse Case Manager Clinical Goal(s):  [name] Over the next 60 days, patient will work with the CCM team to address needs related to improved Diabetes management as evidence by patient will have improved CBG's (no BS<80 or >250) and 100 % adherence with following her Diabetes treatment plan. Goal met   CCM RN CM Interventions:  03/20/19 call completed with patient   . Evaluation of current treatment plan related to Diabetes and patient's adherence to plan as established by provider. . Advised patient to continue following her pharmacological treatment and ADA dietary recommendations; Informed patient her A1C from 03/19/19 is down to 8.7; discussed her target A1C is <7.0 - positive reinforcement given to patient and encouraged patient to keep up the good work; discussed  patient received the printed diabetic educational materials and found them to be useful and easy to understand   . Collaborated with embedded Pharm D regarding patient's A1C from 03/19/19 is down to 8.7 . Discussed plans with patient for ongoing care management follow up and provided patient with  direct contact information for care management team . Provided patient with printed educational materials related to healthy low carb snacks  Patient Self Care Activities:  . Self administers medications as prescribed . Attends all scheduled provider appointments . Calls pharmacy for medication refills . Performs ADL's independently . Performs IADL's independently . Calls provider office for new concerns or questions  Please see past updates related to this goal by clicking on the "Past Updates" button in the selected goal          Telephone follow up appointment with care management team member scheduled for: 04/01/19  [name], RN, BSN, CCM Care Management Coordinator [place] [facility]  Direct Phone: [phone]

## 2019-03-20 NOTE — Patient Instructions (Signed)
Visit Information  Goals Addressed      Patient Stated   . "I have reoccuring UTI's" (pt-stated)       Current Barriers:  [name] Knowledge Deficits related to Urinary dysfunction and reoccurring UTI's  Nurse Case Manager Clinical Goal(s):  Over the next 60 days, patient will not experience any ED visits or hospital admissions related to infection. Goal not met - patient reports 1 ED visit on 03/06/19 related to urinary dysfunction Over the next 30 days, patient will report improved urinary function w/o s/s of UTI and will know the plan for ongoing health maintenance related to her Urinary function.   CCM RN CM Interventions:  03/20/19 call completed with patient   . Evaluation of current treatment plan related to Urinary dysfunction and patient's adherence to plan as established by provider. . Advised patient to complete her full course of antibiotics as prescribed and to alert her prescribing physician of persistent GI symptoms such as diarrhea, persistent loose stools and or abdominal pain; discussed also reporting s/s of UTI, discussed importance of reporting symptoms of infection early in order to receive early treatment; patient advised to drink plenty of water to stay well hydrated and keep kidneys functioning well   . Discussed plans with patient for ongoing care management follow up and provided patient with direct contact information for care management team  Patient Self Care Activities:  . Self administers medications as prescribed . Attends all scheduled provider appointments . Calls pharmacy for medication refills . Performs ADL's independently . Performs IADL's independently . Calls provider office for new concerns or questions  Please see past updates related to this goal by clicking on the "Past Updates" button in the selected goal     . COMPLETED: "I know stress and infection can cause my sugars to run high" (pt-stated)       Current Barriers:  [name] Knowledge Deficits related  to Diabetes disease process and Self Health Management   Nurse Case Manager Clinical Goal(s):  [name] Over the next 60 days, patient will work with the CCM team to address needs related to improved Diabetes management as evidence by patient will have improved CBG's (no BS<80 or >250) and 100 % adherence with following her Diabetes treatment plan. Goal met   CCM RN CM Interventions:  03/20/19 call completed with patient   . Evaluation of current treatment plan related to Diabetes and patient's adherence to plan as established by provider. . Advised patient to continue following her pharmacological treatment and ADA dietary recommendations; Informed patient her A1C from 03/19/19 is down to 8.7; discussed her target A1C is <7.0 - positive reinforcement given to patient and encouraged patient to keep up the good work; discussed patient received the printed diabetic educational materials and found them to be useful and easy to understand   . Collaborated with embedded Pharm D regarding patient's A1C from 03/19/19 is down to 8.7 . Discussed plans with patient for ongoing care management follow up and provided patient with direct contact information for care management team . Provided patient with printed educational materials related to healthy low carb snacks  Patient Self Care Activities:  . Self administers medications as prescribed . Attends all scheduled provider appointments . Calls pharmacy for medication refills . Performs ADL's independently . Performs IADL's independently . Calls provider office for new concerns or questions  Please see past updates related to this goal by clicking on the "Past Updates" button in the selected goal  The patient verbalized understanding of instructions provided today and declined a print copy of patient instruction materials.   Telephone follow up appointment with care management team member scheduled for: 04/01/19  [name], RN, BSN, CCM Care  Management Coordinator [place] [facility]  Direct Phone: [phone]

## 2019-03-21 NOTE — Progress Notes (Signed)
Subjective:     Patient ID: [name] , female    DOB: Mar 03, 1951 , 68 y.o.   MRN: [id number]   Chief Complaint  Patient presents with  . Diabetes  . Hypertension    HPI  She is here today for dm f/u. States she is feeling pretty good except for fatigue. Her sugars are improving. She has cut out sweet tea and sodas. She is happy to see her sugars are getting better.   Diabetes She presents for her follow-up diabetic visit. She has type 2 diabetes mellitus. Her disease course has been improving. There are no hypoglycemic associated symptoms. Associated symptoms include fatigue. Pertinent negatives for diabetes include no blurred vision and no chest pain. There are no hypoglycemic complications. Risk factors for coronary artery disease include diabetes mellitus, dyslipidemia, hypertension, post-menopausal and sedentary lifestyle. She is compliant with treatment most of the time. She is following a diabetic diet. She participates in exercise intermittently. An ACE inhibitor/angiotensin II receptor blocker is being taken.  Hypertension This is a chronic problem. The current episode started more than 1 year ago. The problem has been gradually improving since onset. The problem is uncontrolled. Pertinent negatives include no blurred vision, chest pain, palpitations or shortness of breath. The current treatment provides moderate improvement.     Past Medical History:  Diagnosis Date  . Arthritis    L knee, hands, back   . Asthma   . Diabetes mellitus type 2, insulin dependent ([place])   . Fibromyalgia   . Full dentures   . GERD (gastroesophageal reflux disease)   . H/O hiatal hernia   . Headache    h/o migraines - was followed for a time with a wellness doctor  . History of esophageal dilatation   . History of rhabdomyolysis    03/ 2015  . Hyperlipidemia   . Hypertension   . Insulin pump in place    since 12/ 2014--  [facility]  . Osteoarthritis of left knee, primary localized  08/17/2014  . Renal insufficiency   . SUI (stress urinary incontinence, female)   . Wears glasses      Family History  Problem Relation Age of Onset  . Diabetes Mother   . Hypertension Mother   . Lung cancer Mother        smoked  . Diabetes Father   . Hypertension Father   . Lung cancer Father        smoked  . Diabetes Brother   . Heart failure Sister   . Diabetes Maternal Grandmother   . Cancer Maternal Grandmother      Current Outpatient Medications:  .  albuterol (PROAIR HFA) 108 (90 Base) MCG/ACT inhaler, 2 puffs every 4 hours as needed only  if your can't catch your breath (Patient taking differently: Inhale 1-2 puffs into the lungs every 4 (four) hours as needed for wheezing. ), Disp: 1 Inhaler, Rfl: 11 .  amLODipine (NORVASC) 10 MG tablet, TAKE 1 TABLET(10 MG) BY MOUTH EVERY EVENING (Patient taking differently: Take 10 mg by mouth at bedtime. ), Disp: 90 tablet, Rfl: 0 .  Continuous Blood Gluc Receiver (FREESTYLE LIBRE READER) DEVI, by Does not apply route. CGM device-Freestyle Libre, Disp: , Rfl:  .  Continuous Blood Gluc Sensor (FREESTYLE LIBRE 14 DAY SENSOR) MISC, by Does not apply route. CGM 14-day sensors; send refills to [employer] [facility] (in Epic), Disp: , Rfl:  .  docusate sodium (COLACE) 100 MG capsule, Take 100 mg by mouth 2 (two) times  daily as needed for mild constipation., Disp: , Rfl:  .  gabapentin (NEURONTIN) 300 MG capsule, Take 1 capsule (300 mg total) by mouth 3 (three) times daily., Disp: 90 capsule, Rfl: 3 .  glucose blood test strip, Check blood sugar before meals and before bedtime, Disp: 300 each, Rfl: 3 .  insulin lispro (HUMALOG) 100 UNIT/ML injection, Inject 25 units in AM, 35 units at lunchtime, and 25 units at evening meal, Disp: 4 vial, Rfl: 1 .  Lancets Misc. MISC, 1 each by Does not apply route 4 (four) times daily -  with meals and at bedtime., Disp: 300 each, Rfl: 3 .  losartan (COZAAR) 50 MG tablet, Take 1 tablet (50 mg total) by mouth daily.,  Disp: 90 tablet, Rfl: 3 .  MYRBETRIQ 50 MG TB24 tablet, Take 1 tablet by mouth daily., Disp: , Rfl:  .  Nebivolol HCl (BYSTOLIC) 20 MG TABS, Take 1 tablet (20 mg total) by mouth daily., Disp: 90 tablet, Rfl: 1 .  OZEMPIC, 1 MG/DOSE, 2 MG/1.5ML SOPN, INJECT 1 MG INTO THE SKIN ONCE A WEEK (Patient taking differently: Inject 1 mg into the skin every 7 (seven) days. ), Disp: 6 pen, Rfl: 1 .  pantoprazole (PROTONIX) 40 MG tablet, Take 30- 60 min before your first and last meals of the day (Patient taking differently: Take 40 mg by mouth daily. ), Disp: 60 tablet, Rfl: 11 .  pravastatin (PRAVACHOL) 80 MG tablet, TAKE 1 TABLET BY MOUTH EVERY DAY (Patient taking differently: Take 40 mg by mouth daily. ), Disp: 90 tablet, Rfl: 0 .  spironolactone (ALDACTONE) 50 MG tablet, TAKE 1 TABLET(50 MG) BY MOUTH DAILY (Patient taking differently: Take 50 mg by mouth daily. ), Disp: 90 tablet, Rfl: 1 .  traMADol (ULTRAM) 50 MG tablet, Take 1-2 tablets (50-100 mg total) by mouth every 6 (six) hours as needed (pain)., Disp: 15 tablet, Rfl: 0 .  Vitamin D, Ergocalciferol, (DRISDOL) 1.25 MG (50000 UT) CAPS capsule, TAKE 1 CAPSULE BY MOUTH EVERY 7 DAYS ON WEDNESDAY (Patient taking differently: Take 50,000 Units by mouth every 7 (seven) days. ), Disp: 5 capsule, Rfl: 0   Allergies  Allergen Reactions  . Pollen Extract   . Tomato   . Codeine Hives, Itching and Nausea And Vomiting     Review of Systems  Constitutional: Positive for fatigue.  Eyes: Negative for blurred vision.  Respiratory: Negative.  Negative for shortness of breath.   Cardiovascular: Negative.  Negative for chest pain and palpitations.  Gastrointestinal: Negative.   Neurological: Negative.   Psychiatric/Behavioral: Negative.      Today's Vitals   03/19/19 1108  BP: 140/66  Pulse: (!) 56  Temp: 98.1 F (36.7 C)  TempSrc: Oral  Weight: 155 lb 3.2 oz (70.4 kg)  Height: 5\' 4"  (1.626 m)  PainSc: 5   PainLoc: Back   Body mass index is 26.64  kg/m.   Objective:  Physical Exam Vitals signs and nursing note reviewed.  Constitutional:      Appearance: Normal appearance.  HENT:     Head: Normocephalic and atraumatic.  Cardiovascular:     Rate and Rhythm: Normal rate and regular rhythm.     Heart sounds: Normal heart sounds.  Pulmonary:     Effort: Pulmonary effort is normal.     Breath sounds: Normal breath sounds.  Skin:    General: Skin is warm.  Neurological:     General: No focal deficit present.     Mental Status: She is alert.  Psychiatric:        Mood and Affect: Mood normal.        Behavior: Behavior normal.         Assessment And Plan:     1. Uncontrolled type 2 diabetes mellitus with hyperglycemia ([place])  I will check labs as listed below. She was congratulated on her lifestyle changes. I will make further recommendations once her labs are available for review.  - Hemoglobin A1c  2. HYPERTENSION, BENIGN ESSENTIAL  Fair control. She will continue with current meds. Importance of salt restriction was discussed with the patient.  3. Estrogen deficiency  She agrees to go to [facility] for bone density exam. Importance of engaging in weight-bearing exercises and calcium/vit d supplementation was discussed with the patient. Referral placed.   - DG Bone Density; Future  4. Fatigue, unspecified type  I have reviewed her most recent labs. She is encouraged to stay well hydrated. I will check LFTs today.   - Liver Profile  5. Need for vaccination   Maximino Greenland, MD    THE PATIENT IS ENCOURAGED TO PRACTICE SOCIAL DISTANCING DUE TO THE COVID-19 PANDEMIC.

## 2019-03-23 ENCOUNTER — Other Ambulatory Visit: Payer: Self-pay | Admitting: Internal Medicine

## 2019-03-23 DIAGNOSIS — Z1231 Encounter for screening mammogram for malignant neoplasm of breast: Secondary | ICD-10-CM

## 2019-03-24 ENCOUNTER — Other Ambulatory Visit: Payer: Self-pay

## 2019-03-24 ENCOUNTER — Ambulatory Visit (INDEPENDENT_AMBULATORY_CARE_PROVIDER_SITE_OTHER): Payer: Medicare Other

## 2019-03-24 VITALS — BP 130/80 | HR 86 | Temp 98.6°F | Ht 61.4 in | Wt 154.2 lb

## 2019-03-24 DIAGNOSIS — Z23 Encounter for immunization: Secondary | ICD-10-CM

## 2019-03-24 NOTE — Progress Notes (Signed)
Pt is here today for a pneumonia vaccine. Pt encouraged to call the office for any concerns. Side effects reviewed

## 2019-03-25 ENCOUNTER — Telehealth: Payer: Self-pay

## 2019-04-01 ENCOUNTER — Telehealth: Payer: Self-pay

## 2019-04-03 DIAGNOSIS — N3020 Other chronic cystitis without hematuria: Secondary | ICD-10-CM | POA: Diagnosis not present

## 2019-04-06 ENCOUNTER — Other Ambulatory Visit: Payer: Self-pay

## 2019-04-06 ENCOUNTER — Encounter (HOSPITAL_COMMUNITY): Payer: Self-pay | Admitting: *Deleted

## 2019-04-06 ENCOUNTER — Emergency Department (HOSPITAL_COMMUNITY): Payer: Medicare Other

## 2019-04-06 ENCOUNTER — Observation Stay (HOSPITAL_COMMUNITY)
Admission: EM | Admit: 2019-04-06 | Discharge: 2019-04-07 | Disposition: A | Discharge status: Home / Self Care | Payer: Medicare Other | Attending: Family Medicine | Admitting: Family Medicine

## 2019-04-06 ENCOUNTER — Ambulatory Visit: Payer: Self-pay | Admitting: Pharmacist

## 2019-04-06 DIAGNOSIS — K219 Gastro-esophageal reflux disease without esophagitis: Secondary | ICD-10-CM | POA: Insufficient documentation

## 2019-04-06 DIAGNOSIS — I129 Hypertensive chronic kidney disease with stage 1 through stage 4 chronic kidney disease, or unspecified chronic kidney disease: Secondary | ICD-10-CM | POA: Diagnosis not present

## 2019-04-06 DIAGNOSIS — R55 Syncope and collapse: Secondary | ICD-10-CM | POA: Diagnosis not present

## 2019-04-06 DIAGNOSIS — N390 Urinary tract infection, site not specified: Secondary | ICD-10-CM | POA: Diagnosis not present

## 2019-04-06 DIAGNOSIS — J4520 Mild intermittent asthma, uncomplicated: Secondary | ICD-10-CM

## 2019-04-06 DIAGNOSIS — S199XXA Unspecified injury of neck, initial encounter: Secondary | ICD-10-CM | POA: Diagnosis not present

## 2019-04-06 DIAGNOSIS — R51 Headache: Secondary | ICD-10-CM | POA: Diagnosis not present

## 2019-04-06 DIAGNOSIS — J45909 Unspecified asthma, uncomplicated: Secondary | ICD-10-CM | POA: Diagnosis not present

## 2019-04-06 DIAGNOSIS — Z03818 Encounter for observation for suspected exposure to other biological agents ruled out: Secondary | ICD-10-CM | POA: Diagnosis not present

## 2019-04-06 DIAGNOSIS — E1122 Type 2 diabetes mellitus with diabetic chronic kidney disease: Secondary | ICD-10-CM | POA: Insufficient documentation

## 2019-04-06 DIAGNOSIS — Z79899 Other long term (current) drug therapy: Secondary | ICD-10-CM | POA: Diagnosis not present

## 2019-04-06 DIAGNOSIS — Z1159 Encounter for screening for other viral diseases: Secondary | ICD-10-CM | POA: Insufficient documentation

## 2019-04-06 DIAGNOSIS — R079 Chest pain, unspecified: Secondary | ICD-10-CM | POA: Diagnosis not present

## 2019-04-06 DIAGNOSIS — H538 Other visual disturbances: Secondary | ICD-10-CM | POA: Diagnosis not present

## 2019-04-06 DIAGNOSIS — E1165 Type 2 diabetes mellitus with hyperglycemia: Secondary | ICD-10-CM | POA: Diagnosis present

## 2019-04-06 DIAGNOSIS — N183 Chronic kidney disease, stage 3 unspecified: Secondary | ICD-10-CM | POA: Diagnosis present

## 2019-04-06 DIAGNOSIS — E785 Hyperlipidemia, unspecified: Secondary | ICD-10-CM | POA: Insufficient documentation

## 2019-04-06 DIAGNOSIS — I160 Hypertensive urgency: Secondary | ICD-10-CM | POA: Diagnosis not present

## 2019-04-06 DIAGNOSIS — Z885 Allergy status to narcotic agent status: Secondary | ICD-10-CM | POA: Diagnosis not present

## 2019-04-06 DIAGNOSIS — E119 Type 2 diabetes mellitus without complications: Secondary | ICD-10-CM | POA: Diagnosis present

## 2019-04-06 DIAGNOSIS — Z794 Long term (current) use of insulin: Secondary | ICD-10-CM | POA: Insufficient documentation

## 2019-04-06 DIAGNOSIS — I10 Essential (primary) hypertension: Secondary | ICD-10-CM | POA: Diagnosis present

## 2019-04-06 DIAGNOSIS — IMO0002 Reserved for concepts with insufficient information to code with codable children: Secondary | ICD-10-CM | POA: Diagnosis present

## 2019-04-06 DIAGNOSIS — S0990XA Unspecified injury of head, initial encounter: Secondary | ICD-10-CM | POA: Diagnosis not present

## 2019-04-06 DIAGNOSIS — K449 Diaphragmatic hernia without obstruction or gangrene: Secondary | ICD-10-CM | POA: Insufficient documentation

## 2019-04-06 DIAGNOSIS — M797 Fibromyalgia: Secondary | ICD-10-CM | POA: Diagnosis not present

## 2019-04-06 HISTORY — DX: Urinary tract infection, site not specified: N39.0

## 2019-04-06 LAB — CBC
HCT: 43.7 % (ref 36.0–46.0)
Hemoglobin: 14.1 g/dL (ref 12.0–15.0)
MCH: 29.7 pg (ref 26.0–34.0)
MCHC: 32.3 g/dL (ref 30.0–36.0)
MCV: 92.2 fL (ref 80.0–100.0)
Platelets: 225 10*3/uL (ref 150–400)
RBC: 4.74 MIL/uL (ref 3.87–5.11)
RDW: 11.8 % (ref 11.5–15.5)
WBC: 4.1 10*3/uL (ref 4.0–10.5)
nRBC: 0 % (ref 0.0–0.2)

## 2019-04-06 LAB — BASIC METABOLIC PANEL
Anion gap: 10 (ref 5–15)
BUN: 13 mg/dL (ref 8–23)
CO2: 26 mmol/L (ref 22–32)
Calcium: 9.3 mg/dL (ref 8.9–10.3)
Chloride: 100 mmol/L (ref 98–111)
Creatinine, Ser: 1.3 mg/dL — ABNORMAL HIGH (ref 0.44–1.00)
GFR calc Af Amer: 50 mL/min — ABNORMAL LOW (ref >60)
GFR calc non Af Amer: 43 mL/min — ABNORMAL LOW (ref >60)
Glucose, Bld: 242 mg/dL — ABNORMAL HIGH (ref 70–99)
Potassium: 4.2 mmol/L (ref 3.5–5.1)
Sodium: 136 mmol/L (ref 135–145)

## 2019-04-06 LAB — URINALYSIS, ROUTINE W REFLEX MICROSCOPIC
Bilirubin Urine: NEGATIVE
Glucose, UA: 50 mg/dL — AB
Ketones, ur: 5 mg/dL — AB
Nitrite: POSITIVE — AB
Protein, ur: 30 mg/dL — AB
Specific Gravity, Urine: 1.018 (ref 1.005–1.030)
pH: 5 (ref 5.0–8.0)

## 2019-04-06 LAB — CBG MONITORING, ED: Glucose-Capillary: 130 mg/dL — ABNORMAL HIGH (ref 70–99)

## 2019-04-06 LAB — TROPONIN I (HIGH SENSITIVITY): Troponin I (High Sensitivity): 5 ng/L (ref <18)

## 2019-04-06 MED ORDER — LABETALOL HCL 5 MG/ML IV SOLN: 10.0000 mg | INTRAVENOUS | Status: DC | PRN

## 2019-04-06 MED ORDER — INSULIN DETEMIR 100 UNIT/ML ~~LOC~~ SOLN
20.0000 [IU] | Freq: Two times a day (BID) | SUBCUTANEOUS | Status: DC
  Administered 2019-04-07 (×2): 20 [IU] via SUBCUTANEOUS
  Filled 2019-04-06 (×3): qty 0.2

## 2019-04-06 MED ORDER — INSULIN ASPART 100 UNIT/ML ~~LOC~~ SOLN
0.0000 [IU] | Freq: Three times a day (TID) | SUBCUTANEOUS | Status: DC
  Administered 2019-04-07: 1 [IU] via SUBCUTANEOUS
  Administered 2019-04-07: 08:00:00 2 [IU] via SUBCUTANEOUS

## 2019-04-06 MED ORDER — INSULIN ASPART 100 UNIT/ML ~~LOC~~ SOLN: 0.0000 [IU] | Freq: Every day | SUBCUTANEOUS | Status: DC

## 2019-04-06 MED ORDER — SODIUM CHLORIDE 0.9 % IV SOLN
1.0000 g | INTRAVENOUS | Status: DC
  Administered 2019-04-06: 1 g via INTRAVENOUS
  Filled 2019-04-06: qty 10

## 2019-04-06 MED ORDER — SODIUM CHLORIDE 0.9% FLUSH: 3.0000 mL | Freq: Once | INTRAVENOUS | Status: DC

## 2019-04-06 MED ORDER — INSULIN DETEMIR 100 UNIT/ML ~~LOC~~ SOLN: 15.0000 [IU] | Freq: Two times a day (BID) | SUBCUTANEOUS | Status: DC

## 2019-04-06 NOTE — H&P (Signed)
History and Physical    [name]:[id number] DOB: 07/05/1951 DOA: 04/06/2019  PCP: [name], MD   Patient coming from: Home   Chief Complaint: Syncope   HPI: [name] is a 68 y.o. female with medical history significant for hypertension, asthma, insulin-dependent diabetes mellitus, recurrent UTI, and arthritis, now presenting to the emergency department for evaluation of syncope.  Patient reports that she been in her usual state of health, was having an uneventful day, and had just stepped out of the shower when she suffered a loss of consciousness.  Patient denies any preceding lightheadedness, nausea, chest pain, or palpitations, but her roommate heard her fall, and the patient then remembers waking up on the floor of her bathroom.  She had some pain in the back of her head after this episode, but otherwise returned to her usual state fairly quickly.  The roommate did not observe any seizure-like activity.  Patient reports that this event is similar to what she experienced a little over a year ago.  She denies any recent fevers, chills, shortness of breath, or cough.  She reports that she continues to have some mild dysuria, though that has been improving with an antibiotic.  ED Course: Upon arrival to the ED, patient is found to be afebrile, saturating well on room air, and hypertensive 290/100.  EKG features a sinus rhythm.  Noncontrast head CT is negative for acute intracranial abnormality and there is no acute findings on cervical spine CT.  Chemistry panel is notable for creatinine 1.30, similar to priors.  CBC is unremarkable and initial high-sensitivity troponin is normal.  Urinalysis suggestive of possible infection.  Patient was treated with Rocephin in the emergency department and hospitalists are consulted for admission.  Review of Systems:  All other systems reviewed and apart from HPI, are negative.  Past Medical History:  Diagnosis Date  . Arthritis     L knee, hands, back   . Asthma   . Diabetes mellitus type 2, insulin dependent ([place])   . Fibromyalgia   . Full dentures   . GERD (gastroesophageal reflux disease)   . H/O hiatal hernia   . Headache    h/o migraines - was followed for a time with a wellness doctor  . History of esophageal dilatation   . History of rhabdomyolysis    03/ 2015  . Hyperlipidemia   . Hypertension   . Insulin pump in place    since 12/ 2014--  [employer]  . Osteoarthritis of left knee, primary localized 08/17/2014  . Renal insufficiency   . SUI (stress urinary incontinence, female)   . Wears glasses     Past Surgical History:  Procedure Laterality Date  . BLADDER SUSPENSION N/A 03/09/2014   Procedure: CYSTOSCOPY/SLING;  Surgeon: Reece Packer, MD;  Location[facility];  Service: Urology;  Laterality: N/A;  . CARDIAC CATHETERIZATION  07-20-2009   DR Shelva Majestic   MODERATE LVH/  NORMAL  LVEF/  NORMAL CORONARY AND RENAL ARTERIES  . CARPAL TUNNEL RELEASE Right 2000  . CHOLECYSTECTOMY  1990  . COLONOSCOPY WITH ESOPHAGOGASTRODUODENOSCOPY (EGD)  06-18-2002  . ESOPHAGOGASTRODUODENOSCOPY (EGD) WITH ESOPHAGEAL DILATION  06-12-2010  . EXCISION LEFT UPPER ARM MASS  01-29-2014  . EYE SURGERY  2010   laser on R eye  . INTERSTIM IMPLANT REMOVAL N/A 03/06/2019   Procedure: REMOVAL OF INTERSTIM IMPLANT;  Surgeon: Bjorn Loser, MD;  Location: [facility];  Service: Urology;  Laterality: N/A;  . KNEE ARTHROSCOPY Right  1989  &  2002  . NEGATIVE SLEEP STUDY  2012   PER PT  . PARTIAL KNEE ARTHROPLASTY Left 08/17/2014   Procedure: LEFT UNICOMPARTMENTAL KNEE;  Surgeon: Johnny Bridge, MD;  Location: [place];  Service: Orthopedics;  Laterality: Left;     reports that she has never smoked. She has never used smokeless tobacco. She reports that she does not drink alcohol or use drugs.  Allergies  Allergen Reactions  . Pollen Extract Other (See Comments)    Congestion/sneezing  . Tomato Hives and  Itching  . Codeine Hives, Itching and Nausea And Vomiting    Family History  Problem Relation Age of Onset  . Diabetes Mother   . Hypertension Mother   . Lung cancer Mother        smoked  . Diabetes Father   . Hypertension Father   . Lung cancer Father        smoked  . Diabetes Brother   . Heart failure Sister   . Diabetes Maternal Grandmother   . Cancer Maternal Grandmother      Prior to Admission medications   Medication Sig Start Date End Date Taking? Authorizing Provider  albuterol (PROAIR HFA) 108 (90 Base) MCG/ACT inhaler 2 puffs every 4 hours as needed only  if your can't catch your breath Patient taking differently: Inhale 1-2 puffs into the lungs every 4 (four) hours as needed for wheezing or shortness of breath.  01/20/16  Yes Tanda Rockers, MD  amLODipine (NORVASC) 10 MG tablet TAKE 1 TABLET(10 MG) BY MOUTH EVERY EVENING Patient taking differently: Take 10 mg by mouth at bedtime.  02/23/19  Yes [name], FNP  docusate sodium (COLACE) 100 MG capsule Take 100 mg by mouth 2 (two) times daily as needed for mild constipation.   Yes [provider]  gabapentin (NEURONTIN) 300 MG capsule Take 1 capsule (300 mg total) by mouth 3 (three) times daily. 06/09/18  Yes [name], FNP  insulin lispro (HUMALOG) 100 UNIT/ML injection Inject 25 units in AM, 35 units at lunchtime, and 25 units at evening meal Patient taking differently: Inject 25-35 Units into the skin See admin instructions. Inject 25 units subcutaneously before breakfast and supper, inject 35 units before lunch 11/20/18 11/20/19 Yes [name], FNP  losartan (COZAAR) 50 MG tablet Take 1 tablet (50 mg total) by mouth daily. 11/11/15  Yes Elayne Snare, MD  mirabegron ER (MYRBETRIQ) 50 MG TB24 tablet Take 50 mg by mouth daily.   Yes [provider]  Multiple Vitamin (MULTIVITAMIN WITH MINERALS) TABS tablet Take 1 tablet by mouth daily.   Yes [provider]  naproxen sodium (ALEVE) 220 MG  tablet Take 220 mg by mouth 2 (two) times daily as needed (pain).   Yes [provider]  Nebivolol HCl (BYSTOLIC) 20 MG TABS Take 1 tablet (20 mg total) by mouth daily. 11/20/18  Yes [name], FNP  nitrofurantoin (MACRODANTIN) 100 MG capsule Take 100 mg by mouth daily. 03/16/19  Yes [provider]  OZEMPIC, 1 MG/DOSE, 2 MG/1.5ML SOPN INJECT 1 MG INTO THE SKIN ONCE A WEEK Patient taking differently: Inject 1 mg into the skin every Thursday.  01/14/19  Yes [name], FNP  pantoprazole (PROTONIX) 40 MG tablet Take 30- 60 min before your first and last meals of the day Patient taking differently: Take 40 mg by mouth daily before breakfast.  12/01/15  Yes Tanda Rockers, MD  pravastatin (PRAVACHOL) 80 MG tablet TAKE 1 TABLET BY  MOUTH EVERY DAY Patient taking differently: Take 40 mg by mouth daily.  07/21/18  Yes [name], FNP  spironolactone (ALDACTONE) 50 MG tablet TAKE 1 TABLET(50 MG) BY MOUTH DAILY Patient taking differently: Take 50 mg by mouth daily.  12/08/18  Yes [name], FNP  Vitamin D, Ergocalciferol, (DRISDOL) 1.25 MG (50000 UT) CAPS capsule TAKE 1 CAPSULE BY MOUTH EVERY 7 DAYS ON [facility] Patient taking differently: Take 50,000 Units by mouth every Thursday.  07/14/18  Yes [name], FNP  Continuous Blood Gluc Receiver (FREESTYLE LIBRE READER) DEVI by Does not apply route. CGM device-[employer], Historical, MD  Continuous Blood Gluc Sensor (FREESTYLE LIBRE 14 DAY SENSOR) MISC by Does not apply route. CGM 14-day sensors; send refills to [employer] [facility] (in [place])    [provider]  glucose blood test strip Check blood sugar before meals and before bedtime 12/16/18   [name], FNP  Lancets Misc. MISC 1 each by Does not apply route 4 (four) times daily -  with meals and at bedtime. 12/16/18   [name], FNP  traMADol (ULTRAM) 50 MG tablet Take 1-2 tablets (50-100 mg total) by mouth every 6 (six) hours as needed  (pain). Patient not taking: Reported on 04/06/2019 03/07/19   Raynelle Bring, MD    Physical Exam: Vitals:   04/06/19 2000 04/06/19 2015 04/06/19 2115 04/06/19 2245  BP:  (!) 177/121 (!) 153/100 (!) 154/77  Pulse:  70 72 79  Resp:  15 10 15   Temp:      TempSrc:      SpO2:  100% 99% 100%  Weight: 69.9 kg     Height: 5' 1.4" (1.56 m)       Constitutional: NAD, calm  Eyes: PERTLA, lids and conjunctivae normal ENMT: Mucous membranes are moist. Posterior pharynx clear of any exudate or lesions.   Neck: normal, supple, no masses, no thyromegaly Respiratory: clear to auscultation bilaterally, no wheezing, no crackles. Normal respiratory effort. No accessory muscle use.  Cardiovascular: S1 & S2 heard, regular rate and rhythm. No extremity edema.   Abdomen: No distension, no tenderness, soft. Bowel sounds normal.  Musculoskeletal: no clubbing / cyanosis. No joint deformity upper and lower extremities.   Skin: no significant rashes, lesions, ulcers. Warm, dry, well-perfused. Neurologic: CN 2-12 grossly intact. Sensation intact. Strength 5/5 in all 4 limbs.  Psychiatric: Alert and oriented x 3. Very pleasant, cooperative.    Labs on Admission: I have personally reviewed following labs and imaging studies  CBC: Recent Labs  Lab 04/06/19 1323  WBC 4.1  HGB 14.1  HCT 43.7  MCV 92.2  PLT 408   Basic Metabolic Panel: Recent Labs  Lab 04/06/19 1323  NA 136  K 4.2  CL 100  CO2 26  GLUCOSE 242*  BUN 13  CREATININE 1.30*  CALCIUM 9.3   GFR: Estimated Creatinine Clearance: 38.4 mL/min (A) (by C-G formula based on SCr of 1.3 mg/dL (H)). Liver Function Tests: No results for input(s): AST, ALT, ALKPHOS, BILITOT, PROT, ALBUMIN in the last 168 hours. No results for input(s): LIPASE, AMYLASE in the last 168 hours. No results for input(s): AMMONIA in the last 168 hours. Coagulation Profile: No results for input(s): INR, PROTIME in the last 168 hours. Cardiac Enzymes: No results for  input(s): CKTOTAL, CKMB, CKMBINDEX, TROPONINI in the last 168 hours. BNP (last 3 results) No results for input(s): PROBNP in the last 8760 hours. HbA1C: No results for input(s): HGBA1C in the last 72 hours.  CBG: Recent Labs  Lab 04/06/19 1958  GLUCAP 130*   Lipid Profile: No results for input(s): CHOL, HDL, LDLCALC, TRIG, CHOLHDL, LDLDIRECT in the last 72 hours. Thyroid Function Tests: No results for input(s): TSH, T4TOTAL, FREET4, T3FREE, THYROIDAB in the last 72 hours. Anemia Panel: No results for input(s): VITAMINB12, FOLATE, FERRITIN, TIBC, IRON, RETICCTPCT in the last 72 hours. Urine analysis:    Component Value Date/Time   COLORURINE YELLOW 04/06/2019 2249   APPEARANCEUR CLOUDY (A) 04/06/2019 2249   LABSPEC 1.018 04/06/2019 2249   PHURINE 5.0 04/06/2019 2249   GLUCOSEU 50 (A) 04/06/2019 2249   GLUCOSEU >=1000 03/16/2013 1259   HGBUR SMALL (A) 04/06/2019 2249   HGBUR trace-intact 11/17/2010 1230   BILIRUBINUR NEGATIVE 04/06/2019 2249   BILIRUBINUR neg 12/04/2013 1652   KETONESUR 5 (A) 04/06/2019 2249   PROTEINUR 30 (A) 04/06/2019 2249   UROBILINOGEN negative 12/04/2013 1652   UROBILINOGEN 0.2 11/15/2013 0040   NITRITE POSITIVE (A) 04/06/2019 2249   LEUKOCYTESUR LARGE (A) 04/06/2019 2249   Sepsis Labs: @LABRCNTIP (procalcitonin:4,lacticidven:4) )No results found for this or any previous visit (from the past 240 hour(s)).   Radiological Exams on Admission: Ct Head Wo Contrast  Result Date: 04/06/2019 CLINICAL DATA:  Headache, post traumaticC-spine trauma, ligamentous injury suspectedPt stated she had a fall today but don't remember where she fell except that she was at home. EXAM: CT HEAD WITHOUT CONTRAST CT CERVICAL SPINE WITHOUT CONTRAST TECHNIQUE: Multidetector CT imaging of the head and cervical spine was performed following the standard protocol without intravenous contrast. Multiplanar CT image reconstructions of the cervical spine were also generated. COMPARISON:   10/01/2018 FINDINGS: CT HEAD FINDINGS Brain: No evidence of acute infarction, hemorrhage, hydrocephalus, extra-axial collection or mass lesion/mass effect. Patchy white matter hypoattenuation is noted consistent with moderate chronic microvascular ischemic change. Vascular: No hyperdense vessel or unexpected calcification. Skull: Normal. Negative for fracture or focal lesion. Sinuses/Orbits: Globes and orbits are unremarkable. The visualized sinuses and mastoid air cells are clear. Other: Small left forehead scalp hematoma. CT CERVICAL SPINE FINDINGS Alignment: Mild kyphosis, apex at C5-C6. No spondylolisthesis/subluxation. Skull base and vertebrae: No acute fracture. No primary bone lesion or focal pathologic process. Soft tissues and spinal canal: No prevertebral fluid or swelling. No visible canal hematoma. Disc levels: Moderate loss of disc height at C5-C6 and C6-C7. Endplate spurring and spondylotic disc bulging is noted most prominently at C5-C6. Mild facet degenerative change on the right at C4-C5. No convincing disc herniation. Upper chest: No acute findings.  Clear lung apices. Other: None. IMPRESSION: HEAD CT 1. No acute intracranial abnormalities. 2. Moderate chronic microvascular ischemic change, advanced for age, stable from prior head CT. 3. Small left frontal scalp contusion. CERVICAL CT 1. No fracture or acute finding. Electronically Signed   By: Lajean Manes M.D.   On: 04/06/2019 20:46   Ct Cervical Spine Wo Contrast  Result Date: 04/06/2019 CLINICAL DATA:  Headache, post traumaticC-spine trauma, ligamentous injury suspectedPt stated she had a fall today but don't remember where she fell except that she was at home. EXAM: CT HEAD WITHOUT CONTRAST CT CERVICAL SPINE WITHOUT CONTRAST TECHNIQUE: Multidetector CT imaging of the head and cervical spine was performed following the standard protocol without intravenous contrast. Multiplanar CT image reconstructions of the cervical spine were also  generated. COMPARISON:  10/01/2018 FINDINGS: CT HEAD FINDINGS Brain: No evidence of acute infarction, hemorrhage, hydrocephalus, extra-axial collection or mass lesion/mass effect. Patchy white matter hypoattenuation is noted consistent with moderate chronic microvascular ischemic change. Vascular: No  hyperdense vessel or unexpected calcification. Skull: Normal. Negative for fracture or focal lesion. Sinuses/Orbits: Globes and orbits are unremarkable. The visualized sinuses and mastoid air cells are clear. Other: Small left forehead scalp hematoma. CT CERVICAL SPINE FINDINGS Alignment: Mild kyphosis, apex at C5-C6. No spondylolisthesis/subluxation. Skull base and vertebrae: No acute fracture. No primary bone lesion or focal pathologic process. Soft tissues and spinal canal: No prevertebral fluid or swelling. No visible canal hematoma. Disc levels: Moderate loss of disc height at C5-C6 and C6-C7. Endplate spurring and spondylotic disc bulging is noted most prominently at C5-C6. Mild facet degenerative change on the right at C4-C5. No convincing disc herniation. Upper chest: No acute findings.  Clear lung apices. Other: None. IMPRESSION: HEAD CT 1. No acute intracranial abnormalities. 2. Moderate chronic microvascular ischemic change, advanced for age, stable from prior head CT. 3. Small left frontal scalp contusion. CERVICAL CT 1. No fracture or acute finding. Electronically Signed   By: Lajean Manes M.D.   On: 04/06/2019 20:46    EKG: Independently reviewed. Sinus rhythm, rate 81, QTc 450 ms.   Assessment/Plan   1. Syncope  - Presents after a transient loss of consciousness without prodrome and no seizure-like activity or postictal period noted by roommate  - Continue cardiac monitoring, check orthostatic vitals, check echocardiogram    2. Hypertension with hypertensive urgency  - BP as high as 190/100 in ED  - Continue Norvasc, nebivolol, losartan, and Aldactone; use labetalol IVP's as needed   3.  Recurrent UTI   - Patient reports dysuria and UA is compatible with infection and sample sent for culture  - She was started on Rocephin in ED, will continue while following culture and clinical response   4. CKD stage III  - SCr is 1.30 on admission, similar to priors  - Renally-dose medications   5. Insulin-dependent DM  - A1c was 8.7% in July 2020  - Check CBG's, continue insulin   6. Asthma  - Stable, continue as-needed albuterol     PPE: Mask, face shield  DVT prophylaxis: Lovenox  Code Status: Full  Family Communication: Discussed with patient  Consults called: None  Admission status: Observation     Vianne Bulls, MD [facility] Pager [phone]  If 7PM-7AM, please contact night-coverage [email or web address] Password [email or web address]  04/06/2019, 11:22 PM

## 2019-04-06 NOTE — ED Notes (Signed)
Patient transported to CT 

## 2019-04-06 NOTE — ED Provider Notes (Signed)
[facility] EMERGENCY DEPARTMENT Provider Note   CSN: [id number] Arrival date & time: 04/06/19  1320     History   Chief Complaint Chief Complaint  Patient presents with  . Loss of Consciousness    HPI [name] is a 68 y.o. female.     68 year old female presents after having a witnessed syncopal event.  Has had this before in the past without diagnosis.  Denies any recent illnesses.  States that she was in the bathroom and passed out and awoke on the floor.  Did not describe any postictal period.  No bowel or bladder dysfunction.  Complains of pain to her occiput and upper neck without numbness or tingling in her upper or lower extremities.  Denies any recent chest or abdominal discomfort.  No recent medication changes.  EMS called and patient transported here     Past Medical History:  Diagnosis Date  . Arthritis    L knee, hands, back   . Asthma   . Diabetes mellitus type 2, insulin dependent ([place])   . Fibromyalgia   . Full dentures   . GERD (gastroesophageal reflux disease)   . H/O hiatal hernia   . Headache    h/o migraines - was followed for a time with a wellness doctor  . History of esophageal dilatation   . History of rhabdomyolysis    03/ 2015  . Hyperlipidemia   . Hypertension   . Insulin pump in place    since 12/ 2014--  [employer]  . Osteoarthritis of left knee, primary localized 08/17/2014  . Renal insufficiency   . SUI (stress urinary incontinence, female)   . Wears glasses     Patient Active Problem List   Diagnosis Date Noted  . Wound infection 03/06/2019  . Localized swelling, mass and lump, multiple sites 03/27/2018  . Type II diabetes mellitus with renal manifestations ([place]) 12/27/2017  . HLD (hyperlipidemia) 12/27/2017  . HTN (hypertension) 12/27/2017  . Acute renal failure superimposed on stage 3 chronic kidney disease ([place]) 12/27/2017  . Syncope 12/27/2017  . Cough variant asthma 12/01/2015  .  Osteoarthritis of left knee, primary localized 08/17/2014  . Knee osteoarthritis 08/17/2014  . Insulin pump in place 12/21/2013  . Rhabdomyolysis 11/15/2013  . Elevated lactic acid level 11/15/2013  . Hypoglycemia 11/15/2013  . Other and unspecified hyperlipidemia 08/06/2013  . Type II diabetes mellitus, uncontrolled ([place]) 07/20/2013  . Varicose veins of lower extremities with other complications 35/00/9381  . Fibromyalgia   . GERD 05/18/2010  . URINARY TRACT INFECTION, MONILIAL 05/05/2010  . UTI 05/02/2010  . ABDOMINAL PAIN, GENERALIZED 05/02/2010  . OBESITY 04/20/2010  . BURSITIS, LEFT SHOULDER 03/09/2010  . CANDIDIASIS OF VULVA AND VAGINA 01/10/2010  . Lipoma of arm s/p excision 01/29/2014 01/10/2010  . DEPRESSION 12/27/2009  . BACK PAIN WITH RADICULOPATHY 12/27/2009  . CYSTITIS, ACUTE 10/21/2009  . MICROSCOPIC HEMATURIA 10/18/2009  . KNEE PAIN, BILATERAL 10/18/2009  . NEUROPATHY 09/08/2009  . HYPERTENSION, BENIGN ESSENTIAL 09/08/2009  . Asthma 09/08/2009  . STRESS INCONTINENCE 09/08/2009  . CHEST PAIN 07/19/2009  . ESOPHAGEAL STRICTURE 08/27/2005  . Diabetes mellitus without complication ([place]) 82/99/3716    Past Surgical History:  Procedure Laterality Date  . BLADDER SUSPENSION N/A 03/09/2014   Procedure: CYSTOSCOPY/SLING;  Surgeon: Reece Packer, MD;  Location: [facility];  Service: Urology;  Laterality: N/A;  . CARDIAC CATHETERIZATION  07-20-2009   [facility] [place]  NORMAL  LVEF/  NORMAL  CORONARY AND RENAL ARTERIES  . CARPAL TUNNEL RELEASE Right 2000  . CHOLECYSTECTOMY  1990  . COLONOSCOPY WITH ESOPHAGOGASTRODUODENOSCOPY (EGD)  06-18-2002  . ESOPHAGOGASTRODUODENOSCOPY (EGD) WITH ESOPHAGEAL DILATION  06-12-2010  . EXCISION LEFT UPPER ARM MASS  01-29-2014  . EYE SURGERY  2010   laser on R eye  . INTERSTIM IMPLANT REMOVAL N/A 03/06/2019   Procedure: REMOVAL OF INTERSTIM IMPLANT;  Surgeon: Bjorn Loser, MD;  Location: [facility];   Service: Urology;  Laterality: N/A;  . KNEE ARTHROSCOPY Right 1989  &  2002  . NEGATIVE SLEEP STUDY  2012   PER PT  . PARTIAL KNEE ARTHROPLASTY Left 08/17/2014   Procedure: LEFT UNICOMPARTMENTAL KNEE;  Surgeon: Johnny Bridge, MD;  Location: [facility];  Service: Orthopedics;  Laterality: Left;     OB History    Gravida  1   Para  1   Term  1   Preterm      AB      Living  1     SAB      TAB      Ectopic      Multiple      Live Births  1            Home Medications    Prior to Admission medications   Medication Sig Start Date End Date Taking? Authorizing Provider  albuterol (PROAIR HFA) 108 (90 Base) MCG/ACT inhaler 2 puffs every 4 hours as needed only  if your can't catch your breath Patient taking differently: Inhale 1-2 puffs into the lungs every 4 (four) hours as needed for wheezing.  01/20/16   Tanda Rockers, MD  amLODipine (NORVASC) 10 MG tablet TAKE 1 TABLET(10 MG) BY MOUTH EVERY EVENING Patient taking differently: Take 10 mg by mouth at bedtime.  02/23/19   [name], FNP  Continuous Blood Gluc Receiver (FREESTYLE LIBRE READER) DEVI by Does not apply route. CGM device-[employer], Historical, MD  Continuous Blood Gluc Sensor (FREESTYLE LIBRE 14 DAY SENSOR) MISC by Does not apply route. CGM 14-day sensors; send refills to [employer] (in [place])    [provider]  docusate sodium (COLACE) 100 MG capsule Take 100 mg by mouth 2 (two) times daily as needed for mild constipation.    [provider]  gabapentin (NEURONTIN) 300 MG capsule Take 1 capsule (300 mg total) by mouth 3 (three) times daily. 06/09/18   [name], FNP  glucose blood test strip Check blood sugar before meals and before bedtime 12/16/18   [name], FNP  insulin lispro (HUMALOG) 100 UNIT/ML injection Inject 25 units in AM, 35 units at lunchtime, and 25 units at evening meal 11/20/18 11/20/19  [name], FNP  Lancets Misc. MISC 1 each by Does not  apply route 4 (four) times daily -  with meals and at bedtime. 12/16/18   [name], FNP  losartan (COZAAR) 50 MG tablet Take 1 tablet (50 mg total) by mouth daily. 11/11/15   Elayne Snare, MD  MYRBETRIQ 50 MG TB24 tablet Take 1 tablet by mouth daily. 02/16/19   [provider]  Nebivolol HCl (BYSTOLIC) 20 MG TABS Take 1 tablet (20 mg total) by mouth daily. 11/20/18   [name], FNP  OZEMPIC, 1 MG/DOSE, 2 MG/1.5ML SOPN INJECT 1 MG INTO THE SKIN ONCE A WEEK Patient taking differently: Inject 1 mg into the skin every 7 (seven) days.  01/14/19   [name], FNP  pantoprazole (PROTONIX) 40 MG  tablet Take 30- 60 min before your first and last meals of the day Patient taking differently: Take 40 mg by mouth daily.  12/01/15   Tanda Rockers, MD  pravastatin (PRAVACHOL) 80 MG tablet TAKE 1 TABLET BY MOUTH EVERY DAY Patient taking differently: Take 40 mg by mouth daily.  07/21/18   [name], FNP  spironolactone (ALDACTONE) 50 MG tablet TAKE 1 TABLET(50 MG) BY MOUTH DAILY Patient taking differently: Take 50 mg by mouth daily.  12/08/18   [name], FNP  traMADol (ULTRAM) 50 MG tablet Take 1-2 tablets (50-100 mg total) by mouth every 6 (six) hours as needed (pain). 03/07/19   Raynelle Bring, MD  Vitamin D, Ergocalciferol, (DRISDOL) 1.25 MG (50000 UT) CAPS capsule TAKE 1 CAPSULE BY MOUTH EVERY 7 DAYS ON [facility] Patient taking differently: Take 50,000 Units by mouth every 7 (seven) days.  07/14/18   [name], FNP    Family History Family History  Problem Relation Age of Onset  . Diabetes Mother   . Hypertension Mother   . Lung cancer Mother        smoked  . Diabetes Father   . Hypertension Father   . Lung cancer Father        smoked  . Diabetes Brother   . Heart failure Sister   . Diabetes Maternal Grandmother   . Cancer Maternal Grandmother     Social History Social History   Tobacco Use  . Smoking status: Never Smoker  . Smokeless tobacco: Never Used   Substance Use Topics  . Alcohol use: No  . Drug use: No     Allergies   Pollen extract, Tomato, and Codeine   Review of Systems Review of Systems  All other systems reviewed and are negative.    Physical Exam Updated Vital Signs BP (!) 192/89 (BP Location: Right Arm)   Pulse 87   Temp 98.2 F (36.8 C) (Oral)   Resp 13   Ht 1.56 m (5' 1.4")   Wt 69.9 kg   LMP  (LMP Unknown)   SpO2 97%   BMI 28.76 kg/m   Physical Exam Vitals signs and nursing note reviewed.  Constitutional:      General: She is not in acute distress.    Appearance: Normal appearance. She is well-developed. She is not toxic-appearing.  HENT:     Head: Normocephalic and atraumatic.  Eyes:     General: Lids are normal.     Conjunctiva/sclera: Conjunctivae normal.     Pupils: Pupils are equal, round, and reactive to light.  Neck:     Musculoskeletal: Normal range of motion and neck supple. Muscular tenderness present. No spinous process tenderness.     Thyroid: No thyroid mass.     Trachea: No tracheal deviation.  Cardiovascular:     Rate and Rhythm: Normal rate and regular rhythm.     Heart sounds: Normal heart sounds. No murmur. No gallop.   Pulmonary:     Effort: Pulmonary effort is normal. No respiratory distress.     Breath sounds: Normal breath sounds. No stridor. No decreased breath sounds, wheezing, rhonchi or rales.  Abdominal:     General: Bowel sounds are normal. There is no distension.     Palpations: Abdomen is soft.     Tenderness: There is no abdominal tenderness. There is no rebound.  Musculoskeletal: Normal range of motion.        General: No tenderness.  Skin:    General: Skin is warm and  dry.     Findings: No abrasion or rash.  Neurological:     Mental Status: She is alert and oriented to person, place, and time.     GCS: GCS eye subscore is 4. GCS verbal subscore is 5. GCS motor subscore is 6.     Cranial Nerves: No cranial nerve deficit.     Sensory: No sensory deficit.      Motor: No weakness or tremor.     Coordination: Coordination normal.     Gait: Gait normal.  Psychiatric:        Speech: Speech normal.        Behavior: Behavior normal.      ED Treatments / Results  Labs (all labs ordered are listed, but only abnormal results are displayed) Labs Reviewed  BASIC METABOLIC PANEL - Abnormal; Notable for the following components:      Result Value   Glucose, Bld 242 (*)    Creatinine, Ser 1.30 (*)    GFR calc non Af Amer 43 (*)    GFR calc Af Amer 50 (*)    All other components within normal limits  CBG MONITORING, ED - Abnormal; Notable for the following components:   Glucose-Capillary 130 (*)    All other components within normal limits  CBC  URINALYSIS, ROUTINE W REFLEX MICROSCOPIC    EKG EKG Interpretation  Date/Time:  Monday April 06 2019 13:31:44 EDT Ventricular Rate:  81 PR Interval:  150 QRS Duration: 84 QT Interval:  388 QTC Calculation: 450 R Axis:   69 Text Interpretation:  Normal sinus rhythm Possible Anterior infarct , age undetermined Abnormal ECG No significant change since last tracing Confirmed by Dorie Rank [phone]) on 04/06/2019 7:29:05 PM   Radiology No results found.  Procedures Procedures (including critical care time)  Medications Ordered in ED Medications  sodium chloride flush (NS) 0.9 % injection 3 mL (has no administration in time range)     Initial Impression / Assessment and Plan / ED Course  I have reviewed the triage vital signs and the nursing notes.  Pertinent labs & imaging results that were available during my care of the patient were reviewed by me and considered in my medical decision making (see chart for details).        Patient's evaluation here including head CT as well as neck CT without acute findings.  Does have evidence of UTI will treat with antibiotics.  Will admit for evaluation of syncope  Final Clinical Impressions(s) / ED Diagnoses   Final diagnoses:  None    ED  Discharge Orders    None       Lacretia Leigh, MD 04/06/19 2313

## 2019-04-06 NOTE — ED Notes (Signed)
Lab to collect urine culture from urine sample sent earlier.

## 2019-04-06 NOTE — ED Notes (Signed)
Pt had an unwitnessed fall in the lobby. This nurse saw pt on lobby floor on her Right leg, pt was assisted back to her wheelchair by bystanders. When patient asked what happened she stated that she was getting up from the wheelchair and her foot "got caught on the foot rest and she fell" pt denies any injury, denies hitting her head, denies pain and any integumentary complaints, states that she does not need further evaluation.

## 2019-04-06 NOTE — ED Notes (Signed)
Pt ambulated to bathroom with no problems. ?

## 2019-04-07 ENCOUNTER — Observation Stay (HOSPITAL_BASED_OUTPATIENT_CLINIC_OR_DEPARTMENT_OTHER): Payer: Medicare Other

## 2019-04-07 DIAGNOSIS — I361 Nonrheumatic tricuspid (valve) insufficiency: Secondary | ICD-10-CM | POA: Diagnosis not present

## 2019-04-07 DIAGNOSIS — R55 Syncope and collapse: Secondary | ICD-10-CM | POA: Diagnosis not present

## 2019-04-07 DIAGNOSIS — I371 Nonrheumatic pulmonary valve insufficiency: Secondary | ICD-10-CM | POA: Diagnosis not present

## 2019-04-07 LAB — GLUCOSE, CAPILLARY
Glucose-Capillary: 164 mg/dL — ABNORMAL HIGH (ref 70–99)
Glucose-Capillary: 142 mg/dL — ABNORMAL HIGH (ref 70–99)

## 2019-04-07 LAB — BASIC METABOLIC PANEL
Anion gap: 11 (ref 5–15)
BUN: 14 mg/dL (ref 8–23)
CO2: 28 mmol/L (ref 22–32)
Calcium: 9.1 mg/dL (ref 8.9–10.3)
Chloride: 98 mmol/L (ref 98–111)
Creatinine, Ser: 1.11 mg/dL — ABNORMAL HIGH (ref 0.44–1.00)
GFR calc Af Amer: 60 mL/min — ABNORMAL LOW (ref >60)
GFR calc non Af Amer: 52 mL/min — ABNORMAL LOW (ref >60)
Glucose, Bld: 201 mg/dL — ABNORMAL HIGH (ref 70–99)
Potassium: 3.6 mmol/L (ref 3.5–5.1)
Sodium: 137 mmol/L (ref 135–145)

## 2019-04-07 LAB — SARS CORONAVIRUS 2 (TAT 6-24 HRS): SARS Coronavirus 2: NEGATIVE

## 2019-04-07 LAB — CBC
HCT: 40.6 % (ref 36.0–46.0)
Hemoglobin: 13.2 g/dL (ref 12.0–15.0)
MCH: 29.7 pg (ref 26.0–34.0)
MCHC: 32.5 g/dL (ref 30.0–36.0)
MCV: 91.2 fL (ref 80.0–100.0)
Platelets: 203 10*3/uL (ref 150–400)
RBC: 4.45 MIL/uL (ref 3.87–5.11)
RDW: 12 % (ref 11.5–15.5)
WBC: 5.4 10*3/uL (ref 4.0–10.5)
nRBC: 0 % (ref 0.0–0.2)

## 2019-04-07 LAB — ECHOCARDIOGRAM COMPLETE
Height: 61.4 in
Weight: 2224 oz

## 2019-04-07 LAB — TROPONIN I (HIGH SENSITIVITY): Troponin I (High Sensitivity): 6 ng/L (ref <18)

## 2019-04-07 LAB — CBG MONITORING, ED: Glucose-Capillary: 136 mg/dL — ABNORMAL HIGH (ref 70–99)

## 2019-04-07 MED ORDER — POLYETHYLENE GLYCOL 3350 17 G PO PACK: 17.0000 g | PACK | Freq: Every day | ORAL | Status: DC | PRN

## 2019-04-07 MED ORDER — SULFAMETHOXAZOLE-TRIMETHOPRIM 800-160 MG PO TABS: 1.0000 | ORAL_TABLET | Freq: Two times a day (BID) | ORAL | 0 refills | Status: AC

## 2019-04-07 MED ORDER — ENSURE ENLIVE PO LIQD
237.0000 mL | Freq: Two times a day (BID) | ORAL | Status: DC
  Administered 2019-04-07: 237 mL via ORAL

## 2019-04-07 MED ORDER — MIRABEGRON ER 50 MG PO TB24
50.0000 mg | ORAL_TABLET | Freq: Every day | ORAL | Status: DC
  Administered 2019-04-07: 50 mg via ORAL
  Filled 2019-04-07: qty 1

## 2019-04-07 MED ORDER — ACETAMINOPHEN 325 MG PO TABS: 650.0000 mg | ORAL_TABLET | Freq: Four times a day (QID) | ORAL | Status: DC | PRN

## 2019-04-07 MED ORDER — SODIUM CHLORIDE 0.9% FLUSH
3.0000 mL | Freq: Two times a day (BID) | INTRAVENOUS | Status: DC
  Administered 2019-04-07: 3 mL via INTRAVENOUS

## 2019-04-07 MED ORDER — AMLODIPINE BESYLATE 10 MG PO TABS: 10.0000 mg | ORAL_TABLET | Freq: Every day | ORAL | Status: DC

## 2019-04-07 MED ORDER — NITROFURANTOIN MACROCRYSTAL 50 MG PO CAPS
100.0000 mg | ORAL_CAPSULE | Freq: Every day | ORAL | Status: DC
  Administered 2019-04-07: 100 mg via ORAL
  Filled 2019-04-07: qty 1
  Filled 2019-04-07: qty 2

## 2019-04-07 MED ORDER — NEBIVOLOL HCL 10 MG PO TABS
20.0000 mg | ORAL_TABLET | Freq: Every day | ORAL | Status: DC
  Administered 2019-04-07: 20 mg via ORAL
  Filled 2019-04-07: qty 2

## 2019-04-07 MED ORDER — PRAVASTATIN SODIUM 40 MG PO TABS: 40.0000 mg | ORAL_TABLET | Freq: Every day | ORAL | Status: DC

## 2019-04-07 MED ORDER — SODIUM CHLORIDE 0.9 % IV SOLN: 250.0000 mL | INTRAVENOUS | Status: DC | PRN

## 2019-04-07 MED ORDER — ENOXAPARIN SODIUM 40 MG/0.4ML ~~LOC~~ SOLN
40.0000 mg | SUBCUTANEOUS | Status: DC
  Administered 2019-04-07: 40 mg via SUBCUTANEOUS
  Filled 2019-04-07: qty 0.4

## 2019-04-07 MED ORDER — ACETAMINOPHEN 650 MG RE SUPP: 650.0000 mg | Freq: Four times a day (QID) | RECTAL | Status: DC | PRN

## 2019-04-07 MED ORDER — SODIUM CHLORIDE 0.9% FLUSH: 3.0000 mL | INTRAVENOUS | Status: DC | PRN

## 2019-04-07 MED ORDER — SPIRONOLACTONE 25 MG PO TABS
50.0000 mg | ORAL_TABLET | Freq: Every day | ORAL | Status: DC
  Administered 2019-04-07: 50 mg via ORAL
  Filled 2019-04-07: qty 2

## 2019-04-07 MED ORDER — LOSARTAN POTASSIUM 50 MG PO TABS
50.0000 mg | ORAL_TABLET | Freq: Every day | ORAL | Status: DC
  Administered 2019-04-07: 50 mg via ORAL
  Filled 2019-04-07: qty 1

## 2019-04-07 MED ORDER — ALBUTEROL SULFATE (2.5 MG/3ML) 0.083% IN NEBU: 2.5000 mg | INHALATION_SOLUTION | RESPIRATORY_TRACT | Status: DC | PRN

## 2019-04-07 MED ORDER — PANTOPRAZOLE SODIUM 40 MG PO TBEC
40.0000 mg | DELAYED_RELEASE_TABLET | Freq: Every day | ORAL | Status: DC
  Administered 2019-04-07: 40 mg via ORAL
  Filled 2019-04-07: qty 1

## 2019-04-07 MED ORDER — GABAPENTIN 300 MG PO CAPS
300.0000 mg | ORAL_CAPSULE | Freq: Three times a day (TID) | ORAL | Status: DC
  Administered 2019-04-07: 300 mg via ORAL
  Filled 2019-04-07: qty 1

## 2019-04-07 NOTE — Progress Notes (Signed)
  Chronic Care Management   Outreach Note  04/06/2019 Name: [name] MRN: [id number] DOB: 1951/07/28  Referred by: [name], MD Reason for referral : Chronic Care Management   An unsuccessful telephone outreach was attempted today. The patient was referred to the case management team by for assistance with chronic care management and care coordination.  Patient currently admitted to the hospital for syncopal event.  Follow Up Plan: The care management team will reach out to the patient again over the next 5-7 business days.    Regina Eck, PharmD, BCPS Clinical Pharmacist, [facility] [place]: [phone]

## 2019-04-07 NOTE — Discharge Summary (Signed)
Physician Discharge Summary  APRYLL HINKLE CBJ:[id number] DOB: 20-Apr-1951 DOA: 04/06/2019  PCP: [name], MD  Admit date: 04/06/2019 Discharge date: 04/07/2019  Admitted From: Home Disposition: Home  Recommendations for Outpatient Follow-up:  1. Follow up with PCP in 1-2 weeks 2. Please obtain BMP/CBC in one week 3. Please follow up on the following pending results:  Home Health: None Equipment/Devices: None  Discharge Condition: Stable CODE STATUS: Full code Diet recommendation: Cardiac  Subjective: Patient seen and examined this morning.  She had no complaint.  No more dizziness, headache or nausea.  No palpitation.  Brief/Interim Summary: [name] is a 68 y.o. female with medical history significant for hypertension, asthma, insulin-dependent diabetes mellitus, recurrent UTI, and arthritis presented to the emergency department for evaluation of syncope.  Patient reports that she been in her usual state of health, was having an uneventful day, and had just stepped out of the shower when she suffered a loss of consciousness.  Patient denied any preceding lightheadedness, nausea, chest pain, or palpitations, but her roommate heard her fall, and the patient then remembers waking up on the floor of her bathroom.  She had some pain in the back of her head after this episode, but otherwise returned to her usual state fairly quickly.  The roommate did not observe any seizure-like activity.  Patient reports that this event is similar to what she experienced a little over a year ago.  She denies any recent fevers, chills, shortness of breath, or cough.  She reports that she continues to have some mild dysuria, though that has been improving with an antibiotic.  Upon arrival to the ED, patient was afebrile, saturating well on room air, and hypertensive 190/100.  EKG features a sinus rhythm.  Noncontrast head CT was negative for acute intracranial abnormality and there is no  acute findings on cervical spine CT.  Chemistry panel is notable for creatinine 1.30, similar to priors.  CBC is unremarkable and initial high-sensitivity troponin is normal.  Urinalysis suggestive of possible infection.  Patient was treated with Rocephin in the emergency department and subsequently admitted under hospitalist service for further evaluation.  Patient was observed overnight on telemetry with no acute events.  She underwent transthoracic echo which ruled out any valvular heart disease which could cause syncope.  Patient has no more symptoms and feeling much better without having any headache, dizziness and her blood pressure has also improved and is back to normal now so she is going to be discharged back to home.  Her syncope could be either vasovagal or due to hypertensive urgency.  She will follow with PCP.  No changes in medication.  Discharge Diagnoses:  Principal Problem:   Syncope Active Problems:   HYPERTENSION, BENIGN ESSENTIAL   Asthma   Type II diabetes mellitus, uncontrolled (HCC)   CKD (chronic kidney disease), stage III ([place])   Hypertensive urgency   Recurrent UTI    Discharge Instructions  Discharge Instructions    Discharge patient   Complete by: As directed    Discharge disposition: 01-Home or Self Care   Discharge patient date: 04/07/2019     Allergies as of 04/07/2019      Reactions   Pollen Extract Other (See Comments)   Congestion/sneezing   Tomato Hives, Itching   Codeine Hives, Itching, Nausea And Vomiting      Medication List    TAKE these medications   albuterol 108 (90 Base) MCG/ACT inhaler Commonly known as: ProAir HFA 2 puffs every 4  hours as needed only  if your can't catch your breath What changed:   how much to take  how to take this  when to take this  reasons to take this  additional instructions   amLODipine 10 MG tablet Commonly known as: NORVASC TAKE 1 TABLET(10 MG) BY MOUTH EVERY EVENING What changed: See the new  instructions.   docusate sodium 100 MG capsule Commonly known as: COLACE Take 100 mg by mouth 2 (two) times daily as needed for mild constipation.   FreeStyle Libre 14 Day Sensor Misc by Does not apply route. CGM 14-day sensors; send refills to [facility] (in [place])   [employer] by Does not apply route. CGM device-Freestyle Libre   gabapentin 300 MG capsule Commonly known as: NEURONTIN Take 1 capsule (300 mg total) by mouth 3 (three) times daily.   glucose blood test strip Check blood sugar before meals and before bedtime   insulin lispro 100 UNIT/ML injection Commonly known as: HUMALOG Inject 25 units in AM, 35 units at lunchtime, and 25 units at evening meal What changed:   how much to take  how to take this  when to take this  additional instructions   Lancets Misc. Misc 1 each by Does not apply route 4 (four) times daily -  with meals and at bedtime.   losartan 50 MG tablet Commonly known as: COZAAR Take 1 tablet (50 mg total) by mouth daily.   multivitamin with minerals Tabs tablet Take 1 tablet by mouth daily.   Myrbetriq 50 MG Tb24 tablet Generic drug: mirabegron ER Take 50 mg by mouth daily.   naproxen sodium 220 MG tablet Commonly known as: ALEVE Take 220 mg by mouth 2 (two) times daily as needed (pain).   Nebivolol HCl 20 MG Tabs Commonly known as: Bystolic Take 1 tablet (20 mg total) by mouth daily.   nitrofurantoin 100 MG capsule Commonly known as: MACRODANTIN Take 100 mg by mouth daily.   Ozempic (1 MG/DOSE) 2 MG/1.5ML Sopn Generic drug: Semaglutide (1 MG/DOSE) INJECT 1 MG INTO THE SKIN ONCE A WEEK What changed: See the new instructions.   pantoprazole 40 MG tablet Commonly known as: Protonix Take 30- 60 min before your first and last meals of the day What changed:   how much to take  how to take this  when to take this  additional instructions   pravastatin 80 MG tablet Commonly known as: PRAVACHOL TAKE  1 TABLET BY MOUTH EVERY DAY What changed: how much to take   spironolactone 50 MG tablet Commonly known as: ALDACTONE TAKE 1 TABLET(50 MG) BY MOUTH DAILY What changed: See the new instructions.   sulfamethoxazole-trimethoprim 800-160 MG tablet Commonly known as: BACTRIM DS Take 1 tablet by mouth 2 (two) times daily for 5 days.   traMADol 50 MG tablet Commonly known as: ULTRAM Take 1-2 tablets (50-100 mg total) by mouth every 6 (six) hours as needed (pain).   Vitamin D (Ergocalciferol) 1.25 MG (50000 UT) Caps capsule Commonly known as: DRISDOL TAKE 1 CAPSULE BY MOUTH EVERY 7 DAYS ON WEDNESDAY What changed: See the new instructions.      Follow-up Information    [name], MD Follow up in 1 week(s).   Specialty: Internal Medicine Contact information: [address] [place] [address] [phone]          Allergies  Allergen Reactions  . Pollen Extract Other (See Comments)    Congestion/sneezing  . Tomato Hives and Itching  .  Codeine Hives, Itching and Nausea And Vomiting    Consultations: None   Procedures/Studies: Ct Head Wo Contrast  Result Date: 04/06/2019 CLINICAL DATA:  Headache, post traumaticC-spine trauma, ligamentous injury suspectedPt stated she had a fall today but don't remember where she fell except that she was at home. EXAM: CT HEAD WITHOUT CONTRAST CT CERVICAL SPINE WITHOUT CONTRAST TECHNIQUE: Multidetector CT imaging of the head and cervical spine was performed following the standard protocol without intravenous contrast. Multiplanar CT image reconstructions of the cervical spine were also generated. COMPARISON:  10/01/2018 FINDINGS: CT HEAD FINDINGS Brain: No evidence of acute infarction, hemorrhage, hydrocephalus, extra-axial collection or mass lesion/mass effect. Patchy white matter hypoattenuation is noted consistent with moderate chronic microvascular ischemic change. Vascular: No hyperdense vessel or unexpected calcification.  Skull: Normal. Negative for fracture or focal lesion. Sinuses/Orbits: Globes and orbits are unremarkable. The visualized sinuses and mastoid air cells are clear. Other: Small left forehead scalp hematoma. CT CERVICAL SPINE FINDINGS Alignment: Mild kyphosis, apex at C5-C6. No spondylolisthesis/subluxation. Skull base and vertebrae: No acute fracture. No primary bone lesion or focal pathologic process. Soft tissues and spinal canal: No prevertebral fluid or swelling. No visible canal hematoma. Disc levels: Moderate loss of disc height at C5-C6 and C6-C7. Endplate spurring and spondylotic disc bulging is noted most prominently at C5-C6. Mild facet degenerative change on the right at C4-C5. No convincing disc herniation. Upper chest: No acute findings.  Clear lung apices. Other: None. IMPRESSION: HEAD CT 1. No acute intracranial abnormalities. 2. Moderate chronic microvascular ischemic change, advanced for age, stable from prior head CT. 3. Small left frontal scalp contusion. CERVICAL CT 1. No fracture or acute finding. Electronically Signed   By: Lajean Manes M.D.   On: 04/06/2019 20:46   Ct Cervical Spine Wo Contrast  Result Date: 04/06/2019 CLINICAL DATA:  Headache, post traumaticC-spine trauma, ligamentous injury suspectedPt stated she had a fall today but don't remember where she fell except that she was at home. EXAM: CT HEAD WITHOUT CONTRAST CT CERVICAL SPINE WITHOUT CONTRAST TECHNIQUE: Multidetector CT imaging of the head and cervical spine was performed following the standard protocol without intravenous contrast. Multiplanar CT image reconstructions of the cervical spine were also generated. COMPARISON:  10/01/2018 FINDINGS: CT HEAD FINDINGS Brain: No evidence of acute infarction, hemorrhage, hydrocephalus, extra-axial collection or mass lesion/mass effect. Patchy white matter hypoattenuation is noted consistent with moderate chronic microvascular ischemic change. Vascular: No hyperdense vessel or  unexpected calcification. Skull: Normal. Negative for fracture or focal lesion. Sinuses/Orbits: Globes and orbits are unremarkable. The visualized sinuses and mastoid air cells are clear. Other: Small left forehead scalp hematoma. CT CERVICAL SPINE FINDINGS Alignment: Mild kyphosis, apex at C5-C6. No spondylolisthesis/subluxation. Skull base and vertebrae: No acute fracture. No primary bone lesion or focal pathologic process. Soft tissues and spinal canal: No prevertebral fluid or swelling. No visible canal hematoma. Disc levels: Moderate loss of disc height at C5-C6 and C6-C7. Endplate spurring and spondylotic disc bulging is noted most prominently at C5-C6. Mild facet degenerative change on the right at C4-C5. No convincing disc herniation. Upper chest: No acute findings.  Clear lung apices. Other: None. IMPRESSION: HEAD CT 1. No acute intracranial abnormalities. 2. Moderate chronic microvascular ischemic change, advanced for age, stable from prior head CT. 3. Small left frontal scalp contusion. CERVICAL CT 1. No fracture or acute finding. Electronically Signed   By: Lajean Manes M.D.   On: 04/06/2019 20:46      Discharge Exam: Vitals:   04/07/19 5465  04/07/19 1157  BP: 126/72 135/75  Pulse: 78 81  Resp: 18 18  Temp: 98 F (36.7 C) 97.8 F (36.6 C)  SpO2: 100% 99%   Vitals:   04/07/19 0351 04/07/19 0549 04/07/19 0812 04/07/19 1157  BP:  (!) 152/82 126/72 135/75  Pulse:  87 78 81  Resp:  18 18 18   Temp:  97.9 F (36.6 C) 98 F (36.7 C) 97.8 F (36.6 C)  TempSrc:  Oral Oral Oral  SpO2:  100% 100% 99%  Weight: 63 kg     Height:        General: Pt is alert, awake, not in acute distress Cardiovascular: RRR, S1/S2 +, no rubs, no gallops Respiratory: CTA bilaterally, no wheezing, no rhonchi Abdominal: Soft, NT, ND, bowel sounds + Extremities: no edema, no cyanosis    The results of significant diagnostics from this hospitalization (including imaging, microbiology, ancillary and  laboratory) are listed below for reference.     Microbiology: Recent Results (from the past 240 hour(s))  SARS CORONAVIRUS 2 Nasal Swab Aptima Multi Swab     Status: None   Collection Time: 04/06/19 11:13 PM   Specimen: Aptima Multi Swab; Nasal Swab  Result Value Ref Range Status   SARS Coronavirus 2 NEGATIVE NEGATIVE Final    Comment: (NOTE) SARS-CoV-2 target nucleic acids are NOT DETECTED. The SARS-CoV-2 RNA is generally detectable in upper and lower respiratory specimens during the acute phase of infection. Negative results do not preclude SARS-CoV-2 infection, do not rule out co-infections with other pathogens, and should not be used as the sole basis for treatment or other patient management decisions. Negative results must be combined with clinical observations, patient history, and epidemiological information. The expected result is Negative. Fact Sheet for Patients: SugarRoll.be Fact Sheet for Healthcare Providers: https://www.woods-mathews.com/ This test is not yet approved or cleared by the [place] FDA and  has been authorized for detection and/or diagnosis of SARS-CoV-2 by FDA under an Emergency Use Authorization (EUA). This EUA will remain  in effect (meaning this test can be used) for the duration of the COVID-19 declaration under Section 56 4(b)(1) of the Act, 21 U.S.C. section 360bbb-3(b)(1), unless the authorization is terminated or revoked sooner. Performed at [facility], [place] [address] [place], [place] [address]      Labs: BNP (last 3 results) No results for input(s): BNP in the last 8760 hours. Basic Metabolic Panel: Recent Labs  Lab 04/06/19 1323 04/07/19 0530  NA 136 137  K 4.2 3.6  CL 100 98  CO2 26 28  GLUCOSE 242* 201*  BUN 13 14  CREATININE 1.30* 1.11*  CALCIUM 9.3 9.1   Liver Function Tests: No results for input(s): AST, ALT, ALKPHOS, BILITOT, PROT, ALBUMIN in the last 168  hours. No results for input(s): LIPASE, AMYLASE in the last 168 hours. No results for input(s): AMMONIA in the last 168 hours. CBC: Recent Labs  Lab 04/06/19 1323 04/07/19 0530  WBC 4.1 5.4  HGB 14.1 13.2  HCT 43.7 40.6  MCV 92.2 91.2  PLT 225 203   Cardiac Enzymes: No results for input(s): CKTOTAL, CKMB, CKMBINDEX, TROPONINI in the last 168 hours. BNP: Invalid input(s): POCBNP CBG: Recent Labs  Lab 04/06/19 1958 04/07/19 0000 04/07/19 0810 04/07/19 1155  GLUCAP 130* 136* 164* 142*   D-Dimer No results for input(s): DDIMER in the last 72 hours. Hgb A1c No results for input(s): HGBA1C in the last 72 hours. Lipid Profile No results for input(s): CHOL, HDL, LDLCALC,  TRIG, CHOLHDL, LDLDIRECT in the last 72 hours. Thyroid function studies No results for input(s): TSH, T4TOTAL, T3FREE, THYROIDAB in the last 72 hours.  Invalid input(s): FREET3 Anemia work up No results for input(s): VITAMINB12, FOLATE, FERRITIN, TIBC, IRON, RETICCTPCT in the last 72 hours. Urinalysis    Component Value Date/Time   COLORURINE YELLOW 04/06/2019 2249   APPEARANCEUR CLOUDY (A) 04/06/2019 2249   LABSPEC 1.018 04/06/2019 2249   PHURINE 5.0 04/06/2019 2249   GLUCOSEU 50 (A) 04/06/2019 2249   GLUCOSEU >=1000 03/16/2013 1259   HGBUR SMALL (A) 04/06/2019 2249   HGBUR trace-intact 11/17/2010 1230   BILIRUBINUR NEGATIVE 04/06/2019 2249   BILIRUBINUR neg 12/04/2013 1652   KETONESUR 5 (A) 04/06/2019 2249   PROTEINUR 30 (A) 04/06/2019 2249   UROBILINOGEN negative 12/04/2013 1652   UROBILINOGEN 0.2 11/15/2013 0040   NITRITE POSITIVE (A) 04/06/2019 2249   LEUKOCYTESUR LARGE (A) 04/06/2019 2249   Sepsis Labs Invalid input(s): PROCALCITONIN,  WBC,  LACTICIDVEN Microbiology Recent Results (from the past 240 hour(s))  SARS CORONAVIRUS 2 Nasal Swab Aptima Multi Swab     Status: None   Collection Time: 04/06/19 11:13 PM   Specimen: Aptima Multi Swab; Nasal Swab  Result Value Ref Range Status    SARS Coronavirus 2 NEGATIVE NEGATIVE Final    Comment: (NOTE) SARS-CoV-2 target nucleic acids are NOT DETECTED. The SARS-CoV-2 RNA is generally detectable in upper and lower respiratory specimens during the acute phase of infection. Negative results do not preclude SARS-CoV-2 infection, do not rule out co-infections with other pathogens, and should not be used as the sole basis for treatment or other patient management decisions. Negative results must be combined with clinical observations, patient history, and epidemiological information. The expected result is Negative. Fact Sheet for Patients: SugarRoll.be Fact Sheet for Healthcare Providers: https://www.woods-mathews.com/ This test is not yet approved or cleared by the [place] FDA and  has been authorized for detection and/or diagnosis of SARS-CoV-2 by FDA under an Emergency Use Authorization (EUA). This EUA will remain  in effect (meaning this test can be used) for the duration of the COVID-19 declaration under Section 56 4(b)(1) of the Act, 21 U.S.C. section 360bbb-3(b)(1), unless the authorization is terminated or revoked sooner. Performed at [facility], [place] [address] [place], [place] [address]      Time coordinating discharge:  30 minutes  SIGNED:   Darliss Cheney, MD  [facility] 04/07/2019, 2:02 PM Pager [phone]  If 7PM-7AM, please contact night-coverage [email or web address] Password [email or web address]

## 2019-04-07 NOTE — ED Notes (Signed)
ED TO INPATIENT HANDOFF REPORT  ED Nurse Name and Phone #:  [phone]  S Name/Age/Gender [name] 68 y.o. female Room/Bed: 017C/017C  Code Status   Code Status: Prior  Home/SNF/Other Home Patient oriented to: self, place, time and situation Is this baseline? Yes   Triage Complete: Triage complete  Chief Complaint fall/c/o ha  Triage Note No notes on file   Allergies Allergies  Allergen Reactions  . Pollen Extract Other (See Comments)    Congestion/sneezing  . Tomato Hives and Itching  . Codeine Hives, Itching and Nausea And Vomiting    Level of Care/Admitting Diagnosis ED Disposition    ED Disposition Condition [facility] Area: [place] [[id number]]  Level of Care: Telemetry Medical [104]  I expect the patient will be discharged within 24 hours: Yes  LOW acuity---Tx typically complete <24 hrs---ACUTE conditions typically can be evaluated <24 hours---LABS likely to return to acceptable levels <24 hours---IS near functional baseline---EXPECTED to return to current living arrangement---NOT newly hypoxic: Meets criteria for 5C-Observation unit  Covid Evaluation: Asymptomatic Screening Protocol (No Symptoms)  Diagnosis: Syncope [206001]  Admitting Physician: Vianne Bulls [[id number]]  Attending Physician: Vianne Bulls [[id number]]  PT Class (Do Not Modify): Observation [104]  PT Acc Code (Do Not Modify): Observation [10022]       B Medical/Surgery History Past Medical History:  Diagnosis Date  . Arthritis    L knee, hands, back   . Asthma   . Diabetes mellitus type 2, insulin dependent ([place])   . Fibromyalgia   . Full dentures   . GERD (gastroesophageal reflux disease)   . H/O hiatal hernia   . Headache    h/o migraines - was followed for a time with a wellness doctor  . History of esophageal dilatation   . History of rhabdomyolysis    03/ 2015  . Hyperlipidemia   . Hypertension   . Insulin pump in place     since 12/ 2014--  [employer]  . Osteoarthritis of left knee, primary localized 08/17/2014  . Renal insufficiency   . SUI (stress urinary incontinence, female)   . Wears glasses    Past Surgical History:  Procedure Laterality Date  . BLADDER SUSPENSION N/A 03/09/2014   Procedure: CYSTOSCOPY/SLING;  Surgeon: Reece Packer, MD;  Location: [facility];  Service: Urology;  Laterality: N/A;  . CARDIAC CATHETERIZATION  07-20-2009   DR Shelva Majestic   MODERATE LVH/  NORMAL  LVEF/  NORMAL CORONARY AND RENAL ARTERIES  . CARPAL TUNNEL RELEASE Right 2000  . CHOLECYSTECTOMY  1990  . COLONOSCOPY WITH ESOPHAGOGASTRODUODENOSCOPY (EGD)  06-18-2002  . ESOPHAGOGASTRODUODENOSCOPY (EGD) WITH ESOPHAGEAL DILATION  06-12-2010  . EXCISION LEFT UPPER ARM MASS  01-29-2014  . EYE SURGERY  2010   laser on R eye  . INTERSTIM IMPLANT REMOVAL N/A 03/06/2019   Procedure: REMOVAL OF INTERSTIM IMPLANT;  Surgeon: Bjorn Loser, MD;  Location: [facility];  Service: Urology;  Laterality: N/A;  . KNEE ARTHROSCOPY Right 1989  &  2002  . NEGATIVE SLEEP STUDY  2012   PER PT  . PARTIAL KNEE ARTHROPLASTY Left 08/17/2014   Procedure: LEFT UNICOMPARTMENTAL KNEE;  Surgeon: Johnny Bridge, MD;  Location: [place];  Service: Orthopedics;  Laterality: Left;     A IV Location/Drains/Wounds Patient Lines/Drains/Airways Status   Active Line/Drains/Airways    Name:   Placement date:   Placement time:   Site:   Days:   Peripheral IV  04/06/19 Left Antecubital   04/06/19    2131    Antecubital   1   Incision (Closed) 03/09/14 Perineum Other (Comment)   03/09/14    1122     1855   Incision (Closed) 08/17/14 Knee Left   08/17/14    0840     1694   Incision (Closed) 03/06/19 Back Other (Comment)   03/06/19    1735     32   Incision - 2 Ports Other (Comment) Right;Lower Left;Lower   03/09/14    1140     1855          Intake/Output Last 24 hours  Intake/Output Summary (Last 24 hours) at 04/07/2019 0129 Last data filed  at 04/07/2019 0107 Gross per 24 hour  Intake 100 ml  Output -  Net 100 ml    Labs/Imaging Results for orders placed or performed during the hospital encounter of 04/06/19 (from the past 48 hour(s))  Basic metabolic panel     Status: Abnormal   Collection Time: 04/06/19  1:23 PM  Result Value Ref Range   Sodium 136 135 - 145 mmol/L   Potassium 4.2 3.5 - 5.1 mmol/L   Chloride 100 98 - 111 mmol/L   CO2 26 22 - 32 mmol/L   Glucose, Bld 242 (H) 70 - 99 mg/dL   BUN 13 8 - 23 mg/dL   Creatinine, Ser 1.30 (H) 0.44 - 1.00 mg/dL   Calcium 9.3 8.9 - 10.3 mg/dL   GFR calc non Af Amer 43 (L) >60 mL/min   GFR calc Af Amer 50 (L) >60 mL/min   Anion gap 10 5 - 15    Comment: Performed at [facility], [place] [address] [place], [place] [address]  CBC     Status: None   Collection Time: 04/06/19  1:23 PM  Result Value Ref Range   WBC 4.1 4.0 - 10.5 K/uL   RBC 4.74 3.87 - 5.11 MIL/uL   Hemoglobin 14.1 12.0 - 15.0 g/dL   HCT 43.7 36.0 - 46.0 %   MCV 92.2 80.0 - 100.0 fL   MCH 29.7 26.0 - 34.0 pg   MCHC 32.3 30.0 - 36.0 g/dL   RDW 11.8 11.5 - 15.5 %   Platelets 225 150 - 400 K/uL   nRBC 0.0 0.0 - 0.2 %    Comment: Performed at [facility], [place] [address], [place], [place] [address]  CBG monitoring, ED     Status: Abnormal   Collection Time: 04/06/19  7:58 PM  Result Value Ref Range   Glucose-Capillary 130 (H) 70 - 99 mg/dL  Troponin I (High Sensitivity)     Status: None   Collection Time: 04/06/19  9:30 PM  Result Value Ref Range   Troponin I (High Sensitivity) 5 <18 ng/L    Comment: (NOTE) Elevated high sensitivity troponin I (hsTnI) values and significant  changes across serial measurements may suggest ACS but many other  chronic and acute conditions are known to elevate hsTnI results.  Refer to the "Links" section for chest pain algorithms and additional  guidance. Performed at [facility], [place] [address] [place], [place] [address]   Urinalysis, Routine w  reflex microscopic     Status: Abnormal   Collection Time: 04/06/19 10:49 PM  Result Value Ref Range   Color, Urine YELLOW YELLOW   APPearance CLOUDY (A) CLEAR   Specific Gravity, Urine 1.018 1.005 - 1.030   pH 5.0 5.0 - 8.0  Glucose, UA 50 (A) NEGATIVE mg/dL   Hgb urine dipstick SMALL (A) NEGATIVE   Bilirubin Urine NEGATIVE NEGATIVE   Ketones, ur 5 (A) NEGATIVE mg/dL   Protein, ur 30 (A) NEGATIVE mg/dL   Nitrite POSITIVE (A) NEGATIVE   Leukocytes,Ua LARGE (A) NEGATIVE   RBC / HPF 0-5 0 - 5 RBC/hpf   WBC, UA 6-10 0 - 5 WBC/hpf   Bacteria, UA MANY (A) NONE SEEN   Squamous Epithelial / LPF 6-10 0 - 5   Mucus PRESENT     Comment: Performed at [facility], [place] [address], [place], [place] [address]  Troponin I (High Sensitivity)     Status: None   Collection Time: 04/06/19 11:28 PM  Result Value Ref Range   Troponin I (High Sensitivity) 6 <18 ng/L    Comment: (NOTE) Elevated high sensitivity troponin I (hsTnI) values and significant  changes across serial measurements may suggest ACS but many other  chronic and acute conditions are known to elevate hsTnI results.  Refer to the "Links" section for chest pain algorithms and additional  guidance. Performed at [facility], [place] [address] [place], [place] [address]   CBG monitoring, ED     Status: Abnormal   Collection Time: 04/07/19 12:00 AM  Result Value Ref Range   Glucose-Capillary 136 (H) 70 - 99 mg/dL   Ct Head Wo Contrast  Result Date: 04/06/2019 CLINICAL DATA:  Headache, post traumaticC-spine trauma, ligamentous injury suspectedPt stated she had a fall today but don't remember where she fell except that she was at home. EXAM: CT HEAD WITHOUT CONTRAST CT CERVICAL SPINE WITHOUT CONTRAST TECHNIQUE: Multidetector CT imaging of the head and cervical spine was performed following the standard protocol without intravenous contrast. Multiplanar CT image reconstructions of the cervical spine were also generated.  COMPARISON:  10/01/2018 FINDINGS: CT HEAD FINDINGS Brain: No evidence of acute infarction, hemorrhage, hydrocephalus, extra-axial collection or mass lesion/mass effect. Patchy white matter hypoattenuation is noted consistent with moderate chronic microvascular ischemic change. Vascular: No hyperdense vessel or unexpected calcification. Skull: Normal. Negative for fracture or focal lesion. Sinuses/Orbits: Globes and orbits are unremarkable. The visualized sinuses and mastoid air cells are clear. Other: Small left forehead scalp hematoma. CT CERVICAL SPINE FINDINGS Alignment: Mild kyphosis, apex at C5-C6. No spondylolisthesis/subluxation. Skull base and vertebrae: No acute fracture. No primary bone lesion or focal pathologic process. Soft tissues and spinal canal: No prevertebral fluid or swelling. No visible canal hematoma. Disc levels: Moderate loss of disc height at C5-C6 and C6-C7. Endplate spurring and spondylotic disc bulging is noted most prominently at C5-C6. Mild facet degenerative change on the right at C4-C5. No convincing disc herniation. Upper chest: No acute findings.  Clear lung apices. Other: None. IMPRESSION: HEAD CT 1. No acute intracranial abnormalities. 2. Moderate chronic microvascular ischemic change, advanced for age, stable from prior head CT. 3. Small left frontal scalp contusion. CERVICAL CT 1. No fracture or acute finding. Electronically Signed   By: Lajean Manes M.D.   On: 04/06/2019 20:46   Ct Cervical Spine Wo Contrast  Result Date: 04/06/2019 CLINICAL DATA:  Headache, post traumaticC-spine trauma, ligamentous injury suspectedPt stated she had a fall today but don't remember where she fell except that she was at home. EXAM: CT HEAD WITHOUT CONTRAST CT CERVICAL SPINE WITHOUT CONTRAST TECHNIQUE: Multidetector CT imaging of the head and cervical spine was performed following the standard protocol without intravenous contrast. Multiplanar CT image reconstructions of the cervical spine  were also  generated. COMPARISON:  10/01/2018 FINDINGS: CT HEAD FINDINGS Brain: No evidence of acute infarction, hemorrhage, hydrocephalus, extra-axial collection or mass lesion/mass effect. Patchy white matter hypoattenuation is noted consistent with moderate chronic microvascular ischemic change. Vascular: No hyperdense vessel or unexpected calcification. Skull: Normal. Negative for fracture or focal lesion. Sinuses/Orbits: Globes and orbits are unremarkable. The visualized sinuses and mastoid air cells are clear. Other: Small left forehead scalp hematoma. CT CERVICAL SPINE FINDINGS Alignment: Mild kyphosis, apex at C5-C6. No spondylolisthesis/subluxation. Skull base and vertebrae: No acute fracture. No primary bone lesion or focal pathologic process. Soft tissues and spinal canal: No prevertebral fluid or swelling. No visible canal hematoma. Disc levels: Moderate loss of disc height at C5-C6 and C6-C7. Endplate spurring and spondylotic disc bulging is noted most prominently at C5-C6. Mild facet degenerative change on the right at C4-C5. No convincing disc herniation. Upper chest: No acute findings.  Clear lung apices. Other: None. IMPRESSION: HEAD CT 1. No acute intracranial abnormalities. 2. Moderate chronic microvascular ischemic change, advanced for age, stable from prior head CT. 3. Small left frontal scalp contusion. CERVICAL CT 1. No fracture or acute finding. Electronically Signed   By: Lajean Manes M.D.   On: 04/06/2019 20:46    Pending Labs Unresulted Labs (From admission, onward)    Start     Ordered   04/06/19 2313  Culture, Urine  Once,   STAT     04/06/19 2312   04/06/19 2304  SARS CORONAVIRUS 2 Nasal Swab Aptima Multi Swab  (Asymptomatic/Tier 2 Patients Labs)  Once,   STAT    Question Answer Comment  Is this test for diagnosis or screening Screening   Symptomatic for COVID-19 as defined by CDC No   Hospitalized for COVID-19 No   Admitted to ICU for COVID-19 No   Previously tested for  COVID-19 Yes   Resident in a congregate (group) care setting No   Employed in healthcare setting No   Pregnant No      04/06/19 2303   Signed and Held  Creatinine, serum  (enoxaparin (LOVENOX)    CrCl >/= 30 ml/min)  Weekly,   R    Comments: while on enoxaparin therapy    Signed and Held   Signed and Held  Basic metabolic panel  Tomorrow morning,   R     Signed and Held   Signed and Held  CBC  Tomorrow morning,   R     Signed and Held          Vitals/Pain Today's Vitals   04/06/19 2245 04/06/19 2300 04/07/19 0000 04/07/19 0030  BP: (!) 154/77 (!) 159/119 (!) 147/99 140/75  Pulse: 79 75 77 77  Resp: 15 19 16 19   Temp:      TempSrc:      SpO2: 100% 100% 99% 96%  Weight:      Height:      PainSc:        Isolation Precautions No active isolations  Medications Medications  sodium chloride flush (NS) 0.9 % injection 3 mL (has no administration in time range)  cefTRIAXone (ROCEPHIN) 1 g in sodium chloride 0.9 % 100 mL IVPB (0 g Intravenous Stopped 04/07/19 0107)  labetalol (NORMODYNE) injection 10 mg (has no administration in time range)  insulin aspart (novoLOG) injection 0-9 Units (has no administration in time range)  insulin aspart (novoLOG) injection 0-5 Units (0 Units Subcutaneous Not Given 04/07/19 0034)  insulin detemir (LEVEMIR) injection 20 Units (has no administration in time range)  Mobility walks Low fall risk   Focused Assessments -   R Recommendations: See Admitting Provider Note  Report given to:   Additional Notes: -

## 2019-04-07 NOTE — Progress Notes (Signed)
[name] to be D/C'd Home per MD order.  Discussed with the patient and all questions fully answered.  VSS, Skin clean, dry and intact without evidence of skin break down, no evidence of skin tears noted. IV catheter discontinued intact. Site without signs and symptoms of complications. Dressing and pressure applied.  An After Visit Summary was printed and given to the patient. Patient received prescription.  D/c education completed with patient/family including follow up instructions, medication list, d/c activities limitations if indicated, with other d/c instructions as indicated by MD - patient able to verbalize understanding, all questions fully answered.   Patient instructed to return to ED, call 911, or call MD for any changes in condition.   Patient escorted via [place], and D/C home via private auto.  Luci Bank 04/07/2019 5:42 PM

## 2019-04-07 NOTE — Discharge Instructions (Signed)
Syncope °Syncope is when you pass out (faint) for a short time. It is caused by a sudden decrease in blood flow to the brain. Signs that you may be about to pass out include: °· Feeling dizzy or light-headed. °· Feeling sick to your stomach (nauseous). °· Seeing all white or all black. °· Having cold, clammy skin. °If you pass out, get help right away. Call your local emergency services (911 in the [place] Do not drive yourself to the hospital. °Follow these instructions at home: °Watch for any changes in your symptoms. Take these actions to stay safe and help with your symptoms: °Lifestyle °· Do not drive, use machinery, or play sports until your doctor says it is okay. °· Do not drink alcohol. °· Do not use any products that contain nicotine or tobacco, such as cigarettes and e-cigarettes. If you need help quitting, ask your doctor. °· Drink enough fluid to keep your pee (urine) pale yellow. °General instructions °· Take over-the-counter and prescription medicines only as told by your doctor. °· If you are taking blood pressure or heart medicine, sit up and stand up slowly. Spend a few minutes getting ready to sit and then stand. This can help you feel less dizzy. °· Have someone stay with you until you feel stable. °· If you start to feel like you might pass out, lie down right away and raise (elevate) your feet above the level of your heart. Breathe deeply and steadily. Wait until all of the symptoms are gone. °· Keep all follow-up visits as told by your doctor. This is important. °Get help right away if: °· You have a very bad headache. °· You pass out once or more than once. °· You have pain in your chest, belly, or back. °· You have a very fast or uneven heartbeat (palpitations). °· It hurts to breathe. °· You are bleeding from your mouth or your bottom (rectum). °· You have black or tarry poop (stool). °· You have jerky movements that you cannot control (seizure). °· You are confused. °· You have trouble  walking. °· You are very weak. °· You have vision problems. °These symptoms may be an emergency. Do not wait to see if the symptoms will go away. Get medical help right away. Call your local emergency services (911 in the [place] Do not drive yourself to the hospital. °Summary °· Syncope is when you pass out (faint) for a short time. It is caused by a sudden decrease in blood flow to the brain. °· Signs that you may be about to faint include feeling dizzy, light-headed, or sick to your stomach, seeing all white or all black, or having cold, clammy skin. °· If you start to feel like you might pass out, lie down right away and raise (elevate) your feet above the level of your heart. Breathe deeply and steadily. Wait until all of the symptoms are gone. °This information is not intended to replace advice given to you by your health care provider. Make sure you discuss any questions you have with your health care provider. °Document Released: 01/30/2008 Document Revised: 09/25/2017 Document Reviewed: 09/25/2017 °Elsevier Patient Education © 2020 [employer] ° °

## 2019-04-07 NOTE — ED Notes (Addendum)
Attempted to call report to [id number], Placed on hold for 10 minutes. Will call back.

## 2019-04-08 ENCOUNTER — Telehealth: Payer: Self-pay

## 2019-04-08 LAB — URINE CULTURE: Culture: >=100000 — AB

## 2019-04-08 NOTE — Telephone Encounter (Signed)
Transition Care Management Follow-up Telephone Call  Date of discharge and from where [phone] [facility]  How have you been since you were released from the hospital? Light headed but ok. Head hurts from hitting floor  Any questions or concerns?   no  Items Reviewed:  Did the pt receive and understand the discharge instructions provided? Yes    Medications obtained and verified? Yes   Any new allergies since your discharge? No   Dietary orders reviewed? {no   Do you have support at home? Yes   Other (ie: DME, Home Health, etc) no   Functional Questionnaire: (I = Independent and D = Dependent) ADL's: i  Bathing/Dressing- I    Meal Prep- I   Eating- I   Maintaining continence- I  Transferring/Ambulation- I   Managing Meds- I    Follow up appointments reviewed:    PCP Hospital f/u appt confirmed? Yes  Scheduled to see [name] on [phone]  [facility] f/u appt confirmed?  no  Are transportation arrangements needed? NA  If their condition worsens, is the pt aware to call  their PCP or go to the ED? No   Was the patient provided with contact information for the PCP's office or ED?  yes Was the pt encouraged to call back with questions or concerns? Yes

## 2019-04-08 NOTE — Telephone Encounter (Signed)
Verbal consent given for virtual visit  

## 2019-04-08 NOTE — Telephone Encounter (Signed)
Left message for pt to call back to do tcm call

## 2019-04-13 ENCOUNTER — Encounter: Payer: Self-pay | Admitting: Internal Medicine

## 2019-04-13 ENCOUNTER — Telehealth: Payer: Self-pay

## 2019-04-14 ENCOUNTER — Ambulatory Visit (INDEPENDENT_AMBULATORY_CARE_PROVIDER_SITE_OTHER): Payer: Medicare Other | Admitting: Nurse Practitioner

## 2019-04-14 ENCOUNTER — Encounter: Payer: Self-pay | Admitting: Nurse Practitioner

## 2019-04-14 ENCOUNTER — Other Ambulatory Visit: Payer: Self-pay

## 2019-04-14 ENCOUNTER — Ambulatory Visit (INDEPENDENT_AMBULATORY_CARE_PROVIDER_SITE_OTHER): Payer: Medicare Other | Admitting: Pharmacist

## 2019-04-14 VITALS — BP 150/78 | HR 76 | Temp 97.0°F | Ht 64.0 in | Wt 155.2 lb

## 2019-04-14 DIAGNOSIS — Z09 Encounter for follow-up examination after completed treatment for conditions other than malignant neoplasm: Secondary | ICD-10-CM | POA: Diagnosis not present

## 2019-04-14 DIAGNOSIS — N390 Urinary tract infection, site not specified: Secondary | ICD-10-CM | POA: Diagnosis not present

## 2019-04-14 DIAGNOSIS — R55 Syncope and collapse: Secondary | ICD-10-CM

## 2019-04-14 DIAGNOSIS — R197 Diarrhea, unspecified: Secondary | ICD-10-CM

## 2019-04-14 DIAGNOSIS — I10 Essential (primary) hypertension: Secondary | ICD-10-CM

## 2019-04-14 DIAGNOSIS — E1165 Type 2 diabetes mellitus with hyperglycemia: Secondary | ICD-10-CM | POA: Diagnosis not present

## 2019-04-14 MED ORDER — CEFTRIAXONE SODIUM 1 G IJ SOLR
1.0000 g | Freq: Once | INTRAMUSCULAR | Status: AC
  Administered 2019-04-15: 10:00:00 1 g via INTRAMUSCULAR

## 2019-04-14 NOTE — Progress Notes (Signed)
Chronic Care Management   Visit Note  04/14/2019 Name: [name] MRN: [id number] DOB: [birth year]  Referred by: [name], MD Reason for referral : Chronic Care Management   Vallorie FALON HUESCA is a 68 y.o. year old female who is a primary care patient of [name], MD. The CCM team was consulted for assistance with chronic disease management and care coordination needs.   Review of patient status, including review of consultants reports, relevant laboratory and other test results, and collaboration with appropriate care team members and the patient's provider was performed as part of comprehensive patient evaluation and provision of chronic care management services.    I spoke with Ms. Parisi by telephone today.  Medications: Outpatient Encounter Medications as of 04/14/2019  Medication Sig Note  . albuterol (PROAIR HFA) 108 (90 Base) MCG/ACT inhaler 2 puffs every 4 hours as needed only  if your can't catch your breath (Patient taking differently: Inhale 1-2 puffs into the lungs every 4 (four) hours as needed for wheezing or shortness of breath. )   . amLODipine (NORVASC) 10 MG tablet TAKE 1 TABLET(10 MG) BY MOUTH EVERY EVENING (Patient taking differently: Take 10 mg by mouth at bedtime. )   . Continuous Blood Gluc Receiver (FREESTYLE LIBRE READER) DEVI by Does not apply route. CGM device-Freestyle Libre   . Continuous Blood Gluc Sensor (FREESTYLE LIBRE 14 DAY SENSOR) MISC by Does not apply route. CGM 14-day sensors; send refills to [facility] (in [place])   . docusate sodium (COLACE) 100 MG capsule Take 100 mg by mouth 2 (two) times daily as needed for mild constipation.   . gabapentin (NEURONTIN) 300 MG capsule Take 1 capsule (300 mg total) by mouth 3 (three) times daily.   [name] glucose blood test strip Check blood sugar before meals and before bedtime   . insulin lispro (HUMALOG) 100 UNIT/ML injection Inject 25 units in AM, 35 units at lunchtime, and 25 units at  evening meal (Patient taking differently: Inject 25-35 Units into the skin See admin instructions. Inject 25 units subcutaneously before breakfast and supper, inject 35 units before lunch)   . Lancets Misc. MISC 1 each by Does not apply route 4 (four) times daily -  with meals and at bedtime.   [name] losartan (COZAAR) 50 MG tablet Take 1 tablet (50 mg total) by mouth daily.   . mirabegron ER (MYRBETRIQ) 50 MG TB24 tablet Take 50 mg by mouth daily.   . Multiple Vitamin (MULTIVITAMIN WITH MINERALS) TABS tablet Take 1 tablet by mouth daily.   . naproxen sodium (ALEVE) 220 MG tablet Take 220 mg by mouth 2 (two) times daily as needed (pain).   . Nebivolol HCl (BYSTOLIC) 20 MG TABS Take 1 tablet (20 mg total) by mouth daily.   . nitrofurantoin (MACRODANTIN) 100 MG capsule Take 100 mg by mouth daily. 04/06/2019: continuous  . OZEMPIC, 1 MG/DOSE, 2 MG/1.5ML SOPN INJECT 1 MG INTO THE SKIN ONCE A WEEK (Patient taking differently: Inject 1 mg into the skin every Thursday. )   . pantoprazole (PROTONIX) 40 MG tablet Take 30- 60 min before your first and last meals of the day (Patient taking differently: Take 40 mg by mouth daily before breakfast. )   . pravastatin (PRAVACHOL) 80 MG tablet TAKE 1 TABLET BY MOUTH EVERY DAY (Patient taking differently: Take 40 mg by mouth daily. )   . spironolactone (ALDACTONE) 50 MG tablet TAKE 1 TABLET(50 MG) BY MOUTH DAILY (Patient taking differently: Take 50 mg by mouth  daily. )   . traMADol (ULTRAM) 50 MG tablet Take 1-2 tablets (50-100 mg total) by mouth every 6 (six) hours as needed (pain). (Patient not taking: Reported on 04/06/2019)   . Vitamin D, Ergocalciferol, (DRISDOL) 1.25 MG (50000 UT) CAPS capsule TAKE 1 CAPSULE BY MOUTH EVERY 7 DAYS ON WEDNESDAY (Patient taking differently: Take 50,000 Units by mouth every Thursday. )    No facility-administered encounter medications on file as of 04/14/2019.      Objective:   Goals Addressed            This Visit's Progress      Patient Stated   . I want to control my diabetes (pt-stated)       Current Barriers:  [name] Knowledge Deficits related to diabetes diet & lifestyle modifications . Non Adherence to prescribed medication regimen  Pharmacist Clinical Goal(s):  [name] Over the next 90 days, patient will work with PharmD and PCP to address needs related to optimized medication management of chronic conditions  Interventions: . Comprehensive medication review performed.  Reviewed medication fill history via insurance claims data. . Reviewed & discussed the following diabetes-related information with patient: o Encouraged patient to continue checking blood sugars as directed-->patient now has scanner glucometer (FreeStyle [place]), will be able to download data at PCP office during visits o Confirmed current DM regimen: Humalog 25u breakfast, 35u lunch, 25u dinner; Ozempic 1mg  weekly.  Patient reports she is content with this regimen and is tolerating it well. o Reviewed medication purpose/side effects-->patient denies adverse events, denies hypoglycemia, reports FBG<150.  Denies BG>180.  She has enjoyed scanning her arm for BG vs. Lancet method.   o Continue to follow ADA recommended "diabetes-friendly" diet  (reviewed healthy snack/food options).  Counseled patient to avoid sugary drinks and foods.  She is working to decrease potato & carbohydrates.  She is also able to walk more because she has had her bladder procedure o Most recent A1c is 8.7 on 03/19/19 (decrease from 12.0 in March 2020) o Patient has not filled statin in 2020 per claims data. Will continue to discuss benefits of taking statin in diabetes. o Continue taking all medications as prescribed by provider  Patient Self Care Activities:  . Self administers medications as prescribed . Attends all scheduled provider appointments . Calls pharmacy for medication refills . Performs ADL's independently . Calls provider office for new concerns or questions  Please  see past updates related to this goal by clicking on the "Past Updates" button in the selected goal        Plan:   The care management team will reach out to the patient again over the next 4 weeks.  Regina Eck, PharmD, BCPS Clinical Pharmacist, [facility] [place]: [phone]

## 2019-04-14 NOTE — Patient Instructions (Addendum)
Visit Information  Goals Addressed            This Visit's Progress     Patient Stated   . I want to control my diabetes (pt-stated)       Current Barriers:  [name] Knowledge Deficits related to diabetes diet & lifestyle modifications . Non Adherence to prescribed medication regimen  Pharmacist Clinical Goal(s):  [name] Over the next 90 days, patient will work with PharmD and PCP to address needs related to optimized medication management of chronic conditions  Interventions: . Comprehensive medication review performed.  Reviewed medication fill history via insurance claims data. . Reviewed & discussed the following diabetes-related information with patient: o Encouraged patient to continue checking blood sugars as directed-->patient now has scanner glucometer (FreeStyle [place]), will be able to download data at PCP office during visits o Confirmed current DM regimen: Humalog 25u breakfast, 35u lunch, 25u dinner; Ozempic 1mg  weekly.  Patient reports she is content with this regimen and is tolerating it well. o Reviewed medication purpose/side effects-->patient denies adverse events, denies hypoglycemia, reports FBG<150.  Denies BG>180.  She has enjoyed scanning her arm for BG vs. Lancet method.   o Continue to follow ADA recommended "diabetes-friendly" diet  (reviewed healthy snack/food options).  Counseled patient to avoid sugary drinks and foods.  She is working to decrease potato & carbohydrates.  She is also able to walk more because she has had her bladder procedure o Most recent A1c is 8.7 on 03/19/19 (decrease from 12.0 in March 2020) o Patient has not filled statin in 2020 per claims data. Will continue to discuss benefits of taking statin in diabetes. o Continue taking all medications as prescribed by provider  Patient Self Care Activities:  . Self administers medications as prescribed . Attends all scheduled provider appointments . Calls pharmacy for medication refills . Performs ADL's  independently . Calls provider office for new concerns or questions  Initial goal documentation        The patient verbalized understanding of instructions provided today and declined a print copy of patient instruction materials.   The care management team will reach out to the patient again over the next 4 weeks.  Regina Eck, PharmD, BCPS Clinical Pharmacist, [facility] [place]: [phone]

## 2019-04-14 NOTE — Progress Notes (Signed)
Virtual Visit via video   This visit type was conducted due to national recommendations for restrictions regarding the COVID-19 Pandemic (e.g. social distancing) in an effort to limit this patient's exposure and mitigate transmission in our community.  Due to her co-morbid illnesses, this patient is at least at moderate risk for complications without adequate follow up.  This format is felt to be most appropriate for this patient at this time.  All issues noted in this document were discussed and addressed.  A limited physical exam was performed with this format.    This visit type was conducted due to national recommendations for restrictions regarding the COVID-19 Pandemic (e.g. social distancing) in an effort to limit this patient's exposure and mitigate transmission in our community.  Patients identity confirmed using two different identifiers.  This format is felt to be most appropriate for this patient at this time.  All issues noted in this document were discussed and addressed.  No physical exam was performed (except for noted visual exam findings with Video Visits).    Date:  04/26/2019   ID:  Ilanna, Deihl 12/13/1950, MRN [id number]  Patient Location:  Home - spoke with Harriet Masson  Provider location:   Office    Chief Complaint:  Hospital follow up  History of Present Illness:    GIDGET QUIZHPI is a 68 y.o. female who presents via video conferencing for a telehealth visit today.    The patient does not have symptoms concerning for COVID-19 infection (fever, chills, cough, or new shortness of breath).   Hospital follow up after having an episode of passing out. She does not remember what happened.  Had CT scan head and echo which were normal.  She did not feel weak. After she got home and coming up the stairs her legs became weak.  When she gets up rapidly may feel dizzy.    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  She  has been having diarrhea.  Her bowel movements are loose.    Urinary tract infection - treated with nitrofuratoin which is not sensitive.  She did take bactrim for a few days but irritated her stomach.      Past Medical History:  Diagnosis Date  . Arthritis    L knee, hands, back   . Asthma   . Diabetes mellitus type 2, insulin dependent ([place])   . Fibromyalgia   . Full dentures   . GERD (gastroesophageal reflux disease)   . H/O hiatal hernia   . Headache    h/o migraines - was followed for a time with a wellness doctor  . History of esophageal dilatation   . History of rhabdomyolysis    03/ 2015  . Hyperlipidemia   . Hypertension   . Insulin pump in place    since 12/ 2014--  [facility]  . Osteoarthritis of left knee, primary localized 08/17/2014  . Renal insufficiency   . SUI (stress urinary incontinence, female)   . Wears glasses    Past Surgical History:  Procedure Laterality Date  . BLADDER SUSPENSION N/A 03/09/2014   Procedure: CYSTOSCOPY/SLING;  Surgeon: Reece Packer, MD;  [facility];  Service: Urology;  Laterality: N/A;  . CARDIAC CATHETERIZATION  07-20-2009   DR Shelva Majestic   MODERATE LVH/  NORMAL  LVEF/  NORMAL CORONARY AND RENAL ARTERIES  . CARPAL TUNNEL RELEASE Right 2000  . CHOLECYSTECTOMY  1990  . COLONOSCOPY  WITH ESOPHAGOGASTRODUODENOSCOPY (EGD)  06-18-2002  . ESOPHAGOGASTRODUODENOSCOPY (EGD) WITH ESOPHAGEAL DILATION  06-12-2010  . EXCISION LEFT UPPER ARM MASS  01-29-2014  . EYE SURGERY  2010   laser on R eye  . INTERSTIM IMPLANT REMOVAL N/A 03/06/2019   Procedure: REMOVAL OF INTERSTIM IMPLANT;  Surgeon: MacDiarmid, Scott, MD;  Location: [facility];  Service: Urology;  Laterality: N/A;  . KNEE ARTHROSCOPY Right 1989  &  2002  . NEGATIVE SLEEP STUDY  2012   PER PT  . PARTIAL KNEE ARTHROPLASTY Left 08/17/2014   Procedure: LEFT UNICOMPARTMENTAL KNEE;  Surgeon: Joshua P Landau, MD;  Location: [facility];  Service: Orthopedics;   Laterality: Left;     Current Meds  Medication Sig  . albuterol (PROAIR HFA) 108 (90 Base) MCG/ACT inhaler 2 puffs every 4 hours as needed only  if your can't catch your breath (Patient taking differently: Inhale 1-2 puffs into the lungs every 4 (four) hours as needed for wheezing or shortness of breath. )  . amLODipine (NORVASC) 10 MG tablet TAKE 1 TABLET(10 MG) BY MOUTH EVERY EVENING (Patient taking differently: Take 10 mg by mouth at bedtime. )  . Continuous Blood Gluc Receiver (FREESTYLE LIBRE READER) DEVI by Does not apply route. CGM device-Freestyle Libre  . Continuous Blood Gluc Sensor (FREESTYLE LIBRE 14 DAY SENSOR) MISC by Does not apply route. CGM 14-day sensors; send refills to [facility] (in [place])  . docusate sodium (COLACE) 100 MG capsule Take 100 mg by mouth 2 (two) times daily as needed for mild constipation.  . gabapentin (NEURONTIN) 300 MG capsule Take 1 capsule (300 mg total) by mouth 3 (three) times daily.  . insulin lispro (HUMALOG) 100 UNIT/ML injection Inject 25 units in AM, 35 units at lunchtime, and 25 units at evening meal (Patient taking differently: Inject 25-35 Units into the skin See admin instructions. Inject 25 units subcutaneously before breakfast and supper, inject 35 units before lunch)  . Lancets Misc. MISC 1 each by Does not apply route 4 (four) times daily -  with meals and at bedtime.  . losartan (COZAAR) 50 MG tablet Take 1 tablet (50 mg total) by mouth daily.  . mirabegron ER (MYRBETRIQ) 50 MG TB24 tablet Take 50 mg by mouth daily.  . Multiple Vitamin (MULTIVITAMIN WITH MINERALS) TABS tablet Take 1 tablet by mouth daily.  . naproxen sodium (ALEVE) 220 MG tablet Take 220 mg by mouth 2 (two) times daily as needed (pain).  . Nebivolol HCl (BYSTOLIC) 20 MG TABS Take 1 tablet (20 mg total) by mouth daily.  . nitrofurantoin (MACRODANTIN) 100 MG capsule Take 100 mg by mouth daily.  . OZEMPIC, 1 MG/DOSE, 2 MG/1.5ML SOPN INJECT 1 MG INTO THE SKIN ONCE A WEEK  (Patient taking differently: Inject 1 mg into the skin every Thursday. )  . pantoprazole (PROTONIX) 40 MG tablet Take 30- 60 min before your first and last meals of the day (Patient taking differently: Take 40 mg by mouth daily before breakfast. )  . pravastatin (PRAVACHOL) 80 MG tablet TAKE 1 TABLET BY MOUTH EVERY DAY (Patient taking differently: Take 40 mg by mouth daily. )  . spironolactone (ALDACTONE) 50 MG tablet TAKE 1 TABLET(50 MG) BY MOUTH DAILY (Patient taking differently: Take 50 mg by mouth daily. )  . Vitamin D, Ergocalciferol, (DRISDOL) 1.25 MG (50000 UT) CAPS capsule TAKE 1 CAPSULE BY MOUTH EVERY 7 DAYS ON WEDNESDAY (Patient taking differently: Take 50,000 Units by mouth every Thursday. )     Allergies:     Pollen extract, Tomato, and Codeine   Social History   Tobacco Use  . Smoking status: Never Smoker  . Smokeless tobacco: Never Used  Substance Use Topics  . Alcohol use: No  . Drug use: No     Family Hx: The patient's family history includes Cancer in her maternal grandmother; Diabetes in her brother, father, maternal grandmother, and mother; Heart failure in her sister; Hypertension in her father and mother; Lung cancer in her father and mother.  ROS:   Please see the history of present illness.    Review of Systems  Constitutional: Negative.   Respiratory: Negative.   Cardiovascular: Negative.   Musculoskeletal: Negative.   Neurological: Negative for dizziness and headaches.       Syncope with recent hospitalization  Psychiatric/Behavioral: Negative.     All other systems reviewed and are negative.   Labs/Other Tests and Data Reviewed:    Recent Labs: 06/09/2018: TSH 1.990 03/19/2019: ALT 11 04/15/2019: BUN 13; Creatinine, Ser 1.23; Hemoglobin 13.4; Platelets 243; Potassium 4.8; Sodium 140   Recent Lipid Panel Lab Results  Component Value Date/Time   CHOL 185 09/07/2016 01:41 PM   TRIG 78.0 09/07/2016 01:41 PM   HDL 49.10 09/07/2016 01:41 PM   CHOLHDL 4  09/07/2016 01:41 PM   LDLCALC 120 (H) 09/07/2016 01:41 PM   LDLDIRECT 152.8 10/09/2013 09:38 AM    Wt Readings from Last 3 Encounters:  04/14/19 155 lb 3.2 oz (70.4 kg)  04/07/19 139 lb (63 kg)  03/24/19 154 lb 3.2 oz (69.9 kg)     Exam:    Vital Signs:  BP (!) 150/78 (BP Location: Left Arm, Patient Position: Sitting, Cuff Size: Small)   Pulse 76   Temp (!) 97 F (36.1 C) (Oral)   Ht 5' 4" (1.626 m)   Wt 155 lb 3.2 oz (70.4 kg)   LMP  (LMP Unknown)   BMI 26.64 kg/m     Physical Exam  Constitutional: She is oriented to person, place, and time and well-developed, well-nourished, and in no distress. No distress.  Pulmonary/Chest: Effort normal.  Neurological: She is alert and oriented to person, place, and time.  Psychiatric: Mood, memory, affect and judgment normal.    ASSESSMENT & PLAN:    1. Urinary tract infection without hematuria, site unspecified  While hospitalized determined she had a urinary tract infection this may have been the cause for her syncopal episodes  Urine culture revealed ceftriaxone was more effective - cefTRIAXone (ROCEPHIN) injection 1 g - CBC - BMP8+eGFR; Future - Ambulatory referral to Neurology  2. Vasovagal syncope  While getting out of shower she had a syncopal episode  She has had several of these episodes and her blood sugar was within normal range I will refer to neurology for further evaluation - BMP8+eGFR; Future  3. Diarrhea, unspecified type  Could be related to the antibiotic use however will make sure not infectious process - Ova and parasite examination - Clostridium difficile Toxin A/B - Culture, Stool   COVID-19 Education: The signs and symptoms of COVID-19 were discussed with the patient and how to seek care for testing (follow up with PCP or arrange E-visit).  The importance of social distancing was discussed today.  Patient Risk:   After full review of this patients clinical status, I feel that they are at least  moderate risk at this time.  Time:   Today, I have spent 22 minutes/ seconds with the patient with telehealth technology discussing above diagnoses.       Medication Adjustments/Labs and Tests Ordered: Current medicines are reviewed at length with the patient today.  Concerns regarding medicines are outlined above.   Tests Ordered: Orders Placed This Encounter  Procedures  . Ova and parasite examination  . Clostridium difficile Toxin A/B  . Culture, Stool  . CBC  . BMP8+eGFR  . Ambulatory referral to Neurology    Medication Changes: Meds ordered this encounter  Medications  . cefTRIAXone (ROCEPHIN) injection 1 g    Disposition:  Follow up prn  Signed, [name], FNP

## 2019-04-14 NOTE — Patient Instructions (Signed)
Urinary Tract Infection, Adult A urinary tract infection (UTI) is an infection of any part of the urinary tract. The urinary tract includes:  The kidneys.  The ureters.  The bladder.  The urethra. These organs make, store, and get rid of pee (urine) in the body. What are the causes? This is caused by germs (bacteria) in your genital area. These germs grow and cause swelling (inflammation) of your urinary tract. What increases the risk? You are more likely to develop this condition if:  You have a small, thin tube (catheter) to drain pee.  You cannot control when you pee or poop (incontinence).  You are female, and: ? You use these methods to prevent pregnancy: ? A medicine that kills sperm (spermicide). ? A device that blocks sperm (diaphragm). ? You have low levels of a female hormone (estrogen). ? You are pregnant.  You have genes that add to your risk.  You are sexually active.  You take antibiotic medicines.  You have trouble peeing because of: ? A prostate that is bigger than normal, if you are female. ? A blockage in the part of your body that drains pee from the bladder (urethra). ? A kidney stone. ? A nerve condition that affects your bladder (neurogenic bladder). ? Not getting enough to drink. ? Not peeing often enough.  You have other conditions, such as: ? Diabetes. ? A weak disease-fighting system (immune system). ? Sickle cell disease. ? Gout. ? Injury of the spine. What are the signs or symptoms? Symptoms of this condition include:  Needing to pee right away (urgently).  Peeing often.  Peeing small amounts often.  Pain or burning when peeing.  Blood in the pee.  Pee that smells bad or not like normal.  Trouble peeing.  Pee that is cloudy.  Fluid coming from the vagina, if you are female.  Pain in the belly or lower back. Other symptoms include:  Throwing up (vomiting).  No urge to eat.  Feeling mixed up (confused).  Being tired  and grouchy (irritable).  A fever.  Watery poop (diarrhea). How is this treated? This condition may be treated with:  Antibiotic medicine.  Other medicines.  Drinking enough water. Follow these instructions at home:  Medicines  Take over-the-counter and prescription medicines only as told by your doctor.  If you were prescribed an antibiotic medicine, take it as told by your doctor. Do not stop taking it even if you start to feel better. General instructions  Make sure you: ? Pee until your bladder is empty. ? Do not hold pee for a long time. ? Empty your bladder after sex. ? Wipe from front to back after pooping if you are a female. Use each tissue one time when you wipe.  Drink enough fluid to keep your pee pale yellow.  Keep all follow-up visits as told by your doctor. This is important. Contact a doctor if:  You do not get better after 1-2 days.  Your symptoms go away and then come back. Get help right away if:  You have very bad back pain.  You have very bad pain in your lower belly.  You have a fever.  You are sick to your stomach (nauseous).  You are throwing up. Summary  A urinary tract infection (UTI) is an infection of any part of the urinary tract.  This condition is caused by germs in your genital area.  There are many risk factors for a UTI. These include having a small, thin   tube to drain pee and not being able to control when you pee or poop.  Treatment includes antibiotic medicines for germs.  Drink enough fluid to keep your pee pale yellow. This information is not intended to replace advice given to you by your health care provider. Make sure you discuss any questions you have with your health care provider. Document Released: 01/30/2008 Document Revised: 07/31/2018 Document Reviewed: 02/20/2018 Elsevier Patient Education  2020 [employer] Inc[employer]  

## 2019-04-15 ENCOUNTER — Other Ambulatory Visit: Payer: Self-pay

## 2019-04-15 ENCOUNTER — Other Ambulatory Visit: Payer: Self-pay | Admitting: Nurse Practitioner

## 2019-04-15 ENCOUNTER — Other Ambulatory Visit: Payer: Medicare Other

## 2019-04-15 DIAGNOSIS — R197 Diarrhea, unspecified: Secondary | ICD-10-CM | POA: Diagnosis not present

## 2019-04-15 DIAGNOSIS — R55 Syncope and collapse: Secondary | ICD-10-CM | POA: Diagnosis not present

## 2019-04-15 DIAGNOSIS — Z09 Encounter for follow-up examination after completed treatment for conditions other than malignant neoplasm: Secondary | ICD-10-CM | POA: Diagnosis not present

## 2019-04-15 DIAGNOSIS — N390 Urinary tract infection, site not specified: Secondary | ICD-10-CM | POA: Diagnosis not present

## 2019-04-15 LAB — CBC
Hematocrit: 41.3 % (ref 34.0–46.6)
Hemoglobin: 13.4 g/dL (ref 11.1–15.9)
MCH: 29.6 pg (ref 26.6–33.0)
MCHC: 32.4 g/dL (ref 31.5–35.7)
MCV: 91 fL (ref 79–97)
Platelets: 243 10*3/uL (ref 150–450)
RBC: 4.53 x10E6/uL (ref 3.77–5.28)
RDW: 12.4 % (ref 11.7–15.4)
WBC: 5.4 10*3/uL (ref 3.4–10.8)

## 2019-04-15 NOTE — Addendum Note (Signed)
Addended by: Steward Ros on: 04/15/2019 09:40 AM   Modules accepted: Orders

## 2019-04-16 LAB — BMP8+EGFR
BUN/Creatinine Ratio: 11 — ABNORMAL LOW (ref 12–28)
BUN: 13 mg/dL (ref 8–27)
CO2: 26 mmol/L (ref 20–29)
Calcium: 9.6 mg/dL (ref 8.7–10.3)
Chloride: 101 mmol/L (ref 96–106)
Creatinine, Ser: 1.23 mg/dL — ABNORMAL HIGH (ref 0.57–1.00)
GFR calc Af Amer: 53 mL/min/{1.73_m2} — ABNORMAL LOW (ref >59)
GFR calc non Af Amer: 46 mL/min/{1.73_m2} — ABNORMAL LOW (ref >59)
Glucose: 260 mg/dL — ABNORMAL HIGH (ref 65–99)
Potassium: 4.8 mmol/L (ref 3.5–5.2)
Sodium: 140 mmol/L (ref 134–144)

## 2019-04-20 DIAGNOSIS — Z794 Long term (current) use of insulin: Secondary | ICD-10-CM | POA: Diagnosis not present

## 2019-04-20 DIAGNOSIS — E118 Type 2 diabetes mellitus with unspecified complications: Secondary | ICD-10-CM | POA: Diagnosis not present

## 2019-04-20 LAB — OVA AND PARASITE EXAMINATION

## 2019-04-20 LAB — STOOL CULTURE: E coli, Shiga toxin Assay: NEGATIVE

## 2019-04-20 LAB — CLOSTRIDIUM DIFFICILE EIA: C difficile Toxins A+B, EIA: NEGATIVE

## 2019-04-26 ENCOUNTER — Encounter: Payer: Self-pay | Admitting: Nurse Practitioner

## 2019-04-27 ENCOUNTER — Other Ambulatory Visit: Payer: Self-pay | Admitting: Nurse Practitioner

## 2019-04-27 DIAGNOSIS — E1165 Type 2 diabetes mellitus with hyperglycemia: Secondary | ICD-10-CM

## 2019-04-28 ENCOUNTER — Ambulatory Visit: Payer: Medicare Other | Admitting: Neurology

## 2019-05-01 ENCOUNTER — Telehealth: Payer: Self-pay

## 2019-05-12 ENCOUNTER — Telehealth: Payer: Self-pay

## 2019-05-21 DIAGNOSIS — E118 Type 2 diabetes mellitus with unspecified complications: Secondary | ICD-10-CM | POA: Diagnosis not present

## 2019-05-21 DIAGNOSIS — Z794 Long term (current) use of insulin: Secondary | ICD-10-CM | POA: Diagnosis not present

## 2019-05-26 ENCOUNTER — Ambulatory Visit (INDEPENDENT_AMBULATORY_CARE_PROVIDER_SITE_OTHER): Payer: Medicare Other | Admitting: Pharmacist

## 2019-05-26 DIAGNOSIS — E1165 Type 2 diabetes mellitus with hyperglycemia: Secondary | ICD-10-CM | POA: Diagnosis not present

## 2019-05-26 DIAGNOSIS — I10 Essential (primary) hypertension: Secondary | ICD-10-CM

## 2019-05-26 NOTE — Patient Instructions (Addendum)
Visit Information  Goals Addressed            This Visit's Progress     Patient Stated   . I want to control my diabetes (pt-stated)       Current Barriers:  [name] Knowledge Deficits related to diabetes diet & lifestyle modifications . Non Adherence to prescribed medication regimen  Pharmacist Clinical Goal(s):  [name] Over the next 90 days, patient will work with PharmD and PCP to address needs related to optimized medication management of chronic conditions  Interventions: . Comprehensive medication review performed.  Reviewed medication fill history via insurance claims data. . Reviewed & discussed the following diabetes-related information with patient: o Encouraged patient to continue checking blood sugars as directed-->patient now has scanner glucometer (FreeStyle [place]), will be able to download data at PCP office during visits.  o Confirmed current DM regimen: Humalog 25u breakfast, 35u lunch, 25u dinner; Ozempic 1mg  weekly.  Patient reports she is content with this regimen and is tolerating it well. o Reviewed medication purpose/side effects-->patient denies adverse events, denies hypoglycemia, reports FBG<150.  Denies BG>180.  She has enjoyed scanning her arm for BG vs. Lancet method.   o Continue to follow ADA recommended "diabetes-friendly" diet  (reviewed healthy snack/food options).  Counseled patient to avoid sugary drinks and foods.  She is working to decrease potato & carbohydrates.  She is also able to walk more because she has had her bladder procedure.  Her bladder condition is improving. o Most recent A1c is 8.7 on 03/19/19 (decrease from 12.0 in March 2020) o Patient has not filled statin in 2020 per claims data. Patient states she did not have a refill.  Discussed the benefits of taking statin in diabetes.  Patient states she will try pravastatin daily as she has not had statin intolerance in the past.  Refill to be called into [facility]. o Continue taking all medications as  prescribed by provider. Reminded patient of PCP appt in November.  Patient Self Care Activities:  . Self administers medications as prescribed . Attends all scheduled provider appointments . Calls pharmacy for medication refills . Performs ADL's independently . Calls provider office for new concerns or questions  Please see past updates related to this goal by clicking on the "Past Updates" button in the selected goal         The patient verbalized understanding of instructions provided today and declined a print copy of patient instruction materials.   The care management team will reach out to the patient again over the next 4-6 weeks.  Regina Eck, PharmD, BCPS Clinical Pharmacist, [facility] [place]: [phone]

## 2019-05-26 NOTE — Progress Notes (Signed)
Chronic Care Management   Visit Note  05/26/2019 Name: [name] MRN: [id number] DOB: [birth year]  Referred by: [name], DNP, FNP Reason for referral : Chronic Care Management   [name] is a 68 y.o. year old female who is a primary care patient of [name], MD. The CCM team was consulted for assistance with chronic disease management and care coordination needs.   Review of patient status, including review of consultants reports, relevant laboratory and other test results, and collaboration with appropriate care team members and the patient's provider was performed as part of comprehensive patient evaluation and provision of chronic care management services.     I spoke with [name] by telephone today.  Advanced Directives Status: N See Care Plan and Vyn[employer] application for related entries.   Medications: Outpatient Encounter Medications as of 05/26/2019  Medication Sig Note  . albuterol (PROAIR HFA) 108 (90 Base) MCG/ACT inhaler 2 puffs every 4 hours as needed only  if your can't catch your breath (Patient taking differently: Inhale 1-2 puffs into the lungs every 4 (four) hours as needed for wheezing or shortness of breath. )   . amLODipine (NORVASC) 10 MG tablet TAKE 1 TABLET(10 MG) BY MOUTH EVERY EVENING (Patient taking differently: Take 10 mg by mouth at bedtime. )   . Continuous Blood Gluc Receiver (FREESTYLE LIBRE READER) DEVI by Does not apply route. CGM device-Freestyle Libre   . Continuous Blood Gluc Sensor (FREESTYLE LIBRE 14 DAY SENSOR) MISC by Does not apply route. CGM 14-day sensors; send refills to [facility] (in [place])   . docusate sodium (COLACE) 100 MG capsule Take 100 mg by mouth 2 (two) times daily as needed for mild constipation.   . gabapentin (NEURONTIN) 300 MG capsule Take 1 capsule (300 mg total) by mouth 3 (three) times daily.   . insulin lispro (HUMALOG) 100 UNIT/ML injection Inject 0.25-0.35 mLs (25-35 Units total) into the  skin See admin instructions. Inject 25 units subcutaneously before breakfast and supper, inject 35 units before lunch   . Lancets Misc. MISC 1 each by Does not apply route 4 (four) times daily -  with meals and at bedtime.   [name] losartan (COZAAR) 50 MG tablet Take 1 tablet (50 mg total) by mouth daily.   . mirabegron ER (MYRBETRIQ) 50 MG TB24 tablet Take 50 mg by mouth daily.   . Multiple Vitamin (MULTIVITAMIN WITH MINERALS) TABS tablet Take 1 tablet by mouth daily.   . naproxen sodium (ALEVE) 220 MG tablet Take 220 mg by mouth 2 (two) times daily as needed (pain).   . Nebivolol HCl (BYSTOLIC) 20 MG TABS Take 1 tablet (20 mg total) by mouth daily.   . nitrofurantoin (MACRODANTIN) 100 MG capsule Take 100 mg by mouth daily. 04/06/2019: continuous  . OZEMPIC, 1 MG/DOSE, 2 MG/1.5ML SOPN INJECT 1 MG INTO THE SKIN ONCE A WEEK (Patient taking differently: Inject 1 mg into the skin every Thursday. )   . pantoprazole (PROTONIX) 40 MG tablet Take 30- 60 min before your first and last meals of the day (Patient taking differently: Take 40 mg by mouth daily before breakfast. )   . pravastatin (PRAVACHOL) 80 MG tablet TAKE 1 TABLET BY MOUTH EVERY DAY (Patient taking differently: Take 40 mg by mouth daily. )   . spironolactone (ALDACTONE) 50 MG tablet TAKE 1 TABLET(50 MG) BY MOUTH DAILY (Patient taking differently: Take 50 mg by mouth daily. )   . Vitamin D, Ergocalciferol, (DRISDOL) 1.25 MG (50000 UT)  CAPS capsule TAKE 1 CAPSULE BY MOUTH EVERY 7 DAYS ON WEDNESDAY (Patient taking differently: Take 50,000 Units by mouth every Thursday. )    No facility-administered encounter medications on file as of 05/26/2019.      Objective:   Goals Addressed            This Visit's Progress     Patient Stated   . I want to control my diabetes (pt-stated)       Current Barriers:  [name] Knowledge Deficits related to diabetes diet & lifestyle modifications . Non Adherence to prescribed medication regimen  Pharmacist  Clinical Goal(s):  [name] Over the next 90 days, patient will work with PharmD and PCP to address needs related to optimized medication management of chronic conditions  Interventions: . Comprehensive medication review performed.  Reviewed medication fill history via insurance claims data. . Reviewed & discussed the following diabetes-related information with patient: o Encouraged patient to continue checking blood sugars as directed-->patient now has scanner glucometer (FreeStyle [place]), will be able to download data at PCP office during visits.  o Confirmed current DM regimen: Humalog 25u breakfast, 35u lunch, 25u dinner; Ozempic 1mg  weekly.  Patient reports she is content with this regimen and is tolerating it well. o Reviewed medication purpose/side effects-->patient denies adverse events, denies hypoglycemia, reports FBG<150.  Denies BG>180.  She has enjoyed scanning her arm for BG vs. Lancet method.   o Continue to follow ADA recommended "diabetes-friendly" diet  (reviewed healthy snack/food options).  Counseled patient to avoid sugary drinks and foods.  She is working to decrease potato & carbohydrates.  She is also able to walk more because she has had her bladder procedure.  Her bladder condition is improving. o Most recent A1c is 8.7 on 03/19/19 (decrease from 12.0 in March 2020) o Patient has not filled statin in 2020 per claims data. Patient states she did not have a refill.  Discussed the benefits of taking statin in diabetes.  Patient states she will try pravastatin daily as she has not had statin intolerance in the past.  Refill to be [facility] into [facility]. o Continue taking all medications as prescribed by provider. Reminded patient of PCP appt in November.  Patient Self Care Activities:  . Self administers medications as prescribed . Attends all scheduled provider appointments . Calls pharmacy for medication refills . Performs ADL's independently . Calls provider office for new concerns  or questions  Please see past updates related to this goal by clicking on the "Past Updates" button in the selected goal           Plan:   The care management team will reach out to the patient again over the next 4-6 weeks.  Regina Eck, PharmD, BCPS Clinical Pharmacist, [facility] [place]: [phone]

## 2019-05-27 ENCOUNTER — Other Ambulatory Visit: Payer: Self-pay

## 2019-05-27 DIAGNOSIS — E1165 Type 2 diabetes mellitus with hyperglycemia: Secondary | ICD-10-CM

## 2019-05-27 DIAGNOSIS — I10 Essential (primary) hypertension: Secondary | ICD-10-CM

## 2019-05-27 MED ORDER — MIRABEGRON ER 50 MG PO TB24: 50.0000 mg | ORAL_TABLET | Freq: Every day | ORAL | 0 refills | Status: DC

## 2019-05-27 MED ORDER — PRAVASTATIN SODIUM 80 MG PO TABS: 40.0000 mg | ORAL_TABLET | Freq: Every day | ORAL | 2 refills | Status: DC

## 2019-05-27 MED ORDER — AMLODIPINE BESYLATE 10 MG PO TABS: 10.0000 mg | ORAL_TABLET | Freq: Every day | ORAL | 0 refills | Status: DC

## 2019-05-28 ENCOUNTER — Encounter: Payer: Self-pay | Admitting: Neurology

## 2019-05-28 ENCOUNTER — Other Ambulatory Visit: Payer: Self-pay

## 2019-05-28 ENCOUNTER — Ambulatory Visit (INDEPENDENT_AMBULATORY_CARE_PROVIDER_SITE_OTHER): Payer: Medicare Other | Admitting: Neurology

## 2019-05-28 ENCOUNTER — Telehealth: Payer: Self-pay | Admitting: Cardiovascular Disease

## 2019-05-28 VITALS — BP 136/84 | HR 98 | Temp 98.2°F | Ht 64.0 in | Wt 154.0 lb

## 2019-05-28 DIAGNOSIS — E1165 Type 2 diabetes mellitus with hyperglycemia: Secondary | ICD-10-CM | POA: Diagnosis not present

## 2019-05-28 DIAGNOSIS — I951 Orthostatic hypotension: Secondary | ICD-10-CM

## 2019-05-28 DIAGNOSIS — R7989 Other specified abnormal findings of blood chemistry: Secondary | ICD-10-CM

## 2019-05-28 DIAGNOSIS — Z9641 Presence of insulin pump (external) (internal): Secondary | ICD-10-CM

## 2019-05-28 DIAGNOSIS — N1832 Chronic kidney disease, stage 3b: Secondary | ICD-10-CM

## 2019-05-28 DIAGNOSIS — M1712 Unilateral primary osteoarthritis, left knee: Secondary | ICD-10-CM

## 2019-05-28 DIAGNOSIS — T796XXS Traumatic ischemia of muscle, sequela: Secondary | ICD-10-CM

## 2019-05-28 DIAGNOSIS — R55 Syncope and collapse: Secondary | ICD-10-CM

## 2019-05-28 NOTE — Progress Notes (Signed)
Provider:  Larey Seat, M D  Referring Provider: [name], MD Primary Care Physician:  [name], MD  Chief Complaint  Patient presents with   New Patient (Initial Visit)    pt alone, rm 10. pt states that she was working and all of a sudden she has had moment of syncope spells. she denies dizziness being associated. in completing orthrostatic she did have lightheadiness from supine to sitting position and then little balance swaying when she stood up    HPI:  [name] is a 68 y.o. female of African - American descent and  seen here on 05-28-2019 upon  referral  from Dr. Baird Cancer for a syncope evaluation.  The patient reports that her very first fainting spell occurred in spring 2019.  Since then she had 2 more, 1 in August 2019 and another in October 2019. She had a fall coming out of the shower in March 2020- she found herselfon the floor, no recollection of what happend and was home alone. She felt confused. She  was in pain, had hit her head on the floor.  There is a ED note from 04-06-2019, stating she came in after vasovagal syncope. Dr Narda Bonds. Here listed are CKD III, asthma, HTN, DM, Hypertensive urgency- reportedly [id number] mmhg systolic. Patient added GERD and sleep fragmentation.  ED records reviewed.  .  PS : At first the patient assured me that there had  been no spells reportedly for the last 12 months.  The patient was lost contacted by Dr. Baird Cancer for chronic medical care management on 14 April 2019, this time by telephone, she is going to see the patient face-to-face next months.  I reviewed the medication which includes Bystolic, Aleve, Cozaar, Myrbetriq, multivitamins, Humalog insulin gabapentin, Colace, Norvasc and pro-air inhaler, Macrodantin, Protonix, Pravachol, Aldactone, Ultram, vitamin D.   Her most recent hemoglobin A1c was 8.7 on 19 March 2019 and is much decreased from 42 th of March 2020. She developed diarrhea on 19th August and underwent  stool testing , took ATB afterwards.   There are 2 echos on record, several EKGs and cardiac telemetry monitoring reports, screen shots. All documented NSR-  But I did not find a cardiac monitoring for 7-30 days./   history taking is very difficult: Memory is significantly impaired, attention is limited,  Strokes and HTN, DM are running in maternal and paternal family.   The patient is divorced, single, has a room mate. Non smoker- never drank, caffeine- moderate - 1 cup of tea.  One adult son.  Review of Systems: Out of a complete 14 system review, the patient complains of only the following symptoms, and all other reviewed systems are negative. " I am not sure if had any fainting  since last year "  My memory is poor, I get lost when I drive.   I have a lot of coughing, and bladder infection , interstim device had to be removed.   Social History   Socioeconomic History   Marital status: Divorced    Spouse name: Not on file   Number of children: Not on file   Years of education: Not on file   Highest education level: Not on file  Occupational History   Occupation: disability  Social Needs   Financial resource strain: Not hard at all   Food insecurity    Worry: Never true    Inability: Never true   Transportation needs    Medical: No  Non-medical: No  Tobacco Use   Smoking status: Never Smoker   Smokeless tobacco: Never Used  Substance and Sexual Activity   Alcohol use: No   Drug use: No   Sexual activity: Not Currently  Lifestyle   Physical activity    Days per week: 3 days    Minutes per session: 60 min   Stress: Not at all  Relationships   Social connections    Talks on phone: Not on file    Gets together: Not on file    Attends religious service: Not on file    Active member of club or organization: Not on file    Attends meetings of clubs or organizations: Not on file    Relationship status: Not on file   Intimate partner violence     Fear of current or ex partner: No    Emotionally abused: No    Physically abused: No    Forced sexual activity: No  Other Topics Concern   Not on file  Social History Narrative   Not on file    Family History  Problem Relation Age of Onset   Diabetes Mother    Hypertension Mother    Lung cancer Mother        smoked   Diabetes Father    Hypertension Father    Lung cancer Father        smoked   Diabetes Brother    Heart failure Sister    Diabetes Maternal Grandmother    Cancer Maternal Grandmother     Past Medical History:  Diagnosis Date   Arthritis    L knee, hands, back    Asthma    Diabetes mellitus type 2, insulin dependent ([place])    Fibromyalgia    Full dentures    GERD (gastroesophageal reflux disease)    H/O hiatal hernia    Headache    h/o migraines - was followed for a time with a wellness doctor   History of esophageal dilatation    History of rhabdomyolysis    03/ 2015   Hyperlipidemia    Hypertension    Insulin pump in place    since 12/ 2014--  [employer]   Osteoarthritis of left knee, primary localized 08/17/2014   Renal insufficiency    SUI (stress urinary incontinence, female)    Wears glasses     Past Surgical History:  Procedure Laterality Date   BLADDER SUSPENSION N/A 03/09/2014   Procedure: CYSTOSCOPY/SLING;  Surgeon: Reece Packer, MD;  Location: [facility];  Service: Urology;  Laterality: N/A;   CARDIAC CATHETERIZATION  07-20-2009   DR Dickens  NORMAL  LVEF/  NORMAL CORONARY AND RENAL ARTERIES   CARPAL TUNNEL RELEASE Right 2000   CHOLECYSTECTOMY  1990   COLONOSCOPY WITH ESOPHAGOGASTRODUODENOSCOPY (EGD)  06-18-2002   ESOPHAGOGASTRODUODENOSCOPY (EGD) WITH ESOPHAGEAL DILATION  06-12-2010   EXCISION LEFT UPPER ARM MASS  01-29-2014   EYE SURGERY  2010   laser on R eye   INTERSTIM IMPLANT REMOVAL N/A 03/06/2019   Procedure: REMOVAL OF Barrie Lyme IMPLANT;  Surgeon:  Bjorn Loser, MD;  Location: [facility];  Service: Urology;  Laterality: N/A;   KNEE ARTHROSCOPY Right 1989  &  2002   NEGATIVE SLEEP STUDY  2012   PER PT   PARTIAL KNEE ARTHROPLASTY Left 08/17/2014   Procedure: LEFT UNICOMPARTMENTAL KNEE;  Surgeon: Johnny Bridge, MD;  Location: [facility];  Service: Orthopedics;  Laterality: Left;    Current  Outpatient Medications  Medication Sig Dispense Refill   albuterol (PROAIR HFA) 108 (90 Base) MCG/ACT inhaler 2 puffs every 4 hours as needed only  if your can't catch your breath (Patient taking differently: Inhale 1-2 puffs into the lungs every 4 (four) hours as needed for wheezing or shortness of breath. ) 1 Inhaler 11   amLODipine (NORVASC) 10 MG tablet Take 1 tablet (10 mg total) by mouth at bedtime. 90 tablet 0   Continuous Blood Gluc Receiver (FREESTYLE LIBRE READER) DEVI by Does not apply route. CGM device-Freestyle Libre     Continuous Blood Gluc Sensor (FREESTYLE LIBRE 14 DAY SENSOR) MISC by Does not apply route. CGM 14-day sensors; send refills to [facility] (in [place])     docusate sodium (COLACE) 100 MG capsule Take 100 mg by mouth 2 (two) times daily as needed for mild constipation.     gabapentin (NEURONTIN) 300 MG capsule Take 1 capsule (300 mg total) by mouth 3 (three) times daily. 90 capsule 3   insulin lispro (HUMALOG) 100 UNIT/ML injection Inject 0.25-0.35 mLs (25-35 Units total) into the skin See admin instructions. Inject 25 units subcutaneously before breakfast and supper, inject 35 units before lunch 40 mL 2   Lancets Misc. MISC 1 each by Does not apply route 4 (four) times daily -  with meals and at bedtime. 300 each 3   losartan (COZAAR) 50 MG tablet Take 1 tablet (50 mg total) by mouth daily. 90 tablet 3   mirabegron ER (MYRBETRIQ) 50 MG TB24 tablet Take 1 tablet (50 mg total) by mouth daily. 90 tablet 0   Multiple Vitamin (MULTIVITAMIN WITH MINERALS) TABS tablet Take 1 tablet by mouth daily.     naproxen sodium  (ALEVE) 220 MG tablet Take 220 mg by mouth 2 (two) times daily as needed (pain).     Nebivolol HCl (BYSTOLIC) 20 MG TABS Take 1 tablet (20 mg total) by mouth daily. 90 tablet 1   nitrofurantoin (MACRODANTIN) 100 MG capsule Take 100 mg by mouth daily.     OZEMPIC, 1 MG/DOSE, 2 MG/1.5ML SOPN INJECT 1 MG INTO THE SKIN ONCE A WEEK (Patient taking differently: Inject 1 mg into the skin every Thursday. ) 6 pen 1   pantoprazole (PROTONIX) 40 MG tablet Take 30- 60 min before your first and last meals of the day (Patient taking differently: Take 40 mg by mouth daily before breakfast. ) 60 tablet 11   pravastatin (PRAVACHOL) 80 MG tablet Take 0.5 tablets (40 mg total) by mouth daily. 90 tablet 2   spironolactone (ALDACTONE) 50 MG tablet TAKE 1 TABLET(50 MG) BY MOUTH DAILY (Patient taking differently: Take 50 mg by mouth daily. ) 90 tablet 1   Vitamin D, Ergocalciferol, (DRISDOL) 1.25 MG (50000 UT) CAPS capsule TAKE 1 CAPSULE BY MOUTH EVERY 7 DAYS ON WEDNESDAY (Patient taking differently: Take 50,000 Units by mouth every Thursday. ) 5 capsule 0   No current facility-administered medications for this visit.     Allergies as of 05/28/2019 - Review Complete 05/28/2019  Allergen Reaction Noted   Pollen extract Other (See Comments) 11/14/2017   Tomato Hives and Itching 03/27/2018   Codeine Hives, Itching, and Nausea And Vomiting 09/10/2012     CLINICAL DATA:  Headache, post traumaticC-spine trauma, ligamentous injury suspectedPt stated she had a fall today but don't remember where she fell except that she was at home.  EXAM: CT HEAD WITHOUT CONTRAST  CT CERVICAL SPINE WITHOUT CONTRAST  TECHNIQUE: Multidetector CT imaging of the head  and cervical spine was performed following the standard protocol without intravenous contrast. Multiplanar CT image reconstructions of the cervical spine were also generated.  COMPARISON:  10/01/2018  FINDINGS: CT HEAD FINDINGS  Brain: No evidence  of acute infarction, hemorrhage, hydrocephalus, extra-axial collection or mass lesion/mass effect.  Patchy white matter hypoattenuation is noted consistent with moderate chronic microvascular ischemic change.  Vascular: No hyperdense vessel or unexpected calcification.  Skull: Normal. Negative for fracture or focal lesion.  Sinuses/Orbits: Globes and orbits are unremarkable. The visualized sinuses and mastoid air cells are clear.  Other: Small left forehead scalp hematoma.  CT CERVICAL SPINE FINDINGS  Alignment: Mild kyphosis, apex at C5-C6. No spondylolisthesis/subluxation.  Skull base and vertebrae: No acute fracture. No primary bone lesion or focal pathologic process.  Soft tissues and spinal canal: No prevertebral fluid or swelling. No visible canal hematoma.  Disc levels: Moderate loss of disc height at C5-C6 and C6-C7. Endplate spurring and spondylotic disc bulging is noted most prominently at C5-C6. Mild facet degenerative change on the right at C4-C5. No convincing disc herniation.  Upper chest: No acute findings.  Clear lung apices.  Other: None.  IMPRESSION: HEAD CT  1. No acute intracranial abnormalities. 2. Moderate chronic microvascular ischemic change, advanced for age, stable from prior head CT. 3. Small left frontal scalp contusion.  CERVICAL CT  1. No fracture or acute finding.   Electronically Signed   By: Lajean Manes M.D.   On: 04/06/2019 20:46 Vitals: BP 136/84 (BP Location: Left Arm, Patient Position: Standing)    Pulse 98    Temp 98.2 F (36.8 C)    Ht 5\' 4"  (1.626 m)    Wt 154 lb (69.9 kg)    LMP  (LMP Unknown)    BMI 26.43 kg/m  Last Weight:  Wt Readings from Last 1 Encounters:  05/28/19 154 lb (69.9 kg)   Last Height:   Ht Readings from Last 1 Encounters:  05/28/19 5\' 4"  (1.626 m)    Physical exam:  My nurse performed orthostatic blood pressure measures Ms. Mrs. Mendel Ryder, in supine position blood pressure  was 159/86 mmHg with a heart rate of 79, described as regular.  As the patient went up to sit she stated that she had lightheadedness with a change of posture and position.  Now her blood pressure was 145/85 and her heart rate was 85 bpm.  She was then asked to stand up and it was noted that she leaned back but she did not report feeling lightheaded at all her blood pressure was 136/84 with a heart rate of 98.  This is a quite considerable blood pressure drop over 20 mmHg systolic with similar blood pressure range for the diastolic pressure.  General: The patient is awake, alert and appears not in acute distress. The patient is well groomed. She is muscular. Head: Normocephalic, atraumatic.  Neck is supple. Mallampati 3, neck circumference:13. 75"  Cardiovascular:  Regular rate and rhythm, without  murmurs or carotid bruit, and without distended neck veins. Raspy voice.  Respiratory: Lungs are clear to auscultation. Skin:  Without evidence of edema, or rash Trunk: BMI is 26 -patient  has normal posture.  Neurologic exam : The patient is awake and alert, oriented to place and time.   Memory subjective  described as impaired, with episodic lapses and confusion.   There is a reduced attention span & concentration ability.  Speech is fluent without dysarthria  or aphasia.  There is dysphonia. She claims this from GERD>  Mood and affect are appropriate.  Cranial nerves: Pupils are equal and briskly reactive to light. Funduscopic exam differed  -Extraocular movements  in vertical and horizontal planes intact and without nystagmus.  Visual fields by finger perimetry are intact. Hearing to finger rub intact.  Facial sensation intact to fine touch. Facial motor strength is symmetric and tongue and uvula move midline. Tongue protrusion into either cheek is normal. Shoulder shrug is normal.   Motor exam:   Normal tone ,muscle bulk and symmetric  strength in all extremities.  Sensory:  Fine touch,  pinprick and vibration were  normal. Coordination: Rapid alternating movements in the fingers/hands were normal. Finger-to-nose maneuver  normal without evidence of ataxia, dysmetria or tremor.  Gait and station: Patient walks without assistive device and is able to walk unassisted  Strength within normal limits.  Right leg limping, external rotated , knee pain evidently , turning with 3 step to her left she had to catch herself from falling and leaned to the right side.  Stance is of normal base . Tandem gait is not possible. Romberg testing is negative .  Deep tendon reflexes: in the  upper and lower extremities are symmetric and intact.  I was unable to elicit a patella reflex on the right knee , which hurts her. Babinski maneuver response is  Downgoing.  Prolonged visit due to poor historian, baseline confusion.    Assessment:  After physical and neurologic examination, review of laboratory studies, imaging, neurophysiology testing and pre-existing records, assessment is that of :   Orthostatic BP drop from 1) supine to seated ,and 2)seated to standing position . Light headed only with the first step.  Herat rate compensated well.   Memory loss " since I fell " - not sure if there is a possible concussion / contusion. CT documented contusion of the scalp.  She has microvascular changes, risk factors of HTN and DM. No family history of dementia.    Gait instability is orthopedic/ knee related.   Plan:  Treatment plan and additional workup :  Recommend an MRI brain, non contrast due to CKD 3. Patient has insulin pump- does that exclude her ?   Also MMSE/ MOCA testing with NP.  Consider a long term cardiac monitor for spells of fainting. Dr Skeet Latch.   Continue HTN and DM control, exercise and I am glad your cousin lives now with you and can drive you.     Asencion Partridge Cartina Brousseau MD 05/28/2019

## 2019-05-28 NOTE — Telephone Encounter (Signed)
LVM for patient to call and schedule office visit with Dr. Oval Linsey.

## 2019-06-01 ENCOUNTER — Telehealth: Payer: Self-pay | Admitting: Neurology

## 2019-06-01 NOTE — Telephone Encounter (Signed)
[employer] medicare/medicaid order sent to [facility]. No auth they will reach out to the patient to schedule.  °

## 2019-06-11 ENCOUNTER — Other Ambulatory Visit: Payer: Medicare Other

## 2019-06-12 ENCOUNTER — Telehealth: Payer: Self-pay

## 2019-06-15 ENCOUNTER — Ambulatory Visit (INDEPENDENT_AMBULATORY_CARE_PROVIDER_SITE_OTHER): Payer: Medicare Other | Admitting: Pharmacist

## 2019-06-15 ENCOUNTER — Other Ambulatory Visit: Payer: Self-pay

## 2019-06-15 DIAGNOSIS — E1165 Type 2 diabetes mellitus with hyperglycemia: Secondary | ICD-10-CM | POA: Diagnosis not present

## 2019-06-15 DIAGNOSIS — I10 Essential (primary) hypertension: Secondary | ICD-10-CM

## 2019-06-15 MED ORDER — SPIRONOLACTONE 50 MG PO TABS: 50.0000 mg | ORAL_TABLET | Freq: Every day | ORAL | 0 refills | Status: DC

## 2019-06-15 MED ORDER — VITAMIN D (ERGOCALCIFEROL) 1.25 MG (50000 UNIT) PO CAPS: ORAL_CAPSULE | ORAL | 0 refills | Status: DC

## 2019-06-15 MED ORDER — GABAPENTIN 300 MG PO CAPS: 300.0000 mg | ORAL_CAPSULE | Freq: Three times a day (TID) | ORAL | 3 refills | Status: DC

## 2019-06-15 MED ORDER — PRAVASTATIN SODIUM 80 MG PO TABS: 40.0000 mg | ORAL_TABLET | Freq: Every day | ORAL | 2 refills | Status: DC

## 2019-06-15 MED ORDER — OZEMPIC (1 MG/DOSE) 2 MG/1.5ML ~~LOC~~ SOPN: 1.0000 mg | PEN_INJECTOR | SUBCUTANEOUS | 3 refills | Status: DC

## 2019-06-15 MED ORDER — AMLODIPINE BESYLATE 10 MG PO TABS: 10.0000 mg | ORAL_TABLET | Freq: Every day | ORAL | 0 refills | Status: DC

## 2019-06-18 NOTE — Progress Notes (Signed)
Chronic Care Management   Visit Note  06/15/2019 Name: [name] MRN: [id number] DOB: [birth year]  Referred by: [name], MD Reason for referral : Chronic Care Management   [name] is a 68 y.o. year old female who is a primary care patient of [name], MD. The CCM team was consulted for assistance with chronic disease management and care coordination needs related to HLD and DMII  Review of patient status, including review of consultants reports, relevant laboratory and other test results, and collaboration with appropriate care team members and the patient's provider was performed as part of comprehensive patient evaluation and provision of chronic care management services.    I spoke with [name] by telephone today.  Advanced Directives Status: N See Care Plan and [facility]yn[facility] application for related entries.   Medications: Outpatient Encounter Medications as of 06/15/2019  Medication Sig Note  . albuterol (PROAIR HFA) 108 (90 Base) MCG/ACT inhaler 2 puffs every 4 hours as needed only  if your can't catch your breath (Patient taking differently: Inhale 1-2 puffs into the lungs every 4 (four) hours as needed for wheezing or shortness of breath. )   . amLODipine (NORVASC) 10 MG tablet Take 1 tablet (10 mg total) by mouth at bedtime.   . Continuous Blood Gluc Receiver (FREESTYLE LIBRE READER) DEVI by Does not apply route. CGM device-Freestyle Libre   . Continuous Blood Gluc Sensor (FREESTYLE LIBRE 14 DAY SENSOR) MISC by Does not apply route. CGM 14-day sensors; send refills to [facility] (in [place])   . docusate sodium (COLACE) 100 MG capsule Take 100 mg by mouth 2 (two) times daily as needed for mild constipation.   . gabapentin (NEURONTIN) 300 MG capsule Take 1 capsule (300 mg total) by mouth 3 (three) times daily.   . insulin lispro (HUMALOG) 100 UNIT/ML injection Inject 0.25-0.35 mLs (25-35 Units total) into the skin See admin instructions. Inject 25  units subcutaneously before breakfast and supper, inject 35 units before lunch   . Lancets Misc. MISC 1 each by Does not apply route 4 (four) times daily -  with meals and at bedtime.   [name] losartan (COZAAR) 50 MG tablet Take 1 tablet (50 mg total) by mouth daily.   . mirabegron ER (MYRBETRIQ) 50 MG TB24 tablet Take 1 tablet (50 mg total) by mouth daily.   . Multiple Vitamin (MULTIVITAMIN WITH MINERALS) TABS tablet Take 1 tablet by mouth daily.   . naproxen sodium (ALEVE) 220 MG tablet Take 220 mg by mouth 2 (two) times daily as needed (pain).   . Nebivolol HCl (BYSTOLIC) 20 MG TABS Take 1 tablet (20 mg total) by mouth daily.   . nitrofurantoin (MACRODANTIN) 100 MG capsule Take 100 mg by mouth daily. 04/06/2019: continuous  . pantoprazole (PROTONIX) 40 MG tablet Take 30- 60 min before your first and last meals of the day (Patient taking differently: Take 40 mg by mouth daily before breakfast. )   . pravastatin (PRAVACHOL) 80 MG tablet Take 0.5 tablets (40 mg total) by mouth daily.   . Semaglutide, 1 MG/DOSE, (OZEMPIC, 1 MG/DOSE,) 2 MG/1.5ML SOPN Inject 1 mg into the skin every Thursday.   [name] spironolactone (ALDACTONE) 50 MG tablet Take 1 tablet (50 mg total) by mouth daily.   . Vitamin D, Ergocalciferol, (DRISDOL) 1.25 MG (50000 UT) CAPS capsule TAKE 1 CAPSULE BY MOUTH EVERY 7 DAYS ON WEDNESDAY   . [DISCONTINUED] amLODipine (NORVASC) 10 MG tablet Take 1 tablet (10 mg total) by mouth at  bedtime.   . [DISCONTINUED] gabapentin (NEURONTIN) 300 MG capsule Take 1 capsule (300 mg total) by mouth 3 (three) times daily.   . [DISCONTINUED] OZEMPIC, 1 MG/DOSE, 2 MG/1.5ML SOPN INJECT 1 MG INTO THE SKIN ONCE A WEEK (Patient taking differently: Inject 1 mg into the skin every Thursday. )   . [DISCONTINUED] pravastatin (PRAVACHOL) 80 MG tablet Take 0.5 tablets (40 mg total) by mouth daily.   . [DISCONTINUED] spironolactone (ALDACTONE) 50 MG tablet TAKE 1 TABLET(50 MG) BY MOUTH DAILY (Patient taking differently: Take  50 mg by mouth daily. )   . [DISCONTINUED] Vitamin D, Ergocalciferol, (DRISDOL) 1.25 MG (50000 UT) CAPS capsule TAKE 1 CAPSULE BY MOUTH EVERY 7 DAYS ON WEDNESDAY (Patient taking differently: Take 50,000 Units by mouth every Thursday. )    No facility-administered encounter medications on file as of 06/15/2019.      Objective:   Goals Addressed            This Visit's Progress     Patient Stated   . I want to control my diabetes (pt-stated)       Current Barriers:  [name] Knowledge Deficits related to diabetes diet & lifestyle modifications . Non Adherence to prescribed medication regimen  Pharmacist Clinical Goal(s):  [name] Over the next 90 days, patient will work with PharmD and PCP to address needs related to optimized medication management of chronic conditions  Interventions: . Comprehensive medication review performed.  Reviewed medication fill history via insurance claims data. . Reviewed & discussed the following diabetes-related information with patient: o Encouraged patient to continue checking blood sugars as directed-->patient now has scanner glucometer (FreeStyle [place]), will be able to download data at PCP office during visits.  o Confirmed current DM regimen: Humalog 25u breakfast, 35u lunch, 25u dinner; Ozempic 1mg  weekly.  Patient reports she is content with this regimen and is tolerating it well. o Reviewed medication purpose/side effects-->patient denies adverse events, denies hypoglycemia, reports FBG<150.  Denies BG>200.  She has enjoyed scanning her arm for BG vs. Lancet method.   o Continue to follow ADA recommended "diabetes-friendly" diet  (reviewed healthy snack/food options).  Counseled patient to avoid sugary drinks and foods.  She is working to decrease potato & carbohydrates.  She is also able to walk more because she has had her bladder procedure.  Her bladder condition is improving. o Most recent A1c is 8.7 on 03/19/19 (decrease from 12.0 in March 2020) o  Anti-hypertensive regimen: amlodipine, losartan (no fill data) o Anti-hyperlipidemic regimen: pravastatin - Discussed the benefits of taking statin in diabetes.  Patient states she will try pravastatin daily as she has not had statin intolerance in the past.  Refill called into [facility] on 06/15/2019. o Continue taking all medications as prescribed by provider. Reminded patient of PCP appt in November.  Patient Self Care Activities:  . Self administers medications as prescribed . Attends all scheduled provider appointments . Calls pharmacy for medication refills . Performs ADL's independently . Calls provider office for new concerns or questions  Please see past updates related to this goal by clicking on the "Past Updates" button in the selected goal         Plan:   The care management team will reach out to the patient again over the next 6-8 weeks days.   Provider Signature

## 2019-06-18 NOTE — Patient Instructions (Signed)
Visit Information  Goals Addressed            This Visit's Progress     Patient Stated   . I want to control my diabetes (pt-stated)       Current Barriers:  [name] Knowledge Deficits related to diabetes diet & lifestyle modifications . Non Adherence to prescribed medication regimen  Pharmacist Clinical Goal(s):  [name] Over the next 90 days, patient will work with PharmD and PCP to address needs related to optimized medication management of chronic conditions  Interventions: . Comprehensive medication review performed.  Reviewed medication fill history via insurance claims data. . Reviewed & discussed the following diabetes-related information with patient: o Encouraged patient to continue checking blood sugars as directed-->patient now has scanner glucometer (FreeStyle [place]), will be able to download data at PCP office during visits.  o Confirmed current DM regimen: Humalog 25u breakfast, 35u lunch, 25u dinner; Ozempic 1mg  weekly.  Patient reports she is content with this regimen and is tolerating it well. o Reviewed medication purpose/side effects-->patient denies adverse events, denies hypoglycemia, reports FBG<150.  Denies BG>200.  She has enjoyed scanning her arm for BG vs. Lancet method.   o Continue to follow ADA recommended "diabetes-friendly" diet  (reviewed healthy snack/food options).  Counseled patient to avoid sugary drinks and foods.  She is working to decrease potato & carbohydrates.  She is also able to walk more because she has had her bladder procedure.  Her bladder condition is improving. o Most recent A1c is 8.7 on 03/19/19 (decrease from 12.0 in March 2020) o Anti-hypertensive regimen: amlodipine, losartan (no fill data) o Anti-hyperlipidemic regimen: pravastatin - Discussed the benefits of taking statin in diabetes.  Patient states she will try pravastatin daily as she has not had statin intolerance in the past.  Refill called into [facility] on 06/15/2019. o Continue taking  all medications as prescribed by provider. Reminded patient of PCP appt in November.  Patient Self Care Activities:  . Self administers medications as prescribed . Attends all scheduled provider appointments . Calls pharmacy for medication refills . Performs ADL's independently . Calls provider office for new concerns or questions  Please see past updates related to this goal by clicking on the "Past Updates" button in the selected goal         The patient verbalized understanding of instructions provided today and declined a print copy of patient instruction materials.   The care management team will reach out to the patient again over the next 6-8 weeks.  SIGNATURE Regina Eck, PharmD, BCPS Clinical Pharmacist, [facility] [place]: [phone]

## 2019-06-20 ENCOUNTER — Ambulatory Visit
Admission: RE | Admit: 2019-06-20 | Discharge: 2019-06-20 | Disposition: A | Discharge status: Home / Self Care | Payer: Medicare Other | Source: Ambulatory Visit | Attending: Neurology | Admitting: Neurology

## 2019-06-20 ENCOUNTER — Other Ambulatory Visit: Payer: Self-pay

## 2019-06-20 DIAGNOSIS — R55 Syncope and collapse: Secondary | ICD-10-CM

## 2019-06-20 DIAGNOSIS — Z9641 Presence of insulin pump (external) (internal): Secondary | ICD-10-CM

## 2019-06-20 DIAGNOSIS — M1712 Unilateral primary osteoarthritis, left knee: Secondary | ICD-10-CM

## 2019-06-20 DIAGNOSIS — E1165 Type 2 diabetes mellitus with hyperglycemia: Secondary | ICD-10-CM

## 2019-06-20 DIAGNOSIS — R7989 Other specified abnormal findings of blood chemistry: Secondary | ICD-10-CM

## 2019-06-20 DIAGNOSIS — E118 Type 2 diabetes mellitus with unspecified complications: Secondary | ICD-10-CM | POA: Diagnosis not present

## 2019-06-20 DIAGNOSIS — I951 Orthostatic hypotension: Secondary | ICD-10-CM

## 2019-06-20 DIAGNOSIS — N1832 Chronic kidney disease, stage 3b: Secondary | ICD-10-CM

## 2019-06-20 DIAGNOSIS — Z794 Long term (current) use of insulin: Secondary | ICD-10-CM | POA: Diagnosis not present

## 2019-06-22 ENCOUNTER — Telehealth: Payer: Self-pay

## 2019-06-22 NOTE — Telephone Encounter (Signed)
I was calling the pt for her MRI results. PT ask did she need to schedule a follow up appt at our office. I reviewed the chart and saw per note pt is to follow up with NP for MMSE, MOCHA test for memory. No testing for memory was done at last visit per note.WIll send to NP for review if appt is needed.

## 2019-06-22 NOTE — Telephone Encounter (Signed)
I called pt about her MR brain results. I stated no acute findings. 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 It comes from aging more common in pts with high blood pressure, DM, migraines and hyperlipidemia. Controlling these factors contribute to brain health. PT verbalized understanding.  ------

## 2019-06-22 NOTE — Telephone Encounter (Signed)
If the patient is still having trouble with her memory then yes she can schedule her an appointment.

## 2019-06-22 NOTE — Telephone Encounter (Signed)
I called pt to schedule her with Jinny Blossom NP for follow up based on NP schedule. Pt schedule for 09/28/2019 at 1115am and check in at 1045am.Pt verbalized understanding.

## 2019-06-25 ENCOUNTER — Telehealth: Payer: Self-pay

## 2019-06-30 ENCOUNTER — Ambulatory Visit: Payer: Medicare Other | Admitting: Internal Medicine

## 2019-07-10 ENCOUNTER — Ambulatory Visit
Admission: RE | Admit: 2019-07-10 | Discharge: 2019-07-10 | Disposition: A | Discharge status: Home / Self Care | Payer: Medicare Other | Source: Ambulatory Visit | Attending: Internal Medicine | Admitting: Internal Medicine

## 2019-07-10 ENCOUNTER — Other Ambulatory Visit: Payer: Self-pay

## 2019-07-10 DIAGNOSIS — Z1231 Encounter for screening mammogram for malignant neoplasm of breast: Secondary | ICD-10-CM

## 2019-07-10 DIAGNOSIS — Z780 Asymptomatic menopausal state: Secondary | ICD-10-CM | POA: Diagnosis not present

## 2019-07-10 DIAGNOSIS — E2839 Other primary ovarian failure: Secondary | ICD-10-CM

## 2019-07-17 ENCOUNTER — Telehealth: Payer: Self-pay

## 2019-07-17 ENCOUNTER — Ambulatory Visit: Payer: Self-pay

## 2019-07-17 ENCOUNTER — Other Ambulatory Visit: Payer: Self-pay

## 2019-07-17 DIAGNOSIS — I10 Essential (primary) hypertension: Secondary | ICD-10-CM

## 2019-07-17 DIAGNOSIS — E785 Hyperlipidemia, unspecified: Secondary | ICD-10-CM

## 2019-07-17 DIAGNOSIS — E1165 Type 2 diabetes mellitus with hyperglycemia: Secondary | ICD-10-CM

## 2019-07-20 NOTE — Chronic Care Management (AMB) (Signed)
Chronic Care Management   Follow Up Note   07/17/2019 Name: [name] MRN: [id number] DOB: 1950-09-24  Referred by: Gwendolyn Chard, MD Reason for referral : Chronic Care Management (CCM RNCM Telephone Follow up )   [name] is a 68 y.o. year old female who is a primary care patient of Gwendolyn Chard, MD. The CCM team was consulted for assistance with chronic disease management and care coordination needs.    Review of patient status, including review of consultants reports, relevant laboratory and other test results, and collaboration with appropriate care team members and the patient's provider was performed as part of comprehensive patient evaluation and provision of chronic care management services.    SDOH (Social Determinants of Health) screening performed today: None. See Care Plan for related entries.   Outbound CCM RN CM call placed to patient to follow up on her Diabetes chronic care management.   Outpatient Encounter Medications as of 07/17/2019  Medication Sig Note  . albuterol (PROAIR HFA) 108 (90 Base) MCG/ACT inhaler 2 puffs every 4 hours as needed only  if your can't catch your breath (Patient taking differently: Inhale 1-2 puffs into the lungs every 4 (four) hours as needed for wheezing or shortness of breath. )   . amLODipine (NORVASC) 10 MG tablet Take 1 tablet (10 mg total) by mouth at bedtime.   . Continuous Blood Gluc Receiver (FREESTYLE LIBRE READER) DEVI by Does not apply route. CGM device-Freestyle Libre   . Continuous Blood Gluc Sensor (FREESTYLE LIBRE 14 DAY SENSOR) MISC by Does not apply route. CGM 14-day sensors; send refills to [facility] (in [place])   . docusate sodium (COLACE) 100 MG capsule Take 100 mg by mouth 2 (two) times daily as needed for mild constipation.   . gabapentin (NEURONTIN) 300 MG capsule Take 1 capsule (300 mg total) by mouth 3 (three) times daily.   . insulin lispro (HUMALOG) 100 UNIT/ML injection Inject 0.25-0.35  mLs (25-35 Units total) into the skin See admin instructions. Inject 25 units subcutaneously before breakfast and supper, inject 35 units before lunch   . Lancets Misc. MISC 1 each by Does not apply route 4 (four) times daily -  with meals and at bedtime.   [name] losartan (COZAAR) 50 MG tablet Take 1 tablet (50 mg total) by mouth daily.   . mirabegron ER (MYRBETRIQ) 50 MG TB24 tablet Take 1 tablet (50 mg total) by mouth daily.   . Multiple Vitamin (MULTIVITAMIN WITH MINERALS) TABS tablet Take 1 tablet by mouth daily.   . naproxen sodium (ALEVE) 220 MG tablet Take 220 mg by mouth 2 (two) times daily as needed (pain).   . Nebivolol HCl (BYSTOLIC) 20 MG TABS Take 1 tablet (20 mg total) by mouth daily.   . nitrofurantoin (MACRODANTIN) 100 MG capsule Take 100 mg by mouth daily. 04/06/2019: continuous  . pantoprazole (PROTONIX) 40 MG tablet Take 30- 60 min before your first and last meals of the day (Patient taking differently: Take 40 mg by mouth daily before breakfast. )   . pravastatin (PRAVACHOL) 80 MG tablet Take 0.5 tablets (40 mg total) by mouth daily.   . Semaglutide, 1 MG/DOSE, (OZEMPIC, 1 MG/DOSE,) 2 MG/1.5ML SOPN Inject 1 mg into the skin every Thursday.   [name] spironolactone (ALDACTONE) 50 MG tablet Take 1 tablet (50 mg total) by mouth daily.   . Vitamin D, Ergocalciferol, (DRISDOL) 1.25 MG (50000 UT) CAPS capsule TAKE 1 CAPSULE BY MOUTH EVERY 7 DAYS ON [facility]  No facility-administered encounter medications on file as of 07/17/2019.      Goals Addressed      Patient Stated   . "I am having problems swallowing" (pt-stated)       Current Barriers:  [name] Knowledge Deficits related to diagnosis and treatment management of Impaired swallowing   Nurse Case Manager Clinical Goal(s):  [name] Over the next 30 days, patient will verbalize understanding of plan for evaluation and treatment of Impaired swallowing . Over the next 60 days, patient will not experience any complications secondary to having  Impaired swallowing such as aspiration and or weight loss/dehydration   CCM RN CM Interventions:  07/17/19 call completed with patient   . Evaluation of current treatment plan related to Impaired Swallowing and patient's adherence to plan as established by provider. . Advised patient to report her symptoms of impaired swallowing to the PCP for further evaluation and treatment; discussed for past diagnosis and treatment for this condition; patient reports this has been a chronic problem but has been stable over the past couple of years; discussed this condition has required esophageal dilation in the past . Provided education to patient re: risk for aspiration, weight loss, malnutrition and or dehydration due to impaired swallowing  . Reviewed medications with patient and discussed patient is adhering to taking Pantoprazole 40 mg bid for GERD  . Discussed plans with patient for ongoing care management follow up and provided patient with direct contact information for care management team . Reviewed scheduled/upcoming provider appointments including: PCP provider Gwendolyn Brine, FNP for evaluation of impaired swallowing scheduled for 07/22/19 _0 :50 PM   Patient Self Care Activities:  . Self administers medications as prescribed . Attends all scheduled provider appointments . Calls pharmacy for medication refills . Performs ADL's independently . Performs IADL's independently . Calls provider office for new concerns or questions  Initial goal documentation     . COMPLETED: "I have reoccuring UTI's" (pt-stated)       Current Barriers:  [name] Knowledge Deficits related to Urinary dysfunction and reoccurring UTI's  Nurse Case Manager Clinical Goal(s):  Over the next 60 days, patient will not experience any ED visits or hospital admissions related to infection. Goal not met - patient reports 1 ED visit on 03/06/19 related to urinary dysfunction Over the next 30 days, patient will report improved  urinary function w/o s/s of UTI and will know the plan for ongoing health maintenance related to her Urinary function. Goal Met  CCM RN CM Interventions:  07/17/19 call completed with patient   . Evaluation of current treatment plan related to Urinary dysfunction and patient's adherence to plan as established by provider. . Advised patient to keep all scheduled Urology appointments and to report s/s of UTI promptly to avoid serious infection or urosepsis from UTI; discussed currently patient's Urinary dysfunction is stable and continues to be monitored by Urology    . Discussed plans with patient for ongoing care management follow up and provided patient with direct contact information for care management team   Patient Self Care Activities:  . Self administers medications as prescribed . Attends all scheduled provider appointments . Calls pharmacy for medication refills . Performs ADL's independently . Performs IADL's independently . Calls provider office for new concerns or questions  Please see past updates related to this goal by clicking on the "Past Updates" button in the selected goal       . I want to control my diabetes (pt-stated)  Current Barriers:  [name] Knowledge Deficits related to diabetes diet & lifestyle modifications . Non Adherence to prescribed medication regimen  Pharmacist Clinical Goal(s):  [name] Over the next 90 days, patient will work with PharmD and PCP to address needs related to optimized medication management of chronic conditions  Goal Met . New - 07/17/19 - Over the next 60 days, patient will experience daily glycemic control to be within target range of 80-130  . New - 07/17/19 - Over the next 90 days, patient will lower her A1C <8.7  Interventions: . Comprehensive medication review performed.  Reviewed medication fill history via insurance claims data. . Reviewed & discussed the following diabetes-related information with patient: o Encouraged patient  to continue checking blood sugars as directed-->patient now has scanner glucometer (FreeStyle [place]), will be able to download data at PCP office during visits.  o Confirmed current DM regimen: Humalog 25u breakfast, 35u lunch, 25u dinner; Ozempic 31m weekly.  Patient reports she is content with this regimen and is tolerating it well. o Reviewed medication purpose/side effects-->patient denies adverse events, denies hypoglycemia, reports FBG<150.  Denies BG>200.  She has enjoyed scanning her arm for BG vs. Lancet method.   o Continue to follow ADA recommended "diabetes-friendly" diet  (reviewed healthy snack/food options).  Counseled patient to avoid sugary drinks and foods.  She is working to decrease potato & carbohydrates.  She is also able to walk more because she has had her bladder procedure.  Her bladder condition is improving. o Most recent A1c is 8.7 on 03/19/19 (decrease from 12.0 in March 2020) o Anti-hypertensive regimen: amlodipine, losartan (no fill data) o Anti-hyperlipidemic regimen: pravastatin - Discussed the benefits of taking statin in diabetes.  Patient states she will try pravastatin daily as she has not had statin intolerance in the past.  [facility]. o Continue taking all medications as prescribed by provider. Reminded patient of PCP appt in November.  CCM RN CM Interventions: 07/17/19 call completed with patient   . Evaluation of current treatment plan related to Diabetes and patient's adherence to plan as established by provider. . Provided education to patient re: current A1C 8.7 obtained on 03/19/19; discussed target A1C <7.0; Reviewed Self health measures taken by patient including following an ADA diet, and taking her medications as prescribed; patient states she is trying to stay active but does not have a daily structured exercise routine at this time due to experiencing several ongoing health related issues; discussed these conditions are  being monitored by MD accordingly . Reviewed medications with patient and discussed patient is tolerating Humalog 25u breakfast, 35u lunch, 25u dinner, Ozempic 1 mg weekly. Reviewed adherence to taking Statin for hyperlipidemic control and antihypertensive control with Amlodipine and Losartan.  . Discussed plans with patient for ongoing care management follow up and provided patient with direct contact information for care management team . Provided patient with printed educational materials related to Diabetes Management Zone Safety Tool . Advised patient, providing education and rationale, to check cbg before meals daily and record, calling the CCM team and or PCP for findings outside established parameters.  Discussed patient continues to use her Libre freestyle scanner system and feels she is better able to manage her DM with this meter in place   Patient Self Care Activities:  . Self administers medications as prescribed . Attends all scheduled provider appointments . Calls pharmacy for medication refills . Performs ADL's independently . Calls provider office for new concerns or questions  Please see past updates related to this goal by clicking on the "Past Updates" button in the selected goal          Telephone follow up appointment with care management team member scheduled for: 08/31/19  [name], RN, BSN, CCM Care Management Coordinator [place]  Direct Phone: [phone]

## 2019-07-20 NOTE — Patient Instructions (Signed)
Visit Information  Goals Addressed      Patient Stated   . "I am having problems swallowing" (pt-stated)       Current Barriers:  [name] Knowledge Deficits related to diagnosis and treatment management of Impaired swallowing   Nurse Case Manager Clinical Goal(s):  [name] Over the next 30 days, patient will verbalize understanding of plan for evaluation and treatment of Impaired swallowing . Over the next 60 days, patient will not experience any complications secondary to having Impaired swallowing such as aspiration and or weight loss/dehydration   CCM RN CM Interventions:  07/17/19 call completed with patient   . Evaluation of current treatment plan related to Impaired Swallowing and patient's adherence to plan as established by provider. . Advised patient to report her symptoms of impaired swallowing to the PCP for further evaluation and treatment; discussed for past diagnosis and treatment for this condition; patient reports this has been a chronic problem but has been stable over the past couple of years; discussed this condition has required esophageal dilation in the past . Provided education to patient re: risk for aspiration, weight loss, malnutrition and or dehydration due to impaired swallowing  . Reviewed medications with patient and discussed patient is adhering to taking Pantoprazole 40 mg bid for GERD  . Discussed plans with patient for ongoing care management follow up and provided patient with direct contact information for care management team . Reviewed scheduled/upcoming provider appointments including: PCP provider [name], FNP for evaluation of impaired swallowing scheduled for 07/22/19 @1 :50 PM   Patient Self Care Activities:  . Self administers medications as prescribed . Attends all scheduled provider appointments . Calls pharmacy for medication refills . Performs ADL's independently . Performs IADL's independently . Calls provider office for new concerns or  questions  Initial goal documentation     . COMPLETED: "I have reoccuring UTI's" (pt-stated)       Current Barriers:  [name] Knowledge Deficits related to Urinary dysfunction and reoccurring UTI's  Nurse Case Manager Clinical Goal(s):  Over the next 60 days, patient will not experience any ED visits or hospital admissions related to infection. Goal not met - patient reports 1 ED visit on 03/06/19 related to urinary dysfunction Over the next 30 days, patient will report improved urinary function w/o s/s of UTI and will know the plan for ongoing health maintenance related to her Urinary function. Goal Met  CCM RN CM Interventions:  07/17/19 call completed with patient   . Evaluation of current treatment plan related to Urinary dysfunction and patient's adherence to plan as established by provider. . Advised patient to keep all scheduled Urology appointments and to report s/s of UTI promptly to avoid serious infection or urosepsis from UTI; discussed currently patient's Urinary dysfunction is stable and continues to be monitored by Urology    . Discussed plans with patient for ongoing care management follow up and provided patient with direct contact information for care management team   Patient Self Care Activities:  . Self administers medications as prescribed . Attends all scheduled provider appointments . Calls pharmacy for medication refills . Performs ADL's independently . Performs IADL's independently . Calls provider office for new concerns or questions  Please see past updates related to this goal by clicking on the "Past Updates" button in the selected goal       . I want to control my diabetes (pt-stated)       Current Barriers:  [name] Knowledge Deficits related to diabetes diet & lifestyle  modifications . Non Adherence to prescribed medication regimen  Pharmacist Clinical Goal(s):  [name] Over the next 90 days, patient will work with PharmD and PCP to address needs related to  optimized medication management of chronic conditions  Goal Met . New - 07/17/19 - Over the next 60 days, patient will experience daily glycemic control to be within target range of 80-130  . New - 07/17/19 - Over the next 90 days, patient will lower her A1C <8.7  Interventions: . Comprehensive medication review performed.  Reviewed medication fill history via insurance claims data. . Reviewed & discussed the following diabetes-related information with patient: o Encouraged patient to continue checking blood sugars as directed-->patient now has scanner glucometer (FreeStyle [place]), will be able to download data at PCP office during visits.  o Confirmed current DM regimen: Humalog 25u breakfast, 35u lunch, 25u dinner; Ozempic 76m weekly.  Patient reports she is content with this regimen and is tolerating it well. o Reviewed medication purpose/side effects-->patient denies adverse events, denies hypoglycemia, reports FBG<150.  Denies BG>200.  She has enjoyed scanning her arm for BG vs. Lancet method.   o Continue to follow ADA recommended "diabetes-friendly" diet  (reviewed healthy snack/food options).  Counseled patient to avoid sugary drinks and foods.  She is working to decrease potato & carbohydrates.  She is also able to walk more because she has had her bladder procedure.  Her bladder condition is improving. o Most recent A1c is 8.7 on 03/19/19 (decrease from 12.0 in March 2020) o Anti-hypertensive regimen: amlodipine, losartan (no fill data) o Anti-hyperlipidemic regimen: pravastatin - Discussed the benefits of taking statin in diabetes.  Patient states she will try pravastatin daily as she has not had statin intolerance in the past.  [facility]. o Continue taking all medications as prescribed by provider. Reminded patient of PCP appt in November.  CCM RN CM Interventions: 07/17/19 call completed with patient   . Evaluation of current treatment plan related to  Diabetes and patient's adherence to plan as established by provider. . Provided education to patient re: current A1C 8.7 obtained on 03/19/19; discussed target A1C <7.0; Reviewed Self health measures taken by patient including following an ADA diet, and taking her medications as prescribed; patient states she is trying to stay active but does not have a daily structured exercise routine at this time due to experiencing several ongoing health related issues; discussed these conditions are being monitored by MD accordingly . Reviewed medications with patient and discussed patient is tolerating Humalog 25u breakfast, 35u lunch, 25u dinner, Ozempic 1 mg weekly. Reviewed adherence to taking Statin for hyperlipidemic control and antihypertensive control with Amlodipine and Losartan.  . Discussed plans with patient for ongoing care management follow up and provided patient with direct contact information for care management team . Provided patient with printed educational materials related to Diabetes Management Zone Safety Tool . Advised patient, providing education and rationale, to check cbg before meals daily and record, calling the CCM team and or PCP for findings outside established parameters.  Discussed patient continues to use her Libre freestyle scanner system and feels she is better able to manage her DM with this meter in place   Patient Self Care Activities:  . Self administers medications as prescribed . Attends all scheduled provider appointments . Calls pharmacy for medication refills . Performs ADL's independently . Calls provider office for new concerns or questions  Please see past updates related to this goal by clicking on the "  Past Updates" button in the selected goal         The patient verbalized understanding of instructions provided today and declined a print copy of patient instruction materials.   Telephone follow up appointment with care management team member scheduled  for: 08/31/19  [name], RN, BSN, CCM Care Management Coordinator [place] [facility]  Direct Phone: [phone]

## 2019-07-22 ENCOUNTER — Encounter: Payer: Self-pay | Admitting: Nurse Practitioner

## 2019-07-22 ENCOUNTER — Other Ambulatory Visit: Payer: Self-pay

## 2019-07-22 ENCOUNTER — Ambulatory Visit: Payer: Self-pay

## 2019-07-22 ENCOUNTER — Ambulatory Visit (INDEPENDENT_AMBULATORY_CARE_PROVIDER_SITE_OTHER): Payer: Medicare Other | Admitting: Nurse Practitioner

## 2019-07-22 VITALS — BP 146/88 | HR 68 | Temp 98.2°F | Ht 64.0 in | Wt 155.6 lb

## 2019-07-22 DIAGNOSIS — Z23 Encounter for immunization: Secondary | ICD-10-CM

## 2019-07-22 DIAGNOSIS — E1121 Type 2 diabetes mellitus with diabetic nephropathy: Secondary | ICD-10-CM | POA: Diagnosis not present

## 2019-07-22 DIAGNOSIS — K5900 Constipation, unspecified: Secondary | ICD-10-CM

## 2019-07-22 DIAGNOSIS — E785 Hyperlipidemia, unspecified: Secondary | ICD-10-CM

## 2019-07-22 DIAGNOSIS — I129 Hypertensive chronic kidney disease with stage 1 through stage 4 chronic kidney disease, or unspecified chronic kidney disease: Secondary | ICD-10-CM | POA: Diagnosis not present

## 2019-07-22 DIAGNOSIS — I10 Essential (primary) hypertension: Secondary | ICD-10-CM

## 2019-07-22 DIAGNOSIS — R1310 Dysphagia, unspecified: Secondary | ICD-10-CM

## 2019-07-22 DIAGNOSIS — N1831 Chronic kidney disease, stage 3a: Secondary | ICD-10-CM

## 2019-07-22 DIAGNOSIS — Z794 Long term (current) use of insulin: Secondary | ICD-10-CM

## 2019-07-22 MED ORDER — LUBIPROSTONE 8 MCG PO CAPS: 8.0000 ug | ORAL_CAPSULE | Freq: Every day | ORAL | 1 refills | Status: DC

## 2019-07-22 NOTE — Patient Instructions (Signed)
Visit Information  Goals Addressed      Patient Stated   . I want to control my diabetes (pt-stated)       Current Barriers:  [name] Knowledge Deficits related to diabetes diet & lifestyle modifications . Non Adherence to prescribed medication regimen  Pharmacist Clinical Goal(s):  [name] Over the next 90 days, patient will work with PharmD and PCP to address needs related to optimized medication management of chronic conditions  Goal Met . New - 07/17/19 - Over the next 60 days, patient will experience daily glycemic control to be within target range of 80-130  . New - 07/17/19 - Over the next 90 days, patient will lower her A1C <8.7  Interventions: . Comprehensive medication review performed.  Reviewed medication fill history via insurance claims data. . Reviewed & discussed the following diabetes-related information with patient: o Encouraged patient to continue checking blood sugars as directed-->patient now has scanner glucometer (FreeStyle [place]), will be able to download data at PCP office during visits.  o Confirmed current DM regimen: Humalog 25u breakfast, 35u lunch, 25u dinner; Ozempic 50m weekly.  Patient reports she is content with this regimen and is tolerating it well. o Reviewed medication purpose/side effects-->patient denies adverse events, denies hypoglycemia, reports FBG<150.  Denies BG>200.  She has enjoyed scanning her arm for BG vs. Lancet method.   o Continue to follow ADA recommended "diabetes-friendly" diet  (reviewed healthy snack/food options).  Counseled patient to avoid sugary drinks and foods.  She is working to decrease potato & carbohydrates.  She is also able to walk more because she has had her bladder procedure.  Her bladder condition is improving. o Most recent A1c is 8.7 on 03/19/19 (decrease from 12.0 in March 2020) o Anti-hypertensive regimen: amlodipine, losartan (no fill data) o Anti-hyperlipidemic regimen: pravastatin - Discussed the benefits of taking statin  in diabetes.  Patient states she will try pravastatin daily as she has not had statin intolerance in the past.  Refill called into [facility] on 06/15/2019. Continue taking all medications as prescribed by provider. Reminded patient of PCP appt in November.  CCM RN CM Interventions: 07/22/19 call completed with patient   . Received inbound call from patient stating she has not received her LElenor Legatosupply shipment containing her supply's and replacement arm patch . Discussed the manufacturer is needing clinical documentation from the PCP provider but the patient is not sure what is needed at this time . Discussed Ms. LSeltzerwill use her manual glucometer until her replacement is received . Sent in basket message to embedded Pharm D JLottie Dawsonrequesting she follow up with patient next week to offer assistance . Evaluation of current treatment plan related to Diabetes and patient's adherence to plan as established by provider. . Provided education to patient re: current A1C 8.7 obtained on 03/19/19; discussed target A1C <7.0; Reviewed Self health measures taken by patient including following an ADA diet, and taking her medications as prescribed; patient states she is trying to stay active but does not have a daily structured exercise routine at this time due to experiencing several ongoing health related issues; discussed these conditions are being monitored by MD accordingly . Reviewed medications with patient and discussed patient is tolerating Humalog 25u breakfast, 35u lunch, 25u dinner, Ozempic 1 mg weekly. Reviewed adherence to taking Statin for hyperlipidemic control and antihypertensive control with Amlodipine and Losartan.  . Discussed plans with patient for ongoing care management follow up and provided patient with direct contact information for care  management team . Provided patient with printed educational materials related to Diabetes Management Zone Safety Tool . Advised patient,  providing education and rationale, to check cbg before meals daily and record, calling the CCM team and or PCP for findings outside established parameters.  Discussed patient continues to use her Libre freestyle scanner system and feels she is better able to manage her DM with this meter in place   Patient Self Care Activities:  . Self administers medications as prescribed . Attends all scheduled provider appointments . Calls pharmacy for medication refills . Performs ADL's independently . Calls provider office for new concerns or questions  Please see past updates related to this goal by clicking on the "Past Updates" button in the selected goal         The patient verbalized understanding of instructions provided today and declined a print copy of patient instruction materials.   Telephone follow up appointment with care management team member scheduled for: 07/27/19  [name], RN, BSN, CCM Care Management Coordinator [place] [facility]  Direct Phone: [phone]

## 2019-07-22 NOTE — Progress Notes (Addendum)
Subjective:     Patient ID: [name] , female    DOB: 08-31-1950 , 68 y.o.   MRN: [id number]   Chief Complaint  Patient presents with  . Constipation    and swolling    HPI  Wt Readings from Last 3 Encounters: 07/22/19 : 155 lb 9.6 oz (70.6 kg) 05/28/19 : 154 lb (69.9 kg) 04/14/19 : 155 lb 3.2 oz (70.4 kg)  She has had a cholecystectomy  Constipation This is a new problem. The patient is on a high fiber diet. She does not exercise regularly. There has been adequate water intake (she is drinking 64 oz per day of water). Associated symptoms include bloating and flatus. Pertinent negatives include no diarrhea or nausea. Risk factors include obesity. She has tried stool softeners and laxatives (metamucil) for the symptoms.  Diabetes She presents for her follow-up diabetic visit. She has type 2 diabetes mellitus. Her disease course has been stable. There are no hypoglycemic associated symptoms. Pertinent negatives for hypoglycemia include no dizziness. There are no diabetic associated symptoms. Pertinent negatives for diabetes include no chest pain. There are no hypoglycemic complications. There are no diabetic complications. Risk factors for coronary artery disease include sedentary lifestyle. Current diabetic treatment includes oral agent (dual therapy). She is compliant with treatment all of the time. She is following a generally healthy diet. When asked about meal planning, she reported none. She has not had a previous visit with a dietitian. She rarely participates in exercise. Her home blood glucose trend is decreasing steadily. (Blood sugars are averaging 150's) An ACE inhibitor/angiotensin II receptor blocker is being taken. She does not see a podiatrist.Eye exam is not current (December 13th appt.).   Patient continues to use CGM scanner glucometer [facility] [place]) to test blood sugars.  Patient is injecting insulin 3 times daily with meals and injecting GLP1 once weekly.   She is testing her blood sugar 4-6 times a day in order for [place] to adjust her meal time insulin.  Patient is to continue current doses at this time and we will reassess.  Past Medical History:  Diagnosis Date  . Arthritis    L knee, hands, back   . Asthma   . Diabetes mellitus type 2, insulin dependent ([place])   . Fibromyalgia   . Full dentures   . GERD (gastroesophageal reflux disease)   . H/O hiatal hernia   . Headache    h/o migraines - was followed for a time with a wellness doctor  . History of esophageal dilatation   . History of rhabdomyolysis    03/ 2015  . Hyperlipidemia   . Hypertension   . Insulin pump in place    since 12/ 2014--  [employer]  . Osteoarthritis of left knee, primary localized 08/17/2014  . Renal insufficiency   . SUI (stress urinary incontinence, female)   . Wears glasses      Family History  Problem Relation Age of Onset  . Diabetes Mother   . Hypertension Mother   . Lung cancer Mother        smoked  . Diabetes Father   . Hypertension Father   . Lung cancer Father        smoked  . Diabetes Brother   . Heart failure Sister   . Diabetes Maternal Grandmother   . Cancer Maternal Grandmother      Current Outpatient Medications:  .  albuterol (PROAIR HFA) 108 (90 Base) MCG/ACT inhaler, 2 puffs every 4 hours as  needed only  if your can't catch your breath (Patient taking differently: Inhale 1-2 puffs into the lungs every 4 (four) hours as needed for wheezing or shortness of breath. ), Disp: 1 Inhaler, Rfl: 11 .  amLODipine (NORVASC) 10 MG tablet, Take 1 tablet (10 mg total) by mouth at bedtime., Disp: 90 tablet, Rfl: 0 .  Continuous Blood Gluc Receiver (FREESTYLE LIBRE READER) DEVI, by Does not apply route. CGM device-Freestyle [place], Disp: , Rfl:  .  Continuous Blood Gluc Sensor (FREESTYLE LIBRE 14 DAY SENSOR) MISC, by Does not apply route. CGM 14-day sensors; send refills to [facility] (in [place]), Disp: , Rfl:  .  docusate sodium (COLACE) 100  MG capsule, Take 100 mg by mouth 2 (two) times daily as needed for mild constipation., Disp: , Rfl:  .  gabapentin (NEURONTIN) 300 MG capsule, Take 1 capsule (300 mg total) by mouth 3 (three) times daily., Disp: 90 capsule, Rfl: 3 .  insulin lispro (HUMALOG) 100 UNIT/ML injection, Inject 0.25-0.35 mLs (25-35 Units total) into the skin See admin instructions. Inject 25 units subcutaneously before breakfast and supper, inject 35 units before lunch, Disp: 40 mL, Rfl: 2 .  Lancets Misc. MISC, 1 each by Does not apply route 4 (four) times daily -  with meals and at bedtime., Disp: 300 each, Rfl: 3 .  losartan (COZAAR) 50 MG tablet, Take 1 tablet (50 mg total) by mouth daily., Disp: 90 tablet, Rfl: 3 .  mirabegron ER (MYRBETRIQ) 50 MG TB24 tablet, Take 1 tablet (50 mg total) by mouth daily., Disp: 90 tablet, Rfl: 0 .  Multiple Vitamin (MULTIVITAMIN WITH MINERALS) TABS tablet, Take 1 tablet by mouth daily., Disp: , Rfl:  .  naproxen sodium (ALEVE) 220 MG tablet, Take 220 mg by mouth 2 (two) times daily as needed (pain)., Disp: , Rfl:  .  Nebivolol HCl (BYSTOLIC) 20 MG TABS, Take 1 tablet (20 mg total) by mouth daily., Disp: 90 tablet, Rfl: 1 .  nitrofurantoin (MACRODANTIN) 100 MG capsule, Take 100 mg by mouth daily., Disp: , Rfl:  .  pantoprazole (PROTONIX) 40 MG tablet, Take 30- 60 min before your first and last meals of the day (Patient taking differently: Take 40 mg by mouth daily before breakfast. ), Disp: 60 tablet, Rfl: 11 .  pravastatin (PRAVACHOL) 80 MG tablet, Take 0.5 tablets (40 mg total) by mouth daily., Disp: 90 tablet, Rfl: 2 .  Semaglutide, 1 MG/DOSE, (OZEMPIC, 1 MG/DOSE,) 2 MG/1.5ML SOPN, Inject 1 mg into the skin every Thursday., Disp: 3 pen, Rfl: 3 .  spironolactone (ALDACTONE) 50 MG tablet, Take 1 tablet (50 mg total) by mouth daily., Disp: 90 tablet, Rfl: 0 .  Vitamin D, Ergocalciferol, (DRISDOL) 1.25 MG (50000 UT) CAPS capsule, TAKE 1 CAPSULE BY MOUTH EVERY 7 DAYS ON WEDNESDAY, Disp: 5  capsule, Rfl: 0   Allergies  Allergen Reactions  . Pollen Extract Other (See Comments)    Congestion/sneezing  . Tomato Hives and Itching  . Codeine Hives, Itching and Nausea And Vomiting     Review of Systems  Respiratory: Negative.   Cardiovascular: Negative.  Negative for chest pain, palpitations and leg swelling.  Gastrointestinal: Positive for bloating, constipation and flatus. Negative for abdominal distention, diarrhea and nausea.  Neurological: Negative for dizziness.  Psychiatric/Behavioral: Negative.      Today's Vitals   07/22/19 0838  BP: (!) 146/88  Pulse: 68  Temp: 98.2 F (36.8 C)  Weight: 155 lb 9.6 oz (70.6 kg)  Height: 5' 4"  (1.626  m)   Body mass index is 26.71 kg/m.   Objective:  Physical Exam Vitals signs reviewed.  Constitutional:      General: She is not in acute distress.    Appearance: Normal appearance. She is obese.  Cardiovascular:     Rate and Rhythm: Normal rate and regular rhythm.     Pulses: Normal pulses.     Heart sounds: Normal heart sounds. No murmur.  Pulmonary:     Effort: Pulmonary effort is normal. No respiratory distress.     Breath sounds: Normal breath sounds.  Abdominal:     General: Bowel sounds are normal. There is no distension.     Palpations: Abdomen is soft. There is no mass.     Tenderness: There is no abdominal tenderness.  Musculoskeletal:     Right lower leg: No edema.     Left lower leg: No edema.  Skin:    General: Skin is warm and dry.     Capillary Refill: Capillary refill takes less than 2 seconds.  Neurological:     General: No focal deficit present.     Mental Status: She is alert and oriented to person, place, and time.  Psychiatric:        Mood and Affect: Mood normal.        Behavior: Behavior normal.        Thought Content: Thought content normal.        Judgment: Judgment normal.         Assessment And Plan:     1. Dysphagia, unspecified type  She is having to crush her pills  now  She has previously had esophageal stretching   Referral made to Dr. Collene Mares  - Ambulatory referral to Gastroenterology  2. Hyperlipidemia, unspecified hyperlipidemia type  Chronic, controlled  Continue with current medications - CMP14+EGFR - Lipid Profile  3. Type 2 diabetes mellitus with stage 3a chronic kidney disease, with long-term current use of insulin ([place])  Chronic, she feels she is doing better since using [employer]  Patient continues to use CGM scanner glucometer (FreeStyle [place]) to test blood sugars.    Patient is injecting insulin 3 times daily with meals and injecting GLP1 once weekly.    She is testing her blood sugar 4-6 times a day in order for [place] to adjust her meal time insulin.  Patient is to continue current doses at this time and we will reassess.  Will check HgbA1c  Diabetic foot exam done with decreased sensation, educated on regular foot checks, moisturizing feet.  - CMP14+EGFR - Hemoglobin A1c  4. Constipation, unspecified constipation type  Chronic, she is averaging one bowel movement per week, laxatives, stool softners are ineffective  Will start her on amitiza daily  Return in 4-6 weeks for follow up   Continue drinking adequate amounts of water.  - lubiprostone (AMITIZA) 8 MCG capsule; Take 1 capsule (8 mcg total) by mouth daily with breakfast.  Dispense: 90 capsule; Refill: 1  5. Essential hypertension . B/P is controlled.  . CMP ordered to check renal function.  . The importance of regular exercise and dietary modification was stressed to the patient.   - CMP14+EGFR  6. Need for influenza vaccination  Influenza vaccine given in office  Advised to take Tylenol as needed for muscle aches or fever - Flu vaccine HIGH DOSE PF (Fluzone High dose)   [name], FNP    THE PATIENT IS ENCOURAGED TO PRACTICE SOCIAL DISTANCING DUE TO THE COVID-19 PANDEMIC.

## 2019-07-22 NOTE — Chronic Care Management (AMB) (Signed)
Chronic Care Management   Follow Up Note   07/22/2019 Name: [name] MRN: [id number] DOB: Apr 09, 1951  Referred by: Gwendolyn Chard, MD Reason for referral : Chronic Care Management (CCM RNCM Telephone Follow up)   [name] is a 68 y.o. year old female who is a primary care patient of Gwendolyn Chard, MD. The CCM team was consulted for assistance with chronic disease management and care coordination needs.    Review of patient status, including review of consultants reports, relevant laboratory and other test results, and collaboration with appropriate care team members and the patient's provider was performed as part of comprehensive patient evaluation and provision of chronic care management services.    SDOH (Social Determinants of Health) screening performed today: None. See Care Plan for related entries.   Inbound call received from [name] with questions regarding her [name] replacement/supplies.   Outpatient Encounter Medications as of 07/22/2019  Medication Sig Note  . albuterol (PROAIR HFA) 108 (90 Base) MCG/ACT inhaler 2 puffs every 4 hours as needed only  if your can't catch your breath (Patient taking differently: Inhale 1-2 puffs into the lungs every 4 (four) hours as needed for wheezing or shortness of breath. )   . amLODipine (NORVASC) 10 MG tablet Take 1 tablet (10 mg total) by mouth at bedtime.   . Continuous Blood Gluc Receiver (FREESTYLE LIBRE READER) DEVI by Does not apply route. CGM device-Freestyle Libre   . Continuous Blood Gluc Sensor (FREESTYLE LIBRE 14 DAY SENSOR) MISC by Does not apply route. CGM 14-day sensors; send refills to [facility] (in [place])   . docusate sodium (COLACE) 100 MG capsule Take 100 mg by mouth 2 (two) times daily as needed for mild constipation.   . gabapentin (NEURONTIN) 300 MG capsule Take 1 capsule (300 mg total) by mouth 3 (three) times daily.   . insulin lispro (HUMALOG) 100 UNIT/ML injection Inject  0.25-0.35 mLs (25-35 Units total) into the skin See admin instructions. Inject 25 units subcutaneously before breakfast and supper, inject 35 units before lunch   . Lancets Misc. MISC 1 each by Does not apply route 4 (four) times daily -  with meals and at bedtime.   [name] losartan (COZAAR) 50 MG tablet Take 1 tablet (50 mg total) by mouth daily.   [name] lubiprostone (AMITIZA) 8 MCG capsule Take 1 capsule (8 mcg total) by mouth daily with breakfast.   . mirabegron ER (MYRBETRIQ) 50 MG TB24 tablet Take 1 tablet (50 mg total) by mouth daily.   . Multiple Vitamin (MULTIVITAMIN WITH MINERALS) TABS tablet Take 1 tablet by mouth daily.   . naproxen sodium (ALEVE) 220 MG tablet Take 220 mg by mouth 2 (two) times daily as needed (pain).   . Nebivolol HCl (BYSTOLIC) 20 MG TABS Take 1 tablet (20 mg total) by mouth daily.   . nitrofurantoin (MACRODANTIN) 100 MG capsule Take 100 mg by mouth daily. 04/06/2019: continuous  . pantoprazole (PROTONIX) 40 MG tablet Take 30- 60 min before your first and last meals of the day (Patient taking differently: Take 40 mg by mouth daily before breakfast. )   . pravastatin (PRAVACHOL) 80 MG tablet Take 0.5 tablets (40 mg total) by mouth daily.   . Semaglutide, 1 MG/DOSE, (OZEMPIC, 1 MG/DOSE,) 2 MG/1.5ML SOPN Inject 1 mg into the skin every Thursday.   [name] spironolactone (ALDACTONE) 50 MG tablet Take 1 tablet (50 mg total) by mouth daily.   . Vitamin D, Ergocalciferol, (DRISDOL) 1.25 MG (50000 UT)  CAPS capsule TAKE 1 CAPSULE BY MOUTH EVERY 7 DAYS ON WEDNESDAY    No facility-administered encounter medications on file as of 07/22/2019.      Goals Addressed      Patient Stated   . I want to control my diabetes (pt-stated)       Current Barriers:  [name] Knowledge Deficits related to diabetes diet & lifestyle modifications . Non Adherence to prescribed medication regimen  Pharmacist Clinical Goal(s):  [name] Over the next 90 days, patient will work with PharmD and PCP to address needs related  to optimized medication management of chronic conditions  Goal Met . New - 07/17/19 - Over the next 60 days, patient will experience daily glycemic control to be within target range of 80-130  . New - 07/17/19 - Over the next 90 days, patient will lower her A1C <8.7  Interventions: . Comprehensive medication review performed.  Reviewed medication fill history via insurance claims data. . Reviewed & discussed the following diabetes-related information with patient: o Encouraged patient to continue checking blood sugars as directed-->patient now has scanner glucometer (FreeStyle [place]), will be able to download data at PCP office during visits.  o Confirmed current DM regimen: Humalog 25u breakfast, 35u lunch, 25u dinner; Ozempic 62m weekly.  Patient reports she is content with this regimen and is tolerating it well. o Reviewed medication purpose/side effects-->patient denies adverse events, denies hypoglycemia, reports FBG<150.  Denies BG>200.  She has enjoyed scanning her arm for BG vs. Lancet method.   o Continue to follow ADA recommended "diabetes-friendly" diet  (reviewed healthy snack/food options).  Counseled patient to avoid sugary drinks and foods.  She is working to decrease potato & carbohydrates.  She is also able to walk more because she has had her bladder procedure.  Her bladder condition is improving. o Most recent A1c is 8.7 on 03/19/19 (decrease from 12.0 in March 2020) o Anti-hypertensive regimen: amlodipine, losartan (no fill data) o Anti-hyperlipidemic regimen: pravastatin - Discussed the benefits of taking statin in diabetes.  Patient states she will try pravastatin daily as she has not had statin intolerance in the past.  Refill called into [facility] on 06/15/2019. Continue taking all medications as prescribed by provider. Reminded patient of PCP appt in November.  CCM RN CM Interventions: 07/22/19 call completed with patient   . Received inbound call from patient stating she  has not received her LElenor Legatosupply shipment containing her supply's and replacement arm patch . Discussed the manufacturer is needing clinical documentation from the PCP provider but the patient is not sure what is needed at this time . Discussed Ms. LBibbinswill use her manual glucometer until her replacement is received . Sent in basket message to embedded Pharm D JLottie Dawsonrequesting she follow up with patient next week to offer assistance . Evaluation of current treatment plan related to Diabetes and patient's adherence to plan as established by provider. . Provided education to patient re: current A1C 8.7 obtained on 03/19/19; discussed target A1C <7.0; Reviewed Self health measures taken by patient including following an ADA diet, and taking her medications as prescribed; patient states she is trying to stay active but does not have a daily structured exercise routine at this time due to experiencing several ongoing health related issues; discussed these conditions are being monitored by MD accordingly . Reviewed medications with patient and discussed patient is tolerating Humalog 25u breakfast, 35u lunch, 25u dinner, Ozempic 1 mg weekly. Reviewed adherence to taking Statin for hyperlipidemic control and  antihypertensive control with Amlodipine and Losartan.  . Discussed plans with patient for ongoing care management follow up and provided patient with direct contact information for care management team . Provided patient with printed educational materials related to Diabetes Management Zone Safety Tool . Advised patient, providing education and rationale, to check cbg before meals daily and record, calling the CCM team and or PCP for findings outside established parameters.  Discussed patient continues to use her Libre freestyle scanner system and feels she is better able to manage her DM with this meter in place   Patient Self Care Activities:  . Self administers medications as prescribed .  Attends all scheduled provider appointments . Calls pharmacy for medication refills . Performs ADL's independently . Calls provider office for new concerns or questions  Please see past updates related to this goal by clicking on the "Past Updates" button in the selected goal          Telephone follow up appointment with care management team member scheduled for: 07/27/19   [name], RN, BSN, CCM Care Management Coordinator [place] [facility]  Direct Phone: [phone]

## 2019-07-23 LAB — LIPID PANEL
Chol/HDL Ratio: 3.3 ratio (ref 0.0–4.4)
Cholesterol, Total: 186 mg/dL (ref 100–199)
HDL: 56 mg/dL (ref >39)
LDL Chol Calc (NIH): 115 mg/dL — ABNORMAL HIGH (ref 0–99)
Triglycerides: 80 mg/dL (ref 0–149)
VLDL Cholesterol Cal: 15 mg/dL (ref 5–40)

## 2019-07-23 LAB — CMP14+EGFR
ALT: 11 IU/L (ref 0–32)
AST: 17 IU/L (ref 0–40)
Albumin/Globulin Ratio: 1.5 (ref 1.2–2.2)
Albumin: 4.2 g/dL (ref 3.8–4.8)
Alkaline Phosphatase: 112 IU/L (ref 39–117)
BUN/Creatinine Ratio: 12 (ref 12–28)
BUN: 13 mg/dL (ref 8–27)
Bilirubin Total: 0.3 mg/dL (ref 0.0–1.2)
CO2: 25 mmol/L (ref 20–29)
Calcium: 9.7 mg/dL (ref 8.7–10.3)
Chloride: 99 mmol/L (ref 96–106)
Creatinine, Ser: 1.12 mg/dL — ABNORMAL HIGH (ref 0.57–1.00)
GFR calc Af Amer: 59 mL/min/{1.73_m2} — ABNORMAL LOW (ref >59)
GFR calc non Af Amer: 51 mL/min/{1.73_m2} — ABNORMAL LOW (ref >59)
Globulin, Total: 2.8 g/dL (ref 1.5–4.5)
Glucose: 230 mg/dL — ABNORMAL HIGH (ref 65–99)
Potassium: 4.4 mmol/L (ref 3.5–5.2)
Sodium: 139 mmol/L (ref 134–144)
Total Protein: 7 g/dL (ref 6.0–8.5)

## 2019-07-23 LAB — HEMOGLOBIN A1C
Est. average glucose Bld gHb Est-mCnc: 220 mg/dL
Hgb A1c MFr Bld: 9.3 % — ABNORMAL HIGH (ref 4.8–5.6)

## 2019-07-27 ENCOUNTER — Ambulatory Visit (INDEPENDENT_AMBULATORY_CARE_PROVIDER_SITE_OTHER): Payer: Medicare Other | Admitting: Pharmacist

## 2019-07-27 DIAGNOSIS — Z794 Long term (current) use of insulin: Secondary | ICD-10-CM

## 2019-07-27 DIAGNOSIS — I10 Essential (primary) hypertension: Secondary | ICD-10-CM

## 2019-07-27 DIAGNOSIS — E1121 Type 2 diabetes mellitus with diabetic nephropathy: Secondary | ICD-10-CM | POA: Diagnosis not present

## 2019-07-27 DIAGNOSIS — N1831 Chronic kidney disease, stage 3a: Secondary | ICD-10-CM | POA: Diagnosis not present

## 2019-07-28 NOTE — Progress Notes (Signed)
Chronic Care Management  Visit Note  07/27/2019 Name: SADONNA KOTARA MRN: [id number] DOB: 1950/09/08  Referred by: [name], MD Reason for referral : Chronic Care Management   [name] is a 68 y.o. year old female who is a primary care patient of [name], MD. The CCM team was consulted for assistance with chronic disease management and care coordination needs related to DMII  Review of patient status, including review of consultants reports, relevant laboratory and other test results, and collaboration with appropriate care team members and the patient's provider was performed as part of comprehensive patient evaluation and provision of chronic care management services.    I spoke with Ms. Ellwanger by telephone today.  Medications: Outpatient Encounter Medications as of 07/27/2019  Medication Sig Note   albuterol (PROAIR HFA) 108 (90 Base) MCG/ACT inhaler 2 puffs every 4 hours as needed only  if your can't catch your breath (Patient taking differently: Inhale 1-2 puffs into the lungs every 4 (four) hours as needed for wheezing or shortness of breath. )    amLODipine (NORVASC) 10 MG tablet Take 1 tablet (10 mg total) by mouth at bedtime.    Continuous Blood Gluc Receiver (FREESTYLE LIBRE READER) DEVI by Does not apply route. CGM device-Freestyle Libre    Continuous Blood Gluc Sensor (FREESTYLE LIBRE 14 DAY SENSOR) MISC by Does not apply route. CGM 14-day sensors; send refills to [employer] (in [place])    docusate sodium (COLACE) 100 MG capsule Take 100 mg by mouth 2 (two) times daily as needed for mild constipation.    gabapentin (NEURONTIN) 300 MG capsule Take 1 capsule (300 mg total) by mouth 3 (three) times daily.    insulin lispro (HUMALOG) 100 UNIT/ML injection Inject 0.25-0.35 mLs (25-35 Units total) into the skin See admin instructions. Inject 25 units subcutaneously before breakfast and supper, inject 35 units before lunch    Lancets Misc.  MISC 1 each by Does not apply route 4 (four) times daily -  with meals and at bedtime.    losartan (COZAAR) 50 MG tablet Take 1 tablet (50 mg total) by mouth daily.    lubiprostone (AMITIZA) 8 MCG capsule Take 1 capsule (8 mcg total) by mouth daily with breakfast.    mirabegron ER (MYRBETRIQ) 50 MG TB24 tablet Take 1 tablet (50 mg total) by mouth daily.    Multiple Vitamin (MULTIVITAMIN WITH MINERALS) TABS tablet Take 1 tablet by mouth daily.    naproxen sodium (ALEVE) 220 MG tablet Take 220 mg by mouth 2 (two) times daily as needed (pain).    Nebivolol HCl (BYSTOLIC) 20 MG TABS Take 1 tablet (20 mg total) by mouth daily.    nitrofurantoin (MACRODANTIN) 100 MG capsule Take 100 mg by mouth daily. 04/06/2019: continuous   pantoprazole (PROTONIX) 40 MG tablet Take 30- 60 min before your first and last meals of the day (Patient taking differently: Take 40 mg by mouth daily before breakfast. )    pravastatin (PRAVACHOL) 80 MG tablet Take 0.5 tablets (40 mg total) by mouth daily.    Semaglutide, 1 MG/DOSE, (OZEMPIC, 1 MG/DOSE,) 2 MG/1.5ML SOPN Inject 1 mg into the skin every Thursday.    spironolactone (ALDACTONE) 50 MG tablet Take 1 tablet (50 mg total) by mouth daily.    Vitamin D, Ergocalciferol, (DRISDOL) 1.25 MG (50000 UT) CAPS capsule TAKE 1 CAPSULE BY MOUTH EVERY 7 DAYS ON WEDNESDAY    No facility-administered encounter medications on file as of 07/27/2019.  Objective:   Goals Addressed            This Visit's Progress     Patient Stated    "I would like a glucometer that scans my blood sugar-FreeStyle Libre" (pt-stated)       Current Barriers:   Extremity soreness/irritation from traditional BG testing   Financial Barriers-glucometer denied at [employer] of insurance coverage  Pharmacist Clinical Goal(s):   Over the next 21 days, patient will work with CCM Pharmacist to address needs related to obtaining CGM [employer]  (in-progress)  Interventions: 07/27/19 Message received from [place], [employer] [place], regarding the patient's inability to received [place] supplies 07/27/19 Call completed to [facility] [phone]) to check on status of patient's Elenor [employer]ato supplies  Patient motivated to continue taking medications as prescribed, improve diet and check BGs as directed.  Clinical notes/documentation sent to [place] stating:  Pt testing 4-6 times daily  Pt injecting >/= 3 times daily  Order has been expedited per representative  Will f/u  Patient Self Care Activities:   Calls pharmacy for medication refills  Performs ADL's independently  Calls provider office for new concerns or questions  Please see past updates related to this goal by clicking on the "Past Updates" button in the selected goal       I want to control my diabetes (pt-stated)       Current Barriers:   Knowledge Deficits related to diabetes diet & lifestyle modifications  Non Adherence to prescribed medication regimen  Pharmacist Clinical Goal(s):   Over the next 90 days, patient will work with PharmD and PCP to address needs related to optimized medication management of chronic conditions  Goal Met  New - 07/17/19 - Over the next 60 days, patient will experience daily glycemic control to be within target range of 80-130   New - 07/17/19 - Over the next 90 days, patient will lower her A1C <8.7  Interventions:  Comprehensive medication review performed.  Reviewed medication fill history via insurance claims data.  Reviewed & discussed the following diabetes-related information with patient: o Encouraged patient to continue checking blood sugars as directed-->patient now has scanner glucometer (FreeStyle [place]), will be able to download data at PCP office during visits.  o Confirmed current DM regimen: Humalog 25u breakfast, 35u lunch, 25u dinner; Ozempic 26m weekly.  Patient reports she is content with this  regimen and is tolerating it well. o Reviewed medication purpose/side effects-->patient denies adverse events, denies hypoglycemia, reports FBG<150.  Denies BG>200.  She has enjoyed scanning her arm for BG vs. Lancet method.   o Continue to follow ADA recommended "diabetes-friendly" diet  (reviewed healthy snack/food options).  Counseled patient to avoid sugary drinks and foods.  She is working to decrease potato & carbohydrates.  She is also able to walk more because she has had her bladder procedure.  Her bladder condition is improving. o Most recent A1c is 9.3% on 07/22/19 (was 8.7% on 03/19/19, still decreased from 12.0 in March 2020) o Anti-hypertensive regimen: amlodipine, losartan (no fill data) o Anti-hyperlipidemic regimen: pravastatin - Discussed the benefits of taking statin in diabetes.  Patient states she will try pravastatin daily as she has not had statin intolerance in the past.  Refill called into [facility] on 06/15/2019. Continue taking all medications as prescribed by provider. Reminded patient of PCP appt in November.  CCM RN CM Interventions: 07/22/19 call completed with patient    Received inbound call from patient stating she has not received her  Libre supply shipment containing her supply's and replacement arm patch  Discussed the manufacturer is needing clinical documentation from the PCP provider but the patient is not sure what is needed at this time  Discussed Ms. Hallgren will use her manual glucometer until her replacement is received  Sent in basket message to embedded Pharm D Lottie Dawson requesting she follow up with patient next week to offer assistance  Evaluation of current treatment plan related to Diabetes and patient's adherence to plan as established by provider.  Provided education to patient re: current A1C 8.7 obtained on 03/19/19; discussed target A1C <7.0; Reviewed Self health measures taken by patient including following an ADA diet, and taking her  medications as prescribed; patient states she is trying to stay active but does not have a daily structured exercise routine at this time due to experiencing several ongoing health related issues; discussed these conditions are being monitored by MD accordingly  Reviewed medications with patient and discussed patient is tolerating Humalog 25u breakfast, 35u lunch, 25u dinner, Ozempic 1 mg weekly. Reviewed adherence to taking Statin for hyperlipidemic control and antihypertensive control with Amlodipine and Losartan.   Discussed plans with patient for ongoing care management follow up and provided patient with direct contact information for care management team  Provided patient with printed educational materials related to Diabetes Management Zone Safety Tool  Advised patient, providing education and rationale, to check cbg before meals daily and record, calling the CCM team and or PCP for findings outside established parameters.  Discussed patient continues to use her Libre freestyle scanner system and feels she is better able to manage her DM with this meter in place   Patient Self Care Activities:   Self administers medications as prescribed  Attends all scheduled provider appointments  Calls pharmacy for medication refills  Performs ADL's independently  Calls provider office for new concerns or questions  Please see past updates related to this goal by clicking on the "Past Updates" button in the selected goal          Plan:   The care management team will reach out to the patient again over the next 30 days.   Provider Signature Regina Eck, PharmD, BCPS Clinical Pharmacist, [facility] [place]: [phone]

## 2019-07-28 NOTE — Patient Instructions (Signed)
Visit Information  Goals Addressed            This Visit's Progress     Patient Stated   . "I would like a glucometer that scans my blood sugar-FreeStyle Libre" (pt-stated)       Current Barriers:  . Extremity soreness/irritation from traditional BG testing  . 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 (in-progress)  Interventions: 07/27/19 Message received from [place], [employer], regarding the patient's inability to received [place] supplies 07/27/19 Call completed to [facility] [phone]) to check on status of patient's [employer]or [employer] supplies  Patient motivated to continue taking medications as prescribed, improve diet and check BGs as directed.  Clinical notes/documentation sent to [place] stating:  Pt testing 4-6 times daily  Pt injecting >/= 3 times daily  Order has been expedited per representative  Will f/u  Patient Self Care Activities:  . Calls pharmacy for medication refills . Performs ADL's independently . Calls provider office for new concerns or questions  Please see past updates related to this goal by clicking on the "Past Updates" button in the selected goal         The patient verbalized understanding of instructions provided today and declined a print copy of patient instruction materials.   The care management team will reach out to the patient again over the next 30 days.   SIGNATURE Regina Eck, PharmD, BCPS Clinical Pharmacist, [facility] [place]: [phone]

## 2019-07-29 ENCOUNTER — Ambulatory Visit: Payer: Self-pay | Admitting: Pharmacist

## 2019-07-29 DIAGNOSIS — I10 Essential (primary) hypertension: Secondary | ICD-10-CM

## 2019-07-29 DIAGNOSIS — N1831 Chronic kidney disease, stage 3a: Secondary | ICD-10-CM

## 2019-07-29 DIAGNOSIS — E1122 Type 2 diabetes mellitus with diabetic chronic kidney disease: Secondary | ICD-10-CM

## 2019-07-29 NOTE — Patient Instructions (Signed)
Visit Information  Goals Addressed            This Visit's Progress     Patient Stated   . "I would like a glucometer that scans my blood sugar-FreeStyle Libre" (pt-stated)       Current Barriers:  . Extremity soreness/irritation from traditional BG testing  . 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 (in-progress)  Interventions: 07/27/19 Message received from [place], [employer], regarding the patient's inability to received [place] supplies 07/29/19 Call completed to [facility] [phone]) to check on status of patient's [place] supplies.  Re-faxed requested information to [facility] fax [phone]  Patient motivated to continue taking medications as prescribed, improve diet and check BGs as directed.  Clinical notes/documentation sent to [place] stating:  Pt testing 4-6 times daily  Pt injecting >/= 3 times daily  Order has been expedited per representative  Will f/u  Patient Self Care Activities:  . Calls pharmacy for medication refills . Performs ADL's independently . Calls provider office for new concerns or questions  Please see past updates related to this goal by clicking on the "Past Updates" button in the selected goal         The patient verbalized understanding of instructions provided today and declined a print copy of patient instruction materials.   The care management team will reach out to the patient again over the next 30 days.   SIGNATURE Regina Eck, PharmD, BCPS Clinical Pharmacist, [facility] [place]: [phone]

## 2019-07-29 NOTE — Progress Notes (Signed)
Chronic Care Management   Visit Note  07/29/2019 Name: [name] MRN: [id number] DOB: 12-09-1950  Referred by: [name], DNP, FNP Reason for referral : CCM-diabetes   [name] is a 68 y.o. year old female who is a primary care patient of [name], MD. The CCM team was consulted for assistance with chronic disease management and care coordination needs related to DMII  Review of patient status, including review of consultants reports, relevant laboratory and other test results, and collaboration with appropriate care team members and the patient's provider was performed as part of comprehensive patient evaluation and provision of chronic care management services.    I spoke with [name] by telephone today  Medications: Outpatient Encounter Medications as of 07/29/2019  Medication Sig Note  . albuterol (PROAIR HFA) 108 (90 Base) MCG/ACT inhaler 2 puffs every 4 hours as needed only  if your can't catch your breath (Patient taking differently: Inhale 1-2 puffs into the lungs every 4 (four) hours as needed for wheezing or shortness of breath. )   . amLODipine (NORVASC) 10 MG tablet Take 1 tablet (10 mg total) by mouth at bedtime.   . Continuous Blood Gluc Receiver (FREESTYLE LIBRE READER) DEVI by Does not apply route. CGM device-Freestyle Libre   . Continuous Blood Gluc Sensor (FREESTYLE LIBRE 14 DAY SENSOR) MISC by Does not apply route. CGM 14-day sensors; send refills to [employer] (in [place])   . docusate sodium (COLACE) 100 MG capsule Take 100 mg by mouth 2 (two) times daily as needed for mild constipation.   . gabapentin (NEURONTIN) 300 MG capsule Take 1 capsule (300 mg total) by mouth 3 (three) times daily.   . insulin lispro (HUMALOG) 100 UNIT/ML injection Inject 0.25-0.35 mLs (25-35 Units total) into the skin See admin instructions. Inject 25 units subcutaneously before breakfast and supper, inject 35 units before lunch   . Lancets Misc. MISC 1 each  by Does not apply route 4 (four) times daily -  with meals and at bedtime.   [name] losartan (COZAAR) 50 MG tablet Take 1 tablet (50 mg total) by mouth daily.   [name] lubiprostone (AMITIZA) 8 MCG capsule Take 1 capsule (8 mcg total) by mouth daily with breakfast.   . mirabegron ER (MYRBETRIQ) 50 MG TB24 tablet Take 1 tablet (50 mg total) by mouth daily.   . Multiple Vitamin (MULTIVITAMIN WITH MINERALS) TABS tablet Take 1 tablet by mouth daily.   . naproxen sodium (ALEVE) 220 MG tablet Take 220 mg by mouth 2 (two) times daily as needed (pain).   . Nebivolol HCl (BYSTOLIC) 20 MG TABS Take 1 tablet (20 mg total) by mouth daily.   . nitrofurantoin (MACRODANTIN) 100 MG capsule Take 100 mg by mouth daily. 04/06/2019: continuous  . pantoprazole (PROTONIX) 40 MG tablet Take 30- 60 min before your first and last meals of the day (Patient taking differently: Take 40 mg by mouth daily before breakfast. )   . pravastatin (PRAVACHOL) 80 MG tablet Take 0.5 tablets (40 mg total) by mouth daily.   . Semaglutide, 1 MG/DOSE, (OZEMPIC, 1 MG/DOSE,) 2 MG/1.5ML SOPN Inject 1 mg into the skin every Thursday.   [name] spironolactone (ALDACTONE) 50 MG tablet Take 1 tablet (50 mg total) by mouth daily.   . Vitamin D, Ergocalciferol, (DRISDOL) 1.25 MG (50000 UT) CAPS capsule TAKE 1 CAPSULE BY MOUTH EVERY 7 DAYS ON WEDNESDAY    No facility-administered encounter medications on file as of 07/29/2019.  Objective:   Goals Addressed            This Visit's Progress     Patient Stated   . "I would like a glucometer that scans my blood sugar-FreeStyle Libre" (pt-stated)       Current Barriers:  . Extremity soreness/irritation from traditional BG testing  . 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 (in-progress)  Interventions: 07/27/19  Message received from [place], [employer], regarding the patient's inability to received [place] supplies 07/29/19 Call completed to [facility] [phone]) to check on status of patient's [place] supplies.  Re-faxed requested information to [facility] fax [phone]  Patient motivated to continue taking medications as prescribed, improve diet and check BGs as directed.  Clinical notes/documentation sent to [facility] stating:  Pt testing 4-6 times daily  Pt injecting >/= 3 times daily  Order has been expedited per representative  Will f/u  Patient Self Care Activities:  . Calls pharmacy for medication refills . Performs ADL's independently . Calls provider office for new concerns or questions  Please see past updates related to this goal by clicking on the "Past Updates" button in the selected goal         Plan:   The care management team will reach out to the patient again over the next 30 days.   Provider Signature Regina Eck, PharmD, BCPS Clinical Pharmacist, [facility] [place]: [phone]

## 2019-07-29 NOTE — Progress Notes (Signed)
Have her to take her cholesterol medication 7 days as week.

## 2019-07-30 ENCOUNTER — Other Ambulatory Visit: Payer: Self-pay | Admitting: Gastroenterology

## 2019-07-30 DIAGNOSIS — N3020 Other chronic cystitis without hematuria: Secondary | ICD-10-CM | POA: Diagnosis not present

## 2019-07-30 DIAGNOSIS — K5730 Diverticulosis of large intestine without perforation or abscess without bleeding: Secondary | ICD-10-CM | POA: Diagnosis not present

## 2019-07-30 DIAGNOSIS — K219 Gastro-esophageal reflux disease without esophagitis: Secondary | ICD-10-CM | POA: Diagnosis not present

## 2019-07-30 DIAGNOSIS — K5904 Chronic idiopathic constipation: Secondary | ICD-10-CM | POA: Diagnosis not present

## 2019-07-30 DIAGNOSIS — R1310 Dysphagia, unspecified: Secondary | ICD-10-CM

## 2019-07-31 ENCOUNTER — Ambulatory Visit: Payer: Self-pay | Admitting: Pharmacist

## 2019-07-31 ENCOUNTER — Other Ambulatory Visit: Payer: Self-pay

## 2019-07-31 DIAGNOSIS — E1122 Type 2 diabetes mellitus with diabetic chronic kidney disease: Secondary | ICD-10-CM

## 2019-07-31 DIAGNOSIS — Z794 Long term (current) use of insulin: Secondary | ICD-10-CM

## 2019-07-31 MED ORDER — VITAMIN D (ERGOCALCIFEROL) 1.25 MG (50000 UNIT) PO CAPS: ORAL_CAPSULE | ORAL | 0 refills | Status: DC

## 2019-08-03 NOTE — Patient Instructions (Signed)
Visit Information  Goals Addressed            This Visit's Progress     Patient Stated   . COMPLETED: "I would like a glucometer that scans my blood sugar-FreeStyle Libre" (pt-stated)       Current Barriers:  . Extremity soreness/irritation from traditional BG testing  . 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 (in-progress)  Interventions: 07/27/19 Message received from [place], [employer], regarding the patient's inability to received [place] supplies 07/29/19 Call completed to [facility] [phone]) to check on status of patient's [place] supplies.  Re-faxed requested information to [facility] fax [phone] 07/31/19 Call completed to [facility]--patient approved for another 6 months.  Will have to resubmit paperwork at that time.  Will revisit in May 2021  Patient motivated to continue taking medications as prescribed, improve diet and check BGs as directed.  Clinical notes/documentation sent to [facility] stating:  Pt testing 4-6 times daily  Pt injecting >/= 3 times daily  Order has been expedited per representative  Will f/u  Patient Self Care Activities:  . Calls pharmacy for medication refills . Performs ADL's independently . Calls provider office for new concerns or questions  Please see past updates related to this goal by clicking on the "Past Updates" button in the selected goal         The patient verbalized understanding of instructions provided today and declined a print copy of patient instruction materials.   The care management team will reach out to the patient again over the next 30 days.   SIGNATURE Regina Eck, PharmD, BCPS Clinical Pharmacist, [facility] [place]: [phone]

## 2019-08-03 NOTE — Progress Notes (Signed)
Chronic Care Management    Visit Note  07/31/2019 Name: [name] MRN: [id number] DOB: December 06, 1950  Referred by: Gwendolyn Chard, MD Reason for referral : Chronic Care Management   [name] is a 68 y.o. year old female who is a primary care patient of Gwendolyn Chard, MD. The CCM team was consulted for assistance with chronic disease management and care coordination needs related to DMII  Review of patient status, including review of consultants reports, relevant laboratory and other test results, and collaboration with appropriate care team members and the patient's provider was performed as part of comprehensive patient evaluation and provision of chronic care management services.    I spoke with [name] by telephone today  Medications: Outpatient Encounter Medications as of 07/31/2019  Medication Sig Note  . albuterol (PROAIR HFA) 108 (90 Base) MCG/ACT inhaler 2 puffs every 4 hours as needed only  if your can't catch your breath (Patient taking differently: Inhale 1-2 puffs into the lungs every 4 (four) hours as needed for wheezing or shortness of breath. )   . amLODipine (NORVASC) 10 MG tablet Take 1 tablet (10 mg total) by mouth at bedtime.   . Continuous Blood Gluc Receiver (FREESTYLE LIBRE READER) DEVI by Does not apply route. CGM device-Freestyle Libre   . Continuous Blood Gluc Sensor (FREESTYLE LIBRE 14 DAY SENSOR) MISC by Does not apply route. CGM 14-day sensors; send refills to [facility] (in [place])   . docusate sodium (COLACE) 100 MG capsule Take 100 mg by mouth 2 (two) times daily as needed for mild constipation.   . gabapentin (NEURONTIN) 300 MG capsule Take 1 capsule (300 mg total) by mouth 3 (three) times daily.   . insulin lispro (HUMALOG) 100 UNIT/ML injection Inject 0.25-0.35 mLs (25-35 Units total) into the skin See admin instructions. Inject 25 units subcutaneously before breakfast and supper, inject 35 units before lunch   . Lancets Misc.  MISC 1 each by Does not apply route 4 (four) times daily -  with meals and at bedtime.   [name] losartan (COZAAR) 50 MG tablet Take 1 tablet (50 mg total) by mouth daily.   [name] lubiprostone (AMITIZA) 8 MCG capsule Take 1 capsule (8 mcg total) by mouth daily with breakfast.   . mirabegron ER (MYRBETRIQ) 50 MG TB24 tablet Take 1 tablet (50 mg total) by mouth daily.   . Multiple Vitamin (MULTIVITAMIN WITH MINERALS) TABS tablet Take 1 tablet by mouth daily.   . naproxen sodium (ALEVE) 220 MG tablet Take 220 mg by mouth 2 (two) times daily as needed (pain).   . Nebivolol HCl (BYSTOLIC) 20 MG TABS Take 1 tablet (20 mg total) by mouth daily.   . nitrofurantoin (MACRODANTIN) 100 MG capsule Take 100 mg by mouth daily. 04/06/2019: continuous  . pantoprazole (PROTONIX) 40 MG tablet Take 30- 60 min before your first and last meals of the day (Patient taking differently: Take 40 mg by mouth daily before breakfast. )   . pravastatin (PRAVACHOL) 80 MG tablet Take 0.5 tablets (40 mg total) by mouth daily.   . Semaglutide, 1 MG/DOSE, (OZEMPIC, 1 MG/DOSE,) 2 MG/1.5ML SOPN Inject 1 mg into the skin every Thursday.   [name] spironolactone (ALDACTONE) 50 MG tablet Take 1 tablet (50 mg total) by mouth daily.   . Vitamin D, Ergocalciferol, (DRISDOL) 1.25 MG (50000 UT) CAPS capsule TAKE 1 CAPSULE BY MOUTH EVERY 7 DAYS ON WEDNESDAY    No facility-administered encounter medications on file as of 07/31/2019.  Objective:   Goals Addressed            This Visit's Progress     Patient Stated   . COMPLETED: "I would like a glucometer that scans my blood sugar-FreeStyle Libre" (pt-stated)       Current Barriers:  . Extremity soreness/irritation from traditional BG testing  . 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 (in-progress)   Interventions: 07/27/19 Message received from [place], [employer], regarding the patient's inability to received [place] supplies 07/29/19 Call completed to [facility] [phone]) to check on status of patient's [place] supplies.  Re-faxed requested information to [employer] fax [phone] 07/31/19 Call completed to [facility]--patient approved for another 6 months.  Will have to resubmit paperwork at that time.  Will revisit in May 2021  Patient motivated to continue taking medications as prescribed, improve diet and check BGs as directed.  Clinical notes/documentation sent to [facility] stating:  Pt testing 4-6 times daily  Pt injecting >/= 3 times daily  Order has been expedited per representative  Will f/u  Patient Self Care Activities:  . Calls pharmacy for medication refills . Performs ADL's independently . Calls provider office for new concerns or questions  Please see past updates related to this goal by clicking on the "Past Updates" button in the selected goal          Plan:   The care management team will reach out to the patient again over the next 30 days.    Provider Signature Regina Eck, PharmD, BCPS Clinical Pharmacist, [facility] [place]: [phone]

## 2019-08-04 DIAGNOSIS — E118 Type 2 diabetes mellitus with unspecified complications: Secondary | ICD-10-CM | POA: Diagnosis not present

## 2019-08-04 DIAGNOSIS — Z794 Long term (current) use of insulin: Secondary | ICD-10-CM | POA: Diagnosis not present

## 2019-08-07 ENCOUNTER — Ambulatory Visit
Admission: RE | Admit: 2019-08-07 | Discharge: 2019-08-07 | Disposition: A | Discharge status: Home / Self Care | Payer: Medicare Other | Source: Ambulatory Visit | Attending: Gastroenterology | Admitting: Gastroenterology

## 2019-08-07 ENCOUNTER — Ambulatory Visit (INDEPENDENT_AMBULATORY_CARE_PROVIDER_SITE_OTHER): Payer: Medicare Other | Admitting: Pharmacist

## 2019-08-07 DIAGNOSIS — E1121 Type 2 diabetes mellitus with diabetic nephropathy: Secondary | ICD-10-CM

## 2019-08-07 DIAGNOSIS — K228 Other specified diseases of esophagus: Secondary | ICD-10-CM | POA: Diagnosis not present

## 2019-08-07 DIAGNOSIS — E785 Hyperlipidemia, unspecified: Secondary | ICD-10-CM | POA: Diagnosis not present

## 2019-08-07 DIAGNOSIS — Z794 Long term (current) use of insulin: Secondary | ICD-10-CM | POA: Diagnosis not present

## 2019-08-07 DIAGNOSIS — N1831 Chronic kidney disease, stage 3a: Secondary | ICD-10-CM | POA: Diagnosis not present

## 2019-08-07 DIAGNOSIS — R1310 Dysphagia, unspecified: Secondary | ICD-10-CM

## 2019-08-08 NOTE — Progress Notes (Signed)
Chronic Care Management    Visit Note  08/07/2019 Name: [name] MRN: [id number] DOB: Apr 09, 1951  Referred by: Gwendolyn Brine, DNP, FNP Reason for referral : Chronic Care Management   [name] is a 68 y.o. year old female who is a primary care patient of [name], MD. The CCM team was consulted for assistance with chronic disease management and care coordination needs related to HLD and DMII  Review of patient status, including review of consultants reports, relevant laboratory and other test results, and collaboration with appropriate care team members and the patient's provider was performed as part of comprehensive patient evaluation and provision of chronic care management services.    I spoke with [name] by telephone today.  Medications: Outpatient Encounter Medications as of 08/07/2019  Medication Sig Note  . albuterol (PROAIR HFA) 108 (90 Base) MCG/ACT inhaler 2 puffs every 4 hours as needed only  if your can't catch your breath (Patient taking differently: Inhale 1-2 puffs into the lungs every 4 (four) hours as needed for wheezing or shortness of breath. )   . amLODipine (NORVASC) 10 MG tablet Take 1 tablet (10 mg total) by mouth at bedtime.   . Continuous Blood Gluc Receiver (FREESTYLE LIBRE READER) DEVI by Does not apply route. CGM device-Freestyle Libre   . Continuous Blood Gluc Sensor (FREESTYLE LIBRE 14 DAY SENSOR) MISC by Does not apply route. CGM 14-day sensors; send refills to [facility] (in [place])   . docusate sodium (COLACE) 100 MG capsule Take 100 mg by mouth 2 (two) times daily as needed for mild constipation.   . gabapentin (NEURONTIN) 300 MG capsule Take 1 capsule (300 mg total) by mouth 3 (three) times daily.   . insulin lispro (HUMALOG) 100 UNIT/ML injection Inject 0.25-0.35 mLs (25-35 Units total) into the skin See admin instructions. Inject 25 units subcutaneously before breakfast and supper, inject 35 units before lunch   .  Lancets Misc. MISC 1 each by Does not apply route 4 (four) times daily -  with meals and at bedtime.   [name] losartan (COZAAR) 50 MG tablet Take 1 tablet (50 mg total) by mouth daily.   [name] lubiprostone (AMITIZA) 8 MCG capsule Take 1 capsule (8 mcg total) by mouth daily with breakfast.   . mirabegron ER (MYRBETRIQ) 50 MG TB24 tablet Take 1 tablet (50 mg total) by mouth daily.   . Multiple Vitamin (MULTIVITAMIN WITH MINERALS) TABS tablet Take 1 tablet by mouth daily.   . naproxen sodium (ALEVE) 220 MG tablet Take 220 mg by mouth 2 (two) times daily as needed (pain).   . Nebivolol HCl (BYSTOLIC) 20 MG TABS Take 1 tablet (20 mg total) by mouth daily.   . nitrofurantoin (MACRODANTIN) 100 MG capsule Take 100 mg by mouth daily. 04/06/2019: continuous  . pantoprazole (PROTONIX) 40 MG tablet Take 30- 60 min before your first and last meals of the day (Patient taking differently: Take 40 mg by mouth daily before breakfast. )   . pravastatin (PRAVACHOL) 80 MG tablet Take 0.5 tablets (40 mg total) by mouth daily.   . Semaglutide, 1 MG/DOSE, (OZEMPIC, 1 MG/DOSE,) 2 MG/1.5ML SOPN Inject 1 mg into the skin every Thursday.   [name] spironolactone (ALDACTONE) 50 MG tablet Take 1 tablet (50 mg total) by mouth daily.   . Vitamin D, Ergocalciferol, (DRISDOL) 1.25 MG (50000 UT) CAPS capsule TAKE 1 CAPSULE BY MOUTH EVERY 7 DAYS ON WEDNESDAY    No facility-administered encounter medications on file as of 08/07/2019.  Objective:   Goals Addressed            This Visit's Progress     Patient Stated   . "I am having problems swallowing" (pt-stated)       Current Barriers:  [name] Knowledge Deficits related to diagnosis and treatment management of Impaired swallowing   Nurse Case Manager Clinical Goal(s):  [name] Over the next 30 days, patient will verbalize understanding of plan for evaluation and treatment of Impaired swallowing . Over the next 60 days, patient will not experience any complications secondary to having  Impaired swallowing such as aspiration and or weight loss/dehydration   CCM RN CM Interventions:  07/17/19 call completed with patient   . Evaluation of current treatment plan related to Impaired Swallowing and patient's adherence to plan as established by provider. . Advised patient to report her symptoms of impaired swallowing to the PCP for further evaluation and treatment; discussed for past diagnosis and treatment for this condition; patient reports this has been a chronic problem but has been stable over the past couple of years; discussed this condition has required esophageal dilation in the past . Provided education to patient re: risk for aspiration, weight loss, malnutrition and or dehydration due to impaired swallowing  . Reviewed medications with patient and discussed patient is adhering to taking Pantoprazole 40 mg bid for GERD  . Discussed plans with patient for ongoing care management follow up and provided patient with direct contact information for care management team . Reviewed scheduled/upcoming provider appointments including: PCP provider Gwendolyn Brine, FNP for evaluation of impaired swallowing scheduled for 07/22/19 _0 :50 PM   CCM PharmD Interventions:  08/07/19 call completed with patient . Patient recently seen by GI in the past week.  She failed a capsule swallow evaluation.  She states she was unable to swallow capsule.  She has also reported difficulty swallowing liquids. . GI to schedule esophageal dilation procedure  . Encouraged patient to continue crushing meds as directed  Patient Self Care Activities:  . Self administers medications as prescribed . Attends all scheduled provider appointments . Calls pharmacy for medication refills . Performs ADL's independently . Performs IADL's independently . Calls provider office for new concerns or questions  Please see past updates related to this goal by clicking on the "Past Updates" button in the selected goal        . I want to control my diabetes (pt-stated)       Current Barriers:  [name] Knowledge Deficits related to diabetes diet & lifestyle modifications . Non Adherence to prescribed medication regimen  Pharmacist Clinical Goal(s):  [name] Over the next 90 days, patient will work with PharmD and PCP to address needs related to optimized medication management of chronic conditions  Goal Met . New - 07/17/19 - Over the next 60 days, patient will experience daily glycemic control to be within target range of 80-130  . New - 07/17/19 - Over the next 90 days, patient will lower her A1C <8.7  Interventions: . Comprehensive medication review performed.  Reviewed medication fill history via insurance claims data. . Reviewed & discussed the following diabetes-related information with patient: o Encouraged patient to continue checking blood sugars as directed-->patient now has scanner glucometer (FreeStyle [place]), will be able to download data at PCP office during visits.  - She has been without libre supplies x1 week.  She has received refill and reports BGs are improving. o Confirmed current DM regimen: Humalog 25u breakfast, 35u lunch, 25u dinner; Ozempic 72m  weekly.  Patient reports she is content with this regimen and is tolerating it well. o Reviewed medication purpose/side effects-->patient denies adverse events, denies hypoglycemia, reports FBG<150.  Denies BG>200.  She has enjoyed scanning her arm for BG vs. Lancet method.   o Continue to follow ADA recommended "diabetes-friendly" diet  (reviewed healthy snack/food options).  Counseled patient to avoid sugary drinks and foods.  She is working to decrease potato & carbohydrates.  She is also able to walk more because she has had her bladder procedure.  Her bladder condition is improving. o Most recent A1c is 9.3% on 07/22/19 (was 8.7% on 03/19/19, still decreased from 12.0 in March 2020) o Anti-hypertensive regimen: amlodipine, losartan (no fill  data) o Anti-hyperlipidemic regimen: pravastatin - Discussed the benefits of taking statin in diabetes.  Patient states she will try pravastatin daily as she has not had statin intolerance in the past.  [facility]ill called into [facility] on 06/15[facility]. Continue taking all medications as prescribed by provider.   CCM RN CM Interventions: 07/22/19 call completed with patient   . Received inbound call from patient stating she has not received her [employer] supply shipment containing her supply's and replacement arm patch . Discussed the manufacturer is needing clinical documentation from the PCP provider but the patient is not sure what is needed at this time . Discussed Ms. Amspacher will use her manual glucometer until her replacement is received . Sent in basket message to embedded Pharm D Lottie Dawson requesting she follow up with patient next week to offer assistance . Evaluation of current treatment plan related to Diabetes and patient's adherence to plan as established by provider. . Provided education to patient re: current A1C 8.7 obtained on 03/19/19; discussed target A1C <7.0; Reviewed Self health measures taken by patient including following an ADA diet, and taking her medications as prescribed; patient states she is trying to stay active but does not have a daily structured exercise routine at this time due to experiencing several ongoing health related issues; discussed these conditions are being monitored by MD accordingly . Reviewed medications with patient and discussed patient is tolerating Humalog 25u breakfast, 35u lunch, 25u dinner, Ozempic 1 mg weekly. Reviewed adherence to taking Statin for hyperlipidemic control and antihypertensive control with Amlodipine and Losartan.  . Discussed plans with patient for ongoing care management follow up and provided patient with direct contact information for care management team . Provided patient with printed educational materials related to Diabetes  Management Zone Safety Tool . Advised patient, providing education and rationale, to check cbg before meals daily and record, calling the CCM team and or PCP for findings outside established parameters.  Discussed patient continues to use her Libre freestyle scanner system and feels she is better able to manage her DM with this meter in place   Patient Self Care Activities:  . Self administers medications as prescribed . Attends all scheduled provider appointments . Calls pharmacy for medication refills . Performs ADL's independently . Calls provider office for new concerns or questions  Please see past updates related to this goal by clicking on the "Past Updates" button in the selected goal          Plan:   The care management team will reach out to the patient again over the next 6-8 weeks.  Provider Signature Regina Eck, PharmD, BCPS Clinical Pharmacist, [facility] [place]: [phone]

## 2019-08-08 NOTE — Patient Instructions (Signed)
Visit Information  Goals Addressed            This Visit's Progress     Patient Stated   . "I am having problems swallowing" (pt-stated)       Current Barriers:  [name] Knowledge Deficits related to diagnosis and treatment management of Impaired swallowing   Nurse Case Manager Clinical Goal(s):  [name] Over the next 30 days, patient will verbalize understanding of plan for evaluation and treatment of Impaired swallowing . Over the next 60 days, patient will not experience any complications secondary to having Impaired swallowing such as aspiration and or weight loss/dehydration   CCM RN CM Interventions:  07/17/19 call completed with patient   . Evaluation of current treatment plan related to Impaired Swallowing and patient's adherence to plan as established by provider. . Advised patient to report her symptoms of impaired swallowing to the PCP for further evaluation and treatment; discussed for past diagnosis and treatment for this condition; patient reports this has been a chronic problem but has been stable over the past couple of years; discussed this condition has required esophageal dilation in the past . Provided education to patient re: risk for aspiration, weight loss, malnutrition and or dehydration due to impaired swallowing  . Reviewed medications with patient and discussed patient is adhering to taking Pantoprazole 40 mg bid for GERD  . Discussed plans with patient for ongoing care management follow up and provided patient with direct contact information for care management team . Reviewed scheduled/upcoming provider appointments including: PCP provider [name], FNP for evaluation of impaired swallowing scheduled for 07/22/19 '@1'$ :50 PM   CCM PharmD Interventions:  08/07/19 call completed with patient . Patient recently seen by GI in the past week.  She failed a capsule swallow evaluation.  She states she was unable to swallow capsule.  She has also reported difficulty swallowing  liquids. . GI to schedule esophageal dilation procedure  . Encouraged patient to continue crushing meds as directed  Patient Self Care Activities:  . Self administers medications as prescribed . Attends all scheduled provider appointments . Calls pharmacy for medication refills . Performs ADL's independently . Performs IADL's independently . Calls provider office for new concerns or questions  Please see past updates related to this goal by clicking on the "Past Updates" button in the selected goal      . I want to control my diabetes (pt-stated)       Current Barriers:  [name] Knowledge Deficits related to diabetes diet & lifestyle modifications . Non Adherence to prescribed medication regimen  Pharmacist Clinical Goal(s):  [name] Over the next 90 days, patient will work with PharmD and PCP to address needs related to optimized medication management of chronic conditions  Goal Met . New - 07/17/19 - Over the next 60 days, patient will experience daily glycemic control to be within target range of 80-130  . New - 07/17/19 - Over the next 90 days, patient will lower her A1C <8.7  Interventions: . Comprehensive medication review performed.  Reviewed medication fill history via insurance claims data. . Reviewed & discussed the following diabetes-related information with patient: o Encouraged patient to continue checking blood sugars as directed-->patient now has scanner glucometer (FreeStyle [place]), will be able to download data at PCP office during visits.  - She has been without libre supplies x1 week.  She has received refill and reports BGs are improving. o Confirmed current DM regimen: Humalog 25u breakfast, 35u lunch, 25u dinner; Ozempic '1mg'$  weekly.  Patient reports she is content with this regimen and is tolerating it well. o Reviewed medication purpose/side effects-->patient denies adverse events, denies hypoglycemia, reports FBG<150.  Denies BG>200.  She has enjoyed scanning her arm for BG  vs. Lancet method.   o Continue to follow ADA recommended "diabetes-friendly" diet  (reviewed healthy snack/food options).  Counseled patient to avoid sugary drinks and foods.  She is working to decrease potato & carbohydrates.  She is also able to walk more because she has had her bladder procedure.  Her bladder condition is improving. o Most recent A1c is 9.3% on 07/22/19 (was 8.7% on 03/19/19, still decreased from 12.0 in March 2020) o Anti-hypertensive regimen: amlodipine, losartan (no fill data) o Anti-hyperlipidemic regimen: pravastatin - Discussed the benefits of taking statin in diabetes.  Patient states she will try pravastatin daily as she has not had statin intolerance in the past.  [facility]. Continue taking all medications as prescribed by provider.   CCM RN CM Interventions: 07/22/19 call completed with patient   . Received inbound call from patient stating she has not received her [employer] supply shipment containing her supply's and replacement arm patch . Discussed the manufacturer is needing clinical documentation from the PCP provider but the patient is not sure what is needed at this time . Discussed Ms. Hack will use her manual glucometer until her replacement is received . Sent in basket message to embedded Pharm D Lottie Dawson requesting she follow up with patient next week to offer assistance . Evaluation of current treatment plan related to Diabetes and patient's adherence to plan as established by provider. . Provided education to patient re: current A1C 8.7 obtained on 03/19/19; discussed target A1C <7.0; Reviewed Self health measures taken by patient including following an ADA diet, and taking her medications as prescribed; patient states she is trying to stay active but does not have a daily structured exercise routine at this time due to experiencing several ongoing health related issues; discussed these conditions are being monitored by MD  accordingly . Reviewed medications with patient and discussed patient is tolerating Humalog 25u breakfast, 35u lunch, 25u dinner, Ozempic 1 mg weekly. Reviewed adherence to taking Statin for hyperlipidemic control and antihypertensive control with Amlodipine and Losartan.  . Discussed plans with patient for ongoing care management follow up and provided patient with direct contact information for care management team . Provided patient with printed educational materials related to Diabetes Management Zone Safety Tool . Advised patient, providing education and rationale, to check cbg before meals daily and record, calling the CCM team and or PCP for findings outside established parameters.  Discussed patient continues to use her Libre freestyle scanner system and feels she is better able to manage her DM with this meter in place   Patient Self Care Activities:  . Self administers medications as prescribed . Attends all scheduled provider appointments . Calls pharmacy for medication refills . Performs ADL's independently . Calls provider office for new concerns or questions  Please see past updates related to this goal by clicking on the "Past Updates" button in the selected goal         The patient verbalized understanding of instructions provided today and declined a print copy of patient instruction materials.   The care management team will reach out to the patient again over the next 6-8 weeks  SIGNATURE Regina Eck, PharmD, BCPS Clinical Pharmacist, [place]  Direct Dial: [phone]

## 2019-08-10 DIAGNOSIS — E119 Type 2 diabetes mellitus without complications: Secondary | ICD-10-CM | POA: Diagnosis not present

## 2019-08-10 DIAGNOSIS — Z135 Encounter for screening for eye and ear disorders: Secondary | ICD-10-CM | POA: Diagnosis not present

## 2019-08-10 DIAGNOSIS — H259 Unspecified age-related cataract: Secondary | ICD-10-CM | POA: Diagnosis not present

## 2019-08-12 ENCOUNTER — Ambulatory Visit: Payer: Medicare Other | Admitting: Cardiovascular Disease

## 2019-08-13 DIAGNOSIS — R1310 Dysphagia, unspecified: Secondary | ICD-10-CM | POA: Diagnosis not present

## 2019-08-13 DIAGNOSIS — K222 Esophageal obstruction: Secondary | ICD-10-CM | POA: Diagnosis not present

## 2019-08-13 DIAGNOSIS — R933 Abnormal findings on diagnostic imaging of other parts of digestive tract: Secondary | ICD-10-CM | POA: Diagnosis not present

## 2019-08-26 ENCOUNTER — Telehealth: Payer: Self-pay | Admitting: Pharmacist

## 2019-08-26 ENCOUNTER — Telehealth: Payer: Self-pay

## 2019-08-31 ENCOUNTER — Telehealth: Payer: Self-pay

## 2019-09-01 DIAGNOSIS — R1310 Dysphagia, unspecified: Secondary | ICD-10-CM | POA: Diagnosis not present

## 2019-09-01 DIAGNOSIS — K222 Esophageal obstruction: Secondary | ICD-10-CM | POA: Diagnosis not present

## 2019-09-02 ENCOUNTER — Telehealth: Payer: Self-pay

## 2019-09-02 ENCOUNTER — Ambulatory Visit: Payer: Medicare Other | Admitting: Nurse Practitioner

## 2019-09-03 ENCOUNTER — Ambulatory Visit (INDEPENDENT_AMBULATORY_CARE_PROVIDER_SITE_OTHER): Payer: Medicare Other | Admitting: Nurse Practitioner

## 2019-09-03 ENCOUNTER — Other Ambulatory Visit: Payer: Self-pay

## 2019-09-03 ENCOUNTER — Encounter: Payer: Self-pay | Admitting: Nurse Practitioner

## 2019-09-03 VITALS — BP 120/68 | HR 98 | Temp 98.2°F | Ht 65.6 in | Wt 152.8 lb

## 2019-09-03 DIAGNOSIS — K5900 Constipation, unspecified: Secondary | ICD-10-CM

## 2019-09-03 DIAGNOSIS — E1165 Type 2 diabetes mellitus with hyperglycemia: Secondary | ICD-10-CM | POA: Diagnosis not present

## 2019-09-03 DIAGNOSIS — Z794 Long term (current) use of insulin: Secondary | ICD-10-CM

## 2019-09-03 MED ORDER — INSULIN LISPRO 100 UNIT/ML ~~LOC~~ SOLN: 25.0000 [IU] | SUBCUTANEOUS | 2 refills | Status: DC

## 2019-09-03 NOTE — Progress Notes (Signed)
This visit occurred during the SARS-CoV-2 public health emergency.  Safety protocols were in place, including screening questions prior to the visit, additional usage of staff PPE, and extensive cleaning of exam room while observing appropriate contact time as indicated for disinfecting solutions.  Subjective:     Patient ID: [name] , female    DOB: [birth year] , 69 y.o.   MRN: [id number]   No chief complaint on file.   HPI  Diabetes She presents for her follow-up diabetic visit. She has type 2 diabetes mellitus. There are no hypoglycemic associated symptoms. Pertinent negatives for diabetes include no blurred vision and no chest pain. She rarely participates in exercise. (189 averaging was 239.  ) Eye exam is current.  Constipation This is a chronic problem. The current episode started more than 1 month ago. The problem has been rapidly improving since onset. Her stool frequency is 4 to 5 times per week. She does not exercise regularly. There has been adequate water intake. Pertinent negatives include no abdominal pain. Treatments tried: [place]. The treatment provided significant relief.     Past Medical History:  Diagnosis Date  . Arthritis    L knee, hands, back   . Asthma   . Diabetes mellitus type 2, insulin dependent ([place])   . Fibromyalgia   . Full dentures   . GERD (gastroesophageal reflux disease)   . H/O hiatal hernia   . Headache    h/o migraines - was followed for a time with a wellness doctor  . History of esophageal dilatation   . History of rhabdomyolysis    03/ 2015  . Hyperlipidemia   . Hypertension   . Insulin pump in place    since 12/ 2014--  [employer]  . Osteoarthritis of left knee, primary localized 08/17/2014  . Renal insufficiency   . SUI (stress urinary incontinence, female)   . Wears glasses      Family History  Problem Relation Age of Onset  . Diabetes Mother   . Hypertension Mother   . Lung cancer Mother        smoked  .  Diabetes Father   . Hypertension Father   . Lung cancer Father        smoked  . Diabetes Brother   . Heart failure Sister   . Diabetes Maternal Grandmother   . Cancer Maternal Grandmother      Current Outpatient Medications:  .  amLODipine (NORVASC) 10 MG tablet, Take 1 tablet (10 mg total) by mouth at bedtime., Disp: 90 tablet, Rfl: 0 .  Continuous Blood Gluc Receiver (FREESTYLE LIBRE READER) DEVI, by Does not apply route. CGM device-Freestyle Libre, Disp: , Rfl:  .  Continuous Blood Gluc Sensor (FREESTYLE LIBRE 14 DAY SENSOR) MISC, by Does not apply route. CGM 14-day sensors; send refills to [facility] (in [place]), Disp: , Rfl:  .  docusate sodium (COLACE) 100 MG capsule, Take 100 mg by mouth 2 (two) times daily as needed for mild constipation., Disp: , Rfl:  .  gabapentin (NEURONTIN) 300 MG capsule, Take 1 capsule (300 mg total) by mouth 3 (three) times daily., Disp: 90 capsule, Rfl: 3 .  insulin lispro (HUMALOG) 100 UNIT/ML injection, Inject 0.25-0.35 mLs (25-35 Units total) into the skin See admin instructions. Inject 25 units subcutaneously before breakfast and supper, inject 35 units before lunch, Disp: 40 mL, Rfl: 2 .  Lancets Misc. MISC, 1 each by Does not apply route 4 (four) times daily -  with meals and  at bedtime., Disp: 300 each, Rfl: 3 .  losartan (COZAAR) 50 MG tablet, Take 1 tablet (50 mg total) by mouth daily., Disp: 90 tablet, Rfl: 3 .  lubiprostone (AMITIZA) 8 MCG capsule, Take 1 capsule (8 mcg total) by mouth daily with breakfast., Disp: 90 capsule, Rfl: 1 .  mirabegron ER (MYRBETRIQ) 50 MG TB24 tablet, Take 1 tablet (50 mg total) by mouth daily., Disp: 90 tablet, Rfl: 0 .  Multiple Vitamin (MULTIVITAMIN WITH MINERALS) TABS tablet, Take 1 tablet by mouth daily., Disp: , Rfl:  .  naproxen sodium (ALEVE) 220 MG tablet, Take 220 mg by mouth 2 (two) times daily as needed (pain)., Disp: , Rfl:  .  Nebivolol HCl (BYSTOLIC) 20 MG TABS, Take 1 tablet (20 mg total) by mouth  daily., Disp: 90 tablet, Rfl: 1 .  pantoprazole (PROTONIX) 40 MG tablet, Take 30- 60 min before your first and last meals of the day (Patient taking differently: Take 40 mg by mouth daily before breakfast. ), Disp: 60 tablet, Rfl: 11 .  pravastatin (PRAVACHOL) 80 MG tablet, Take 0.5 tablets (40 mg total) by mouth daily., Disp: 90 tablet, Rfl: 2 .  Semaglutide, 1 MG/DOSE, (OZEMPIC, 1 MG/DOSE,) 2 MG/1.5ML SOPN, Inject 1 mg into the skin every Thursday., Disp: 3 pen, Rfl: 3 .  spironolactone (ALDACTONE) 50 MG tablet, Take 1 tablet (50 mg total) by mouth daily., Disp: 90 tablet, Rfl: 0 .  Vitamin D, Ergocalciferol, (DRISDOL) 1.25 MG (50000 UT) CAPS capsule, TAKE 1 CAPSULE BY MOUTH EVERY 7 DAYS ON WEDNESDAY, Disp: 5 capsule, Rfl: 0 .  albuterol (PROAIR HFA) 108 (90 Base) MCG/ACT inhaler, 2 puffs every 4 hours as needed only  if your can't catch your breath (Patient not taking: Reported on 09/03/2019), Disp: 1 Inhaler, Rfl: 11 .  nitrofurantoin (MACRODANTIN) 100 MG capsule, Take 100 mg by mouth daily., Disp: , Rfl:    Allergies  Allergen Reactions  . Pollen Extract Other (See Comments)    Congestion/sneezing  . Tomato Hives and Itching  . Codeine Hives, Itching and Nausea And Vomiting     Review of Systems  Constitutional: Negative.   Eyes: Negative for blurred vision.  Respiratory: Negative.   Cardiovascular: Negative for chest pain, palpitations and leg swelling.  Gastrointestinal: Positive for constipation. Negative for abdominal pain.  Neurological: Negative.   Psychiatric/Behavioral: Negative.      Today's Vitals   09/03/19 1609  BP: 120/68  Pulse: 98  Temp: 98.2 F (36.8 C)  Weight: 152 lb 12.8 oz (69.3 kg)  Height: 5' 5.6" (1.666 m)   Body mass index is 24.96 kg/m.   Objective:  Physical Exam Vitals reviewed.  Constitutional:      Appearance: Normal appearance.  Cardiovascular:     Rate and Rhythm: Normal rate and regular rhythm.     Pulses: Normal pulses.     Heart  sounds: Normal heart sounds. No murmur.  Pulmonary:     Effort: Pulmonary effort is normal. No respiratory distress.     Breath sounds: Normal breath sounds.  Abdominal:     General: Bowel sounds are normal. There is no distension.     Palpations: Abdomen is soft.     Tenderness: There is no abdominal tenderness.  Neurological:     General: No focal deficit present.     Mental Status: She is alert and oriented to person, place, and time.     Cranial Nerves: No cranial nerve deficit.  Psychiatric:  Mood and Affect: Mood normal.        Behavior: Behavior normal.        Thought Content: Thought content normal.        Judgment: Judgment normal.         Assessment And Plan:     1. Uncontrolled type 2 diabetes mellitus with hyperglycemia ([place])  She has been having some highs so will increase her lunch time dose - insulin lispro (HUMALOG) 100 UNIT/ML injection; Inject 0.25-0.35 mLs (25-35 Units total) into the skin See admin instructions. Inject 30 units subcutaneously before breakfast, inject 35 units before lunch and 25 units before supper  Dispense: 40 mL; Refill: 2  2. Constipation, unspecified constipation type  She has good relieve with Mariel Sleet, FNP    THE PATIENT IS ENCOURAGED TO PRACTICE SOCIAL DISTANCING DUE TO THE COVID-19 PANDEMIC.

## 2019-09-04 DIAGNOSIS — E118 Type 2 diabetes mellitus with unspecified complications: Secondary | ICD-10-CM | POA: Diagnosis not present

## 2019-09-04 DIAGNOSIS — Z794 Long term (current) use of insulin: Secondary | ICD-10-CM | POA: Diagnosis not present

## 2019-09-05 ENCOUNTER — Other Ambulatory Visit: Payer: Self-pay | Admitting: Nurse Practitioner

## 2019-09-14 ENCOUNTER — Encounter: Payer: Self-pay | Admitting: Internal Medicine

## 2019-09-16 ENCOUNTER — Ambulatory Visit (INDEPENDENT_AMBULATORY_CARE_PROVIDER_SITE_OTHER): Payer: Medicare Other | Admitting: Pharmacist

## 2019-09-16 DIAGNOSIS — E1165 Type 2 diabetes mellitus with hyperglycemia: Secondary | ICD-10-CM

## 2019-09-16 NOTE — Patient Instructions (Addendum)
Visit Information  Goals Addressed            This Visit's Progress     Patient Stated   . I want to control my diabetes (pt-stated)       Current Barriers:  [name] Knowledge Deficits related to diabetes diet & lifestyle modifications . Non Adherence to prescribed medication regimen  Pharmacist Clinical Goal(s):  [name] Over the next 90 days, patient will work with PharmD and PCP to address needs related to optimized medication management of chronic conditions  Goal Met . New - 07/17/19 - Over the next 60 days, patient will experience daily glycemic control to be within target range of 80-130  . New - 07/17/19 - Over the next 90 days, patient will lower her A1C <8.7  Interventions: . Comprehensive medication review performed.  Reviewed medication fill history via insurance claims data. . Reviewed & discussed the following diabetes-related information with patient: o Encouraged patient to continue checking blood sugars as directed-->patient now has scanner glucometer (FreeStyle [place]), will be able to download data at PCP office during visits.  - She has been without libre supplies x1 week.  She has received refill and reports BGs are improving. o Confirmed current DM regimen: Humalog 30u breakfast, 35u lunch, 25u dinner; Ozempic 35m weekly.  Patient reports she is content with this regimen and is tolerating it well. (increased meal time due to higher post prandials) o Reviewed medication purpose/side effects-->patient denies adverse events, denies hypoglycemia, reports FBG<150.  Denies BG>200.  She has enjoyed scanning her arm for BG vs. Lancet method.   o Continue to follow ADA recommended "diabetes-friendly" diet  (reviewed healthy snack/food options).  Counseled patient to avoid sugary drinks and foods.  She is working to decrease potato & carbohydrates.  She is also able to walk more because she has had her bladder procedure.  Her bladder condition is improving. o Most recent A1c is 9.3% on  07/22/19 (was 8.7% on 03/19/19, still decreased from 12.0 in March 2020) o Anti-hypertensive regimen: amlodipine, losartan (no fill data) o Anti-hyperlipidemic regimen: pravastatin - Discussed the benefits of taking statin in diabetes.  Patient states she will try pravastatin daily as she has not had statin intolerance in the past.  Refill called into [facility] on 06/15/2019. Continue taking all medications as prescribed by provider.   CCM RN CM Interventions: 07/22/19 call completed with patient   . Received inbound call from patient stating she has not received her LE[employer] Legatosupply shipment containing her supply's and replacement arm patch . Discussed the manufacturer is needing clinical documentation from the PCP provider but the patient is not sure what is needed at this time . Discussed Ms. LInnocentwill use her manual glucometer until her replacement is received . Sent in basket message to embedded Pharm D JLottie Dawsonrequesting she follow up with patient next week to offer assistance . Evaluation of current treatment plan related to Diabetes and patient's adherence to plan as established by provider. . Provided education to patient re: current A1C 8.7 obtained on 03/19/19; discussed target A1C <7.0; Reviewed Self health measures taken by patient including following an ADA diet, and taking her medications as prescribed; patient states she is trying to stay active but does not have a daily structured exercise routine at this time due to experiencing several ongoing health related issues; discussed these conditions are being monitored by MD accordingly . Reviewed medications with patient and discussed patient is tolerating Humalog 25u breakfast, 35u lunch, 25u dinner, Ozempic  1 mg weekly. Reviewed adherence to taking Statin for hyperlipidemic control and antihypertensive control with Amlodipine and Losartan.  . Discussed plans with patient for ongoing care management follow up and provided patient with  direct contact information for care management team . Provided patient with printed educational materials related to Diabetes Management Zone Safety Tool . Advised patient, providing education and rationale, to check cbg before meals daily and record, calling the CCM team and or PCP for findings outside established parameters.  Discussed patient continues to use her Libre freestyle scanner system and feels she is better able to manage her DM with this meter in place   Patient Self Care Activities:  . Self administers medications as prescribed . Attends all scheduled provider appointments . Calls pharmacy for medication refills . Performs ADL's independently . Calls provider office for new concerns or questions  Please see past updates related to this goal by clicking on the "Past Updates" button in the selected goal         The patient verbalized understanding of instructions provided today and declined a print copy of patient instruction materials.   The care management team will reach out to the patient again over the next 45 days.   SIGNATURE Regina Eck, PharmD, BCPS Clinical Pharmacist, [facility] [place]: [phone]  Visit Information  Goals Addressed            This Visit's Progress     Patient Stated   . I want to control my diabetes (pt-stated)       Current Barriers:  [name] Knowledge Deficits related to diabetes diet & lifestyle modifications . Non Adherence to prescribed medication regimen  Pharmacist Clinical Goal(s):  [name] Over the next 90 days, patient will work with PharmD and PCP to address needs related to optimized medication management of chronic conditions  Goal Met . New - 07/17/19 - Over the next 60 days, patient will experience daily glycemic control to be within target range of 80-130  . New - 07/17/19 - Over the next 90 days, patient will lower her A1C  <8.7  Interventions: . Comprehensive medication review performed.  Reviewed medication fill history via insurance claims data. . Reviewed & discussed the following diabetes-related information with patient: o Encouraged patient to continue checking blood sugars as directed-->patient now has scanner glucometer (FreeStyle [place]), will be able to download data at PCP office during visits.  - She has been without libre supplies x1 week.  She has received refill and reports BGs are improving. o Confirmed current DM regimen: Humalog 30u breakfast, 35u lunch, 25u dinner; Ozempic 34m weekly.  Patient reports she is content with this regimen and is tolerating it well. (increased meal time due to higher post prandials) o Reviewed medication purpose/side effects-->patient denies adverse events, denies hypoglycemia, reports FBG<150.  Denies BG>200.  She has enjoyed scanning her arm for BG vs. Lancet method.   o Continue to follow ADA recommended "diabetes-friendly" diet  (reviewed healthy snack/food options).  Counseled patient to avoid sugary drinks and foods.  She is working to decrease potato & carbohydrates.  She is also able to walk more because she has had her bladder procedure.  Her bladder condition is improving. o Most recent A1c is 9.3% on 07/22/19 (was 8.7% on 03/19/19, still decreased from 12.0 in March 2020) o Anti-hypertensive regimen: amlodipine, losartan (no fill data) o Anti-hyperlipidemic regimen: pravastatin - Discussed the benefits of taking statin in  diabetes.  Patient states she will try pravastatin daily as she has not had statin intolerance in the past.  Refill called into [facility] on 06/15/2019. Continue taking all medications as prescribed by provider.   CCM RN CM Interventions: 07/22/19 call completed with patient   . Received inbound call from patient stating she has not received her [employer] supply shipment containing her supply's and replacement arm patch . Discussed the  manufacturer is needing clinical documentation from the PCP provider but the patient is not sure what is needed at this time . Discussed Ms. Glendenning will use her manual glucometer until her replacement is received . Sent in basket message to embedded Pharm D Lottie Dawson requesting she follow up with patient next week to offer assistance . Evaluation of current treatment plan related to Diabetes and patient's adherence to plan as established by provider. . Provided education to patient re: current A1C 8.7 obtained on 03/19/19; discussed target A1C <7.0; Reviewed Self health measures taken by patient including following an ADA diet, and taking her medications as prescribed; patient states she is trying to stay active but does not have a daily structured exercise routine at this time due to experiencing several ongoing health related issues; discussed these conditions are being monitored by MD accordingly . Reviewed medications with patient and discussed patient is tolerating Humalog 25u breakfast, 35u lunch, 25u dinner, Ozempic 1 mg weekly. Reviewed adherence to taking Statin for hyperlipidemic control and antihypertensive control with Amlodipine and Losartan.  . Discussed plans with patient for ongoing care management follow up and provided patient with direct contact information for care management team . Provided patient with printed educational materials related to Diabetes Management Zone Safety Tool . Advised patient, providing education and rationale, to check cbg before meals daily and record, calling the CCM team and or PCP for findings outside established parameters.  Discussed patient continues to use her Libre freestyle scanner system and feels she is better able to manage her DM with this meter in place   Patient Self Care Activities:  . Self administers medications as prescribed . Attends all scheduled provider appointments . Calls pharmacy for medication refills . Performs ADL's  independently . Calls provider office for new concerns or questions  Please see past updates related to this goal by clicking on the "Past Updates" button in the selected goal         The patient verbalized understanding of instructions provided today and declined a print copy of patient instruction materials.   The care management team will reach out to the patient again over the next 45 days.   SIGNATURE Regina Eck, PharmD, BCPS Clinical Pharmacist, [facility] [place]: [phone]

## 2019-09-16 NOTE — Progress Notes (Signed)
Chronic Care Management    Visit Note  09/16/2019 Name: [name] MRN: [id number] DOB: Dec 20, 1950  Referred by: [name], DNP, FNP Reason for referral : Chronic Care Management   [name] is a 69 y.o. year old female who is a primary care patient of [name], MD. The CCM team was consulted for assistance with chronic disease management and care coordination needs related to DMII  Review of patient status, including review of consultants reports, relevant laboratory and other test results, and collaboration with appropriate care team members and the patient's provider was performed as part of comprehensive patient evaluation and provision of chronic care management services.    Patient was contacted via telephone for today's visit regarding her diabetes and chronic care management   Medications: Outpatient Encounter Medications as of 09/16/2019  Medication Sig Note  . albuterol (PROAIR HFA) 108 (90 Base) MCG/ACT inhaler 2 puffs every 4 hours as needed only  if your can't catch your breath (Patient not taking: Reported on 09/03/2019)   . amLODipine (NORVASC) 10 MG tablet Take 1 tablet (10 mg total) by mouth at bedtime.   . Continuous Blood Gluc Receiver (FREESTYLE LIBRE READER) DEVI by Does not apply route. CGM device-Freestyle Libre   . Continuous Blood Gluc Sensor (FREESTYLE LIBRE 14 DAY SENSOR) MISC by Does not apply route. CGM 14-day sensors; send refills to [facility] (in [place])   . docusate sodium (COLACE) 100 MG capsule Take 100 mg by mouth 2 (two) times daily as needed for mild constipation.   . gabapentin (NEURONTIN) 300 MG capsule Take 1 capsule (300 mg total) by mouth 3 (three) times daily.   . insulin lispro (HUMALOG) 100 UNIT/ML injection Inject 0.25-0.35 mLs (25-35 Units total) into the skin See admin instructions. Inject 30 units subcutaneously before breakfast, inject 35 units before lunch and 25 units before supper   . Lancets Misc. MISC 1  each by Does not apply route 4 (four) times daily -  with meals and at bedtime.   [name] losartan (COZAAR) 50 MG tablet Take 1 tablet (50 mg total) by mouth daily.   [name] lubiprostone (AMITIZA) 8 MCG capsule Take 1 capsule (8 mcg total) by mouth daily with breakfast.   . mirabegron ER (MYRBETRIQ) 50 MG TB24 tablet Take 1 tablet (50 mg total) by mouth daily.   . Multiple Vitamin (MULTIVITAMIN WITH MINERALS) TABS tablet Take 1 tablet by mouth daily.   . naproxen sodium (ALEVE) 220 MG tablet Take 220 mg by mouth 2 (two) times daily as needed (pain).   . Nebivolol HCl (BYSTOLIC) 20 MG TABS Take 1 tablet (20 mg total) by mouth daily.   . nitrofurantoin (MACRODANTIN) 100 MG capsule Take 100 mg by mouth daily. 04/06/2019: continuous  . pantoprazole (PROTONIX) 40 MG tablet Take 30- 60 min before your first and last meals of the day (Patient taking differently: Take 40 mg by mouth daily before breakfast. )   . pravastatin (PRAVACHOL) 80 MG tablet Take 0.5 tablets (40 mg total) by mouth daily.   . Semaglutide, 1 MG/DOSE, (OZEMPIC, 1 MG/DOSE,) 2 MG/1.5ML SOPN Inject 1 mg into the skin every Thursday.   [name] spironolactone (ALDACTONE) 50 MG tablet Take 1 tablet (50 mg total) by mouth daily.   . Vitamin D, Ergocalciferol, (DRISDOL) 1.25 MG (50000 UNIT) CAPS capsule TAKE 1 CAPSULE BY MOUTH EVERY 7 DAYS ON WEDNESDAYS   . [DISCONTINUED] pravastatin (PRAVACHOL) 40 MG tablet Take 1 tablet by mouth at bedtime.  No facility-administered encounter medications on file as of 09/16/2019.     Objective:   Goals Addressed            This Visit's Progress     Patient Stated   . I want to control my diabetes (pt-stated)       Current Barriers:  [name] Knowledge Deficits related to diabetes diet & lifestyle modifications . Non Adherence to prescribed medication regimen  Pharmacist Clinical Goal(s):  [name] Over the next 90 days, patient will work with PharmD and PCP to address needs related to optimized medication management of  chronic conditions  Goal Met . New - 07/17/19 - Over the next 60 days, patient will experience daily glycemic control to be within target range of 80-130  . New - 07/17/19 - Over the next 90 days, patient will lower her A1C <8.7  Interventions: . Comprehensive medication review performed.  Reviewed medication fill history via insurance claims data. . Reviewed & discussed the following diabetes-related information with patient: o Encouraged patient to continue checking blood sugars as directed-->patient now has scanner glucometer (FreeStyle [place]), will be able to download data at PCP office during visits.  - She has been without libre supplies x1 week.  She has received refill and reports BGs are improving. o Confirmed current DM regimen: Humalog 30u breakfast, 35u lunch, 25u dinner; Ozempic 44m weekly.  Patient reports she is content with this regimen and is tolerating it well. (increased meal time due to higher post prandials) o Reviewed medication purpose/side effects-->patient denies adverse events, denies hypoglycemia, reports FBG<150.  Denies BG>200.  She has enjoyed scanning her arm for BG vs. Lancet method.   o Continue to follow ADA recommended "diabetes-friendly" diet  (reviewed healthy snack/food options).  Counseled patient to avoid sugary drinks and foods.  She is working to decrease potato & carbohydrates.  She is also able to walk more because she has had her bladder procedure.  Her bladder condition is improving. o Most recent A1c is 9.3% on 07/22/19 (was 8.7% on 03/19/19, still decreased from 12.0 in March 2020) o Anti-hypertensive regimen: amlodipine, losartan (no fill data) o Anti-hyperlipidemic regimen: pravastatin - Discussed the benefits of taking statin in diabetes.  Patient states she will try pravastatin daily as she has not had statin intolerance in the past.  Refill called into [facility] on 06/15/2019. Continue taking all medications as prescribed by provider.   CCM RN CM  Interventions: 07/22/19 call completed with patient   . Received inbound call from patient stating she has not received her [employer]lenor Legatosupply shipment containing her supply's and replacement arm patch . Discussed the manufacturer is needing clinical documentation from the PCP provider but the patient is not sure what is needed at this time . Discussed Ms. LMucklewill use her manual glucometer until her replacement is received . Sent in basket message to embedded Pharm D JLottie Dawsonrequesting she follow up with patient next week to offer assistance . Evaluation of current treatment plan related to Diabetes and patient's adherence to plan as established by provider. . Provided education to patient re: current A1C 8.7 obtained on 03/19/19; discussed target A1C <7.0; Reviewed Self health measures taken by patient including following an ADA diet, and taking her medications as prescribed; patient states she is trying to stay active but does not have a daily structured exercise routine at this time due to experiencing several ongoing health related issues; discussed these conditions are being monitored by MD accordingly . Reviewed medications with  patient and discussed patient is tolerating Humalog 25u breakfast, 35u lunch, 25u dinner, Ozempic 1 mg weekly. Reviewed adherence to taking Statin for hyperlipidemic control and antihypertensive control with Amlodipine and Losartan.  . Discussed plans with patient for ongoing care management follow up and provided patient with direct contact information for care management team . Provided patient with printed educational materials related to Diabetes Management Zone Safety Tool . Advised patient, providing education and rationale, to check cbg before meals daily and record, calling the CCM team and or PCP for findings outside established parameters.  Discussed patient continues to use her Libre freestyle scanner system and feels she is better able to manage her DM with  this meter in place   Patient Self Care Activities:  . Self administers medications as prescribed . Attends all scheduled provider appointments . Calls pharmacy for medication refills . Performs ADL's independently . Calls provider office for new concerns or questions  Please see past updates related to this goal by clicking on the "Past Updates" button in the selected goal         Plan:   The care management team will reach out to the patient again over the next 45 days.   Provider Signature   Regina Eck, PharmD, BCPS Clinical Pharmacist, [facility] [place]: [phone]

## 2019-09-17 DIAGNOSIS — K222 Esophageal obstruction: Secondary | ICD-10-CM | POA: Diagnosis not present

## 2019-09-17 DIAGNOSIS — R1310 Dysphagia, unspecified: Secondary | ICD-10-CM | POA: Diagnosis not present

## 2019-09-23 ENCOUNTER — Encounter: Payer: Self-pay | Admitting: Internal Medicine

## 2019-09-28 ENCOUNTER — Ambulatory Visit: Payer: Self-pay | Admitting: Adult Health

## 2019-10-05 DIAGNOSIS — Z794 Long term (current) use of insulin: Secondary | ICD-10-CM | POA: Diagnosis not present

## 2019-10-05 DIAGNOSIS — E118 Type 2 diabetes mellitus with unspecified complications: Secondary | ICD-10-CM | POA: Diagnosis not present

## 2019-10-06 ENCOUNTER — Ambulatory Visit: Payer: Medicare Other | Admitting: Cardiovascular Disease

## 2019-10-06 DIAGNOSIS — R1310 Dysphagia, unspecified: Secondary | ICD-10-CM | POA: Diagnosis not present

## 2019-10-06 DIAGNOSIS — K222 Esophageal obstruction: Secondary | ICD-10-CM | POA: Diagnosis not present

## 2019-10-08 ENCOUNTER — Encounter: Payer: Self-pay | Admitting: General Practice

## 2019-10-10 DIAGNOSIS — E118 Type 2 diabetes mellitus with unspecified complications: Secondary | ICD-10-CM | POA: Diagnosis not present

## 2019-10-10 DIAGNOSIS — Z794 Long term (current) use of insulin: Secondary | ICD-10-CM | POA: Diagnosis not present

## 2019-10-11 ENCOUNTER — Other Ambulatory Visit: Payer: Self-pay | Admitting: Nurse Practitioner

## 2019-10-14 ENCOUNTER — Telehealth: Payer: Self-pay

## 2019-10-14 NOTE — Telephone Encounter (Signed)
ATT TO CONTACT PT TO ADVISE APPT CANCELLED 02/18 DUE TO WEATHER NO ANS LVM ABOUT APPT [facility]

## 2019-10-15 ENCOUNTER — Ambulatory Visit: Payer: Medicare Other | Admitting: Nurse Practitioner

## 2019-10-16 ENCOUNTER — Encounter: Payer: Self-pay | Admitting: Internal Medicine

## 2019-10-18 ENCOUNTER — Other Ambulatory Visit: Payer: Self-pay | Admitting: Internal Medicine

## 2019-10-18 ENCOUNTER — Ambulatory Visit (HOSPITAL_COMMUNITY)
Admission: EM | Admit: 2019-10-18 | Discharge: 2019-10-18 | Disposition: A | Discharge status: Home / Self Care | Payer: Medicare Other | Attending: Family Medicine | Admitting: Family Medicine

## 2019-10-18 ENCOUNTER — Encounter (HOSPITAL_COMMUNITY): Payer: Self-pay

## 2019-10-18 ENCOUNTER — Other Ambulatory Visit: Payer: Self-pay

## 2019-10-18 DIAGNOSIS — H7292 Unspecified perforation of tympanic membrane, left ear: Secondary | ICD-10-CM | POA: Diagnosis not present

## 2019-10-18 DIAGNOSIS — H9202 Otalgia, left ear: Secondary | ICD-10-CM

## 2019-10-18 MED ORDER — KETOROLAC TROMETHAMINE 30 MG/ML IJ SOLN
INTRAMUSCULAR | Status: AC
  Filled 2019-10-18: qty 1

## 2019-10-18 MED ORDER — KETOROLAC TROMETHAMINE 30 MG/ML IJ SOLN
30.0000 mg | Freq: Once | INTRAMUSCULAR | Status: AC
  Administered 2019-10-18: 13:00:00 30 mg via INTRAMUSCULAR

## 2019-10-18 NOTE — ED Triage Notes (Signed)
Pt presents with left ear pain X 4 days.  °

## 2019-10-18 NOTE — Discharge Instructions (Addendum)
You have a perforated ear drum.   You may take tylenol as needed for pain, may keep cotton in your ear for comfort, keep ear dry.  Call ENT for appt tomorrow and follow up with them.

## 2019-10-18 NOTE — ED Provider Notes (Signed)
[facility]    CSN: [id number] Arrival date & time: 10/18/19  1150      History   Chief Complaint Chief Complaint  Patient presents with  . Otalgia    Left Ear    HPI [name] is a 69 y.o. female.   Patient reports sudden onset left ear pain x4 days ago.  Reports clear watery drainage from left ear x4 days ago but only occurred the first day of pain.  Reports that noises are much louder, denies hearing loss in that ear.  Reports that she has been using a cotton ball in her ear to help muffle noises and to help keep it dry.  The history is provided by the patient.    Past Medical History:  Diagnosis Date  . Arthritis    L knee, hands, back   . Asthma   . Diabetes mellitus type 2, insulin dependent ([place])   . Fibromyalgia   . Full dentures   . GERD (gastroesophageal reflux disease)   . H/O hiatal hernia   . Headache    h/o migraines - was followed for a time with a wellness doctor  . History of esophageal dilatation   . History of rhabdomyolysis    03/ 2015  . Hyperlipidemia   . Hypertension   . Insulin pump in place    since 12/ 2014--  [employer]  . Osteoarthritis of left knee, primary localized 08/17/2014  . Renal insufficiency   . SUI (stress urinary incontinence, female)   . Wears glasses     Patient Active Problem List   Diagnosis Date Noted  . Syncope due to orthostatic hypotension 05/28/2019  . Hypertensive urgency 04/06/2019  . 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  . Wound infection 03/06/2019  . Localized swelling, mass and lump, multiple sites 03/27/2018  . Type II diabetes mellitus with renal manifestations ([place]) 12/27/2017  . HLD (hyperlipidemia) 12/27/2017  . HTN (hypertension) 12/27/2017  . CKD (chronic kidney disease), stage III 12/27/2017  . Syncope 12/27/2017  . Cough variant asthma 12/01/2015  . Osteoarthritis of left knee, primary localized 08/17/2014  . Knee osteoarthritis 08/17/2014  . Insulin pump in place 12/21/2013  .  Rhabdomyolysis 11/15/2013  . Elevated lactic acid level 11/15/2013  . Hypoglycemia 11/15/2013  . Other and unspecified hyperlipidemia 08/06/2013  . Type II diabetes mellitus, uncontrolled ([place]) 07/20/2013  . Varicose veins of lower extremities with other complications 123XX123  . Fibromyalgia   . GERD 05/18/2010  . URINARY TRACT INFECTION, MONILIAL 05/05/2010  . UTI 05/02/2010  . ABDOMINAL PAIN, GENERALIZED 05/02/2010  . OBESITY 04/20/2010  . BURSITIS, LEFT SHOULDER 03/09/2010  . CANDIDIASIS OF VULVA AND VAGINA 01/10/2010  . Lipoma of arm s/p excision 01/29/2014 01/10/2010  . DEPRESSION 12/27/2009  . BACK PAIN WITH RADICULOPATHY 12/27/2009  . CYSTITIS, ACUTE 10/21/2009  . MICROSCOPIC HEMATURIA 10/18/2009  . KNEE PAIN, BILATERAL 10/18/2009  . NEUROPATHY 09/08/2009  . HYPERTENSION, BENIGN ESSENTIAL 09/08/2009  . Asthma 09/08/2009  . STRESS INCONTINENCE 09/08/2009  . CHEST PAIN 07/19/2009  . ESOPHAGEAL STRICTURE 08/27/2005  . Diabetes mellitus without complication ([place]) 123XX123    Past Surgical History:  Procedure Laterality Date  . BLADDER SUSPENSION N/A 03/09/2014   Procedure: CYSTOSCOPY/SLING;  Surgeon: Reece Packer, MD;  Location: [facility];  Service: Urology;  Laterality: N/A;  . CARDIAC CATHETERIZATION  07-20-2009   DR Shelva Majestic   MODERATE LVH/  NORMAL  LVEF/  NORMAL CORONARY AND RENAL ARTERIES  . CARPAL TUNNEL RELEASE Right  [place]  . COLONOSCOPY WITH ESOPHAGOGASTRODUODENOSCOPY (EGD)  06-18-2002  . ESOPHAGOGASTRODUODENOSCOPY (EGD) WITH ESOPHAGEAL DILATION  06-12-2010  . EXCISION LEFT UPPER ARM MASS  01-29-2014  . EYE SURGERY  2010   laser on R eye  . INTERSTIM IMPLANT REMOVAL N/A 03/06/2019   Procedure: REMOVAL OF INTERSTIM IMPLANT;  Surgeon: Bjorn Loser, MD;  Location: [facility];  Service: Urology;  Laterality: N/A;  . KNEE ARTHROSCOPY Right 1989  &  2002  . NEGATIVE SLEEP STUDY  2012   PER PT  . PARTIAL KNEE  ARTHROPLASTY Left 08/17/2014   Procedure: LEFT UNICOMPARTMENTAL KNEE;  Surgeon: Johnny Bridge, MD;  Location: [facility];  Service: Orthopedics;  Laterality: Left;    OB History    Gravida  1   Para  1   Term  1   Preterm      AB      Living  1     SAB      TAB      Ectopic      Multiple      Live Births  1            Home Medications    Prior to Admission medications   Medication Sig Start Date End Date Taking? Authorizing Provider  albuterol (PROAIR HFA) 108 (90 Base) MCG/ACT inhaler 2 puffs every 4 hours as needed only  if your can't catch your breath Patient not taking: Reported on 09/03/2019 01/20/16   Tanda Rockers, MD  amLODipine (NORVASC) 10 MG tablet Take 1 tablet (10 mg total) by mouth at bedtime. 06/15/19   [name], MD  Continuous Blood Gluc Receiver (FREESTYLE LIBRE READER) DEVI by Does not apply route. CGM device-[employer]estyle [employer], Historical, MD  Continuous Blood Gluc Sensor (FREESTYLE LIBRE 14 DAY SENSOR) MISC by Does not apply route. CGM 14-day sensors; send refills to [employer] [facility] (in [place])    [provider]  docusate sodium (COLACE) 100 MG capsule Take 100 mg by mouth 2 (two) times daily as needed for mild constipation.    [provider]  gabapentin (NEURONTIN) 300 MG capsule Take 1 capsule (300 mg total) by mouth 3 (three) times daily. 06/15/19   [name], MD  insulin lispro (HUMALOG) 100 UNIT/ML injection Inject 0.25-0.35 mLs (25-35 Units total) into the skin See admin instructions. Inject 30 units subcutaneously before breakfast, inject 35 units before lunch and 25 units before supper 09/03/19   [name], FNP  Lancets Misc. MISC 1 each by Does not apply route 4 (four) times daily -  with meals and at bedtime. 12/16/18   [name], FNP  losartan (COZAAR) 50 MG tablet Take 1 tablet (50 mg total) by mouth daily. 11/11/15   Elayne Snare, MD  lubiprostone (AMITIZA) 8 MCG capsule Take 1 capsule (8 mcg total)  by mouth daily with breakfast. 07/22/19   [name], FNP  mirabegron ER (MYRBETRIQ) 50 MG TB24 tablet Take 1 tablet (50 mg total) by mouth daily. 05/27/19   [name], FNP  Multiple Vitamin (MULTIVITAMIN WITH MINERALS) TABS tablet Take 1 tablet by mouth daily.    [provider]  naproxen sodium (ALEVE) 220 MG tablet Take 220 mg by mouth 2 (two) times daily as needed (pain).    [provider]  Nebivolol HCl (BYSTOLIC) 20 MG TABS Take 1 tablet (20 mg total) by mouth daily. 11/20/18   [name], FNP  nitrofurantoin (MACRODANTIN) 100 MG capsule  Take 100 mg by mouth daily. 03/16/19   [provider]  pantoprazole (PROTONIX) 40 MG tablet Take 30- 60 min before your first and last meals of the day Patient taking differently: Take 40 mg by mouth daily before breakfast.  12/01/15   Tanda Rockers, MD  pravastatin (PRAVACHOL) 80 MG tablet Take 0.5 tablets (40 mg total) by mouth daily. 06/15/19   [name], MD  Semaglutide, 1 MG/DOSE, (OZEMPIC, 1 MG/DOSE,) 2 MG/1.5ML SOPN Inject 1 mg into the skin every Thursday. 06/18/19   [name], MD  spironolactone (ALDACTONE) 50 MG tablet Take 1 tablet (50 mg total) by mouth daily. 06/15/19   [name], MD  Vitamin D, Ergocalciferol, (DRISDOL) 1.25 MG (50000 UNIT) CAPS capsule TAKE 1 CAPSULE BY MOUTH EVERY 7 DAYS ON [facility] 10/12/19   [name], FNP    Family History Family History  Problem Relation Age of Onset  . Diabetes Mother   . Hypertension Mother   . Lung cancer Mother        smoked  . Diabetes Father   . Hypertension Father   . Lung cancer Father        smoked  . Diabetes Brother   . Heart failure Sister   . Diabetes Maternal Grandmother   . Cancer Maternal Grandmother     Social History Social History   Tobacco Use  . Smoking status: Never Smoker  . Smokeless tobacco: Never Used  Substance Use Topics  . Alcohol use: No  . Drug use: No     Allergies   Pollen extract, Tomato, and  Codeine   Review of Systems Review of Systems   Physical Exam Triage Vital Signs ED Triage Vitals  Enc Vitals Group     BP 10/18/19 1203 106/67     Pulse Rate 10/18/19 1203 94     Resp 10/18/19 1203 18     Temp 10/18/19 1203 98.1 F (36.7 C)     Temp Source 10/18/19 1203 Oral     SpO2 10/18/19 1203 98 %     Weight --      Height --      Head Circumference --      Peak Flow --      Pain Score 10/18/19 1208 8     Pain Loc --      Pain Edu? --      Excl. in [place]? --    No data found.  Updated Vital Signs BP 106/67 (BP Location: Left Arm)   Pulse 94   Temp 98.1 F (36.7 C) (Oral)   Resp 18   LMP  (LMP Unknown)   SpO2 98%    Physical Exam Vitals and nursing note reviewed.  Constitutional:      General: She is not in acute distress.    Appearance: Normal appearance. She is well-developed.  HENT:     Head: Normocephalic and atraumatic.     Right Ear: Tympanic membrane normal.     Ears:     Comments: Left eardrum clearly perforated.  No well-defined edges.    Nose: Nose normal.     Mouth/Throat:     Mouth: Mucous membranes are moist.  Eyes:     Conjunctiva/sclera: Conjunctivae normal.  Cardiovascular:     Rate and Rhythm: Normal rate and regular rhythm.     Heart sounds: Normal heart sounds. No murmur.  Pulmonary:     Effort: Pulmonary effort is normal. No respiratory distress.     Breath sounds:  Normal breath sounds.  Abdominal:     General: Bowel sounds are normal.     Palpations: Abdomen is soft.     Tenderness: There is no abdominal tenderness.  Musculoskeletal:     Cervical back: Neck supple.  Skin:    General: Skin is warm and dry.     Capillary Refill: Capillary refill takes less than 2 seconds.  Neurological:     General: No focal deficit present.     Mental Status: She is alert and oriented to person, place, and time.  Psychiatric:        Mood and Affect: Mood normal.        Behavior: Behavior normal.      UC Treatments / Results   Labs (all labs ordered are listed, but only abnormal results are displayed) Labs Reviewed - No data to display  EKG   Radiology No results found.  Procedures Procedures (including critical care time)  Medications Ordered in UC Medications  ketorolac (TORADOL) 30 MG/ML injection 30 mg (30 mg Intramuscular Given 10/18/19 1248)    Initial Impression / Assessment and Plan / UC Course  I have reviewed the triage vital signs and the nursing notes.  Pertinent labs & imaging results that were available during my care of the patient were reviewed by me and considered in my medical decision making (see chart for details).     Left TM perforated.  Pain to the left ear.  30 mg Toradol IM given in office today.  Instructed that she can take naproxen or Tylenol at home for pain control.  Given information for ENT, to follow-up with them and give them a call tomorrow. Final Clinical Impressions(s) / UC Diagnoses   Final diagnoses:  Otalgia of left ear  Perforated ear drum, left     Discharge Instructions     You have a perforated ear drum.   You may take tylenol as needed for pain, may keep cotton in your ear for comfort, keep ear dry.  Call ENT for appt tomorrow and follow up with them.    ED Prescriptions    None     I have reviewed the PDMP during this encounter.   Faustino Congress, NP 10/18/19 1356

## 2019-10-20 DIAGNOSIS — K222 Esophageal obstruction: Secondary | ICD-10-CM | POA: Diagnosis not present

## 2019-10-20 DIAGNOSIS — R1310 Dysphagia, unspecified: Secondary | ICD-10-CM | POA: Diagnosis not present

## 2019-10-22 ENCOUNTER — Ambulatory Visit (INDEPENDENT_AMBULATORY_CARE_PROVIDER_SITE_OTHER): Payer: Medicare Other | Admitting: Nurse Practitioner

## 2019-10-22 ENCOUNTER — Other Ambulatory Visit: Payer: Self-pay

## 2019-10-22 ENCOUNTER — Encounter: Payer: Self-pay | Admitting: Nurse Practitioner

## 2019-10-22 VITALS — BP 124/82 | HR 80 | Temp 98.0°F | Ht 65.6 in | Wt 153.6 lb

## 2019-10-22 DIAGNOSIS — K222 Esophageal obstruction: Secondary | ICD-10-CM

## 2019-10-22 DIAGNOSIS — R413 Other amnesia: Secondary | ICD-10-CM | POA: Diagnosis not present

## 2019-10-22 DIAGNOSIS — N1831 Chronic kidney disease, stage 3a: Secondary | ICD-10-CM

## 2019-10-22 DIAGNOSIS — E1121 Type 2 diabetes mellitus with diabetic nephropathy: Secondary | ICD-10-CM | POA: Diagnosis not present

## 2019-10-22 DIAGNOSIS — R5383 Other fatigue: Secondary | ICD-10-CM | POA: Diagnosis not present

## 2019-10-22 DIAGNOSIS — E785 Hyperlipidemia, unspecified: Secondary | ICD-10-CM | POA: Diagnosis not present

## 2019-10-22 DIAGNOSIS — I10 Essential (primary) hypertension: Secondary | ICD-10-CM

## 2019-10-22 DIAGNOSIS — I129 Hypertensive chronic kidney disease with stage 1 through stage 4 chronic kidney disease, or unspecified chronic kidney disease: Secondary | ICD-10-CM

## 2019-10-22 DIAGNOSIS — Z794 Long term (current) use of insulin: Secondary | ICD-10-CM | POA: Diagnosis not present

## 2019-10-22 DIAGNOSIS — E559 Vitamin D deficiency, unspecified: Secondary | ICD-10-CM

## 2019-10-22 NOTE — Progress Notes (Signed)
This visit occurred during the SARS-CoV-2 public health emergency.  Safety protocols were in place, including screening questions prior to the visit, additional usage of staff PPE, and extensive cleaning of exam room while observing appropriate contact time as indicated for disinfecting solutions.  Subjective:     Patient ID: [name] , female    DOB: 04-02-1951 , 69 y.o.   MRN: [id number]   Chief Complaint  Patient presents with  . Diabetes    HPI  She is scheduled to see the bladder provider in April. She continues to have low energy.    Diabetes She presents for her follow-up diabetic visit. She has type 2 diabetes mellitus. There are no hypoglycemic associated symptoms. Pertinent negatives for diabetes include no blurred vision and no chest pain. There are no diabetic complications. She is following a generally unhealthy diet. When asked about meal planning, she reported none. She has not had a previous visit with a dietitian. She rarely participates in exercise. (Blood sugars are better controlled highest has been 170. She is drinking more water at least 64 oz. ) She does not see a podiatrist.Eye exam is current.  Hypertension This is a chronic problem. The current episode started more than 1 year ago. The problem is unchanged. The problem is controlled. Pertinent negatives include no anxiety, blurred vision, chest pain or palpitations. There are no associated agents to hypertension. Risk factors for coronary artery disease include diabetes mellitus and sedentary lifestyle. There are no compliance problems.      Past Medical History:  Diagnosis Date  . Arthritis    L knee, hands, back   . Asthma   . Diabetes mellitus type 2, insulin dependent ([place])   . Fibromyalgia   . Full dentures   . GERD (gastroesophageal reflux disease)   . H/O hiatal hernia   . Headache    h/o migraines - was followed for a time with a wellness doctor  . History of esophageal dilatation   .  History of rhabdomyolysis    03/ 2015  . Hyperlipidemia   . Hypertension   . Insulin pump in place    since 12/ 2014--  [facility]  . Osteoarthritis of left knee, primary localized 08/17/2014  . Renal insufficiency   . SUI (stress urinary incontinence, female)   . Wears glasses      Family History  Problem Relation Age of Onset  . Diabetes Mother   . Hypertension Mother   . Lung cancer Mother        smoked  . Diabetes Father   . Hypertension Father   . Lung cancer Father        smoked  . Diabetes Brother   . Heart failure Sister   . Diabetes Maternal Grandmother   . Cancer Maternal Grandmother      Current Outpatient Medications:  .  albuterol (PROAIR HFA) 108 (90 Base) MCG/ACT inhaler, 2 puffs every 4 hours as needed only  if your can't catch your breath, Disp: 1 Inhaler, Rfl: 11 .  amLODipine (NORVASC) 10 MG tablet, Take 1 tablet (10 mg total) by mouth at bedtime., Disp: 90 tablet, Rfl: 0 .  Continuous Blood Gluc Receiver (FREESTYLE LIBRE READER) DEVI, by Does not apply route. CGM device-Freestyle Libre, Disp: , Rfl:  .  Continuous Blood Gluc Sensor (FREESTYLE LIBRE 14 DAY SENSOR) MISC, by Does not apply route. CGM 14-day sensors; send refills to [facility] (in Epic), Disp: , Rfl:  .  docusate sodium (COLACE) 100  MG capsule, Take 100 mg by mouth 2 (two) times daily as needed for mild constipation., Disp: , Rfl:  .  gabapentin (NEURONTIN) 300 MG capsule, TAKE 1 CAPSULE(300 MG) BY MOUTH THREE TIMES DAILY, Disp: 90 capsule, Rfl: 1 .  insulin lispro (HUMALOG) 100 UNIT/ML injection, Inject 0.25-0.35 mLs (25-35 Units total) into the skin See admin instructions. Inject 30 units subcutaneously before breakfast, inject 35 units before lunch and 25 units before supper, Disp: 40 mL, Rfl: 2 .  Lancets Misc. MISC, 1 each by Does not apply route 4 (four) times daily -  with meals and at bedtime., Disp: 300 each, Rfl: 3 .  losartan (COZAAR) 50 MG tablet, Take 1 tablet (50 mg total) by  mouth daily., Disp: 90 tablet, Rfl: 3 .  lubiprostone (AMITIZA) 8 MCG capsule, Take 1 capsule (8 mcg total) by mouth daily with breakfast., Disp: 90 capsule, Rfl: 1 .  mirabegron ER (MYRBETRIQ) 50 MG TB24 tablet, Take 1 tablet (50 mg total) by mouth daily., Disp: 90 tablet, Rfl: 0 .  Multiple Vitamin (MULTIVITAMIN WITH MINERALS) TABS tablet, Take 1 tablet by mouth daily., Disp: , Rfl:  .  naproxen sodium (ALEVE) 220 MG tablet, Take 220 mg by mouth 2 (two) times daily as needed (pain)., Disp: , Rfl:  .  Nebivolol HCl (BYSTOLIC) 20 MG TABS, Take 1 tablet (20 mg total) by mouth daily., Disp: 90 tablet, Rfl: 1 .  nitrofurantoin (MACRODANTIN) 100 MG capsule, Take 100 mg by mouth daily., Disp: , Rfl:  .  pantoprazole (PROTONIX) 40 MG tablet, Take 30- 60 min before your first and last meals of the day (Patient taking differently: Take 40 mg by mouth daily before breakfast. ), Disp: 60 tablet, Rfl: 11 .  pravastatin (PRAVACHOL) 80 MG tablet, Take 0.5 tablets (40 mg total) by mouth daily., Disp: 90 tablet, Rfl: 2 .  Semaglutide, 1 MG/DOSE, (OZEMPIC, 1 MG/DOSE,) 2 MG/1.5ML SOPN, Inject 1 mg into the skin every Thursday., Disp: 3 pen, Rfl: 3 .  spironolactone (ALDACTONE) 50 MG tablet, Take 1 tablet (50 mg total) by mouth daily., Disp: 90 tablet, Rfl: 0 .  Vitamin D, Ergocalciferol, (DRISDOL) 1.25 MG (50000 UNIT) CAPS capsule, TAKE 1 CAPSULE BY MOUTH EVERY 7 DAYS ON WEDNESDAYS, Disp: 5 capsule, Rfl: 0   Allergies  Allergen Reactions  . Pollen Extract Other (See Comments)    Congestion/sneezing  . Tomato Hives and Itching  . Codeine Hives, Itching and Nausea And Vomiting     Review of Systems  Constitutional: Negative.   Eyes: Negative for blurred vision.  Respiratory: Negative.   Cardiovascular: Negative for chest pain, palpitations and leg swelling.  Neurological: Negative.   Psychiatric/Behavioral: Negative.      Today's Vitals   10/22/19 1534  BP: 124/82  Pulse: 80  Temp: 98 F (36.7 C)   Weight: 153 lb 9.6 oz (69.7 kg)  Height: 5' 5.6" (1.666 m)   Body mass index is 25.09 kg/m.   Objective:  Physical Exam Vitals reviewed.  Constitutional:      Appearance: Normal appearance.  Cardiovascular:     Rate and Rhythm: Normal rate and regular rhythm.     Pulses: Normal pulses.     Heart sounds: Normal heart sounds. No murmur.  Pulmonary:     Effort: Pulmonary effort is normal. No respiratory distress.     Breath sounds: Normal breath sounds.  Abdominal:     General: Bowel sounds are normal. There is no distension.     Palpations: Abdomen  is soft.     Tenderness: There is no abdominal tenderness.  Neurological:     General: No focal deficit present.     Mental Status: She is alert and oriented to person, place, and time.     Cranial Nerves: No cranial nerve deficit.  Psychiatric:        Mood and Affect: Mood normal.        Behavior: Behavior normal.        Thought Content: Thought content normal.        Judgment: Judgment normal.         Assessment And Plan:     1. Hyperlipidemia, unspecified hyperlipidemia type  Chronic, controlled  Continue with current medications - CMP14+EGFR - Lipid panel  2. Type 2 diabetes mellitus with stage 3a chronic kidney disease, with long-term current use of insulin (HCC)  Chronic, controlled  Continue with current medications  Encouraged to limit intake of sugary foods and drinks  Encouraged to increase physical activity to 150 minutes per week - CMP14+EGFR - Hemoglobin A1c  3. Low energy  Will check vitamin B12 - Vitamin B12  4. ESOPHAGEAL STRICTURE  She has had her esophagus stretched 4 times and has two more times to do feels has been effective she is able to take her cholesterol medications better  5. Essential hypertension  Chronic, good control  Continue with current medications  6. Vitamin D deficiency  Will check vitamin D level and supplement as needed.     Also encouraged to spend 15  minutes in the sun daily.  - VITAMIN D 25 Hydroxy (Vit-D Deficiency, Fractures)     [name], FNP    THE PATIENT IS ENCOURAGED TO PRACTICE SOCIAL DISTANCING DUE TO THE COVID-19 PANDEMIC.

## 2019-10-23 ENCOUNTER — Other Ambulatory Visit: Payer: Self-pay | Admitting: Nurse Practitioner

## 2019-10-23 DIAGNOSIS — K5900 Constipation, unspecified: Secondary | ICD-10-CM

## 2019-10-23 LAB — CMP14+EGFR
ALT: 9 IU/L (ref 0–32)
AST: 16 IU/L (ref 0–40)
Albumin/Globulin Ratio: 1.6 (ref 1.2–2.2)
Albumin: 4.1 g/dL (ref 3.8–4.8)
Alkaline Phosphatase: 110 IU/L (ref 39–117)
BUN/Creatinine Ratio: 10 — ABNORMAL LOW (ref 12–28)
BUN: 13 mg/dL (ref 8–27)
Bilirubin Total: 0.6 mg/dL (ref 0.0–1.2)
CO2: 26 mmol/L (ref 20–29)
Calcium: 9.6 mg/dL (ref 8.7–10.3)
Chloride: 104 mmol/L (ref 96–106)
Creatinine, Ser: 1.28 mg/dL — ABNORMAL HIGH (ref 0.57–1.00)
GFR calc Af Amer: 50 mL/min/{1.73_m2} — ABNORMAL LOW (ref >59)
GFR calc non Af Amer: 43 mL/min/{1.73_m2} — ABNORMAL LOW (ref >59)
Globulin, Total: 2.6 g/dL (ref 1.5–4.5)
Glucose: 173 mg/dL — ABNORMAL HIGH (ref 65–99)
Potassium: 4.9 mmol/L (ref 3.5–5.2)
Sodium: 143 mmol/L (ref 134–144)
Total Protein: 6.7 g/dL (ref 6.0–8.5)

## 2019-10-23 LAB — LIPID PANEL
Chol/HDL Ratio: 3.1 ratio (ref 0.0–4.4)
Cholesterol, Total: 171 mg/dL (ref 100–199)
HDL: 55 mg/dL (ref >39)
LDL Chol Calc (NIH): 95 mg/dL (ref 0–99)
Triglycerides: 117 mg/dL (ref 0–149)
VLDL Cholesterol Cal: 21 mg/dL (ref 5–40)

## 2019-10-23 LAB — VITAMIN D 25 HYDROXY (VIT D DEFICIENCY, FRACTURES): Vit D, 25-Hydroxy: 29.8 ng/mL — ABNORMAL LOW (ref 30.0–100.0)

## 2019-10-23 LAB — HEMOGLOBIN A1C
Est. average glucose Bld gHb Est-mCnc: 212 mg/dL
Hgb A1c MFr Bld: 9 % — ABNORMAL HIGH (ref 4.8–5.6)

## 2019-10-23 LAB — VITAMIN B12: Vitamin B-12: 354 pg/mL (ref 232–1245)

## 2019-10-28 DIAGNOSIS — H903 Sensorineural hearing loss, bilateral: Secondary | ICD-10-CM | POA: Diagnosis not present

## 2019-10-28 DIAGNOSIS — H9202 Otalgia, left ear: Secondary | ICD-10-CM | POA: Diagnosis not present

## 2019-10-29 ENCOUNTER — Telehealth: Payer: Self-pay

## 2019-10-31 DIAGNOSIS — E118 Type 2 diabetes mellitus with unspecified complications: Secondary | ICD-10-CM | POA: Diagnosis not present

## 2019-10-31 DIAGNOSIS — Z794 Long term (current) use of insulin: Secondary | ICD-10-CM | POA: Diagnosis not present

## 2019-11-03 DIAGNOSIS — K222 Esophageal obstruction: Secondary | ICD-10-CM | POA: Diagnosis not present

## 2019-11-03 DIAGNOSIS — R1310 Dysphagia, unspecified: Secondary | ICD-10-CM | POA: Diagnosis not present

## 2019-11-12 ENCOUNTER — Ambulatory Visit: Payer: Self-pay | Admitting: Pharmacist

## 2019-11-12 DIAGNOSIS — E1122 Type 2 diabetes mellitus with diabetic chronic kidney disease: Secondary | ICD-10-CM

## 2019-11-12 DIAGNOSIS — Z794 Long term (current) use of insulin: Secondary | ICD-10-CM

## 2019-11-12 NOTE — Progress Notes (Signed)
  Chronic Care Management   Outreach Note  11/12/2019 Name: [name] MRN: [id number] DOB: September 19, 1950  Referred by: [name], MD Reason for referral : Chronic Care Management and Diabetes   An unsuccessful telephone outreach was attempted today. The patient was referred to the case management team for assistance with care management and care coordination.  Notified patient of my last day and encourage her to call PCP office or CCM RN for future needs.   SIGNATURE Regina Eck, PharmD, BCPS Clinical Pharmacist, [facility] [place]: [phone]

## 2019-11-24 ENCOUNTER — Other Ambulatory Visit: Payer: Self-pay | Admitting: Nurse Practitioner

## 2019-11-26 ENCOUNTER — Encounter: Payer: Medicare Other | Admitting: Nurse Practitioner

## 2019-11-26 ENCOUNTER — Ambulatory Visit: Payer: Medicare Other

## 2019-11-26 ENCOUNTER — Encounter: Payer: Medicare Other | Admitting: Internal Medicine

## 2019-12-01 DIAGNOSIS — E118 Type 2 diabetes mellitus with unspecified complications: Secondary | ICD-10-CM | POA: Diagnosis not present

## 2019-12-01 DIAGNOSIS — Z794 Long term (current) use of insulin: Secondary | ICD-10-CM | POA: Diagnosis not present

## 2019-12-09 ENCOUNTER — Encounter: Payer: Self-pay | Admitting: Internal Medicine

## 2019-12-16 DIAGNOSIS — R1310 Dysphagia, unspecified: Secondary | ICD-10-CM | POA: Diagnosis not present

## 2019-12-16 DIAGNOSIS — K222 Esophageal obstruction: Secondary | ICD-10-CM | POA: Diagnosis not present

## 2019-12-17 ENCOUNTER — Other Ambulatory Visit: Payer: Self-pay | Admitting: Nurse Practitioner

## 2019-12-17 DIAGNOSIS — I10 Essential (primary) hypertension: Secondary | ICD-10-CM

## 2019-12-17 DIAGNOSIS — E1165 Type 2 diabetes mellitus with hyperglycemia: Secondary | ICD-10-CM

## 2019-12-21 ENCOUNTER — Telehealth: Payer: Self-pay | Admitting: Internal Medicine

## 2019-12-21 NOTE — Chronic Care Management (AMB) (Signed)
  Chronic Care Management   Note  12/21/2019 Name: LENOLA CAPITANO MRN: [id number] DOB: 09/04/1950  JOSHUA AMBRIZ is a 69 y.o. year old female who is a primary care patient of [name], MD and is actively engaged with the care management team. I reached out to [name] by phone today to assist with scheduling an initial visit with the Pharmacist.  Follow up plan: Unsuccessful telephone outreach attempt made. A HIPPA compliant phone message was left for the patient providing contact information and requesting a return call. The care management team will reach out to the patient again over the next 7 days. If patient returns call to provider office, please advise to call [facility] at [phone].  [place], [place] [address] Direct Dial: [phone] [email or web address] [email or web address]: [email or web address]

## 2019-12-22 NOTE — Chronic Care Management (AMB) (Signed)
  Care Management   Note  12/22/2019 Name: [name] MRN: [id number] DOB: 1951/08/05  [name] is a 69 y.o. year old female who is a primary care patient of [name], MD and is actively engaged with the care management team. I reached out to [name] by phone today to assist with scheduling an initial visit with the Pharmacist referred by patients health plan.  Follow up plan: Telephone appointment with care management team member scheduled for: 12/28/2019.  [place], [place] [address] Direct Dial: [phone] [email or web address] [email or web address]: [email or web address]

## 2019-12-27 ENCOUNTER — Other Ambulatory Visit: Payer: Self-pay | Admitting: Nurse Practitioner

## 2019-12-28 ENCOUNTER — Other Ambulatory Visit: Payer: Self-pay

## 2019-12-28 ENCOUNTER — Ambulatory Visit: Payer: Medicare Other

## 2019-12-28 ENCOUNTER — Telehealth: Payer: Self-pay

## 2019-12-28 DIAGNOSIS — Z794 Long term (current) use of insulin: Secondary | ICD-10-CM

## 2019-12-28 DIAGNOSIS — E785 Hyperlipidemia, unspecified: Secondary | ICD-10-CM

## 2019-12-28 DIAGNOSIS — I10 Essential (primary) hypertension: Secondary | ICD-10-CM

## 2019-12-28 NOTE — Telephone Encounter (Signed)
Gabapentin refill

## 2019-12-28 NOTE — Chronic Care Management (AMB) (Signed)
Chronic Care Management Pharmacy  Name: [name]  MRN: [id number] DOB: 1950-09-02  Chief Complaint/ HPI  [name],  69 y.o. , female presents for their Initial CCM visit with the clinical pharmacist via telephone due to COVID-19 Pandemic.  PCP : [name], MD  Their chronic conditions include: Hypertension, Hyperlipidemia, Asthma, GERD, Diabetes with stage III CKD, Osteoarthritis  Office Visits: 10/22/19- OV - Presented for follow up diabetic visit; labs ordered (lipid panel, CMP14+EGFR, HGB A1c, Vit B12, Vit D). Vitamin D and B12 low. Continue Vitamin D supplement and consider B12 injections. Dietary and exercise recommendations provided. Follow up in 3 months.  09/03/19- OV- Presented for follow up diabetic visit; Lunchtime Humalog increased due to elevated BG (30 units before breakfast, 35 units before lunch, 25 units before supper); constipation relieved with Amitiza.   07/22/19- OV- Presented for constipation, bloating and flatulence. Stool softeners and Metamucil have not relieved symptoms. Referred to GI for dysphagia, Labs ordered (lipid panel, CMP14+EGFR, HgbA1c), diabetic foot exam performed, continue checking blood sugar and injecting insulin three times daily w/ ozempic weekly. Start Amitiza for constipation. Follow up in 4-6 weeks. Influenza vaccine administered.   Consult Visit: 10/18/19- ED visit- Presented for sudden onset left ear pain 4 days prior with clear, watery drainage; left eardrum found to be perforated upon exam; recommended naproxen or Tylenol for pain and follow up with ENT.   09/16/19- CCM Encounter w/ Dr. Blanca Friend- Comprehensive medication review, patient education. Pt reported issues receiving FreeStyle Libre.   08/07/19- CCM Encounter w/ Dr. Blanca Friend- Pt recently seen by GI. Failed capsule swallow evaluation. GI to schedule esophageal dilation procedure. Encouraged pt to crush meds as directed due to difficulty swallowing.  07/31/19-   CCM Encounter w/ Dr. Yvonne Kendall to obtain FreeStyle Libre CGM. Pt approved for another 6 moths by [place].   07/29/19-  CCM Encounter w/ Dr. Blanca Friend- Collaboration with [place] for patient's FreeStyle Libre testing supplies  07/27/19-  CCM Encounter w/ Dr. Raeford Razor requesting FreeStyle [place] assistance, denied at pharmacy. [place] contacted.   07/22/19- CCM Encounter with RN- Patient education, medication review, coordination of diabetes testing supplies  07/17/19- CCM Encounter w/ RN- Compliance with GI and Urology treatment plans encourage, patient education  Medications: Outpatient Encounter Medications as of 12/28/2019  Medication Sig  . AMITIZA 8 MCG capsule TAKE 1 CAPSULE(8 MCG) BY MOUTH DAILY WITH BREAKFAST  . amLODipine (NORVASC) 10 MG tablet TAKE 1 TABLET(10 MG) BY MOUTH AT BEDTIME  . Continuous Blood Gluc Receiver (FREESTYLE LIBRE READER) DEVI by Does not apply route. CGM device-Freestyle Libre  . Continuous Blood Gluc Sensor (FREESTYLE LIBRE 14 DAY SENSOR) MISC by Does not apply route. CGM 14-day sensors; send refills to [facility] (in [place])  . dapagliflozin propanediol (FARXIGA) 5 MG TABS tablet Take 5 mg by mouth daily before breakfast.  . docusate sodium (COLACE) 100 MG capsule Take 100 mg by mouth daily.   [name] gabapentin (NEURONTIN) 300 MG capsule TAKE 1 CAPSULE(300 MG) BY MOUTH THREE TIMES DAILY  . insulin lispro (HUMALOG) 100 UNIT/ML injection Inject 0.25-0.35 mLs (25-35 Units total) into the skin See admin instructions. Inject 30 units subcutaneously before breakfast, inject 35 units before lunch and 25 units before supper  . Lancets Misc. MISC 1 each by Does not apply route 4 (four) times daily -  with meals and at bedtime.  . mirabegron ER (MYRBETRIQ) 50 MG TB24 tablet Take 1 tablet (50 mg total) by mouth daily.  . Multiple  Vitamin (MULTIVITAMIN WITH MINERALS) TABS tablet Take 1 tablet by mouth daily.  . naproxen sodium (ALEVE) 220 MG  tablet Take 220 mg by mouth 2 (two) times daily as needed (pain).  . pantoprazole (PROTONIX) 40 MG tablet Take 30- 60 min before your first and last meals of the day (Patient taking differently: Take 40 mg by mouth daily before breakfast. )  . pravastatin (PRAVACHOL) 80 MG tablet Take 0.5 tablets (40 mg total) by mouth daily.  . Semaglutide, 1 MG/DOSE, (OZEMPIC, 1 MG/DOSE,) 2 MG/1.5ML SOPN Inject 1 mg into the skin every Thursday.  [name] spironolactone (ALDACTONE) 50 MG tablet TAKE 1 TABLET(50 MG) BY MOUTH DAILY  . Vitamin D, Ergocalciferol, (DRISDOL) 1.25 MG (50000 UNIT) CAPS capsule TAKE 1 CAPSULE BY MOUTH EVERY 7 DAYS ON WEDNESDAYS  . [DISCONTINUED] Vitamin D, Ergocalciferol, (DRISDOL) 1.25 MG (50000 UNIT) CAPS capsule TAKE 1 CAPSULE BY MOUTH EVERY 7 DAYS ON WEDNESDAYS  . albuterol (PROAIR HFA) 108 (90 Base) MCG/ACT inhaler 2 puffs every 4 hours as needed only  if your can't catch your breath (Patient not taking: Reported on 12/28/2019)  . losartan (COZAAR) 50 MG tablet Take 1 tablet (50 mg total) by mouth daily. (Patient not taking: Reported on 01/15/2020)  . Nebivolol HCl (BYSTOLIC) 20 MG TABS Take 1 tablet (20 mg total) by mouth daily. (Patient not taking: Reported on 01/15/2020)  . [DISCONTINUED] gabapentin (NEURONTIN) 300 MG capsule TAKE 1 CAPSULE(300 MG) BY MOUTH THREE TIMES DAILY   No facility-administered encounter medications on file as of 12/28/2019.   Current Diagnosis/Assessment:  Goals Addressed            This Visit's Progress   . Pharmacy Care Plan       CARE PLAN ENTRY  Current Barriers:  . Chronic Disease Management support, education, and care coordination needs related to Hypertension, Hyperlipidemia, Diabetes, Gastroesophageal Reflux Disease, Asthma, and Chronic Kidney Disease   Hypertension . Pharmacist Clinical Goal(s): o Over the next 45 days, patient will work with PharmD and providers to achieve BP goal <130/80 . Current regimen:  o Amlodipine 79m  daily o Spironolactone 553mdaily . Interventions: o Collaborate with PCP regarding losartan prescription on profile, but patient states has not been filled since 2017. No fill history available for losartan from pharmacy.  . Patient self care activities - Over the next 45 days, patient will: o Check BP once weekly and if symptomatic, document, and provide at future appointments o Ensure daily salt intake < 2300 mg/day  Hyperlipidemia . Pharmacist Clinical Goal(s): o Over the next 45 days, patient will work with PharmD and providers to achieve LDL goal < 70 . Current regimen:  o Pravastatin 8058m/2 tablet daily . Interventions: o Provided extensive dietary and exercise recommendations . Patient self care activities - Over the next 45 days, patient will: o Focus on a heart-healthy, balanced diet as discussed  Diabetes . Pharmacist Clinical Goal(s): o Over the next 45 days, patient will work with PharmD and providers to achieve A1c goal <7% . Current regimen:  o Ozempic 1mg16mekly o Humalog 25 units before breakfast, 35 units before lunch, and 25 units before dinner . Interventions: o Dietary and exercise recommendations provided . Patient self care activities - Over the next 45 days, patient will: o Check blood sugar continuously, but at least 3-4 times daily, document, and provide at future appointments o Contact provider with any episodes of hypoglycemia o Reduce the carbohydrates she eats on a daily basis o Increase water  intake by 1 glass a day o Eat more lean protein (chicken,fish)  Medication management . Pharmacist Clinical Goal(s): o Over the next 45 days, patient will work with PharmD and providers to achieve optimal medication adherence . Current pharmacy: [facility] . Interventions o Comprehensive medication review performed. o Utilize [facility] for medication synchronization, packaging and delivery . Patient self care activities - Over the next 45 days,  patient will: o Focus on medication adherence by using adherence packaging o Take medications as prescribed o Report any questions or concerns to PharmD and/or provider(s)  Initial goal documentation                          Diabetes   Recent Relevant Labs: Lab Results  Component Value Date/Time   HGBA1C 5.9 (H) 12/31/2019 04:11 PM   HGBA1C 9.0 (H) 10/22/2019 04:03 PM   MICROALBUR 80 12/31/2019 03:19 PM   MICROALBUR 6.9 (H) 09/07/2016 01:41 PM   MICROALBUR 7.2 (H) 08/30/2015 01:59 PM   Kidney Function Lab Results  Component Value Date/Time   CREATININE 1.34 (H) 12/31/2019 04:11 PM   CREATININE 1.28 (H) 10/22/2019 04:03 PM   GFR 51.07 (L) 05/17/2017 08:54 AM   GFRNONAA 41 (L) 12/31/2019 04:11 PM   GFRAA 47 (L) 12/31/2019 04:11 PM  Stage 3a CKD   Checking BG: Multiple times daily with continuous glucose monitor (FreeStyle Libre)  Recent FBG Readings: 201, 119 Recent pre-meal BG readings:  Recent 2hr PP BG readings:  250, 209, 236, 331, 310, 279, 222, 324, 329 Recent HS BG readings: 294, 316, 320, 61, 87 Patient has failed these meds in past: Invokana, Levemir, Lantus, Victoza, metformin, Actos, Janumet Patient is currently uncontrolled on the following medications:  -Ozempic 78m weekly on Thursdays  -Humalog 25-35 units before meals (25U am, 35U lunch, 25U dinner)          Also doing additional sliding scale every other day (5-10 units)  -Gabapentin 3046mthree times daily  Last diabetic Foot exam: 07/22/19 Last diabetic Eye exam: 08/10/19   We discussed:  -Diet extensively  Pt reports drinking about 32 oz of water per day. When we  discussed goal of 64oz daily pt did not feel that was possible.  Encourage increase water intake by 1 glass daily (4-6oz).   Pt reports that appetite is not great, but she knows she has to  eat while on insulin  Breakfast: Oatmeal w/ raisins, whole wheat toast, orange juice  Lunch/Dinner: Tuna sandwich and other sandwiches, vegetable   plate (cabbage, greens, etc), not much protein (some fish,  beans), Glucerna shake  Recommended increased intake of lean protein at meals  Provided education regarding carbohydrates and elevated BG -Exercise extensively  Pt goes to the [facility] [employer] to walk twice weekly. In general, pt  walks twice daily for a total of 35-40 minutes daily -Pt reports that gabapentin helps with pain sometimes  Plan -Continue current medications and follow up with HgbA1c  Hypertension   Office blood pressures are  BP Readings from Last 3 Encounters:  12/31/19 (!) 142/70  12/31/19 (!) 142/70  10/22/19 124/82    Patient has failed these meds in the past: Carvedilol, clonidine, metoprolol, valsartan/HCTZ, losartan/HCTZ Patient is currently uncontrolled on the following medications:  -Losartan 5077maily -Amlodipine 6m4mily -Spironolactone 50mg21mly  Patient checks BP at home once a week and when feeling symptomatic  Patient home BP readings are ranging: 158/90  We discussed: -Diet and exercise extensively  Pt denied adding any additional salt to her foods -Pt stated Losartan has not been filled since 2017 (will discuss with PCP) -Pt does not have and is not taking Bystolic (was on medication list)  Plan -Continue current medications  -Discuss Losartan Rx with PCP, it appears pt has not been getting this filled   Hyperlipidemia   Lipid Panel     Component Value Date/Time   CHOL 168 12/31/2019 1611   TRIG 88 12/31/2019 1611   HDL 54 12/31/2019 1611   CHOLHDL 3.1 12/31/2019 1611   CHOLHDL 4 09/07/2016 1341   VLDL 15.6 09/07/2016 1341   LDLCALC 98 12/31/2019 1611   LDLDIRECT 152.8 10/09/2013 0938   LABVLDL 16 12/31/2019 1611     The 10-year ASCVD risk score Mikey Bussing DC Jr., et al., 2013) is: 24.2%   Values used to calculate the score:     Age: 89 years     Sex: Female     Is Non-Hispanic African American: Yes     Diabetic: Yes     Tobacco smoker: No     Systolic Blood Pressure: 324  mmHg     Is BP treated: Yes     HDL Cholesterol: 54 mg/dL     Total Cholesterol: 168 mg/dL   Patient has failed these meds in past: N/A Patient is currently uncontrolled on the following medications:  -Pravastatin 80 mg daily, 1/2 tablet daily -Aspirin 55m daily  We discussed:   -Goal LDL < 70 -Diet and exercise extensively  Plan -Continue current medications   GERD   Patient has failed these meds in past: Dexilant, sucralfate Patient is currently controlled on the following medications:  -Pantoprazole 444mdaily  We discussed:   -Pt had to have esophagus stretched 4/5 times in March and has had this done in the past -Pantoprazole helps with her acid reflux  Plan -Continue current medications  Osteoarthritis   Patient has failed these meds in past: Meloxicam, diclofenac, tramadol, Norco, Percocet, Tylenol, Tylenol #3, ibuprofen,  Patient is currently controlled on the following medications:  -Naproxen 22052mwice daily as needed  We discussed:  -Pt reports taking Aleve as needed for arthritis pain -Concerns regarding Aleve use due to potential for kidney damage, fluid retention, increased BP -Tylenol is a safer alternative for this patient due to CKD and HTN -Pt states that pain is tolerable on Tylenol  Plan -Continue current medications  -Utilizing Tylenol more frequently than Aleve   History of Asthma    Tobacco Status:  Social History   Tobacco Use  Smoking Status Never Smoker  Smokeless Tobacco Never Used   Patient has failed these meds in past: Advair Patient is currently controlled on the following medications:  -Proair 2 puffs every 4 hours as needed -Dulera 2 puffs every 12 hours  Using maintenance inhaler regularly? No Frequency of rescue inhaler use:  infrequently as needed  We discussed:   -Pt has not had to use Dulera in 3 weeks and has not used Proair in over a year -[facility]   Plan -Continue current  medications  Constipation   Patient has failed these meds in past: Stool softeners, metamucil Patient is currently controlled on the following medications:  -Amitiza 8mc28maily -Docusate 100mg67mly  We discussed: -Goal for water intake is 64oz daily -Pt reports Amitiza helps with constipation and she experiences less stomach pain  Plan -Continue current medications  Overactive bladder    Patient has failed these  meds in past: Vesicare, oxybutynin Patient is currently controlled on the following medications:  -Myrbetriq 27m daily  -Nitrofurantion 1038mdaily for UTI prevention  We discussed: -Pt reports that her symptoms are better on Myrbetriq  Plan -Continue current medications  Vitamin D Deficiency  Vitamin D: 29.8 on 10/22/19  Patient has failed these meds in past: N/A Patient is currently uncontrolled on the following medications:  -Ergocalciferol 50,000 units daily on Thursdays  Plan -Continue current medications  Vaccines   Reviewed and discussed patient's vaccination history.    Immunization History  Administered Date(s) Administered  . Influenza Whole 06/27/2009, 06/13/2010  . Influenza, High Dose Seasonal PF 07/22/2019  . Influenza,inj,Quad PF,6+ Mos 06/03/2017  . Influenza-Unspecified 03/27/2013, 03/27/2014, 05/18/2015  . Pneumococcal Conjugate-13 07/24/2017  . Pneumococcal Polysaccharide-23 08/27/2006, 11/29/2009, 03/24/2019  . Td 10/18/2009  . Tdap 12/27/2017   -Pt states she received [facility]rix at [facility] Plan -No recommendations at this time  Medication Management   Pt uses [facility] all medications  Pt endorses 70% compliance  We discussed:  -Importance of medication adherence  Plan Utilize [facility] for medication synchronization, packaging and delivery  Verbal consent obtained for [facility] enhanced pharmacy services (medication synchronization, adherence packaging, delivery coordination). A  medication sync plan was created to allow patient to get all medications delivered once every 30 to 90 days per patient preference. Patient understands they have freedom to choose pharmacy and clinical pharmacist will coordinate care between all prescribers and [facility].   Follow up: 7 week phone visit  CoJannette FogoPharmD Clinical Pharmacist [facility] [phone]

## 2019-12-31 ENCOUNTER — Ambulatory Visit (INDEPENDENT_AMBULATORY_CARE_PROVIDER_SITE_OTHER): Payer: Medicare Other | Admitting: Nurse Practitioner

## 2019-12-31 ENCOUNTER — Other Ambulatory Visit: Payer: Self-pay

## 2019-12-31 ENCOUNTER — Ambulatory Visit (INDEPENDENT_AMBULATORY_CARE_PROVIDER_SITE_OTHER): Payer: Medicare Other

## 2019-12-31 ENCOUNTER — Encounter: Payer: Self-pay | Admitting: Nurse Practitioner

## 2019-12-31 VITALS — BP 142/70 | HR 87 | Temp 98.3°F | Ht 66.0 in | Wt 166.9 lb

## 2019-12-31 VITALS — BP 142/70 | HR 87 | Temp 98.3°F | Ht 66.0 in | Wt 166.8 lb

## 2019-12-31 DIAGNOSIS — E1165 Type 2 diabetes mellitus with hyperglycemia: Secondary | ICD-10-CM | POA: Diagnosis not present

## 2019-12-31 DIAGNOSIS — Z0000 Encounter for general adult medical examination without abnormal findings: Secondary | ICD-10-CM

## 2019-12-31 DIAGNOSIS — M25562 Pain in left knee: Secondary | ICD-10-CM

## 2019-12-31 DIAGNOSIS — Z794 Long term (current) use of insulin: Secondary | ICD-10-CM | POA: Diagnosis not present

## 2019-12-31 DIAGNOSIS — I10 Essential (primary) hypertension: Secondary | ICD-10-CM | POA: Diagnosis not present

## 2019-12-31 DIAGNOSIS — E559 Vitamin D deficiency, unspecified: Secondary | ICD-10-CM | POA: Diagnosis not present

## 2019-12-31 DIAGNOSIS — G8929 Other chronic pain: Secondary | ICD-10-CM

## 2019-12-31 DIAGNOSIS — E118 Type 2 diabetes mellitus with unspecified complications: Secondary | ICD-10-CM | POA: Diagnosis not present

## 2019-12-31 LAB — POCT UA - MICROALBUMIN
Creatinine, POC: 200 mg/dL
Microalbumin Ur, POC: 80 mg/L

## 2019-12-31 LAB — POCT URINALYSIS DIPSTICK
Bilirubin, UA: NEGATIVE
Blood, UA: NEGATIVE
Glucose, UA: POSITIVE — AB
Ketones, UA: NEGATIVE
Leukocytes, UA: NEGATIVE
Nitrite, UA: NEGATIVE
Protein, UA: NEGATIVE
Spec Grav, UA: 1.015 (ref 1.010–1.025)
Urobilinogen, UA: 0.2 E.U./dL
pH, UA: 6 (ref 5.0–8.0)

## 2019-12-31 MED ORDER — FARXIGA 5 MG PO TABS: 5.0000 mg | ORAL_TABLET | Freq: Every day | ORAL | 1 refills | Status: DC

## 2019-12-31 NOTE — Progress Notes (Signed)
This visit occurred during the SARS-CoV-2 public health emergency.  Safety protocols were in place, including screening questions prior to the visit, additional usage of staff PPE, and extensive cleaning of exam room while observing appropriate contact time as indicated for disinfecting solutions.  Subjective:   [name] is a 69 y.o. female who presents for Medicare Annual (Subsequent) preventive examination.  Review of Systems:  n/a Cardiac Risk Factors include: advanced age (>77mn, >>72women);diabetes mellitus;hypertension     Objective:     Vitals: BP (!) 142/70 (BP Location: Left Arm, Patient Position: Sitting, Cuff Size: Normal)   Pulse 87   Temp 98.3 F (36.8 C) (Oral)   Ht 5' 6"  (1.676 m)   Wt 166 lb 12.8 oz (75.7 kg)   LMP  (LMP Unknown)   SpO2 97%   BMI 26.92 kg/m   Body mass index is 26.92 kg/m.  Advanced Directives 12/31/2019 04/07/2019 04/06/2019 03/06/2019 03/06/2019 11/24/2018 11/20/2018  Does Patient Have a Medical Advance Directive? No No No No No No No  Would patient like information on creating a medical advance directive? No - Patient declined No - Patient declined No - Patient declined No - Patient declined No - Patient declined No - Patient declined No - Patient declined  Pre-existing out of facility DNR order (yellow form or pink MOST form) - - - - - - -    Tobacco Social History   Tobacco Use  Smoking Status Never Smoker  Smokeless Tobacco Never Used     Counseling given: Not Answered   Clinical Intake:  Pre-visit preparation completed: Yes  Pain : No/denies pain     Nutritional Status: BMI 25 -29 Overweight Nutritional Risks: None Diabetes: Yes  How often do you need to have someone help you when you read instructions, pamphlets, or other written materials from your doctor or pharmacy?: 1 - Never What is the last grade level you completed in school?: 12th grade  Interpreter Needed?: No  Information entered by :: NAllen  LPN  Past Medical History:  Diagnosis Date  . Arthritis    L knee, hands, back   . Asthma   . Diabetes mellitus type 2, insulin dependent ([place]   . Fibromyalgia   . Full dentures   . GERD (gastroesophageal reflux disease)   . H/O hiatal hernia   . Headache    h/o migraines - was followed for a time with a wellness doctor  . History of esophageal dilatation   . History of rhabdomyolysis    03/ 2015  . Hyperlipidemia   . Hypertension   . Insulin pump in place    since 12/ 2014--  [facility]  . Osteoarthritis of left knee, primary localized 08/17/2014  . Renal insufficiency   . SUI (stress urinary incontinence, female)   . Wears glasses    Past Surgical History:  Procedure Laterality Date  . BLADDER SUSPENSION N/A 03/09/2014   Procedure: CYSTOSCOPY/SLING;  Surgeon: SReece Packer MD;  [facility]  Service: Urology;  Laterality: N/A;  . CARDIAC CATHETERIZATION  07-20-2009   DR TShelva Majestic  MODERATE LVH/  NORMAL  LVEF/  NORMAL CORONARY AND RENAL ARTERIES  . CARPAL TUNNEL RELEASE Right 2000  . CHOLECYSTECTOMY  1990  . COLONOSCOPY WITH ESOPHAGOGASTRODUODENOSCOPY (EGD)  06-18-2002  . ESOPHAGOGASTRODUODENOSCOPY (EGD) WITH ESOPHAGEAL DILATION  06-12-2010  . EXCISION LEFT UPPER ARM MASS  01-29-2014  . EYE SURGERY  2010   laser on R eye  . INTERSTIM IMPLANT  REMOVAL N/A 03/06/2019   Procedure: REMOVAL OF INTERSTIM IMPLANT;  Surgeon: Bjorn Loser, MD;  Location: [facility];  Service: Urology;  Laterality: N/A;  . KNEE ARTHROSCOPY Right 1989  &  2002  . NEGATIVE SLEEP STUDY  2012   PER PT  . PARTIAL KNEE ARTHROPLASTY Left 08/17/2014   Procedure: LEFT UNICOMPARTMENTAL KNEE;  Surgeon: Johnny Bridge, MD;  Location: [place];  Service: Orthopedics;  Laterality: Left;   Family History  Problem Relation Age of Onset  . Diabetes Mother   . Hypertension Mother   . Lung cancer Mother        smoked  . Diabetes Father   . Hypertension Father   . Lung cancer  Father        smoked  . Diabetes Brother   . Heart failure Sister   . Diabetes Maternal Grandmother   . Cancer Maternal Grandmother    Social History   Socioeconomic History  . Marital status: Divorced    Spouse name: Not on file  . Number of children: Not on file  . Years of education: Not on file  . Highest education level: Not on file  Occupational History  . Occupation: disability  Tobacco Use  . Smoking status: Never Smoker  . Smokeless tobacco: Never Used  Substance and Sexual Activity  . Alcohol use: No  . Drug use: No  . Sexual activity: Not Currently  Other Topics Concern  . Not on file  Social History Narrative  . Not on file   Social Determinants of Health   Financial Resource Strain: Low Risk   . Difficulty of Paying Living Expenses: Not hard at all  Food Insecurity: No Food Insecurity  . Worried About Charity fundraiser in the Last Year: Never true  . Ran Out of Food in the Last Year: Never true  Transportation Needs: No Transportation Needs  . Lack of Transportation (Medical): No  . Lack of Transportation (Non-Medical): No  Physical Activity: Sufficiently Active  . Days of Exercise per Week: 7 days  . Minutes of Exercise per Session: 60 min  Stress: No Stress Concern Present  . Feeling of Stress : Not at all  Social Connections:   . Frequency of Communication with Friends and Family:   . Frequency of Social Gatherings with Friends and Family:   . Attends Religious Services:   . Active Member of Clubs or Organizations:   . Attends Archivist Meetings:   [name] Marital Status:     Outpatient Encounter Medications as of 12/31/2019  Medication Sig  . AMITIZA 8 MCG capsule TAKE 1 CAPSULE(8 MCG) BY MOUTH DAILY WITH BREAKFAST  . amLODipine (NORVASC) 10 MG tablet TAKE 1 TABLET(10 MG) BY MOUTH AT BEDTIME  . Continuous Blood Gluc Receiver (FREESTYLE LIBRE READER) DEVI by Does not apply route. CGM device-Freestyle Libre  . Continuous Blood Gluc Sensor  (FREESTYLE LIBRE 14 DAY SENSOR) MISC by Does not apply route. CGM 14-day sensors; send refills to [facility] (in [place])  . docusate sodium (COLACE) 100 MG capsule Take 100 mg by mouth daily.   [name] gabapentin (NEURONTIN) 300 MG capsule TAKE 1 CAPSULE(300 MG) BY MOUTH THREE TIMES DAILY  . insulin lispro (HUMALOG) 100 UNIT/ML injection Inject 0.25-0.35 mLs (25-35 Units total) into the skin See admin instructions. Inject 30 units subcutaneously before breakfast, inject 35 units before lunch and 25 units before supper  . Lancets Misc. MISC 1 each by Does not apply route 4 (four) times daily -  with meals and at bedtime.  [name] losartan (COZAAR) 50 MG tablet Take 1 tablet (50 mg total) by mouth daily.  . mirabegron ER (MYRBETRIQ) 50 MG TB24 tablet Take 1 tablet (50 mg total) by mouth daily.  . Multiple Vitamin (MULTIVITAMIN WITH MINERALS) TABS tablet Take 1 tablet by mouth daily.  . naproxen sodium (ALEVE) 220 MG tablet Take 220 mg by mouth 2 (two) times daily as needed (pain).  . Nebivolol HCl (BYSTOLIC) 20 MG TABS Take 1 tablet (20 mg total) by mouth daily.  . pantoprazole (PROTONIX) 40 MG tablet Take 30- 60 min before your first and last meals of the day (Patient taking differently: Take 40 mg by mouth daily before breakfast. )  . pravastatin (PRAVACHOL) 80 MG tablet Take 0.5 tablets (40 mg total) by mouth daily.  . Semaglutide, 1 MG/DOSE, (OZEMPIC, 1 MG/DOSE,) 2 MG/1.5ML SOPN Inject 1 mg into the skin every Thursday.  [name] spironolactone (ALDACTONE) 50 MG tablet TAKE 1 TABLET(50 MG) BY MOUTH DAILY  . Vitamin D, Ergocalciferol, (DRISDOL) 1.25 MG (50000 UNIT) CAPS capsule TAKE 1 CAPSULE BY MOUTH EVERY 7 DAYS ON WEDNESDAYS  . albuterol (PROAIR HFA) 108 (90 Base) MCG/ACT inhaler 2 puffs every 4 hours as needed only  if your can't catch your breath (Patient not taking: Reported on 12/28/2019)   No facility-administered encounter medications on file as of 12/31/2019.    Activities of Daily Living In your  present state of health, do you have any difficulty performing the following activities: 12/31/2019 04/07/2019  Hearing? N N  Vision? N N  Difficulty concentrating or making decisions? N N  Walking or climbing stairs? N Y  Comment joint pains -  Dressing or bathing? N N  Doing errands, shopping? N N  Preparing Food and eating ? N -  Using the Toilet? N -  In the past six months, have you accidently leaked urine? Y -  Do you have problems with loss of bowel control? N -  Managing your Medications? N -  Managing your Finances? N -  Housekeeping or managing your Housekeeping? N -  Some recent data might be hidden    Patient Care Team: [name], MD as PCP - General (Internal Medicine) Elayne Snare, MD as Consulting Physician (Endocrinology) Bjorn Loser, MD as Consulting Physician (Urology) Juanita Craver, MD as Consulting Physician (Gastroenterology) Rex Kras, Claudette Stapler, RN as Case Manager [place], Kennieth Francois, [facility] (Pharmacist)    Assessment:   This is a routine wellness examination for Bhumi.  Exercise Activities and Dietary recommendations Current Exercise Habits: Home exercise routine, Type of exercise: walking, Time (Minutes): 60, Frequency (Times/Week): 7, Weekly Exercise (Minutes/Week): 420  Goals    . "I am having problems swallowing" (pt-stated)     Current Barriers:  [name] Knowledge Deficits related to diagnosis and treatment management of Impaired swallowing   Nurse Case Manager Clinical Goal(s):  [name] Over the next 30 days, patient will verbalize understanding of plan for evaluation and treatment of Impaired swallowing . Over the next 60 days, patient will not experience any complications secondary to having Impaired swallowing such as aspiration and or weight loss/dehydration   CCM RN CM Interventions:  07/17/19 call completed with patient   . Evaluation of current treatment plan related to Impaired Swallowing and patient's adherence to plan as established by  provider. . Advised patient to report her symptoms of impaired swallowing to the PCP for further evaluation and treatment; discussed for past diagnosis and treatment for this condition; patient reports  this has been a chronic problem but has been stable over the past couple of years; discussed this condition has required esophageal dilation in the past . Provided education to patient re: risk for aspiration, weight loss, malnutrition and or dehydration due to impaired swallowing  . Reviewed medications with patient and discussed patient is adhering to taking Pantoprazole 40 mg bid for GERD  . Discussed plans with patient for ongoing care management follow up and provided patient with direct contact information for care management team . Reviewed scheduled/upcoming provider appointments including: PCP provider [name], FNP for evaluation of impaired swallowing scheduled for 07/22/19 @1 :50 PM   CCM PharmD Interventions:  08/07/19 call completed with patient . Patient recently seen by GI in the past week.  She failed a capsule swallow evaluation.  She states she was unable to swallow capsule.  She has also reported difficulty swallowing liquids. . GI to schedule esophageal dilation procedure  . Encouraged patient to continue crushing meds as directed  Patient Self Care Activities:  . Self administers medications as prescribed . Attends all scheduled provider appointments . Calls pharmacy for medication refills . Performs ADL's independently . Performs IADL's independently . Calls provider office for new concerns or questions  Please see past updates related to this goal by clicking on the "Past Updates" button in the selected goal      . I want to control my diabetes (pt-stated)     Current Barriers:  [name] Knowledge Deficits related to diabetes diet & lifestyle modifications . Non Adherence to prescribed medication regimen  Pharmacist Clinical Goal(s):  [name] Over the next 90 days, patient  will work with PharmD and PCP to address needs related to optimized medication management of chronic conditions  Goal Met . New - 07/17/19 - Over the next 60 days, patient will experience daily glycemic control to be within target range of 80-130  . New - 07/17/19 - Over the next 90 days, patient will lower her A1C <8.7  Interventions: . Comprehensive medication review performed.  Reviewed medication fill history via insurance claims data. . Reviewed & discussed the following diabetes-related information with patient: o Encouraged patient to continue checking blood sugars as directed-->patient now has scanner glucometer (FreeStyle [place]), will be able to download data at PCP office during visits.  - She has been without libre supplies x1 week.  She has received refill and reports BGs are improving. o Confirmed current DM regimen: Humalog 30u breakfast, 35u lunch, 25u dinner; Ozempic 68m weekly.  Patient reports she is content with this regimen and is tolerating it well. (increased meal time due to higher post prandials) o Reviewed medication purpose/side effects-->patient denies adverse events, denies hypoglycemia, reports FBG<150.  Denies BG>200.  She has enjoyed scanning her arm for BG vs. Lancet method.   o Continue to follow ADA recommended "diabetes-friendly" diet  (reviewed healthy snack/food options).  Counseled patient to avoid sugary drinks and foods.  She is working to decrease potato & carbohydrates.  She is also able to walk more because she has had her bladder procedure.  Her bladder condition is improving. o Most recent A1c is 9.3% on 07/22/19 (was 8.7% on 03/19/19, still decreased from 12.0 in March 2020) o Anti-hypertensive regimen: amlodipine, losartan (no fill data) o Anti-hyperlipidemic regimen: pravastatin - Discussed the benefits of taking statin in diabetes.  Patient states she will try pravastatin daily as she has not had statin intolerance in the past.  Refill called into  [facility] on 06/15/2019. Continue taking  all medications as prescribed by provider.   CCM RN CM Interventions: 07/22/19 call completed with patient   . Received inbound call from patient stating she has not received her Elenor Legato supply shipment containing her supply's and replacement arm patch . Discussed the manufacturer is needing clinical documentation from the PCP provider but the patient is not sure what is needed at this time . Discussed Ms. Standard will use her manual glucometer until her replacement is received . Sent in basket message to embedded Pharm D Lottie Dawson requesting she follow up with patient next week to offer assistance . Evaluation of current treatment plan related to Diabetes and patient's adherence to plan as established by provider. . Provided education to patient re: current A1C 8.7 obtained on 03/19/19; discussed target A1C <7.0; Reviewed Self health measures taken by patient including following an ADA diet, and taking her medications as prescribed; patient states she is trying to stay active but does not have a daily structured exercise routine at this time due to experiencing several ongoing health related issues; discussed these conditions are being monitored by MD accordingly . Reviewed medications with patient and discussed patient is tolerating Humalog 25u breakfast, 35u lunch, 25u dinner, Ozempic 1 mg weekly. Reviewed adherence to taking Statin for hyperlipidemic control and antihypertensive control with Amlodipine and Losartan.  . Discussed plans with patient for ongoing care management follow up and provided patient with direct contact information for care management team . Provided patient with printed educational materials related to Diabetes Management Zone Safety Tool . Advised patient, providing education and rationale, to check cbg before meals daily and record, calling the CCM team and or PCP for findings outside established parameters.  Discussed patient  continues to use her Libre freestyle scanner system and feels she is better able to manage her DM with this meter in place   Patient Self Care Activities:  . Self administers medications as prescribed . Attends all scheduled provider appointments . Calls pharmacy for medication refills . Performs ADL's independently . Calls provider office for new concerns or questions  Please see past updates related to this goal by clicking on the "Past Updates" button in the selected goal      . Patient Stated (pt-stated)     Wants to increase activity. Does not want to be tired all the time    . Weight (lb) < 200 lb (90.7 kg)     12/31/2019, wants to get down to 140-145 pounds       Fall Risk Fall Risk  12/31/2019 10/22/2019 09/03/2019 07/22/2019 04/14/2019  Falls in the past year? 0 0 0 0 1  Number falls in past yr: - - - - 0  Injury with Fall? - - - - 0  Risk for fall due to : Medication side effect - - - -  Follow up Falls evaluation completed;Education provided;Falls prevention discussed - - - -   Is the patient's home free of loose throw rugs in walkways, pet beds, electrical cords, etc?   yes      Grab bars in the bathroom? yes      Handrails on the stairs?   yes      Adequate lighting?   yes  Timed Get Up and Go performed: n/a  Depression Screen PHQ 2/9 Scores 12/31/2019 10/22/2019 09/03/2019 04/14/2019  PHQ - 2 Score 0 0 0 0  PHQ- 9 Score 0 - - -     Cognitive Function     6CIT Screen 12/31/2019  11/20/2018  What Year? 0 points 0 points  What month? 0 points 0 points  What time? 0 points 0 points  Count back from 20 0 points 0 points  Months in reverse 0 points 0 points  Repeat phrase 2 points 0 points  Total Score 2 0    Immunization History  Administered Date(s) Administered  . Influenza Whole 06/27/2009, 06/13/2010  . Influenza, High Dose Seasonal PF 07/22/2019  . Influenza,inj,Quad PF,6+ Mos 06/03/2017  . Influenza-Unspecified 03/27/2013, 03/27/2014, 05/18/2015  .  Pneumococcal Conjugate-13 07/24/2017  . Pneumococcal Polysaccharide-23 08/27/2006, 11/29/2009, 03/24/2019  . Td 10/18/2009  . Tdap 12/27/2017    Qualifies for Shingles Vaccine? yes  Screening Tests Health Maintenance  Topic Date Due  . OPHTHALMOLOGY EXAM  Never done  . COVID-19 Vaccine (1) Never done  . INFLUENZA VACCINE  03/27/2020  . HEMOGLOBIN A1C  04/20/2020  . FOOT EXAM  07/21/2020  . MAMMOGRAM  07/09/2021  . COLONOSCOPY  12/13/2026  . TETANUS/TDAP  12/28/2027  . DEXA SCAN  Completed  . Hepatitis C Screening  Completed  . PNA vac Low Risk Adult  Completed    Cancer Screenings: Lung: Low Dose CT Chest recommended if Age 34-80 years, 30 pack-year currently smoking OR have quit w/in 15years. Patient does not qualify. Breast:  Up to date on Mammogram? Yes   Up to date of Bone Density/Dexa? Yes Colorectal: up to date  Additional Screenings: : Hepatitis C Screening: 09/23/2015     Plan:    Patient wants to get down to 140-145 pounds   I have personally reviewed and noted the following in the patient's chart:   . Medical and social history . Use of alcohol, tobacco or illicit drugs  . Current medications and supplements . Functional ability and status . Nutritional status . Physical activity . Advanced directives . List of other physicians . Hospitalizations, surgeries, and ER visits in previous 12 months . Vitals . Screenings to include cognitive, depression, and falls . Referrals and appointments  In addition, I have reviewed and discussed with patient certain preventive protocols, quality metrics, and best practice recommendations. A written personalized care plan for preventive services as well as general preventive health recommendations were provided to patient.     [name], LPN  0/02/1218

## 2019-12-31 NOTE — Progress Notes (Signed)
This visit occurred during the SARS-CoV-2 public health emergency.  Safety protocols were in place, including screening questions prior to the visit, additional usage of staff PPE, and extensive cleaning of exam room while observing appropriate contact time as indicated for disinfecting solutions.  Subjective:     Patient ID: [name] , female    DOB: October 23, 1950 , 69 y.o.   MRN: [id number]   Chief Complaint  Patient presents with  . Annual Exam    HPI  Here for HM. Denies eating fast food.    Diabetes She presents for her follow-up diabetic visit. She has type 2 diabetes mellitus. There are no hypoglycemic associated symptoms. Pertinent negatives for hypoglycemia include no dizziness or headaches. Pertinent negatives for diabetes include no blurred vision, no chest pain, no polydipsia, no polyphagia and no polyuria. There are no hypoglycemic complications. There are no diabetic complications. Risk factors for coronary artery disease include diabetes mellitus and obesity. Current diabetic treatment includes oral agent (dual therapy). She is compliant with treatment all of the time. She is following a generally unhealthy diet. When asked about meal planning, she reported none. She has not had a previous visit with a dietitian. She rarely participates in exercise. (Blood sugar last night was 43. ) She does not see a podiatrist.Eye exam is current.  Hypertension This is a chronic problem. The current episode started more than 1 year ago. The problem is unchanged. The problem is controlled. Pertinent negatives include no anxiety, blurred vision, chest pain, headaches or palpitations. There are no associated agents to hypertension. Risk factors for coronary artery disease include diabetes mellitus and sedentary lifestyle. There are no compliance problems.      The patient states she uses post menopausal status Mammogram last done 07/13/2019.  Negative for: breast discharge, breast  lump(s), breast pain and breast self exam.  Pertinent negatives include abnormal bleeding (hematology), anxiety, decreased libido, depression, difficulty falling sleep, dyspareunia, history of infertility, nocturia, sexual dysfunction, sleep disturbances, urinary incontinence, urinary urgency, vaginal discharge and vaginal itching. Diet regular; vegetables, fish and protein. She has talked to [place] The patient states her exercise level is daily with walking.   The patient's tobacco use is:  Social History   Tobacco Use  Smoking Status Never Smoker  Smokeless Tobacco Never Used   She has been exposed to passive smoke. The patient's alcohol use is:  Social History   Substance and Sexual Activity  Alcohol Use No   Past Medical History:  Diagnosis Date  . Arthritis    L knee, hands, back   . Asthma   . Diabetes mellitus type 2, insulin dependent ([place])   . Fibromyalgia   . Full dentures   . GERD (gastroesophageal reflux disease)   . H/O hiatal hernia   . Headache    h/o migraines - was followed for a time with a wellness doctor  . History of esophageal dilatation   . History of rhabdomyolysis    03/ 2015  . Hyperlipidemia   . Hypertension   . Insulin pump in place    since 12/ 2014--  [facility]  . Osteoarthritis of left knee, primary localized 08/17/2014  . Renal insufficiency   . SUI (stress urinary incontinence, female)   . Wears glasses      Family History  Problem Relation Age of Onset  . Diabetes Mother   . Hypertension Mother   . Lung cancer Mother        smoked  . Diabetes Father   .  Hypertension Father   . Lung cancer Father        smoked  . Diabetes Brother   . Heart failure Sister   . Diabetes Maternal Grandmother   . Cancer Maternal Grandmother      Current Outpatient Medications:  .  albuterol (PROAIR HFA) 108 (90 Base) MCG/ACT inhaler, 2 puffs every 4 hours as needed only  if your can't catch your breath (Patient not taking: Reported on 12/28/2019),  Disp: 1 Inhaler, Rfl: 11 .  AMITIZA 8 MCG capsule, TAKE 1 CAPSULE(8 MCG) BY MOUTH DAILY WITH BREAKFAST, Disp: 90 capsule, Rfl: 1 .  amLODipine (NORVASC) 10 MG tablet, TAKE 1 TABLET(10 MG) BY MOUTH AT BEDTIME, Disp: 90 tablet, Rfl: 0 .  Continuous Blood Gluc Receiver (FREESTYLE LIBRE READER) DEVI, by Does not apply route. CGM device-Freestyle Libre, Disp: , Rfl:  .  Continuous Blood Gluc Sensor (FREESTYLE LIBRE 14 DAY SENSOR) MISC, by Does not apply route. CGM 14-day sensors; send refills to [facility] (in Epic), Disp: , Rfl:  .  docusate sodium (COLACE) 100 MG capsule, Take 100 mg by mouth daily. , Disp: , Rfl:  .  gabapentin (NEURONTIN) 300 MG capsule, TAKE 1 CAPSULE(300 MG) BY MOUTH THREE TIMES DAILY, Disp: 90 capsule, Rfl: 1 .  insulin lispro (HUMALOG) 100 UNIT/ML injection, Inject 0.25-0.35 mLs (25-35 Units total) into the skin See admin instructions. Inject 30 units subcutaneously before breakfast, inject 35 units before lunch and 25 units before supper, Disp: 40 mL, Rfl: 2 .  Lancets Misc. MISC, 1 each by Does not apply route 4 (four) times daily -  with meals and at bedtime., Disp: 300 each, Rfl: 3 .  losartan (COZAAR) 50 MG tablet, Take 1 tablet (50 mg total) by mouth daily., Disp: 90 tablet, Rfl: 3 .  mirabegron ER (MYRBETRIQ) 50 MG TB24 tablet, Take 1 tablet (50 mg total) by mouth daily., Disp: 90 tablet, Rfl: 0 .  Multiple Vitamin (MULTIVITAMIN WITH MINERALS) TABS tablet, Take 1 tablet by mouth daily., Disp: , Rfl:  .  naproxen sodium (ALEVE) 220 MG tablet, Take 220 mg by mouth 2 (two) times daily as needed (pain)., Disp: , Rfl:  .  Nebivolol HCl (BYSTOLIC) 20 MG TABS, Take 1 tablet (20 mg total) by mouth daily., Disp: 90 tablet, Rfl: 1 .  pantoprazole (PROTONIX) 40 MG tablet, Take 30- 60 min before your first and last meals of the day (Patient taking differently: Take 40 mg by mouth daily before breakfast. ), Disp: 60 tablet, Rfl: 11 .  pravastatin (PRAVACHOL) 80 MG tablet, Take 0.5  tablets (40 mg total) by mouth daily., Disp: 90 tablet, Rfl: 2 .  Semaglutide, 1 MG/DOSE, (OZEMPIC, 1 MG/DOSE,) 2 MG/1.5ML SOPN, Inject 1 mg into the skin every Thursday., Disp: 3 pen, Rfl: 3 .  spironolactone (ALDACTONE) 50 MG tablet, TAKE 1 TABLET(50 MG) BY MOUTH DAILY, Disp: 90 tablet, Rfl: 0 .  Vitamin D, Ergocalciferol, (DRISDOL) 1.25 MG (50000 UNIT) CAPS capsule, TAKE 1 CAPSULE BY MOUTH EVERY 7 DAYS ON WEDNESDAYS, Disp: 5 capsule, Rfl: 0   Allergies  Allergen Reactions  . Pollen Extract Other (See Comments)    Congestion/sneezing  . Tomato Hives and Itching  . Codeine Hives, Itching and Nausea And Vomiting     Review of Systems  Constitutional: Negative for activity change.  HENT: Negative.   Eyes: Negative for blurred vision.  Respiratory: Negative.  Negative for cough.   Cardiovascular: Negative for chest pain, palpitations and leg swelling.  Endocrine: Negative  for polydipsia, polyphagia and polyuria.  Genitourinary: Negative.   Musculoskeletal: Negative.   Skin: Negative.   Neurological: Negative.  Negative for dizziness and headaches.  Hematological: Negative.   Psychiatric/Behavioral: Negative.      Today's Vitals   12/31/19 1429  BP: (!) 142/70  Pulse: 87  Temp: 98.3 F (36.8 C)  Weight: 166 lb 14.2 oz (75.7 kg)  Height: 5\' 6"  (1.676 m)   Body mass index is 26.94 kg/m.   Objective:  Physical Exam Constitutional:      Appearance: Normal appearance. She is well-developed.  HENT:     Head: Normocephalic and atraumatic.     Right Ear: Hearing, tympanic membrane, ear canal and external ear normal. There is no impacted cerumen.     Left Ear: Hearing, tympanic membrane, ear canal and external ear normal. There is no impacted cerumen.     Ears:     Comments: She has excess cerumen but not hard or completely blocking the ear canal    Nose:     Comments: Deferred - masked    Mouth/Throat:     Comments: Deferred - masked  Eyes:     General: Lids are normal.      Extraocular Movements: Extraocular movements intact.     Conjunctiva/sclera: Conjunctivae normal.     Pupils: Pupils are equal, round, and reactive to light.     Funduscopic exam:    Right eye: No papilledema.        Left eye: No papilledema.  Neck:     Thyroid: No thyroid mass.     Vascular: No carotid bruit.  Cardiovascular:     Rate and Rhythm: Normal rate and regular rhythm.     Pulses: Normal pulses.     Heart sounds: Normal heart sounds. No murmur.  Pulmonary:     Effort: Pulmonary effort is normal.     Breath sounds: Normal breath sounds.  Abdominal:     General: Abdomen is flat. Bowel sounds are normal.     Palpations: Abdomen is soft.  Musculoskeletal:        General: No swelling. Normal range of motion.     Cervical back: Full passive range of motion without pain, normal range of motion and neck supple.     Right lower leg: No edema.     Left lower leg: No edema.  Skin:    General: Skin is warm and dry.     Capillary Refill: Capillary refill takes less than 2 seconds.  Neurological:     General: No focal deficit present.     Mental Status: She is alert and oriented to person, place, and time.     Cranial Nerves: No cranial nerve deficit.     Sensory: No sensory deficit.  Psychiatric:        Mood and Affect: Mood normal.        Behavior: Behavior normal.        Thought Content: Thought content normal.        Judgment: Judgment normal.         Assessment And Plan:    1. Routine general medical examination at a health care facility . Behavior modifications discussed and diet history reviewed.   . Pt will continue to exercise regularly and modify diet with low GI, plant based foods and decrease intake of processed foods.  . Recommend intake of daily multivitamin, Vitamin D, and calcium.  . Recommend mammogram for preventive screenings, as well as recommend immunizations that include  influenza, TDAP ( up to date)  2. Uncontrolled type 2 diabetes mellitus  with hyperglycemia (HCC)  Chronic, blood sugars have been more elevated recently  Will check HgbA1c - Hemoglobin A1c - Lipid panel - Comprehensive metabolic panel - CBC - dapagliflozin propanediol (FARXIGA) 5 MG TABS tablet; Take 5 mg by mouth daily before breakfast.  Dispense: 90 tablet; Refill: 1  3. Essential hypertension  Chronic, fair control  Continue with current medications  4. Vitamin D deficiency  Will check vitamin D level and supplement as needed.     Also encouraged to spend 15 minutes in the sun daily.  - Vitamin D (25 hydroxy)  5. Chronic pain of left knee  She is to use the pain cream to her knee as needed  If worsens will consider referral to orthopedics,   No abnormal findings.     [name], FNP    THE PATIENT IS ENCOURAGED TO PRACTICE SOCIAL DISTANCING DUE TO THE COVID-19 PANDEMIC.

## 2019-12-31 NOTE — Patient Instructions (Signed)
[name] , Thank you for taking time to come for your Medicare Wellness Visit. I appreciate your ongoing commitment to your health goals. Please review the following plan we discussed and let me know if I can assist you in the future.   Screening recommendations/referrals: Colonoscopy: 11/2016 Mammogram: 06/2019 Bone Density: 06/2019 Recommended yearly ophthalmology/optometry visit for glaucoma screening and checkup Recommended yearly dental visit for hygiene and checkup  Vaccinations: Influenza vaccine: 06/2019 Pneumococcal vaccine: 02/2019 Tdap vaccine: 12/2017 Shingles vaccine: discussed    Advanced directives: Advance directive discussed with you today. Even though you declined this today please call our office should you change your mind and we can give you the proper paperwork for you to fill out.  Conditions/risks identified: overweight  Next appointment: 05/03/2020 at 2:15   Preventive Care 9 Years and Older, Female Preventive care refers to lifestyle choices and visits with your health care provider that can promote health and wellness. What does preventive care include?  A yearly physical exam. This is also called an annual well check.  Dental exams once or twice a year.  Routine eye exams. Ask your health care provider how often you should have your eyes checked.  Personal lifestyle choices, including:  Daily care of your teeth and gums.  Regular physical activity.  Eating a healthy diet.  Avoiding tobacco and drug use.  Limiting alcohol use.  Practicing safe sex.  Taking low-dose aspirin every day.  Taking vitamin and mineral supplements as recommended by your health care provider. What happens during an annual well check? The services and screenings done by your health care provider during your annual well check will depend on your age, overall health, lifestyle risk factors, and family history of disease. Counseling  Your health care provider may ask  you questions about your:  Alcohol use.  Tobacco use.  Drug use.  Emotional well-being.  Home and relationship well-being.  Sexual activity.  Eating habits.  History of falls.  Memory and ability to understand (cognition).  Work and work Statistician.  Reproductive health. Screening  You may have the following tests or measurements:  Height, weight, and BMI.  Blood pressure.  Lipid and cholesterol levels. These may be checked every 5 years, or more frequently if you are over 36 years old.  Skin check.  Lung cancer screening. You may have this screening every year starting at age 11 if you have a 30-pack-year history of smoking and currently smoke or have quit within the past 15 years.  Fecal occult blood test (FOBT) of the stool. You may have this test every year starting at age 22.  Flexible sigmoidoscopy or colonoscopy. You may have a sigmoidoscopy every 5 years or a colonoscopy every 10 years starting at age 23.  Hepatitis C blood test.  Hepatitis B blood test.  Sexually transmitted disease (STD) testing.  Diabetes screening. This is done by checking your blood sugar (glucose) after you have not eaten for a while (fasting). You may have this done every 1-3 years.  Bone density scan. This is done to screen for osteoporosis. You may have this done starting at age 72.  Mammogram. This may be done every 1-2 years. Talk to your health care provider about how often you should have regular mammograms. Talk with your health care provider about your test results, treatment options, and if necessary, the need for more tests. Vaccines  Your health care provider may recommend certain vaccines, such as:  Influenza vaccine. This is recommended every year.  Tetanus, diphtheria, and acellular pertussis (Tdap, Td) vaccine. You may need a Td booster every 10 years.  Zoster vaccine. You may need this after age 53.  Pneumococcal 13-valent conjugate (PCV13) vaccine. One dose  is recommended after age 28.  Pneumococcal polysaccharide (PPSV23) vaccine. One dose is recommended after age 72. Talk to your health care provider about which screenings and vaccines you need and how often you need them. This information is not intended to replace advice given to you by your health care provider. Make sure you discuss any questions you have with your health care provider. Document Released: 09/09/2015 Document Revised: 05/02/2016 Document Reviewed: 06/14/2015 Elsevier Interactive Patient Education  2017 [place] Prevention in the Home Falls can cause injuries. They can happen to people of all ages. There are many things you can do to make your home safe and to help prevent falls. What can I do on the outside of my home?  Regularly fix the edges of walkways and driveways and fix any cracks.  Remove anything that might make you trip as you walk through a door, such as a raised step or threshold.  Trim any bushes or trees on the path to your home.  Use bright outdoor lighting.  Clear any walking paths of anything that might make someone trip, such as rocks or tools.  Regularly check to see if handrails are loose or broken. Make sure that both sides of any steps have handrails.  Any raised decks and porches should have guardrails on the edges.  Have any leaves, snow, or ice cleared regularly.  Use sand or salt on walking paths during winter.  Clean up any spills in your garage right away. This includes oil or grease spills. What can I do in the bathroom?  Use night lights.  Install grab bars by the toilet and in the tub and shower. Do not use towel bars as grab bars.  Use non-skid mats or decals in the tub or shower.  If you need to sit down in the shower, use a plastic, non-slip stool.  Keep the floor dry. Clean up any water that spills on the floor as soon as it happens.  Remove soap buildup in the tub or shower regularly.  Attach bath mats  securely with double-sided non-slip rug tape.  Do not have throw rugs and other things on the floor that can make you trip. What can I do in the bedroom?  Use night lights.  Make sure that you have a light by your bed that is easy to reach.  Do not use any sheets or blankets that are too big for your bed. They should not hang down onto the floor.  Have a firm chair that has side arms. You can use this for support while you get dressed.  Do not have throw rugs and other things on the floor that can make you trip. What can I do in the kitchen?  Clean up any spills right away.  Avoid walking on wet floors.  Keep items that you use a lot in easy-to-reach places.  If you need to reach something above you, use a strong step stool that has a grab bar.  Keep electrical cords out of the way.  Do not use floor polish or wax that makes floors slippery. If you must use wax, use non-skid floor wax.  Do not have throw rugs and other things on the floor that can make you trip. What can I do  with my stairs?  Do not leave any items on the stairs.  Make sure that there are handrails on both sides of the stairs and use them. Fix handrails that are broken or loose. Make sure that handrails are as long as the stairways.  Check any carpeting to make sure that it is firmly attached to the stairs. Fix any carpet that is loose or worn.  Avoid having throw rugs at the top or bottom of the stairs. If you do have throw rugs, attach them to the floor with carpet tape.  Make sure that you have a light switch at the top of the stairs and the bottom of the stairs. If you do not have them, ask someone to add them for you. What else can I do to help prevent falls?  Wear shoes that:  Do not have high heels.  Have rubber bottoms.  Are comfortable and fit you well.  Are closed at the toe. Do not wear sandals.  If you use a stepladder:  Make sure that it is fully opened. Do not climb a closed  stepladder.  Make sure that both sides of the stepladder are locked into place.  Ask someone to hold it for you, if possible.  Clearly mark and make sure that you can see:  Any grab bars or handrails.  First and last steps.  Where the edge of each step is.  Use tools that help you move around (mobility aids) if they are needed. These include:  Canes.  Walkers.  Scooters.  Crutches.  Turn on the lights when you go into a dark area. Replace any light bulbs as soon as they burn out.  Set up your furniture so you have a clear path. Avoid moving your furniture around.  If any of your floors are uneven, fix them.  If there are any pets around you, be aware of where they are.  Review your medicines with your doctor. Some medicines can make you feel dizzy. This can increase your chance of falling. Ask your doctor what other things that you can do to help prevent falls. This information is not intended to replace advice given to you by your health care provider. Make sure you discuss any questions you have with your health care provider. Document Released: 06/09/2009 Document Revised: 01/19/2016 Document Reviewed: 09/17/2014 Elsevier Interactive Patient Education  2017 [employer].

## 2019-12-31 NOTE — Patient Instructions (Signed)
Health Maintenance, Female Adopting a healthy lifestyle and getting preventive care are important in promoting health and wellness. Ask your health care provider about:  The right schedule for you to have regular tests and exams.  Things you can do on your own to prevent diseases and keep yourself healthy. What should I know about diet, weight, and exercise? Eat a healthy diet   Eat a diet that includes plenty of vegetables, fruits, low-fat dairy products, and lean protein.  Do not eat a lot of foods that are high in solid fats, added sugars, or sodium. Maintain a healthy weight Body mass index (BMI) is used to identify weight problems. It estimates body fat based on height and weight. Your health care provider can help determine your BMI and help you achieve or maintain a healthy weight. Get regular exercise Get regular exercise. This is one of the most important things you can do for your health. Most adults should:  Exercise for at least 150 minutes each week. The exercise should increase your heart rate and make you sweat (moderate-intensity exercise).  Do strengthening exercises at least twice a week. This is in addition to the moderate-intensity exercise.  Spend less time sitting. Even light physical activity can be beneficial. Watch cholesterol and blood lipids Have your blood tested for lipids and cholesterol at 68 years of age, then have this test every 5 years. Have your cholesterol levels checked more often if:  Your lipid or cholesterol levels are high.  You are older than 68 years of age.  You are at high risk for heart disease. What should I know about cancer screening? Depending on your health history and family history, you may need to have cancer screening at various ages. This may include screening for:  Breast cancer.  Cervical cancer.  Colorectal cancer.  Skin cancer.  Lung cancer. What should I know about heart disease, diabetes, and high blood  pressure? Blood pressure and heart disease  High blood pressure causes heart disease and increases the risk of stroke. This is more likely to develop in people who have high blood pressure readings, are of African descent, or are overweight.  Have your blood pressure checked: ? Every 3-5 years if you are 18-39 years of age. ? Every year if you are 40 years old or older. Diabetes Have regular diabetes screenings. This checks your fasting blood sugar level. Have the screening done:  Once every three years after age 40 if you are at a normal weight and have a low risk for diabetes.  More often and at a younger age if you are overweight or have a high risk for diabetes. What should I know about preventing infection? Hepatitis B If you have a higher risk for hepatitis B, you should be screened for this virus. Talk with your health care provider to find out if you are at risk for hepatitis B infection. Hepatitis C Testing is recommended for:  Everyone born from 1945 through 1965.  Anyone with known risk factors for hepatitis C. Sexually transmitted infections (STIs)  Get screened for STIs, including gonorrhea and chlamydia, if: ? You are sexually active and are younger than 68 years of age. ? You are older than 68 years of age and your health care provider tells you that you are at risk for this type of infection. ? Your sexual activity has changed since you were last screened, and you are at increased risk for chlamydia or gonorrhea. Ask your health care provider if   you are at risk.  Ask your health care provider about whether you are at high risk for HIV. Your health care provider may recommend a prescription medicine to help prevent HIV infection. If you choose to take medicine to prevent HIV, you should first get tested for HIV. You should then be tested every 3 months for as long as you are taking the medicine. Pregnancy  If you are about to stop having your period (premenopausal) and  you may become pregnant, seek counseling before you get pregnant.  Take 400 to 800 micrograms (mcg) of folic acid every day if you become pregnant.  Ask for birth control (contraception) if you want to prevent pregnancy. Osteoporosis and menopause Osteoporosis is a disease in which the bones lose minerals and strength with aging. This can result in bone fractures. If you are 65 years old or older, or if you are at risk for osteoporosis and fractures, ask your health care provider if you should:  Be screened for bone loss.  Take a calcium or vitamin D supplement to lower your risk of fractures.  Be given hormone replacement therapy (HRT) to treat symptoms of menopause. Follow these instructions at home: Lifestyle  Do not use any products that contain nicotine or tobacco, such as cigarettes, e-cigarettes, and chewing tobacco. If you need help quitting, ask your health care provider.  Do not use street drugs.  Do not share needles.  Ask your health care provider for help if you need support or information about quitting drugs. Alcohol use  Do not drink alcohol if: ? Your health care provider tells you not to drink. ? You are pregnant, may be pregnant, or are planning to become pregnant.  If you drink alcohol: ? Limit how much you use to 0-1 drink a day. ? Limit intake if you are breastfeeding.  Be aware of how much alcohol is in your drink. In the [place] one drink equals one 12 oz bottle of beer (355 mL), one 5 oz glass of wine (148 mL), or one 1 oz glass of hard liquor (44 mL). General instructions  Schedule regular health, dental, and eye exams.  Stay current with your vaccines.  Tell your health care provider if: ? You often feel depressed. ? You have ever been abused or do not feel safe at home. Summary  Adopting a healthy lifestyle and getting preventive care are important in promoting health and wellness.  Follow your health care provider's instructions about healthy  diet, exercising, and getting tested or screened for diseases.  Follow your health care provider's instructions on monitoring your cholesterol and blood pressure. This information is not intended to replace advice given to you by your health care provider. Make sure you discuss any questions you have with your health care provider. Document Revised: 08/06/2018 Document Reviewed: 08/06/2018 Elsevier Patient Education  2020 Elsevier Inc.  

## 2020-01-01 LAB — COMPREHENSIVE METABOLIC PANEL
ALT: 14 IU/L (ref 0–32)
AST: 16 IU/L (ref 0–40)
Albumin/Globulin Ratio: 1.6 (ref 1.2–2.2)
Albumin: 4.1 g/dL (ref 3.8–4.8)
Alkaline Phosphatase: 124 IU/L — ABNORMAL HIGH (ref 39–117)
BUN/Creatinine Ratio: 11 — ABNORMAL LOW (ref 12–28)
BUN: 15 mg/dL (ref 8–27)
Bilirubin Total: 0.3 mg/dL (ref 0.0–1.2)
CO2: 28 mmol/L (ref 20–29)
Calcium: 9.6 mg/dL (ref 8.7–10.3)
Chloride: 97 mmol/L (ref 96–106)
Creatinine, Ser: 1.34 mg/dL — ABNORMAL HIGH (ref 0.57–1.00)
GFR calc Af Amer: 47 mL/min/{1.73_m2} — ABNORMAL LOW (ref >59)
GFR calc non Af Amer: 41 mL/min/{1.73_m2} — ABNORMAL LOW (ref >59)
Globulin, Total: 2.6 g/dL (ref 1.5–4.5)
Glucose: 214 mg/dL — ABNORMAL HIGH (ref 65–99)
Potassium: 4.4 mmol/L (ref 3.5–5.2)
Sodium: 139 mmol/L (ref 134–144)
Total Protein: 6.7 g/dL (ref 6.0–8.5)

## 2020-01-01 LAB — LIPID PANEL
Chol/HDL Ratio: 3.1 ratio (ref 0.0–4.4)
Cholesterol, Total: 168 mg/dL (ref 100–199)
HDL: 54 mg/dL (ref >39)
LDL Chol Calc (NIH): 98 mg/dL (ref 0–99)
Triglycerides: 88 mg/dL (ref 0–149)
VLDL Cholesterol Cal: 16 mg/dL (ref 5–40)

## 2020-01-01 LAB — CBC
Hematocrit: 41.9 % (ref 34.0–46.6)
Hemoglobin: 13.2 g/dL (ref 11.1–15.9)
MCH: 27.6 pg (ref 26.6–33.0)
MCHC: 31.5 g/dL (ref 31.5–35.7)
MCV: 88 fL (ref 79–97)
Platelets: 187 10*3/uL (ref 150–450)
RBC: 4.78 x10E6/uL (ref 3.77–5.28)
RDW: 13.1 % (ref 11.7–15.4)
WBC: 6.8 10*3/uL (ref 3.4–10.8)

## 2020-01-01 LAB — HEMOGLOBIN A1C
Est. average glucose Bld gHb Est-mCnc: 123 mg/dL
Hgb A1c MFr Bld: 5.9 % — ABNORMAL HIGH (ref 4.8–5.6)

## 2020-01-01 LAB — VITAMIN D 25 HYDROXY (VIT D DEFICIENCY, FRACTURES): Vit D, 25-Hydroxy: 41.4 ng/mL (ref 30.0–100.0)

## 2020-01-02 ENCOUNTER — Other Ambulatory Visit: Payer: Self-pay | Admitting: Nurse Practitioner

## 2020-01-07 DIAGNOSIS — R350 Frequency of micturition: Secondary | ICD-10-CM | POA: Diagnosis not present

## 2020-01-07 DIAGNOSIS — N3020 Other chronic cystitis without hematuria: Secondary | ICD-10-CM | POA: Diagnosis not present

## 2020-01-15 NOTE — Patient Instructions (Addendum)
Visit Information  Goals Addressed            This Visit's Progress   . Pharmacy Care Plan       CARE PLAN ENTRY  Current Barriers:  . Chronic Disease Management support, education, and care coordination needs related to Hypertension, Hyperlipidemia, Diabetes, Gastroesophageal Reflux Disease, Asthma, and Chronic Kidney Disease   Hypertension . Pharmacist Clinical Goal(s): o Over the next 45 days, patient will work with PharmD and providers to achieve BP goal <130/80 . Current regimen:  o Amlodipine 10mg  daily o Spironolactone 50mg  daily . Interventions: o Collaborate with PCP regarding losartan prescription on profile, but patient states has not been filled since 2017. No fill history available for losartan from pharmacy.  . Patient self care activities - Over the next 45 days, patient will: o Check BP once weekly and if symptomatic, document, and provide at future appointments o Ensure daily salt intake < 2300 mg/day  Hyperlipidemia . Pharmacist Clinical Goal(s): o Over the next 45 days, patient will work with PharmD and providers to achieve LDL goal < 70 . Current regimen:  o Pravastatin 80mg  1/2 tablet daily . Interventions: o Provided extensive dietary and exercise recommendations . Patient self care activities - Over the next 45 days, patient will: o Focus on a heart-healthy, balanced diet as discussed  Diabetes . Pharmacist Clinical Goal(s): o Over the next 45 days, patient will work with PharmD and providers to achieve A1c goal <7% . Current regimen:  o Ozempic 1mg  weekly o Humalog 25 units before breakfast, 35 units before lunch, and 25 units before dinner . Interventions: o Dietary and exercise recommendations provided . Patient self care activities - Over the next 45 days, patient will: o Check blood sugar continuously, but at least 3-4 times daily, document, and provide at future appointments o Contact provider with any episodes of hypoglycemia o Reduce the  carbohydrates she eats on a daily basis o Increase water intake by 1 glass a day o Eat more lean protein (chicken,fish)  Medication management . Pharmacist Clinical Goal(s): o Over the next 45 days, patient will work with PharmD and providers to achieve optimal medication adherence . Current pharmacy: [facility] . Interventions o Comprehensive medication review performed. o Utilize [facility] for medication synchronization, packaging and delivery . Patient self care activities - Over the next 45 days, patient will: o Focus on medication adherence by using adherence packaging o Take medications as prescribed o Report any questions or concerns to PharmD and/or provider(s)  Initial goal documentation        Ms. [name] was given information about Chronic Care Management services today including:  1. CCM service includes personalized support from designated clinical staff supervised by her physician, including individualized plan of care and coordination with other care providers 2. 24/7 contact phone numbers for assistance for urgent and routine care needs. 3. Standard insurance, coinsurance, copays and deductibles apply for chronic care management only during months in which we provide at least 20 minutes of these services. Most insurances cover these services at 100%, however patients may be responsible for any copay, coinsurance and/or deductible if applicable. This service may help you avoid the need for more expensive face-to-face services. 4. Only one practitioner may furnish and bill the service in a calendar month. 5. The patient may stop CCM services at any time (effective at the end of the month) by phone call to the office staff.  Patient agreed to services and verbal consent obtained.  The patient verbalized understanding of instructions provided today and agreed to receive a mailed copy of patient instruction and/or educational materials. Telephone follow up  appointment with pharmacy team member scheduled for: 02/16/20 @ [place], PharmD Clinical Pharmacist [facility] [phone]   Diabetes Mellitus and Nutrition, Adult When you have diabetes (diabetes mellitus), it is very important to have healthy eating habits because your blood sugar (glucose) levels are greatly affected by what you eat and drink. Eating healthy foods in the appropriate amounts, at about the same times every day, can help you:  Control your blood glucose.  Lower your risk of heart disease.  Improve your blood pressure.  Reach or maintain a healthy weight. Every person with diabetes is different, and each person has different needs for a meal plan. Your health care provider may recommend that you work with a diet and nutrition specialist (dietitian) to make a meal plan that is best for you. Your meal plan may vary depending on factors such as:  The calories you need.  The medicines you take.  Your weight.  Your blood glucose, blood pressure, and cholesterol levels.  Your activity level.  Other health conditions you have, such as heart or kidney disease. How do carbohydrates affect me? Carbohydrates, also called carbs, affect your blood glucose level more than any other type of food. Eating carbs naturally raises the amount of glucose in your blood. Carb counting is a method for keeping track of how many carbs you eat. Counting carbs is important to keep your blood glucose at a healthy level, especially if you use insulin or take certain oral diabetes medicines. It is important to know how many carbs you can safely have in each meal. This is different for every person. Your dietitian can help you calculate how many carbs you should have at each meal and for each snack. Foods that contain carbs include:  Bread, cereal, rice, pasta, and crackers.  Potatoes and corn.  Peas, beans, and lentils.  Milk and yogurt.  Fruit and  juice.  Desserts, such as cakes, cookies, ice cream, and candy. How does alcohol affect me? Alcohol can cause a sudden decrease in blood glucose (hypoglycemia), especially if you use insulin or take certain oral diabetes medicines. Hypoglycemia can be a life-threatening condition. Symptoms of hypoglycemia (sleepiness, dizziness, and confusion) are similar to symptoms of having too much alcohol. If your health care provider says that alcohol is safe for you, follow these guidelines:  Limit alcohol intake to no more than 1 drink per day for nonpregnant women and 2 drinks per day for men. One drink equals 12 oz of beer, 5 oz of wine, or 1 oz of hard liquor.  Do not drink on an empty stomach.  Keep yourself hydrated with water, diet soda, or unsweetened iced tea.  Keep in mind that regular soda, juice, and other mixers may contain a lot of sugar and must be counted as carbs. What are tips for following this plan?  Reading food labels  Start by checking the serving size on the "Nutrition Facts" label of packaged foods and drinks. The amount of calories, carbs, fats, and other nutrients listed on the label is based on one serving of the item. Many items contain more than one serving per package.  Check the total grams (g) of carbs in one serving. You can calculate the number of servings of carbs in one serving by dividing the total carbs by 15. For example, if  a food has 30 g of total carbs, it would be equal to 2 servings of carbs.  Check the number of grams (g) of saturated and trans fats in one serving. Choose foods that have low or no amount of these fats.  Check the number of milligrams (mg) of salt (sodium) in one serving. Most people should limit total sodium intake to less than 2,300 mg per day.  Always check the nutrition information of foods labeled as "low-fat" or "nonfat". These foods may be higher in added sugar or refined carbs and should be avoided.  Talk to your dietitian to  identify your daily goals for nutrients listed on the label. Shopping  Avoid buying canned, premade, or processed foods. These foods tend to be high in fat, sodium, and added sugar.  Shop around the outside edge of the grocery store. This includes fresh fruits and vegetables, bulk grains, fresh meats, and fresh dairy. Cooking  Use low-heat cooking methods, such as baking, instead of high-heat cooking methods like deep frying.  Cook using healthy oils, such as olive, canola, or sunflower oil.  Avoid cooking with butter, cream, or high-fat meats. Meal planning  Eat meals and snacks regularly, preferably at the same times every day. Avoid going long periods of time without eating.  Eat foods high in fiber, such as fresh fruits, vegetables, beans, and whole grains. Talk to your dietitian about how many servings of carbs you can eat at each meal.  Eat 4-6 ounces (oz) of lean protein each day, such as lean meat, chicken, fish, eggs, or tofu. One oz of lean protein is equal to: ? 1 oz of meat, chicken, or fish. ? 1 egg. ?  cup of tofu.  Eat some foods each day that contain healthy fats, such as avocado, nuts, seeds, and fish. Lifestyle  Check your blood glucose regularly.  Exercise regularly as told by your health care provider. This may include: ? 150 minutes of moderate-intensity or vigorous-intensity exercise each week. This could be brisk walking, biking, or water aerobics. ? Stretching and doing strength exercises, such as yoga or weightlifting, at least 2 times a week.  Take medicines as told by your health care provider.  Do not use any products that contain nicotine or tobacco, such as cigarettes and e-cigarettes. If you need help quitting, ask your health care provider.  Work with a Social worker or diabetes educator to identify strategies to manage stress and any emotional and social challenges. Questions to ask a health care provider  Do I need to meet with a diabetes  educator?  Do I need to meet with a dietitian?  What number can I call if I have questions?  When are the best times to check my blood glucose? Where to find more information:  American Diabetes Association: diabetes.org  Academy of Nutrition and Dietetics: www.eatright.[employer] of Diabetes and Digestive and Kidney Diseases (NIH): [employer] Summary  A healthy meal plan will help you control your blood glucose and maintain a healthy lifestyle.  Working with a diet and nutrition specialist (dietitian) can help you make a meal plan that is best for you.  Keep in mind that carbohydrates (carbs) and alcohol have immediate effects on your blood glucose levels. It is important to count carbs and to use alcohol carefully. This information is not intended to replace advice given to you by your health care provider. Make sure you discuss any questions you have with your health care provider. Document Revised: 07/26/2017 Document  Reviewed: 09/17/2016 Elsevier Patient Education  [employer].

## 2020-01-18 ENCOUNTER — Ambulatory Visit (INDEPENDENT_AMBULATORY_CARE_PROVIDER_SITE_OTHER): Payer: Medicare Other

## 2020-01-18 ENCOUNTER — Telehealth: Payer: Self-pay

## 2020-01-18 ENCOUNTER — Other Ambulatory Visit: Payer: Self-pay

## 2020-01-18 DIAGNOSIS — E1165 Type 2 diabetes mellitus with hyperglycemia: Secondary | ICD-10-CM | POA: Diagnosis not present

## 2020-01-18 DIAGNOSIS — I10 Essential (primary) hypertension: Secondary | ICD-10-CM | POA: Diagnosis not present

## 2020-01-18 DIAGNOSIS — E785 Hyperlipidemia, unspecified: Secondary | ICD-10-CM

## 2020-01-28 NOTE — Patient Instructions (Signed)
Visit Information  Goals Addressed      Patient Stated   . COMPLETED: "I am having problems swallowing" (pt-stated)       Current Barriers:  [name] Knowledge Deficits related to diagnosis and treatment management of Impaired swallowing  . Chronic Disease Management support and education needs related to DM, HTN, HLD  Nurse Case Manager Clinical Goal(s):  [name] Over the next 30 days, patient will verbalize understanding of plan for evaluation and treatment of Impaired swallowing  Goal Met  . Over the next 60 days, patient will not experience any complications secondary to having Impaired swallowing such as aspiration and or weight loss/dehydration Goal Met  CCM RN CM Interventions:  07/17/19 call completed with patient  . Evaluation of current treatment plan related to Impaired Swallowing and patient's adherence to plan as established by provider . Determined patient underwent an upper GI Endoscopy on 11/03/19 at the [facility] per Dr. 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 Determined this procedure was uneventful and [name] was instructed to resume her normal diet and medication regimen . Determined she will fu with GI as instructed and or if her symptoms reoccur . Discussed plans with patient for ongoing care management follow up and provided patient with direct contact information for care management team  Patient Self Care Activities:  . Self administers medications as prescribed . Attends all scheduled provider appointments . Calls pharmacy for medication refills . Performs ADL's independently . Performs IADL's independently . Calls provider office for new concerns or questions  Please see past updates related to this goal by clicking on the "Past Updates" button in the selected goal      . "to keep my A1c within normal range" (pt-stated)        . CARE PLAN ENTRY . (see longitudinal plan of care for  additional care plan information) Current Barriers:  [name] Knowledge Deficits related to disease process and Self Health management of DM . Chronic Disease Management support and education needs related to DM, HTN, HLD   Nurse Case Manager Clinical Goal(s):  [name] Over the next 90 days, patient will work with the CCM team to address needs related to disease education and support to help improve patient's Self Health Management of her DM as evidence by patient will maintain an A1c <7.0 %  Interventions:  . Inter-disciplinary care team collaboration (see longitudinal plan of care) . Evaluation of current treatment plan related to DM and patient's adherence to plan as established by provider o Reviewed and discussed current A1c has decreased to 5.9 %; Positive reinforcement provided to patient for making efforts to improve her DM management and lowering her A1c o Reviewed and reinforced importance of following ADA Dietary and exercise recommendations o Instructed patient to continue to check blood sugar continuously, but at least 3-4 times daily, document, and provide at future appointments o Instructed patient to contact the CCM team and or PCP provider with any episodes of hypoglycemia o Reviewed and discussed medication adherence, patient reports 100% adherence, she is prescribed to take Ozempic 58m weekly and Humalog 25 units before breakfast, 35 units before lunch, and 25 units before dinner . Discussed plans with patient for ongoing care management follow up and provided patient with direct contact information for care management team . Provided patient with printed educational materials related to Diabetes Care Guide, 6 Ways to be Water Wise   Patient Self Care Activities:  . Self  administers medications as prescribed . Attends all scheduled provider appointments . Calls pharmacy for medication refills . Performs ADL's independently . Performs IADL's independently . Calls provider office for new  concerns or questions  Please see past updates related to this goal by clicking on the "Past Updates" button in the selected goal      . COMPLETED: I want to control my diabetes (pt-stated)       Current Barriers:  [name] Knowledge Deficits related to diabetes diet & lifestyle modifications . Non Adherence to prescribed medication regimen  Pharmacist Clinical Goal(s):  [name] Over the next 90 days, patient will work with PharmD and PCP to address needs related to optimized medication management of chronic conditions  Goal Met . New - 07/17/19 - Over the next 60 days, patient will experience daily glycemic control to be within target range of 80-130  . New - 07/17/19 - Over the next 90 days, patient will lower her A1C <8.7  Interventions: . Comprehensive medication review performed.  Reviewed medication fill history via insurance claims data. . Reviewed & discussed the following diabetes-related information with patient: o Encouraged patient to continue checking blood sugars as directed-->patient now has scanner glucometer ([employer] [place]), will be able to download data at PCP office during visits.  - She has been without libre supplies x1 week.  She has received refill and reports BGs are improving. o Confirmed current DM regimen: Humalog 30u breakfast, 35u lunch, 25u dinner; Ozempic 25m weekly.  Patient reports she is content with this regimen and is tolerating it well. (increased meal time due to higher post prandials) o Reviewed medication purpose/side effects-->patient denies adverse events, denies hypoglycemia, reports FBG<150.  Denies BG>200.  She has enjoyed scanning her arm for BG vs. Lancet method.   o Continue to follow ADA recommended "diabetes-friendly" diet  (reviewed healthy snack/food options).  Counseled patient to avoid sugary drinks and foods.  She is working to decrease potato & carbohydrates.  She is also able to walk more because she has had her bladder procedure.  Her bladder  condition is improving. o Most recent A1c is 9.3% on 07/22/19 (was 8.7% on 03/19/19, still decreased from 12.0 in March 2020) o Anti-hypertensive regimen: amlodipine, losartan (no fill data) o Anti-hyperlipidemic regimen: pravastatin - Discussed the benefits of taking statin in diabetes.  Patient states she will try pravastatin daily as she has not had statin intolerance in the past.  Refill called into [facility] on 06/15/2019. Continue taking all medications as prescribed by provider.   Patient Self Care Activities:  . Self administers medications as prescribed . Attends all scheduled provider appointments . Calls pharmacy for medication refills . Performs ADL's independently . Calls provider office for new concerns or questions  Please see past updates related to this goal by clicking on the "Past Updates" button in the selected goal       Patient verbalizes understanding of instructions provided today.   Telephone follow up appointment with care management team member scheduled for: 03/15/20  ABarb Merino RN, BSN, CCM Care Management Coordinator [facility]  Direct Phone: [phone]

## 2020-01-28 NOTE — Chronic Care Management (AMB) (Signed)
Chronic Care Management   Follow Up Note   01/19/2020 Name: [name] MRN: [id number] DOB: 25-Mar-1951  Referred by: [name], MD Reason for referral : Chronic Care Management (FU RN CM Call )   [name] is a 69 y.o. year old female who is a primary care patient of [name], MD. The CCM team was consulted for assistance with chronic disease management and care coordination needs.    Review of patient status, including review of consultants reports, relevant laboratory and other test results, and collaboration with appropriate care team members and the patient's provider was performed as part of comprehensive patient evaluation and provision of chronic care management services.    SDOH (Social Determinants of Health) assessments performed: Yes - No Acute Challenges identified at this time  See Care Plan activities for detailed interventions related to [place])   Placed outbound CCM RN CM f/u call to patient for a care plan update.     Outpatient Encounter Medications as of 01/18/2020  Medication Sig  . AMITIZA 8 MCG capsule TAKE 1 CAPSULE(8 MCG) BY MOUTH DAILY WITH BREAKFAST  . amLODipine (NORVASC) 10 MG tablet TAKE 1 TABLET(10 MG) BY MOUTH AT BEDTIME  . Continuous Blood Gluc Receiver (FREESTYLE LIBRE READER) DEVI by Does not apply route. CGM device-Freestyle Libre  . Continuous Blood Gluc Sensor (FREESTYLE LIBRE 14 DAY SENSOR) MISC by Does not apply route. CGM 14-day sensors; send refills to [facility] (in [place])  . dapagliflozin propanediol (FARXIGA) 5 MG TABS tablet Take 5 mg by mouth daily before breakfast.  . docusate sodium (COLACE) 100 MG capsule Take 100 mg by mouth daily.   [name] gabapentin (NEURONTIN) 300 MG capsule TAKE 1 CAPSULE(300 MG) BY MOUTH THREE TIMES DAILY  . insulin lispro (HUMALOG) 100 UNIT/ML injection Inject 0.25-0.35 mLs (25-35 Units total) into the skin See admin instructions. Inject 30 units subcutaneously before breakfast, inject  35 units before lunch and 25 units before supper  . Lancets Misc. MISC 1 each by Does not apply route 4 (four) times daily -  with meals and at bedtime.  . mirabegron ER (MYRBETRIQ) 50 MG TB24 tablet Take 1 tablet (50 mg total) by mouth daily.  . mometasone-formoterol (DULERA) 100-5 MCG/ACT AERO Inhale 2 puffs into the lungs as needed for wheezing.  . Multiple Vitamin (MULTIVITAMIN WITH MINERALS) TABS tablet Take 1 tablet by mouth daily.  . naproxen sodium (ALEVE) 220 MG tablet Take 220 mg by mouth 2 (two) times daily as needed (pain).  . pantoprazole (PROTONIX) 40 MG tablet Take 30- 60 min before your first and last meals of the day (Patient taking differently: Take 40 mg by mouth daily before breakfast. )  . pravastatin (PRAVACHOL) 80 MG tablet Take 0.5 tablets (40 mg total) by mouth daily.  . Semaglutide, 1 MG/DOSE, (OZEMPIC, 1 MG/DOSE,) 2 MG/1.5ML SOPN Inject 1 mg into the skin every Thursday.  [name] spironolactone (ALDACTONE) 50 MG tablet TAKE 1 TABLET(50 MG) BY MOUTH DAILY  . Vitamin D, Ergocalciferol, (DRISDOL) 1.25 MG (50000 UNIT) CAPS capsule TAKE 1 CAPSULE BY MOUTH EVERY 7 DAYS ON WEDNESDAYS  . albuterol (PROAIR HFA) 108 (90 Base) MCG/ACT inhaler 2 puffs every 4 hours as needed only  if your can't catch your breath (Patient not taking: Reported on 12/28/2019)  . losartan (COZAAR) 50 MG tablet Take 1 tablet (50 mg total) by mouth daily. (Patient not taking: Reported on 01/15/2020)  . Nebivolol HCl (BYSTOLIC) 20 MG TABS Take 1 tablet (20 mg total)  by mouth daily. (Patient not taking: Reported on 01/15/2020)   No facility-administered encounter medications on file as of 01/18/2020.     Objective:  Lab Results  Component Value Date   HGBA1C 5.9 (H) 12/31/2019   HGBA1C 9.0 (H) 10/22/2019   HGBA1C 9.3 (H) 07/22/2019   Lab Results  Component Value Date   MICROALBUR 80 12/31/2019   LDLCALC 98 12/31/2019   CREATININE 1.34 (H) 12/31/2019   BP Readings from Last 3 Encounters:  12/31/19 (!)  142/70  12/31/19 (!) 142/70  10/22/19 124/82    Goals Addressed      Patient Stated   . COMPLETED: "I am having problems swallowing" (pt-stated)       Current Barriers:  [name] Knowledge Deficits related to diagnosis and treatment management of Impaired swallowing  . Chronic Disease Management support and education needs related to DM, HTN, HLD  Nurse Case Manager Clinical Goal(s):  [name] Over the next 30 days, patient will verbalize understanding of plan for evaluation and treatment of Impaired swallowing  Goal Met  . Over the next 60 days, patient will not experience any complications secondary to having Impaired swallowing such as aspiration and or weight loss/dehydration Goal Met  CCM RN CM Interventions:  07/17/19 call completed with patient  . Evaluation of current treatment plan related to Impaired Swallowing and patient's adherence to plan as established by provider . Determined patient underwent an upper GI Endoscopy on 11/03/19 at the [facility] per Dr. 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 Determined this procedure was uneventful and [name] was instructed to resume her normal diet and medication regimen . Determined she will fu with GI as instructed and or if her symptoms reoccur . Discussed plans with patient for ongoing care management follow up and provided patient with direct contact information for care management team  Patient Self Care Activities:  . Self administers medications as prescribed . Attends all scheduled provider appointments . Calls pharmacy for medication refills . Performs ADL's independently . Performs IADL's independently . Calls provider office for new concerns or questions  Please see past updates related to this goal by clicking on the "Past Updates" button in the selected goal      . "to keep my A1c within normal range" (pt-stated)        . CARE PLAN  ENTRY . (see longitudinal plan of care for additional care plan information) Current Barriers:  [name] Knowledge Deficits related to disease process and Self Health management of DM . Chronic Disease Management support and education needs related to DM, HTN, HLD   Nurse Case Manager Clinical Goal(s):  [name] Over the next 90 days, patient will work with the CCM team to address needs related to disease education and support to help improve patient's Self Health Management of her DM as evidence by patient will maintain an A1c <7.0 %  Interventions:  . Inter-disciplinary care team collaboration (see longitudinal plan of care) . Evaluation of current treatment plan related to DM and patient's adherence to plan as established by provider o Reviewed and discussed current A1c has decreased to 5.9 %; Positive reinforcement provided to patient for making efforts to improve her DM management and lowering her A1c o Reviewed and reinforced importance of following ADA Dietary and exercise recommendations o Instructed patient to continue to check blood sugar continuously, but at least 3-4 times daily, document, and provide at future appointments o Instructed patient to contact  the CCM team and or PCP provider with any episodes of hypoglycemia o Reviewed and discussed medication adherence, patient reports 100% adherence, she is prescribed to take Ozempic 63m weekly and Humalog 25 units before breakfast, 35 units before lunch, and 25 units before dinner . Discussed plans with patient for ongoing care management follow up and provided patient with direct contact information for care management team . Provided patient with printed educational materials related to Diabetes Care Guide, 6 Ways to be Water Wise   Patient Self Care Activities:  . Self administers medications as prescribed . Attends all scheduled provider appointments . Calls pharmacy for medication refills . Performs ADL's independently . Performs IADL's  independently . Calls provider office for new concerns or questions  Please see past updates related to this goal by clicking on the "Past Updates" button in the selected goal      . COMPLETED: I want to control my diabetes (pt-stated)       Current Barriers:  .Marland KitchenKnowledge Deficits related to diabetes diet & lifestyle modifications . Non Adherence to prescribed medication regimen  Pharmacist Clinical Goal(s):  .Marland KitchenOver the next 90 days, patient will work with PharmD and PCP to address needs related to optimized medication management of chronic conditions  Goal Met . New - 07/17/19 - Over the next 60 days, patient will experience daily glycemic control to be within target range of 80-130  . New - 07/17/19 - Over the next 90 days, patient will lower her A1C <8.7  Interventions: . Comprehensive medication review performed.  Reviewed medication fill history via insurance claims data. . Reviewed & discussed the following diabetes-related information with patient: o Encouraged patient to continue checking blood sugars as directed-->patient now has scanner glucometer (FreeStyle LRotonda, will be able to download data at PCP office during visits.  - She has been without libre supplies x1 week.  She has received refill and reports BGs are improving. o Confirmed current DM regimen: Humalog 30u breakfast, 35u lunch, 25u dinner; Ozempic 124mweekly.  Patient reports she is content with this regimen and is tolerating it well. (increased meal time due to higher post prandials) o Reviewed medication purpose/side effects-->patient denies adverse events, denies hypoglycemia, reports FBG<150.  Denies BG>200.  She has enjoyed scanning her arm for BG vs. Lancet method.   o Continue to follow ADA recommended "diabetes-friendly" diet  (reviewed healthy snack/food options).  Counseled patient to avoid sugary drinks and foods.  She is working to decrease potato & carbohydrates.  She is also able to walk more because she  has had her bladder procedure.  Her bladder condition is improving. o Most recent A1c is 9.3% on 07/22/19 (was 8.7% on 03/19/19, still decreased from 12.0 in March 2020) o Anti-hypertensive regimen: amlodipine, losartan (no fill data) o Anti-hyperlipidemic regimen: pravastatin - Discussed the benefits of taking statin in diabetes.  Patient states she will try pravastatin daily as she has not had statin intolerance in the past.  Refill called into [facility] on 06/15/2019. Continue taking all medications as prescribed by provider.   Patient Self Care Activities:  . Self administers medications as prescribed . Attends all scheduled provider appointments . Calls pharmacy for medication refills . Performs ADL's independently . Calls provider office for new concerns or questions  Please see past updates related to this goal by clicking on the "Past Updates" button in the selected goal         Plan:   Telephone follow up appointment with care  management team member scheduled for: 03/15/20   [name], RN, BSN, CCM Care Management Coordinator [facility]  Direct Phone: [phone]

## 2020-02-01 ENCOUNTER — Other Ambulatory Visit: Payer: Self-pay | Admitting: Nurse Practitioner

## 2020-02-01 DIAGNOSIS — E1165 Type 2 diabetes mellitus with hyperglycemia: Secondary | ICD-10-CM

## 2020-02-05 ENCOUNTER — Other Ambulatory Visit: Payer: Self-pay | Admitting: Nurse Practitioner

## 2020-02-10 ENCOUNTER — Emergency Department (HOSPITAL_COMMUNITY): Payer: Medicare Other

## 2020-02-10 ENCOUNTER — Emergency Department (HOSPITAL_COMMUNITY)
Admission: EM | Admit: 2020-02-10 | Discharge: 2020-02-11 | Disposition: A | Discharge status: Home / Self Care | Payer: Medicare Other | Attending: Emergency Medicine | Admitting: Emergency Medicine

## 2020-02-10 ENCOUNTER — Other Ambulatory Visit: Payer: Self-pay

## 2020-02-10 DIAGNOSIS — R110 Nausea: Secondary | ICD-10-CM | POA: Diagnosis not present

## 2020-02-10 DIAGNOSIS — M5116 Intervertebral disc disorders with radiculopathy, lumbar region: Secondary | ICD-10-CM | POA: Diagnosis not present

## 2020-02-10 DIAGNOSIS — Z794 Long term (current) use of insulin: Secondary | ICD-10-CM | POA: Insufficient documentation

## 2020-02-10 DIAGNOSIS — R52 Pain, unspecified: Secondary | ICD-10-CM | POA: Diagnosis not present

## 2020-02-10 DIAGNOSIS — J45909 Unspecified asthma, uncomplicated: Secondary | ICD-10-CM | POA: Diagnosis not present

## 2020-02-10 DIAGNOSIS — M79604 Pain in right leg: Secondary | ICD-10-CM | POA: Diagnosis present

## 2020-02-10 DIAGNOSIS — I129 Hypertensive chronic kidney disease with stage 1 through stage 4 chronic kidney disease, or unspecified chronic kidney disease: Secondary | ICD-10-CM | POA: Diagnosis not present

## 2020-02-10 DIAGNOSIS — E119 Type 2 diabetes mellitus without complications: Secondary | ICD-10-CM | POA: Insufficient documentation

## 2020-02-10 DIAGNOSIS — N183 Chronic kidney disease, stage 3 unspecified: Secondary | ICD-10-CM | POA: Insufficient documentation

## 2020-02-10 DIAGNOSIS — Z743 Need for continuous supervision: Secondary | ICD-10-CM | POA: Diagnosis not present

## 2020-02-10 DIAGNOSIS — M48061 Spinal stenosis, lumbar region without neurogenic claudication: Secondary | ICD-10-CM | POA: Diagnosis not present

## 2020-02-10 DIAGNOSIS — M791 Myalgia, unspecified site: Secondary | ICD-10-CM | POA: Diagnosis not present

## 2020-02-10 DIAGNOSIS — M25551 Pain in right hip: Secondary | ICD-10-CM | POA: Diagnosis not present

## 2020-02-10 DIAGNOSIS — I10 Essential (primary) hypertension: Secondary | ICD-10-CM | POA: Diagnosis not present

## 2020-02-10 DIAGNOSIS — E1165 Type 2 diabetes mellitus with hyperglycemia: Secondary | ICD-10-CM | POA: Diagnosis not present

## 2020-02-10 DIAGNOSIS — M5126 Other intervertebral disc displacement, lumbar region: Secondary | ICD-10-CM | POA: Diagnosis not present

## 2020-02-10 LAB — BASIC METABOLIC PANEL
Anion gap: 14 (ref 5–15)
BUN: 13 mg/dL (ref 8–23)
CO2: 27 mmol/L (ref 22–32)
Calcium: 9.2 mg/dL (ref 8.9–10.3)
Chloride: 96 mmol/L — ABNORMAL LOW (ref 98–111)
Creatinine, Ser: 1.2 mg/dL — ABNORMAL HIGH (ref 0.44–1.00)
GFR calc Af Amer: 54 mL/min — ABNORMAL LOW (ref >60)
GFR calc non Af Amer: 47 mL/min — ABNORMAL LOW (ref >60)
Glucose, Bld: 240 mg/dL — ABNORMAL HIGH (ref 70–99)
Potassium: 3.9 mmol/L (ref 3.5–5.1)
Sodium: 137 mmol/L (ref 135–145)

## 2020-02-10 LAB — I-STAT CHEM 8, ED
BUN: 15 mg/dL (ref 8–23)
Calcium, Ion: 1.22 mmol/L (ref 1.15–1.40)
Chloride: 97 mmol/L — ABNORMAL LOW (ref 98–111)
Creatinine, Ser: 1.3 mg/dL — ABNORMAL HIGH (ref 0.44–1.00)
Glucose, Bld: 244 mg/dL — ABNORMAL HIGH (ref 70–99)
HCT: 48 % — ABNORMAL HIGH (ref 36.0–46.0)
Hemoglobin: 16.3 g/dL — ABNORMAL HIGH (ref 12.0–15.0)
Potassium: 3.7 mmol/L (ref 3.5–5.1)
Sodium: 139 mmol/L (ref 135–145)
TCO2: 32 mmol/L (ref 22–32)

## 2020-02-10 LAB — CBC WITH DIFFERENTIAL/PLATELET
Abs Immature Granulocytes: 0.03 10*3/uL (ref 0.00–0.07)
Basophils Absolute: 0 10*3/uL (ref 0.0–0.1)
Basophils Relative: 0 %
Eosinophils Absolute: 0 10*3/uL (ref 0.0–0.5)
Eosinophils Relative: 0 %
HCT: 47.4 % — ABNORMAL HIGH (ref 36.0–46.0)
Hemoglobin: 15.5 g/dL — ABNORMAL HIGH (ref 12.0–15.0)
Immature Granulocytes: 0 %
Lymphocytes Relative: 9 %
Lymphs Abs: 0.7 10*3/uL (ref 0.7–4.0)
MCH: 30.2 pg (ref 26.0–34.0)
MCHC: 32.7 g/dL (ref 30.0–36.0)
MCV: 92.2 fL (ref 80.0–100.0)
Monocytes Absolute: 0.3 10*3/uL (ref 0.1–1.0)
Monocytes Relative: 4 %
Neutro Abs: 6.7 10*3/uL (ref 1.7–7.7)
Neutrophils Relative %: 87 %
Platelets: 230 10*3/uL (ref 150–400)
RBC: 5.14 MIL/uL — ABNORMAL HIGH (ref 3.87–5.11)
RDW: 12 % (ref 11.5–15.5)
WBC: 7.8 10*3/uL (ref 4.0–10.5)
nRBC: 0 % (ref 0.0–0.2)

## 2020-02-10 LAB — MAGNESIUM: Magnesium: 2.2 mg/dL (ref 1.7–2.4)

## 2020-02-10 LAB — CK: Total CK: 105 U/L (ref 38–234)

## 2020-02-10 MED ORDER — KETOROLAC TROMETHAMINE 15 MG/ML IJ SOLN
15.0000 mg | Freq: Once | INTRAMUSCULAR | Status: AC
  Administered 2020-02-10: 15 mg via INTRAVENOUS
  Filled 2020-02-10: qty 1

## 2020-02-10 MED ORDER — METHYLPREDNISOLONE SODIUM SUCC 125 MG IJ SOLR
60.0000 mg | Freq: Once | INTRAMUSCULAR | Status: AC
  Administered 2020-02-10: 60 mg via INTRAVENOUS
  Filled 2020-02-10: qty 2

## 2020-02-10 MED ORDER — MORPHINE SULFATE (PF) 4 MG/ML IV SOLN
4.0000 mg | Freq: Once | INTRAVENOUS | Status: AC
  Administered 2020-02-10: 4 mg via INTRAVENOUS
  Filled 2020-02-10: qty 1

## 2020-02-10 MED ORDER — HYDROMORPHONE HCL 1 MG/ML IJ SOLN
1.0000 mg | Freq: Once | INTRAMUSCULAR | Status: AC
  Administered 2020-02-10: 1 mg via INTRAVENOUS
  Filled 2020-02-10: qty 1

## 2020-02-10 MED ORDER — ONDANSETRON HCL 4 MG/2ML IJ SOLN
4.0000 mg | Freq: Once | INTRAMUSCULAR | Status: AC
  Administered 2020-02-10: 4 mg via INTRAVENOUS
  Filled 2020-02-10: qty 2

## 2020-02-10 MED ORDER — DIAZEPAM 5 MG/ML IJ SOLN
5.0000 mg | Freq: Once | INTRAMUSCULAR | Status: AC
  Administered 2020-02-10: 5 mg via INTRAVENOUS
  Filled 2020-02-10: qty 2

## 2020-02-10 NOTE — ED Triage Notes (Signed)
Patient arrived via EMS d/t right leg pain x 3 days per EMS. Patient has OTC pain patch that the cut and placed on different areas on leg. Per EMS per patient last time this occurred was when she had low potassium. Per EMS per patient, she states she has not been taking her potassium supplements in the past few months. Per EMS, she states that she hasn't been drinking any water in the past few days. While in transport, EMS stated that she vomited approx. 369ml yellow emesis. Patient stated after vomiting, she no longer feels nauseous per EMS.

## 2020-02-10 NOTE — ED Provider Notes (Signed)
[facility] DEPT Provider Note   CSN: [id number] Arrival date & time: 02/10/20  1940     History Chief Complaint  Patient presents with  . Leg Pain    [name] is a 69 y.o. female with a history of CKD, hypertension, type 2 diabetes, osteoarthritis, and fibromyalgia presenting for evaluation of acute right leg pain.  Reports right leg pain insidiously started about 2-3 days ago and has become intolerable.  Reports the pain starts in her low back and radiates to her anterior thigh and shin.  Crampy and sharp in nature.  Denies any weakness, however due to the pain hasn't been walking the same.  No associated numbness, does have some tingling on the thighs/shin (has had this occasionally already along with peripheral neuropathy). Has tenderness around her right hip that has chronically been there.  No position seems to make it better or worse. Denies any spinous process tenderness, bowel/bladder incontinence/retention, or saddle anesthesia.  She reports she had a similar episode several years ago due to hypokalemia, but was not as bad as this.  She has not been taking her potassium supplements for the past several months. No recent trauma or falls. No new exercise regimen. Has not started any new medications. No previous back surgeries.   Reports her diabetes is borderline controlled currently. Usually 200's in the morning, and highest 400's in the evenings.     Past Medical History:  Diagnosis Date  . Arthritis    L knee, hands, back   . Asthma   . Diabetes mellitus type 2, insulin dependent ([place])   . Fibromyalgia   . Full dentures   . GERD (gastroesophageal reflux disease)   . H/O hiatal hernia   . Headache    h/o migraines - was followed for a time with a wellness doctor  . History of esophageal dilatation   . History of rhabdomyolysis    03/ 2015  . Hyperlipidemia   . Hypertension   . Insulin pump in place    since 12/ 2014--   [employer]  . Osteoarthritis of left knee, primary localized 08/17/2014  . Renal insufficiency   . SUI (stress urinary incontinence, female)   . Wears glasses     Patient Active Problem List   Diagnosis Date Noted  . Syncope due to orthostatic hypotension 05/28/2019  . Hypertensive urgency 04/06/2019  . 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  . Wound infection 03/06/2019  . Localized swelling, mass and lump, multiple sites 03/27/2018  . Type II diabetes mellitus with renal manifestations ([place]) 12/27/2017  . HLD (hyperlipidemia) 12/27/2017  . HTN (hypertension) 12/27/2017  . CKD (chronic kidney disease), stage III 12/27/2017  . Syncope 12/27/2017  . Cough variant asthma 12/01/2015  . Osteoarthritis of left knee, primary localized 08/17/2014  . Knee osteoarthritis 08/17/2014  . Insulin pump in place 12/21/2013  . Rhabdomyolysis 11/15/2013  . Elevated lactic acid level 11/15/2013  . Hypoglycemia 11/15/2013  . Other and unspecified hyperlipidemia 08/06/2013  . Type II diabetes mellitus, uncontrolled ([place]) 07/20/2013  . Varicose veins of lower extremities with other complications 93/79/0240  . Fibromyalgia   . GERD 05/18/2010  . URINARY TRACT INFECTION, MONILIAL 05/05/2010  . UTI 05/02/2010  . ABDOMINAL PAIN, GENERALIZED 05/02/2010  . OBESITY 04/20/2010  . BURSITIS, LEFT SHOULDER 03/09/2010  . CANDIDIASIS OF VULVA AND VAGINA 01/10/2010  . Lipoma of arm s/p excision 01/29/2014 01/10/2010  . DEPRESSION 12/27/2009  . BACK PAIN WITH RADICULOPATHY 12/27/2009  . CYSTITIS, ACUTE 10/21/2009  .  MICROSCOPIC HEMATURIA 10/18/2009  . KNEE PAIN, BILATERAL 10/18/2009  . NEUROPATHY 09/08/2009  . HYPERTENSION, BENIGN ESSENTIAL 09/08/2009  . Asthma 09/08/2009  . STRESS INCONTINENCE 09/08/2009  . CHEST PAIN 07/19/2009  . ESOPHAGEAL STRICTURE 08/27/2005  . Diabetes mellitus without complication ([place]) 00/93/8182    Past Surgical History:  Procedure Laterality Date  . BLADDER SUSPENSION N/A 03/09/2014    Procedure: CYSTOSCOPY/SLING;  Surgeon: Reece Packer, MD;  Location: [facility];  Service: Urology;  Laterality: N/A;  . CARDIAC CATHETERIZATION  07-20-2009   DR Shelva Majestic   MODERATE LVH/  NORMAL  LVEF/  NORMAL CORONARY AND RENAL ARTERIES  . CARPAL TUNNEL RELEASE Right 2000  . CHOLECYSTECTOMY  1990  . COLONOSCOPY WITH ESOPHAGOGASTRODUODENOSCOPY (EGD)  06-18-2002  . ESOPHAGOGASTRODUODENOSCOPY (EGD) WITH ESOPHAGEAL DILATION  06-12-2010  . EXCISION LEFT UPPER ARM MASS  01-29-2014  . EYE SURGERY  2010   laser on R eye  . INTERSTIM IMPLANT REMOVAL N/A 03/06/2019   Procedure: REMOVAL OF INTERSTIM IMPLANT;  Surgeon: Bjorn Loser, MD;  Location: [facility];  Service: Urology;  Laterality: N/A;  . KNEE ARTHROSCOPY Right 1989  &  2002  . NEGATIVE SLEEP STUDY  2012   PER PT  . PARTIAL KNEE ARTHROPLASTY Left 08/17/2014   Procedure: LEFT UNICOMPARTMENTAL KNEE;  Surgeon: Johnny Bridge, MD;  Location: [facility];  Service: Orthopedics;  Laterality: Left;     OB History    Gravida  1   Para  1   Term  1   Preterm      AB      Living  1     SAB      TAB      Ectopic      Multiple      Live Births  1           Family History  Problem Relation Age of Onset  . Diabetes Mother   . Hypertension Mother   . Lung cancer Mother        smoked  . Diabetes Father   . Hypertension Father   . Lung cancer Father        smoked  . Diabetes Brother   . Heart failure Sister   . Diabetes Maternal Grandmother   . Cancer Maternal Grandmother     Social History   Tobacco Use  . Smoking status: Never Smoker  . Smokeless tobacco: Never Used  Vaping Use  . Vaping Use: Never used  Substance Use Topics  . Alcohol use: No  . Drug use: No    Home Medications Prior to Admission medications   Medication Sig Start Date End Date Taking? Authorizing Provider  albuterol (PROAIR HFA) 108 (90 Base) MCG/ACT inhaler 2 puffs every 4 hours as needed only  if your can't catch  your breath 01/20/16  Yes Tanda Rockers, MD  AMITIZA 8 MCG capsule TAKE 1 CAPSULE(8 MCG) BY MOUTH DAILY WITH BREAKFAST Patient taking differently: Take 8 mcg by mouth daily with breakfast.  10/23/19  Yes [name], FNP  amLODipine (NORVASC) 10 MG tablet TAKE 1 TABLET(10 MG) BY MOUTH AT BEDTIME Patient taking differently: Take 10 mg by mouth every evening.  12/17/19  Yes [name], FNP  dapagliflozin propanediol (FARXIGA) 5 MG TABS tablet Take 5 mg by mouth daily before breakfast. 12/31/19  Yes [name], FNP  docusate sodium (COLACE) 100 MG capsule Take 100 mg by mouth daily as needed for mild constipation.    Yes  [provider]  gabapentin (NEURONTIN) 300 MG capsule TAKE 1 CAPSULE(300 MG) BY MOUTH THREE TIMES DAILY Patient taking differently: Take 300 mg by mouth 3 (three) times daily.  12/28/19  Yes [name], FNP  HUMALOG 100 UNIT/ML injection INJECT 25 UNITS INTO SKIN WITH BREAFAST AND SUPPER AND 35 UNITS WITH LUNCH Patient taking differently: Inject 25-35 Units into the skin 3 (three) times daily with meals. Inject 35 units at breakfast, 25 units at lunch and 35 units at supper 02/02/20  Yes [name], FNP  mirabegron ER (MYRBETRIQ) 50 MG TB24 tablet Take 1 tablet (50 mg total) by mouth daily. 05/27/19  Yes [name], FNP  mometasone-formoterol (DULERA) 100-5 MCG/ACT AERO Inhale 2 puffs into the lungs as needed for wheezing.   Yes [provider]  Multiple Vitamin (MULTIVITAMIN WITH MINERALS) TABS tablet Take 1 tablet by mouth daily.   Yes [provider]  naproxen sodium (ALEVE) 220 MG tablet Take 220 mg by mouth 2 (two) times daily as needed (pain).   Yes [provider]  pantoprazole (PROTONIX) 40 MG tablet Take 30- 60 min before your first and last meals of the day Patient taking differently: Take 40 mg by mouth every evening.  12/01/15  Yes Tanda Rockers, MD  pravastatin (PRAVACHOL) 80 MG tablet Take 0.5 tablets (40 mg total) by mouth  daily. 06/15/19  Yes [name], MD  Semaglutide, 1 MG/DOSE, (OZEMPIC, 1 MG/DOSE,) 2 MG/1.5ML SOPN Inject 1 mg into the skin every Thursday. 06/18/19  Yes [name], MD  spironolactone (ALDACTONE) 50 MG tablet TAKE 1 TABLET(50 MG) BY MOUTH DAILY Patient taking differently: Take 50 mg by mouth daily.  12/17/19  Yes [name], FNP  Vitamin D, Ergocalciferol, (DRISDOL) 1.25 MG (50000 UNIT) CAPS capsule TAKE 1 CAPSULE BY MOUTH EVERY 7 DAYS ON [facility] Patient taking differently: Take 50,000 Units by mouth every 7 (seven) days. TAKE 1 CAPSULE BY MOUTH EVERY 7 DAYS ON [facility] 02/05/20  Yes [name], FNP  Baclofen 5 MG TABS Take 5 mg by mouth 3 (three) times daily as needed. Can increase to two tablets (10mg ) if tolerate 5 mg. 02/11/20   Patriciaann Clan, DO  Continuous Blood Gluc Receiver (FREESTYLE LIBRE READER) DEVI by Does not apply route. CGM device-[employer], Historical, MD  Continuous Blood Gluc Sensor (FREESTYLE LIBRE 14 DAY SENSOR) MISC by Does not apply route. CGM 14-day sensors; send refills to [employer] (in [place])    [provider]  HYDROcodone-acetaminophen (NORCO) 7.5-325 MG tablet Take 1 tablet by mouth every 6 (six) hours as needed for up to 3 days for moderate pain. 02/11/20 02/14/20  Patriciaann Clan, DO  Lancets Misc. MISC 1 each by Does not apply route 4 (four) times daily -  with meals and at bedtime. 12/16/18   [name], FNP  losartan (COZAAR) 50 MG tablet Take 1 tablet (50 mg total) by mouth daily. Patient not taking: Reported on 01/15/2020 11/11/15   Elayne Snare, MD  methocarbamol (ROBAXIN) 750 MG tablet Take 750 mg by mouth at bedtime as needed. Patient not taking: Reported on 02/10/2020 10/28/19   [provider]  Nebivolol HCl (BYSTOLIC) 20 MG TABS Take 1 tablet (20 mg total) by mouth daily. Patient not taking: Reported on 01/15/2020 11/20/18   [name], FNP    Allergies    Pollen extract, Tomato, and  Codeine  Review of Systems   Review of Systems  Constitutional: Negative for chills, fatigue  and fever.  Respiratory: Negative for shortness of breath.   Cardiovascular: Negative for chest pain and leg swelling.  Genitourinary: Negative for dysuria and frequency.  Musculoskeletal: Positive for myalgias.  Skin: Negative for rash and wound.  Neurological: Negative for weakness.   Physical Exam Updated Vital Signs BP 118/68   Pulse 72   Temp 97.9 F (36.6 C)   Resp 13   Ht 5\' 4"  (1.626 m)   Wt 71.2 kg   LMP  (LMP Unknown)   SpO2 98%   BMI 26.95 kg/m   Physical Exam Constitutional:      Comments: She appears uncomfortable due to pain.   HENT:     Head: Normocephalic and atraumatic.     Mouth/Throat:     Mouth: Mucous membranes are moist.  Eyes:     Extraocular Movements: Extraocular movements intact.  Cardiovascular:     Rate and Rhythm: Normal rate and regular rhythm.     Pulses: Normal pulses.  Pulmonary:     Effort: Pulmonary effort is normal.  Abdominal:     Palpations: Abdomen is soft.  Musculoskeletal:        General: No deformity or signs of injury.     Right lower leg: No edema.     Left lower leg: No edema.     Comments: No deformities or rashes noted to lumbar spine or right lower extremity. Non-tender with palpation of thoracic and lumbar spinous processes. Sensation to light touch intact and equal bilaterally to BLE. Limited ROM with lumbar left rotation and hip flexion due to pain, otherwise full ROM. 5/5 BLE strength through hip, knee, and ankle joints.  2/4 patellar flexes bilaterally.  Straight leg raise reproduces symptoms on right leg through anterior/lateral thigh and anterior shin.   Skin:    General: Skin is warm and dry.     Capillary Refill: Capillary refill takes less than 2 seconds. Easily palpable dorsalis pedis and posterior tibialis pulses bilaterally.    Findings: No rash.     Comments: Vertical surgical scar present on L knee.    Neurological:     General: No focal deficit present.     Mental Status: She is oriented to person, place, and time.  Psychiatric:        Mood and Affect: Mood normal.        Behavior: Behavior normal.     ED Results / Procedures / Treatments   Labs (all labs ordered are listed, but only abnormal results are displayed) Labs Reviewed  CBC WITH DIFFERENTIAL/PLATELET - Abnormal; Notable for the following components:      Result Value   RBC 5.14 (*)    Hemoglobin 15.5 (*)    HCT 47.4 (*)    All other components within normal limits  BASIC METABOLIC PANEL - Abnormal; Notable for the following components:   Chloride 96 (*)    Glucose, Bld 240 (*)    Creatinine, Ser 1.20 (*)    GFR calc non Af Amer 47 (*)    GFR calc Af Amer 54 (*)    All other components within normal limits  I-STAT CHEM 8, ED - Abnormal; Notable for the following components:   Chloride 97 (*)    Creatinine, Ser 1.30 (*)    Glucose, Bld 244 (*)    Hemoglobin 16.3 (*)    HCT 48.0 (*)    All other components within normal limits  CK  MAGNESIUM    EKG None  Radiology CT Lumbar Spine  Wo Contrast  Result Date: 02/10/2020 CLINICAL DATA:  Continued low back pain EXAM: CT LUMBAR SPINE WITHOUT CONTRAST TECHNIQUE: Multidetector CT imaging of the lumbar spine was performed without intravenous contrast administration. Multiplanar CT image reconstructions were also generated. COMPARISON:  CT cervical spine 10/01/2018, CT chest 11/16/2015, lumbar radiographs 10/01/2018 FINDINGS: Segmentation: 5 lumbar type vertebral bodies with the lowest fully formed disc space denoted as L5-S1 confirmed by comparison imaging of the cervical spine and chest. Unfused L1 transverse processes, normal congenital variant. Alignment: Preservation of the normal lumbar lordosis. There is mild gradual dextrocurvature of the lumbar levels which is likely on a positional basis given absence of this finding on comparison radiographs. Slight asymmetric  widening of the right L4-5 articular facet is favored to be degenerative given the presence of vacuum phenomenon and degenerative fragmentation at this level. Vertebrae: No acute vertebral body fracture or height loss is evident. The posterior elements are intact. Some degenerative osteophyte formations noted about the posterior elements and articular facets, maximally at L4-5. Discogenic changes nodes with vacuum disc and Schmorl's node formations are maximal L5-S1. Paraspinal and other soft tissues: No paravertebral fluid, swelling, gas or hemorrhage. Limited evaluation of the posterior abdomen and pelvis reveal aortoiliac atherosclerosis without aneurysm or ectasia. Patient appears to be post cholecystectomy. Few noninflamed colonic diverticula are present as well. Disc levels: Level by level evaluation of the lumbar spine below: T11-T12: Moderate disc height loss and vacuum disc phenomenon and minimal discogenic endplate changes. At most mild facet arthrosis, left greater than right. No significant canal stenosis or foraminal narrowing. T12-L1: Minimal disc height loss and mild facet degenerative changes. No significant posterior disc abnormality. No significant spinal canal or foraminal stenosis. L1-L2: Minimal disc height loss and mild facet degenerative change. No significant posterior disc abnormality. No significant spinal canal or foraminal stenosis. L2-L3: Minimal disc height loss and mild facet degenerative change. Tiny left foraminal zone protrusion. No significant canal stenosis or resulting foraminal stenosis. L3-L4: Mild disc height loss and mild facet degenerative change. Shallow global disc bulge. No significant canal stenosis or foraminal narrowing. L4-L5: Moderate disc height loss with global disc bulge with moderate left and more severe right facet arthropathy. Combination of the disc bulge in synovial thickening results in some mild canal stenosis, partial effacement of the lateral recesses  with contact of the traversing L5 nerve roots, and moderate bilateral foraminal narrowing. L5-S1: Severe disc height loss with discogenic changes and moderate bilateral foraminal narrowing. Broad-based posterior disc bulge and some synovial thickening result in mild canal stenosis as well as some minimal contact of the traversing S1 nerve roots and moderate bilateral foraminal narrowing. IMPRESSION: 1. No acute vertebral body fracture or traumatic listhesis is evident. 2. Slight asymmetric widening of the right L4-5 articular facet is favored to be degenerative given the presence of vacuum phenomenon and degenerative fragmentation at this level. 3. Multilevel degenerative changes of the lumbar spine, maximal at L4-5 and L5-S1. Detailed level by level above. 4. Aortic Atherosclerosis (ICD10-I70.0). Electronically Signed   By: Lovena Le M.D.   On: 02/10/2020 22:05   DG Hip Unilat W or Wo Pelvis 2-3 Views Right  Result Date: 02/10/2020 CLINICAL DATA:  Right-sided hip pain EXAM: DG HIP (WITH OR WITHOUT PELVIS) 2-3V RIGHT COMPARISON:  None. FINDINGS: There is no evidence of hip fracture or dislocation. There is no evidence of arthropathy or other focal bone abnormality. IMPRESSION: Negative. Electronically Signed   By: Donavan Foil M.D.   On: 02/10/2020 21:42  Procedures Procedures (including critical care time)  Medications Ordered in ED Medications  morphine 4 MG/ML injection 4 mg (4 mg Intravenous Given 02/10/20 2119)  ondansetron (ZOFRAN) injection 4 mg (4 mg Intravenous Given 02/10/20 2118)  diazepam (VALIUM) injection 5 mg (5 mg Intravenous Given 02/10/20 2119)  ketorolac (TORADOL) 15 MG/ML injection 15 mg (15 mg Intravenous Given 02/10/20 2308)  HYDROmorphone (DILAUDID) injection 1 mg (1 mg Intravenous Given 02/10/20 2308)  methylPREDNISolone sodium succinate (SOLU-MEDROL) 125 mg/2 mL injection 60 mg (60 mg Intravenous Given 02/10/20 2308)  HYDROmorphone (DILAUDID) injection 0.5 mg (0.5 mg  Intravenous Given 02/11/20 0041)  ondansetron (ZOFRAN-ODT) disintegrating tablet 8 mg (8 mg Oral Given 02/11/20 0148)    ED Course  I have reviewed the triage vital signs and the nursing notes.  Pertinent labs & imaging results that were available during my care of the patient were reviewed by me and considered in my medical decision making (see chart for details).  Clinical Course as of Feb 11 1056  Wed Feb 10, 2020  2257 CT showing moderate to severe disc loss around L4-S1 with likely bulging discs.  Reports continued leg pain. Will trial toradol and Dilaudid and reassess.   [SB]    Clinical Course User Index [SB] Patriciaann Clan, DO   MDM Rules/Calculators/A&P                          69 year old female with a history of osteoarthritis, fibromyalgia, and type 2 diabetes presenting for evaluation of low back pain with associated right leg pain.  Atypical pain distribution does not clearly fit dermatomal regions, however loosely appears around L4-S1.  Hemodynamically stable.  While she appears uncomfortable on exam, reassuringly is neurovascularly intact in her bilateral lower extremity without any sensory or motor deficits.  Straight leg raise on the right does worsen her symptoms.  Obtained BMP and CBC to rule out electrolyte or hematological abnormality as this is caused similar issues for her in the past.  K and magnesium WNL.  CK WNL. No anemia. Cr around baseline.  Hip/pelvis x-rays unremarkable.  CT lumbar spine showing moderate-severe disc space loss around L4-L5 and L5-S1 with disc bulging and foraminal narrowing. No vertebral fractures or spondylolisthesis.  Radiculopathy without significant red flags likely etiology of pain.  Doubt critical limb ischemia with easily palpable distal pulses and warm extremities.  Provided IV pain relief, muscle relaxant, and Solu-Medrol with improvement of the pain prior to dc.  Sent in short supply of Norco and baclofen for muscle spasms.  PMP  aware reviewed and appropriate. Opted against prednisone course given marginal benefit and heightened risk in the setting of her currently uncontrolled diabetes (despite recent A1c at goal). Recommend follow-up with PCP in the next 1-2 days.  Additionally provided neurosurgery referral if symptoms not improving or worsening.  Return cautions discussed including development of any weakness, saddle anesthesia, persistent numbness, intolerable pain despite medication, or any bowel/bladder difficulties.  Final Clinical Impression(s) / ED Diagnoses Final diagnoses:  Radiculopathy due to lumbar intervertebral disc disorder    Rx / DC Orders ED Discharge Orders         Ordered    Baclofen 5 MG TABS  3 times daily PRN     Discontinue  Reprint     02/11/20 0014    HYDROcodone-acetaminophen (NORCO) 7.5-325 MG tablet  Every 6 hours PRN     Discontinue  Reprint     02/11/20 0014  Patriciaann Clan, DO 02/11/20 1123    Drenda Freeze, MD 02/11/20 2325

## 2020-02-11 ENCOUNTER — Ambulatory Visit: Payer: Self-pay

## 2020-02-11 DIAGNOSIS — E1165 Type 2 diabetes mellitus with hyperglycemia: Secondary | ICD-10-CM

## 2020-02-11 DIAGNOSIS — M5116 Intervertebral disc disorders with radiculopathy, lumbar region: Secondary | ICD-10-CM | POA: Diagnosis not present

## 2020-02-11 DIAGNOSIS — I10 Essential (primary) hypertension: Secondary | ICD-10-CM

## 2020-02-11 MED ORDER — HYDROMORPHONE HCL 1 MG/ML IJ SOLN
0.5000 mg | Freq: Once | INTRAMUSCULAR | Status: AC
  Administered 2020-02-11: 0.5 mg via INTRAVENOUS
  Filled 2020-02-11: qty 1

## 2020-02-11 MED ORDER — ONDANSETRON 8 MG PO TBDP
8.0000 mg | ORAL_TABLET | Freq: Once | ORAL | Status: AC
  Administered 2020-02-11: 8 mg via ORAL
  Filled 2020-02-11: qty 1

## 2020-02-11 MED ORDER — HYDROCODONE-ACETAMINOPHEN 7.5-325 MG PO TABS: 1.0000 | ORAL_TABLET | Freq: Four times a day (QID) | ORAL | 0 refills | Status: AC | PRN

## 2020-02-11 MED ORDER — BACLOFEN 5 MG PO TABS: 5.0000 mg | ORAL_TABLET | Freq: Three times a day (TID) | ORAL | 0 refills | Status: DC | PRN

## 2020-02-11 NOTE — Discharge Instructions (Signed)
It was wonderful to meet you tonight, however apologize that you are in so much pain in your right leg and back.  We did a CT of your back which was showing many degenerative changes and disc disease, believe you do have a bulging disc causing the nerve pain down your leg.  You can see the image results through your MyChart account.  You received IV pain meds and steroids while you were here.  I have also sent in higher strength pain medication and a muscle relaxer (baclofen) to help with spasms.  You can continue to use Tylenol and ibuprofen, be mindful to limit your Tylenol to 4000 mg total daily and add up with Tylenol also in the Norco sent in.  Lidocaine patches or Voltaren gel on your back and leg can also help.  Ice/heat as needed as well.  If you have any leg weakness, worsening numbness/tingling, numbness of your groin, any new bowel/bladder troubles, or intolerable pain not helped by pain medication at home please seek repeat evaluation.

## 2020-02-11 NOTE — Chronic Care Management (AMB) (Signed)
  Care Management    Consult Note  02/11/2020 Name: [name] MRN: [id number] DOB: 09/08/1950  Care management team received notification of patient's recent emergency department visit related to Radiculopathy due to lumbar intervertebral disc disorder.Based on review of health record, [name] is currently active in the embedded care coordination program. Unsuccessful outbound call placed to the patient to assist with care coordination needs.   Review of patient status, including review of consultants reports, relevant laboratory and other test results, and collaboration with appropriate care team members and the patient's provider was performed as part of comprehensive patient evaluation and provision of chronic care management services.    Plan: HIPAA compliant voice message left requesting a return call. Chart reviewed to note next PharmD appointment scheduled for 6/22. Next appointment with RN Care Manager scheduled for 7/20. SW collaborated with embedded care team regarding patient recent ED visit. No SW follow up planned at this time.  Daneen Schick, BSW, CDP Social Worker, Certified Dementia Practitioner [place] [facility] [place] [facility] [phone]

## 2020-02-11 NOTE — ED Notes (Signed)
Right after getting patient ready for d/c, she vomits 173ml green emesis. Notified Dr. Randal Buba. One-time order for 8mg  zofran ODT ordered and given. Okay to d/c after given medication per Dr. Randal Buba.

## 2020-02-12 ENCOUNTER — Other Ambulatory Visit: Payer: Self-pay | Admitting: Family Medicine

## 2020-02-13 ENCOUNTER — Encounter (HOSPITAL_COMMUNITY): Payer: Self-pay | Admitting: *Deleted

## 2020-02-13 ENCOUNTER — Emergency Department (HOSPITAL_COMMUNITY): Payer: Medicare Other

## 2020-02-13 ENCOUNTER — Emergency Department (HOSPITAL_COMMUNITY)
Admission: EM | Admit: 2020-02-13 | Discharge: 2020-02-13 | Disposition: A | Discharge status: Home / Self Care | Payer: Medicare Other | Attending: Emergency Medicine | Admitting: Emergency Medicine

## 2020-02-13 ENCOUNTER — Other Ambulatory Visit: Payer: Self-pay | Admitting: Internal Medicine

## 2020-02-13 DIAGNOSIS — Z91048 Other nonmedicinal substance allergy status: Secondary | ICD-10-CM | POA: Diagnosis not present

## 2020-02-13 DIAGNOSIS — M5116 Intervertebral disc disorders with radiculopathy, lumbar region: Secondary | ICD-10-CM | POA: Diagnosis not present

## 2020-02-13 DIAGNOSIS — Z7984 Long term (current) use of oral hypoglycemic drugs: Secondary | ICD-10-CM | POA: Diagnosis not present

## 2020-02-13 DIAGNOSIS — M5410 Radiculopathy, site unspecified: Secondary | ICD-10-CM

## 2020-02-13 DIAGNOSIS — I129 Hypertensive chronic kidney disease with stage 1 through stage 4 chronic kidney disease, or unspecified chronic kidney disease: Secondary | ICD-10-CM | POA: Diagnosis not present

## 2020-02-13 DIAGNOSIS — E1122 Type 2 diabetes mellitus with diabetic chronic kidney disease: Secondary | ICD-10-CM | POA: Insufficient documentation

## 2020-02-13 DIAGNOSIS — Z91018 Allergy to other foods: Secondary | ICD-10-CM | POA: Diagnosis not present

## 2020-02-13 DIAGNOSIS — Z885 Allergy status to narcotic agent status: Secondary | ICD-10-CM | POA: Diagnosis not present

## 2020-02-13 DIAGNOSIS — M79604 Pain in right leg: Secondary | ICD-10-CM | POA: Insufficient documentation

## 2020-02-13 DIAGNOSIS — N183 Chronic kidney disease, stage 3 unspecified: Secondary | ICD-10-CM | POA: Diagnosis not present

## 2020-02-13 DIAGNOSIS — M5126 Other intervertebral disc displacement, lumbar region: Secondary | ICD-10-CM | POA: Diagnosis not present

## 2020-02-13 DIAGNOSIS — J45909 Unspecified asthma, uncomplicated: Secondary | ICD-10-CM | POA: Diagnosis not present

## 2020-02-13 DIAGNOSIS — Z79899 Other long term (current) drug therapy: Secondary | ICD-10-CM | POA: Diagnosis not present

## 2020-02-13 DIAGNOSIS — R6889 Other general symptoms and signs: Secondary | ICD-10-CM | POA: Diagnosis not present

## 2020-02-13 DIAGNOSIS — M5127 Other intervertebral disc displacement, lumbosacral region: Secondary | ICD-10-CM | POA: Diagnosis not present

## 2020-02-13 DIAGNOSIS — M545 Low back pain: Secondary | ICD-10-CM | POA: Diagnosis not present

## 2020-02-13 DIAGNOSIS — I499 Cardiac arrhythmia, unspecified: Secondary | ICD-10-CM | POA: Diagnosis not present

## 2020-02-13 DIAGNOSIS — Z743 Need for continuous supervision: Secondary | ICD-10-CM | POA: Diagnosis not present

## 2020-02-13 DIAGNOSIS — M48061 Spinal stenosis, lumbar region without neurogenic claudication: Secondary | ICD-10-CM | POA: Diagnosis not present

## 2020-02-13 MED ORDER — DEXAMETHASONE SODIUM PHOSPHATE 10 MG/ML IJ SOLN
10.0000 mg | Freq: Once | INTRAMUSCULAR | Status: AC
  Administered 2020-02-13: 10 mg via INTRAVENOUS
  Filled 2020-02-13: qty 1

## 2020-02-13 MED ORDER — HYDROMORPHONE HCL 1 MG/ML IJ SOLN
1.0000 mg | Freq: Once | INTRAMUSCULAR | Status: AC
  Administered 2020-02-13: 1 mg via INTRAVENOUS
  Filled 2020-02-13: qty 1

## 2020-02-13 MED ORDER — PREDNISONE 20 MG PO TABS
ORAL_TABLET | ORAL | 0 refills | Status: DC
Start: 2020-02-14 — End: 2020-05-03

## 2020-02-13 MED ORDER — OXYCODONE-ACETAMINOPHEN 5-325 MG PO TABS
1.0000 | ORAL_TABLET | Freq: Once | ORAL | Status: AC
  Administered 2020-02-13: 1 via ORAL
  Filled 2020-02-13: qty 1

## 2020-02-13 NOTE — Discharge Instructions (Addendum)
It was our pleasure to provide your ER care today - we hope that you feel better.  Take prednisone as prescribed. Please note that steroid medication can increase your blood sugar - be sure to monitor your sugars closely, drink plenty of water/fluids, follow diabetic eating plan, and take your diabetes medication.   Try to find position of comfort - for example, lying on back with pillow(s) behind knees, or on your side with pillow between knees. Avoid bending at waist, or heavy lifting > 10 lbs.  Your MRI was ready as showing :IMPRESSION:  1. New L4-5 disc extrusion with right L4 nerve root impingement in the lateral recess.   Follow up with back/spine specialist in the coming week - call office Monday for appointment.   Return to ER if worse, leg numbness/weakness, intractable pain, or other concern.

## 2020-02-13 NOTE — ED Triage Notes (Signed)
She c/o non-traumatic right leg pain, mainly at lateral thigh area. She was seen here recently for same and underwent extensive diagnostic testing. She is tearful, as if in much pain. She denies fever, nor any other sign of current illness.

## 2020-02-13 NOTE — ED Notes (Signed)
She is eating a sandwich per her request to "help keep the Percocet down". Her daughter, who is driving, is with her.

## 2020-02-13 NOTE — ED Provider Notes (Signed)
[facility] DEPT Provider Note   CSN: [id number] Arrival date & time: 02/13/20  0932   History Chief Complaint  Patient presents with  . Leg Pain    [name] is a 69 y.o. female with history of DDD of the lumbar spine, insulin dependent DM, CKD, HTN who presents with R leg pain. Pt was seen in the ED 3 days ago for the same. She had a xray of the hip and CT of the lumbar spine which showed multilevel degenerative changes. She was discharged with Norco and baclofen. Yesterday she was using her walker and her leg gave out and she fell on to her buttocks. The pain shoots from the lumbar spine to the lateral thigh to posterior calf and foot. The pain is worse with walking but is also constant and she cannot get comfortable. She has chronic urinary incontinence and no new fecal incontinence or urinary retention. The pain feels like an intense pressure on her leg and it feels weak like Jello. She also has been taking Gabapentin three times daily for neuropathy.  HPI     Past Medical History:  Diagnosis Date  . Arthritis    L knee, hands, back   . Asthma   . Diabetes mellitus type 2, insulin dependent ([place])   . Fibromyalgia   . Full dentures   . GERD (gastroesophageal reflux disease)   . H/O hiatal hernia   . Headache    h/o migraines - was followed for a time with a wellness doctor  . History of esophageal dilatation   . History of rhabdomyolysis    03/ 2015  . Hyperlipidemia   . Hypertension   . Insulin pump in place    since 12/ 2014--  [employer]  . Osteoarthritis of left knee, primary localized 08/17/2014  . Renal insufficiency   . SUI (stress urinary incontinence, female)   . Wears glasses     Patient Active Problem List   Diagnosis Date Noted  . Syncope due to orthostatic hypotension 05/28/2019  . Hypertensive urgency 04/06/2019  . 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  . Wound infection 03/06/2019  . Localized swelling, mass and lump,  multiple sites 03/27/2018  . Type II diabetes mellitus with renal manifestations ([place]) 12/27/2017  . HLD (hyperlipidemia) 12/27/2017  . HTN (hypertension) 12/27/2017  . CKD (chronic kidney disease), stage III 12/27/2017  . Syncope 12/27/2017  . Cough variant asthma 12/01/2015  . Osteoarthritis of left knee, primary localized 08/17/2014  . Knee osteoarthritis 08/17/2014  . Insulin pump in place 12/21/2013  . Rhabdomyolysis 11/15/2013  . Elevated lactic acid level 11/15/2013  . Hypoglycemia 11/15/2013  . Other and unspecified hyperlipidemia 08/06/2013  . Type II diabetes mellitus, uncontrolled ([place]) 07/20/2013  . Varicose veins of lower extremities with other complications 35/57/3220  . Fibromyalgia   . GERD 05/18/2010  . URINARY TRACT INFECTION, MONILIAL 05/05/2010  . UTI 05/02/2010  . ABDOMINAL PAIN, GENERALIZED 05/02/2010  . OBESITY 04/20/2010  . BURSITIS, LEFT SHOULDER 03/09/2010  . CANDIDIASIS OF VULVA AND VAGINA 01/10/2010  . Lipoma of arm s/p excision 01/29/2014 01/10/2010  . DEPRESSION 12/27/2009  . BACK PAIN WITH RADICULOPATHY 12/27/2009  . CYSTITIS, ACUTE 10/21/2009  . MICROSCOPIC HEMATURIA 10/18/2009  . KNEE PAIN, BILATERAL 10/18/2009  . NEUROPATHY 09/08/2009  . HYPERTENSION, BENIGN ESSENTIAL 09/08/2009  . Asthma 09/08/2009  . STRESS INCONTINENCE 09/08/2009  . CHEST PAIN 07/19/2009  . ESOPHAGEAL STRICTURE 08/27/2005  . Diabetes mellitus without complication ([place]) 25/42/7062    Past Surgical  History:  Procedure Laterality Date  . BLADDER SUSPENSION N/A 03/09/2014   Procedure: CYSTOSCOPY/SLING;  Surgeon: Reece Packer, MD;  Location: [facility];  Service: Urology;  Laterality: N/A;  . CARDIAC CATHETERIZATION  07-20-2009   DR Shelva Majestic   MODERATE LVH/  NORMAL  LVEF/  NORMAL CORONARY AND RENAL ARTERIES  . CARPAL TUNNEL RELEASE Right 2000  . CHOLECYSTECTOMY  1990  . COLONOSCOPY WITH ESOPHAGOGASTRODUODENOSCOPY (EGD)  06-18-2002  .  ESOPHAGOGASTRODUODENOSCOPY (EGD) WITH ESOPHAGEAL DILATION  06-12-2010  . EXCISION LEFT UPPER ARM MASS  01-29-2014  . EYE SURGERY  2010   laser on R eye  . INTERSTIM IMPLANT REMOVAL N/A 03/06/2019   Procedure: REMOVAL OF INTERSTIM IMPLANT;  Surgeon: Bjorn Loser, MD;  Location: [facility];  Service: Urology;  Laterality: N/A;  . KNEE ARTHROSCOPY Right 1989  &  2002  . NEGATIVE SLEEP STUDY  2012   PER PT  . PARTIAL KNEE ARTHROPLASTY Left 08/17/2014   Procedure: LEFT UNICOMPARTMENTAL KNEE;  Surgeon: Johnny Bridge, MD;  Location: [facility];  Service: Orthopedics;  Laterality: Left;     OB History    Gravida  1   Para  1   Term  1   Preterm      AB      Living  1     SAB      TAB      Ectopic      Multiple      Live Births  1           Family History  Problem Relation Age of Onset  . Diabetes Mother   . Hypertension Mother   . Lung cancer Mother        smoked  . Diabetes Father   . Hypertension Father   . Lung cancer Father        smoked  . Diabetes Brother   . Heart failure Sister   . Diabetes Maternal Grandmother   . Cancer Maternal Grandmother     Social History   Tobacco Use  . Smoking status: Never Smoker  . Smokeless tobacco: Never Used  Vaping Use  . Vaping Use: Never used  Substance Use Topics  . Alcohol use: No  . Drug use: No    Home Medications Prior to Admission medications   Medication Sig Start Date End Date Taking? Authorizing Provider  Baclofen 5 MG TABS TAKE 1 TABLET(5MG  TOTAL) BY MOUTH THREE TIMES DAILY AS NEEDED** CAN INCREASE TO TAKE 2 TABLETS(10MG  TOTAL) IF TOLERATE 5MG  02/12/20   [name], MD  albuterol [facility] HFA) 108 (90 Base) MCG/ACT inhaler 2 puffs every 4 hours as needed only  if your can't catch your breath 01/20/16   Tanda Rockers, MD  AMITIZA 8 MCG capsule TAKE 1 CAPSULE(8 MCG) BY MOUTH DAILY WITH BREAKFAST Patient taking differently: Take 8 mcg by mouth daily with breakfast.  10/23/19   [name], FNP   amLODipine (NORVASC) 10 MG tablet TAKE 1 TABLET(10 MG) BY MOUTH AT BEDTIME Patient taking differently: Take 10 mg by mouth every evening.  12/17/19   [name], FNP  Continuous Blood Gluc Receiver (FREESTYLE LIBRE READER) DEVI by Does not apply route. CGM device-[employer], Historical, MD  Continuous Blood Gluc Sensor (FREESTYLE LIBRE 14 DAY SENSOR) MISC by Does not apply route. CGM 14-day sensors; send refills to [employer] (in [place])    [provider]  dapagliflozin propanediol (FARXIGA) 5 MG TABS tablet  Take 5 mg by mouth daily before breakfast. 12/31/19   [name], FNP  docusate sodium (COLACE) 100 MG capsule Take 100 mg by mouth daily as needed for mild constipation.     [provider]  gabapentin (NEURONTIN) 300 MG capsule TAKE 1 CAPSULE(300 MG) BY MOUTH THREE TIMES DAILY Patient taking differently: Take 300 mg by mouth 3 (three) times daily.  12/28/19   [name], FNP  HUMALOG 100 UNIT/ML injection INJECT 25 UNITS INTO SKIN WITH BREAFAST AND SUPPER AND 35 UNITS WITH LUNCH Patient taking differently: Inject 25-35 Units into the skin 3 (three) times daily with meals. Inject 35 units at breakfast, 25 units at lunch and 35 units at supper 02/02/20   [name], FNP  HYDROcodone-acetaminophen (NORCO) 7.5-325 MG tablet Take 1 tablet by mouth every 6 (six) hours as needed for up to 3 days for moderate pain. 02/11/20 02/14/20  Patriciaann Clan, DO  Lancets Misc. MISC 1 each by Does not apply route 4 (four) times daily -  with meals and at bedtime. 12/16/18   [name], FNP  losartan (COZAAR) 50 MG tablet Take 1 tablet (50 mg total) by mouth daily. Patient not taking: Reported on 01/15/2020 11/11/15   Elayne Snare, MD  methocarbamol (ROBAXIN) 750 MG tablet Take 750 mg by mouth at bedtime as needed. Patient not taking: Reported on 02/10/2020 10/28/19   [provider]  mirabegron ER (MYRBETRIQ) 50 MG TB24 tablet Take 1 tablet (50 mg total) by  mouth daily. 05/27/19   [name], FNP  mometasone-formoterol (DULERA) 100-5 MCG/ACT AERO Inhale 2 puffs into the lungs as needed for wheezing.    [provider]  Multiple Vitamin (MULTIVITAMIN WITH MINERALS) TABS tablet Take 1 tablet by mouth daily.    [provider]  naproxen sodium (ALEVE) 220 MG tablet Take 220 mg by mouth 2 (two) times daily as needed (pain).    [provider]  Nebivolol HCl (BYSTOLIC) 20 MG TABS Take 1 tablet (20 mg total) by mouth daily. Patient not taking: Reported on 01/15/2020 11/20/18   [name], FNP  pantoprazole (PROTONIX) 40 MG tablet Take 30- 60 min before your first and last meals of the day Patient taking differently: Take 40 mg by mouth every evening.  12/01/15   Tanda Rockers, MD  pravastatin (PRAVACHOL) 80 MG tablet Take 0.5 tablets (40 mg total) by mouth daily. 06/15/19   [name], MD  Semaglutide, 1 MG/DOSE, (OZEMPIC, 1 MG/DOSE,) 2 MG/1.5ML SOPN Inject 1 mg into the skin every Thursday. 06/18/19   [name], MD  spironolactone (ALDACTONE) 50 MG tablet TAKE 1 TABLET(50 MG) BY MOUTH DAILY Patient taking differently: Take 50 mg by mouth daily.  12/17/19   [name], FNP  Vitamin D, Ergocalciferol, (DRISDOL) 1.25 MG (50000 UNIT) CAPS capsule TAKE 1 CAPSULE BY MOUTH EVERY 7 DAYS ON [facility] Patient taking differently: Take 50,000 Units by mouth every 7 (seven) days. TAKE 1 CAPSULE BY MOUTH EVERY 7 DAYS ON [facility] 02/05/20   [name], FNP    Allergies    Pollen extract, Tomato, and Codeine  Review of Systems   Review of Systems  Constitutional: Negative for fever.  Respiratory: Negative for shortness of breath.   Cardiovascular: Negative for chest pain.  Gastrointestinal: Negative for abdominal pain.  Musculoskeletal: Positive for arthralgias and back pain.    Physical Exam Updated Vital Signs BP (!) 184/99 (BP Location: Left Arm)   Pulse (!) 101   Temp 98.1 F (36.7  C)   Resp 19   LMP  (LMP  Unknown)   SpO2 97%   Physical Exam Vitals and nursing note reviewed.  Constitutional:      General: She is not in acute distress.    Appearance: Normal appearance. She is well-developed. She is not ill-appearing.     Comments: Uncomfortable appearing due to pain  HENT:     Head: Normocephalic and atraumatic.  Eyes:     General: No scleral icterus.       Right eye: No discharge.        Left eye: No discharge.     Conjunctiva/sclera: Conjunctivae normal.     Pupils: Pupils are equal, round, and reactive to light.  Cardiovascular:     Rate and Rhythm: Normal rate.     Comments: Intact distal pulses Pulmonary:     Effort: Pulmonary effort is normal. No respiratory distress.  Abdominal:     General: There is no distension.  Musculoskeletal:     Cervical back: Normal range of motion.     Comments: Back: Inspection: No masses, deformity, or rash Palpation: Lower lumbar spinal tenderness and tenderness over the R buttocks Strength: 5/5 in left lower extremity. 4/5 in RLE Sensation: Intact sensation with light touch in lower extremities bilaterally SLR: Positive seated straight leg raise on R side Gait: Not tested   Skin:    General: Skin is warm and dry.  Neurological:     Mental Status: She is alert and oriented to person, place, and time.  Psychiatric:        Behavior: Behavior normal.     ED Results / Procedures / Treatments   Labs (all labs ordered are listed, but only abnormal results are displayed) Labs Reviewed - No data to display  EKG None  Radiology DG Lumbar Spine Complete  Result Date: 02/13/2020 CLINICAL DATA:  69 year old female with back pain after a fall EXAM: LUMBAR SPINE - COMPLETE 4+ VIEW COMPARISON:  10/01/2018 FINDINGS: Lumbar Spine: Lumbar vertebral elements maintain normal alignment without evidence of anterolisthesis, retrolisthesis, subluxation. No acute fracture line identified. Vertebral body heights maintained. Mild disc space narrowing with  endplate sclerosis at W2-N5 similar to the prior. Early facet disease of L4-L5 and L5-S1. Oblique images demonstrate no displaced pars defect. Unremarkable appearance of the visualized abdomen. IMPRESSION: Negative for acute fracture or malalignment of the lumbar spine. Electronically Signed   By: Corrie Mckusick D.O.   On: 02/13/2020 14:26   MR LUMBAR SPINE WO CONTRAST  Result Date: 02/13/2020 CLINICAL DATA:  Lumbar radiculopathy. Nontraumatic right leg pain, predominantly at the lateral aspect of the thigh, with right leg weakness for 4 days. EXAM: MRI LUMBAR SPINE WITHOUT CONTRAST TECHNIQUE: Multiplanar, multisequence MR imaging of the lumbar spine was performed. No intravenous contrast was administered. COMPARISON:  Lumbar spine radiographs 02/13/2020. Lumbar spine CT 02/10/2020. Lumbar spine MRI 12/19/2013. FINDINGS: Segmentation:  5 lumbar type vertebrae.  Rudimentary S1-2 disc. Alignment:  Borderline anterolisthesis of L4 on L5. Vertebrae: No fracture, significant marrow edema, or evidence of discitis. 9 mm STIR hyperintense lesion in the right L3 pedicle, unchanged from 2015 and therefore likely benign. Mild chronic degenerative endplate changes at A2-Z3. Conus medullaris and cauda equina: Conus extends to the L1 level. Conus and cauda equina appear normal. Paraspinal and other soft tissues: Unremarkable. Disc levels: T12-L1 through L2-3: Negative. L3-4: Minimal disc bulging and mild facet and ligamentum flavum hypertrophy without stenosis. L4-5: Disc desiccation and mild disc space narrowing. There is a new  right foraminal and subarticular disc extrusion which demonstrates superior migration in the lateral recess to the L4 pedicle level resulting in lateral recess stenosis with L4 nerve root impingement as well as moderate right neural foraminal stenosis. Circumferential disc bulging and severe facet and ligamentum flavum hypertrophy contribute to mild spinal stenosis, mild left lateral recess stenosis,  and mild left neural foraminal stenosis. Disc and facet degeneration have progressed from 2015, and the disc extrusion is new. L5-S1: Disc desiccation and moderate disc space narrowing. Disc bulging, endplate spurring, disc space height loss, and mild facet hypertrophy result in mild right and moderate left neural foraminal stenosis without spinal stenosis, unchanged from 2015. IMPRESSION: 1. New L4-5 disc extrusion with right L4 nerve root impingement in the lateral recess. 2. Stable chronic disc and facet degeneration at L5-S1 with mild right and moderate left neural foraminal stenosis. Electronically Signed   By: Logan Bores M.D.   On: 02/13/2020 17:04    Procedures Procedures (including critical care time)  Medications Ordered in ED Medications  HYDROmorphone (DILAUDID) injection 1 mg (1 mg Intravenous Given 02/13/20 1306)  dexamethasone (DECADRON) injection 10 mg (10 mg Intravenous Given 02/13/20 1351)  HYDROmorphone (DILAUDID) injection 1 mg (1 mg Intravenous Given 02/13/20 1607)  oxyCODONE-acetaminophen (PERCOCET/ROXICET) 5-325 MG per tablet 1 tablet (1 tablet Oral Given 02/13/20 1838)    ED Course  I have reviewed the triage vital signs and the nursing notes.  Pertinent labs & imaging results that were available during my care of the patient were reviewed by me and considered in my medical decision making (see chart for details).  69 year old with severe low back pain and R leg pain with leg weakness and falls at home. She is hypertensive but otherwise vitals are normal. She is able to range her R leg but it's more difficult and seems to be limited due to pain. Will order pain control, one time dose of steroid, and xray of lumbar spine.   4:27 PM Rechecked pt. Pain is better after IV Dilaudid. Shared visit with Dr. Ashok Cordia. MRI of lumbar spine ordered due to questionable weakness.  At shift change MRI is pending. Care signed out to Dr. Ashok Cordia. Pain is controlled with IV meds.  MDM  Rules/Calculators/A&P                           Final Clinical Impression(s) / ED Diagnoses Final diagnoses:  Herniated intervertebral disc of lumbar spine  Radicular leg pain    Rx / DC Orders ED Discharge Orders    None       Recardo Evangelist, PA-C 02/14/20 [id number]    Lajean Saver, MD 02/14/20 1315

## 2020-02-13 NOTE — ED Notes (Signed)
MRI called 

## 2020-02-14 ENCOUNTER — Other Ambulatory Visit: Payer: Self-pay | Admitting: Internal Medicine

## 2020-02-15 ENCOUNTER — Other Ambulatory Visit: Payer: Self-pay | Admitting: Internal Medicine

## 2020-02-15 ENCOUNTER — Ambulatory Visit: Payer: Self-pay

## 2020-02-15 DIAGNOSIS — I10 Essential (primary) hypertension: Secondary | ICD-10-CM

## 2020-02-15 DIAGNOSIS — E1165 Type 2 diabetes mellitus with hyperglycemia: Secondary | ICD-10-CM

## 2020-02-15 NOTE — Chronic Care Management (AMB) (Signed)
  Care Management    Consult Note  02/15/2020 Name: [name] MRN: [id number] DOB: 02-Feb-1951  Care management team received notification of patient's recent emergency department visit related to Herniated intervertebral disc of lumbar spine and rediculated leg pain.Based on review of health record, [name] is currently active in the embedded care coordination program.. Outbound call placed to the patient to assist with care coordination needs.   Review of patient status, including review of consultants reports, relevant laboratory and other test results, and collaboration with appropriate care team members and the patient's provider was performed as part of comprehensive patient evaluation and provision of chronic care management services.    Plan: SW collaborated with Consulting civil engineer regarding recent ED visit. Next scheduled chronic care management call planned for 02/16/20 with embedded PharmD.  Daneen Schick, BSW, CDP Social Worker, Certified Dementia Practitioner [facility] [phone]

## 2020-02-16 ENCOUNTER — Other Ambulatory Visit: Payer: Self-pay

## 2020-02-16 ENCOUNTER — Ambulatory Visit (INDEPENDENT_AMBULATORY_CARE_PROVIDER_SITE_OTHER): Payer: Medicare Other | Admitting: Family Medicine

## 2020-02-16 ENCOUNTER — Encounter: Payer: Self-pay | Admitting: Family Medicine

## 2020-02-16 ENCOUNTER — Ambulatory Visit: Payer: Self-pay

## 2020-02-16 DIAGNOSIS — G8929 Other chronic pain: Secondary | ICD-10-CM

## 2020-02-16 DIAGNOSIS — M5441 Lumbago with sciatica, right side: Secondary | ICD-10-CM

## 2020-02-16 DIAGNOSIS — E1165 Type 2 diabetes mellitus with hyperglycemia: Secondary | ICD-10-CM

## 2020-02-16 DIAGNOSIS — I10 Essential (primary) hypertension: Secondary | ICD-10-CM

## 2020-02-16 MED ORDER — HYDROMORPHONE HCL 2 MG PO TABS: 1.0000 mg | ORAL_TABLET | Freq: Two times a day (BID) | ORAL | 0 refills | Status: DC | PRN

## 2020-02-16 NOTE — Chronic Care Management (AMB) (Signed)
Chronic Care Management Pharmacy  Name: [name]  MRN: [id number] DOB: 04-29-1951  Chief Complaint/ HPI  [name],  69 y.o. , female presents for their Follow-Up CCM visit with the clinical pharmacist via telephone due to COVID-19 Pandemic. Pt was in significant pain and has been to the ED multiple times since we last spoke for back and leg pain. She was in too much pain to complete our scheduled visit today. Rescheduled for 02/19/20. Pt has appt with Ortho tomorrow and PCP Thursday.   PCP : [name], FNP  Their chronic conditions include: Hypertension, Hyperlipidemia, Asthma, GERD, Diabetes with stage III CKD, Osteoarthritis  Office Visits: 12/31/19 AWV and OV: Presented for Annual exam. Labs ordered (HgbA1c, Lipid panel, CMP, CBC, Vitamin D). BG has been more elevated recently. Started on Farxiga 36m daily. Fair control of BP, continue current medications. Continue to use pain cream for knee pain as needed; if worsens, will consider referral to orthopedics.   10/22/19- OV - Presented for follow up diabetic visit; labs ordered (lipid panel, CMP14+EGFR, HGB A1c, Vit B12, Vit D). Vitamin D and B12 low. Continue Vitamin D supplement and consider B12 injections. Dietary and exercise recommendations provided. Follow up in 3 months.  09/03/19- OV- Presented for follow up diabetic visit; Lunchtime Humalog increased due to elevated BG (30 units before breakfast, 35 units before lunch, 25 units before supper); constipation relieved with Amitiza.   07/22/19- OV- Presented for constipation, bloating and flatulence. Stool softeners and Metamucil have not relieved symptoms. Referred to GI for dysphagia, Labs ordered (lipid panel, CMP14+EGFR, HgbA1c), diabetic foot exam performed, continue checking blood sugar and injecting insulin three times daily w/ ozempic weekly. Start Amitiza for constipation. Follow up in 4-6 weeks. Influenza vaccine administered.   Consult Visit: 02/13/20 ED  visit: Presented for leg pain. Seen in ED 3 days ago for same issue. She fell on buttocks yesterday while using her walker (her leg gave out). Pain is worse with walking. Pt reports shooting pain, pressure, and weakness. MRI showed new L4-5 disc extrusion with right L4 nerve root impingement in lateral recess. 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 No acute fracture. Discharged with 6 days prednisone taper.    02/10/20 ED visit: Presented with right leg pain for past several days. Pt denies trauma or injury. Pain starts in lower back and radiates to thigh, shin, and down her leg. Labs were unremarkable. Hip Xrays unremarkable. CT showed multilevel disease worse on right L4-5. Administered multiple rounds of pain medicine, muscle relaxants, steroids, and NSAIDs. Pain improved, no MRI needed. Refer to spine surgeon outpatient. Prescribed short course of Norco and Baclofen at discharge. Follow up with PCP in 1-2 days.    10/18/19- ED visit: Presented for sudden onset left ear pain 4 days prior with clear, watery drainage; left eardrum found to be perforated upon exam; recommended naproxen or Tylenol for pain and follow up with ENT.   CCM Encounters: 01/15/20 RN: Reviewed and updated care plan and pt goals  09/16/19 PharmD: Comprehensive medication review, patient education. Pt reported issues receiving FreeStyle Libre.   08/07/19 PharmD: Pt recently seen by GI. Failed capsule swallow evaluation. GI to schedule esophageal dilation procedure. Encouraged pt to crush meds as directed due to difficulty swallowing.  07/31/19 PharmD: Working to obtain [employer]. Pt approved for another 6 moths by [employer]   07/29/19 PharmD:  Collaboration with [place]  patient's FreeStyle Libre testing supplies  07/27/19 PharmD: Patient requesting FreeStyle Libre assistance, denied at pharmacy. [place] contacted.     07/22/19 RN: Patient education, medication review, coordination of diabetes testing supplies  07/17/19 RN: Compliance with GI and Urology treatment plans encourage, patient education.  Medications: Outpatient Encounter Medications as of 02/16/2020  Medication Sig  . albuterol (PROAIR HFA) 108 (90 Base) MCG/ACT inhaler 2 puffs every 4 hours as needed only  if your can't catch your breath (Patient taking differently: Inhale 2 puffs into the lungs See admin instructions. 2 puffs every 4 hours as needed only  if your can't catch your breath)  . AMITIZA 8 MCG capsule TAKE 1 CAPSULE(8 MCG) BY MOUTH DAILY WITH BREAKFAST (Patient taking differently: Take 8 mcg by mouth daily with breakfast. )  . amLODipine (NORVASC) 10 MG tablet TAKE 1 TABLET(10 MG) BY MOUTH AT BEDTIME (Patient taking differently: Take 10 mg by mouth every evening. )  . Baclofen 5 MG TABS TAKE 1 TABLET(5MG TOTAL) BY MOUTH THREE TIMES DAILY AS NEEDED** CAN INCREASE TO TAKE 2 TABLETS(10MG TOTAL) IF TOLERATE 5MG (Patient taking differently: Take 5 mg by mouth 3 (three) times daily as needed (pain). CAN INCREASE TO TAKE 2 TABLETS(10MG TOTAL) IF TOLERATE 5MG)  . Continuous Blood Gluc Receiver (FREESTYLE LIBRE READER) DEVI 1 each by Does not apply route See admin instructions. CGM device-Freestyle Libre  . Continuous Blood Gluc Sensor (FREESTYLE LIBRE 14 DAY SENSOR) MISC 1 each by Does not apply route See admin instructions. CGM 14-day sensors; send refills to [facility] (in [place])   . dapagliflozin propanediol (FARXIGA) 5 MG TABS tablet Take 5 mg by mouth daily before breakfast.  . docusate sodium (COLACE) 100 MG capsule Take 100 mg by mouth daily as needed for mild constipation.   . gabapentin (NEURONTIN) 300 MG capsule TAKE 1 CAPSULE(300 MG) BY MOUTH THREE TIMES DAILY (Patient taking differently: Take 300 mg by mouth 3 (three) times daily. )  . HUMALOG 100 UNIT/ML injection INJECT 25 UNITS INTO SKIN WITH BREAFAST AND SUPPER AND 35 UNITS  WITH LUNCH (Patient taking differently: Inject 25-35 Units into the skin 3 (three) times daily with meals. Inject 35 units at breakfast, 25 units at lunch and 35 units at supper)  . HYDROmorphone (DILAUDID) 2 MG tablet Take 0.5-1 tablets (1-2 mg total) by mouth 2 (two) times daily as needed for severe pain.  . Lancets Misc. MISC 1 each by Does not apply route 4 (four) times daily -  with meals and at bedtime.  [name] losartan (COZAAR) 50 MG tablet Take 1 tablet (50 mg total) by mouth daily. (Patient not taking: Reported on 01/15/2020)  . methocarbamol (ROBAXIN) 750 MG tablet Take 750 mg by mouth at bedtime as needed. (Patient not taking: Reported on 02/10/2020)  . mirabegron ER (MYRBETRIQ) 50 MG TB24 tablet Take 1 tablet (50 mg total) by mouth daily.  . mometasone-formoterol (DULERA) 100-5 MCG/ACT AERO Inhale 2 puffs into the lungs as needed for wheezing.  . Multiple Vitamin (MULTIVITAMIN WITH MINERALS) TABS tablet Take 1 tablet by mouth daily.  . naproxen sodium (ALEVE) 220 MG tablet Take 220 mg by mouth 2 (two) times daily as needed (pain).  . Nebivolol HCl (BYSTOLIC) 20 MG TABS Take 1 tablet (20 mg total) by mouth daily. (Patient not taking: Reported on 01/15/2020)  . pantoprazole (PROTONIX) 40 MG tablet Take 30- 60 min before your first and last meals of the day (Patient taking differently: Take 40 mg by mouth every  evening. )  . pravastatin (PRAVACHOL) 80 MG tablet Take 0.5 tablets (40 mg total) by mouth daily.  . predniSONE (DELTASONE) 20 MG tablet 3 po once a day for 2 days, then 2 po once a day for 3 days, then 1 po once a day for 3 days  . Semaglutide, 1 MG/DOSE, (OZEMPIC, 1 MG/DOSE,) 2 MG/1.5ML SOPN Inject 1 mg into the skin every Thursday.  [name] spironolactone (ALDACTONE) 50 MG tablet TAKE 1 TABLET(50 MG) BY MOUTH DAILY (Patient taking differently: Take 50 mg by mouth daily. )  . Vitamin D, Ergocalciferol, (DRISDOL) 1.25 MG (50000 UNIT) CAPS capsule TAKE 1 CAPSULE BY MOUTH EVERY 7 DAYS ON WEDNESDAYS  (Patient taking differently: Take 50,000 Units by mouth every 7 (seven) days. TAKE 1 CAPSULE BY MOUTH EVERY 7 DAYS ON WEDNESDAYS)   No facility-administered encounter medications on file as of 02/16/2020.   Current Diagnosis/Assessment:  Goals Addressed   None                       Diabetes   Recent Relevant Labs: Lab Results  Component Value Date/Time   HGBA1C 5.9 (H) 12/31/2019 04:11 PM   HGBA1C 9.0 (H) 10/22/2019 04:03 PM   MICROALBUR 80 12/31/2019 03:19 PM   MICROALBUR 6.9 (H) 09/07/2016 01:41 PM   MICROALBUR 7.2 (H) 08/30/2015 01:59 PM   Kidney Function Lab Results  Component Value Date/Time   CREATININE 1.30 (H) 02/10/2020 09:32 PM   CREATININE 1.20 (H) 02/10/2020 09:17 PM   GFR 51.07 (L) 05/17/2017 08:54 AM   GFRNONAA 47 (L) 02/10/2020 09:17 PM   GFRAA 54 (L) 02/10/2020 09:17 PM  Stage 3a CKD   Checking BG: Multiple times daily with continuous glucose monitor (FreeStyle Libre)  Recent FBG Readings: 201, 119 Recent pre-meal BG readings:  Recent 2hr PP BG readings:  250, 209, 236, 331, 310, 279, 222, 324, 329 Recent HS BG readings: 294, 316, 320, 61, 87 Patient has failed these meds in past: Invokana, Levemir, Lantus, Victoza, metformin, Actos, Janumet Patient is currently uncontrolled on the following medications:  -Ozempic 48m weekly on Thursdays  -Humalog 25-35 units before meals (25U am, 35U lunch, 25U dinner)          Also doing additional sliding scale every other day (5-10 units) -Farxiga 553mdaily  -Gabapentin 30073mhree times daily  Last diabetic Foot exam: 07/22/19 Last diabetic Eye exam: 08/10/19   We discussed:   Diet extensively  Pt reports drinking about 32 oz of water per day. When we  discussed goal of 64oz daily pt did not feel that was possible.    Encourage increase water intake by 1 glass daily (4-6oz).   Pt reports that appetite is not great, but she knows she has to eat while on insulin  Breakfast: Oatmeal w/ raisins, whole wheat  toast, orange juice  Lunch/Dinner: Tuna sandwich and other sandwiches, vegetable plate (cabbage, greens, etc), not much protein (some fish, beans), Glucerna shake  Recommended increased intake of lean protein at meals  Provided education regarding carbohydrates and elevated BG  Exercise extensively  Pt goes [facility] the [facility] [employer] to walk twice weekly. In general, pt walks twice daily for a total of 35-40 minutes daily  Pt reports that gabapentin helps with pain sometimes  Plan -Continue current medications and follow up with HgbA1c  Hypertension   Office blood pressures are  BP Readings from Last 3 Encounters:  02/13/20 (!) 165/86  02/11/20 118/68  12/31/19 (!) 142/70  Patient has failed these meds in the past: Carvedilol, clonidine, metoprolol, valsartan/HCTZ, losartan/HCTZ Patient is currently uncontrolled on the following medications:  -Amlodipine 91m daily -Spironolactone 54mdaily  Patient checks BP at home once a week and when feeling symptomatic  Patient home BP readings are ranging: 158/90  We discussed:  Discussion with PCP after last visit regarding losartan on medication list, but pt has not been getting filled. Per PCP, leave off of medication list for now and will reassess at next appointment  Diet and exercise extensively  Pt denied adding any additional salt to her foods  Pt not taking Losartan or Bystolic  Plan -Continue current medications   Hyperlipidemia   Lipid Panel     Component Value Date/Time   CHOL 168 12/31/2019 1611   TRIG 88 12/31/2019 1611   HDL 54 12/31/2019 1611   CHOLHDL 3.1 12/31/2019 1611   CHOLHDL 4 09/07/2016 1341   VLDL 15.6 09/07/2016 1341   LDLCALC 98 12/31/2019 1611   LDLDIRECT 152.8 10/09/2013 0938   LABVLDL 16 12/31/2019 1611     The 10-year ASCVD risk score (GMikey BussingC Jr., et al., 2013) is: 32.2%   Values used to calculate the score:     Age: [age]     Sex: Female     Is Non-Hispanic African American: Yes      Diabetic: Yes     Tobacco smoker: No     Systolic Blood Pressure: 16315mHg     Is BP treated: Yes     HDL Cholesterol: 54 mg/dL     Total Cholesterol: 168 mg/dL   Patient has failed these meds in past: N/A Patient is currently uncontrolled on the following medications:  -Pravastatin 80 mg daily, 1/2 tablet daily -Aspirin 8125maily  We discussed:   -Goal LDL < 70 -Diet and exercise extensively  Plan -Continue current medications   GERD   Patient has failed these meds in past: Dexilant, sucralfate Patient is currently controlled on the following medications:  -Pantoprazole 84m34mily  We discussed:   -Pt had to have esophagus stretched 4/5 times in March and has had this done in the past -Pantoprazole helps with her acid reflux  Plan -Continue current medications  Osteoarthritis   Patient has failed these meds in past: Meloxicam, diclofenac, tramadol, Norco, Percocet, Tylenol, Tylenol #3, ibuprofen,  Patient is currently controlled on the following medications:  -Naproxen 220mg88mce daily as needed  We discussed:  -Pt reports taking Aleve as needed for arthritis pain -Concerns regarding Aleve use due to potential for kidney damage, fluid retention, increased BP -Tylenol is a safer alternative for this patient due to CKD and HTN -Pt states that pain is tolerable on Tylenol  Plan -Continue current medications  -Utilizing Tylenol more frequently than Aleve   History of Asthma    Tobacco Status:  Social History   Tobacco Use  Smoking Status Never Smoker  Smokeless Tobacco Never Used   Patient has failed these meds in past: Advair Patient is currently controlled on the following medications:  -Proair 2 puffs every 4 hours as needed -Dulera 2 puffs every 12 hours  Using maintenance inhaler regularly? No Frequency of rescue inhaler use:  infrequently as needed  We discussed:   -Pt has not had to use Dulera in 3 weeks and has not used Proair in over a  year -She [facility]   Plan -Continue current medications  Constipation   Patient has failed these  meds in past: Stool softeners, metamucil Patient is currently controlled on the following medications:  -Amitiza 37mg daily -Docusate 1077mdaily  We discussed: -Goal for water intake is 64oz daily -Pt reports Amitiza helps with constipation and she experiences less stomach pain  Plan -Continue current medications  Overactive bladder    Patient has failed these meds in past: Vesicare, oxybutynin Patient is currently controlled on the following medications:  -Myrbetriq 5053maily  -Nitrofurantion 100m52mily for UTI prevention  We discussed: -Pt reports that her symptoms are better on Myrbetriq  Plan -Continue current medications  Vitamin D Deficiency  Vitamin D: 29.8 on 10/22/19  Patient has failed these meds in past: N/A Patient is currently uncontrolled on the following medications:  -Ergocalciferol 50,000 units daily on Thursdays  Plan -Continue current medications  Vaccines   Reviewed and discussed patient's vaccination history.    Immunization History  Administered Date(s) Administered  . Influenza Whole 06/27/2009, 06/13/2010  . Influenza, High Dose Seasonal PF 07/22/2019  . Influenza,inj,Quad PF,6+ Mos 06/03/2017  . Influenza-Unspecified 03/27/2013, 03/27/2014, 05/18/2015  . Pneumococcal Conjugate-13 07/24/2017  . Pneumococcal Polysaccharide-23 08/27/2006, 11/29/2009, 03/24/2019  . Td 10/18/2009  . Tdap 12/27/2017   -[facility] -No recommendations at this time  Medication Management   Pt uses [facility] all medications  Pt endorses 70% compliance  We discussed:  -Importance of medication adherence  Plan Utilize [facility] for medication synchronization, packaging and delivery  Verbal consent obtained for [facility] enhanced pharmacy services  (medication synchronization, adherence packaging, delivery coordination). A medication sync plan was created to allow patient to get all medications delivered once every 30 to 90 days per patient preference. Patient understands they have freedom to choose pharmacy and clinical pharmacist will coordinate care between all prescribers and [facility].   Follow up: Rescheduled follow up visit for 6/25 at 1:45pm  CourJannette FogoarmD Clinical Pharmacist [facility] [phone]

## 2020-02-16 NOTE — Progress Notes (Signed)
I saw and examined the patient with Dr. Mayer Masker and agree with assessment and plan as outlined.    Severe right-sided sciatica pain for the past 2 weeks, no injury.  ER twice, had MRI showing new right L4-5 HNP with L4 nerve root impingement.  Having urinary incontinence, but sounds more like bladder leakage because she's in too much pain to get to the bathroom in time.  No sign of cauda equina syndrome on MRI.    Temporary rx for dilaudid.  Urgent referral for ESI if able. Also, consult with Dr Lorin Mercy for possible surgical intervention.

## 2020-02-16 NOTE — Progress Notes (Addendum)
[name] - 69 y.o. female MRN [id number]  Date of birth: 1951/04/27  Office Visit Note: Visit Date: 02/16/2020 PCP: [name], MD Referred by: [name], MD  Subjective: Chief Complaint  Patient presents with  . pain x over 2 weeks in right leg, urinary incontinence   HPI: [name] is a 69 y.o. female with history of DM, HTN, CKD who comes in today with low back pain and right sided radiculopathy for the past week. She reports that she was using her walker on 6/15 and felt sharp pain down her right leg. She presented to the ED on 6/16 and had x-rays and CT with DDD at L4-L5 and L5-S1. She was given morphine, dilaudid, solumedrol, valium with some improvement in pain. She returned to ED On 6/19 with worsening pain causing frequent falls. She was given dilaudid which helped pain and had MRI that confirmed disc extrusion at L4-L5 and L5-S1. She has also reported urinary incontinence since this new onset of pain. She has been in intolerable pain since leaving the ED, nothing seems to help. She takes gabapentin 300 mg daily for baseline neuropathy.    ROS Otherwise per HPI.  Assessment & Plan: Visit Diagnoses:  1. Chronic right-sided low back pain with right-sided sciatica     Plan: 69 yo female in intolerable pain secondary to bulging disc in lumbar spine. Will order a short course of dilaudid to help with pain. Will refer for ESI as soon as available and will also refer to Dr. Lorin Mercy for surgical discussion. Unclear what is causing urinary incontinence at this time-- possibly related to pain and higher glucose levels secondary to steroids.   Meds & Orders:  Meds ordered this encounter  Medications  . HYDROmorphone (DILAUDID) 2 MG tablet    Sig: Take 0.5-1 tablets (1-2 mg total) by mouth 2 (two) times daily as needed for severe pain.    Dispense:  20 tablet    Refill:  0    Orders Placed This Encounter  Procedures  . Ambulatory referral to Physical  Medicine Rehab    Follow-up: No follow-ups on file.   Procedures: No procedures performed  No notes on file   Clinical History: No specialty comments available.   She reports that she has never smoked. She has never used smokeless tobacco.  Recent Labs    07/22/19 0919 10/22/19 1603 12/31/19 1611  HGBA1C 9.3* 9.0* 5.9*    Objective:  VS:  HT:    WT:   BMI:     BP:   HR: bpm  TEMP: ( )  RESP:  Physical Exam  PHYSICAL EXAM: Gen: lying on exam table, tearful, in distress, non-toxic HEENT: clear conjunctiva,  CV:  no edema, capillary refill brisk, normal rate Resp: non-labored Skin: no rashes, normal turgor  Neuro: no gross deficits.  Psych:  alert and oriented  Ortho Exam  Lumbar spine: - Inspection: no gross deformities - Palpation: TTP over L5-S1, tender to touch down entire right extremity - ROM: limited mobility secondary to pain - Strength: 5/5 strength of lower extremity in L4-S1 nerve root distributions- pain with movements - Neuro: sensation intact in the L4-S1 nerve root distribution b/l - Special testing: positive straight leg test  Imaging: No results found.  Past Medical/Family/Surgical/Social History: Medications & Allergies reviewed per EMR, new medications updated. Patient Active Problem List   Diagnosis Date Noted  . Syncope due to orthostatic hypotension 05/28/2019  . Hypertensive urgency 04/06/2019  . Recurrent UTI  04/06/2019  . Wound infection 03/06/2019  . Localized swelling, mass and lump, multiple sites 03/27/2018  . Type II diabetes mellitus with renal manifestations ([place]) 12/27/2017  . HLD (hyperlipidemia) 12/27/2017  . HTN (hypertension) 12/27/2017  . CKD (chronic kidney disease), stage III 12/27/2017  . Syncope 12/27/2017  . Cough variant asthma 12/01/2015  . Osteoarthritis of left knee, primary localized 08/17/2014  . Knee osteoarthritis 08/17/2014  . Insulin pump in place 12/21/2013  . Rhabdomyolysis 11/15/2013  . Elevated  lactic acid level 11/15/2013  . Hypoglycemia 11/15/2013  . Other and unspecified hyperlipidemia 08/06/2013  . Type II diabetes mellitus, uncontrolled ([place]) 07/20/2013  . Varicose veins of lower extremities with other complications 09/38/1829  . Fibromyalgia   . GERD 05/18/2010  . URINARY TRACT INFECTION, MONILIAL 05/05/2010  . UTI 05/02/2010  . ABDOMINAL PAIN, GENERALIZED 05/02/2010  . OBESITY 04/20/2010  . BURSITIS, LEFT SHOULDER 03/09/2010  . CANDIDIASIS OF VULVA AND VAGINA 01/10/2010  . Lipoma of arm s/p excision 01/29/2014 01/10/2010  . DEPRESSION 12/27/2009  . BACK PAIN WITH RADICULOPATHY 12/27/2009  . CYSTITIS, ACUTE 10/21/2009  . MICROSCOPIC HEMATURIA 10/18/2009  . KNEE PAIN, BILATERAL 10/18/2009  . NEUROPATHY 09/08/2009  . HYPERTENSION, BENIGN ESSENTIAL 09/08/2009  . Asthma 09/08/2009  . STRESS INCONTINENCE 09/08/2009  . CHEST PAIN 07/19/2009  . ESOPHAGEAL STRICTURE 08/27/2005  . Diabetes mellitus without complication ([place]) 93/71/6967   Past Medical History:  Diagnosis Date  . Arthritis    L knee, hands, back   . Asthma   . Diabetes mellitus type 2, insulin dependent ([place])   . Fibromyalgia   . Full dentures   . GERD (gastroesophageal reflux disease)   . H/O hiatal hernia   . Headache    h/o migraines - was followed for a time with a wellness doctor  . History of esophageal dilatation   . History of rhabdomyolysis    03/ 2015  . Hyperlipidemia   . Hypertension   . Insulin pump in place    since 12/ 2014--  [employer]  . Osteoarthritis of left knee, primary localized 08/17/2014  . Renal insufficiency   . SUI (stress urinary incontinence, female)   . Wears glasses    Family History  Problem Relation Age of Onset  . Diabetes Mother   . Hypertension Mother   . Lung cancer Mother        smoked  . Diabetes Father   . Hypertension Father   . Lung cancer Father        smoked  . Diabetes Brother   . Heart failure Sister   . Diabetes Maternal Grandmother   .  Cancer Maternal Grandmother    Past Surgical History:  Procedure Laterality Date  . BLADDER SUSPENSION N/A 03/09/2014   Procedure: CYSTOSCOPY/SLING;  Surgeon: Reece Packer, MD;  Location: [facility];  Service: Urology;  Laterality: N/A;  . CARDIAC CATHETERIZATION  07-20-2009   DR Shelva Majestic   MODERATE LVH/  NORMAL  LVEF/  NORMAL CORONARY AND RENAL ARTERIES  . CARPAL TUNNEL RELEASE Right 2000  . CHOLECYSTECTOMY  1990  . COLONOSCOPY WITH ESOPHAGOGASTRODUODENOSCOPY (EGD)  06-18-2002  . ESOPHAGOGASTRODUODENOSCOPY (EGD) WITH ESOPHAGEAL DILATION  06-12-2010  . EXCISION LEFT UPPER ARM MASS  01-29-2014  . EYE SURGERY  2010   laser on R eye  . INTERSTIM IMPLANT REMOVAL N/A 03/06/2019   Procedure: REMOVAL OF INTERSTIM IMPLANT;  Surgeon: Bjorn Loser, MD;  Location: [facility];  Service: Urology;  Laterality: N/A;  . KNEE ARTHROSCOPY  Right 1989  &  2002  . NEGATIVE SLEEP STUDY  2012   PER PT  . PARTIAL KNEE ARTHROPLASTY Left 08/17/2014   Procedure: LEFT UNICOMPARTMENTAL KNEE;  Surgeon: Johnny Bridge, MD;  Location: [facility];  Service: Orthopedics;  Laterality: Left;   Social History   Occupational History  . Occupation: disability  Tobacco Use  . Smoking status: Never Smoker  . Smokeless tobacco: Never Used  Vaping Use  . Vaping Use: Never used  Substance and Sexual Activity  . Alcohol use: No  . Drug use: No  . Sexual activity: Not Currently

## 2020-02-16 NOTE — Progress Notes (Signed)
I left voicemail for patient. Worked in to schedule tomorrow morning.

## 2020-02-17 ENCOUNTER — Ambulatory Visit (INDEPENDENT_AMBULATORY_CARE_PROVIDER_SITE_OTHER): Payer: Medicare Other | Admitting: Orthopaedic Surgery

## 2020-02-17 ENCOUNTER — Other Ambulatory Visit (HOSPITAL_COMMUNITY)
Admission: RE | Admit: 2020-02-17 | Discharge: 2020-02-17 | Disposition: A | Discharge status: Home / Self Care | Payer: Medicare Other | Source: Ambulatory Visit | Attending: Orthopaedic Surgery | Admitting: Orthopaedic Surgery

## 2020-02-17 ENCOUNTER — Other Ambulatory Visit: Payer: Self-pay

## 2020-02-17 ENCOUNTER — Encounter: Payer: Self-pay | Admitting: Orthopaedic Surgery

## 2020-02-17 DIAGNOSIS — Z20822 Contact with and (suspected) exposure to covid-19: Secondary | ICD-10-CM | POA: Insufficient documentation

## 2020-02-17 DIAGNOSIS — M5126 Other intervertebral disc displacement, lumbar region: Secondary | ICD-10-CM

## 2020-02-17 DIAGNOSIS — Z01812 Encounter for preprocedural laboratory examination: Secondary | ICD-10-CM | POA: Insufficient documentation

## 2020-02-17 LAB — SARS CORONAVIRUS 2 (TAT 6-24 HRS): SARS Coronavirus 2: NEGATIVE

## 2020-02-17 NOTE — Progress Notes (Signed)
Office Visit Note   Patient: [name]           Date of Birth: 1951-04-23           MRN: [id number] Visit Date: 02/17/2020              Requested by: [name], [address] [place],  [place] [address] PCP: [name], MD   Assessment & Plan: Visit Diagnoses:  1. Herniated nucleus pulposus, lumbar            With right radiculopathy  Plan: Patient is already eaten today.  We will posted for surgery Friday morning for microdiscectomy for removal of migrated fragment.  MRI scan images reviewed with patient and partner.  Plan would be overnight stay in the hospital.  We discussed potential for risk of repeat rupture which is 5 to 10% for L4-5 level.  She has a migrated fragment which is up against the nerve root laterally at the level of the pedicle.  We discussed right hemilaminectomy for exposure and removal of free fragment.  Questions were elicited and answered she understands request to proceed.  Follow-Up Instructions: one week post op   Orders:  No orders of the defined types were placed in this encounter.  No orders of the defined types were placed in this encounter.     Procedures: No procedures performed   Clinical Data: No additional findings.   Subjective: Chief Complaint  Patient presents with   Lower Back - Pain    HPI 69 year old female referred to me by Dr. Eunice Blase for lumbar HNP with severe excruciating pain.  Patient had morphine, Dilaudid, Solu-Medrol IV, Medrol Dosepak p.o., Valium, Neurontin.  She is on the exam table writhing in pain she has had some intermittent incontinence cannot make it to bedside commode cannot stand cannot sit and has not been able to sleep.  She is here with her female partner who states that they cannot stand it.  When she stands she states her right leg buckles on her despite using a walker.  If she is not holding on tight she has fallen.  MRI scan done on 02/13/2020 shows L4-5 HNP  extruded fragment with cephalad migration laterally up to the level of the pedicle with severe lateral recess stenosis with mild central stenosis and left neuroforaminal stenosis.  Patient does have some disc degeneration L5-S1 without compression.  Patient had previous MRI 2015 and at that time L4-5 disc showed some mild degeneration but no extruded fragments.  Patient was here in the office for over an hour and constantly cried out, was crying sobbing and moaning.  She states the Dilaudid that she is having to take regularly every 4 hours has given her a little bit of relief.  Patient's female partner is here and confirms patient's history.  Patient does have diabetes and she has had good control with A1c 5.9 but states with the pain she states her sugars have been running 200-300.  She has been to the emergency room several times glucose at that time was 240 with increased creatinine 1.2.  Patient is bleeding please do something something has to be done.  ER visits for severe 10 out of 10 pain 6/19 and 6/16.  Review of Systems positive for diabetes fibromyalgia GERD hypertension hyperlipidemia.  She does have some arthritis in her left knee.   Objective: Vital Signs: Ht 5\' 4"  (1.626 m)    Wt 157 lb (71.2 kg)  LMP  (LMP Unknown)    BMI 26.95 kg/m   Physical Exam Constitutional:      Appearance: She is well-developed.  HENT:     Head: Normocephalic.     Right Ear: External ear normal.     Left Ear: External ear normal.  Eyes:     Pupils: Pupils are equal, round, and reactive to light.  Neck:     Thyroid: No thyromegaly.     Trachea: No tracheal deviation.  Cardiovascular:     Rate and Rhythm: Normal rate.  Pulmonary:     Effort: Pulmonary effort is normal.  Abdominal:     Palpations: Abdomen is soft.  Skin:    General: Skin is warm and dry.  Neurological:     Mental Status: She is alert and oriented to person, place, and time.  Psychiatric:        Behavior: Behavior normal.      Ortho Exam patient has negative logroll's right leg she has weakness of the quad and is barely able to do a straight leg raise.  Anterior tib gastrocsoleus is strong.  No perineal numbness.  Opposite left leg shows negative straight leg raising 90 degrees.  She has pain with straight leg raise on the right leg 45 degrees supine position in sitting.  She continues to rock back-and-forth and cry out, moans and continues to cry.  Decreased sensation anterolateral thigh distal lateral calf.  Sensation and plantar foot is normal.  Distal pulses are 2+ no calf tenderness.  Specialty Comments:  No specialty comments available.  Imaging: Narrative & Impression  CLINICAL DATA:  Lumbar radiculopathy. Nontraumatic right leg pain, predominantly at the lateral aspect of the thigh, with right leg weakness for 4 days.  EXAM: MRI LUMBAR SPINE WITHOUT CONTRAST  TECHNIQUE: Multiplanar, multisequence MR imaging of the lumbar spine was performed. No intravenous contrast was administered.  COMPARISON:  Lumbar spine radiographs 02/13/2020. Lumbar spine CT 02/10/2020. Lumbar spine MRI 12/19/2013.  FINDINGS: Segmentation:  5 lumbar type vertebrae.  Rudimentary S1-2 disc.  Alignment:  Borderline anterolisthesis of L4 on L5.  Vertebrae: No fracture, significant marrow edema, or evidence of discitis. 9 mm STIR hyperintense lesion in the right L3 pedicle, unchanged from 2015 and therefore likely benign. Mild chronic degenerative endplate changes at N6-E9.  Conus medullaris and cauda equina: Conus extends to the L1 level. Conus and cauda equina appear normal.  Paraspinal and other soft tissues: Unremarkable.  Disc levels:  T12-L1 through L2-3: Negative.  L3-4: Minimal disc bulging and mild facet and ligamentum flavum hypertrophy without stenosis.  L4-5: Disc desiccation and mild disc space narrowing. There is a new right foraminal and subarticular disc extrusion which  demonstrates superior migration in the lateral recess to the L4 pedicle level resulting in lateral recess stenosis with L4 nerve root impingement as well as moderate right neural foraminal stenosis. Circumferential disc bulging and severe facet and ligamentum flavum hypertrophy contribute to mild spinal stenosis, mild left lateral recess stenosis, and mild left neural foraminal stenosis. Disc and facet degeneration have progressed from 2015, and the disc extrusion is new.  L5-S1: Disc desiccation and moderate disc space narrowing. Disc bulging, endplate spurring, disc space height loss, and mild facet hypertrophy result in mild right and moderate left neural foraminal stenosis without spinal stenosis, unchanged from 2015.  IMPRESSION: 1. New L4-5 disc extrusion with right L4 nerve root impingement in the lateral recess. 2. Stable chronic disc and facet degeneration at L5-S1 with mild right and moderate left  neural foraminal stenosis.   Electronically Signed   By: Logan Bores M.D.   On: 02/13/2020 17:04       PMFS History: Patient Active Problem List   Diagnosis Date Noted   Herniated nucleus pulposus, lumbar 02/17/2020   Syncope due to orthostatic hypotension 05/28/2019   Hypertensive urgency 04/06/2019   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   Wound infection 03/06/2019   Localized swelling, mass and lump, multiple sites 03/27/2018   Type II diabetes mellitus with renal manifestations ([place]) 12/27/2017   HLD (hyperlipidemia) 12/27/2017   HTN (hypertension) 12/27/2017   CKD (chronic kidney disease), stage III 12/27/2017   Syncope 12/27/2017   Cough variant asthma 12/01/2015   Osteoarthritis of left knee, primary localized 08/17/2014   Knee osteoarthritis 08/17/2014   Insulin pump in place 12/21/2013   Rhabdomyolysis 11/15/2013   Elevated lactic acid level 11/15/2013   Hypoglycemia 11/15/2013   Other and unspecified hyperlipidemia 08/06/2013   Type II  diabetes mellitus, uncontrolled ([place]) 07/20/2013   Varicose veins of lower extremities with other complications 12/75/1700   Fibromyalgia    GERD 05/18/2010   URINARY TRACT INFECTION, MONILIAL 05/05/2010   UTI 05/02/2010   ABDOMINAL PAIN, GENERALIZED 05/02/2010   OBESITY 04/20/2010   BURSITIS, LEFT SHOULDER 03/09/2010   CANDIDIASIS OF VULVA AND VAGINA 01/10/2010   Lipoma of arm s/p excision 01/29/2014 01/10/2010   DEPRESSION 12/27/2009   BACK PAIN WITH RADICULOPATHY 12/27/2009   CYSTITIS, ACUTE 10/21/2009   MICROSCOPIC HEMATURIA 10/18/2009   KNEE PAIN, BILATERAL 10/18/2009   NEUROPATHY 09/08/2009   HYPERTENSION, BENIGN ESSENTIAL 09/08/2009   Asthma 09/08/2009   STRESS INCONTINENCE 09/08/2009   CHEST PAIN 07/19/2009   ESOPHAGEAL STRICTURE 08/27/2005   Diabetes mellitus without complication ([place]) 17/49/4496   Past Medical History:  Diagnosis Date   Arthritis    L knee, hands, back    Asthma    Diabetes mellitus type 2, insulin dependent (HCC)    Fibromyalgia    Full dentures    GERD (gastroesophageal reflux disease)    H/O hiatal hernia    Headache    h/o migraines - was followed for a time with a wellness doctor   History of esophageal dilatation    History of rhabdomyolysis    03/ 2015   Hyperlipidemia    Hypertension    Insulin pump in place    since 12/ 2014--  [employer]   Osteoarthritis of left knee, primary localized 08/17/2014   Renal insufficiency    SUI (stress urinary incontinence, female)    Wears glasses     Family History  Problem Relation Age of Onset   Diabetes Mother    Hypertension Mother    Lung cancer Mother        smoked   Diabetes Father    Hypertension Father    Lung cancer Father        smoked   Diabetes Brother    Heart failure Sister    Diabetes Maternal Grandmother    Cancer Maternal Grandmother     Past Surgical History:  Procedure Laterality Date   BLADDER SUSPENSION N/A  03/09/2014   Procedure: CYSTOSCOPY/SLING;  Surgeon: Reece Packer, MD;  Location: [facility];  Service: Urology;  Laterality: N/A;   CARDIAC CATHETERIZATION  07-20-2009   DR Shelva Majestic   MODERATE LVH/  NORMAL  LVEF/  NORMAL CORONARY AND RENAL ARTERIES   CARPAL TUNNEL RELEASE Right 2000   CHOLECYSTECTOMY  1990   COLONOSCOPY WITH ESOPHAGOGASTRODUODENOSCOPY (EGD)  06-18-2002  ESOPHAGOGASTRODUODENOSCOPY (EGD) WITH ESOPHAGEAL DILATION  06-12-2010   EXCISION LEFT UPPER ARM MASS  01-29-2014   EYE SURGERY  2010   laser on R eye   INTERSTIM IMPLANT REMOVAL N/A 03/06/2019   Procedure: REMOVAL OF Barrie Lyme IMPLANT;  Surgeon: Bjorn Loser, MD;  Location: [facility];  Service: Urology;  Laterality: N/A;   KNEE ARTHROSCOPY Right 1989  &  2002   NEGATIVE SLEEP STUDY  2012   PER PT   PARTIAL KNEE ARTHROPLASTY Left 08/17/2014   Procedure: LEFT UNICOMPARTMENTAL KNEE;  Surgeon: Johnny Bridge, MD;  Location: [facility];  Service: Orthopedics;  Laterality: Left;   Social History   Occupational History   Occupation: disability  Tobacco Use   Smoking status: Never Smoker   Smokeless tobacco: Never Used  Scientific laboratory technician Use: Never used  Substance and Sexual Activity   Alcohol use: No   Drug use: No   Sexual activity: Not Currently

## 2020-02-18 ENCOUNTER — Ambulatory Visit: Payer: Medicare Other | Admitting: Nurse Practitioner

## 2020-02-18 ENCOUNTER — Other Ambulatory Visit: Payer: Self-pay

## 2020-02-18 ENCOUNTER — Encounter (HOSPITAL_COMMUNITY): Payer: Self-pay | Admitting: Orthopaedic Surgery

## 2020-02-18 NOTE — Progress Notes (Signed)
PA, Anesthesiology, asked to review pt history. 

## 2020-02-18 NOTE — Progress Notes (Signed)
Anesthesia Chart Review:  Pt is a same day work up   Case: [id number] Date/Time: 02/19/20 1019   Procedure: right L4 hemiaminectomy, microdiscectomy (N/A Spine Lumbar)   Anesthesia type: General   Pre-op diagnosis: right L4-5 herniated nucleus pulposus   Location: [facility] ROOM 06 / [facility]   Surgeons: Marybelle Killings, MD      DISCUSSION:  Pt is 69 years old with hx aortic stenosis (mild by 04/07/19 echo), HTN, DM (uses insulin pump), renal insufficiency.    PROVIDERS: - PCP is [name], MD   LABS:  Will be obtained day of surgery    EKG 04/06/19: NSR. Possible Anterior infarct, age undetermined. Appears stable when compared with prior EKG 12/26/17   CV:  Echo 04/07/19:  1.LV normal systolic function with EF 60-65%. The cavity size was normal. There is mildly increased LV wall thickness. LV diastolic Doppler parameters are consistent with impaired relaxation.  2. RV normal systolic function. The cavity was normal. There is no increase in RV wall thickness.  3. Mild thickening of the mitral valve leaflet.  4. The aortic valve is tricuspid. Moderate thickening of the aortic valve. Moderate calcification of the aortic valve. Mild stenosis of the aortic valve.  5. Calcified leaflet tip of non coronary cusp.  6. The aorta is normal in size and structure.   Cardiac event monitor 01/02/18: No arrhythmias noted   Carotid duplex 12/27/17:  - Right Carotid: The extracranial vessels were near-normal with only minimal  wall thickening or plaque.  - Left Carotid: The extracranial vessels were near-normal with only minimal  wall thickening or plaque.  - Vertebrals: Bilateral vertebral arteries demonstrate antegrade flow.   Nuclear stress test 11/23/15:   The left ventricular ejection fraction is hyperdynamic (>65%).  Nuclear stress EF: 67%.  Blood pressure demonstrated a hypertensive response to exercise.  There was no ST segment deviation noted during stress.  The study is  normal.  This is a low risk study.   Past Medical History:  Diagnosis Date  . Aortic stenosis    mild on 04/07/19 echo  . Arthritis    L knee, hands, back   . Asthma   . Diabetes mellitus type 2, insulin dependent ([place])   . Fibromyalgia   . Full dentures   . Full dentures   . GERD (gastroesophageal reflux disease)   . H/O hiatal hernia   . Headache    h/o migraines - was followed for a time with a wellness doctor  . History of esophageal dilatation   . History of rhabdomyolysis    03/ 2015  . Hyperlipidemia   . Hypertension   . Insulin pump in place    since 12/ 2014--  [employer]  . Lumbar disc herniation    right leg weakness  . Osteoarthritis of left knee, primary localized 08/17/2014  . Renal insufficiency   . SUI (stress urinary incontinence, female)   . Wears glasses   . Wears glasses     Past Surgical History:  Procedure Laterality Date  . BLADDER SUSPENSION N/A 03/09/2014   Procedure: CYSTOSCOPY/SLING;  Surgeon: Reece Packer, MD;  Location: [facility];  Service: Urology;  Laterality: N/A;  . CARDIAC CATHETERIZATION  07-20-2009   DR Shelva Majestic   MODERATE LVH/  NORMAL  LVEF/  NORMAL CORONARY AND RENAL ARTERIES  . CARPAL TUNNEL RELEASE Right 2000  . CHOLECYSTECTOMY  1990  . COLONOSCOPY WITH ESOPHAGOGASTRODUODENOSCOPY (EGD)  06-18-2002  . ESOPHAGOGASTRODUODENOSCOPY (EGD) WITH  ESOPHAGEAL DILATION  06-12-2010  . EXCISION LEFT UPPER ARM MASS  01-29-2014  . EYE SURGERY  2010   laser on R eye  . INTERSTIM IMPLANT REMOVAL N/A 03/06/2019   Procedure: REMOVAL OF INTERSTIM IMPLANT;  Surgeon: Bjorn Loser, MD;  Location[facility];  Service: Urology;  Laterality: N/A;  . KNEE ARTHROSCOPY Right 1989  &  2002  . MULTIPLE TOOTH EXTRACTIONS    . NEGATIVE SLEEP STUDY  2012   PER PT  . PARTIAL KNEE ARTHROPLASTY Left 08/17/2014   Procedure: LEFT UNICOMPARTMENTAL KNEE;  Surgeon: Johnny Bridge, MD;  Location: [facility];  Service: Orthopedics;  Laterality:  Left;    MEDICATIONS: No current facility-administered medications for this encounter.   [name] albuterol (PROAIR HFA) 108 (90 Base) MCG/ACT inhaler  . AMITIZA 8 MCG capsule  . amLODipine (NORVASC) 10 MG tablet  . Baclofen 5 MG TABS  . Continuous Blood Gluc Receiver (FREESTYLE LIBRE READER) DEVI  . Continuous Blood Gluc Sensor (FREESTYLE LIBRE 14 DAY SENSOR) MISC  . dapagliflozin propanediol (FARXIGA) 5 MG TABS tablet  . docusate sodium (COLACE) 100 MG capsule  . gabapentin (NEURONTIN) 300 MG capsule  . HUMALOG 100 UNIT/ML injection  . HYDROmorphone (DILAUDID) 2 MG tablet  . Lancets Misc. MISC  . losartan (COZAAR) 50 MG tablet  . methocarbamol (ROBAXIN) 750 MG tablet  . mirabegron ER (MYRBETRIQ) 50 MG TB24 tablet  . mometasone-formoterol (DULERA) 100-5 MCG/ACT AERO  . Multiple Vitamin (MULTIVITAMIN WITH MINERALS) TABS tablet  . naproxen sodium (ALEVE) 220 MG tablet  . Nebivolol HCl (BYSTOLIC) 20 MG TABS  . pantoprazole (PROTONIX) 40 MG tablet  . pravastatin (PRAVACHOL) 80 MG tablet  . predniSONE (DELTASONE) 20 MG tablet  . Semaglutide, 1 MG/DOSE, (OZEMPIC, 1 MG/DOSE,) 2 MG/1.5ML SOPN  . spironolactone (ALDACTONE) 50 MG tablet  . Vitamin D, Ergocalciferol, (DRISDOL) 1.25 MG (50000 UNIT) CAPS capsule    If labs acceptable day of surgery, I anticipate pt can proceed with surgery as scheduled.  Willeen Cass, FNP-BC [facility] Short Stay [facility]/Anesthesiology Phone: [phone] 02/18/2020 11:05 AM

## 2020-02-18 NOTE — Anesthesia Preprocedure Evaluation (Addendum)
Anesthesia Evaluation  Patient identified by MRN, date of birth, ID band Patient awake    Reviewed: Allergy & Precautions, NPO status , Patient's Chart, lab work & pertinent test results, reviewed documented beta blocker date and time   History of Anesthesia Complications Negative for: history of anesthetic complications  Airway Mallampati: II  TM Distance: >3 FB Neck ROM: Full    Dental  (+) Edentulous Upper, Edentulous Lower   Pulmonary asthma ,    Pulmonary exam normal        Cardiovascular hypertension, Pt. on medications and Pt. on home beta blockers Normal cardiovascular exam  Echo 04/07/19: EF 60-65%, impaired relaxation, mild AS    Neuro/Psych  Headaches, Depression    GI/Hepatic Neg liver ROS, hiatal hernia, GERD  Medicated and Controlled,  Endo/Other  diabetes, Type 2, Insulin Dependent  Renal/GU Renal InsufficiencyRenal disease  negative genitourinary   Musculoskeletal  (+) Arthritis , Osteoarthritis,  Fibromyalgia -  Abdominal   Peds  Hematology negative hematology ROS (+)   Anesthesia Other Findings Day of surgery medications reviewed with patient.  Reproductive/Obstetrics negative OB ROS                           Anesthesia Physical Anesthesia Plan  ASA: II  Anesthesia Plan: General   Post-op Pain Management:    Induction: Intravenous  PONV Risk Score and Plan: 4 or greater and Treatment may vary due to age or medical condition, Ondansetron, Dexamethasone and Midazolam  Airway Management Planned: Oral ETT  Additional Equipment: None  Intra-op Plan:   Post-operative Plan: Extubation in OR  Informed Consent: I have reviewed the patients History and Physical, chart, labs and discussed the procedure including the risks, benefits and alternatives for the proposed anesthesia with the patient or authorized representative who has indicated his/her understanding and  acceptance.     Dental advisory given  Plan Discussed with: CRNA  Anesthesia Plan Comments: (See APP note by Durel Salts, FNP)      Anesthesia Quick Evaluation

## 2020-02-18 NOTE — Progress Notes (Signed)
SDW-pre-op call completed by both pt and S/O Helene Kelp (DPR ) via speaker phone. Pt denies SOB, chest pain, and being under the care of a cardiologist. Pt PCP is [name]. Pt denies having a chest x ray. Pt denies recent labs. Pt made aware to stop taking  Aspirin (unless otherwise advised by surgeon), vitamins, fish oil and herbal medications. Do not take any NSAIDs ie: Ibuprofen, Advil, Naproxen (Aleve), Motrin, BC and Goody Powder. Pt made aware to hold [place] today and tomorrow. Pt made aware to hold Humalog the morning of surgery ( taken with meals). Pt unable to tolerate morning dose of Prednisone 20 mg (NPO); please consider IV Prednisone on DOS. Pt stated that fasting CBG's range in the 200's. Pt made aware to check CBG every 2 hours prior to arrival to hospital on DOS. Pt made aware to treat a CBG < 70 with 4 glucose tabs or glucose gel or 4 ounces of apple or cranberry juice, wait 15 minutes after intervention to recheck CBG, if CBG remains < 70, call Short Stay unit to speak with a nurse. Pt reminded to continue to quarantine. Both pt and S/O verbalized understanding of all pre-op instructions.

## 2020-02-18 NOTE — H&P (Signed)
Signed           Office Visit Note              Patient: [name]                                   Date of Birth: [birth year]                                                  MRN: [id number] Visit Date: 02/17/2020                                                                     Requested by: [name], [address] [place],  [place] [address] PCP: [name], MD   Assessment & Plan: Visit Diagnoses:  1. Herniated nucleus pulposus, lumbar            With right radiculopathy  Plan: Patient is already eaten today.  We will posted for surgery Friday morning for microdiscectomy for removal of migrated fragment.  MRI scan images reviewed with patient and partner.  Plan would be overnight stay in the hospital.  We discussed potential for risk of repeat rupture which is 5 to 10% for L4-5 level.  She has a migrated fragment which is up against the nerve root laterally at the level of the pedicle.  We discussed right hemilaminectomy for exposure and removal of free fragment.  Questions were elicited and answered she understands request to proceed.  Follow-Up Instructions: one week post op   Orders:  No orders of the defined types were placed in this encounter.  No orders of the defined types were placed in this encounter.     Procedures: No procedures performed   Clinical Data: No additional findings.   Subjective:    Chief Complaint  Patient presents with  . Lower Back - Pain    HPI 69 year old female referred to me by Dr. Eunice Blase for lumbar HNP with severe excruciating pain.  Patient had morphine, Dilaudid, Solu-Medrol IV, Medrol Dosepak p.o., Valium, Neurontin.  She is on the exam table writhing in pain she has had some intermittent incontinence cannot make it to bedside commode cannot stand cannot sit and has not been able to sleep.  She is here with her female partner who states that they cannot stand it.  When she  stands she states her right leg buckles on her despite using a walker.  If she is not holding on tight she has fallen.  MRI scan done on 02/13/2020 shows L4-5 HNP extruded fragment with cephalad migration laterally up to the level of the pedicle with severe lateral recess stenosis with mild central stenosis and left neuroforaminal stenosis.  Patient does have some disc degeneration L5-S1 without compression.  Patient had previous MRI 2015 and at that time L4-5 disc showed some mild degeneration but no extruded fragments.  Patient was here in the office for over an hour and constantly cried out, was crying sobbing and moaning.  She states the  Dilaudid that she is having to take regularly every 4 hours has given her a little bit of relief.  Patient's female partner is here and confirms patient's history.  Patient does have diabetes and she has had good control with A1c 5.9 but states with the pain she states her sugars have been running 200-300.  She has been to the emergency room several times glucose at that time was 240 with increased creatinine 1.2.  Patient is bleeding please do something something has to be done.  ER visits for severe 10 out of 10 pain 6/19 and 6/16.  Review of Systems positive for diabetes fibromyalgia GERD hypertension hyperlipidemia.  She does have some arthritis in her left knee.   Objective: Vital Signs: Ht 5\' 4"  (1.626 m)   Wt 157 lb (71.2 kg)   LMP  (LMP Unknown)   BMI 26.95 kg/m   Physical Exam Constitutional:      Appearance: She is well-developed.  HENT:     Head: Normocephalic.     Right Ear: External ear normal.     Left Ear: External ear normal.  Eyes:     Pupils: Pupils are equal, round, and reactive to light.  Neck:     Thyroid: No thyromegaly.     Trachea: No tracheal deviation.  Cardiovascular:     Rate and Rhythm: Normal rate.  Pulmonary:     Effort: Pulmonary effort is normal.  Abdominal:     Palpations: Abdomen is soft.  Skin:    General:  Skin is warm and dry.  Neurological:     Mental Status: She is alert and oriented to person, place, and time.  Psychiatric:        Behavior: Behavior normal.     Ortho Exam patient has negative logroll's right leg she has weakness of the quad and is barely able to do a straight leg raise.  Anterior tib gastrocsoleus is strong.  No perineal numbness.  Opposite left leg shows negative straight leg raising 90 degrees.  She has pain with straight leg raise on the right leg 45 degrees supine position in sitting.  She continues to rock back-and-forth and cry out, moans and continues to cry.  Decreased sensation anterolateral thigh distal lateral calf.  Sensation and plantar foot is normal.  Distal pulses are 2+ no calf tenderness.  Specialty Comments:  No specialty comments available.  Imaging: Narrative & Impression  CLINICAL DATA: Lumbar radiculopathy. Nontraumatic right leg pain, predominantly at the lateral aspect of the thigh, with right leg weakness for 4 days.  EXAM: MRI LUMBAR SPINE WITHOUT CONTRAST  TECHNIQUE: Multiplanar, multisequence MR imaging of the lumbar spine was performed. No intravenous contrast was administered.  COMPARISON: Lumbar spine radiographs 02/13/2020. Lumbar spine CT 02/10/2020. Lumbar spine MRI 12/19/2013.  FINDINGS: Segmentation: 5 lumbar type vertebrae. Rudimentary S1-2 disc.  Alignment: Borderline anterolisthesis of L4 on L5.  Vertebrae: No fracture, significant marrow edema, or evidence of discitis. 9 mm STIR hyperintense lesion in the right L3 pedicle, unchanged from 2015 and therefore likely benign. Mild chronic degenerative endplate changes at M3-W4.  Conus medullaris and cauda equina: Conus extends to the L1 level. Conus and cauda equina appear normal.  Paraspinal and other soft tissues: Unremarkable.  Disc levels:  T12-L1 through L2-3: Negative.  L3-4: Minimal disc bulging and mild facet and ligamentum  flavum hypertrophy without stenosis.  L4-5: Disc desiccation and mild disc space narrowing. There is a new right foraminal and subarticular disc extrusion which demonstrates superior migration in  the lateral recess to the L4 pedicle level resulting in lateral recess stenosis with L4 nerve root impingement as well as moderate right neural foraminal stenosis. Circumferential disc bulging and severe facet and ligamentum flavum hypertrophy contribute to mild spinal stenosis, mild left lateral recess stenosis, and mild left neural foraminal stenosis. Disc and facet degeneration have progressed from 2015, and the disc extrusion is new.  L5-S1: Disc desiccation and moderate disc space narrowing. Disc bulging, endplate spurring, disc space height loss, and mild facet hypertrophy result in mild right and moderate left neural foraminal stenosis without spinal stenosis, unchanged from 2015.  IMPRESSION: 1. New L4-5 disc extrusion with right L4 nerve root impingement in the lateral recess. 2. Stable chronic disc and facet degeneration at L5-S1 with mild right and moderate left neural foraminal stenosis.   Electronically Signed By: Logan Bores M.D. On: 02/13/2020 17:04       PMFS History:     Patient Active Problem List   Diagnosis Date Noted  . Herniated nucleus pulposus, lumbar 02/17/2020  . Syncope due to orthostatic hypotension 05/28/2019  . Hypertensive urgency 04/06/2019  . 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  . Wound infection 03/06/2019  . Localized swelling, mass and lump, multiple sites 03/27/2018  . Type II diabetes mellitus with renal manifestations ([place]) 12/27/2017  . HLD (hyperlipidemia) 12/27/2017  . HTN (hypertension) 12/27/2017  . CKD (chronic kidney disease), stage III 12/27/2017  . Syncope 12/27/2017  . Cough variant asthma 12/01/2015  . Osteoarthritis of left knee, primary localized 08/17/2014  . Knee osteoarthritis 08/17/2014  . Insulin pump in place  12/21/2013  . Rhabdomyolysis 11/15/2013  . Elevated lactic acid level 11/15/2013  . Hypoglycemia 11/15/2013  . Other and unspecified hyperlipidemia 08/06/2013  . Type II diabetes mellitus, uncontrolled ([place]) 07/20/2013  . Varicose veins of lower extremities with other complications 14/78/2956  . Fibromyalgia   . GERD 05/18/2010  . URINARY TRACT INFECTION, MONILIAL 05/05/2010  . UTI 05/02/2010  . ABDOMINAL PAIN, GENERALIZED 05/02/2010  . OBESITY 04/20/2010  . BURSITIS, LEFT SHOULDER 03/09/2010  . CANDIDIASIS OF VULVA AND VAGINA 01/10/2010  . Lipoma of arm s/p excision 01/29/2014 01/10/2010  . DEPRESSION 12/27/2009  . BACK PAIN WITH RADICULOPATHY 12/27/2009  . CYSTITIS, ACUTE 10/21/2009  . MICROSCOPIC HEMATURIA 10/18/2009  . KNEE PAIN, BILATERAL 10/18/2009  . NEUROPATHY 09/08/2009  . HYPERTENSION, BENIGN ESSENTIAL 09/08/2009  . Asthma 09/08/2009  . STRESS INCONTINENCE 09/08/2009  . CHEST PAIN 07/19/2009  . ESOPHAGEAL STRICTURE 08/27/2005  . Diabetes mellitus without complication ([place]) 21/30/8657       Past Medical History:  Diagnosis Date  . Arthritis    L knee, hands, back   . Asthma   . Diabetes mellitus type 2, insulin dependent ([place])   . Fibromyalgia   . Full dentures   . GERD (gastroesophageal reflux disease)   . H/O hiatal hernia   . Headache    h/o migraines - was followed for a time with a wellness doctor  . History of esophageal dilatation   . History of rhabdomyolysis    03/ 2015  . Hyperlipidemia   . Hypertension   . Insulin pump in place    since 12/ 2014--  [employer]  . Osteoarthritis of left knee, primary localized 08/17/2014  . Renal insufficiency   . SUI (stress urinary incontinence, female)   . Wears glasses          Family History  Problem Relation Age of Onset  . Diabetes Mother   . Hypertension Mother   .  Lung cancer Mother        smoked  . Diabetes Father   . Hypertension Father   . Lung cancer Father         smoked  . Diabetes Brother   . Heart failure Sister   . Diabetes Maternal Grandmother   . Cancer Maternal Grandmother          Past Surgical History:  Procedure Laterality Date  . BLADDER SUSPENSION N/A 03/09/2014   Procedure: CYSTOSCOPY/SLING;  Surgeon: Reece Packer, MD;  Location: [facility];  Service: Urology;  Laterality: N/A;  . CARDIAC CATHETERIZATION  07-20-2009   DR Shelva Majestic   MODERATE LVH/  NORMAL  LVEF/  NORMAL CORONARY AND RENAL ARTERIES  . CARPAL TUNNEL RELEASE Right 2000  . CHOLECYSTECTOMY  1990  . COLONOSCOPY WITH ESOPHAGOGASTRODUODENOSCOPY (EGD)  06-18-2002  . ESOPHAGOGASTRODUODENOSCOPY (EGD) WITH ESOPHAGEAL DILATION  06-12-2010  . EXCISION LEFT UPPER ARM MASS  01-29-2014  . EYE SURGERY  2010   laser on R eye  . INTERSTIM IMPLANT REMOVAL N/A 03/06/2019   Procedure: REMOVAL OF INTERSTIM IMPLANT;  Surgeon: Bjorn Loser, MD;  Location: [facility];  Service: Urology;  Laterality: N/A;  . KNEE ARTHROSCOPY Right 1989  &  2002  . NEGATIVE SLEEP STUDY  2012   PER PT  . PARTIAL KNEE ARTHROPLASTY Left 08/17/2014   Procedure: LEFT UNICOMPARTMENTAL KNEE;  Surgeon: Johnny Bridge, MD;  Location: [facility];  Service: Orthopedics;  Laterality: Left;   Social History       Occupational History  . Occupation: disability  Tobacco Use  . Smoking status: Never Smoker  . Smokeless tobacco: Never Used  Vaping Use  . Vaping Use: Never used  Substance and Sexual Activity  . Alcohol use: No  . Drug use: No  . Sexual activity: Not Currently

## 2020-02-19 ENCOUNTER — Observation Stay (HOSPITAL_COMMUNITY)
Admission: RE | Admit: 2020-02-19 | Discharge: 2020-02-20 | Disposition: A | Discharge status: Home / Self Care | Payer: Medicare Other | Attending: Orthopaedic Surgery | Admitting: Orthopaedic Surgery

## 2020-02-19 ENCOUNTER — Ambulatory Visit (HOSPITAL_COMMUNITY): Payer: Medicare Other | Admitting: Emergency Medicine

## 2020-02-19 ENCOUNTER — Ambulatory Visit (HOSPITAL_COMMUNITY): Payer: Medicare Other

## 2020-02-19 ENCOUNTER — Encounter (HOSPITAL_COMMUNITY): Payer: Self-pay | Admitting: Orthopaedic Surgery

## 2020-02-19 ENCOUNTER — Encounter (HOSPITAL_COMMUNITY)
Admission: RE | Disposition: A | Discharge status: Home / Self Care | Payer: Self-pay | Source: Home / Self Care | Attending: Orthopaedic Surgery

## 2020-02-19 ENCOUNTER — Telehealth: Payer: Self-pay

## 2020-02-19 ENCOUNTER — Other Ambulatory Visit: Payer: Self-pay

## 2020-02-19 DIAGNOSIS — Z419 Encounter for procedure for purposes other than remedying health state, unspecified: Secondary | ICD-10-CM

## 2020-02-19 DIAGNOSIS — M5126 Other intervertebral disc displacement, lumbar region: Secondary | ICD-10-CM | POA: Diagnosis not present

## 2020-02-19 DIAGNOSIS — M797 Fibromyalgia: Secondary | ICD-10-CM | POA: Diagnosis not present

## 2020-02-19 DIAGNOSIS — J45991 Cough variant asthma: Secondary | ICD-10-CM | POA: Insufficient documentation

## 2020-02-19 DIAGNOSIS — E1140 Type 2 diabetes mellitus with diabetic neuropathy, unspecified: Secondary | ICD-10-CM | POA: Insufficient documentation

## 2020-02-19 DIAGNOSIS — E785 Hyperlipidemia, unspecified: Secondary | ICD-10-CM | POA: Diagnosis not present

## 2020-02-19 DIAGNOSIS — K219 Gastro-esophageal reflux disease without esophagitis: Secondary | ICD-10-CM | POA: Insufficient documentation

## 2020-02-19 DIAGNOSIS — Z7951 Long term (current) use of inhaled steroids: Secondary | ICD-10-CM | POA: Insufficient documentation

## 2020-02-19 DIAGNOSIS — R112 Nausea with vomiting, unspecified: Secondary | ICD-10-CM | POA: Diagnosis not present

## 2020-02-19 DIAGNOSIS — Z794 Long term (current) use of insulin: Secondary | ICD-10-CM | POA: Diagnosis not present

## 2020-02-19 DIAGNOSIS — Z791 Long term (current) use of non-steroidal anti-inflammatories (NSAID): Secondary | ICD-10-CM | POA: Diagnosis not present

## 2020-02-19 DIAGNOSIS — E669 Obesity, unspecified: Secondary | ICD-10-CM | POA: Insufficient documentation

## 2020-02-19 DIAGNOSIS — Z9641 Presence of insulin pump (external) (internal): Secondary | ICD-10-CM | POA: Diagnosis not present

## 2020-02-19 DIAGNOSIS — M5127 Other intervertebral disc displacement, lumbosacral region: Secondary | ICD-10-CM | POA: Diagnosis not present

## 2020-02-19 DIAGNOSIS — Z6826 Body mass index (BMI) 26.0-26.9, adult: Secondary | ICD-10-CM | POA: Diagnosis not present

## 2020-02-19 DIAGNOSIS — Z7952 Long term (current) use of systemic steroids: Secondary | ICD-10-CM | POA: Insufficient documentation

## 2020-02-19 DIAGNOSIS — E1122 Type 2 diabetes mellitus with diabetic chronic kidney disease: Secondary | ICD-10-CM | POA: Insufficient documentation

## 2020-02-19 DIAGNOSIS — I129 Hypertensive chronic kidney disease with stage 1 through stage 4 chronic kidney disease, or unspecified chronic kidney disease: Secondary | ICD-10-CM | POA: Diagnosis not present

## 2020-02-19 DIAGNOSIS — N183 Chronic kidney disease, stage 3 unspecified: Secondary | ICD-10-CM | POA: Insufficient documentation

## 2020-02-19 DIAGNOSIS — Z01818 Encounter for other preprocedural examination: Secondary | ICD-10-CM

## 2020-02-19 DIAGNOSIS — M5116 Intervertebral disc disorders with radiculopathy, lumbar region: Principal | ICD-10-CM | POA: Insufficient documentation

## 2020-02-19 DIAGNOSIS — M47816 Spondylosis without myelopathy or radiculopathy, lumbar region: Secondary | ICD-10-CM | POA: Diagnosis not present

## 2020-02-19 DIAGNOSIS — Z79899 Other long term (current) drug therapy: Secondary | ICD-10-CM | POA: Insufficient documentation

## 2020-02-19 HISTORY — DX: Other intervertebral disc displacement, lumbar region: M51.26

## 2020-02-19 HISTORY — PX: LUMBAR LAMINECTOMY: SHX95

## 2020-02-19 HISTORY — DX: Nonrheumatic aortic (valve) stenosis: I35.0

## 2020-02-19 LAB — COMPREHENSIVE METABOLIC PANEL
ALT: 19 U/L (ref 0–44)
AST: 17 U/L (ref 15–41)
Albumin: 3.5 g/dL (ref 3.5–5.0)
Alkaline Phosphatase: 94 U/L (ref 38–126)
Anion gap: 15 (ref 5–15)
BUN: 23 mg/dL (ref 8–23)
CO2: 25 mmol/L (ref 22–32)
Calcium: 9.1 mg/dL (ref 8.9–10.3)
Chloride: 97 mmol/L — ABNORMAL LOW (ref 98–111)
Creatinine, Ser: 1.21 mg/dL — ABNORMAL HIGH (ref 0.44–1.00)
GFR calc Af Amer: 54 mL/min — ABNORMAL LOW (ref >60)
GFR calc non Af Amer: 46 mL/min — ABNORMAL LOW (ref >60)
Glucose, Bld: 275 mg/dL — ABNORMAL HIGH (ref 70–99)
Potassium: 3.8 mmol/L (ref 3.5–5.1)
Sodium: 137 mmol/L (ref 135–145)
Total Bilirubin: 1.5 mg/dL — ABNORMAL HIGH (ref 0.3–1.2)
Total Protein: 7.1 g/dL (ref 6.5–8.1)

## 2020-02-19 LAB — GLUCOSE, CAPILLARY
Glucose-Capillary: 254 mg/dL — ABNORMAL HIGH (ref 70–99)
Glucose-Capillary: 178 mg/dL — ABNORMAL HIGH (ref 70–99)
Glucose-Capillary: 61 mg/dL — ABNORMAL LOW (ref 70–99)
Glucose-Capillary: 117 mg/dL — ABNORMAL HIGH (ref 70–99)
Glucose-Capillary: 144 mg/dL — ABNORMAL HIGH (ref 70–99)
Glucose-Capillary: 210 mg/dL — ABNORMAL HIGH (ref 70–99)

## 2020-02-19 LAB — URINALYSIS, ROUTINE W REFLEX MICROSCOPIC
Bilirubin Urine: NEGATIVE
Glucose, UA: >=500 mg/dL — AB
Ketones, ur: 20 mg/dL — AB
Nitrite: NEGATIVE
Protein, ur: >=300 mg/dL — AB
Specific Gravity, Urine: 1.028 (ref 1.005–1.030)
pH: 5 (ref 5.0–8.0)

## 2020-02-19 LAB — CBC
HCT: 48.5 % — ABNORMAL HIGH (ref 36.0–46.0)
Hemoglobin: 15.5 g/dL — ABNORMAL HIGH (ref 12.0–15.0)
MCH: 29.4 pg (ref 26.0–34.0)
MCHC: 32 g/dL (ref 30.0–36.0)
MCV: 91.9 fL (ref 80.0–100.0)
Platelets: 255 10*3/uL (ref 150–400)
RBC: 5.28 MIL/uL — ABNORMAL HIGH (ref 3.87–5.11)
RDW: 11.9 % (ref 11.5–15.5)
WBC: 8.9 10*3/uL (ref 4.0–10.5)
nRBC: 0 % (ref 0.0–0.2)

## 2020-02-19 LAB — SURGICAL PCR SCREEN
MRSA, PCR: NEGATIVE
Staphylococcus aureus: NEGATIVE

## 2020-02-19 SURGERY — MICRODISCECTOMY LUMBAR LAMINECTOMY
Anesthesia: General | Site: Spine Lumbar

## 2020-02-19 MED ORDER — MIDAZOLAM HCL 2 MG/2ML IJ SOLN
INTRAMUSCULAR | Status: DC | PRN
  Administered 2020-02-19: 1 mg via INTRAVENOUS

## 2020-02-19 MED ORDER — MOMETASONE FURO-FORMOTEROL FUM 100-5 MCG/ACT IN AERO
2.0000 | INHALATION_SPRAY | RESPIRATORY_TRACT | Status: DC | PRN
  Filled 2020-02-19: qty 8.8

## 2020-02-19 MED ORDER — ADULT MULTIVITAMIN W/MINERALS CH: 1.0000 | ORAL_TABLET | Freq: Every day | ORAL | Status: DC

## 2020-02-19 MED ORDER — PROPOFOL 10 MG/ML IV BOLUS
INTRAVENOUS | Status: AC
  Filled 2020-02-19: qty 20

## 2020-02-19 MED ORDER — OXYCODONE-ACETAMINOPHEN 5-325 MG PO TABS: 1.0000 | ORAL_TABLET | Freq: Four times a day (QID) | ORAL | 0 refills | Status: DC | PRN

## 2020-02-19 MED ORDER — ORAL CARE MOUTH RINSE: 15.0000 mL | Freq: Once | OROMUCOSAL | Status: AC

## 2020-02-19 MED ORDER — LIDOCAINE 2% (20 MG/ML) 5 ML SYRINGE
INTRAMUSCULAR | Status: AC
  Filled 2020-02-19: qty 5

## 2020-02-19 MED ORDER — PHENYLEPHRINE 40 MCG/ML (10ML) SYRINGE FOR IV PUSH (FOR BLOOD PRESSURE SUPPORT)
PREFILLED_SYRINGE | INTRAVENOUS | Status: AC
  Filled 2020-02-19: qty 10

## 2020-02-19 MED ORDER — ROCURONIUM BROMIDE 10 MG/ML (PF) SYRINGE
PREFILLED_SYRINGE | INTRAVENOUS | Status: AC
  Filled 2020-02-19: qty 10

## 2020-02-19 MED ORDER — PHENYLEPHRINE 40 MCG/ML (10ML) SYRINGE FOR IV PUSH (FOR BLOOD PRESSURE SUPPORT)
PREFILLED_SYRINGE | INTRAVENOUS | Status: DC | PRN
  Administered 2020-02-19: 80 ug via INTRAVENOUS
  Administered 2020-02-19: 40 ug via INTRAVENOUS
  Administered 2020-02-19: 80 ug via INTRAVENOUS
  Administered 2020-02-19 (×2): 40 ug via INTRAVENOUS
  Administered 2020-02-19: 80 ug via INTRAVENOUS
  Administered 2020-02-19: 40 ug via INTRAVENOUS

## 2020-02-19 MED ORDER — FENTANYL CITRATE (PF) 250 MCG/5ML IJ SOLN
INTRAMUSCULAR | Status: AC
  Filled 2020-02-19: qty 5

## 2020-02-19 MED ORDER — INSULIN ASPART 100 UNIT/ML IV SOLN
8.0000 [IU] | INTRAVENOUS | Status: AC
  Filled 2020-02-19: qty 0.08

## 2020-02-19 MED ORDER — METHOCARBAMOL 1000 MG/10ML IJ SOLN
500.0000 mg | Freq: Four times a day (QID) | INTRAVENOUS | Status: DC | PRN
  Filled 2020-02-19: qty 5

## 2020-02-19 MED ORDER — LUBIPROSTONE 8 MCG PO CAPS
8.0000 ug | ORAL_CAPSULE | Freq: Every day | ORAL | Status: DC
  Administered 2020-02-20: 8 ug via ORAL
  Filled 2020-02-19: qty 1

## 2020-02-19 MED ORDER — POTASSIUM CHLORIDE IN NACL 20-0.45 MEQ/L-% IV SOLN: INTRAVENOUS | Status: DC

## 2020-02-19 MED ORDER — SODIUM CHLORIDE 0.9 % IV SOLN: 250.0000 mL | INTRAVENOUS | Status: DC

## 2020-02-19 MED ORDER — FENTANYL CITRATE (PF) 100 MCG/2ML IJ SOLN
INTRAMUSCULAR | Status: AC
  Filled 2020-02-19: qty 2

## 2020-02-19 MED ORDER — DEXAMETHASONE SODIUM PHOSPHATE 10 MG/ML IJ SOLN
INTRAMUSCULAR | Status: DC | PRN
  Administered 2020-02-19: 5 mg via INTRAVENOUS

## 2020-02-19 MED ORDER — AMLODIPINE BESYLATE 5 MG PO TABS
10.0000 mg | ORAL_TABLET | Freq: Every evening | ORAL | Status: DC
  Administered 2020-02-19: 10 mg via ORAL
  Filled 2020-02-19: qty 2

## 2020-02-19 MED ORDER — MENTHOL 3 MG MT LOZG: 1.0000 | LOZENGE | OROMUCOSAL | Status: DC | PRN

## 2020-02-19 MED ORDER — DAPAGLIFLOZIN PROPANEDIOL 5 MG PO TABS
5.0000 mg | ORAL_TABLET | Freq: Every day | ORAL | Status: DC
  Administered 2020-02-20: 5 mg via ORAL
  Filled 2020-02-19: qty 1

## 2020-02-19 MED ORDER — FENTANYL CITRATE (PF) 250 MCG/5ML IJ SOLN
INTRAMUSCULAR | Status: DC | PRN
  Administered 2020-02-19: 25 ug via INTRAVENOUS
  Administered 2020-02-19: 75 ug via INTRAVENOUS

## 2020-02-19 MED ORDER — BUPIVACAINE HCL (PF) 0.25 % IJ SOLN
INTRAMUSCULAR | Status: DC | PRN
  Administered 2020-02-19: 6 mL

## 2020-02-19 MED ORDER — SODIUM CHLORIDE 0.9% FLUSH
3.0000 mL | Freq: Two times a day (BID) | INTRAVENOUS | Status: DC
Start: 2020-02-19 — End: 2020-02-19

## 2020-02-19 MED ORDER — ACETAMINOPHEN 500 MG PO TABS
ORAL_TABLET | ORAL | Status: AC
  Administered 2020-02-19: 1000 mg via ORAL
  Filled 2020-02-19: qty 2

## 2020-02-19 MED ORDER — OXYCODONE HCL 5 MG PO TABS
5.0000 mg | ORAL_TABLET | ORAL | Status: DC | PRN
  Administered 2020-02-20: 5 mg via ORAL
  Filled 2020-02-19: qty 1

## 2020-02-19 MED ORDER — FREESTYLE LIBRE READER DEVI: 1.0000 | Status: DC

## 2020-02-19 MED ORDER — LACTATED RINGERS IV SOLN: INTRAVENOUS | Status: DC

## 2020-02-19 MED ORDER — INSULIN ASPART 100 UNIT/ML ~~LOC~~ SOLN
SUBCUTANEOUS | Status: AC
  Administered 2020-02-19: 8 [IU] via SUBCUTANEOUS
  Filled 2020-02-19: qty 1

## 2020-02-19 MED ORDER — OXYCODONE HCL 5 MG PO TABS
10.0000 mg | ORAL_TABLET | ORAL | Status: DC | PRN
  Administered 2020-02-19 – 2020-02-20 (×4): 10 mg via ORAL
  Filled 2020-02-19 (×4): qty 2

## 2020-02-19 MED ORDER — VITAMIN D (ERGOCALCIFEROL) 1.25 MG (50000 UNIT) PO CAPS: 50000.0000 [IU] | ORAL_CAPSULE | ORAL | Status: DC

## 2020-02-19 MED ORDER — NEBIVOLOL HCL 20 MG PO TABS: 20.0000 mg | ORAL_TABLET | Freq: Every day | ORAL | Status: DC

## 2020-02-19 MED ORDER — SODIUM CHLORIDE 0.45 % IV SOLN: INTRAVENOUS | Status: DC

## 2020-02-19 MED ORDER — ONDANSETRON HCL 4 MG/2ML IJ SOLN
INTRAMUSCULAR | Status: AC
  Filled 2020-02-19: qty 2

## 2020-02-19 MED ORDER — ALBUTEROL SULFATE (2.5 MG/3ML) 0.083% IN NEBU: 2.5000 mg | INHALATION_SOLUTION | RESPIRATORY_TRACT | Status: DC | PRN

## 2020-02-19 MED ORDER — ROCURONIUM BROMIDE 10 MG/ML (PF) SYRINGE
PREFILLED_SYRINGE | INTRAVENOUS | Status: DC | PRN
  Administered 2020-02-19: 50 mg via INTRAVENOUS

## 2020-02-19 MED ORDER — CHLORHEXIDINE GLUCONATE 0.12 % MT SOLN
OROMUCOSAL | Status: AC
  Administered 2020-02-19: 15 mL via OROMUCOSAL
  Filled 2020-02-19: qty 15

## 2020-02-19 MED ORDER — MIRABEGRON ER 50 MG PO TB24
50.0000 mg | ORAL_TABLET | Freq: Every day | ORAL | Status: DC
  Administered 2020-02-19 – 2020-02-20 (×2): 50 mg via ORAL
  Filled 2020-02-19 (×2): qty 1

## 2020-02-19 MED ORDER — LANCETS MISC. MISC: 1.0000 | Freq: Three times a day (TID) | Status: DC

## 2020-02-19 MED ORDER — SODIUM CHLORIDE 0.9 % IR SOLN
Status: DC | PRN
  Administered 2020-02-19: 1000 mL

## 2020-02-19 MED ORDER — FREESTYLE LIBRE 14 DAY SENSOR MISC: 1.0000 | Status: DC

## 2020-02-19 MED ORDER — SODIUM CHLORIDE 0.9% FLUSH: 3.0000 mL | INTRAVENOUS | Status: DC | PRN

## 2020-02-19 MED ORDER — ONDANSETRON HCL 4 MG PO TABS: 4.0000 mg | ORAL_TABLET | Freq: Four times a day (QID) | ORAL | Status: DC | PRN

## 2020-02-19 MED ORDER — OXYCODONE HCL 5 MG PO TABS: 5.0000 mg | ORAL_TABLET | Freq: Once | ORAL | Status: DC | PRN

## 2020-02-19 MED ORDER — MORPHINE SULFATE (PF) 2 MG/ML IV SOLN: 2.0000 mg | INTRAVENOUS | Status: DC | PRN

## 2020-02-19 MED ORDER — CEFAZOLIN SODIUM-DEXTROSE 2-3 GM-%(50ML) IV SOLR
INTRAVENOUS | Status: DC | PRN
  Administered 2020-02-19: 2 g via INTRAVENOUS

## 2020-02-19 MED ORDER — INSULIN ASPART 100 UNIT/ML ~~LOC~~ SOLN: 8.0000 [IU] | SUBCUTANEOUS | Status: DC

## 2020-02-19 MED ORDER — LOSARTAN POTASSIUM 50 MG PO TABS: 50.0000 mg | ORAL_TABLET | Freq: Every day | ORAL | Status: DC

## 2020-02-19 MED ORDER — FENTANYL CITRATE (PF) 100 MCG/2ML IJ SOLN: 50.0000 ug | INTRAMUSCULAR | Status: AC

## 2020-02-19 MED ORDER — FENTANYL CITRATE (PF) 100 MCG/2ML IJ SOLN
25.0000 ug | INTRAMUSCULAR | Status: DC | PRN
  Administered 2020-02-19 (×4): 25 ug via INTRAVENOUS

## 2020-02-19 MED ORDER — ONDANSETRON HCL 4 MG/2ML IJ SOLN: 4.0000 mg | Freq: Four times a day (QID) | INTRAMUSCULAR | Status: DC | PRN

## 2020-02-19 MED ORDER — GABAPENTIN 300 MG PO CAPS
300.0000 mg | ORAL_CAPSULE | Freq: Three times a day (TID) | ORAL | Status: DC
  Administered 2020-02-19 – 2020-02-20 (×3): 300 mg via ORAL
  Filled 2020-02-19 (×3): qty 1

## 2020-02-19 MED ORDER — ACETAMINOPHEN 500 MG PO TABS: 1000.0000 mg | ORAL_TABLET | Freq: Once | ORAL | Status: AC

## 2020-02-19 MED ORDER — PROMETHAZINE HCL 25 MG/ML IJ SOLN: 6.2500 mg | INTRAMUSCULAR | Status: DC | PRN

## 2020-02-19 MED ORDER — BUPIVACAINE-EPINEPHRINE (PF) 0.75% -1:200000 IJ SOLN: 2.0000 mL | Freq: Once | INTRAMUSCULAR | Status: DC

## 2020-02-19 MED ORDER — SUGAMMADEX SODIUM 200 MG/2ML IV SOLN
INTRAVENOUS | Status: DC | PRN
  Administered 2020-02-19: 200 mg via INTRAVENOUS

## 2020-02-19 MED ORDER — CEFAZOLIN SODIUM-DEXTROSE 1-4 GM/50ML-% IV SOLN
1.0000 g | Freq: Three times a day (TID) | INTRAVENOUS | Status: AC
  Administered 2020-02-19 – 2020-02-20 (×2): 1 g via INTRAVENOUS
  Filled 2020-02-19 (×2): qty 50

## 2020-02-19 MED ORDER — DEXAMETHASONE SODIUM PHOSPHATE 10 MG/ML IJ SOLN
INTRAMUSCULAR | Status: AC
  Filled 2020-02-19: qty 1

## 2020-02-19 MED ORDER — OXYCODONE HCL 5 MG/5ML PO SOLN: 5.0000 mg | Freq: Once | ORAL | Status: DC | PRN

## 2020-02-19 MED ORDER — DEXTROSE 50 % IV SOLN
INTRAVENOUS | Status: AC
  Filled 2020-02-19: qty 50

## 2020-02-19 MED ORDER — INSULIN ASPART 100 UNIT/ML ~~LOC~~ SOLN
0.0000 [IU] | Freq: Three times a day (TID) | SUBCUTANEOUS | Status: DC
  Administered 2020-02-19: 2 [IU] via SUBCUTANEOUS
  Administered 2020-02-20: 5 [IU] via SUBCUTANEOUS

## 2020-02-19 MED ORDER — MIDAZOLAM HCL 2 MG/2ML IJ SOLN
INTRAMUSCULAR | Status: AC
  Filled 2020-02-19: qty 2

## 2020-02-19 MED ORDER — DOCUSATE SODIUM 100 MG PO CAPS
100.0000 mg | ORAL_CAPSULE | Freq: Every day | ORAL | Status: DC | PRN
  Administered 2020-02-19: 100 mg via ORAL

## 2020-02-19 MED ORDER — MUPIROCIN 2 % EX OINT
1.0000 "application " | TOPICAL_OINTMENT | Freq: Two times a day (BID) | CUTANEOUS | Status: DC
  Administered 2020-02-19 – 2020-02-20 (×2): 1 via NASAL
  Filled 2020-02-19: qty 22

## 2020-02-19 MED ORDER — INSULIN ASPART 100 UNIT/ML IV SOLN: 10.0000 [IU] | INTRAVENOUS | Status: DC

## 2020-02-19 MED ORDER — PANTOPRAZOLE SODIUM 40 MG PO TBEC
40.0000 mg | DELAYED_RELEASE_TABLET | Freq: Every evening | ORAL | Status: DC
  Administered 2020-02-19: 40 mg via ORAL
  Filled 2020-02-19: qty 1

## 2020-02-19 MED ORDER — ONDANSETRON HCL 4 MG/2ML IJ SOLN
INTRAMUSCULAR | Status: DC | PRN
  Administered 2020-02-19: 4 mg via INTRAVENOUS

## 2020-02-19 MED ORDER — DEXTROSE 50 % IV SOLN
25.0000 mL | Freq: Once | INTRAVENOUS | Status: AC
  Administered 2020-02-19: 25 mL via INTRAVENOUS

## 2020-02-19 MED ORDER — PRAVASTATIN SODIUM 40 MG PO TABS
40.0000 mg | ORAL_TABLET | Freq: Every day | ORAL | Status: DC
  Administered 2020-02-19 – 2020-02-20 (×2): 40 mg via ORAL
  Filled 2020-02-19 (×2): qty 1

## 2020-02-19 MED ORDER — PROPOFOL 10 MG/ML IV BOLUS
INTRAVENOUS | Status: DC | PRN
  Administered 2020-02-19: 30 mg via INTRAVENOUS
  Administered 2020-02-19: 90 mg via INTRAVENOUS

## 2020-02-19 MED ORDER — ESMOLOL HCL 100 MG/10ML IV SOLN
INTRAVENOUS | Status: DC | PRN
Start: 2020-02-19 — End: 2020-02-19
  Administered 2020-02-19: 40 mg via INTRAVENOUS
  Administered 2020-02-19 (×2): 20 mg via INTRAVENOUS

## 2020-02-19 MED ORDER — SPIRONOLACTONE 25 MG PO TABS
50.0000 mg | ORAL_TABLET | Freq: Every day | ORAL | Status: DC
  Administered 2020-02-19 – 2020-02-20 (×2): 50 mg via ORAL
  Filled 2020-02-19 (×2): qty 2

## 2020-02-19 MED ORDER — SEMAGLUTIDE (1 MG/DOSE) 2 MG/1.5ML ~~LOC~~ SOPN: 1.0000 mg | PEN_INJECTOR | SUBCUTANEOUS | Status: DC

## 2020-02-19 MED ORDER — METHOCARBAMOL 500 MG PO TABS
500.0000 mg | ORAL_TABLET | Freq: Four times a day (QID) | ORAL | Status: DC | PRN
  Administered 2020-02-19 – 2020-02-20 (×3): 500 mg via ORAL
  Filled 2020-02-19 (×3): qty 1

## 2020-02-19 MED ORDER — PHENOL 1.4 % MT LIQD: 1.0000 | OROMUCOSAL | Status: DC | PRN

## 2020-02-19 MED ORDER — LIDOCAINE 2% (20 MG/ML) 5 ML SYRINGE
INTRAMUSCULAR | Status: DC | PRN
  Administered 2020-02-19: 40 mg via INTRAVENOUS

## 2020-02-19 MED ORDER — INSULIN GLARGINE 100 UNIT/ML ~~LOC~~ SOLN
10.0000 [IU] | Freq: Every day | SUBCUTANEOUS | Status: DC
  Administered 2020-02-19: 10 [IU] via SUBCUTANEOUS
  Filled 2020-02-19 (×2): qty 0.1

## 2020-02-19 MED ORDER — CELECOXIB 200 MG PO CAPS
200.0000 mg | ORAL_CAPSULE | Freq: Two times a day (BID) | ORAL | Status: DC
  Administered 2020-02-19 – 2020-02-20 (×2): 200 mg via ORAL
  Filled 2020-02-19 (×2): qty 1

## 2020-02-19 MED ORDER — CHLORHEXIDINE GLUCONATE 0.12 % MT SOLN: 15.0000 mL | Freq: Once | OROMUCOSAL | Status: AC

## 2020-02-19 MED ORDER — DOCUSATE SODIUM 100 MG PO CAPS
100.0000 mg | ORAL_CAPSULE | Freq: Two times a day (BID) | ORAL | Status: DC
  Administered 2020-02-19 – 2020-02-20 (×2): 100 mg via ORAL
  Filled 2020-02-19 (×2): qty 1

## 2020-02-19 MED ORDER — FENTANYL CITRATE (PF) 100 MCG/2ML IJ SOLN
INTRAMUSCULAR | Status: AC
  Administered 2020-02-19: 50 ug via INTRAVENOUS
  Filled 2020-02-19: qty 2

## 2020-02-19 SURGICAL SUPPLY — 36 items
AGENT HMST KT MTR STRL THRMB (HEMOSTASIS) ×1
BUR ROUND FLUTED 4 SOFT TCH (BURR) IMPLANT
BUR ROUND FLUTED 4MM SOFT TCH (BURR)
CANISTER SUCT 3000ML PPV (MISCELLANEOUS) ×3 IMPLANT
COVER SURGICAL LIGHT HANDLE (MISCELLANEOUS) ×3 IMPLANT
DRAPE INCISE IOBAN 66X45 STRL (DRAPES) ×2 IMPLANT
DRAPE MICROSCOPE LEICA (MISCELLANEOUS) ×3 IMPLANT
DRSG EMULSION OIL 3X3 NADH (GAUZE/BANDAGES/DRESSINGS) ×2 IMPLANT
DURAPREP 26ML APPLICATOR (WOUND CARE) ×3 IMPLANT
ELECT REM PT RETURN 9FT ADLT (ELECTROSURGICAL) ×3
ELECTRODE REM PT RTRN 9FT ADLT (ELECTROSURGICAL) ×1 IMPLANT
GAUZE SPONGE 4X4 12PLY STRL (GAUZE/BANDAGES/DRESSINGS) ×2 IMPLANT
GLOVE BIOGEL PI IND STRL 8 (GLOVE) ×2 IMPLANT
GLOVE BIOGEL PI INDICATOR 8 (GLOVE) ×4
GLOVE ORTHO TXT STRL SZ7.5 (GLOVE) ×6 IMPLANT
GOWN STRL REUS W/ TWL LRG LVL3 (GOWN DISPOSABLE) ×1 IMPLANT
GOWN STRL REUS W/ TWL XL LVL3 (GOWN DISPOSABLE) ×1 IMPLANT
GOWN STRL REUS W/TWL 2XL LVL3 (GOWN DISPOSABLE) ×3 IMPLANT
GOWN STRL REUS W/TWL LRG LVL3 (GOWN DISPOSABLE) ×3
GOWN STRL REUS W/TWL XL LVL3 (GOWN DISPOSABLE) ×3
KIT BASIN OR (CUSTOM PROCEDURE TRAY) ×3 IMPLANT
KIT TURNOVER KIT B (KITS) ×3 IMPLANT
NDL SPNL 18GX3.5 QUINCKE PK (NEEDLE) ×1 IMPLANT
NEEDLE SPNL 18GX3.5 QUINCKE PK (NEEDLE) ×3 IMPLANT
NS IRRIG 1000ML POUR BTL (IV SOLUTION) ×3 IMPLANT
PACK LAMINECTOMY ORTHO (CUSTOM PROCEDURE TRAY) ×3 IMPLANT
SURGIFLO W/THROMBIN 8M KIT (HEMOSTASIS) ×2 IMPLANT
SUT VIC AB 1 CTX 36 (SUTURE) ×3
SUT VIC AB 1 CTX36XBRD ANBCTR (SUTURE) ×1 IMPLANT
SUT VIC AB 2-0 CT1 27 (SUTURE) ×3
SUT VIC AB 2-0 CT1 TAPERPNT 27 (SUTURE) ×1 IMPLANT
SUT VIC AB 3-0 X1 27 (SUTURE) IMPLANT
SYR 20ML ECCENTRIC (SYRINGE) IMPLANT
TAPE PAPER 2X10 WHT MICROPORE (GAUZE/BANDAGES/DRESSINGS) ×2 IMPLANT
TOWEL GREEN STERILE (TOWEL DISPOSABLE) ×3 IMPLANT
TOWEL GREEN STERILE FF (TOWEL DISPOSABLE) ×3 IMPLANT

## 2020-02-19 NOTE — Transfer of Care (Signed)
Immediate Anesthesia Transfer of Care Note  Patient: [name]  Procedure(s) Performed: right L4 hemiaminectomy, microdiscectomy (N/A Spine Lumbar)  Patient Location: PACU  Anesthesia Type:General  Level of Consciousness: drowsy, patient cooperative and responds to stimulation  Airway & Oxygen Therapy: Patient Spontanous Breathing and Patient connected to face mask oxygen  Post-op Assessment: Report given to RN, Post -op Vital signs reviewed and stable and Patient moving all extremities  Post vital signs: Reviewed and stable  Last Vitals:  Vitals Value Taken Time  BP 186/83 02/19/20 1313  Temp    Pulse 77 02/19/20 1314  Resp 14 02/19/20 1314  SpO2 100 % 02/19/20 1314  Vitals shown include unvalidated device data.  Last Pain:  Vitals:   02/19/20 0957  TempSrc:   PainSc: 6       Patients Stated Pain Goal: 3 (50/15/86 8257)  Complications: No complications documented.

## 2020-02-19 NOTE — Discharge Instructions (Signed)
Walk daily.  Your dressing will be waterproof and you can shower with it on and it does not need to be removed.  See Dr. Lorin Mercy in 1 week.  You may be expected to have some pain in the right leg off and on after surgery which occurs sometimes.  Avoid bending lifting stooping and twisting.  Okay for sitting while you eat and going to the bathroom otherwise he will do better if you are laying down on your side or your back or else upright and walking.    Call Your Doctor If Any of These Occur Redness, drainage, or swelling at the wound.  Temperature greater than 101 degrees. Severe pain not relieved by pain medication. Incision starts to come apart.

## 2020-02-19 NOTE — Anesthesia Postprocedure Evaluation (Signed)
Anesthesia Post Note  Patient: [name]  Procedure(s) Performed: right L4 hemiaminectomy, microdiscectomy (N/A Spine Lumbar)     Patient location during evaluation: PACU Anesthesia Type: General Level of consciousness: awake and alert and oriented Pain management: pain level controlled Vital Signs Assessment: post-procedure vital signs reviewed and stable Respiratory status: spontaneous breathing, nonlabored ventilation and respiratory function stable Cardiovascular status: blood pressure returned to baseline Postop Assessment: no apparent nausea or vomiting Anesthetic complications: no   No complications documented.  Last Vitals:  Vitals:   02/19/20 1325 02/19/20 1340  BP: (!) 187/80 (!) 173/89  Pulse: 71 75  Resp: 13 13  Temp:    SpO2: 100% 100%    Last Pain:  Vitals:   02/19/20 1400  TempSrc:   PainSc: [place]

## 2020-02-19 NOTE — Op Note (Signed)
Preop diagnosis: Right L4-5 herniated nucleus pulposus with free fragment and acute radiculopathy.  Postop diagnosis: Same  Procedure: Right L4-5 microdiscectomy hemilaminectomy and removal of free fragment.  Surgeon: Rodell Perna, MD  Assistant: Larkin Ina RNFA.  EBL: 100 cc  Findings: Migrated HNP cephalad at the level of the pedicle with acute right L4 nerve root compression.  Procedure: After induction general anesthesia orotracheal ovation preoperative Ancef prophylaxis standard prepping and draping was performed careful padding the ulnar nerve and shoulders. Back was prepped with DuraPrep area squared with towels Betadine Steri-Drape after sterile skin marker and laminectomy sheet and draped. Needle localization spinal needle. Timeout procedure was completed. Incision was made few millimeters to the right of midline subperiosteal dissection out to the lamina and placement of a Taylor retractor laterally. Laminotomy was performed with the bur and 3 mm Kerrison and a Penfield four was placed in the interlaminar space between L4 and L5 confirmed with radiograph. Patient had two and half weeks of severe acute pain inability to ambulate and walk and has been writhing in pain with the migrated fragment from L4-5 on the right up behind the L4 nerve root at the level of pedicle almost up to the L3-4 disc. Complete hemilaminectomy was performed and fragment was found going over the top of the L4 nerve root 2 mm below the level of the L3-4 disc where the fragment had migrated was teased out with black nerve hook and ball-tipped nerve hook and removed with micropituitary. Operative microscope was used for visualization. In the axilla the nerve root ball-tipped nerve probe as well as hockey-stick was used anterior to the nerve root to make sure there is no remaining fragments one additional piece was teased out in the hockey-stick could be swept 180 degrees anterior to the nerve root and midline at the dura  without areas compression. Down below the nerve root passes were made through the L4-5 disc space moving some additional chunks that were underneath the ligament. At the level of the disc laterally in the right side there was no areas compression. Copious irrigation was performed. Some Surgi-Flo was used in the gutter and bipolar cautery for some epidural veins. Operative field was dry. Final look to make sure no remaining fragments were present nerve root was free. Closure with #1 Vicryl in the fascia 2-0 Vicryl subtenons tissue skin staple closure postop dressing. In the recovery room patient had relief of severe preop leg pain.

## 2020-02-19 NOTE — Interval H&P Note (Signed)
History and Physical Interval Note:  02/19/2020 10:54 AM  [name]  has presented today for surgery, with the diagnosis of right L4-5 herniated nucleus pulposus.  The various methods of treatment have been discussed with the patient and family. After consideration of risks, benefits and other options for treatment, the patient has consented to  Procedure(s): right L4 hemiaminectomy, microdiscectomy (N/A) as a surgical intervention.  The patient's history has been reviewed, patient examined, no change in status, stable for surgery.  I have reviewed the patient's chart and labs.  Questions were answered to the patient's satisfaction.     Marybelle Killings

## 2020-02-19 NOTE — Anesthesia Procedure Notes (Signed)
Procedure Name: Intubation Date/Time: 02/19/2020 11:22 AM Performed by: Michele Rockers, CRNA Pre-anesthesia Checklist: Patient identified, Emergency Drugs available, Suction available and Patient being monitored Patient Re-evaluated:Patient Re-evaluated prior to induction Oxygen Delivery Method: Circle system utilized Preoxygenation: Pre-oxygenation with 100% oxygen Induction Type: IV induction Ventilation: Mask ventilation without difficulty Laryngoscope Size: Mac and 3 Grade View: Grade I Tube type: Oral Tube size: 7.0 mm Number of attempts: 1 Airway Equipment and Method: Stylet and Oral airway Placement Confirmation: ETT inserted through vocal cords under direct vision,  positive ETCO2 and breath sounds checked- equal and bilateral Secured at: 20 cm Tube secured with: Tape Dental Injury: Teeth and Oropharynx as per pre-operative assessment

## 2020-02-20 ENCOUNTER — Encounter (HOSPITAL_COMMUNITY): Payer: Self-pay | Admitting: Orthopaedic Surgery

## 2020-02-20 DIAGNOSIS — E1122 Type 2 diabetes mellitus with diabetic chronic kidney disease: Secondary | ICD-10-CM | POA: Diagnosis not present

## 2020-02-20 DIAGNOSIS — M5116 Intervertebral disc disorders with radiculopathy, lumbar region: Secondary | ICD-10-CM | POA: Diagnosis not present

## 2020-02-20 DIAGNOSIS — Z794 Long term (current) use of insulin: Secondary | ICD-10-CM | POA: Diagnosis not present

## 2020-02-20 DIAGNOSIS — Z7951 Long term (current) use of inhaled steroids: Secondary | ICD-10-CM | POA: Diagnosis not present

## 2020-02-20 DIAGNOSIS — E1140 Type 2 diabetes mellitus with diabetic neuropathy, unspecified: Secondary | ICD-10-CM | POA: Diagnosis not present

## 2020-02-20 DIAGNOSIS — E785 Hyperlipidemia, unspecified: Secondary | ICD-10-CM | POA: Diagnosis not present

## 2020-02-20 DIAGNOSIS — Z7952 Long term (current) use of systemic steroids: Secondary | ICD-10-CM | POA: Diagnosis not present

## 2020-02-20 DIAGNOSIS — Z791 Long term (current) use of non-steroidal anti-inflammatories (NSAID): Secondary | ICD-10-CM | POA: Diagnosis not present

## 2020-02-20 DIAGNOSIS — M797 Fibromyalgia: Secondary | ICD-10-CM | POA: Diagnosis not present

## 2020-02-20 DIAGNOSIS — Z9641 Presence of insulin pump (external) (internal): Secondary | ICD-10-CM | POA: Diagnosis not present

## 2020-02-20 DIAGNOSIS — Z79899 Other long term (current) drug therapy: Secondary | ICD-10-CM | POA: Diagnosis not present

## 2020-02-20 DIAGNOSIS — R112 Nausea with vomiting, unspecified: Secondary | ICD-10-CM | POA: Diagnosis not present

## 2020-02-20 DIAGNOSIS — K219 Gastro-esophageal reflux disease without esophagitis: Secondary | ICD-10-CM | POA: Diagnosis not present

## 2020-02-20 DIAGNOSIS — J45991 Cough variant asthma: Secondary | ICD-10-CM | POA: Diagnosis not present

## 2020-02-20 DIAGNOSIS — I129 Hypertensive chronic kidney disease with stage 1 through stage 4 chronic kidney disease, or unspecified chronic kidney disease: Secondary | ICD-10-CM | POA: Diagnosis not present

## 2020-02-20 DIAGNOSIS — N183 Chronic kidney disease, stage 3 unspecified: Secondary | ICD-10-CM | POA: Diagnosis not present

## 2020-02-20 LAB — GLUCOSE, CAPILLARY: Glucose-Capillary: 223 mg/dL — ABNORMAL HIGH (ref 70–99)

## 2020-02-20 MED ORDER — ONDANSETRON HCL 4 MG PO TABS: 4.0000 mg | ORAL_TABLET | Freq: Four times a day (QID) | ORAL | 0 refills | Status: DC | PRN

## 2020-02-20 MED ORDER — HYDROXYZINE HCL 50 MG/ML IM SOLN
50.0000 mg | Freq: Once | INTRAMUSCULAR | Status: AC
  Administered 2020-02-20: 50 mg via INTRAMUSCULAR
  Filled 2020-02-20: qty 1

## 2020-02-20 NOTE — Progress Notes (Signed)
Subjective: 1 Day Post-Op Procedure(s) (LRB): right L4 hemiaminectomy, microdiscectomy (N/A) Patient reports pain as mild.  Some nausea and vomiting earlier this AM. Otherwise doing well.   Objective: Vital signs in last 24 hours: Temp:  [97.9 F (36.6 C)-99 F (37.2 C)] 98.7 F (37.1 C) (06/26 0732) Pulse Rate:  [71-98] 79 (06/26 0732) Resp:  [8-18] 18 (06/26 0732) BP: (132-196)/(66-92) 132/66 (06/26 0732) SpO2:  [93 %-100 %] 100 % (06/26 0732)  Intake/Output from previous day: 06/25 0701 - 06/26 0700 In: 1000 [I.V.:1000] Out: 40 [Blood:40] Intake/Output this shift: Total I/O In: -  Out: 1 [Emesis/NG output:1]  Recent Labs    02/19/20 0951  HGB 15.5*   Recent Labs    02/19/20 0951  WBC 8.9  RBC 5.28*  HCT 48.5*  PLT 255   Recent Labs    02/19/20 0951  NA 137  K 3.8  CL 97*  CO2 25  BUN 23  CREATININE 1.21*  GLUCOSE 275*  CALCIUM 9.1   No results for input(s): LABPT, INR in the last 72 hours.  Lower Extremities and Lumbar : Sensation intact distally Intact pulses distally Dorsiflexion/Plantar flexion intact Incision: dressing C/D/I Compartment soft   Assessment/Plan: 1 Day Post-Op Procedure(s) (LRB): right L4 hemiaminectomy, microdiscectomy (N/A) Up with therapy  Will discharge home later today if remains stable and continues to perform well with PT.  Zofran at discharge     [facility] 02/20/2020, 9:49 AM

## 2020-02-20 NOTE — Progress Notes (Signed)
Occupational Therapy Evaluation ]Completed all education regarding compensatory strateiges for ADL and functional mobility following back precautions. Pt able to return demonstrate. Pt will have assistance as needed upon DC. No further OT needs.    02/20/20 1000  OT Visit Information  Last OT Received On 02/20/20  Assistance Needed +1  History of Present Illness Pt is a 69 y/o female s/p R L4 microdiscectomy. PMH including but not limited to DM, HTN and fibromyalgia.   Precautions  Precautions Back  Precaution Booklet Issued Yes (comment)  Precaution Comments reviewed with pt throughout  Required Braces or Orthoses  ("no brace needed" MD order)  Home Living  Family/patient expects to be discharged to: Private residence  Living Arrangements Non-relatives/Friends  Available Help at Discharge Friend(s);Available 24 hours/day  Home Access Stairs to enter  Entrance Stairs-Number of Steps 12  Entrance Stairs-Rails Right;Left  Home Layout One level  Bathroom Biomedical scientist Yes  How Accessible Accessible via walker  [place] - 2 wheels;Shower seat  Prior Function  Level of Independence Independent with assistive device(s)  Comments ambulates with a RW since her back and R LE pain began about 3 weeks ago  Communication  Communication No difficulties  Pain Assessment  Faces Pain Scale 0  Cognition  Arousal/Alertness Awake/alert  Behavior During Therapy WFL for tasks assessed/performed  Overall Cognitive Status Within Functional Limits for tasks assessed  Upper Extremity Assessment  Upper Extremity Assessment Overall WFL for tasks assessed  Lower Extremity Assessment  Lower Extremity Assessment Defer to PT evaluation  Cervical / Trunk Assessment  Cervical / Trunk Assessment Other exceptions  Cervical / Trunk Exceptions s/p lumbar sx  ADL  Overall ADL's  Needs assistance/impaired  Functional mobility during  ADLs Modified independent;Rolling walker;Cueing for safety  General ADL Comments able to complete figure positioning for LB ADL. Educated on compensatory strategies regarding back precautions. Pt able to return demonstrate. Has access to shower seat to use. Roommate can assist as needed. Handout reviewed.   Bed Mobility  Overal bed mobility Needs Assistance  Bed Mobility Sit to Sidelying;Rolling  Rolling Supervision  Sit to sidelying Supervision  General bed mobility comments cueing for log roll technique  Transfers  Overall transfer level Needs assistance  Equipment used Rolling walker (2 wheeled);None  Transfers Sit to/from Stand  Sit to Stand Modified independent (Device/Increase time)  Balance  Overall balance assessment Needs assistance  Sitting-balance support Feet supported  Sitting balance-Leahy Scale Good  Standing balance support During functional activity;No upper extremity supported  Standing balance-Leahy Scale Fair  OT - End of Session  Equipment Utilized During Treatment Rolling walker  Activity Tolerance Patient tolerated treatment well  Patient left in bed;with call bell/phone within reach  Nurse Communication Mobility status  OT Assessment  OT Recommendation/Assessment Patient does not need any further OT services  OT Visit Diagnosis Unsteadiness on feet (R26.81);Pain  Pain - part of body  (back)  OT Problem List Decreased safety awareness;Decreased knowledge of use of DME or AE;Decreased knowledge of precautions;Pain  AM-PAC OT "6 Clicks" Daily Activity Outcome Measure (Version 2)  Help from another person eating meals? 4  Help from another person taking care of personal grooming? 4  Help from another person toileting, which includes using toliet, bedpan, or urinal? 4  Help from another person bathing (including washing, rinsing, drying)? 3  Help from another person to put on and taking off regular upper body clothing? 4  Help from another person  to put on and  taking off regular lower body clothing? 3  6 Click Score 22  OT Recommendation  Follow Up Recommendations Supervision - Intermittent;No OT follow up  OT Equipment None recommended by OT  Acute Rehab OT Goals  Patient Stated Goal "home today"  OT Goal Formulation All assessment and education complete, DC therapy  OT Time Calculation  OT Start Time (ACUTE ONLY) 0349  OT Stop Time (ACUTE ONLY) 0940  OT Time Calculation (min) 18 min  OT General Charges  $OT Visit 1 Visit  OT Evaluation  $OT Eval Low Complexity 1 Low  Maurie Boettcher, OT/L   Acute OT Clinical Specialist Acute Rehabilitation Services Pager [phone] Office [phone]

## 2020-02-20 NOTE — Evaluation (Signed)
Physical Therapy Evaluation Patient Details Name: [name] MRN: [id number] DOB: 11-21-1950 Today's Date: 02/20/2020   History of Present Illness  Pt is a 69 y/o female s/p R L4 microdiscectomy. PMH including but not limited to DM, HTN and fibromyalgia.   Clinical Impression  Pt presented supine in bed with HOB elevated, awake and willing to participate in therapy session. Prior to admission, pt reported that she has been ambulating with use of a RW for the past three weeks since her back and R LE pain began. Pt lives with a roommate in a second level condo with a full flight of stairs to enter. At the time of evaluation, pt overall moving very well with minimal pain. She was at a supervision level overall for functional mobility including hallway ambulation with use of RW. Pt declining stair training this session. PT provided pt education re: back precautions with handout provided and a generalized walking program for pt to initiate upon d/c home. Pt expressed understanding. Plan is to d/c home today with roommate's support.     Follow Up Recommendations No PT follow up;Other (comment) (pt declining HHPT)    Equipment Recommendations  None recommended by PT    Recommendations for Other Services       Precautions / Restrictions Precautions Precautions: Back Precaution Booklet Issued: Yes (comment) Precaution Comments: reviewed with pt throughout Required Braces or Orthoses:  ("no brace needed" MD order) Restrictions Weight Bearing Restrictions: No      Mobility  Bed Mobility Overal bed mobility: Needs Assistance Bed Mobility: Sit to Sidelying;Rolling Rolling: Supervision       Sit to sidelying: Supervision General bed mobility comments: cueing for log roll technique  Transfers Overall transfer level: Needs assistance Equipment used: Rolling walker (2 wheeled);None Transfers: Sit to/from Stand Sit to Stand: Supervision         General transfer comment: no  instability or LOB  Ambulation/Gait Ambulation/Gait assistance: Supervision Gait Distance (Feet): 50 Feet Assistive device: Rolling walker (2 wheeled) Gait Pattern/deviations: Step-through pattern;Decreased stride length Gait velocity: decreased   General Gait Details: pt steady overall with hallway ambulation with use of RW, no LOB or need for physical assistance; limited secondary to fatigue  Stairs Stairs:  (pt declining stair training)          Wheelchair Mobility    Modified Rankin (Stroke Patients Only)       Balance Overall balance assessment: Needs assistance Sitting-balance support: Feet supported Sitting balance-Leahy Scale: Good     Standing balance support: During functional activity;No upper extremity supported Standing balance-Leahy Scale: Fair                               Pertinent Vitals/Pain Pain Assessment: Faces Faces Pain Scale: No hurt    Home Living Family/patient expects to be discharged to:: Private residence Living Arrangements: Non-relatives/Friends Available Help at Discharge: Friend(s);Available 24 hours/day   Home Access: Stairs to enter Entrance Stairs-Rails: Right;Left Entrance Stairs-Number of Steps: 12 Home Layout: One level Home Equipment: Walker - 2 wheels      Prior Function Level of Independence: Independent with assistive device(s)         Comments: ambulates with a RW since her back and R LE pain began about 3 weeks ago     Hand Dominance        Extremity/Trunk Assessment   Upper Extremity Assessment Upper Extremity Assessment: Defer to OT evaluation;Overall [facility] for tasks assessed  Lower Extremity Assessment Lower Extremity Assessment: Overall WFL for tasks assessed    Cervical / Trunk Assessment Cervical / Trunk Assessment: Other exceptions Cervical / Trunk Exceptions: s/p lumbar sx  Communication   Communication: No difficulties  Cognition Arousal/Alertness: Awake/alert Behavior  During Therapy: WFL for tasks assessed/performed Overall Cognitive Status: Within Functional Limits for tasks assessed                                        General Comments      Exercises     Assessment/Plan    PT Assessment Patient needs continued PT services  PT Problem List Decreased activity tolerance;Decreased balance;Decreased mobility;Decreased coordination;Decreased knowledge of use of DME;Decreased safety awareness;Decreased knowledge of precautions       PT Treatment Interventions DME instruction;Gait training;Stair training;Functional mobility training;Therapeutic exercise;Therapeutic activities;Balance training;Neuromuscular re-education;Patient/family education    PT Goals (Current goals can be found in the Care Plan section)  Acute Rehab PT Goals Patient Stated Goal: "home today" PT Goal Formulation: With patient Time For Goal Achievement: 02/27/20 Potential to Achieve Goals: Good    Frequency Min 5X/week   Barriers to discharge        Co-evaluation               AM-PAC PT "6 Clicks" Mobility  Outcome Measure Help needed turning from your back to your side while in a flat bed without using bedrails?: None Help needed moving from lying on your back to sitting on the side of a flat bed without using bedrails?: None Help needed moving to and from a bed to a chair (including a wheelchair)?: None Help needed standing up from a chair using your arms (e.g., wheelchair or bedside chair)?: None Help needed to walk in hospital room?: None Help needed climbing 3-5 steps with a railing? : A Little 6 Click Score: 23    End of Session   Activity Tolerance: Patient limited by fatigue Patient left: in bed;with call bell/phone within reach Nurse Communication: Mobility status PT Visit Diagnosis: Other abnormalities of gait and mobility (R26.89)    Time: 4536-4680 PT Time Calculation (min) (ACUTE ONLY): 19 min   Charges:   PT  Evaluation $PT Eval Low Complexity: 1 Low          Eduard Clos, PT, DPT  Acute Rehabilitation Services Pager [phone] Office [place] 02/20/2020, 9:39 AM

## 2020-02-20 NOTE — Plan of Care (Signed)
Patient alert and oriented, mae's well, voiding adequate amount of urine, swallowing without difficulty, no c/o pain at time of discharge. Patient discharged home with family. Script and discharged instructions given to patient. Patient and family stated understanding of instructions given. Patient has an appointment with Dr. Yates  

## 2020-02-26 ENCOUNTER — Ambulatory Visit: Payer: Self-pay

## 2020-02-26 ENCOUNTER — Other Ambulatory Visit: Payer: Self-pay

## 2020-02-26 DIAGNOSIS — I10 Essential (primary) hypertension: Secondary | ICD-10-CM

## 2020-02-26 DIAGNOSIS — E1165 Type 2 diabetes mellitus with hyperglycemia: Secondary | ICD-10-CM

## 2020-02-26 NOTE — Chronic Care Management (AMB) (Signed)
Chronic Care Management Pharmacy  Name: [name]  MRN: [id number] DOB: 11-13-1950  Chief Complaint/ HPI  [name],  69 y.o. , female presents for their Follow-Up CCM visit with the clinical pharmacist via telephone due to COVID-19 Pandemic. Pt reports that she is doing better since surgery on 6/25. She says the pain comes and goes, but that it is getting better. She reports that she is not sleeping well right now. She cannot get comfortable at night, but does report sleeping during the day.   PCP : [name], FNP  Their chronic conditions include: Hypertension, Hyperlipidemia, Asthma, GERD, Diabetes with stage III CKD, Osteoarthritis  Office Visits: 12/31/19 AWV and OV: Presented for Annual exam. Labs ordered (HgbA1c, Lipid panel, CMP, CBC, Vitamin D). BG has been more elevated recently. Started on Farxiga 79m daily. Fair control of BP, continue current medications. Continue to use pain cream for knee pain as needed; if worsens, will consider referral to orthopedics.   10/22/19 OV: Presented for follow up diabetic visit; labs ordered (lipid panel, CMP14+EGFR, HGB A1c, Vit B12, Vit D). Vitamin D and B12 low. Continue Vitamin D supplement and consider B12 injections. Dietary and exercise recommendations provided. Follow up in 3 months.  09/03/19 OV: Presented for follow up diabetic visit; Lunchtime Humalog increased due to elevated BG (30 units before breakfast, 35 units before lunch, 25 units before supper); constipation relieved with Amitiza.   07/22/19 OV: Presented for constipation, bloating and flatulence. Stool softeners and Metamucil have not relieved symptoms. Referred to GI for dysphagia, Labs ordered (lipid panel, CMP14+EGFR, HgbA1c), diabetic foot exam performed, continue checking blood sugar and injecting insulin three times daily w/ ozempic weekly. Start Amitiza for constipation. Follow up in 4-6 weeks. Influenza vaccine administered.   Consult Visit: 02/19/20  Right L4-5 microdiscetomy hemilaminectomy and removal of free fragment.  02/17/20 Orthopedic surgery OV w/ Dr. YLorin Mercy Referred to surgery for herniated nucleus polposus. In significant pain during exam. Pt had already eaten at before OV. Surgery scheduled for Friday, 6/25, for removal of migrated fragment.    02/16/20 Ortho OV w/ Dr. HJunius Roads Presented for low back pain and right sided radiculopathy for the past 2 weeks. Complains of urinary incontinence since new onset of pain. Rx for Dilaudid short course. [facility]/ Dr. YLorin Mercyfor possible surgical intervention.   02/13/20 ED visit: Presented for leg pain. Seen in ED 3 days ago for same issue. She fell on buttocks yesterday while using her walker (her leg gave out). Pain is worse with walking. Pt reports shooting pain, pressure, and weakness. MRI showed new L4-5 disc extrusion with right L4 nerve root impingement in lateral recess. 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 No acute fracture. Discharged with 6 days prednisone taper.    02/10/20 ED visit: Presented with right leg pain for past several days. Pt denies trauma or injury. Pain starts in lower back and radiates to thigh, shin, and down her leg. Labs were unremarkable. Hip Xrays unremarkable. CT showed multilevel disease worse on right L4-5. Administered multiple rounds of pain medicine, muscle relaxants, steroids, and NSAIDs. Pain improved, no MRI needed. Refer to spine surgeon outpatient. Prescribed short course of Norco and Baclofen at discharge. Follow up with PCP in 1-2 days.    10/18/19- ED visit: Presented for sudden onset left ear pain 4 days prior with clear, watery drainage; left eardrum found to be perforated upon  exam; recommended naproxen or Tylenol for pain and follow up with ENT.   CCM Encounters: 01/15/20 RN: Reviewed and updated care plan and pt goals  09/16/19 PharmD: Comprehensive  medication review, patient education. Pt reported issues receiving FreeStyle Libre.   08/07/19 PharmD: Pt recently seen by GI. Failed capsule swallow evaluation. GI to schedule esophageal dilation procedure. Encouraged pt to crush meds as directed due to difficulty swallowing.  07/31/19 PharmD: Working to obtain [employer] CGM. Pt approved for another 6 moths by [place].   07/29/19 PharmD:  Collaboration with [place] for patient's FreeStyle Libre testing supplies  07/27/19 PharmD: Patient requesting FreeStyle Libre assistance, denied at pharmacy. [facility] contacted.   07/22/19 RN: Patient education, medication review, coordination of diabetes testing supplies  07/17/19 RN: Compliance with GI and Urology treatment plans encourage, patient education.  Medications: Outpatient Encounter Medications as of 02/26/2020  Medication Sig   AMITIZA 8 MCG capsule TAKE 1 CAPSULE(8 MCG) BY MOUTH DAILY WITH BREAKFAST (Patient taking differently: Take 8 mcg by mouth daily with breakfast. )   amLODipine (NORVASC) 10 MG tablet TAKE 1 TABLET(10 MG) BY MOUTH AT BEDTIME (Patient taking differently: Take 10 mg by mouth every evening. )   Baclofen 5 MG TABS TAKE 1 TABLET(5MG TOTAL) BY MOUTH THREE TIMES DAILY AS NEEDED** CAN INCREASE TO TAKE 2 TABLETS(10MG TOTAL) IF TOLERATE 5MG (Patient taking differently: Take 5 mg by mouth 3 (three) times daily as needed (pain). CAN INCREASE TO TAKE 2 TABLETS(10MG TOTAL) IF TOLERATE 5MG)   Continuous Blood Gluc Receiver (FREESTYLE LIBRE READER) DEVI 1 each by Does not apply route See admin instructions. CGM device-Freestyle Libre   Continuous Blood Gluc Sensor (FREESTYLE LIBRE 14 DAY SENSOR) MISC 1 each by Does not apply route See admin instructions. CGM 14-day sensors; send refills to [employer] [facility] (in [facility])    dapagliflozin propanediol (FARXIGA) 5 MG TABS tablet Take 5 mg by mouth daily before breakfast.   docusate sodium (COLACE) 100 MG  capsule Take 100 mg by mouth daily as needed for mild constipation.    gabapentin (NEURONTIN) 300 MG capsule TAKE 1 CAPSULE(300 MG) BY MOUTH THREE TIMES DAILY (Patient taking differently: Take 300 mg by mouth 3 (three) times daily. )   HUMALOG 100 UNIT/ML injection INJECT 25 UNITS INTO SKIN WITH BREAFAST AND SUPPER AND 35 UNITS WITH LUNCH (Patient taking differently: Inject 25-35 Units into the skin 3 (three) times daily with meals. Inject 35 units at breakfast, 25 units at lunch and 35 units at supper)   Lancets Misc. MISC 1 each by Does not apply route 4 (four) times daily -  with meals and at bedtime.   mirabegron ER (MYRBETRIQ) 50 MG TB24 tablet Take 1 tablet (50 mg total) by mouth daily.   Multiple Vitamin (MULTIVITAMIN WITH MINERALS) TABS tablet Take 1 tablet by mouth daily.   ondansetron (ZOFRAN) 4 MG tablet Take 1 tablet (4 mg total) by mouth every 6 (six) hours as needed for nausea or vomiting.   oxyCODONE-acetaminophen (PERCOCET) 5-325 MG tablet Take 1-2 tablets by mouth every 6 (six) hours as needed for severe pain. Post op pain   pravastatin (PRAVACHOL) 80 MG tablet Take 0.5 tablets (40 mg total) by mouth daily.   Semaglutide, 1 MG/DOSE, (OZEMPIC, 1 MG/DOSE,) 2 MG/1.5ML SOPN Inject 1 mg into the skin every Thursday.   spironolactone (ALDACTONE) 50 MG tablet TAKE 1 TABLET(50 MG) BY MOUTH DAILY (Patient taking differently: Take 50 mg by mouth daily. )   Vitamin D,  Ergocalciferol, (DRISDOL) 1.25 MG (50000 UNIT) CAPS capsule TAKE 1 CAPSULE BY MOUTH EVERY 7 DAYS ON WEDNESDAYS (Patient taking differently: Take 50,000 Units by mouth every 7 (seven) days. TAKE 1 CAPSULE BY MOUTH EVERY 7 DAYS ON WEDNESDAYS)   albuterol (PROAIR HFA) 108 (90 Base) MCG/ACT inhaler 2 puffs every 4 hours as needed only  if your can't catch your breath (Patient not taking: Reported on 02/26/2020)   mometasone-formoterol (DULERA) 100-5 MCG/ACT AERO Inhale 2 puffs into the lungs as needed for wheezing. (Patient  not taking: Reported on 02/26/2020)   pantoprazole (PROTONIX) 40 MG tablet Take 30- 60 min before your first and last meals of the day (Patient not taking: Reported on 02/26/2020)   predniSONE (DELTASONE) 20 MG tablet 3 po once a day for 2 days, then 2 po once a day for 3 days, then 1 po once a day for 3 days (Patient not taking: Reported on 02/26/2020)   No facility-administered encounter medications on file as of 02/26/2020.   Current Diagnosis/Assessment:  Goals Addressed            This Visit's Progress    Pharmacy Care Plan   On track    CARE PLAN ENTRY  Current Barriers:   Chronic Disease Management support, education, and care coordination needs related to Hypertension, Hyperlipidemia, and Diabetes   Hypertension  Pharmacist Clinical Goal(s): o Over the next 45 days, patient will work with PharmD and providers to achieve BP goal <130/80  Current regimen:  o Amlodipine 24m daily o Spironolactone 563mdaily  Interventions: o Have collaborated with PCP regarding absence of losartan. PCP to address at next office visit  Patient self care activities - Over the next 45 days, patient will: o Check BP once weekly and if symptomatic, document, and provide at future appointments o Ensure daily salt intake < 2300 mg/day  Hyperlipidemia  Pharmacist Clinical Goal(s): o Over the next 45 days, patient will work with PharmD and providers to achieve LDL goal < 70  Current regimen:  o Pravastatin 8022m/2 tablet daily  Interventions: o Provided dietary recommendations  Patient self care activities - Over the next 45 days, patient will: o Focus on a heart-healthy, balanced diet as discussed  Diabetes  Pharmacist Clinical Goal(s): o Over the next 45 days, patient will work with PharmD and providers to maintain A1c goal <7%  Current regimen:  o Ozempic 1mg19mekly o Humalog 25 units before breakfast, 35 units before lunch, and 25 units before dinner  Interventions: o Dietary  recommendations provided o Patient education regarding Humalog as a rapid-acting insulin which is used for meal-time blood sugar coverage o Encourage patient to use Humalog three times daily (at each meal) as directed and stressed the importance of doing so  Patient self care activities - Over the next 45 days, patient will: o Check blood sugar continuously, but at least 3-4 times daily, document, and provide at future appointments o Contact provider with any episodes of hypoglycemia o Increase water intake by 1 glass a day  Medication management  Pharmacist Clinical Goal(s): o Over the next 45 days, patient will work with PharmD and providers to achieve optimal medication adherence  Current pharmacy: [facility]  Interventions o Comprehensive medication review performed. o Utilize [facility] for medication synchronization, packaging and delivery  Patient self care activities - Over the next 45 days, patient will: o Focus on medication adherence by using adherence packaging o Take medications as prescribed o Report any questions or concerns to  PharmD and/or provider(s)  Please see past updates related to this goal by clicking on the "Past Updates" button in the selected goal                           Diabetes   Recent Relevant Labs: Lab Results  Component Value Date/Time   HGBA1C 5.9 (H) 12/31/2019 04:11 PM   HGBA1C 9.0 (H) 10/22/2019 04:03 PM   MICROALBUR 80 12/31/2019 03:19 PM   MICROALBUR 6.9 (H) 09/07/2016 01:41 PM   MICROALBUR 7.2 (H) 08/30/2015 01:59 PM   Kidney Function Lab Results  Component Value Date/Time   CREATININE 1.21 (H) 02/19/2020 09:51 AM   CREATININE 1.30 (H) 02/10/2020 09:32 PM   GFR 51.07 (L) 05/17/2017 08:54 AM   GFRNONAA 46 (L) 02/19/2020 09:51 AM   GFRAA 54 (L) 02/19/2020 09:51 AM  Stage 3a CKD   Checking BG: Multiple times daily with continuous glucose monitor (FreeStyle Libre)  Recent FBG Readings: 223 Recent pre-meal BG readings:    Recent 2hr PP BG readings:  199, 243, 274, 257, 217, 297 Recent HS BG readings:  Patient has failed these meds in past: Invokana, Levemir, Lantus, Victoza, metformin, Actos, Janumet Patient is currently controlled on the following medications:  -Ozempic 53m weekly on Thursdays  -Humalog 25-35 units before meals (25U am, 35U lunch, 25U dinner)          Also doing additional sliding scale sometimes (5-10 units) -Farxiga 583mdaily  -Gabapentin 30089mhree times daily  Last diabetic Foot exam: 07/22/19 Last diabetic Eye exam: 08/10/19   We discussed:   Diet extensively  Pt reports drinking about 32 oz of water per day.    Encouraged pt to increase water intake by 1 glass daily (4-6oz).   Also drinking diet ginger ale, sweet tea, orange juice, and Pedialyte  Explained that some of these may be contributing to high BG and pt stated she had decreased her sweet tea consumption considerably  Pt reports that appetite is not great, but she knows she has to eat while on insulin  Eating fiber cereal, boiled eggs, soup, peas, broccoli, squash, sweet potato, oatmeal, baked fish, baked chicken (since hospital discharge)  Eats 3 meals a day, but not big meals  Exercise extensively  Pt not able to exercise at this time due to back surgery on 02/19/20  Pt reports that she has missed at least 2 doses of Humalog in the last week when she is in pain or sleeping  Educated pt that Humalog is a rapid acting insulin and only controls blood sugar for up to 5 hours. If she skips a dose then her blood sugars are going to be elevated, especially if she eats  Instructed pt to take Humalog three times daily if eating 3 meals a day  Pt thought Humalog was a long-acting insulin and did not realize it did not control sugars all day (she was previously on long-acting insulin)  Elevated BG levels could be due to missing Humalog, pain (recent surgery), or diet  Plan -Continue current medications  -May  need to increase Farxiga if BG remains elevated. A1c was at goal in May, but BG readings lately have been above goal. Will review pt's BG at follow up visit and discuss with PCP  Hypertension   Office blood pressures are  BP Readings from Last 3 Encounters:  02/20/20 132/66  02/13/20 (!) 165/86  02/11/20 118/68   Patient has failed these meds in  the past: Carvedilol, clonidine, metoprolol, valsartan/HCTZ, losartan/HCTZ Patient is currently uncontrolled on the following medications:  -Amlodipine 36m daily -Spironolactone 539mdaily  Patient checks BP at home once a week and when feeling symptomatic  Patient home BP readings are ranging: 130/87, 127/70  We discussed:  Goal <130/80  Pt states BP has been good since she got out of the hospital  Plan -Continue current medications  -PCP to reassess absence of losartan at next appointment   Hyperlipidemia   Lipid Panel     Component Value Date/Time   CHOL 168 12/31/2019 1611   TRIG 88 12/31/2019 1611   HDL 54 12/31/2019 1611   CHOLHDL 3.1 12/31/2019 1611   CHOLHDL 4 09/07/2016 1341   VLDL 15.6 09/07/2016 1341   LDLCALC 98 12/31/2019 1611   LDLDIRECT 152.8 10/09/2013 0938   LABVLDL 16 12/31/2019 1611     The 10-year ASCVD risk score (GMikey BussingC Jr., et al., 2013) is: 21%   Values used to calculate the score:     Age: [age]     Sex: Female     Is Non-Hispanic African American: Yes     Diabetic: Yes     Tobacco smoker: No     Systolic Blood Pressure: 13025mHg     Is BP treated: Yes     HDL Cholesterol: 54 mg/dL     Total Cholesterol: 168 mg/dL   Patient has failed these meds in past: N/A Patient is currently uncontrolled on the following medications:  -Pravastatin 80 mg daily, 1/2 tablet daily -Aspirin 8199maily  We discussed:   -Goal LDL < 70 -Diet extensively  Plan -Continue current medications   History of Asthma    Tobacco Status:  Social History   Tobacco Use  Smoking Status Never Smoker   Smokeless Tobacco Never Used   Patient has failed these meds in past: Advair Patient is currently controlled on the following medications:  -Proair 2 puffs every 4 hours as needed -Dulera 2 puffs every 12 hours as needed  Using maintenance inhaler regularly? No Frequency of rescue inhaler use:  infrequently as needed  We discussed:   -Has not used inhalers in over half a year  Plan -Continue current medications  Constipation   Patient has failed these meds in past: Stool softeners, metamucil Patient is currently controlled on the following medications:  -Amitiza 8mc63maily -Docusate 100mg42mly  We discussed: -Goal for water intake is 64oz daily  Plan -Continue current medications  Overactive bladder    Patient has failed these meds in past: Vesicare, oxybutynin Patient is currently controlled on the following medications:  -Myrbetriq 50mg 11my  Plan -Continue current medications  Vitamin D Deficiency  Vitamin D: 29.8 on 10/22/19  Patient has failed these meds in past: N/A Patient is currently uncontrolled on the following medications:  -Ergocalciferol 50,000 units daily on Thursdays  Plan -Continue current medications  Vaccines   Reviewed and discussed patient's vaccination history.    Immunization History  Administered Date(s) Administered   Influenza Whole 06/27/2009, 06/13/2010   Influenza, High Dose Seasonal PF 07/22/2019   Influenza,inj,Quad PF,6+ Mos 06/03/2017   Influenza-Unspecified 03/27/2013, 03/27/2014, 05/18/2015   Pneumococcal Conjugate-13 07/24/2017   Pneumococcal Polysaccharide-23 08/27/2006, 11/29/2009, 03/24/2019   Td 10/18/2009   Tdap 12/27/2017   -[facility] -No recommendations at this time  Medication Management   [facility]  Pt endorses 70% compliance  We discussed:  -Importance  of medication adherence  Plan Utilize [facility] for medication synchronization, packaging and delivery  Verbal consent obtained for [facility] enhanced pharmacy services (medication synchronization, adherence packaging, delivery coordination). A medication sync plan was created to allow patient to get all medications delivered once every 30 to 90 days per patient preference. Patient understands they have freedom to choose pharmacy and clinical pharmacist will coordinate care between all prescribers and [facility].   Follow up: 2 month phone call   Jannette Fogo, PharmD Clinical Pharmacist [facility] [phone]

## 2020-02-26 NOTE — Patient Instructions (Signed)
Visit Information  Goals Addressed            This Visit's Progress   . Pharmacy Care Plan   On track    CARE PLAN ENTRY  Current Barriers:  . Chronic Disease Management support, education, and care coordination needs related to Hypertension, Hyperlipidemia, and Diabetes   Hypertension . Pharmacist Clinical Goal(s): o Over the next 45 days, patient will work with PharmD and providers to achieve BP goal <130/80 . Current regimen:  o Amlodipine 10mg  daily o Spironolactone 50mg  daily . Interventions: o Have collaborated with PCP regarding absence of losartan. PCP to address at next office visit . Patient self care activities - Over the next 45 days, patient will: o Check BP once weekly and if symptomatic, document, and provide at future appointments o Ensure daily salt intake < 2300 mg/day  Hyperlipidemia . Pharmacist Clinical Goal(s): o Over the next 45 days, patient will work with PharmD and providers to achieve LDL goal < 70 . Current regimen:  o Pravastatin 80mg  1/2 tablet daily . Interventions: o Provided dietary recommendations . Patient self care activities - Over the next 45 days, patient will: o Focus on a heart-healthy, balanced diet as discussed  Diabetes . Pharmacist Clinical Goal(s): o Over the next 45 days, patient will work with PharmD and providers to maintain A1c goal <7% . Current regimen:  o Ozempic 1mg  weekly o Humalog 25 units before breakfast, 35 units before lunch, and 25 units before dinner . Interventions: o Dietary recommendations provided o Patient education regarding Humalog as a rapid-acting insulin which is used for meal-time blood sugar coverage o Encourage patient to use Humalog three times daily (at each meal) as directed and stressed the importance of doing so . Patient self care activities - Over the next 45 days, patient will: o Check blood sugar continuously, but at least 3-4 times daily, document, and provide at future  appointments o Contact provider with any episodes of hypoglycemia o Increase water intake by 1 glass a day  Medication management . Pharmacist Clinical Goal(s): o Over the next 45 days, patient will work with PharmD and providers to achieve optimal medication adherence . Current pharmacy: [facility] . Interventions o Comprehensive medication review performed. o Utilize [facility] for medication synchronization, packaging and delivery . Patient self care activities - Over the next 45 days, patient will: o Focus on medication adherence by using adherence packaging o Take medications as prescribed o Report any questions or concerns to PharmD and/or provider(s)  Please see past updates related to this goal by clicking on the "Past Updates" button in the selected goal         Patient verbalizes understanding of instructions provided today.   Telephone follow up appointment with pharmacy team member scheduled for: 04/21/20 @ 3:30PM  Jannette Fogo, PharmD Clinical Pharmacist [facility] [phone]

## 2020-02-28 ENCOUNTER — Other Ambulatory Visit: Payer: Self-pay | Admitting: Nurse Practitioner

## 2020-03-01 ENCOUNTER — Telehealth: Payer: Self-pay | Admitting: Orthopaedic Surgery

## 2020-03-01 ENCOUNTER — Other Ambulatory Visit: Payer: Self-pay

## 2020-03-01 MED ORDER — GABAPENTIN 300 MG PO CAPS: ORAL_CAPSULE | ORAL | 1 refills | Status: DC

## 2020-03-01 MED ORDER — VITAMIN D (ERGOCALCIFEROL) 1.25 MG (50000 UNIT) PO CAPS: 50000.0000 [IU] | ORAL_CAPSULE | ORAL | 1 refills | Status: DC

## 2020-03-01 NOTE — Telephone Encounter (Signed)
Pt called stating she is gonna want to discuss PT at her appt on 03/02/20

## 2020-03-01 NOTE — Telephone Encounter (Signed)
noted 

## 2020-03-01 NOTE — Discharge Summary (Signed)
Patient ID: [name] MRN: [id number] DOB/AGE: [birth year] 69 y.o.  Admit date: 02/19/2020 Discharge date: 02/20/2020 Admission Diagnoses:  Active Problems:   Herniated lumbar intervertebral disc   Discharge Diagnoses:  Active Problems:   Herniated lumbar intervertebral disc  status post Procedure(s): right L4 hemiaminectomy, microdiscectomy  Past Medical History:  Diagnosis Date  . Aortic stenosis    mild on 04/07/19 echo  . Arthritis    L knee, hands, back   . Asthma   . Diabetes mellitus type 2, insulin dependent ([place])   . Fibromyalgia   . Full dentures   . Full dentures   . GERD (gastroesophageal reflux disease)   . H/O hiatal hernia   . Headache    h/o migraines - was followed for a time with a wellness doctor  . History of esophageal dilatation   . History of rhabdomyolysis    03/ 2015  . Hyperlipidemia   . Hypertension   . Insulin pump in place    since 12/ 2014--  [employer]  . Lumbar disc herniation    right leg weakness  . Osteoarthritis of left knee, primary localized 08/17/2014  . Renal insufficiency   . SUI (stress urinary incontinence, female)   . Wears glasses   . Wears glasses     Surgeries: Procedure(s): right L4 hemiaminectomy, microdiscectomy on 02/19/2020   Consultants:   Discharged Condition: Improved  Hospital Course: [name] is an 69 y.o. female who was admitted 02/19/2020 for operative treatment of lumbar HNP. Patient failed conservative treatments (please see the history and physical for the specifics) and had severe unremitting pain that affects sleep, daily activities and work/hobbies. After pre-op clearance, the patient was taken to the operating room on 02/19/2020 and underwent  Procedure(s): right L4 hemiaminectomy, microdiscectomy.    Patient was given perioperative antibiotics:  Anti-infectives (From admission, onward)   Start     Dose/Rate Route Frequency Ordered Stop   02/19/20 1900  ceFAZolin (ANCEF)  IVPB 1 g/50 mL premix        1 g 100 mL/hr over 30 Minutes Intravenous Every 8 hours 02/19/20 1650 02/20/20 0243       Patient was given sequential compression devices and early ambulation to prevent DVT.   Patient benefited maximally from hospital stay and there were no complications. At the time of discharge, the patient was urinating/moving their bowels without difficulty, tolerating a regular diet, pain is controlled with oral pain medications and they have been cleared by PT/OT.   Recent vital signs: No data found.   Recent laboratory studies: No results for input(s): WBC, HGB, HCT, PLT, NA, K, CL, CO2, BUN, CREATININE, GLUCOSE, INR, CALCIUM in the last 72 hours.  Invalid input(s): PT, 2   Discharge Medications:   Allergies as of 02/20/2020      Reactions   Pollen Extract Other (See Comments)   Congestion/sneezing   Tomato Hives, Itching   Codeine Hives, Itching, Nausea And Vomiting      Medication List    STOP taking these medications   HYDROmorphone 2 MG tablet Commonly known as: Dilaudid   naproxen sodium 220 MG tablet Commonly known as: ALEVE     TAKE these medications   albuterol 108 (90 Base) MCG/ACT inhaler Commonly known as: ProAir HFA 2 puffs every 4 hours as needed only  if your can't catch your breath   Amitiza 8 MCG capsule Generic drug: lubiprostone TAKE 1 CAPSULE(8 MCG) BY MOUTH DAILY WITH BREAKFAST What changed: See the  new instructions.   amLODipine 10 MG tablet Commonly known as: NORVASC TAKE 1 TABLET(10 MG) BY MOUTH AT BEDTIME What changed: See the new instructions.   Baclofen 5 MG Tabs TAKE 1 TABLET(5MG  TOTAL) BY MOUTH THREE TIMES DAILY AS NEEDED** CAN INCREASE TO TAKE 2 TABLETS(10MG  TOTAL) IF TOLERATE 5MG  What changed: See the new instructions.   docusate sodium 100 MG capsule Commonly known as: COLACE Take 100 mg by mouth daily as needed for mild constipation.   Dulera 100-5 MCG/ACT Aero Generic drug: mometasone-formoterol Inhale 2  puffs into the lungs as needed for wheezing.   Farxiga 5 MG Tabs tablet Generic drug: dapagliflozin propanediol Take 5 mg by mouth daily before breakfast.   FreeStyle Libre 14 Day Sensor Misc 1 each by Does not apply route See admin instructions. CGM 14-day sensors; send refills to [employer] [facility] (in [place])   [employer] 1 each by Does not apply route See admin instructions. CGM device-Freestyle Libre   HumaLOG 100 UNIT/ML injection Generic drug: insulin lispro INJECT 25 UNITS INTO SKIN WITH BREAFAST AND SUPPER AND 35 UNITS WITH LUNCH What changed: See the new instructions.   Lancets Misc. Misc 1 each by Does not apply route 4 (four) times daily -  with meals and at bedtime.   mirabegron ER 50 MG Tb24 tablet Commonly known as: Myrbetriq Take 1 tablet (50 mg total) by mouth daily.   multivitamin with minerals Tabs tablet Take 1 tablet by mouth daily.   ondansetron 4 MG tablet Commonly known as: ZOFRAN Take 1 tablet (4 mg total) by mouth every 6 (six) hours as needed for nausea or vomiting.   oxyCODONE-acetaminophen 5-325 MG tablet Commonly known as: Percocet Take 1-2 tablets by mouth every 6 (six) hours as needed for severe pain. Post op pain   Ozempic (1 MG/DOSE) 2 MG/1.5ML Sopn Generic drug: Semaglutide (1 MG/DOSE) Inject 1 mg into the skin every Thursday.   pantoprazole 40 MG tablet Commonly known as: Protonix Take 30- 60 min before your first and last meals of the day   pravastatin 80 MG tablet Commonly known as: PRAVACHOL Take 0.5 tablets (40 mg total) by mouth daily.   predniSONE 20 MG tablet Commonly known as: DELTASONE 3 po once a day for 2 days, then 2 po once a day for 3 days, then 1 po once a day for 3 days   spironolactone 50 MG tablet Commonly known as: ALDACTONE TAKE 1 TABLET(50 MG) BY MOUTH DAILY What changed: See the new instructions.   Vitamin D (Ergocalciferol) 1.25 MG (50000 UNIT) Caps capsule Commonly known as: DRISDOL TAKE  1 CAPSULE BY MOUTH EVERY 7 DAYS ON WEDNESDAYS What changed: See the new instructions.       Diagnostic Studies: DG Chest 2 View  Result Date: 02/19/2020 CLINICAL DATA:  Preop for lumbar surgery. EXAM: CHEST - 2 VIEW COMPARISON:  None. FINDINGS: The heart size and mediastinal contours are within normal limits. Both lungs are clear. The visualized skeletal structures are unremarkable. IMPRESSION: No active cardiopulmonary disease. Electronically Signed   By: Marijo Conception M.D.   On: 02/19/2020 09:16   DG Lumbar Spine 2-3 Views  Result Date: 02/19/2020 CLINICAL DATA:  Lumbar surgery. EXAM: LUMBAR SPINE - 2-3 VIEW COMPARISON:  Lumbar MRI 02/13/2020. FINDINGS: Lumbar spine numbered as per prior MRI. Metallic marker noted posteriorly along the lower portion of L4. Degenerative change again noted. No acute bony abnormality identified. IMPRESSION: Metallic marker noted posteriorly along the lower portion of L4.  Electronically Signed   By: Marcello Moores  Register   On: 02/19/2020 14:33   DG Lumbar Spine Complete  Result Date: 02/13/2020 CLINICAL DATA:  69 year old female with back pain after a fall EXAM: LUMBAR SPINE - COMPLETE 4+ VIEW COMPARISON:  10/01/2018 FINDINGS: Lumbar Spine: Lumbar vertebral elements maintain normal alignment without evidence of anterolisthesis, retrolisthesis, subluxation. No acute fracture line identified. Vertebral body heights maintained. Mild disc space narrowing with endplate sclerosis at H8-I6 similar to the prior. Early facet disease of L4-L5 and L5-S1. Oblique images demonstrate no displaced pars defect. Unremarkable appearance of the visualized abdomen. IMPRESSION: Negative for acute fracture or malalignment of the lumbar spine. Electronically Signed   By: Corrie Mckusick D.O.   On: 02/13/2020 14:26   CT Lumbar Spine Wo Contrast  Result Date: 02/10/2020 CLINICAL DATA:  Continued low back pain EXAM: CT LUMBAR SPINE WITHOUT CONTRAST TECHNIQUE: Multidetector CT imaging of the  lumbar spine was performed without intravenous contrast administration. Multiplanar CT image reconstructions were also generated. COMPARISON:  CT cervical spine 10/01/2018, CT chest 11/16/2015, lumbar radiographs 10/01/2018 FINDINGS: Segmentation: 5 lumbar type vertebral bodies with the lowest fully formed disc space denoted as L5-S1 confirmed by comparison imaging of the cervical spine and chest. Unfused L1 transverse processes, normal congenital variant. Alignment: Preservation of the normal lumbar lordosis. There is mild gradual dextrocurvature of the lumbar levels which is likely on a positional basis given absence of this finding on comparison radiographs. Slight asymmetric widening of the right L4-5 articular facet is favored to be degenerative given the presence of vacuum phenomenon and degenerative fragmentation at this level. Vertebrae: No acute vertebral body fracture or height loss is evident. The posterior elements are intact. Some degenerative osteophyte formations noted about the posterior elements and articular facets, maximally at L4-5. Discogenic changes nodes with vacuum disc and Schmorl's node formations are maximal L5-S1. Paraspinal and other soft tissues: No paravertebral fluid, swelling, gas or hemorrhage. Limited evaluation of the posterior abdomen and pelvis reveal aortoiliac atherosclerosis without aneurysm or ectasia. Patient appears to be post cholecystectomy. Few noninflamed colonic diverticula are present as well. Disc levels: Level by level evaluation of the lumbar spine below: T11-T12: Moderate disc height loss and vacuum disc phenomenon and minimal discogenic endplate changes. At most mild facet arthrosis, left greater than right. No significant canal stenosis or foraminal narrowing. T12-L1: Minimal disc height loss and mild facet degenerative changes. No significant posterior disc abnormality. No significant spinal canal or foraminal stenosis. L1-L2: Minimal disc height loss and mild  facet degenerative change. No significant posterior disc abnormality. No significant spinal canal or foraminal stenosis. L2-L3: Minimal disc height loss and mild facet degenerative change. Tiny left foraminal zone protrusion. No significant canal stenosis or resulting foraminal stenosis. L3-L4: Mild disc height loss and mild facet degenerative change. Shallow global disc bulge. No significant canal stenosis or foraminal narrowing. L4-L5: Moderate disc height loss with global disc bulge with moderate left and more severe right facet arthropathy. Combination of the disc bulge in synovial thickening results in some mild canal stenosis, partial effacement of the lateral recesses with contact of the traversing L5 nerve roots, and moderate bilateral foraminal narrowing. L5-S1: Severe disc height loss with discogenic changes and moderate bilateral foraminal narrowing. Broad-based posterior disc bulge and some synovial thickening result in mild canal stenosis as well as some minimal contact of the traversing S1 nerve roots and moderate bilateral foraminal narrowing. IMPRESSION: 1. No acute vertebral body fracture or traumatic listhesis is evident. 2. Slight asymmetric widening  of the right L4-5 articular facet is favored to be degenerative given the presence of vacuum phenomenon and degenerative fragmentation at this level. 3. Multilevel degenerative changes of the lumbar spine, maximal at L4-5 and L5-S1. Detailed level by level above. 4. Aortic Atherosclerosis (ICD10-I70.0). Electronically Signed   By: Lovena Le M.D.   On: 02/10/2020 22:05   MR LUMBAR SPINE WO CONTRAST  Result Date: 02/13/2020 CLINICAL DATA:  Lumbar radiculopathy. Nontraumatic right leg pain, predominantly at the lateral aspect of the thigh, with right leg weakness for 4 days. EXAM: MRI LUMBAR SPINE WITHOUT CONTRAST TECHNIQUE: Multiplanar, multisequence MR imaging of the lumbar spine was performed. No intravenous contrast was administered.  COMPARISON:  Lumbar spine radiographs 02/13/2020. Lumbar spine CT 02/10/2020. Lumbar spine MRI 12/19/2013. FINDINGS: Segmentation:  5 lumbar type vertebrae.  Rudimentary S1-2 disc. Alignment:  Borderline anterolisthesis of L4 on L5. Vertebrae: No fracture, significant marrow edema, or evidence of discitis. 9 mm STIR hyperintense lesion in the right L3 pedicle, unchanged from 2015 and therefore likely benign. Mild chronic degenerative endplate changes at L9-J5. Conus medullaris and cauda equina: Conus extends to the L1 level. Conus and cauda equina appear normal. Paraspinal and other soft tissues: Unremarkable. Disc levels: T12-L1 through L2-3: Negative. L3-4: Minimal disc bulging and mild facet and ligamentum flavum hypertrophy without stenosis. L4-5: Disc desiccation and mild disc space narrowing. There is a new right foraminal and subarticular disc extrusion which demonstrates superior migration in the lateral recess to the L4 pedicle level resulting in lateral recess stenosis with L4 nerve root impingement as well as moderate right neural foraminal stenosis. Circumferential disc bulging and severe facet and ligamentum flavum hypertrophy contribute to mild spinal stenosis, mild left lateral recess stenosis, and mild left neural foraminal stenosis. Disc and facet degeneration have progressed from 2015, and the disc extrusion is new. L5-S1: Disc desiccation and moderate disc space narrowing. Disc bulging, endplate spurring, disc space height loss, and mild facet hypertrophy result in mild right and moderate left neural foraminal stenosis without spinal stenosis, unchanged from 2015. IMPRESSION: 1. New L4-5 disc extrusion with right L4 nerve root impingement in the lateral recess. 2. Stable chronic disc and facet degeneration at L5-S1 with mild right and moderate left neural foraminal stenosis. Electronically Signed   By: Logan Bores M.D.   On: 02/13/2020 17:04   DG Hip Unilat W or Wo Pelvis 2-3 Views  Right  Result Date: 02/10/2020 CLINICAL DATA:  Right-sided hip pain EXAM: DG HIP (WITH OR WITHOUT PELVIS) 2-3V RIGHT COMPARISON:  None. FINDINGS: There is no evidence of hip fracture or dislocation. There is no evidence of arthropathy or other focal bone abnormality. IMPRESSION: Negative. Electronically Signed   By: Donavan Foil M.D.   On: 02/10/2020 21:42    Discharge Instructions    Incentive spirometry RT   Complete by: As directed        Follow-up Information    Marybelle Killings, MD Follow up in 1 week(s).   Specialty: Orthopedic Surgery Contact information: [address] [place] [address] [phone]               Discharge Plan:  discharge to home  Disposition:     Signed: Benjiman Core for Rodell Perna MD 03/01/2020, 10:03 AM

## 2020-03-02 ENCOUNTER — Encounter: Payer: Self-pay | Admitting: Surgery

## 2020-03-02 ENCOUNTER — Ambulatory Visit (INDEPENDENT_AMBULATORY_CARE_PROVIDER_SITE_OTHER): Payer: Medicare Other | Admitting: Surgery

## 2020-03-02 ENCOUNTER — Other Ambulatory Visit: Payer: Self-pay

## 2020-03-02 VITALS — BP 171/111 | HR 101 | Temp 98.1°F

## 2020-03-02 DIAGNOSIS — E1165 Type 2 diabetes mellitus with hyperglycemia: Secondary | ICD-10-CM

## 2020-03-02 DIAGNOSIS — I10 Essential (primary) hypertension: Secondary | ICD-10-CM

## 2020-03-02 DIAGNOSIS — Z9889 Other specified postprocedural states: Secondary | ICD-10-CM

## 2020-03-02 MED ORDER — SPIRONOLACTONE 50 MG PO TABS: ORAL_TABLET | ORAL | 0 refills | Status: DC

## 2020-03-02 MED ORDER — PRAVASTATIN SODIUM 80 MG PO TABS: 40.0000 mg | ORAL_TABLET | Freq: Every day | ORAL | 2 refills | Status: DC

## 2020-03-02 MED ORDER — AMLODIPINE BESYLATE 10 MG PO TABS: ORAL_TABLET | ORAL | 0 refills | Status: DC

## 2020-03-02 NOTE — Progress Notes (Signed)
69 year old black female who is almost 2 weeks status post right L4 hemilaminectomy and microdiscectomy returns.  States that she is doing well.  Preop leg symptoms are much improved.  She is getting out and try to walk more using her single prong cane.   Exam Very pleasant female alert and oriented in no acute distress.  Wound looks good.  Staples removed and Steri-Strips applied.  No drainage or signs of infection.  She is neurologically intact.   Plan Patient will gradually increase her walking.  We will avoid bending twisting heavy lifting.  Follow with Dr. Lorin Mercy in 4 weeks for recheck.  All questions answered.

## 2020-03-03 DIAGNOSIS — Z794 Long term (current) use of insulin: Secondary | ICD-10-CM | POA: Diagnosis not present

## 2020-03-03 DIAGNOSIS — E118 Type 2 diabetes mellitus with unspecified complications: Secondary | ICD-10-CM | POA: Diagnosis not present

## 2020-03-15 ENCOUNTER — Ambulatory Visit (INDEPENDENT_AMBULATORY_CARE_PROVIDER_SITE_OTHER): Payer: Medicare Other

## 2020-03-15 ENCOUNTER — Telehealth: Payer: Medicare Other

## 2020-03-15 ENCOUNTER — Other Ambulatory Visit: Payer: Self-pay

## 2020-03-15 DIAGNOSIS — I10 Essential (primary) hypertension: Secondary | ICD-10-CM | POA: Diagnosis not present

## 2020-03-15 DIAGNOSIS — E1165 Type 2 diabetes mellitus with hyperglycemia: Secondary | ICD-10-CM | POA: Diagnosis not present

## 2020-03-15 DIAGNOSIS — E785 Hyperlipidemia, unspecified: Secondary | ICD-10-CM | POA: Diagnosis not present

## 2020-03-16 NOTE — Patient Instructions (Signed)
Visit Information  Goals Addressed      Patient Stated   .  "to fully recover from back surgery" (pt-stated)   On track     [place] (see longitudinal plan of care for additional care plan information)  Current Barriers:  [name] Knowledge Deficits related to disease process and Self Health Management of Herniated lumbar intervertebral disc . Chronic Disease Management support and education needs related to DM, HTN, HLD  Nurse Case Manager Clinical Goal(s):  [name] Over the next 60 days, patient will work with her Orthopedic team to address needs related to status post healing and recovery of right L4 hemiaminectomy, microdiscectomy   CCM RN CM Interventions:  03/17/20 call completed with patient  . Inter-disciplinary care team collaboration (see longitudinal plan of care) . Evaluation of current treatment plan related to s/p right L4 hemiaminectomy, microdiscectomy and patient's adherence to plan as established by provider . Determined patient underwent a hemiaminectomy, microdiscectomy to right L4 on 02/19/20 at [facility] performed by Neurosurgeon Dr. Lorin Mercy, secondary to developing a herniated disc . Discussed and reviewed the following hospital discharge note;  o Hospital Course: [name] is an 69 y.o. female who was admitted 02/19/2020 for operative treatment of lumbar HNP. Patient failed conservative treatments (please see the history and physical for the specifics) and had severe unremitting pain that affects sleep, daily activities and work/hobbies. After pre-op clearance, the patient was taken to the operating room on 02/19/2020 and underwent  Procedure(s): o right L4 hemiaminectomy, microdiscectomy.   o   o Patient was given perioperative antibiotics:  o  o  o  o  o  o  o  o  o  o  o  Anti-infectives (From admission, onward) o   o    o Start o   o   o Dose/Rate o Route o Frequency o Ordered o Stop o    o 02/19/20 1900 o   o ceFAZolin (ANCEF) IVPB 1 g/50 mL premix       o   o 1 g o 100 mL/hr over 30 Minutes o Intravenous o Every 8 hours o 02/19/20 1650 o 02/20/20 0243 o    o     o Patient was given sequential compression devices and early ambulation to prevent DVT. o Patient benefited maximally from hospital stay and there were no complications. At the time of discharge, the patient was urinating/moving their bowels without difficulty, tolerating a regular diet, pain is controlled with oral pain medications and they have been cleared by PT/OT.  [name] Determined patient is recovering well at her home and has caregiver assistance at home, she is ambulating with use of a cane and walker, has all appropriate DME needed and is aware of importance to be active as tolerated to help avoid DVT and or upper respiratory infection  . Determined patient's wound has healed, she is staying well hydrated and has minimal pain, she is adhering to her ADA diet, FBS are stablizing . Discussed plans with patient for ongoing care management follow up and provided patient with direct contact information for care management team . Reviewed scheduled/upcoming provider appointments including: Orthopedics, Neurosurgery and PCP f/u appointments have been scheduled, discussed date/time  Patient Self Care Activities:  . Self administers medications as prescribed . Attends all scheduled provider appointments . Calls pharmacy for medication refills . Calls provider office for new concerns or questions  Initial goal documentation       Patient verbalizes understanding of  instructions provided today.   Telephone follow up appointment with care management team member scheduled for: 05/04/20  [name], RN, BSN, CCM Care Management Coordinator [place] [facility]  Direct Phone: [phone]

## 2020-03-16 NOTE — Chronic Care Management (AMB) (Signed)
Chronic Care Management   Follow Up Note   03/16/2020 Name: [name] MRN: [id number] DOB: 1951/06/29  Referred by: [name], MD Reason for referral : Chronic Care Management (FU RN CM Call )   [name] is a 69 y.o. year old female who is a primary care patient of [name], MD. The CCM team was consulted for assistance with chronic disease management and care coordination needs.    Review of patient status, including review of consultants reports, relevant laboratory and other test results, and collaboration with appropriate care team members and the patient's provider was performed as part of comprehensive patient evaluation and provision of chronic care management services.    SDOH (Social Determinants of Health) assessments performed: Yes - no acute challenges at this time See Care Plan activities for detailed interventions related to [place])   Placed CCM RN CM outbound follow up call to patient.     Outpatient Encounter Medications as of 03/15/2020  Medication Sig  . albuterol (PROAIR HFA) 108 (90 Base) MCG/ACT inhaler 2 puffs every 4 hours as needed only  if your can't catch your breath (Patient not taking: Reported on 02/26/2020)  . AMITIZA 8 MCG capsule TAKE 1 CAPSULE(8 MCG) BY MOUTH DAILY WITH BREAKFAST (Patient taking differently: Take 8 mcg by mouth daily with breakfast. )  . amLODipine (NORVASC) 10 MG tablet TAKE 1 TABLET(10 MG) BY MOUTH AT BEDTIME  . Baclofen 5 MG TABS TAKE 1 TABLET(5MG  TOTAL) BY MOUTH THREE TIMES DAILY AS NEEDED** CAN INCREASE TO TAKE 2 TABLETS(10MG  TOTAL) IF TOLERATE 5MG  (Patient taking differently: Take 5 mg by mouth 3 (three) times daily as needed (pain). CAN INCREASE TO TAKE 2 TABLETS(10MG  TOTAL) IF TOLERATE 5MG )  . Continuous Blood Gluc Receiver (FREESTYLE LIBRE READER) DEVI 1 each by Does not apply route See admin instructions. CGM device-Freestyle Libre  . Continuous Blood Gluc Sensor (FREESTYLE LIBRE 14 DAY SENSOR) MISC 1  each by Does not apply route See admin instructions. CGM 14-day sensors; send refills to [facility] (in [place])   . dapagliflozin propanediol (FARXIGA) 5 MG TABS tablet Take 5 mg by mouth daily before breakfast.  . docusate sodium (COLACE) 100 MG capsule Take 100 mg by mouth daily as needed for mild constipation.   . gabapentin (NEURONTIN) 300 MG capsule TAKE 1 CAPSULE(300 MG) BY MOUTH THREE TIMES DAILY  . HUMALOG 100 UNIT/ML injection INJECT 25 UNITS INTO SKIN WITH BREAFAST AND SUPPER AND 35 UNITS WITH LUNCH (Patient taking differently: Inject 25-35 Units into the skin 3 (three) times daily with meals. Inject 35 units at breakfast, 25 units at lunch and 35 units at supper)  . Lancets Misc. MISC 1 each by Does not apply route 4 (four) times daily -  with meals and at bedtime.  . mirabegron ER (MYRBETRIQ) 50 MG TB24 tablet Take 1 tablet (50 mg total) by mouth daily.  . mometasone-formoterol (DULERA) 100-5 MCG/ACT AERO Inhale 2 puffs into the lungs as needed for wheezing. (Patient not taking: Reported on 02/26/2020)  . Multiple Vitamin (MULTIVITAMIN WITH MINERALS) TABS tablet Take 1 tablet by mouth daily.  . ondansetron (ZOFRAN) 4 MG tablet Take 1 tablet (4 mg total) by mouth every 6 (six) hours as needed for nausea or vomiting.  [name] oxyCODONE-acetaminophen (PERCOCET) 5-325 MG tablet Take 1-2 tablets by mouth every 6 (six) hours as needed for severe pain. Post op pain  . pantoprazole (PROTONIX) 40 MG tablet Take 30- 60 min before your first and last meals  of the day (Patient not taking: Reported on 02/26/2020)  . pravastatin (PRAVACHOL) 80 MG tablet Take 0.5 tablets (40 mg total) by mouth daily.  . predniSONE (DELTASONE) 20 MG tablet 3 po once a day for 2 days, then 2 po once a day for 3 days, then 1 po once a day for 3 days (Patient not taking: Reported on 02/26/2020)  . Semaglutide, 1 MG/DOSE, (OZEMPIC, 1 MG/DOSE,) 2 MG/1.5ML SOPN Inject 1 mg into the skin every Thursday.  [name] spironolactone (ALDACTONE) 50  MG tablet TAKE 1 TABLET(50 MG) BY MOUTH DAILY  . Vitamin D, Ergocalciferol, (DRISDOL) 1.25 MG (50000 UNIT) CAPS capsule Take 1 capsule (50,000 Units total) by mouth every 7 (seven) days. TAKE 1 CAPSULE BY MOUTH EVERY 7 DAYS ON WEDNESDAYS   No facility-administered encounter medications on file as of 03/15/2020.     Objective:  Lab Results  Component Value Date   HGBA1C 5.9 (H) 12/31/2019   HGBA1C 9.0 (H) 10/22/2019   HGBA1C 9.3 (H) 07/22/2019   Lab Results  Component Value Date   MICROALBUR 80 12/31/2019   LDLCALC 98 12/31/2019   CREATININE 1.21 (H) 02/19/2020   BP Readings from Last 3 Encounters:  03/02/20 (!) 171/111  02/20/20 132/66  02/13/20 (!) 165/86    Goals Addressed      Patient Stated   .  "to fully recover from back surgery" (pt-stated)   On track     [place] (see longitudinal plan of care for additional care plan information)  Current Barriers:  [name] Knowledge Deficits related to disease process and Self Health Management of Herniated lumbar intervertebral disc . Chronic Disease Management support and education needs related to DM, HTN, HLD  Nurse Case Manager Clinical Goal(s):  [name] Over the next 60 days, patient will work with her Orthopedic team to address needs related to status post healing and recovery of right L4 hemiaminectomy, microdiscectomy   CCM RN CM Interventions:  03/17/20 call completed with patient  . Inter-disciplinary care team collaboration (see longitudinal plan of care) . Evaluation of current treatment plan related to s/p right L4 hemiaminectomy, microdiscectomy and patient's adherence to plan as established by provider . Determined patient underwent a hemiaminectomy, microdiscectomy to right L4 on 02/19/20 at [facility] performed by Neurosurgeon Dr. Lorin Mercy, secondary to developing a herniated disc . Discussed and reviewed the following hospital discharge note;  o Hospital Course: [name] is an 69 y.o. female who was  admitted 02/19/2020 for operative treatment of lumbar HNP. Patient failed conservative treatments (please see the history and physical for the specifics) and had severe unremitting pain that affects sleep, daily activities and work/hobbies. After pre-op clearance, the patient was taken to the operating room on 02/19/2020 and underwent  Procedure(s): o right L4 hemiaminectomy, microdiscectomy.   o   o Patient was given perioperative antibiotics:  o  o  o  o  o  o  o  o  o  o  o  Anti-infectives (From admission, onward) o   o    o Start o   o   o Dose/Rate o Route o Frequency o Ordered o Stop o    o 02/19/20 1900 o   o ceFAZolin (ANCEF) IVPB 1 g/50 mL premix      o   o 1 g o 100 mL/hr over 30 Minutes o Intravenous o Every 8 hours o 02/19/20 1650 o 02/20/20 0243 o    o     o Patient  was given sequential compression devices and early ambulation to prevent DVT. o Patient benefited maximally from hospital stay and there were no complications. At the time of discharge, the patient was urinating/moving their bowels without difficulty, tolerating a regular diet, pain is controlled with oral pain medications and they have been cleared by PT/OT.  [name] Determined patient is recovering well at her home and has caregiver assistance at home, she is ambulating with use of a cane and walker, has all appropriate DME needed and is aware of importance to be active as tolerated to help avoid DVT and or upper respiratory infection  . Determined patient's wound has healed, she is staying well hydrated and has minimal pain, she is adhering to her ADA diet, FBS are stablizing . Discussed plans with patient for ongoing care management follow up and provided patient with direct contact information for care management team . Reviewed scheduled/upcoming provider appointments including: Orthopedics, Neurosurgery and PCP f/u appointments have been scheduled, discussed date/time  Patient Self Care Activities:  . Self administers  medications as prescribed . Attends all scheduled provider appointments . Calls pharmacy for medication refills . Calls provider office for new concerns or questions  Initial goal documentation        Plan:   Telephone follow up appointment with care management team member scheduled for: 05/04/20  [name], RN, BSN, CCM Care Management Coordinator [facility]  Direct Phone: [phone]

## 2020-03-21 ENCOUNTER — Other Ambulatory Visit: Payer: Self-pay | Admitting: Nurse Practitioner

## 2020-03-21 DIAGNOSIS — E1165 Type 2 diabetes mellitus with hyperglycemia: Secondary | ICD-10-CM

## 2020-03-21 DIAGNOSIS — I10 Essential (primary) hypertension: Secondary | ICD-10-CM

## 2020-03-22 ENCOUNTER — Other Ambulatory Visit: Payer: Self-pay

## 2020-03-22 ENCOUNTER — Encounter: Payer: Self-pay | Admitting: Physical Medicine and Rehabilitation

## 2020-03-22 ENCOUNTER — Ambulatory Visit (INDEPENDENT_AMBULATORY_CARE_PROVIDER_SITE_OTHER): Payer: Medicare Other | Admitting: Physical Medicine and Rehabilitation

## 2020-03-22 VITALS — BP 141/84 | HR 101

## 2020-03-22 DIAGNOSIS — Z9889 Other specified postprocedural states: Secondary | ICD-10-CM

## 2020-03-22 NOTE — Progress Notes (Signed)
  Patient not seen today.  No charge visit.  Has follow-up with Dr. Rodell Perna status post lumbar laminectomy.  It appears that patient was referred for injection and for possible surgery at the same time and ended up having surgery sooner than the injection was completed and it stayed on the schedule.  Numeric Pain Rating Scale and Functional Assessment Average Pain 4   In the last MONTH (on 0-10 scale) has pain interfered with the following?  1. General activity like being  able to carry out your everyday physical activities such as walking, climbing stairs, carrying groceries, or moving a chair?  Rating(9)   +Driver, -BT, -Dye Allergies.

## 2020-03-30 ENCOUNTER — Ambulatory Visit (INDEPENDENT_AMBULATORY_CARE_PROVIDER_SITE_OTHER): Payer: Medicare Other | Admitting: Orthopaedic Surgery

## 2020-03-30 ENCOUNTER — Encounter: Payer: Self-pay | Admitting: Orthopaedic Surgery

## 2020-03-30 ENCOUNTER — Other Ambulatory Visit: Payer: Self-pay

## 2020-03-30 DIAGNOSIS — Z9889 Other specified postprocedural states: Secondary | ICD-10-CM
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
## 2020-04-03 DIAGNOSIS — Z794 Long term (current) use of insulin: Secondary | ICD-10-CM | POA: Diagnosis not present

## 2020-04-03 DIAGNOSIS — E118 Type 2 diabetes mellitus with unspecified complications: Secondary | ICD-10-CM | POA: Diagnosis not present

## 2020-04-07 ENCOUNTER — Other Ambulatory Visit: Payer: Self-pay | Admitting: Nurse Practitioner

## 2020-04-07 ENCOUNTER — Telehealth: Payer: Self-pay

## 2020-04-07 MED ORDER — GABAPENTIN 300 MG PO CAPS: ORAL_CAPSULE | ORAL | 5 refills | Status: DC

## 2020-04-07 NOTE — Chronic Care Management (AMB) (Signed)
Chronic Care Management Pharmacy Assistant   Name: [name]  MRN: [id number] DOB: 20-Jan-1951  Reason for Encounter: Disease State and Medication Review  Patient Questions:  1.  Have you seen any other providers since your last visit? Yes, 03/30/2020- Dr Lorin Mercy (Ortho- Spine), 03/22/2020- Dr Ernestina Patches (PT), 03/02/2020- Dr Ricard Dillon ([place]).   2.  Any changes in your medicines or health? Yes, Lumbar Laminectomy   Reviewed chart for medication changes ahead of medication coordination call.  BP Readings from Last 3 Encounters:  03/30/20 129/81  03/22/20 (!) 141/84  03/02/20 (!) 171/111    Lab Results  Component Value Date   HGBA1C 5.9 (H) 12/31/2019     Patient obtains medications through Adherence Packaging  30 days until all medications are sync   Last adherence delivery included:  03/16/20: Humalog u100- Inject 25 units sq with breakfast and supper, 35 units with lunch, Ozempic 1mg - inject 1 mg sq weekly on Thursday, Farxiga 5 mg- one tablet daily before breakfast.  03/04/20: Women's 50 Plus Multivitamin- 1 tablet daily, Myrbetriq 50 mg- 1 tablet daily, Pravastatin 80 mg- 1/2 tablet daily, Amlodipine 10 mg- 1 tablet daily, Spironolactone 50 mg- 1 tablet daily.  03/01/20: Gabapentin 300 mg- 1 capsule three times daily.  Patient is due for next adherence delivery on: 04/14/2020  Called patient and reviewed medications and coordinated delivery. This delivery to include:Humalog u100- Inject 25 units sq with breakfast and supper, 35 units with lunch, Ozempic 1mg - inject 1 mg sq weekly on Thursday, Farxiga 5 mg- one tablet daily before breakfast, Women's 50 Plus Multivitamin- 1 tablet daily, Myrbetriq 50 mg- 1 tablet daily, Pravastatin 80 mg- 1/2 tablet daily, Spironolactone 50 mg- 1 tablet daily, Amitiza 8 mg- 1 capsule daily with breakfast, Ergocalciferol 50,000 units- 1 capsule weekly on Wednesdays.   Patient declined the following medications: Amlodipine 10 mg due to  recognizing the name of this medication from her last prescription from [place]. Her last prescription said Amlodipine Besylate 10 mg and current prescription did not and she noticed the pills were different color so she wasn't sure if it was the same medication. Patient did not take this medication given, she has not had Amlodipine since 03/03/2020, #32 on hand. Gabapentin 300mg  due to having some left over from last 90 day supply from [facility].   Patient needs refills for:  Humalog u100- Inject 25 units sq with breakfast and supper, 35 units with lunch  Ozempic 1mg - inject 1 mg sq weekly on Thursday Farxiga 5 mg- one tablet daily before breakfast  Women's 50 Plus Multivitamin- 1 tablet daily  Myrbetriq 50 mg- 1 tablet daily  Pravastatin 80 mg- 1/2 tablet daily  Spironolactone 50 mg- 1 tablet daily  Amitiza 8 mg- 1 capsule daily with breakfast  Ergocalciferol 50,000 units- 1 capsule weekly on Wednesdays.   Confirmed delivery date of 04/14/2020, advised patient that pharmacy will contact them the morning of delivery.  PCP : [name], MD  Allergies:   Allergies  Allergen Reactions  . Pollen Extract Other (See Comments)    Congestion/sneezing  . Tomato Hives and Itching  . Codeine Hives, Itching and Nausea And Vomiting    Medications: Outpatient Encounter Medications as of 04/07/2020  Medication Sig  . albuterol (PROAIR HFA) 108 (90 Base) MCG/ACT inhaler 2 puffs every 4 hours as needed only  if your can't catch your breath  . AMITIZA 8 MCG capsule TAKE 1 CAPSULE(8 MCG) BY MOUTH DAILY WITH BREAKFAST (Patient taking  differently: Take 8 mcg by mouth daily with breakfast. )  . amLODipine (NORVASC) 10 MG tablet TAKE 1 TABLET(10 MG) BY MOUTH AT BEDTIME  . Baclofen 5 MG TABS TAKE 1 TABLET(5MG  TOTAL) BY MOUTH THREE TIMES DAILY AS NEEDED** CAN INCREASE TO TAKE 2 TABLETS(10MG  TOTAL) IF TOLERATE 5MG  (Patient taking differently: Take 5 mg by mouth 3 (three) times daily as needed (pain). CAN  INCREASE TO TAKE 2 TABLETS(10MG  TOTAL) IF TOLERATE 5MG )  . Continuous Blood Gluc Receiver (FREESTYLE LIBRE READER) DEVI 1 each by Does not apply route See admin instructions. CGM device-Freestyle Libre  . Continuous Blood Gluc Sensor (FREESTYLE LIBRE 14 DAY SENSOR) MISC 1 each by Does not apply route See admin instructions. CGM 14-day sensors; send refills to [facility] (in [place])   . dapagliflozin propanediol (FARXIGA) 5 MG TABS tablet Take 5 mg by mouth daily before breakfast.  . docusate sodium (COLACE) 100 MG capsule Take 100 mg by mouth daily as needed for mild constipation.   . gabapentin (NEURONTIN) 300 MG capsule TAKE 1 CAPSULE(300 MG) BY MOUTH THREE TIMES DAILY  . HUMALOG 100 UNIT/ML injection INJECT 25 UNITS INTO SKIN WITH BREAFAST AND SUPPER AND 35 UNITS WITH LUNCH (Patient taking differently: Inject 25-35 Units into the skin 3 (three) times daily with meals. Inject 35 units at breakfast, 25 units at lunch and 35 units at supper)  . Lancets Misc. MISC 1 each by Does not apply route 4 (four) times daily -  with meals and at bedtime.  . mirabegron ER (MYRBETRIQ) 50 MG TB24 tablet Take 1 tablet (50 mg total) by mouth daily.  . mometasone-formoterol (DULERA) 100-5 MCG/ACT AERO Inhale 2 puffs into the lungs as needed for wheezing.   . Multiple Vitamin (MULTIVITAMIN WITH MINERALS) TABS tablet Take 1 tablet by mouth daily.  . ondansetron (ZOFRAN) 4 MG tablet Take 1 tablet (4 mg total) by mouth every 6 (six) hours as needed for nausea or vomiting.  [name] oxyCODONE-acetaminophen (PERCOCET) 5-325 MG tablet Take 1-2 tablets by mouth every 6 (six) hours as needed for severe pain. Post op pain  . pantoprazole (PROTONIX) 40 MG tablet Take 30- 60 min before your first and last meals of the day  . pravastatin (PRAVACHOL) 80 MG tablet Take 0.5 tablets (40 mg total) by mouth daily.  . predniSONE (DELTASONE) 20 MG tablet 3 po once a day for 2 days, then 2 po once a day for 3 days, then 1 po once a day for 3  days  . Semaglutide, 1 MG/DOSE, (OZEMPIC, 1 MG/DOSE,) 2 MG/1.5ML SOPN Inject 1 mg into the skin every Thursday.  [name] spironolactone (ALDACTONE) 50 MG tablet TAKE 1 TABLET(50 MG) BY MOUTH DAILY  . Vitamin D, Ergocalciferol, (DRISDOL) 1.25 MG (50000 UNIT) CAPS capsule Take 1 capsule (50,000 Units total) by mouth every 7 (seven) days. TAKE 1 CAPSULE BY MOUTH EVERY 7 DAYS ON WEDNESDAYS   No facility-administered encounter medications on file as of 04/07/2020.    Current Diagnosis: Patient Active Problem List   Diagnosis Date Noted  . S/P lumbar laminectomy 03/30/2020  . Syncope due to orthostatic hypotension 05/28/2019  . Hypertensive urgency 04/06/2019  . 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  . Wound infection 03/06/2019  . Localized swelling, mass and lump, multiple sites 03/27/2018  . Type II diabetes mellitus with renal manifestations ([place]) 12/27/2017  . HLD (hyperlipidemia) 12/27/2017  . HTN (hypertension) 12/27/2017  . CKD (chronic kidney disease), stage III 12/27/2017  . Syncope 12/27/2017  . Cough variant asthma 12/01/2015  .  Osteoarthritis of left knee, primary localized 08/17/2014  . Knee osteoarthritis 08/17/2014  . Insulin pump in place 12/21/2013  . Rhabdomyolysis 11/15/2013  . Elevated lactic acid level 11/15/2013  . Hypoglycemia 11/15/2013  . Other and unspecified hyperlipidemia 08/06/2013  . Type II diabetes mellitus, uncontrolled ([place]) 07/20/2013  . Varicose veins of lower extremities with other complications 45/10/8880  . Fibromyalgia   . GERD 05/18/2010  . URINARY TRACT INFECTION, MONILIAL 05/05/2010  . UTI 05/02/2010  . ABDOMINAL PAIN, GENERALIZED 05/02/2010  . OBESITY 04/20/2010  . BURSITIS, LEFT SHOULDER 03/09/2010  . CANDIDIASIS OF VULVA AND VAGINA 01/10/2010  . Lipoma of arm s/p excision 01/29/2014 01/10/2010  . DEPRESSION 12/27/2009  . BACK PAIN WITH RADICULOPATHY 12/27/2009  . CYSTITIS, ACUTE 10/21/2009  . MICROSCOPIC HEMATURIA 10/18/2009  . KNEE PAIN, BILATERAL  10/18/2009  . NEUROPATHY 09/08/2009  . HYPERTENSION, BENIGN ESSENTIAL 09/08/2009  . Asthma 09/08/2009  . STRESS INCONTINENCE 09/08/2009  . CHEST PAIN 07/19/2009  . ESOPHAGEAL STRICTURE 08/27/2005  . Diabetes mellitus without complication ([place]) 80/10/4915   Recent Relevant Labs: Lab Results  Component Value Date/Time   HGBA1C 5.9 (H) 12/31/2019 04:11 PM   HGBA1C 9.0 (H) 10/22/2019 04:03 PM   MICROALBUR 80 12/31/2019 03:19 PM   MICROALBUR 6.9 (H) 09/07/2016 01:41 PM   MICROALBUR 7.2 (H) 08/30/2015 01:59 PM    Kidney Function Lab Results  Component Value Date/Time   CREATININE 1.21 (H) 02/19/2020 09:51 AM   CREATININE 1.30 (H) 02/10/2020 09:32 PM   GFR 51.07 (L) 05/17/2017 08:54 AM   GFRNONAA 46 (L) 02/19/2020 09:51 AM   GFRAA 54 (L) 02/19/2020 09:51 AM    . Current antihyperglycemic regimen:   Humalog u100- Inject 25 units sq with breakfast and supper, 35 units with lunch,  Ozempic 1mg - inject 1 mg sq weekly on Thursday, Farxiga 5 mg- one tablet daily  before breakfast.  . What recent interventions/DTPs have been made to improve glycemic control:  o Patient states she is trying to eat better and has incorporated water everyday, she now may drink 4-5 4 oz cups of water a day. . Have there been any recent hospitalizations or ED visits since last visit with CPP? No . Patient reports hypoglycemic symptoms, including Shaky, Nervous/irritable and Vision changes . Patient reports hyperglycemic symptoms, including blurry vision, excessive thirst, fatigue, polyuria and weakness . How often are you checking your blood sugar? 3-4 times daily- Patient uses the [facility] and may check up to 6 times a day.  . What are your blood sugars ranging?  o Fasting: 90 o Before meals: 236-268 o After meals: 242 o Bedtime: 160-200 . During the week, how often does your blood glucose drop below 70? Never- Not recently but has happened 3-4 weeks ago.  Patient states she is not sure whether blood  sugars are high or low because symptoms listed could be for either/or. Patient does keep peanut butter crackers, glucose tablets and similar snacks with her in case blood sugars drop too low. Patient has a very high reading one day that did not show a number on her glucose monitor, just said HIGH.   Adherence Review: Is the patient currently on a STATIN medication? Yes- Pravastatin 80 mg Is the patient currently on ACE/ARB medication? No Does the patient have >5 day gap between last estimated fill dates? Yes due to not taking medications received at last delivery because patient was confused about the name of Amlodipine listed on her bottle.   Juliane Poot  Follow-Up:  Coordination of Enhanced Pharmacy Services and Pharmacist Review- Patient has a follow up CCM visit with Pharmacist on 04/21/2020.   [name], [place] Pharmacist Assistant [phone]

## 2020-04-21 ENCOUNTER — Telehealth: Payer: Self-pay

## 2020-05-03 ENCOUNTER — Encounter: Payer: Self-pay | Admitting: Nurse Practitioner

## 2020-05-03 ENCOUNTER — Ambulatory Visit (INDEPENDENT_AMBULATORY_CARE_PROVIDER_SITE_OTHER): Payer: Medicare Other | Admitting: Nurse Practitioner

## 2020-05-03 ENCOUNTER — Other Ambulatory Visit: Payer: Self-pay

## 2020-05-03 VITALS — BP 140/76 | HR 95 | Temp 98.1°F | Ht 64.0 in | Wt 145.0 lb

## 2020-05-03 DIAGNOSIS — R5383 Other fatigue: Secondary | ICD-10-CM | POA: Diagnosis not present

## 2020-05-03 DIAGNOSIS — Z23 Encounter for immunization: Secondary | ICD-10-CM

## 2020-05-03 DIAGNOSIS — Z79899 Other long term (current) drug therapy: Secondary | ICD-10-CM | POA: Diagnosis not present

## 2020-05-03 DIAGNOSIS — I10 Essential (primary) hypertension: Secondary | ICD-10-CM

## 2020-05-03 DIAGNOSIS — E559 Vitamin D deficiency, unspecified: Secondary | ICD-10-CM

## 2020-05-03 DIAGNOSIS — E1165 Type 2 diabetes mellitus with hyperglycemia: Secondary | ICD-10-CM | POA: Diagnosis not present

## 2020-05-03 NOTE — Progress Notes (Signed)
This visit occurred during the SARS-CoV-2 public health emergency.  Safety protocols were in place, including screening questions prior to the visit, additional usage of staff PPE, and extensive cleaning of exam room while observing appropriate contact time as indicated for disinfecting solutions.  Subjective:     Patient ID: [name] , female    DOB: 16-Jan-1951 , 69 y.o.   MRN: [id number]   Chief Complaint  Patient presents with   Diabetes   Hypertension   Hyperlipidemia    HPI   She is here for 3 month diabetes follow up. She is using the [employer].   Diabetes She presents for her follow-up diabetic visit. She has type 2 diabetes mellitus. There are no hypoglycemic associated symptoms. Pertinent negatives for hypoglycemia include no headaches. Pertinent negatives for diabetes include no blurred vision, no chest pain, no polydipsia, no polyphagia and no polyuria. There are no hypoglycemic complications. There are no diabetic complications. Risk factors for coronary artery disease include sedentary lifestyle. Current diabetic treatment includes oral agent (triple therapy). She is compliant with treatment all of the time. She is following a generally unhealthy diet. When asked about meal planning, she reported none. She has not had a previous visit with a dietitian. She rarely participates in exercise. There is no change in her home blood glucose trend. (She is using the freestyle libre) She does not see a podiatrist.Eye exam is current.  Hypertension This is a chronic problem. The current episode started more than 1 year ago. The problem is unchanged. The problem is controlled. Pertinent negatives include no anxiety, blurred vision, chest pain, headaches or palpitations. There are no associated agents to hypertension. Risk factors for coronary artery disease include diabetes mellitus and sedentary lifestyle. There are no compliance problems.  There is no history of angina.  There is no history of chronic renal disease.  Hyperlipidemia This is a chronic problem. The current episode started more than 1 year ago. The problem is controlled. Exacerbating diseases include diabetes. She has no history of chronic renal disease or obesity. Pertinent negatives include no chest pain. The current treatment provides moderate improvement of lipids. There are no compliance problems.  Risk factors for coronary artery disease include diabetes mellitus, dyslipidemia and post-menopausal.     Past Medical History:  Diagnosis Date   Aortic stenosis    mild on 04/07/19 echo   Arthritis    L knee, hands, back    Asthma    Diabetes mellitus type 2, insulin dependent (HCC)    Fibromyalgia    Full dentures    Full dentures    GERD (gastroesophageal reflux disease)    H/O hiatal hernia    Headache    h/o migraines - was followed for a time with a wellness doctor   History of esophageal dilatation    History of rhabdomyolysis    03/ 2015   Hyperlipidemia    Hypertension    Insulin pump in place    since 12/ 2014--  [facility]   Lumbar disc herniation    right leg weakness   Osteoarthritis of left knee, primary localized 08/17/2014   Renal insufficiency    SUI (stress urinary incontinence, female)    Wears glasses    Wears glasses      Family History  Problem Relation Age of Onset   Diabetes Mother    Hypertension Mother    Lung cancer Mother        smoked   Diabetes Father  Hypertension Father    Lung cancer Father        smoked   Diabetes Brother    Heart failure Sister    Diabetes Maternal Grandmother    Cancer Maternal Grandmother      Current Outpatient Medications:    AMITIZA 8 MCG capsule, TAKE 1 CAPSULE(8 MCG) BY MOUTH DAILY WITH BREAKFAST (Patient taking differently: Take 8 mcg by mouth daily with breakfast. ), Disp: 90 capsule, Rfl: 1   amLODipine (NORVASC) 10 MG tablet, TAKE 1 TABLET(10 MG) BY MOUTH AT BEDTIME, Disp:  90 tablet, Rfl: 0   Continuous Blood Gluc Receiver (FREESTYLE LIBRE READER) DEVI, 1 each by Does not apply route See admin instructions. CGM device-Freestyle Libre, Disp: , Rfl:    Continuous Blood Gluc Sensor (FREESTYLE LIBRE 14 DAY SENSOR) MISC, 1 each by Does not apply route See admin instructions. CGM 14-day sensors; send refills to [facility] (in [place]) , Disp: , Rfl:    dapagliflozin propanediol (FARXIGA) 5 MG TABS tablet, Take 5 mg by mouth daily before breakfast., Disp: 90 tablet, Rfl: 1   docusate sodium (COLACE) 100 MG capsule, Take 100 mg by mouth daily as needed for mild constipation. , Disp: , Rfl:    gabapentin (NEURONTIN) 300 MG capsule, TAKE 1 CAPSULE(300 MG) BY MOUTH THREE TIMES DAILY, Disp: 90 capsule, Rfl: 5   HUMALOG 100 UNIT/ML injection, INJECT 25 UNITS INTO SKIN WITH BREAFAST AND SUPPER AND 35 UNITS WITH LUNCH (Patient taking differently: Inject 25-35 Units into the skin 3 (three) times daily with meals. Inject 35 units at breakfast, 25 units at lunch and 35 units at supper), Disp: 40 mL, Rfl: 2   Lancets Misc. MISC, 1 each by Does not apply route 4 (four) times daily -  with meals and at bedtime., Disp: 300 each, Rfl: 3   mirabegron ER (MYRBETRIQ) 50 MG TB24 tablet, Take 1 tablet (50 mg total) by mouth daily., Disp: 90 tablet, Rfl: 0   Multiple Vitamin (MULTIVITAMIN WITH MINERALS) TABS tablet, Take 1 tablet by mouth daily., Disp: , Rfl:    ondansetron (ZOFRAN) 4 MG tablet, Take 1 tablet (4 mg total) by mouth every 6 (six) hours as needed for nausea or vomiting., Disp: 20 tablet, Rfl: 0   pantoprazole (PROTONIX) 40 MG tablet, Take 30- 60 min before your first and last meals of the day, Disp: 60 tablet, Rfl: 11   pravastatin (PRAVACHOL) 80 MG tablet, Take 0.5 tablets (40 mg total) by mouth daily., Disp: 90 tablet, Rfl: 2   Semaglutide, 1 MG/DOSE, (OZEMPIC, 1 MG/DOSE,) 2 MG/1.5ML SOPN, Inject 1 mg into the skin every Thursday., Disp: 3 pen, Rfl: 3    spironolactone (ALDACTONE) 50 MG tablet, TAKE 1 TABLET(50 MG) BY MOUTH DAILY, Disp: 90 tablet, Rfl: 0   Vitamin D, Ergocalciferol, (DRISDOL) 1.25 MG (50000 UNIT) CAPS capsule, Take 1 capsule (50,000 Units total) by mouth every 7 (seven) days. TAKE 1 CAPSULE BY MOUTH EVERY 7 DAYS ON WEDNESDAYS, Disp: 15 capsule, Rfl: 1   mometasone-formoterol (DULERA) 100-5 MCG/ACT AERO, Inhale 2 puffs into the lungs as needed for wheezing.  (Patient not taking: Reported on 05/03/2020), Disp: , Rfl:    Allergies  Allergen Reactions   Pollen Extract Other (See Comments)    Congestion/sneezing   Tomato Hives and Itching   Codeine Hives, Itching and Nausea And Vomiting     Review of Systems  Constitutional: Negative.   Eyes: Negative for blurred vision.  Respiratory: Negative.  Negative for cough.  Cardiovascular: Negative for chest pain, palpitations and leg swelling.  Endocrine: Negative for polydipsia, polyphagia and polyuria.  Neurological: Negative.  Negative for headaches.  Psychiatric/Behavioral: Negative.      Today's Vitals   05/03/20 1413  BP: 140/76  Pulse: 95  Temp: 98.1 F (36.7 C)  TempSrc: Oral  Weight: 145 lb (65.8 kg)  Height: 5' 4"  (1.626 m)  PainSc: 0-No pain   Body mass index is 24.89 kg/m.   Objective:  Physical Exam Vitals reviewed.  Constitutional:      Appearance: Normal appearance.  Cardiovascular:     Rate and Rhythm: Normal rate and regular rhythm.     Pulses: Normal pulses.     Heart sounds: Normal heart sounds. No murmur heard.   Pulmonary:     Effort: Pulmonary effort is normal. No respiratory distress.     Breath sounds: Normal breath sounds.  Abdominal:     General: Bowel sounds are normal. There is no distension.     Palpations: Abdomen is soft.     Tenderness: There is no abdominal tenderness.  Neurological:     General: No focal deficit present.     Mental Status: She is alert and oriented to person, place, and time.     Cranial Nerves: No  cranial nerve deficit.  Psychiatric:        Mood and Affect: Mood normal.        Behavior: Behavior normal.        Thought Content: Thought content normal.        Judgment: Judgment normal.         Assessment And Plan:     1. Essential hypertension  Chronic, fair control  Continue with current medications.  - CMP14+EGFR  2. Uncontrolled type 2 diabetes mellitus with hyperglycemia (HCC)  Continues to have elevated blood sugars she is on Humalog only, I will likely add Tresiba to her regimen to give her better control. Until I advise her to do so she is to use the sliding scale in addition to her daily dose of insulin with Humalog.  I have requested the records from her opthalmologist - Hemoglobin A1c  3. Fatigue, unspecified type  Continues to complain of fatigue  Will check for metabolic cause - Vitamin X73  4. Vitamin D deficiency  Will check vitamin D level and supplement as needed.     Also encouraged to spend 15 minutes in the sun daily.  - VITAMIN D 25 Hydroxy (Vit-D Deficiency, Fractures)        [name], FNP    THE PATIENT IS ENCOURAGED TO PRACTICE SOCIAL DISTANCING DUE TO THE COVID-19 PANDEMIC.

## 2020-05-03 NOTE — Patient Instructions (Signed)
Use the sliding scale insulin for meal time blood sugars.  I have given you the form already in the room.  We will likely start you on a long acting insulin to help with your blood sugars.

## 2020-05-04 ENCOUNTER — Telehealth: Payer: Medicare Other

## 2020-05-04 ENCOUNTER — Ambulatory Visit: Payer: Self-pay

## 2020-05-04 DIAGNOSIS — Z794 Long term (current) use of insulin: Secondary | ICD-10-CM | POA: Diagnosis not present

## 2020-05-04 DIAGNOSIS — E1165 Type 2 diabetes mellitus with hyperglycemia: Secondary | ICD-10-CM

## 2020-05-04 DIAGNOSIS — E118 Type 2 diabetes mellitus with unspecified complications: Secondary | ICD-10-CM | POA: Diagnosis not present

## 2020-05-04 DIAGNOSIS — I10 Essential (primary) hypertension: Secondary | ICD-10-CM

## 2020-05-04 DIAGNOSIS — E785 Hyperlipidemia, unspecified: Secondary | ICD-10-CM

## 2020-05-04 LAB — CMP14+EGFR
ALT: 11 IU/L (ref 0–32)
AST: 12 IU/L (ref 0–40)
Albumin/Globulin Ratio: 1.3 (ref 1.2–2.2)
Albumin: 4 g/dL (ref 3.8–4.8)
Alkaline Phosphatase: 130 IU/L — ABNORMAL HIGH (ref 48–121)
BUN/Creatinine Ratio: 7 — ABNORMAL LOW (ref 12–28)
BUN: 9 mg/dL (ref 8–27)
Bilirubin Total: 0.7 mg/dL (ref 0.0–1.2)
CO2: 27 mmol/L (ref 20–29)
Calcium: 9.8 mg/dL (ref 8.7–10.3)
Chloride: 95 mmol/L — ABNORMAL LOW (ref 96–106)
Creatinine, Ser: 1.24 mg/dL — ABNORMAL HIGH (ref 0.57–1.00)
GFR calc Af Amer: 52 mL/min/{1.73_m2} — ABNORMAL LOW (ref >59)
GFR calc non Af Amer: 45 mL/min/{1.73_m2} — ABNORMAL LOW (ref >59)
Globulin, Total: 3.1 g/dL (ref 1.5–4.5)
Glucose: 357 mg/dL — ABNORMAL HIGH (ref 65–99)
Potassium: 4.6 mmol/L (ref 3.5–5.2)
Sodium: 135 mmol/L (ref 134–144)
Total Protein: 7.1 g/dL (ref 6.0–8.5)

## 2020-05-04 LAB — HEMOGLOBIN A1C
Est. average glucose Bld gHb Est-mCnc: 280 mg/dL
Hgb A1c MFr Bld: 11.4 % — ABNORMAL HIGH (ref 4.8–5.6)

## 2020-05-04 LAB — VITAMIN D 25 HYDROXY (VIT D DEFICIENCY, FRACTURES): Vit D, 25-Hydroxy: 34.4 ng/mL (ref 30.0–100.0)

## 2020-05-04 LAB — VITAMIN B12: Vitamin B-12: 468 pg/mL (ref 232–1245)

## 2020-05-05 NOTE — Patient Instructions (Addendum)
Visit Information  Goals Addressed      Patient Stated   .  "to keep my A1c within normal range" (pt-stated)         . CARE PLAN ENTRY . (see longitudinal plan of care for additional care plan information) Current Barriers:  [name] Knowledge Deficits related to disease process and Self Health management of DM . Chronic Disease Management support and education needs related to DM, HTN, HLD   Nurse Case Manager Clinical Goal(s):  [name] Over the next 180 days, patient will work with the CCM team to address needs related to disease education and support to help improve patient's Self Health Management of her DM as evidence by patient will maintain an A1c <7.0 %  CCM RN CM Interventions:  05/04/20 call completed with patient  . Inter-disciplinary care team collaboration (see longitudinal plan of care) . Evaluation of current treatment plan related to DM and patient's adherence to plan as established by provider . Reviewed and discussed current A1c has increased to 11.4 %; Re-educated on target A1c <7.0; Re-educated on dietary and exercise recommendations; . Re-educated on daily glycemic target ranges, FBS 80-130, <180 after meals  . Determined patient continues to have the free style Libre Scanner to help monitor her sugars, discussed average FBS runs in the low 100's . Determined patient is adhering to her DM medication regimen as prescribed by PCP, current regimen:  o dapagliflozin propanediol (FARXIGA) 5 MG TABS tablet, Take 5 mg by mouth daily before breakfast o HUMALOG 100 UNIT/ML injection, INJECT 25 UNITS INTO SKIN WITH BREAFAST AND SUPPER AND 35 UNITS WITH LUNCH (Patient taking differently: Inject 25-35 Units into the skin 3 (three) times daily with meals. Inject 35 units at breakfast, 25 units at lunch and 35 units at supper),  o Semaglutide, 1 MG/DOSE, (OZEMPIC, 1 MG/DOSE,) 2 MG/1.5ML SOPN, Inject 1 mg into the skin every Thursday . Determined patient is established with the embedded Pharm D  Jannette Fogo, discussed and reviewed next scheduled phone visit with Pharm D is scheduled for 05/11/20 . Discussed plans with patient for ongoing care management follow up and provided patient with direct contact information for care management team . Provided patient with printed educational materials related to Diabetes Management using Meal Planning; Preventing Complications from Diabetes; Grocery Shopping with Diabetes; Diabetes Care Schedule  Patient Self Care Activities:  . Self administers medications as prescribed . Attends all scheduled provider appointments . Calls pharmacy for medication refills . Performs ADL's independently . Performs IADL's independently . Calls provider office for new concerns or questions  Please see past updates related to this goal by clicking on the "Past Updates" button in the selected goal        Patient verbalizes understanding of instructions provided today.   Telephone follow up appointment with care management team member scheduled for: 06/15/20  [name], RN, BSN, CCM Care Management Coordinator [facility]  Direct Phone: [phone]

## 2020-05-05 NOTE — Chronic Care Management (AMB) (Signed)
Chronic Care Management   Follow Up Note   05/04/2020 Name: SALIYAH GILLIN MRN: [id number] DOB: Mar 14, 1951  Referred by: [name], MD Reason for referral : Chronic Care Management (FU RN CM Call )   MYCAH MCDOUGALL is a 69 y.o. year old female who is a primary care patient of [name], MD. The CCM team was consulted for assistance with chronic disease management and care coordination needs.    Review of patient status, including review of consultants reports, relevant laboratory and other test results, and collaboration with appropriate care team members and the patient's provider was performed as part of comprehensive patient evaluation and provision of chronic care management services.    SDOH (Social Determinants of Health) assessments performed: Yes - no acute challenges identified  See Care Plan activities for detailed interventions related to [place])   Placed outbound call to patient for a CCM RN CM update.     Outpatient Encounter Medications as of 05/04/2020  Medication Sig  . AMITIZA 8 MCG capsule TAKE 1 CAPSULE(8 MCG) BY MOUTH DAILY WITH BREAKFAST (Patient taking differently: Take 8 mcg by mouth daily with breakfast. )  . amLODipine (NORVASC) 10 MG tablet TAKE 1 TABLET(10 MG) BY MOUTH AT BEDTIME  . Continuous Blood Gluc Receiver (FREESTYLE LIBRE READER) DEVI 1 each by Does not apply route See admin instructions. CGM device-Freestyle Libre  . Continuous Blood Gluc Sensor (FREESTYLE LIBRE 14 DAY SENSOR) MISC 1 each by Does not apply route See admin instructions. CGM 14-day sensors; send refills to [facility] (in [place])   . dapagliflozin propanediol (FARXIGA) 5 MG TABS tablet Take 5 mg by mouth daily before breakfast.  . docusate sodium (COLACE) 100 MG capsule Take 100 mg by mouth daily as needed for mild constipation.   . gabapentin (NEURONTIN) 300 MG capsule TAKE 1 CAPSULE(300 MG) BY MOUTH THREE TIMES DAILY  . HUMALOG 100 UNIT/ML injection INJECT 25 UNITS  INTO SKIN WITH BREAFAST AND SUPPER AND 35 UNITS WITH LUNCH (Patient taking differently: Inject 25-35 Units into the skin 3 (three) times daily with meals. Inject 35 units at breakfast, 25 units at lunch and 35 units at supper)  . Lancets Misc. MISC 1 each by Does not apply route 4 (four) times daily -  with meals and at bedtime.  . mirabegron ER (MYRBETRIQ) 50 MG TB24 tablet Take 1 tablet (50 mg total) by mouth daily.  . mometasone-formoterol (DULERA) 100-5 MCG/ACT AERO Inhale 2 puffs into the lungs as needed for wheezing.  (Patient not taking: Reported on 05/03/2020)  . Multiple Vitamin (MULTIVITAMIN WITH MINERALS) TABS tablet Take 1 tablet by mouth daily.  . ondansetron (ZOFRAN) 4 MG tablet Take 1 tablet (4 mg total) by mouth every 6 (six) hours as needed for nausea or vomiting.  . pantoprazole (PROTONIX) 40 MG tablet Take 30- 60 min before your first and last meals of the day  . pravastatin (PRAVACHOL) 80 MG tablet Take 0.5 tablets (40 mg total) by mouth daily.  . Semaglutide, 1 MG/DOSE, (OZEMPIC, 1 MG/DOSE,) 2 MG/1.5ML SOPN Inject 1 mg into the skin every Thursday.  [name] spironolactone (ALDACTONE) 50 MG tablet TAKE 1 TABLET(50 MG) BY MOUTH DAILY  . Vitamin D, Ergocalciferol, (DRISDOL) 1.25 MG (50000 UNIT) CAPS capsule Take 1 capsule (50,000 Units total) by mouth every 7 (seven) days. TAKE 1 CAPSULE BY MOUTH EVERY 7 DAYS ON WEDNESDAYS   No facility-administered encounter medications on file as of 05/04/2020.     Objective:  Lab Results  Component Value Date   HGBA1C 11.4 (H) 05/03/2020   HGBA1C 5.9 (H) 12/31/2019   HGBA1C 9.0 (H) 10/22/2019   Lab Results  Component Value Date   MICROALBUR 80 12/31/2019   LDLCALC 98 12/31/2019   CREATININE 1.24 (H) 05/03/2020   BP Readings from Last 3 Encounters:  05/03/20 140/76  03/30/20 129/81  03/22/20 (!) 141/84    Goals Addressed      Patient Stated   .  "to keep my A1c within normal range" (pt-stated)         . CARE PLAN ENTRY . (see  longitudinal plan of care for additional care plan information) Current Barriers:  [name] Knowledge Deficits related to disease process and Self Health management of DM . Chronic Disease Management support and education needs related to DM, HTN, HLD   Nurse Case Manager Clinical Goal(s):  [name] Over the next 180 days, patient will work with the CCM team to address needs related to disease education and support to help improve patient's Self Health Management of her DM as evidence by patient will maintain an A1c <7.0 %  CCM RN CM Interventions:  05/04/20 call completed with patient  . Inter-disciplinary care team collaboration (see longitudinal plan of care) . Evaluation of current treatment plan related to DM and patient's adherence to plan as established by provider . Reviewed and discussed current A1c has increased to 11.4 %; Re-educated on target A1c <7.0; Re-educated on dietary and exercise recommendations; . Re-educated on daily glycemic target ranges, FBS 80-130, <180 after meals  . Determined patient continues to have the free style Libre Scanner to help monitor her sugars, discussed average FBS runs in the low 100's . Determined patient is adhering to her DM medication regimen as prescribed by PCP, current regimen:  o dapagliflozin propanediol (FARXIGA) 5 MG TABS tablet, Take 5 mg by mouth daily before breakfast o HUMALOG 100 UNIT/ML injection, INJECT 25 UNITS INTO SKIN WITH BREAFAST AND SUPPER AND 35 UNITS WITH LUNCH (Patient taking differently: Inject 25-35 Units into the skin 3 (three) times daily with meals. Inject 35 units at breakfast, 25 units at lunch and 35 units at supper),  o Semaglutide, 1 MG/DOSE, (OZEMPIC, 1 MG/DOSE,) 2 MG/1.5ML SOPN, Inject 1 mg into the skin every Thursday . Determined patient is established with the embedded Pharm D Jannette Fogo, discussed and reviewed next scheduled phone visit with Pharm D is scheduled for 05/11/20 . Discussed plans with patient for ongoing  care management follow up and provided patient with direct contact information for care management team . Provided patient with printed educational materials related to Diabetes Management using Meal Planning; Preventing Complications from Diabetes; Grocery Shopping with Diabetes; Diabetes Care Schedule  Patient Self Care Activities:  . Self administers medications as prescribed . Attends all scheduled provider appointments . Calls pharmacy for medication refills . Performs ADL's independently . Performs IADL's independently . Calls provider office for new concerns or questions  Please see past updates related to this goal by clicking on the "Past Updates" button in the selected goal        Plan:   Telephone follow up appointment with care management team member scheduled for: 06/15/20  [name], RN, BSN, CCM Care Management Coordinator [facility]  Direct Phone: [phone]

## 2020-05-06 ENCOUNTER — Telehealth: Payer: Self-pay

## 2020-05-06 ENCOUNTER — Other Ambulatory Visit: Payer: Self-pay

## 2020-05-06 DIAGNOSIS — E1165 Type 2 diabetes mellitus with hyperglycemia: Secondary | ICD-10-CM

## 2020-05-06 MED ORDER — OZEMPIC (1 MG/DOSE) 2 MG/1.5ML ~~LOC~~ SOPN: PEN_INJECTOR | SUBCUTANEOUS | 1 refills | Status: DC

## 2020-05-06 NOTE — Chronic Care Management (AMB) (Addendum)
Chronic Care Management Pharmacy Assistant   Name: [name]  MRN: [id number] DOB: 12/19/1950  Reason for Encounter: Medication Review/ Monthly Medication Dispensing Call.  PCP : [name], MD    Reviewed chart for medication changes ahead of medication coordination call.   OVs and Consults- 03/30/2020- Dr Lorin Mercy (Ortho- Spine), 03/22/2020- Dr Ernestina Patches (PT), 03/02/2020- Dr Ricard Dillon ([facility]th[facility]), 05/03/2020- [name], FNP (PCP), 05/04/2020- [name], RN (CCM Nurse) since last care coordination call with Pharmacist  Jannette Fogo, CPP on 02/26/2020.  Medication changes indicated on 05/03/2020- Albuterol Sulfate 108 mcg, Baclofen 5 mg, Oxycodone- Acetaminophen 5/325 mg and Prednisone 20 mg were discontinued by [name], FNP (PCP).  BP Readings from Last 3 Encounters:  05/03/20 140/76  03/30/20 129/81  03/22/20 (!) 141/84    Lab Results  Component Value Date   HGBA1C 11.4 (H) 05/03/2020     Patient obtains medications through Adherence Packaging  30 Days - Would like a 90 day supply, would like pricing first.  Last adherence delivery included: Humalog u100- Inject 25 units sq with breakfast and supper, 35 units with lunch,  Ozempic 1mg - inject 1 mg sq weekly on Thursday, Farxiga 5 mg- one tablet daily before breakfast, Women's 50 Plus Multivitamin- 1 tablet daily, Myrbetriq 50 mg- 1 tablet daily, Pravastatin 80 mg- 1/2 tablet daily, Spironolactone 50 mg- 1 tablet daily, Amitiza 8 mg- 1 capsule daily with breakfast, Ergocalciferol 50,000 units- 1 capsule weekly on Wednesdays.  Patient declined Amlodipine 10 mg last month due to not recognizing the name of this medication from her last prescription from [place]. Her last prescription said Amlodipine Besylate 10 mg and current prescription did not and she noticed the pills were different color so she wasn't sure if it was the same medication. Patient did not take this medication when last delivered, she has not  had Amlodipine since 03/03/2020, #32 on hand. Patient also declined Gabapentin 300mg  last month due to having some left over from last 90 day supply from [facility].   Patient is due for next adherence delivery on: 05/14/2020 but will be delivered on 05/13/2020.  Called patient and reviewed medications and coordinated delivery.  This delivery to include: Humalog u100- Inject 25 units sq with breakfast and supper, 35 units with lunch,  Ozempic 1mg - inject 1 mg sq weekly on Thursday, Farxiga 5 mg- one tablet daily before breakfast, Women's 50 Plus Multivitamin- 1 tablet daily, Myrbetriq 50 mg- 1 tablet daily, Pravastatin 80 mg- 1/2 tablet daily, Spironolactone 50 mg- 1 tablet daily, Amitiza 8 mg- 1 capsule daily with breakfast, Ergocalciferol 50,000 units- 1 capsule weekly on Wednesdays, Amlodipine 10 mg -1 tablet daily.  Patient will not need a short fill of any medications prior to adherence delivery.  Patient declined the following medications Gabapentin 300 mg due to having enough to last for another 60 days, using all of [facility] supply received in July.  Patient needs refills for Humalog u100- Inject 25 units sq with breakfast and supper, 35 units with lunch,  Ozempic 1 mg- inject 1 mg sq weekly on Thursday, Farxiga 5 mg- one tablet daily before breakfast, Women's 50 Plus Multivitamin- 1 tablet daily, Myrbetriq 50 mg- 1 tablet daily, Pravastatin 80 mg- 1/2 tablet daily, Spironolactone 50 mg- 1 tablet daily, Amitiza 8 mg- 1 capsule daily with breakfast, Ergocalciferol 50,000 units- 1 capsule weekly on Wednesdays, Amlodipine 10 mg -1 tablet daily.  Confirmed delivery date of 05/13/2020, advised patient that pharmacy will contact them the morning  of delivery. Also informed that pharmacy will contact regarding pricing for 90 day supply.   Allergies:   Allergies  Allergen Reactions   Pollen Extract Other (See Comments)    Congestion/sneezing   Tomato Hives and Itching   Codeine Hives, Itching  and Nausea And Vomiting    Medications: Outpatient Encounter Medications as of 05/06/2020  Medication Sig   AMITIZA 8 MCG capsule TAKE 1 CAPSULE(8 MCG) BY MOUTH DAILY WITH BREAKFAST (Patient taking differently: Take 8 mcg by mouth daily with breakfast. )   amLODipine (NORVASC) 10 MG tablet TAKE 1 TABLET(10 MG) BY MOUTH AT BEDTIME   Continuous Blood Gluc Receiver (FREESTYLE LIBRE READER) DEVI 1 each by Does not apply route See admin instructions. CGM device-Freestyle Libre   Continuous Blood Gluc Sensor (FREESTYLE LIBRE 14 DAY SENSOR) MISC 1 each by Does not apply route See admin instructions. CGM 14-day sensors; send refills to [facility] (in [place])    dapagliflozin propanediol (FARXIGA) 5 MG TABS tablet Take 5 mg by mouth daily before breakfast.   docusate sodium (COLACE) 100 MG capsule Take 100 mg by mouth daily as needed for mild constipation.    gabapentin (NEURONTIN) 300 MG capsule TAKE 1 CAPSULE(300 MG) BY MOUTH THREE TIMES DAILY   HUMALOG 100 UNIT/ML injection INJECT 25 UNITS INTO SKIN WITH BREAFAST AND SUPPER AND 35 UNITS WITH LUNCH (Patient taking differently: Inject 25-35 Units into the skin 3 (three) times daily with meals. Inject 35 units at breakfast, 25 units at lunch and 35 units at supper)   Lancets Misc. MISC 1 each by Does not apply route 4 (four) times daily -  with meals and at bedtime.   mirabegron ER (MYRBETRIQ) 50 MG TB24 tablet Take 1 tablet (50 mg total) by mouth daily.   mometasone-formoterol (DULERA) 100-5 MCG/ACT AERO Inhale 2 puffs into the lungs as needed for wheezing.  (Patient not taking: Reported on 05/03/2020)   Multiple Vitamin (MULTIVITAMIN WITH MINERALS) TABS tablet Take 1 tablet by mouth daily.   ondansetron (ZOFRAN) 4 MG tablet Take 1 tablet (4 mg total) by mouth every 6 (six) hours as needed for nausea or vomiting.   pantoprazole (PROTONIX) 40 MG tablet Take 30- 60 min before your first and last meals of the day   pravastatin (PRAVACHOL) 80 MG tablet Take  0.5 tablets (40 mg total) by mouth daily.   Semaglutide, 1 MG/DOSE, (OZEMPIC, 1 MG/DOSE,) 2 MG/1.5ML SOPN INJECT 1MG  INTO HE SKIN EVERY Thursday   spironolactone (ALDACTONE) 50 MG tablet TAKE 1 TABLET(50 MG) BY MOUTH DAILY   Vitamin D, Ergocalciferol, (DRISDOL) 1.25 MG (50000 UNIT) CAPS capsule Take 1 capsule (50,000 Units total) by mouth every 7 (seven) days. TAKE 1 CAPSULE BY MOUTH EVERY 7 DAYS ON WEDNESDAYS   No facility-administered encounter medications on file as of 05/06/2020.    Current Diagnosis: Patient Active Problem List   Diagnosis Date Noted   S/P lumbar laminectomy 03/30/2020   Localized swelling, mass and lump, multiple sites 03/27/2018   Type II diabetes mellitus with renal manifestations ([place]) 12/27/2017   HLD (hyperlipidemia) 12/27/2017   HTN (hypertension) 12/27/2017   CKD (chronic kidney disease), stage III 12/27/2017   Cough variant asthma 12/01/2015   Osteoarthritis of left knee, primary localized 08/17/2014   Knee osteoarthritis 08/17/2014   Hypoglycemia 11/15/2013   Other and unspecified hyperlipidemia 08/06/2013   Type II diabetes mellitus, uncontrolled ([place]) 07/20/2013   Varicose veins of lower extremities with other complications 44/81/8563   Fibromyalgia    GERD  05/18/2010   ABDOMINAL PAIN, GENERALIZED 05/02/2010   OBESITY 04/20/2010   BURSITIS, LEFT SHOULDER 03/09/2010   Lipoma of arm s/p excision 01/29/2014 01/10/2010   DEPRESSION 12/27/2009   BACK PAIN WITH RADICULOPATHY 12/27/2009   CYSTITIS, ACUTE 10/21/2009   MICROSCOPIC HEMATURIA 10/18/2009   KNEE PAIN, BILATERAL 10/18/2009   NEUROPATHY 09/08/2009   Asthma 09/08/2009   STRESS INCONTINENCE 09/08/2009   CHEST PAIN 07/19/2009   ESOPHAGEAL STRICTURE 08/27/2005   Diabetes mellitus without complication ([place]) 76/14/7092    Follow-Up:  Coordination of Enhanced Pharmacy Services and Pharmacist Review- Patient commented on how much she liked packaging of her medication from [facility]. Patient  still has about 60 pills left on Gabapentin and she has restarted Amlodipine, blood pressure recently checked was at 157/77. Patient states her blood sugars are more elevated than normal, morning fasting readings are around 220 and 2 hours after eating and taking insulin it may drop down to the 200's. Patient states her PCP- Dr Baird Cancer may have to add another medication or insulin and/or be referred to a Dietician. Patient is aware that I will notify Jannette Fogo, CPP of medications needed and current blood pressure and blood sugar readings. Patient is also aware pharmacy will contact her prior to delivery with pricing on a 90 day supply packaging.   [name], [place] Pharmacist Assistant [phone]

## 2020-05-09 ENCOUNTER — Other Ambulatory Visit: Payer: Self-pay | Admitting: Nurse Practitioner

## 2020-05-11 ENCOUNTER — Telehealth: Payer: Medicare Other

## 2020-05-11 NOTE — Chronic Care Management (AMB) (Deleted)
Chronic Care Management Pharmacy  Name: [name]  MRN: [id number] DOB: 17-Oct-1950  Chief Complaint/ HPI  [name],  69 y.o. , female presents for their Follow-Up CCM visit with the clinical pharmacist via telephone due to COVID-19 Pandemic. Pt reports that she is doing better since surgery on 6/25. She says the pain comes and goes, but that it is getting better. She reports that she is not sleeping well right now. She cannot get comfortable at night, but does report sleeping during the day. ***FINISH chart prep  PCP : [name], FNP  Their chronic conditions include: Hypertension, Hyperlipidemia, Asthma, GERD, Diabetes with stage III CKD, Osteoarthritis  Office Visits: 05/03/20 OV: HgbA1c up to 11.4%. B12 level within normal limits. Use sliding scale insulin for meal time blood sugars. Will likely start on long-acting insulin.   12/31/19 AWV and OV: Presented for Annual exam. Labs ordered (HgbA1c, Lipid panel, CMP, CBC, Vitamin D). BG has been more elevated recently. Started on Farxiga 80m daily. Fair control of BP, continue current medications. Continue to use pain cream for knee pain as needed; if worsens, will consider referral to orthopedics.   10/22/19 OV: Presented for follow up diabetic visit; labs ordered (lipid panel, CMP14+EGFR, HGB A1c, Vit B12, Vit D). Vitamin D and B12 low. Continue Vitamin D supplement and consider B12 injections. Dietary and exercise recommendations provided. Follow up in 3 months.  09/03/19 OV: Presented for follow up diabetic visit; Lunchtime Humalog increased due to elevated BG (30 units before breakfast, 35 units before lunch, 25 units before supper); constipation relieved with Amitiza.   07/22/19 OV: Presented for constipation, bloating and flatulence. Stool softeners and Metamucil have not relieved symptoms. Referred to GI for dysphagia, Labs ordered (lipid panel, CMP14+EGFR, HgbA1c), diabetic foot exam performed, continue checking  blood sugar and injecting insulin three times daily w/ ozempic weekly. Start Amitiza for constipation. Follow up in 4-6 weeks. Influenza vaccine administered.   Consult Visit: 03/30/20 Orthopedic surgery OV w/ Dr. YLorin Mercy ***  03/02/20 Orthopedic surgery OV w/ [name] and steri-strips applied. No drainage or signs of infection. Pt will gradually increase walking and avoid bending, twisting, and heavy lifting. Follow up w/ Dr. YLorin Mercyin 4 weeks.   02/19/20 Right L4-5 microdiscetomy hemilaminectomy and removal of free fragment.  02/17/20 Orthopedic surgery OV w/ Dr. YLorin Mercy Referred to surgery for herniated nucleus polposus. In significant pain during exam. Pt had already eaten at before OV. Surgery scheduled for Friday, 6/25, for removal of migrated fragment.    02/16/20 Ortho OV w/ Dr. HJunius Roads Presented for low back pain and right sided radiculopathy for the past 2 weeks. Complains of urinary incontinence since new onset of pain. Rx for Dilaudid short course. [facility] w/ Dr. YLorin Mercyfor possible surgical intervention.   02/13/20 ED visit: Presented for leg pain. Seen in ED 3 days ago for same issue. She fell on buttocks yesterday while using her walker (her leg gave out). Pain is worse with walking. Pt reports shooting pain, pressure, and weakness. MRI showed new L4-5 disc extrusion with right L4 nerve root impingement in lateral recess. 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 No acute fracture. Discharged with 6 days prednisone taper.    02/10/20 ED visit: Presented with right leg pain for past several days. Pt denies trauma or injury. Pain starts in lower back and radiates to thigh, shin, and down her  leg. Labs were unremarkable. Hip Xrays unremarkable. CT showed multilevel disease worse on right L4-5. Administered multiple rounds of pain medicine, muscle relaxants, steroids, and NSAIDs. Pain  improved, no MRI needed. Refer to spine surgeon outpatient. Prescribed short course of Norco and Baclofen at discharge. Follow up with PCP in 1-2 days.    10/18/19- ED visit: Presented for sudden onset left ear pain 4 days prior with clear, watery drainage; left eardrum found to be perforated upon exam; recommended naproxen or Tylenol for pain and follow up with ENT.   CCM Encounters: 01/15/20 RN: Reviewed and updated care plan and pt goals  09/16/19 PharmD: Comprehensive medication review, patient education. Pt reported issues receiving FreeStyle Libre.   08/07/19 PharmD: Pt recently seen by GI. Failed capsule swallow evaluation. GI to schedule esophageal dilation procedure. Encouraged pt to crush meds as directed due to difficulty swallowing.  07/31/19 PharmD: Working to obtain [employer] CGM. Pt approved for another 6 moths by [facility].   07/29/19 PharmD:  Collaboration with [facility] for patient's FreeStyle Libre testing supplies  07/27/19 PharmD: Patient requesting FreeStyle Libre assistance, denied at pharmacy. [place] contacted.   07/22/19 RN: Patient education, medication review, coordination of diabetes testing supplies  07/17/19 RN: Compliance with GI and Urology treatment plans encourage, patient education.  Medications: Outpatient Encounter Medications as of 05/11/2020  Medication Sig  . AMITIZA 8 MCG capsule TAKE 1 CAPSULE(8 MCG) BY MOUTH DAILY WITH BREAKFAST (Patient taking differently: Take 8 mcg by mouth daily with breakfast. )  . amLODipine (NORVASC) 10 MG tablet TAKE 1 TABLET(10 MG) BY MOUTH AT BEDTIME  . Continuous Blood Gluc Receiver (FREESTYLE LIBRE READER) DEVI 1 each by Does not apply route See admin instructions. CGM device-Freestyle Libre  . Continuous Blood Gluc Sensor (FREESTYLE LIBRE 14 DAY SENSOR) MISC 1 each by Does not apply route See admin instructions. CGM 14-day sensors; send refills to [facility] (in [place])   . dapagliflozin  propanediol (FARXIGA) 5 MG TABS tablet Take 5 mg by mouth daily before breakfast.  . docusate sodium (COLACE) 100 MG capsule Take 100 mg by mouth daily as needed for mild constipation.   . gabapentin (NEURONTIN) 300 MG capsule TAKE 1 CAPSULE(300 MG) BY MOUTH THREE TIMES DAILY  . HUMALOG 100 UNIT/ML injection INJECT 25 UNITS INTO SKIN WITH BREAFAST AND SUPPER AND 35 UNITS WITH LUNCH (Patient taking differently: Inject 25-35 Units into the skin 3 (three) times daily with meals. Inject 35 units at breakfast, 25 units at lunch and 35 units at supper)  . Lancets Misc. MISC 1 each by Does not apply route 4 (four) times daily -  with meals and at bedtime.  . mirabegron ER (MYRBETRIQ) 50 MG TB24 tablet Take 1 tablet (50 mg total) by mouth daily.  . mometasone-formoterol (DULERA) 100-5 MCG/ACT AERO Inhale 2 puffs into the lungs as needed for wheezing.  (Patient not taking: Reported on 05/03/2020)  . Multiple Vitamin (MULTIVITAMIN WITH MINERALS) TABS tablet Take 1 tablet by mouth daily.  . ondansetron (ZOFRAN) 4 MG tablet Take 1 tablet (4 mg total) by mouth every 6 (six) hours as needed for nausea or vomiting.  . pantoprazole (PROTONIX) 40 MG tablet Take 30- 60 min before your first and last meals of the day  . pravastatin (PRAVACHOL) 80 MG tablet Take 0.5 tablets (40 mg total) by mouth daily.  . Semaglutide, 1 MG/DOSE, (OZEMPIC, 1 MG/DOSE,) 2 MG/1.5ML SOPN INJECT 1MG INTO HE SKIN EVERY Thursday  . spironolactone (ALDACTONE) 50 MG  tablet TAKE 1 TABLET(50 MG) BY MOUTH DAILY  . Vitamin D, Ergocalciferol, (DRISDOL) 1.25 MG (50000 UNIT) CAPS capsule Take 1 capsule (50,000 Units total) by mouth every 7 (seven) days. TAKE 1 CAPSULE BY MOUTH EVERY 7 DAYS ON WEDNESDAYS   No facility-administered encounter medications on file as of 05/11/2020.   Current Diagnosis/Assessment:  Goals Addressed   None                       Diabetes   Recent Relevant Labs: Lab Results  Component Value Date/Time   HGBA1C 11.4 (H)  05/03/2020 02:55 PM   HGBA1C 5.9 (H) 12/31/2019 04:11 PM   MICROALBUR 80 12/31/2019 03:19 PM   MICROALBUR 6.9 (H) 09/07/2016 01:41 PM   MICROALBUR 7.2 (H) 08/30/2015 01:59 PM   Kidney Function Lab Results  Component Value Date/Time   CREATININE 1.24 (H) 05/03/2020 02:55 PM   CREATININE 1.21 (H) 02/19/2020 09:51 AM   GFR 51.07 (L) 05/17/2017 08:54 AM   GFRNONAA 45 (L) 05/03/2020 02:55 PM   GFRAA 52 (L) 05/03/2020 02:55 PM  Stage 3a CKD   Checking BG: Multiple times daily with continuous glucose monitor (FreeStyle Libre)  Recent FBG Readings: 187, 225 Recent pre-meal BG readings: 279, 215 Recent 2hr PP BG readings:  227, 389, 275 Recent HS BG readings: 279,  Patient has failed these meds in past: Invokana, Levemir, Lantus, Victoza, metformin, Actos, Janumet Patient is currently controlled on the following medications:   Ozempic 60m weekly on Thursdays   Humalog 25-35 units before meals (25U am, 35U lunch, 25U dinner)          Also doing additional sliding scale sometimes (5-10 units)  Farxiga 564mdaily   Gabapentin 30025mhree times daily  Last diabetic Foot exam: 07/22/19 Last diabetic Eye exam: 08/10/19   We discussed:   Using Humalog before each meal (25 units AM, 35 units lunch, 25 units dinner)  Uses sliding scale as needed  Taking Ozempic once weekly (sometimes takes on Wednesday, sometimes Thursday)  Taking Farxiga every morning  Pt was given samples of Tresiba, but had not started yet (waiting on instructions from PCP)  Pt has had an appointment recently with diabetic educator  Pt had low readings of 63 and 43 recently before she went to bed  When checked with regular BG meter, readings were almost 20 points higher . Diet extensively o Pt states she is eating protein and vegetables o Water, V8, soda every now and then, tea sweetened with Splenda - 6- 8 ounce glasses of water daily and 2 bottles of water . Pt states she does not have any energy o Says  she is getting 6-7 good hours of sleep a night . States she is not missing any doses of medications . Pt expressed frustration that she is eating better but blood sugar is not improving  Plan Continue current medications  Discuss with PCP increasing Farxiga to 87m52mily and adding Tresiba  Hypertension   Office blood pressures are  BP Readings from Last 3 Encounters:  05/03/20 140/76  03/30/20 129/81  03/22/20 (!) 141/84   Patient has failed these meds in the past: Carvedilol, clonidine, metoprolol, valsartan/HCTZ, losartan/HCTZ Patient is currently uncontrolled on the following medications:   Amlodipine 87mg6mly  Spironolactone 50mg 84my  Patient checks BP at home daily  Patient home BP readings are ranging: 147/82, 156/80, 158/84, 178/87, 174/67, 134/72, 145/77, 167/84, 167/88  We discussed:  Goal <130/80  Discussed possibility of  adding losartan back to regimen to help with blood pressure and renal protection  Plan Continue current medications  Collaborate with PCP about adding back losartan   Hyperlipidemia   Lipid Panel     Component Value Date/Time   CHOL 168 12/31/2019 1611   TRIG 88 12/31/2019 1611   HDL 54 12/31/2019 1611   CHOLHDL 3.1 12/31/2019 1611   CHOLHDL 4 09/07/2016 1341   VLDL 15.6 09/07/2016 1341   LDLCALC 98 12/31/2019 1611   LDLDIRECT 152.8 10/09/2013 0938   LABVLDL 16 12/31/2019 1611     The 10-year ASCVD risk score Mikey Bussing DC Jr., et al., 2013) is: 23.6%   Values used to calculate the score:     Age: 58 years     Sex: Female     Is Non-Hispanic African American: Yes     Diabetic: Yes     Tobacco smoker: No     Systolic Blood Pressure: 222 mmHg     Is BP treated: Yes     HDL Cholesterol: 54 mg/dL     Total Cholesterol: 168 mg/dL   Patient has failed these meds in past: N/A Patient is currently uncontrolled on the following medications:   Pravastatin 80 mg daily, 1/2 tablet daily  Aspirin 11m daily  We discussed:     Goal LDL < 70  Diet extensively  Plan Continue current medications   History of Asthma    Tobacco Status:  Social History   Tobacco Use  Smoking Status Never Smoker  Smokeless Tobacco Never Used   Patient has failed these meds in past: Advair Patient is currently controlled on the following medications:   Proair 2 puffs every 4 hours as needed  Dulera 2 puffs every 12 hours as needed  Using maintenance inhaler regularly? No Frequency of rescue inhaler use:  infrequently as needed  We discussed:    Has not used inhalers in over half a year  Plan Continue current medications  Constipation   Patient has failed these meds in past: Stool softeners, metamucil Patient is currently controlled on the following medications:   Amitiza 854m daily  Docusate 10045maily  We discussed:  Goal for water intake is 64oz daily  Plan Continue current medications  Overactive bladder    Patient has failed these meds in past: Vesicare, oxybutynin Patient is currently controlled on the following medications:   Myrbetriq 33m32mily  Plan Continue current medications  Vitamin D Deficiency  Vitamin D: 29.8 on 10/22/19  Patient has failed these meds in past: N/A Patient is currently uncontrolled on the following medications:   Ergocalciferol 50,000 units daily on Thursdays  Plan Continue current medications  Vaccines   Reviewed and discussed patient's vaccination history.    Immunization History  Administered Date(s) Administered  . Fluad Quad(high Dose 65+) 05/03/2020  . Influenza Whole 06/27/2009, 06/13/2010  . Influenza, High Dose Seasonal PF 07/22/2019  . Influenza,inj,Quad PF,6+ Mos 06/03/2017  . Influenza-Unspecified 03/27/2013, 03/27/2014, 05/18/2015  . Pneumococcal Conjugate-13 07/24/2017  . Pneumococcal Polysaccharide-23 08/27/2006, 11/29/2009, 03/24/2019  . Td 10/18/2009  . Tdap 12/27/2017    Pt [facility] No recommendations at this time  Medication Management   [facility] uses [facility] medications  Pt endorses 70% compliance  We discussed:   Importance of medication adherence  Pt would like to get 90 day supplies of medications  Plan Utilize [facility] pharmacy for medication synchronization, packaging and delivery   Follow up: 2 month  phone call   Jannette Fogo, PharmD Clinical Pharmacist [facility] [phone]

## 2020-05-12 ENCOUNTER — Other Ambulatory Visit: Payer: Self-pay | Admitting: Nurse Practitioner

## 2020-05-12 ENCOUNTER — Other Ambulatory Visit: Payer: Self-pay

## 2020-05-12 ENCOUNTER — Ambulatory Visit: Payer: Medicare Other

## 2020-05-12 DIAGNOSIS — E1165 Type 2 diabetes mellitus with hyperglycemia: Secondary | ICD-10-CM

## 2020-05-12 DIAGNOSIS — I10 Essential (primary) hypertension: Secondary | ICD-10-CM

## 2020-05-12 DIAGNOSIS — K5900 Constipation, unspecified: Secondary | ICD-10-CM

## 2020-05-12 MED ORDER — DAPAGLIFLOZIN PROPANEDIOL 5 MG PO TABS: 10.0000 mg | ORAL_TABLET | Freq: Every day | ORAL | 1 refills | Status: DC

## 2020-05-12 MED ORDER — INSULIN LISPRO (1 UNIT DIAL) 100 UNIT/ML (KWIKPEN): PEN_INJECTOR | SUBCUTANEOUS | 2 refills | Status: DC

## 2020-05-12 MED ORDER — TRESIBA FLEXTOUCH 100 UNIT/ML ~~LOC~~ SOPN: 12.0000 [IU] | PEN_INJECTOR | Freq: Every day | SUBCUTANEOUS | 1 refills | Status: DC

## 2020-05-12 MED ORDER — BD PEN NEEDLE MICRO U/F 32G X 6 MM MISC: 6 refills | Status: DC

## 2020-05-12 NOTE — Patient Instructions (Addendum)
Visit Information  Goals Addressed            This Visit's Progress   . Pharmacy Care Plan       CARE PLAN ENTRY  Current Barriers:  . Chronic Disease Management support, education, and care coordination needs related to Hypertension, Hyperlipidemia, and Diabetes   Hypertension . Pharmacist Clinical Goal(s): o Over the next 45 days, patient will work with PharmD and providers to achieve BP goal <130/80 . Current regimen:  o Amlodipine 10mg  daily o Spironolactone 50mg  daily . Interventions: o Discussed possibility of adding losartan to help reduce blood pressure and for kidney protective benefits o Will collaborate with PCP about adding losartan after next follow up with patient  . Patient self care activities - Over the next 45 days, patient will: o Check BP once weekly and if symptomatic, document, and provide at future appointments o Ensure daily salt intake < 2300 mg/day  Hyperlipidemia . Pharmacist Clinical Goal(s): o Over the next 45 days, patient will work with PharmD and providers to achieve LDL goal < 70 . Current regimen:  o Pravastatin 80mg  1/2 tablet daily . Interventions: o Provided dietary recommendations . Patient self care activities - Over the next 45 days, patient will: o Focus on a heart-healthy, balanced diet as discussed  Diabetes . Pharmacist Clinical Goal(s): o Over the next 45 days, patient will work with PharmD and providers to achieve A1c goal <7% . Current regimen:  o Ozempic 1mg  weekly o Humalog 25 units before breakfast, 35 units before lunch, and 25 units before dinner o Farxiga 5mg  once daily . Interventions: o Dietary recommendations provided o Collaborated with diabetic educator regarding patient case - Discussed initial starting dose of Tresiba, increasing Farxiga, and changing to [employer] instead of vials o Collaborate with PCP regarding starting Tresiba at 12 units daily, increasing to [place] 10mg  daily, and changing to  [employer]. PCP agreed to changes and new prescriptions sent to pharmacy o Advised patient to start using Tresiba sample 12 units daily before bedtime. Also advised patient that Humalog is changing to pens and Wilder Glade is being increased to 10mg  daily. . Patient self care activities - Over the next 45 days, patient will: o Check blood sugar continuously, but at least 3-4 times daily, document, and provide at future appointments o Contact provider with any episodes of hypoglycemia o Increase water intake by 1 glass a day (goal 64 ounces daily) o Start Tresiba 12 units daily before bedtime (sample given at last office visit) o Start Farxiga 10mg  (new dose will be delivered in adherence packaging on 9/17) o Start using Humalog Kwikpen instead of Humalog vial at the same dose o Follow up with diabetic educator on 9/21  Medication management . Pharmacist Clinical Goal(s): o Over the next 45 days, patient will work with PharmD and providers to achieve optimal medication adherence . Current pharmacy: [facility] . Interventions o Comprehensive medication review performed. o Utilize [facility] for medication synchronization, packaging and delivery . Patient self care activities - Over the next 45 days, patient will: o Focus on medication adherence by using adherence packaging o Take medications as prescribed o Report any questions or concerns to PharmD and/or provider(s)  Please see past updates related to this goal by clicking on the "Past Updates" button in the selected goal         The patient verbalized understanding of instructions provided today and agreed to receive a mailed copy of patient instruction and/or educational materials.  Telephone follow up appointment with pharmacy team member scheduled for: 05/25/20 @ 1:00 PM  Jannette Fogo, PharmD Clinical Pharmacist [facility] [phone]    Diabetes Mellitus and Nutrition, Adult When you have  diabetes (diabetes mellitus), it is very important to have healthy eating habits because your blood sugar (glucose) levels are greatly affected by what you eat and drink. Eating healthy foods in the appropriate amounts, at about the same times every day, can help you:  Control your blood glucose.  Lower your risk of heart disease.  Improve your blood pressure.  Reach or maintain a healthy weight. Every person with diabetes is different, and each person has different needs for a meal plan. Your health care provider may recommend that you work with a diet and nutrition specialist (dietitian) to make a meal plan that is best for you. Your meal plan may vary depending on factors such as:  The calories you need.  The medicines you take.  Your weight.  Your blood glucose, blood pressure, and cholesterol levels.  Your activity level.  Other health conditions you have, such as heart or kidney disease. How do carbohydrates affect me? Carbohydrates, also called carbs, affect your blood glucose level more than any other type of food. Eating carbs naturally raises the amount of glucose in your blood. Carb counting is a method for keeping track of how many carbs you eat. Counting carbs is important to keep your blood glucose at a healthy level, especially if you use insulin or take certain oral diabetes medicines. It is important to know how many carbs you can safely have in each meal. This is different for every person. Your dietitian can help you calculate how many carbs you should have at each meal and for each snack. Foods that contain carbs include:  Bread, cereal, rice, pasta, and crackers.  Potatoes and corn.  Peas, beans, and lentils.  Milk and yogurt.  Fruit and juice.  Desserts, such as cakes, cookies, ice cream, and candy. How does alcohol affect me? Alcohol can cause a sudden decrease in blood glucose (hypoglycemia), especially if you use insulin or take certain oral diabetes  medicines. Hypoglycemia can be a life-threatening condition. Symptoms of hypoglycemia (sleepiness, dizziness, and confusion) are similar to symptoms of having too much alcohol. If your health care provider says that alcohol is safe for you, follow these guidelines:  Limit alcohol intake to no more than 1 drink per day for nonpregnant women and 2 drinks per day for men. One drink equals 12 oz of beer, 5 oz of wine, or 1 oz of hard liquor.  Do not drink on an empty stomach.  Keep yourself hydrated with water, diet soda, or unsweetened iced tea.  Keep in mind that regular soda, juice, and other mixers may contain a lot of sugar and must be counted as carbs. What are tips for following this plan?  Reading food labels  Start by checking the serving size on the "Nutrition Facts" label of packaged foods and drinks. The amount of calories, carbs, fats, and other nutrients listed on the label is based on one serving of the item. Many items contain more than one serving per package.  Check the total grams (g) of carbs in one serving. You can calculate the number of servings of carbs in one serving by dividing the total carbs by 15. For example, if a food has 30 g of total carbs, it would be equal to 2 servings of carbs.  Check  the number of grams (g) of saturated and trans fats in one serving. Choose foods that have low or no amount of these fats.  Check the number of milligrams (mg) of salt (sodium) in one serving. Most people should limit total sodium intake to less than 2,300 mg per day.  Always check the nutrition information of foods labeled as "low-fat" or "nonfat". These foods may be higher in added sugar or refined carbs and should be avoided.  Talk to your dietitian to identify your daily goals for nutrients listed on the label. Shopping  Avoid buying canned, premade, or processed foods. These foods tend to be high in fat, sodium, and added sugar.  Shop around the outside edge of the  grocery store. This includes fresh fruits and vegetables, bulk grains, fresh meats, and fresh dairy. Cooking  Use low-heat cooking methods, such as baking, instead of high-heat cooking methods like deep frying.  Cook using healthy oils, such as olive, canola, or sunflower oil.  Avoid cooking with butter, cream, or high-fat meats. Meal planning  Eat meals and snacks regularly, preferably at the same times every day. Avoid going long periods of time without eating.  Eat foods high in fiber, such as fresh fruits, vegetables, beans, and whole grains. Talk to your dietitian about how many servings of carbs you can eat at each meal.  Eat 4-6 ounces (oz) of lean protein each day, such as lean meat, chicken, fish, eggs, or tofu. One oz of lean protein is equal to: ? 1 oz of meat, chicken, or fish. ? 1 egg. ?  cup of tofu.  Eat some foods each day that contain healthy fats, such as avocado, nuts, seeds, and fish. Lifestyle  Check your blood glucose regularly.  Exercise regularly as told by your health care provider. This may include: ? 150 minutes of moderate-intensity or vigorous-intensity exercise each week. This could be brisk walking, biking, or water aerobics. ? Stretching and doing strength exercises, such as yoga or weightlifting, at least 2 times a week.  Take medicines as told by your health care provider.  Do not use any products that contain nicotine or tobacco, such as cigarettes and e-cigarettes. If you need help quitting, ask your health care provider.  Work with a Social worker or diabetes educator to identify strategies to manage stress and any emotional and social challenges. Questions to ask a health care provider  Do I need to meet with a diabetes educator?  Do I need to meet with a dietitian?  What number can I call if I have questions?  When are the best times to check my blood glucose? Where to find more information:  American Diabetes Association:  diabetes.org  Academy of Nutrition and Dietetics: www.eatright.[employer] of Diabetes and Digestive and Kidney Diseases (NIH): [employer] Summary  A healthy meal plan will help you control your blood glucose and maintain a healthy lifestyle.  Working with a diet and nutrition specialist (dietitian) can help you make a meal plan that is best for you.  Keep in mind that carbohydrates (carbs) and alcohol have immediate effects on your blood glucose levels. It is important to count carbs and to use alcohol carefully. This information is not intended to replace advice given to you by your health care provider. Make sure you discuss any questions you have with your health care provider. Document Revised: 07/26/2017 Document Reviewed: 09/17/2016 Elsevier Patient Education  [place]. Insulin Degludec injection What is this medicine? INSULIN DEGLUDEC (IN  su lin de GLOO dek) is a human-made form of insulin. This drug lowers the amount of sugar in your blood. It is a long-acting insulin that is usually given once a day. This medicine may be used for other purposes; ask your health care provider or pharmacist if you have questions. COMMON BRAND NAME(S): [name] What should I tell my health care provider before I take this medicine? They need to know if you have any of these conditions:  episodes of low blood sugar  eye disease, vision problems  kidney disease  liver disease  an unusual or allergic reaction to insulin, other medicines, foods, dyes, or preservatives  pregnant or trying to get pregnant  breast-feeding How should I use this medicine? This medicine is for injection under the skin. Use exactly as directed. This insulin should never be mixed in the same syringe with other insulins before injection. Do not vigorously shake before use. You will be taught how to use this medicine and how to adjust doses for activities and illness. Do not use more insulin  than prescribed. Always check the appearance of your insulin before using it. This medicine should be clear and colorless like water. Do not use it if it is cloudy, thickened, colored, or has solid particles in it. If you use an insulin pen, be sure to take off the outer needle cover before using the dose. It is important that you put your used needles and syringes in a special sharps container. Do not put them in a trash can. If you do not have a sharps container, call your pharmacist or healthcare provider to get one. This drug comes with INSTRUCTIONS FOR USE. Ask your pharmacist for directions on how to use this drug. Read the information carefully. Talk to your pharmacist or health care provider if you have questions. Talk to your pediatrician regarding the use of this medicine in children. While this drug may be prescribed for children as young as 1 year for selected conditions, precautions do apply. Overdosage: If you think you have taken too much of this medicine contact a poison control center or emergency room at once. NOTE: This medicine is only for you. Do not share this medicine with others. What if I miss a dose? For adults: If you miss a dose, take it as soon as you can. Make sure your next dose is taken at least 8 hours later. Do not take double or extra doses. For adolescents and children: It is important not to miss a dose. Your health care professional or doctor should discuss a plan for missed doses with you. If you do miss a dose, follow their plan. Do not take double doses. What may interact with this medicine?  other medicines for diabetes Many medications may cause changes in blood sugar, these include:  alcohol containing beverages  antiviral medicines for HIV or AIDS  aspirin and aspirin-like drugs  certain medicines for blood pressure, heart disease, irregular heart beat  chromium  diuretics  female hormones, such as estrogens or progestins, birth control  pills  fenofibrate  gemfibrozil  isoniazid  lanreotide  female hormones or anabolic steroids  MAOIs like Carbex, Eldepryl, Marplan, Nardil, and Parnate  medicines for weight loss  medicines for allergies, asthma, cold, or cough  medicines for depression, anxiety, or psychotic disturbances  niacin  nicotine  NSAIDs, medicines for pain and inflammation, like ibuprofen or naproxen  octreotide  pasireotide  pentamidine  phenytoin  probenecid  quinolone antibiotics such as ciprofloxacin,  levofloxacin, ofloxacin  some herbal dietary supplements  steroid medicines such as prednisone or cortisone  sulfamethoxazole; trimethoprim  thyroid hormones Some medications can hide the warning symptoms of low blood sugar (hypoglycemia). You may need to monitor your blood sugar more closely if you are taking one of these medications. These include:  beta-blockers, often used for high blood pressure or heart problems (examples include atenolol, metoprolol, propranolol)  clonidine  guanethidine  reserpine This list may not describe all possible interactions. Give your health care provider a list of all the medicines, herbs, non-prescription drugs, or dietary supplements you use. Also tell them if you smoke, drink alcohol, or use illegal drugs. Some items may interact with your medicine. What should I watch for while using this medicine? Visit your health care professional or doctor for regular checks on your progress. A test called the HbA1C (A1C) will be monitored. This is a simple blood test. It measures your blood sugar control over the last 2 to 3 months. You will receive this test every 3 to 6 months. Learn how to check your blood sugar. Learn the symptoms of low and high blood sugar and how to manage them. Always carry a quick-source of sugar with you in case you have symptoms of low blood sugar. Examples include hard sugar candy or glucose tablets. Make sure others know that  you can choke if you eat or drink when you develop serious symptoms of low blood sugar, such as seizures or unconsciousness. They must get medical help at once. Tell your doctor or health care professional if you have high blood sugar. You might need to change the dose of your medicine. If you are sick or exercising more than usual, you might need to change the dose of your medicine. Do not skip meals. Ask your doctor or health care professional if you should avoid alcohol. Many nonprescription cough and cold products contain sugar or alcohol. These can affect blood sugar. Make sure that you have the right kind of syringe for the type of insulin you use. Try not to change the brand and type of insulin or syringe unless your health care professional or doctor tells you to. Switching insulin brand or type can cause dangerously high or low blood sugar. Always keep an extra supply of insulin, syringes, and needles on hand. Use a syringe one time only. Throw away syringe and needle in a closed container to prevent accidental needle sticks. Insulin pens and cartridges should never be shared. Even if the needle is changed, sharing may result in passing of viruses like hepatitis or HIV. Each time you get a new box of pen needles, check to see if they are the same type as the ones you were trained to use. If not, ask your health care professional to show you how to use this new type properly. Wear a medical ID bracelet or chain, and carry a card that describes your disease and details of your medicine and dosage times. What side effects may I notice from receiving this medicine? Side effects that you should report to your doctor or health care professional as soon as possible:  allergic reactions like skin rash, itching or hives, swelling of the face, lips, or tongue  breathing problems  signs and symptoms of high blood sugar such as dizziness, dry mouth, dry skin, fruity breath, nausea, stomach pain, increased  hunger or thirst, increased urination  signs and symptoms of low blood sugar such as feeling anxious, confusion, dizziness, increased hunger,  unusually weak or tired, sweating, shakiness, cold, irritable, headache, blurred vision, fast heartbeat, loss of consciousness Side effects that usually do not require medical attention (report to your doctor or health care professional if they continue or are bothersome):  increase or decrease in fatty tissue under the skin due to overuse of a particular injection site  itching, burning, swelling, or rash at site where injected This list may not describe all possible side effects. Call your doctor for medical advice about side effects. You may report side effects to FDA at 1-800-FDA-1088. Where should I keep my medicine? Keep out of the reach of children. Unopened Vials: Tresiba vials: Store in a refrigerator between 2 and 8 degrees C (36 and 46 degrees F) or at room temperature below 30 degrees C (86 degrees F). Do not freeze or use if the insulin has been frozen. Protect from light and excessive heat. If stored at room temperature, the vial must be discarded after 56 days (8 weeks). Throw away any unopened and unused medicine that has been stored in the refrigerator after the expiration date. Unopened Pens: [name] pens: Store in a refrigerator between 2 and 8 degrees C (36 and 46 degrees F) or at room temperature below 30 degrees C (86 degrees F). Do not freeze or use if the insulin has been frozen. Protect from light and excessive heat. If stored at room temperature, the pen must be discarded after 56 days (8 weeks). Throw away any unopened and unused medicine that has been stored in the refrigerator after the expiration date. Vials that you are using: Tresiba vials: Store in a refrigerator or at room temperature below 30 degrees C (86 degrees F). Do not freeze. Keep away from heat and light. Throw the opened vial away after 56 days (8  weeks). Pens that you are using: [name] pens: Store in a refrigerator or at room temperature below 30 degrees C (86 degrees F). Do not freeze. Keep away from heat and light. Throw the pen away after 56 days (8 weeks), even if it still has insulin left in it. NOTE: This sheet is a summary. It may not cover all possible information. If you have questions about this medicine, talk to your doctor, pharmacist, or health care provider.  2020 Elsevier/Gold Standard (2019-04-28 08:05:09)

## 2020-05-12 NOTE — Chronic Care Management (AMB) (Signed)
Chronic Care Management Pharmacy  Name: [name]  MRN: [id number] DOB: Mar 31, 1951  Chief Complaint/ HPI  [name],  69 y.o. , female presents for their Follow-Up CCM visit with the clinical pharmacist via telephone due to COVID-19 Pandemic.  PCP : [name], FNP  Their chronic conditions include: Hypertension, Hyperlipidemia, Asthma, GERD, Diabetes with stage III CKD, Osteoarthritis  Office Visits: 05/03/20 OV: DM follow up. Pt is using FreeStyle Libre meter. HgbA1c up to 11.4%. B12 level within normal limits. Use sliding scale insulin for meal time blood sugars. Will likely start on long-acting insulin (Tresiba samples given, but advised to wait to start until directions given).   12/31/19 AWV and OV: Presented for Annual exam. Labs ordered (HgbA1c, Lipid panel, CMP, CBC, Vitamin D). BG has been more elevated recently. Started on Farxiga 69m daily. Fair control of BP, continue current medications. Continue to use pain cream for knee pain as needed; if worsens, will consider referral to orthopedics.   10/22/19 OV: Presented for follow up diabetic visit; labs ordered (lipid panel, CMP14+EGFR, HGB A1c, Vit B12, Vit D). Vitamin D and B12 low. Continue Vitamin D supplement and consider B12 injections. Dietary and exercise recommendations provided. Follow up in 3 months.  09/03/19 OV: Presented for follow up diabetic visit; Lunchtime Humalog increased due to elevated BG (30 units before breakfast, 35 units before lunch, 25 units before supper); constipation relieved with Amitiza.   07/22/19 OV: Presented for constipation, bloating and flatulence. Stool softeners and Metamucil have not relieved symptoms. Referred to GI for dysphagia, Labs ordered (lipid panel, CMP14+EGFR, HgbA1c), diabetic foot exam performed, continue checking blood sugar and injecting insulin three times daily w/ ozempic weekly. Start Amitiza for constipation. Follow up in 4-6 weeks. Influenza vaccine  administered.   Consult Visit: 03/30/20 Orthopedic surgery OV w/ Dr. YLorin Mercy Follow up post L4 hemilaminectomy on the right and removal of a cephalad migrated free fragment with radiculopathy. Can do straight leg raise (unable to do before surgery). Continue leg raises using purse as counterweight. Pt happy that leg pain is significantly better. Return in 3 months.   03/02/20 Orthopedic surgery OV w/ [name] and steri-strips applied. No drainage or signs of infection. Pt will gradually increase walking and avoid bending, twisting, and heavy lifting. Follow up w/ Dr. YLorin Mercyin 4 weeks.   02/19/20 Right L4-5 microdiscetomy hemilaminectomy and removal of free fragment.  02/17/20 Orthopedic surgery OV w/ Dr. YLorin Mercy Referred to surgery for herniated nucleus polposus. In significant pain during exam. Pt had already eaten at before OV. Surgery scheduled for Friday, 6/25, for removal of migrated fragment.    02/16/20 Ortho OV w/ Dr. HJunius Roads Presented for low back pain and right sided radiculopathy for the past 2 weeks. Complains of urinary incontinence since new onset of pain. Rx for Dilaudid short course. [facility] YLorin Mercyfor possible surgical intervention.   02/13/20 ED visit: Presented for leg pain. Seen in ED 3 days ago for same issue. She fell on buttocks yesterday while using her walker (her leg gave out). Pain is worse with walking. Pt reports shooting pain, pressure, and weakness. MRI showed new L4-5 disc extrusion with right L4 nerve root impingement in lateral recess. 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 No acute fracture. Discharged with 6 days prednisone taper.    02/10/20 ED visit: Presented with right leg pain for past several days.  Pt denies trauma or injury. Pain starts in lower back and radiates to thigh, shin, and down her leg. Labs were unremarkable. Hip Xrays unremarkable. CT  showed multilevel disease worse on right L4-5. Administered multiple rounds of pain medicine, muscle relaxants, steroids, and NSAIDs. Pain improved, no MRI needed. Refer to spine surgeon outpatient. Prescribed short course of Norco and Baclofen at discharge. Follow up with PCP in 1-2 days.    10/18/19- ED visit: Presented for sudden onset left ear pain 4 days prior with clear, watery drainage; left eardrum found to be perforated upon exam; recommended naproxen or Tylenol for pain and follow up with ENT.   CCM Encounters: 01/15/20 RN: Reviewed and updated care plan and pt goals  09/16/19 PharmD: Comprehensive medication review, patient education. Pt reported issues receiving FreeStyle Libre.   08/07/19 PharmD: Pt recently seen by GI. Failed capsule swallow evaluation. GI to schedule esophageal dilation procedure. Encouraged pt to crush meds as directed due to difficulty swallowing.  07/31/19 PharmD: Working to obtain [employer] CGM. Pt approved for another 6 moths by [employer].   07/29/19 PharmD:  Collaboration with [facility] for patient's FreeStyle Libre testing supplies  07/27/19 PharmD: Patient requesting FreeStyle Libre assistance, denied at pharmacy. [facility] contacted.   07/22/19 RN: Patient education, medication review, coordination of diabetes testing supplies  07/17/19 RN: Compliance with GI and Urology treatment plans encourage, patient education.  Medications: Outpatient Encounter Medications as of 05/12/2020  Medication Sig  . amLODipine (NORVASC) 10 MG tablet TAKE ONE TABLET BY MOUTH EVERYDAY AT BEDTIME  . Continuous Blood Gluc Receiver (FREESTYLE LIBRE READER) DEVI 1 each by Does not apply route See admin instructions. CGM device-Freestyle Libre  . Continuous Blood Gluc Sensor (FREESTYLE LIBRE 14 DAY SENSOR) MISC 1 each by Does not apply route See admin instructions. CGM 14-day sensors; send refills to [facility] (in [place])   . docusate sodium  (COLACE) 100 MG capsule Take 100 mg by mouth daily as needed for mild constipation.   . gabapentin (NEURONTIN) 300 MG capsule TAKE 1 CAPSULE(300 MG) BY MOUTH THREE TIMES DAILY  . Lancets Misc. MISC 1 each by Does not apply route 4 (four) times daily -  with meals and at bedtime.  . mirabegron ER (MYRBETRIQ) 50 MG TB24 tablet Take 1 tablet (50 mg total) by mouth daily.  . mometasone-formoterol (DULERA) 100-5 MCG/ACT AERO Inhale 2 puffs into the lungs as needed for wheezing.  (Patient not taking: Reported on 05/03/2020)  . Multiple Vitamin (MULTIVITAMIN WITH MINERALS) TABS tablet Take 1 tablet by mouth daily.  . ondansetron (ZOFRAN) 4 MG tablet Take 1 tablet (4 mg total) by mouth every 6 (six) hours as needed for nausea or vomiting.  . pantoprazole (PROTONIX) 40 MG tablet Take 30- 60 min before your first and last meals of the day  . pravastatin (PRAVACHOL) 80 MG tablet Take 0.5 tablets (40 mg total) by mouth daily.  . Semaglutide, 1 MG/DOSE, (OZEMPIC, 1 MG/DOSE,) 2 MG/1.5ML SOPN INJECT 1MG INTO HE SKIN EVERY Thursday  . spironolactone (ALDACTONE) 50 MG tablet TAKE ONE TABLET BY MOUTH EVERY MORNING  . Vitamin D, Ergocalciferol, (DRISDOL) 1.25 MG (50000 UNIT) CAPS capsule Take 1 capsule (50,000 Units total) by mouth every 7 (seven) days. TAKE 1 CAPSULE BY MOUTH EVERY 7 DAYS ON WEDNESDAYS  . [DISCONTINUED] HUMALOG 100 UNIT/ML injection INJECT 25 UNITS INTO SKIN WITH BREAFAST AND SUPPER AND 35 UNITS WITH LUNCH (Patient taking differently: Inject 25-35 Units into the skin 3 (three) times  daily with meals. Inject 35 units at breakfast, 25 units at lunch and 35 units at supper)   No facility-administered encounter medications on file as of 05/12/2020.   Current Diagnosis/Assessment:  Goals Addressed            This Visit's Progress   . Pharmacy Care Plan       CARE PLAN ENTRY  Current Barriers:  . Chronic Disease Management support, education, and care coordination needs related to Hypertension,  Hyperlipidemia, and Diabetes   Hypertension . Pharmacist Clinical Goal(s): o Over the next 45 days, patient will work with PharmD and providers to achieve BP goal <130/80 . Current regimen:  o Amlodipine 62m daily o Spironolactone 577mdaily . Interventions: o Discussed possibility of adding losartan to help reduce blood pressure and for kidney protective benefits o Will collaborate with PCP about adding losartan after next follow up with patient  . Patient self care activities - Over the next 45 days, patient will: o Check BP once weekly and if symptomatic, document, and provide at future appointments o Ensure daily salt intake < 2300 mg/day  Hyperlipidemia . Pharmacist Clinical Goal(s): o Over the next 45 days, patient will work with PharmD and providers to achieve LDL goal < 70 . Current regimen:  o Pravastatin 8031m/2 tablet daily . Interventions: o Provided dietary recommendations . Patient self care activities - Over the next 45 days, patient will: o Focus on a heart-healthy, balanced diet as discussed  Diabetes . Pharmacist Clinical Goal(s): o Over the next 45 days, patient will work with PharmD and providers to achieve A1c goal <7% . Current regimen:  o Ozempic 1mg74mekly o Humalog 25 units before breakfast, 35 units before lunch, and 25 units before dinner o Farxiga 5mg 48me daily . Interventions: o Dietary recommendations provided o Collaborated with diabetic educator regarding patient case - Discussed initial starting dose of Tresiba, increasing Farxiga, and changing to HumalFrontier Oil Corporationead of vials o Collaborate with PCP regarding starting Tresiba at 12 units daily, increasing to Farxi[place] 72my, and changing to HumaloFrontier Oil Corporationagreed to changes and new prescriptions sent to pharmacy o Advised patient to start using Tresiba sample 12 units daily before bedtime. Also advised patient that Humalog is changing to pens and FarxigWilder Gladeing increased to 10mg  53my. . Patient self care activities - Over the next 45 days, patient will: o Check blood sugar continuously, but at least 3-4 times daily, document, and provide at future appointments o Contact provider with any episodes of hypoglycemia o Increase water intake by 1 glass a day (goal 64 ounces daily) o Start Tresiba 12 units daily before bedtime (sample given at last office visit) o Start Farxiga 10mg (n61mose will be delivered in adherence packaging on 9/17) o Start using Humalog Kwikpen instead of Humalog vial at the same dose o Follow up with diabetic educator on 9/21  Medication management . Pharmacist Clinical Goal(s): o Over the next 45 days, patient will work with PharmD and providers to achieve optimal medication adherence . Current pharmacy: [facility] . Interventions o Comprehensive medication review performed. o Utilize [facility] pharmacy for medication synchronization, packaging and delivery . Patient self care activities - Over the next 45 days, patient will: o Focus on medication adherence by using adherence packaging o Take medications as prescribed o Report any questions or concerns to PharmD and/or provider(s)  Please see past updates related to this goal by clicking on the "Past Updates" button in the selected  goal                           Diabetes   Recent Relevant Labs: Lab Results  Component Value Date/Time   HGBA1C 11.4 (H) 05/03/2020 02:55 PM   HGBA1C 5.9 (H) 12/31/2019 04:11 PM   MICROALBUR 80 12/31/2019 03:19 PM   MICROALBUR 6.9 (H) 09/07/2016 01:41 PM   MICROALBUR 7.2 (H) 08/30/2015 01:59 PM   Kidney Function Lab Results  Component Value Date/Time   CREATININE 1.24 (H) 05/03/2020 02:55 PM   CREATININE 1.21 (H) 02/19/2020 09:51 AM   GFR 51.07 (L) 05/17/2017 08:54 AM   GFRNONAA 45 (L) 05/03/2020 02:55 PM   GFRAA 52 (L) 05/03/2020 02:55 PM  Stage 3a CKD   Checking BG: Multiple times daily with continuous glucose monitor (FreeStyle  Libre)  Recent FBG Readings: 187, 225 Recent pre-meal BG readings: 279, 215 Recent 2hr PP BG readings:  227, 389, 275 Recent HS BG readings: 279,  Patient has failed these meds in past: Invokana, Levemir, Lantus, Victoza, metformin, Actos, Janumet Patient is currently controlled on the following medications:   Ozempic 30m weekly on Thursdays   Humalog 25-35 units before meals (25U am, 35U lunch, 25U dinner)  Also doing additional sliding scale sometimes (5-10 units)  Farxiga 561mdaily   Gabapentin 30021mhree times daily  Last diabetic Foot exam: 07/22/19 Last diabetic Eye exam: 08/10/19   We discussed:   Using Humalog before each meal (25 units breakfast, 35 units lunch, 25 units dinner)  Uses sliding scale as needed  Taking Ozempic once weekly (sometimes takes on Wednesday, sometimes Thursday)  Taking Farxiga every morning  Pt was given samples of Tresiba, but had not started yet (waiting on instructions from PCP)  Pt has had an appointment recently with diabetic educator (Genelle Hix)  Pt had low readings of 63 and 43 recently before she went to bed  When checked with regular BG meter, readings were almost 20 points higher . Diet extensively o Pt states she is eating protein and vegetables o Water, V8, soda every now and then, tea sweetened with Splenda - 6- 8 ounce glasses of water daily and 2 bottles of water . Pt states she does not have any energy o Says she is getting 6-7 good hours of sleep a night . States she is not missing any doses of medications . Pt expressed frustration that she is eating better but blood sugar is not improving . Collaborated with Genelle Hix (diabetic educator) via phone to discuss pt case o Discussed adding Tresiba 12 units daily and continuing current dose of Humalog o Also discussed increasing Farxiga to 74m68mily o Genelle discussed with patient changing to Humalog Kwikpen instead of vials to reduce chance of error with dosing  administration o Genelle is scheduled to follow up with the patient on Tuesday (9/21) . Collaborated with PCP in office after conversation with diabetic educator o PCP agreed with adding 12 units of Tresiba daily o PCP agreed with increasing Farxiga to 74mg38mly o Prescriptions sent to pharmacy for Farxiga 74mg,29malog Kwikpen, and pen needles to use with insulin pens (TresiTyler Aasription not needed at this time due to pt receiving samples) . Called pt back to notify to start using Tresiba 12 units daily at bedtime. Also notified her of change to Humalog Kwikpen (from vials) and increase to FarxigIrand59m o Pt expressed understanding of all directions given o Discussed data sharing on  FreeStyle Libre meter so that PCP can review BG readings  Plan Continue current medications   Hypertension   Office blood pressures are  BP Readings from Last 3 Encounters:  05/03/20 140/76  03/30/20 129/81  03/22/20 (!) 141/84   Patient has failed these meds in the past: Carvedilol, clonidine, metoprolol, valsartan/HCTZ, losartan/HCTZ Patient is currently uncontrolled on the following medications:   Amlodipine 52m daily  Spironolactone 573mdaily  Patient checks BP at home daily  Patient home BP readings are ranging: 147/82, 156/80, 158/84, 178/87, 174/67, 134/72, 145/77, 167/84, 167/88  We discussed:  Goal <130/80  Discussed possibility of adding losartan back to regimen to help with blood pressure and renal protection  Not going to suggest change right now due to multiple changes in diabetes regimen  Plan Continue current medications  Collaborate with PCP about adding back losartan after next follow up   Hyperlipidemia   Lipid Panel     Component Value Date/Time   CHOL 168 12/31/2019 1611   TRIG 88 12/31/2019 1611   HDL 54 12/31/2019 1611   CHOLHDL 3.1 12/31/2019 1611   CHOLHDL 4 09/07/2016 1341   VLDL 15.6 09/07/2016 1341   LDLCALC 98 12/31/2019 1611   LDLDIRECT 152.8  10/09/2013 0938   LABVLDL 16 12/31/2019 1611     The 10-year ASCVD risk score (GMikey BussingC Jr., et al., 2013) is: 23.6%   Values used to calculate the score:     Age: [age]     Sex: Female     Is Non-Hispanic African American: Yes     Diabetic: Yes     Tobacco smoker: No     Systolic Blood Pressure: 14527mHg     Is BP treated: Yes     HDL Cholesterol: 54 mg/dL     Total Cholesterol: 168 mg/dL   Patient has failed these meds in past: N/A Patient is currently uncontrolled on the following medications:   Pravastatin 80 mg daily, 1/2 tablet daily  Aspirin 8148maily  We discussed:    Goal LDL < 70  Diet extensively  Plan Continue current medications   History of Asthma    Tobacco Status:  Social History   Tobacco Use  Smoking Status Never Smoker  Smokeless Tobacco Never Used   Patient has failed these meds in past: Advair Patient is currently controlled on the following medications:   Proair 2 puffs every 4 hours as needed  Dulera 2 puffs every 12 hours as needed  Using maintenance inhaler regularly? No Frequency of rescue inhaler use:  infrequently as needed  We discussed:    Has not used inhalers in over half a year  Plan Continue current medications  Constipation   Patient has failed these meds in past: Stool softeners, metamucil Patient is currently controlled on the following medications:   Amitiza 8mc29maily  Docusate 100mg76mly  We discussed:  Goal for water intake is 64oz daily  Plan Continue current medications  Overactive bladder    Patient has failed these meds in past: Vesicare, oxybutynin Patient is currently controlled on the following medications:   Myrbetriq 50mg 47my  Plan Continue current medications  Vitamin D Deficiency  Vitamin D: 29.8 on 10/22/19  Patient has failed these meds in past: N/A Patient is currently uncontrolled on the following medications:   Ergocalciferol 50,000 units daily on  Thursdays  Plan Continue current medications  Vaccines   Reviewed and discussed patient's vaccination history.    Immunization History  Administered Date(s)  Administered  . Fluad Quad(high Dose 65+) 05/03/2020  . Influenza Whole 06/27/2009, 06/13/2010  . Influenza, High Dose Seasonal PF 07/22/2019  . Influenza,inj,Quad PF,6+ Mos 06/03/2017  . Influenza-Unspecified 03/27/2013, 03/27/2014, 05/18/2015  . Pneumococcal Conjugate-13 07/24/2017  . Pneumococcal Polysaccharide-23 08/27/2006, 11/29/2009, 03/24/2019  . Td 10/18/2009  . Tdap 12/27/2017    Pt states she received Shingrix at [facility].  Plan No recommendations at this time  Medication Management   Pt uses [facility] for all medications  Pt endorses 70% compliance  We discussed:   Importance of medication adherence  Pt would like to get 90 day supplies of medications  Coordinated with pharmacy regarding patient desire for 90 day supplies and advised of changes to medication regimen  Plan Utilize [facility] pharmacy for medication synchronization, packaging and delivery   Follow up: 2 week phone call   Jannette Fogo, PharmD Clinical Pharmacist [facility] [phone]

## 2020-05-20 ENCOUNTER — Telehealth: Payer: Self-pay

## 2020-05-20 NOTE — Chronic Care Management (AMB) (Signed)
Chronic Care Management Pharmacy Assistant   Name: KUMIKO FISHMAN  MRN: [id number] DOB: 01/05/1951  Reason for Encounter: Disease State / Diabetes Adherence  Patient Questions:  1.  Have you seen any other providers since your last visit? No  2.  Any changes in your medicines or health? Yes, Tresiba increased to 14 units  PCP : [name], MD  Allergies:   Allergies  Allergen Reactions  . Pollen Extract Other (See Comments)    Congestion/sneezing  . Tomato Hives and Itching  . Codeine Hives, Itching and Nausea And Vomiting    Medications: Outpatient Encounter Medications as of 05/20/2020  Medication Sig  . amLODipine (NORVASC) 10 MG tablet TAKE ONE TABLET BY MOUTH EVERYDAY AT BEDTIME  . Continuous Blood Gluc Receiver (FREESTYLE LIBRE READER) DEVI 1 each by Does not apply route See admin instructions. CGM device-Freestyle Libre  . Continuous Blood Gluc Sensor (FREESTYLE LIBRE 14 DAY SENSOR) MISC 1 each by Does not apply route See admin instructions. CGM 14-day sensors; send refills to [facility] (in [place])   . dapagliflozin propanediol (FARXIGA) 5 MG TABS tablet Take 2 tablets (10 mg total) by mouth daily.  [name] docusate sodium (COLACE) 100 MG capsule Take 100 mg by mouth daily as needed for mild constipation.   . gabapentin (NEURONTIN) 300 MG capsule TAKE 1 CAPSULE(300 MG) BY MOUTH THREE TIMES DAILY  . insulin degludec (TRESIBA FLEXTOUCH) 100 UNIT/ML FlexTouch Pen Inject 12 Units into the skin daily.  . insulin lispro (HUMALOG KWIKPEN) 100 UNIT/ML KwikPen Take 25 units [place] breakfast and dinner and 35 units at lunch  . Insulin Pen Needle (BD PEN NEEDLE MICRO U/F) 32G X 6 MM MISC DX:E11.65 USE TO CHECK BLOOD SUGARS THREE TIMES A DAY  . Lancets Misc. MISC 1 each by Does not apply route 4 (four) times daily -  with meals and at bedtime.  [name] lubiprostone (AMITIZA) 8 MCG capsule Take 1 capsule (8 mcg total) by mouth daily with breakfast.  . mirabegron ER (MYRBETRIQ) 50 MG  TB24 tablet Take 1 tablet (50 mg total) by mouth daily.  . mometasone-formoterol (DULERA) 100-5 MCG/ACT AERO Inhale 2 puffs into the lungs as needed for wheezing.  (Patient not taking: Reported on 05/03/2020)  . Multiple Vitamin (MULTIVITAMIN WITH MINERALS) TABS tablet Take 1 tablet by mouth daily.  . ondansetron (ZOFRAN) 4 MG tablet Take 1 tablet (4 mg total) by mouth every 6 (six) hours as needed for nausea or vomiting.  . pantoprazole (PROTONIX) 40 MG tablet Take 30- 60 min before your first and last meals of the day  . pravastatin (PRAVACHOL) 80 MG tablet Take 0.5 tablets (40 mg total) by mouth daily.  . Semaglutide, 1 MG/DOSE, (OZEMPIC, 1 MG/DOSE,) 2 MG/1.5ML SOPN INJECT 1MG  INTO HE SKIN EVERY Thursday  . spironolactone (ALDACTONE) 50 MG tablet TAKE ONE TABLET BY MOUTH EVERY MORNING  . Vitamin D, Ergocalciferol, (DRISDOL) 1.25 MG (50000 UNIT) CAPS capsule Take 1 capsule (50,000 Units total) by mouth every 7 (seven) days. TAKE 1 CAPSULE BY MOUTH EVERY 7 DAYS ON WEDNESDAYS   No facility-administered encounter medications on file as of 05/20/2020.    Current Diagnosis: Patient Active Problem List   Diagnosis Date Noted  . S/P lumbar laminectomy 03/30/2020  . Localized swelling, mass and lump, multiple sites 03/27/2018  . Type II diabetes mellitus with renal manifestations ([place]) 12/27/2017  . HLD (hyperlipidemia) 12/27/2017  . HTN (hypertension) 12/27/2017  . CKD (chronic kidney disease), stage III 12/27/2017  .  Cough variant asthma 12/01/2015  . Osteoarthritis of left knee, primary localized 08/17/2014  . Knee osteoarthritis 08/17/2014  . Hypoglycemia 11/15/2013  . Other and unspecified hyperlipidemia 08/06/2013  . Type II diabetes mellitus, uncontrolled ([place]) 07/20/2013  . Varicose veins of lower extremities with other complications 37/62/8315  . Fibromyalgia   . GERD 05/18/2010  . ABDOMINAL PAIN, GENERALIZED 05/02/2010  . OBESITY 04/20/2010  . BURSITIS, LEFT SHOULDER 03/09/2010  .  Lipoma of arm s/p excision 01/29/2014 01/10/2010  . DEPRESSION 12/27/2009  . BACK PAIN WITH RADICULOPATHY 12/27/2009  . CYSTITIS, ACUTE 10/21/2009  . MICROSCOPIC HEMATURIA 10/18/2009  . KNEE PAIN, BILATERAL 10/18/2009  . NEUROPATHY 09/08/2009  . Asthma 09/08/2009  . STRESS INCONTINENCE 09/08/2009  . CHEST PAIN 07/19/2009  . ESOPHAGEAL STRICTURE 08/27/2005  . Diabetes mellitus without complication ([place]) 17/61/6073    Goals Addressed            This Visit's Progress   . Pharmacy Care Plan   On track    CARE PLAN ENTRY  Current Barriers:  . Chronic Disease Management support, education, and care coordination needs related to Hypertension, Hyperlipidemia, and Diabetes   Hypertension . Pharmacist Clinical Goal(s): o Over the next 45 days, patient will work with PharmD and providers to achieve BP goal <130/80 . Current regimen:  o Amlodipine 10mg  daily o Spironolactone 50mg  daily . Interventions: o Discussed possibility of adding losartan to help reduce blood pressure and for kidney protective benefits o Will collaborate with PCP about adding losartan after next follow up with patient  . Patient self care activities - Over the next 45 days, patient will: o Check BP once weekly and if symptomatic, document, and provide at future appointments o Ensure daily salt intake < 2300 mg/day  Hyperlipidemia . Pharmacist Clinical Goal(s): o Over the next 45 days, patient will work with PharmD and providers to achieve LDL goal < 70 . Current regimen:  o Pravastatin 80mg  1/2 tablet daily . Interventions: o Provided dietary recommendations . Patient self care activities - Over the next 45 days, patient will: o Focus on a heart-healthy, balanced diet as discussed  Diabetes . Pharmacist Clinical Goal(s): o Over the next 45 days, patient will work with PharmD and providers to achieve A1c goal <7% . Current regimen:  o Ozempic 1mg  weekly o Humalog 25 units before breakfast, 35 units before  lunch, and 25 units before dinner o Farxiga 5mg  once daily . Interventions: o Dietary recommendations provided o Collaborated with diabetic educator regarding patient case - Discussed initial starting dose of Tresiba, increasing Farxiga, and changing to [employer] instead of vials o Collaborate with PCP regarding starting Tresiba at 12 units daily, increasing to [place] 10mg  daily, and changing to [employer]. PCP agreed to changes and new prescriptions sent to pharmacy o Advised patient to start using Tresiba sample 12 units daily before bedtime. Also advised patient that Humalog is changing to pens and Wilder Glade is being increased to 10mg  daily. . Patient self care activities - Over the next 45 days, patient will: o Check blood sugar continuously, but at least 3-4 times daily, document, and provide at future appointments o Contact provider with any episodes of hypoglycemia o Increase water intake by 1 glass a day (goal 64 ounces daily) o Start Tresiba 12 units daily before bedtime (sample given at last office visit) o Start Farxiga 10mg  (new dose will be delivered in adherence packaging on 9/17) o Start using Humalog Kwikpen instead of Humalog vial at the same  dose o Follow up with diabetic educator on 9/21  Medication management . Pharmacist Clinical Goal(s): o Over the next 45 days, patient will work with PharmD and providers to achieve optimal medication adherence . Current pharmacy: [facility] . Interventions o Comprehensive medication review performed. o Utilize [facility] for medication synchronization, packaging and delivery . Patient self care activities - Over the next 45 days, patient will: o Focus on medication adherence by using adherence packaging o Take medications as prescribed o Report any questions or concerns to PharmD and/or provider(s)  Please see past updates related to this goal by clicking on the "Past Updates" button in the selected goal         Recent Relevant Labs: Lab Results  Component Value Date/Time   HGBA1C 11.4 (H) 05/03/2020 02:55 PM   HGBA1C 5.9 (H) 12/31/2019 04:11 PM   MICROALBUR 80 12/31/2019 03:19 PM   MICROALBUR 6.9 (H) 09/07/2016 01:41 PM   MICROALBUR 7.2 (H) 08/30/2015 01:59 PM    Kidney Function Lab Results  Component Value Date/Time   CREATININE 1.24 (H) 05/03/2020 02:55 PM   CREATININE 1.21 (H) 02/19/2020 09:51 AM   GFR 51.07 (L) 05/17/2017 08:54 AM   GFRNONAA 45 (L) 05/03/2020 02:55 PM   GFRAA 52 (L) 05/03/2020 02:55 PM    . Current antihyperglycemic regimen:  o Ozempc 1 mg weekly o Humalog 25 units before breakfast, 35 units before lunch and 25 units before dinner o Farxiga 5 mg once daily o Tresiba 14 units once daily . What recent interventions/DTPs have been made to improve glycemic control:  o Increasing Farxiga 10 mg, Adding Tresiba 14 units, using Humalog pens . Have there been any recent hospitalizations or ED visits since last visit with CPP? No . Patient denies hypoglycemic symptoms, including Pale, Sweaty, Shaky, Hungry, Nervous/irritable and Vision changes . Patient denies hyperglycemic symptoms, including blurry vision, excessive thirst, fatigue, polyuria and weakness . How often are you checking your blood sugar? 3-4 times daily . What are your blood sugars ranging?  o Fasting: 144 o Before meals: 293 o After meals: 196 and 207  o Bedtime: none . During the week, how often does your blood glucose drop below 70? Never . Are you checking your feet daily/regularly?   Adherence Review: Is the patient currently on a STATIN medication? Yes Is the patient currently on ACE/ARB medication? No Does the patient have >5 day gap between last estimated fill dates? No   Follow-Up:  Pharmacist Review- Patient is using FreeStyle Libre and has connected her tablet to the app but pharmacist is unable to see her results. Patient aware that at next visit to bring tablet so Loma Sousa can help her  give them access to blood sugar results. Patient had a call visit for 05/25/20 that she is able to move to 3:00pm for in-person.  Jannette Fogo, CPP aware.   [name], [place] Pharmacist Assistant [phone]

## 2020-05-25 ENCOUNTER — Other Ambulatory Visit: Payer: Self-pay

## 2020-05-25 ENCOUNTER — Ambulatory Visit: Payer: Self-pay

## 2020-05-25 DIAGNOSIS — E1165 Type 2 diabetes mellitus with hyperglycemia: Secondary | ICD-10-CM

## 2020-05-25 DIAGNOSIS — I10 Essential (primary) hypertension: Secondary | ICD-10-CM

## 2020-05-25 NOTE — Patient Instructions (Addendum)
Visit Information  Goals Addressed            This Visit's Progress   . Pharmacy Care Plan       CARE PLAN ENTRY  Current Barriers:  . Chronic Disease Management support, education, and care coordination needs related to Hypertension, Hyperlipidemia, and Diabetes   Hypertension . Pharmacist Clinical Goal(s): o Over the next 45 days, patient will work with PharmD and providers to achieve BP goal <130/80 . Current regimen:  o Amlodipine 10mg  daily o Spironolactone 50mg  daily . Interventions: o Will collaborate with PCP about adding valsartan 80mg  daily if BP remains elevated . Patient self care activities - Over the next 45 days, patient will: o Check BP once daily and if symptomatic, document, and provide at future appointments o Ensure daily salt intake < 2300 mg/day  Hyperlipidemia . Pharmacist Clinical Goal(s): o Over the next 45 days, patient will work with PharmD and providers to achieve LDL goal < 70 . Current regimen:  o Pravastatin 80mg  1/2 tablet daily . Interventions: o Provided dietary recommendations . Patient self care activities - Over the next 45 days, patient will: o Focus on a heart-healthy, balanced diet as discussed  Diabetes . Pharmacist Clinical Goal(s): o Over the next 45 days, patient will work with PharmD and providers to achieve A1c goal <7% . Current regimen:  o Ozempic 1mg  weekly o Humalog 25 units before breakfast, 35 units before lunch, and 25 units before dinner o Farxiga 10mg  once daily . Interventions: o Dietary recommendations provided - Advised patient to decrease carbohydrates with each meal to 45-60 grams (goal of 50 grams) - Advised patient not to skip meals, especially if taking insulin - Advised patient to make sure she is eating balanced meals (PLATE method, protein with each meal) - Provided Daily Diabetes Meal Planning Guide and My Carbohydrate Guide to patient o Collaborate with PCP and recommend increasing Tresiba to 16 units  daily and to decreasing Humalog Kwikpen to 25 units with breakfast, 35 units with lunch and 23 units with dinner. PCP agreed to changes  o Reviewed blood glucose readings on FreeStyle Libre meter . Patient self care activities - Over the next 45 days, patient will: o Check blood sugar continuously, but at least 3-4 times daily, document, and provide at future appointments o Contact provider with any episodes of hypoglycemia o Increase water intake by 1 glass a day (goal 64 ounces daily) o Increase to [place] 16 units daily before bedtime  o Decrease Humalog Kwikpen to 25 units with breakfast, 35 units with lunch, and 23 units with dinner o Decrease carbohydrates eaten at each meal (goal 45-60 grams) o Not skip meals o Eat lean protein with every meal  Medication management . Pharmacist Clinical Goal(s): o Over the next 90 days, patient will work with PharmD and providers to maintain optimal medication adherence . Current pharmacy: [facility] . Interventions o Comprehensive medication review performed. o Utilize [facility] pharmacy for medication synchronization, packaging and delivery . Patient self care activities - Over the next 90 days, patient will: o Focus on medication adherence by using adherence packaging o Take medications as prescribed o Report any questions or concerns to PharmD and/or provider(s)  Please see past updates related to this goal by clicking on the "Past Updates" button in the selected goal         The patient verbalized understanding of instructions provided today and agreed to receive a mailed copy of patient instruction and/or educational materials.  Telephone follow up appointment with pharmacy team member scheduled for: 05/30/20 @ 9:30 AM  Jannette Fogo, PharmD Clinical Pharmacist [facility] [phone]   Diabetes Mellitus and Nutrition, Adult When you have diabetes (diabetes mellitus), it is very important to have  healthy eating habits because your blood sugar (glucose) levels are greatly affected by what you eat and drink. Eating healthy foods in the appropriate amounts, at about the same times every day, can help you:  Control your blood glucose.  Lower your risk of heart disease.  Improve your blood pressure.  Reach or maintain a healthy weight. Every person with diabetes is different, and each person has different needs for a meal plan. Your health care provider may recommend that you work with a diet and nutrition specialist (dietitian) to make a meal plan that is best for you. Your meal plan may vary depending on factors such as:  The calories you need.  The medicines you take.  Your weight.  Your blood glucose, blood pressure, and cholesterol levels.  Your activity level.  Other health conditions you have, such as heart or kidney disease. How do carbohydrates affect me? Carbohydrates, also called carbs, affect your blood glucose level more than any other type of food. Eating carbs naturally raises the amount of glucose in your blood. Carb counting is a method for keeping track of how many carbs you eat. Counting carbs is important to keep your blood glucose at a healthy level, especially if you use insulin or take certain oral diabetes medicines. It is important to know how many carbs you can safely have in each meal. This is different for every person. Your dietitian can help you calculate how many carbs you should have at each meal and for each snack. Foods that contain carbs include:  Bread, cereal, rice, pasta, and crackers.  Potatoes and corn.  Peas, beans, and lentils.  Milk and yogurt.  Fruit and juice.  Desserts, such as cakes, cookies, ice cream, and candy. How does alcohol affect me? Alcohol can cause a sudden decrease in blood glucose (hypoglycemia), especially if you use insulin or take certain oral diabetes medicines. Hypoglycemia can be a life-threatening condition.  Symptoms of hypoglycemia (sleepiness, dizziness, and confusion) are similar to symptoms of having too much alcohol. If your health care provider says that alcohol is safe for you, follow these guidelines:  Limit alcohol intake to no more than 1 drink per day for nonpregnant women and 2 drinks per day for men. One drink equals 12 oz of beer, 5 oz of wine, or 1 oz of hard liquor.  Do not drink on an empty stomach.  Keep yourself hydrated with water, diet soda, or unsweetened iced tea.  Keep in mind that regular soda, juice, and other mixers may contain a lot of sugar and must be counted as carbs. What are tips for following this plan?  Reading food labels  Start by checking the serving size on the "Nutrition Facts" label of packaged foods and drinks. The amount of calories, carbs, fats, and other nutrients listed on the label is based on one serving of the item. Many items contain more than one serving per package.  Check the total grams (g) of carbs in one serving. You can calculate the number of servings of carbs in one serving by dividing the total carbs by 15. For example, if a food has 30 g of total carbs, it would be equal to 2 servings of carbs.  Check the  number of grams (g) of saturated and trans fats in one serving. Choose foods that have low or no amount of these fats.  Check the number of milligrams (mg) of salt (sodium) in one serving. Most people should limit total sodium intake to less than 2,300 mg per day.  Always check the nutrition information of foods labeled as "low-fat" or "nonfat". These foods may be higher in added sugar or refined carbs and should be avoided.  Talk to your dietitian to identify your daily goals for nutrients listed on the label. Shopping  Avoid buying canned, premade, or processed foods. These foods tend to be high in fat, sodium, and added sugar.  Shop around the outside edge of the grocery store. This includes fresh fruits and vegetables, bulk  grains, fresh meats, and fresh dairy. Cooking  Use low-heat cooking methods, such as baking, instead of high-heat cooking methods like deep frying.  Cook using healthy oils, such as olive, canola, or sunflower oil.  Avoid cooking with butter, cream, or high-fat meats. Meal planning  Eat meals and snacks regularly, preferably at the same times every day. Avoid going long periods of time without eating.  Eat foods high in fiber, such as fresh fruits, vegetables, beans, and whole grains. Talk to your dietitian about how many servings of carbs you can eat at each meal.  Eat 4-6 ounces (oz) of lean protein each day, such as lean meat, chicken, fish, eggs, or tofu. One oz of lean protein is equal to: ? 1 oz of meat, chicken, or fish. ? 1 egg. ?  cup of tofu.  Eat some foods each day that contain healthy fats, such as avocado, nuts, seeds, and fish. Lifestyle  Check your blood glucose regularly.  Exercise regularly as told by your health care provider. This may include: ? 150 minutes of moderate-intensity or vigorous-intensity exercise each week. This could be brisk walking, biking, or water aerobics. ? Stretching and doing strength exercises, such as yoga or weightlifting, at least 2 times a week.  Take medicines as told by your health care provider.  Do not use any products that contain nicotine or tobacco, such as cigarettes and e-cigarettes. If you need help quitting, ask your health care provider.  Work with a Social worker or diabetes educator to identify strategies to manage stress and any emotional and social challenges. Questions to ask a health care provider  Do I need to meet with a diabetes educator?  Do I need to meet with a dietitian?  What number can I call if I have questions?  When are the best times to check my blood glucose? Where to find more information:  American Diabetes Association: diabetes.org  Academy of Nutrition and Dietetics:  www.eatright.[employer] of Diabetes and Digestive and Kidney Diseases (NIH): [employer] Summary  A healthy meal plan will help you control your blood glucose and maintain a healthy lifestyle.  Working with a diet and nutrition specialist (dietitian) can help you make a meal plan that is best for you.  Keep in mind that carbohydrates (carbs) and alcohol have immediate effects on your blood glucose levels. It is important to count carbs and to use alcohol carefully. This information is not intended to replace advice given to you by your health care provider. Make sure you discuss any questions you have with your health care provider. Document Revised: 07/26/2017 Document Reviewed: 09/17/2016 Elsevier Patient Education  2020 [employer].

## 2020-05-25 NOTE — Chronic Care Management (AMB) (Signed)
Chronic Care Management Pharmacy  Name: [name]  MRN: [id number] DOB: 01/10/1951  Chief Complaint/ HPI  [name],  69 y.o. , female presents for their Follow-Up CCM visit with the clinical pharmacist In office. Pt states that she feels better this week than she has in the past few weeks.   PCP : [name], FNP  Their chronic conditions include: Hypertension, Hyperlipidemia, Asthma, GERD, Diabetes with stage III CKD, Osteoarthritis  Office Visits: 05/03/20 OV: DM follow up. Pt is using FreeStyle Libre meter. HgbA1c up to 11.4%. B12 level within normal limits. Use sliding scale insulin for meal time blood sugars. Will likely start on long-acting insulin (Tresiba samples given, but advised to wait to start until directions given).   12/31/19 AWV and OV: Presented for Annual exam. Labs ordered (HgbA1c, Lipid panel, CMP, CBC, Vitamin D). BG has been more elevated recently. Started on Farxiga 48m daily. Fair control of BP, continue current medications. Continue to use pain cream for knee pain as needed; if worsens, will consider referral to orthopedics.   10/22/19 OV: Presented for follow up diabetic visit; labs ordered (lipid panel, CMP14+EGFR, HGB A1c, Vit B12, Vit D). Vitamin D and B12 low. Continue Vitamin D supplement and consider B12 injections. Dietary and exercise recommendations provided. Follow up in 3 months.  09/03/19 OV: Presented for follow up diabetic visit; Lunchtime Humalog increased due to elevated BG (30 units before breakfast, 35 units before lunch, 25 units before supper); constipation relieved with Amitiza.   07/22/19 OV: Presented for constipation, bloating and flatulence. Stool softeners and Metamucil have not relieved symptoms. Referred to GI for dysphagia, Labs ordered (lipid panel, CMP14+EGFR, HgbA1c), diabetic foot exam performed, continue checking blood sugar and injecting insulin three times daily w/ ozempic weekly. Start Amitiza for constipation.  Follow up in 4-6 weeks. Influenza vaccine administered.   Consult Visit: 03/30/20 Orthopedic surgery OV w/ Dr. YLorin Mercy Follow up post L4 hemilaminectomy on the right and removal of a cephalad migrated free fragment with radiculopathy. Can do straight leg raise (unable to do before surgery). Continue leg raises using purse as counterweight. Pt happy that leg pain is significantly better. Return in 3 months.   03/02/20 Orthopedic surgery OV w/ [name] and steri-strips applied. No drainage or signs of infection. Pt will gradually increase walking and avoid bending, twisting, and heavy lifting. Follow up w/ Dr. YLorin Mercyin 4 weeks.   02/19/20 Right L4-5 microdiscetomy hemilaminectomy and removal of free fragment.  02/17/20 Orthopedic surgery OV w/ Dr. YLorin Mercy Referred to surgery for herniated nucleus polposus. In significant pain during exam. Pt had already eaten at before OV. Surgery scheduled for Friday, 6/25, for removal of migrated fragment.    02/16/20 Ortho OV w/ Dr. HJunius Roads Presented for low back pain and right sided radiculopathy for the past 2 weeks. Complains of urinary incontinence since new onset of pain. Rx for Dilaudid short course. [facility] Dr. YLorin Mercyfor possible surgical intervention.   02/13/20 ED visit: Presented for leg pain. Seen in ED 3 days ago for same issue. She fell on buttocks yesterday while using her walker (her leg gave out). Pain is worse with walking. Pt reports shooting pain, pressure, and weakness. MRI showed new L4-5 disc extrusion with right L4 nerve root impingement in lateral recess. 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 No acute fracture. Discharged with 6 days prednisone taper.  02/10/20 ED visit: Presented with right leg pain for past several days. Pt denies trauma or injury. Pain starts in lower back and radiates to thigh, shin, and down her leg. Labs were  unremarkable. Hip Xrays unremarkable. CT showed multilevel disease worse on right L4-5. Administered multiple rounds of pain medicine, muscle relaxants, steroids, and NSAIDs. Pain improved, no MRI needed. Refer to spine surgeon outpatient. Prescribed short course of Norco and Baclofen at discharge. Follow up with PCP in 1-2 days.    10/18/19- ED visit: Presented for sudden onset left ear pain 4 days prior with clear, watery drainage; left eardrum found to be perforated upon exam; recommended naproxen or Tylenol for pain and follow up with ENT.   CCM Encounters: 01/15/20 RN: Reviewed and updated care plan and pt goals  09/16/19 PharmD: Comprehensive medication review, patient education. Pt reported issues receiving FreeStyle Libre.   08/07/19 PharmD: Pt recently seen by GI. Failed capsule swallow evaluation. GI to schedule esophageal dilation procedure. Encouraged pt to crush meds as directed due to difficulty swallowing.  07/31/19 PharmD: Working to obtain [employer] CGM. Pt approved for another 6 moths by [place].   07/29/19 PharmD:  Collaboration with [place] for patient's FreeStyle Libre testing supplies  07/27/19 PharmD: Patient requesting FreeStyle Libre assistance, denied at pharmacy. [facility] contacted.   07/22/19 RN: Patient education, medication review, coordination of diabetes testing supplies  07/17/19 RN: Compliance with GI and Urology treatment plans encourage, patient education.  Medications: Outpatient Encounter Medications as of 05/25/2020  Medication Sig  . amLODipine (NORVASC) 10 MG tablet TAKE ONE TABLET BY MOUTH EVERYDAY AT BEDTIME  . Continuous Blood Gluc Receiver (FREESTYLE LIBRE READER) DEVI 1 each by Does not apply route See admin instructions. CGM device-Freestyle Libre  . Continuous Blood Gluc Sensor (FREESTYLE LIBRE 14 DAY SENSOR) MISC 1 each by Does not apply route See admin instructions. CGM 14-day sensors; send refills to [employer] (in [place])   . dapagliflozin propanediol (FARXIGA) 5 MG TABS tablet Take 2 tablets (10 mg total) by mouth daily.  [name] docusate sodium (COLACE) 100 MG capsule Take 100 mg by mouth daily as needed for mild constipation.   . gabapentin (NEURONTIN) 300 MG capsule TAKE 1 CAPSULE(300 MG) BY MOUTH THREE TIMES DAILY  . insulin degludec (TRESIBA FLEXTOUCH) 100 UNIT/ML FlexTouch Pen Inject 12 Units into the skin daily.  . insulin lispro (HUMALOG KWIKPEN) 100 UNIT/ML KwikPen Take 25 units [place] breakfast and dinner and 35 units at lunch  . Insulin Pen Needle (BD PEN NEEDLE MICRO U/F) 32G X 6 MM MISC DX:E11.65 USE TO CHECK BLOOD SUGARS THREE TIMES A DAY  . Lancets Misc. MISC 1 each by Does not apply route 4 (four) times daily -  with meals and at bedtime.  [name] lubiprostone (AMITIZA) 8 MCG capsule Take 1 capsule (8 mcg total) by mouth daily with breakfast.  . mirabegron ER (MYRBETRIQ) 50 MG TB24 tablet Take 1 tablet (50 mg total) by mouth daily.  . mometasone-formoterol (DULERA) 100-5 MCG/ACT AERO Inhale 2 puffs into the lungs as needed for wheezing.  (Patient not taking: Reported on 05/03/2020)  . Multiple Vitamin (MULTIVITAMIN WITH MINERALS) TABS tablet Take 1 tablet by mouth daily.  . ondansetron (ZOFRAN) 4 MG tablet Take 1 tablet (4 mg total) by mouth every 6 (six) hours as needed for nausea or vomiting.  . pantoprazole (PROTONIX) 40 MG tablet Take 30- 60 min before your first and last meals of the day  . pravastatin (PRAVACHOL)  80 MG tablet Take 0.5 tablets (40 mg total) by mouth daily.  . Semaglutide, 1 MG/DOSE, (OZEMPIC, 1 MG/DOSE,) 2 MG/1.5ML SOPN INJECT 1MG INTO HE SKIN EVERY Thursday  . spironolactone (ALDACTONE) 50 MG tablet TAKE ONE TABLET BY MOUTH EVERY MORNING  . Vitamin D, Ergocalciferol, (DRISDOL) 1.25 MG (50000 UNIT) CAPS capsule Take 1 capsule (50,000 Units total) by mouth every 7 (seven) days. TAKE 1 CAPSULE BY MOUTH EVERY 7 DAYS ON WEDNESDAYS   No facility-administered encounter medications  on file as of 05/25/2020.   Current Diagnosis/Assessment:  SDOH Interventions     Most Recent Value  SDOH Interventions  Financial Strain Interventions Intervention Not Indicated      Goals Addressed            This Visit's Progress   . Pharmacy Care Plan       CARE PLAN ENTRY  Current Barriers:  . Chronic Disease Management support, education, and care coordination needs related to Hypertension, Hyperlipidemia, and Diabetes   Hypertension . Pharmacist Clinical Goal(s): o Over the next 45 days, patient will work with PharmD and providers to achieve BP goal <130/80 . Current regimen:  o Amlodipine 20m daily o Spironolactone 544mdaily . Interventions: o Will collaborate with PCP about adding valsartan 8073maily if BP remains elevated . Patient self care activities - Over the next 45 days, patient will: o Check BP once daily and if symptomatic, document, and provide at future appointments o Ensure daily salt intake < 2300 mg/day  Hyperlipidemia . Pharmacist Clinical Goal(s): o Over the next 45 days, patient will work with PharmD and providers to achieve LDL goal < 70 . Current regimen:  o Pravastatin 63m43m2 tablet daily . Interventions: o Provided dietary recommendations . Patient self care activities - Over the next 45 days, patient will: o Focus on a heart-healthy, balanced diet as discussed  Diabetes . Pharmacist Clinical Goal(s): o Over the next 45 days, patient will work with PharmD and providers to achieve A1c goal <7% . Current regimen:  o Ozempic 1mg 38mkly o Humalog 25 units before breakfast, 35 units before lunch, and 25 units before dinner o Farxiga 10mg 56m daily . Interventions: o Dietary recommendations provided - Advised patient to decrease carbohydrates with each meal to 45-60 grams (goal of 50 grams) - Advised patient not to skip meals, especially if taking insulin - Advised patient to make sure she is eating balanced meals (PLATE method,  protein with each meal) - Provided Daily Diabetes Meal Planning Guide and My Carbohydrate Guide to patient o Collaborate with PCP and recommend increasing Tresiba to 16 units daily and to decreasing Humalog Kwikpen to 25 units with breakfast, 35 units with lunch and 23 units with dinner. PCP agreed to changes  o Reviewed blood glucose readings on FreeStyle Libre meter . Patient self care activities - Over the next 45 days, patient will: o Check blood sugar continuously, but at least 3-4 times daily, document, and provide at future appointments o Contact provider with any episodes of hypoglycemia o Increase water intake by 1 glass a day (goal 64 ounces daily) o Increase to [place] daily before bedtime  o Decrease Humalog Kwikpen to 25 units with breakfast, 35 units with lunch, and 23 units with dinner o Decrease carbohydrates eaten at each meal (goal 45-60 grams) o Not skip meals o Eat lean protein with every meal  Medication management . Pharmacist Clinical Goal(s): o Over the next 90 days, patient will work with PharmD  and providers to maintain optimal medication adherence . Current pharmacy: [facility] . Interventions o Comprehensive medication review performed. o Utilize [facility] pharmacy for medication synchronization, packaging and delivery . Patient self care activities - Over the next 90 days, patient will: o Focus on medication adherence by using adherence packaging o Take medications as prescribed o Report any questions or concerns to PharmD and/or provider(s)  Please see past updates related to this goal by clicking on the "Past Updates" button in the selected goal                           Diabetes   Recent Relevant Labs: Lab Results  Component Value Date/Time   HGBA1C 11.4 (H) 05/03/2020 02:55 PM   HGBA1C 5.9 (H) 12/31/2019 04:11 PM   MICROALBUR 80 12/31/2019 03:19 PM   MICROALBUR 6.9 (H) 09/07/2016 01:41 PM   MICROALBUR 7.2 (H) 08/30/2015 01:59 PM     Kidney Function Lab Results  Component Value Date/Time   CREATININE 1.24 (H) 05/03/2020 02:55 PM   CREATININE 1.21 (H) 02/19/2020 09:51 AM   GFR 51.07 (L) 05/17/2017 08:54 AM   GFRNONAA 45 (L) 05/03/2020 02:55 PM   GFRAA 52 (L) 05/03/2020 02:55 PM  Stage 3a CKD   Checking BG: Multiple times daily with continuous glucose monitor (FreeStyle Libre)  Recent FBG Readings: 169,183,252,208 Recent pre-meal BG readings: 47, 259,339,101,279,208 Recent 2hr PP BG readings:  250,109, 199, 177,115,286,282 Recent HS BG readings:  155,64 Recent Random BG readings: 947,65,465,03,54,65 Patient has failed these meds in past: Invokana, Levemir, Lantus, Victoza, metformin, Actos, Janumet Patient is currently controlled on the following medications:   Ozempic 14m weekly on Thursdays   Humalog Flexpen 25-35 units before meals (25U am, 35U lunch, 25U dinner)  Also doing additional sliding scale sometimes (5-10 units)  Farxiga 150mdaily   Gabapentin 30063mhree times daily  Last diabetic Foot exam: 07/22/19 Last diabetic Eye exam: 08/10/19   We discussed:   Reviewed BG readings on FreeStyle Libre meter and food log pt has been keeping  Unable to dowMerrill Lynchta from reader, but advised pt to hook up meter to laptop to download results to her account and I should be able to see them  Pt has experienced 3 low BG readings in the last 14 days (47(68,12,75One of these readings occurred when pt did not eat lunch  Advised pt not to skip meals, especially when she is administering insulin . Diet extensively o Recent meals include: - Breakfast: Oatmeal and toast, pancakes with lite syrup and grapefruit juice, eggs, bacon, and toast - Lunch/Dinner: Chicken, mashed potatoes, roll; baked chicken, broccoli, roll; hamburger and french fries; [employer] roast beef o Pt asked how many carbohydrates she should be eating per meal/per day - Currently eating between 60 and 80 grams of  carbohydrates with lunch and dinner meals, 30-45 grams of carbohydrates with breakfast - Advised pt to eat 45-60 grams of carbohydrates with meals (shoot for 50 grams) . Provided pt with Daily Diabetes Meal Planning Guide and My carbohydrate guide while in office - Recommend pt cut back on how many carbohydrates she is eating o Pt states she does not drink sweet tea or sodas like she used too o She eats more lean meat (specifically salmon) o She reports trying to use the PLATE method o Pt is NOT interested in meeting with a dietician again (she has met with them in the past) o Advised pt  to make sure she is eating balanced meals (eat protein with each meal) . Metformin o Pt took this medication in the past and she reports that she did not like it. States that the pills were large and it made her jittery. She does not want to try this medication again . Collaborate with PCP regarding pt's recent BG readings and insulin dosing o Recommend increasing Tresiba to 16 units nightly and decreasing Humalog Flexpen to 25U AM/ 35U lunch/ 23U dinner (decrease dinner dose by 2 units) o PCP agreed  Plan Continue current medications  Increase Tresiba to 16 units nightly Decrease Humalog to 25 units at breakfast, 35 units at lunch and 23 units at bedtime  Hypertension   Office blood pressures are  BP Readings from Last 3 Encounters:  05/03/20 140/76  03/30/20 129/81  03/22/20 (!) 141/84   Patient has failed these meds in the past: Carvedilol, clonidine, metoprolol, valsartan/HCTZ, losartan/HCTZ Patient is currently uncontrolled on the following medications:   Amlodipine 66m daily  Spironolactone 529mdaily  Patient checks BP at home daily  Patient home BP readings are ranging: Pt states BP is better at home, none provided today  We discussed:  Pt states BP is getting better  Plan Continue current medications  Discuss BP at follow up CoJAARSith PCP about adding valsartan 8070mf  BP remains elevated   Hyperlipidemia   Lipid Panel     Component Value Date/Time   CHOL 168 12/31/2019 1611   TRIG 88 12/31/2019 1611   HDL 54 12/31/2019 1611   CHOLHDL 3.1 12/31/2019 1611   CHOLHDL 4 09/07/2016 1341   VLDL 15.6 09/07/2016 1341   LDLCALC 98 12/31/2019 1611   LDLDIRECT 152.8 10/09/2013 0938   LABVLDL 16 12/31/2019 1611     The 10-year ASCVD risk score (GoMikey Bussing Jr., et al., 2013) is: 23.6%   Values used to calculate the score:     Age: [age]     Sex: Female     Is Non-Hispanic African American: Yes     Diabetic: Yes     Tobacco smoker: No     Systolic Blood Pressure: 140532Hg     Is BP treated: Yes     HDL Cholesterol: 54 mg/dL     Total Cholesterol: 168 mg/dL   Patient has failed these meds in past: N/A Patient is currently uncontrolled on the following medications:   Pravastatin 80 mg daily, 1/2 tablet daily  Aspirin 29m83mily  Plan Continue current medications   History of Asthma    Tobacco Status:  Social History   Tobacco Use  Smoking Status Never Smoker  Smokeless Tobacco Never Used   Patient has failed these meds in past: Advair Patient is currently controlled on the following medications:   Proair 2 puffs every 4 hours as needed  Dulera 2 puffs every 12 hours as needed  Using maintenance inhaler regularly? No Frequency of rescue inhaler use:  infrequently as needed  Plan Continue current medications  Constipation   Patient has failed these meds in past: Stool softeners, metamucil Patient is currently controlled on the following medications:   Amitiza 8mcg68mily  Docusate 100mg 50my  Plan Continue current medications  Overactive bladder    Patient has failed these meds in past: Vesicare, oxybutynin Patient is currently controlled on the following medications:   Myrbetriq 50mg d80m  Plan Continue current medications  Vitamin D Deficiency  Vitamin D: 29.8 on 10/22/19  Patient has failed these meds  in past:  N/A Patient is currently uncontrolled on the following medications:   Ergocalciferol 50,000 units daily on Thursdays  Plan Continue current medications  Vaccines   Reviewed and discussed patient's vaccination history.    Immunization History  Administered Date(s) Administered  . Fluad Quad(high Dose 65+) 05/03/2020  . Influenza Whole 06/27/2009, 06/13/2010  . Influenza, High Dose Seasonal PF 07/22/2019  . Influenza,inj,Quad PF,6+ Mos 06/03/2017  . Influenza-Unspecified 03/27/2013, 03/27/2014, 05/18/2015  . Pneumococcal Conjugate-13 07/24/2017  . Pneumococcal Polysaccharide-23 08/27/2006, 11/29/2009, 03/24/2019  . Td 10/18/2009  . Tdap 12/27/2017    Pt states she received Shingrix at [facility].  Plan No recommendations at this time  Medication Management   Pt uses [facility] for all medications  Pt endorses 70% compliance  We discussed:   Importance of medication adherence  Plan Utilize [facility] pharmacy for medication synchronization, packaging and delivery   Follow up: 5 day phone call   Jannette Fogo, PharmD Clinical Pharmacist [facility] [phone]

## 2020-05-30 ENCOUNTER — Other Ambulatory Visit: Payer: Self-pay

## 2020-05-30 ENCOUNTER — Ambulatory Visit: Payer: Self-pay

## 2020-05-30 DIAGNOSIS — I10 Essential (primary) hypertension: Secondary | ICD-10-CM

## 2020-05-30 DIAGNOSIS — E1165 Type 2 diabetes mellitus with hyperglycemia: Secondary | ICD-10-CM

## 2020-05-30 NOTE — Patient Instructions (Addendum)
Visit Information  Goals Addressed            This Visit's Progress   . Pharmacy Care Plan       CARE PLAN ENTRY  Current Barriers:  . Chronic Disease Management support, education, and care coordination needs related to Hypertension, Hyperlipidemia, and Diabetes   Hypertension . Pharmacist Clinical Goal(s): o Over the next 45 days, patient will work with PharmD and providers to achieve BP goal <130/80 . Current regimen:  o Amlodipine 10mg  daily o Spironolactone 50mg  daily . Interventions: o Discussed proper blood pressure monitoring technique (rest for at least 5 minutes, arm at heart level resting on a hard surface, after taking blood pressure medications, etc) o Reviewed recent home blood pressure readings o Will collaborate with PCP about adding valsartan 80mg  daily if BP remains elevated at follow up visit . Patient self care activities - Over the next 45 days, patient will: o Check BP once daily and if symptomatic, document, and provide at future appointments o Ensure daily salt intake < 2300 mg/day  Hyperlipidemia . Pharmacist Clinical Goal(s): o Over the next 45 days, patient will work with PharmD and providers to achieve LDL goal < 70 . Current regimen:  o Pravastatin 80mg  1/2 tablet daily . Interventions: o Provided dietary recommendations . Patient self care activities - Over the next 45 days, patient will: o Focus on a heart-healthy, balanced diet as discussed  Diabetes . Pharmacist Clinical Goal(s): o Over the next 45 days, patient will work with PharmD and providers to achieve A1c goal <7% . Current regimen:  o Ozempic 1mg  weekly o Humalog 25 units before breakfast, 35 units before lunch, and 23 units before dinner o Farxiga 10mg  once daily o Tresiba 16 units nightly . Interventions: o Dietary recommendations provided - Advised patient to decrease carbohydrates with each meal to 45-60 grams (goal of 50 grams) - Advised patient not to skip meals,  especially if taking insulin - Advised patient to make sure she is eating balanced meals (PLATE method, protein with each meal) - Provided Daily Diabetes Meal Planning Guide and My Carbohydrate Guide to patient o Reviewed recent blood glucose readings o Collaborate with PCP and recommend increasing Tresiba to 18 units daily PCP agreed to changes  o  Discussed how diet is affecting blood sugar (ie. Elevated blood sugar after having cereal with bananas, toast, and orange juice) o Advised patient it is fine to administer Tresiba 2 hours prior to bedtime . Patient self care activities - Over the next 45 days, patient will: o Check blood sugar continuously, but at least 3-4 times daily, document, and provide at future appointments o Contact provider with any episodes of hypoglycemia o Increase water intake by 1 glass a day (goal 64 ounces daily) o Increase to [place] 18 units daily before bedtime  o Decrease carbohydrates eaten at each meal (goal 45-60 grams) o Not skip meals o Eat lean protein with every meal  Medication management . Pharmacist Clinical Goal(s): o Over the next 90 days, patient will work with PharmD and providers to maintain optimal medication adherence . Current pharmacy: [facility] . Interventions o Comprehensive medication review performed. o Utilize [facility] for medication synchronization, packaging and delivery . Patient self care activities - Over the next 90 days, patient will: o Focus on medication adherence by using adherence packaging o Take medications as prescribed o Report any questions or concerns to PharmD and/or provider(s)  Please see past updates related to this goal by  clicking on the "Past Updates" button in the selected goal        The patient verbalized understanding of instructions provided today and agreed to receive a mailed copy of patient instruction and/or educational materials.  Telephone follow up appointment with pharmacy  team member scheduled for:06/03/20 @ 11:00 AM  Jannette Fogo, PharmD Clinical Pharmacist [facility] [phone]   Diabetes Mellitus and Exercise Exercising regularly is important for your overall health, especially when you have diabetes (diabetes mellitus). Exercising is not only about losing weight. It has many other health benefits, such as increasing muscle strength and bone density and reducing body fat and stress. This leads to improved fitness, flexibility, and endurance, all of which result in better overall health. Exercise has additional benefits for people with diabetes, including:  Reducing appetite.  Helping to lower and control blood glucose.  Lowering blood pressure.  Helping to control amounts of fatty substances (lipids) in the blood, such as cholesterol and triglycerides.  Helping the body to respond better to insulin (improving insulin sensitivity).  Reducing how much insulin the body needs.  Decreasing the risk for heart disease by: ? Lowering cholesterol and triglyceride levels. ? Increasing the levels of good cholesterol. ? Lowering blood glucose levels. What is my activity plan? Your health care provider or certified diabetes educator can help you make a plan for the type and frequency of exercise (activity plan) that works for you. Make sure that you:  Do at least 150 minutes of moderate-intensity or vigorous-intensity exercise each week. This could be brisk walking, biking, or water aerobics. ? Do stretching and strength exercises, such as yoga or weightlifting, at least 2 times a week. ? Spread out your activity over at least 3 days of the week.  Get some form of physical activity every day. ? Do not go more than 2 days in a row without some kind of physical activity. ? Avoid being inactive for more than 30 minutes at a time. Take frequent breaks to walk or stretch.  Choose a type of exercise or activity that you enjoy, and  set realistic goals.  Start slowly, and gradually increase the intensity of your exercise over time. What do I need to know about managing my diabetes?   Check your blood glucose before and after exercising. ? If your blood glucose is 240 mg/dL (13.3 mmol/L) or higher before you exercise, check your urine for ketones. If you have ketones in your urine, do not exercise until your blood glucose returns to normal. ? If your blood glucose is 100 mg/dL (5.6 mmol/L) or lower, eat a snack containing 15-20 grams of carbohydrate. Check your blood glucose 15 minutes after the snack to make sure that your level is above 100 mg/dL (5.6 mmol/L) before you start your exercise.  Know the symptoms of low blood glucose (hypoglycemia) and how to treat it. Your risk for hypoglycemia increases during and after exercise. Common symptoms of hypoglycemia can include: ? Hunger. ? Anxiety. ? Sweating and feeling clammy. ? Confusion. ? Dizziness or feeling light-headed. ? Increased heart rate or palpitations. ? Blurry vision. ? Tingling or numbness around the mouth, lips, or tongue. ? Tremors or shakes. ? Irritability.  Keep a rapid-acting carbohydrate snack available before, during, and after exercise to help prevent or treat hypoglycemia.  Avoid injecting insulin into areas of the body that are going to be exercised. For example, avoid injecting insulin into: ? The arms, when playing tennis. ? The legs, when jogging.  Keep records of your exercise habits. Doing this can help you and your health care provider adjust your diabetes management plan as needed. Write down: ? Food that you eat before and after you exercise. ? Blood glucose levels before and after you exercise. ? The type and amount of exercise you have done. ? When your insulin is expected to peak, if you use insulin. Avoid exercising at times when your insulin is peaking.  When you start a new exercise or activity, work with your health care  provider to make sure the activity is safe for you, and to adjust your insulin, medicines, or food intake as needed.  Drink plenty of water while you exercise to prevent dehydration or heat stroke. Drink enough fluid to keep your urine clear or pale yellow. Summary  Exercising regularly is important for your overall health, especially when you have diabetes (diabetes mellitus).  Exercising has many health benefits, such as increasing muscle strength and bone density and reducing body fat and stress.  Your health care provider or certified diabetes educator can help you make a plan for the type and frequency of exercise (activity plan) that works for you.  When you start a new exercise or activity, work with your health care provider to make sure the activity is safe for you, and to adjust your insulin, medicines, or food intake as needed. This information is not intended to replace advice given to you by your health care provider. Make sure you discuss any questions you have with your health care provider. Document Revised: 03/07/2017 Document Reviewed: 01/23/2016 Elsevier Patient Education  [place].

## 2020-05-30 NOTE — Chronic Care Management (AMB) (Signed)
Chronic Care Management Pharmacy  Name: [name]  MRN: [id number] DOB: Jan 21, 1951  Chief Complaint/ HPI  [name],  68 y.o. , female presents for their Follow-Up CCM visit with the clinical pharmacist via telephone due to COVID-19 Pandemic.   PCP : [name], FNP  Their chronic conditions include: Hypertension, Hyperlipidemia, Asthma, GERD, Diabetes with stage III CKD, Osteoarthritis  Office Visits: 05/03/20 OV: DM follow up. Pt is using FreeStyle Libre meter. HgbA1c up to 11.4%. B12 level within normal limits. Use sliding scale insulin for meal time blood sugars. Will likely start on long-acting insulin (Tresiba samples given, but advised to wait to start until directions given).   12/31/19 AWV and OV: Presented for Annual exam. Labs ordered (HgbA1c, Lipid panel, CMP, CBC, Vitamin D). BG has been more elevated recently. Started on Farxiga 74m daily. Fair control of BP, continue current medications. Continue to use pain cream for knee pain as needed; if worsens, will consider referral to orthopedics.   10/22/19 OV: Presented for follow up diabetic visit; labs ordered (lipid panel, CMP14+EGFR, HGB A1c, Vit B12, Vit D). Vitamin D and B12 low. Continue Vitamin D supplement and consider B12 injections. Dietary and exercise recommendations provided. Follow up in 3 months.  09/03/19 OV: Presented for follow up diabetic visit; Lunchtime Humalog increased due to elevated BG (30 units before breakfast, 35 units before lunch, 25 units before supper); constipation relieved with Amitiza.   07/22/19 OV: Presented for constipation, bloating and flatulence. Stool softeners and Metamucil have not relieved symptoms. Referred to GI for dysphagia, Labs ordered (lipid panel, CMP14+EGFR, HgbA1c), diabetic foot exam performed, continue checking blood sugar and injecting insulin three times daily w/ ozempic weekly. Start Amitiza for constipation. Follow up in 4-6 weeks. Influenza vaccine  administered.   Consult Visit: 03/30/20 Orthopedic surgery OV w/ Dr. YLorin Mercy Follow up post L4 hemilaminectomy on the right and removal of a cephalad migrated free fragment with radiculopathy. Can do straight leg raise (unable to do before surgery). Continue leg raises using purse as counterweight. Pt happy that leg pain is significantly better. Return in 3 months.   03/02/20 Orthopedic surgery OV w/ [name] and steri-strips applied. No drainage or signs of infection. Pt will gradually increase walking and avoid bending, twisting, and heavy lifting. Follow up w/ Dr. YLorin Mercyin 4 weeks.   02/19/20 Right L4-5 microdiscetomy hemilaminectomy and removal of free fragment.  02/17/20 Orthopedic surgery OV w/ Dr. YLorin Mercy Referred to surgery for herniated nucleus polposus. In significant pain during exam. Pt had already eaten at before OV. Surgery scheduled for Friday, 6/25, for removal of migrated fragment.    02/16/20 Ortho OV w/ Dr. HJunius Roads Presented for low back pain and right sided radiculopathy for the past 2 weeks. Complains of urinary incontinence since new onset of pain. Rx for Dilaudid short course[facility] YLorin Mercyfor possible surgical intervention.   02/13/20 ED visit: Presented for leg pain. Seen in ED 3 days ago for same issue. She fell on buttocks yesterday while using her walker (her leg gave out). Pain is worse with walking. Pt reports shooting pain, pressure, and weakness. MRI showed new L4-5 disc extrusion with right L4 nerve root impingement in lateral recess. 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 No acute fracture. Discharged with 6 days prednisone taper.    02/10/20 ED visit: Presented with right leg pain for past several  days. Pt denies trauma or injury. Pain starts in lower back and radiates to thigh, shin, and down her leg. Labs were unremarkable. Hip Xrays unremarkable. CT  showed multilevel disease worse on right L4-5. Administered multiple rounds of pain medicine, muscle relaxants, steroids, and NSAIDs. Pain improved, no MRI needed. Refer to spine surgeon outpatient. Prescribed short course of Norco and Baclofen at discharge. Follow up with PCP in 1-2 days.    10/18/19- ED visit: Presented for sudden onset left ear pain 4 days prior with clear, watery drainage; left eardrum found to be perforated upon exam; recommended naproxen or Tylenol for pain and follow up with ENT.   CCM Encounters: 01/15/20 RN: Reviewed and updated care plan and pt goals  09/16/19 PharmD: Comprehensive medication review, patient education. Pt reported issues receiving FreeStyle Libre.   08/07/19 PharmD: Pt recently seen by GI. Failed capsule swallow evaluation. GI to schedule esophageal dilation procedure. Encouraged pt to crush meds as directed due to difficulty swallowing.  07/31/19 PharmD: Working to obtain [employer] CGM. Pt approved for another 6 moths by [place].   07/29/19 PharmD:  Collaboration with [employer] for patient's FreeStyle Libre testing supplies  07/27/19 PharmD: Patient requesting FreeStyle Libre assistance, denied at pharmacy. [facility] contacted.   07/22/19 RN: Patient education, medication review, coordination of diabetes testing supplies  07/17/19 RN: Compliance with GI and Urology treatment plans encourage, patient education.  Medications: Outpatient Encounter Medications as of 05/30/2020  Medication Sig  . amLODipine (NORVASC) 10 MG tablet TAKE ONE TABLET BY MOUTH EVERYDAY AT BEDTIME  . Continuous Blood Gluc Receiver (FREESTYLE LIBRE READER) DEVI 1 each by Does not apply route See admin instructions. CGM device-Freestyle Libre  . Continuous Blood Gluc Sensor (FREESTYLE LIBRE 14 DAY SENSOR) MISC 1 each by Does not apply route See admin instructions. CGM 14-day sensors; send refills to [facility] (in [place])   . dapagliflozin  propanediol (FARXIGA) 5 MG TABS tablet Take 2 tablets (10 mg total) by mouth daily.  [name] docusate sodium (COLACE) 100 MG capsule Take 100 mg by mouth daily as needed for mild constipation.   . gabapentin (NEURONTIN) 300 MG capsule TAKE 1 CAPSULE(300 MG) BY MOUTH THREE TIMES DAILY  . insulin degludec (TRESIBA FLEXTOUCH) 100 UNIT/ML FlexTouch Pen Inject 12 Units into the skin daily.  . insulin lispro (HUMALOG KWIKPEN) 100 UNIT/ML KwikPen Take 25 units [place] breakfast and dinner and 35 units at lunch  . Insulin Pen Needle (BD PEN NEEDLE MICRO U/F) 32G X 6 MM MISC DX:E11.65 USE TO CHECK BLOOD SUGARS THREE TIMES A DAY  . Lancets Misc. MISC 1 each by Does not apply route 4 (four) times daily -  with meals and at bedtime.  [name] lubiprostone (AMITIZA) 8 MCG capsule Take 1 capsule (8 mcg total) by mouth daily with breakfast.  . mirabegron ER (MYRBETRIQ) 50 MG TB24 tablet Take 1 tablet (50 mg total) by mouth daily.  . mometasone-formoterol (DULERA) 100-5 MCG/ACT AERO Inhale 2 puffs into the lungs as needed for wheezing.  (Patient not taking: Reported on 05/03/2020)  . Multiple Vitamin (MULTIVITAMIN WITH MINERALS) TABS tablet Take 1 tablet by mouth daily.  . ondansetron (ZOFRAN) 4 MG tablet Take 1 tablet (4 mg total) by mouth every 6 (six) hours as needed for nausea or vomiting.  . pantoprazole (PROTONIX) 40 MG tablet Take 30- 60 min before your first and last meals of the day  . pravastatin (PRAVACHOL) 80 MG tablet Take 0.5 tablets (40 mg total) by mouth  daily.  . Semaglutide, 1 MG/DOSE, (OZEMPIC, 1 MG/DOSE,) 2 MG/1.5ML SOPN INJECT 1MG INTO HE SKIN EVERY Thursday  . spironolactone (ALDACTONE) 50 MG tablet TAKE ONE TABLET BY MOUTH EVERY MORNING  . Vitamin D, Ergocalciferol, (DRISDOL) 1.25 MG (50000 UNIT) CAPS capsule Take 1 capsule (50,000 Units total) by mouth every 7 (seven) days. TAKE 1 CAPSULE BY MOUTH EVERY 7 DAYS ON WEDNESDAYS   No facility-administered encounter medications on file as of 05/30/2020.   Current  Diagnosis/Assessment:  SDOH Interventions     Most Recent Value  SDOH Interventions  Financial Strain Interventions Intervention Not Indicated      Goals Addressed            This Visit's Progress   . Pharmacy Care Plan       CARE PLAN ENTRY  Current Barriers:  . Chronic Disease Management support, education, and care coordination needs related to Hypertension, Hyperlipidemia, and Diabetes   Hypertension . Pharmacist Clinical Goal(s): o Over the next 45 days, patient will work with PharmD and providers to achieve BP goal <130/80 . Current regimen:  o Amlodipine 81m daily o Spironolactone 558mdaily . Interventions: o Discussed proper blood pressure monitoring technique (rest for at least 5 minutes, arm at heart level resting on a hard surface, after taking blood pressure medications, etc) o Reviewed recent home blood pressure readings o Will collaborate with PCP about adding valsartan 8055maily if BP remains elevated at follow up visit . Patient self care activities - Over the next 45 days, patient will: o Check BP once daily and if symptomatic, document, and provide at future appointments o Ensure daily salt intake < 2300 mg/day  Hyperlipidemia . Pharmacist Clinical Goal(s): o Over the next 45 days, patient will work with PharmD and providers to achieve LDL goal < 70 . Current regimen:  o Pravastatin 107m1m2 tablet daily . Interventions: o Provided dietary recommendations . Patient self care activities - Over the next 45 days, patient will: o Focus on a heart-healthy, balanced diet as discussed  Diabetes . Pharmacist Clinical Goal(s): o Over the next 45 days, patient will work with PharmD and providers to achieve A1c goal <7% . Current regimen:  o Ozempic 1mg 85mkly o Humalog 25 units before breakfast, 35 units before lunch, and 23 units before dinner o Farxiga 10mg 32m daily o Tresiba 16 units nightly . Interventions: o Dietary recommendations  provided - Advised patient to decrease carbohydrates with each meal to 45-60 grams (goal of 50 grams) - Advised patient not to skip meals, especially if taking insulin - Advised patient to make sure she is eating balanced meals (PLATE method, protein with each meal) - Provided Daily Diabetes Meal Planning Guide and My Carbohydrate Guide to patient o Reviewed recent blood glucose readings o Collaborate with PCP and recommend increasing Tresiba to 18 units daily PCP agreed to changes  o  Discussed how diet is affecting blood sugar (ie. Elevated blood sugar after having cereal with bananas, toast, and orange juice) o Advised patient it is fine to administer Tresiba 2 hours prior to bedtime . Patient self care activities - Over the next 45 days, patient will: o Check blood sugar continuously, but at least 3-4 times daily, document, and provide at future appointments o Contact provider with any episodes of hypoglycemia o Increase water intake by 1 glass a day (goal 64 ounces daily) o Increase to [place] daily before bedtime  o Decrease carbohydrates eaten at each meal (goal 45-60 grams)  o Not skip meals o Eat lean protein with every meal  Medication management . Pharmacist Clinical Goal(s): o Over the next 90 days, patient will work with PharmD and providers to maintain optimal medication adherence . Current pharmacy: [facility] . Interventions o Comprehensive medication review performed. o Utilize [facility] for medication synchronization, packaging and delivery . Patient self care activities - Over the next 90 days, patient will: o Focus on medication adherence by using adherence packaging o Take medications as prescribed o Report any questions or concerns to PharmD and/or provider(s)  Please see past updates related to this goal by clicking on the "Past Updates" button in the selected goal                           Diabetes   Recent Relevant Labs: Lab Results   Component Value Date/Time   HGBA1C 11.4 (H) 05/03/2020 02:55 PM   HGBA1C 5.9 (H) 12/31/2019 04:11 PM   MICROALBUR 80 12/31/2019 03:19 PM   MICROALBUR 6.9 (H) 09/07/2016 01:41 PM   MICROALBUR 7.2 (H) 08/30/2015 01:59 PM   Kidney Function Lab Results  Component Value Date/Time   CREATININE 1.24 (H) 05/03/2020 02:55 PM   CREATININE 1.21 (H) 02/19/2020 09:51 AM   GFR 51.07 (L) 05/17/2017 08:54 AM   GFRNONAA 45 (L) 05/03/2020 02:55 PM   GFRAA 52 (L) 05/03/2020 02:55 PM  Stage 3a CKD   Checking BG: Multiple times daily with continuous glucose monitor (FreeStyle Libre)  Recent FBG Readings: 113, 112, 172, 155 Recent pre-meal BG readings: 173, 271, 137, 189, 159  Recent 2hr PP BG readings:  137, 198, 213, 157, 159, 167, 215, 182, 162 Recent HS BG readings:   Recent Random BG readings:  Patient has failed these meds in past: Invokana, Levemir, Lantus, Victoza, metformin, Actos, Janumet Patient is currently controlled on the following medications:   Ozempic 77m weekly on Thursdays   Humalog Flexpen 23-35 units before meals (25U am, 35U lunch, 23U dinner)  Farxiga 145mdaily  Tresiba 16 units nightly   Gabapentin 30082mhree times daily  Last diabetic Foot exam: 07/22/19 Last diabetic Eye exam: 08/10/19   We discussed:   Reviewed BG readings and diet log since last office visit . Diet extensively o Oatmeal, toast, bacon; Rice Krispies cereal with bananas and milk, orange juice, toast; eggs, grits, toast applesauce - BG was elevated after cereal/bananas/orange juice meal. Advised her to limit the juice that she drinks and be mindful of sugar content in fruit o Salmon, broccoli, green beans; Boiled shrimp, mashed potatoes, baked beans, lemonade (sweetened with Splenda); Cabbage, turnip greens, lima beans,  . No low BG readings since increasing to [place] units . Pt asked if it was okay to take [place] before bedtime o Advised pt it was fine to administer Tresiba at  that time . Pt has not been able to upload [employer] [place] online account  . Collaborate with PCP regarding pt's recent BG readings and insulin dosing o Recommend increasing Tresiba to 18 units nightly. PCP agreed  Plan Continue current medications  Increase Tresiba to 18 units nightly  Hypertension   Office blood pressures are  BP Readings from Last 3 Encounters:  05/03/20 140/76  03/30/20 129/81  03/22/20 (!) 141/84   Patient has failed these meds in the past: Carvedilol, clonidine, metoprolol, valsartan/HCTZ, losartan/HCTZ Patient is currently uncontrolled on the following medications:   Amlodipine 24m36mily  Spironolactone 50mg47m  daily  Patient checks BP at home daily  Patient home BP readings are ranging: 175/82, 184/96, 183/93, 16780  We discussed:  Pt states she check BP around 10am  Discussed proper BP technique (resting, arm at heart level, after medications, etc)  Stressed importance of resting for at least 5 minutes prior to taking BP  Goal BP <130/80  Plan Continue current medications  Collaborate with PCP about adding valsartan 108m if BP remains elevated at follow up    Medication Management   Pt uses [facility] for all medications  Pt endorses 100% compliance  We discussed:   Importance of medication adherence  Plan Utilize [facility] for medication synchronization, packaging and delivery   Follow up: 4 day phone call   CJannette Fogo PharmD Clinical Pharmacist [facility] [phone]

## 2020-06-03 ENCOUNTER — Other Ambulatory Visit: Payer: Self-pay

## 2020-06-03 ENCOUNTER — Ambulatory Visit: Payer: Self-pay

## 2020-06-03 DIAGNOSIS — E1165 Type 2 diabetes mellitus with hyperglycemia: Secondary | ICD-10-CM

## 2020-06-03 DIAGNOSIS — I10 Essential (primary) hypertension: Secondary | ICD-10-CM

## 2020-06-03 NOTE — Chronic Care Management (AMB) (Signed)
Chronic Care Management Pharmacy  Name: BREE HEINZELMAN  MRN: [id number] DOB: Jan 21, 1951  Chief Complaint/ HPI  [name],  69 y.o. , female presents for their Follow-Up CCM visit with the clinical pharmacist via telephone due to COVID-19 Pandemic.   PCP : [name], FNP  Their chronic conditions include: Hypertension, Hyperlipidemia, Asthma, GERD, Diabetes with stage III CKD, Osteoarthritis  Office Visits: 05/03/20 OV: DM follow up. Pt is using FreeStyle Libre meter. HgbA1c up to 11.4%. B12 level within normal limits. Use sliding scale insulin for meal time blood sugars. Will likely start on long-acting insulin (Tresiba samples given, but advised to wait to start until directions given).   12/31/19 AWV and OV: Presented for Annual exam. Labs ordered (HgbA1c, Lipid panel, CMP, CBC, Vitamin D). BG has been more elevated recently. Started on Farxiga 74m daily. Fair control of BP, continue current medications. Continue to use pain cream for knee pain as needed; if worsens, will consider referral to orthopedics.   10/22/19 OV: Presented for follow up diabetic visit; labs ordered (lipid panel, CMP14+EGFR, HGB A1c, Vit B12, Vit D). Vitamin D and B12 low. Continue Vitamin D supplement and consider B12 injections. Dietary and exercise recommendations provided. Follow up in 3 months.  09/03/19 OV: Presented for follow up diabetic visit; Lunchtime Humalog increased due to elevated BG (30 units before breakfast, 35 units before lunch, 25 units before supper); constipation relieved with Amitiza.   07/22/19 OV: Presented for constipation, bloating and flatulence. Stool softeners and Metamucil have not relieved symptoms. Referred to GI for dysphagia, Labs ordered (lipid panel, CMP14+EGFR, HgbA1c), diabetic foot exam performed, continue checking blood sugar and injecting insulin three times daily w/ ozempic weekly. Start Amitiza for constipation. Follow up in 4-6 weeks. Influenza vaccine  administered.   Consult Visit: 03/30/20 Orthopedic surgery OV w/ Dr. YLorin Mercy Follow up post L4 hemilaminectomy on the right and removal of a cephalad migrated free fragment with radiculopathy. Can do straight leg raise (unable to do before surgery). Continue leg raises using purse as counterweight. Pt happy that leg pain is significantly better. Return in 3 months.   03/02/20 Orthopedic surgery OV w/ [name] and steri-strips applied. No drainage or signs of infection. Pt will gradually increase walking and avoid bending, twisting, and heavy lifting. Follow up w/ Dr. YLorin Mercyin 4 weeks.   02/19/20 Right L4-5 microdiscetomy hemilaminectomy and removal of free fragment.  02/17/20 Orthopedic surgery OV w/ Dr. YLorin Mercy Referred to surgery for herniated nucleus polposus. In significant pain during exam. Pt had already eaten at before OV. Surgery scheduled for Friday, 6/25, for removal of migrated fragment.    02/16/20 Ortho OV w/ Dr. HJunius Roads Presented for low back pain and right sided radiculopathy for the past 2 weeks. Complains of urinary incontinence since new onset of pain. Rx for Dilaudid short course[facility] YLorin Mercyfor possible surgical intervention.   02/13/20 ED visit: Presented for leg pain. Seen in ED 3 days ago for same issue. She fell on buttocks yesterday while using her walker (her leg gave out). Pain is worse with walking. Pt reports shooting pain, pressure, and weakness. MRI showed new L4-5 disc extrusion with right L4 nerve root impingement in lateral recess. 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 No acute fracture. Discharged with 6 days prednisone taper.    02/10/20 ED visit: Presented with right leg pain for past several  days. Pt denies trauma or injury. Pain starts in lower back and radiates to thigh, shin, and down her leg. Labs were unremarkable. Hip Xrays unremarkable. CT  showed multilevel disease worse on right L4-5. Administered multiple rounds of pain medicine, muscle relaxants, steroids, and NSAIDs. Pain improved, no MRI needed. Refer to spine surgeon outpatient. Prescribed short course of Norco and Baclofen at discharge. Follow up with PCP in 1-2 days.    10/18/19- ED visit: Presented for sudden onset left ear pain 4 days prior with clear, watery drainage; left eardrum found to be perforated upon exam; recommended naproxen or Tylenol for pain and follow up with ENT.   CCM Encounters: 01/15/20 RN: Reviewed and updated care plan and pt goals  09/16/19 PharmD: Comprehensive medication review, patient education. Pt reported issues receiving FreeStyle Libre.   08/07/19 PharmD: Pt recently seen by GI. Failed capsule swallow evaluation. GI to schedule esophageal dilation procedure. Encouraged pt to crush meds as directed due to difficulty swallowing.  07/31/19 PharmD: Working to obtain [employer] CGM. Pt approved for another 6 moths by [place].   07/29/19 PharmD:  Collaboration with [place] for patient's FreeStyle Libre testing supplies  07/27/19 PharmD: Patient requesting FreeStyle Libre assistance, denied at pharmacy. [facility] contacted.   07/22/19 RN: Patient education, medication review, coordination of diabetes testing supplies  07/17/19 RN: Compliance with GI and Urology treatment plans encourage, patient education.  Medications: Outpatient Encounter Medications as of 06/03/2020  Medication Sig  . amLODipine (NORVASC) 10 MG tablet TAKE ONE TABLET BY MOUTH EVERYDAY AT BEDTIME  . Continuous Blood Gluc Receiver (FREESTYLE LIBRE READER) DEVI 1 each by Does not apply route See admin instructions. CGM device-Freestyle Libre  . Continuous Blood Gluc Sensor (FREESTYLE LIBRE 14 DAY SENSOR) MISC 1 each by Does not apply route See admin instructions. CGM 14-day sensors; send refills to [facility] (in [place])   . dapagliflozin  propanediol (FARXIGA) 5 MG TABS tablet Take 2 tablets (10 mg total) by mouth daily.  [name] docusate sodium (COLACE) 100 MG capsule Take 100 mg by mouth daily as needed for mild constipation.   . gabapentin (NEURONTIN) 300 MG capsule TAKE 1 CAPSULE(300 MG) BY MOUTH THREE TIMES DAILY  . insulin degludec (TRESIBA FLEXTOUCH) 100 UNIT/ML FlexTouch Pen Inject 12 Units into the skin daily.  . insulin lispro (HUMALOG KWIKPEN) 100 UNIT/ML KwikPen Take 25 units [place] breakfast and dinner and 35 units at lunch  . Insulin Pen Needle (BD PEN NEEDLE MICRO U/F) 32G X 6 MM MISC DX:E11.65 USE TO CHECK BLOOD SUGARS THREE TIMES A DAY  . Lancets Misc. MISC 1 each by Does not apply route 4 (four) times daily -  with meals and at bedtime.  [name] lubiprostone (AMITIZA) 8 MCG capsule Take 1 capsule (8 mcg total) by mouth daily with breakfast.  . mirabegron ER (MYRBETRIQ) 50 MG TB24 tablet Take 1 tablet (50 mg total) by mouth daily.  . mometasone-formoterol (DULERA) 100-5 MCG/ACT AERO Inhale 2 puffs into the lungs as needed for wheezing.  (Patient not taking: Reported on 05/03/2020)  . Multiple Vitamin (MULTIVITAMIN WITH MINERALS) TABS tablet Take 1 tablet by mouth daily.  . ondansetron (ZOFRAN) 4 MG tablet Take 1 tablet (4 mg total) by mouth every 6 (six) hours as needed for nausea or vomiting.  . pantoprazole (PROTONIX) 40 MG tablet Take 30- 60 min before your first and last meals of the day  . pravastatin (PRAVACHOL) 80 MG tablet Take 0.5 tablets (40 mg total) by mouth  daily.  . Semaglutide, 1 MG/DOSE, (OZEMPIC, 1 MG/DOSE,) 2 MG/1.5ML SOPN INJECT 1MG INTO HE SKIN EVERY Thursday  . spironolactone (ALDACTONE) 50 MG tablet TAKE ONE TABLET BY MOUTH EVERY MORNING  . Vitamin D, Ergocalciferol, (DRISDOL) 1.25 MG (50000 UNIT) CAPS capsule Take 1 capsule (50,000 Units total) by mouth every 7 (seven) days. TAKE 1 CAPSULE BY MOUTH EVERY 7 DAYS ON WEDNESDAYS   No facility-administered encounter medications on file as of 06/03/2020.   Current  Diagnosis/Assessment:    Goals Addressed   None                       Diabetes   Recent Relevant Labs: Lab Results  Component Value Date/Time   HGBA1C 11.4 (H) 05/03/2020 02:55 PM   HGBA1C 5.9 (H) 12/31/2019 04:11 PM   MICROALBUR 80 12/31/2019 03:19 PM   MICROALBUR 6.9 (H) 09/07/2016 01:41 PM   MICROALBUR 7.2 (H) 08/30/2015 01:59 PM   Kidney Function Lab Results  Component Value Date/Time   CREATININE 1.24 (H) 05/03/2020 02:55 PM   CREATININE 1.21 (H) 02/19/2020 09:51 AM   GFR 51.07 (L) 05/17/2017 08:54 AM   GFRNONAA 45 (L) 05/03/2020 02:55 PM   GFRAA 52 (L) 05/03/2020 02:55 PM  Stage 3a CKD   Checking BG: Multiple times daily with continuous glucose monitor (FreeStyle Libre)  Recent FBG Readings: 160, 147, 184 Recent pre-meal BG readings: 200, 182, 158, 171 Recent 2hr PP BG readings:  172, 212, 194, 161, 167, 183 Recent HS BG readings:   Recent Random BG readings:  Patient has failed these meds in past: Invokana, Levemir, Lantus, Victoza, metformin, Actos, Janumet Patient is currently controlled on the following medications:   Ozempic 48m weekly on Thursdays   Humalog Flexpen 23-35 units before meals (25U am, 35U lunch, 23U dinner)  Farxiga 181mdaily  Tresiba 18 units nightly   Gabapentin 30037mhree times daily  Last diabetic Foot exam: 07/22/19 Last diabetic Eye exam: 08/10/19   We discussed:   Diet/insulin log since [place] to [place] nightly  Wednesday 10/6: 40g carbs with breakfast/ 25 units Humalog, 60g carbs with lunch/ 35 units Humalog, 55g carbs with dinner/ 23 units Humalog; Tresiba 18 units   Thursday 10/7: 40g carbs with breakfast/ 25 units Humalog, 55g carbs with lunch/ 35 units Humalog, 50g carbs with dinner/ 23 units Humalog; Tresiba 18 units  Friday 10/8: 45g carbs with breakfast/ 25 units Humalog   Pt states that she feels alright; she does not have as many sluggish days as she used to  Discussed the difference between  Ozempic, Tresiba, and Humalog  All injectable, but has different durations and mechanism of action  Pt states her family members have told her she's on too many diabetic medications  Advised pt that every pt is different and some need more than one medication to control blood sugar  Discussed that patient does not feel well at BG in the low 100s  Advised pt the her body will adjust to the lower BGs  FBG goal 80-130  2 HR PP goal <180  Pt has increased water intake   Continue current insulin dosing  Plan Continue current medications  Follow up in 1 week to review BG and adjust insulin as necessary   Hypertension   Office blood pressures are  BP Readings from Last 3 Encounters:  05/03/20 140/76  03/30/20 129/81  03/22/20 (!) 141/84   Patient has failed these meds in the past: Carvedilol, clonidine, metoprolol,  valsartan/HCTZ, losartan/HCTZ Patient is currently uncontrolled on the following medications:   Amlodipine 52m daily  Spironolactone 574mdaily  Patient checks BP at home daily  Patient home BP readings are ranging: 155/72, 167/80, 180/92  We discussed:  Pt mentions some stress as a reason for elevated BP  Discussed resting prior to checking BP  Pt states she has been doing this  Goal BP <130/80  Plan Continue current medications  Collaborate with PCP about adding valsartan 8087mMedication Management   Pt uses [facility] for all medications  Pt endorses 100% compliance  We discussed:   Importance of medication adherence  Plan Utilize [facility] pharmacy for medication synchronization, packaging and delivery   Follow up: 1 week phone call   CouJannette FogoharmD Clinical Pharmacist [facility] [phone]

## 2020-06-03 NOTE — Patient Instructions (Addendum)
Visit Information  Goals Addressed            This Visit's Progress   . Pharmacy Care Plan       CARE PLAN ENTRY  Current Barriers:  . Chronic Disease Management support, education, and care coordination needs related to Hypertension, Hyperlipidemia, and Diabetes   Hypertension . Pharmacist Clinical Goal(s): o Over the next 45 days, patient will work with PharmD and providers to achieve BP goal <130/80 . Current regimen:  o Amlodipine 10mg  daily o Spironolactone 50mg  daily . Interventions: o Discussed proper blood pressure monitoring technique (rest for at least 5 minutes, arm at heart level resting on a hard surface, after taking blood pressure medications, etc) o Reviewed recent home blood pressure readings o Will collaborate with PCP about adding valsartan 80mg  daily . Patient self care activities - Over the next 45 days, patient will: o Check BP once daily and if symptomatic, document, and provide at future appointments o Ensure daily salt intake < 2300 mg/day  Hyperlipidemia . Pharmacist Clinical Goal(s): o Over the next 45 days, patient will work with PharmD and providers to achieve LDL goal < 70 . Current regimen:  o Pravastatin 80mg  1/2 tablet daily . Interventions: o Provided dietary recommendations . Patient self care activities - Over the next 45 days, patient will: o Focus on a heart-healthy, balanced diet as discussed  Diabetes . Pharmacist Clinical Goal(s): o Over the next 45 days, patient will work with PharmD and providers to achieve A1c goal <7% . Current regimen:  o Ozempic 1mg  weekly o Humalog 25 units before breakfast, 35 units before lunch, and 23 units before dinner o Farxiga 10mg  once daily o Tresiba 18 units nightly . Interventions: o Dietary recommendations provided - Advised patient to decrease carbohydrates with each meal to 45-60 grams (goal of 50 grams) - Advised patient not to skip meals, especially if taking insulin - Advised patient to  make sure she is eating balanced meals (PLATE method, protein with each meal) - Provided Daily Diabetes Meal Planning Guide and My Carbohydrate Guide to patient o Reviewed recent blood glucose readings o Discussed appropriate goals for fasting blood sugar (80-130) and 2 hours after eating (less than 180) o Discussed the differences between Ozempic, Tresiba, and Humalog - Each medication serves a different purpose (has a different mechanism of action) and works for a different amount of time . Patient self care activities - Over the next 45 days, patient will: o Check blood sugar continuously, but at least 3-4 times daily, document, and provide at future appointments o Contact provider with any episodes of hypoglycemia o Increase water intake by 1 glass a day (goal 64 ounces daily) o Continue Tresiba 18 units daily before bedtime  o Decrease carbohydrates eaten at each meal (goal 45-60 grams) o Not skip meals o Eat lean protein with every meal  Medication management . Pharmacist Clinical Goal(s): o Over the next 90 days, patient will work with PharmD and providers to maintain optimal medication adherence . Current pharmacy: [facility] . Interventions o Comprehensive medication review performed. o Utilize [facility] pharmacy for medication synchronization, packaging and delivery . Patient self care activities - Over the next 90 days, patient will: o Focus on medication adherence by using adherence packaging o Take medications as prescribed o Report any questions or concerns to PharmD and/or provider(s)  Please see past updates related to this goal by clicking on the "Past Updates" button in the selected goal  The patient verbalized understanding of instructions provided today and agreed to receive a mailed copy of patient instruction and/or educational materials.  Telephone follow up appointment with pharmacy team member scheduled for: 06/10/20 @ 2:00 PM  Jannette Fogo, PharmD Clinical Pharmacist [facility] [phone]   Carbohydrate Counting for Diabetes Mellitus, Adult  Carbohydrate counting is a method of keeping track of how many carbohydrates you eat. Eating carbohydrates naturally increases the amount of sugar (glucose) in the blood. Counting how many carbohydrates you eat helps keep your blood glucose within normal limits, which helps you manage your diabetes (diabetes mellitus). It is important to know how many carbohydrates you can safely have in each meal. This is different for every person. A diet and nutrition specialist (registered dietitian) can help you make a meal plan and calculate how many carbohydrates you should have at each meal and snack. Carbohydrates are found in the following foods:  Grains, such as breads and cereals.  Dried beans and soy products.  Starchy vegetables, such as potatoes, peas, and corn.  Fruit and fruit juices.  Milk and yogurt.  Sweets and snack foods, such as cake, cookies, candy, chips, and soft drinks. How do I count carbohydrates? There are two ways to count carbohydrates in food. You can use either of the methods or a combination of both. Reading "Nutrition Facts" on packaged food The "Nutrition Facts" list is included on the labels of almost all packaged foods and beverages in the [place] It includes:  The serving size.  Information about nutrients in each serving, including the grams (g) of carbohydrate per serving. To use the "Nutrition Facts":  Decide how many servings you will have.  Multiply the number of servings by the number of carbohydrates per serving.  The resulting number is the total amount of carbohydrates that you will be having. Learning standard serving sizes of other foods When you eat carbohydrate foods that are not packaged or do not include "Nutrition Facts" on the label, you need to measure the servings in order to count the amount of  carbohydrates:  Measure the foods that you will eat with a food scale or measuring cup, if needed.  Decide how many standard-size servings you will eat.  Multiply the number of servings by 15. Most carbohydrate-rich foods have about 15 g of carbohydrates per serving. ? For example, if you eat 8 oz (170 g) of strawberries, you will have eaten 2 servings and 30 g of carbohydrates (2 servings x 15 g = 30 g).  For foods that have more than one food mixed, such as soups and casseroles, you must count the carbohydrates in each food that is included. The following list contains standard serving sizes of common carbohydrate-rich foods. Each of these servings has about 15 g of carbohydrates:   hamburger bun or  English muffin.   oz (15 mL) syrup.   oz (14 g) jelly.  1 slice of bread.  1 six-inch tortilla.  3 oz (85 g) cooked rice or pasta.  4 oz (113 g) cooked dried beans.  4 oz (113 g) starchy vegetable, such as peas, corn, or potatoes.  4 oz (113 g) hot cereal.  4 oz (113 g) mashed potatoes or  of a large baked potato.  4 oz (113 g) canned or frozen fruit.  4 oz (120 mL) fruit juice.  4-6 crackers.  6 chicken nuggets.  6 oz (170 g) unsweetened dry cereal.  6 oz (170 g) plain fat-free yogurt  or yogurt sweetened with artificial sweeteners.  8 oz (240 mL) milk.  8 oz (170 g) fresh fruit or one small piece of fruit.  24 oz (680 g) popped popcorn. Example of carbohydrate counting Sample meal  3 oz (85 g) chicken breast.  6 oz (170 g) brown rice.  4 oz (113 g) corn.  8 oz (240 mL) milk.  8 oz (170 g) strawberries with sugar-free whipped topping. Carbohydrate calculation 1. Identify the foods that contain carbohydrates: ? Rice. ? Corn. ? Milk. ? Strawberries. 2. Calculate how many servings you have of each food: ? 2 servings rice. ? 1 serving corn. ? 1 serving milk. ? 1 serving strawberries. 3. Multiply each number of servings by 15 g: ? 2 servings rice  x 15 g = 30 g. ? 1 serving corn x 15 g = 15 g. ? 1 serving milk x 15 g = 15 g. ? 1 serving strawberries x 15 g = 15 g. 4. Add together all of the amounts to find the total grams of carbohydrates eaten: ? 30 g + 15 g + 15 g + 15 g = 75 g of carbohydrates total. Summary  Carbohydrate counting is a method of keeping track of how many carbohydrates you eat.  Eating carbohydrates naturally increases the amount of sugar (glucose) in the blood.  Counting how many carbohydrates you eat helps keep your blood glucose within normal limits, which helps you manage your diabetes.  A diet and nutrition specialist (registered dietitian) can help you make a meal plan and calculate how many carbohydrates you should have at each meal and snack. This information is not intended to replace advice given to you by your health care provider. Make sure you discuss any questions you have with your health care provider. Document Revised: 03/07/2017 Document Reviewed: 01/25/2016 Elsevier Patient Education  [place].

## 2020-06-06 DIAGNOSIS — Z794 Long term (current) use of insulin: Secondary | ICD-10-CM | POA: Diagnosis not present

## 2020-06-06 DIAGNOSIS — E118 Type 2 diabetes mellitus with unspecified complications: Secondary | ICD-10-CM | POA: Diagnosis not present

## 2020-06-07 ENCOUNTER — Telehealth: Payer: Self-pay | Admitting: Pharmacist

## 2020-06-07 ENCOUNTER — Other Ambulatory Visit: Payer: Self-pay

## 2020-06-07 MED ORDER — TRESIBA FLEXTOUCH 100 UNIT/ML ~~LOC~~ SOPN: 18.0000 [IU] | PEN_INJECTOR | Freq: Every day | SUBCUTANEOUS | 1 refills | Status: DC

## 2020-06-07 NOTE — Chronic Care Management (AMB) (Signed)
Chronic Care Management Pharmacy Assistant   Name: KALYSTA KNEISLEY  MRN: [id number] DOB: [birth year]  Reason for Encounter: Medication Review/ Monthly Dispensing Call  Patient Questions:  1.  Have you seen any other providers since your last visit? Yes, 05/30/20 & 06/03/20- Jannette Fogo, CPP (CCM Pharmacist)  2.  Any changes in your medicines or health? Yes, [name] increased to 18 units daily  PCP : [name], MD  Allergies:   Allergies  Allergen Reactions  . Pollen Extract Other (See Comments)    Congestion/sneezing  . Tomato Hives and Itching  . Codeine Hives, Itching and Nausea And Vomiting    Medications: Outpatient Encounter Medications as of 06/07/2020  Medication Sig  . amLODipine (NORVASC) 10 MG tablet TAKE ONE TABLET BY MOUTH EVERYDAY AT BEDTIME  . Continuous Blood Gluc Receiver (FREESTYLE LIBRE READER) DEVI 1 each by Does not apply route See admin instructions. CGM device-Freestyle Libre  . Continuous Blood Gluc Sensor (FREESTYLE LIBRE 14 DAY SENSOR) MISC 1 each by Does not apply route See admin instructions. CGM 14-day sensors; send refills to [facility] (in [place])   . dapagliflozin propanediol (FARXIGA) 5 MG TABS tablet Take 2 tablets (10 mg total) by mouth daily.  [name] docusate sodium (COLACE) 100 MG capsule Take 100 mg by mouth daily as needed for mild constipation.   . gabapentin (NEURONTIN) 300 MG capsule TAKE 1 CAPSULE(300 MG) BY MOUTH THREE TIMES DAILY  . insulin degludec (TRESIBA FLEXTOUCH) 100 UNIT/ML FlexTouch Pen Inject 12 Units into the skin daily.  . insulin lispro (HUMALOG KWIKPEN) 100 UNIT/ML KwikPen Take 25 units [place] breakfast and dinner and 35 units at lunch  . Insulin Pen Needle (BD PEN NEEDLE MICRO U/F) 32G X 6 MM MISC DX:E11.65 USE TO CHECK BLOOD SUGARS THREE TIMES A DAY  . Lancets Misc. MISC 1 each by Does not apply route 4 (four) times daily -  with meals and at bedtime.  [name] lubiprostone (AMITIZA) 8 MCG capsule Take 1 capsule (8 mcg  total) by mouth daily with breakfast.  . mirabegron ER (MYRBETRIQ) 50 MG TB24 tablet Take 1 tablet (50 mg total) by mouth daily.  . mometasone-formoterol (DULERA) 100-5 MCG/ACT AERO Inhale 2 puffs into the lungs as needed for wheezing.  (Patient not taking: Reported on 05/03/2020)  . Multiple Vitamin (MULTIVITAMIN WITH MINERALS) TABS tablet Take 1 tablet by mouth daily.  . ondansetron (ZOFRAN) 4 MG tablet Take 1 tablet (4 mg total) by mouth every 6 (six) hours as needed for nausea or vomiting.  . pantoprazole (PROTONIX) 40 MG tablet Take 30- 60 min before your first and last meals of the day  . pravastatin (PRAVACHOL) 80 MG tablet Take 0.5 tablets (40 mg total) by mouth daily.  . Semaglutide, 1 MG/DOSE, (OZEMPIC, 1 MG/DOSE,) 2 MG/1.5ML SOPN INJECT 1MG  INTO HE SKIN EVERY Thursday  . spironolactone (ALDACTONE) 50 MG tablet TAKE ONE TABLET BY MOUTH EVERY MORNING  . Vitamin D, Ergocalciferol, (DRISDOL) 1.25 MG (50000 UNIT) CAPS capsule Take 1 capsule (50,000 Units total) by mouth every 7 (seven) days. TAKE 1 CAPSULE BY MOUTH EVERY 7 DAYS ON WEDNESDAYS   No facility-administered encounter medications on file as of 06/07/2020.    Current Diagnosis: Patient Active Problem List   Diagnosis Date Noted  . S/P lumbar laminectomy 03/30/2020  . Localized swelling, mass and lump, multiple sites 03/27/2018  . Type II diabetes mellitus with renal manifestations ([place]) 12/27/2017  . HLD (hyperlipidemia) 12/27/2017  . HTN (hypertension) 12/27/2017  .  CKD (chronic kidney disease), stage III ([place]) 12/27/2017  . Cough variant asthma 12/01/2015  . Osteoarthritis of left knee, primary localized 08/17/2014  . Knee osteoarthritis 08/17/2014  . Hypoglycemia 11/15/2013  . Other and unspecified hyperlipidemia 08/06/2013  . Type II diabetes mellitus, uncontrolled ([place]) 07/20/2013  . Varicose veins of lower extremities with other complications 96/78/9381  . Fibromyalgia   . GERD 05/18/2010  . ABDOMINAL PAIN,  GENERALIZED 05/02/2010  . OBESITY 04/20/2010  . BURSITIS, LEFT SHOULDER 03/09/2010  . Lipoma of arm s/p excision 01/29/2014 01/10/2010  . DEPRESSION 12/27/2009  . BACK PAIN WITH RADICULOPATHY 12/27/2009  . CYSTITIS, ACUTE 10/21/2009  . MICROSCOPIC HEMATURIA 10/18/2009  . KNEE PAIN, BILATERAL 10/18/2009  . NEUROPATHY 09/08/2009  . Asthma 09/08/2009  . STRESS INCONTINENCE 09/08/2009  . CHEST PAIN 07/19/2009  . ESOPHAGEAL STRICTURE 08/27/2005  . Diabetes mellitus without complication ([place]) 01/75/1025    Goals Addressed   None    Reviewed chart for medication changes ahead of medication coordination call.  No OVs, Consults, or hospital visits since last care coordination Pharmacist visit. Medication changes indicated: from Pharmacist visit Tresiba increased to 18 units daily.  BP Readings from Last 3 Encounters:  05/03/20 140/76  03/30/20 129/81  03/22/20 (!) 141/84    Lab Results  Component Value Date   HGBA1C 11.4 (H) 05/03/2020     Patient obtains medications through Adherence Packaging  90 Days   Last adherence delivery included: Novofine needles- use three times a day as directed, Women's 50 plus MVI- 1 tablet daily, Myrbetriq 50 mg- 1 tablet daily, Spironolactone 50 mg- 1 tablet daily, Humalog Kwikpen- inject 25 units sq at break and dinner and 35 units at lunch, Amlodipine 10 mg- 1 tablet at bedtime, Ozempic 1 mg- Inject 1 mg sq weekly, Amitiza 8 mcg- 1 capsule daily, Farxiga 10 mg, Vitamin D2 1,250mg - 1 tablet once weekly.  Patient declined Gabapentin 300 mg- 1 tablet three times a day due to having an adequate supply on hand from prescription received from [facility], patient states she has enough to last until the end of Nov.  Patient is due for next adherence delivery on: 06/10/2020. Called patient and reviewed medications and coordinated delivery.  This delivery to include: Novofine 32gx82mm- use three times a day as directed, Ozempic 1 mg- Inject 1 mg sq weekly and  Tresiba Flextouch- Inject 18 units sq daily.  Patient declined the following medications:  Women's 50 plus MVI- 1 tablet daily, Myrbetriq 50 mg- 1 tablet daily, Spironolactone 50 mg- 1 tablet daily, Humalog Kwikpen- inject 25 units sq at break and dinner and 35 units at lunch, Amlodipine 10 mg- 1 tablet at bedtime, Amitiza 8 mcg- 1 capsule daily, Farxiga 10 mg, Vitamin D2 1,250mg - 1 tablet once weekly due to receiving a 90 day supply on 05/12/2020.  Patient needs refills for [name] Flextouch- sample given from PCP office.  Confirmed delivery date of 06/10/2020,  advised patient that pharmacy will contact them the morning of delivery.  Follow-Up:  Coordination of Enhanced Pharmacy Services and Pharmacist Review  Patient was given sample of Tresiba to help with blood sugars, she has been increased to 18 units a day since trial. Blood sugars are doing much better, patient will need a new prescription sent to [facility]. Leata Mouse, CPP notified.  [name], [place] Pharmacist Assistant [phone]

## 2020-06-10 ENCOUNTER — Ambulatory Visit: Payer: Self-pay | Admitting: Pharmacist

## 2020-06-10 ENCOUNTER — Telehealth: Payer: Self-pay | Admitting: Pharmacist

## 2020-06-10 DIAGNOSIS — I10 Essential (primary) hypertension: Secondary | ICD-10-CM

## 2020-06-10 DIAGNOSIS — E1165 Type 2 diabetes mellitus with hyperglycemia: Secondary | ICD-10-CM

## 2020-06-10 NOTE — Chronic Care Management (AMB) (Signed)
Chronic Care Management Pharmacy  Name: [name]  MRN: [id number] DOB: Jan 21, 1951  Chief Complaint/ HPI  [name],  69 y.o. , female presents for their Follow-Up CCM visit with the clinical pharmacist via telephone due to COVID-19 Pandemic.   PCP : [name], FNP  Their chronic conditions include: Hypertension, Hyperlipidemia, Asthma, GERD, Diabetes with stage III CKD, Osteoarthritis  Office Visits: 05/03/20 OV: DM follow up. Pt is using FreeStyle Libre meter. HgbA1c up to 11.4%. B12 level within normal limits. Use sliding scale insulin for meal time blood sugars. Will likely start on long-acting insulin (Tresiba samples given, but advised to wait to start until directions given).   12/31/19 AWV and OV: Presented for Annual exam. Labs ordered (HgbA1c, Lipid panel, CMP, CBC, Vitamin D). BG has been more elevated recently. Started on Farxiga 74m daily. Fair control of BP, continue current medications. Continue to use pain cream for knee pain as needed; if worsens, will consider referral to orthopedics.   10/22/19 OV: Presented for follow up diabetic visit; labs ordered (lipid panel, CMP14+EGFR, HGB A1c, Vit B12, Vit D). Vitamin D and B12 low. Continue Vitamin D supplement and consider B12 injections. Dietary and exercise recommendations provided. Follow up in 3 months.  09/03/19 OV: Presented for follow up diabetic visit; Lunchtime Humalog increased due to elevated BG (30 units before breakfast, 35 units before lunch, 25 units before supper); constipation relieved with Amitiza.   07/22/19 OV: Presented for constipation, bloating and flatulence. Stool softeners and Metamucil have not relieved symptoms. Referred to GI for dysphagia, Labs ordered (lipid panel, CMP14+EGFR, HgbA1c), diabetic foot exam performed, continue checking blood sugar and injecting insulin three times daily w/ ozempic weekly. Start Amitiza for constipation. Follow up in 4-6 weeks. Influenza vaccine  administered.   Consult Visit: 03/30/20 Orthopedic surgery OV w/ Dr. YLorin Mercy Follow up post L4 hemilaminectomy on the right and removal of a cephalad migrated free fragment with radiculopathy. Can do straight leg raise (unable to do before surgery). Continue leg raises using purse as counterweight. Pt happy that leg pain is significantly better. Return in 3 months.   03/02/20 Orthopedic surgery OV w/ [name] and steri-strips applied. No drainage or signs of infection. Pt will gradually increase walking and avoid bending, twisting, and heavy lifting. Follow up w/ Dr. YLorin Mercyin 4 weeks.   02/19/20 Right L4-5 microdiscetomy hemilaminectomy and removal of free fragment.  02/17/20 Orthopedic surgery OV w/ Dr. YLorin Mercy Referred to surgery for herniated nucleus polposus. In significant pain during exam. Pt had already eaten at before OV. Surgery scheduled for Friday, 6/25, for removal of migrated fragment.    02/16/20 Ortho OV w/ Dr. HJunius Roads Presented for low back pain and right sided radiculopathy for the past 2 weeks. Complains of urinary incontinence since new onset of pain. Rx for Dilaudid short course[facility]. Y[facility]orin Mercyfor possible surgical intervention.   02/13/20 ED visit: Presented for leg pain. Seen in ED 3 days ago for same issue. She fell on buttocks yesterday while using her walker (her leg gave out). Pain is worse with walking. Pt reports shooting pain, pressure, and weakness. MRI showed new L4-5 disc extrusion with right L4 nerve root impingement in lateral recess. 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 No acute fracture. Discharged with 6 days prednisone taper.    02/10/20 ED visit: Presented with right leg pain for past several  days. Pt denies trauma or injury. Pain starts in lower back and radiates to thigh, shin, and down her leg. Labs were unremarkable. Hip Xrays unremarkable. CT  showed multilevel disease worse on right L4-5. Administered multiple rounds of pain medicine, muscle relaxants, steroids, and NSAIDs. Pain improved, no MRI needed. Refer to spine surgeon outpatient. Prescribed short course of Norco and Baclofen at discharge. Follow up with PCP in 1-2 days.    10/18/19- ED visit: Presented for sudden onset left ear pain 4 days prior with clear, watery drainage; left eardrum found to be perforated upon exam; recommended naproxen or Tylenol for pain and follow up with ENT.   CCM Encounters: 01/15/20 RN: Reviewed and updated care plan and pt goals  09/16/19 PharmD: Comprehensive medication review, patient education. Pt reported issues receiving FreeStyle Libre.   08/07/19 PharmD: Pt recently seen by GI. Failed capsule swallow evaluation. GI to schedule esophageal dilation procedure. Encouraged pt to crush meds as directed due to difficulty swallowing.  07/31/19 PharmD: Working to obtain [employer] CGM. Pt approved for another 6 moths by [place].   07/29/19 PharmD:  Collaboration with [place] for patient's FreeStyle Libre testing supplies  07/27/19 PharmD: Patient requesting FreeStyle Libre assistance, denied at pharmacy. [place] contacted.   07/22/19 RN: Patient education, medication review, coordination of diabetes testing supplies  07/17/19 RN: Compliance with GI and Urology treatment plans encourage, patient education.  Medications: Outpatient Encounter Medications as of 06/10/2020  Medication Sig  . amLODipine (NORVASC) 10 MG tablet TAKE ONE TABLET BY MOUTH EVERYDAY AT BEDTIME  . Continuous Blood Gluc Receiver (FREESTYLE LIBRE READER) DEVI 1 each by Does not apply route See admin instructions. CGM device-Freestyle Libre  . Continuous Blood Gluc Sensor (FREESTYLE LIBRE 14 DAY SENSOR) MISC 1 each by Does not apply route See admin instructions. CGM 14-day sensors; send refills to [facility] (in [place])   . dapagliflozin  propanediol (FARXIGA) 5 MG TABS tablet Take 2 tablets (10 mg total) by mouth daily.  [name] docusate sodium (COLACE) 100 MG capsule Take 100 mg by mouth daily as needed for mild constipation.   . gabapentin (NEURONTIN) 300 MG capsule TAKE 1 CAPSULE(300 MG) BY MOUTH THREE TIMES DAILY  . insulin degludec (TRESIBA FLEXTOUCH) 100 UNIT/ML FlexTouch Pen Inject 18 Units into the skin daily.  . insulin lispro (HUMALOG KWIKPEN) 100 UNIT/ML KwikPen Take 25 units [place] breakfast and dinner and 35 units at lunch  . Insulin Pen Needle (BD PEN NEEDLE MICRO U/F) 32G X 6 MM MISC DX:E11.65 USE TO CHECK BLOOD SUGARS THREE TIMES A DAY  . Lancets Misc. MISC 1 each by Does not apply route 4 (four) times daily -  with meals and at bedtime.  [name] lubiprostone (AMITIZA) 8 MCG capsule Take 1 capsule (8 mcg total) by mouth daily with breakfast.  . mirabegron ER (MYRBETRIQ) 50 MG TB24 tablet Take 1 tablet (50 mg total) by mouth daily.  . mometasone-formoterol (DULERA) 100-5 MCG/ACT AERO Inhale 2 puffs into the lungs as needed for wheezing.  (Patient not taking: Reported on 05/03/2020)  . Multiple Vitamin (MULTIVITAMIN WITH MINERALS) TABS tablet Take 1 tablet by mouth daily.  . ondansetron (ZOFRAN) 4 MG tablet Take 1 tablet (4 mg total) by mouth every 6 (six) hours as needed for nausea or vomiting.  . pantoprazole (PROTONIX) 40 MG tablet Take 30- 60 min before your first and last meals of the day  . pravastatin (PRAVACHOL) 80 MG tablet Take 0.5 tablets (40 mg total) by mouth  daily.  . Semaglutide, 1 MG/DOSE, (OZEMPIC, 1 MG/DOSE,) 2 MG/1.5ML SOPN INJECT 1MG INTO HE SKIN EVERY Thursday  . spironolactone (ALDACTONE) 50 MG tablet TAKE ONE TABLET BY MOUTH EVERY MORNING  . Vitamin D, Ergocalciferol, (DRISDOL) 1.25 MG (50000 UNIT) CAPS capsule Take 1 capsule (50,000 Units total) by mouth every 7 (seven) days. TAKE 1 CAPSULE BY MOUTH EVERY 7 DAYS ON WEDNESDAYS   No facility-administered encounter medications on file as of 06/10/2020.   Current  Diagnosis/Assessment:    Goals Addressed            This Visit's Progress   . Pharmacy Care Plan       CARE PLAN ENTRY  Current Barriers:  . Chronic Disease Management support, education, and care coordination needs related to Hypertension, Hyperlipidemia, and Diabetes   Hypertension . Pharmacist Clinical Goal(s): o Over the next 45 days, patient will work with PharmD and providers to achieve BP goal <130/80 . Current regimen:  o Amlodipine 40m daily o Spironolactone 536mdaily . Interventions: o Discussed proper blood pressure monitoring technique (rest for at least 5 minutes, arm at heart level resting on a hard surface, after taking blood pressure medications, etc) o Reviewed recent home blood pressure readings o Will collaborate with PCP about adding valsartan 8059maily . Patient self care activities - Over the next 45 days, patient will: o Check BP once daily and if symptomatic, document, and provide at future appointments o Ensure daily salt intake < 2300 mg/day  Hyperlipidemia . Pharmacist Clinical Goal(s): o Over the next 45 days, patient will work with PharmD and providers to achieve LDL goal < 70 . Current regimen:  o Pravastatin 4m72m2 tablet daily . Interventions: o Provided dietary recommendations . Patient self care activities - Over the next 45 days, patient will: o Focus on a heart-healthy, balanced diet as discussed  Diabetes . Pharmacist Clinical Goal(s): o Over the next 30 days, patient will work with PharmD and providers to achieve A1c goal <7% . Current regimen:  o Ozempic 1mg 54mkly o Humalog 25 units before breakfast, 35 units before lunch, and 23 units before dinner o Farxiga 10mg 65m daily o Tresiba 18 units nightly . Interventions: o Dietary recommendations provided - Advised her to not get discouraged and continue smart dietary choices, just because she doesn't necessarily feel a difference does not mean she is not making a difference  in her health. o Reviewed recent blood glucose readings o Discussed appropriate goals for fasting blood sugar (80-130) and 2 hours after eating (less than 180) . Patient self care activities - Over the next 30 days, patient will: o Check blood sugar continuously, but at least 3-4 times daily, document, and provide at future appointments o Contact provider with any episodes of hypoglycemia o Continue diet and exercise as current o Continue to take medications as directed  Medication management . Pharmacist Clinical Goal(s): o Over the next 90 days, patient will work with PharmD and providers to maintain optimal medication adherence . Current pharmacy: [facility] . Interventions o Comprehensive medication review performed. o Utilize [facility] pharmacy for medication synchronization, packaging and delivery . Patient self care activities - Over the next 90 days, patient will: o Focus on medication adherence by using adherence packaging o Take medications as prescribed o Report any questions or concerns to PharmD and/or provider(s)  Please see past updates related to this goal by clicking on the "Past Updates" button in the selected goal  Diabetes   Recent Relevant Labs: Lab Results  Component Value Date/Time   HGBA1C 11.4 (H) 05/03/2020 02:55 PM   HGBA1C 5.9 (H) 12/31/2019 04:11 PM   MICROALBUR 80 12/31/2019 03:19 PM   MICROALBUR 6.9 (H) 09/07/2016 01:41 PM   MICROALBUR 7.2 (H) 08/30/2015 01:59 PM   Kidney Function Lab Results  Component Value Date/Time   CREATININE 1.24 (H) 05/03/2020 02:55 PM   CREATININE 1.21 (H) 02/19/2020 09:51 AM   GFR 51.07 (L) 05/17/2017 08:54 AM   GFRNONAA 45 (L) 05/03/2020 02:55 PM   GFRAA 52 (L) 05/03/2020 02:55 PM  Stage 3a CKD   Checking BG: Multiple times daily with continuous glucose monitor (FreeStyle Libre)  Recent FBG Readings:  151, 147, 187, 167 Recent pre-meal BG readings: 167, 168, 161, 171 Recent 2hr  PP BG readings: 181, 191, 177, 183 Recent HS BG readings:   Recent Random BG readings:  Patient has failed these meds in past: Invokana, Levemir, Lantus, Victoza, metformin, Actos, Janumet Patient is currently controlled on the following medications:   Ozempic 48m weekly on Thursdays   Humalog Flexpen 23-35 units before meals (25U am, 35U lunch, 23U dinner)  Farxiga 142mdaily  Tresiba 18 units nightly   Gabapentin 30034mhree times daily  Last diabetic Foot exam: 07/22/19 Last diabetic Eye exam: 08/10/19   We discussed:   Diet  06/07/2020- lunch - tuna sandwich on wheat bread, broccoli soup,salad / dinner baked chicken , mac and cheese,turnip greens/ dinner roll. Tea with splenda  06/08/2020 - Breakfast the same as 06/07/2020/ lunch- Cheeseburger with mustard / tomato soup and glass of water / dinner - Salmon , broccoli and small amount of rice    06/09/2020 - Breakfast the same as 10/85/46/2703nch-  Slice of pizza , salad , cup of sweet tea with splenda. Dinner - Fish, baked potato, coleslaw with hush puppies , diet coke.  06/10/2020 Breakfast the same as 06/08/2020.  She states it is discouraging to talk about her problems so much  Will push out next follow up for a month  She feels sluggish a lot and is sort of discouraged about not feeling any different since changing diet and taking all of her meds  Encouraged her that while she may not feel different she is preventing further complications by keeping sugars controlled  FBG goal 80-130  2 HR PP goal <180  Continue current insulin dosing  She also denies hypoglycemia  Plan Continue current medications  1 month follow up   Hypertension   Office blood pressures are  BP Readings from Last 3 Encounters:  05/03/20 140/76  03/30/20 129/81  03/22/20 (!) 141/84   Patient has failed these meds in the past: Carvedilol, clonidine, metoprolol, valsartan/HCTZ, losartan/HCTZ Patient is currently uncontrolled on  the following medications:   Amlodipine 56m77mily  Spironolactone 50mg25mly  Patient checks BP at home daily  Patient home BP readings are ranging: 154/76, 167/82,  We discussed:  Goal BP <130/80  Recommend she continue to monitor and bring in log  She has OV in Dec and if elevated at that time may consider additional medication such as Valsartan 80mg 46mn Continue current medications  Collaborate with PCP about adding valsartan 80mg p42mDec OV  Medication Management   Pt uses [facility] for all medications  Pt endorses 100% compliance  We discussed:   Importance of medication adherence  Plan Utilize [facility] pharmacy for medication synchronization, packaging and delivery    ChristiBeverly MilchD Clinical  Pharmacist [place] [phone]

## 2020-06-10 NOTE — Chronic Care Management (AMB) (Signed)
Chronic Care Management Pharmacy Assistant   Name: [name]  MRN: [id number] DOB: [birth year]    Reason for Encounter: Disease State - Diabetes Adherence Call.  Patient Questions:  1.  Have you seen any other providers since your last visit?  No  2.  Any changes in your medicines or health? No    PCP : [name], MD  Allergies:   Allergies  Allergen Reactions  . Pollen Extract Other (See Comments)    Congestion/sneezing  . Tomato Hives and Itching  . Codeine Hives, Itching and Nausea And Vomiting    Medications: Outpatient Encounter Medications as of 06/10/2020  Medication Sig  . amLODipine (NORVASC) 10 MG tablet TAKE ONE TABLET BY MOUTH EVERYDAY AT BEDTIME  . Continuous Blood Gluc Receiver (FREESTYLE LIBRE READER) DEVI 1 each by Does not apply route See admin instructions. CGM device-Freestyle Libre  . Continuous Blood Gluc Sensor (FREESTYLE LIBRE 14 DAY SENSOR) MISC 1 each by Does not apply route See admin instructions. CGM 14-day sensors; send refills to [facility] (in [place])   . dapagliflozin propanediol (FARXIGA) 5 MG TABS tablet Take 2 tablets (10 mg total) by mouth daily.  [name] docusate sodium (COLACE) 100 MG capsule Take 100 mg by mouth daily as needed for mild constipation.   . gabapentin (NEURONTIN) 300 MG capsule TAKE 1 CAPSULE(300 MG) BY MOUTH THREE TIMES DAILY  . insulin degludec (TRESIBA FLEXTOUCH) 100 UNIT/ML FlexTouch Pen Inject 18 Units into the skin daily.  . insulin lispro (HUMALOG KWIKPEN) 100 UNIT/ML KwikPen Take 25 units Maries breakfast and dinner and 35 units at lunch  . Insulin Pen Needle (BD PEN NEEDLE MICRO U/F) 32G X 6 MM MISC DX:E11.65 USE TO CHECK BLOOD SUGARS THREE TIMES A DAY  . Lancets Misc. MISC 1 each by Does not apply route 4 (four) times daily -  with meals and at bedtime.  [name] lubiprostone (AMITIZA) 8 MCG capsule Take 1 capsule (8 mcg total) by mouth daily with breakfast.  . mirabegron ER (MYRBETRIQ) 50 MG TB24 tablet Take 1  tablet (50 mg total) by mouth daily.  . mometasone-formoterol (DULERA) 100-5 MCG/ACT AERO Inhale 2 puffs into the lungs as needed for wheezing.  (Patient not taking: Reported on 05/03/2020)  . Multiple Vitamin (MULTIVITAMIN WITH MINERALS) TABS tablet Take 1 tablet by mouth daily.  . ondansetron (ZOFRAN) 4 MG tablet Take 1 tablet (4 mg total) by mouth every 6 (six) hours as needed for nausea or vomiting.  . pantoprazole (PROTONIX) 40 MG tablet Take 30- 60 min before your first and last meals of the day  . pravastatin (PRAVACHOL) 80 MG tablet Take 0.5 tablets (40 mg total) by mouth daily.  . Semaglutide, 1 MG/DOSE, (OZEMPIC, 1 MG/DOSE,) 2 MG/1.5ML SOPN INJECT 1MG INTO HE SKIN EVERY Thursday  . spironolactone (ALDACTONE) 50 MG tablet TAKE ONE TABLET BY MOUTH EVERY MORNING  . Vitamin D, Ergocalciferol, (DRISDOL) 1.25 MG (50000 UNIT) CAPS capsule Take 1 capsule (50,000 Units total) by mouth every 7 (seven) days. TAKE 1 CAPSULE BY MOUTH EVERY 7 DAYS ON WEDNESDAYS   No facility-administered encounter medications on file as of 06/10/2020.    Current Diagnosis: Patient Active Problem List   Diagnosis Date Noted  . S/P lumbar laminectomy 03/30/2020  . Localized swelling, mass and lump, multiple sites 03/27/2018  . Type II diabetes mellitus with renal manifestations ([place]) 12/27/2017  . HLD (hyperlipidemia) 12/27/2017  . HTN (hypertension) 12/27/2017  . CKD (chronic kidney disease), stage III ([place]) 12/27/2017  .  Cough variant asthma 12/01/2015  . Osteoarthritis of left knee, primary localized 08/17/2014  . Knee osteoarthritis 08/17/2014  . Hypoglycemia 11/15/2013  . Other and unspecified hyperlipidemia 08/06/2013  . Type II diabetes mellitus, uncontrolled ([place]) 07/20/2013  . Varicose veins of lower extremities with other complications 92/06/9416  . Fibromyalgia   . GERD 05/18/2010  . ABDOMINAL PAIN, GENERALIZED 05/02/2010  . OBESITY 04/20/2010  . BURSITIS, LEFT SHOULDER 03/09/2010  . Lipoma of  arm s/p excision 01/29/2014 01/10/2010  . DEPRESSION 12/27/2009  . BACK PAIN WITH RADICULOPATHY 12/27/2009  . CYSTITIS, ACUTE 10/21/2009  . MICROSCOPIC HEMATURIA 10/18/2009  . KNEE PAIN, BILATERAL 10/18/2009  . NEUROPATHY 09/08/2009  . Asthma 09/08/2009  . STRESS INCONTINENCE 09/08/2009  . CHEST PAIN 07/19/2009  . ESOPHAGEAL STRICTURE 08/27/2005  . Diabetes mellitus without complication ([place]) 40/81/4481   Recent Relevant Labs: Lab Results  Component Value Date/Time   HGBA1C 11.4 (H) 05/03/2020 02:55 PM   HGBA1C 5.9 (H) 12/31/2019 04:11 PM   MICROALBUR 80 12/31/2019 03:19 PM   MICROALBUR 6.9 (H) 09/07/2016 01:41 PM   MICROALBUR 7.2 (H) 08/30/2015 01:59 PM    Kidney Function Lab Results  Component Value Date/Time   CREATININE 1.24 (H) 05/03/2020 02:55 PM   CREATININE 1.21 (H) 02/19/2020 09:51 AM   GFR 51.07 (L) 05/17/2017 08:54 AM   GFRNONAA 45 (L) 05/03/2020 02:55 PM   GFRAA 52 (L) 05/03/2020 02:55 PM    . Current antihyperglycemic regimen:  o Ozempic 1 mg weekly o Humalog 25 units before breakfast 35 units before lunch and 23 units before dinner o Farxiga 10 mg once daily o Tresiba 18 units nightly  . What recent interventions/DTPs have been made to improve glycemic control:  o Patient is taking her medications as directed by provider  . Have there been any recent hospitalizations or ED visits since last visit with CPP?  No  . Patient denies hypoglycemic symptoms, including Pale, Sweaty, Shaky, Hungry, Nervous/irritable and Vision changes . Patient denies hyperglycemic symptoms, including blurry vision, excessive thirst, fatigue, polyuria and weakness   . How often are you checking your blood sugar?  Patient states she takes six times a day.  o What are your blood sugars ranging?  o Fasting: 06/07/2020 - 151 o Fasting: 06/08/2020- 147 o Fasting: 06/09/2020- 187 o Fasting: 06/10/2020- 167 o Before meals 06/07/2020 Before lunch 168 before dinner 171 o Before meals:  06/08/2020 before lunch 171/ before dinner 161 o Before meals: 06/09/2020 before lunch 191/ before dinner 167 o After meals: 06/07/2020 After breakfast 171 after lunch 181 after dinner 191 o After meals: 06/08/2020 After breakfast - 167 after lunch 183 / after dinner 177 o After meals: 06/09/2020 After breakfast - 179 after lunch 187 / after dinner 182 o After meals: 06/10/2020 After breakfast - 172  o Bedtime: none  . During the week, how often does your blood glucose drop below 70? No  . Are you checking your feet daily/regularly? Yes , Patient denies any open sores/ no tingling in feet, states her left foot is doing better, swelling has gone down. Patient did complain of tingling in right leg,states gabapentin is helping.  Adherence Review: Is the patient currently on a STATIN medication? Yes. Pravastatin  Is the patient currently on ACE/ARB medication? No Does the patient have >5 day gap between last estimated fill dates? No      Goals Addressed            This Visit's Progress   . Pharmacy Care  Plan   On track    CARE PLAN ENTRY  Current Barriers:  . Chronic Disease Management support, education, and care coordination needs related to Hypertension, Hyperlipidemia, and Diabetes   Hypertension . Pharmacist Clinical Goal(s): o Over the next 45 days, patient will work with PharmD and providers to achieve BP goal <130/80 . Current regimen:  o Amlodipine 59m daily o Spironolactone 551mdaily . Interventions: o Discussed proper blood pressure monitoring technique (rest for at least 5 minutes, arm at heart level resting on a hard surface, after taking blood pressure medications, etc) o Reviewed recent home blood pressure readings o Will collaborate with PCP about adding valsartan 8065maily . Patient self care activities - Over the next 45 days, patient will: o Check BP once daily and if symptomatic, document, and provide at future appointments o Ensure daily salt intake <  2300 mg/day  Hyperlipidemia . Pharmacist Clinical Goal(s): o Over the next 45 days, patient will work with PharmD and providers to achieve LDL goal < 70 . Current regimen:  o Pravastatin 50m61m2 tablet daily . Interventions: o Provided dietary recommendations . Patient self care activities - Over the next 45 days, patient will: o Focus on a heart-healthy, balanced diet as discussed  Diabetes . Pharmacist Clinical Goal(s): o Over the next 45 days, patient will work with PharmD and providers to achieve A1c goal <7% . Current regimen:  o Ozempic 1mg 32mkly o Humalog 25 units before breakfast, 35 units before lunch, and 23 units before dinner o Farxiga 10mg 70m daily o Tresiba 18 units nightly . Interventions: o Dietary recommendations provided - Advised patient to decrease carbohydrates with each meal to 45-60 grams (goal of 50 grams) - Advised patient not to skip meals, especially if taking insulin - Advised patient to make sure she is eating balanced meals (PLATE method, protein with each meal) - Provided Daily Diabetes Meal Planning Guide and My Carbohydrate Guide to patient o Reviewed recent blood glucose readings o Discussed appropriate goals for fasting blood sugar (80-130) and 2 hours after eating (less than 180) o Discussed the differences between Ozempic, Tresiba, and Humalog - Each medication serves a different purpose (has a different mechanism of action) and works for a different amount of time . Patient self care activities - Over the next 45 days, patient will: o Check blood sugar continuously, but at least 3-4 times daily, document, and provide at future appointments o Contact provider with any episodes of hypoglycemia o Increase water intake by 1 glass a day (goal 64 ounces daily) o Continue Tresiba 18 units daily before bedtime  o Decrease carbohydrates eaten at each meal (goal 45-60 grams) o Not skip meals o Eat lean protein with every meal  Medication  management . Pharmacist Clinical Goal(s): o Over the next 90 days, patient will work with PharmD and providers to maintain optimal medication adherence . Current pharmacy: [facility] . Interventions o Comprehensive medication review performed. o Utilize [facility] for medication synchronization, packaging and delivery . Patient self care activities - Over the next 90 days, patient will: o Focus on medication adherence by using adherence packaging o Take medications as prescribed o Report any questions or concerns to PharmD and/or provider(s)  Please see past updates related to this goal by clicking on the "Past Updates" button in the selected goal         Follow-Up:  Pharmacist Review - Patient states she has two  pieces of toast , oatmeal and glass of  orange juice/ every morning and one day a week she adds 2 boiled eggs with the meal.  06/07/2020- lunch - tuna sandwich on wheat bread, broccoli soup,salad / dinner baked chicken , mac and cheese,turnip greens/ dinner roll. Tea with splenda  06/08/2020 - Breakfast the same as 06/07/2020/ lunch- Cheeseburger with mustard / tomato soup and glass of water / dinner - Salmon , broccoli and small amount of rice    06/09/2020 - Breakfast the same as 53/91/2258 Lunch-  Slice of pizza , salad , cup of sweet tea with splenda. Dinner - Fish, baked potato, coleslaw with hush puppies , diet coke.  06/10/2020 Breakfast the same as 06/08/2020.     Beverly Milch, CPP. Notified   [name], [place] Pharmacist Assistant [phone]

## 2020-06-10 NOTE — Patient Instructions (Addendum)
Visit Information Thank you for meeting with me today!  I look forward to working with you to help you meet all of your healthcare goals and answer any questions you may have.  Feel free to contact me anytime!  Goals Addressed            This Visit's Progress   . Pharmacy Care Plan       CARE PLAN ENTRY  Current Barriers:  . Chronic Disease Management support, education, and care coordination needs related to Hypertension, Hyperlipidemia, and Diabetes   Hypertension . Pharmacist Clinical Goal(s): o Over the next 45 days, patient will work with PharmD and providers to achieve BP goal <130/80 . Current regimen:  o Amlodipine 10mg  daily o Spironolactone 50mg  daily . Interventions: o Discussed proper blood pressure monitoring technique (rest for at least 5 minutes, arm at heart level resting on a hard surface, after taking blood pressure medications, etc) o Reviewed recent home blood pressure readings o Will collaborate with PCP about adding valsartan 80mg  daily . Patient self care activities - Over the next 45 days, patient will: o Check BP once daily and if symptomatic, document, and provide at future appointments o Ensure daily salt intake < 2300 mg/day  Hyperlipidemia . Pharmacist Clinical Goal(s): o Over the next 45 days, patient will work with PharmD and providers to achieve LDL goal < 70 . Current regimen:  o Pravastatin 80mg  1/2 tablet daily . Interventions: o Provided dietary recommendations . Patient self care activities - Over the next 45 days, patient will: o Focus on a heart-healthy, balanced diet as discussed  Diabetes . Pharmacist Clinical Goal(s): o Over the next 30 days, patient will work with PharmD and providers to achieve A1c goal <7% . Current regimen:  o Ozempic 1mg  weekly o Humalog 25 units before breakfast, 35 units before lunch, and 23 units before dinner o Farxiga 10mg  once daily o Tresiba 18 units nightly . Interventions: o Dietary  recommendations provided - Advised her to not get discouraged and continue smart dietary choices, just because she doesn't necessarily feel a difference does not mean she is not making a difference in her health. o Reviewed recent blood glucose readings o Discussed appropriate goals for fasting blood sugar (80-130) and 2 hours after eating (less than 180) . Patient self care activities - Over the next 30 days, patient will: o Check blood sugar continuously, but at least 3-4 times daily, document, and provide at future appointments o Contact provider with any episodes of hypoglycemia o Continue diet and exercise as current o Continue to take medications as directed  Medication management . Pharmacist Clinical Goal(s): o Over the next 90 days, patient will work with PharmD and providers to maintain optimal medication adherence . Current pharmacy: [facility] . Interventions o Comprehensive medication review performed. o Utilize [facility] for medication synchronization, packaging and delivery . Patient self care activities - Over the next 90 days, patient will: o Focus on medication adherence by using adherence packaging o Take medications as prescribed o Report any questions or concerns to PharmD and/or provider(s)  Please see past updates related to this goal by clicking on the "Past Updates" button in the selected goal         The patient verbalized understanding of instructions provided today and agreed to receive a mailed copy of patient instruction and/or educational materials.  Telephone follow up appointment with pharmacy team member scheduled for: 1 month  Beverly Milch, PharmD Clinical Pharmacist [facility]  Medicine [phone]   Hyperglycemia Hyperglycemia occurs when the level of sugar (glucose) in the blood is too high. Glucose is a type of sugar that provides the body's main source of energy. Certain hormones (insulin and glucagon) control  the level of glucose in the blood. Insulin lowers blood glucose, and glucagon increases blood glucose. Hyperglycemia can result from having too little insulin in the bloodstream, or from the body not responding normally to insulin. Hyperglycemia occurs most often in people who have diabetes (diabetes mellitus), but it can happen in people who do not have diabetes. It can develop quickly, and it can be life-threatening if it causes you to become severely dehydrated (diabetic ketoacidosis or hyperglycemic hyperosmolar state). Severe hyperglycemia is a medical emergency. What are the causes? If you have diabetes, hyperglycemia may be caused by:  Diabetes medicine.  Medicines that increase blood glucose or affect your diabetes control.  Not eating enough, or not eating often enough.  Changes in physical activity level.  Being sick or having an infection. If you have prediabetes or undiagnosed diabetes:  Hyperglycemia may be caused by those conditions. If you do not have diabetes, hyperglycemia may be caused by:  Certain medicines, including steroid medicines, beta-blockers, epinephrine, and thiazide diuretics.  Stress.  Serious illness.  Surgery.  Diseases of the pancreas.  Infection. What increases the risk? Hyperglycemia is more likely to develop in people who have risk factors for diabetes, such as:  Having a family member with diabetes.  Having a gene for type 1 diabetes that is passed from parent to child (inherited).  Living in an area with cold weather conditions.  Exposure to certain viruses.  Certain conditions in which the body's disease-fighting (immune) system attacks itself (autoimmune disorders).  Being overweight or obese.  Having an inactive (sedentary) lifestyle.  Having been diagnosed with insulin resistance.  Having a history of prediabetes, gestational diabetes, or polycystic ovarian syndrome (PCOS).  Being of American-Indian, African-American,  Hispanic/Latino, or Asian/Pacific Islander descent. What are the signs or symptoms? Hyperglycemia may not cause any symptoms. If you do have symptoms, they may include early warning signs, such as:  Increased thirst.  Hunger.  Feeling very tired.  Needing to urinate more often than usual.  Blurry vision. Other symptoms may develop if hyperglycemia gets worse, such as:  Dry mouth.  Loss of appetite.  Fruity-smelling breath.  Weakness.  Unexpected or rapid weight gain or weight loss.  Tingling or numbness in the hands or feet.  Headache.  Skin that does not quickly return to normal after being lightly pinched and released (poor skin turgor).  Abdominal pain.  Cuts or bruises that are slow to heal. How is this diagnosed? Hyperglycemia is diagnosed with a blood test to measure your blood glucose level. This blood test is usually done while you are having symptoms. Your health care provider may also do a physical exam and review your medical history. You may have more tests to determine the cause of your hyperglycemia, such as:  A fasting blood glucose (FBG) test. You will not be allowed to eat (you will fast) for at least 8 hours before a blood sample is taken.  An A1c (hemoglobin A1c) blood test. This provides information about blood glucose control over the previous 2-3 months.  An oral glucose tolerance test (OGTT). This measures your blood glucose at two times: ? After fasting. This is your baseline blood glucose level. ? Two hours after drinking a beverage that contains glucose. How is this  treated? Treatment depends on the cause of your hyperglycemia. Treatment may include:  Taking medicine to regulate your blood glucose levels. If you take insulin or other diabetes medicines, your medicine or dosage may be adjusted.  Lifestyle changes, such as exercising more, eating healthier foods, or losing weight.  Treating an illness or infection, if this caused your  hyperglycemia.  Checking your blood glucose more often.  Stopping or reducing steroid medicines, if these caused your hyperglycemia. If your hyperglycemia becomes severe and it results in hyperglycemic hyperosmolar state, you must be hospitalized and given IV fluids. Follow these instructions at home:  General instructions  Take over-the-counter and prescription medicines only as told by your health care provider.  Do not use any products that contain nicotine or tobacco, such as cigarettes and e-cigarettes. If you need help quitting, ask your health care provider.  Limit alcohol intake to no more than 1 drink per day for nonpregnant women and 2 drinks per day for men. One drink equals 12 oz of beer, 5 oz of wine, or 1 oz of hard liquor.  Learn to manage stress. If you need help with this, ask your health care provider.  Keep all follow-up visits as told by your health care provider. This is important. Eating and drinking   Maintain a healthy weight.  Exercise regularly, as directed by your health care provider.  Stay hydrated, especially when you exercise, get sick, or spend time in hot temperatures.  Eat healthy foods, such as: ? Lean proteins. ? Complex carbohydrates. ? Fresh fruits and vegetables. ? Low-fat dairy products. ? Healthy fats.  Drink enough fluid to keep your urine clear or pale yellow. If you have diabetes:  Make sure you know the symptoms of hyperglycemia.  Follow your diabetes management plan, as told by your health care provider. Make sure you: ? Take your insulin and medicines as directed. ? Follow your exercise plan. ? Follow your meal plan. Eat on time, and do not skip meals. ? Check your blood glucose as often as directed. Make sure to check your blood glucose before and after exercise. If you exercise longer or in a different way than usual, check your blood glucose more often. ? Follow your sick day plan whenever you cannot eat or drink  normally. Make this plan in advance with your health care provider.  Share your diabetes management plan with people in your workplace, school, and household.  Check your urine for ketones when you are ill and as told by your health care provider.  Carry a medical alert card or wear medical alert jewelry. Contact a health care provider if:  Your blood glucose is at or above 240 mg/dL (13.3 mmol/L) for 2 days in a row.  You have problems keeping your blood glucose in your target range.  You have frequent episodes of hyperglycemia. Get help right away if:  You have difficulty breathing.  You have a change in how you think, feel, or act (mental status).  You have nausea or vomiting that does not go away. These symptoms may represent a serious problem that is an emergency. Do not wait to see if the symptoms will go away. Get medical help right away. Call your local emergency services (911 in the [place] Do not drive yourself to the hospital. Summary  Hyperglycemia occurs when the level of sugar (glucose) in the blood is too high.  Hyperglycemia is diagnosed with a blood test to measure your blood glucose level. This blood test  is usually done while you are having symptoms. Your health care provider may also do a physical exam and review your medical history.  If you have diabetes, follow your diabetes management plan as told by your health care provider.  Contact your health care provider if you have problems keeping your blood glucose in your target range. This information is not intended to replace advice given to you by your health care provider. Make sure you discuss any questions you have with your health care provider. Document Revised: 04/30/2016 Document Reviewed: 04/30/2016 Elsevier Patient Education  [place].

## 2020-06-15 ENCOUNTER — Ambulatory Visit: Payer: Self-pay

## 2020-06-15 ENCOUNTER — Other Ambulatory Visit: Payer: Self-pay

## 2020-06-15 ENCOUNTER — Telehealth: Payer: Medicare Other

## 2020-06-15 DIAGNOSIS — E1165 Type 2 diabetes mellitus with hyperglycemia: Secondary | ICD-10-CM

## 2020-06-15 DIAGNOSIS — E785 Hyperlipidemia, unspecified: Secondary | ICD-10-CM

## 2020-06-15 DIAGNOSIS — I10 Essential (primary) hypertension: Secondary | ICD-10-CM

## 2020-06-16 NOTE — Chronic Care Management (AMB) (Signed)
Chronic Care Management   Follow Up Note   06/15/2020 Name: [name] MRN: [id number] DOB: Mar 20, 1951  Referred by: [name], MD Reason for referral : Chronic Care Management (CCM RNCM FU Call)   [name] is a 69 y.o. year old female who is a primary care patient of [name], MD. The CCM team was consulted for assistance with chronic disease management and care coordination needs.    Review of patient status, including review of consultants reports, relevant laboratory and other test results, and collaboration with appropriate care team members and the patient's provider was performed as part of comprehensive patient evaluation and provision of chronic care management services.    SDOH (Social Determinants of Health) assessments performed: Yes - no acute needs identified at this time See Care Plan activities for detailed interventions related to [place])   Placed outbound CCM RN CM follow up call for a care plan update.    Outpatient Encounter Medications as of 06/15/2020  Medication Sig  . amLODipine (NORVASC) 10 MG tablet TAKE ONE TABLET BY MOUTH EVERYDAY AT BEDTIME  . Continuous Blood Gluc Receiver (FREESTYLE LIBRE READER) DEVI 1 each by Does not apply route See admin instructions. CGM device-Freestyle Libre  . Continuous Blood Gluc Sensor (FREESTYLE LIBRE 14 DAY SENSOR) MISC 1 each by Does not apply route See admin instructions. CGM 14-day sensors; send refills to [facility] (in [place])   . dapagliflozin propanediol (FARXIGA) 5 MG TABS tablet Take 2 tablets (10 mg total) by mouth daily.  [name] docusate sodium (COLACE) 100 MG capsule Take 100 mg by mouth daily as needed for mild constipation.   . gabapentin (NEURONTIN) 300 MG capsule TAKE 1 CAPSULE(300 MG) BY MOUTH THREE TIMES DAILY  . insulin degludec (TRESIBA FLEXTOUCH) 100 UNIT/ML FlexTouch Pen Inject 18 Units into the skin daily.  . insulin lispro (HUMALOG KWIKPEN) 100 UNIT/ML KwikPen Take 25 units   breakfast and dinner and 35 units at lunch  . Insulin Pen Needle (BD PEN NEEDLE MICRO U/F) 32G X 6 MM MISC DX:E11.65 USE TO CHECK BLOOD SUGARS THREE TIMES A DAY  . Lancets Misc. MISC 1 each by Does not apply route 4 (four) times daily -  with meals and at bedtime.  [name] lubiprostone (AMITIZA) 8 MCG capsule Take 1 capsule (8 mcg total) by mouth daily with breakfast.  . mirabegron ER (MYRBETRIQ) 50 MG TB24 tablet Take 1 tablet (50 mg total) by mouth daily.  . mometasone-formoterol (DULERA) 100-5 MCG/ACT AERO Inhale 2 puffs into the lungs as needed for wheezing.  (Patient not taking: Reported on 05/03/2020)  . Multiple Vitamin (MULTIVITAMIN WITH MINERALS) TABS tablet Take 1 tablet by mouth daily.  . ondansetron (ZOFRAN) 4 MG tablet Take 1 tablet (4 mg total) by mouth every 6 (six) hours as needed for nausea or vomiting.  . pantoprazole (PROTONIX) 40 MG tablet Take 30- 60 min before your first and last meals of the day  . pravastatin (PRAVACHOL) 80 MG tablet Take 0.5 tablets (40 mg total) by mouth daily.  . Semaglutide, 1 MG/DOSE, (OZEMPIC, 1 MG/DOSE,) 2 MG/1.5ML SOPN INJECT 1MG  INTO HE SKIN EVERY Thursday  . spironolactone (ALDACTONE) 50 MG tablet TAKE ONE TABLET BY MOUTH EVERY MORNING  . Vitamin D, Ergocalciferol, (DRISDOL) 1.25 MG (50000 UNIT) CAPS capsule Take 1 capsule (50,000 Units total) by mouth every 7 (seven) days. TAKE 1 CAPSULE BY MOUTH EVERY 7 DAYS ON WEDNESDAYS   No facility-administered encounter medications on file as of 06/15/2020.  Objective:  Lab Results  Component Value Date   HGBA1C 11.4 (H) 05/03/2020   HGBA1C 5.9 (H) 12/31/2019   HGBA1C 9.0 (H) 10/22/2019   Lab Results  Component Value Date   MICROALBUR 80 12/31/2019   LDLCALC 98 12/31/2019   CREATININE 1.24 (H) 05/03/2020   BP Readings from Last 3 Encounters:  05/03/20 140/76  03/30/20 129/81  03/22/20 (!) 141/84    Goals Addressed      Patient Stated   .  "to fully recover from back surgery" (pt-stated)         [place] (see longitudinal plan of care for additional care plan information)  Current Barriers:  [name] Knowledge Deficits related to disease process and Self Health Management of Herniated lumbar intervertebral disc . Chronic Disease Management support and education needs related to DM, HTN, HLD  Nurse Case Manager Clinical Goal(s):  [name] Over the next 60 days, patient will work with her Orthopedic team to address needs related to status post healing and recovery of right L4 hemiaminectomy, microdiscectomy   CCM RN CM Interventions:  05/04/20 call completed with patient  . Inter-disciplinary care team collaboration (see longitudinal plan of care) . Evaluation of current treatment plan related to s/p right L4 hemiaminectomy, microdiscectomy and patient's adherence to plan as established by provider . Determined patient underwent a hemiaminectomy, microdiscectomy to right L4 on 02/19/20 at [facility] performed by Neurosurgeon Dr. Lorin Mercy, secondary to developing a herniated disc . Discussed and reviewed the following hospital discharge note;  o Hospital Course: [name] is an 69 y.o. female who was admitted 02/19/2020 for operative treatment of lumbar HNP. Patient failed conservative treatments (please see the history and physical for the specifics) and had severe unremitting pain that affects sleep, daily activities and work/hobbies. After pre-op clearance, the patient was taken to the operating room on 02/19/2020 and underwent  Procedure(s): o right L4 hemiaminectomy, microdiscectomy.   o   o Patient was given perioperative antibiotics:  o  o  o  o  o  o  o  o  o  o  o  Anti-infectives (From admission, onward) o   o    o Start o   o   o Dose/Rate o Route o Frequency o Ordered o Stop o    o 02/19/20 1900 o   o ceFAZolin (ANCEF) IVPB 1 g/50 mL premix      o   o 1 g o 100 mL/hr over 30 Minutes o Intravenous o Every 8 hours o 02/19/20 1650 o 02/20/20 0243 o    o      o Patient was given sequential compression devices and early ambulation to prevent DVT. o Patient benefited maximally from hospital stay and there were no complications. At the time of discharge, the patient was urinating/moving their bowels without difficulty, tolerating a regular diet, pain is controlled with oral pain medications and they have been cleared by PT/OT.  [name] Determined patient is recovering well at her home and has caregiver assistance at home, she is ambulating with use of a cane and walker, has all appropriate DME needed and is aware of importance to be active as tolerated to help avoid DVT and or upper respiratory infection  . Determined patient's wound has healed, she is staying well hydrated and has minimal pain, she is adhering to her ADA diet, FBS are stablizing . Discussed plans with patient for ongoing care management follow up and provided patient with direct contact information  for care management team . Reviewed scheduled/upcoming provider appointments including: Orthopedics, Neurosurgery and PCP f/u appointments have been scheduled, discussed date/time  Patient Self Care Activities:  . Self administers medications as prescribed . Attends all scheduled provider appointments . Calls pharmacy for medication refills . Calls provider office for new concerns or questions  Initial goal documentation     .  "to keep my A1c within normal range" (pt-stated)   Not on track      . CARE PLAN ENTRY . (see longitudinal plan of care for additional care plan information) Current Barriers:  [name] Knowledge Deficits related to disease process and Self Health management of DM . Chronic Disease Management support and education needs related to DM, HTN, HLD   Nurse Case Manager Clinical Goal(s):  . New 06/15/20 Over the next 180 days, patient will work with the CCM team and PCP to address needs related to disease education and support to help improve patient's Self Health Management of  her DM as evidence by patient will maintain an A1c <7.0 %  CCM RN CM Interventions:  06/15/20 call completed with patient  . Inter-disciplinary care team collaboration (see longitudinal plan of care) . Evaluation of current treatment plan related to DM and patient's adherence to plan as established by provider . Reviewed and discussed current A1c has increased to 11.4 %; Re-educated on target A1c <7.0; Re-educated on dietary and exercise recommendations; . Re-educated on daily glycemic target ranges, FBS 80-130, <180 after meals  . Determined patient continues to have the free style Libre Scanner to help monitor her sugars, discussed average FBS runs in the mid to upper 100's . Determined patient is adhering to her DM medication regimen as prescribed by PCP, current regimen: Current antihyperglycemic regimen:  ? Ozempic 1 mg weekly ? Humalog 25 units before breakfast 35 units before lunch and 23 units before dinner ? Farxiga 10 mg once daily ? Tresiba 18 units nightly . Provided patient with printed educational materials related to Diabetes Management using Meal Planning; Preventing Complications from Diabetes; Grocery Shopping with Diabetes; Diabetes Care Schedule, Diabetes and Kidney disease . Discussed plans with patient for ongoing care management follow up and provided patient with direct contact information for care management team  Patient Self Care Activities:  . Self administers medications as prescribed . Attends all scheduled provider appointments . Calls pharmacy for medication refills . Calls provider office for new concerns or questions  Please see past updates related to this goal by clicking on the "Past Updates" button in the selected goal        Plan:   Telephone follow up appointment with care management team member scheduled for: 08/04/20  [name], RN, BSN, CCM Care Management Coordinator [facility]  Direct Phone:  [phone]

## 2020-06-27 ENCOUNTER — Telehealth: Payer: Self-pay | Admitting: Pharmacist

## 2020-06-27 ENCOUNTER — Other Ambulatory Visit: Payer: Self-pay

## 2020-06-27 MED ORDER — TRESIBA FLEXTOUCH 100 UNIT/ML ~~LOC~~ SOPN: 18.0000 [IU] | PEN_INJECTOR | Freq: Every day | SUBCUTANEOUS | 1 refills | Status: DC

## 2020-06-28 ENCOUNTER — Other Ambulatory Visit: Payer: Self-pay

## 2020-06-28 MED ORDER — TRESIBA FLEXTOUCH 100 UNIT/ML ~~LOC~~ SOPN: 18.0000 [IU] | PEN_INJECTOR | Freq: Every day | SUBCUTANEOUS | 1 refills | Status: DC

## 2020-07-01 ENCOUNTER — Other Ambulatory Visit: Payer: Self-pay

## 2020-07-01 ENCOUNTER — Ambulatory Visit (INDEPENDENT_AMBULATORY_CARE_PROVIDER_SITE_OTHER): Payer: Medicare Other | Admitting: Orthopaedic Surgery

## 2020-07-01 DIAGNOSIS — M961 Postlaminectomy syndrome, not elsewhere classified: Secondary | ICD-10-CM

## 2020-07-01 MED ORDER — PREDNISONE 10 MG (21) PO TBPK: ORAL_TABLET | ORAL | 0 refills | Status: DC

## 2020-07-01 NOTE — Progress Notes (Signed)
Office Visit Note   Patient: [name]           Date of Birth: February 08, 1951           MRN: [id number] Visit Date: 07/01/2020              Requested by: [name], [address] [place],  [place] [address] PCP: [name], MD   Assessment & Plan: Visit Diagnoses:  1. Postlaminectomy syndrome, not elsewhere classified     Plan: We discussed concern for possibility of recurrent disc herniation with her recurrent symptoms.  We will place her on a prednisone Dosepak.  Recheck her in 3 weeks.  Follow-Up Instructions: Return in about 3 weeks (around 07/22/2020).   Orders:  No orders of the defined types were placed in this encounter.  Meds ordered this encounter  Medications  . predniSONE (STERAPRED UNI-PAK 21 TAB) 10 MG (21) TBPK tablet    Sig: Take 2 tablets the first day then one tablet daily for one week    Dispense:  21 tablet    Refill:  0      Procedures: No procedures performed   Clinical Data: No additional findings.   Subjective: Chief Complaint  Patient presents with  . Lower Back - Follow-up    02/19/2020 Right L4 hemilam, microdiscectomy    HPI 69 year old female was doing well until she had increased pain the last 3 weeks.  Previous right L4-5 hemilaminectomy microdiscectomy 02/19/2020.  Patient is used ice heat and Advil without relief.  Patient states she has pain shooting down both legs after standing and feels like it was similar to the pain patient had preoperatively.  After surgery patient did well for several months.  Review of Systems all other systems are negative no fever chills no associated bowel bladder symptoms.  Unchanged fromJune outlines it surgery review of systems.   Objective: Vital Signs: BP 135/83   Ht 5\' 4"  (1.626 m)   Wt 154 lb (69.9 kg)   LMP  (LMP Unknown)   BMI 26.43 kg/m   Physical Exam Constitutional:      Appearance: She is well-developed.  HENT:     Head: Normocephalic.     Right  Ear: External ear normal.     Left Ear: External ear normal.  Eyes:     Pupils: Pupils are equal, round, and reactive to light.  Neck:     Thyroid: No thyromegaly.     Trachea: No tracheal deviation.  Cardiovascular:     Rate and Rhythm: Normal rate.  Pulmonary:     Effort: Pulmonary effort is normal.  Abdominal:     Palpations: Abdomen is soft.  Skin:    General: Skin is warm and dry.  Neurological:     Mental Status: She is alert and oriented to person, place, and time.  Psychiatric:        Behavior: Behavior normal.     Ortho Exam patient has some pain with straight leg raising well-healed lumbar incision.  Negative straight leg raising on the left.  Specialty Comments:  No specialty comments available.  Imaging: No results found.   PMFS History: Patient Active Problem List   Diagnosis Date Noted  . Postlaminectomy syndrome, not elsewhere classified 07/03/2020  . S/P lumbar laminectomy 03/30/2020  . Localized swelling, mass and lump, multiple sites 03/27/2018  . Type II diabetes mellitus with renal manifestations ([place]) 12/27/2017  . HLD (hyperlipidemia) 12/27/2017  . HTN (hypertension) 12/27/2017  .  CKD (chronic kidney disease), stage III ([place]) 12/27/2017  . Cough variant asthma 12/01/2015  . Osteoarthritis of left knee, primary localized 08/17/2014  . Knee osteoarthritis 08/17/2014  . Hypoglycemia 11/15/2013  . Other and unspecified hyperlipidemia 08/06/2013  . Type II diabetes mellitus, uncontrolled ([place]) 07/20/2013  . Varicose veins of lower extremities with other complications 54/27/0623  . Fibromyalgia   . GERD 05/18/2010  . ABDOMINAL PAIN, GENERALIZED 05/02/2010  . OBESITY 04/20/2010  . BURSITIS, LEFT SHOULDER 03/09/2010  . Lipoma of arm s/p excision 01/29/2014 01/10/2010  . DEPRESSION 12/27/2009  . BACK PAIN WITH RADICULOPATHY 12/27/2009  . CYSTITIS, ACUTE 10/21/2009  . MICROSCOPIC HEMATURIA 10/18/2009  . KNEE PAIN, BILATERAL 10/18/2009  . NEUROPATHY  09/08/2009  . Asthma 09/08/2009  . STRESS INCONTINENCE 09/08/2009  . CHEST PAIN 07/19/2009  . ESOPHAGEAL STRICTURE 08/27/2005  . Diabetes mellitus without complication ([place]) 76/28/3151   Past Medical History:  Diagnosis Date  . Aortic stenosis    mild on 04/07/19 echo  . Arthritis    L knee, hands, back   . Asthma   . Diabetes mellitus type 2, insulin dependent ([place])   . Fibromyalgia   . Full dentures   . Full dentures   . GERD (gastroesophageal reflux disease)   . H/O hiatal hernia   . Headache    h/o migraines - was followed for a time with a wellness doctor  . History of esophageal dilatation   . History of rhabdomyolysis    03/ 2015  . Hyperlipidemia   . Hypertension   . Insulin pump in place    since 12/ 2014--  [employer]  . Lumbar disc herniation    right leg weakness  . Osteoarthritis of left knee, primary localized 08/17/2014  . 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  . Renal insufficiency   . Rhabdomyolysis 11/15/2013  . SUI (stress urinary incontinence, female)   . Syncope 12/27/2017  . Wears glasses   . Wears glasses     Family History  Problem Relation Age of Onset  . Diabetes Mother   . Hypertension Mother   . Lung cancer Mother        smoked  . Diabetes Father   . Hypertension Father   . Lung cancer Father        smoked  . Diabetes Brother   . Heart failure Sister   . Diabetes Maternal Grandmother   . Cancer Maternal Grandmother     Past Surgical History:  Procedure Laterality Date  . BLADDER SUSPENSION N/A 03/09/2014   Procedure: CYSTOSCOPY/SLING;  Surgeon: Reece Packer, MD;  [facility];  Service: Urology;  Laterality: N/A;  . CARDIAC CATHETERIZATION  07-20-2009   DR Shelva Majestic   MODERATE LVH/  NORMAL  LVEF/  NORMAL CORONARY AND RENAL ARTERIES  . CARPAL TUNNEL RELEASE Right 2000  . CHOLECYSTECTOMY  1990  . COLONOSCOPY WITH ESOPHAGOGASTRODUODENOSCOPY (EGD)  06-18-2002  . ESOPHAGOGASTRODUODENOSCOPY (EGD) WITH ESOPHAGEAL  DILATION  06-12-2010  . EXCISION LEFT UPPER ARM MASS  01-29-2014  . EYE SURGERY  2010   laser on R eye  . INTERSTIM IMPLANT REMOVAL N/A 03/06/2019   Procedure: REMOVAL OF INTERSTIM IMPLANT;  Surgeon: Bjorn Loser, MD;  Location: [facility];  Service: Urology;  Laterality: N/A;  . KNEE ARTHROSCOPY Right 1989  &  2002  . LUMBAR LAMINECTOMY N/A 02/19/2020   Procedure: right L4 hemiaminectomy, microdiscectomy;  Surgeon: Marybelle Killings, MD;  Location: [facility];  Service: Orthopedics;  Laterality: N/A;  . MULTIPLE  TOOTH EXTRACTIONS    . NEGATIVE SLEEP STUDY  2012   PER PT  . PARTIAL KNEE ARTHROPLASTY Left 08/17/2014   Procedure: LEFT UNICOMPARTMENTAL KNEE;  Surgeon: Johnny Bridge, MD;  Location: [facility];  Service: Orthopedics;  Laterality: Left;   Social History   Occupational History  . Occupation: disability  Tobacco Use  . Smoking status: Never Smoker  . Smokeless tobacco: Never Used  Vaping Use  . Vaping Use: Never used  Substance and Sexual Activity  . Alcohol use: No  . Drug use: No  . Sexual activity: Not Currently

## 2020-07-01 NOTE — Chronic Care Management (AMB) (Signed)
Chronic Care Management Pharmacy Assistant   Name: [name]  MRN: [id number] DOB: [birth year]  Reason for Encounter: Medication Review/ Refill Request for [name]  PCP : Gwendolyn Chard, MD   06/27/2020- Patient called [name], CPA with complaints of elevated blood sugars and not receiving her Tresiba prescription, patient was very upset, [name], CPA informed that her manager will call regarding this situation.  Called patient back, Patient was very upset that her blood sugars remain high after not taking her medications as prescribed, the only thing missing in her Diabetes regimen was [name] pen in which she takes 18 units sq daily. Patient stated she was not doing anything differently. She is taking the [place] 5 mg- 2 tablets daily, Humalog Kwikpen 25 units at breakfast and dinner and 35 units at lunch. She is also taking Ozempic 1 mg sq every Thursday. Patient informed that she is eating the same food and actually doing better with her diet, more yogurt and salads so she is not sure why her blood sugars keep increasing.  Some blood sugar readings: 357-fasting am     445- after lunch     369- dinner     133- fasting am     214- fasting am     242- lunch     148- fasting am     402- fasting am Patient states she does not have any hyperglycemic symptoms, like dizziness, weakness or increased urine. Patient states she feels just fine. There are times she may have some shakiness and thinks her blood sugar is too high but it ranges in the 140's. Patient was very confused and just not happy with her Diabetes state.  Informed patient that I will really this information to the pharmacist and her PCP. Informed patient that we will continue a weekly Diabetes Disease State Call to check on her blood sugars and I will call [facility] to send her medication.   Allergies:   Allergies  Allergen Reactions   Pollen Extract Other (See Comments)     Congestion/sneezing   Tomato Hives and Itching   Codeine Hives, Itching and Nausea And Vomiting    Medications: Outpatient Encounter Medications as of 06/27/2020  Medication Sig   amLODipine (NORVASC) 10 MG tablet TAKE ONE TABLET BY MOUTH EVERYDAY AT BEDTIME   Continuous Blood Gluc Receiver (FREESTYLE LIBRE READER) DEVI 1 each by Does not apply route See admin instructions. CGM device-Freestyle Libre   Continuous Blood Gluc Sensor (FREESTYLE LIBRE 14 DAY SENSOR) MISC 1 each by Does not apply route See admin instructions. CGM 14-day sensors; send refills to [facility] (in [place])    dapagliflozin propanediol (FARXIGA) 5 MG TABS tablet Take 2 tablets (10 mg total) by mouth daily.   docusate sodium (COLACE) 100 MG capsule Take 100 mg by mouth daily as needed for mild constipation.    gabapentin (NEURONTIN) 300 MG capsule TAKE 1 CAPSULE(300 MG) BY MOUTH THREE TIMES DAILY   insulin lispro (HUMALOG KWIKPEN) 100 UNIT/ML KwikPen Take 25 units [name] breakfast and dinner and 35 units at lunch   Insulin Pen Needle (BD PEN NEEDLE MICRO U/F) 32G X 6 MM MISC DX:E11.65 USE TO CHECK BLOOD SUGARS THREE TIMES A DAY   Lancets Misc. MISC 1 each by Does not apply route 4 (four) times daily -  with meals and at bedtime.   lubiprostone (AMITIZA) 8 MCG capsule Take 1 capsule (8 mcg total) by mouth daily with breakfast.   mirabegron ER (  MYRBETRIQ) 50 MG TB24 tablet Take 1 tablet (50 mg total) by mouth daily.   mometasone-formoterol (DULERA) 100-5 MCG/ACT AERO Inhale 2 puffs into the lungs as needed for wheezing.  (Patient not taking: Reported on 05/03/2020)   Multiple Vitamin (MULTIVITAMIN WITH MINERALS) TABS tablet Take 1 tablet by mouth daily.   ondansetron (ZOFRAN) 4 MG tablet Take 1 tablet (4 mg total) by mouth every 6 (six) hours as needed for nausea or vomiting.   pantoprazole (PROTONIX) 40 MG tablet Take 30- 60 min before your first and last meals of the day   pravastatin (PRAVACHOL) 80 MG  tablet Take 0.5 tablets (40 mg total) by mouth daily.   Semaglutide, 1 MG/DOSE, (OZEMPIC, 1 MG/DOSE,) 2 MG/1.5ML SOPN INJECT 1MG  INTO HE SKIN EVERY Thursday   spironolactone (ALDACTONE) 50 MG tablet TAKE ONE TABLET BY MOUTH EVERY MORNING   Vitamin D, Ergocalciferol, (DRISDOL) 1.25 MG (50000 UNIT) CAPS capsule Take 1 capsule (50,000 Units total) by mouth every 7 (seven) days. TAKE 1 CAPSULE BY MOUTH EVERY 7 DAYS ON WEDNESDAYS   [DISCONTINUED] insulin degludec (TRESIBA FLEXTOUCH) 100 UNIT/ML FlexTouch Pen Inject 18 Units into the skin daily.   No facility-administered encounter medications on file as of 06/27/2020.    Current Diagnosis: Patient Active Problem List   Diagnosis Date Noted   S/P lumbar laminectomy 03/30/2020   Localized swelling, mass and lump, multiple sites 03/27/2018   Type II diabetes mellitus with renal manifestations ([place]) 12/27/2017   HLD (hyperlipidemia) 12/27/2017   HTN (hypertension) 12/27/2017   CKD (chronic kidney disease), stage III ([place]) 12/27/2017   Cough variant asthma 12/01/2015   Osteoarthritis of left knee, primary localized 08/17/2014   Knee osteoarthritis 08/17/2014   Hypoglycemia 11/15/2013   Other and unspecified hyperlipidemia 08/06/2013   Type II diabetes mellitus, uncontrolled ([place]) 07/20/2013   Varicose veins of lower extremities with other complications 45/40/9811   Fibromyalgia    GERD 05/18/2010   ABDOMINAL PAIN, GENERALIZED 05/02/2010   OBESITY 04/20/2010   BURSITIS, LEFT SHOULDER 03/09/2010   Lipoma of arm s/p excision 01/29/2014 01/10/2010   DEPRESSION 12/27/2009   BACK PAIN WITH RADICULOPATHY 12/27/2009   CYSTITIS, ACUTE 10/21/2009   MICROSCOPIC HEMATURIA 10/18/2009   KNEE PAIN, BILATERAL 10/18/2009   NEUROPATHY 09/08/2009   Asthma 09/08/2009   STRESS INCONTINENCE 09/08/2009   CHEST PAIN 07/19/2009   ESOPHAGEAL STRICTURE 08/27/2005   Diabetes mellitus without complication ([place]) 91/47/8295      Follow-Up:  Coordination of Enhanced Pharmacy Services and Pharmacist Review  I was able to speak with Dr [name] CMA and she had a sample of [place] for patient to pick up. Refill sent in to [facility] by [place] and delivery was coordinated for her Tresiba on 06/28/2020. Leata Mouse, CPP notified.  [name], [place] Pharmacist Assistant [phone]

## 2020-07-03 DIAGNOSIS — M961 Postlaminectomy syndrome, not elsewhere classified: Secondary | ICD-10-CM | POA: Insufficient documentation

## 2020-07-04 ENCOUNTER — Telehealth: Payer: Self-pay | Admitting: Pharmacist

## 2020-07-04 NOTE — Chronic Care Management (AMB) (Signed)
Chronic Care Management Pharmacy Assistant   Name: [name]  MRN: [id number] DOB: [birth year]  Reason for Encounter: Medication Review  Patient Questions:  1.  Have you seen any other providers since your last visit? Yes, 10/20/2021Glenard Haring Little (CCM RN), 07/01/2020- Dr Lorin Mercy (Orthopedics).   2.  Any changes in your medicines or health? Yes, Prednisone 10 mg added 07/01/20.    PCP : [name], MD  Allergies:   Allergies  Allergen Reactions  . Pollen Extract Other (See Comments)    Congestion/sneezing  . Tomato Hives and Itching  . Codeine Hives, Itching and Nausea And Vomiting    Medications: Outpatient Encounter Medications as of 07/04/2020  Medication Sig  . amLODipine (NORVASC) 10 MG tablet TAKE ONE TABLET BY MOUTH EVERYDAY AT BEDTIME  . Continuous Blood Gluc Receiver (FREESTYLE LIBRE READER) DEVI 1 each by Does not apply route See admin instructions. CGM device-Freestyle Libre  . Continuous Blood Gluc Sensor (FREESTYLE LIBRE 14 DAY SENSOR) MISC 1 each by Does not apply route See admin instructions. CGM 14-day sensors; send refills to [facility] (in [place])   . dapagliflozin propanediol (FARXIGA) 5 MG TABS tablet Take 2 tablets (10 mg total) by mouth daily.  [name] docusate sodium (COLACE) 100 MG capsule Take 100 mg by mouth daily as needed for mild constipation.   . gabapentin (NEURONTIN) 300 MG capsule TAKE 1 CAPSULE(300 MG) BY MOUTH THREE TIMES DAILY  . insulin degludec (TRESIBA FLEXTOUCH) 100 UNIT/ML FlexTouch Pen Inject 18 Units into the skin daily.  . insulin lispro (HUMALOG KWIKPEN) 100 UNIT/ML KwikPen Take 25 units [place] breakfast and dinner and 35 units at lunch  . Insulin Pen Needle (BD PEN NEEDLE MICRO U/F) 32G X 6 MM MISC DX:E11.65 USE TO CHECK BLOOD SUGARS THREE TIMES A DAY  . Lancets Misc. MISC 1 each by Does not apply route 4 (four) times daily -  with meals and at bedtime.  [name] lubiprostone (AMITIZA) 8 MCG capsule Take 1 capsule (8 mcg total) by  mouth daily with breakfast.  . mirabegron ER (MYRBETRIQ) 50 MG TB24 tablet Take 1 tablet (50 mg total) by mouth daily.  . mometasone-formoterol (DULERA) 100-5 MCG/ACT AERO Inhale 2 puffs into the lungs as needed for wheezing.  (Patient not taking: Reported on 05/03/2020)  . Multiple Vitamin (MULTIVITAMIN WITH MINERALS) TABS tablet Take 1 tablet by mouth daily.  . ondansetron (ZOFRAN) 4 MG tablet Take 1 tablet (4 mg total) by mouth every 6 (six) hours as needed for nausea or vomiting.  . pantoprazole (PROTONIX) 40 MG tablet Take 30- 60 min before your first and last meals of the day  . pravastatin (PRAVACHOL) 80 MG tablet Take 0.5 tablets (40 mg total) by mouth daily.  . predniSONE (STERAPRED UNI-PAK 21 TAB) 10 MG (21) TBPK tablet Take 2 tablets the first day then one tablet daily for one week  . Semaglutide, 1 MG/DOSE, (OZEMPIC, 1 MG/DOSE,) 2 MG/1.5ML SOPN INJECT 1MG  INTO HE SKIN EVERY Thursday  . spironolactone (ALDACTONE) 50 MG tablet TAKE ONE TABLET BY MOUTH EVERY MORNING  . Vitamin D, Ergocalciferol, (DRISDOL) 1.25 MG (50000 UNIT) CAPS capsule Take 1 capsule (50,000 Units total) by mouth every 7 (seven) days. TAKE 1 CAPSULE BY MOUTH EVERY 7 DAYS ON WEDNESDAYS   No facility-administered encounter medications on file as of 07/04/2020.    Current Diagnosis: Patient Active Problem List   Diagnosis Date Noted  . Postlaminectomy syndrome, not elsewhere classified 07/03/2020  . S/P lumbar laminectomy 03/30/2020  .  Localized swelling, mass and lump, multiple sites 03/27/2018  . Type II diabetes mellitus with renal manifestations ([place]) 12/27/2017  . HLD (hyperlipidemia) 12/27/2017  . HTN (hypertension) 12/27/2017  . CKD (chronic kidney disease), stage III ([place]) 12/27/2017  . Cough variant asthma 12/01/2015  . Osteoarthritis of left knee, primary localized 08/17/2014  . Knee osteoarthritis 08/17/2014  . Hypoglycemia 11/15/2013  . Other and unspecified hyperlipidemia 08/06/2013  . Type II  diabetes mellitus, uncontrolled ([place]) 07/20/2013  . Varicose veins of lower extremities with other complications 02/77/4128  . Fibromyalgia   . GERD 05/18/2010  . ABDOMINAL PAIN, GENERALIZED 05/02/2010  . OBESITY 04/20/2010  . BURSITIS, LEFT SHOULDER 03/09/2010  . Lipoma of arm s/p excision 01/29/2014 01/10/2010  . DEPRESSION 12/27/2009  . BACK PAIN WITH RADICULOPATHY 12/27/2009  . CYSTITIS, ACUTE 10/21/2009  . MICROSCOPIC HEMATURIA 10/18/2009  . KNEE PAIN, BILATERAL 10/18/2009  . NEUROPATHY 09/08/2009  . Asthma 09/08/2009  . STRESS INCONTINENCE 09/08/2009  . CHEST PAIN 07/19/2009  . ESOPHAGEAL STRICTURE 08/27/2005  . Diabetes mellitus without complication ([place]) 78/67/6720   Reviewed chart for medication changes ahead of medication coordination call.  OVs, Consults- 06/15/2020- Glenard Haring Little (CCM RN), 07/01/2020- Dr Lorin Mercy (Orthopedics) since last care coordination call/Pharmacist visit.  Medication changes indicated: Prednisone 10 mg added 07/01/2020.    BP Readings from Last 3 Encounters:  07/01/20 135/83  05/03/20 140/76  03/30/20 129/81    Lab Results  Component Value Date   HGBA1C 11.4 (H) 05/03/2020     Patient obtains medications through Adherence Packaging  90 Days   Last adherence delivery included:  Ozempic 1 mg - inject 1 mg sq weekly  Novofine pen needles- Use three times daily.  [name] Flextouch- Inject 18 units sq daily.  Patient declined the following medications last month: Women's 50 Plus Multivitamin- 1 tablet daily,Myrbetriq 50 mg- 1 tablet daily,  Spironolactone 50 mg- 1 tablet daily, Amlodipine 10 mg- one tablet daily (bedtime),  Pravastatin 80 mg- 1/2 tablet daily, Amitiza 8 mg- 1 capsule daily with breakfast, Farxiga 10 mg- 1 tablet daily (before breakfast) due to receiving a 90 day supply on 05/12/2020. Ergocalciferol 50,000 units- 1 capsule weekly on Wednesdays due to receiving a 84 day supply on 05/12/2020. Humalog u100- Inject 25 units sq with  breakfast and supper, 35 units with lunch due to receiving a 35 day supply on 05/12/2020.   Patient is due for next adherence delivery on: 07/05/2020. 07/04/2020- Called patient and reviewed medications, determined no medication needed during delivery adherence call.   Patient informed that she recently saw her Orthopedist- Dr Lorin Mercy on 07/01/2020 was given a Prednisone dose treatment for back pains, blood sugars are elevated, recent readings of 200-269, patient states she is taking her diabetes medications. Patient is taking 27-30 units of Humalog at breakfast and 35 units at lunch, she is taking her Tresiba 20 units daily at evening meal. Patient mentioned that her Gabapentin is getting low but she would like to change back to Lyrica, she felt like this med helped her more with her Neuropathy symptoms. Patient also mentioned that her blood pressures were elevated also, recent readings of 165/83, 187/90, 135/70, 165/80, 168/75, 182/92, 202/110, 180/72.  Patient aware that I will really this information to [name], CPP to discuss with PCP.  Patient has an appointment with PCP- [name], FNP on Thursday 07/07/2020. Will determine if new medications will be needed for delivery after visit.   Visit 07/07/2020- [name], FNP (PCP) Gabapentin 300 mg discontinued.  This delivery to include: No Fill needed.  No short fill or acute fill needed.  Patient declined the following medications : Women's 50 Plus Multivitamin- 1 tablet daily,Myrbetriq 50 mg- 1 tablet daily,  Spironolactone 50 mg- 1 tablet daily, Amlodipine 10 mg- one tablet daily (bedtime),  Pravastatin 80 mg- 1/2 tablet daily, Amitiza 8 mg- 1 capsule daily with breakfast, Farxiga 10 mg- 1 tablet daily (before breakfast) due to receiving a 90 day supply on 05/12/2020. Ergocalciferol 50,000 units- 1 capsule weekly on Wednesdays due to receiving a 84 day supply on 05/12/2020. Humalog u100- Inject 25 units sq with breakfast and  supper, 35 units with lunch due to using with a sliding scale, patient has 4 pens left.  No refills needed.   Patient aware that we will follow up with her next month to review medications.  Follow-Up:  Coordination of Enhanced Pharmacy Services and Pharmacist Review  Patient has a follow up visit with Dr Lorin Mercy- Orthopedics on 07/20/2020.  [name], [facility] Pharmacist Assistant [phone]

## 2020-07-07 ENCOUNTER — Other Ambulatory Visit: Payer: Self-pay

## 2020-07-07 ENCOUNTER — Ambulatory Visit (INDEPENDENT_AMBULATORY_CARE_PROVIDER_SITE_OTHER): Payer: Medicare Other | Admitting: Nurse Practitioner

## 2020-07-07 ENCOUNTER — Encounter: Payer: Self-pay | Admitting: Nurse Practitioner

## 2020-07-07 VITALS — BP 136/70 | HR 74 | Temp 97.9°F | Ht 61.8 in | Wt 155.6 lb

## 2020-07-07 DIAGNOSIS — E1142 Type 2 diabetes mellitus with diabetic polyneuropathy: Secondary | ICD-10-CM | POA: Diagnosis not present

## 2020-07-07 DIAGNOSIS — E1165 Type 2 diabetes mellitus with hyperglycemia: Secondary | ICD-10-CM | POA: Diagnosis not present

## 2020-07-07 DIAGNOSIS — I10 Essential (primary) hypertension: Secondary | ICD-10-CM

## 2020-07-07 DIAGNOSIS — Z794 Long term (current) use of insulin: Secondary | ICD-10-CM | POA: Diagnosis not present

## 2020-07-07 DIAGNOSIS — E118 Type 2 diabetes mellitus with unspecified complications: Secondary | ICD-10-CM | POA: Diagnosis not present

## 2020-07-07 NOTE — Patient Instructions (Signed)

## 2020-07-07 NOTE — Progress Notes (Signed)
I,[name] as a Education administrator for [employer], FNP.,have documented all relevant documentation on the behalf of Gwendolyn Brine, FNP,as directed by  Gwendolyn Brine, FNP while in the presence of [name], [name]. This visit occurred during the SARS-CoV-2 public health emergency.  Safety protocols were in place, including screening questions prior to the visit, additional usage of staff PPE, and extensive cleaning of exam room while observing appropriate contact time as indicated for disinfecting solutions.  Subjective:     Patient ID: [name] , female    DOB: [birth year] , 69 y.o.   MRN: [id number]   Chief Complaint  Patient presents with  . Hypertension    HPI  Patient here for a blood pressure follow up after talking with [name] [facility] nurse), blood pressure is up to 180/103, she has had a blood pressure up to 200/102 - was up this high for about a week. She has not been having a headache or dizziness. She uses a blood pressure cuff around her wrist. She denies changing anything in her diet.  She will take her blood pressure when she wakes up and before bed.    She is having pain to her back as well and is scheduled to see Dr. Lorin Mercy.    Hypertension This is a chronic problem. The current episode started 1 to 4 weeks ago (1-2 weeks). The problem is unchanged. The problem is uncontrolled. Pertinent negatives include no anxiety, blurred vision, chest pain, headaches or palpitations. There are no associated agents to hypertension. Risk factors for coronary artery disease include diabetes mellitus and sedentary lifestyle. There are no compliance problems.  There is no history of angina. There is no history of chronic renal disease.  Diabetes She presents for her follow-up diabetic visit. She has type 2 diabetes mellitus. There are no hypoglycemic associated symptoms. Pertinent negatives for hypoglycemia include no headaches. Pertinent negatives for diabetes include no blurred  vision, no chest pain, no polydipsia, no polyphagia and no polyuria. There are no hypoglycemic complications. There are no diabetic complications. Risk factors for coronary artery disease include sedentary lifestyle. Current diabetic treatment includes oral agent (triple therapy). She is compliant with treatment all of the time. She is following a generally unhealthy diet. When asked about meal planning, she reported none. She has not had a previous visit with a dietitian. She rarely participates in exercise. There is no change in her home blood glucose trend. (She is using the freestyle libre) She does not see a podiatrist.Eye exam is current.  Hyperlipidemia This is a chronic problem. The current episode started more than 1 year ago. The problem is controlled. Exacerbating diseases include diabetes. She has no history of chronic renal disease or obesity. Pertinent negatives include no chest pain. The current treatment provides moderate improvement of lipids. There are no compliance problems.  Risk factors for coronary artery disease include diabetes mellitus, dyslipidemia and post-menopausal.     Past Medical History:  Diagnosis Date  . Aortic stenosis    mild on 04/07/19 echo  . Arthritis    L knee, hands, back   . Asthma   . Diabetes mellitus type 2, insulin dependent ([place])   . Fibromyalgia   . Full dentures   . Full dentures   . GERD (gastroesophageal reflux disease)   . H/O hiatal hernia   . Headache    h/o migraines - was followed for a time with a wellness doctor  . History of esophageal dilatation   . History of  rhabdomyolysis    03/ 2015  . Hyperlipidemia   . Hypertension   . Insulin pump in place    since 12/ 2014--  [facility]  . Lumbar disc herniation    right leg weakness  . Osteoarthritis of left knee, primary localized 08/17/2014  . 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  . Renal insufficiency   . Rhabdomyolysis 11/15/2013  . SUI (stress urinary incontinence, female)   . Syncope  12/27/2017  . Wears glasses   . Wears glasses      Family History  Problem Relation Age of Onset  . Diabetes Mother   . Hypertension Mother   . Lung cancer Mother        smoked  . Diabetes Father   . Hypertension Father   . Lung cancer Father        smoked  . Diabetes Brother   . Heart failure Sister   . Diabetes Maternal Grandmother   . Cancer Maternal Grandmother      Current Outpatient Medications:  .  amLODipine (NORVASC) 10 MG tablet, TAKE ONE TABLET BY MOUTH EVERYDAY AT BEDTIME, Disp: 90 tablet, Rfl: 2 .  Continuous Blood Gluc Receiver (FREESTYLE LIBRE READER) DEVI, 1 each by Does not apply route See admin instructions. CGM device-Freestyle Libre, Disp: , Rfl:  .  Continuous Blood Gluc Sensor (FREESTYLE LIBRE 14 DAY SENSOR) MISC, 1 each by Does not apply route See admin instructions. CGM 14-day sensors; send refills to [facility] (in [place]) , Disp: , Rfl:  .  docusate sodium (COLACE) 100 MG capsule, Take 100 mg by mouth daily as needed for mild constipation. , Disp: , Rfl:  .  insulin degludec (TRESIBA FLEXTOUCH) 100 UNIT/ML FlexTouch Pen, Inject 20 Units into the skin daily., Disp: 9 mL, Rfl: 1 .  insulin lispro (HUMALOG KWIKPEN) 100 UNIT/ML KwikPen, Take 25 units [place] breakfast and dinner and 30 units at lunch, Disp: 30 mL, Rfl: 2 .  Insulin Pen Needle (BD PEN NEEDLE MICRO U/F) 32G X 6 MM MISC, DX:E11.65 USE TO CHECK BLOOD SUGARS THREE TIMES A DAY, Disp: 100 each, Rfl: 6 .  Lancets Misc. MISC, 1 each by Does not apply route 4 (four) times daily -  with meals and at bedtime., Disp: 300 each, Rfl: 3 .  lubiprostone (AMITIZA) 8 MCG capsule, Take 1 capsule (8 mcg total) by mouth daily with breakfast., Disp: 90 capsule, Rfl: 1 .  mirabegron ER (MYRBETRIQ) 50 MG TB24 tablet, Take 1 tablet (50 mg total) by mouth daily., Disp: 90 tablet, Rfl: 0 .  mometasone-formoterol (DULERA) 100-5 MCG/ACT AERO, Inhale 2 puffs into the lungs as needed for wheezing. , Disp: , Rfl:  .  Multiple  Vitamin (MULTIVITAMIN WITH MINERALS) TABS tablet, Take 1 tablet by mouth daily., Disp: , Rfl:  .  ondansetron (ZOFRAN) 4 MG tablet, Take 1 tablet (4 mg total) by mouth every 6 (six) hours as needed for nausea or vomiting., Disp: 20 tablet, Rfl: 0 .  pantoprazole (PROTONIX) 40 MG tablet, Take 30- 60 min before your first and last meals of the day, Disp: 60 tablet, Rfl: 11 .  pravastatin (PRAVACHOL) 80 MG tablet, Take 0.5 tablets (40 mg total) by mouth daily., Disp: 90 tablet, Rfl: 2 .  predniSONE (STERAPRED UNI-PAK 21 TAB) 10 MG (21) TBPK tablet, Take 2 tablets the first day then one tablet daily for one week (Patient not taking: Reported on 08/03/2020), Disp: 21 tablet, Rfl: 0 .  spironolactone (ALDACTONE) 50 MG tablet, TAKE ONE TABLET BY MOUTH  EVERY MORNING, Disp: 90 tablet, Rfl: 2 .  FARXIGA 5 MG TABS tablet, TAKE TWO TABLETS BY MOUTH BEFORE BREAKFAST, Disp: 90 tablet, Rfl: 1 .  gabapentin (NEURONTIN) 300 MG capsule, Take 1 capsule (300 mg total) by mouth 2 (two) times daily., Disp: 60 capsule, Rfl: 0 .  HUMALOG 100 UNIT/ML injection, INJECT 25 UNITS into THE SKIN WITH BREAKFAST AND SUPPER AND 35 UNITS WITH LUNCH, Disp: 40 mL, Rfl: 2 .  OZEMPIC, 1 MG/DOSE, 4 MG/3ML SOPN, INJECT 9m into THE SKIN every thursday, Disp: 4.5 mL, Rfl: 1 .  pregabalin (LYRICA) 50 MG capsule, Take 1 capsule (50 mg total) by mouth 3 (three) times daily., Disp: 90 capsule, Rfl: 2 .  Vitamin D, Ergocalciferol, (DRISDOL) 1.25 MG (50000 UNIT) CAPS capsule, Take 1 capsule (50,000 Units total) by mouth every 7 (seven) days. TAKE 1 CAPSULE BY MOUTH EVERY 7 DAYS ON WEDNESDAYS, Disp: 15 capsule, Rfl: 1   Allergies  Allergen Reactions  . Pollen Extract Other (See Comments)    Congestion/sneezing  . Tomato Hives and Itching  . Codeine Hives, Itching and Nausea And Vomiting     Review of Systems  Eyes: Negative for blurred vision.  Cardiovascular: Negative for chest pain and palpitations.  Endocrine: Negative for polydipsia,  polyphagia and polyuria.  Neurological: Negative for headaches.     Today's Vitals   07/07/20 1601  BP: 136/70  Pulse: 74  Temp: 97.9 F (36.6 C)  TempSrc: Oral  Weight: 155 lb 9.6 oz (70.6 kg)  Height: 5' 1.8" (1.57 m)  PainSc: 7   PainLoc: Back   Body mass index is 28.64 kg/m.   Objective:  Physical Exam Vitals reviewed.  Constitutional:      General: She is not in acute distress.    Appearance: Normal appearance.  Cardiovascular:     Rate and Rhythm: Normal rate and regular rhythm.     Pulses: Normal pulses.     Heart sounds: Normal heart sounds. No murmur heard.   Pulmonary:     Effort: Pulmonary effort is normal. No respiratory distress.     Breath sounds: Normal breath sounds. No wheezing.  Neurological:     General: No focal deficit present.     Mental Status: She is alert and oriented to person, place, and time.     Cranial Nerves: No cranial nerve deficit.  Psychiatric:        Mood and Affect: Mood normal.        Behavior: Behavior normal.        Thought Content: Thought content normal.        Judgment: Judgment normal.         Assessment And Plan:     1. Essential hypertension  Blood pressure is better controlled today  Continue with current medications - Hemoglobin A1c - CMP14+EGFR  2. Uncontrolled type 2 diabetes mellitus with hyperglycemia ([place]  She continues with the humalog we are trying to decrease this as she continues with tresiba  She is also been in touch with diabetic educator to get a better handle on her blood sugars - Hemoglobin A1c - insulin degludec (TRESIBA FLEXTOUCH) 100 UNIT/ML FlexTouch Pen; Inject 20 Units into the skin daily.  Dispense: 9 mL; Refill: 1 - insulin lispro (HUMALOG KWIKPEN) 100 UNIT/ML KwikPen; Take 25 units [place] breakfast and dinner and 30 units at lunch  Dispense: 30 mL; Refill: 2  3. Diabetic polyneuropathy associated with type 2 diabetes mellitus (HCC) - pregabalin (LYRICA) 50 MG capsule; Take  1 capsule  (50 mg total) by mouth 3 (three) times daily.  Dispense: 90 capsule; Refill: 2     Patient was given opportunity to ask questions. Patient verbalized understanding of the plan and was able to repeat key elements of the plan. All questions were answered to their satisfaction.  Gwendolyn Brine, FNP   I, Gwendolyn Brine, FNP, have reviewed all documentation for this visit. The documentation on 08/21/20 for the exam, diagnosis, procedures, and orders are all accurate and complete.   THE PATIENT IS ENCOURAGED TO PRACTICE SOCIAL DISTANCING DUE TO THE COVID-19 PANDEMIC.

## 2020-07-08 LAB — CMP14+EGFR
ALT: 17 IU/L (ref 0–32)
AST: 19 IU/L (ref 0–40)
Albumin/Globulin Ratio: 1.5 (ref 1.2–2.2)
Albumin: 4.2 g/dL (ref 3.8–4.8)
Alkaline Phosphatase: 107 IU/L (ref 44–121)
BUN/Creatinine Ratio: 8 — ABNORMAL LOW (ref 12–28)
BUN: 9 mg/dL (ref 8–27)
Bilirubin Total: 0.3 mg/dL (ref 0.0–1.2)
CO2: 26 mmol/L (ref 20–29)
Calcium: 9.5 mg/dL (ref 8.7–10.3)
Chloride: 99 mmol/L (ref 96–106)
Creatinine, Ser: 1.15 mg/dL — ABNORMAL HIGH (ref 0.57–1.00)
GFR calc Af Amer: 57 mL/min/{1.73_m2} — ABNORMAL LOW (ref >59)
GFR calc non Af Amer: 49 mL/min/{1.73_m2} — ABNORMAL LOW (ref >59)
Globulin, Total: 2.8 g/dL (ref 1.5–4.5)
Glucose: 79 mg/dL (ref 65–99)
Potassium: 4.5 mmol/L (ref 3.5–5.2)
Sodium: 143 mmol/L (ref 134–144)
Total Protein: 7 g/dL (ref 6.0–8.5)

## 2020-07-08 LAB — HEMOGLOBIN A1C
Est. average glucose Bld gHb Est-mCnc: 255 mg/dL
Hgb A1c MFr Bld: 10.5 % — ABNORMAL HIGH (ref 4.8–5.6)

## 2020-07-13 ENCOUNTER — Telehealth: Payer: Self-pay | Admitting: Orthopaedic Surgery

## 2020-07-13 DIAGNOSIS — Z9889 Other specified postprocedural states: Secondary | ICD-10-CM

## 2020-07-13 DIAGNOSIS — M961 Postlaminectomy syndrome, not elsewhere classified: Secondary | ICD-10-CM

## 2020-07-13 NOTE — Telephone Encounter (Signed)
Please advise. Would you like order entered for MRI?  It was not done at last visit and not mentioned in the note. Anything you would recommend for the patient's pain?

## 2020-07-13 NOTE — Telephone Encounter (Signed)
Patient called advised the pain in her back and legs is still hurting pretty bad. Patient said she has not gotten any better and is out of the Prednisone. Patient said she ran out 2 days ago. Patient said she has not been set up for an MRI yet and asked if she should still keep the appointment scheduled for 07/20/2020? The number to contact patient is [phone]

## 2020-07-13 NOTE — Telephone Encounter (Signed)
MRI order entered. Is there anything that you would recommend for patient's pain as she has completed the prednisone dosepak?

## 2020-07-13 NOTE — Telephone Encounter (Signed)
OK proceed with MRI with and without contrast. Rule out recurrent disc herniation thanks

## 2020-07-14 MED ORDER — GABAPENTIN 300 MG PO CAPS: 300.0000 mg | ORAL_CAPSULE | Freq: Two times a day (BID) | ORAL | 0 refills | Status: DC

## 2020-07-14 NOTE — Telephone Encounter (Signed)
Neurontin 300mg  po bid # [phone] thanks

## 2020-07-14 NOTE — Telephone Encounter (Signed)
Neurontin called to pharmacy. Patient advised. She is going to need to have return appt for after MRI. We tentatively scheduled for 12/7, however, she has another appt that same day at the same time. I tried to reach patient back to advise but had to leave message. Will try and reach patient again to make follow up appt.

## 2020-07-14 NOTE — Telephone Encounter (Signed)
Patient called [place] office and made appt for 08/03/2020 for MRI review.

## 2020-07-15 ENCOUNTER — Telehealth: Payer: Self-pay

## 2020-07-20 ENCOUNTER — Ambulatory Visit: Payer: Medicare Other | Admitting: Orthopaedic Surgery

## 2020-07-26 ENCOUNTER — Telehealth: Payer: Self-pay | Admitting: *Deleted

## 2020-07-26 NOTE — Chronic Care Management (AMB) (Signed)
  Care Management   Note  07/26/2020 Name: [name] MRN: [id number] DOB: Dec 30, 1950  [name] is a 69 y.o. year old female who is a primary care patient of [name], MD and is actively engaged with the care management team. I reached out to [name] by phone today to assist with re-scheduling a follow up visit with the Pharmacist.  Follow up plan: Unsuccessful telephone outreach attempt made. A HIPAA compliant phone message was left for the patient providing contact information and requesting a return call. The care management team will reach out to the patient again over the next 7 days. If patient returns call to provider office, please advise to call [place] at [facility]

## 2020-07-26 NOTE — Chronic Care Management (AMB) (Signed)
  Care Management   Note  07/26/2020 Name: [name] MRN: [id number] DOB: [birth year]  [name] is a 69 y.o. year old female who is a primary care patient of [name], MD and is actively engaged with the care management team. I reached out to [name] by phone today to assist with re-scheduling a follow up visit with the Pharmacist  Follow up plan: Telephone appointment with care management team member scheduled for:09/08/2020  [facility]

## 2020-07-29 ENCOUNTER — Other Ambulatory Visit: Payer: Self-pay | Admitting: Nurse Practitioner

## 2020-07-29 DIAGNOSIS — E1165 Type 2 diabetes mellitus with hyperglycemia: Secondary | ICD-10-CM

## 2020-07-30 ENCOUNTER — Other Ambulatory Visit: Payer: Self-pay

## 2020-07-30 ENCOUNTER — Ambulatory Visit
Admission: RE | Admit: 2020-07-30 | Discharge: 2020-07-30 | Disposition: A | Discharge status: Home / Self Care | Payer: Medicare Other | Source: Ambulatory Visit | Attending: Orthopaedic Surgery | Admitting: Orthopaedic Surgery

## 2020-07-30 DIAGNOSIS — M47816 Spondylosis without myelopathy or radiculopathy, lumbar region: Secondary | ICD-10-CM | POA: Diagnosis not present

## 2020-07-30 DIAGNOSIS — M961 Postlaminectomy syndrome, not elsewhere classified: Secondary | ICD-10-CM

## 2020-07-30 DIAGNOSIS — Z9889 Other specified postprocedural states: Secondary | ICD-10-CM

## 2020-07-30 DIAGNOSIS — M5127 Other intervertebral disc displacement, lumbosacral region: Secondary | ICD-10-CM | POA: Diagnosis not present

## 2020-07-30 MED ORDER — GADOBENATE DIMEGLUMINE 529 MG/ML IV SOLN
14.0000 mL | Freq: Once | INTRAVENOUS | Status: AC | PRN
  Administered 2020-07-30: 14 mL via INTRAVENOUS

## 2020-07-31 ENCOUNTER — Other Ambulatory Visit: Payer: Self-pay | Admitting: Nurse Practitioner

## 2020-07-31 DIAGNOSIS — E1165 Type 2 diabetes mellitus with hyperglycemia: Secondary | ICD-10-CM

## 2020-08-01 ENCOUNTER — Telehealth: Payer: Self-pay

## 2020-08-01 ENCOUNTER — Other Ambulatory Visit: Payer: Self-pay

## 2020-08-01 MED ORDER — VITAMIN D (ERGOCALCIFEROL) 1.25 MG (50000 UNIT) PO CAPS
50000.0000 [IU] | ORAL_CAPSULE | ORAL | 1 refills | Status: DC
Start: 2020-08-01 — End: 2020-10-24

## 2020-08-01 NOTE — Chronic Care Management (AMB) (Signed)
Chronic Care Management Pharmacy Assistant   Name: [name]  MRN: [id number] DOB: 03/31/1951  Reason for Encounter: Medication Review  Patient Questions:  1.  Have you seen any other providers since your last visit? Yes, 11/11/2021[name], FNP (PCP)  2.  Any changes in your medicines or health? Yes, 07/07/2020- Gabapentin 300 mg was discontinued by PCP, 07/13/2020- Gabapentin 300 mg was added by Dr. Lorin Mercy (Ortho).  PCP : [name], MD  Allergies:   Allergies  Allergen Reactions  . Pollen Extract Other (See Comments)    Congestion/sneezing  . Tomato Hives and Itching  . Codeine Hives, Itching and Nausea And Vomiting    Medications: Outpatient Encounter Medications as of 08/01/2020  Medication Sig  . amLODipine (NORVASC) 10 MG tablet TAKE ONE TABLET BY MOUTH EVERYDAY AT BEDTIME  . Continuous Blood Gluc Receiver (FREESTYLE LIBRE READER) DEVI 1 each by Does not apply route See admin instructions. CGM device-Freestyle Libre  . Continuous Blood Gluc Sensor (FREESTYLE LIBRE 14 DAY SENSOR) MISC 1 each by Does not apply route See admin instructions. CGM 14-day sensors; send refills to [facility] (in [place])   . dapagliflozin propanediol (FARXIGA) 5 MG TABS tablet Take 2 tablets (10 mg total) by mouth daily.  [name] docusate sodium (COLACE) 100 MG capsule Take 100 mg by mouth daily as needed for mild constipation.   . gabapentin (NEURONTIN) 300 MG capsule Take 1 capsule (300 mg total) by mouth 2 (two) times daily.  . insulin degludec (TRESIBA FLEXTOUCH) 100 UNIT/ML FlexTouch Pen Inject 18 Units into the skin daily.  . insulin lispro (HUMALOG KWIKPEN) 100 UNIT/ML KwikPen Take 25 units [place] breakfast and dinner and 35 units at lunch  . Insulin Pen Needle (BD PEN NEEDLE MICRO U/F) 32G X 6 MM MISC DX:E11.65 USE TO CHECK BLOOD SUGARS THREE TIMES A DAY  . Lancets Misc. MISC 1 each by Does not apply route 4 (four) times daily -  with meals and at bedtime.  [name] lubiprostone  (AMITIZA) 8 MCG capsule Take 1 capsule (8 mcg total) by mouth daily with breakfast.  . mirabegron ER (MYRBETRIQ) 50 MG TB24 tablet Take 1 tablet (50 mg total) by mouth daily.  . mometasone-formoterol (DULERA) 100-5 MCG/ACT AERO Inhale 2 puffs into the lungs as needed for wheezing.   . Multiple Vitamin (MULTIVITAMIN WITH MINERALS) TABS tablet Take 1 tablet by mouth daily.  . ondansetron (ZOFRAN) 4 MG tablet Take 1 tablet (4 mg total) by mouth every 6 (six) hours as needed for nausea or vomiting.  [name] OZEMPIC, 1 MG/DOSE, 4 MG/3ML SOPN INJECT 1mg  into THE SKIN every thursday  . pantoprazole (PROTONIX) 40 MG tablet Take 30- 60 min before your first and last meals of the day  . pravastatin (PRAVACHOL) 80 MG tablet Take 0.5 tablets (40 mg total) by mouth daily.  . predniSONE (STERAPRED UNI-PAK 21 TAB) 10 MG (21) TBPK tablet Take 2 tablets the first day then one tablet daily for one week  . spironolactone (ALDACTONE) 50 MG tablet TAKE ONE TABLET BY MOUTH EVERY MORNING  . Vitamin D, Ergocalciferol, (DRISDOL) 1.25 MG (50000 UNIT) CAPS capsule Take 1 capsule (50,000 Units total) by mouth every 7 (seven) days. TAKE 1 CAPSULE BY MOUTH EVERY 7 DAYS ON WEDNESDAYS   No facility-administered encounter medications on file as of 08/01/2020.    Current Diagnosis: Patient Active Problem List   Diagnosis Date Noted  . Postlaminectomy syndrome, not elsewhere classified 07/03/2020  . S/P lumbar laminectomy 03/30/2020  .  Localized swelling, mass and lump, multiple sites 03/27/2018  . Type II diabetes mellitus with renal manifestations ([place]) 12/27/2017  . HLD (hyperlipidemia) 12/27/2017  . HTN (hypertension) 12/27/2017  . CKD (chronic kidney disease), stage III ([place]) 12/27/2017  . Cough variant asthma 12/01/2015  . Osteoarthritis of left knee, primary localized 08/17/2014  . Knee osteoarthritis 08/17/2014  . Hypoglycemia 11/15/2013  . Other and unspecified hyperlipidemia 08/06/2013  . Type II diabetes mellitus,  uncontrolled ([place]) 07/20/2013  . Varicose veins of lower extremities with other complications 31/54/0086  . Fibromyalgia   . GERD 05/18/2010  . ABDOMINAL PAIN, GENERALIZED 05/02/2010  . OBESITY 04/20/2010  . BURSITIS, LEFT SHOULDER 03/09/2010  . Lipoma of arm s/p excision 01/29/2014 01/10/2010  . DEPRESSION 12/27/2009  . BACK PAIN WITH RADICULOPATHY 12/27/2009  . CYSTITIS, ACUTE 10/21/2009  . MICROSCOPIC HEMATURIA 10/18/2009  . KNEE PAIN, BILATERAL 10/18/2009  . NEUROPATHY 09/08/2009  . Asthma 09/08/2009  . STRESS INCONTINENCE 09/08/2009  . CHEST PAIN 07/19/2009  . ESOPHAGEAL STRICTURE 08/27/2005  . Diabetes mellitus without complication ([place]) 76/19/5093   Reviewed chart for medication changes ahead of medication coordination call.  OV- 07/07/2020- [name], FNP (PCP) since last care coordination call/Pharmacist visit.   Medication changes indicated:  07/07/2020- Gabapentin 300 mg was discontinued by PCP, 07/13/2020- Gabapentin 300 mg was added by Dr. Lorin Mercy (Ortho).  BP Readings from Last 3 Encounters:  07/07/20 136/70  07/01/20 135/83  05/03/20 140/76    Lab Results  Component Value Date   HGBA1C 10.5 (H) 07/07/2020     Patient obtains medications through [facility]  90 Days   Last adherence delivery included: Per last adherence call no delivery needed due to receiving a 90 days supply on 05/12/2020.   Patient declined the following medications last month:  Women's 50 Plus Multivitamin- 1 tablet daily Myrbetriq 50 mg- 1 tablet daily Spironolactone 50 mg- 1 tablet daily (breakfast)  Amlodipine 10 mg- one tablet daily (bedtime)  Pravastatin 80 mg- 1/2 tablet daily (bedtime) Amitiza 8 mg- 1 capsule daily (breakfast) Farxiga 10 mg- 1 tablet daily (before breakfast)  due to receiving a 90 day supply on 05/12/2020.  Ergocalciferol 50,000 units- 1 capsule weekly on Wednesdays due to receiving a 84 day supply on 05/12/2020. Humalog u100- Inject 25 units sq with  breakfast and supper, 35 units with lunch due to using with a sliding scale, patient has 4 pens left. Tresiba FlexTouch - Inject 18 units sq daily due to receiving a 50 day supply on 06/28/2020.  Patient is due for next adherence delivery on: 08/09/2020 . Called patient and reviewed medications and coordinated delivery.  This delivery to include: Women's 50 Plus Multivitamin- 1 tablet daily (breakfast) Myrbetriq 50 mg- 1 tablet daily (breakfast) Spironolactone 50 mg- 1 tablet daily (breakfast)  Amlodipine 10 mg- one tablet daily (bedtime)  Pravastatin 80 mg- 1/2 tablet daily (bedtime) Amitiza 8 mg- 1 capsule daily (breakfast) Farxiga 10 mg- 1 tablet daily (before breakfast)  Gabapentin 300 mg- 1 tablet twice daily (vials) Ergocalciferol 50,000 units- 1 capsule weekly on Wednesdays   Patient will need a short fill of Ergocalciferol 50,000 units , prior to adherence delivery.  Coordinated acute fill for  Ergocalciferol 50,000 units to be delivered 08/04/2020.  Patient declined the following medications: Humalog u100- Inject 25 units sq with breakfast and supper, 35 units with lunch due to using with a sliding scale, patient has 2 pens left.  Patient needs refills for Ergocalciferol 50,000 and Gabapentin 300 mg.  Coordinated an  future fill [name] to be delivered on 08/17/2020  Confirmed delivery date of 08/09/2020, advised patient that pharmacy will contact them the morning of delivery.  Follow-Up:  Care Coordination with Outside Provider, Coordination of Enhanced Pharmacy Services and Pharmacist Review  Patient blood sugars are still elevated, patient states she eats the same things, does not try anything new and "likes to eat what she like eat" Patient's blood pressures continued to be elevated, follow up with PCP on arm blood pressure cuff(patient using wrist cuff) Patient also eats canned soups frequently.  Patient was to be called in an arm cuff blood pressure monitor, patient  aware we will follow up with PCP regarding prescription. Patient also mentioned that her insurance is going to change coverage of her Amitiza. They will no longer cover but they will cover Linzess. Patient aware I will send message to pharmacist to relay to PCP and we can get determination if change is appropriate for next year.  Refill request sent to PCP for Ergocalciferol.  08/02/20- Called Dr Lorin Mercy office, Left message with front staff, request sent to have Gabapentin 300 mg refills sent to [facility].  [name], [place] Pharmacist Assistant [phone]

## 2020-08-02 ENCOUNTER — Ambulatory Visit: Payer: Self-pay

## 2020-08-02 ENCOUNTER — Ambulatory Visit: Payer: Medicare Other | Admitting: Nurse Practitioner

## 2020-08-02 ENCOUNTER — Telehealth: Payer: Medicare Other

## 2020-08-02 ENCOUNTER — Telehealth: Payer: Self-pay

## 2020-08-02 ENCOUNTER — Other Ambulatory Visit: Payer: Self-pay

## 2020-08-02 DIAGNOSIS — E785 Hyperlipidemia, unspecified: Secondary | ICD-10-CM

## 2020-08-02 DIAGNOSIS — E1165 Type 2 diabetes mellitus with hyperglycemia: Secondary | ICD-10-CM

## 2020-08-02 DIAGNOSIS — I10 Essential (primary) hypertension: Secondary | ICD-10-CM

## 2020-08-02 MED ORDER — GABAPENTIN 300 MG PO CAPS
300.0000 mg | ORAL_CAPSULE | Freq: Two times a day (BID) | ORAL | 0 refills | Status: DC
Start: 2020-08-02 — End: 2020-08-22

## 2020-08-02 NOTE — Telephone Encounter (Signed)
[name] from [facility] called she is requesting a refill for gabapentin it needs to be sent [facility]. CB:[phone]

## 2020-08-02 NOTE — Telephone Encounter (Signed)
I called and spoke with patient. She feels the gabapentin is helping some. Refill sent to [facility].

## 2020-08-02 NOTE — Telephone Encounter (Signed)
Please advise. FYI-pt has follow up appt with you tomorrow to review MRI. Results are in the chart.

## 2020-08-02 NOTE — Telephone Encounter (Signed)
Ok thanks 

## 2020-08-02 NOTE — Addendum Note (Signed)
Addended by: Meyer Cory on: 08/02/2020 11:03 AM   Modules accepted: Orders

## 2020-08-03 ENCOUNTER — Ambulatory Visit (INDEPENDENT_AMBULATORY_CARE_PROVIDER_SITE_OTHER): Payer: Medicare Other | Admitting: Orthopaedic Surgery

## 2020-08-03 ENCOUNTER — Encounter: Payer: Self-pay | Admitting: Orthopaedic Surgery

## 2020-08-03 VITALS — Ht 61.8 in | Wt 155.0 lb

## 2020-08-03 DIAGNOSIS — Z9889 Other specified postprocedural states: Secondary | ICD-10-CM | POA: Diagnosis not present

## 2020-08-03 DIAGNOSIS — M961 Postlaminectomy syndrome, not elsewhere classified: Secondary | ICD-10-CM

## 2020-08-03 NOTE — Progress Notes (Signed)
Office Visit Note   Patient: [name]           Date of Birth: [birth year]           MRN: [id number] Visit Date: 08/03/2020              Requested by: [name], [address] [place],  [place] [address] PCP: [name], MD   Assessment & Plan: Visit Diagnoses:  1. S/P lumbar laminectomy   2. Postlaminectomy syndrome, not elsewhere classified     Plan: We discussed epidural injection if she does not get better she starts having increased symptoms we will proceed with this.  I plan to recheck her in 6 weeks.  Continue ibuprofen and also continue Neurontin as she has been doing.  We reviewed MRI images before and after surgery.  She does have some disc recurrence but has more room after the decompression surgery.  Principal problem is her foraminal stenosis but she also has some disc protrusion at L5-S1 and as we discussed prior to her surgery fusion can load adjacent or remote levels.  Recheck 6 weeks.  If she gets increased problems and needs an ESI  she will call.  Follow-Up Instructions: Return in about 5 weeks (around 09/07/2020).   Orders:  No orders of the defined types were placed in this encounter.  No orders of the defined types were placed in this encounter.     Procedures: No procedures performed   Clinical Data: No additional findings.   Subjective: Chief Complaint  Patient presents with  . Lower Back - Pain, Follow-up    MRI Lumbar Review    HPI 69 year old female returns she is doing well after surgery on 02/19/2020 for many weeks and then started having little recurrence of pain symptoms.  Since that time it is eased to some degree she sometimes fairly comfortable 3 nights out of the week.  She does some water aerobics and then takes a break she is having more symptoms.  Currently she is a little bit more symptoms on the left than right and MRI scan shows post decompression at L4-5 with some recurrent disc extrusion with  moderate to severe subarticular stenosis worse on the right than the left.  Overall she thinks she is getting better slowly each week.  She is doing her walking when she has increased pain she rest.  Review of Systems   Objective: Vital Signs: Ht 5' 1.8" (1.57 m)   Wt 155 lb (70.3 kg)   LMP  (LMP Unknown)   BMI 28.53 kg/m   Physical Exam Constitutional:      Appearance: She is well-developed.  HENT:     Head: Normocephalic.     Right Ear: External ear normal.     Left Ear: External ear normal.  Eyes:     Pupils: Pupils are equal, round, and reactive to light.  Neck:     Thyroid: No thyromegaly.     Trachea: No tracheal deviation.  Cardiovascular:     Rate and Rhythm: Normal rate.  Pulmonary:     Effort: Pulmonary effort is normal.  Abdominal:     Palpations: Abdomen is soft.  Skin:    General: Skin is warm and dry.  Neurological:     Mental Status: She is alert and oriented to person, place, and time.  Psychiatric:        Behavior: Behavior normal.     Ortho Exam lumbar incision well-healed.  She has  some pain with straight leg raising both right and left.  Ankle dorsiflexion plantarflexion is strong.  Specialty Comments:  No specialty comments available.  Imaging: Contrast Study Result  Narrative & Impression  CLINICAL DATA:  Status post lumbar laminectomy. Post laminectomy syndrome, not elsewhere classified. Low back pain, prior surgery, new symptoms; rule out recurrent disc herniation. Additional history provided by scanning technologist: Patient reports central lumbar and posterior bilateral leg weakness and numbness, symptoms for 1 month.  EXAM: MRI LUMBAR SPINE WITHOUT AND WITH CONTRAST  TECHNIQUE: Multiplanar and multiecho pulse sequences of the lumbar spine were obtained without and with intravenous contrast.  CONTRAST:  55mL MULTIHANCE GADOBENATE DIMEGLUMINE 529 MG/ML IV SOLN  COMPARISON:  Radiographs of the lumbar spine 02/19/2020.  Lumbar spine MRI 02/13/2020.  FINDINGS: Segmentation: For the purposes of this dictation, five lumbar vertebrae are assumed and the caudal most well-formed intervertebral disc is designated L5-S1. Spinal numbering will remain consistent with that utilized on the prior lumbar spine MRI of 02/13/2020.  Alignment:  Trace L4-L5 grade 1 anterolisthesis.  Vertebrae: Vertebral body height is maintained. Mild degenerative edema within the L4 and L5 posterior elements. No significant marrow edema identified elsewhere. No focal suspicious osseous lesion.  Conus medullaris and cauda equina: Conus extends to the L1-L2 level. No signal abnormality within the visualized distal spinal cord.  Paraspinal and other soft tissues: No abnormality identified within included portions of the abdomen/retroperitoneum. Postsurgical changes to the dorsal lumbar soft tissues at L4-L5. Atrophy of the lumbar paraspinal musculature on the right within the lower lumbar spine.  Disc levels:  Unless otherwise stated, the level by level findings below have not significantly changed since prior MRI 02/13/2020.  Moderate disc degeneration at L5-S1. No more than mild disc degeneration at the remaining levels.  T11-T12: Small disc bulge. No significant spinal canal or foraminal stenosis  No significant disc herniation, spinal canal stenosis or neural foraminal narrowing at the T12-L1 through L2-L3 levels.  L3-L4: Small disc bulge. Mild facet arthrosis and ligamentum flavum hypertrophy. No significant spinal canal or foraminal stenosis.  L4-L5: Interval right laminectomy and discectomy. Disc bulge. Residual/recurrent cranially migrated right subarticular/foraminal disc extrusion. Advanced facet arthrosis. Ligamentum flavum hypertrophy. The disc extrusion contributes to moderate/severe right subarticular stenosis with encroachment upon the descending right L5 nerve root (series 9, image 28). It also  contributes to severe right foraminal stenosis with likely impingement of the exiting right L4 nerve root (series 12, image 4). No significant left subarticular or central canal stenosis. Unchanged mild left neural foraminal narrowing. Small right facet joint effusion, new.  L5-S1: Disc bulge with endplate spurring. Superimposed broad-based right center/subarticular disc protrusion. Moderate facet arthrosis with mild ligamentum flavum hypertrophy. As before, the disc protrusion contributes to mild right subarticular narrowing with possible contact upon the descending right S1 nerve root. Central canal patent. Bilateral neural foraminal narrowing (mild right, moderate left).  IMPRESSION: At L4-L5, there has been interval right laminectomy and discectomy. A residual/recurrent cranially migrated right subarticular/foraminal disc extrusion contributes to multifactorial moderate/severe right subarticular stenosis with encroachment upon the descending right L5 nerve root. It also contributes to severe right neural foraminal narrowing with likely impingement of the exiting right L4 nerve root. Unchanged mild left neural foraminal narrowing. A small right facet joint effusion is new from the prior exam.  Lumbar spondylosis is otherwise unchanged as compared to the MRI of 02/13/2020, as detailed above.   Electronically Signed   By: [name] DO   On: 07/31/2020 15:48  PMFS History: Patient Active Problem List   Diagnosis Date Noted  . Postlaminectomy syndrome, not elsewhere classified 07/03/2020  . S/P lumbar laminectomy 03/30/2020  . Localized swelling, mass and lump, multiple sites 03/27/2018  . Type II diabetes mellitus with renal manifestations ([place]) 12/27/2017  . HLD (hyperlipidemia) 12/27/2017  . HTN (hypertension) 12/27/2017  . CKD (chronic kidney disease), stage III ([place]) 12/27/2017  . Cough variant asthma 12/01/2015  . Osteoarthritis of left knee, primary  localized 08/17/2014  . Knee osteoarthritis 08/17/2014  . Hypoglycemia 11/15/2013  . Other and unspecified hyperlipidemia 08/06/2013  . Type II diabetes mellitus, uncontrolled ([place]) 07/20/2013  . Varicose veins of lower extremities with other complications 34/74/2595  . Fibromyalgia   . GERD 05/18/2010  . ABDOMINAL PAIN, GENERALIZED 05/02/2010  . OBESITY 04/20/2010  . BURSITIS, LEFT SHOULDER 03/09/2010  . Lipoma of arm s/p excision 01/29/2014 01/10/2010  . DEPRESSION 12/27/2009  . BACK PAIN WITH RADICULOPATHY 12/27/2009  . CYSTITIS, ACUTE 10/21/2009  . MICROSCOPIC HEMATURIA 10/18/2009  . KNEE PAIN, BILATERAL 10/18/2009  . NEUROPATHY 09/08/2009  . Asthma 09/08/2009  . STRESS INCONTINENCE 09/08/2009  . CHEST PAIN 07/19/2009  . ESOPHAGEAL STRICTURE 08/27/2005  . Diabetes mellitus without complication ([place]) 63/87/5643   Past Medical History:  Diagnosis Date  . Aortic stenosis    mild on 04/07/19 echo  . Arthritis    L knee, hands, back   . Asthma   . Diabetes mellitus type 2, insulin dependent ([place])   . Fibromyalgia   . Full dentures   . Full dentures   . GERD (gastroesophageal reflux disease)   . H/O hiatal hernia   . Headache    h/o migraines - was followed for a time with a wellness doctor  . History of esophageal dilatation   . History of rhabdomyolysis    03/ 2015  . Hyperlipidemia   . Hypertension   . Insulin pump in place    since 12/ 2014--  [employer]  . Lumbar disc herniation    right leg weakness  . Osteoarthritis of left knee, primary localized 08/17/2014  . 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  . Renal insufficiency   . Rhabdomyolysis 11/15/2013  . SUI (stress urinary incontinence, female)   . Syncope 12/27/2017  . Wears glasses   . Wears glasses     Family History  Problem Relation Age of Onset  . Diabetes Mother   . Hypertension Mother   . Lung cancer Mother        smoked  . Diabetes Father   . Hypertension Father   . Lung cancer Father        smoked  .  Diabetes Brother   . Heart failure Sister   . Diabetes Maternal Grandmother   . Cancer Maternal Grandmother     Past Surgical History:  Procedure Laterality Date  . BLADDER SUSPENSION N/A 03/09/2014   Procedure: CYSTOSCOPY/SLING;  Surgeon: Reece Packer, MD;  Location: [facility];  Service: Urology;  Laterality: N/A;  . CARDIAC CATHETERIZATION  07-20-2009   DR Shelva Majestic   MODERATE LVH/  NORMAL  LVEF/  NORMAL CORONARY AND RENAL ARTERIES  . CARPAL TUNNEL RELEASE Right 2000  . CHOLECYSTECTOMY  1990  . COLONOSCOPY WITH ESOPHAGOGASTRODUODENOSCOPY (EGD)  06-18-2002  . ESOPHAGOGASTRODUODENOSCOPY (EGD) WITH ESOPHAGEAL DILATION  06-12-2010  . EXCISION LEFT UPPER ARM MASS  01-29-2014  . EYE SURGERY  2010   laser on R eye  . INTERSTIM IMPLANT REMOVAL N/A 03/06/2019   Procedure: REMOVAL OF Barrie Lyme  IMPLANT;  Surgeon: Bjorn Loser, MD;  Location: [facility];  Service: Urology;  Laterality: N/A;  . KNEE ARTHROSCOPY Right 1989  &  2002  . LUMBAR LAMINECTOMY N/A 02/19/2020   Procedure: right L4 hemiaminectomy, microdiscectomy;  Surgeon: Marybelle Killings, MD;  Location: [facility];  Service: Orthopedics;  Laterality: N/A;  . MULTIPLE TOOTH EXTRACTIONS    . NEGATIVE SLEEP STUDY  2012   PER PT  . PARTIAL KNEE ARTHROPLASTY Left 08/17/2014   Procedure: LEFT UNICOMPARTMENTAL KNEE;  Surgeon: Johnny Bridge, MD;  Location: [facility];  Service: Orthopedics;  Laterality: Left;   Social History   Occupational History  . Occupation: disability  Tobacco Use  . Smoking status: Never Smoker  . Smokeless tobacco: Never Used  Vaping Use  . Vaping Use: Never used  Substance and Sexual Activity  . Alcohol use: No  . Drug use: No  . Sexual activity: Not Currently

## 2020-08-04 ENCOUNTER — Telehealth: Payer: Medicare Other

## 2020-08-06 DIAGNOSIS — Z794 Long term (current) use of insulin: Secondary | ICD-10-CM | POA: Diagnosis not present

## 2020-08-06 DIAGNOSIS — E118 Type 2 diabetes mellitus with unspecified complications: Secondary | ICD-10-CM | POA: Diagnosis not present

## 2020-08-08 ENCOUNTER — Other Ambulatory Visit: Payer: Self-pay | Admitting: Nurse Practitioner

## 2020-08-08 NOTE — Chronic Care Management (AMB) (Signed)
Chronic Care Management   Follow Up Note   08/02/2020 Name: [name] MRN: [id number] DOB: 1951/05/25  Referred by: [name], MD Reason for referral : Chronic Care Management (RNCM FU Call )   [name] is a 69 y.o. year old female who is a primary care patient of [name], MD. The CCM team was consulted for assistance with chronic disease management and care coordination needs.    Review of patient status, including review of consultants reports, relevant laboratory and other test results, and collaboration with appropriate care team members and the patient's provider was performed as part of comprehensive patient evaluation and provision of chronic care management services.    SDOH (Social Determinants of Health) assessments performed: Yes See Care Plan activities for detailed interventions related to [place])   Placed outbound CCM RN CM follow up call to patient for a care plan update.     Outpatient Encounter Medications as of 08/02/2020  Medication Sig  . amLODipine (NORVASC) 10 MG tablet TAKE ONE TABLET BY MOUTH EVERYDAY AT BEDTIME  . Continuous Blood Gluc Receiver (FREESTYLE LIBRE READER) DEVI 1 each by Does not apply route See admin instructions. CGM device-Freestyle Libre  . Continuous Blood Gluc Sensor (FREESTYLE LIBRE 14 DAY SENSOR) MISC 1 each by Does not apply route See admin instructions. CGM 14-day sensors; send refills to [facility] (in [place])   . docusate sodium (COLACE) 100 MG capsule Take 100 mg by mouth daily as needed for mild constipation.   [name] FARXIGA 5 MG TABS tablet TAKE TWO TABLETS BY MOUTH BEFORE BREAKFAST  . gabapentin (NEURONTIN) 300 MG capsule Take 1 capsule (300 mg total) by mouth 2 (two) times daily.  . insulin degludec (TRESIBA FLEXTOUCH) 100 UNIT/ML FlexTouch Pen Inject 18 Units into the skin daily.  . insulin lispro (HUMALOG KWIKPEN) 100 UNIT/ML KwikPen Take 25 units [place] breakfast and dinner and 35 units at lunch  .  Insulin Pen Needle (BD PEN NEEDLE MICRO U/F) 32G X 6 MM MISC DX:E11.65 USE TO CHECK BLOOD SUGARS THREE TIMES A DAY  . Lancets Misc. MISC 1 each by Does not apply route 4 (four) times daily -  with meals and at bedtime.  [name] lubiprostone (AMITIZA) 8 MCG capsule Take 1 capsule (8 mcg total) by mouth daily with breakfast.  . mirabegron ER (MYRBETRIQ) 50 MG TB24 tablet Take 1 tablet (50 mg total) by mouth daily.  . mometasone-formoterol (DULERA) 100-5 MCG/ACT AERO Inhale 2 puffs into the lungs as needed for wheezing.   . Multiple Vitamin (MULTIVITAMIN WITH MINERALS) TABS tablet Take 1 tablet by mouth daily.  . ondansetron (ZOFRAN) 4 MG tablet Take 1 tablet (4 mg total) by mouth every 6 (six) hours as needed for nausea or vomiting.  [name] OZEMPIC, 1 MG/DOSE, 4 MG/3ML SOPN INJECT 1mg  into THE SKIN every thursday  . pantoprazole (PROTONIX) 40 MG tablet Take 30- 60 min before your first and last meals of the day  . pravastatin (PRAVACHOL) 80 MG tablet Take 0.5 tablets (40 mg total) by mouth daily.  . predniSONE (STERAPRED UNI-PAK 21 TAB) 10 MG (21) TBPK tablet Take 2 tablets the first day then one tablet daily for one week (Patient not taking: Reported on 08/03/2020)  . spironolactone (ALDACTONE) 50 MG tablet TAKE ONE TABLET BY MOUTH EVERY MORNING  . Vitamin D, Ergocalciferol, (DRISDOL) 1.25 MG (50000 UNIT) CAPS capsule Take 1 capsule (50,000 Units total) by mouth every 7 (seven) days. TAKE 1 CAPSULE BY MOUTH EVERY 7  DAYS ON WEDNESDAYS   No facility-administered encounter medications on file as of 08/02/2020.     Objective:  Lab Results  Component Value Date   HGBA1C 10.5 (H) 07/07/2020   HGBA1C 11.4 (H) 05/03/2020   HGBA1C 5.9 (H) 12/31/2019   Lab Results  Component Value Date   MICROALBUR 80 12/31/2019   LDLCALC 98 12/31/2019   CREATININE 1.15 (H) 07/07/2020   BP Readings from Last 3 Encounters:  07/07/20 136/70  07/01/20 135/83  05/03/20 140/76    Goals Addressed      Patient Care Plan:  Manage low back pain    Problem Identified: Acute Low Back Pain     Problem Identified: Adjustment to Low Back Pain     Problem Identified: Chronic Low Back Pain     Goal: Chronic Low Back Pain Managed   Start Date: 08/02/2020  Expected End Date: 10/31/2020  This Visit's Progress: On track  Priority: High  Note:   Current Barriers:   Ineffective Self Health Maintenance  Currently UNABLE TO independently self manage needs related to chronic health conditions.   Knowledge Deficits related to short term plan for care coordination needs and long term plans for chronic disease management needs Nurse Case Manager Clinical Goal(s):   Over the next 90 days, patient will work with care management team to address care coordination and chronic disease management needs related to Disease Management  Educational Needs  Care Coordination  Medication Management and Education  Psychosocial Support   Interventions:   Encourage use of multimodal interventions, such as physical therapy, cognitive behavior therapy, massage, distraction, yoga, relaxation, chiropractic manipulation, herbal supplement, acupuncture, application of heat or cold.   Encourage use of local anesthetic or analgesic therapy as an adjunct for pain control, including capsaicin cream, topical nonsteroidal anti-inflammatory drug.   Evaluate pain level, efficacy, tolerability and adherence to pain management plan.   Manage medication-induced effects, such as constipation, nausea, urinary retention, somnolence and dizziness.   Determine if pain is associated with mobility or at rest, location, intensity, frequency, duration, recurrence, pattern and description (e.g., cramping, burning, aching), triggers and relieving factors.   Provide follow-up and monitoring to acknowledge chronicity and manageability of pain.   Patient Goals/Self Care/Activities:  - call for medicine refill 2 or 3 days before it runs out  - plan exercise  or activity when pain is best controlled  - prioritize tasks for the day  - stay active  - track times pain is worst and when it is best  - track what makes the pain worse and what makes it better  - work slower and less intense when having pain   Follow Up Plan: Telephone follow up appointment with care management team member scheduled for:  10/11/20   Problem Identified: Functional Decline     Goal: Maintain Mobility and Function   Start Date: 08/02/2020  Expected End Date: 10/31/2020  This Visit's Progress: On track  Priority: High  Note:   Current Barriers:   Ineffective Self Health Maintenance  Currently UNABLE TO independently self manage needs related to chronic health conditions.   Knowledge Deficits related to short term plan for care coordination needs and long term plans for chronic disease management needs Nurse Case Manager Clinical Goal(s):   Over the next 90 days, patient will work with care management team to address care coordination and chronic disease management needs related to Disease Management  Educational Needs  Care Coordination  Medication Management and Education  Psychosocial Support  Interventions:   Emphasize the importance of physical activity and aerobic exercise as included in treatment plan; assess barriers to adherence; consider patient's abilities and preferences.   Encourage gradual increase in activity or exercise instead of stopping if pain occurs.   Reinforce individual therapy exercise prescription, such as strengthening, stabilization and stretching programs.   Promote optimal body mechanics to stabilize the spine with lifting and functional activity.   Encourage activity and mobility modifications to facilitate optimal function, such as using a log roll for bed mobility or dressing from a seated position.   Reinforce individual adaptive equipment recommendations to limit excessive spinal movements, such as a Public house manager.   Patient Goals/Self Care/Activities: - plan exercise or activity when pain is best controlled - prioritize tasks for the day - stay active - check with my doctor before adding more exercise than I usually do - choose a type of activity I enjoy  - work slower and less intense when having pain   Follow Up Plan: Telephone follow up appointment with care management team member scheduled for: 10/11/20   Patient Care Plan: Manage Diabetes Type 2 (Adult)    Problem Identified: Glycemic Management (Diabetes, Type 2)     Goal: Glycemic Management Optimized   Start Date: 08/02/2020  This Visit's Progress: On track  Priority: High  Note:   Objective:  Lab Results  Component Value Date   HGBA1C 10.5 (H) 07/07/2020 .   Lab Results  Component Value Date   CREATININE 1.15 (H) 07/07/2020   CREATININE 1.24 (H) 05/03/2020   CREATININE 1.21 (H) 02/19/2020   Current Barriers:  [name] Knowledge Deficits related to basic Diabetes pathophysiology and self care/management  Case Manager Clinical Goal(s):  [name] Over the next 90 days, patient will demonstrate improved adherence to prescribed treatment plan for diabetes self care/management as evidenced by:  . daily monitoring and recording of CBG  . adherence to ADA/ carb modified diet . exercise 5 days/week . adherence to prescribed medication regimen Interventions:  . Provided education to patient about basic DM disease process . Reviewed medications with patient and discussed importance of medication adherence . Discussed plans with patient for ongoing care management follow up and provided patient with direct contact information for care management team . Advised patient, providing education and rationale, to check cbg 2-4 and record, calling the CCM team and or PCP for findings outside established parameters.    Anticipate A1C testing (point-of-care) every 3 to 6 months based on goal attainment.   Review mutually-set A1C goal or target  range  Anticipate use of antihyperglycemic with or without insulin and periodic adjustments; consider active involvement of pharmacist.   Compare self-reported symptoms of hypo or hyperglycemia to blood glucose levels, diet and fluid intake, current medications, psychosocial and physiologic stressors, change in activity and barriers to care adherence.   Promote self-monitoring of blood glucose levels.   Assess and address barriers to management plan, such as food insecurity, age, developmental ability, depression, anxiety, fear of hypoglycemia or weight gain, as well as medication cost, side effects and complicated regimen.   Encourage regular dental care for treatment of periodontal disease; refer to dental provider when needed.   Patient Goals/Self-Care Activities . Over the next 90 days, patient will:  - Self administers oral medications as prescribed Self administers insulin as prescribed Self administers injectable DM medication (Ozempic) as prescribed Attends all scheduled provider appointments Checks blood sugars as prescribed and utilize hyper and hypoglycemia protocol as needed Adheres to  prescribed ADA/carb modified  Follow Up Plan: Telephone follow up appointment with care management team member scheduled for: 10/11/20   Problem Identified: Disease Progression (Diabetes, Type 2)     Goal: Disease Progression Prevented or Minimized   Start Date: 08/02/2020  This Visit's Progress: On track  Priority: High  Note:   Objective:  Lab Results  Component Value Date   HGBA1C 10.5 (H) 07/07/2020 .   Lab Results  Component Value Date   CREATININE 1.15 (H) 07/07/2020   CREATININE 1.24 (H) 05/03/2020   CREATININE 1.21 (H) 02/19/2020   Current Barriers:  [name] Knowledge Deficits related to basic Diabetes pathophysiology and self care/management Case Manager Clinical Goal(s):  [name] Over the next 90 days, patient will demonstrate improved adherence to prescribed treatment plan for  diabetes self care/management as evidenced by:  . daily monitoring and recording of CBG  . adherence to ADA/ carb modified diet . exercise 5 days/week . adherence to prescribed medication regimen Interventions:  . Provided education to patient about basic DM disease process . Reviewed medications with patient and discussed importance of medication adherence . Discussed plans with patient for ongoing care management follow up and provided patient with direct contact information for care management team  Ensure completion of annual comprehensive foot exam and dilated eye exam.    Implement additional individualized goals and interventions based on identified risk factors.   Encourage lifestyle changes, such as increased intake of plant-based foods, stress reduction, consistent physical activity and smoking cessation to prevent long-term complications and chronic disease.    Individualize activity and exercise recommendations while considering potential limitations, such as neuropathy, retinopathy or the ability to prevent hyperglycemia or hypoglycemia.    Assess signs/symptoms and risk factors for hypertension, sleep-disordered breathing, neuropathy (including changes in gait and balance), retinopathy, nephropathy and sexual dysfunction.   Patient Goals/Self-Care Activities . Over the next  days, patient will:  - Self administers oral medications as prescribed Self administers insulin as prescribed Self administers injectable DM medication (Ozempic) as prescribed Attends all scheduled provider appointments Checks blood sugars as prescribed and utilize hyper and hypoglycemia protocol as needed Adheres to prescribed ADA/carb modified  Follow Up Plan: Telephone follow up appointment with care management team member scheduled for: 10/11/20   Plan:   Telephone follow up appointment with care management team member scheduled for: 10/11/20  [name], RN, BSN, CCM Care Management  Coordinator [place] [facility]  Direct Phone: [phone]

## 2020-08-08 NOTE — Patient Instructions (Signed)
Visit Information  Goals Addressed    .  Keep Low Back Pain Under Control        Timeframe:  Long-Range Goal Priority:  High Start Date: 08/02/20                            Expected End Date:  10/31/20                     Follow Up Date 10/11/20   - call for medicine refill 2 or 3 days before it runs out - plan exercise or activity when pain is best controlled - prioritize tasks for the day - stay active - track times pain is worst and when it is best - track what makes the pain worse and what makes it better - work slower and less intense when having pain    Why is this important?    Day-to-day life can be hard when you have back pain.   Pain medicine is just one piece of the treatment puzzle. There are many things you can do to manage pain and keep your back strong.    Lifestyle changes, like stopping smoking and eating foods with Vitamin D and calcium, keep your bones and muscles healthy. Your back is better when it is supported by strong muscles.   You can try these action steps to help you manage your pain.     Notes:     [name]  Monitor and Manage My Blood Sugar-Diabetes Type 2        Timeframe:  Long-Range Goal Priority:  High Start Date: 08/02/20                            Expected End Date: 10/31/20                      Follow Up Date 10/11/20    - check blood sugar at prescribed times - check blood sugar before and after exercise - check blood sugar if I feel it is too high or too low - enter blood sugar readings and medication or insulin into daily log - take the blood sugar log to all doctor visits - take the blood sugar meter to all doctor visits    Why is this important?    Checking your blood sugar at home helps to keep it from getting very high or very low.   Writing the results in a diary or log helps the doctor know how to care for you.   Your blood sugar log should have the time, date and the results.   Also, write down the amount of insulin or other  medicine that you take.   Other information, like what you ate, exercise done and how you were feeling, will also be helpful.     Notes:     .  Obtain Eye Exam-Diabetes Type 2        Follow Up Date 10/31/20    - schedule appointment with eye doctor    Why is this important?    Eye check-ups are important when you have diabetes.   Vision loss can be prevented.    Notes:     .  Perform Foot Care-Diabetes Type 2        Timeframe:  Short-Term Goal Priority:  High Start Date:  08/02/20  Expected End Date: 10/12/19                       Follow Up Date 10/12/19   - check feet daily for cuts, sores or redness - do heel pump exercise 2 to 3 times each day - keep feet up while sitting - trim toenails straight across - wash and dry feet carefully every day - wear comfortable, cotton socks - wear comfortable, well-fitting shoes    Why is this important?    Good foot care is very important when you have diabetes.   There are many things you can do to keep your feet healthy and catch a problem early.    Notes:     .  Set My Target A1C-Diabetes Type 2        Timeframe:  Long-Range Goal Priority:  High Start Date:  08/02/20                           Expected End Date: 10/11/20                      Follow Up Date 10/11/20   - set target A1C    Why is this important?    Your target A1C is decided together by you and your doctor.   It is based on several things like your age and other health issues.    Notes:     .  Stay Active and Independent-Low Back Pain        Timeframe:  Long-Range Goal Priority:  High Start Date: 08/02/20                            Expected End Date: 10/31/20                      Follow Up Date 10/11/20  - plan exercise or activity when pain is best controlled - prioritize tasks for the day - stay active - check with my doctor before adding more exercise than I usually do - choose a type of activity I enjoy  - work slower  and less intense when having pain    Why is this important?    Regular activity or exercise is important to managing back pain.   Activity helps to keep your muscles strong.   You will sleep better and feel more relaxed.   You will have more energy and feel less stressed.   If you are not active now, start slowly. Rhyleigh Grassel changes make a big difference.   Rest, but not too much.   Stay as active as you can and listen to your body's signals.     Notes:        The patient verbalized understanding of instructions, educational materials, and care plan provided today and declined offer to receive copy of patient instructions, educational materials, and care plan.   Telephone follow up appointment with care management team member scheduled for: 10/11/20  [name], RN

## 2020-08-19 ENCOUNTER — Other Ambulatory Visit: Payer: Self-pay | Admitting: Orthopaedic Surgery

## 2020-08-21 ENCOUNTER — Encounter: Payer: Self-pay | Admitting: Nurse Practitioner

## 2020-08-21 MED ORDER — INSULIN LISPRO (1 UNIT DIAL) 100 UNIT/ML (KWIKPEN): PEN_INJECTOR | SUBCUTANEOUS | 2 refills | Status: DC

## 2020-08-21 MED ORDER — TRESIBA FLEXTOUCH 100 UNIT/ML ~~LOC~~ SOPN: 20.0000 [IU] | PEN_INJECTOR | Freq: Every day | SUBCUTANEOUS | 1 refills | Status: DC

## 2020-08-21 MED ORDER — PREGABALIN 50 MG PO CAPS: 50.0000 mg | ORAL_CAPSULE | Freq: Three times a day (TID) | ORAL | 2 refills | Status: DC

## 2020-08-22 NOTE — Telephone Encounter (Signed)
Can you please advise since Dr. Yates is out of the office? 

## 2020-08-24 ENCOUNTER — Telehealth: Payer: Self-pay

## 2020-08-24 NOTE — Chronic Care Management (AMB) (Signed)
Chronic Care Management Pharmacy Assistant   Name: [name]  MRN: [id number] DOB: [birth year]  Reason for Encounter: Medication Review   PCP : Dorothyann Peng, MD  Allergies:   Allergies  Allergen Reactions  . Pollen Extract Other (See Comments)    Congestion/sneezing  . Tomato Hives and Itching  . Codeine Hives, Itching and Nausea And Vomiting    Medications: Outpatient Encounter Medications as of 08/24/2020  Medication Sig  . gabapentin (NEURONTIN) 300 MG capsule TAKE ONE CAPSULE BY MOUTH TWICE DAILY  . amLODipine (NORVASC) 10 MG tablet TAKE ONE TABLET BY MOUTH EVERYDAY AT BEDTIME  . Continuous Blood Gluc Receiver (FREESTYLE LIBRE READER) DEVI 1 each by Does not apply route See admin instructions. CGM device-Freestyle Libre  . Continuous Blood Gluc Sensor (FREESTYLE LIBRE 14 DAY SENSOR) MISC 1 each by Does not apply route See admin instructions. CGM 14-day sensors; send refills to [facility] (in [place])   . docusate sodium (COLACE) 100 MG capsule Take 100 mg by mouth daily as needed for mild constipation.   [name] FARXIGA 5 MG TABS tablet TAKE TWO TABLETS BY MOUTH BEFORE BREAKFAST  . HUMALOG 100 UNIT/ML injection INJECT 25 UNITS into THE SKIN WITH BREAKFAST AND SUPPER AND 35 UNITS WITH LUNCH  . insulin degludec (TRESIBA FLEXTOUCH) 100 UNIT/ML FlexTouch Pen Inject 20 Units into the skin daily.  . insulin lispro (HUMALOG KWIKPEN) 100 UNIT/ML KwikPen Take 25 units [place] breakfast and dinner and 30 units at lunch  . Insulin Pen Needle (BD PEN NEEDLE MICRO U/F) 32G X 6 MM MISC DX:E11.65 USE TO CHECK BLOOD SUGARS THREE TIMES A DAY  . Lancets Misc. MISC 1 each by Does not apply route 4 (four) times daily -  with meals and at bedtime.  [name] lubiprostone (AMITIZA) 8 MCG capsule Take 1 capsule (8 mcg total) by mouth daily with breakfast.  . mirabegron ER (MYRBETRIQ) 50 MG TB24 tablet Take 1 tablet (50 mg total) by mouth daily.  . mometasone-formoterol (DULERA) 100-5 MCG/ACT AERO  Inhale 2 puffs into the lungs as needed for wheezing.   . Multiple Vitamin (MULTIVITAMIN WITH MINERALS) TABS tablet Take 1 tablet by mouth daily.  . ondansetron (ZOFRAN) 4 MG tablet Take 1 tablet (4 mg total) by mouth every 6 (six) hours as needed for nausea or vomiting.  [name] OZEMPIC, 1 MG/DOSE, 4 MG/3ML SOPN INJECT 1mg  into THE SKIN every thursday  . pantoprazole (PROTONIX) 40 MG tablet Take 30- 60 min before your first and last meals of the day  . pravastatin (PRAVACHOL) 80 MG tablet Take 0.5 tablets (40 mg total) by mouth daily.  . predniSONE (STERAPRED UNI-PAK 21 TAB) 10 MG (21) TBPK tablet Take 2 tablets the first day then one tablet daily for one week (Patient not taking: Reported on 08/03/2020)  . pregabalin (LYRICA) 50 MG capsule Take 1 capsule (50 mg total) by mouth 3 (three) times daily.  14/03/2020 spironolactone (ALDACTONE) 50 MG tablet TAKE ONE TABLET BY MOUTH EVERY MORNING  . Vitamin D, Ergocalciferol, (DRISDOL) 1.25 MG (50000 UNIT) CAPS capsule Take 1 capsule (50,000 Units total) by mouth every 7 (seven) days. TAKE 1 CAPSULE BY MOUTH EVERY 7 DAYS ON WEDNESDAYS   No facility-administered encounter medications on file as of 08/24/2020.    Current Diagnosis: Patient Active Problem List   Diagnosis Date Noted  . Postlaminectomy syndrome, not elsewhere classified 07/03/2020  . S/P lumbar laminectomy 03/30/2020  . Localized swelling, mass and lump, multiple sites 03/27/2018  . Type  II diabetes mellitus with renal manifestations ([place]) 12/27/2017  . HLD (hyperlipidemia) 12/27/2017  . HTN (hypertension) 12/27/2017  . CKD (chronic kidney disease), stage III ([place]) 12/27/2017  . Cough variant asthma 12/01/2015  . Osteoarthritis of left knee, primary localized 08/17/2014  . Knee osteoarthritis 08/17/2014  . Hypoglycemia 11/15/2013  . Other and unspecified hyperlipidemia 08/06/2013  . Type II diabetes mellitus, uncontrolled ([place]) 07/20/2013  . Varicose veins of lower extremities with other  complications [id number]  . Fibromyalgia   . GERD 05/18/2010  . ABDOMINAL PAIN, GENERALIZED 05/02/2010  . OBESITY 04/20/2010  . BURSITIS, LEFT SHOULDER 03/09/2010  . Lipoma of arm s/p excision 01/29/2014 01/10/2010  . DEPRESSION 12/27/2009  . BACK PAIN WITH RADICULOPATHY 12/27/2009  . CYSTITIS, ACUTE 10/21/2009  . MICROSCOPIC HEMATURIA 10/18/2009  . KNEE PAIN, BILATERAL 10/18/2009  . NEUROPATHY 09/08/2009  . Asthma 09/08/2009  . STRESS INCONTINENCE 09/08/2009  . CHEST PAIN 07/19/2009  . ESOPHAGEAL STRICTURE 08/27/2005  . Diabetes mellitus without complication ([place]) [id number]      Follow-Up:  Pharmacist Review Reviewed chart and adherence measures . Per Insurance data medication adherence for cholesterol 90-99% med compliance .  [name] , CPP notified  [name], [place] Pharmacist Assistant [phone]

## 2020-08-29 ENCOUNTER — Telehealth: Payer: Self-pay

## 2020-08-29 NOTE — Chronic Care Management (AMB) (Signed)
Chronic Care Management Pharmacy Assistant   Name: [name]  MRN: [id number] DOB: 1950-09-21  Reason for Encounter: Medication Review  Patient Questions:  1.  Have you seen any other providers since your last visit? Yes, 12/07/2021Glenard Haring Little (CCM Nurse), 08/03/2020- Dr Lorin Mercy (Orthopedics)  2.  Any changes in your medicines or health? Yes, 08/08/2020- Humalog 100 u/ml dosage change to 25 units breakfast and dinner and 30 units at lunch(PCP).    PCP : [name], MD  Allergies:   Allergies  Allergen Reactions   Pollen Extract Other (See Comments)    Congestion/sneezing   Tomato Hives and Itching   Codeine Hives, Itching and Nausea And Vomiting    Medications: Outpatient Encounter Medications as of 08/29/2020  Medication Sig   gabapentin (NEURONTIN) 300 MG capsule TAKE ONE CAPSULE BY MOUTH TWICE DAILY   amLODipine (NORVASC) 10 MG tablet TAKE ONE TABLET BY MOUTH EVERYDAY AT BEDTIME   Continuous Blood Gluc Receiver (FREESTYLE LIBRE READER) DEVI 1 each by Does not apply route See admin instructions. CGM device-Freestyle Libre   Continuous Blood Gluc Sensor (FREESTYLE LIBRE 14 DAY SENSOR) MISC 1 each by Does not apply route See admin instructions. CGM 14-day sensors; send refills to [facility] (in Epic)    docusate sodium (COLACE) 100 MG capsule Take 100 mg by mouth daily as needed for mild constipation.    FARXIGA 5 MG TABS tablet TAKE TWO TABLETS BY MOUTH BEFORE BREAKFAST   HUMALOG 100 UNIT/ML injection INJECT 25 UNITS into THE SKIN WITH BREAKFAST AND SUPPER AND 35 UNITS WITH LUNCH   insulin degludec (TRESIBA FLEXTOUCH) 100 UNIT/ML FlexTouch Pen Inject 20 Units into the skin daily.   insulin lispro (HUMALOG KWIKPEN) 100 UNIT/ML KwikPen Take 25 units [place] breakfast and dinner and 30 units at lunch   Insulin Pen Needle (BD PEN NEEDLE MICRO U/F) 32G X 6 MM MISC DX:E11.65 USE TO CHECK BLOOD SUGARS THREE TIMES A DAY   Lancets Misc. MISC 1 each by Does  not apply route 4 (four) times daily -  with meals and at bedtime.   lubiprostone (AMITIZA) 8 MCG capsule Take 1 capsule (8 mcg total) by mouth daily with breakfast.   mirabegron ER (MYRBETRIQ) 50 MG TB24 tablet Take 1 tablet (50 mg total) by mouth daily.   mometasone-formoterol (DULERA) 100-5 MCG/ACT AERO Inhale 2 puffs into the lungs as needed for wheezing.    Multiple Vitamin (MULTIVITAMIN WITH MINERALS) TABS tablet Take 1 tablet by mouth daily.   ondansetron (ZOFRAN) 4 MG tablet Take 1 tablet (4 mg total) by mouth every 6 (six) hours as needed for nausea or vomiting.   OZEMPIC, 1 MG/DOSE, 4 MG/3ML SOPN INJECT 1mg  into THE SKIN every thursday   pantoprazole (PROTONIX) 40 MG tablet Take 30- 60 min before your first and last meals of the day   pravastatin (PRAVACHOL) 80 MG tablet Take 0.5 tablets (40 mg total) by mouth daily.   predniSONE (STERAPRED UNI-PAK 21 TAB) 10 MG (21) TBPK tablet Take 2 tablets the first day then one tablet daily for one week (Patient not taking: Reported on 08/03/2020)   pregabalin (LYRICA) 50 MG capsule Take 1 capsule (50 mg total) by mouth 3 (three) times daily.   spironolactone (ALDACTONE) 50 MG tablet TAKE ONE TABLET BY MOUTH EVERY MORNING   Vitamin D, Ergocalciferol, (DRISDOL) 1.25 MG (50000 UNIT) CAPS capsule Take 1 capsule (50,000 Units total) by mouth every 7 (seven) days. TAKE 1 CAPSULE BY MOUTH EVERY 7  DAYS ON WEDNESDAYS   No facility-administered encounter medications on file as of 08/29/2020.    Current Diagnosis: Patient Active Problem List   Diagnosis Date Noted   Postlaminectomy syndrome, not elsewhere classified 07/03/2020   S/P lumbar laminectomy 03/30/2020   Localized swelling, mass and lump, multiple sites 03/27/2018   Type II diabetes mellitus with renal manifestations (HCC) 12/27/2017   HLD (hyperlipidemia) 12/27/2017   HTN (hypertension) 12/27/2017   CKD (chronic kidney disease), stage III (HCC) 12/27/2017   Cough variant  asthma 12/01/2015   Osteoarthritis of left knee, primary localized 08/17/2014   Knee osteoarthritis 08/17/2014   Hypoglycemia 11/15/2013   Other and unspecified hyperlipidemia 08/06/2013   Type II diabetes mellitus, uncontrolled (HCC) 07/20/2013   Varicose veins of lower extremities with other complications 03/19/2013   Fibromyalgia    GERD 05/18/2010   ABDOMINAL PAIN, GENERALIZED 05/02/2010   OBESITY 04/20/2010   BURSITIS, LEFT SHOULDER 03/09/2010   Lipoma of arm s/p excision 01/29/2014 01/10/2010   DEPRESSION 12/27/2009   BACK PAIN WITH RADICULOPATHY 12/27/2009   CYSTITIS, ACUTE 10/21/2009   MICROSCOPIC HEMATURIA 10/18/2009   KNEE PAIN, BILATERAL 10/18/2009   NEUROPATHY 09/08/2009   Asthma 09/08/2009   STRESS INCONTINENCE 09/08/2009   CHEST PAIN 07/19/2009   ESOPHAGEAL STRICTURE 08/27/2005   Diabetes mellitus without complication (HCC) 08/27/1978   Reviewed chart for medication changes ahead of medication coordination call.  OVs- 08/02/2020- Lawanna Kobus Little (CCM Nurse), 08/03/2020- Dr Ophelia Charter (Orthopedics) since last care coordination call/Pharmacist visit.   Medication changes indicated:08/21/2020- Humalog 100 u/ml dosage change to 25 units breakfast and dinner and 30 units at lunch(PCP). 08/21/2020 Lyrica 50 mg added (PCP).      BP Readings from Last 3 Encounters:  07/07/20 136/70  07/01/20 135/83  05/03/20 140/76    Lab Results  Component Value Date   HGBA1C 10.5 (H) 07/07/2020     Patient obtains medications through [facility]herence Pack[facility]  90 Days   Last adherence delivery included:  Women's 50 Plus Multivitamin- 1 tablet daily (breakfast) Myrbetriq 50 mg- 1 tablet daily (breakfast) Spironolactone 50 mg- 1 tablet daily (breakfast)  Amlodipine 10 mg- one tablet daily (bedtime) Pravastatin 80 mg- 1/2 tablet daily (bedtime) Amitiza 8 mg- 1 capsule daily (breakfast) Farxiga 10 mg- 1 tablet daily (before breakfast)  Gabapentin 300 mg- 1 tablet  twice daily (vials) Ergocalciferol 50,000 units- 1 capsule weekly on Wednesdays  Patient declined the following medication last month: Humalog u100- Inject 25 units sq with breakfast and dinner, 35 units with lunchdue tousing with a sliding scale, patient has 2 pens left.  Patient is due for next adherence delivery on: 09/05/2020 . Called patient and reviewed medications and coordinated delivery.  This delivery to include: Farxiga 10 mg- 1 tablet daily  Humalog Kwikpen u100- Inject 23 units sq with breakfast and dinner, 35 units with lunch Ozempic 4mg /25ml Pen- Inject 1 mg into skin every Thursday  No short fill needed prior to delivery.   Coordinated acute future fill for Pregabalin 50 mg to be delivered 09/20/2020.  Patient declined the following medications: Women's 50 Plus Multivitamin- 1 tablet daily (breakfast) Myrbetriq 50 mg- 1 tablet daily (breakfast) Spironolactone 50 mg- 1 tablet daily (breakfast)  Amlodipine 10 mg- one tablet daily (bedtime) Pravastatin 80 mg- 1/2 tablet daily (bedtime) Amitiza 8 mg- 1 capsule daily (breakfast) Ergocalciferol 50,000 units- 1 capsule weekly on Wednesdays    Due to receiving a 90 day supply on 08/05/2020.  Tresiba FlexTouch- Inject 18 units into skin daily due to receiving a  50 day supply on 08/16/2020. Patient states she will use 18-20 units daily depending on her dinner readings, she has 3 pens left.  Gabapentin 300 mg- 1 capsule twice daily due to discontinuing medication. Patient states she doesn't feel like medication was helping so she stopped taking.   Novofine 32G x 28mm needles due to having an adequate supply.  Methocarbamol 750 mg- due to being discontinued. Pravastatin 80 mg due to medication being reduced to 40 mg.    Patient needs refills for- None.  Confirmed delivery date of 09/05/2020, advised patient that pharmacy will contact them the morning of delivery.  Follow-Up:  Coordination of Enhanced Pharmacy Services and  Pharmacist Review  Patient states blood sugars are better than what they were, she checks her blood sugars four times a day, recent readings: AM- 169, 332, 481, After breakfast- 181, 272. Dinner- 212, 259. Night- 222, 237. Patient is also check her blood pressures, recent readings 140/88 and 159/90. Patient uses a wrist blood pressure cuff and is aware that her PCP is would like her to have an arm blood pressure monitor. Patient aware that we are checking with her insurance to see if they will cover blood pressure monitor with [facility], if not her PCP can send one to [place] to be delivered to patient home.  Patient also mentioned that she feels like the Pregabalin 50 mg is helping her neuropathy pain  more than the Gabapentin so she stopped taking the Gabapentin 300 mg.  [name], CPP notified.  [name], [place] Pharmacist Assistant [phone]

## 2020-09-05 ENCOUNTER — Other Ambulatory Visit: Payer: Self-pay | Admitting: Nurse Practitioner

## 2020-09-05 DIAGNOSIS — E1165 Type 2 diabetes mellitus with hyperglycemia: Secondary | ICD-10-CM

## 2020-09-07 ENCOUNTER — Telehealth: Payer: Self-pay

## 2020-09-07 NOTE — Progress Notes (Signed)
09/07/20- Patient confirmed CCM follow-up call appointment with CPP, [name] on 09/08/20 at 11:30 AM. Informed the patient to have all medications and supplements near during phone call with CPP, [name]. Patient verbalized understanding.  Notified [name], CPP.  Raynelle Highland, [place] Pharmacist Assistant [phone]

## 2020-09-08 ENCOUNTER — Telehealth: Payer: Medicare Other

## 2020-09-08 ENCOUNTER — Telehealth: Payer: Self-pay

## 2020-09-08 DIAGNOSIS — Z794 Long term (current) use of insulin: Secondary | ICD-10-CM | POA: Diagnosis not present

## 2020-09-08 DIAGNOSIS — E118 Type 2 diabetes mellitus with unspecified complications: Secondary | ICD-10-CM | POA: Diagnosis not present

## 2020-09-09 ENCOUNTER — Other Ambulatory Visit: Payer: Self-pay

## 2020-09-09 MED ORDER — LINACLOTIDE 145 MCG PO CAPS: 145.0000 ug | ORAL_CAPSULE | Freq: Every day | ORAL | 3 refills | Status: DC

## 2020-09-09 NOTE — Chronic Care Management (AMB) (Signed)
Chronic Care Management Pharmacy Assistant   Name: [name]  MRN: [id number] DOB: Jun 05, 1951  Reason for Encounter: Medication Review  PCP : [name], MD    09/08/2020- Patient called with questions about appointment with [name], CPP scheduled for 11:30 am today and due to unforseen Internet and computer problems we needed to reschedule patient appointment to 09/15/2020. Patient aware and ok with appointment change. Patient also inquired why with her last delivery she received Humalog vials again and she uses the [facility]. Advised patient I would call pharmacy to make sure we do not have duplicate orders for the Humalog still on her profile, wrong prescription number could have been chosen and I will have them pick the vials up that she has and bring Humalog Kwikpen's tomorrow.  Called [facility], spoke with Eustace Pen, there were 2 Humalog profiles on file, one for the Kwikpen's and the other for the vials. They are discontinuing the Humalog vials off of patient's prescription profile and are scheduling delivery of the Humalog Kwikpen and pick up of the Humalog Vials.    Called patient back with updated information from [facility] and apologized about the mistake, patient is ok with delivery date tomorrow. Patient also mentioned that she received a vial of Farxiga 5 mg to take 2 tablets a day and she has [place] 5 mg in her packaging system to take 1 tablet daily, but medication was recently increased by PCP on 08/01/2020. Patient aware that she will take an extra tablet in the morning with her breakfast dose daily and we will make sure the to update her packaging system with next delivery.    Allergies:   Allergies  Allergen Reactions   Pollen Extract Other (See Comments)    Congestion/sneezing   Tomato Hives and Itching   Codeine Hives, Itching and Nausea And Vomiting    Medications: Outpatient Encounter Medications as of 09/08/2020  Medication Sig    amLODipine (NORVASC) 10 MG tablet TAKE ONE TABLET BY MOUTH EVERYDAY AT BEDTIME   Continuous Blood Gluc Receiver (FREESTYLE LIBRE READER) DEVI 1 each by Does not apply route See admin instructions. CGM device-Freestyle Libre   Continuous Blood Gluc Sensor (FREESTYLE LIBRE 14 DAY SENSOR) MISC 1 each by Does not apply route See admin instructions. CGM 14-day sensors; send refills to [employer] (in Epic)    docusate sodium (COLACE) 100 MG capsule Take 100 mg by mouth daily as needed for mild constipation.    FARXIGA 5 MG TABS tablet TAKE TWO TABLETS BY MOUTH BEFORE BREAKFAST   gabapentin (NEURONTIN) 300 MG capsule TAKE ONE CAPSULE BY MOUTH TWICE DAILY   HUMALOG 100 UNIT/ML injection INJECT 25 UNITS into THE SKIN WITH BREAKFAST AND SUPPER AND 35 UNITS WITH LUNCH   insulin degludec (TRESIBA FLEXTOUCH) 100 UNIT/ML FlexTouch Pen Inject 20 Units into the skin daily.   insulin lispro (HUMALOG KWIKPEN) 100 UNIT/ML KwikPen Take 25 units [place] breakfast and dinner and 30 units at lunch   Insulin Pen Needle (BD PEN NEEDLE MICRO U/F) 32G X 6 MM MISC DX:E11.65 USE TO CHECK BLOOD SUGARS THREE TIMES A DAY   Lancets Misc. MISC 1 each by Does not apply route 4 (four) times daily -  with meals and at bedtime.   mirabegron ER (MYRBETRIQ) 50 MG TB24 tablet Take 1 tablet (50 mg total) by mouth daily.   mometasone-formoterol (DULERA) 100-5 MCG/ACT AERO Inhale 2 puffs into the lungs as needed for wheezing.    Multiple Vitamin (MULTIVITAMIN  WITH MINERALS) TABS tablet Take 1 tablet by mouth daily.   ondansetron (ZOFRAN) 4 MG tablet Take 1 tablet (4 mg total) by mouth every 6 (six) hours as needed for nausea or vomiting.   OZEMPIC, 1 MG/DOSE, 4 MG/3ML SOPN INJECT 1mg  into THE SKIN every THURSDAY   pantoprazole (PROTONIX) 40 MG tablet Take 30- 60 min before your first and last meals of the day   pravastatin (PRAVACHOL) 80 MG tablet Take 0.5 tablets (40 mg total) by mouth daily.   predniSONE (STERAPRED  UNI-PAK 21 TAB) 10 MG (21) TBPK tablet Take 2 tablets the first day then one tablet daily for one week (Patient not taking: Reported on 08/03/2020)   pregabalin (LYRICA) 50 MG capsule Take 1 capsule (50 mg total) by mouth 3 (three) times daily.   spironolactone (ALDACTONE) 50 MG tablet TAKE ONE TABLET BY MOUTH EVERY MORNING   Vitamin D, Ergocalciferol, (DRISDOL) 1.25 MG (50000 UNIT) CAPS capsule Take 1 capsule (50,000 Units total) by mouth every 7 (seven) days. TAKE 1 CAPSULE BY MOUTH EVERY 7 DAYS ON WEDNESDAYS   [DISCONTINUED] lubiprostone (AMITIZA) 8 MCG capsule Take 1 capsule (8 mcg total) by mouth daily with breakfast.   No facility-administered encounter medications on file as of 09/08/2020.    Current Diagnosis: Patient Active Problem List   Diagnosis Date Noted   Postlaminectomy syndrome, not elsewhere classified 07/03/2020   S/P lumbar laminectomy 03/30/2020   Localized swelling, mass and lump, multiple sites 03/27/2018   Type II diabetes mellitus with renal manifestations ([place]) 12/27/2017   HLD (hyperlipidemia) 12/27/2017   HTN (hypertension) 12/27/2017   CKD (chronic kidney disease), stage III ([place]) 12/27/2017   Cough variant asthma 12/01/2015   Osteoarthritis of left knee, primary localized 08/17/2014   Knee osteoarthritis 08/17/2014   Hypoglycemia 11/15/2013   Other and unspecified hyperlipidemia 08/06/2013   Type II diabetes mellitus, uncontrolled ([place]) 07/20/2013   Varicose veins of lower extremities with other complications 52/77/8242   Fibromyalgia    GERD 05/18/2010   ABDOMINAL PAIN, GENERALIZED 05/02/2010   OBESITY 04/20/2010   BURSITIS, LEFT SHOULDER 03/09/2010   Lipoma of arm s/p excision 01/29/2014 01/10/2010   DEPRESSION 12/27/2009   BACK PAIN WITH RADICULOPATHY 12/27/2009   CYSTITIS, ACUTE 10/21/2009   MICROSCOPIC HEMATURIA 10/18/2009   KNEE PAIN, BILATERAL 10/18/2009   NEUROPATHY 09/08/2009   Asthma 09/08/2009   STRESS  INCONTINENCE 09/08/2009   CHEST PAIN 07/19/2009   ESOPHAGEAL STRICTURE 08/27/2005   Diabetes mellitus without complication ([place]) 35/36/1443     Follow-Up:  Pharmacist Review  [name], CPP notified.  [name], [place] Pharmacist Assistant [phone]

## 2020-09-14 ENCOUNTER — Ambulatory Visit (INDEPENDENT_AMBULATORY_CARE_PROVIDER_SITE_OTHER): Payer: Medicare Other | Admitting: Orthopaedic Surgery

## 2020-09-14 DIAGNOSIS — Z9889 Other specified postprocedural states: Secondary | ICD-10-CM | POA: Diagnosis not present

## 2020-09-14 DIAGNOSIS — M961 Postlaminectomy syndrome, not elsewhere classified: Secondary | ICD-10-CM | POA: Diagnosis not present

## 2020-09-14 NOTE — Progress Notes (Signed)
Office Visit Note   Patient: [name]           Date of Birth: 1951/08/06           MRN: [id number] Visit Date: 09/14/2020              Requested by: [name], [address] [place],  [place] [address] PCP: [name], MD   Assessment & Plan: Visit Diagnoses:  1. S/P lumbar laminectomy   2. Postlaminectomy syndrome, not elsewhere classified     Plan: MRI scan was reviewed.  Patient has subarticular foraminal disc extrusion with severe right foraminal stenosis.  We will set up for single epidural we discussed possible repeat surgery if she does not improvement her symptoms.  Pain has been fairly constant she is used currently Aleve.  Not taking any regular narcotics.  Office follow-up after epidural.  Follow-Up Instructions: No follow-ups on file.   Orders:  Orders Placed This Encounter  Procedures   Ambulatory referral to Physical Medicine Rehab   No orders of the defined types were placed in this encounter.     Procedures: No procedures performed   Clinical Data: No additional findings.   Subjective: Chief Complaint  Patient presents with   Lower Back - Pain, Follow-up    HPI 70 year old female returns.  Patient had microdiscectomy L4-5 on the right 02/19/2020 and did well for several months and then had recurrence of pain.  MRI showed some disc recurrence.  She states the pain is not as severe as it was before surgery.  She has tingling pressure.  She has been walking and doing her exercises.  No bowel bladder symptoms no fever chills.  Some temporary improvement with prednisone Dosepak.  She has been on Lyrica 50 mg 3 times daily.  Review of Systems updated unchanged all other systems.   Objective: Vital Signs: LMP  (LMP Unknown)   Physical Exam Constitutional:      Appearance: She is well-developed.  HENT:     Head: Normocephalic.     Right Ear: External ear normal.     Left Ear: External ear normal.  Eyes:      Pupils: Pupils are equal, round, and reactive to light.  Neck:     Thyroid: No thyromegaly.     Trachea: No tracheal deviation.  Cardiovascular:     Rate and Rhythm: Normal rate.  Pulmonary:     Effort: Pulmonary effort is normal.  Abdominal:     Palpations: Abdomen is soft.  Skin:    General: Skin is warm and dry.  Neurological:     Mental Status: She is alert and oriented to person, place, and time.  Psychiatric:        Mood and Affect: Mood and affect normal.        Behavior: Behavior normal.     Ortho Exam negative straight leg raise on the left.  Some pain with straight leg raising 90 degrees on the right positive sciatic notch tenderness well-healed lumbar incision.  Anterior tib EHL is intact no gastrocsoleus weakness no atrophy.  Distal pulses are intact.  Specialty Comments:  No specialty comments available.  Imaging: CLINICAL DATA:  Status post lumbar laminectomy. Post laminectomy syndrome, not elsewhere classified. Low back pain, prior surgery, new symptoms; rule out recurrent disc herniation. Additional history provided by scanning technologist: Patient reports central lumbar and posterior bilateral leg weakness and numbness, symptoms for 1 month.  EXAM: MRI LUMBAR SPINE WITHOUT AND WITH  CONTRAST  TECHNIQUE: Multiplanar and multiecho pulse sequences of the lumbar spine were obtained without and with intravenous contrast.  CONTRAST:  78mL MULTIHANCE GADOBENATE DIMEGLUMINE 529 MG/ML IV SOLN  COMPARISON:  Radiographs of the lumbar spine 02/19/2020. Lumbar spine MRI 02/13/2020.  FINDINGS: Segmentation: For the purposes of this dictation, five lumbar vertebrae are assumed and the caudal most well-formed intervertebral disc is designated L5-S1. Spinal numbering will remain consistent with that utilized on the prior lumbar spine MRI of 02/13/2020.  Alignment:  Trace L4-L5 grade 1 anterolisthesis.  Vertebrae: Vertebral body height is maintained. Mild  degenerative edema within the L4 and L5 posterior elements. No significant marrow edema identified elsewhere. No focal suspicious osseous lesion.  Conus medullaris and cauda equina: Conus extends to the L1-L2 level. No signal abnormality within the visualized distal spinal cord.  Paraspinal and other soft tissues: No abnormality identified within included portions of the abdomen/retroperitoneum. Postsurgical changes to the dorsal lumbar soft tissues at L4-L5. Atrophy of the lumbar paraspinal musculature on the right within the lower lumbar spine.  Disc levels:  Unless otherwise stated, the level by level findings below have not significantly changed since prior MRI 02/13/2020.  Moderate disc degeneration at L5-S1. No more than mild disc degeneration at the remaining levels.  T11-T12: Small disc bulge. No significant spinal canal or foraminal stenosis  No significant disc herniation, spinal canal stenosis or neural foraminal narrowing at the T12-L1 through L2-L3 levels.  L3-L4: Small disc bulge. Mild facet arthrosis and ligamentum flavum hypertrophy. No significant spinal canal or foraminal stenosis.  L4-L5: Interval right laminectomy and discectomy. Disc bulge. Residual/recurrent cranially migrated right subarticular/foraminal disc extrusion. Advanced facet arthrosis. Ligamentum flavum hypertrophy. The disc extrusion contributes to moderate/severe right subarticular stenosis with encroachment upon the descending right L5 nerve root (series 9, image 28). It also contributes to severe right foraminal stenosis with likely impingement of the exiting right L4 nerve root (series 12, image 4). No significant left subarticular or central canal stenosis. Unchanged mild left neural foraminal narrowing. Small right facet joint effusion, new.  L5-S1: Disc bulge with endplate spurring. Superimposed broad-based right center/subarticular disc protrusion. Moderate facet  arthrosis with mild ligamentum flavum hypertrophy. As before, the disc protrusion contributes to mild right subarticular narrowing with possible contact upon the descending right S1 nerve root. Central canal patent. Bilateral neural foraminal narrowing (mild right, moderate left).  IMPRESSION: At L4-L5, there has been interval right laminectomy and discectomy. A residual/recurrent cranially migrated right subarticular/foraminal disc extrusion contributes to multifactorial moderate/severe right subarticular stenosis with encroachment upon the descending right L5 nerve root. It also contributes to severe right neural foraminal narrowing with likely impingement of the exiting right L4 nerve root. Unchanged mild left neural foraminal narrowing. A small right facet joint effusion is new from the prior exam.  Lumbar spondylosis is otherwise unchanged as compared to the MRI of 02/13/2020, as detailed above.   Electronically Signed   By: [name] DO   On: 07/31/2020 15:48   PMFS History: Patient Active Problem List   Diagnosis Date Noted   Postlaminectomy syndrome, not elsewhere classified 07/03/2020   S/P lumbar laminectomy 03/30/2020   Localized swelling, mass and lump, multiple sites 03/27/2018   Type II diabetes mellitus with renal manifestations ([facility]) 12/27/2017   HLD (hyperlipidemia) 12/27/2017   HTN (hypertension) 12/27/2017   CKD (chronic kidney disease), stage III ([facility]) 12/27/2017   Cough variant asthma 12/01/2015   Osteoarthritis of left knee, primary localized 08/17/2014   Knee osteoarthritis 08/17/2014  Hypoglycemia 11/15/2013   Other and unspecified hyperlipidemia 08/06/2013   Type II diabetes mellitus, uncontrolled ([facility]) 07/20/2013   Varicose veins of lower extremities with other complications 123XX123   Fibromyalgia    GERD 05/18/2010   ABDOMINAL PAIN, GENERALIZED 05/02/2010   OBESITY 04/20/2010   BURSITIS, LEFT SHOULDER 03/09/2010    Lipoma of arm s/p excision 01/29/2014 01/10/2010   DEPRESSION 12/27/2009   BACK PAIN WITH RADICULOPATHY 12/27/2009   CYSTITIS, ACUTE 10/21/2009   MICROSCOPIC HEMATURIA 10/18/2009   KNEE PAIN, BILATERAL 10/18/2009   NEUROPATHY 09/08/2009   Asthma 09/08/2009   STRESS INCONTINENCE 09/08/2009   CHEST PAIN 07/19/2009   ESOPHAGEAL STRICTURE 08/27/2005   Diabetes mellitus without complication ([facility]) 123XX123   Past Medical History:  Diagnosis Date   Aortic stenosis    mild on 04/07/19 echo   Arthritis    L knee, hands, back    Asthma    Diabetes mellitus type 2, insulin dependent ([facility])    Fibromyalgia    Full dentures    Full dentures    GERD (gastroesophageal reflux disease)    H/O hiatal hernia    Headache    h/o migraines - was followed for a time with a wellness doctor   History of esophageal dilatation    History of rhabdomyolysis    03/ 2015   Hyperlipidemia    Hypertension    Insulin pump in place    since 12/ 2014--  [employer]   Lumbar disc herniation    right leg weakness   Osteoarthritis of left knee, primary localized 08/17/2014   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   Renal insufficiency    Rhabdomyolysis 11/15/2013   SUI (stress urinary incontinence, female)    Syncope 12/27/2017   Wears glasses    Wears glasses     Family History  Problem Relation Age of Onset   Diabetes Mother    Hypertension Mother    Lung cancer Mother        smoked   Diabetes Father    Hypertension Father    Lung cancer Father        smoked   Diabetes Brother    Heart failure Sister    Diabetes Maternal Grandmother    Cancer Maternal Grandmother     Past Surgical History:  Procedure Laterality Date   BLADDER SUSPENSION N/A 03/09/2014   Procedure: CYSTOSCOPY/SLING;  Surgeon: Reece Packer, MD;  Location: [facility];  Service: Urology;  Laterality: N/A;   CARDIAC CATHETERIZATION  07-20-2009   [facility] [place]   NORMAL  LVEF/  NORMAL CORONARY AND RENAL ARTERIES   CARPAL TUNNEL RELEASE Right 2000   CHOLECYSTECTOMY  1990   COLONOSCOPY WITH ESOPHAGOGASTRODUODENOSCOPY (EGD)  06-18-2002   ESOPHAGOGASTRODUODENOSCOPY (EGD) WITH ESOPHAGEAL DILATION  06-12-2010   EXCISION LEFT UPPER ARM MASS  01-29-2014   EYE SURGERY  2010   laser on R eye   INTERSTIM IMPLANT REMOVAL N/A 03/06/2019   Procedure: REMOVAL OF [place]yme IMPLANT;  Surgeon: Bjorn Loser, MD;  Location: [facility];  Service: Urology;  Laterality: N/A;   KNEE ARTHROSCOPY Right 1989  &  2002   LUMBAR LAMINECTOMY N/A 02/19/2020   Procedure: right L4 hemiaminectomy, microdiscectomy;  Surgeon: Marybelle Killings, MD;  Location: [facility];  Service: Orthopedics;  Laterality: N/A;   MULTIPLE TOOTH EXTRACTIONS     NEGATIVE SLEEP STUDY  2012   PER PT   PARTIAL KNEE ARTHROPLASTY Left 08/17/2014   Procedure: LEFT UNICOMPARTMENTAL KNEE;  Surgeon:  Johnny Bridge, MD;  Location: [facility];  Service: Orthopedics;  Laterality: Left;   Social History   Occupational History   Occupation: disability  Tobacco Use   Smoking status: Never Smoker   Smokeless tobacco: Never Used  Scientific laboratory technician Use: Never used  Substance and Sexual Activity   Alcohol use: No   Drug use: No   Sexual activity: Not Currently

## 2020-09-15 ENCOUNTER — Ambulatory Visit: Payer: Medicare Other

## 2020-09-15 DIAGNOSIS — E785 Hyperlipidemia, unspecified: Secondary | ICD-10-CM

## 2020-09-15 DIAGNOSIS — E559 Vitamin D deficiency, unspecified: Secondary | ICD-10-CM

## 2020-09-15 DIAGNOSIS — I10 Essential (primary) hypertension: Secondary | ICD-10-CM

## 2020-09-15 NOTE — Chronic Care Management (AMB) (Signed)
Chronic Care Management Pharmacy  Name: [name]  MRN: [id number] DOB: [birth year]  Chief Complaint/ HPI  [name],  70 y.o. , female presents for their Follow-Up CCM visit with the clinical pharmacist via telephone due to COVID-19 Pandemic. Patient reports that she enjoys packaging at [employer]. She does not feel like she to worry about her medications. She has two brothers and two sisters. She had leave because her blood sugar was always too high or too low. She worked at [employer] in [place], [place]. 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 She has one son who graduated from [employer] and her he works for [employer] in [place]. She reports that sugar runs in her family and she has been a diabetic for over 40 years. Patient reports that she wanted to be 51 and she is glad to be here for so long. She reports that her back is beginning to hurt again due to a bulging disc, but right now it is not as bad.   PCP : Gwendolyn Brine, FNP  Their chronic conditions include: Hypertension, Hyperlipidemia, Asthma, GERD, Diabetes with stage III CKD, Osteoarthritis  Office Visits: 05/03/20 OV: DM follow up. Pt is using FreeStyle Libre meter. HgbA1c up to 11.4%. B12 level within normal limits. Use sliding scale insulin for meal time blood sugars. Will likely start on long-acting insulin (Tresiba samples given, but advised to wait to start until directions given).   12/31/19 AWV and OV: Presented for Annual exam. Labs ordered (HgbA1c, Lipid panel, CMP, CBC, Vitamin D). BG has been more elevated recently. Started on Farxiga 11m daily. Fair control of BP, continue current medications. Continue to use pain cream for knee pain as needed; if worsens, will consider referral to orthopedics.     Consult Visit: 03/30/20 Orthopedic surgery OV w/ Dr. YLorin [name] Follow up post L4 hemilaminectomy on the right and  removal of a cephalad migrated free fragment with radiculopathy. Can do straight leg raise (unable to do before surgery). Continue leg raises using purse as counterweight. Pt happy that leg pain is significantly better. Return in 3 months.   03/02/20 Orthopedic surgery OV w/ [name] and steri-strips [name]. No drainage or signs of infection. Pt will gradually increase walking and avoid bending, twisting, and heavy lifting. Follow up w/ Dr. YLorin Mercyin 4 weeks.   02/19/20 Right L4-5 microdiscetomy hemilaminectomy and removal of free fragment.  02/17/20 Orthopedic surgery OV w/ Dr. YLorin [name] Referred to surgery for herniated nucleus polposus. In significant pain during exam. Pt had already eaten at before OV. Surgery scheduled for Friday, 6/25, for removal of migrated fragment.    02/16/20 Ortho OV w/ Dr. HJunius [name] Presented for low back pain and right sided radiculopathy for the past 2 weeks. Complains of urinary incontinence since new onset of pain. Rx for Dilaudid short course. [facility]/ Dr. YLorin Mercyfor possible surgical intervention.   02/13/20 ED visit: Presented for leg pain. Seen in ED 3 days ago for same issue. She fell on buttocks yesterday while using her walker (her leg gave out). Pain is worse with walking. Pt reports shooting pain, pressure, and weakness. MRI showed new L4-5 disc extrusion with right L4 nerve root impingement in lateral recess. 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 No acute fracture. Discharged with 6 days  prednisone taper.    02/10/20 ED visit: Presented with right leg pain for past several days. Pt denies trauma or injury. Pain starts in lower back and radiates to thigh, shin, and down her leg. Labs were unremarkable. Hip Xrays unremarkable. CT showed multilevel disease worse on right L4-5. Administered multiple rounds of pain medicine, muscle relaxants, steroids, and  NSAIDs. Pain improved, no MRI needed. Refer to spine surgeon outpatient. Prescribed short course of Norco and Baclofen at discharge. Follow up with PCP in 1-2 days.    10/18/19- ED visit: Presented for sudden onset left ear pain 4 days prior with clear, watery drainage; left eardrum found to be perforated upon exam; recommended naproxen or Tylenol for pain and follow up with ENT.   CCM Encounters: 01/15/20 RN: Reviewed and updated care plan and pt goals  09/16/19 PharmD: Comprehensive medication review, patient education. Pt reported issues receiving FreeStyle Libre.   08/07/19 PharmD: Pt recently seen by GI. Failed capsule swallow evaluation. GI to schedule esophageal dilation procedure. Encouraged pt to crush meds as directed due to difficulty swallowing.  07/31/19 PharmD: Working to obtain [employer] CGM. Pt approved for another 6 moths by [employer].   07/29/19 PharmD:  Collaboration with [facility] for patient's FreeStyle Libre testing supplies  07/27/19 PharmD: Patient requesting FreeStyle Libre assistance, denied at pharmacy. [facility] contacted.   07/22/19 RN: Patient education, medication review, coordination of diabetes testing supplies  07/17/19 RN: Compliance with GI and Urology treatment plans encourage, patient education.  Medications: Outpatient Encounter Medications as of 09/15/2020  Medication Sig  . amLODipine (NORVASC) 10 MG tablet TAKE ONE TABLET BY MOUTH EVERYDAY AT BEDTIME  . Continuous Blood Gluc Receiver (FREESTYLE LIBRE READER) DEVI 1 each by Does not apply route See admin instructions. CGM device-Freestyle Libre  . Continuous Blood Gluc Sensor (FREESTYLE LIBRE 14 DAY SENSOR) MISC 1 each by Does not apply route See admin instructions. CGM 14-day sensors; send refills to [facility] (in [place])   . docusate sodium (COLACE) 100 MG capsule Take 100 mg by mouth daily as needed for mild constipation.   [name] FARXIGA 5 MG TABS tablet TAKE TWO TABLETS BY  MOUTH BEFORE BREAKFAST  . gabapentin (NEURONTIN) 300 MG capsule TAKE ONE CAPSULE BY MOUTH TWICE DAILY  . HUMALOG 100 UNIT/ML injection INJECT 25 UNITS into THE SKIN WITH BREAKFAST AND SUPPER AND 35 UNITS WITH LUNCH  . insulin degludec (TRESIBA FLEXTOUCH) 100 UNIT/ML FlexTouch Pen Inject 20 Units into the skin daily.  . insulin lispro (HUMALOG KWIKPEN) 100 UNIT/ML KwikPen Take 25 units [place] breakfast and dinner and 30 units at lunch  . Insulin Pen Needle (BD PEN NEEDLE MICRO U/F) 32G X 6 MM MISC DX:E11.65 USE TO CHECK BLOOD SUGARS THREE TIMES A DAY  . Lancets Misc. MISC 1 each by Does not apply route 4 (four) times daily -  with meals and at bedtime.  [name] linaclotide (LINZESS) 145 MCG CAPS capsule Take 1 capsule (145 mcg total) by mouth daily before breakfast.  . mirabegron ER (MYRBETRIQ) 50 MG TB24 tablet Take 1 tablet (50 mg total) by mouth daily.  . mometasone-formoterol (DULERA) 100-5 MCG/ACT AERO Inhale 2 puffs into the lungs as needed for wheezing.   . Multiple Vitamin (MULTIVITAMIN WITH MINERALS) TABS tablet Take 1 tablet by mouth daily.  . ondansetron (ZOFRAN) 4 MG tablet Take 1 tablet (4 mg total) by mouth every 6 (six) hours as needed for nausea or vomiting.  [name] OZEMPIC, 1 MG/DOSE, 4 MG/3ML SOPN INJECT  5m into THE SKIN every THURSDAY  . pantoprazole (PROTONIX) 40 MG tablet Take 30- 60 min before your first and last meals of the day  . pravastatin (PRAVACHOL) 80 MG tablet Take 0.5 tablets (40 mg total) by mouth daily.  . predniSONE (STERAPRED UNI-PAK 21 TAB) 10 MG (21) TBPK tablet Take 2 tablets the first day then one tablet daily for one week (Patient not taking: Reported on 08/03/2020)  . pregabalin (LYRICA) 50 MG capsule Take 1 capsule (50 mg total) by mouth 3 (three) times daily.  .Marland Kitchenspironolactone (ALDACTONE) 50 MG tablet TAKE ONE TABLET BY MOUTH EVERY MORNING  . Vitamin D, Ergocalciferol, (DRISDOL) 1.25 MG (50000 UNIT) CAPS capsule Take 1 capsule (50,000 Units total) by mouth every 7  (seven) days. TAKE 1 CAPSULE BY MOUTH EVERY 7 DAYS ON WEDNESDAYS   No facility-administered encounter medications on file as of 09/15/2020.   Current Diagnosis/Assessment:    Goals Addressed            This Visit's Progress   . Pharmacy Care Plan       CARE PLAN ENTRY  Current Barriers:  . Chronic Disease Management support, education, and care coordination needs related to Hypertension, Hyperlipidemia, and Diabetes   Hypertension . Pharmacist Clinical Goal(s): o Over the next 45 days, patient will work with PharmD and providers to achieve BP goal <130/80 . Current regimen:  o Amlodipine 160mdaily o Spironolactone 5029maily . Interventions: o Discussed proper blood pressure monitoring technique (rest for at least 5 minutes, arm at heart level resting on a hard surface, after taking blood pressure medications, etc) o Reviewed recent home blood pressure readings o Will collaborate with PCP about adding valsartan 72m106mily . Patient self care activities - Over the next 45 days, patient will: o Check BP once daily and if symptomatic, document, and provide at future appointments o Ensure daily salt intake < 2300 mg/day  Hyperlipidemia . Pharmacist Clinical Goal(s): o Over the next 45 days, patient will work with PharmD and providers to achieve LDL goal < 70 . Current regimen:  o Pravastatin 72mg42m tablet daily . Interventions: o Provided dietary recommendations . Patient self care activities - Over the next 45 days, patient will: o Focus on a heart-healthy, balanced diet as discussed  Diabetes . Pharmacist Clinical Goal(s): o Over the next 30 days, patient will work with PharmD and providers to achieve A1c goal <7% . Current regimen:  o Ozempic 1mg w74mly o Humalog 25 units before breakfast, 35 units before lunch, and 23 units before dinner o Farxiga 10mg o42mdaily o Tresiba 18 units nightly . Interventions: o Dietary recommendations provided - Advised her to not  get discouraged and continue smart dietary choices, just because she doesn't necessarily feel a difference does not mean she is not making a difference in her health. o Reviewed recent blood glucose readings o Discussed appropriate goals for fasting blood sugar (80-130) and 2 hours after eating (less than 180) . Patient self care activities - Over the next 30 days, patient will: o Check blood sugar continuously, but at least 3-4 times daily, document, and provide at future appointments o Contact provider with any episodes of hypoglycemia o Continue diet and exercise as current o Continue to take medications as directed o Patient to start drinking at least 10 cups of water  Medication management . Pharmacist Clinical Goal(s): o Over the next 90 days, patient will work with PharmD and providers to maintain optimal medication adherence .  Current pharmacy: [facility] . Interventions o Comprehensive medication review performed. o Utilize [facility] pharmacy for medication synchronization, packaging and delivery . Patient self care activities - Over the next 90 days, patient will: o Focus on medication adherence by using adherence packaging o Take medications as prescribed o Report any questions or concerns to PharmD and/or provider(s)  Please see past updates related to this goal by clicking on the "Past Updates" button in the selected goal                           Diabetes   Goal A1c<7  Recent Relevant Labs: Lab Results  Component Value Date/Time   HGBA1C 10.5 (H) 07/07/2020 05:11 PM   HGBA1C 11.4 (H) 05/03/2020 02:55 PM   MICROALBUR 80 12/31/2019 03:19 PM   MICROALBUR 6.9 (H) 09/07/2016 01:41 PM   MICROALBUR 7.2 (H) 08/30/2015 01:59 PM   Kidney Function Lab Results  Component Value Date/Time   CREATININE 1.15 (H) 07/07/2020 05:11 PM   CREATININE 1.24 (H) 05/03/2020 02:55 PM   GFR 51.07 (L) 05/17/2017 08:54 AM   GFRNONAA 49 (L) 07/07/2020 05:11 PM   GFRAA 57 (L)  07/07/2020 05:11 PM  Stage 3a CKD   Checking BG: Multiple times daily with continuous glucose monitor (FreeStyle Libre)  Recent FBG Readings: 160, 147, 184 Recent pre-meal BG readings: 200, 182, 158, 171 Recent 2hr PP BG readings:  172, 212, 194, 161, 167, 183 Recent HS BG readings:   Recent Random BG readings:  Patient has failed these meds in past: Invokana, Levemir, Lantus, Victoza, metformin, Actos, Janumet Patient is currently controlled on the following medications:   Ozempic 43m weekly on Thursdays   Humalog Flexpen 23-35 units before meals (25U am, 35U lunch, 23U dinner)  Farxiga 594mtake 2 tablets daily  Tresiba 18 units nightly   Last diabetic Foot exam: 07/22/19 Last diabetic Eye exam: 08/10/19   We discussed:   Pt states that she feels alright; she does not have as many sluggish days as she used to  Discussed the difference between Ozempic, Tresiba, and Humalog  All injectable, but has different durations and mechanism of action  Pt states her family members have told her she's on too many diabetic medications  Advised pt that every pt is different and some need more than one medication to control blood sugar  Discussed that patient does not feel well at BG in the low 100s  Advised pt the her body will adjust to the lower BGs  FBG goal 80-130  2 HR PP goal <180  Pt has increased water intake -she has big bottles of filtered waters and has the water delivered to her. She has a 6 ounce glass of water and she drinks 6 glasses  Previously she was not drinking as much water   Goal: Patient is going to drink 10 glasses of 6 oz of water daily.   Continue current insulin dosing  Plan Continue current medications  CPA to assess patients BS at least every other week for highs and lows.   Vitamin D Deficiency      Last vitamin D/Calcium Lab Results  Component Value Date   VD25OH 34.4 05/03/2020   CALCIUM 9.5 07/07/2020     Patient has failed these  meds in past: none noted  Patient is currently controlled on the following medications:  . Ergocalciferol 50,000 Unit - take 1 capsule by mouth weekly   Will discuss at next office  visit.  Plan  Continue current medications   Asthma     No flowsheet data found. Lab Results  Component Value Date/Time   EOSPCT 0 02/10/2020 09:17 PM   EOSABS 0.0 02/10/2020 09:17 PM    Patient has failed these meds in past:  Patient is currently controlled on the following medications:  [name] Momentasone -Formoterol 100-5 mcg inhale 2 puffs into the lungs as needed for wheezing  Using maintenance inhaler regularly? No Frequency of rescue inhaler use:  infrequently  Will discuss at patients next office visit  Plan  Continue current medications   Hyperlipidemia   LDL goal < 70  Last lipids Lab Results  Component Value Date   CHOL 168 12/31/2019   HDL 54 12/31/2019   LDLCALC 98 12/31/2019   LDLDIRECT 152.8 10/09/2013   TRIG 88 12/31/2019   CHOLHDL 3.1 12/31/2019   Hepatic Function Latest Ref Rng & Units 07/07/2020 05/03/2020 02/19/2020  Total Protein 6.0 - 8.5 g/dL 7.0 7.1 7.1  Albumin 3.8 - 4.8 g/dL 4.2 4.0 3.5  AST 0 - 40 IU/L 19 12 17   ALT 0 - 32 IU/L 17 11 19   Alk Phosphatase 44 - 121 IU/L 107 130(H) 94  Total Bilirubin 0.0 - 1.2 mg/dL 0.3 0.7 1.5(H)  Bilirubin, Direct 0.00 - 0.40 mg/dL - - -     The 10-year ASCVD risk score Mikey Bussing DC Jr., et al., 2013) is: 23.2%   Values used to calculate the score:     Age: 75 years     Sex: Female     Is Non-Hispanic African American: Yes     Diabetic: Yes     Tobacco smoker: No     Systolic Blood Pressure: 573 mmHg     Is BP treated: Yes     HDL Cholesterol: 54 mg/dL     Total Cholesterol: 168 mg/dL   Patient has failed these meds in past: none noted  Patient is currently uncontrolled on the following medications:  . Pravastatin 80 mg- taking 1/2 tablet daily   We discussed:    Diet and exercise extensively  Recommended patient  decrease the amount of fried and fatty food that she is eating  Writing a food log of what she is eating   Plan Patient needs lipid panel and chemistry panel  Will collaborate with PCP to change patients medication to atorvastatin 10 mg tablet once a day.   Hypertension  Goal <130/80  Office blood pressures are  BP Readings from Last 3 Encounters:  07/07/20 136/70  07/01/20 135/83  05/03/20 140/76   Patient has failed these meds in the past: Carvedilol, clonidine, metoprolol, valsartan/HCTZ, losartan/HCTZ Patient is currently uncontrolled on the following medications:   Amlodipine 29m daily  Spironolactone 528mdaily  Patient checks BP at home daily  Patient home BP readings are ranging: 127/82  We discussed:  Pt mentions some stress as a reason for elevated BP  Discussed resting prior to checking BP  Pt states she has been doing this  Goal BP <130/80  Plan Continue current medications  Collaborate with PCP about adding valsartan 8047monstipation    Patient has failed these meds in past: none  Patient is currently controlled on the following medications:  . Linzess 145 mcg capsule- take daily  Will discuss at another office visit.   Plan  Continue current medications  Neuropathy/Fibromyalgia   Patient has failed these meds in past: none noted  Patient is currently controlled on the following medications:  .  Pregablin 50 mg taking 1 tablet three times per day  . Gabapentin 300 mg take 1 capsule by mouth twice daily.   We discussed:    Patient reports she rarely has pain between a 3-5   Working really well   Plan  Continue current medications   Chronic Kidney Disease Stage 3    Lab Results  Component Value Date   CREATININE 1.15 (H) 07/07/2020   BUN 9 07/07/2020   GFR 51.07 (L) 05/17/2017   GFRNONAA 49 (L) 07/07/2020   GFRAA 57 (L) 07/07/2020   NA 143 07/07/2020   K 4.5 07/07/2020   CALCIUM 9.5 07/07/2020   CO2 26 07/07/2020     Patient is currently controlled on the following medications:  . Farxiga 5 mg tablet take daily . Spironolactone 50 mg take daily  Will discuss at patients next office visit.   Plan I reviewed patients current medications and at this time there are no changes that need to be made to her current medication regimen. Will continue to monitor patient closely.  Continue current medications  GERD   Patient has failed these meds in past: None noted  Patient is currently controlled on the following medications:  . Pantoprazole 40 mg take daily   Will discuss at next office visit  Plan  Continue current medications    Vaccines   Reviewed and discussed patient's vaccination history.    Immunization History  Administered Date(s) Administered  . Fluad Quad(high Dose 65+) 05/03/2020  . Influenza Whole 06/27/2009, 06/13/2010  . Influenza, High Dose Seasonal PF 07/22/2019  . Influenza,inj,Quad PF,6+ Mos 06/03/2017  . Influenza-Unspecified 03/27/2013, 03/27/2014, 05/18/2015  . PFIZER(Purple Top)SARS-COV-2 Vaccination 09/21/2019, 10/02/2019, 05/24/2020  . Pneumococcal Conjugate-13 07/24/2017  . Pneumococcal Polysaccharide-23 08/27/2006, 11/29/2009, 03/24/2019  . Td 10/18/2009  . Tdap 12/27/2017    Plan  Will discuss at next office visit as needed.   Medication Management   Pt uses [facility] for all medications  Pt endorses 100% compliance  Currently getting Amitiza - enough until 11/07/2020  Insurance needs a prescription Linzess 145 mcg   We discussed:   Importance of medication adherence  Plan Utilize [facility] pharmacy for medication synchronization, packaging and delivery   Follow up: 2 months   [name], PharmD Clinical Pharmacist [facility] [phone]

## 2020-09-25 NOTE — Patient Instructions (Signed)
Visit Information CARE PLAN ENTRY  Current Barriers:  . Chronic Disease Management support, education, and care coordination needs related to Hypertension, Hyperlipidemia, and Diabetes   Hypertension . Pharmacist Clinical Goal(s): o Over the next 45 days, patient will work with PharmD and providers to achieve BP goal <130/80 . Current regimen:  o Amlodipine 10mg  daily o Spironolactone 50mg  daily . Interventions: o Discussed proper blood pressure monitoring technique (rest for at least 5 minutes, arm at heart level resting on a hard surface, after taking blood pressure medications, etc) o Reviewed recent home blood pressure readings o Will collaborate with PCP about adding valsartan 80mg  daily . Patient self care activities - Over the next 45 days, patient will: o Check BP once daily and if symptomatic, document, and provide at future appointments o Ensure daily salt intake < 2300 mg/day  Hyperlipidemia . Pharmacist Clinical Goal(s): o Over the next 45 days, patient will work with PharmD and providers to achieve LDL goal < 70 . Current regimen:  o Pravastatin 80mg  1/2 tablet daily . Interventions: o Provided dietary recommendations . Patient self care activities - Over the next 45 days, patient will: o Focus on a heart-healthy, balanced diet as discussed  Diabetes . Pharmacist Clinical Goal(s): o Over the next 30 days, patient will work with PharmD and providers to achieve A1c goal <7% . Current regimen:  o Ozempic 1mg  weekly o Humalog 25 units before breakfast, 35 units before lunch, and 23 units before dinner o [place] 10mg  once daily o Tresiba 18 units nightly . Interventions: o Dietary recommendations provided - Advised her to not get discouraged and continue smart dietary choices, just because she doesn't necessarily feel a difference does not mean she is not making a difference in her health. o Reviewed recent blood glucose readings o Discussed appropriate goals for  fasting blood sugar (80-130) and 2 hours after eating (less than 180) . Patient self care activities - Over the next 30 days, patient will: o Check blood sugar continuously, but at least 3-4 times daily, document, and provide at future appointments o Contact provider with any episodes of hypoglycemia o Continue diet and exercise as current o Continue to take medications as directed  Medication management . Pharmacist Clinical Goal(s): o Over the next 90 days, patient will work with PharmD and providers to maintain optimal medication adherence . Current pharmacy: [facility] . Interventions o Comprehensive medication review performed. o Utilize [facility] pharmacy for medication synchronization, packaging and delivery . Patient self care activities - Over the next 90 days, patient will: o Focus on medication adherence by using adherence packaging o Take medications as prescribed o Report any questions or concerns to PharmD and/or provider(s)    The patient verbalized understanding of instructions, educational materials, and care plan provided today and agreed to receive a mailed copy of patient instructions, educational materials, and care plan.   The pharmacy team will reach out to the patient again over the next 15 days.   Mayford Knife, [facility]

## 2020-09-26 ENCOUNTER — Other Ambulatory Visit: Payer: Self-pay

## 2020-09-26 DIAGNOSIS — E1165 Type 2 diabetes mellitus with hyperglycemia: Secondary | ICD-10-CM

## 2020-09-26 MED ORDER — TRESIBA FLEXTOUCH 100 UNIT/ML ~~LOC~~ SOPN: 20.0000 [IU] | PEN_INJECTOR | Freq: Every day | SUBCUTANEOUS | 1 refills | Status: DC

## 2020-09-30 ENCOUNTER — Encounter: Payer: Self-pay | Admitting: Nurse Practitioner

## 2020-09-30 ENCOUNTER — Other Ambulatory Visit: Payer: Self-pay

## 2020-09-30 ENCOUNTER — Ambulatory Visit (INDEPENDENT_AMBULATORY_CARE_PROVIDER_SITE_OTHER): Payer: Medicare Other | Admitting: Nurse Practitioner

## 2020-09-30 VITALS — BP 126/82 | HR 87 | Temp 98.2°F | Ht 61.8 in | Wt 151.0 lb

## 2020-09-30 DIAGNOSIS — R6889 Other general symptoms and signs: Secondary | ICD-10-CM

## 2020-09-30 DIAGNOSIS — N644 Mastodynia: Secondary | ICD-10-CM | POA: Diagnosis not present

## 2020-09-30 DIAGNOSIS — Z1231 Encounter for screening mammogram for malignant neoplasm of breast: Secondary | ICD-10-CM

## 2020-09-30 DIAGNOSIS — E1165 Type 2 diabetes mellitus with hyperglycemia: Secondary | ICD-10-CM

## 2020-09-30 DIAGNOSIS — I10 Essential (primary) hypertension: Secondary | ICD-10-CM

## 2020-09-30 NOTE — Progress Notes (Signed)
Rutherford Nail as a scribe for [name], FNP.,have documented all relevant documentation on the behalf of [name], FNP,as directed by  [name], FNP while in the presence of [name], [place]. This visit occurred during the SARS-CoV-2 public health emergency.  Safety protocols were in place, including screening questions prior to the visit, additional usage of staff PPE, and extensive cleaning of exam room while observing appropriate contact time as indicated for disinfecting solutions.  Subjective:     Patient ID: [name] , female    DOB: August 29, 1950 , 70 y.o.   MRN: [id number]   Chief Complaint  Patient presents with  . Breast Pain     pain under (R) breat area     HPI  Pt here for sharp pain under breast area.  She was having pain underneath her breast, lasted for a few seconds.  Occurred 3-4 weeks ago, intermittent.  Patient states she is not concerned about it.  The [employer] nurse practitioner did her visit and the patient   When she had her visit from [employer] NP visit her HgbA1c was down to 8.6.        Past Medical History:  Diagnosis Date  . Aortic stenosis    mild on 04/07/19 echo  . Arthritis    L knee, hands, back   . Asthma   . Diabetes mellitus type 2, insulin dependent ([place])   . Fibromyalgia   . Full dentures   . Full dentures   . GERD (gastroesophageal reflux disease)   . H/O hiatal hernia   . Headache    h/o migraines - was followed for a time with a wellness doctor  . History of esophageal dilatation   . History of rhabdomyolysis    03/ 2015  . Hyperlipidemia   . Hypertension   . Insulin pump in place    since 12/ 2014--  [employer]  . Lumbar disc herniation    right leg weakness  . Osteoarthritis of left knee, primary localized 08/17/2014  . 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  . Renal insufficiency   . Rhabdomyolysis 11/15/2013  . SUI (stress urinary incontinence, female)   . Syncope 12/27/2017  . Wears  glasses   . Wears glasses      Family History  Problem Relation Age of Onset  . Diabetes Mother   . Hypertension Mother   . Lung cancer Mother        smoked  . Diabetes Father   . Hypertension Father   . Lung cancer Father        smoked  . Diabetes Brother   . Heart failure Sister   . Diabetes Maternal Grandmother   . Cancer Maternal Grandmother      Current Outpatient Medications:  .  amLODipine (NORVASC) 10 MG tablet, TAKE ONE TABLET BY MOUTH EVERYDAY AT BEDTIME, Disp: 90 tablet, Rfl: 2 .  Continuous Blood Gluc Receiver (FREESTYLE LIBRE READER) DEVI, 1 each by Does not apply route See admin instructions. CGM device-Freestyle Libre, Disp: , Rfl:  .  Continuous Blood Gluc Sensor (FREESTYLE LIBRE 14 DAY SENSOR) MISC, 1 each by Does not apply route See admin instructions. CGM 14-day sensors; send refills to [facility] (in [place]), Disp: , Rfl:  .  docusate sodium (COLACE) 100 MG capsule, Take 100 mg by mouth daily as needed for mild constipation. , Disp: , Rfl:  .  FARXIGA 5 MG TABS tablet, TAKE TWO TABLETS BY MOUTH BEFORE BREAKFAST, Disp: 90 tablet, Rfl:  1 .  HUMALOG 100 UNIT/ML injection, INJECT 25 UNITS into THE SKIN WITH BREAKFAST AND SUPPER AND 35 UNITS WITH LUNCH, Disp: 40 mL, Rfl: 2 .  insulin degludec (TRESIBA FLEXTOUCH) 100 UNIT/ML FlexTouch Pen, Inject 20 Units into the skin daily., Disp: 9 mL, Rfl: 1 .  insulin lispro (HUMALOG KWIKPEN) 100 UNIT/ML KwikPen, Take 25 units  breakfast and dinner and 30 units at lunch, Disp: 30 mL, Rfl: 2 .  Insulin Pen Needle (BD PEN NEEDLE MICRO U/F) 32G X 6 MM MISC, DX:E11.65 USE TO CHECK BLOOD SUGARS THREE TIMES A DAY, Disp: 100 each, Rfl: 6 .  Lancets Misc. MISC, 1 each by Does not apply route 4 (four) times daily -  with meals and at bedtime., Disp: 300 each, Rfl: 3 .  linaclotide (LINZESS) 145 MCG CAPS capsule, Take 1 capsule (145 mcg total) by mouth daily before breakfast., Disp: 30 capsule, Rfl: 3 .  mirabegron ER (MYRBETRIQ) 50 MG  TB24 tablet, Take 1 tablet (50 mg total) by mouth daily., Disp: 90 tablet, Rfl: 0 .  mometasone-formoterol (DULERA) 100-5 MCG/ACT AERO, Inhale 2 puffs into the lungs as needed for wheezing. , Disp: , Rfl:  .  Multiple Vitamin (MULTIVITAMIN WITH MINERALS) TABS tablet, Take 1 tablet by mouth daily., Disp: , Rfl:  .  OZEMPIC, 1 MG/DOSE, 4 MG/3ML SOPN, INJECT 1mg  into THE SKIN every THURSDAY, Disp: 9 mL, Rfl: 1 .  pravastatin (PRAVACHOL) 80 MG tablet, Take 0.5 tablets (40 mg total) by mouth daily., Disp: 90 tablet, Rfl: 2 .  pregabalin (LYRICA) 50 MG capsule, Take 1 capsule (50 mg total) by mouth 3 (three) times daily., Disp: 90 capsule, Rfl: 2 .  spironolactone (ALDACTONE) 50 MG tablet, TAKE ONE TABLET BY MOUTH EVERY MORNING, Disp: 90 tablet, Rfl: 2 .  Vitamin D, Ergocalciferol, (DRISDOL) 1.25 MG (50000 UNIT) CAPS capsule, Take 1 capsule (50,000 Units total) by mouth every 7 (seven) days. TAKE 1 CAPSULE BY MOUTH EVERY 7 DAYS ON WEDNESDAYS, Disp: 15 capsule, Rfl: 1 .  gabapentin (NEURONTIN) 300 MG capsule, TAKE ONE CAPSULE BY MOUTH TWICE DAILY (Patient not taking: No sig reported), Disp: 60 capsule, Rfl: 0 .  ondansetron (ZOFRAN) 4 MG tablet, Take 1 tablet (4 mg total) by mouth every 6 (six) hours as needed for nausea or vomiting. (Patient not taking: Reported on 09/30/2020), Disp: 20 tablet, Rfl: 0 .  pantoprazole (PROTONIX) 40 MG tablet, Take 30- 60 min before your first and last meals of the day (Patient not taking: Reported on 09/30/2020), Disp: 60 tablet, Rfl: 11   Allergies  Allergen Reactions  . Pollen Extract Other (See Comments)    Congestion/sneezing  . Tomato Hives and Itching  . Codeine Hives, Itching and Nausea And Vomiting     Review of Systems  Constitutional: Negative.  Negative for fatigue.  HENT: Negative.   Respiratory: Negative.   Cardiovascular: Negative.   Endocrine: Negative for polydipsia, polyphagia and polyuria.  Musculoskeletal: Negative.        She was having pain  underneath her right breast  Skin: Negative.   Neurological: Negative for dizziness and headaches.  Psychiatric/Behavioral: Negative.      Today's Vitals   09/30/20 1014  BP: 126/82  Pulse: 87  Temp: 98.2 F (36.8 C)  TempSrc: Oral  Weight: 151 lb (68.5 kg)  Height: 5' 1.8" (1.57 m)   Body mass index is 27.8 kg/m.  Wt Readings from Last 3 Encounters:  09/30/20 151 lb (68.5 kg)  08/03/20 155 lb (70.3  kg)  07/07/20 155 lb 9.6 oz (70.6 kg)   Objective:  Physical Exam Vitals reviewed.  Constitutional:      General: She is not in acute distress.    Appearance: Normal appearance.  Cardiovascular:     Rate and Rhythm: Normal rate and regular rhythm.     Pulses: Normal pulses.     Heart sounds: Normal heart sounds. No murmur heard.   Pulmonary:     Effort: Pulmonary effort is normal. No respiratory distress.     Breath sounds: Normal breath sounds. No wheezing.  Chest:     Chest wall: No mass.  Breasts:     Right: Normal. No swelling, mass, tenderness, axillary adenopathy or supraclavicular adenopathy.     Left: Normal. No swelling, mass, tenderness, axillary adenopathy or supraclavicular adenopathy.    Lymphadenopathy:     Upper Body:     Right upper body: No supraclavicular or axillary adenopathy.     Left upper body: No supraclavicular or axillary adenopathy.  Neurological:     General: No focal deficit present.     Mental Status: She is alert and oriented to person, place, and time.     Cranial Nerves: No cranial nerve deficit.  Psychiatric:        Mood and Affect: Mood normal.        Behavior: Behavior normal.        Thought Content: Thought content normal.        Judgment: Judgment normal.         Assessment And Plan:     1. Abnormal ankle brachial index (ABI)  She had abnormal ABI with her home study with [facility], needs to be further evaluated  She does have a history of Type 2 DM   Does have intermittent tingling to lower extremities - Ambulatory  referral to Vascular Surgery  2. Breast pain  No current pain, she has not had a mammogram since 2022 will send for mammogram  This may have been musculoskeletal or GERD lasted for a few minutes  3. Uncontrolled type 2 diabetes mellitus with hyperglycemia (HCC)  Chronic, she is doing slightly better at her last visit, will have her to come in next week for labs  Will make adjustments to her medications as necessary at that time - Ambulatory referral to Vascular Surgery  4. Primary hypertension  Chronic, excellent control  Continue with current medications   Patient was given opportunity to ask questions. Patient verbalized understanding of the plan and was able to repeat key elements of the plan. All questions were answered to their satisfaction.  [name], FNP   I, [name], FNP, have reviewed all documentation for this visit. The documentation on 10/09/20 for the exam, diagnosis, procedures, and orders are all accurate and complete.   THE PATIENT IS ENCOURAGED TO PRACTICE SOCIAL DISTANCING DUE TO THE COVID-19 PANDEMIC.

## 2020-10-05 ENCOUNTER — Telehealth: Payer: Self-pay

## 2020-10-05 NOTE — Chronic Care Management (AMB) (Addendum)
Chronic Care Management Pharmacy Assistant   Name: [name]  MRN: [id number] DOB: Jun 04, 1951  Reason for Encounter: Medication Review  Patient Questions:  1.  Have you seen any other providers since your last visit? Yes, 09/14/2020- Dr Lorin Mercy (Ortho); 09/15/2020- [name], PharmD (CCM); 09/30/2020- [name], FNP (PCP).  2.  Any changes in your medicines or health? Yes- Prednisone 10 mg discontinued patient no longer taking.     PCP : [name], MD  Allergies:   Allergies  Allergen Reactions   Pollen Extract Other (See Comments)    Congestion/sneezing   Tomato Hives and Itching   Codeine Hives, Itching and Nausea And Vomiting    Medications: Outpatient Encounter Medications as of 10/05/2020  Medication Sig   amLODipine (NORVASC) 10 MG tablet TAKE ONE TABLET BY MOUTH EVERYDAY AT BEDTIME   Continuous Blood Gluc Receiver (FREESTYLE LIBRE READER) DEVI 1 each by Does not apply route See admin instructions. CGM device-Freestyle Libre   Continuous Blood Gluc Sensor (FREESTYLE LIBRE 14 DAY SENSOR) MISC 1 each by Does not apply route See admin instructions. CGM 14-day sensors; send refills to [employer] [facility] (in [place])   docusate sodium (COLACE) 100 MG capsule Take 100 mg by mouth daily as needed for mild constipation.    FARXIGA 5 MG TABS tablet TAKE TWO TABLETS BY MOUTH BEFORE BREAKFAST   gabapentin (NEURONTIN) 300 MG capsule TAKE ONE CAPSULE BY MOUTH TWICE DAILY (Patient not taking: No sig reported)   HUMALOG 100 UNIT/ML injection INJECT 25 UNITS into THE SKIN WITH BREAKFAST AND SUPPER AND 35 UNITS WITH LUNCH   insulin degludec (TRESIBA FLEXTOUCH) 100 UNIT/ML FlexTouch Pen Inject 20 Units into the skin daily.   insulin lispro (HUMALOG KWIKPEN) 100 UNIT/ML KwikPen Take 25 units [place] breakfast and dinner and 30 units at lunch   Insulin Pen Needle (BD PEN NEEDLE MICRO U/F) 32G X 6 MM MISC DX:E11.65 USE TO CHECK BLOOD SUGARS THREE TIMES A DAY   Lancets Misc. MISC 1 each  by Does not apply route 4 (four) times daily -  with meals and at bedtime.   linaclotide (LINZESS) 145 MCG CAPS capsule Take 1 capsule (145 mcg total) by mouth daily before breakfast.   mirabegron ER (MYRBETRIQ) 50 MG TB24 tablet Take 1 tablet (50 mg total) by mouth daily.   mometasone-formoterol (DULERA) 100-5 MCG/ACT AERO Inhale 2 puffs into the lungs as needed for wheezing.    Multiple Vitamin (MULTIVITAMIN WITH MINERALS) TABS tablet Take 1 tablet by mouth daily.   ondansetron (ZOFRAN) 4 MG tablet Take 1 tablet (4 mg total) by mouth every 6 (six) hours as needed for nausea or vomiting. (Patient not taking: Reported on 09/30/2020)   OZEMPIC, 1 MG/DOSE, 4 MG/3ML SOPN INJECT 1mg  into THE SKIN every THURSDAY   pantoprazole (PROTONIX) 40 MG tablet Take 30- 60 min before your first and last meals of the day (Patient not taking: Reported on 09/30/2020)   pravastatin (PRAVACHOL) 80 MG tablet Take 0.5 tablets (40 mg total) by mouth daily.   pregabalin (LYRICA) 50 MG capsule Take 1 capsule (50 mg total) by mouth 3 (three) times daily.   spironolactone (ALDACTONE) 50 MG tablet TAKE ONE TABLET BY MOUTH EVERY MORNING   Vitamin D, Ergocalciferol, (DRISDOL) 1.25 MG (50000 UNIT) CAPS capsule Take 1 capsule (50,000 Units total) by mouth every 7 (seven) days. TAKE 1 CAPSULE BY MOUTH EVERY 7 DAYS ON WEDNESDAYS   No facility-administered encounter medications on file as of 10/05/2020.  Current Diagnosis: Patient Active Problem List   Diagnosis Date Noted   Postlaminectomy syndrome, not elsewhere classified 07/03/2020   S/P lumbar laminectomy 03/30/2020   Localized swelling, mass and lump, multiple sites 03/27/2018   Type II diabetes mellitus with renal manifestations ([place]) 12/27/2017   HLD (hyperlipidemia) 12/27/2017   HTN (hypertension) 12/27/2017   CKD (chronic kidney disease), stage III ([place]) 12/27/2017   Cough variant asthma 12/01/2015   Osteoarthritis of left knee, primary localized 08/17/2014   Knee  osteoarthritis 08/17/2014   Hypoglycemia 11/15/2013   Other and unspecified hyperlipidemia 08/06/2013   Type II diabetes mellitus, uncontrolled ([place]) 07/20/2013   Varicose veins of lower extremities with other complications 96/75/9163   Fibromyalgia    GERD 05/18/2010   ABDOMINAL PAIN, GENERALIZED 05/02/2010   OBESITY 04/20/2010   BURSITIS, LEFT SHOULDER 03/09/2010   Lipoma of arm s/p excision 01/29/2014 01/10/2010   DEPRESSION 12/27/2009   BACK PAIN WITH RADICULOPATHY 12/27/2009   CYSTITIS, ACUTE 10/21/2009   MICROSCOPIC HEMATURIA 10/18/2009   KNEE PAIN, BILATERAL 10/18/2009   NEUROPATHY 09/08/2009   Asthma 09/08/2009   STRESS INCONTINENCE 09/08/2009   CHEST PAIN 07/19/2009   ESOPHAGEAL STRICTURE 08/27/2005   Diabetes mellitus without complication ([employer]) 84/66/5993   Reviewed chart for medication changes ahead of medication coordination call.  OVs, Consults- 09/14/2020- Dr Lorin Mercy (Ortho); 09/15/2020- [name], PharmD (CCM); 09/30/2020- [name], FNP (PCP) since last care coordination call/Pharmacist visit.  Medication changes indicated: Prednisone 10 mg discontinued patient no longer taking.    BP Readings from Last 3 Encounters:  09/30/20 126/82  07/07/20 136/70  07/01/20 135/83    Lab Results  Component Value Date   HGBA1C 10.5 (H) 07/07/2020     Patient obtains medications through Adherence Packaging  90 Days   Last adherence delivery included: Farxiga 10 mg- 1 tablet daily  Humalog Kwikpen u100- Inject 23 units sq with breakfast and dinner, 35 units with lunch  Ozempic 4mg /24ml Pen- Inject 1 mg into skin every Thursday   Patient declined the following medications last month:  Women's 50 Plus Multivitamin- 1 tablet daily (breakfast) Myrbetriq 50 mg- 1 tablet daily (breakfast) Spironolactone 50 mg- 1 tablet daily (breakfast)  Amlodipine 10 mg- one tablet daily (bedtime)  Pravastatin 80 mg- 1/2 tablet daily (bedtime)  Amitiza 8 mg- 1 capsule daily  (breakfast) Ergocalciferol 50,000 units- 1 capsule weekly on Wednesdays                                     Due to receiving a 90 day supply on 08/05/2020.   Tresiba FlexTouch- Inject 18 units into skin daily due to receiving a 50 day supply on 08/16/2020. Patient states she will use 18-20 units daily depending on her dinner readings, she has 3 pens left.   Gabapentin 300 mg- 1 capsule twice daily due to discontinuing medication. Patient states she doesn't feel like medication was helping so she stopped taking.    Novofine 32G x 54mm needles due to having an adequate supply.  Methocarbamol 750 mg- due to being discontinued. Pravastatin 80 mg due to medication being reduced to 40 mg.    Patient is due for next adherence delivery on: 10/10/2020. Called patient and reviewed medications and coordinated delivery.  This delivery to include: None, reviewed medications with patient and she is not in need of any refills at this time.   No Short fill or Acute fill needed.  Patient declined the following medications: Farxiga 10 mg- 1 tablet daily   Ozempic 4mg /71ml Pen- Inject 1 mg into skin every Thursday    Due to receiving a 90 day supply on 09/05/2020. Humalog Kwikpen u100- Inject 23 units sq with breakfast and dinner, 35 units with lunch    Due to receiving a 35 day supply on 09/08/2020 (will schedule future fill for 10/13/2020) Women's 50 Plus Multivitamin- 1 tablet daily (breakfast) Myrbetriq 50 mg- 1 tablet daily (breakfast) Spironolactone 50 mg- 1 tablet daily (breakfast)  Amlodipine 10 mg- one tablet daily (bedtime)  Pravastatin 80 mg- 1/2 tablet daily (bedtime)  Amitiza 8 mg- 1 capsule daily (breakfast) Ergocalciferol 50,000 units- 1 capsule weekly on Wednesdays                                     Due to receiving a 90 day supply on 08/05/2020.   Tresiba FlexTouch- Inject 18 units into skin daily due to receiving a 50 day supply on 08/16/2020. Patient states she will use 18-20 units  daily depending on her dinner readings, she has 2 pens left.  Coordinated acute fill form for Humalog kwikpen for delivery on 10/13/2020.  Patient needs refills for- None.  No delivery needed, patient aware we will follow up with her next month to review medications and coordinate delivery.   Follow-Up:  Coordination of Enhanced Pharmacy Services and Pharmacist Review  Patient is checking her blood sugars daily, recent readings 123- fasting am, 127- after lunch. Patient is also checking blood pressures with wrist cuff, recent readings 127/80- am and 130/82- pm. Patient aware I have sent another request for arm blood pressure monitor to be sent to [facility]. Patient mentioned that she is scheduled for a back injection on 10/10/20 with Dr Ernestina Patches (Ortho). [name], CPP notified.  [name], [place] Pharmacist Assistant [phone] CCM total time: 41 mins  I have reviewed the care management and care coordination activities outlined in this encounter and I am certifying that I agree with the content of this note. No further action required.  19 minutes spent in review, coordination, and documentation.  Mayford Knife, [facility] 10/06/20 2:30 PM

## 2020-10-09 DIAGNOSIS — Z794 Long term (current) use of insulin: Secondary | ICD-10-CM | POA: Diagnosis not present

## 2020-10-09 DIAGNOSIS — E118 Type 2 diabetes mellitus with unspecified complications: Secondary | ICD-10-CM | POA: Diagnosis not present

## 2020-10-10 ENCOUNTER — Encounter: Payer: Self-pay | Admitting: Physical Medicine and Rehabilitation

## 2020-10-10 ENCOUNTER — Ambulatory Visit (INDEPENDENT_AMBULATORY_CARE_PROVIDER_SITE_OTHER): Payer: Medicare Other | Admitting: Physical Medicine and Rehabilitation

## 2020-10-10 ENCOUNTER — Ambulatory Visit: Payer: Self-pay

## 2020-10-10 ENCOUNTER — Other Ambulatory Visit: Payer: Self-pay

## 2020-10-10 ENCOUNTER — Other Ambulatory Visit: Payer: Self-pay | Admitting: *Deleted

## 2020-10-10 ENCOUNTER — Ambulatory Visit: Payer: Medicare Other | Admitting: Internal Medicine

## 2020-10-10 VITALS — BP 164/81 | HR 77

## 2020-10-10 DIAGNOSIS — K2100 Gastro-esophageal reflux disease with esophagitis, without bleeding: Secondary | ICD-10-CM | POA: Insufficient documentation

## 2020-10-10 DIAGNOSIS — M5416 Radiculopathy, lumbar region: Secondary | ICD-10-CM

## 2020-10-10 DIAGNOSIS — K580 Irritable bowel syndrome with diarrhea: Secondary | ICD-10-CM | POA: Insufficient documentation

## 2020-10-10 DIAGNOSIS — M25569 Pain in unspecified knee: Secondary | ICD-10-CM

## 2020-10-10 MED ORDER — METHYLPREDNISOLONE ACETATE 80 MG/ML IJ SUSP
80.0000 mg | Freq: Once | INTRAMUSCULAR | Status: AC
  Administered 2020-10-10: 80 mg

## 2020-10-10 NOTE — Progress Notes (Signed)
Numeric Pain Rating Scale and Functional Assessment Average Pain 6   In the last MONTH (on 0-10 scale) has pain interfered with the following?  1. General activity like being  able to carry out your everyday physical activities such as walking, climbing stairs, carrying groceries, or moving a chair?  Rating(8)  Pain centered around tailbone and radiates down both legs to calf area. +Driver, -BT, -Dye Allergies.

## 2020-10-10 NOTE — Progress Notes (Signed)
[name] - 70 y.o. female MRN [id number]  Date of birth: 05-21-1951  Office Visit Note: Visit Date: 10/10/2020 PCP: [name], MD Referred by: [name], MD  Subjective: No chief complaint on file.  HPI:  [name] is a 70 y.o. female who comes in today at the request of Dr. Rodell Perna for planned Right L4-L5 Lumbar epidural steroid injection with fluoroscopic guidance.  The patient has failed conservative care including home exercise, medications, time and activity modification.  This injection will be diagnostic and hopefully therapeutic.  Please see requesting physician notes for further details and justification.  MRI reviewed with images and spine model.  MRI reviewed in the note below.    ROS Otherwise per HPI.  Assessment & Plan: Visit Diagnoses:    ICD-10-CM   1. Lumbar radiculopathy  M54.16 XR C-ARM NO REPORT    Epidural Steroid injection    methylPREDNISolone acetate (DEPO-MEDROL) injection 80 mg    Plan: No additional findings.   Meds & Orders:  Meds ordered this encounter  Medications  . methylPREDNISolone acetate (DEPO-MEDROL) injection 80 mg    Orders Placed This Encounter  Procedures  . XR C-ARM NO REPORT  . Epidural Steroid injection    Follow-up: Return in about 4 weeks (around 11/07/2020) for Rodell Perna, MD.   Procedures: No procedures performed  Lumbosacral Transforaminal Epidural Steroid Injection - Sub-Pedicular Approach with Fluoroscopic Guidance  Patient: [name]      Date of Birth: [birth year] MRN: [id number] PCP: [name], MD      Visit Date: 10/10/2020   Universal Protocol:    Date/Time: 10/10/2020  Consent Given By: the patient  Position: PRONE  Additional Comments: Vital signs were monitored before and after the procedure. Patient was prepped and draped in the usual sterile fashion. The correct patient, procedure, and site was verified.   Injection Procedure Details:    Procedure diagnoses: Lumbar radiculopathy [M54.16]    Meds Administered:  Meds ordered this encounter  Medications  . methylPREDNISolone acetate (DEPO-MEDROL) injection 80 mg    Laterality: Right  Location/Site:  L4-L5  Needle:5.0 in., 22 ga.  Short bevel or Quincke spinal needle  Needle Placement: Transforaminal  Findings:    -Comments: Excellent flow of contrast along the nerve, nerve root and into the epidural space.  Procedure Details: After squaring off the end-plates to get a true AP view, the C-arm was positioned so that an oblique view of the foramen as noted above was visualized. The target area is just inferior to the "nose of the scotty dog" or sub pedicular. The soft tissues overlying this structure were infiltrated with 2-3 ml. of 1% Lidocaine without Epinephrine.  The spinal needle was inserted toward the target using a "trajectory" view along the fluoroscope beam.  Under AP and lateral visualization, the needle was advanced so it did not puncture dura and was located close the 6 O'Clock position of the pedical in AP tracterory. Biplanar projections were used to confirm position. Aspiration was confirmed to be negative for CSF and/or blood. A 1-2 ml. volume of Isovue-250 was injected and flow of contrast was noted at each level. Radiographs were obtained for documentation purposes.   After attaining the desired flow of contrast documented above, a 0.5 to 1.0 ml test dose of 0.25% Marcaine was injected into each respective transforaminal space.  The patient was observed for 90 seconds post injection.  After no sensory deficits were reported, and normal lower extremity motor function was  noted,   the above injectate was administered so that equal amounts of the injectate were placed at each foramen (level) into the transforaminal epidural space.   Additional Comments:  The patient tolerated the procedure well Dressing: 2 x 2 sterile gauze and Band-Aid     Post-procedure details: Patient was observed during the procedure. Post-procedure instructions were reviewed.  Patient left the clinic in stable condition.      Clinical History: MRI LUMBAR SPINE WITHOUT AND WITH CONTRAST  TECHNIQUE: Multiplanar and multiecho pulse sequences of the lumbar spine were obtained without and with intravenous contrast.  CONTRAST:  11mL MULTIHANCE GADOBENATE DIMEGLUMINE 529 MG/ML IV SOLN  COMPARISON:  Radiographs of the lumbar spine 02/19/2020. Lumbar spine MRI 02/13/2020.  FINDINGS: Segmentation: For the purposes of this dictation, five lumbar vertebrae are assumed and the caudal most well-formed intervertebral disc is designated L5-S1. Spinal numbering will remain consistent with that utilized on the prior lumbar spine MRI of 02/13/2020.  Alignment:  Trace L4-L5 grade 1 anterolisthesis.  Vertebrae: Vertebral body height is maintained. Mild degenerative edema within the L4 and L5 posterior elements. No significant marrow edema identified elsewhere. No focal suspicious osseous lesion.  Conus medullaris and cauda equina: Conus extends to the L1-L2 level. No signal abnormality within the visualized distal spinal cord.  Paraspinal and other soft tissues: No abnormality identified within included portions of the abdomen/retroperitoneum. Postsurgical changes to the dorsal lumbar soft tissues at L4-L5. Atrophy of the lumbar paraspinal musculature on the right within the lower lumbar spine.  Disc levels:  Unless otherwise stated, the level by level findings below have not significantly changed since prior MRI 02/13/2020.  Moderate disc degeneration at L5-S1. No more than mild disc degeneration at the remaining levels.  T11-T12: Small disc bulge. No significant spinal canal or foraminal stenosis  No significant disc herniation, spinal canal stenosis or neural foraminal narrowing at the T12-L1 through L2-L3 levels.  L3-L4:  Small disc bulge. Mild facet arthrosis and ligamentum flavum hypertrophy. No significant spinal canal or foraminal stenosis.  L4-L5: Interval right laminectomy and discectomy. Disc bulge. Residual/recurrent cranially migrated right subarticular/foraminal disc extrusion. Advanced facet arthrosis. Ligamentum flavum hypertrophy. The disc extrusion contributes to moderate/severe right subarticular stenosis with encroachment upon the descending right L5 nerve root (series 9, image 28). It also contributes to severe right foraminal stenosis with likely impingement of the exiting right L4 nerve root (series 12, image 4). No significant left subarticular or central canal stenosis. Unchanged mild left neural foraminal narrowing. Small right facet joint effusion, new.  L5-S1: Disc bulge with endplate spurring. Superimposed broad-based right center/subarticular disc protrusion. Moderate facet arthrosis with mild ligamentum flavum hypertrophy. As before, the disc protrusion contributes to mild right subarticular narrowing with possible contact upon the descending right S1 nerve root. Central canal patent. Bilateral neural foraminal narrowing (mild right, moderate left).  IMPRESSION: At L4-L5, there has been interval right laminectomy and discectomy. A residual/recurrent cranially migrated right subarticular/foraminal disc extrusion contributes to multifactorial moderate/severe right subarticular stenosis with encroachment upon the descending right L5 nerve root. It also contributes to severe right neural foraminal narrowing with likely impingement of the exiting right L4 nerve root. Unchanged mild left neural foraminal narrowing. A small right facet joint effusion is new from the prior exam.  Lumbar spondylosis is otherwise unchanged as compared to the MRI of 02/13/2020, as detailed above.   Electronically Signed   By: [name] DO   On: 07/31/2020 15:48     Objective:  VS:  HT:    WT:   BMI:     BP:(!) 164/81  HR:77bpm  TEMP: ( )  RESP:96 % Physical Exam Vitals and nursing note reviewed.  Constitutional:      General: She is not in acute distress.    Appearance: Normal appearance. She is not ill-appearing.  HENT:     Head: Normocephalic and atraumatic.     Right Ear: External ear normal.     Left Ear: External ear normal.  Eyes:     Extraocular Movements: Extraocular movements intact.  Cardiovascular:     Rate and Rhythm: Normal rate.     Pulses: Normal pulses.  Pulmonary:     Effort: Pulmonary effort is normal. No respiratory distress.  Abdominal:     General: There is no distension.     Palpations: Abdomen is soft.  Musculoskeletal:        General: Tenderness present.     Cervical back: Neck supple.     Right lower leg: No edema.     Left lower leg: No edema.     Comments: Patient has good distal strength with no pain over the greater trochanters.  No clonus or focal weakness.  Skin:    Findings: No erythema, lesion or rash.  Neurological:     General: No focal deficit present.     Mental Status: She is alert and oriented to person, place, and time.     Sensory: No sensory deficit.     Motor: No weakness or abnormal muscle tone.     Coordination: Coordination normal.  Psychiatric:        Mood and Affect: Mood normal.        Behavior: Behavior normal.      Imaging: No results found.

## 2020-10-10 NOTE — Patient Instructions (Signed)

## 2020-10-10 NOTE — Procedures (Signed)
Lumbosacral Transforaminal Epidural Steroid Injection - Sub-Pedicular Approach with Fluoroscopic Guidance  Patient: [name]      Date of Birth: Feb 29, 1951 MRN: [id number] PCP: [name], MD      Visit Date: 10/10/2020   Universal Protocol:    Date/Time: 10/10/2020  Consent Given By: the patient  Position: PRONE  Additional Comments: Vital signs were monitored before and after the procedure. Patient was prepped and draped in the usual sterile fashion. The correct patient, procedure, and site was verified.   Injection Procedure Details:   Procedure diagnoses: Lumbar radiculopathy [M54.16]    Meds Administered:  Meds ordered this encounter  Medications  . methylPREDNISolone acetate (DEPO-MEDROL) injection 80 mg    Laterality: Right  Location/Site:  L4-L5  Needle:5.0 in., 22 ga.  Short bevel or Quincke spinal needle  Needle Placement: Transforaminal  Findings:    -Comments: Excellent flow of contrast along the nerve, nerve root and into the epidural space.  Procedure Details: After squaring off the end-plates to get a true AP view, the C-arm was positioned so that an oblique view of the foramen as noted above was visualized. The target area is just inferior to the "nose of the scotty dog" or sub pedicular. The soft tissues overlying this structure were infiltrated with 2-3 ml. of 1% Lidocaine without Epinephrine.  The spinal needle was inserted toward the target using a "trajectory" view along the fluoroscope beam.  Under AP and lateral visualization, the needle was advanced so it did not puncture dura and was located close the 6 O'Clock position of the pedical in AP tracterory. Biplanar projections were used to confirm position. Aspiration was confirmed to be negative for CSF and/or blood. A 1-2 ml. volume of Isovue-250 was injected and flow of contrast was noted at each level. Radiographs were obtained for documentation purposes.   After attaining the  desired flow of contrast documented above, a 0.5 to 1.0 ml test dose of 0.25% Marcaine was injected into each respective transforaminal space.  The patient was observed for 90 seconds post injection.  After no sensory deficits were reported, and normal lower extremity motor function was noted,   the above injectate was administered so that equal amounts of the injectate were placed at each foramen (level) into the transforaminal epidural space.   Additional Comments:  The patient tolerated the procedure well Dressing: 2 x 2 sterile gauze and Band-Aid    Post-procedure details: Patient was observed during the procedure. Post-procedure instructions were reviewed.  Patient left the clinic in stable condition.

## 2020-10-10 NOTE — Progress Notes (Signed)
She is to take 2 tablets of the 5 mg dose.

## 2020-10-11 ENCOUNTER — Ambulatory Visit: Payer: Medicare Other

## 2020-10-11 ENCOUNTER — Telehealth: Payer: Medicare Other

## 2020-10-11 ENCOUNTER — Ambulatory Visit (INDEPENDENT_AMBULATORY_CARE_PROVIDER_SITE_OTHER): Payer: Medicare Other

## 2020-10-11 DIAGNOSIS — I10 Essential (primary) hypertension: Secondary | ICD-10-CM

## 2020-10-11 DIAGNOSIS — Z9889 Other specified postprocedural states: Secondary | ICD-10-CM

## 2020-10-11 DIAGNOSIS — E1165 Type 2 diabetes mellitus with hyperglycemia: Secondary | ICD-10-CM

## 2020-10-11 DIAGNOSIS — E785 Hyperlipidemia, unspecified: Secondary | ICD-10-CM

## 2020-10-13 ENCOUNTER — Other Ambulatory Visit: Payer: Self-pay

## 2020-10-13 NOTE — Patient Instructions (Signed)
Goals Addressed    . Keep Low Back Pain Under Control   On track    Timeframe:  Long-Range Goal Priority:  High Start Date: 08/02/20                            Expected End Date:  03/02/21                     Follow Up Date 11/16/20   - call for medicine refill 2 or 3 days before it runs out - plan exercise or activity when pain is best controlled - prioritize tasks for the day - stay active - track times pain is worst and when it is best - track what makes the pain worse and what makes it better - work slower and less intense when having pain    Why is this important?    Day-to-day life can be hard when you have back pain.   Pain medicine is just one piece of the treatment puzzle. There are many things you can do to manage pain and keep your back strong.    Lifestyle changes, like stopping smoking and eating foods with Vitamin D and calcium, keep your bones and muscles healthy. Your back is better when it is supported by strong muscles.   You can try these action steps to help you manage your pain.     Notes:     [name] Monitor and Manage My Blood Sugar-Diabetes Type 2   On track    Timeframe:  Long-Range Goal Priority:  High Start Date: 08/02/20                            Expected End Date: 01/31/21                      Follow Up Date: 11/16/20  - check blood sugar at prescribed times - check blood sugar before and after exercise - check blood sugar if I feel it is too high or too low - enter blood sugar readings and medication or insulin into daily log - take the blood sugar log to all doctor visits - take the blood sugar meter to all doctor visits    Why is this important?    Checking your blood sugar at home helps to keep it from getting very high or very low.   Writing the results in a diary or log helps the doctor know how to care for you.   Your blood sugar log should have the time, date and the results.   Also, write down the amount of insulin or other medicine that  you take.   Other information, like what you ate, exercise done and how you were feeling, will also be helpful.     Notes:     . Obtain Eye Exam-Diabetes Type 2       Timeframe:  Long-Range Goal Priority:  Medium Start Date:  08/02/21                           Expected End Date: 01/31/21                       Follow Up Date 11/16/20    - schedule appointment with eye doctor    Why is this important?  Eye check-ups are important when you have diabetes.   Vision loss can be prevented.    Notes:     . Perform Foot Care-Diabetes Type 2       Timeframe:  Long-Range Goal Priority:  Medium Start Date:  08/02/20                           Expected End Date: 02/09/20                       Follow Up Date: 11/16/20   - check feet daily for cuts, sores or redness - do heel pump exercise 2 to 3 times each day - keep feet up while sitting - trim toenails straight across - wash and dry feet carefully every day - wear comfortable, cotton socks - wear comfortable, well-fitting shoes    Why is this important?    Good foot care is very important when you have diabetes.   There are many things you can do to keep your feet healthy and catch a problem early.    Notes:     . COMPLETED: Set My Target A1C-Diabetes Type 2       Timeframe:  Long-Range Goal Priority:  High Start Date:  08/02/20                           Expected End Date: 10/11/20                      Follow Up Date 10/11/20   - set target A1C    Why is this important?    Your target A1C is decided together by you and your doctor.   It is based on several things like your age and other health issues.    Notes:     . Stay Active and Independent-Low Back Pain   On track    Timeframe:  Long-Range Goal Priority:  High Start Date: 08/02/20                            Expected End Date: 01/31/21                      Follow Up Date: 11/16/20 - plan exercise or activity when pain is best controlled - prioritize tasks for  the day - stay active - check with my doctor before adding more exercise than I usually do - choose a type of activity I enjoy  - work slower and less intense when having pain    Why is this important?    Regular activity or exercise is important to managing back pain.   Activity helps to keep your muscles strong.   You will sleep better and feel more relaxed.   You will have more energy and feel less stressed.   If you are not active now, start slowly. Willys Salvino changes make a big difference.   Rest, but not too much.   Stay as active as you can and listen to your body's signals.     Notes:

## 2020-10-13 NOTE — Chronic Care Management (AMB) (Signed)
Chronic Care Management   CCM RN Visit Note  10/11/2020 Name: [name] MRN: [id number] DOB: 08/25/1951  Subjective: [name] is a 70 y.o. year old female who is a primary care patient of Gwendolyn Chard, MD. The care management team was consulted for assistance with disease management and care coordination needs.    Engaged with patient by telephone for follow up visit in response to provider referral for case management and/or care coordination services.   Consent to Services:  The patient was given information about Chronic Care Management services, agreed to services, and gave verbal consent prior to initiation of services.  Please see initial visit note for detailed documentation.   Patient agreed to services and verbal consent obtained.   Assessment: Review of patient past medical history, allergies, medications, health status, including review of consultants reports, laboratory and other test data, was performed as part of comprehensive evaluation and provision of chronic care management services.   SDOH (Social Determinants of Health) assessments and interventions performed:  Yes, no acute needs identified   CCM Care Plan  Allergies  Allergen Reactions  . Pollen Extract Other (See Comments)    Congestion/sneezing  . Tomato Hives and Itching  . Codeine Hives, Itching and Nausea And Vomiting    Outpatient Encounter Medications as of 10/11/2020  Medication Sig  . amLODipine (NORVASC) 10 MG tablet TAKE ONE TABLET BY MOUTH EVERYDAY AT BEDTIME  . Continuous Blood Gluc Receiver (FREESTYLE LIBRE READER) DEVI 1 each by Does not apply route See admin instructions. CGM device-Freestyle Libre  . Continuous Blood Gluc Sensor (FREESTYLE LIBRE 14 DAY SENSOR) MISC 1 each by Does not apply route See admin instructions. CGM 14-day sensors; send refills to [name]  . docusate sodium (COLACE) 100 MG capsule Take 100 mg by mouth daily as needed for mild  constipation.   [name] FARXIGA 5 MG TABS tablet TAKE TWO TABLETS BY MOUTH BEFORE BREAKFAST  . gabapentin (NEURONTIN) 300 MG capsule TAKE ONE CAPSULE BY MOUTH TWICE DAILY (Patient not taking: No sig reported)  . HUMALOG 100 UNIT/ML injection INJECT 25 UNITS into THE SKIN WITH BREAKFAST AND SUPPER AND 35 UNITS WITH LUNCH  . insulin degludec (TRESIBA FLEXTOUCH) 100 UNIT/ML FlexTouch Pen Inject 20 Units into the skin daily.  . insulin lispro (HUMALOG KWIKPEN) 100 UNIT/ML KwikPen Take 25 units [name] breakfast and dinner and 30 units at lunch  . Insulin Pen Needle (BD PEN NEEDLE MICRO U/F) 32G X 6 MM MISC DX:E11.65 USE TO CHECK BLOOD SUGARS THREE TIMES A DAY  . Lancets Misc. MISC 1 each by Does not apply route 4 (four) times daily -  with meals and at bedtime.  [name] linaclotide (LINZESS) 145 MCG CAPS capsule Take 1 capsule (145 mcg total) by mouth daily before breakfast.  . mirabegron ER (MYRBETRIQ) 50 MG TB24 tablet Take 1 tablet (50 mg total) by mouth daily.  . mometasone-formoterol (DULERA) 100-5 MCG/ACT AERO Inhale 2 puffs into the lungs as needed for wheezing.   . Multiple Vitamin (MULTIVITAMIN WITH MINERALS) TABS tablet Take 1 tablet by mouth daily.  . ondansetron (ZOFRAN) 4 MG tablet Take 1 tablet (4 mg total) by mouth every 6 (six) hours as needed for nausea or vomiting. (Patient not taking: Reported on 09/30/2020)  . OZEMPIC, 1 MG/DOSE, 4 MG/3ML SOPN INJECT 1mg  into THE SKIN every THURSDAY  . pantoprazole (PROTONIX) 40 MG tablet Take 30- 60 min before your first and last meals of the day (Patient not taking:  Reported on 09/30/2020)  . pravastatin (PRAVACHOL) 80 MG tablet Take 0.5 tablets (40 mg total) by mouth daily.  . pregabalin (LYRICA) 50 MG capsule Take 1 capsule (50 mg total) by mouth 3 (three) times daily.  [name] spironolactone (ALDACTONE) 50 MG tablet TAKE ONE TABLET BY MOUTH EVERY MORNING  . Vitamin D, Ergocalciferol, (DRISDOL) 1.25 MG (50000 UNIT) CAPS capsule Take 1 capsule (50,000 Units total) by mouth  every 7 (seven) days. TAKE 1 CAPSULE BY MOUTH EVERY 7 DAYS ON WEDNESDAYS   No facility-administered encounter medications on file as of 10/11/2020.    Patient Active Problem List   Diagnosis Date Noted  . Irritable bowel syndrome with diarrhea 10/10/2020  . Gastroesophageal reflux disease with esophagitis 10/10/2020  . Postlaminectomy syndrome, not elsewhere classified 07/03/2020  . S/P lumbar laminectomy 03/30/2020  . Localized swelling, mass and lump, multiple sites 03/27/2018  . Type II diabetes mellitus with renal manifestations ([name]) 12/27/2017  . HLD (hyperlipidemia) 12/27/2017  . HTN (hypertension) 12/27/2017  . CKD (chronic kidney disease), stage III ([name]) 12/27/2017  . Cough variant asthma 12/01/2015  . Osteoarthritis of left knee, primary localized 08/17/2014  . Knee osteoarthritis 08/17/2014  . Hypoglycemia 11/15/2013  . Other and unspecified hyperlipidemia 08/06/2013  . Type II diabetes mellitus, uncontrolled ([name]) 07/20/2013  . Varicose veins of lower extremities with other complications 37/16/9678  . Fibromyalgia   . GERD 05/18/2010  . ABDOMINAL PAIN, GENERALIZED 05/02/2010  . OBESITY 04/20/2010  . BURSITIS, LEFT SHOULDER 03/09/2010  . Lipoma of arm s/p excision 01/29/2014 01/10/2010  . DEPRESSION 12/27/2009  . BACK PAIN WITH RADICULOPATHY 12/27/2009  . CYSTITIS, ACUTE 10/21/2009  . MICROSCOPIC HEMATURIA 10/18/2009  . KNEE PAIN, BILATERAL 10/18/2009  . NEUROPATHY 09/08/2009  . Asthma 09/08/2009  . STRESS INCONTINENCE 09/08/2009  . CHEST PAIN 07/19/2009  . ESOPHAGEAL STRICTURE 08/27/2005  . Diabetes mellitus without complication ([place]) 93/81/0175    Conditions to be addressed/monitored:DM, HTN, HLD, s/p laminectomy   Care Plan : Manage low back pain  Updates made by [name], RN since 10/13/2020 12:00 AM    Problem: Chronic Low Back Pain   Priority: High    Long-Range Goal: Chronic Low Back Pain Managed   Start Date: 08/02/2020  Expected End Date:  01/31/2021  Recent Progress: On track  Priority: High  Note:   Current Barriers:   Ineffective Self Health Maintenance  Currently UNABLE TO independently self manage needs related to chronic health conditions.   Knowledge Deficits related to short term plan for care coordination needs and long term plans for chronic disease management needs Nurse Case Manager Clinical Goal(s):   Over the next 180 days, patient will work with care management team to address care coordination and chronic disease management needs related to Disease Management  Educational Needs  Care Coordination  Medication Management and Education  Psychosocial Support   Interventions:  10/11/20 successful call completed with patient   Encouraged use of multimodal interventions, such as physical therapy, distraction,  relaxation, and or application of heat or cold.   Evaluated pain level, efficacy, tolerability and adherence to pain management plan.   Assessed if pain is associated with mobility or at rest, location, intensity, frequency, duration, recurrence, pattern and description (e.g., cramping, burning, aching), triggers and relieving factors.    Determined patient continues to follow up with Orthopedics for management of her back pain   Determined patient received steroid injections to her lumbar spine on 10/10/20 with minimal effectiveness as of today  Educated on  importance to continue adherence to HEP as directed   Discussed plans with patient for ongoing care management follow up and provided patient with direct contact information for care management team Patient Goals/Self Care/Activities:  - call for medicine refill 2 or 3 days before it runs out  - plan exercise or activity when pain is best controlled  - prioritize tasks for the day  - stay active  - track times pain is worst and when it is best  - track what makes the pain worse and what makes it better  - work slower and less intense  when having pain   Follow Up Plan: Telephone follow up appointment with care management team member scheduled for:  11/16/20   Problem: Functional Decline   Priority: High    Long-Range Goal: Maintain Mobility and Function   Start Date: 08/02/2020  Expected End Date: 01/31/2021  Recent Progress: On track  Priority: High  Note:   Current Barriers:   Ineffective Self Health Maintenance  Currently UNABLE TO independently self manage needs related to chronic health conditions.   Knowledge Deficits related to short term plan for care coordination needs and long term plans for chronic disease management needs Nurse Case Manager Clinical Goal(s):   Over the next 180 days, patient will work with care management team to address care coordination and chronic disease management needs related to Disease Management  Educational Needs  Care Coordination  Medication Management and Education  Psychosocial Support   Interventions: 10/11/20 call completed with patient    Emphasized the importance of physical activity and aerobic exercise as included in treatment plan; assess barriers to adherence; consider patient's abilities and preferences.   Encouraged gradual increase in activity or exercise instead of stopping if pain occurs.   Reinforced individual therapy exercise prescription, such as strengthening, stabilization and stretching programs.   Encouraged activity and mobility modifications to facilitate optimal function, such as using a log roll for bed mobility or dressing from a seated position.   Reinforced individual adaptive equipment recommendations to limit excessive spinal movements, such as a Systems analyst.   Discussed plans with patient for ongoing care management follow up and provided patient with direct contact information for care management team Patient Goals/Self Care/Activities: - plan exercise or activity when pain is best controlled - prioritize tasks for the  day - stay active - check with my doctor before adding more exercise than I usually do - choose a type of activity I enjoy  - work slower and less intense when having pain   Follow Up Plan: Telephone follow up appointment with care management team member scheduled for: 11/16/20   Care Plan : Manage Diabetes Type 2 (Adult)  Updates made by [name], RN since 10/13/2020 12:00 AM    Problem: Glycemic Management (Diabetes, Type 2)   Priority: High    Long-Range Goal: Glycemic Management Optimized   Start Date: 08/02/2020  Expected End Date: 01/31/2021  Recent Progress: On track  Priority: High  Note:   Objective:  Lab Results  Component Value Date   HGBA1C 10.5 (H) 07/07/2020 .   Lab Results  Component Value Date   CREATININE 1.15 (H) 07/07/2020   CREATININE 1.24 (H) 05/03/2020   CREATININE 1.21 (H) 02/19/2020   Current Barriers:  [name] Knowledge Deficits related to basic Diabetes pathophysiology and self care/management . Knowledge Deficits related to medications used for management of diabetes Case Manager Clinical Goal(s):  [name] Over the next 180 days, patient will demonstrate  improved adherence to prescribed treatment plan for diabetes self care/management as evidenced by:  . daily monitoring and recording of CBG  . adherence to ADA/ carb modified diet . exercise 3-5 days/week . adherence to prescribed medication regimen Interventions:  . Provided education to patient about basic DM disease process . Reviewed medications with patient and discussed importance of medication adherence . Educated patient on importance of monitoring CBG's closely following lumbar spinal steroid injections and alert PCP for abnormally high readings that do not respond to sliding scale regimen  . Advised patient, providing education and rationale, to check cbg 2-4 and record, calling the CCM team and or PCP for findings outside established parameters.    Counseled patient to anticipate A1C testing  (point-of-care) every 3 to 6 months based on goal attainment.   Reviewed mutually-set A1C goal or target range  Educated on use of antihyperglycemic with or without insulin and periodic adjustments; consider active involvement of pharmacist.   Compared self-reported symptoms of hypo or hyperglycemia to blood glucose levels, diet and fluid intake, current medications, psychosocial and physiologic stressors, change in activity and barriers to care adherence.   Promoted self-monitoring of blood glucose levels.   Assessed and address barriers to management plan, such as food insecurity, age, developmental ability, depression, anxiety, fear of hypoglycemia or weight gain, as well as medication cost, side effects and complicated regimen.   Encouraged regular dental care for treatment of periodontal disease; refer to dental provider when needed.    Discussed plans with patient for ongoing care management follow up and provided patient with direct contact information for care management team Patient Goals/Self-Care Activities . Over the next 180 days, patient will:  - Self administers oral medications as prescribed Self administers insulin as prescribed Self administers injectable DM medication (Ozempic) as prescribed Attends all scheduled provider appointments Checks blood sugars as prescribed and utilize hyper and hypoglycemia protocol as needed Adheres to prescribed ADA/carb modified  Follow Up Plan: Telephone follow up appointment with care management team member scheduled for: 11/16/20   Problem: Disease Progression (Diabetes, Type 2)   Priority: High    Long-Range Goal: Disease Progression Prevented or Minimized   Start Date: 08/02/2020  Expected End Date: 01/31/2021  Recent Progress: On track  Priority: High  Note:   Objective:  Lab Results  Component Value Date   HGBA1C 10.5 (H) 07/07/2020 .   Lab Results  Component Value Date   CREATININE 1.15 (H) 07/07/2020   CREATININE 1.24  (H) 05/03/2020   CREATININE 1.21 (H) 02/19/2020   Current Barriers:  [name] Knowledge Deficits related to basic Diabetes pathophysiology and self care/management Case Manager Clinical Goal(s):  [name] Over the next 180 days, patient will demonstrate improved adherence to prescribed treatment plan for diabetes self care/management as evidenced by:  . daily monitoring and recording of CBG  . adherence to ADA/ carb modified diet . exercise 3-5 days/week . adherence to prescribed medication regimen Interventions:  10/11/20 completed call with patient  . Provided education to patient about basic DM disease process . Reviewed medications with patient and discussed importance of medication adherence  Ensured completion of annual comprehensive foot exam and dilated eye exam.    Implement additional individualized goals and interventions based on identified risk factors.   Encouraged lifestyle changes, such as increased intake of plant-based foods, stress reduction, consistent physical activity and smoking cessation to prevent long-term complications and chronic disease.    Individualized activity and exercise recommendations while considering potential limitations, such as  neuropathy, retinopathy or the ability to prevent hyperglycemia or hypoglycemia.    Assessed for signs/symptoms and risk factors for hypertension, sleep-disordered breathing, neuropathy (including changes in gait and balance), retinopathy, nephropathy and sexual dysfunction.   Discussed plans with patient for ongoing care management follow up and provided patient with direct contact information for care management team Patient Goals/Self-Care Activities . Over the next 180 days, patient will:  - Self administers oral medications as prescribed Self administers insulin as prescribed Self administers injectable DM medication (Ozempic) as prescribed Attends all scheduled provider appointments Checks blood sugars as prescribed and utilize  hyper and hypoglycemia protocol as needed Adheres to prescribed ADA/carb modified  Follow Up Plan: Telephone follow up appointment with care management team member scheduled for: 11/16/20     Plan:Telephone follow up appointment with care management team member scheduled for:  11/16/20  [name], RN, BSN, CCM Care Management Coordinator [place] [facility]  Direct Phone: [phone]

## 2020-10-24 ENCOUNTER — Telehealth: Payer: Self-pay

## 2020-10-24 ENCOUNTER — Other Ambulatory Visit: Payer: Self-pay | Admitting: Nurse Practitioner

## 2020-10-24 DIAGNOSIS — K5900 Constipation, unspecified: Secondary | ICD-10-CM

## 2020-10-24 NOTE — Chronic Care Management (AMB) (Signed)
10/24/2020- Patient called [facility] inquiring about medications needed and sent by PCP. [name], FNP sent in Amtiza 8 mcg, Pravastatin 40 mg and Vitamin D 1.25 mg to [facility]. Called patient, [place] Mould is not going to be covered by insurance any longer, they will cover Linzess 145 mcg and the Pravastatin 40 mg was changed, she was taking 80 mg. Patient also needed Vitamin D tabets that she takes once a week. Patient aware request sent to PCP for the [place] and pharmacy updated on these medications changes. Acute fill form sent to [facility] to delivery these 3 medication 10/25/2020, short fill until new packages start. Patient aware, [name], CPP notified.  [name], [place] Pharmacist Assistant [phone]

## 2020-10-25 ENCOUNTER — Encounter: Payer: Medicare Other | Admitting: Vascular Surgery

## 2020-10-25 ENCOUNTER — Other Ambulatory Visit: Payer: Self-pay

## 2020-10-25 ENCOUNTER — Inpatient Hospital Stay (HOSPITAL_COMMUNITY): Admission: RE | Admit: 2020-10-25 | Payer: Medicare Other | Source: Ambulatory Visit

## 2020-10-25 ENCOUNTER — Encounter (HOSPITAL_COMMUNITY): Payer: Medicare Other

## 2020-10-25 MED ORDER — LINACLOTIDE 145 MCG PO CAPS: 145.0000 ug | ORAL_CAPSULE | Freq: Every day | ORAL | 3 refills | Status: DC

## 2020-11-01 ENCOUNTER — Telehealth: Payer: Self-pay

## 2020-11-01 NOTE — Chronic Care Management (AMB) (Addendum)
Chronic Care Management Pharmacy Assistant   Name: DANALI MARINOS  MRN: [id number] DOB: April 10, 1951  Reason for Encounter: Medication Review   Recent office visits: 10/11/2020- Glenard Haring Little (CCM)  Recent consult visits:  10/10/2020- Dr Ernestina Patches (Physical Medicine)  Hospital visits:  None in previous 6 months   Reviewed chart for medication changes ahead of medication coordination call.  Medication changes indicated:Lubiprostone 8 mcg- 1 capsule daily added 10/24/2020. Lubiprostone 8 mcg discontinued do to insurance no longer covering, Linzess 145 mcg- 1 tablet daily started. Pravastatin 80 mg changed to 40 mg.   BP Readings from Last 3 Encounters:  11/04/20 (!) 172/84  10/10/20 (!) 164/81  09/30/20 126/82    Lab Results  Component Value Date   HGBA1C 10.5 (H) 07/07/2020     Patient obtains medications through Adherence Packaging  90 Days   Last adherence delivery included:None, reviewed medications with patient and she is not in need of any refills at this time.    Patient declined the following medications last month:  Women's 50 Plus Multivitamin- 1 tablet daily (breakfast) Myrbetriq 50 mg- 1 tablet daily (breakfast) Spironolactone 50 mg- 1 tablet daily (breakfast)  Amlodipine 10 mg- one tablet daily (bedtime)  Pravastatin 80 mg- 1/2 tablet daily (bedtime)  Amitiza 8 mg- 1 capsule daily (breakfast) Ergocalciferol 50,000 units- 1 capsule weekly on Wednesdays                                     Due to receiving a 90 day supply on 08/05/2020.   Tresiba FlexTouch- Inject 18 units into skin daily due to receiving a 50 day supply on 08/16/2020. Patient states she will use 18-20 units daily depending on her dinner readings, she has 3 pens left.   Gabapentin 300 mg- 1 capsule twice daily due to discontinuing medication. Patient states she doesn't feel like medication was helping so she stopped taking.    Novofine 32G x 80mm needles due to having an adequate supply.   Methocarbamol 750 mg- due to being discontinued. Pravastatin 80 mg due to medication being reduced to 40 mg.   Patient is due for next adherence delivery on: 11/07/2020. Called patient and reviewed medications and coordinated delivery.  This delivery to include: Women's 50 Plus Multivitamin- 1 tablet daily (breakfast) Myrbetriq 50 mg- 1 tablet daily (breakfast) Spironolactone 50 mg- 1 tablet daily (breakfast) Amlodipine 10 mg- one tablet daily (bedtime)   No short fill or acute fill needed prior to delivery  Patient declined the following medications: Pravastatin 40 mg- 1/2 tablet daily (bedtime) Ergocalciferol 50,000 units- 1 capsule weekly on Wednesdays     Due to receiving a 90-day supply on 10/25/2020 Ozempic 1 mg- Inject 1mg  into skin once weekly Farxiga 10 gm- 1 tablet daily (before breakfast)    Due to receiving a 90-day supply on 09/05/2020 Amitiza 8 mg- 1 capsule daily due to being discontinued, no insurance coverage, replaced with Linzess.  Tresiba FlexTouch- Inject 18 units into skin daily due to receiving a 50-day supply on 08/16/2020. Patient states she will use 18-20 units daily depending on her dinner readings, she has 2 pens left. Gabapentin 300 mg- 1 capsule twice daily due to discontinuing medication. Patient states she doesn't feel like medication was helping so she stopped taking.  Novofine 32G x 66mm needles due to having an adequate supply.  Methocarbamol 750 mg- due to being discontinued. Pravastatin 80 mg due  to medication being reduced to 40 mg.  Humalog Kwikpen- Inject 25 units sq at breakfast and 35 units sq at lunch due to receiving a 50 day supply on 10/12/2020.   Patient needs refills for None.  Patient would like to do 30 day supply to help sync other medications. She loves the packaging system and her meds should be stable now to get them all at once.   Coordinated future fill for Linzess 145 mcg to be delivered on 11/23/2020- short supply to sync with  12/04/2020 medications.  Confirmed delivery date of 11/07/2020, advised patient that pharmacy will contact them the morning of delivery.  Medications: Outpatient Encounter Medications as of 11/01/2020  Medication Sig   AMITIZA 8 MCG capsule TAKE ONE CAPSULE BY MOUTH EVERY MORNING   amLODipine (NORVASC) 10 MG tablet TAKE ONE TABLET BY MOUTH EVERYDAY AT BEDTIME   Continuous Blood Gluc Receiver (FREESTYLE LIBRE READER) DEVI 1 each by Does not apply route See admin instructions. CGM device-Freestyle Libre   Continuous Blood Gluc Sensor (FREESTYLE LIBRE 14 DAY SENSOR) MISC 1 each by Does not apply route See admin instructions. CGM 14-day sensors; send refills to [facility] (in Epic)   docusate sodium (COLACE) 100 MG capsule Take 100 mg by mouth daily as needed for mild constipation.    FARXIGA 5 MG TABS tablet TAKE TWO TABLETS BY MOUTH BEFORE BREAKFAST   gabapentin (NEURONTIN) 300 MG capsule TAKE ONE CAPSULE BY MOUTH TWICE DAILY (Patient not taking: No sig reported)   HUMALOG 100 UNIT/ML injection INJECT 25 UNITS into THE SKIN WITH BREAKFAST AND SUPPER AND 35 UNITS WITH LUNCH   insulin degludec (TRESIBA FLEXTOUCH) 100 UNIT/ML FlexTouch Pen Inject 20 Units into the skin daily.   insulin lispro (HUMALOG KWIKPEN) 100 UNIT/ML KwikPen Take 25 units [place] breakfast and dinner and 30 units at lunch   Insulin Pen Needle (BD PEN NEEDLE MICRO U/F) 32G X 6 MM MISC DX:E11.65 USE TO CHECK BLOOD SUGARS THREE TIMES A DAY   Lancets Misc. MISC 1 each by Does not apply route 4 (four) times daily -  with meals and at bedtime.   linaclotide (LINZESS) 145 MCG CAPS capsule Take 1 capsule (145 mcg total) by mouth daily before breakfast.   mirabegron ER (MYRBETRIQ) 50 MG TB24 tablet Take 1 tablet (50 mg total) by mouth daily.   mometasone-formoterol (DULERA) 100-5 MCG/ACT AERO Inhale 2 puffs into the lungs as needed for wheezing.    Multiple Vitamin (MULTIVITAMIN WITH MINERALS) TABS tablet Take 1 tablet by mouth daily.    ondansetron (ZOFRAN) 4 MG tablet Take 1 tablet (4 mg total) by mouth every 6 (six) hours as needed for nausea or vomiting. (Patient not taking: Reported on 09/30/2020)   OZEMPIC, 1 MG/DOSE, 4 MG/3ML SOPN INJECT 1mg  into THE SKIN every THURSDAY   pantoprazole (PROTONIX) 40 MG tablet Take 30- 60 min before your first and last meals of the day (Patient not taking: Reported on 09/30/2020)   pravastatin (PRAVACHOL) 40 MG tablet TAKE ONE TABLET BY MOUTH EVERYDAY AT BEDTIME   pregabalin (LYRICA) 50 MG capsule Take 1 capsule (50 mg total) by mouth 3 (three) times daily.   spironolactone (ALDACTONE) 50 MG tablet TAKE ONE TABLET BY MOUTH EVERY MORNING   Vitamin D, Ergocalciferol, (DRISDOL) 1.25 MG (50000 UNIT) CAPS capsule TAKE ONE CAPSULE BY MOUTH ONCE WEEKLY ON WEDNESDAY   No facility-administered encounter medications on file as of 11/01/2020.    Star Rating Drugs: Pravastatin 40 mg- Last filled 10/25/2020 for  90 day supply with [facility]  Notes: Patent is checking blood sugars and blood pressures, patient states blood sugars are still better than what they were, 169- fasting am, 189- after breakfast, 244 before lunch, 200- after lunch. Blood pressures are being check daily, recent readings 159/87 and 170/82. Patient admits to eating a lot of canned soup. Patient aware I will relay these readings to [name], CPP to review.   SIG: [name], [place] Pharmacist Assistant [phone]  I have reviewed the care management and care coordination activities outlined in this encounter and I am certifying that I agree with the content of this note. Patients blood pressure readings are elevated will discuss how to properly check BP at next visit, will also schedule office visit with the patient. Total Time 4 minutes   Mayford Knife, [facility] 11/08/20 12:31 PM

## 2020-11-04 ENCOUNTER — Ambulatory Visit (INDEPENDENT_AMBULATORY_CARE_PROVIDER_SITE_OTHER): Payer: Medicare Other | Admitting: Orthopaedic Surgery

## 2020-11-04 VITALS — BP 172/84 | HR 84 | Ht 61.8 in | Wt 151.0 lb

## 2020-11-04 DIAGNOSIS — Z9889 Other specified postprocedural states: Secondary | ICD-10-CM

## 2020-11-06 ENCOUNTER — Other Ambulatory Visit: Payer: Self-pay | Admitting: Nurse Practitioner

## 2020-11-06 DIAGNOSIS — Z794 Long term (current) use of insulin: Secondary | ICD-10-CM | POA: Diagnosis not present

## 2020-11-06 DIAGNOSIS — E1165 Type 2 diabetes mellitus with hyperglycemia: Secondary | ICD-10-CM

## 2020-11-06 DIAGNOSIS — E118 Type 2 diabetes mellitus with unspecified complications: Secondary | ICD-10-CM | POA: Diagnosis not present

## 2020-11-07 NOTE — Progress Notes (Signed)
Office Visit Note   Patient: [name]           Date of Birth: 1951/04/28           MRN: [id number] Visit Date: 11/04/2020              Requested by: [name], [address],  [place] [address] PCP: [name], FNP   Assessment & Plan: Visit Diagnoses:  1. S/P lumbar laminectomy     Plan: Improvement post epidural injection for recurrent disc protrusion post right L4-5 microdiscectomy on 02/19/2020.  We will recheck her in 1 month.  Consider possible repeat epidural in a couple months.  We discussed operative intervention options for her as well.  Questions were elicited and answered.  Recheck 1 month.  Follow-Up Instructions: Return in about 1 month (around 12/05/2020).   Orders:  No orders of the defined types were placed in this encounter.  No orders of the defined types were placed in this encounter.     Procedures: No procedures performed   Clinical Data: No additional findings.   Subjective: Chief Complaint  Patient presents with  . Lower Back - Follow-up    HPI 70 year old female returns post epidural for recurrent disc protrusion on the right at L4-5.  There is some residual recurrent cranial migrated subarticular foraminal extrusion with moderate to severe subarticular stenosis.  Patient states her pain is not as intense and she is walking better since the injection.  She still has back pain and some tingling that radiates down her leg to her foot.  She is sleeping better.  Review of Systems updated unchanged.   Objective: Vital Signs: BP (!) 172/84   Pulse 84   Ht 5' 1.8" (1.57 m)   Wt 151 lb (68.5 kg)   LMP  (LMP Unknown)   BMI 27.80 kg/m   Physical Exam Constitutional:      Appearance: She is well-developed.  HENT:     Head: Normocephalic.     Right Ear: External ear normal.     Left Ear: External ear normal.  Eyes:     Pupils: Pupils are equal, round, and reactive to light.  Neck:     Thyroid: No  thyromegaly.     Trachea: No tracheal deviation.  Cardiovascular:     Rate and Rhythm: Normal rate.  Pulmonary:     Effort: Pulmonary effort is normal.  Abdominal:     Palpations: Abdomen is soft.  Skin:    General: Skin is warm and dry.  Neurological:     Mental Status: She is alert and oriented to person, place, and time.  Psychiatric:        Behavior: Behavior normal.     Ortho Exam negative straight leg raising to 90degrees on the left.  Some pain with straight leg raising on the right at 70 degrees.  Anterior tib EHL is intact no atrophy.  Distal pulses are 2+ and palpable.  Lumbar incisions well-healed. Specialty Comments:  No specialty comments available.  Imaging: CLINICAL DATA:  Status post lumbar laminectomy. Post laminectomy syndrome, not elsewhere classified. Low back pain, prior surgery, new symptoms; rule out recurrent disc herniation. Additional history provided by scanning technologist: Patient reports central lumbar and posterior bilateral leg weakness and numbness, symptoms for 1 month.  EXAM: MRI LUMBAR SPINE WITHOUT AND WITH CONTRAST  TECHNIQUE: Multiplanar and multiecho pulse sequences of the lumbar spine were obtained without and with intravenous contrast.  CONTRAST:  28mL MULTIHANCE GADOBENATE DIMEGLUMINE 529 MG/ML IV SOLN  COMPARISON:  Radiographs of the lumbar spine 02/19/2020. Lumbar spine MRI 02/13/2020.  FINDINGS: Segmentation: For the purposes of this dictation, five lumbar vertebrae are assumed and the caudal most well-formed intervertebral disc is designated L5-S1. Spinal numbering will remain consistent with that utilized on the prior lumbar spine MRI of 02/13/2020.  Alignment:  Trace L4-L5 grade 1 anterolisthesis.  Vertebrae: Vertebral body height is maintained. Mild degenerative edema within the L4 and L5 posterior elements. No significant marrow edema identified elsewhere. No focal suspicious osseous lesion.  Conus  medullaris and cauda equina: Conus extends to the L1-L2 level. No signal abnormality within the visualized distal spinal cord.  Paraspinal and other soft tissues: No abnormality identified within included portions of the abdomen/retroperitoneum. Postsurgical changes to the dorsal lumbar soft tissues at L4-L5. Atrophy of the lumbar paraspinal musculature on the right within the lower lumbar spine.  Disc levels:  Unless otherwise stated, the level by level findings below have not significantly changed since prior MRI 02/13/2020.  Moderate disc degeneration at L5-S1. No more than mild disc degeneration at the remaining levels.  T11-T12: Small disc bulge. No significant spinal canal or foraminal stenosis  No significant disc herniation, spinal canal stenosis or neural foraminal narrowing at the T12-L1 through L2-L3 levels.  L3-L4: Small disc bulge. Mild facet arthrosis and ligamentum flavum hypertrophy. No significant spinal canal or foraminal stenosis.  L4-L5: Interval right laminectomy and discectomy. Disc bulge. Residual/recurrent cranially migrated right subarticular/foraminal disc extrusion. Advanced facet arthrosis. Ligamentum flavum hypertrophy. The disc extrusion contributes to moderate/severe right subarticular stenosis with encroachment upon the descending right L5 nerve root (series 9, image 28). It also contributes to severe right foraminal stenosis with likely impingement of the exiting right L4 nerve root (series 12, image 4). No significant left subarticular or central canal stenosis. Unchanged mild left neural foraminal narrowing. Small right facet joint effusion, new.  L5-S1: Disc bulge with endplate spurring. Superimposed broad-based right center/subarticular disc protrusion. Moderate facet arthrosis with mild ligamentum flavum hypertrophy. As before, the disc protrusion contributes to mild right subarticular narrowing with possible contact upon the  descending right S1 nerve root. Central canal patent. Bilateral neural foraminal narrowing (mild right, moderate left).  IMPRESSION: At L4-L5, there has been interval right laminectomy and discectomy. A residual/recurrent cranially migrated right subarticular/foraminal disc extrusion contributes to multifactorial moderate/severe right subarticular stenosis with encroachment upon the descending right L5 nerve root. It also contributes to severe right neural foraminal narrowing with likely impingement of the exiting right L4 nerve root. Unchanged mild left neural foraminal narrowing. A small right facet joint effusion is new from the prior exam.  Lumbar spondylosis is otherwise unchanged as compared to the MRI of 02/13/2020, as detailed above.   Electronically Signed   By: [name] DO   On: 07/31/2020 15:48   PMFS History: Patient Active Problem List   Diagnosis Date Noted  . Irritable bowel syndrome with diarrhea 10/10/2020  . Gastroesophageal reflux disease with esophagitis 10/10/2020  . Postlaminectomy syndrome, not elsewhere classified 07/03/2020  . S/P lumbar laminectomy 03/30/2020  . Localized swelling, mass and lump, multiple sites 03/27/2018  . Type II diabetes mellitus with renal manifestations ([place]) 12/27/2017  . HLD (hyperlipidemia) 12/27/2017  . HTN (hypertension) 12/27/2017  . CKD (chronic kidney disease), stage III ([place]) 12/27/2017  . Cough variant asthma 12/01/2015  . Osteoarthritis of left knee, primary localized 08/17/2014  . Knee osteoarthritis 08/17/2014  . Hypoglycemia 11/15/2013  . Other  and unspecified hyperlipidemia 08/06/2013  . Type II diabetes mellitus, uncontrolled ([place]) 07/20/2013  . Varicose veins of lower extremities with other complications 93/26/7124  . Fibromyalgia   . GERD 05/18/2010  . ABDOMINAL PAIN, GENERALIZED 05/02/2010  . OBESITY 04/20/2010  . BURSITIS, LEFT SHOULDER 03/09/2010  . Lipoma of arm s/p excision 01/29/2014  01/10/2010  . DEPRESSION 12/27/2009  . BACK PAIN WITH RADICULOPATHY 12/27/2009  . CYSTITIS, ACUTE 10/21/2009  . MICROSCOPIC HEMATURIA 10/18/2009  . KNEE PAIN, BILATERAL 10/18/2009  . NEUROPATHY 09/08/2009  . Asthma 09/08/2009  . STRESS INCONTINENCE 09/08/2009  . CHEST PAIN 07/19/2009  . ESOPHAGEAL STRICTURE 08/27/2005  . Diabetes mellitus without complication ([place]) 58/04/9832   Past Medical History:  Diagnosis Date  . Aortic stenosis    mild on 04/07/19 echo  . Arthritis    L knee, hands, back   . Asthma   . Diabetes mellitus type 2, insulin dependent ([place])   . Fibromyalgia   . Full dentures   . Full dentures   . GERD (gastroesophageal reflux disease)   . H/O hiatal hernia   . Headache    h/o migraines - was followed for a time with a wellness doctor  . History of esophageal dilatation   . History of rhabdomyolysis    03/ 2015  . Hyperlipidemia   . Hypertension   . Insulin pump in place    since 12/ 2014--  [employer]  . Lumbar disc herniation    right leg weakness  . Osteoarthritis of left knee, primary localized 08/17/2014  . 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  . Renal insufficiency   . Rhabdomyolysis 11/15/2013  . SUI (stress urinary incontinence, female)   . Syncope 12/27/2017  . Wears glasses   . Wears glasses     Family History  Problem Relation Age of Onset  . Diabetes Mother   . Hypertension Mother   . Lung cancer Mother        smoked  . Diabetes Father   . Hypertension Father   . Lung cancer Father        smoked  . Diabetes Brother   . Heart failure Sister   . Diabetes Maternal Grandmother   . Cancer Maternal Grandmother     Past Surgical History:  Procedure Laterality Date  . BLADDER SUSPENSION N/A 03/09/2014   Procedure: CYSTOSCOPY/SLING;  Surgeon: Reece Packer, MD;  [facility];  Service: Urology;  Laterality: N/A;  . CARDIAC CATHETERIZATION  07-20-2009   DR Shelva Majestic   MODERATE LVH/  NORMAL  LVEF/  NORMAL CORONARY AND  RENAL ARTERIES  . CARPAL TUNNEL RELEASE Right 2000  . CHOLECYSTECTOMY  1990  . COLONOSCOPY WITH ESOPHAGOGASTRODUODENOSCOPY (EGD)  06-18-2002  . ESOPHAGOGASTRODUODENOSCOPY (EGD) WITH ESOPHAGEAL DILATION  06-12-2010  . EXCISION LEFT UPPER ARM MASS  01-29-2014  . EYE SURGERY  2010   laser on R eye  . INTERSTIM IMPLANT REMOVAL N/A 03/06/2019   Procedure: REMOVAL OF INTERSTIM IMPLANT;  Surgeon: Bjorn Loser, MD;  Location: [facility];  Service: Urology;  Laterality: N/A;  . KNEE ARTHROSCOPY Right 1989  &  2002  . LUMBAR LAMINECTOMY N/A 02/19/2020   Procedure: right L4 hemiaminectomy, microdiscectomy;  Surgeon: Marybelle Killings, MD;  Location: [place];  Service: Orthopedics;  Laterality: N/A;  . MULTIPLE TOOTH EXTRACTIONS    . NEGATIVE SLEEP STUDY  2012   PER PT  . PARTIAL KNEE ARTHROPLASTY Left 08/17/2014   Procedure: LEFT UNICOMPARTMENTAL KNEE;  Surgeon: Johnny Bridge, MD;  Location: [place];  Service: Orthopedics;  Laterality: Left;   Social History   Occupational History  . Occupation: disability  Tobacco Use  . Smoking status: Never Smoker  . Smokeless tobacco: Never Used  Vaping Use  . Vaping Use: Never used  Substance and Sexual Activity  . Alcohol use: No  . Drug use: No  . Sexual activity: Not Currently

## 2020-11-16 ENCOUNTER — Ambulatory Visit (INDEPENDENT_AMBULATORY_CARE_PROVIDER_SITE_OTHER): Payer: Medicare Other

## 2020-11-16 ENCOUNTER — Telehealth: Payer: Medicare Other

## 2020-11-16 DIAGNOSIS — E785 Hyperlipidemia, unspecified: Secondary | ICD-10-CM

## 2020-11-16 DIAGNOSIS — E1165 Type 2 diabetes mellitus with hyperglycemia: Secondary | ICD-10-CM | POA: Diagnosis not present

## 2020-11-16 DIAGNOSIS — I10 Essential (primary) hypertension: Secondary | ICD-10-CM | POA: Diagnosis not present

## 2020-11-16 DIAGNOSIS — Z9889 Other specified postprocedural states: Secondary | ICD-10-CM

## 2020-11-24 ENCOUNTER — Other Ambulatory Visit: Payer: Self-pay | Admitting: Nurse Practitioner

## 2020-11-24 DIAGNOSIS — E1165 Type 2 diabetes mellitus with hyperglycemia: Secondary | ICD-10-CM

## 2020-11-25 NOTE — Patient Instructions (Signed)
Goals Addressed    . Keep Low Back Pain Under Control   On track    Timeframe:  Long-Range Goal Priority:  High Start Date: 08/02/20                            Expected End Date:  03/02/21                     Follow Up Date: 01/09/21    - call for medicine refill 2 or 3 days before it runs out - plan exercise or activity when pain is best controlled - prioritize tasks for the day - stay active - track times pain is worst and when it is best - track what makes the pain worse and what makes it better - work slower and less intense when having pain    Why is this important?    Day-to-day life can be hard when you have back pain.   Pain medicine is just one piece of the treatment puzzle. There are many things you can do to manage pain and keep your back strong.    Lifestyle changes, like stopping smoking and eating foods with Vitamin D and calcium, keep your bones and muscles healthy. Your back is better when it is supported by strong muscles.   You can try these action steps to help you manage your pain.     Notes:     [name] Monitor and Manage My Blood Sugar-Diabetes Type 2   On track    Timeframe:  Long-Range Goal Priority:  High Start Date: 08/02/20                            Expected End Date: 01/31/21                      Follow Up Date: 01/09/21   - check blood sugar at prescribed times - check blood sugar before and after exercise - check blood sugar if I feel it is too high or too low - enter blood sugar readings and medication or insulin into daily log - take the blood sugar log to all doctor visits - take the blood sugar meter to all doctor visits    Why is this important?    Checking your blood sugar at home helps to keep it from getting very high or very low.   Writing the results in a diary or log helps the doctor know how to care for you.   Your blood sugar log should have the time, date and the results.   Also, write down the amount of insulin or other medicine  that you take.   Other information, like what you ate, exercise done and how you were feeling, will also be helpful.     Notes:     . Obtain Eye Exam-Diabetes Type 2       Timeframe:  Long-Range Goal Priority:  Medium Start Date:  08/02/21                           Expected End Date: 01/31/21                       Follow Up Date: 01/09/21    - schedule appointment with eye doctor    Why is this important?  Eye check-ups are important when you have diabetes.   Vision loss can be prevented.    Notes:     . Perform Foot Care-Diabetes Type 2       Timeframe:  Long-Range Goal Priority:  Medium Start Date:  08/02/20                           Expected End Date: 02/09/20                       Follow Up Date: 01/09/21   - check feet daily for cuts, sores or redness - do heel pump exercise 2 to 3 times each day - keep feet up while sitting - trim toenails straight across - wash and dry feet carefully every day - wear comfortable, cotton socks - wear comfortable, well-fitting shoes    Why is this important?    Good foot care is very important when you have diabetes.   There are many things you can do to keep your feet healthy and catch a problem early.    Notes:     . Stay Active and Independent-Low Back Pain   On track    Timeframe:  Long-Range Goal Priority:  High Start Date: 08/02/20                            Expected End Date: 01/31/21                      Follow Up Date: 01/09/21  - plan exercise or activity when pain is best controlled - prioritize tasks for the day - stay active - check with my doctor before adding more exercise than I usually do - choose a type of activity I enjoy  - work slower and less intense when having pain    Why is this important?    Regular activity or exercise is important to managing back pain.   Activity helps to keep your muscles strong.   You will sleep better and feel more relaxed.   You will have more energy and feel less  stressed.   If you are not active now, start slowly. Maycen Degregory changes make a big difference.   Rest, but not too much.   Stay as active as you can and listen to your body's signals.     Notes:

## 2020-11-25 NOTE — Chronic Care Management (AMB) (Signed)
Chronic Care Management   CCM RN Visit Note  11/16/2020 Name: [name] MRN: [id number] DOB: Aug 10, 1951  Subjective: [name] is a 70 y.o. year old female who is a primary care patient of [name], Frannie. The care management team was consulted for assistance with disease management and care coordination needs.    Engaged with patient by telephone for follow up visit in response to provider referral for case management and/or care coordination services.   Consent to Services:  The patient was given information about Chronic Care Management services, agreed to services, and gave verbal consent prior to initiation of services.  Please see initial visit note for detailed documentation.   Patient agreed to services and verbal consent obtained.   Assessment: Review of patient past medical history, allergies, medications, health status, including review of consultants reports, laboratory and other test data, was performed as part of comprehensive evaluation and provision of chronic care management services.   SDOH (Social Determinants of Health) assessments and interventions performed:  Yes, no acute challenges   CCM Care Plan  Allergies  Allergen Reactions  . Pollen Extract Other (See Comments)    Congestion/sneezing  . Tomato Hives and Itching  . Codeine Hives, Itching and Nausea And Vomiting    Outpatient Encounter Medications as of 11/16/2020  Medication Sig  . AMITIZA 8 MCG capsule TAKE ONE CAPSULE BY MOUTH EVERY MORNING  . amLODipine (NORVASC) 10 MG tablet TAKE ONE TABLET BY MOUTH EVERYDAY AT BEDTIME  . Continuous Blood Gluc Receiver (FREESTYLE LIBRE READER) DEVI 1 each by Does not apply route See admin instructions. CGM device-Freestyle Libre  . Continuous Blood Gluc Sensor (FREESTYLE LIBRE 14 DAY SENSOR) MISC 1 each by Does not apply route See admin instructions. CGM 14-day sensors; send refills to [facility] (in [place])  . docusate sodium (COLACE) 100  MG capsule Take 100 mg by mouth daily as needed for mild constipation.   . gabapentin (NEURONTIN) 300 MG capsule TAKE ONE CAPSULE BY MOUTH TWICE DAILY (Patient not taking: No sig reported)  . HUMALOG 100 UNIT/ML injection INJECT 25 UNITS into THE SKIN WITH BREAKFAST AND SUPPER AND 35 UNITS WITH LUNCH  . insulin degludec (TRESIBA FLEXTOUCH) 100 UNIT/ML FlexTouch Pen Inject 20 Units into the skin daily.  . insulin lispro (HUMALOG KWIKPEN) 100 UNIT/ML KwikPen INJECT 25 UNITS SUBCUTANEOUSLY AT BREAKFAST AND dinner AND INJECT 35 UNITS AT LUNCH  . Insulin Pen Needle (BD PEN NEEDLE MICRO U/F) 32G X 6 MM MISC DX:E11.65 USE TO CHECK BLOOD SUGARS THREE TIMES A DAY  . Lancets Misc. MISC 1 each by Does not apply route 4 (four) times daily -  with meals and at bedtime.  [name] linaclotide (LINZESS) 145 MCG CAPS capsule Take 1 capsule (145 mcg total) by mouth daily before breakfast.  . mirabegron ER (MYRBETRIQ) 50 MG TB24 tablet Take 1 tablet (50 mg total) by mouth daily.  . mometasone-formoterol (DULERA) 100-5 MCG/ACT AERO Inhale 2 puffs into the lungs as needed for wheezing.   . Multiple Vitamin (MULTIVITAMIN WITH MINERALS) TABS tablet Take 1 tablet by mouth daily.  . ondansetron (ZOFRAN) 4 MG tablet Take 1 tablet (4 mg total) by mouth every 6 (six) hours as needed for nausea or vomiting. (Patient not taking: Reported on 09/30/2020)  . OZEMPIC, 1 MG/DOSE, 4 MG/3ML SOPN INJECT 1mg  into THE SKIN every THURSDAY  . pantoprazole (PROTONIX) 40 MG tablet Take 30- 60 min before your first and last meals of the day (Patient not taking:  Reported on 09/30/2020)  . pravastatin (PRAVACHOL) 40 MG tablet TAKE ONE TABLET BY MOUTH EVERYDAY AT BEDTIME  . pregabalin (LYRICA) 50 MG capsule Take 1 capsule (50 mg total) by mouth 3 (three) times daily.  [name] spironolactone (ALDACTONE) 50 MG tablet TAKE ONE TABLET BY MOUTH EVERY MORNING  . Vitamin D, Ergocalciferol, (DRISDOL) 1.25 MG (50000 UNIT) CAPS capsule TAKE ONE CAPSULE BY MOUTH ONCE WEEKLY  ON WEDNESDAY  . [DISCONTINUED] FARXIGA 5 MG TABS tablet TAKE TWO TABLETS BY MOUTH BEFORE BREAKFAST   No facility-administered encounter medications on file as of 11/16/2020.    Patient Active Problem List   Diagnosis Date Noted  . Irritable bowel syndrome with diarrhea 10/10/2020  . Gastroesophageal reflux disease with esophagitis 10/10/2020  . Postlaminectomy syndrome, not elsewhere classified 07/03/2020  . S/P lumbar laminectomy 03/30/2020  . Localized swelling, mass and lump, multiple sites 03/27/2018  . Type II diabetes mellitus with renal manifestations ([place]) 12/27/2017  . HLD (hyperlipidemia) 12/27/2017  . HTN (hypertension) 12/27/2017  . CKD (chronic kidney disease), stage III ([place]) 12/27/2017  . Cough variant asthma 12/01/2015  . Osteoarthritis of left knee, primary localized 08/17/2014  . Knee osteoarthritis 08/17/2014  . Hypoglycemia 11/15/2013  . Other and unspecified hyperlipidemia 08/06/2013  . Type II diabetes mellitus, uncontrolled ([place]) 07/20/2013  . Varicose veins of lower extremities with other complications 12/75/1700  . Fibromyalgia   . GERD 05/18/2010  . ABDOMINAL PAIN, GENERALIZED 05/02/2010  . OBESITY 04/20/2010  . BURSITIS, LEFT SHOULDER 03/09/2010  . Lipoma of arm s/p excision 01/29/2014 01/10/2010  . DEPRESSION 12/27/2009  . BACK PAIN WITH RADICULOPATHY 12/27/2009  . CYSTITIS, ACUTE 10/21/2009  . MICROSCOPIC HEMATURIA 10/18/2009  . KNEE PAIN, BILATERAL 10/18/2009  . NEUROPATHY 09/08/2009  . Asthma 09/08/2009  . STRESS INCONTINENCE 09/08/2009  . CHEST PAIN 07/19/2009  . ESOPHAGEAL STRICTURE 08/27/2005  . Diabetes mellitus without complication ([place]) 17/49/4496    Conditions to be addressed/monitored:DMII, Hypertensive Nephropathy, CKD stage II, Vitamin D deficiency  Care Plan : Manage low back pain  Updates made by [name], RN since 11/25/2020 12:00 AM    Problem: Chronic Low Back Pain   Priority: High    Long-Range Goal: Chronic Low Back  Pain Managed   Start Date: 08/02/2020  Expected End Date: 01/31/2021  Recent Progress: On track  Priority: High  Note:   Current Barriers:   Ineffective Self Health Maintenance  Currently UNABLE TO independently self manage needs related to chronic health conditions.   Knowledge Deficits related to short term plan for care coordination needs and long term plans for chronic disease management needs Nurse Case Manager Clinical Goal(s):   Over the next 180 days, patient will work with care management team to address care coordination and chronic disease management needs related to Disease Management  Educational Needs  Care Coordination  Medication Management and Education  Psychosocial Support   Interventions:  11/16/20 successful call completed with patient   Encouraged use of multimodal interventions, such as physical therapy, distraction,  relaxation, and or application of heat or cold.   Evaluated pain level, efficacy, tolerability and adherence to pain management plan.   Assessed if pain is associated with mobility or at rest, location, intensity, frequency, duration, recurrence, pattern and description (e.g., cramping, burning, aching), triggers and relieving factors.    Determined patient continues to follow up with Orthopedics for management of her back pain   Reviewed and discussed next scheduled f/u with Orthopedics set for 12/02/20 at which time patient will  be re-evaluated for repeat steroid injection   Educated on importance to continue adherence to HEP as directed   Discussed plans with patient for ongoing care management follow up and provided patient with direct contact information for care management team Patient Goals/Self Care/Activities:  - call for medicine refill 2 or 3 days before it runs out  - plan exercise or activity when pain is best controlled  - prioritize tasks for the day  - stay active  - track times pain is worst and when it is best  -  track what makes the pain worse and what makes it better  - work slower and less intense when having pain   Follow Up Plan: Telephone follow up appointment with care management team member scheduled for:  01/09/21    Problem: Functional Decline   Priority: High    Long-Range Goal: Maintain Mobility and Function   Start Date: 08/02/2020  Expected End Date: 01/31/2021  Recent Progress: On track  Priority: High  Note:   Current Barriers:   Ineffective Self Health Maintenance  Currently UNABLE TO independently self manage needs related to chronic health conditions.   Knowledge Deficits related to short term plan for care coordination needs and long term plans for chronic disease management needs Nurse Case Manager Clinical Goal(s):   Over the next 180 days, patient will work with care management team to address care coordination and chronic disease management needs related to Disease Management  Educational Needs  Care Coordination  Medication Management and Education  Psychosocial Support   Interventions: 11/16/20 call completed with patient    Emphasized the importance of physical activity and aerobic exercise as included in treatment plan; assess barriers to adherence; consider patient's abilities and preferences.   Encouraged gradual increase in activity or exercise instead of stopping if pain occurs.   Reinforced individual therapy exercise prescription, such as strengthening, stabilization and stretching programs.   Encouraged activity and mobility modifications to facilitate optimal function, such as using a log roll for bed mobility or dressing from a seated position.   Reinforced individual adaptive equipment recommendations to limit excessive spinal movements, such as a Systems analyst.   Discussed plans with patient for ongoing care management follow up and provided patient with direct contact information for care management team Patient Goals/Self  Care/Activities: - plan exercise or activity when pain is best controlled - prioritize tasks for the day - stay active - check with my doctor before adding more exercise than I usually do - choose a type of activity I enjoy  - work slower and less intense when having pain   Follow Up Plan: Telephone follow up appointment with care management team member scheduled for: 01/09/21    Care Plan : Manage Diabetes Type 2 (Adult)  Updates made by [name], RN since 11/25/2020 12:00 AM    Problem: Glycemic Management (Diabetes, Type 2)   Priority: High    Long-Range Goal: Glycemic Management Optimized   Start Date: 08/02/2020  Expected End Date: 01/31/2021  Recent Progress: On track  Priority: High  Note:   Objective:  Lab Results  Component Value Date   HGBA1C 10.5 (H) 07/07/2020 .   Lab Results  Component Value Date   CREATININE 1.15 (H) 07/07/2020   CREATININE 1.24 (H) 05/03/2020   CREATININE 1.21 (H) 02/19/2020   Current Barriers:  [name] Knowledge Deficits related to basic Diabetes pathophysiology and self care/management . Knowledge Deficits related to medications used for management of diabetes Case Manager  Clinical Goal(s):  [name] Over the next 180 days, patient will demonstrate improved adherence to prescribed treatment plan for diabetes self care/management as evidenced by:  . daily monitoring and recording of CBG  . adherence to ADA/ carb modified diet . exercise 3-5 days/week . adherence to prescribed medication regimen Interventions:  11/16/20 completed successful outbound call with patient  . Provided education to patient about basic DM disease process . Reviewed medications with patient and discussed importance of medication adherence . Educated patient on importance of monitoring CBG's closely following lumbar spinal steroid injections and alert PCP for abnormally high readings that do not respond to sliding scale regimen  . Advised patient, providing education and  rationale, to check cbg 2-4 and record, calling the CCM team and or PCP for findings outside established parameters.    Counseled patient to anticipate A1C testing (point-of-care) every 3 to 6 months based on goal attainment.   Reviewed mutually-set A1C goal or target range  Educated on use of antihyperglycemic with or without insulin and periodic adjustments; consider active involvement of pharmacist.   Compared self-reported symptoms of hypo or hyperglycemia to blood glucose levels, diet and fluid intake, current medications, psychosocial and physiologic stressors, change in activity and barriers to care adherence.   Promoted self-monitoring of blood glucose levels.   Assessed and address barriers to management plan, such as food insecurity, age, developmental ability, depression, anxiety, fear of hypoglycemia or weight gain, as well as medication cost, side effects and complicated regimen.   Encouraged regular dental care for treatment of periodontal disease; refer to dental provider when needed.    Discussed plans with patient for ongoing care management follow up and provided patient with direct contact information for care management team Patient Goals/Self-Care Activities . Over the next 180 days, patient will:  - Self administers oral medications as prescribed Self administers insulin as prescribed Self administers injectable DM medication (Ozempic) as prescribed Attends all scheduled provider appointments Checks blood sugars as prescribed and utilize hyper and hypoglycemia protocol as needed Adheres to prescribed ADA/carb modified  Follow Up Plan: Telephone follow up appointment with care management team member scheduled for: 01/09/21     Problem: Disease Progression (Diabetes, Type 2)   Priority: High    Long-Range Goal: Disease Progression Prevented or Minimized   Start Date: 08/02/2020  Expected End Date: 01/31/2021  Recent Progress: On track  Priority: High  Note:    Objective:  Lab Results  Component Value Date   HGBA1C 10.5 (H) 07/07/2020 .   Lab Results  Component Value Date   CREATININE 1.15 (H) 07/07/2020   CREATININE 1.24 (H) 05/03/2020   CREATININE 1.21 (H) 02/19/2020   Current Barriers:  [name] Knowledge Deficits related to basic Diabetes pathophysiology and self care/management Case Manager Clinical Goal(s):  [name] Over the next 180 days, patient will demonstrate improved adherence to prescribed treatment plan for diabetes self care/management as evidenced by:  . daily monitoring and recording of CBG  . adherence to ADA/ carb modified diet . exercise 3-5 days/week . adherence to prescribed medication regimen Interventions:  11/16/20 completed call with patient  . Provided education to patient about basic DM disease process . Reviewed medications with patient and discussed importance of medication adherence  Ensured completion of annual comprehensive foot exam and dilated eye exam.    Implement additional individualized goals and interventions based on identified risk factors.   Encouraged lifestyle changes, such as increased intake of plant-based foods, stress reduction, consistent physical activity and smoking  cessation to prevent long-term complications and chronic disease.    Individualized activity and exercise recommendations while considering potential limitations, such as neuropathy, retinopathy or the ability to prevent hyperglycemia or hypoglycemia.    Assessed for signs/symptoms and risk factors for hypertension, sleep-disordered breathing, neuropathy (including changes in gait and balance), retinopathy, nephropathy and sexual dysfunction.   Discussed plans with patient for ongoing care management follow up and provided patient with direct contact information for care management team Patient Goals/Self-Care Activities . Over the next 180 days, patient will:  - Self administers oral medications as prescribed Self administers  insulin as prescribed Self administers injectable DM medication (Ozempic) as prescribed Attends all scheduled provider appointments Checks blood sugars as prescribed and utilize hyper and hypoglycemia protocol as needed Adheres to prescribed ADA/carb modified  Follow Up Plan: Telephone follow up appointment with care management team member scheduled for: 01/09/21      Plan:Telephone follow up appointment with care management team member scheduled for:  01/09/21  [name], RN, BSN, CCM Care Management Coordinator [facility]  Direct Phone: [phone]

## 2020-11-29 ENCOUNTER — Ambulatory Visit (INDEPENDENT_AMBULATORY_CARE_PROVIDER_SITE_OTHER): Payer: Medicare Other | Admitting: Vascular Surgery

## 2020-11-29 ENCOUNTER — Encounter: Payer: Self-pay | Admitting: Vascular Surgery

## 2020-11-29 ENCOUNTER — Other Ambulatory Visit: Payer: Self-pay

## 2020-11-29 ENCOUNTER — Encounter (HOSPITAL_COMMUNITY): Payer: Self-pay | Admitting: Pharmacy Technician

## 2020-11-29 ENCOUNTER — Observation Stay (HOSPITAL_COMMUNITY)
Admission: EM | Admit: 2020-11-29 | Discharge: 2020-12-01 | Disposition: A | Discharge status: Home / Self Care | Payer: Medicare Other | Attending: Internal Medicine | Admitting: Internal Medicine

## 2020-11-29 ENCOUNTER — Emergency Department (HOSPITAL_COMMUNITY): Payer: Medicare Other

## 2020-11-29 ENCOUNTER — Ambulatory Visit (INDEPENDENT_AMBULATORY_CARE_PROVIDER_SITE_OTHER)
Admission: RE | Admit: 2020-11-29 | Discharge: 2020-11-29 | Disposition: A | Discharge status: Home / Self Care | Payer: Medicare Other | Source: Ambulatory Visit | Attending: Vascular Surgery | Admitting: Vascular Surgery

## 2020-11-29 VITALS — BP 149/87 | HR 93 | Temp 98.6°F | Resp 20 | Ht 61.0 in | Wt 147.0 lb

## 2020-11-29 DIAGNOSIS — Z794 Long term (current) use of insulin: Secondary | ICD-10-CM | POA: Diagnosis not present

## 2020-11-29 DIAGNOSIS — E1165 Type 2 diabetes mellitus with hyperglycemia: Secondary | ICD-10-CM | POA: Diagnosis not present

## 2020-11-29 DIAGNOSIS — I129 Hypertensive chronic kidney disease with stage 1 through stage 4 chronic kidney disease, or unspecified chronic kidney disease: Secondary | ICD-10-CM | POA: Insufficient documentation

## 2020-11-29 DIAGNOSIS — E1122 Type 2 diabetes mellitus with diabetic chronic kidney disease: Secondary | ICD-10-CM | POA: Diagnosis not present

## 2020-11-29 DIAGNOSIS — N1831 Chronic kidney disease, stage 3a: Secondary | ICD-10-CM

## 2020-11-29 DIAGNOSIS — M47812 Spondylosis without myelopathy or radiculopathy, cervical region: Secondary | ICD-10-CM | POA: Diagnosis not present

## 2020-11-29 DIAGNOSIS — R55 Syncope and collapse: Principal | ICD-10-CM | POA: Diagnosis present

## 2020-11-29 DIAGNOSIS — I10 Essential (primary) hypertension: Secondary | ICD-10-CM | POA: Diagnosis not present

## 2020-11-29 DIAGNOSIS — M25569 Pain in unspecified knee: Secondary | ICD-10-CM | POA: Insufficient documentation

## 2020-11-29 DIAGNOSIS — Z79899 Other long term (current) drug therapy: Secondary | ICD-10-CM | POA: Diagnosis not present

## 2020-11-29 DIAGNOSIS — R079 Chest pain, unspecified: Secondary | ICD-10-CM | POA: Diagnosis not present

## 2020-11-29 DIAGNOSIS — I350 Nonrheumatic aortic (valve) stenosis: Secondary | ICD-10-CM | POA: Diagnosis not present

## 2020-11-29 DIAGNOSIS — R1111 Vomiting without nausea: Secondary | ICD-10-CM | POA: Diagnosis not present

## 2020-11-29 DIAGNOSIS — R6889 Other general symptoms and signs: Secondary | ICD-10-CM

## 2020-11-29 DIAGNOSIS — Z20822 Contact with and (suspected) exposure to covid-19: Secondary | ICD-10-CM | POA: Diagnosis not present

## 2020-11-29 DIAGNOSIS — E785 Hyperlipidemia, unspecified: Secondary | ICD-10-CM | POA: Diagnosis not present

## 2020-11-29 DIAGNOSIS — R404 Transient alteration of awareness: Secondary | ICD-10-CM | POA: Diagnosis not present

## 2020-11-29 DIAGNOSIS — N183 Chronic kidney disease, stage 3 unspecified: Secondary | ICD-10-CM | POA: Diagnosis not present

## 2020-11-29 DIAGNOSIS — Z743 Need for continuous supervision: Secondary | ICD-10-CM | POA: Diagnosis not present

## 2020-11-29 DIAGNOSIS — J45909 Unspecified asthma, uncomplicated: Secondary | ICD-10-CM | POA: Insufficient documentation

## 2020-11-29 DIAGNOSIS — E1129 Type 2 diabetes mellitus with other diabetic kidney complication: Secondary | ICD-10-CM | POA: Diagnosis present

## 2020-11-29 LAB — CBC WITH DIFFERENTIAL/PLATELET
Abs Immature Granulocytes: 0.01 10*3/uL (ref 0.00–0.07)
Basophils Absolute: 0 10*3/uL (ref 0.0–0.1)
Basophils Relative: 1 %
Eosinophils Absolute: 0 10*3/uL (ref 0.0–0.5)
Eosinophils Relative: 0 %
HCT: 49.2 % — ABNORMAL HIGH (ref 36.0–46.0)
Hemoglobin: 16.1 g/dL — ABNORMAL HIGH (ref 12.0–15.0)
Immature Granulocytes: 0 %
Lymphocytes Relative: 19 %
Lymphs Abs: 1.4 10*3/uL (ref 0.7–4.0)
MCH: 31.1 pg (ref 26.0–34.0)
MCHC: 32.7 g/dL (ref 30.0–36.0)
MCV: 95 fL (ref 80.0–100.0)
Monocytes Absolute: 0.4 10*3/uL (ref 0.1–1.0)
Monocytes Relative: 5 %
Neutro Abs: 5.5 10*3/uL (ref 1.7–7.7)
Neutrophils Relative %: 75 %
Platelets: 274 10*3/uL (ref 150–400)
RBC: 5.18 MIL/uL — ABNORMAL HIGH (ref 3.87–5.11)
RDW: 12 % (ref 11.5–15.5)
WBC: 7.4 10*3/uL (ref 4.0–10.5)
nRBC: 0 % (ref 0.0–0.2)

## 2020-11-29 LAB — BASIC METABOLIC PANEL
Anion gap: 7 (ref 5–15)
BUN: 17 mg/dL (ref 8–23)
CO2: 30 mmol/L (ref 22–32)
Calcium: 9.6 mg/dL (ref 8.9–10.3)
Chloride: 102 mmol/L (ref 98–111)
Creatinine, Ser: 1.27 mg/dL — ABNORMAL HIGH (ref 0.44–1.00)
GFR, Estimated: 46 mL/min — ABNORMAL LOW (ref >60)
Glucose, Bld: 253 mg/dL — ABNORMAL HIGH (ref 70–99)
Potassium: 3.9 mmol/L (ref 3.5–5.1)
Sodium: 139 mmol/L (ref 135–145)

## 2020-11-29 LAB — TROPONIN I (HIGH SENSITIVITY)
Troponin I (High Sensitivity): 6 ng/L (ref <18)
Troponin I (High Sensitivity): 8 ng/L (ref <18)

## 2020-11-29 LAB — CBG MONITORING, ED: Glucose-Capillary: 203 mg/dL — ABNORMAL HIGH (ref 70–99)

## 2020-11-29 MED ORDER — DOCUSATE SODIUM 100 MG PO CAPS: 100.0000 mg | ORAL_CAPSULE | Freq: Every day | ORAL | Status: DC | PRN

## 2020-11-29 MED ORDER — ACETAMINOPHEN 325 MG PO TABS: 650.0000 mg | ORAL_TABLET | Freq: Four times a day (QID) | ORAL | Status: DC | PRN

## 2020-11-29 MED ORDER — INSULIN ASPART 100 UNIT/ML ~~LOC~~ SOLN
0.0000 [IU] | SUBCUTANEOUS | Status: DC
Start: 2020-11-29 — End: 2020-12-01
  Administered 2020-11-29: 3 [IU] via SUBCUTANEOUS
  Administered 2020-11-30: 2 [IU] via SUBCUTANEOUS

## 2020-11-29 MED ORDER — PRAVASTATIN SODIUM 40 MG PO TABS
40.0000 mg | ORAL_TABLET | Freq: Every day | ORAL | Status: DC
  Administered 2020-11-30: 40 mg via ORAL
  Filled 2020-11-29: qty 1

## 2020-11-29 MED ORDER — MIRABEGRON ER 50 MG PO TB24
50.0000 mg | ORAL_TABLET | Freq: Every day | ORAL | Status: DC
  Administered 2020-11-30 – 2020-12-01 (×2): 50 mg via ORAL
  Filled 2020-11-29 (×2): qty 1

## 2020-11-29 MED ORDER — ONDANSETRON HCL 4 MG PO TABS: 4.0000 mg | ORAL_TABLET | Freq: Four times a day (QID) | ORAL | Status: DC | PRN

## 2020-11-29 MED ORDER — INSULIN GLARGINE 100 UNIT/ML ~~LOC~~ SOLN
15.0000 [IU] | Freq: Every day | SUBCUTANEOUS | Status: DC
  Administered 2020-11-30 – 2020-12-01 (×2): 15 [IU] via SUBCUTANEOUS
  Filled 2020-11-29 (×3): qty 0.15

## 2020-11-29 MED ORDER — ENOXAPARIN SODIUM 40 MG/0.4ML ~~LOC~~ SOLN
40.0000 mg | SUBCUTANEOUS | Status: DC
  Administered 2020-11-29 – 2020-11-30 (×2): 40 mg via SUBCUTANEOUS
  Filled 2020-11-29 (×2): qty 0.4

## 2020-11-29 MED ORDER — AMLODIPINE BESYLATE 5 MG PO TABS
10.0000 mg | ORAL_TABLET | Freq: Every day | ORAL | Status: DC
  Administered 2020-11-29: 10 mg via ORAL
  Filled 2020-11-29: qty 2

## 2020-11-29 MED ORDER — ADULT MULTIVITAMIN W/MINERALS CH
1.0000 | ORAL_TABLET | Freq: Every day | ORAL | Status: DC
  Administered 2020-11-30 – 2020-12-01 (×2): 1 via ORAL
  Filled 2020-11-29 (×3): qty 1

## 2020-11-29 MED ORDER — ONDANSETRON HCL 4 MG/2ML IJ SOLN: 4.0000 mg | Freq: Four times a day (QID) | INTRAMUSCULAR | Status: DC | PRN

## 2020-11-29 MED ORDER — SODIUM CHLORIDE 0.9% FLUSH
3.0000 mL | Freq: Two times a day (BID) | INTRAVENOUS | Status: DC
  Administered 2020-11-29 – 2020-12-01 (×4): 3 mL via INTRAVENOUS

## 2020-11-29 MED ORDER — PREGABALIN 25 MG PO CAPS
50.0000 mg | ORAL_CAPSULE | Freq: Every day | ORAL | Status: DC
  Administered 2020-11-30 – 2020-12-01 (×2): 50 mg via ORAL
  Filled 2020-11-29 (×2): qty 2

## 2020-11-29 MED ORDER — ACETAMINOPHEN 650 MG RE SUPP
650.0000 mg | Freq: Four times a day (QID) | RECTAL | Status: DC | PRN
Start: 2020-11-29 — End: 2020-12-01

## 2020-11-29 MED ORDER — VITAMIN D (ERGOCALCIFEROL) 1.25 MG (50000 UNIT) PO CAPS
50000.0000 [IU] | ORAL_CAPSULE | ORAL | Status: DC
  Administered 2020-11-30: 50000 [IU] via ORAL
  Filled 2020-11-29 (×2): qty 1

## 2020-11-29 MED ORDER — LINACLOTIDE 145 MCG PO CAPS
145.0000 ug | ORAL_CAPSULE | Freq: Every day | ORAL | Status: DC
  Administered 2020-11-30 – 2020-12-01 (×2): 145 ug via ORAL
  Filled 2020-11-29 (×4): qty 1

## 2020-11-29 NOTE — ED Notes (Signed)
Orthostatics:   Laying down: pulse=88 BP=154/78 Sitting up: pulse=93 BP=156/87 Standing up (walking to the door and back): pulse=103 BP=147/89

## 2020-11-29 NOTE — H&P (Signed)
History and Physical    EVANGELIA WHITAKER PZW:[id number] DOB: 06-07-1951 DOA: 11/29/2020  PCP: [name], FNP  Patient coming from: Home  I have personally briefly reviewed patient's old medical records in [place]  Chief Complaint: Syncope  HPI: [name] is a 70 y.o. female with medical history significant of DM2, HTN, HLD, mild AS.  Pt presents to ED after syncopal episode that occurred this afternoon.  After seeing vascular surgeon at office for PAD she was standing outside office when she had syncope episode.  No preceding symptoms, not on blood thinners.  No visual changes, no speech changes, no unilateral weakness.  Of note, patient has been having intermittent R sided chest pain, "tightness" quality.  That has been ongoing "for a while now".  CP seems to get worse with activity and better at rest.  Last NST was in 2017.  AS was mild on 2d echo as of 2020.   ED Course: no CP at rest in ED.  Trop neg x2.  Creat 1.27,  HGB 16.  Hospitalist asked to admit for syncope r/o.   Review of Systems: As per HPI, otherwise all review of systems negative.  Past Medical History:  Diagnosis Date  . Aortic stenosis    mild on 04/07/19 echo  . Arthritis    L knee, hands, back   . Asthma   . Diabetes mellitus type 2, insulin dependent ([place])   . Fibromyalgia   . Full dentures   . Full dentures   . GERD (gastroesophageal reflux disease)   . H/O hiatal hernia   . Headache    h/o migraines - was followed for a time with a wellness doctor  . History of esophageal dilatation   . History of rhabdomyolysis    03/ 2015  . Hyperlipidemia   . Hypertension   . Insulin pump in place    since 12/ 2014--  [employer]  . Lumbar disc herniation    right leg weakness  . Osteoarthritis of left knee, primary localized 08/17/2014  . 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  . Renal insufficiency   . Rhabdomyolysis 11/15/2013  . SUI (stress urinary incontinence, female)   .  Syncope 12/27/2017  . Wears glasses   . Wears glasses     Past Surgical History:  Procedure Laterality Date  . BLADDER SUSPENSION N/A 03/09/2014   Procedure: CYSTOSCOPY/SLING;  Surgeon: Reece Packer, MD;  [facility];  Service: Urology;  Laterality: N/A;  . CARDIAC CATHETERIZATION  07-20-2009   DR Shelva Majestic   MODERATE LVH/  NORMAL  LVEF/  NORMAL CORONARY AND RENAL ARTERIES  . CARPAL TUNNEL RELEASE Right 2000  . CHOLECYSTECTOMY  1990  . COLONOSCOPY WITH ESOPHAGOGASTRODUODENOSCOPY (EGD)  06-18-2002  . ESOPHAGOGASTRODUODENOSCOPY (EGD) WITH ESOPHAGEAL DILATION  06-12-2010  . EXCISION LEFT UPPER ARM MASS  01-29-2014  . EYE SURGERY  2010   laser on R eye  . INTERSTIM IMPLANT REMOVAL N/A 03/06/2019   Procedure: REMOVAL OF INTERSTIM IMPLANT;  Surgeon: Bjorn Loser, MD;  Location: [facility];  Service: Urology;  Laterality: N/A;  . KNEE ARTHROSCOPY Right 1989  &  2002  . LUMBAR LAMINECTOMY N/A 02/19/2020   Procedure: right L4 hemiaminectomy, microdiscectomy;  Surgeon: Marybelle Killings, MD;  Location: [place];  Service: Orthopedics;  Laterality: N/A;  . MULTIPLE TOOTH EXTRACTIONS    . NEGATIVE SLEEP STUDY  2012   PER PT  . PARTIAL KNEE ARTHROPLASTY Left 08/17/2014   Procedure: LEFT UNICOMPARTMENTAL KNEE;  Surgeon: Johnny Bridge, MD;  Location: [place];  Service: Orthopedics;  Laterality: Left;     reports that she has never smoked. She has never used smokeless tobacco. She reports that she does not drink alcohol and does not use drugs.  Allergies  Allergen Reactions  . Pollen Extract Other (See Comments)    Congestion/sneezing  . Tomato Hives and Itching  . Codeine Hives, Itching and Nausea And Vomiting    Family History  Problem Relation Age of Onset  . Diabetes Mother   . Hypertension Mother   . Lung cancer Mother        smoked  . Diabetes Father   . Hypertension Father   . Lung cancer Father        smoked  . Diabetes Brother   . Heart failure  Sister   . Diabetes Maternal Grandmother   . Cancer Maternal Grandmother      Prior to Admission medications   Medication Sig Start Date End Date Taking? Authorizing Provider  amLODipine (NORVASC) 10 MG tablet TAKE ONE TABLET BY MOUTH EVERYDAY AT BEDTIME 05/12/20  Yes [name], FNP  Continuous Blood Gluc Receiver (FREESTYLE LIBRE READER) [place] 1 each by Does not apply route See admin instructions. CGM device-Freestyle Libre   Yes [provider]  Continuous Blood Gluc Sensor (FREESTYLE LIBRE 14 DAY SENSOR) MISC 1 each by Does not apply route See admin instructions. CGM 14-day sensors; send refills to [facility] (in [place])   Yes [provider]  docusate sodium (COLACE) 100 MG capsule Take 100 mg by mouth daily as needed for mild constipation.    Yes [provider]  FARXIGA 10 MG TABS tablet TAKE ONE TABLET BY MOUTH BEFORE BREAKFAST 11/24/20  Yes [name], FNP  HUMALOG 100 UNIT/ML injection INJECT 25 UNITS into THE SKIN WITH BREAKFAST AND SUPPER AND 35 UNITS WITH LUNCH 08/08/20  Yes [name], FNP  insulin degludec (TRESIBA FLEXTOUCH) 100 UNIT/ML FlexTouch Pen Inject 20 Units into the skin daily. 09/26/20  Yes [name], MD  Insulin Pen Needle (BD PEN NEEDLE MICRO U/F) 32G X 6 MM MISC DX:E11.65 USE TO CHECK BLOOD SUGARS THREE TIMES A DAY 05/12/20  Yes [name], MD  Lancets Misc. MISC 1 each by Does not apply route 4 (four) times daily -  with meals and at bedtime. 12/16/18  Yes [name], FNP  linaclotide [facility]) 145 MCG CAPS capsule Take 1 capsule (145 mcg total) by mouth daily before breakfast. 10/25/20  Yes [name], FNP  mirabegron ER (MYRBETRIQ) 50 MG TB24 tablet Take 1 tablet (50 mg total) by mouth daily. 05/27/19  Yes [name], FNP  Multiple Vitamin (MULTIVITAMIN WITH MINERALS) TABS tablet Take 1 tablet by mouth daily.   Yes [provider]  pravastatin (PRAVACHOL) 40 MG tablet TAKE ONE TABLET BY MOUTH EVERYDAY AT  BEDTIME 10/24/20  Yes [name], FNP  pregabalin (LYRICA) 50 MG capsule Take 1 capsule (50 mg total) by mouth 3 (three) times daily. 08/21/20  Yes [name], FNP  spironolactone (ALDACTONE) 50 MG tablet TAKE ONE TABLET BY MOUTH EVERY MORNING 05/12/20  Yes [name], FNP  Vitamin D, Ergocalciferol, (DRISDOL) 1.25 MG (50000 UNIT) CAPS capsule TAKE ONE CAPSULE BY MOUTH ONCE WEEKLY ON [facility] 10/24/20  Yes [name], FNP    Physical Exam: Vitals:   11/29/20 1608 11/29/20 1952 11/29/20 2115  BP: (!) 155/83 138/80 107/76  Pulse: 89 89 88  Resp: 16 16 19   Temp: 98.2  F (36.8 C) 98.5 F (36.9 C)   TempSrc: Oral Oral   SpO2: 99% 100% 98%    Constitutional: NAD, calm, comfortable Eyes: PERRL, lids and conjunctivae normal ENMT: Mucous membranes are moist. Posterior pharynx clear of any exudate or lesions.Normal dentition.  Neck: normal, supple, no masses, no thyromegaly Respiratory: clear to auscultation bilaterally, no wheezing, no crackles. Normal respiratory effort. No accessory muscle use.  Cardiovascular: Regular rate and rhythm, no murmurs / rubs / gallops. No extremity edema. 2+ pedal pulses. No carotid bruits.  Abdomen: no tenderness, no masses palpated. No hepatosplenomegaly. Bowel sounds positive.  Musculoskeletal: no clubbing / cyanosis. No joint deformity upper and lower extremities. Good ROM, no contractures. Normal muscle tone.  Skin: no rashes, lesions, ulcers. No induration Neurologic: CN 2-12 grossly intact. Sensation intact, DTR normal. Strength 5/5 in all 4.  Psychiatric: Normal judgment and insight. Alert and oriented x 3. Normal mood.    Labs on Admission: I have personally reviewed following labs and imaging studies  CBC: Recent Labs  Lab 11/29/20 1638  WBC 7.4  NEUTROABS 5.5  HGB 16.1*  HCT 49.2*  MCV 95.0  PLT 101   Basic Metabolic Panel: Recent Labs  Lab 11/29/20 1638  NA 139  K 3.9  CL 102  CO2 30  GLUCOSE 253*  BUN 17  CREATININE  1.27*  CALCIUM 9.6   GFR: Estimated Creatinine Clearance: 37.1 mL/min (A) (by C-G formula based on SCr of 1.27 mg/dL (H)). Liver Function Tests: No results for input(s): AST, ALT, ALKPHOS, BILITOT, PROT, ALBUMIN in the last 168 hours. No results for input(s): LIPASE, AMYLASE in the last 168 hours. No results for input(s): AMMONIA in the last 168 hours. Coagulation Profile: No results for input(s): INR, PROTIME in the last 168 hours. Cardiac Enzymes: No results for input(s): CKTOTAL, CKMB, CKMBINDEX, TROPONINI in the last 168 hours. BNP (last 3 results) No results for input(s): PROBNP in the last 8760 hours. HbA1C: No results for input(s): HGBA1C in the last 72 hours. CBG: No results for input(s): GLUCAP in the last 168 hours. Lipid Profile: No results for input(s): CHOL, HDL, LDLCALC, TRIG, CHOLHDL, LDLDIRECT in the last 72 hours. Thyroid Function Tests: No results for input(s): TSH, T4TOTAL, FREET4, T3FREE, THYROIDAB in the last 72 hours. Anemia Panel: No results for input(s): VITAMINB12, FOLATE, FERRITIN, TIBC, IRON, RETICCTPCT in the last 72 hours. Urine analysis:    Component Value Date/Time   COLORURINE AMBER (A) 02/19/2020 1041   APPEARANCEUR CLOUDY (A) 02/19/2020 1041   LABSPEC 1.028 02/19/2020 1041   PHURINE 5.0 02/19/2020 1041   GLUCOSEU >=500 (A) 02/19/2020 1041   GLUCOSEU >=1000 03/16/2013 1259   HGBUR SMALL (A) 02/19/2020 1041   HGBUR trace-intact 11/17/2010 1230   BILIRUBINUR NEGATIVE 02/19/2020 1041   BILIRUBINUR negative 12/31/2019 1519   KETONESUR 20 (A) 02/19/2020 1041   PROTEINUR >=300 (A) 02/19/2020 1041   UROBILINOGEN 0.2 12/31/2019 1519   UROBILINOGEN 0.2 11/15/2013 0040   NITRITE NEGATIVE 02/19/2020 1041   LEUKOCYTESUR SMALL (A) 02/19/2020 1041    Radiological Exams on Admission: DG Chest 1 View  Result Date: 11/29/2020 CLINICAL DATA:  Chest pain.  Syncopal episode. EXAM: CHEST  1 VIEW COMPARISON:  Chest x-ray 02/19/2020 FINDINGS: The cardiac  silhouette, mediastinal and hilar contours are within normal limits. Mild calcification of the thoracic aorta is noted. The lungs are clear.  No pleural effusions.  No pulmonary lesions. The bony thorax is intact. IMPRESSION: No acute cardiopulmonary findings. Electronically Signed   By: Mamie Nick.  Gallerani M.D.   On: 11/29/2020 18:41   CT Head Wo Contrast  Result Date: 11/29/2020 CLINICAL DATA:  Syncope EXAM: CT HEAD WITHOUT CONTRAST TECHNIQUE: Contiguous axial images were obtained from the base of the skull through the vertex without intravenous contrast. COMPARISON:  04/06/2019 FINDINGS: Brain: There is atrophy and chronic small vessel disease changes. No acute intracranial abnormality. Specifically, no hemorrhage, hydrocephalus, mass lesion, acute infarction, or significant intracranial injury. Vascular: No hyperdense vessel or unexpected calcification. Skull: No acute calvarial abnormality. Sinuses/Orbits: Visualized paranasal sinuses and mastoids clear. Orbital soft tissues unremarkable. Other: None IMPRESSION: Atrophy, chronic microvascular disease. No acute intracranial abnormality. Electronically Signed   By: Rolm Baptise M.D.   On: 11/29/2020 17:46   CT Cervical Spine Wo Contrast  Result Date: 11/29/2020 CLINICAL DATA:  Syncope EXAM: CT CERVICAL SPINE WITHOUT CONTRAST TECHNIQUE: Multidetector CT imaging of the cervical spine was performed without intravenous contrast. Multiplanar CT image reconstructions were also generated. COMPARISON:  04/06/2019 FINDINGS: Alignment: No subluxation Skull base and vertebrae: No acute fracture. No primary bone lesion or focal pathologic process. Soft tissues and spinal canal: No prevertebral fluid or swelling. No visible canal hematoma. Disc levels: Diffuse degenerative disc and facet disease, most pronounced in the lower cervical spine. Upper chest: Negative Other: None IMPRESSION: Cervical spondylosis.  No acute bony abnormality. Electronically Signed   By: Rolm Baptise  M.D.   On: 11/29/2020 17:48   VAS [place] ABI WITH/WO TBI  Result Date: 11/29/2020 LOWER EXTREMITY DOPPLER STUDY Indications: Claudication. High Risk Factors: Hypertension, hyperlipidemia, Diabetes.  Comparison Study: None Performing Technologist: Ivan Croft  Examination Guidelines: A complete evaluation includes at minimum, Doppler waveform signals and systolic blood pressure reading at the level of bilateral brachial, anterior tibial, and posterior tibial arteries, when vessel segments are accessible. Bilateral testing is considered an integral part of a complete examination. Photoelectric Plethysmograph (PPG) waveforms and toe systolic pressure readings are included as required and additional duplex testing as needed. Limited examinations for reoccurring indications may be performed as noted.  ABI Findings: +---------+------------------+-----+---------+--------+ Right    Rt Pressure (mmHg)IndexWaveform Comment  +---------+------------------+-----+---------+--------+ Brachial 177                                      +---------+------------------+-----+---------+--------+ PTA      201               1.13 triphasic         +---------+------------------+-----+---------+--------+ DP       211               1.19 triphasic         +---------+------------------+-----+---------+--------+ Great Toe167               0.94                   +---------+------------------+-----+---------+--------+ +---------+------------------+-----+---------+-------+ Left     Lt Pressure (mmHg)IndexWaveform Comment +---------+------------------+-----+---------+-------+ Brachial 178                                     +---------+------------------+-----+---------+-------+ PTA      181               1.02 triphasic        +---------+------------------+-----+---------+-------+ DP       177  0.99 triphasic        +---------+------------------+-----+---------+-------+ Great Toe160                0.90                  +---------+------------------+-----+---------+-------+ +-------+-----------+-----------+------------+------------+ ABI/TBIToday's ABIToday's TBIPrevious ABIPrevious TBI +-------+-----------+-----------+------------+------------+ Right  1.19       0.94                                +-------+-----------+-----------+------------+------------+ Left   1.02       0.90                                +-------+-----------+-----------+------------+------------+   Summary: Right: Resting right ankle-brachial index is within normal range. No evidence of significant right lower extremity arterial disease. The right toe-brachial index is normal. Left: Resting left ankle-brachial index is within normal range. No evidence of significant left lower extremity arterial disease. The left toe-brachial index is normal.  *See table(s) above for measurements and observations.  Electronically signed by Jamelle Haring on 11/29/2020 at 2:36:07 PM.    Final     EKG: Independently reviewed.  Assessment/Plan Principal Problem:   Syncope Active Problems:   CHEST PAIN   Type II diabetes mellitus with renal manifestations (HCC)   HLD (hyperlipidemia)   HTN (hypertension)   Mild aortic stenosis    1. Syncope - 1. Syncope pathway 2. Tele monitor 3. 2d echo 2. CP - 1. ? Intermittent cardiac angina 2. Trops neg thus far 3. Tele monitor 4. Last NST was 2017 5. 2d echo ordered 6. Cards eval in AM 3. H/o AS - mild on echo in 2020 1. 2d echo ordered for syncope as noted above 2. Will see if theres any change here 4. DM2 - 1. levemir 15 daily (takes 18 at home) 2. Sensitive SSI Q4H while NPO  Increase to Mod if needed 5. HTN - 1. Cont home amlodipine QHS 2. Will "gently hydrate" patient by holding home aldactone tomorrow 6. HLD - 1. Cont home statin  DVT prophylaxis: Lovenox Code Status: Full Family Communication: Family at bedside Disposition Plan: Home after  cleared by cards Consults called: Message sent to P. Abagail Kitchens for AM cards eval Admission status: Place in obs    Dmarcus Decicco, [place] [facility]  How to contact the [facility] Attending or Consulting provider [place] or covering provider during after hours [place], for this patient?  1. Check the care team in [facility] and look for a) attending/consulting [facility] provider listed and b) the [facility] team listed 2. Log into [email or web address]  Amion Physician Scheduling and messaging for groups and whole hospitals  On call and physician scheduling software for group practices, residents, hospitalists and other medical providers for call, clinic, rotation and shift schedules. On[employer] is a hospital-wide system for scheduling doctors and paging doctors on call. EasyPlot is for scientific plotting and data analysis.  [email or web address]  and use [place]'s universal password to access. If you do not have the password, please contact the hospital operator.  3. Locate the [facility] provider you are looking for under [facility] and page to a number that you can be directly reached. 4. If you still have difficulty reaching the provider, please page the [facility] (Director on Call) for the Hospitalists listed on amion for assistance.  11/29/2020, 9:51 PM

## 2020-11-29 NOTE — ED Provider Notes (Signed)
[facility] EMERGENCY DEPARTMENT Provider Note   CSN: [id number] Arrival date & time: 11/29/20  1606     History No chief complaint on file.   [name] is a 70 y.o. female.  70 year old female presents after having a syncopal event.  Patient states that she was standing and passed out.  No prodromal symptoms.  No chest pain or shortness of breath.  No recent illnesses that she knows of.  Has had 2 episodes of similar symptoms in the past several months.  No reported seizure activity although she did have 1 episode of emesis.  States she feels chronically tired for several months and this is unchanged.  She denies any focal weakness.        Past Medical History:  Diagnosis Date  . Aortic stenosis    mild on 04/07/19 echo  . Arthritis    L knee, hands, back   . Asthma   . Diabetes mellitus type 2, insulin dependent ([place])   . Fibromyalgia   . Full dentures   . Full dentures   . GERD (gastroesophageal reflux disease)   . H/O hiatal hernia   . Headache    h/o migraines - was followed for a time with a wellness doctor  . History of esophageal dilatation   . History of rhabdomyolysis    03/ 2015  . Hyperlipidemia   . Hypertension   . Insulin pump in place    since 12/ 2014--  [employer]  . Lumbar disc herniation    right leg weakness  . Osteoarthritis of left knee, primary localized 08/17/2014  . 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  . Renal insufficiency   . Rhabdomyolysis 11/15/2013  . SUI (stress urinary incontinence, female)   . Syncope 12/27/2017  . Wears glasses   . Wears glasses     Patient Active Problem List   Diagnosis Date Noted  . Irritable bowel syndrome with diarrhea 10/10/2020  . Gastroesophageal reflux disease with esophagitis 10/10/2020  . Postlaminectomy syndrome, not elsewhere classified 07/03/2020  . S/P lumbar laminectomy 03/30/2020  . Localized swelling, mass and lump, multiple sites 03/27/2018  . Type II diabetes mellitus with  renal manifestations ([place]) 12/27/2017  . HLD (hyperlipidemia) 12/27/2017  . HTN (hypertension) 12/27/2017  . CKD (chronic kidney disease), stage III ([place]) 12/27/2017  . Cough variant asthma 12/01/2015  . Osteoarthritis of left knee, primary localized 08/17/2014  . Knee osteoarthritis 08/17/2014  . Hypoglycemia 11/15/2013  . Other and unspecified hyperlipidemia 08/06/2013  . Type II diabetes mellitus, uncontrolled ([place]) 07/20/2013  . Varicose veins of lower extremities with other complications 02/40/9735  . Fibromyalgia   . GERD 05/18/2010  . ABDOMINAL PAIN, GENERALIZED 05/02/2010  . OBESITY 04/20/2010  . BURSITIS, LEFT SHOULDER 03/09/2010  . Lipoma of arm s/p excision 01/29/2014 01/10/2010  . DEPRESSION 12/27/2009  . BACK PAIN WITH RADICULOPATHY 12/27/2009  . CYSTITIS, ACUTE 10/21/2009  . MICROSCOPIC HEMATURIA 10/18/2009  . KNEE PAIN, BILATERAL 10/18/2009  . NEUROPATHY 09/08/2009  . Asthma 09/08/2009  . STRESS INCONTINENCE 09/08/2009  . CHEST PAIN 07/19/2009  . ESOPHAGEAL STRICTURE 08/27/2005  . Diabetes mellitus without complication ([place]) 32/99/2426    Past Surgical History:  Procedure Laterality Date  . BLADDER SUSPENSION N/A 03/09/2014   Procedure: CYSTOSCOPY/SLING;  Surgeon: Reece Packer, MD;  Location: [facility];  Service: Urology;  Laterality: N/A;  . CARDIAC CATHETERIZATION  07-20-2009   DR Shelva Majestic   MODERATE LVH/  NORMAL  LVEF/  NORMAL CORONARY AND RENAL  ARTERIES  . CARPAL TUNNEL RELEASE Right 2000  . CHOLECYSTECTOMY  1990  . COLONOSCOPY WITH ESOPHAGOGASTRODUODENOSCOPY (EGD)  06-18-2002  . ESOPHAGOGASTRODUODENOSCOPY (EGD) WITH ESOPHAGEAL DILATION  06-12-2010  . EXCISION LEFT UPPER ARM MASS  01-29-2014  . EYE SURGERY  2010   laser on R eye  . INTERSTIM IMPLANT REMOVAL N/A 03/06/2019   Procedure: REMOVAL OF INTERSTIM IMPLANT;  Surgeon: Bjorn Loser, MD;  Location: [facility];  Service: Urology;  Laterality: N/A;  . KNEE ARTHROSCOPY Right  1989  &  2002  . LUMBAR LAMINECTOMY N/A 02/19/2020   Procedure: right L4 hemiaminectomy, microdiscectomy;  Surgeon: Marybelle Killings, MD;  Location: [facility];  Service: Orthopedics;  Laterality: N/A;  . MULTIPLE TOOTH EXTRACTIONS    . NEGATIVE SLEEP STUDY  2012   PER PT  . PARTIAL KNEE ARTHROPLASTY Left 08/17/2014   Procedure: LEFT UNICOMPARTMENTAL KNEE;  Surgeon: Johnny Bridge, MD;  Location: [facility];  Service: Orthopedics;  Laterality: Left;     OB History    Gravida  1   Para  1   Term  1   Preterm      AB      Living  1     SAB      IAB      Ectopic      Multiple      Live Births  1           Family History  Problem Relation Age of Onset  . Diabetes Mother   . Hypertension Mother   . Lung cancer Mother        smoked  . Diabetes Father   . Hypertension Father   . Lung cancer Father        smoked  . Diabetes Brother   . Heart failure Sister   . Diabetes Maternal Grandmother   . Cancer Maternal Grandmother     Social History   Tobacco Use  . Smoking status: Never Smoker  . Smokeless tobacco: Never Used  Vaping Use  . Vaping Use: Never used  Substance Use Topics  . Alcohol use: No  . Drug use: No    Home Medications Prior to Admission medications   Medication Sig Start Date End Date Taking? Authorizing Provider  AMITIZA 8 MCG capsule TAKE ONE CAPSULE BY MOUTH EVERY MORNING 10/24/20   [name], FNP  amLODipine (NORVASC) 10 MG tablet TAKE ONE TABLET BY MOUTH EVERYDAY AT BEDTIME 05/12/20   [name], FNP  Continuous Blood Gluc Receiver (FREESTYLE LIBRE READER) DEVI 1 each by Does not apply route See admin instructions. CGM device-[employer], Historical, MD  Continuous Blood Gluc Sensor (FREESTYLE LIBRE 14 DAY SENSOR) MISC 1 each by Does not apply route See admin instructions. CGM 14-day sensors; send refills to [facility] (in [place])    [provider]  docusate sodium (COLACE) 100 MG capsule Take 100 mg by mouth  daily as needed for mild constipation.     [provider]  FARXIGA 10 MG TABS tablet TAKE ONE TABLET BY MOUTH BEFORE BREAKFAST 11/24/20   [name], FNP  gabapentin (NEURONTIN) 300 MG capsule TAKE ONE CAPSULE BY MOUTH TWICE DAILY Patient not taking: No sig reported 08/22/20   Aundra Dubin, PA-C  HUMALOG 100 UNIT/ML injection INJECT 25 UNITS into THE SKIN WITH BREAKFAST AND SUPPER AND 35 UNITS WITH LUNCH 08/08/20   [name], FNP  insulin degludec (TRESIBA FLEXTOUCH) 100 UNIT/ML FlexTouch Pen Inject 20  Units into the skin daily. 09/26/20   [name], MD  insulin lispro (HUMALOG KWIKPEN) 100 UNIT/ML KwikPen INJECT 25 UNITS SUBCUTANEOUSLY AT [facility] AND dinner AND INJECT 35 UNITS AT LUNCH 11/07/20   [name], FNP  Insulin Pen Needle (BD PEN NEEDLE MICRO U/F) 32G X 6 MM MISC DX:E11.65 USE TO CHECK BLOOD SUGARS THREE TIMES A DAY 05/12/20   [name], MD  Lancets Misc. MISC 1 each by Does not apply route 4 (four) times daily -  with meals and at bedtime. 12/16/18   [name], FNP  linaclotide [facility]) 145 MCG CAPS capsule Take 1 capsule (145 mcg total) by mouth daily before breakfast. 10/25/20   [name], FNP  mirabegron ER (MYRBETRIQ) 50 MG TB24 tablet Take 1 tablet (50 mg total) by mouth daily. 05/27/19   [name], FNP  mometasone-formoterol (DULERA) 100-5 MCG/ACT AERO Inhale 2 puffs into the lungs as needed for wheezing.     [provider]  Multiple Vitamin (MULTIVITAMIN WITH MINERALS) TABS tablet Take 1 tablet by mouth daily.    [provider]  ondansetron (ZOFRAN) 4 MG tablet Take 1 tablet (4 mg total) by mouth every 6 (six) hours as needed for nausea or vomiting. Patient not taking: No sig reported 02/20/20   Pete Pelt, PA-C  OZEMPIC, 1 MG/DOSE, 4 MG/3ML SOPN INJECT 1mg  into THE SKIN every THURSDAY 09/05/20   [name], FNP  pantoprazole (PROTONIX) 40 MG tablet Take 30- 60 min before your first and last meals of the  day Patient not taking: No sig reported 12/01/15   Tanda Rockers, MD  pravastatin (PRAVACHOL) 40 MG tablet TAKE ONE TABLET BY MOUTH EVERYDAY AT BEDTIME 10/24/20   [name], FNP  pregabalin (LYRICA) 50 MG capsule Take 1 capsule (50 mg total) by mouth 3 (three) times daily. 08/21/20   [name], FNP  spironolactone (ALDACTONE) 50 MG tablet TAKE ONE TABLET BY MOUTH EVERY MORNING 05/12/20   [name], FNP  Vitamin D, Ergocalciferol, (DRISDOL) 1.25 MG (50000 UNIT) CAPS capsule TAKE ONE CAPSULE BY MOUTH ONCE WEEKLY ON [facility] 10/24/20   [name], FNP    Allergies    Pollen extract, Tomato, and Codeine  Review of Systems   Review of Systems  All other systems reviewed and are negative.   Physical Exam Updated Vital Signs BP 138/80 (BP Location: Left Arm)   Pulse 89   Temp 98.5 F (36.9 C) (Oral)   Resp 16   LMP  (LMP Unknown)   SpO2 100%   Physical Exam Vitals and nursing note reviewed.  Constitutional:      General: She is not in acute distress.    Appearance: Normal appearance. She is well-developed. She is not toxic-appearing.  HENT:     Head: Normocephalic and atraumatic.  Eyes:     General: Lids are normal.     Conjunctiva/sclera: Conjunctivae normal.     Pupils: Pupils are equal, round, and reactive to light.  Neck:     Thyroid: No thyroid mass.     Trachea: No tracheal deviation.  Cardiovascular:     Rate and Rhythm: Normal rate and regular rhythm.     Heart sounds: Normal heart sounds. No murmur heard. No gallop.   Pulmonary:     Effort: Pulmonary effort is normal. No respiratory distress.     Breath sounds: Normal breath sounds. No stridor. No decreased breath sounds, wheezing, rhonchi or rales.  Abdominal:     General: Bowel sounds are  normal. There is no distension.     Palpations: Abdomen is soft.     Tenderness: There is no abdominal tenderness. There is no rebound.  Musculoskeletal:        General: No tenderness. Normal range of motion.      Cervical back: Normal range of motion and neck supple.  Skin:    General: Skin is warm and dry.     Findings: No abrasion or rash.  Neurological:     Mental Status: She is alert and oriented to person, place, and time.     GCS: GCS eye subscore is 4. GCS verbal subscore is 5. GCS motor subscore is 6.     Cranial Nerves: No cranial nerve deficit.     Sensory: No sensory deficit.  Psychiatric:        Speech: Speech normal.        Behavior: Behavior normal.     ED Results / Procedures / Treatments   Labs (all labs ordered are listed, but only abnormal results are displayed) Labs Reviewed  CBC WITH DIFFERENTIAL/PLATELET - Abnormal; Notable for the following components:      Result Value   RBC 5.18 (*)    Hemoglobin 16.1 (*)    HCT 49.2 (*)    All other components within normal limits  BASIC METABOLIC PANEL - Abnormal; Notable for the following components:   Glucose, Bld 253 (*)    Creatinine, Ser 1.27 (*)    GFR, Estimated 46 (*)    All other components within normal limits  SARS CORONAVIRUS 2 (TAT 6-24 HRS)  TROPONIN I (HIGH SENSITIVITY)  TROPONIN I (HIGH SENSITIVITY)    EKG EKG Interpretation  Date/Time:  Tuesday November 29 2020 16:28:57 EDT Ventricular Rate:  94 PR Interval:  146 QRS Duration: 80 QT Interval:  364 QTC Calculation: 455 R Axis:   40 Text Interpretation: Normal sinus rhythm Cannot rule out Anterior infarct , age undetermined Abnormal ECG Confirmed by Lacretia Leigh ([id number]) on 11/29/2020 8:26:24 PM   Radiology DG Chest 1 View  Result Date: 11/29/2020 CLINICAL DATA:  Chest pain.  Syncopal episode. EXAM: CHEST  1 VIEW COMPARISON:  Chest x-ray 02/19/2020 FINDINGS: The cardiac silhouette, mediastinal and hilar contours are within normal limits. Mild calcification of the thoracic aorta is noted. The lungs are clear.  No pleural effusions.  No pulmonary lesions. The bony thorax is intact. IMPRESSION: No acute cardiopulmonary findings. Electronically Signed   By: Marijo Sanes M.D.   On: 11/29/2020 18:41   CT Head Wo Contrast  Result Date: 11/29/2020 CLINICAL DATA:  Syncope EXAM: CT HEAD WITHOUT CONTRAST TECHNIQUE: Contiguous axial images were obtained from the base of the skull through the vertex without intravenous contrast. COMPARISON:  04/06/2019 FINDINGS: Brain: There is atrophy and chronic small vessel disease changes. No acute intracranial abnormality. Specifically, no hemorrhage, hydrocephalus, mass lesion, acute infarction, or significant intracranial injury. Vascular: No hyperdense vessel or unexpected calcification. Skull: No acute calvarial abnormality. Sinuses/Orbits: Visualized paranasal sinuses and mastoids clear. Orbital soft tissues unremarkable. Other: None IMPRESSION: Atrophy, chronic microvascular disease. No acute intracranial abnormality. Electronically Signed   By: Rolm Baptise M.D.   On: 11/29/2020 17:46   CT Cervical Spine Wo Contrast  Result Date: 11/29/2020 CLINICAL DATA:  Syncope EXAM: CT CERVICAL SPINE WITHOUT CONTRAST TECHNIQUE: Multidetector CT imaging of the cervical spine was performed without intravenous contrast. Multiplanar CT image reconstructions were also generated. COMPARISON:  04/06/2019 FINDINGS: Alignment: No subluxation Skull base and vertebrae: No acute fracture.  No primary bone lesion or focal pathologic process. Soft tissues and spinal canal: No prevertebral fluid or swelling. No visible canal hematoma. Disc levels: Diffuse degenerative disc and facet disease, most pronounced in the lower cervical spine. Upper chest: Negative Other: None IMPRESSION: Cervical spondylosis.  No acute bony abnormality. Electronically Signed   By: Rolm Baptise M.D.   On: 11/29/2020 17:48   VAS [place] ABI WITH/WO TBI  Result Date: 11/29/2020 LOWER EXTREMITY DOPPLER STUDY Indications: Claudication. High Risk Factors: Hypertension, hyperlipidemia, Diabetes.  Comparison Study: None Performing Technologist: Ivan Croft  Examination Guidelines: A  complete evaluation includes at minimum, Doppler waveform signals and systolic blood pressure reading at the level of bilateral brachial, anterior tibial, and posterior tibial arteries, when vessel segments are accessible. Bilateral testing is considered an integral part of a complete examination. Photoelectric Plethysmograph (PPG) waveforms and toe systolic pressure readings are included as required and additional duplex testing as needed. Limited examinations for reoccurring indications may be performed as noted.  ABI Findings: +---------+------------------+-----+---------+--------+ Right    Rt Pressure (mmHg)IndexWaveform Comment  +---------+------------------+-----+---------+--------+ Brachial 177                                      +---------+------------------+-----+---------+--------+ PTA      201               1.13 triphasic         +---------+------------------+-----+---------+--------+ DP       211               1.19 triphasic         +---------+------------------+-----+---------+--------+ Great Toe167               0.94                   +---------+------------------+-----+---------+--------+ +---------+------------------+-----+---------+-------+ Left     Lt Pressure (mmHg)IndexWaveform Comment +---------+------------------+-----+---------+-------+ Brachial 178                                     +---------+------------------+-----+---------+-------+ PTA      181               1.02 triphasic        +---------+------------------+-----+---------+-------+ DP       177               0.99 triphasic        +---------+------------------+-----+---------+-------+ Great Toe160               0.90                  +---------+------------------+-----+---------+-------+ +-------+-----------+-----------+------------+------------+ ABI/TBIToday's ABIToday's TBIPrevious ABIPrevious TBI +-------+-----------+-----------+------------+------------+ Right   1.19       0.94                                +-------+-----------+-----------+------------+------------+ Left   1.02       0.90                                +-------+-----------+-----------+------------+------------+   Summary: Right: Resting right ankle-brachial index is within normal range. No evidence of significant right lower extremity arterial disease. The right toe-brachial index is normal. Left: Resting left ankle-brachial index is within  normal range. No evidence of significant left lower extremity arterial disease. The left toe-brachial index is normal.  *See table(s) above for measurements and observations.  Electronically signed by Jamelle Haring on 11/29/2020 at 2:36:07 PM.    Final     Procedures Procedures   Medications Ordered in ED Medications - No data to display  ED Course  I have reviewed the triage vital signs and the nursing notes.  Pertinent labs & imaging results that were available during my care of the patient were reviewed by me and considered in my medical decision making (see chart for details).    MDM Rules/Calculators/A&P                          Patient is EKG unchanged from prior.  CT of head and cervical spine without acute findings.  Hemoglobin stable.  Will consult hospitalist for admission due to recurrent syncope Final Clinical Impression(s) / ED Diagnoses Final diagnoses:  None    Rx / DC Orders ED Discharge Orders    None       Lacretia Leigh, MD 11/29/20 2036

## 2020-11-29 NOTE — ED Triage Notes (Signed)
Emergency Medicine Provider Triage Evaluation Note  BAYLEI SIEBELS , a 70 y.o. female  was evaluated in triage.  Pt complains of syncope. Patient was standing outside the vascular office when she had a syncopal episode. No history of seizures. One episode of emesis following the accident. No preceding symptoms. She is not currently on blood thinners. No visual changes, speech changes, or unilateral weakness. Admits to right chest pain that is intermittent and ongoing for some time now.  Review of Systems  Positive: Syncope, chest pain   Physical Exam  BP (!) 155/83 (BP Location: Left Arm)   Pulse 89   Temp 98.2 F (36.8 C) (Oral)   Resp 16   LMP  (LMP Unknown)   SpO2 99%  Gen:   Awake, no distress   HEENT:  Atraumatic  Resp:  Normal effort  Cardiac:  Normal rate  Abd:   Nondistended, nontender  MSK:   Moves extremities without difficulty  Neuro:  Speech clear   Medical Decision Making  Medically screening exam initiated at 4:39 PM.  Appropriate orders placed.  HANAA PAYES was informed that the remainder of the evaluation will be completed by another provider, this initial triage assessment does not replace that evaluation, and the importance of remaining in the ED until their evaluation is complete.  Clinical Impression  Syncopal episode. CT head and cervical spine to rule out acute abnormalities. Cardiac labs ordered due to right sided chest pain.    Suzy Bouchard, [place] 11/29/20 1642

## 2020-11-29 NOTE — Progress Notes (Signed)
[facility] [place]  ASSESSMENT / PLAN: 70 y.o. female with abnormal home screening test for peripheral arterial disease. No evidence of hemodynamically significant arterial disease on physical exam or ankle-brachial index today.  Consultation to improve control of diabetes to avoid vascular complications in the future.  Follow-up with me on an as-needed basis.  CHIEF COMPLAINT: abnormal home ABI  HISTORY OF PRESENT ILLNESS: [name] is a 70 y.o. female referred for evaluation of abnormal home peripheral arterial disease screening test.  The patient reports no symptoms attributable to peripheral arterial disease.  She denies intermittent claudication, ischemic rest pain, ischemic ulceration.  She does give fairly typical symptoms of diabetic neuropathy.  She has tried gabapentin and Lyrica in the past without relief.  She has difficult to control diabetes.  Past Medical History:  Diagnosis Date  . Aortic stenosis    mild on 04/07/19 echo  . Arthritis    L knee, hands, back   . Asthma   . Diabetes mellitus type 2, insulin dependent ([place])   . Fibromyalgia   . Full dentures   . Full dentures   . GERD (gastroesophageal reflux disease)   . H/O hiatal hernia   . Headache    h/o migraines - was followed for a time with a wellness doctor  . History of esophageal dilatation   . History of rhabdomyolysis    03/ 2015  . Hyperlipidemia   . Hypertension   . Insulin pump in place    since 12/ 2014--  [employer]  . Lumbar disc herniation    right leg weakness  . Osteoarthritis of left knee, primary localized 08/17/2014  . 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  . Renal insufficiency   . Rhabdomyolysis 11/15/2013  . SUI (stress urinary incontinence, female)   . Syncope 12/27/2017  . Wears glasses   . Wears glasses     Past Surgical History:  Procedure Laterality Date  . BLADDER SUSPENSION N/A 03/09/2014   Procedure: CYSTOSCOPY/SLING;  Surgeon: Reece Packer, MD;   Location: [facility];  Service: Urology;  Laterality: N/A;  . CARDIAC CATHETERIZATION  07-20-2009   DR Shelva Majestic   MODERATE LVH/  NORMAL  LVEF/  NORMAL CORONARY AND RENAL ARTERIES  . CARPAL TUNNEL RELEASE Right 2000  . CHOLECYSTECTOMY  1990  . COLONOSCOPY WITH ESOPHAGOGASTRODUODENOSCOPY (EGD)  06-18-2002  . ESOPHAGOGASTRODUODENOSCOPY (EGD) WITH ESOPHAGEAL DILATION  06-12-2010  . EXCISION LEFT UPPER ARM MASS  01-29-2014  . EYE SURGERY  2010   laser on R eye  . INTERSTIM IMPLANT REMOVAL N/A 03/06/2019   Procedure: REMOVAL OF INTERSTIM IMPLANT;  Surgeon: Bjorn Loser, MD;  Location: [facility];  Service: Urology;  Laterality: N/A;  . KNEE ARTHROSCOPY Right 1989  &  2002  . LUMBAR LAMINECTOMY N/A 02/19/2020   Procedure: right L4 hemiaminectomy, microdiscectomy;  Surgeon: Marybelle Killings, MD;  Location: [place];  Service: Orthopedics;  Laterality: N/A;  . MULTIPLE TOOTH EXTRACTIONS    . NEGATIVE SLEEP STUDY  2012   PER PT  . PARTIAL KNEE ARTHROPLASTY Left 08/17/2014   Procedure: LEFT UNICOMPARTMENTAL KNEE;  Surgeon: Johnny Bridge, MD;  Location: [place];  Service: Orthopedics;  Laterality: Left;    Family History  Problem Relation Age of Onset  . Diabetes Mother   . Hypertension Mother   . Lung cancer Mother        smoked  . Diabetes Father   . Hypertension Father   . Lung cancer Father  smoked  . Diabetes Brother   . Heart failure Sister   . Diabetes Maternal Grandmother   . Cancer Maternal Grandmother     Social History   Socioeconomic History  . Marital status: Divorced    Spouse name: Not on file  . Number of children: Not on file  . Years of education: Not on file  . Highest education level: Not on file  Occupational History  . Occupation: disability  Tobacco Use  . Smoking status: Never Smoker  . Smokeless tobacco: Never Used  Vaping Use  . Vaping Use: Never used  Substance and Sexual Activity  . Alcohol use: No  . Drug use: No  . Sexual  activity: Not Currently  Other Topics Concern  . Not on file  Social History Narrative  . Not on file   Social Determinants of Health   Financial Resource Strain: Low Risk   . Difficulty of Paying Living Expenses: Not hard at all  Food Insecurity: No Food Insecurity  . Worried About Charity fundraiser in the Last Year: Never true  . Ran Out of Food in the Last Year: Never true  Transportation Needs: No Transportation Needs  . Lack of Transportation (Medical): No  . Lack of Transportation (Non-Medical): No  Physical Activity: Sufficiently Active  . Days of Exercise per Week: 7 days  . Minutes of Exercise per Session: 60 min  Stress: No Stress Concern Present  . Feeling of Stress : Not at all  Social Connections: Not on file  Intimate Partner Violence: Not on file    Allergies  Allergen Reactions  . Pollen Extract Other (See Comments)    Congestion/sneezing  . Tomato Hives and Itching  . Codeine Hives, Itching and Nausea And Vomiting    Current Outpatient Medications  Medication Sig Dispense Refill  . AMITIZA 8 MCG capsule TAKE ONE CAPSULE BY MOUTH EVERY MORNING 90 capsule 1  . amLODipine (NORVASC) 10 MG tablet TAKE ONE TABLET BY MOUTH EVERYDAY AT BEDTIME 90 tablet 2  . Continuous Blood Gluc Receiver (FREESTYLE LIBRE READER) DEVI 1 each by Does not apply route See admin instructions. CGM device-Freestyle Libre    . Continuous Blood Gluc Sensor (FREESTYLE LIBRE 14 DAY SENSOR) MISC 1 each by Does not apply route See admin instructions. CGM 14-day sensors; send refills to [facility] (in [place])    . docusate sodium (COLACE) 100 MG capsule Take 100 mg by mouth daily as needed for mild constipation.     [name] FARXIGA 10 MG TABS tablet TAKE ONE TABLET BY MOUTH BEFORE BREAKFAST 90 tablet 1  . HUMALOG 100 UNIT/ML injection INJECT 25 UNITS into THE SKIN WITH BREAKFAST AND SUPPER AND 35 UNITS WITH LUNCH 40 mL 2  . insulin degludec (TRESIBA FLEXTOUCH) 100 UNIT/ML FlexTouch Pen Inject 20  Units into the skin daily. 9 mL 1  . insulin lispro (HUMALOG KWIKPEN) 100 UNIT/ML KwikPen INJECT 25 UNITS SUBCUTANEOUSLY AT BREAKFAST AND dinner AND INJECT 35 UNITS AT LUNCH 30 mL 2  . Insulin Pen Needle (BD PEN NEEDLE MICRO U/F) 32G X 6 MM MISC DX:E11.65 USE TO CHECK BLOOD SUGARS THREE TIMES A DAY 100 each 6  . Lancets Misc. MISC 1 each by Does not apply route 4 (four) times daily -  with meals and at bedtime. 300 each 3  . linaclotide (LINZESS) 145 MCG CAPS capsule Take 1 capsule (145 mcg total) by mouth daily before breakfast. 30 capsule 3  . mirabegron ER (MYRBETRIQ) 50 MG TB24  tablet Take 1 tablet (50 mg total) by mouth daily. 90 tablet 0  . mometasone-formoterol (DULERA) 100-5 MCG/ACT AERO Inhale 2 puffs into the lungs as needed for wheezing.     . Multiple Vitamin (MULTIVITAMIN WITH MINERALS) TABS tablet Take 1 tablet by mouth daily.    [name] OZEMPIC, 1 MG/DOSE, 4 MG/3ML SOPN INJECT 1mg  into THE SKIN every THURSDAY 9 mL 1  . pravastatin (PRAVACHOL) 40 MG tablet TAKE ONE TABLET BY MOUTH EVERYDAY AT BEDTIME 90 tablet 2  . pregabalin (LYRICA) 50 MG capsule Take 1 capsule (50 mg total) by mouth 3 (three) times daily. 90 capsule 2  . spironolactone (ALDACTONE) 50 MG tablet TAKE ONE TABLET BY MOUTH EVERY MORNING 90 tablet 2  . Vitamin D, Ergocalciferol, (DRISDOL) 1.25 MG (50000 UNIT) CAPS capsule TAKE ONE CAPSULE BY MOUTH ONCE WEEKLY ON WEDNESDAY 13 capsule 1  . gabapentin (NEURONTIN) 300 MG capsule TAKE ONE CAPSULE BY MOUTH TWICE DAILY (Patient not taking: No sig reported) 60 capsule 0  . ondansetron (ZOFRAN) 4 MG tablet Take 1 tablet (4 mg total) by mouth every 6 (six) hours as needed for nausea or vomiting. (Patient not taking: No sig reported) 20 tablet 0  . pantoprazole (PROTONIX) 40 MG tablet Take 30- 60 min before your first and last meals of the day (Patient not taking: No sig reported) 60 tablet 11   No current facility-administered medications for this visit.    REVIEW OF SYSTEMS:  [X]   denotes positive finding, [ ]  denotes negative finding Cardiac  Comments:  Chest pain or chest pressure:    Shortness of breath upon exertion:    Short of breath when lying flat:    Irregular heart rhythm:        Vascular    Pain in calf, thigh, or hip brought on by ambulation:    Pain in feet at night that wakes you up from your sleep:     Blood clot in your veins:    Leg swelling:         Pulmonary    Oxygen at home:    Productive cough:     Wheezing:         Neurologic    Sudden weakness in arms or legs:     Sudden numbness in arms or legs:     Sudden onset of difficulty speaking or slurred speech:    Temporary loss of vision in one eye:     Problems with dizziness:         Gastrointestinal    Blood in stool:     Vomited blood:         Genitourinary    Burning when urinating:     Blood in urine:        Psychiatric    Major depression:         Hematologic    Bleeding problems:    Problems with blood clotting too easily:        Skin    Rashes or ulcers:        Constitutional    Fever or chills:      PHYSICAL EXAM  Vitals:   11/29/20 1431  BP: (!) 149/87  Pulse: 93  Resp: 20  Temp: 98.6 F (37 C)  SpO2: 100%  Weight: 66.7 kg  Height: 5\' 1"  (1.549 m)    Constitutional: well appearing. no distress. Appears well nourished.  Neurologic: CN intact. no focal findings. no sensory loss. Psychiatric: Mood and affect symmetric  and appropriate. Eyes: No icterus. No conjunctival pallor. Ears, nose, throat: mucous membranes moist. Midline trachea.  Cardiac: regular rate and rhythm.  Respiratory: unlabored. Abdominal: soft, non-tender, non-distended.  Peripheral vascular:  2+ popliteal pulses  2+ DP pulses Extremity: No edema. No cyanosis. No pallor.  Skin: No gangrene. No ulceration.  Lymphatic: No Stemmer's sign. No palpable lymphadenopathy.  PERTINENT LABORATORY AND RADIOLOGIC DATA  Most recent CBC CBC Latest Ref Rng & Units 02/19/2020 02/10/2020  02/10/2020  WBC 4.0 - 10.5 K/uL 8.9 - 7.8  Hemoglobin 12.0 - 15.0 g/dL 15.5(H) 16.3(H) 15.5(H)  Hematocrit 36.0 - 46.0 % 48.5(H) 48.0(H) 47.4(H)  Platelets 150 - 400 K/uL 255 - 230     Most recent CMP CMP Latest Ref Rng & Units 07/07/2020 05/03/2020 02/19/2020  Glucose 65 - 99 mg/dL 79 357(H) 275(H)  BUN 8 - 27 mg/dL 9 9 23   Creatinine 0.57 - 1.00 mg/dL 1.15(H) 1.24(H) 1.21(H)  Sodium 134 - 144 mmol/L 143 135 137  Potassium 3.5 - 5.2 mmol/L 4.5 4.6 3.8  Chloride 96 - 106 mmol/L 99 95(L) 97(L)  CO2 20 - 29 mmol/L 26 27 25   Calcium 8.7 - 10.3 mg/dL 9.5 9.8 9.1  Total Protein 6.0 - 8.5 g/dL 7.0 7.1 7.1  Total Bilirubin 0.0 - 1.2 mg/dL 0.3 0.7 1.5(H)  Alkaline Phos 44 - 121 IU/L 107 130(H) 94  AST 0 - 40 IU/L 19 12 17   ALT 0 - 32 IU/L 17 11 19     Renal function CrCl cannot be calculated (Patient's most recent lab result is older than the maximum 21 days allowed.).  Hgb A1c MFr Bld (%)  Date Value  07/07/2020 10.5 (H)    LDL Chol Calc (NIH)  Date Value Ref Range Status  12/31/2019 98 0 - 99 mg/dL Final   Direct LDL  Date Value Ref Range Status  10/09/2013 152.8 mg/dL Final    Comment:    Optimal:  <100 mg/dLNear or Above Optimal:  100-129 mg/dLBorderline High:  130-159 mg/dLHigh:  160-189 mg/dLVery High:  >190 mg/dL     Vascular Imaging:   Yevonne Aline. Stanford Breed, MD [facility] Phone Number: [phone] 11/29/2020 2:46 PM

## 2020-11-29 NOTE — ED Triage Notes (Signed)
Pt bib ems with reports of witnessed syncopal episode. Pt had just had blood drawn at dr office. Pt with episode of emesis. Pt does not recall the event. Pt arrives in [place]. Pt did endorse mild chest pain. 150/86 CBG 364 HR 82 NSR 99%

## 2020-11-29 NOTE — ED Notes (Signed)
Pt "partner" called wanting to be transferred to room, but phone isn't working so she would like to speak to her, told her nurse may let pt use the phone her number is [phone]

## 2020-11-30 ENCOUNTER — Encounter: Payer: Self-pay | Admitting: *Deleted

## 2020-11-30 ENCOUNTER — Observation Stay (HOSPITAL_BASED_OUTPATIENT_CLINIC_OR_DEPARTMENT_OTHER): Payer: Medicare Other

## 2020-11-30 ENCOUNTER — Telehealth: Payer: Self-pay

## 2020-11-30 DIAGNOSIS — R079 Chest pain, unspecified: Secondary | ICD-10-CM

## 2020-11-30 DIAGNOSIS — R55 Syncope and collapse: Principal | ICD-10-CM

## 2020-11-30 LAB — ECHOCARDIOGRAM COMPLETE
AV Mean grad: 11 mmHg
Area-P 1/2: 2.37 cm2
Height: 61 in
S' Lateral: 1.6 cm
Weight: 2352 oz

## 2020-11-30 LAB — CBC
HCT: 49.4 % — ABNORMAL HIGH (ref 36.0–46.0)
Hemoglobin: 16 g/dL — ABNORMAL HIGH (ref 12.0–15.0)
MCH: 30.7 pg (ref 26.0–34.0)
MCHC: 32.4 g/dL (ref 30.0–36.0)
MCV: 94.6 fL (ref 80.0–100.0)
Platelets: 255 10*3/uL (ref 150–400)
RBC: 5.22 MIL/uL — ABNORMAL HIGH (ref 3.87–5.11)
RDW: 12.2 % (ref 11.5–15.5)
WBC: 8.8 10*3/uL (ref 4.0–10.5)
nRBC: 0 % (ref 0.0–0.2)

## 2020-11-30 LAB — CBG MONITORING, ED
Glucose-Capillary: 174 mg/dL — ABNORMAL HIGH (ref 70–99)
Glucose-Capillary: 63 mg/dL — ABNORMAL LOW (ref 70–99)
Glucose-Capillary: 162 mg/dL — ABNORMAL HIGH (ref 70–99)
Glucose-Capillary: 222 mg/dL — ABNORMAL HIGH (ref 70–99)
Glucose-Capillary: 316 mg/dL — ABNORMAL HIGH (ref 70–99)
Glucose-Capillary: 278 mg/dL — ABNORMAL HIGH (ref 70–99)
Glucose-Capillary: 162 mg/dL — ABNORMAL HIGH (ref 70–99)

## 2020-11-30 LAB — BASIC METABOLIC PANEL
Anion gap: 8 (ref 5–15)
BUN: 17 mg/dL (ref 8–23)
CO2: 27 mmol/L (ref 22–32)
Calcium: 9.4 mg/dL (ref 8.9–10.3)
Chloride: 103 mmol/L (ref 98–111)
Creatinine, Ser: 1.17 mg/dL — ABNORMAL HIGH (ref 0.44–1.00)
GFR, Estimated: 51 mL/min — ABNORMAL LOW (ref >60)
Glucose, Bld: 166 mg/dL — ABNORMAL HIGH (ref 70–99)
Potassium: 3.5 mmol/L (ref 3.5–5.1)
Sodium: 138 mmol/L (ref 135–145)

## 2020-11-30 LAB — HIV ANTIBODY (ROUTINE TESTING W REFLEX): HIV Screen 4th Generation wRfx: NONREACTIVE

## 2020-11-30 LAB — HEMOGLOBIN A1C
Hgb A1c MFr Bld: 10.4 % — ABNORMAL HIGH (ref 4.8–5.6)
Mean Plasma Glucose: 251.78 mg/dL

## 2020-11-30 LAB — SARS CORONAVIRUS 2 (TAT 6-24 HRS): SARS Coronavirus 2: NEGATIVE

## 2020-11-30 LAB — D-DIMER, QUANTITATIVE: D-Dimer, Quant: 0.4 ug/mL-FEU (ref 0.00–0.50)

## 2020-11-30 MED ORDER — SODIUM CHLORIDE 0.9 % IV SOLN: INTRAVENOUS | Status: DC

## 2020-11-30 MED ORDER — PANTOPRAZOLE SODIUM 40 MG PO TBEC
40.0000 mg | DELAYED_RELEASE_TABLET | Freq: Two times a day (BID) | ORAL | Status: DC
  Administered 2020-11-30 – 2020-12-01 (×3): 40 mg via ORAL
  Filled 2020-11-30 (×3): qty 1

## 2020-11-30 MED ORDER — AMLODIPINE BESYLATE 5 MG PO TABS
5.0000 mg | ORAL_TABLET | Freq: Every day | ORAL | Status: DC
  Administered 2020-11-30: 5 mg via ORAL
  Filled 2020-11-30: qty 1

## 2020-11-30 NOTE — ED Notes (Signed)
Walked patient to the bathroom patient did well 

## 2020-11-30 NOTE — Consult Note (Signed)
Cardiology Consultation:   Patient ID: [name]; [id number]; [birth year]   Admit date: 11/29/2020 Date of Consult: 11/30/2020  Primary Care Provider: Minette [name], [place] Primary Cardiologist: Dr. Skeet Latch, MD   Patient Profile:   [name] is a 70 y.o. female with a hx of HTN, HLD, asthma, and DM2 who is being seen today for the evaluation of chest pain and syncope at the request of [name].  History of Present Illness:   [name] is a 70yo F with a hx as stated above who presented to [facility]/30/20 with syncope that occurred the day of presentation. Patient reports she was recently seen by her PCP with complaints of lower extremity pain felt to be secondary to neuropathy however she was referred to vascular surgery for further evaluation. She states that she was at her appointment yesterday and has been feeling fine however when she left the office and was waiting for her ride outside, she had a witnessed syncopal episode with vomiting.  Given this, EMS was called and she was brought to the ED for further evaluation.  She denies any prodromal symptoms such as dizziness, diaphoresis, nausea, vomiting, loss of balance, visual, weakness or speech changes.  She states this has been a chronic issue for her. She reports she previously worked at [employer] however was forced to resign approximately 4 years ago when she had a syncopal episode on the job. Ever since that time, she reports having at least one syncopal episode yearly.  Typically, episodes occur while she is standing.  She has been referred to a neurologist in the past with no specific etiology.  She does not drive due to her symptoms.  Cardiology has also been consulted due to reports of chest pain.  In discussion, she reports that she is relatively active at baseline.  She walks around her apartment complex and also participates in swimming at the [facility].  She states that she will occasionally get  anterior/left-sided chest pain which feels like a sharp stab that lasts a brief period and will dissipate on its own.  This has also been ongoing for many years.  She denies shortness of breath, orthopnea, LE edema.  Her biggest concern today is chronic fatigue and uncontrolled DM2.  She states she has had diabetes for well over 50 years and follows closely in the outpatient setting.  She seems frustrated with multiple medication changes however is willing to do what she is told  In the ED, HsT found to be negative x2 (6>>8). Head CT with no acute findings. Most recent EKG with NSR however new TWI in inferior leads. D-dimer normal at 0.40. Hb A1c elevated at 10.4. Echocardiogram performed today showed an LVEF at 70-75% with no RWMA, G1DD, trivial MR, aortic sclerosis with no stenosis with a gradient at 56mmHg, no LVOT obstruction. Vascular studies with TBI with no evidence of right or left arterial disease. Last carotid ultrasound 12/30/2017 with minimal RECA and minimal LECA (near normal).   She is followed by [name] however was last seen in 2017 for for routine follow up. She was initially seen 10/2015 after reports of intermittent chest pain and SOB. Echocardiogram 10/17/2015 showed an LVEF at 65-70% with G1DD. She was referred for an exercise stress test that was performed 11/23/2015 which was negative for ischemia and showed a normal LVEF. She was last seen by [name] 07/2016 and was doing well from a CV standpoint. Her BP was noted to be  well controlled. Her carvedilol was transitioned to nebivolol 20mg  QD as carvedilol felt could contribute to obesity. She was having issues with swallowing and dysphagia with plans for her to see GI. It appears that she wore a monitor 01/2018 that showed predominately NSR with no arrhythmias.  Most recent echo from 03/2019 with mild AS.   Past Medical History:  Diagnosis Date  . Aortic stenosis    mild on 04/07/19 echo  . Arthritis    L knee, hands, back   .  Asthma   . Diabetes mellitus type 2, insulin dependent ([place])   . Fibromyalgia   . Full dentures   . Full dentures   . GERD (gastroesophageal reflux disease)   . H/O hiatal hernia   . Headache    h/o migraines - was followed for a time with a wellness doctor  . History of esophageal dilatation   . History of rhabdomyolysis    03/ 2015  . Hyperlipidemia   . Hypertension   . Insulin pump in place    since 12/ 2014--  [employer]  . Lumbar disc herniation    right leg weakness  . Osteoarthritis of left knee, primary localized 08/17/2014  . 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  . Renal insufficiency   . Rhabdomyolysis 11/15/2013  . SUI (stress urinary incontinence, female)   . Syncope 12/27/2017  . Wears glasses   . Wears glasses     Past Surgical History:  Procedure Laterality Date  . BLADDER SUSPENSION N/A 03/09/2014   Procedure: CYSTOSCOPY/SLING;  Surgeon: Reece Packer, MD;  Location: [facility];  Service: Urology;  Laterality: N/A;  . CARDIAC CATHETERIZATION  07-20-2009   DR Shelva Majestic   MODERATE LVH/  NORMAL  LVEF/  NORMAL CORONARY AND RENAL ARTERIES  . CARPAL TUNNEL RELEASE Right 2000  . CHOLECYSTECTOMY  1990  . COLONOSCOPY WITH ESOPHAGOGASTRODUODENOSCOPY (EGD)  06-18-2002  . ESOPHAGOGASTRODUODENOSCOPY (EGD) WITH ESOPHAGEAL DILATION  06-12-2010  . EXCISION LEFT UPPER ARM MASS  01-29-2014  . EYE SURGERY  2010   laser on R eye  . INTERSTIM IMPLANT REMOVAL N/A 03/06/2019   Procedure: REMOVAL OF INTERSTIM IMPLANT;  Surgeon: Bjorn Loser, MD;  Location: [facility];  Service: Urology;  Laterality: N/A;  . KNEE ARTHROSCOPY Right 1989  &  2002  . LUMBAR LAMINECTOMY N/A 02/19/2020   Procedure: right L4 hemiaminectomy, microdiscectomy;  Surgeon: Marybelle Killings, MD;  Location: [place];  Service: Orthopedics;  Laterality: N/A;  . MULTIPLE TOOTH EXTRACTIONS    . NEGATIVE SLEEP STUDY  2012   PER PT  . PARTIAL KNEE ARTHROPLASTY Left 08/17/2014   Procedure: LEFT UNICOMPARTMENTAL  KNEE;  Surgeon: Johnny Bridge, MD;  Location: [place];  Service: Orthopedics;  Laterality: Left;     Prior to Admission medications   Medication Sig Start Date End Date Taking? Authorizing Provider  amLODipine (NORVASC) 10 MG tablet TAKE ONE TABLET BY MOUTH EVERYDAY AT BEDTIME Patient taking differently: Take 10 mg by mouth daily. TAKE ONE TABLET BY MOUTH EVERYDAY AT BEDTIME 05/12/20  Yes Gwendolyn Brine, FNP  docusate sodium (COLACE) 100 MG capsule Take 100 mg by mouth daily as needed for mild constipation.    Yes [provider]  FARXIGA 10 MG TABS tablet TAKE ONE TABLET BY MOUTH BEFORE BREAKFAST Patient taking differently: Take 10 mg by mouth daily. 11/24/20  Yes Gwendolyn Brine, FNP  HUMALOG 100 UNIT/ML injection INJECT 25 UNITS into THE SKIN WITH BREAKFAST AND SUPPER AND 35 UNITS WITH LUNCH Patient  taking differently: Inject 25-35 Units into the skin 3 (three) times daily with meals. Taking 25 units in the morning 35 units in the afternoon and 25 units in the evening 08/08/20  Yes Gwendolyn Brine, FNP  insulin degludec (TRESIBA FLEXTOUCH) 100 UNIT/ML FlexTouch Pen Inject 20 Units into the skin daily. Patient taking differently: Inject 18 Units into the skin daily. 09/26/20  Yes [name], MD  linaclotide [facility]) 145 MCG CAPS capsule Take 1 capsule (145 mcg total) by mouth daily before breakfast. 10/25/20  Yes Gwendolyn Brine, FNP  mirabegron ER (MYRBETRIQ) 50 MG TB24 tablet Take 1 tablet (50 mg total) by mouth daily. 05/27/19  Yes Gwendolyn Brine, FNP  Multiple Vitamin (MULTIVITAMIN WITH MINERALS) TABS tablet Take 1 tablet by mouth daily.   Yes [provider]  pravastatin (PRAVACHOL) 40 MG tablet TAKE ONE TABLET BY MOUTH EVERYDAY AT BEDTIME Patient taking differently: Take 40 mg by mouth daily. 10/24/20  Yes Gwendolyn Brine, FNP  pregabalin (LYRICA) 50 MG capsule Take 1 capsule (50 mg total) by mouth 3 (three) times daily. Patient taking differently: Take 50 mg by mouth daily.  08/21/20  Yes Gwendolyn Brine, FNP  spironolactone (ALDACTONE) 50 MG tablet TAKE ONE TABLET BY MOUTH EVERY MORNING Patient taking differently: Take 50 mg by mouth daily. 05/12/20  Yes Gwendolyn Brine, FNP  Vitamin D, Ergocalciferol, (DRISDOL) 1.25 MG (50000 UNIT) CAPS capsule TAKE ONE CAPSULE BY MOUTH ONCE WEEKLY ON WEDNESDAY Patient taking differently: Take 50,000 Units by mouth every 7 (seven) days. 10/24/20  Yes Gwendolyn Brine, FNP  Continuous Blood Gluc Receiver (FREESTYLE LIBRE READER) [place] 1 each by Does not apply route See admin instructions. CGM device-[employer], Historical, MD  Continuous Blood Gluc Sensor (FREESTYLE LIBRE 14 DAY SENSOR) MISC 1 each by Does not apply route See admin instructions. CGM 14-day sensors; send refills to [facility] (in [place])    [provider]  Insulin Pen Needle (BD PEN NEEDLE MICRO U/F) 32G X 6 MM MISC DX:E11.65 USE TO CHECK BLOOD SUGARS THREE TIMES A DAY 05/12/20   [name], MD  Lancets Misc. MISC 1 each by Does not apply route 4 (four) times daily -  with meals and at bedtime. 12/16/18   Gwendolyn Brine, FNP    Inpatient Medications: Scheduled Meds: . amLODipine  10 mg Oral QHS  . enoxaparin (LOVENOX) injection  40 mg Subcutaneous Q24H  . insulin aspart  0-9 Units Subcutaneous Q4H  . insulin glargine  15 Units Subcutaneous Daily  . linaclotide  145 mcg Oral QAC breakfast  . mirabegron ER  50 mg Oral Daily  . multivitamin with minerals  1 tablet Oral Daily  . pantoprazole  40 mg Oral BID  . pravastatin  40 mg Oral q1800  . pregabalin  50 mg Oral Daily  . sodium chloride flush  3 mL Intravenous Q12H  . Vitamin D (Ergocalciferol)  50,000 Units Oral Q7 days   Continuous Infusions: . sodium chloride 100 mL/hr at 11/30/20 1006   PRN Meds: acetaminophen **OR** acetaminophen, docusate sodium, ondansetron **OR** ondansetron (ZOFRAN) IV  Allergies:    Allergies  Allergen Reactions  . Pollen Extract Other (See Comments)     Congestion/sneezing  . Tomato Hives and Itching  . Codeine Hives, Itching and Nausea And Vomiting    Social History:   Social History   Socioeconomic History  . Marital status: Divorced    Spouse name: Not on file  . Number of children: Not on file  .  Years of education: Not on file  . Highest education level: Not on file  Occupational History  . Occupation: disability  Tobacco Use  . Smoking status: Never Smoker  . Smokeless tobacco: Never Used  Vaping Use  . Vaping Use: Never used  Substance and Sexual Activity  . Alcohol use: No  . Drug use: No  . Sexual activity: Not Currently  Other Topics Concern  . Not on file  Social History Narrative  . Not on file   Social Determinants of Health   Financial Resource Strain: Low Risk   . Difficulty of Paying Living Expenses: Not hard at all  Food Insecurity: No Food Insecurity  . Worried About Charity fundraiser in the Last Year: Never true  . Ran Out of Food in the Last Year: Never true  Transportation Needs: No Transportation Needs  . Lack of Transportation (Medical): No  . Lack of Transportation (Non-Medical): No  Physical Activity: Sufficiently Active  . Days of Exercise per Week: 7 days  . Minutes of Exercise per Session: 60 min  Stress: No Stress Concern Present  . Feeling of Stress : Not at all  Social Connections: Not on file  Intimate Partner Violence: Not on file    Family History:   Family History  Problem Relation Age of Onset  . Diabetes Mother   . Hypertension Mother   . Lung cancer Mother        smoked  . Diabetes Father   . Hypertension Father   . Lung cancer Father        smoked  . Diabetes Brother   . Heart failure Sister   . Diabetes Maternal Grandmother   . Cancer Maternal Grandmother    Family Status:  Family Status  Relation Name Status  . Mother  Deceased  . Father  Deceased  . Brother  (Not Specified)  . Sister  (Not Specified)  . MGM  (Not Specified)    ROS:  Please see the  history of present illness.  All other ROS reviewed and negative.     Physical Exam/Data:   Vitals:   11/30/20 1015 11/30/20 1030 11/30/20 1045 11/30/20 1100  BP: 128/71 138/65 120/84 (!) 104/91  Pulse: 81 85 (!) 110 89  Resp: 15 16 18  (!) 23  Temp:      TempSrc:      SpO2: 100% 99% 100% 100%   No intake or output data in the 24 hours ending 11/30/20 1130 There were no vitals filed for this visit. There is no height or weight on file to calculate BMI.   General: Well developed, well nourished, NAD Neck: Negative for carotid bruits. No JVD Lungs:Clear to ausculation bilaterally. No wheezes, rales, or rhonchi. Breathing is unlabored. Cardiovascular: RRR with S1 S2. No murmurs Abdomen: Soft, non-tender, non-distended. No obvious abdominal masses. Extremities: No edema. Radial pulses 2+ bilaterally Neuro: Alert and oriented. No focal deficits. No facial asymmetry. MAE spontaneously. Psych: Responds to questions appropriately with normal affect.    EKG:  The EKG was personally reviewed and demonstrates:  Most recent EKG 11/30/20 with NSR and TWI in inferior leads when compared to prior tracing on ED presentation  Telemetry:  Telemetry was personally reviewed and demonstrates: 11/30/20: NSR with HR 60-80  Relevant CV Studies:  Echocardiogram 11/30/20:  1. Elevated mid-cavitary gradient due to hyperdynamic LV function. Left  ventricular ejection fraction, by estimation, is 70 to 75%. The left  ventricle has hyperdynamic function. The left ventricle has  no regional  wall motion abnormalities. Left  ventricular diastolic parameters are consistent with Grade I diastolic  dysfunction (impaired relaxation).  2. Right ventricular systolic function is hyperdynamic. The right  ventricular size is normal.  3. The mitral valve is abnormal. Trivial mitral valve regurgitation.  4. The aortic valve is tricuspid. Aortic valve regurgitation is not  visualized. Mild aortic valve sclerosis is  present, with no evidence of  aortic valve stenosis. Aortic valve mean gradient measures 11.0 mmHg, but  this is primarily subvalvular  (intracavitary) -no dynamic LVOT obstruction is suspected. The aortic  valve appears to open freely.  5. The inferior vena cava is normal in size with greater than 50%  respiratory variability, suggesting right atrial pressure of 3 mmHg.   Comparison(s): Changes from prior study are noted. 04/07/2019: LVEF 60-65%,  mild aortic stenosis - mean gradient 6 mmHg.   Echo 03/2019:  1. The left ventricle has normal systolic function with an ejection  fraction of 60-65%. The cavity size was normal. There is mildly increased  left ventricular wall thickness. Left ventricular diastolic Doppler  parameters are consistent with impaired  relaxation.  2. The right ventricle has normal systolic function. The cavity was  normal. There is no increase in right ventricular wall thickness.  3. Mild thickening of the mitral valve leaflet.  4. The aortic valve is tricuspid. Moderate thickening of the aortic  valve. Moderate calcification of the aortic valve. Mild stenosis of the  aortic valve.  5. Calcified leaflet tip of non coronary cusp.  6. The aorta is normal in size and structure.   Syn  Laboratory Data:  Chemistry Recent Labs  Lab 11/29/20 1638 11/30/20 0135  NA 139 138  K 3.9 3.5  CL 102 103  CO2 30 27  GLUCOSE 253* 166*  BUN 17 17  CREATININE 1.27* 1.17*  CALCIUM 9.6 9.4  GFRNONAA 46* 51*  ANIONGAP 7 8    Total Protein  Date Value Ref Range Status  07/07/2020 7.0 6.0 - 8.5 g/dL Final   Albumin  Date Value Ref Range Status  07/07/2020 4.2 3.8 - 4.8 g/dL Final   AST  Date Value Ref Range Status  07/07/2020 19 0 - 40 IU/L Final   ALT  Date Value Ref Range Status  07/07/2020 17 0 - 32 IU/L Final   Alkaline Phosphatase  Date Value Ref Range Status  07/07/2020 107 44 - 121 IU/L Final    Comment:                  **Please note  reference interval change**   Bilirubin Total  Date Value Ref Range Status  07/07/2020 0.3 0.0 - 1.2 mg/dL Final   Hematology Recent Labs  Lab 11/29/20 1638 11/30/20 0135  WBC 7.4 8.8  RBC 5.18* 5.22*  HGB 16.1* 16.0*  HCT 49.2* 49.4*  MCV 95.0 94.6  MCH 31.1 30.7  MCHC 32.7 32.4  RDW 12.0 12.2  PLT 274 255   Cardiac EnzymesNo results for input(s): TROPONINI in the last 168 hours. No results for input(s): TROPIPOC in the last 168 hours.  BNPNo results for input(s): BNP, PROBNP in the last 168 hours.  DDimer  Recent Labs  Lab 11/30/20 1003  DDIMER 0.40   TSH:  Lab Results  Component Value Date   TSH 1.990 06/09/2018   Lipids: Lab Results  Component Value Date   CHOL 168 12/31/2019   HDL 54 12/31/2019   LDLCALC 98 12/31/2019   LDLDIRECT 152.8  10/09/2013   TRIG 88 12/31/2019   CHOLHDL 3.1 12/31/2019   HgbA1c: Lab Results  Component Value Date   HGBA1C 10.4 (H) 11/29/2020    Radiology/Studies:  DG Chest 1 View  Result Date: 11/29/2020 CLINICAL DATA:  Chest pain.  Syncopal episode. EXAM: CHEST  1 VIEW COMPARISON:  Chest x-ray 02/19/2020 FINDINGS: The cardiac silhouette, mediastinal and hilar contours are within normal limits. Mild calcification of the thoracic aorta is noted. The lungs are clear.  No pleural effusions.  No pulmonary lesions. The bony thorax is intact. IMPRESSION: No acute cardiopulmonary findings. Electronically Signed   By: Marijo Sanes M.D.   On: 11/29/2020 18:41   CT Head Wo Contrast  Result Date: 11/29/2020 CLINICAL DATA:  Syncope EXAM: CT HEAD WITHOUT CONTRAST TECHNIQUE: Contiguous axial images were obtained from the base of the skull through the vertex without intravenous contrast. COMPARISON:  04/06/2019 FINDINGS: Brain: There is atrophy and chronic small vessel disease changes. No acute intracranial abnormality. Specifically, no hemorrhage, hydrocephalus, mass lesion, acute infarction, or significant intracranial injury. Vascular: No  hyperdense vessel or unexpected calcification. Skull: No acute calvarial abnormality. Sinuses/Orbits: Visualized paranasal sinuses and mastoids clear. Orbital soft tissues unremarkable. Other: None IMPRESSION: Atrophy, chronic microvascular disease. No acute intracranial abnormality. Electronically Signed   By: Rolm Baptise M.D.   On: 11/29/2020 17:46   CT Cervical Spine Wo Contrast  Result Date: 11/29/2020 CLINICAL DATA:  Syncope EXAM: CT CERVICAL SPINE WITHOUT CONTRAST TECHNIQUE: Multidetector CT imaging of the cervical spine was performed without intravenous contrast. Multiplanar CT image reconstructions were also generated. COMPARISON:  04/06/2019 FINDINGS: Alignment: No subluxation Skull base and vertebrae: No acute fracture. No primary bone lesion or focal pathologic process. Soft tissues and spinal canal: No prevertebral fluid or swelling. No visible canal hematoma. Disc levels: Diffuse degenerative disc and facet disease, most pronounced in the lower cervical spine. Upper chest: Negative Other: None IMPRESSION: Cervical spondylosis.  No acute bony abnormality. Electronically Signed   By: Rolm Baptise M.D.   On: 11/29/2020 17:48   VAS [place] ABI WITH/WO TBI  Result Date: 11/29/2020 LOWER EXTREMITY DOPPLER STUDY Indications: Claudication. High Risk Factors: Hypertension, hyperlipidemia, Diabetes.  Comparison Study: None Performing Technologist: Ivan Croft  Examination Guidelines: A complete evaluation includes at minimum, Doppler waveform signals and systolic blood pressure reading at the level of bilateral brachial, anterior tibial, and posterior tibial arteries, when vessel segments are accessible. Bilateral testing is considered an integral part of a complete examination. Photoelectric Plethysmograph (PPG) waveforms and toe systolic pressure readings are included as required and additional duplex testing as needed. Limited examinations for reoccurring indications may be performed as noted.  ABI  Findings: +---------+------------------+-----+---------+--------+ Right    Rt Pressure (mmHg)IndexWaveform Comment  +---------+------------------+-----+---------+--------+ Brachial 177                                      +---------+------------------+-----+---------+--------+ PTA      201               1.13 triphasic         +---------+------------------+-----+---------+--------+ DP       211               1.19 triphasic         +---------+------------------+-----+---------+--------+ Great Toe167               0.94                   +---------+------------------+-----+---------+--------+ +---------+------------------+-----+---------+-------+  Left     Lt Pressure (mmHg)IndexWaveform Comment +---------+------------------+-----+---------+-------+ Brachial 178                                     +---------+------------------+-----+---------+-------+ PTA      181               1.02 triphasic        +---------+------------------+-----+---------+-------+ DP       177               0.99 triphasic        +---------+------------------+-----+---------+-------+ Great Toe160               0.90                  +---------+------------------+-----+---------+-------+ +-------+-----------+-----------+------------+------------+ ABI/TBIToday's ABIToday's TBIPrevious ABIPrevious TBI +-------+-----------+-----------+------------+------------+ Right  1.19       0.94                                +-------+-----------+-----------+------------+------------+ Left   1.02       0.90                                +-------+-----------+-----------+------------+------------+   Summary: Right: Resting right ankle-brachial index is within normal range. No evidence of significant right lower extremity arterial disease. The right toe-brachial index is normal. Left: Resting left ankle-brachial index is within normal range. No evidence of significant left lower extremity  arterial disease. The left toe-brachial index is normal.  *See table(s) above for measurements and observations.  Electronically signed by Jamelle Haring on 11/29/2020 at 2:36:07 PM.    Final    ECHOCARDIOGRAM COMPLETE  Result Date: 11/30/2020    ECHOCARDIOGRAM REPORT   Patient Name:   [name] Date of Exam: 11/30/2020 Medical Rec #:  [id number]           Height:       61.0 in Accession #:    [id number]          Weight:       147.0 lb Date of Birth:  04-27-1951          BSA:          1.657 m Patient Age:    60 years            BP:           133/69 mmHg Patient Gender: F                   HR:           78 bpm. Exam Location:  Inpatient Procedure: 2D Echo Indications:    Syncope R55; Chest Pain  History:        Patient has prior history of Echocardiogram examinations, most                 recent 04/07/2019. Risk Factors:Diabetes, Hypertension and                 Dyslipidemia.  Sonographer:    Mikki Santee RDCS (AE) Referring Phys: [place]  1. Elevated mid-cavitary gradient due to hyperdynamic LV function. Left ventricular ejection fraction, by estimation, is 70 to 75%. The left ventricle has hyperdynamic function. The left ventricle has no regional wall  motion abnormalities. Left ventricular diastolic parameters are consistent with Grade I diastolic dysfunction (impaired relaxation).  2. Right ventricular systolic function is hyperdynamic. The right ventricular size is normal.  3. The mitral valve is abnormal. Trivial mitral valve regurgitation.  4. The aortic valve is tricuspid. Aortic valve regurgitation is not visualized. Mild aortic valve sclerosis is present, with no evidence of aortic valve stenosis. Aortic valve mean gradient measures 11.0 mmHg, but this is primarily subvalvular (intracavitary) -no dynamic LVOT obstruction is suspected. The aortic valve appears to open freely.  5. The inferior vena cava is normal in size with greater than 50% respiratory variability,  suggesting right atrial pressure of 3 mmHg. Comparison(s): Changes from prior study are noted. 04/07/2019: LVEF 60-65%, mild aortic stenosis - mean gradient 6 mmHg. FINDINGS  Left Ventricle: Elevated mid-cavitary gradient due to hyperdynamic LV function. Left ventricular ejection fraction, by estimation, is 70 to 75%. The left ventricle has hyperdynamic function. The left ventricle has no regional wall motion abnormalities. The left ventricular internal cavity size was normal in size. There is no left ventricular hypertrophy. Left ventricular diastolic parameters are consistent with Grade I diastolic dysfunction (impaired relaxation). Indeterminate filling pressures. Right Ventricle: The right ventricular size is normal. No increase in right ventricular wall thickness. Right ventricular systolic function is hyperdynamic. Left Atrium: Left atrial size was normal in size. Right Atrium: Right atrial size was normal in size. Pericardium: There is no evidence of pericardial effusion. Mitral Valve: The mitral valve is abnormal. There is mild thickening of the mitral valve leaflet(s). Trivial mitral valve regurgitation. Tricuspid Valve: The tricuspid valve is grossly normal. Tricuspid valve regurgitation is trivial. Aortic Valve: The aortic valve is tricuspid. Aortic valve regurgitation is not visualized. Mild aortic valve sclerosis is present, with no evidence of aortic valve stenosis. Aortic valve mean gradient measures 11.0 mmHg. Pulmonic Valve: The pulmonic valve was grossly normal. Pulmonic valve regurgitation is not visualized. Aorta: The aortic root and ascending aorta are structurally normal, with no evidence of dilitation. Venous: The inferior vena cava is normal in size with greater than 50% respiratory variability, suggesting right atrial pressure of 3 mmHg. IAS/Shunts: No atrial level shunt detected by color flow Doppler.  LEFT VENTRICLE PLAX 2D LVIDd:         2.70 cm  Diastology LVIDs:         1.60 cm  LV e'  medial:    6.64 cm/s LV PW:         0.80 cm  LV E/e' medial:  11.7 LV IVS:        0.90 cm  LV e' lateral:   7.07 cm/s LVOT diam:     2.00 cm  LV E/e' lateral: 11.0 LV SV:         67 LV SV Index:   41 LVOT Area:     3.14 cm  RIGHT VENTRICLE RV S prime:     14.20 cm/s TAPSE (M-mode): 1.7 cm LEFT ATRIUM             Index       RIGHT ATRIUM          Index LA diam:        1.90 cm 1.15 cm/m  RA Area:     9.71 cm LA Vol (A2C):   20.8 ml 12.55 ml/m RA Volume:   15.60 ml 9.41 ml/m LA Vol (A4C):   17.0 ml 10.26 ml/m LA Biplane Vol: 19.3 ml 11.65 ml/m  AORTIC VALVE AV Mean  Grad: 11.0 mmHg LVOT Vmax:    109.00 cm/s LVOT Vmean:   64.800 cm/s LVOT VTI:     0.214 m  AORTA Ao Root diam: 2.30 cm Ao Asc diam:  2.70 cm MITRAL VALVE MV Area (PHT): 2.37 cm     SHUNTS MV Decel Time: 320 msec     Systemic VTI:  0.21 m MV E velocity: 77.80 cm/s   Systemic Diam: 2.00 cm MV A velocity: 106.00 cm/s MV E/A ratio:  0.73 Lyman Bishop MD Electronically signed by Lyman Bishop MD Signature Date/Time: 11/30/2020/10:38:58 AM    Final    Assessment and Plan:   1. Syncope: -Pt pesented to [facility] 11/30/[facility] with syncope that occurred the day of presentation.  Patient reports she was recently seen by her PCP with complaints of lower extremity pain felt to be secondary to neuropathy however she was referred to vascular surgery for further evaluation. She states that she was at her appointment yesterday and has been feeling fine however when she left the office and was waiting for her ride outside, she had a witnessed syncopal episode with vomiting.  -There were no prodromal symptoms such as dizziness, diaphoresis, nausea, vomiting, loss of balance, visual, weakness or speech changes>>reported as a chronic issue for her. She has had approximately 4 episodes over the last 4 years, each occurring approximately 1 year apart. Typically, episodes occur while she is standing with no prodromal symptoms.  -Previously referred to a neurologist with a  negative workup  -Head CT with no acute findings -Most recent EKG with NSR however new TWI in inferior leads. D-dimer normal at 0.40. Hb A1c elevated at 10.4. Echocardiogram performed today showed an LVEF at 70-75% with no RWMA, G1DD, trivial MR, aortic sclerosis with no stenosis with a gradient at 37mmHg, no LVOT obstruction.  -Vascular studies with TBI with no evidence of right or left arterial disease.  -Last carotid ultrasound 12/30/2017 with minimal RECA and minimal LECA (near normal).  -There is no clear indication as to why she continues to have recurrent syncope. Her cardiac workup over the years has been normal. Would plan to have her follow up with [name] and potentially performed CCTA.   2. Chest pain: -Cardiology has also been consulted due to reports of chest pain. In discussion, she reports that she is relatively active at baseline.  She walks around her apartment complex and also participates in swimming at the [employer].  She states that she will occasionally get anterior/left-sided chest pain which feels like a sharp stab that lasts a brief period and will dissipate on its own.  This has also been ongoing for many years.   -She was initially seen 10/2015 after reports of intermittent chest pain and SOB. Echocardiogram 10/17/2015 showed an LVEF at 65-70% with G1DD. She was referred for an exercise stress test that was performed 11/23/2015 which was negative for ischemia and showed a normal LVEF.  -HsT found to be negative x2 (6>>8).  Most recent EKG with NSR however new TWI in inferior leads. D-dimer normal at 0.40. Hb A1c elevated at 10.4.  -Echocardiogram performed today showed an LVEF at 70-75% with no RWMA, G1DD, trivial MR, aortic sclerosis with no stenosis with a gradient at 63mmHg, no LVOT obstruction.  -See plan as described above   3. HTN: -Stable, 135/78>>154/74>>104/91>>120/84 -Continue amlodipine 10, spironolactone 50  4. HLD: -Last LDL, 120 from 09/07/2016 -Repeat at  follow up  -Continue pravastatin   5. DM2: -HbA1c, 10.4 this admission  -On  home insulin  -Follows closely with OP IM    For questions or updates, please contact [place] Please consult [email or web address] for contact info under Cardiology/STEMI.   SignedKathyrn Drown NP-C [facility] Pager: [phone] 11/30/2020 11:30 AM

## 2020-11-30 NOTE — Progress Notes (Addendum)
Inpatient Diabetes Program Recommendations  AACE/ADA: New Consensus Statement on Inpatient Glycemic Control (2015)  Target Ranges:  Prepandial:   less than 140 mg/dL      Peak postprandial:   less than 180 mg/dL (1-2 hours)      Critically ill patients:  140 - 180 mg/dL   Results for [name], [name] (MRN [id number]) as of 11/30/2020 07:05  Ref. Range 11/29/2020 22:16 11/30/2020 01:18 11/30/2020 04:51 11/30/2020 05:58  Glucose-Capillary Latest Ref Range: 70 - 99 mg/dL 203 (H)  3  units NOVOLOG  174 (H)  2 units NOVOLOG  63 (L) 162 (H)   Results for [name], [name] (MRN [id number]) as of 11/30/2020 07:05  Ref. Range 12/31/2019 16:11 05/03/2020 14:55 07/07/2020 17:11 11/29/2020 21:47  Hemoglobin A1C Latest Ref Range: 4.8 - 5.6 % 5.9 (H) 11.4 (H) 10.5 (H) 10.4 (H)  (251 mg/dl)    Admit with: Syncope/ CP  History: DM  Home DM Meds: Farxiga 10 mg Daily       Humalog 25 units with Breakfast/ 35 units with Lunch/ 25 units with Dinner        Tresiba 18 units Daily        Freestyle Libre CGM  Current Orders: Lantus 15 units Daily      Novolog Sensitive Correction Scale/ SSI (0-9 units) Q4 hours     Due to start Lantus 15 units Daily this AM  Current A1c of 10.4% shows continued poor glucose control at home--Assisted by [facility] PCP: Gwendolyn Born, [name]  Will try to speak with pt today about her current A1c and home diabetes care regimen   Addendum 11:40am--Met with pt down in the ED this AM to discuss current A1c.  Pt told me she sees Gwendolyn Brine, [name] with [facility].  Also told me that Gwendolyn Dy, [name] with [facility] calls her several times per week to help her with her diabetes management.  Pt stated frustration with her Diabetes management.  Stated she has made good changes to her nutrition at home, takes her Insulin as prescribed, abstains from alcohol, exercises and seems to have continued  poor glucose control at home.  Told me she got her A1c down to the 7% range at one point, but frustrated that her A1c has been 10-11% most recently.  Checks CBGs at home frequently with her Freestyle Libre CGM--Takes CBM to all MD visits.  Encouraged pt to continue working with her PCP on blood sugar management--Has appt with PCP in May.  I discussed with pt that we have started her on Lantus and Novolog in the hospital (explained what each insulin is and how we are currently dosing) and also explained to pt that the team can make insulin adjustments based on her CBGs.  Pt appreciative of visit and did not have any questions for me at this time.    --Will follow patient during hospitalization--  Wyn Quaker RN, MSN, CDE Diabetes Coordinator Inpatient Glycemic Control Team Team Pager: [phone] (8a-5p)

## 2020-11-30 NOTE — Progress Notes (Signed)
  Echocardiogram 2D Echocardiogram has been performed.  Jennette Dubin 11/30/2020, 9:41 AM

## 2020-11-30 NOTE — Progress Notes (Signed)
PROGRESS NOTE    [name]  WLN:[id number] DOB: 10/31/1950 DOA: 11/29/2020 PCP: [name], FNP   Brief Narrative: 70 year old with past medical history significant for diabetes hypertension, hyperlipidemia, mild AS presented after syncopal episode.  Patient has had multiple episodes of syncope.  No visual changes no speech changes no unilateral weakness.  Patient also complaining of chronic right side chest pain intermittent. Evaluation in the ED troponin x2 -, creatinine 1.2, hemoglobin 16   Assessment & Plan:   Principal Problem:   Syncope Active Problems:   CHEST PAIN   Type II diabetes mellitus with renal manifestations (HCC)   HLD (hyperlipidemia)   HTN (hypertension)   Mild aortic stenosis  1-Syncope;  -Suspect orthostatic.  -Orthostatic Positive by HR.  -IV fluids overnight   2-Chest pain; Atypical. D dimer negative. Troponin negative Started PPI.  Evaluated by cardiology,   3-Hemo-concetration;  Started IV fluids.   4-DM type 2; Levemir and SSI.   5-HTN; continue with amlodipine. Hold spironolactone   Estimated body mass index is 27.78 kg/m as calculated from the following:   Height as of an earlier encounter on 11/29/20: 5\' 1"  (1.549 m).   Weight as of an earlier encounter on 11/29/20: 66.7 kg.   DVT prophylaxis: Lovenox Code Status: Full code Family Communication: care discussed with patient Disposition Plan:  Status is: Observation  The patient remains OBS appropriate and will d/c before 2 midnights.  Dispo: The patient is from: Home              Anticipated d/c is to: Home              Patient currently is not medically stable to d/c.   Difficult to place patient No        Consultants:   Cardiology   Procedures:   ECHO: Left Ventricle: Elevated mid-cavitary gradient due to hyperdynamic LV  function. Left ventricular ejection fraction, by estimation, is 70 to 75%.  The left ventricle has hyperdynamic function. The left  ventricle has no  regional wall motion abnormalities.  The left ventricular internal cavity size was normal in size. There is no  left ventricular hypertrophy. Left ventricular diastolic parameters are  consistent with Grade I diastolic dysfunction (impaired relaxation).  Indeterminate filling pressures.   Antimicrobials:    Subjective: She is feeling ok, just had echo and had pain right side after [place].  She report intermittent chest pain, worse at night when she is lying down. She has not been taking PPI.    Objective: Vitals:   11/30/20 1330 11/30/20 1345 11/30/20 1400 11/30/20 1415  BP: 140/76 136/75 139/90 132/78  Pulse: 87 81 86 90  Resp: 17 20 14 14   Temp:      TempSrc:      SpO2: 99% 100% 99% 98%    Intake/Output Summary (Last 24 hours) at 11/30/2020 1522 Last data filed at 11/30/2020 1429 Gross per 24 hour  Intake 7.99 ml  Output --  Net 7.99 ml   There were no vitals filed for this visit.  Examination:  General exam: Appears calm and comfortable  Respiratory system: Clear to auscultation. Respiratory effort normal. Cardiovascular system: S1 & S2 heard, RRR. Gastrointestinal system: Abdomen is nondistended, soft and nontender. No organomegaly or masses felt. Normal bowel sounds heard. Central nervous system: Alert and oriented. No focal neurological deficits. Extremities: Symmetric 5 x 5 power.    Data Reviewed: I have personally reviewed following labs and imaging studies  CBC: Recent Labs  Lab 11/29/20 1638 11/30/20 0135  WBC 7.4 8.8  NEUTROABS 5.5  --   HGB 16.1* 16.0*  HCT 49.2* 49.4*  MCV 95.0 94.6  PLT 274 937   Basic Metabolic Panel: Recent Labs  Lab 11/29/20 1638 11/30/20 0135  NA 139 138  K 3.9 3.5  CL 102 103  CO2 30 27  GLUCOSE 253* 166*  BUN 17 17  CREATININE 1.27* 1.17*  CALCIUM 9.6 9.4   GFR: Estimated Creatinine Clearance: 40.2 mL/min (A) (by C-G formula based on SCr of 1.17 mg/dL (H)). Liver Function Tests: No results for  input(s): AST, ALT, ALKPHOS, BILITOT, PROT, ALBUMIN in the last 168 hours. No results for input(s): LIPASE, AMYLASE in the last 168 hours. No results for input(s): AMMONIA in the last 168 hours. Coagulation Profile: No results for input(s): INR, PROTIME in the last 168 hours. Cardiac Enzymes: No results for input(s): CKTOTAL, CKMB, CKMBINDEX, TROPONINI in the last 168 hours. BNP (last 3 results) No results for input(s): PROBNP in the last 8760 hours. HbA1C: Recent Labs    11/29/20 2147  HGBA1C 10.4*   CBG: Recent Labs  Lab 11/29/20 2216 11/30/20 0118 11/30/20 0451 11/30/20 0558 11/30/20 0734  GLUCAP 203* 174* 63* 162* 222*   Lipid Profile: No results for input(s): CHOL, HDL, LDLCALC, TRIG, CHOLHDL, LDLDIRECT in the last 72 hours. Thyroid Function Tests: No results for input(s): TSH, T4TOTAL, FREET4, T3FREE, THYROIDAB in the last 72 hours. Anemia Panel: No results for input(s): VITAMINB12, FOLATE, FERRITIN, TIBC, IRON, RETICCTPCT in the last 72 hours. Sepsis Labs: No results for input(s): PROCALCITON, LATICACIDVEN in the last 168 hours.  Recent Results (from the past 240 hour(s))  SARS CORONAVIRUS 2 (TAT 6-24 HRS) Nasopharyngeal Nasopharyngeal Swab     Status: None   Collection Time: 11/29/20  9:38 PM   Specimen: Nasopharyngeal Swab  Result Value Ref Range Status   SARS Coronavirus 2 NEGATIVE NEGATIVE Final    Comment: (NOTE) SARS-CoV-2 target nucleic acids are NOT DETECTED.  The SARS-CoV-2 RNA is generally detectable in upper and lower respiratory specimens during the acute phase of infection. Negative results do not preclude SARS-CoV-2 infection, do not rule out co-infections with other pathogens, and should not be used as the sole basis for treatment or other patient management decisions. Negative results must be combined with clinical observations, patient history, and epidemiological information. The expected result is Negative.  Fact Sheet for  Patients: SugarRoll.be  Fact Sheet for Healthcare Providers: https://www.woods-mathews.com/  This test is not yet approved or cleared by the [place] FDA and  has been authorized for detection and/or diagnosis of SARS-CoV-2 by FDA under an Emergency Use Authorization (EUA). This EUA will remain  in effect (meaning this test can be used) for the duration of the COVID-19 declaration under Se ction 564(b)(1) of the Act, 21 U.S.C. section 360bbb-3(b)(1), unless the authorization is terminated or revoked sooner.  Performed at [facility], [place] [address] [place], [place] [address]          Radiology Studies: DG Chest 1 View  Result Date: 11/29/2020 CLINICAL DATA:  Chest pain.  Syncopal episode. EXAM: CHEST  1 VIEW COMPARISON:  Chest x-ray 02/19/2020 FINDINGS: The cardiac silhouette, mediastinal and hilar contours are within normal limits. Mild calcification of the thoracic aorta is noted. The lungs are clear.  No pleural effusions.  No pulmonary lesions. The bony thorax is intact. IMPRESSION: No acute cardiopulmonary findings. Electronically Signed   By: Marijo Sanes M.D.   On:  11/29/2020 18:41   CT Head Wo Contrast  Result Date: 11/29/2020 CLINICAL DATA:  Syncope EXAM: CT HEAD WITHOUT CONTRAST TECHNIQUE: Contiguous axial images were obtained from the base of the skull through the vertex without intravenous contrast. COMPARISON:  04/06/2019 FINDINGS: Brain: There is atrophy and chronic small vessel disease changes. No acute intracranial abnormality. Specifically, no hemorrhage, hydrocephalus, mass lesion, acute infarction, or significant intracranial injury. Vascular: No hyperdense vessel or unexpected calcification. Skull: No acute calvarial abnormality. Sinuses/Orbits: Visualized paranasal sinuses and mastoids clear. Orbital soft tissues unremarkable. Other: None IMPRESSION: Atrophy, chronic microvascular disease. No acute intracranial  abnormality. Electronically Signed   By: Rolm Baptise M.D.   On: 11/29/2020 17:46   CT Cervical Spine Wo Contrast  Result Date: 11/29/2020 CLINICAL DATA:  Syncope EXAM: CT CERVICAL SPINE WITHOUT CONTRAST TECHNIQUE: Multidetector CT imaging of the cervical spine was performed without intravenous contrast. Multiplanar CT image reconstructions were also generated. COMPARISON:  04/06/2019 FINDINGS: Alignment: No subluxation Skull base and vertebrae: No acute fracture. No primary bone lesion or focal pathologic process. Soft tissues and spinal canal: No prevertebral fluid or swelling. No visible canal hematoma. Disc levels: Diffuse degenerative disc and facet disease, most pronounced in the lower cervical spine. Upper chest: Negative Other: None IMPRESSION: Cervical spondylosis.  No acute bony abnormality. Electronically Signed   By: Rolm Baptise M.D.   On: 11/29/2020 17:48   VAS [place] ABI WITH/WO TBI  Result Date: 11/29/2020 LOWER EXTREMITY DOPPLER STUDY Indications: Claudication. High Risk Factors: Hypertension, hyperlipidemia, Diabetes.  Comparison Study: None Performing Technologist: Ivan Croft  Examination Guidelines: A complete evaluation includes at minimum, Doppler waveform signals and systolic blood pressure reading at the level of bilateral brachial, anterior tibial, and posterior tibial arteries, when vessel segments are accessible. Bilateral testing is considered an integral part of a complete examination. Photoelectric Plethysmograph (PPG) waveforms and toe systolic pressure readings are included as required and additional duplex testing as needed. Limited examinations for reoccurring indications may be performed as noted.  ABI Findings: +---------+------------------+-----+---------+--------+ Right    Rt Pressure (mmHg)IndexWaveform Comment  +---------+------------------+-----+---------+--------+ Brachial 177                                       +---------+------------------+-----+---------+--------+ PTA      201               1.13 triphasic         +---------+------------------+-----+---------+--------+ DP       211               1.19 triphasic         +---------+------------------+-----+---------+--------+ Great Toe167               0.94                   +---------+------------------+-----+---------+--------+ +---------+------------------+-----+---------+-------+ Left     Lt Pressure (mmHg)IndexWaveform Comment +---------+------------------+-----+---------+-------+ Brachial 178                                     +---------+------------------+-----+---------+-------+ PTA      181               1.02 triphasic        +---------+------------------+-----+---------+-------+ DP       177  0.99 triphasic        +---------+------------------+-----+---------+-------+ Great Toe160               0.90                  +---------+------------------+-----+---------+-------+ +-------+-----------+-----------+------------+------------+ ABI/TBIToday's ABIToday's TBIPrevious ABIPrevious TBI +-------+-----------+-----------+------------+------------+ Right  1.19       0.94                                +-------+-----------+-----------+------------+------------+ Left   1.02       0.90                                +-------+-----------+-----------+------------+------------+   Summary: Right: Resting right ankle-brachial index is within normal range. No evidence of significant right lower extremity arterial disease. The right toe-brachial index is normal. Left: Resting left ankle-brachial index is within normal range. No evidence of significant left lower extremity arterial disease. The left toe-brachial index is normal.  *See table(s) above for measurements and observations.  Electronically signed by Jamelle Haring on 11/29/2020 at 2:36:07 PM.    Final    ECHOCARDIOGRAM COMPLETE  Result Date:  11/30/2020    ECHOCARDIOGRAM REPORT   Patient Name:   [name] Date of Exam: 11/30/2020 Medical Rec #:  [id number]           Height:       61.0 in Accession #:    [id number]          Weight:       147.0 lb Date of Birth:  1951-08-19          BSA:          1.657 m Patient Age:    34 years            BP:           133/69 mmHg Patient Gender: F                   HR:           78 bpm. Exam Location:  Inpatient Procedure: 2D Echo Indications:    Syncope R55; Chest Pain  History:        Patient has prior history of Echocardiogram examinations, most                 recent 04/07/2019. Risk Factors:Diabetes, Hypertension and                 Dyslipidemia.  Sonographer:    Mikki Santee RDCS (AE) Referring Phys: [place]  1. Elevated mid-cavitary gradient due to hyperdynamic LV function. Left ventricular ejection fraction, by estimation, is 70 to 75%. The left ventricle has hyperdynamic function. The left ventricle has no regional wall motion abnormalities. Left ventricular diastolic parameters are consistent with Grade I diastolic dysfunction (impaired relaxation).  2. Right ventricular systolic function is hyperdynamic. The right ventricular size is normal.  3. The mitral valve is abnormal. Trivial mitral valve regurgitation.  4. The aortic valve is tricuspid. Aortic valve regurgitation is not visualized. Mild aortic valve sclerosis is present, with no evidence of aortic valve stenosis. Aortic valve mean gradient measures 11.0 mmHg, but this is primarily subvalvular (intracavitary) -no dynamic LVOT obstruction is suspected. The aortic valve appears to open freely.  5. The inferior vena cava is normal in size  with greater than 50% respiratory variability, suggesting right atrial pressure of 3 mmHg. Comparison(s): Changes from prior study are noted. 04/07/2019: LVEF 60-65%, mild aortic stenosis - mean gradient 6 mmHg. FINDINGS  Left Ventricle: Elevated mid-cavitary gradient due to hyperdynamic  LV function. Left ventricular ejection fraction, by estimation, is 70 to 75%. The left ventricle has hyperdynamic function. The left ventricle has no regional wall motion abnormalities. The left ventricular internal cavity size was normal in size. There is no left ventricular hypertrophy. Left ventricular diastolic parameters are consistent with Grade I diastolic dysfunction (impaired relaxation). Indeterminate filling pressures. Right Ventricle: The right ventricular size is normal. No increase in right ventricular wall thickness. Right ventricular systolic function is hyperdynamic. Left Atrium: Left atrial size was normal in size. Right Atrium: Right atrial size was normal in size. Pericardium: There is no evidence of pericardial effusion. Mitral Valve: The mitral valve is abnormal. There is mild thickening of the mitral valve leaflet(s). Trivial mitral valve regurgitation. Tricuspid Valve: The tricuspid valve is grossly normal. Tricuspid valve regurgitation is trivial. Aortic Valve: The aortic valve is tricuspid. Aortic valve regurgitation is not visualized. Mild aortic valve sclerosis is present, with no evidence of aortic valve stenosis. Aortic valve mean gradient measures 11.0 mmHg. Pulmonic Valve: The pulmonic valve was grossly normal. Pulmonic valve regurgitation is not visualized. Aorta: The aortic root and ascending aorta are structurally normal, with no evidence of dilitation. Venous: The inferior vena cava is normal in size with greater than 50% respiratory variability, suggesting right atrial pressure of 3 mmHg. IAS/Shunts: No atrial level shunt detected by color flow Doppler.  LEFT VENTRICLE PLAX 2D LVIDd:         2.70 cm  Diastology LVIDs:         1.60 cm  LV e' medial:    6.64 cm/s LV PW:         0.80 cm  LV E/e' medial:  11.7 LV IVS:        0.90 cm  LV e' lateral:   7.07 cm/s LVOT diam:     2.00 cm  LV E/e' lateral: 11.0 LV SV:         67 LV SV Index:   41 LVOT Area:     3.14 cm  RIGHT VENTRICLE RV  S prime:     14.20 cm/s TAPSE (M-mode): 1.7 cm LEFT ATRIUM             Index       RIGHT ATRIUM          Index LA diam:        1.90 cm 1.15 cm/m  RA Area:     9.71 cm LA Vol (A2C):   20.8 ml 12.55 ml/m RA Volume:   15.60 ml 9.41 ml/m LA Vol (A4C):   17.0 ml 10.26 ml/m LA Biplane Vol: 19.3 ml 11.65 ml/m  AORTIC VALVE AV Mean Grad: 11.0 mmHg LVOT Vmax:    109.00 cm/s LVOT Vmean:   64.800 cm/s LVOT VTI:     0.214 m  AORTA Ao Root diam: 2.30 cm Ao Asc diam:  2.70 cm MITRAL VALVE MV Area (PHT): 2.37 cm     SHUNTS MV Decel Time: 320 msec     Systemic VTI:  0.21 m MV E velocity: 77.80 cm/s   Systemic Diam: 2.00 cm MV A velocity: 106.00 cm/s MV E/A ratio:  0.73 Lyman Bishop MD Electronically signed by Lyman Bishop MD Signature Date/Time: 11/30/2020/10:38:58 AM    Final  Scheduled Meds: . amLODipine  10 mg Oral QHS  . enoxaparin (LOVENOX) injection  40 mg Subcutaneous Q24H  . insulin aspart  0-9 Units Subcutaneous Q4H  . insulin glargine  15 Units Subcutaneous Daily  . linaclotide  145 mcg Oral QAC breakfast  . mirabegron ER  50 mg Oral Daily  . multivitamin with minerals  1 tablet Oral Daily  . pantoprazole  40 mg Oral BID  . pravastatin  40 mg Oral q1800  . pregabalin  50 mg Oral Daily  . sodium chloride flush  3 mL Intravenous Q12H  . Vitamin D (Ergocalciferol)  50,000 Units Oral Q7 days   Continuous Infusions: . sodium chloride Stopped (11/30/20 1429)     LOS: 0 days    Time spent: 35 minutes    Tonnette Zwiebel A Isys Tietje, MD [facility]   If 7PM-7AM, please contact night-coverage [email or web address]  11/30/2020, 3:22 PM

## 2020-11-30 NOTE — ED Notes (Signed)
Pt called out and stated " I think my blood sugar is dropping". Pt BS obtain and it was 63. Pt given OJ and graham crackers.

## 2020-11-30 NOTE — Telephone Encounter (Signed)
  Chronic Care Management   Outreach Note  11/30/2020 Name: [name] MRN: [id number] DOB: 11-Jan-1951  Referred by: [name], FNP Reason for referral : Chronic Care Management (Inbound call from patient )   Received voice message from patient stating she is currently in the hospital. She stated she will call this RN CM once she is discharged home. The patient was referred to the case management team for assistance with care management and care coordination.   Follow Up Plan: The care management team will reach out to the patient once notified of her discharge home.  [name], RN, BSN, CCM Care Management Coordinator [place] [facility]  Direct Phone: [phone]

## 2020-11-30 NOTE — Progress Notes (Signed)
Person came into lobby stating someone was in the parking lot needing assistance. Responded along with Eilleen Kempf  RN.   Patient was sitting on the cement pylon in front of building.  A visitor was supporting patients back.  Patient was unresponsive. No fall had been witnessed.  Called 911. Monitored patient until EMS arrived. Patient aroused and was able to identify herself.  She stated that she had not felt well while outside waiting for her transportation. EMS arrived and transported patient to [facility] ED for evaluation.

## 2020-12-01 DIAGNOSIS — R55 Syncope and collapse: Secondary | ICD-10-CM | POA: Diagnosis not present

## 2020-12-01 LAB — CBC
HCT: 42.7 % (ref 36.0–46.0)
Hemoglobin: 13.9 g/dL (ref 12.0–15.0)
MCH: 30.8 pg (ref 26.0–34.0)
MCHC: 32.6 g/dL (ref 30.0–36.0)
MCV: 94.5 fL (ref 80.0–100.0)
Platelets: 238 10*3/uL (ref 150–400)
RBC: 4.52 MIL/uL (ref 3.87–5.11)
RDW: 12.3 % (ref 11.5–15.5)
WBC: 4.3 10*3/uL (ref 4.0–10.5)
nRBC: 0 % (ref 0.0–0.2)

## 2020-12-01 LAB — BASIC METABOLIC PANEL
Anion gap: 6 (ref 5–15)
BUN: 10 mg/dL (ref 8–23)
CO2: 29 mmol/L (ref 22–32)
Calcium: 8.7 mg/dL — ABNORMAL LOW (ref 8.9–10.3)
Chloride: 104 mmol/L (ref 98–111)
Creatinine, Ser: 1.04 mg/dL — ABNORMAL HIGH (ref 0.44–1.00)
GFR, Estimated: 59 mL/min — ABNORMAL LOW (ref >60)
Glucose, Bld: 170 mg/dL — ABNORMAL HIGH (ref 70–99)
Potassium: 3.8 mmol/L (ref 3.5–5.1)
Sodium: 139 mmol/L (ref 135–145)

## 2020-12-01 LAB — GLUCOSE, CAPILLARY
Glucose-Capillary: 89 mg/dL (ref 70–99)
Glucose-Capillary: 111 mg/dL — ABNORMAL HIGH (ref 70–99)
Glucose-Capillary: 141 mg/dL — ABNORMAL HIGH (ref 70–99)

## 2020-12-01 MED ORDER — ASPIRIN EC 81 MG PO TBEC
81.0000 mg | DELAYED_RELEASE_TABLET | Freq: Every day | ORAL | Status: DC
  Administered 2020-12-01: 81 mg via ORAL
  Filled 2020-12-01: qty 1

## 2020-12-01 MED ORDER — ASPIRIN 81 MG PO TBEC: 81.0000 mg | DELAYED_RELEASE_TABLET | Freq: Every day | ORAL | 11 refills | Status: DC

## 2020-12-01 MED ORDER — TRESIBA FLEXTOUCH 100 UNIT/ML ~~LOC~~ SOPN: 15.0000 [IU] | PEN_INJECTOR | Freq: Every day | SUBCUTANEOUS | 1 refills | Status: DC

## 2020-12-01 MED ORDER — PANTOPRAZOLE SODIUM 40 MG PO TBEC
40.0000 mg | DELAYED_RELEASE_TABLET | Freq: Two times a day (BID) | ORAL | 1 refills | Status: DC
Start: 2020-12-01 — End: 2020-12-29

## 2020-12-01 MED ORDER — AMLODIPINE BESYLATE 5 MG PO TABS: 5.0000 mg | ORAL_TABLET | Freq: Every day | ORAL | 3 refills | Status: DC

## 2020-12-01 NOTE — Plan of Care (Signed)

## 2020-12-01 NOTE — TOC Transition Note (Signed)
Transition of Care [facility]) - CM/SW Discharge Note   Patient Details  Name: [name] MRN: [id number] Date of Birth: November 18, 1950  Transition of Care [facility]) CM/SW Contact:  Zenon Mayo, RN Phone Number: 12/01/2020, 1:26 PM   Clinical Narrative:    NCM spoke with patient at bedsided, informed her that physical therapy recs SNF for her.  She is refusing SNF , she wants to go home with HHPT.  She does not have a preference of the [facility] agency.  NCM  Made referral to [facility] with Alvis Lemmings , he is able to take referral for HHPT.  Soc will begin 24 to 48 hrs post dc.    Final next level of care: [place] Barriers to Discharge: No Barriers Identified   Patient Goals and CMS Choice Patient states their goals for this hospitalization and ongoing recovery are:: return home with [facility] CMS Medicare.gov Compare Post Acute Care list provided to:: Patient Choice offered to / list presented to : Patient  Discharge Placement                       Discharge Plan and Services                  DME Agency: NA       HH Arranged: PT HH Agency: [place] Date [place]: 12/01/20 Time [place]: 1326 Representative spoke with at [place]: [place] ([place]) Interventions     Readmission Risk Interventions No flowsheet data found.

## 2020-12-01 NOTE — Progress Notes (Signed)
D/C instructions given and reviewed. Tele and IV removed, tolerated well. Family will transport home. 

## 2020-12-01 NOTE — Discharge Summary (Signed)
Physician Discharge Summary  [name] GQQ:[id number] DOB: [birth year] DOA: 11/29/2020  PCP: [name], FNP  Admit date: 11/29/2020 Discharge date: 12/01/2020  Admitted From: Home  Disposition:  Home   Recommendations for Outpatient Follow-up:  1. Follow up with PCP in 1-2 weeks. 2. Please obtain BMP/CBC in one week. 3. Repeat kidney function and CBC and follow BP to determine if spironolactone need to be resume.    Home Health: Yes   Discharge Condition: Stable.  CODE STATUS: Full code Diet recommendation: Heart Healthy  Brief/Interim Summary: 70 year old with past medical history significant for diabetes hypertension, hyperlipidemia, mild AS presented after syncopal episode.  Patient has had multiple episodes of syncope.  No visual changes no speech changes no unilateral weakness.  Patient also complaining of chronic right side chest pain intermittent. Evaluation in the ED troponin x2 -, creatinine 1.2, hemoglobin 16   1-Syncope;  -Suspect orthostatic, dehydration.  -Orthostatic Positive by HR.  -Received IV fluids overnight.  -Orthostatic vitals negative.  -Plan for home with HH> decline SNF. -Hold spironolactone.   2-Chest pain; Atypical. D dimer negative. Troponin negative Started PPI.  Evaluated by cardiology, no need for further cardiac evaluation.   3-Hemo-concetration;  Started IV fluids.  Likely related to dehydration.   4-DM type 2; Levemir and SSI. Hold Humolog at discharge/ she has not required short acting.   5-HTN; Continue with amlodipine. Hold spironolactone 6-Patient was evaluated by PT and was found to be unsteady, patient report having this problem for years. She has had prior MRI and evaluation. She would agree with [facility] PT, decline SNF  Discharge Diagnoses:  Principal Problem:   Syncope Active Problems:   CHEST PAIN   Type II diabetes mellitus with renal manifestations (HCC)   HLD (hyperlipidemia)   HTN (hypertension)   Mild aortic  stenosis    Discharge Instructions  Discharge Instructions    Diet - low sodium heart healthy   Complete by: As directed    Increase activity slowly   Complete by: As directed      Allergies as of 12/01/2020      Reactions   Pollen Extract Other (See Comments)   Congestion/sneezing   Tomato Hives, Itching   Codeine Hives, Itching, Nausea And Vomiting      Medication List    STOP taking these medications   HumaLOG 100 UNIT/ML injection Generic drug: insulin lispro   spironolactone 50 MG tablet Commonly known as: ALDACTONE     TAKE these medications   amLODipine 5 MG tablet Commonly known as: NORVASC Take 1 tablet (5 mg total) by mouth at bedtime. What changed:   medication strength  how much to take  how to take this  when to take this  additional instructions   aspirin 81 MG EC tablet Take 1 tablet (81 mg total) by mouth daily. Swallow whole.   BD Pen Needle Micro U/F 32G X 6 MM Misc Generic drug: Insulin Pen Needle DX:E11.65 USE TO CHECK BLOOD SUGARS THREE TIMES A DAY   docusate sodium 100 MG capsule Commonly known as: COLACE Take 100 mg by mouth daily as needed for mild constipation.   Farxiga 10 MG Tabs tablet Generic drug: dapagliflozin propanediol TAKE ONE TABLET BY MOUTH BEFORE BREAKFAST What changed: See the new instructions.   FreeStyle Libre 14 Day Sensor Misc 1 each by Does not apply route See admin instructions. CGM 14-day sensors; send refills to [facility] (in [place])   [employer] 1 each by Does  not apply route See admin instructions. CGM device-Freestyle Government social research officer. Misc 1 each by Does not apply route 4 (four) times daily -  with meals and at bedtime.   linaclotide 145 MCG Caps capsule Commonly known as: Linzess Take 1 capsule (145 mcg total) by mouth daily before breakfast.   mirabegron ER 50 MG Tb24 tablet Commonly known as: Myrbetriq Take 1 tablet (50 mg total) by mouth daily.   multivitamin  with minerals Tabs tablet Take 1 tablet by mouth daily.   pantoprazole 40 MG tablet Commonly known as: PROTONIX Take 1 tablet (40 mg total) by mouth 2 (two) times daily.   pravastatin 40 MG tablet Commonly known as: PRAVACHOL TAKE ONE TABLET BY MOUTH EVERYDAY AT BEDTIME What changed: See the new instructions.   pregabalin 50 MG capsule Commonly known as: Lyrica Take 1 capsule (50 mg total) by mouth 3 (three) times daily. What changed: when to take this   [name] 100 UNIT/ML FlexTouch Pen Generic drug: insulin degludec Inject 15 Units into the skin daily. What changed: how much to take   Vitamin D (Ergocalciferol) 1.25 MG (50000 UNIT) Caps capsule Commonly known as: DRISDOL TAKE ONE CAPSULE BY MOUTH ONCE WEEKLY ON WEDNESDAY What changed: See the new instructions.       Follow-up Information    [name], FNP. Go on 01/05/2021.   Specialty: General Practice Why: @2 :15pm Contact information: [address] [place] [address] [place], [facility] Follow up.   Specialty: Home Health Services Why: HHPT Contact information: [address] [place] [address] [phone]              Allergies  Allergen Reactions  . Pollen Extract Other (See Comments)    Congestion/sneezing  . Tomato Hives and Itching  . Codeine Hives, Itching and Nausea And Vomiting    Consultations:  Cardiology    Procedures/Studies: DG Chest 1 View  Result Date: 11/29/2020 CLINICAL DATA:  Chest pain.  Syncopal episode. EXAM: CHEST  1 VIEW COMPARISON:  Chest x-ray 02/19/2020 FINDINGS: The cardiac silhouette, mediastinal and hilar contours are within normal limits. Mild calcification of the thoracic aorta is noted. The lungs are clear.  No pleural effusions.  No pulmonary lesions. The bony thorax is intact. IMPRESSION: No acute cardiopulmonary findings. Electronically Signed   By: Marijo Sanes M.D.   On: 11/29/2020 18:41    CT Head Wo Contrast  Result Date: 11/29/2020 CLINICAL DATA:  Syncope EXAM: CT HEAD WITHOUT CONTRAST TECHNIQUE: Contiguous axial images were obtained from the base of the skull through the vertex without intravenous contrast. COMPARISON:  04/06/2019 FINDINGS: Brain: There is atrophy and chronic small vessel disease changes. No acute intracranial abnormality. Specifically, no hemorrhage, hydrocephalus, mass lesion, acute infarction, or significant intracranial injury. Vascular: No hyperdense vessel or unexpected calcification. Skull: No acute calvarial abnormality. Sinuses/Orbits: Visualized paranasal sinuses and mastoids clear. Orbital soft tissues unremarkable. Other: None IMPRESSION: Atrophy, chronic microvascular disease. No acute intracranial abnormality. Electronically Signed   By: Rolm Baptise M.D.   On: 11/29/2020 17:46   CT Cervical Spine Wo Contrast  Result Date: 11/29/2020 CLINICAL DATA:  Syncope EXAM: CT CERVICAL SPINE WITHOUT CONTRAST TECHNIQUE: Multidetector CT imaging of the cervical spine was performed without intravenous contrast. Multiplanar CT image reconstructions were also generated. COMPARISON:  04/06/2019 FINDINGS: Alignment: No subluxation Skull base and vertebrae: No acute fracture. No primary bone lesion or focal pathologic process. Soft  tissues and spinal canal: No prevertebral fluid or swelling. No visible canal hematoma. Disc levels: Diffuse degenerative disc and facet disease, most pronounced in the lower cervical spine. Upper chest: Negative Other: None IMPRESSION: Cervical spondylosis.  No acute bony abnormality. Electronically Signed   By: Rolm Baptise M.D.   On: 11/29/2020 17:48   VAS [place] ABI WITH/WO TBI  Result Date: 11/29/2020 LOWER EXTREMITY DOPPLER STUDY Indications: Claudication. High Risk Factors: Hypertension, hyperlipidemia, Diabetes.  Comparison Study: None Performing Technologist: Ivan Croft  Examination Guidelines: A complete evaluation includes at minimum,  Doppler waveform signals and systolic blood pressure reading at the level of bilateral brachial, anterior tibial, and posterior tibial arteries, when vessel segments are accessible. Bilateral testing is considered an integral part of a complete examination. Photoelectric Plethysmograph (PPG) waveforms and toe systolic pressure readings are included as required and additional duplex testing as needed. Limited examinations for reoccurring indications may be performed as noted.  ABI Findings: +---------+------------------+-----+---------+--------+ Right    Rt Pressure (mmHg)IndexWaveform Comment  +---------+------------------+-----+---------+--------+ Brachial 177                                      +---------+------------------+-----+---------+--------+ PTA      201               1.13 triphasic         +---------+------------------+-----+---------+--------+ DP       211               1.19 triphasic         +---------+------------------+-----+---------+--------+ Great Toe167               0.94                   +---------+------------------+-----+---------+--------+ +---------+------------------+-----+---------+-------+ Left     Lt Pressure (mmHg)IndexWaveform Comment +---------+------------------+-----+---------+-------+ Brachial 178                                     +---------+------------------+-----+---------+-------+ PTA      181               1.02 triphasic        +---------+------------------+-----+---------+-------+ DP       177               0.99 triphasic        +---------+------------------+-----+---------+-------+ Great Toe160               0.90                  +---------+------------------+-----+---------+-------+ +-------+-----------+-----------+------------+------------+ ABI/TBIToday's ABIToday's TBIPrevious ABIPrevious TBI +-------+-----------+-----------+------------+------------+ Right  1.19       0.94                                 +-------+-----------+-----------+------------+------------+ Left   1.02       0.90                                +-------+-----------+-----------+------------+------------+   Summary: Right: Resting right ankle-brachial index is within normal range. No evidence of significant right lower extremity arterial disease. The right toe-brachial index is normal. Left: Resting left ankle-brachial index is within normal range. No evidence of significant left lower extremity  arterial disease. The left toe-brachial index is normal.  *See table(s) above for measurements and observations.  Electronically signed by Jamelle Haring on 11/29/2020 at 2:36:07 PM.    Final    ECHOCARDIOGRAM COMPLETE  Result Date: 11/30/2020    ECHOCARDIOGRAM REPORT   Patient Name:   [name] Date of Exam: 11/30/2020 Medical Rec #:  [id number]           Height:       61.0 in Accession #:    [id number]          Weight:       147.0 lb Date of Birth:  February 28, 1951          BSA:          1.657 m Patient Age:    44 years            BP:           133/69 mmHg Patient Gender: F                   HR:           78 bpm. Exam Location:  Inpatient Procedure: 2D Echo Indications:    Syncope R55; Chest Pain  History:        Patient has prior history of Echocardiogram examinations, most                 recent 04/07/2019. Risk Factors:Diabetes, Hypertension and                 Dyslipidemia.  Sonographer:    Mikki Santee RDCS (AE) Referring Phys: Bernice  1. Elevated mid-cavitary gradient due to hyperdynamic LV function. Left ventricular ejection fraction, by estimation, is 70 to 75%. The left ventricle has hyperdynamic function. The left ventricle has no regional wall motion abnormalities. Left ventricular diastolic parameters are consistent with Grade I diastolic dysfunction (impaired relaxation).  2. Right ventricular systolic function is hyperdynamic. The right ventricular size is normal.  3. The mitral valve is  abnormal. Trivial mitral valve regurgitation.  4. The aortic valve is tricuspid. Aortic valve regurgitation is not visualized. Mild aortic valve sclerosis is present, with no evidence of aortic valve stenosis. Aortic valve mean gradient measures 11.0 mmHg, but this is primarily subvalvular (intracavitary) -no dynamic LVOT obstruction is suspected. The aortic valve appears to open freely.  5. The inferior vena cava is normal in size with greater than 50% respiratory variability, suggesting right atrial pressure of 3 mmHg. Comparison(s): Changes from prior study are noted. 04/07/2019: LVEF 60-65%, mild aortic stenosis - mean gradient 6 mmHg. FINDINGS  Left Ventricle: Elevated mid-cavitary gradient due to hyperdynamic LV function. Left ventricular ejection fraction, by estimation, is 70 to 75%. The left ventricle has hyperdynamic function. The left ventricle has no regional wall motion abnormalities. The left ventricular internal cavity size was normal in size. There is no left ventricular hypertrophy. Left ventricular diastolic parameters are consistent with Grade I diastolic dysfunction (impaired relaxation). Indeterminate filling pressures. Right Ventricle: The right ventricular size is normal. No increase in right ventricular wall thickness. Right ventricular systolic function is hyperdynamic. Left Atrium: Left atrial size was normal in size. Right Atrium: Right atrial size was normal in size. Pericardium: There is no evidence of pericardial effusion. Mitral Valve: The mitral valve is abnormal. There is mild thickening of the mitral valve leaflet(s). Trivial mitral valve regurgitation. Tricuspid Valve: The tricuspid valve is grossly normal. Tricuspid valve regurgitation is trivial.  Aortic Valve: The aortic valve is tricuspid. Aortic valve regurgitation is not visualized. Mild aortic valve sclerosis is present, with no evidence of aortic valve stenosis. Aortic valve mean gradient measures 11.0 mmHg. Pulmonic Valve:  The pulmonic valve was grossly normal. Pulmonic valve regurgitation is not visualized. Aorta: The aortic root and ascending aorta are structurally normal, with no evidence of dilitation. Venous: The inferior vena cava is normal in size with greater than 50% respiratory variability, suggesting right atrial pressure of 3 mmHg. IAS/Shunts: No atrial level shunt detected by color flow Doppler.  LEFT VENTRICLE PLAX 2D LVIDd:         2.70 cm  Diastology LVIDs:         1.60 cm  LV e' medial:    6.64 cm/s LV PW:         0.80 cm  LV E/e' medial:  11.7 LV IVS:        0.90 cm  LV e' lateral:   7.07 cm/s LVOT diam:     2.00 cm  LV E/e' lateral: 11.0 LV SV:         67 LV SV Index:   41 LVOT Area:     3.14 cm  RIGHT VENTRICLE RV S prime:     14.20 cm/s TAPSE (M-mode): 1.7 cm LEFT ATRIUM             Index       RIGHT ATRIUM          Index LA diam:        1.90 cm 1.15 cm/m  RA Area:     9.71 cm LA Vol (A2C):   20.8 ml 12.55 ml/m RA Volume:   15.60 ml 9.41 ml/m LA Vol (A4C):   17.0 ml 10.26 ml/m LA Biplane Vol: 19.3 ml 11.65 ml/m  AORTIC VALVE AV Mean Grad: 11.0 mmHg LVOT Vmax:    109.00 cm/s LVOT Vmean:   64.800 cm/s LVOT VTI:     0.214 m  AORTA Ao Root diam: 2.30 cm Ao Asc diam:  2.70 cm MITRAL VALVE MV Area (PHT): 2.37 cm     SHUNTS MV Decel Time: 320 msec     Systemic VTI:  0.21 m MV E velocity: 77.80 cm/s   Systemic Diam: 2.00 cm MV A velocity: 106.00 cm/s MV E/A ratio:  0.73 Lyman Bishop MD Electronically signed by Lyman Bishop MD Signature Date/Time: 11/30/2020/10:38:58 AM    Final      Subjective: She denies dyspnea, chest pain.   Discharge Exam: Vitals:   12/01/20 1015 12/01/20 1101  BP: 139/75 122/80  Pulse: 100 84  Resp: 20 18  Temp:  98.3 F (36.8 C)  SpO2: 99% 100%     General: Pt is alert, awake, not in acute distress Cardiovascular: RRR, S1/S2 +, no rubs, no gallops Respiratory: CTA bilaterally, no wheezing, no rhonchi Abdominal: Soft, NT, ND, bowel sounds + Extremities: no edema, no  cyanosis    The results of significant diagnostics from this hospitalization (including imaging, microbiology, ancillary and laboratory) are listed below for reference.     Microbiology: Recent Results (from the past 240 hour(s))  SARS CORONAVIRUS 2 (TAT 6-24 HRS) Nasopharyngeal Nasopharyngeal Swab     Status: None   Collection Time: 11/29/20  9:38 PM   Specimen: Nasopharyngeal Swab  Result Value Ref Range Status   SARS Coronavirus 2 NEGATIVE NEGATIVE Final    Comment: (NOTE) SARS-CoV-2 target nucleic acids are NOT DETECTED.  The SARS-CoV-2 RNA is generally detectable  in upper and lower respiratory specimens during the acute phase of infection. Negative results do not preclude SARS-CoV-2 infection, do not rule out co-infections with other pathogens, and should not be used as the sole basis for treatment or other patient management decisions. Negative results must be combined with clinical observations, patient history, and epidemiological information. The expected result is Negative.  Fact Sheet for Patients: SugarRoll.be  Fact Sheet for Healthcare Providers: https://www.woods-mathews.com/  This test is not yet approved or cleared by the [place] FDA and  has been authorized for detection and/or diagnosis of SARS-CoV-2 by FDA under an Emergency Use Authorization (EUA). This EUA will remain  in effect (meaning this test can be used) for the duration of the COVID-19 declaration under Se ction 564(b)(1) of the Act, 21 U.S.C. section 360bbb-3(b)(1), unless the authorization is terminated or revoked sooner.  Performed at [facility], [place] [address] [place], [place] [address]      Labs: BNP (last 3 results) No results for input(s): BNP in the last 8760 hours. Basic Metabolic Panel: Recent Labs  Lab 11/29/20 1638 11/30/20 0135 12/01/20 0859  NA 139 138 139  K 3.9 3.5 3.8  CL 102 103 104  CO2 30 27 29   GLUCOSE 253*  166* 170*  BUN 17 17 10   CREATININE 1.27* 1.17* 1.04*  CALCIUM 9.6 9.4 8.7*   Liver Function Tests: No results for input(s): AST, ALT, ALKPHOS, BILITOT, PROT, ALBUMIN in the last 168 hours. No results for input(s): LIPASE, AMYLASE in the last 168 hours. No results for input(s): AMMONIA in the last 168 hours. CBC: Recent Labs  Lab 11/29/20 1638 11/30/20 0135 12/01/20 0859  WBC 7.4 8.8 4.3  NEUTROABS 5.5  --   --   HGB 16.1* 16.0* 13.9  HCT 49.2* 49.4* 42.7  MCV 95.0 94.6 94.5  PLT 274 255 238   Cardiac Enzymes: No results for input(s): CKTOTAL, CKMB, CKMBINDEX, TROPONINI in the last 168 hours. BNP: Invalid input(s): POCBNP CBG: Recent Labs  Lab 11/30/20 1609 11/30/20 1650 11/30/20 2303 12/01/20 0639 12/01/20 1104  GLUCAP 316* 278* 162* 89 111*   D-Dimer Recent Labs    11/30/20 1003  DDIMER 0.40   Hgb A1c Recent Labs    11/29/20 2147  HGBA1C 10.4*   Lipid Profile No results for input(s): CHOL, HDL, LDLCALC, TRIG, CHOLHDL, LDLDIRECT in the last 72 hours. Thyroid function studies No results for input(s): TSH, T4TOTAL, T3FREE, THYROIDAB in the last 72 hours.  Invalid input(s): FREET3 Anemia work up No results for input(s): VITAMINB12, FOLATE, FERRITIN, TIBC, IRON, RETICCTPCT in the last 72 hours. Urinalysis    Component Value Date/Time   COLORURINE AMBER (A) 02/19/2020 1041   APPEARANCEUR CLOUDY (A) 02/19/2020 1041   LABSPEC 1.028 02/19/2020 1041   PHURINE 5.0 02/19/2020 1041   GLUCOSEU >=500 (A) 02/19/2020 1041   GLUCOSEU >=1000 03/16/2013 1259   HGBUR SMALL (A) 02/19/2020 1041   HGBUR trace-intact 11/17/2010 1230   BILIRUBINUR NEGATIVE 02/19/2020 1041   BILIRUBINUR negative 12/31/2019 1519   KETONESUR 20 (A) 02/19/2020 1041   PROTEINUR >=300 (A) 02/19/2020 1041   UROBILINOGEN 0.2 12/31/2019 1519   UROBILINOGEN 0.2 11/15/2013 0040   NITRITE NEGATIVE 02/19/2020 1041   LEUKOCYTESUR SMALL (A) 02/19/2020 1041   Sepsis Labs Invalid input(s):  PROCALCITONIN,  WBC,  LACTICIDVEN Microbiology Recent Results (from the past 240 hour(s))  SARS CORONAVIRUS 2 (TAT 6-24 HRS) Nasopharyngeal Nasopharyngeal Swab     Status: None   Collection Time: 11/29/20  9:38  PM   Specimen: Nasopharyngeal Swab  Result Value Ref Range Status   SARS Coronavirus 2 NEGATIVE NEGATIVE Final    Comment: (NOTE) SARS-CoV-2 target nucleic acids are NOT DETECTED.  The SARS-CoV-2 RNA is generally detectable in upper and lower respiratory specimens during the acute phase of infection. Negative results do not preclude SARS-CoV-2 infection, do not rule out co-infections with other pathogens, and should not be used as the sole basis for treatment or other patient management decisions. Negative results must be combined with clinical observations, patient history, and epidemiological information. The expected result is Negative.  Fact Sheet for Patients: SugarRoll.be  Fact Sheet for Healthcare Providers: https://www.woods-mathews.com/  This test is not yet approved or cleared by the [place] FDA and  has been authorized for detection and/or diagnosis of SARS-CoV-2 by FDA under an Emergency Use Authorization (EUA). This EUA will remain  in effect (meaning this test can be used) for the duration of the COVID-19 declaration under Se ction 564(b)(1) of the Act, 21 U.S.C. section 360bbb-3(b)(1), unless the authorization is terminated or revoked sooner.  Performed at [facility], [place] [address] [place], [place] [address]      Time coordinating discharge: 40 minutes  SIGNED:   Elmarie Shiley, MD  [facility]

## 2020-12-01 NOTE — Evaluation (Signed)
Physical Therapy Evaluation Patient Details Name: [name] MRN: [id number] DOB: 1951/03/21 Today's Date: 12/01/2020   History of Present Illness  pt is a 70 y/o female admitted 4/5 after a syncopal episode and associated chronic right side chest pain intermittently.  PMHx:  syncope, sui, UTI's, OA L knee, HTN, DM2, fibromyalgia Lumbar disc herniation  Clinical Impression  Pt admitted with/for syncope.   No definitive etiology found.  Pt needing minimal assist due to significant instability.  Pt advised to use the RW until feeling better..  Pt currently limited functionally due to the problems listed below.  (see problems list.)  Pt will benefit from PT to maximize function and safety to be able to get home safely with available assist.     Follow Up Recommendations SNF;Supervision/Assistance - 24 hour    Equipment Recommendations  Other (comment) (TBA)    Recommendations for Other Services       Precautions / Restrictions Precautions Precautions: Fall      Mobility  Bed Mobility Overal bed mobility: Needs Assistance Bed Mobility: Supine to Sit;Sit to Supine     Supine to sit: Supervision Sit to supine: Supervision   General bed mobility comments: no assist needed, slow since feeling dizzy.    Transfers Overall transfer level: Needs assistance   Transfers: Sit to/from Stand Sit to Stand: Min guard;Min assist         General transfer comment: no standing assist, pt using bed frame for stability.  light stability assist once standing.  Ambulation/Gait Ambulation/Gait assistance: Min assist Gait Distance (Feet): 160 Feet Assistive device: None Gait Pattern/deviations: Step-through pattern;Decreased stride length   Gait velocity interpretation: <1.8 ft/sec, indicate of risk for recurrent falls General Gait Details: generally unsteady with drifting, mild staggering reaching out to stationary objects  Stairs            Wheelchair Mobility     Modified Rankin (Stroke Patients Only)       Balance Overall balance assessment: Needs assistance Sitting-balance support: No upper extremity supported;Feet supported Sitting balance-Leahy Scale: Fair     Standing balance support: Single extremity supported;No upper extremity supported;During functional activity Standing balance-Leahy Scale: Poor Standing balance comment: unsteady stance with wobbles, reaching out for stability                             Pertinent Vitals/Pain Pain Assessment: Faces Faces Pain Scale: No hurt Pain Intervention(s): Monitored during session    Home Living Family/patient expects to be discharged to:: Private residence Living Arrangements: Non-relatives/Friends Available Help at Discharge: Available PRN/intermittently;Other (Comment) (roomate in school from 9am to 5pm.) Type of Home: House Home Access: Stairs to enter Entrance Stairs-Rails: Psychiatric nurse of Steps: 15 Home Layout: Two level Home Equipment: Environmental consultant - 2 wheels;Shower seat      Prior Function Level of Independence: Independent with assistive device(s)               Hand Dominance        Extremity/Trunk Assessment   Upper Extremity Assessment Upper Extremity Assessment: Overall WFL for tasks assessed    Lower Extremity Assessment Lower Extremity Assessment: Overall WFL for tasks assessed       Communication   Communication: No difficulties  Cognition Arousal/Alertness: Awake/alert Behavior During Therapy: Flat affect;Anxious Overall Cognitive Status: Within Functional Limits for tasks assessed (NT formally)  General Comments      Exercises     Assessment/Plan    PT Assessment Patient needs continued PT services  PT Problem List Decreased balance;Decreased mobility;Decreased coordination;Decreased activity tolerance;Decreased safety awareness       PT Treatment  Interventions DME instruction;Gait training;Functional mobility training;Therapeutic activities;Balance training;Patient/family education    PT Goals (Current goals can be found in the Care Plan section)  Acute Rehab PT Goals Patient Stated Goal: to go straight home today PT Goal Formulation: With patient Time For Goal Achievement: 12/15/20 Potential to Achieve Goals: Good    Frequency Min 3X/week   Barriers to discharge Decreased caregiver support      Co-evaluation               AM-PAC PT "6 Clicks" Mobility  Outcome Measure Help needed turning from your back to your side while in a flat bed without using bedrails?: None Help needed moving from lying on your back to sitting on the side of a flat bed without using bedrails?: None Help needed moving to and from a bed to a chair (including a wheelchair)?: A Little Help needed standing up from a chair using your arms (e.g., wheelchair or bedside chair)?: A Little Help needed to walk in hospital room?: A Little Help needed climbing 3-5 steps with a railing? : A Little 6 Click Score: 20    End of Session   Activity Tolerance: Patient tolerated treatment well;Patient limited by fatigue Patient left: in bed;with call bell/phone within reach Nurse Communication: Mobility status PT Visit Diagnosis: Other abnormalities of gait and mobility (R26.89);Other symptoms and signs involving the nervous system (R29.898)    Time: 9244-6286 PT Time Calculation (min) (ACUTE ONLY): 32 min   Charges:   PT Evaluation $PT Eval Moderate Complexity: 1 Mod PT Treatments $Gait Training: 8-22 mins        12/01/2020  Ginger Carne., PT Acute Rehabilitation Services [phone]  (pager) [phone]  (office)  [name] 12/01/2020, 12:24 PM

## 2020-12-01 NOTE — Discharge Instructions (Signed)
Your Blood pressure medication, Norvasc dose was reduce to 5 mg to avoid low [place] pressure.   Hold spironolactone until follow up with PCP.   Keep your self hydrated.  Humalog insulin on hold, you have not required significant amount of short acting insulin in the hospital.

## 2020-12-02 ENCOUNTER — Ambulatory Visit: Payer: Medicare Other | Admitting: Orthopaedic Surgery

## 2020-12-06 ENCOUNTER — Telehealth: Payer: Self-pay

## 2020-12-06 NOTE — Telephone Encounter (Signed)
Transition Care Management Follow-up Telephone Call  Date of discharge and from where: 12/01/20 [place]  How have you been since you were released from the hospital? JUST TIRED  Any questions or concerns? No   Items Reviewed:  Did the pt receive and understand the discharge instructions provided? Yes   Medications obtained and verified? Yes   Any new allergies since your discharge? No   Dietary orders reviewed? Yes  Do you have support at home? Yes   Other (ie: DME, Home Health, etc) NONE  Functional Questionnaire: (I = Independent and D = Dependent)  Bathing/Dressing- I   Meal Prep- I  Eating- I  Maintaining continence- I  Transferring/Ambulation- I  Managing Meds- I   Follow up appointments reviewed:    PCP Hospital f/u appt confirmed? No  PATIENT REFUSED SAYS SHE WILL COME TO MAY 12TH APPT [facility] f/u appt confirmed? No    Are transportation arrangements needed? No   If their condition worsens, is the pt aware to call  their PCP or go to the ED? Yes  Was the patient provided with contact information for the PCP's office or ED? Yes  Was the pt encouraged to call back with questions or concerns? Yes

## 2020-12-06 NOTE — Chronic Care Management (AMB) (Signed)
Chronic Care Management Pharmacy Assistant   Name: LAELYN BLUMENTHAL  MRN: [id number] DOB: 10/12/1950  Reason for Encounter: Medication Review/ Medication Coordination Call   Recent office visits:  11/16/2020- [name], RN (CCM)  Recent consult visits:  11/04/2020- Dr. Lorin Mercy (Orthopedics) 11/29/2020- Dr. Stanford Breed ([facility] [place])  Hospital visits:  Medication Reconciliation was completed by comparing discharge summary, patient's EMR and Pharmacy list, and upon discussion with patient.  Admitted to the hospital on 11/29/2020 due to Syncope. Discharge date was 12/01/2020. Discharged from [place]?Medications Started at [facility] Discharge:?? -started- None  Medication Changes at Hospital Discharge: -Changed Amlodipine 5 mg- 1 tablet at bedtime (strength decreased), Farxiga 10 mg- 1 tablet at before breakfast(dosage decreased), Pravastatin 40 mg- 1 tablet at bedtime (dosage increased), Lyrica 50 mg- 1 tablet three times daily(dosage increased), Tresiba 100 u/ml- Inject 15 units sq daily(dosage decreased), Vitamin D 50,000 units- Take 1 capsule once weekly on Wednesdays(directions changed)  Medications Discontinued at Hospital Discharge: -Stopped Humalog 100 u/ml and Spironolactone 50 mg due to Dehydration and Syncope  Medications that remain the same after Hospital Discharge:??  -All other medications will remain the same.    Reviewed chart for medication changes ahead of medication coordination call.  Medication changes indicated: 11/24/2020- Farxiga 10 mg directions changed from twice daily to one tablet daily before breakfast.  BP Readings from Last 3 Encounters:  12/01/20 133/74  11/29/20 (!) 149/87  11/04/20 (!) 172/84    Lab Results  Component Value Date   HGBA1C 10.4 (H) 11/29/2020     Patient obtains medications through Adherence Packaging  90 Days   Last adherence delivery included:   Women's 50 Plus Multivitamin- 1 tablet  daily(breakfast)  Myrbetriq 50 mg- 1 tablet daily(breakfast)  Spironolactone 50 mg- 1 tablet daily(breakfast)  Amlodipine 10 mg- one tablet daily (bedtime)  Patient declined the following medications last month:  Pravastatin 40 mg- 1/2 tablet daily(bedtime)  Ergocalciferol 50,000 units- 1 capsule weekly on Wednesdays                                                 Due to receiving a 90-day supply on 10/25/2020  Ozempic 1 mg- Inject 1mg  into skin once weekly  Farxiga 10 gm- 1 tablet daily (before breakfast)                                     Due to receiving a 90-day supply on 09/05/2020  Amitiza 8 mg- 1 capsule dailydue to being discontinued, no insurance coverage, replaced with Linzess.   Tresiba FlexTouch- Inject 18 units into skin daily due to receiving a 50-day supply on 08/16/2020. Patient states she will use 18-20 units daily depending on her dinner readings, she has 2 pens left.  Gabapentin 300 mg- 1 capsule twice daily due to discontinuing medication. Patient states she doesn't feel like medication was helping so she stopped taking.   Novofine 32G x 51mm needles due to having an adequate supply.   Methocarbamol 750 mg- due to being discontinued.  Pravastatin 80 mg due to medication being reduced to 40 mg.  Humalog Kwikpen- Inject 25 units sq at breakfast and 35 units sq at lunch due to receiving a 50 day supply on 10/12/2020.  Patient is due for next adherence delivery on: 12/07/2020 .   12/06/2020- Called patient 2x's to review medications and coordinate delivery, no answer, left message to return call.  12/08/2020- Called patient to review medications, no answer, left message to return call.   Will retry patient next month to review medication and coordinate. [name], CPP noted.  Medications: Outpatient Encounter Medications as of 12/06/2020  Medication Sig  . amLODipine (NORVASC) 5 MG tablet Take 1 tablet (5 mg total) by mouth at bedtime.  [name] aspirin EC  81 MG EC tablet Take 1 tablet (81 mg total) by mouth daily. Swallow whole.  . Continuous Blood Gluc Receiver (FREESTYLE LIBRE READER) DEVI 1 each by Does not apply route See admin instructions. CGM device-Freestyle Libre  . Continuous Blood Gluc Sensor (FREESTYLE LIBRE 14 DAY SENSOR) MISC 1 each by Does not apply route See admin instructions. CGM 14-day sensors; send refills to [facility] (in [place])  . docusate sodium (COLACE) 100 MG capsule Take 100 mg by mouth daily as needed for mild constipation.   [name] FARXIGA 10 MG TABS tablet TAKE ONE TABLET BY MOUTH BEFORE BREAKFAST (Patient taking differently: Take 10 mg by mouth daily.)  . insulin degludec (TRESIBA FLEXTOUCH) 100 UNIT/ML FlexTouch Pen Inject 15 Units into the skin daily.  . Insulin Pen Needle (BD PEN NEEDLE MICRO U/F) 32G X 6 MM MISC DX:E11.65 USE TO CHECK BLOOD SUGARS THREE TIMES A DAY  . Lancets Misc. MISC 1 each by Does not apply route 4 (four) times daily -  with meals and at bedtime.  [name] linaclotide (LINZESS) 145 MCG CAPS capsule Take 1 capsule (145 mcg total) by mouth daily before breakfast.  . mirabegron ER (MYRBETRIQ) 50 MG TB24 tablet Take 1 tablet (50 mg total) by mouth daily.  . Multiple Vitamin (MULTIVITAMIN WITH MINERALS) TABS tablet Take 1 tablet by mouth daily.  . pantoprazole (PROTONIX) 40 MG tablet Take 1 tablet (40 mg total) by mouth 2 (two) times daily.  . pravastatin (PRAVACHOL) 40 MG tablet TAKE ONE TABLET BY MOUTH EVERYDAY AT BEDTIME (Patient taking differently: Take 40 mg by mouth daily.)  . pregabalin (LYRICA) 50 MG capsule Take 1 capsule (50 mg total) by mouth 3 (three) times daily. (Patient taking differently: Take 50 mg by mouth daily.)  . Vitamin D, Ergocalciferol, (DRISDOL) 1.25 MG (50000 UNIT) CAPS capsule TAKE ONE CAPSULE BY MOUTH ONCE WEEKLY ON WEDNESDAY (Patient taking differently: Take 50,000 Units by mouth every 7 (seven) days.)   No facility-administered encounter medications on file as of 12/06/2020.      Star Rating Drugs: Farxiga 10 mg- Last filled 09/05/2020 for 90 day supply at [facility]. Pravastatin 40 mg- Last filled 10/25/2020 for 90 days supply at [facility]. Ozempic 1 mg- Last filled 09/05/2020 for 90 day supply at [facility].   SIG: [name], [place] [phone]

## 2020-12-13 ENCOUNTER — Other Ambulatory Visit: Payer: Self-pay

## 2020-12-13 ENCOUNTER — Ambulatory Visit (INDEPENDENT_AMBULATORY_CARE_PROVIDER_SITE_OTHER): Payer: Medicare Other | Admitting: Nurse Practitioner

## 2020-12-13 ENCOUNTER — Encounter: Payer: Self-pay | Admitting: Nurse Practitioner

## 2020-12-13 VITALS — BP 124/78 | HR 87 | Temp 98.3°F | Ht 64.0 in | Wt 151.2 lb

## 2020-12-13 DIAGNOSIS — E1165 Type 2 diabetes mellitus with hyperglycemia: Secondary | ICD-10-CM | POA: Diagnosis not present

## 2020-12-13 DIAGNOSIS — Z09 Encounter for follow-up examination after completed treatment for conditions other than malignant neoplasm: Secondary | ICD-10-CM | POA: Diagnosis not present

## 2020-12-13 DIAGNOSIS — I10 Essential (primary) hypertension: Secondary | ICD-10-CM | POA: Diagnosis not present

## 2020-12-13 LAB — POCT UA - MICROALBUMIN
Creatinine, POC: 100 mg/dL
Microalbumin Ur, POC: 80 mg/L

## 2020-12-13 NOTE — Progress Notes (Addendum)
[employer] as a Education administrator for [employer], NP.,have documented all relevant documentation on the behalf of [employer], NP,as directed by  [name], NP while in the presence of [name], NP. This visit occurred during the SARS-CoV-2 public health emergency.  Safety protocols were in place, including screening questions prior to the visit, additional usage of staff PPE, and extensive cleaning of exam room while observing appropriate contact time as indicated for disinfecting solutions.  Subjective:     Patient ID: [name] , female    DOB: 1950/11/01 , 70 y.o.   MRN: [id number]   Chief Complaint  Patient presents with  . Hypertension    HPI  Patient here for a blood pressure and hypertension follow up. Also here to check her BMP & CBC from recent hospital stay. Went to [facility] on 11/30/19 did not get released until 01/01/20. While at the hospital the doctors advised her to stop taking Humalog & spironolactone, so blood sugars have been increased since. The patient is using freestyle libra and uses it to check her BS atleast 6 times a day before and after each meal. And also uses it to check if she feels "funny."  Currently she is taking [place] 15 units in the morning, Farsiga 10 mg daily in the morning Her BS currently run before breakfast 189;200   Before lunch- 200s-300s. When she was using the Humalog prior to the hospital visit, she was using 25 units before breakfast, 35 units before lunch. 25 units before dinner  Not clear if her syncope episode may have been due to dehydration, low BS or low BP. The patient was instructed to hold off sprinolactone and Humalog at discharge from the hospital. She states that sometimes she does use the sliding scale if her sugars run really high.    She is going to take 10 units of humolog before each meal and keep a log of BS for May   Hypertension This is a chronic problem. The current episode  started 1 to 4 weeks ago (1-2 weeks). The problem is unchanged. The problem is uncontrolled. Pertinent negatives include no anxiety, blurred vision, chest pain, headaches, palpitations or shortness of breath. There are no associated agents to hypertension. Risk factors for coronary artery disease include diabetes mellitus and sedentary lifestyle. There are no compliance problems.  There is no history of angina. There is no history of chronic renal disease.  Diabetes She presents for her follow-up diabetic visit. She has type 2 diabetes mellitus. There are no hypoglycemic associated symptoms. Pertinent negatives for hypoglycemia include no headaches. Pertinent negatives for diabetes include no blurred vision, no chest pain, no fatigue, no polydipsia, no polyphagia, no polyuria and no weakness. There are no hypoglycemic complications. There are no diabetic complications. Risk factors for coronary artery disease include sedentary lifestyle. Current diabetic treatment includes oral agent (triple therapy). She is compliant with treatment all of the time. She is following a generally unhealthy diet. When asked about meal planning, she reported none. She has not had a previous visit with a dietitian. She rarely participates in exercise. There is no change in her home blood glucose trend. (She is using the freestyle libre) She does not see a podiatrist.Eye exam is current.  Hyperlipidemia This is a chronic problem. The current episode started more than 1 year ago. The problem is controlled. Exacerbating diseases include diabetes. She has no history of chronic renal disease or obesity. Pertinent negatives include no chest pain or shortness  of breath. The current treatment provides moderate improvement of lipids. There are no compliance problems.  Risk factors for coronary artery disease include diabetes mellitus, dyslipidemia and post-menopausal.     Past Medical History:  Diagnosis Date  . Aortic stenosis    mild  on 04/07/19 echo  . Arthritis    L knee, hands, back   . Asthma   . Diabetes mellitus type 2, insulin dependent ([place])   . Fibromyalgia   . Full dentures   . Full dentures   . GERD (gastroesophageal reflux disease)   . H/O hiatal hernia   . Headache    h/o migraines - was followed for a time with a wellness doctor  . History of esophageal dilatation   . History of rhabdomyolysis    03/ 2015  . Hyperlipidemia   . Hypertension   . Insulin pump in place    since 12/ 2014--  [facility]  . Lumbar disc herniation    right leg weakness  . Osteoarthritis of left knee, primary localized 08/17/2014  . 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  . Renal insufficiency   . Rhabdomyolysis 11/15/2013  . SUI (stress urinary incontinence, female)   . Syncope 12/27/2017  . Wears glasses   . Wears glasses      Family History  Problem Relation Age of Onset  . Diabetes Mother   . Hypertension Mother   . Lung cancer Mother        smoked  . Diabetes Father   . Hypertension Father   . Lung cancer Father        smoked  . Diabetes Brother   . Heart failure Sister   . Diabetes Maternal Grandmother   . Cancer Maternal Grandmother      Current Outpatient Medications:  .  amLODipine (NORVASC) 5 MG tablet, Take 1 tablet (5 mg total) by mouth at bedtime., Disp: 30 tablet, Rfl: 3 .  aspirin EC 81 MG EC tablet, Take 1 tablet (81 mg total) by mouth daily. Swallow whole., Disp: 30 tablet, Rfl: 11 .  Continuous Blood Gluc Receiver (FREESTYLE LIBRE READER) DEVI, 1 each by Does not apply route See admin instructions. CGM device-Freestyle Libre, Disp: , Rfl:  .  Continuous Blood Gluc Sensor (FREESTYLE LIBRE 14 DAY SENSOR) MISC, 1 each by Does not apply route See admin instructions. CGM 14-day sensors; send refills to [facility] (in [place]), Disp: , Rfl:  .  FARXIGA 10 MG TABS tablet, TAKE ONE TABLET BY MOUTH BEFORE BREAKFAST (Patient taking differently: Take 10 mg by mouth daily.), Disp: 90 tablet, Rfl: 1 .  insulin  degludec (TRESIBA FLEXTOUCH) 100 UNIT/ML FlexTouch Pen, Inject 15 Units into the skin daily., Disp: 9 mL, Rfl: 1 .  Insulin Pen Needle (BD PEN NEEDLE MICRO U/F) 32G X 6 MM MISC, DX:E11.65 USE TO CHECK BLOOD SUGARS THREE TIMES A DAY, Disp: 100 each, Rfl: 6 .  Lancets Misc. MISC, 1 each by Does not apply route 4 (four) times daily -  with meals and at bedtime., Disp: 300 each, Rfl: 3 .  mirabegron ER (MYRBETRIQ) 50 MG TB24 tablet, Take 1 tablet (50 mg total) by mouth daily., Disp: 90 tablet, Rfl: 0 .  Multiple Vitamin (MULTIVITAMIN WITH MINERALS) TABS tablet, Take 1 tablet by mouth daily., Disp: , Rfl:  .  pravastatin (PRAVACHOL) 40 MG tablet, TAKE ONE TABLET BY MOUTH EVERYDAY AT BEDTIME (Patient taking differently: Take 40 mg by mouth daily.), Disp: 90 tablet, Rfl: 2 .  pregabalin (LYRICA) 50 MG capsule,  Take 1 capsule (50 mg total) by mouth 3 (three) times daily. (Patient taking differently: Take 50 mg by mouth daily.), Disp: 90 capsule, Rfl: 2 .  Vitamin D, Ergocalciferol, (DRISDOL) 1.25 MG (50000 UNIT) CAPS capsule, TAKE ONE CAPSULE BY MOUTH ONCE WEEKLY ON WEDNESDAY (Patient taking differently: Take 50,000 Units by mouth every 7 (seven) days.), Disp: 13 capsule, Rfl: 1 .  linaclotide (LINZESS) 145 MCG CAPS capsule, Take 1 capsule (145 mcg total) by mouth daily before breakfast., Disp: 30 capsule, Rfl: 3 .  pantoprazole (PROTONIX) 40 MG tablet, Take 1 tablet (40 mg total) by mouth 2 (two) times daily. (Patient not taking: Reported on 12/13/2020), Disp: 60 tablet, Rfl: 1   Allergies  Allergen Reactions  . Pollen Extract Other (See Comments)    Congestion/sneezing  . Tomato Hives and Itching  . Codeine Hives, Itching and Nausea And Vomiting     Review of Systems  Constitutional: Negative.  Negative for chills and fatigue.  HENT: Negative.   Eyes: Negative.  Negative for blurred vision.  Respiratory: Negative.  Negative for cough, choking, shortness of breath and wheezing.   Cardiovascular:  Negative.  Negative for chest pain and palpitations.  Gastrointestinal: Negative.   Endocrine: Negative.  Negative for polydipsia, polyphagia and polyuria.  Genitourinary: Negative.   Musculoskeletal: Negative.   Skin: Negative.   Allergic/Immunologic: Negative.   Neurological: Negative.  Negative for weakness, numbness and headaches.  Hematological: Negative.   Psychiatric/Behavioral: Negative.      Today's Vitals   12/13/20 1433  BP: 124/78  Pulse: 87  Temp: 98.3 F (36.8 C)  Weight: 151 lb 3.2 oz (68.6 kg)  Height: 5' 4" (1.626 m)   Body mass index is 25.95 kg/m.   Objective:  Physical Exam Constitutional:      Appearance: Normal appearance.  Cardiovascular:     Rate and Rhythm: Normal rate and regular rhythm.     Pulses: Normal pulses.     Heart sounds: Normal heart sounds. No murmur heard.   Pulmonary:     Effort: Pulmonary effort is normal. No respiratory distress.     Breath sounds: Normal breath sounds. No wheezing.  Skin:    Capillary Refill: Capillary refill takes less than 2 seconds.  Neurological:     Mental Status: She is alert.         Assessment And Plan:     1. Hospital discharge follow-up -Patient here for a follow up from a recent visit from the hospital for a syncope episode she had. She received IV fluids. She has had some episodes in the past. Her orthostatic vitals were negative in the hospital. At discharge sprinolactone and Humalog was held due to her BS being controlled in the hospital.   2. Uncontrolled type 2 diabetes mellitus with hyperglycemia (HCC) -Chronic, Poorly controlled  - Continue farxiga 10 mg, tresiba 15 units.  -She checks BS with libra at least 6 times or more daily. Before and after each meal due to having frequent fluctuation in blood sugar levels.   -She will start 10 Units of Humalog with each meal. And keep a log of her BS and being it with her at her visit in May (next month) and we will reassess. -Consider referral  to endocrinologist or diabetic educator in the future  - CBC no Diff - BMP8+eGFR - POCT UA - Microalbumin  3. Primary hypertension -Chronic, good control  -Continue with amlodipine. Held Spironolactone at this time.  -Educated patient to avoid salt intake, processed  food.  -Educated patent  - CBC no Diff - BMP8+eGFR    Patient was given opportunity to ask questions. Patient verbalized understanding of the plan and was able to repeat key elements of the plan. All questions were answered to their satisfaction.  [name], DNP   I, [name], DNP  have reviewed all documentation for this visit. The documentation on 12/13/20 for the exam, diagnosis, procedures, and orders are all accurate and complete.   IF YOU HAVE BEEN REFERRED TO A SPECIALIST, IT MAY TAKE 1-2 WEEKS TO SCHEDULE/PROCESS THE REFERRAL. IF YOU HAVE NOT HEARD FROM US/SPECIALIST IN TWO WEEKS, PLEASE GIVE [place] A CALL AT [phone] X [phone].   THE PATIENT IS ENCOURAGED TO PRACTICE SOCIAL DISTANCING DUE TO THE COVID-19 PANDEMIC.

## 2020-12-13 NOTE — Patient Instructions (Signed)

## 2020-12-14 LAB — BMP8+EGFR
BUN/Creatinine Ratio: 7 — ABNORMAL LOW (ref 12–28)
BUN: 8 mg/dL (ref 8–27)
CO2: 24 mmol/L (ref 20–29)
Calcium: 9.8 mg/dL (ref 8.7–10.3)
Chloride: 100 mmol/L (ref 96–106)
Creatinine, Ser: 1.22 mg/dL — ABNORMAL HIGH (ref 0.57–1.00)
Glucose: 224 mg/dL — ABNORMAL HIGH (ref 65–99)
Potassium: 4.2 mmol/L (ref 3.5–5.2)
Sodium: 143 mmol/L (ref 134–144)
eGFR: 48 mL/min/{1.73_m2} — ABNORMAL LOW (ref >59)

## 2020-12-14 LAB — CBC
Hematocrit: 46.5 % (ref 34.0–46.6)
Hemoglobin: 15.1 g/dL (ref 11.1–15.9)
MCH: 29.9 pg (ref 26.6–33.0)
MCHC: 32.5 g/dL (ref 31.5–35.7)
MCV: 92 fL (ref 79–97)
Platelets: 289 10*3/uL (ref 150–450)
RBC: 5.05 x10E6/uL (ref 3.77–5.28)
RDW: 12.5 % (ref 11.7–15.4)
WBC: 5.7 10*3/uL (ref 3.4–10.8)

## 2020-12-22 ENCOUNTER — Telehealth: Payer: Medicare Other

## 2020-12-22 ENCOUNTER — Ambulatory Visit (INDEPENDENT_AMBULATORY_CARE_PROVIDER_SITE_OTHER): Payer: Medicare Other

## 2020-12-22 DIAGNOSIS — E1165 Type 2 diabetes mellitus with hyperglycemia: Secondary | ICD-10-CM

## 2020-12-22 DIAGNOSIS — Z9889 Other specified postprocedural states: Secondary | ICD-10-CM

## 2020-12-22 DIAGNOSIS — E785 Hyperlipidemia, unspecified: Secondary | ICD-10-CM | POA: Diagnosis not present

## 2020-12-22 DIAGNOSIS — I10 Essential (primary) hypertension: Secondary | ICD-10-CM

## 2020-12-27 ENCOUNTER — Ambulatory Visit (INDEPENDENT_AMBULATORY_CARE_PROVIDER_SITE_OTHER): Payer: Medicare Other

## 2020-12-27 ENCOUNTER — Telehealth: Payer: Medicare Other

## 2020-12-27 DIAGNOSIS — I10 Essential (primary) hypertension: Secondary | ICD-10-CM

## 2020-12-27 DIAGNOSIS — E785 Hyperlipidemia, unspecified: Secondary | ICD-10-CM

## 2020-12-27 DIAGNOSIS — Z9889 Other specified postprocedural states: Secondary | ICD-10-CM

## 2020-12-27 DIAGNOSIS — E1165 Type 2 diabetes mellitus with hyperglycemia: Secondary | ICD-10-CM

## 2020-12-29 ENCOUNTER — Other Ambulatory Visit: Payer: Self-pay

## 2020-12-29 ENCOUNTER — Telehealth: Payer: Self-pay

## 2020-12-29 ENCOUNTER — Ambulatory Visit: Payer: Self-pay

## 2020-12-29 ENCOUNTER — Telehealth: Payer: Medicare Other

## 2020-12-29 DIAGNOSIS — I10 Essential (primary) hypertension: Secondary | ICD-10-CM | POA: Diagnosis not present

## 2020-12-29 DIAGNOSIS — E1165 Type 2 diabetes mellitus with hyperglycemia: Secondary | ICD-10-CM | POA: Diagnosis not present

## 2020-12-29 DIAGNOSIS — Z9889 Other specified postprocedural states: Secondary | ICD-10-CM

## 2020-12-29 DIAGNOSIS — E785 Hyperlipidemia, unspecified: Secondary | ICD-10-CM

## 2020-12-29 MED ORDER — LINACLOTIDE 145 MCG PO CAPS: 145.0000 ug | ORAL_CAPSULE | Freq: Every day | ORAL | 1 refills | Status: DC

## 2020-12-29 MED ORDER — VITAMIN D (ERGOCALCIFEROL) 1.25 MG (50000 UNIT) PO CAPS: ORAL_CAPSULE | ORAL | 1 refills | Status: DC

## 2020-12-29 MED ORDER — PANTOPRAZOLE SODIUM 40 MG PO TBEC: 40.0000 mg | DELAYED_RELEASE_TABLET | Freq: Every day | ORAL | 1 refills | Status: DC

## 2020-12-29 MED ORDER — BLOOD GLUCOSE MONITOR KIT: PACK | 1 refills | Status: DC

## 2020-12-29 NOTE — Patient Instructions (Addendum)
Goals Addressed    . Monitor and Manage My Blood Sugar-Diabetes Type 2   Not on track    Timeframe:  Long-Range Goal Priority:  High Start Date: 08/02/20                            Expected End Date: 01/31/21                      Follow Up Date: 01/09/21   - check blood sugar at prescribed times - check blood sugar before and after exercise - check blood sugar if I feel it is too high or too low - enter blood sugar readings and medication or insulin into daily log - take the blood sugar log to all doctor visits - take the blood sugar meter to all doctor visits - pick up glucometer from pharmacy and resume monitoring cbg's promptly - follow hypoglycemic protocol as directed, if needed   - continue to adhere to Diabetic diet as directed    Why is this important?    Checking your blood sugar at home helps to keep it from getting very high or very low.   Writing the results in a diary or log helps the doctor know how to care for you.   Your blood sugar log should have the time, date and the results.   Also, write down the amount of insulin or other medicine that you take.   Other information, like what you ate, exercise done and how you were feeling, will also be helpful.     Notes:

## 2020-12-29 NOTE — Telephone Encounter (Signed)
  Chronic Care Management   Outreach Note  12/29/2020 Name: [name] MRN: [id number] DOB: 1951/07/16  Referred by: [name], FNP Reason for referral : Chronic Care Management (RN CM FU Call Attempt)   An unsuccessful telephone outreach was attempted today. The patient was referred to the case management team for assistance with care management and care coordination.   Follow Up Plan: A HIPAA compliant phone message was left for the patient providing contact information and requesting a return call. Telephone follow up appointment with care management team member scheduled for:  01/09/21  [name], RN, BSN, CCM Care Management Coordinator [place] [facility]  Direct Phone: [phone]

## 2020-12-29 NOTE — Patient Instructions (Signed)
Goals Addressed    . Monitor and Manage My Blood Sugar-Diabetes Type 2   Not on track    Timeframe:  Long-Range Goal Priority:  High Start Date: 08/02/20                            Expected End Date: 01/31/21                      Follow Up Date: 01/09/21   - check blood sugar at prescribed times - check blood sugar before and after exercise - check blood sugar if I feel it is too high or too low - enter blood sugar readings and medication or insulin into daily log - take the blood sugar log to all doctor visits - take the blood sugar meter to all doctor visits    Why is this important?    Checking your blood sugar at home helps to keep it from getting very high or very low.   Writing the results in a diary or log helps the doctor know how to care for you.   Your blood sugar log should have the time, date and the results.   Also, write down the amount of insulin or other medicine that you take.   Other information, like what you ate, exercise done and how you were feeling, will also be helpful.     Notes:

## 2020-12-29 NOTE — Chronic Care Management (AMB) (Addendum)
Chronic Care Management   CCM RN Visit Note  12/27/2020 Name: [name] MRN: [id number] DOB: 30-Oct-1950  Subjective: [name] is a 70 y.o. year old female who is a primary care patient of [name], [place]. The care management team was consulted for assistance with disease management and care coordination needs.    Engaged with patient by telephone for follow up visit in response to provider referral for case management and/or care coordination services.   Consent to Services:  The patient was given information about Chronic Care Management services, agreed to services, and gave verbal consent prior to initiation of services.  Please see initial visit note for detailed documentation.   Patient agreed to services and verbal consent obtained.   Assessment: Review of patient past medical history, allergies, medications, health status, including review of consultants reports, laboratory and other test data, was performed as part of comprehensive evaluation and provision of chronic care management services.   SDOH (Social Determinants of Health) assessments and interventions performed:    CCM Care Plan  Allergies  Allergen Reactions  . Pollen Extract Other (See Comments)    Congestion/sneezing  . Tomato Hives and Itching  . Codeine Hives, Itching and Nausea And Vomiting    Outpatient Encounter Medications as of 12/27/2020  Medication Sig  . amLODipine (NORVASC) 5 MG tablet Take 1 tablet (5 mg total) by mouth at bedtime.  [name] aspirin EC 81 MG EC tablet Take 1 tablet (81 mg total) by mouth daily. Swallow whole.  . Continuous Blood Gluc Receiver (FREESTYLE LIBRE READER) DEVI 1 each by Does not apply route See admin instructions. CGM device-Freestyle Libre  . Continuous Blood Gluc Sensor (FREESTYLE LIBRE 14 DAY SENSOR) MISC 1 each by Does not apply route See admin instructions. CGM 14-day sensors; send refills to [facility] (in [place])  . FARXIGA 10 MG TABS tablet  TAKE ONE TABLET BY MOUTH BEFORE BREAKFAST (Patient taking differently: Take 10 mg by mouth daily.)  . insulin degludec (TRESIBA FLEXTOUCH) 100 UNIT/ML FlexTouch Pen Inject 15 Units into the skin daily.  . Insulin Pen Needle (BD PEN NEEDLE MICRO U/F) 32G X 6 MM MISC DX:E11.65 USE TO CHECK BLOOD SUGARS THREE TIMES A DAY  . Lancets Misc. MISC 1 each by Does not apply route 4 (four) times daily -  with meals and at bedtime.  [name] linaclotide (LINZESS) 145 MCG CAPS capsule Take 1 capsule (145 mcg total) by mouth daily before breakfast.  . mirabegron ER (MYRBETRIQ) 50 MG TB24 tablet Take 1 tablet (50 mg total) by mouth daily.  . Multiple Vitamin (MULTIVITAMIN WITH MINERALS) TABS tablet Take 1 tablet by mouth daily.  . pantoprazole (PROTONIX) 40 MG tablet Take 1 tablet (40 mg total) by mouth 2 (two) times daily. (Patient not taking: Reported on 12/13/2020)  . pravastatin (PRAVACHOL) 40 MG tablet TAKE ONE TABLET BY MOUTH EVERYDAY AT BEDTIME (Patient taking differently: Take 40 mg by mouth daily.)  . pregabalin (LYRICA) 50 MG capsule Take 1 capsule (50 mg total) by mouth 3 (three) times daily. (Patient taking differently: Take 50 mg by mouth daily.)  . Vitamin D, Ergocalciferol, (DRISDOL) 1.25 MG (50000 UNIT) CAPS capsule TAKE ONE CAPSULE BY MOUTH ONCE WEEKLY ON WEDNESDAY (Patient taking differently: Take 50,000 Units by mouth every 7 (seven) days.)   No facility-administered encounter medications on file as of 12/27/2020.    Patient Active Problem List   Diagnosis Date Noted  . Syncope 11/29/2020  . Mild aortic stenosis 11/29/2020  .  Irritable bowel syndrome with diarrhea 10/10/2020  . Gastroesophageal reflux disease with esophagitis 10/10/2020  . Postlaminectomy syndrome, not elsewhere classified 07/03/2020  . S/P lumbar laminectomy 03/30/2020  . Localized swelling, mass and lump, multiple sites 03/27/2018  . Type II diabetes mellitus with renal manifestations ([place]) 12/27/2017  . HLD (hyperlipidemia)  12/27/2017  . HTN (hypertension) 12/27/2017  . CKD (chronic kidney disease), stage III ([place]) 12/27/2017  . Cough variant asthma 12/01/2015  . Osteoarthritis of left knee, primary localized 08/17/2014  . Knee osteoarthritis 08/17/2014  . Hypoglycemia 11/15/2013  . Other and unspecified hyperlipidemia 08/06/2013  . Type II diabetes mellitus, uncontrolled ([place]) 07/20/2013  . Varicose veins of lower extremities with other complications 41/74/0814  . Fibromyalgia   . GERD 05/18/2010  . ABDOMINAL PAIN, GENERALIZED 05/02/2010  . OBESITY 04/20/2010  . BURSITIS, LEFT SHOULDER 03/09/2010  . Lipoma of arm s/p excision 01/29/2014 01/10/2010  . DEPRESSION 12/27/2009  . BACK PAIN WITH RADICULOPATHY 12/27/2009  . CYSTITIS, ACUTE 10/21/2009  . MICROSCOPIC HEMATURIA 10/18/2009  . KNEE PAIN, BILATERAL 10/18/2009  . NEUROPATHY 09/08/2009  . Asthma 09/08/2009  . STRESS INCONTINENCE 09/08/2009  . CHEST PAIN 07/19/2009  . ESOPHAGEAL STRICTURE 08/27/2005    Conditions to be addressed/monitored:DM, HTN, HLD, s/p laminectomy   Care Plan : Manage Diabetes Type 2 (Adult)  Updates made by [name], RN since 12/29/2020 12:00 AM    Problem: Glycemic Management (Diabetes, Type 2)   Priority: High    Long-Range Goal: Glycemic Management Optimized   Start Date: 08/02/2020  Expected End Date: 01/31/2021  Recent Progress: On track  Priority: High  Note:   Objective:  Lab Results  Component Value Date   HGBA1C 10.5 (H) 07/07/2020 .   Lab Results  Component Value Date   CREATININE 1.15 (H) 07/07/2020   CREATININE 1.24 (H) 05/03/2020   CREATININE 1.21 (H) 02/19/2020   Current Barriers:  [name] Knowledge Deficits related to basic Diabetes pathophysiology and self care/management . Knowledge Deficits related to medications used for management of diabetes Case Manager Clinical Goal(s):  [name] Over the next 180 days, patient will demonstrate improved adherence to prescribed treatment plan for diabetes self  care/management as evidenced by:  . daily monitoring and recording of CBG  . adherence to ADA/ carb modified diet . exercise 3-5 days/week . adherence to prescribed medication regimen Interventions:  . Collaboration with [name] FNP regarding development and update of comprehensive plan of care as evidenced by provider attestation and co-signature . Inter-disciplinary care team collaboration (see longitudinal plan of care) . 12/27/20 successful inbound call completed with patient  . Determined patient is currently not able to monitor her blood glucose levels due to having a delay with getting her [employer] filled by [facility]  . Determined the patient was informed by [facility] that they are awaiting for recent clinical notes from the PCP provider . Determined patient currently does not have a working manual glucometer, therefore she is not self monitoring her cbg's . Sent in basket message to PCP provider and embedded Pharm D to inform them of the reported Libre refill status per patient and requested follow up with [place] in order to confirm they have received the needed information  . Review of patient status, including review of consultants reports, relevant laboratory and other test results, and medications completed. . Reviewed medications with patient and discussed importance of medication adherence  Instructed patient to limit her carbohydrate intake and drink plenty of water; Stressed the importance  of dietary adherence and following hypoglycemic home protocol if symptomatic  Discussed plans with patient for ongoing care management follow up and provided patient with direct contact information for care management team Patient Goals/Self-Care Activities . Patient will:  - Self administers oral medications as prescribed Self administers insulin as prescribed Self administers injectable DM medication (Ozempic) as prescribed Attends all scheduled provider  appointments Checks blood sugars as prescribed and utilize hyper and hypoglycemia protocol as needed Adheres to prescribed ADA/carb modified  Follow Up Plan: Telephone follow up appointment with care management team member scheduled for: 01/09/21     Plan:Telephone follow up appointment with care management team member scheduled for:  01/09/21  [name], RN, BSN, CCM Care Management Coordinator [place] [facility]  Direct Phone: [phone]

## 2020-12-29 NOTE — Patient Instructions (Signed)
Goals Addressed    . Syncope - no complications or hospitalizations related to Syncope   On track    Timeframe:  Short-Term Goal Priority:  High Start Date:  12/22/20                           Expected End Date: 03/23/21   Next Follow up date: 01/09/21     Self Care Activities:  . Continue to keep all scheduled follow up appointments . Take medications as directed  . Let your healthcare team know if you are unable to take your medications . Call your pharmacy for refills at least 7 days prior to running out of medication . Remember to increase water intake to 64 oz of water daily unless otherwise directed, balance activity with rest and adhere to ADA diet . Continue monitor cbg's and BP at home, report abnormal readings or concerns promptly to PCP and or RN CM Patient Goals: - no hospitalization due to reoccuring syncope episodes

## 2020-12-29 NOTE — Telephone Encounter (Addendum)
Received message from [place], [place] that patient has not received CGM supplies. Called patient to confirm current address but there was no answer. Left a HIPAA compliant non-detailed voicemail to call back.   Will contact PCP team and collaborate to send patient immediate testing supplies to her current pharmacy so that she can check her BS at least four times per day. Schedule a pharmacist visit with the patient to review how to check BS.   Pharmacist team will follow up with [facility] to identify the status of patients CGM supplies and follow up with the patient regardless.    Total Time: 14 minutes   [name], PharmD Clinical Pharmacist [facility] [phone]

## 2020-12-29 NOTE — Telephone Encounter (Signed)
Called patient to schedule an appointment for her to be seen this month to help with medication adherence. Patient agreed to an appointment on 01/12/2021.   Total Time: 3 minutes   [name], PharmD Clinical Pharmacist [facility] [phone]

## 2020-12-29 NOTE — Chronic Care Management (AMB) (Addendum)
Chronic Care Management Pharmacy Assistant   Name: [name]  MRN: [id number] DOB: August 30, 1950  Reason for Encounter: Medication Review/ Medication Coordination Call/ Patient Assistance Coordination   Recent office visits:  12/13/2020- [name] (PCP) 12/22/2020- Gwendolyn Merino, RN (CCM) 12/27/2020- Gwendolyn Merino, RN (CCM)  Recent consult visits:  None  Hospital visits:  Medication Reconciliation was completed by comparing discharge summary, patient's EMR and Pharmacy list, and upon discussion with patient.  Admitted to the hospital on 11/29/2020 due to Syncope. Discharge date was 12/01/2020. Discharged from [facility]?Medications Started at [facility] Discharge:?? -started- None   Medication Changes at Hospital Discharge: -Changed Amlodipine 5 mg- 1 tablet at bedtime (strength decreased), Farxiga 10 mg- 1 tablet at before breakfast(dosage decreased), Pravastatin 40 mg- 1 tablet at bedtime (dosage increased), Lyrica 50 mg- 1 tablet three times daily(dosage increased), Tresiba 100 u/ml- Inject 15 units sq daily(dosage decreased), Vitamin D 50,000 units- Take 1 capsule once weekly on Wednesdays(directions changed)   Medications Discontinued at Hospital Discharge: -Stopped Humalog 100 u/ml and Spironolactone 50 mg due to Dehydration and Syncope   Medications that remain the same after Hospital Discharge:??  -All other medications will remain the same.     Reviewed chart for medication changes ahead of medication coordination call.  No recent medication changes since last PCP visit listed above.   BP Readings from Last 3 Encounters:  12/13/20 124/78  12/01/20 133/74  11/29/20 (!) 149/87    Lab Results  Component Value Date   HGBA1C 10.4 (H) 11/29/2020     Patient obtains medications through [facility]  90 Days   Last adherence delivery included: None last month, patient was in the hospital.   Medications not sent:  Women's 50 Plus  Multivitamin- 1 tablet daily (breakfast) Myrbetriq 50 mg- 1 tablet daily (breakfast) Spironolactone 50 mg- 1 tablet daily (breakfast) Amlodipine 10 mg- one tablet daily (bedtime) Pravastatin 40 mg- 1/2 tablet daily (bedtime) Ergocalciferol 50,000 units- 1 capsule weekly on Wednesdays                                                 Due to receiving a 90-day supply on 10/25/2020 Ozempic 1 mg- Inject 14m into skin once weekly Farxiga 10 gm- 1 tablet daily (before breakfast)                                     Due to receiving a 90-day supply on 09/05/2020 Amitiza 8 mg- 1 capsule daily due to being discontinued, no insurance coverage, replaced with Linzess.  Tresiba FlexTouch- Inject 18 units into skin daily due to receiving a 50-day supply on 08/16/2020. Patient states she will use 18-20 units daily depending on her dinner readings, she has 2 pens left. Gabapentin 300 mg- 1 capsule twice daily due to discontinuing medication. Patient states she doesn't feel like medication was helping so she stopped taking.  Novofine 32G x 613mneedles due to having an adequate supply.  Methocarbamol 750 mg- due to being discontinued. Pravastatin 80 mg due to medication being reduced to 40 mg.  Humalog Kwikpen- Inject 25 units sq at breakfast and 35 units sq at lunch due to receiving a 50 day supply on 10/12/2020.    Patient is due for  next adherence delivery on: 01/02/2021. Called patient and reviewed medications and coordinated delivery. After review medication, determined patient will need next adherence delivery for 12/30/2020.   This delivery to include: Women's 50 Plus Multivitamin- 1 tablet daily (breakfast) Myrbetriq 50 mg- 1 tablet daily (breakfast) Pravastatin 40 mg- 1/2 tablet daily (bedtime) Ergocalciferol 50,000 units- 1 capsule weekly on Wednesdays (breakfast) Farxiga 10 gm- 1 tablet daily (before breakfast) Pravastatin 40 mg- 1 tablet daily (bedtime) Linzess 145 mcg- 1 tablet daily(before  breakfast) Amlodipine 5 mg- 1 tablet daily (bedtime) Pantoprazole 40 mg- 1 tablet daily (breakfast) Lyrica 50 mg- 1 tablet three times a day (breakfast, lunch, bedtime)   No short fill or acute fill needed.   Patient declined the following medications:  [name] FlexTouch- Patient is now taking 15 units into skin daily, has 2 pens left. Ozempic 1 mg- Inject 9m into skin once weekly- on hold until patient is able to check blood sugar readings Humalog Kwikpen- Patient is now taking 10 units three times a day and using as a sliding scale, patient has 2 pens left.  Amitiza 8 mg- 1 capsule daily due to being discontinued, no insurance coverage, replaced with Linzess.  Gabapentin 300 mg- 1 capsule twice daily due to discontinuing medication. Patient states she doesn't feel like medication was helping so she stopped taking.  Novofine 32G x 671mneedles due to having an adequate supply.  Methocarbamol 750 mg- due to being discontinued.  Patient needs refills for: Ergocalciferol, Pantoprazole, Lyrica.- request sent to PCP office.  Confirmed delivery date of 12/30/2020, advised patient that pharmacy will contact them the morning of delivery.  Medications: Outpatient Encounter Medications as of 12/29/2020  Medication Sig   amLODipine (NORVASC) 5 MG tablet Take 1 tablet (5 mg total) by mouth at bedtime.   aspirin EC 81 MG EC tablet Take 1 tablet (81 mg total) by mouth daily. Swallow whole.   blood glucose meter kit and supplies KIT Dispense based on patient and insurance preference. Check blood sugars 4 times daily E11.69   Continuous Blood Gluc Receiver (FREESTYLE LIBRE READER) DEVI 1 each by Does not apply route See admin instructions. CGM device-Freestyle Libre   Continuous Blood Gluc Sensor (FREESTYLE LIBRE 14 DAY SENSOR) MISC 1 each by Does not apply route See admin instructions. CGM 14-day sensors; [facility] refills [facility])   FARXIGA 10 MG TABS tablet TAKE ONE TABLET BY MOUTH  BEFORE BREAKFAST (Patient taking differently: Take 10 mg by mouth daily.)   insulin degludec (TRESIBA FLEXTOUCH) 100 UNIT/ML FlexTouch Pen Inject 15 Units into the skin daily.   Insulin Pen Needle (BD PEN NEEDLE MICRO U/F) 32G X 6 MM MISC DX:E11.65 USE TO CHECK BLOOD SUGARS THREE TIMES A DAY   Lancets Misc. MISC 1 each by Does not apply route 4 (four) times daily -  with meals and at bedtime.   linaclotide (LINZESS) 145 MCG CAPS capsule Take 1 capsule (145 mcg total) by mouth daily before breakfast.   mirabegron ER (MYRBETRIQ) 50 MG TB24 tablet Take 1 tablet (50 mg total) by mouth daily.   Multiple Vitamin (MULTIVITAMIN WITH MINERALS) TABS tablet Take 1 tablet by mouth daily.   pantoprazole (PROTONIX) 40 MG tablet Take 1 tablet (40 mg total) by mouth 2 (two) times daily. (Patient not taking: Reported on 12/13/2020)   pravastatin (PRAVACHOL) 40 MG tablet TAKE ONE TABLET BY MOUTH EVERYDAY AT BEDTIME (Patient taking differently: Take 40 mg by mouth daily.)   pregabalin (LYRICA) 50 MG capsule  Take 1 capsule (50 mg total) by mouth 3 (three) times daily. (Patient taking differently: Take 50 mg by mouth daily.)   Vitamin D, Ergocalciferol, (DRISDOL) 1.25 MG (50000 UNIT) CAPS capsule TAKE ONE CAPSULE BY MOUTH ONCE WEEKLY ON WEDNESDAY (Patient taking differently: Take 50,000 Units by mouth every 7 (seven) days.)   No facility-administered encounter medications on file as of 12/29/2020.    Star Rating Drugs:  Farxiga 10 mg- Last filled 09/05/2020 for 90 day supply at [facility]. Pravastatin 40 mg- Last filled 10/25/2020 for 90 day supply at [facility] 1 mg- Last filled 09/05/2020 for 90 day supply at [facility]  Note: Patient states she is feeling better from hospital discharge, but still a little sluggish, tired at times. Patient states she is unable to check blood sugars due to running out of her Freestyle sensors which she gets from [employer]. So she does not know  what her sugars are running. Patient is monitoring her diet and adjusting to medication changes over the last month. Patient aware I will follow up with [facility] and PCP office on supplies. Patient very appreciative for the help.   Patient Assistance Coordination: [facility], spoke with representative Juliann Pulse, they are needing the most recent progress/clinical note for patient to approve her Freestyle [place] sensors. Message sent to [place] with [name], FNP requesting to fax note to [phone].  [name], CPP notified.   SIG: [name], [place] [phone]  I have reviewed the care management and care coordination activities outlined in this encounter and I am certifying that I agree with the content of this note. No further action required.  Mayford Knife, [facility] 01/06/21 12:42 PM  Total Time 4 minutes

## 2020-12-29 NOTE — Chronic Care Management (AMB) (Signed)
Chronic Care Management   CCM RN Visit Note  12/22/2020 Name: [name] MRN: [id number] DOB: [birth year]  Subjective: [name] is a 70 y.o. year old female who is a primary care patient of [name], [place]. The care management team was consulted for assistance with disease management and care coordination needs.    Engaged with patient by telephone for follow up visit in response to provider referral for case management and/or care coordination services.   Consent to Services:  The patient was given information about Chronic Care Management services, agreed to services, and gave verbal consent prior to initiation of services.  Please see initial visit note for detailed documentation.   Patient agreed to services and verbal consent obtained.   Assessment: Review of patient past medical history, allergies, medications, health status, including review of consultants reports, laboratory and other test data, was performed as part of comprehensive evaluation and provision of chronic care management services.   SDOH (Social Determinants of Health) assessments and interventions performed:  Yes, no acute needs   CCM Care Plan  Allergies  Allergen Reactions  . Pollen Extract Other (See Comments)    Congestion/sneezing  . Tomato Hives and Itching  . Codeine Hives, Itching and Nausea And Vomiting    Outpatient Encounter Medications as of 12/22/2020  Medication Sig  . amLODipine (NORVASC) 5 MG tablet Take 1 tablet (5 mg total) by mouth at bedtime.  [name] aspirin EC 81 MG EC tablet Take 1 tablet (81 mg total) by mouth daily. Swallow whole.  . Continuous Blood Gluc Receiver (FREESTYLE LIBRE READER) DEVI 1 each by Does not apply route See admin instructions. CGM device-Freestyle Libre  . Continuous Blood Gluc Sensor (FREESTYLE LIBRE 14 DAY SENSOR) MISC 1 each by Does not apply route See admin instructions. CGM 14-day sensors; send refills to [facility] (in [place])  . FARXIGA  10 MG TABS tablet TAKE ONE TABLET BY MOUTH BEFORE BREAKFAST (Patient taking differently: Take 10 mg by mouth daily.)  . insulin degludec (TRESIBA FLEXTOUCH) 100 UNIT/ML FlexTouch Pen Inject 15 Units into the skin daily.  . Insulin Pen Needle (BD PEN NEEDLE MICRO U/F) 32G X 6 MM MISC DX:E11.65 USE TO CHECK BLOOD SUGARS THREE TIMES A DAY  . Lancets Misc. MISC 1 each by Does not apply route 4 (four) times daily -  with meals and at bedtime.  [name] linaclotide (LINZESS) 145 MCG CAPS capsule Take 1 capsule (145 mcg total) by mouth daily before breakfast.  . mirabegron ER (MYRBETRIQ) 50 MG TB24 tablet Take 1 tablet (50 mg total) by mouth daily.  . Multiple Vitamin (MULTIVITAMIN WITH MINERALS) TABS tablet Take 1 tablet by mouth daily.  . pantoprazole (PROTONIX) 40 MG tablet Take 1 tablet (40 mg total) by mouth 2 (two) times daily. (Patient not taking: Reported on 12/13/2020)  . pravastatin (PRAVACHOL) 40 MG tablet TAKE ONE TABLET BY MOUTH EVERYDAY AT BEDTIME (Patient taking differently: Take 40 mg by mouth daily.)  . pregabalin (LYRICA) 50 MG capsule Take 1 capsule (50 mg total) by mouth 3 (three) times daily. (Patient taking differently: Take 50 mg by mouth daily.)  . Vitamin D, Ergocalciferol, (DRISDOL) 1.25 MG (50000 UNIT) CAPS capsule TAKE ONE CAPSULE BY MOUTH ONCE WEEKLY ON WEDNESDAY (Patient taking differently: Take 50,000 Units by mouth every 7 (seven) days.)   No facility-administered encounter medications on file as of 12/22/2020.    Patient Active Problem List   Diagnosis Date Noted  . Syncope 11/29/2020  . Mild  aortic stenosis 11/29/2020  . Irritable bowel syndrome with diarrhea 10/10/2020  . Gastroesophageal reflux disease with esophagitis 10/10/2020  . Postlaminectomy syndrome, not elsewhere classified 07/03/2020  . S/P lumbar laminectomy 03/30/2020  . Localized swelling, mass and lump, multiple sites 03/27/2018  . Type II diabetes mellitus with renal manifestations ([place]) 12/27/2017  . HLD  (hyperlipidemia) 12/27/2017  . HTN (hypertension) 12/27/2017  . CKD (chronic kidney disease), stage III ([place]) 12/27/2017  . Cough variant asthma 12/01/2015  . Osteoarthritis of left knee, primary localized 08/17/2014  . Knee osteoarthritis 08/17/2014  . Hypoglycemia 11/15/2013  . Other and unspecified hyperlipidemia 08/06/2013  . Type II diabetes mellitus, uncontrolled ([place]) 07/20/2013  . Varicose veins of lower extremities with other complications 76/80/8811  . Fibromyalgia   . GERD 05/18/2010  . ABDOMINAL PAIN, GENERALIZED 05/02/2010  . OBESITY 04/20/2010  . BURSITIS, LEFT SHOULDER 03/09/2010  . Lipoma of arm s/p excision 01/29/2014 01/10/2010  . DEPRESSION 12/27/2009  . BACK PAIN WITH RADICULOPATHY 12/27/2009  . CYSTITIS, ACUTE 10/21/2009  . MICROSCOPIC HEMATURIA 10/18/2009  . KNEE PAIN, BILATERAL 10/18/2009  . NEUROPATHY 09/08/2009  . Asthma 09/08/2009  . STRESS INCONTINENCE 09/08/2009  . CHEST PAIN 07/19/2009  . ESOPHAGEAL STRICTURE 08/27/2005    Conditions to be addressed/monitored:DM, HTN, HLD, s/p laminectomy   Care Plan : Manage Diabetes Type 2 (Adult)  Updates made by [name], RN since 12/29/2020 12:00 AM    Problem: Glycemic Management (Diabetes, Type 2)   Priority: High    Long-Range Goal: Glycemic Management Optimized   Start Date: 08/02/2020  Expected End Date: 01/31/2021  Recent Progress: On track  Priority: High  Note:   Objective:  Lab Results  Component Value Date   HGBA1C 10.5 (H) 07/07/2020 .   Lab Results  Component Value Date   CREATININE 1.15 (H) 07/07/2020   CREATININE 1.24 (H) 05/03/2020   CREATININE 1.21 (H) 02/19/2020   Current Barriers:  [name] Knowledge Deficits related to basic Diabetes pathophysiology and self care/management . Knowledge Deficits related to medications used for management of diabetes Case Manager Clinical Goal(s):  [name] Patient will demonstrate improved adherence to prescribed treatment plan for diabetes self  care/management as evidenced by:  . daily monitoring and recording of CBG  . adherence to ADA/ carb modified diet . exercise 3-5 days/week . adherence to prescribed medication regimen Interventions:  . Collaboration with [name] FNP regarding development and update of comprehensive plan of care as evidenced by provider attestation and co-signature . Inter-disciplinary care team collaboration (see longitudinal plan of care) . 12/27/20 successful inbound call completed with patient  . Determined patient is currently not able to monitor her blood glucose levels due to having a delay with getting her [employer] filled by [facility]  . Determined the patient was informed by [facility] that they are awaiting for recent clinical notes from the PCP provider . Determined patient currently does not have a working manual glucometer, therefore she is not self monitoring her cbg's . Sent in basket message to PCP provider and embedded Pharm D to inform them of the reported Libre refill status per patient and requested follow up with [facility] in order to confirm they have received the needed information  . Review of patient status, including review of consultants reports, relevant laboratory and other test results, and medications completed. . Reviewed medications with patient and discussed importance of medication adherence . Instructed patient to limit her carbohydrate intake and drink plenty of water; Stressed the importance  of dietary adherence and following hypoglycemic home protocol if symptomatic   Discussed plans with patient for ongoing care management follow up and provided patient with direct contact information for care management team 12/29/20 completed inbound call from patient . Collaboration with [name] FNP regarding development and update of comprehensive plan of care as evidenced by provider attestation and co-signature . Inter-disciplinary care team  collaboration (see longitudinal plan of care) . Determined patient was informed by [facility], the clinical notes requested by PCP have not been received, therefore the Rx for her [facility] will not be filled at this time . Placed joint call to [facility] to verify what missing documentation is needed  . Determined the most recent PCP OV note indicating the following is needed: DM diagnosis, note stating patient is administering insulin at least 3 times or more daily, reason for any medication adjustments and determined all must specified and signed by PCP provider, determined a medication list alone is not sufficient per Medicare guidelines; Determined the PCP can fax the notes to [facility] # [phone]. [name] Sent in basket message to PCP [name], FNP advising what is needed; Determined [name] FNP completed patient's last OV and will need to send the last OV note and send to [facility]  . Determined PCP sent an Rx for manual glucometer sent to patient's local pharmacy for pick up so she may resume cbg's at home pending her receipt of her [employer] system, patient is aware . Discussed plans with patient for ongoing care management follow up and provided patient with direct contact information for care management team  Patient Goals/Self-Care Activities . Patient will:  - Self administers oral medications as prescribed Self administers insulin as prescribed Self administers injectable DM medication (Ozempic) as prescribed Attends all scheduled provider appointments Checks blood sugars as prescribed and utilize hyper and hypoglycemia protocol as needed Adheres to prescribed ADA/carb modified - pick up glucometer from pharmacy and resume monitoring cbg's promptly - follow hypoglycemic protocol as directed, if needed   - continue to adhere to Diabetic diet as directed   Follow Up Plan: Telephone follow up appointment with care management team member  scheduled for: 01/09/21   Care Plan : Syncope  Updates made by [name], RN since 12/29/2020 12:00 AM    Problem: Syncope   Priority: High    Goal: Syncope - complications minimized or prevented   Start Date: 12/22/2020  Expected End Date: 03/23/2021  This Visit's Progress: On track  Priority: High  Note:   Current Barriers:   Ineffective Self Health Maintenance  Clinical Goal(s):  [name] Collaboration with [name], FNP regarding development and update of comprehensive plan of care as evidenced by provider attestation and co-signature . Inter-disciplinary care team collaboration (see longitudinal plan of care)  patient will work with care management team to address care coordination and chronic disease management needs related to Disease Management  Educational Needs  Care Coordination  Medication Management and Education  Psychosocial Support   Interventions:   Evaluation of current treatment plan related to  DM, HTN, HLD, s/p laminectomy  , self-management and patient's adherence to plan as established by provider.  Collaboration with [name], FNP regarding development and update of comprehensive plan of care as evidenced by provider attestation       and co-signature  Inter-disciplinary care team collaboration (see longitudinal plan of care)  12/22/20 completed outbound successful call with patient   Determined patient was admitted to  [facility] on 11/29/20-12/01/20 for dx: Syncope without known etiology for the cause  Determined patient completed a post discharge follow up appointment with PCP provider [name] FNP on 12/13/20 with the following Assessment/Plan reviewed: Assessment And Plan:     1. Hospital discharge follow-up -Patient here for a follow up from a recent visit from the hospital for a syncope episode she had. She received IV fluids. She has had some episodes in the past. Her orthostatic vitals were negative in the  hospital. At discharge sprinolactone and Humalog was held due to her BS being controlled in the hospital.    2. Uncontrolled type 2 diabetes mellitus with hyperglycemia (HCC) -Chronic, Poorly controlled  - Continue farxiga 10 mg, tresiba 15 units.  -She checks BS with libra at least 6 times or more daily. Before and after each meal.  -She will start 10 Units of Humalog with each meal. And keep a log of her BS and being it with her at her visit in May (next month) and we will reassess. -Consider referral to endocrinologist or diabetic educator in the future  - CBC no Diff - BMP8+eGFR - POCT UA - Microalbumin   3. Primary hypertension -Chronic, good control  -Continue with amlodipine. Held Spironolactone at this time.  -Educated patient to avoid salt intake, processed food.  -Educated patent  - CBC no Diff - BMP8+eGFR   . Review of patient status, including review of consultants reports, relevant laboratory and other test results, and medications completed. . Reviewed medications with patient and discussed importance of medication adherence . Instructed patient to continue monitoring cbg's and BP at home, reporting abnormal readings or concerns promptly to PCP and or RN CM . Educated on importance of drinking 64 oz of water daily unless otherwise directed, balancing activity with rest and adhering to ADA diet  Discussed plans with patient for ongoing care management follow up and provided patient with direct contact information for care management team Self Care Activities:  . Continue to keep all scheduled follow up appointments . Take medications as directed  . Let your healthcare team know if you are unable to take your medications . Call your pharmacy for refills at least 7 days prior to running out of medication . Remember to increase water intake to 64 oz of water daily unless otherwise directed, balance activity with rest and adhere to ADA diet . Continue monitor cbg's and BP at  home, report abnormal readings or concerns promptly to PCP and or RN CM Patient Goals: - no hospitalization due to reoccuring syncope episodes   Follow Up Plan: Telephone follow up appointment with care management team member scheduled for: 01/09/21    Plan:Telephone follow up appointment with care management team member scheduled for:  01/09/21  [name], RN, BSN, CCM Care Management Coordinator [place] [facility]  Direct Phone: [phone]

## 2020-12-29 NOTE — Chronic Care Management (AMB) (Signed)
Chronic Care Management   CCM RN Visit Note  12/29/2020 Name: [name] MRN: [id number] DOB: 06-07-1951  Subjective: [name] is a 70 y.o. year old female who is a primary care patient of [name], [place]. The care management team was consulted for assistance with disease management and care coordination needs.    Engaged with patient by telephone for follow up visit in response to provider referral for case management and/or care coordination services.   Consent to Services:  The patient was given information about Chronic Care Management services, agreed to services, and gave verbal consent prior to initiation of services.  Please see initial visit note for detailed documentation.   Patient agreed to services and verbal consent obtained.   Assessment: Review of patient past medical history, allergies, medications, health status, including review of consultants reports, laboratory and other test data, was performed as part of comprehensive evaluation and provision of chronic care management services.   SDOH (Social Determinants of Health) assessments and interventions performed:    CCM Care Plan  Allergies  Allergen Reactions  . Pollen Extract Other (See Comments)    Congestion/sneezing  . Tomato Hives and Itching  . Codeine Hives, Itching and Nausea And Vomiting    Outpatient Encounter Medications as of 12/29/2020  Medication Sig  . amLODipine (NORVASC) 5 MG tablet Take 1 tablet (5 mg total) by mouth at bedtime.  [name] aspirin EC 81 MG EC tablet Take 1 tablet (81 mg total) by mouth daily. Swallow whole.  . blood glucose meter kit and supplies KIT Dispense based on patient and insurance preference. Check blood sugars 4 times daily E11.69  . Continuous Blood Gluc Receiver (FREESTYLE LIBRE READER) DEVI 1 each by Does not apply route See admin instructions. CGM device-Freestyle Libre  . Continuous Blood Gluc Sensor (FREESTYLE LIBRE 14 DAY SENSOR) MISC 1 each by Does  not apply route See admin instructions. CGM 14-day sensors; send refills to [facility] (in [place])  . FARXIGA 10 MG TABS tablet TAKE ONE TABLET BY MOUTH BEFORE BREAKFAST (Patient taking differently: Take 10 mg by mouth daily.)  . insulin degludec (TRESIBA FLEXTOUCH) 100 UNIT/ML FlexTouch Pen Inject 15 Units into the skin daily.  . Insulin Pen Needle (BD PEN NEEDLE MICRO U/F) 32G X 6 MM MISC DX:E11.65 USE TO CHECK BLOOD SUGARS THREE TIMES A DAY  . Lancets Misc. MISC 1 each by Does not apply route 4 (four) times daily -  with meals and at bedtime.  [name] linaclotide (LINZESS) 145 MCG CAPS capsule Take 1 capsule (145 mcg total) by mouth daily before breakfast.  . mirabegron ER (MYRBETRIQ) 50 MG TB24 tablet Take 1 tablet (50 mg total) by mouth daily.  . Multiple Vitamin (MULTIVITAMIN WITH MINERALS) TABS tablet Take 1 tablet by mouth daily.  . pantoprazole (PROTONIX) 40 MG tablet Take 1 tablet (40 mg total) by mouth 2 (two) times daily. (Patient not taking: Reported on 12/13/2020)  . pravastatin (PRAVACHOL) 40 MG tablet TAKE ONE TABLET BY MOUTH EVERYDAY AT BEDTIME (Patient taking differently: Take 40 mg by mouth daily.)  . pregabalin (LYRICA) 50 MG capsule Take 1 capsule (50 mg total) by mouth 3 (three) times daily. (Patient taking differently: Take 50 mg by mouth daily.)  . Vitamin D, Ergocalciferol, (DRISDOL) 1.25 MG (50000 UNIT) CAPS capsule TAKE ONE CAPSULE BY MOUTH ONCE WEEKLY ON WEDNESDAY (Patient taking differently: Take 50,000 Units by mouth every 7 (seven) days.)   No facility-administered encounter medications on file as of 12/29/2020.  Patient Active Problem List   Diagnosis Date Noted  . Syncope 11/29/2020  . Mild aortic stenosis 11/29/2020  . Irritable bowel syndrome with diarrhea 10/10/2020  . Gastroesophageal reflux disease with esophagitis 10/10/2020  . Postlaminectomy syndrome, not elsewhere classified 07/03/2020  . S/P lumbar laminectomy 03/30/2020  . Localized swelling, mass and  lump, multiple sites 03/27/2018  . Type II diabetes mellitus with renal manifestations ([place]) 12/27/2017  . HLD (hyperlipidemia) 12/27/2017  . HTN (hypertension) 12/27/2017  . CKD (chronic kidney disease), stage III ([place]) 12/27/2017  . Cough variant asthma 12/01/2015  . Osteoarthritis of left knee, primary localized 08/17/2014  . Knee osteoarthritis 08/17/2014  . Hypoglycemia 11/15/2013  . Other and unspecified hyperlipidemia 08/06/2013  . Type II diabetes mellitus, uncontrolled ([place]) 07/20/2013  . Varicose veins of lower extremities with other complications 68/07/7516  . Fibromyalgia   . GERD 05/18/2010  . ABDOMINAL PAIN, GENERALIZED 05/02/2010  . OBESITY 04/20/2010  . BURSITIS, LEFT SHOULDER 03/09/2010  . Lipoma of arm s/p excision 01/29/2014 01/10/2010  . DEPRESSION 12/27/2009  . BACK PAIN WITH RADICULOPATHY 12/27/2009  . CYSTITIS, ACUTE 10/21/2009  . MICROSCOPIC HEMATURIA 10/18/2009  . KNEE PAIN, BILATERAL 10/18/2009  . NEUROPATHY 09/08/2009  . Asthma 09/08/2009  . STRESS INCONTINENCE 09/08/2009  . CHEST PAIN 07/19/2009  . ESOPHAGEAL STRICTURE 08/27/2005    Conditions to be addressed/monitored:DM, HTN, HLD, s/p laminectomy   Care Plan : Manage Diabetes Type 2 (Adult)  Updates made by [name], RN since 12/29/2020 12:00 AM    Problem: Glycemic Management (Diabetes, Type 2)   Priority: High    Long-Range Goal: Glycemic Management Optimized   Start Date: 08/02/2020  Expected End Date: 01/31/2021  Recent Progress: On track  Priority: High  Note:   Objective:  Lab Results  Component Value Date   HGBA1C 10.5 (H) 07/07/2020 .   Lab Results  Component Value Date   CREATININE 1.15 (H) 07/07/2020   CREATININE 1.24 (H) 05/03/2020   CREATININE 1.21 (H) 02/19/2020   Current Barriers:  [name] Knowledge Deficits related to basic Diabetes pathophysiology and self care/management . Knowledge Deficits related to medications used for management of diabetes Case Manager Clinical  Goal(s):  [name] Patient will demonstrate improved adherence to prescribed treatment plan for diabetes self care/management as evidenced by:  . daily monitoring and recording of CBG  . adherence to ADA/ carb modified diet . exercise 3-5 days/week . adherence to prescribed medication regimen Interventions:  . Collaboration with [name] FNP regarding development and update of comprehensive plan of care as evidenced by provider attestation and co-signature . Inter-disciplinary care team collaboration (see longitudinal plan of care) . 12/27/20 successful inbound call completed with patient  . Determined patient is currently not able to monitor her blood glucose levels due to having a delay with getting her [employer] filled by [facility]  . Determined the patient was informed by [place] that they are awaiting for recent clinical notes from the PCP provider . Determined patient currently does not have a working manual glucometer, therefore she is not self monitoring her cbg's . Sent in basket message to PCP provider and embedded Pharm D to inform them of the reported Libre refill status per patient and requested follow up with [place] in order to confirm they have received the needed information  . Review of patient status, including review of consultants reports, relevant laboratory and other test results, and medications completed. . Reviewed medications with patient and discussed importance of medication adherence .  Instructed patient to limit her carbohydrate intake and drink plenty of water; Stressed the importance of dietary adherence and following hypoglycemic home protocol if symptomatic   Discussed plans with patient for ongoing care management follow up and provided patient with direct contact information for care management team 12/29/20 completed inbound call from patient . Collaboration with [name] FNP regarding development and update of comprehensive plan  of care as evidenced by provider attestation and co-signature . Inter-disciplinary care team collaboration (see longitudinal plan of care) . Determined patient was informed by [place], the clinical notes requested by PCP have not been received, therefore the Rx for her [facility] will not be filled at this time . Placed joint call to [place] to verify what missing documentation is needed  . Determined the most recent PCP OV note indicating the following is needed: DM diagnosis, note stating patient is administering insulin at least 3 times or more daily, reason for any medication adjustments and determined all must specified and signed by PCP provider, determined a medication list alone is not sufficient per Medicare guidelines; Determined the PCP can fax the notes to [facility] # [phone]. [name] Sent in basket message to PCP [name], FNP advising what is needed; Determined [name] FNP completed patient's last OV and will need to send the last OV note and send to [facility]  . Determined PCP sent an Rx for manual glucometer sent to patient's local pharmacy for pick up so she may resume cbg's at home pending her receipt of her [employer] system, patient is aware . Discussed plans with patient for ongoing care management follow up and provided patient with direct contact information for care management team  Patient Goals/Self-Care Activities . Patient will:  - Self administers oral medications as prescribed Self administers insulin as prescribed Self administers injectable DM medication (Ozempic) as prescribed Attends all scheduled provider appointments Checks blood sugars as prescribed and utilize hyper and hypoglycemia protocol as needed Adheres to prescribed ADA/carb modified - pick up glucometer from pharmacy and resume monitoring cbg's promptly - follow hypoglycemic protocol as directed, if needed   - continue to adhere to Diabetic diet as  directed   Follow Up Plan: Telephone follow up appointment with care management team member scheduled for: 01/09/21    Plan:Telephone follow up appointment with care management team member scheduled for:  01/09/21  [name], RN, BSN, CCM Care Management Coordinator [place] [facility]  Direct Phone: [phone]

## 2021-01-02 ENCOUNTER — Telehealth: Payer: Medicare Other

## 2021-01-02 ENCOUNTER — Ambulatory Visit: Payer: Self-pay

## 2021-01-02 DIAGNOSIS — E1165 Type 2 diabetes mellitus with hyperglycemia: Secondary | ICD-10-CM | POA: Diagnosis not present

## 2021-01-02 DIAGNOSIS — E785 Hyperlipidemia, unspecified: Secondary | ICD-10-CM | POA: Diagnosis not present

## 2021-01-02 DIAGNOSIS — Z9889 Other specified postprocedural states: Secondary | ICD-10-CM

## 2021-01-02 DIAGNOSIS — I10 Essential (primary) hypertension: Secondary | ICD-10-CM | POA: Diagnosis not present

## 2021-01-05 ENCOUNTER — Ambulatory Visit: Payer: Medicare Other

## 2021-01-05 ENCOUNTER — Encounter: Payer: Medicare Other | Admitting: Nurse Practitioner

## 2021-01-09 ENCOUNTER — Telehealth: Payer: Medicare Other

## 2021-01-09 ENCOUNTER — Telehealth: Payer: Self-pay

## 2021-01-09 NOTE — Chronic Care Management (AMB) (Signed)
   Chronic Care Management Pharmacy Assistant   Name: [name]  MRN: [id number] DOB: 1951-05-12  Reason for Encounter: Patient Assistance Coordination   01/09/2021- Patient called regarding update on Freestyle Libre sensors from [facility]. Patient aware that PCP office faxed over office notes that were requested by [facility] on 01/02/2021 for supplies to be processed. Patient called [name] and they informed her that her order was still in process. Patient aware I will follow up with [facility] for status.   01/10/2021- Called [facility] to follow up on patient freestyle sensor supplies, Spoke with [name] she informed me that patients order was released to the warehouse today for shipment, Patient should receive supplies in the 2-3 days. Called patient, she is aware and very thankful for our help.  [name], CPP notified.    Medications: Outpatient Encounter Medications as of 01/09/2021  Medication Sig  . amLODipine (NORVASC) 5 MG tablet Take 1 tablet (5 mg total) by mouth at bedtime.  [name] aspirin EC 81 MG EC tablet Take 1 tablet (81 mg total) by mouth daily. Swallow whole.  . blood glucose meter kit and supplies KIT Dispense based on patient and insurance preference. Check blood sugars 4 times daily E11.69  . Continuous Blood Gluc Receiver (FREESTYLE LIBRE READER) DEVI 1 each by Does not apply route See admin instructions. CGM device-Freestyle Libre  . Continuous Blood Gluc Sensor (FREESTYLE LIBRE 14 DAY SENSOR) MISC 1 each by Does not apply route See admin instructions. CGM 14-day sensors; send refills to [facility] (in [place])  . FARXIGA 10 MG TABS tablet TAKE ONE TABLET BY MOUTH BEFORE BREAKFAST (Patient taking differently: Take 10 mg by mouth daily.)  . insulin degludec (TRESIBA FLEXTOUCH) 100 UNIT/ML FlexTouch Pen Inject 15 Units into the skin daily.  . Insulin Pen Needle (BD PEN NEEDLE MICRO U/F) 32G X 6 MM MISC DX:E11.65 USE TO  CHECK BLOOD SUGARS THREE TIMES A DAY  . Lancets Misc. MISC 1 each by Does not apply route 4 (four) times daily -  with meals and at bedtime.  [name] linaclotide (LINZESS) 145 MCG CAPS capsule Take 1 capsule (145 mcg total) by mouth daily before breakfast.  . mirabegron ER (MYRBETRIQ) 50 MG TB24 tablet Take 1 tablet (50 mg total) by mouth daily.  . Multiple Vitamin (MULTIVITAMIN WITH MINERALS) TABS tablet Take 1 tablet by mouth daily.  . pantoprazole (PROTONIX) 40 MG tablet Take 1 tablet (40 mg total) by mouth daily.  . pravastatin (PRAVACHOL) 40 MG tablet TAKE ONE TABLET BY MOUTH EVERYDAY AT BEDTIME (Patient taking differently: Take 40 mg by mouth daily.)  . pregabalin (LYRICA) 50 MG capsule Take 1 capsule (50 mg total) by mouth 3 (three) times daily. (Patient taking differently: Take 50 mg by mouth daily.)  . Vitamin D, Ergocalciferol, (DRISDOL) 1.25 MG (50000 UNIT) CAPS capsule TAKE ONE CAPSULE BY MOUTH ONCE WEEKLY ON WEDNESDAY   No facility-administered encounter medications on file as of 01/09/2021.    Star Rating Drugs: Farxiga 10 mg- Last filled 01/03/2021 for 30 day supply at [facility]. Pravastatin 40 mg- Last filled 01/03/2021 for 30 day supply at [facility] 1 mg- Last filled 09/05/2020 for 90 day supply at [place]: [name], [name]

## 2021-01-09 NOTE — Chronic Care Management (AMB) (Signed)
Chronic Care Management   CCM RN Visit Note  01/02/2021 Name: [name] MRN: [id number] DOB: 1951-01-30  Subjective: [name] is a 70 y.o. year old female who is a primary care patient of [name], [place]. The care management team was consulted for assistance with disease management and care coordination needs.    Engaged with patient by telephone for follow up visit in response to provider referral for case management and/or care coordination services.   Consent to Services:  The patient was given information about Chronic Care Management services, agreed to services, and gave verbal consent prior to initiation of services.  Please see initial visit note for detailed documentation.   Patient agreed to services and verbal consent obtained.   Assessment: Review of patient past medical history, allergies, medications, health status, including review of consultants reports, laboratory and other test data, was performed as part of comprehensive evaluation and provision of chronic care management services.   SDOH (Social Determinants of Health) assessments and interventions performed: Yes, no acute challenges noted  CCM Care Plan  Allergies  Allergen Reactions  . Pollen Extract Other (See Comments)    Congestion/sneezing  . Tomato Hives and Itching  . Codeine Hives, Itching and Nausea And Vomiting    Outpatient Encounter Medications as of 01/02/2021  Medication Sig  . amLODipine (NORVASC) 5 MG tablet Take 1 tablet (5 mg total) by mouth at bedtime.  [name] aspirin EC 81 MG EC tablet Take 1 tablet (81 mg total) by mouth daily. Swallow whole.  . blood glucose meter kit and supplies KIT Dispense based on patient and insurance preference. Check blood sugars 4 times daily E11.69  . Continuous Blood Gluc Receiver (FREESTYLE LIBRE READER) DEVI 1 each by Does not apply route See admin instructions. CGM device-Freestyle Libre  . Continuous Blood Gluc Sensor (FREESTYLE LIBRE 14  DAY SENSOR) MISC 1 each by Does not apply route See admin instructions. CGM 14-day sensors; send refills to [facility] (in [place])  . FARXIGA 10 MG TABS tablet TAKE ONE TABLET BY MOUTH BEFORE BREAKFAST (Patient taking differently: Take 10 mg by mouth daily.)  . insulin degludec (TRESIBA FLEXTOUCH) 100 UNIT/ML FlexTouch Pen Inject 15 Units into the skin daily.  . Insulin Pen Needle (BD PEN NEEDLE MICRO U/F) 32G X 6 MM MISC DX:E11.65 USE TO CHECK BLOOD SUGARS THREE TIMES A DAY  . Lancets Misc. MISC 1 each by Does not apply route 4 (four) times daily -  with meals and at bedtime.  [name] linaclotide (LINZESS) 145 MCG CAPS capsule Take 1 capsule (145 mcg total) by mouth daily before breakfast.  . mirabegron ER (MYRBETRIQ) 50 MG TB24 tablet Take 1 tablet (50 mg total) by mouth daily.  . Multiple Vitamin (MULTIVITAMIN WITH MINERALS) TABS tablet Take 1 tablet by mouth daily.  . pantoprazole (PROTONIX) 40 MG tablet Take 1 tablet (40 mg total) by mouth daily.  . pravastatin (PRAVACHOL) 40 MG tablet TAKE ONE TABLET BY MOUTH EVERYDAY AT BEDTIME (Patient taking differently: Take 40 mg by mouth daily.)  . pregabalin (LYRICA) 50 MG capsule Take 1 capsule (50 mg total) by mouth 3 (three) times daily. (Patient taking differently: Take 50 mg by mouth daily.)  . Vitamin D, Ergocalciferol, (DRISDOL) 1.25 MG (50000 UNIT) CAPS capsule TAKE ONE CAPSULE BY MOUTH ONCE WEEKLY ON WEDNESDAY   No facility-administered encounter medications on file as of 01/02/2021.    Patient Active Problem List   Diagnosis Date Noted  . Syncope 11/29/2020  .  Mild aortic stenosis 11/29/2020  . Irritable bowel syndrome with diarrhea 10/10/2020  . Gastroesophageal reflux disease with esophagitis 10/10/2020  . Postlaminectomy syndrome, not elsewhere classified 07/03/2020  . S/P lumbar laminectomy 03/30/2020  . Localized swelling, mass and lump, multiple sites 03/27/2018  . Type II diabetes mellitus with renal manifestations ([place]) 12/27/2017   . HLD (hyperlipidemia) 12/27/2017  . HTN (hypertension) 12/27/2017  . CKD (chronic kidney disease), stage III ([place]) 12/27/2017  . Cough variant asthma 12/01/2015  . Osteoarthritis of left knee, primary localized 08/17/2014  . Knee osteoarthritis 08/17/2014  . Hypoglycemia 11/15/2013  . Other and unspecified hyperlipidemia 08/06/2013  . Type II diabetes mellitus, uncontrolled ([place]) 07/20/2013  . Varicose veins of lower extremities with other complications 23/76/2831  . Fibromyalgia   . GERD 05/18/2010  . ABDOMINAL PAIN, GENERALIZED 05/02/2010  . OBESITY 04/20/2010  . BURSITIS, LEFT SHOULDER 03/09/2010  . Lipoma of arm s/p excision 01/29/2014 01/10/2010  . DEPRESSION 12/27/2009  . BACK PAIN WITH RADICULOPATHY 12/27/2009  . CYSTITIS, ACUTE 10/21/2009  . MICROSCOPIC HEMATURIA 10/18/2009  . KNEE PAIN, BILATERAL 10/18/2009  . NEUROPATHY 09/08/2009  . Asthma 09/08/2009  . STRESS INCONTINENCE 09/08/2009  . CHEST PAIN 07/19/2009  . ESOPHAGEAL STRICTURE 08/27/2005    Conditions to be addressed/monitored:DM, HTN, HLD, s/p laminectomy   Care Plan : Manage low back pain  Updates made by [name], RN since 01/09/2021 12:00 AM    Problem: Chronic Low Back Pain   Priority: High    Long-Range Goal: Chronic Low Back Pain Managed   Start Date: 08/02/2020  Expected End Date: 08/02/2021  Recent Progress: On track  Priority: High  Note:   Current Barriers:   Ineffective Self Health Maintenance  Currently UNABLE TO independently self manage needs related to chronic health conditions.   Knowledge Deficits related to short term plan for care coordination needs and long term plans for chronic disease management needs Nurse Case Manager Clinical Goal(s):   Over the next 180 days, patient will work with care management team to address care coordination and chronic disease management needs related to Disease Management  Educational Needs  Care Coordination  Medication Management and  Education  Psychosocial Support   Interventions:  11/16/20 successful call completed with patient   Encouraged use of multimodal interventions, such as physical therapy, distraction,  relaxation, and or application of heat or cold.   Evaluated pain level, efficacy, tolerability and adherence to pain management plan.   Assessed if pain is associated with mobility or at rest, location, intensity, frequency, duration, recurrence, pattern and description (e.g., cramping, burning, aching), triggers and relieving factors.    Determined patient continues to follow up with Orthopedics for management of her back pain   Reviewed and discussed next scheduled f/u with Orthopedics set for 12/02/20 at which time patient will be re-evaluated for repeat steroid injection   Educated on importance to continue adherence to HEP as directed   Discussed plans with patient for ongoing care management follow up and provided patient with direct contact information for care management team Patient Goals/Self Care/Activities:  - call for medicine refill 2 or 3 days before it runs out  - plan exercise or activity when pain is best controlled  - prioritize tasks for the day  - stay active  - track times pain is worst and when it is best  - track what makes the pain worse and what makes it better  - work slower and less intense when having pain  Follow Up Plan: Telephone follow up appointment with care management team member scheduled for:  03/02/21        Problem: Functional Decline   Priority: High    Long-Range Goal: Maintain Mobility and Function   Start Date: 08/02/2020  Expected End Date: 01/31/2021  Recent Progress: On track  Priority: High  Note:   Current Barriers:   Ineffective Self Health Maintenance  Currently UNABLE TO independently self manage needs related to chronic health conditions.   Knowledge Deficits related to short term plan for care coordination needs and long term plans  for chronic disease management needs Nurse Case Manager Clinical Goal(s):   Over the next 180 days, patient will work with care management team to address care coordination and chronic disease management needs related to Disease Management  Educational Needs  Care Coordination  Medication Management and Education  Psychosocial Support   Interventions: 11/16/20 call completed with patient    Emphasized the importance of physical activity and aerobic exercise as included in treatment plan; assess barriers to adherence; consider patient's abilities and preferences.   Encouraged gradual increase in activity or exercise instead of stopping if pain occurs.   Reinforced individual therapy exercise prescription, such as strengthening, stabilization and stretching programs.   Encouraged activity and mobility modifications to facilitate optimal function, such as using a log roll for bed mobility or dressing from a seated position.   Reinforced individual adaptive equipment recommendations to limit excessive spinal movements, such as a Systems analyst.   Discussed plans with patient for ongoing care management follow up and provided patient with direct contact information for care management team Patient Goals/Self Care/Activities: - plan exercise or activity when pain is best controlled - prioritize tasks for the day - stay active - check with my doctor before adding more exercise than I usually do - choose a type of activity I enjoy  - work slower and less intense when having pain   Follow Up Plan: Telephone follow up appointment with care management team member scheduled for: 03/02/21       Care Plan : Manage Diabetes Type 2 (Adult)  Updates made by [name], RN since 01/09/2021 12:00 AM    Problem: Glycemic Management (Diabetes, Type 2)   Priority: High    Long-Range Goal: Glycemic Management Optimized   Start Date: 08/02/2020  Expected End Date: 08/02/2021  Recent  Progress: On track  Priority: High  Note:   Objective:  Lab Results  Component Value Date   HGBA1C 10.5 (H) 07/07/2020 .   Lab Results  Component Value Date   CREATININE 1.15 (H) 07/07/2020   CREATININE 1.24 (H) 05/03/2020   CREATININE 1.21 (H) 02/19/2020   Current Barriers:  [name] Knowledge Deficits related to basic Diabetes pathophysiology and self care/management . Knowledge Deficits related to medications used for management of diabetes Case Manager Clinical Goal(s):  [name] Patient will demonstrate improved adherence to prescribed treatment plan for diabetes self care/management as evidenced by:  . daily monitoring and recording of CBG  . adherence to ADA/ carb modified diet . exercise 3-5 days/week . adherence to prescribed medication regimen Interventions:  01/02/21 completed successful outbound call with patient  . Collaboration with [name] FNP regarding development and update of comprehensive plan of care as evidenced by provider attestation and co-signature . Inter-disciplinary care team collaboration (see longitudinal plan of care) . Review of patient status, including review of consultants reports, relevant laboratory and other test results, and medications completed. . Reviewed medications  with patient and discussed importance of medication adherence . Instructed patient to limit her carbohydrate intake and drink plenty of water; Stressed the importance of dietary adherence and following hypoglycemic home protocol if symptomatic  . Determined patient has picked up her manual glucometer and is self checking her cbg's . Determined patient continues to be informed by [facility] that they are awaiting for recent clinical notes from the PCP provider . Sent in basket message to PCP provider and embedded Pharm D to inform them of the reported [place] refill status per patient and requested follow up with [facility] in order to confirm they have received the needed  information   Discussed plans with patient for ongoing care management follow up and provided patient with direct contact information for care management team Patient Goals/Self-Care Activities . Patient will:  - Self administers oral medications as prescribed Self administers insulin as prescribed Self administers injectable DM medication (Ozempic) as prescribed Attends all scheduled provider appointments Checks blood sugars as prescribed and utilize hyper and hypoglycemia protocol as needed Adheres to prescribed ADA/carb modified - pick up glucometer from pharmacy and resume monitoring cbg's promptly - follow hypoglycemic protocol as directed, if needed   - continue to adhere to Diabetic diet as directed   Follow Up Plan: Telephone follow up appointment with care management team member scheduled for: 03/02/21     Problem: Disease Progression (Diabetes, Type 2)   Priority: High    Long-Range Goal: Disease Progression Prevented or Minimized   Start Date: 08/02/2020  Expected End Date: 08/02/2021  Recent Progress: On track  Priority: High  Note:   Objective:  Lab Results  Component Value Date   HGBA1C 10.5 (H) 07/07/2020 .   Lab Results  Component Value Date   CREATININE 1.15 (H) 07/07/2020   CREATININE 1.24 (H) 05/03/2020   CREATININE 1.21 (H) 02/19/2020   Current Barriers:  [name] Knowledge Deficits related to basic Diabetes pathophysiology and self care/management Case Manager Clinical Goal(s):  [name] Over the next 180 days, patient will demonstrate improved adherence to prescribed treatment plan for diabetes self care/management as evidenced by:  . daily monitoring and recording of CBG  . adherence to ADA/ carb modified diet . exercise 3-5 days/week . adherence to prescribed medication regimen Interventions:  01/02/21 completed call with patient  . Provided education to patient about basic DM disease process . Reviewed medications with patient and discussed importance of  medication adherence  Ensured completion of annual comprehensive foot exam and dilated eye exam.    Implemented additional individualized goals and interventions based on identified risk factors.   Encouraged lifestyle changes, such as increased intake of plant-based foods, stress reduction, consistent physical activity and smoking cessation to prevent long-term complications and chronic disease.    Individualized activity and exercise recommendations while considering potential limitations, such as neuropathy, retinopathy or the ability to prevent hyperglycemia or hypoglycemia.    Assessed for signs/symptoms and risk factors for hypertension, sleep-disordered breathing, neuropathy (including changes in gait and balance), retinopathy, nephropathy and sexual dysfunction.   Discussed plans with patient for ongoing care management follow up and provided patient with direct contact information for care management team Patient Goals/Self-Care Activities . Over the next 180 days, patient will:  - Self administers oral medications as prescribed Self administers insulin as prescribed Self administers injectable DM medication (Ozempic) as prescribed Attends all scheduled provider appointments Checks blood sugars as prescribed and utilize hyper and hypoglycemia protocol as needed Adheres to prescribed ADA/carb modified  Follow Up Plan: Telephone follow up appointment with care management team member scheduled for: 03/02/21    Care Plan : Syncope  Updates made by [name], RN since 01/09/2021 12:00 AM    Problem: Syncope   Priority: High    Goal: Syncope - complications minimized or prevented   Start Date: 12/22/2020  Expected End Date: 12/22/2021  Recent Progress: On track  Priority: High  Note:   Current Barriers:   Ineffective Self Health Maintenance  Clinical Goal(s):  [name] Collaboration with [name], FNP regarding development and update of comprehensive plan of care as  evidenced by provider attestation and co-signature . Inter-disciplinary care team collaboration (see longitudinal plan of care)  patient will work with care management team to address care coordination and chronic disease management needs related to Disease Management  Educational Needs  Care Coordination  Medication Management and Education  Psychosocial Support   Interventions:   Evaluation of current treatment plan related to  DM, HTN, HLD, s/p laminectomy  , self-management and patient's adherence to plan as established by provider.  Collaboration with [name], FNP regarding development and update of comprehensive plan of care as evidenced by provider attestation       and co-signature  Inter-disciplinary care team collaboration (see longitudinal plan of care)  12/22/20 completed outbound successful call with patient   Determined patient was admitted to [facility] on 11/29/20-12/01/20 for dx: Syncope without known etiology for the cause  Determined patient completed a post discharge follow up appointment with PCP provider [name] FNP on 12/13/20 with the following Assessment/Plan reviewed: Assessment And Plan:     1. Hospital discharge follow-up -Patient here for a follow up from a recent visit from the hospital for a syncope episode she had. She received IV fluids. She has had some episodes in the past. Her orthostatic vitals were negative in the hospital. At discharge sprinolactone and Humalog was held due to her BS being controlled in the hospital.    2. Uncontrolled type 2 diabetes mellitus with hyperglycemia (HCC) -Chronic, Poorly controlled  - Continue farxiga 10 mg, tresiba 15 units.  -She checks BS with libra at least 6 times or more daily. Before and after each meal.  -She will start 10 Units of Humalog with each meal. And keep a log of her BS and being it with her at her visit in May (next month) and we will reassess. -Consider referral to  endocrinologist or diabetic educator in the future  - CBC no Diff - BMP8+eGFR - POCT UA - Microalbumin   3. Primary hypertension -Chronic, good control  -Continue with amlodipine. Held Spironolactone at this time.  -Educated patient to avoid salt intake, processed food.  -Educated patent  - CBC no Diff - BMP8+eGFR   . Review of patient status, including review of consultants reports, relevant laboratory and other test results, and medications completed. . Reviewed medications with patient and discussed importance of medication adherence . Instructed patient to continue monitoring cbg's and BP at home, reporting abnormal readings or concerns promptly to PCP and or RN CM . Educated on importance of drinking 64 oz of water daily unless otherwise directed, balancing activity with rest and adhering to ADA diet  Discussed plans with patient for ongoing care management follow up and provided patient with direct contact information for care management team Self Care Activities:  . Continue to keep all scheduled follow up appointments . Take medications as directed  . Let your healthcare  team know if you are unable to take your medications . Call your pharmacy for refills at least 7 days prior to running out of medication . Remember to increase water intake to 64 oz of water daily unless otherwise directed, balance activity with rest and adhere to ADA diet . Continue monitor cbg's and BP at home, report abnormal readings or concerns promptly to PCP and or RN CM Patient Goals: - no hospitalization due to reoccuring syncope episodes   Follow Up Plan: Telephone follow up appointment with care management team member scheduled for: 03/02/21    Plan:Telephone follow up appointment with care management team member scheduled for:  7/722  [name], RN, BSN, CCM Care Management Coordinator [facility]  Direct Phone: [phone]

## 2021-01-09 NOTE — Patient Instructions (Signed)
Goals Addressed    . Monitor and Manage My Blood Sugar-Diabetes Type 2   On track    Timeframe:  Long-Range Goal Priority:  High Start Date: 08/02/20                            Expected End Date: 01/31/21                      Follow Up Date: 03/02/21  - check blood sugar at prescribed times - check blood sugar before and after exercise - check blood sugar if I feel it is too high or too low - enter blood sugar readings and medication or insulin into daily log - take the blood sugar log to all doctor visits - take the blood sugar meter to all doctor visits    Why is this important?    Checking your blood sugar at home helps to keep it from getting very high or very low.   Writing the results in a diary or log helps the doctor know how to care for you.   Your blood sugar log should have the time, date and the results.   Also, write down the amount of insulin or other medicine that you take.   Other information, like what you ate, exercise done and how you were feeling, will also be helpful.     Notes:

## 2021-01-10 ENCOUNTER — Telehealth: Payer: Self-pay

## 2021-01-10 ENCOUNTER — Telehealth: Payer: Medicare Other

## 2021-01-10 DIAGNOSIS — Z794 Long term (current) use of insulin: Secondary | ICD-10-CM | POA: Diagnosis not present

## 2021-01-10 DIAGNOSIS — E118 Type 2 diabetes mellitus with unspecified complications: Secondary | ICD-10-CM | POA: Diagnosis not present

## 2021-01-10 NOTE — Telephone Encounter (Signed)
  Care Management   Follow Up Note   01/10/2021 Name: [name] MRN: [id number] DOB: 1950/09/02   Referred by: [name], FNP Reason for referral : Chronic Care Management (Inbound call from patient )  Voice message received from patient requesting a return phone call. An unsuccessful telephone outreach was attempted today. The patient was referred to the case management team for assistance with care management and care coordination.    Follow Up Plan: A HIPPA compliant phone message was left for the patient providing contact information and requesting a return call.   [name], RN, BSN, CCM Care Management Coordinator [place] [facility]  Direct Phone: [phone]

## 2021-01-11 ENCOUNTER — Ambulatory Visit (INDEPENDENT_AMBULATORY_CARE_PROVIDER_SITE_OTHER): Payer: Medicare Other

## 2021-01-11 VITALS — Ht 64.0 in | Wt 146.0 lb

## 2021-01-11 DIAGNOSIS — Z0000 Encounter for general adult medical examination without abnormal findings: Secondary | ICD-10-CM | POA: Diagnosis not present

## 2021-01-11 NOTE — Patient Instructions (Signed)
[name] , Thank you for taking time to come for your Medicare Wellness Visit. I appreciate your ongoing commitment to your health goals. Please review the following plan we discussed and let me know if I can assist you in the future.   Screening recommendations/referrals: Colonoscopy: completed 12/12/2016 Mammogram: patient to schedule Bone Density: completed 07/10/2019 Recommended yearly ophthalmology/optometry visit for glaucoma screening and checkup Recommended yearly dental visit for hygiene and checkup  Vaccinations: Influenza vaccine: completed 05/03/2020, due 03/27/2021 Pneumococcal vaccine: completed 03/24/2019 Tdap vaccine: completed 12/27/2017, due 12/28/2027 Shingles vaccine: discussed   Covid-19: 05/24/2020, 10/02/2019, 09/21/2019  Advanced directives: Advance directive discussed with you today.   Conditions/risks identified: none  Next appointment: Follow up in one year for your annual wellness visit    Preventive Care 65 Years and Older, Female Preventive care refers to lifestyle choices and visits with your health care provider that can promote health and wellness. What does preventive care include?  A yearly physical exam. This is also called an annual well check.  Dental exams once or twice a year.  Routine eye exams. Ask your health care provider how often you should have your eyes checked.  Personal lifestyle choices, including:  Daily care of your teeth and gums.  Regular physical activity.  Eating a healthy diet.  Avoiding tobacco and drug use.  Limiting alcohol use.  Practicing safe sex.  Taking low-dose aspirin every day.  Taking vitamin and mineral supplements as recommended by your health care provider. What happens during an annual well check? The services and screenings done by your health care provider during your annual well check will depend on your age, overall health, lifestyle risk factors, and family history of disease. Counseling  Your  health care provider may ask you questions about your:  Alcohol use.  Tobacco use.  Drug use.  Emotional well-being.  Home and relationship well-being.  Sexual activity.  Eating habits.  History of falls.  Memory and ability to understand (cognition).  Work and work Statistician.  Reproductive health. Screening  You may have the following tests or measurements:  Height, weight, and BMI.  Blood pressure.  Lipid and cholesterol levels. These may be checked every 5 years, or more frequently if you are over 80 years old.  Skin check.  Lung cancer screening. You may have this screening every year starting at age 24 if you have a 30-pack-year history of smoking and currently smoke or have quit within the past 15 years.  Fecal occult blood test (FOBT) of the stool. You may have this test every year starting at age 48.  Flexible sigmoidoscopy or colonoscopy. You may have a sigmoidoscopy every 5 years or a colonoscopy every 10 years starting at age 68.  Hepatitis C blood test.  Hepatitis B blood test.  Sexually transmitted disease (STD) testing.  Diabetes screening. This is done by checking your blood sugar (glucose) after you have not eaten for a while (fasting). You may have this done every 1-3 years.  Bone density scan. This is done to screen for osteoporosis. You may have this done starting at age 6.  Mammogram. This may be done every 1-2 years. Talk to your health care provider about how often you should have regular mammograms. Talk with your health care provider about your test results, treatment options, and if necessary, the need for more tests. Vaccines  Your health care provider may recommend certain vaccines, such as:  Influenza vaccine. This is recommended every year.  Tetanus, diphtheria, and  acellular pertussis (Tdap, Td) vaccine. You may need a Td booster every 10 years.  Zoster vaccine. You may need this after age 73.  Pneumococcal 13-valent  conjugate (PCV13) vaccine. One dose is recommended after age 101.  Pneumococcal polysaccharide (PPSV23) vaccine. One dose is recommended after age 72. Talk to your health care provider about which screenings and vaccines you need and how often you need them. This information is not intended to replace advice given to you by your health care provider. Make sure you discuss any questions you have with your health care provider. Document Released: 09/09/2015 Document Revised: 05/02/2016 Document Reviewed: 06/14/2015 Elsevier Interactive Patient Education  2017 [place] Prevention in the Home Falls can cause injuries. They can happen to people of all ages. There are many things you can do to make your home safe and to help prevent falls. What can I do on the outside of my home?  Regularly fix the edges of walkways and driveways and fix any cracks.  Remove anything that might make you trip as you walk through a door, such as a raised step or threshold.  Trim any bushes or trees on the path to your home.  Use bright outdoor lighting.  Clear any walking paths of anything that might make someone trip, such as rocks or tools.  Regularly check to see if handrails are loose or broken. Make sure that both sides of any steps have handrails.  Any raised decks and porches should have guardrails on the edges.  Have any leaves, snow, or ice cleared regularly.  Use sand or salt on walking paths during winter.  Clean up any spills in your garage right away. This includes oil or grease spills. What can I do in the bathroom?  Use night lights.  Install grab bars by the toilet and in the tub and shower. Do not use towel bars as grab bars.  Use non-skid mats or decals in the tub or shower.  If you need to sit down in the shower, use a plastic, non-slip stool.  Keep the floor dry. Clean up any water that spills on the floor as soon as it happens.  Remove soap buildup in the tub or  shower regularly.  Attach bath mats securely with double-sided non-slip rug tape.  Do not have throw rugs and other things on the floor that can make you trip. What can I do in the bedroom?  Use night lights.  Make sure that you have a light by your bed that is easy to reach.  Do not use any sheets or blankets that are too big for your bed. They should not hang down onto the floor.  Have a firm chair that has side arms. You can use this for support while you get dressed.  Do not have throw rugs and other things on the floor that can make you trip. What can I do in the kitchen?  Clean up any spills right away.  Avoid walking on wet floors.  Keep items that you use a lot in easy-to-reach places.  If you need to reach something above you, use a strong step stool that has a grab bar.  Keep electrical cords out of the way.  Do not use floor polish or wax that makes floors slippery. If you must use wax, use non-skid floor wax.  Do not have throw rugs and other things on the floor that can make you trip. What can I do with my stairs?  Do not leave any items on the stairs.  Make sure that there are handrails on both sides of the stairs and use them. Fix handrails that are broken or loose. Make sure that handrails are as long as the stairways.  Check any carpeting to make sure that it is firmly attached to the stairs. Fix any carpet that is loose or worn.  Avoid having throw rugs at the top or bottom of the stairs. If you do have throw rugs, attach them to the floor with carpet tape.  Make sure that you have a light switch at the top of the stairs and the bottom of the stairs. If you do not have them, ask someone to add them for you. What else can I do to help prevent falls?  Wear shoes that:  Do not have high heels.  Have rubber bottoms.  Are comfortable and fit you well.  Are closed at the toe. Do not wear sandals.  If you use a stepladder:  Make sure that it is fully  opened. Do not climb a closed stepladder.  Make sure that both sides of the stepladder are locked into place.  Ask someone to hold it for you, if possible.  Clearly mark and make sure that you can see:  Any grab bars or handrails.  First and last steps.  Where the edge of each step is.  Use tools that help you move around (mobility aids) if they are needed. These include:  Canes.  Walkers.  Scooters.  Crutches.  Turn on the lights when you go into a dark area. Replace any light bulbs as soon as they burn out.  Set up your furniture so you have a clear path. Avoid moving your furniture around.  If any of your floors are uneven, fix them.  If there are any pets around you, be aware of where they are.  Review your medicines with your doctor. Some medicines can make you feel dizzy. This can increase your chance of falling. Ask your doctor what other things that you can do to help prevent falls. This information is not intended to replace advice given to you by your health care provider. Make sure you discuss any questions you have with your health care provider. Document Released: 06/09/2009 Document Revised: 01/19/2016 Document Reviewed: 09/17/2014 Elsevier Interactive Patient Education  2017 [employer].

## 2021-01-11 NOTE — Progress Notes (Signed)
I connected with [name] today by telephone and verified that I am speaking with the correct person using two identifiers. Location patient: home Location provider: work Persons participating in the virtual visit: Skylin, Kennerson LPN.   I discussed the limitations, risks, security and privacy concerns of performing an evaluation and management service by telephone and the availability of in person appointments. I also discussed with the patient that there may be a patient responsible charge related to this service. The patient expressed understanding and verbally consented to this telephonic visit.    Interactive audio and video telecommunications were attempted between this provider and patient, however failed, due to patient having technical difficulties OR patient did not have access to video capability.  We continued and completed visit with audio only.     Vital signs may be patient reported or missing.  Subjective:   [name] is a 70 y.o. female who presents for Medicare Annual (Subsequent) preventive examination.  Review of Systems     Cardiac Risk Factors include: advanced age (>30mn, >>84women);diabetes mellitus;dyslipidemia;hypertension;sedentary lifestyle     Objective:    Today's Vitals   01/11/21 1127  Weight: 146 lb (66.2 kg)  Height: 5' 4"  (1.626 m)  PainSc: 7    Body mass index is 25.06 kg/m.  Advanced Directives 01/11/2021 11/29/2020 02/19/2020 02/13/2020 02/10/2020 12/31/2019 04/07/2019  Does Patient Have a Medical Advance Directive? No No No No No No No  Would patient like information on creating a medical advance directive? - - No - Patient declined No - Patient declined No - Patient declined No - Patient declined No - Patient declined  Pre-existing out of facility DNR order (yellow form or pink MOST form) - - - - - - -    Current Medications (verified) Outpatient Encounter Medications as of 01/11/2021  Medication Sig  .  amLODipine (NORVASC) 5 MG tablet Take 1 tablet (5 mg total) by mouth at bedtime.  .Marland Kitchenaspirin EC 81 MG EC tablet Take 1 tablet (81 mg total) by mouth daily. Swallow whole.  . blood glucose meter kit and supplies KIT Dispense based on patient and insurance preference. Check blood sugars 4 times daily E11.69  . Continuous Blood Gluc Receiver (FREESTYLE LIBRE READER) DEVI 1 each by Does not apply route See admin instructions. CGM device-Freestyle Libre  . Continuous Blood Gluc Sensor (FREESTYLE LIBRE 14 DAY SENSOR) MISC 1 each by Does not apply route See admin instructions. CGM 14-day sensors; [place] [facility]  . FARXIGA 10 MG TABS tablet TAKE ONE TABLET BY MOUTH BEFORE BREAKFAST (Patient taking differently: Take 10 mg by mouth daily.)  . insulin degludec (TRESIBA FLEXTOUCH) 100 UNIT/ML FlexTouch Pen Inject 15 Units into the skin daily.  . Insulin Pen Needle (BD PEN NEEDLE MICRO U/F) 32G X 6 MM MISC DX:E11.65 USE TO CHECK BLOOD SUGARS THREE TIMES A DAY  . Lancets Misc. MISC 1 each by Does not apply route 4 (four) times daily -  with meals and at bedtime.  .Marland Kitchenlinaclotide (LINZESS) 145 MCG CAPS capsule Take 1 capsule (145 mcg total) by mouth daily before breakfast.  . mirabegron ER (MYRBETRIQ) 50 MG TB24 tablet Take 1 tablet (50 mg total) by mouth daily.  . Multiple Vitamin (MULTIVITAMIN WITH MINERALS) TABS tablet Take 1 tablet by mouth daily.  . pantoprazole (PROTONIX) 40 MG tablet Take 1 tablet (40 mg total) by mouth daily.  . pravastatin (PRAVACHOL) 40 MG tablet TAKE ONE TABLET BY MOUTH  EVERYDAY AT BEDTIME (Patient taking differently: Take 40 mg by mouth daily.)  . pregabalin (LYRICA) 50 MG capsule Take 1 capsule (50 mg total) by mouth 3 (three) times daily. (Patient taking differently: Take 50 mg by mouth daily.)  . Vitamin D, Ergocalciferol, (DRISDOL) 1.25 MG (50000 UNIT) CAPS capsule TAKE ONE CAPSULE BY MOUTH ONCE WEEKLY ON WEDNESDAY  . HUMALOG 100 UNIT/ML injection Inject 10  Units into the skin daily. Adjust with sliding scale   No facility-administered encounter medications on file as of 01/11/2021.    Allergies (verified) Pollen extract, Tomato, and Codeine   History: Past Medical History:  Diagnosis Date  . Aortic stenosis    mild on 04/07/19 echo  . Arthritis    L knee, hands, back   . Asthma   . Diabetes mellitus type 2, insulin dependent ([place])   . Fibromyalgia   . Full dentures   . Full dentures   . GERD (gastroesophageal reflux disease)   . H/O hiatal hernia   . Headache    h/o migraines - was followed for a time with a wellness doctor  . History of esophageal dilatation   . History of rhabdomyolysis    03/ 2015  . Hyperlipidemia   . Hypertension   . Insulin pump in place    since 12/ 2014--  [facility]  . Lumbar disc herniation    right leg weakness  . Osteoarthritis of left knee, primary localized 08/17/2014  . 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  . Renal insufficiency   . Rhabdomyolysis 11/15/2013  . SUI (stress urinary incontinence, female)   . Syncope 12/27/2017  . Wears glasses   . Wears glasses    Past Surgical History:  Procedure Laterality Date  . BLADDER SUSPENSION N/A 03/09/2014   Procedure: CYSTOSCOPY/SLING;  Surgeon: Reece Packer, MD;  [facility];  Service: Urology;  Laterality: N/A;  . CARDIAC CATHETERIZATION  07-20-2009   DR Shelva Majestic   MODERATE LVH/  NORMAL  LVEF/  NORMAL CORONARY AND RENAL ARTERIES  . CARPAL TUNNEL RELEASE Right 2000  . CHOLECYSTECTOMY  1990  . COLONOSCOPY WITH ESOPHAGOGASTRODUODENOSCOPY (EGD)  06-18-2002  . ESOPHAGOGASTRODUODENOSCOPY (EGD) WITH ESOPHAGEAL DILATION  06-12-2010  . EXCISION LEFT UPPER ARM MASS  01-29-2014  . EYE SURGERY  2010   laser on R eye  . INTERSTIM IMPLANT REMOVAL N/A 03/06/2019   Procedure: REMOVAL OF INTERSTIM IMPLANT;  Surgeon: Bjorn Loser, MD;  Location: [facility];  Service: Urology;  Laterality: N/A;  . KNEE ARTHROSCOPY Right 1989  &  2002  .  LUMBAR LAMINECTOMY N/A 02/19/2020   Procedure: right L4 hemiaminectomy, microdiscectomy;  Surgeon: Marybelle Killings, MD;  Location: [place];  Service: Orthopedics;  Laterality: N/A;  . MULTIPLE TOOTH EXTRACTIONS    . NEGATIVE SLEEP STUDY  2012   PER PT  . PARTIAL KNEE ARTHROPLASTY Left 08/17/2014   Procedure: LEFT UNICOMPARTMENTAL KNEE;  Surgeon: Johnny Bridge, MD;  Location: [place];  Service: Orthopedics;  Laterality: Left;   Family History  Problem Relation Age of Onset  . Diabetes Mother   . Hypertension Mother   . Lung cancer Mother        smoked  . Diabetes Father   . Hypertension Father   . Lung cancer Father        smoked  . Diabetes Brother   . Heart failure Sister   . Diabetes Maternal Grandmother   . Cancer Maternal Grandmother    Social History   Socioeconomic History  .  Marital status: Divorced    Spouse name: Not on file  . Number of children: Not on file  . Years of education: Not on file  . Highest education level: Not on file  Occupational History  . Occupation: disability  Tobacco Use  . Smoking status: Never Smoker  . Smokeless tobacco: Never Used  Vaping Use  . Vaping Use: Never used  Substance and Sexual Activity  . Alcohol use: No  . Drug use: No  . Sexual activity: Not Currently  Other Topics Concern  . Not on file  Social History Narrative  . Not on file   Social Determinants of Health   Financial Resource Strain: Low Risk   . Difficulty of Paying Living Expenses: Not hard at all  Food Insecurity: No Food Insecurity  . Worried About Charity fundraiser in the Last Year: Never true  . Ran Out of Food in the Last Year: Never true  Transportation Needs: No Transportation Needs  . Lack of Transportation (Medical): No  . Lack of Transportation (Non-Medical): No  Physical Activity: Inactive  . Days of Exercise per Week: 0 days  . Minutes of Exercise per Session: 0 min  Stress: No Stress Concern Present  . Feeling of Stress : Not at all  Social  Connections: Not on file    Tobacco Counseling Counseling given: Not Answered   Clinical Intake:  Pre-visit preparation completed: Yes  Pain : 0-10 Pain Score: 7  Pain Type: Acute pain Pain Location: Leg Pain Orientation: Left Pain Descriptors / Indicators: Heaviness Pain Onset: More than a month ago Pain Frequency: Constant     Nutritional Status: BMI 25 -29 Overweight Nutritional Risks: None Diabetes: Yes  How often do you need to have someone help you when you read instructions, pamphlets, or other written materials from your doctor or pharmacy?: 1 - Never What is the last grade level you completed in school?: 12th grade  Diabetic? Yes Nutrition Risk Assessment:  Has the patient had any N/V/D within the last 2 months?  No  Does the patient have any non-healing wounds?  No  Has the patient had any unintentional weight loss or weight gain?  No   Diabetes:  Is the patient diabetic?  Yes  If diabetic, was a CBG obtained today?  No  Did the patient bring in their glucometer from home?  No  How often do you monitor your CBG's? At least 4 times daily.   Financial Strains and Diabetes Management:  Are you having any financial strains with the device, your supplies or your medication? No .  Does the patient want to be seen by Chronic Care Management for management of their diabetes?  No  Would the patient like to be referred to a Nutritionist or for Diabetic Management?  No   Diabetic Exams:  Diabetic Eye Exam: Completed 02/04/2020 Diabetic Foot Exam: Overdue, Pt has been advised about the importance in completing this exam. Pt is scheduled for diabetic foot exam on next appointment.   Interpreter Needed?: No  Information entered by :: NAllen LPN   Activities of Daily Living In your present state of health, do you have any difficulty performing the following activities: 01/11/2021 12/01/2020  Hearing? N N  Vision? N N  Difficulty concentrating or making  decisions? N N  Walking or climbing stairs? Y N  Dressing or bathing? N N  Doing errands, shopping? N -  Preparing Food and eating ? N -  Using the Toilet? N -  In the past six months, have you accidently leaked urine? Y -  Comment incontinence -  Do you have problems with loss of bowel control? N -  Managing your Medications? N -  Managing your Finances? N -  Housekeeping or managing your Housekeeping? N -  Some recent data might be hidden    Patient Care Team: [name], FNP as PCP - General (General Practice) Elayne Snare, MD as Consulting Physician (Endocrinology) Bjorn Loser, MD as Consulting Physician (Urology) Juanita Craver, MD as Consulting Physician (Gastroenterology) Rex Kras, Claudette Stapler, RN as Case Manager Mayford Knife, [facility] (Pharmacist)  Indicate any recent Medical Services you may have received from other than [facility] providers in the past year (date may be approximate).     Assessment:   This is a routine wellness examination for [name].  Hearing/Vision screen No exam data present  Dietary issues and exercise activities discussed: Current Exercise Habits: The patient does not participate in regular exercise at present  Goals Addressed            This Visit's Progress   . Patient Stated       01/11/2021, stay independent      Depression Screen PHQ 2/9 Scores 01/11/2021 12/31/2019 10/22/2019 09/03/2019 04/14/2019 03/24/2019 11/20/2018  PHQ - 2 Score 0 0 0 0 0 0 0  PHQ- 9 Score - 0 - - - - 3    Fall Risk Fall Risk  01/11/2021 12/31/2019 10/22/2019 09/03/2019 07/22/2019  Falls in the past year? 1 0 0 0 0  Comment passed out - - - -  Number falls in past yr: 0 - - - -  Injury with Fall? 0 - - - -  Risk for fall due to : Impaired mobility;Medication side effect Medication side effect - - -  Follow up Falls evaluation completed;Education provided;Falls prevention discussed Falls evaluation completed;Education provided;Falls prevention discussed - - -    FALL  RISK PREVENTION PERTAINING TO THE HOME:  Any stairs in or around the home? Yes  If so, are there any without handrails? No  Home free of loose throw rugs in walkways, pet beds, electrical cords, etc? Yes  Adequate lighting in your home to reduce risk of falls? Yes   ASSISTIVE DEVICES UTILIZED TO PREVENT FALLS:  Life alert? No  Use of a cane, walker or w/c? Yes  Grab bars in the bathroom? Yes  Shower chair or bench in shower? Yes  Elevated toilet seat or a handicapped toilet? Yes   TIMED UP AND GO:  Was the test performed? No .   Cognitive Function:     6CIT Screen 01/11/2021 12/31/2019 11/20/2018  What Year? 0 points 0 points 0 points  What month? 0 points 0 points 0 points  What time? 0 points 0 points 0 points  Count back from 20 0 points 0 points 0 points  Months in reverse 0 points 0 points 0 points  Repeat phrase 0 points 2 points 0 points  Total Score 0 2 0    Immunizations Immunization History  Administered Date(s) Administered  . Fluad Quad(high Dose 65+) 05/03/2020  . Influenza Whole 06/27/2009, 06/13/2010  . Influenza, High Dose Seasonal PF 07/22/2019  . Influenza,inj,Quad PF,6+ Mos 06/03/2017  . Influenza-Unspecified 03/27/2013, 03/27/2014, 05/18/2015  . PFIZER(Purple Top)SARS-COV-2 Vaccination 09/21/2019, 10/02/2019, 05/24/2020  . Pneumococcal Conjugate-13 07/24/2017  . Pneumococcal Polysaccharide-23 08/27/2006, 11/29/2009, 03/24/2019  . Td 10/18/2009  . Tdap 12/27/2017    TDAP status: Up to date  Flu Vaccine status: Up  to date  Pneumococcal vaccine status: Up to date  Covid-19 vaccine status: Completed vaccines  Qualifies for Shingles Vaccine? Yes   Zostavax completed Yes   Shingrix Completed?: No.    Education has been provided regarding the importance of this vaccine. Patient has been advised to call insurance company to determine out of pocket expense if they have not yet received this vaccine. Advised may also receive vaccine at local pharmacy or  Health Dept. Verbalized acceptance and understanding.  Screening Tests Health Maintenance  Topic Date Due  . OPHTHALMOLOGY EXAM  Never done  . FOOT EXAM  07/21/2020  . INFLUENZA VACCINE  03/27/2021  . HEMOGLOBIN A1C  05/31/2021  . MAMMOGRAM  07/09/2021  . URINE MICROALBUMIN  12/13/2021  . COLONOSCOPY (Pts 45-29yr Insurance coverage will need to be confirmed)  12/13/2026  . TETANUS/TDAP  12/28/2027  . DEXA SCAN  Completed  . COVID-19 Vaccine  Completed  . Hepatitis C Screening  Completed  . PNA vac Low Risk Adult  Completed  . HPV VACCINES  Aged Out    Health Maintenance  Health Maintenance Due  Topic Date Due  . OPHTHALMOLOGY EXAM  Never done  . FOOT EXAM  07/21/2020    Colorectal cancer screening: Type of screening: Colonoscopy. Completed 12/12/2016. Repeat every 10 years  Mammogram status: patient to schedule  Bone Density status: Completed 07/10/2019.   Lung Cancer Screening: (Low Dose CT Chest recommended if Age 69-80years, 30 pack-year currently smoking OR have quit w/in 15years.) does not qualify.   Lung Cancer Screening Referral: no  Additional Screening:  Hepatitis C Screening: does qualify; Completed 09/23/2015  Vision Screening: Recommended annual ophthalmology exams for early detection of glaucoma and other disorders of the eye. Is the patient up to date with their annual eye exam?  Yes  Who is the provider or what is the name of the office in which the patient attends annual eye exams? [facility] If pt is not established with a provider, would they like to be referred to a provider to establish care? No .   Dental Screening: Recommended annual dental exams for proper oral hygiene  Community Resource Referral / Chronic Care Management: CRR required this visit?  No   CCM required this visit?  No      Plan:     I have personally reviewed and noted the following in the patient's chart:   . Medical and social history . Use of alcohol, tobacco or  illicit drugs  . Current medications and supplements including opioid prescriptions.  . Functional ability and status . Nutritional status . Physical activity . Advanced directives . List of other physicians . Hospitalizations, surgeries, and ER visits in previous 12 months . Vitals . Screenings to include cognitive, depression, and falls . Referrals and appointments  In addition, I have reviewed and discussed with patient certain preventive protocols, quality metrics, and best practice recommendations. A written personalized care plan for preventive services as well as general preventive health recommendations were provided to patient.     NKellie Simmering LPN   [id number]  Nurse Notes:

## 2021-01-12 ENCOUNTER — Telehealth: Payer: Self-pay

## 2021-01-25 ENCOUNTER — Ambulatory Visit (INDEPENDENT_AMBULATORY_CARE_PROVIDER_SITE_OTHER): Payer: Medicare Other | Admitting: Orthopaedic Surgery

## 2021-01-25 ENCOUNTER — Encounter: Payer: Self-pay | Admitting: Orthopaedic Surgery

## 2021-01-25 VITALS — BP 180/76 | HR 99 | Ht 64.0 in | Wt 146.0 lb

## 2021-01-25 DIAGNOSIS — M79605 Pain in left leg: Secondary | ICD-10-CM

## 2021-01-25 NOTE — Progress Notes (Addendum)
Office Visit Note   Patient: [name]           Date of Birth: 30-Jan-1951           MRN: [id number] Visit Date: 01/25/2021              Requested by: [name], [place] [address] [place] [address] [place],  [place] [id number] PCP: [name], FNP   Assessment & Plan: Visit Diagnoses: No diagnosis found.  Plan: We will set patient up for some physical therapy for left hip strengthening.  Reviewed last year's MRI and she did not have any areas of left side compression in the upper lumbar region.  No trauma history to suggest iliopsoas strain or tendinopathy.  She is not on any blood thinners.  We will set patient up for some physical therapy for left hip strengthening and she can return in 1 month for follow-up.  Follow-Up Instructions: No follow-ups on file.   Orders:  No orders of the defined types were placed in this encounter.  No orders of the defined types were placed in this encounter.     Procedures: No procedures performed   Clinical Data: No additional findings.   Subjective: Chief Complaint  Patient presents with  . Left Leg - Pain    02/19/2020 Right L4-5 microdiscectomy    HPI 70 year old female returns post right microdiscectomy L4-5.  Patient has had problems with left hip flexion having to reach and lift her leg in and out of a vehicle.  She is able to flex at the hip but feels like it is very weak certainly weaker than the opposite right side.  Preop back and right leg pain has been relieved with the surgery last year.  She has had some pain that sometimes feels like it she is toward her tailbone.  She has used Advil heat topical rubs also Lyrica with some improvement.  She does have stage III kidney disease in the past as well as hypertension.  Positive for diabetes.  Review of Systems all the systems noncontributory to HPI.   Objective: Vital Signs: BP (!) 180/76   Pulse 99   Ht 5\' 4"  (1.626 m)   Wt 146 lb (66.2 kg)   LMP  (LMP Unknown)    BMI 25.06 kg/m   Physical Exam Constitutional:      Appearance: She is well-developed.  HENT:     Head: Normocephalic.     Right Ear: External ear normal.     Left Ear: External ear normal.  Eyes:     Pupils: Pupils are equal, round, and reactive to light.  Neck:     Thyroid: No thyromegaly.     Trachea: No tracheal deviation.  Cardiovascular:     Rate and Rhythm: Normal rate.  Pulmonary:     Effort: Pulmonary effort is normal.  Abdominal:     Palpations: Abdomen is soft.  Skin:    General: Skin is warm and dry.  Neurological:     Mental Status: She is alert and oriented to person, place, and time.  Psychiatric:        Behavior: Behavior normal.     Ortho Exam well-healed lumbar incision from right L4 hemilaminectomy and microdiscectomy.  No static notch tenderness right or left side mild trochanteric bursal tenderness.  Weakness with left hip flexor on the left.  Sensory testing over the anterior thigh is normal.  Anterior tib gastrocsoleus is strong both right and left.  Negative logroll of  the hips.  Quads are strong right and left.  Left hip flexor is one half grade weak.  Specialty Comments:  No specialty comments available.  Imaging: No results found.   PMFS History: Patient Active Problem List   Diagnosis Date Noted  . Syncope 11/29/2020  . Mild aortic stenosis 11/29/2020  . Irritable bowel syndrome with diarrhea 10/10/2020  . Gastroesophageal reflux disease with esophagitis 10/10/2020  . Postlaminectomy syndrome, not elsewhere classified 07/03/2020  . S/P lumbar laminectomy 03/30/2020  . Localized swelling, mass and lump, multiple sites 03/27/2018  . Type II diabetes mellitus with renal manifestations ([place]) 12/27/2017  . HLD (hyperlipidemia) 12/27/2017  . HTN (hypertension) 12/27/2017  . CKD (chronic kidney disease), stage III ([place]) 12/27/2017  . Cough variant asthma 12/01/2015  . Osteoarthritis of left knee, primary localized 08/17/2014  . Knee  osteoarthritis 08/17/2014  . Hypoglycemia 11/15/2013  . Other and unspecified hyperlipidemia 08/06/2013  . Type II diabetes mellitus, uncontrolled ([place]) 07/20/2013  . Varicose veins of lower extremities with other complications 50/27/7412  . Fibromyalgia   . GERD 05/18/2010  . ABDOMINAL PAIN, GENERALIZED 05/02/2010  . OBESITY 04/20/2010  . BURSITIS, LEFT SHOULDER 03/09/2010  . Lipoma of arm s/p excision 01/29/2014 01/10/2010  . DEPRESSION 12/27/2009  . BACK PAIN WITH RADICULOPATHY 12/27/2009  . CYSTITIS, ACUTE 10/21/2009  . MICROSCOPIC HEMATURIA 10/18/2009  . KNEE PAIN, BILATERAL 10/18/2009  . NEUROPATHY 09/08/2009  . Asthma 09/08/2009  . STRESS INCONTINENCE 09/08/2009  . CHEST PAIN 07/19/2009  . ESOPHAGEAL STRICTURE 08/27/2005   Past Medical History:  Diagnosis Date  . Aortic stenosis    mild on 04/07/19 echo  . Arthritis    L knee, hands, back   . Asthma   . Diabetes mellitus type 2, insulin dependent ([place])   . Fibromyalgia   . Full dentures   . Full dentures   . GERD (gastroesophageal reflux disease)   . H/O hiatal hernia   . Headache    h/o migraines - was followed for a time with a wellness doctor  . History of esophageal dilatation   . History of rhabdomyolysis    03/ 2015  . Hyperlipidemia   . Hypertension   . Insulin pump in place    since 12/ 2014--  [facility]  . Lumbar disc herniation    right leg weakness  . Osteoarthritis of left knee, primary localized 08/17/2014  . 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  . Renal insufficiency   . Rhabdomyolysis 11/15/2013  . SUI (stress urinary incontinence, female)   . Syncope 12/27/2017  . Wears glasses   . Wears glasses     Family History  Problem Relation Age of Onset  . Diabetes Mother   . Hypertension Mother   . Lung cancer Mother        smoked  . Diabetes Father   . Hypertension Father   . Lung cancer Father        smoked  . Diabetes Brother   . Heart failure Sister   . Diabetes Maternal Grandmother   . Cancer  Maternal Grandmother     Past Surgical History:  Procedure Laterality Date  . BLADDER SUSPENSION N/A 03/09/2014   Procedure: CYSTOSCOPY/SLING;  Surgeon: Reece Packer, MD;  Location: [facility];  Service: Urology;  Laterality: N/A;  . CARDIAC CATHETERIZATION  07-20-2009   DR Shelva Majestic   MODERATE LVH/  NORMAL  LVEF/  NORMAL CORONARY AND RENAL ARTERIES  . CARPAL TUNNEL RELEASE Right 2000  . CHOLECYSTECTOMY  1990  .  COLONOSCOPY WITH ESOPHAGOGASTRODUODENOSCOPY (EGD)  06-18-2002  . ESOPHAGOGASTRODUODENOSCOPY (EGD) WITH ESOPHAGEAL DILATION  06-12-2010  . EXCISION LEFT UPPER ARM MASS  01-29-2014  . EYE SURGERY  2010   laser on R eye  . INTERSTIM IMPLANT REMOVAL N/A 03/06/2019   Procedure: REMOVAL OF INTERSTIM IMPLANT;  Surgeon: Bjorn Loser, MD;  Location: [facility];  Service: Urology;  Laterality: N/A;  . KNEE ARTHROSCOPY Right 1989  &  2002  . LUMBAR LAMINECTOMY N/A 02/19/2020   Procedure: right L4 hemiaminectomy, microdiscectomy;  Surgeon: Marybelle Killings, MD;  Location: [facility];  Service: Orthopedics;  Laterality: N/A;  . MULTIPLE TOOTH EXTRACTIONS    . NEGATIVE SLEEP STUDY  2012   PER PT  . PARTIAL KNEE ARTHROPLASTY Left 08/17/2014   Procedure: LEFT UNICOMPARTMENTAL KNEE;  Surgeon: Johnny Bridge, MD;  Location: [place];  Service: Orthopedics;  Laterality: Left;   Social History   Occupational History  . Occupation: disability  Tobacco Use  . Smoking status: Never Smoker  . Smokeless tobacco: Never Used  Vaping Use  . Vaping Use: Never used  Substance and Sexual Activity  . Alcohol use: No  . Drug use: No  . Sexual activity: Not Currently

## 2021-01-26 ENCOUNTER — Telehealth: Payer: Self-pay

## 2021-01-26 NOTE — Chronic Care Management (AMB) (Addendum)
Chronic Care Management Pharmacy Assistant   Name: [name]  MRN: [id number] DOB: 1951-02-08   Reason for Encounter: Medication Review/ Medication Coordination    Recent office visits:  01-02-2021 [name], RN (CCM)  01-11-2021 [name], LPN   Recent consult visits:  01-25-2021 Marybelle Killings, MD (Orthopedic surgery)   Hospital visits:  None in previous 6 months  Medications: Outpatient Encounter Medications as of 01/26/2021  Medication Sig  . amLODipine (NORVASC) 5 MG tablet Take 1 tablet (5 mg total) by mouth at bedtime.  [name] aspirin EC 81 MG EC tablet Take 1 tablet (81 mg total) by mouth daily. Swallow whole.  . blood glucose meter kit and supplies KIT Dispense based on patient and insurance preference. Check blood sugars 4 times daily E11.69  . Continuous Blood Gluc Receiver (FREESTYLE LIBRE READER) DEVI 1 each by Does not apply route See admin instructions. CGM device-Freestyle Libre  . Continuous Blood Gluc Sensor (FREESTYLE LIBRE 14 DAY SENSOR) MISC 1 each by Does not apply route See admin instructions. CGM 14-day sensors; send refills to [facility] (in [place])  . FARXIGA 10 MG TABS tablet TAKE ONE TABLET BY MOUTH BEFORE BREAKFAST (Patient taking differently: Take 10 mg by mouth daily.)  . HUMALOG 100 UNIT/ML injection Inject 10 Units into the skin daily. Adjust with sliding scale  . insulin degludec (TRESIBA FLEXTOUCH) 100 UNIT/ML FlexTouch Pen Inject 15 Units into the skin daily.  . Insulin Pen Needle (BD PEN NEEDLE MICRO U/F) 32G X 6 MM MISC DX:E11.65 USE TO CHECK BLOOD SUGARS THREE TIMES A DAY  . Lancets Misc. MISC 1 each by Does not apply route 4 (four) times daily -  with meals and at bedtime.  [name] linaclotide (LINZESS) 145 MCG CAPS capsule Take 1 capsule (145 mcg total) by mouth daily before breakfast.  . mirabegron ER (MYRBETRIQ) 50 MG TB24 tablet Take 1 tablet (50 mg total) by mouth daily.  . Multiple Vitamin (MULTIVITAMIN WITH MINERALS)  TABS tablet Take 1 tablet by mouth daily.  . pantoprazole (PROTONIX) 40 MG tablet Take 1 tablet (40 mg total) by mouth daily.  . pravastatin (PRAVACHOL) 40 MG tablet TAKE ONE TABLET BY MOUTH EVERYDAY AT BEDTIME (Patient taking differently: Take 40 mg by mouth daily.)  . pregabalin (LYRICA) 50 MG capsule Take 1 capsule (50 mg total) by mouth 3 (three) times daily. (Patient taking differently: Take 50 mg by mouth daily.)  . spironolactone (ALDACTONE) 50 MG tablet Take 50 mg by mouth every morning.  . Vitamin D, Ergocalciferol, (DRISDOL) 1.25 MG (50000 UNIT) CAPS capsule TAKE ONE CAPSULE BY MOUTH ONCE WEEKLY ON WEDNESDAY   No facility-administered encounter medications on file as of 01/26/2021.   Reviewed chart for medication changes ahead of medication coordination call.  No OVs, Consults, or hospital visits since last care coordination call/Pharmacist visit. (If appropriate, list visit date, provider name)  No medication changes indicated OR if recent visit, treatment plan here.  BP Readings from Last 3 Encounters:  01/25/21 (!) 180/76  12/13/20 124/78  12/01/20 133/74    Lab Results  Component Value Date   HGBA1C 10.4 (H) 11/29/2020     Patient obtains medications through Adherence Packaging  30 Days   Last adherence delivery included:   Women's 50 Plus Multivitamin- 1 tablet daily(breakfast)  Myrbetriq 50 mg- 1 tablet daily(breakfast)  Pravastatin 40 mg- 1/2 tablet daily(bedtime)  Ergocalciferol 50,000 units- 1 capsule weekly on Wednesdays (breakfast)  Farxiga 10 gm- 1 tablet  daily (before breakfast)  Pravastatin 40 mg- 1 tablet daily (bedtime)  Linzess 145 mcg- 1 tablet daily(before breakfast)  Amlodipine 5 mg- 1 tablet daily (bedtime)  Pantoprazole 40 mg- 1 tablet daily (breakfast)  Lyrica 50 mg- 1 tablet three times a day (breakfast, lunch, bedtime)   Patient declined (meds) last month:  Tresiba FlexTouch- Patient is now taking 15 units into skin daily, has 2  pens left.  Ozempic 1 mg- Inject 81m into skin once weekly- on hold until patient is able to check blood sugar readings  Humalog Kwikpen- Patient is now taking 10 units three times a day and using as a sliding scale, patient has 2 pens left.   Amitiza 8 mg- 1 capsule dailydue to being discontinued, no insurance coverage, replaced with Linzess.   Gabapentin 300 mg- 1 capsule twice daily due to discontinuing medication. Patient states she doesn't feel like medication was helping so she stopped taking.   Novofine 32G x 616mneedles due to having an adequate supply.   Methocarbamol 750 mg- due to being discontinued.   Patient is due for next adherence delivery on: 02-01-2021.  Called patient and reviewed medications and coordinated delivery.  This delivery to include:  Women's 50 Plus Multivitamin- 1 tablet daily(breakfast)  Myrbetriq 50 mg- 1 tablet daily(breakfast)  Ergocalciferol 50,000 units- 1 capsule weekly on Wednesdays (breakfast)  Farxiga 10 gm- 1 tablet daily (before breakfast)  Pravastatin 40 mg- 1 tablet daily (bedtime)  Linzess 145 mcg- 1 tablet daily(before breakfast)  Amlodipine 5 mg- 1 tablet daily (bedtime)  Pantoprazole 40 mg- 1 tablet daily (breakfast)  Lyrica 50 mg- 1 tablet three times a day (breakfast, lunch, bedtime)  Humalog Kwik Injection pen 25 units  Verio test strips   Patient declined the following medications (meds) due to (reason): Spironolactone 50 mg- Medication discontinued  Patient needs refills for: Myrebetriq 50 MG. Pharmacist PaPete Peltill have to request refills from Dr. ScNicki Reaperacdiarmid.  No acute or short fills needed  NOTES: Patient is requesting humalog Kwik pen instead of regular injection.   Confirmed delivery date of 02-01-2021, advised patient that pharmacy will contact them the morning of delivery.  Star Rating Drugs: Pravastatin 40 MG- Last filled 01-03-2021 30 [facility] Ozempic 1 MG- Last filled  09-05-2020 [facility] Farxiga 10 MG- Last filled 01-03-2021 30 [facility]   [facility] Pharmacist Assistant [phone]

## 2021-01-26 NOTE — Telephone Encounter (Signed)
-----   Message from [name], [place] sent at 01/26/2021  3:51 PM EDT ----- Can you check to see if she has a blood pressure cuff at home, if so she needs to check her blood pressure daily once a day after taking her medications at least an hour later. And call the office with her readings next Monday when I return, she may need to be seen earlier  ----- Message ----- From: Marybelle Killings, MD Sent: 01/26/2021   2:48 PM EDT To: [name], FNP

## 2021-01-26 NOTE — Telephone Encounter (Signed)
Patient advised to check blood pressure an hour after taking medications each day and record her readings and send them on mondays .

## 2021-02-02 ENCOUNTER — Ambulatory Visit (INDEPENDENT_AMBULATORY_CARE_PROVIDER_SITE_OTHER): Payer: Medicare Other

## 2021-02-02 DIAGNOSIS — E1165 Type 2 diabetes mellitus with hyperglycemia: Secondary | ICD-10-CM

## 2021-02-02 DIAGNOSIS — I10 Essential (primary) hypertension: Secondary | ICD-10-CM | POA: Diagnosis not present

## 2021-02-02 DIAGNOSIS — E782 Mixed hyperlipidemia: Secondary | ICD-10-CM

## 2021-02-02 NOTE — Progress Notes (Signed)
Chronic Care Management Pharmacy Note  02/23/2021 Name:  ZEAH GERMANO MRN:  [id number] DOB:  [birth year]  Summary: Patient has started using her CGM, and her medications are received through a pill packaging system.   Recommendations/Changes made from today's visit: Recommend patient have lipid panel completed.  Recommend patient continue to decrease the amount of fried fatty foods in her diet.  Recommend patient receive second booster for COVID-19   Plan: Patient to have lipid panel completed at next appointment Patient to start keeping a food journal  Start  checking BP at least 4 times per week and log results.   Subjective: MENNIE SPILLER is an 70 y.o. year old female who is a primary patient of [name], [place].  The CCM team was consulted for assistance with disease management and care coordination needs.    Engaged with patient by telephone for follow up visit in response to provider referral for pharmacy case management and/or care coordination services.   Consent to Services:  The patient was given information about Chronic Care Management services, agreed to services, and gave verbal consent prior to initiation of services.  Please see initial visit note for detailed documentation.   Patient Care Team: [name], FNP as PCP - General (General Practice) Elayne Snare, MD as Consulting Physician (Endocrinology) Bjorn Loser, MD as Consulting Physician (Urology) Juanita Craver, MD as Consulting Physician (Gastroenterology) Rex Kras Claudette Stapler, RN as Case Manager Mayford Knife, [facility] (Pharmacist)  Recent office visits: 12/13/2020 PCP office visit  Recent consult visits: 01/25/2021 Ortho consult  Hospital visits: 11/29/2020   Objective:  Lab Results  Component Value Date   CREATININE 1.22 (H) 12/13/2020   BUN 8 12/13/2020   GFR 51.07 (L) 05/17/2017   GFRNONAA 59 (L) 12/01/2020   GFRAA 57 (L) 07/07/2020   NA 143 12/13/2020   K 4.2 12/13/2020    CALCIUM 9.8 12/13/2020   CO2 24 12/13/2020   GLUCOSE 224 (H) 12/13/2020    Lab Results  Component Value Date/Time   HGBA1C 10.4 (H) 11/29/2020 09:47 PM   HGBA1C 10.5 (H) 07/07/2020 05:11 PM   FRUCTOSAMINE 388 (H) 05/17/2017 08:54 AM   FRUCTOSAMINE 286 (H) 07/20/2014 10:07 AM   GFR 51.07 (L) 05/17/2017 08:54 AM   GFR 64.19 02/20/2017 11:52 AM   MICROALBUR 80 12/13/2020 05:01 PM   MICROALBUR 80 12/31/2019 03:19 PM    Last diabetic Eye exam: No results found for: HMDIABEYEEXA  Last diabetic Foot exam: No results found for: HMDIABFOOTEX   Lab Results  Component Value Date   CHOL 168 12/31/2019   HDL 54 12/31/2019   LDLCALC 98 12/31/2019   LDLDIRECT 152.8 10/09/2013   TRIG 88 12/31/2019   CHOLHDL 3.1 12/31/2019    Hepatic Function Latest Ref Rng & Units 07/07/2020 05/03/2020 02/19/2020  Total Protein 6.0 - 8.5 g/dL 7.0 7.1 7.1  Albumin 3.8 - 4.8 g/dL 4.2 4.0 3.5  AST 0 - 40 IU/L 19 12 17   ALT 0 - 32 IU/L 17 11 19   Alk Phosphatase 44 - 121 IU/L 107 130(H) 94  Total Bilirubin 0.0 - 1.2 mg/dL 0.3 0.7 1.5(H)  Bilirubin, Direct 0.00 - 0.40 mg/dL - - -    Lab Results  Component Value Date/Time   TSH 1.990 06/09/2018 12:42 PM   TSH 2.047 05/31/2010 11:11 PM    CBC Latest Ref Rng & Units 12/13/2020 12/01/2020 11/30/2020  WBC 3.4 - 10.8 x10E3/uL 5.7 4.3 8.8  Hemoglobin 11.1 - 15.9 g/dL 15.1 13.9 16.0(H)  Hematocrit 34.0 - 46.6 % 46.5 42.7 49.4(H)  Platelets 150 - 450 x10E3/uL 289 238 255    Lab Results  Component Value Date/Time   VD25OH 34.4 05/03/2020 02:55 PM   VD25OH 41.4 12/31/2019 04:11 PM    Clinical ASCVD: No  The 10-year ASCVD risk score Mikey Bussing DC Jr., et al., 2013) is: 29.9%   Values used to calculate the score:     Age: 35 years     Sex: Female     Is Non-Hispanic African American: Yes     Diabetic: Yes     Tobacco smoker: No     Systolic Blood Pressure: 737 mmHg     Is BP treated: Yes     HDL Cholesterol: 54 mg/dL     Total Cholesterol: 168 mg/dL     Depression screen [facility] 2/9 01/11/2021 12/31/2019 10/22/2019  Decreased Interest 0 0 0  Down, Depressed, Hopeless 0 0 0  PHQ - 2 Score 0 0 0  Altered sleeping - 0 -  Tired, decreased energy - 0 -  Change in appetite - 0 -  Feeling bad or failure about yourself  - 0 -  Trouble concentrating - 0 -  Moving slowly or fidgety/restless - 0 -  Suicidal thoughts - 0 -  PHQ-9 Score - 0 -  Difficult doing work/chores - Not difficult at all -  Some recent data might be hidden     Social History   Tobacco Use  Smoking Status Never  Smokeless Tobacco Never   BP Readings from Last 3 Encounters:  02/06/21 (!) 156/81  01/25/21 (!) 180/76  12/13/20 124/78   Pulse Readings from Last 3 Encounters:  02/06/21 88  01/25/21 99  12/13/20 87   Wt Readings from Last 3 Encounters:  02/06/21 146 lb (66.2 kg)  01/25/21 146 lb (66.2 kg)  01/11/21 146 lb (66.2 kg)   BMI Readings from Last 3 Encounters:  02/06/21 25.06 kg/m  01/25/21 25.06 kg/m  01/11/21 25.06 kg/m    Assessment/Interventions: Review of patient past medical history, allergies, medications, health status, including review of consultants reports, laboratory and other test data, was performed as part of comprehensive evaluation and provision of chronic care management services.   SDOH:  (Social Determinants of Health) assessments and interventions performed: No  SDOH Screenings   Alcohol Screen: Not on file  Depression (PHQ2-9): Low Risk    PHQ-2 Score: 0  Financial Resource Strain: Low Risk    Difficulty of Paying Living Expenses: Not hard at all  Food Insecurity: No Food Insecurity   Worried About Charity fundraiser in the Last Year: Never true   Ran Out of Food in the Last Year: Never true  Housing: Not on file  Physical Activity: Inactive   Days of Exercise per Week: 0 days   Minutes of Exercise per Session: 0 min  Social Connections: Not on file  Stress: No Stress Concern Present   Feeling of Stress : Not at all   Tobacco Use: Low Risk    Smoking Tobacco Use: Never   Smokeless Tobacco Use: Never  Transportation Needs: No Transportation Needs   Lack of Transportation (Medical): No   Lack of Transportation (Non-Medical): No    CCM Care Plan  Allergies  Allergen Reactions   Pollen Extract Other (See Comments)    Congestion/sneezing   Tomato Hives and Itching   Codeine Hives, Itching and Nausea And Vomiting    Medications Reviewed Today     Reviewed by  Carmine Savoy, RT Geographical information systems officer) on 02/06/21 at 7  Med List Status: <None>   Medication Order Taking? Sig Documenting Provider Last Dose Status Informant  amLODipine (NORVASC) 5 MG tablet [id number] Yes Take 1 tablet (5 mg total) by mouth at bedtime. Elmarie Shiley, MD Taking Active   aspirin EC 81 MG EC tablet [id number] Yes Take 1 tablet (81 mg total) by mouth daily. Swallow whole. Elmarie Shiley, MD Taking Active   blood glucose meter kit and supplies KIT [id number] Yes Dispense based on patient and insurance preference. Check blood sugars 4 times daily E11.69 [name], FNP Taking Active   Continuous Blood Gluc Receiver (FREESTYLE LIBRE READER) DEVI [id number] Yes 1 each by Does not apply route See admin instructions. CGM device-Freestyle Elenor Legato [provider] Taking Active Self  Continuous Blood Gluc Sensor (FREESTYLE LIBRE [place]) [place] [id number] Yes 1 each by Does not apply route See admin instructions. CGM 14-day sensors; send refills to [facility] (in [place]) [provider] Taking Active Self  FARXIGA 10 MG TABS tablet [id number] Yes TAKE ONE TABLET BY MOUTH BEFORE BREAKFAST  Patient taking differently: Take 10 mg by mouth daily.   [name], FNP Taking Active   HUMALOG 100 UNIT/ML injection [id number] Yes Inject 10 Units into the skin daily. Adjust with sliding scale [provider] Taking Active   insulin degludec (TRESIBA FLEXTOUCH) 100 UNIT/ML FlexTouch Pen [id number] Yes Inject 15  Units into the skin daily. Regalado, Belkys A, MD Taking Active   Insulin Pen Needle (BD PEN NEEDLE MICRO U/F) 32G X 6 MM MISC [id number] Yes DX:E11.65 USE TO CHECK BLOOD SUGARS THREE TIMES A Lynnell Dike, MD Taking Active Self  Lancets Misc. Qulin [id number] Yes 1 each by Does not apply route 4 (four) times daily -  with meals and at bedtime. [name], FNP Taking Active Self  linaclotide Rolan Lipa) 145 MCG CAPS capsule [id number] Yes Take 1 capsule (145 mcg total) by mouth daily before breakfast. [name], FNP Taking Active   mirabegron ER (MYRBETRIQ) 50 MG TB24 tablet [id number] Yes Take 1 tablet (50 mg total) by mouth daily. [name], FNP Taking Active Self  Multiple Vitamin (MULTIVITAMIN WITH MINERALS) TABS tablet [id number] Yes Take 1 tablet by mouth daily. [provider] Taking Active Self  pantoprazole (PROTONIX) 40 MG tablet [id number] Yes Take 1 tablet (40 mg total) by mouth daily. [name], FNP Taking Active   pravastatin (PRAVACHOL) 40 MG tablet [id number] Yes TAKE ONE TABLET BY MOUTH EVERYDAY AT BEDTIME  Patient taking differently: Take 40 mg by mouth daily.   [name], FNP Taking Active   pregabalin (LYRICA) 50 MG capsule [id number] Yes Take 1 capsule (50 mg total) by mouth 3 (three) times daily.  Patient taking differently: Take 50 mg by mouth daily.   [name], FNP Taking Active            Med Note Broadus John, REGEENA   Tue Nov 29, 2020 10:28 PM)    spironolactone (ALDACTONE) 50 MG tablet [id number] Yes Take 50 mg by mouth every morning. [provider] Taking Active   Vitamin D, Ergocalciferol, (DRISDOL) 1.25 MG (50000 UNIT) CAPS capsule [id number] Yes TAKE ONE CAPSULE BY MOUTH ONCE WEEKLY ON Marlana Latus [name], FNP Taking Active             Patient Active Problem List   Diagnosis Date Noted   Quadriceps weakness 02/06/2021   Syncope 11/29/2020   Mild aortic stenosis 11/29/2020   Irritable  bowel syndrome with diarrhea 10/10/2020    Gastroesophageal reflux disease with esophagitis 10/10/2020   Postlaminectomy syndrome, not elsewhere classified 07/03/2020   S/P lumbar laminectomy 03/30/2020   Localized swelling, mass and lump, multiple sites 03/27/2018   Type II diabetes mellitus with renal manifestations ([place]) 12/27/2017   HLD (hyperlipidemia) 12/27/2017   HTN (hypertension) 12/27/2017   CKD (chronic kidney disease), stage III ([place]) 12/27/2017   Cough variant asthma 12/01/2015   Osteoarthritis of left knee, primary localized 08/17/2014   Knee osteoarthritis 08/17/2014   Hypoglycemia 11/15/2013   Other and unspecified hyperlipidemia 08/06/2013   Type II diabetes mellitus, uncontrolled ([place]) 07/20/2013   Varicose veins of lower extremities with other complications 43/15/4008   Fibromyalgia    GERD 05/18/2010   ABDOMINAL PAIN, GENERALIZED 05/02/2010   OBESITY 04/20/2010   BURSITIS, LEFT SHOULDER 03/09/2010   Lipoma of arm s/p excision 01/29/2014 01/10/2010   DEPRESSION 12/27/2009   BACK PAIN WITH RADICULOPATHY 12/27/2009   CYSTITIS, ACUTE 10/21/2009   MICROSCOPIC HEMATURIA 10/18/2009   KNEE PAIN, BILATERAL 10/18/2009   NEUROPATHY 09/08/2009   Asthma 09/08/2009   STRESS INCONTINENCE 09/08/2009   CHEST PAIN 07/19/2009   ESOPHAGEAL STRICTURE 08/27/2005    Immunization History  Administered Date(s) Administered   Fluad Quad(high Dose 65+) 05/03/2020   Influenza Whole 06/27/2009, 06/13/2010   Influenza, High Dose Seasonal PF 07/22/2019   Influenza,inj,Quad PF,6+ Mos 06/03/2017   Influenza-Unspecified 03/27/2013, 03/27/2014, 05/18/2015   PFIZER(Purple Top)SARS-COV-2 Vaccination 09/21/2019, 10/02/2019, 05/24/2020   Pneumococcal Conjugate-13 07/24/2017   Pneumococcal Polysaccharide-23 08/27/2006, 11/29/2009, 03/24/2019   Td 10/18/2009   Tdap 12/27/2017    Conditions to be addressed/monitored:  Hypertension, Hyperlipidemia, and Diabetes  Care Plan : [place]  Updates made by Mayford Knife, [place] since 02/23/2021 12:00 AM     Problem: HTN, HLD, DM II   Priority: High     Long-Range Goal: Disease Management   Note:     Current Barriers:  Unable to independently monitor therapeutic efficacy  Pharmacist Clinical Goal(s):  Patient will achieve adherence to monitoring guidelines and medication adherence to achieve therapeutic efficacy through collaboration with PharmD and provider.   Interventions: 1:1 collaboration with [name], FNP regarding development and update of comprehensive plan of care as evidenced by provider attestation and co-signature Inter-disciplinary care team collaboration (see longitudinal plan of care) Comprehensive medication review performed; medication list updated in electronic medical record  Hypertension (BP goal <130/80) -Uncontrolled -Current treatment: Amlodipine 5 mg taking 1 tablet by mouth daily Spironolactone 50 mg tablet once per day in the morning  Stopped when she went to [place] for syncope  -Current home readings: 130/84  Pulse: 80, 159/84 Pulse-79, 138/80 pulse: 79, 141/73 pulse: 88, 134/83 pulse : 78, 158/80 pulse: 80 -Current dietary habits: she reports that she is eating more protein but she is also eating a lot more vegetables.  -Current exercise habits: Walking now for about 30 minutes, and other days she is doing her exercise bike and her elliptical machine for 30 minutes. She is now working out about an hour per day -Denies hypotensive/hypertensive symptoms -Educated on BP goals and benefits of medications for prevention of heart attack, stroke and kidney damage; Daily salt intake goal < 2300 mg; Importance of home blood pressure monitoring; Proper BP monitoring technique -Counseled to monitor BP at home four times per week, document, and provide log at future appointments -Recommended to continue current medication  Hyperlipidemia: (LDL goal < 70) -Uncontrolled -Current treatment: Pravastatin 40 mg  tablet once  per day  -Current dietary patterns: patient is beginning to limit fried and fatty foods.  -Current exercise habits: Walking now for about 30 minutes, and other days she is doing her exercise bike and her elliptical machine for 30 minutes. She is now working out about an hour per day -Educated on Cholesterol goals;  Benefits of statin for ASCVD risk reduction; Importance of limiting foods high in cholesterol; -Counseled on diet and exercise extensively Recommended to continue current medication  -Recommend that patient have lipid panel completed.   Diabetes (A1c goal <7%) -Uncontrolled -Current medications: Farxiga 10 mg - use once per day  Tresiba - 15 units daily  Humalog 100 unit/ml - sliding  Using occasionally, about twice per week  -Current home glucose readings fasting glucose: 119,134, 181, 182, 127 post prandial glucose:137,161, 157,181, 161,181, 161, 172  Patient reports that she has not had issues with her CGM, will follow up with the patient more closely.  -Denies hypoglycemic/hyperglycemic symptoms -Current meal patterns: will discuss further during next office visit -Current exercise: will discuss  -Educated on A1c and blood sugar goals; Prevention and management of hypoglycemic episodes; Continuous glucose monitoring; -Counseled to check feet daily and get yearly eye exams -Recommended to continue current medication  Patient Goals/Self-Care Activities Patient will:  - take medications as prescribed  Follow Up Plan: The patient has been provided with contact information for the care management team and has been advised to call with any health related questions or concerns.       Medication Assistance: None required.  Patient affirms current coverage meets needs.  Compliance/Adherence/Medication fill history: Care Gaps: Opthamology exam Shingrix vaccine Foot exam COVID-19 Booster  Star-Rating Drugs: Pravastatin 40 mg   Patient's preferred pharmacy  is:  [facility] - [place], [place] - [address] [place] [address] Phone: [phone] Fax: [phone]  Uses pill box? Yes Pt endorses 80% compliance  We discussed: Benefits of medication synchronization, packaging and delivery as well as enhanced pharmacist oversight with [facility]. Patient decided to: Continue current medication management strategy  Care Plan and Follow Up Patient Decision:  Patient agrees to Care Plan and Follow-up.  Plan: The patient has been provided with contact information for the care management team and has been advised to call with any health related questions or concerns.   [name], PharmD Clinical Pharmacist [facility] [phone]

## 2021-02-03 ENCOUNTER — Encounter: Payer: Self-pay | Admitting: Rehabilitative and Restorative Service Providers"

## 2021-02-03 ENCOUNTER — Ambulatory Visit: Payer: Medicare Other | Attending: Orthopaedic Surgery | Admitting: Rehabilitative and Restorative Service Providers"

## 2021-02-03 ENCOUNTER — Telehealth: Payer: Self-pay

## 2021-02-03 ENCOUNTER — Other Ambulatory Visit: Payer: Self-pay

## 2021-02-03 ENCOUNTER — Telehealth: Payer: Self-pay | Admitting: Rehabilitative and Restorative Service Providers"

## 2021-02-03 DIAGNOSIS — M79605 Pain in left leg: Secondary | ICD-10-CM | POA: Insufficient documentation

## 2021-02-03 DIAGNOSIS — M6281 Muscle weakness (generalized): Secondary | ICD-10-CM | POA: Insufficient documentation

## 2021-02-03 DIAGNOSIS — R2689 Other abnormalities of gait and mobility: Secondary | ICD-10-CM | POA: Insufficient documentation

## 2021-02-03 NOTE — Telephone Encounter (Signed)
PT phoned Lorin Mercy' office and left voicemail with receptionist of concerns of pt presentation over evaluation. Receptionist to get message to MD. PT also told her that the concerns are in the comments section of the order.

## 2021-02-03 NOTE — Patient Instructions (Signed)
PT discussed evaluative findings. Advised her to use cane/walker to assist with pain management and to decrease risk of injury to back, R LE, and to decrease falls risk. Pt advised to call MD next week regarding pain if MD office does not reach out to her and PT to call today regarding evaluative findings.

## 2021-02-03 NOTE — Telephone Encounter (Signed)
Can you please advise?

## 2021-02-03 NOTE — Therapy (Signed)
[place], [place], [address] Phone: [phone]   Fax:  [phone]  Physical Therapy Evaluation  Patient Details  Name: [name] MRN: [id number] Date of Birth: 1951/01/19 Referring Provider (PT): Dr. Lorin Mercy   Encounter Date: 02/03/2021   PT End of Session - 02/03/21 1104     Visit Number 1    Number of Visits 12    Date for PT Re-Evaluation 03/25/21    Authorization Type UHC MCR and MCD    Progress Note Due on Visit 10    PT Start Time 1019    PT Stop Time 1103    PT Time Calculation (min) 44 min    Activity Tolerance Patient limited by pain    Behavior During Therapy Restless             Past Medical History:  Diagnosis Date   Aortic stenosis    mild on 04/07/19 echo   Arthritis    L knee, hands, back    Asthma    Diabetes mellitus type 2, insulin dependent (HCC)    Fibromyalgia    Full dentures    Full dentures    GERD (gastroesophageal reflux disease)    H/O hiatal hernia    Headache    h/o migraines - was followed for a time with a wellness doctor   History of esophageal dilatation    History of rhabdomyolysis    03/ 2015   Hyperlipidemia    Hypertension    Insulin pump in place    since 12/ 2014--  [employer]   Lumbar disc herniation    right leg weakness   Osteoarthritis of left knee, primary localized 08/17/2014   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   Renal insufficiency    Rhabdomyolysis 11/15/2013   SUI (stress urinary incontinence, female)    Syncope 12/27/2017   Wears glasses    Wears glasses     Past Surgical History:  Procedure Laterality Date   BLADDER SUSPENSION N/A 03/09/2014   Procedure: CYSTOSCOPY/SLING;  Surgeon: Reece Packer, MD;  Location: [facility];  Service: Urology;  Laterality: N/A;   CARDIAC CATHETERIZATION  07-20-2009   DR [facility]  NORMAL  LVEF/  NORMAL CORONARY AND RENAL ARTERIES   CARPAL TUNNEL RELEASE  Right 2000   CHOLECYSTECTOMY  1990   COLONOSCOPY WITH ESOPHAGOGASTRODUODENOSCOPY (EGD)  06-18-2002   ESOPHAGOGASTRODUODENOSCOPY (EGD) WITH ESOPHAGEAL DILATION  06-12-2010   EXCISION LEFT UPPER ARM MASS  01-29-2014   EYE SURGERY  2010   laser on R eye   INTERSTIM IMPLANT REMOVAL N/A 03/06/2019   Procedure: REMOVAL OF Barrie Lyme IMPLANT;  Surgeon: Bjorn Loser, MD;  Location: [facility];  Service: Urology;  Laterality: N/A;   KNEE ARTHROSCOPY Right 1989  &  2002   LUMBAR LAMINECTOMY N/A 02/19/2020   Procedure: right L4 hemiaminectomy, microdiscectomy;  Surgeon: Marybelle Killings, MD;  Location: [facility];  Service: Orthopedics;  Laterality: N/A;   MULTIPLE TOOTH EXTRACTIONS     NEGATIVE SLEEP STUDY  2012   PER PT   PARTIAL KNEE ARTHROPLASTY Left 08/17/2014   Procedure: LEFT UNICOMPARTMENTAL KNEE;  Surgeon: Johnny Bridge, MD;  Location: [facility];  Service: Orthopedics;  Laterality: Left;    There were no vitals filed for this visit.    Subjective Assessment - 02/03/21 1145     Subjective Leg pain started 2-3 weeks ago after I had an epidural several weeks ago because of my  back pain. Have trouble lifting my left leg pain; it felt heavy. Weakness came all of a sudden.    Pertinent History Lumbar laminectomy 02/19/20.    Limitations Sitting;House hold activities;Standing;Walking    How long can you sit comfortably? 30 min    How long can you stand comfortably? up to 2 hours    How long can you walk comfortably? 10-15 min    Patient Stated Goals to be able to move without holding my leg    Currently in Pain? Yes    Pain Score 6     Pain Location Leg    Pain Orientation Left    Pain Descriptors / Indicators Tingling;Restless;Penetrating;Pins and needles    Pain Type Chronic pain    Pain Radiating Towards from inguinal crease to foot    Pain Onset More than a month ago    Aggravating Factors  standing, sitting    Pain Relieving Factors getting out of positgetting out of position    Effect of  Pain on Daily Activities sits down or lays down                [facility] PT Assessment - 02/03/21 0001       Assessment   Medical Diagnosis L LE pain    Referring Provider (PT) Dr. Lorin Mercy    Onset Date/Surgical Date 02/19/20    Hand Dominance Right    Next MD Visit July 2022    Prior Therapy n/a      Precautions   Precaution Comments no lifting > 5 lbs      Restrictions   Weight Bearing Restrictions No      Balance Screen   Has the patient fallen in the past 6 months No    Has the patient had a decrease in activity level because of a fear of falling?  Yes   due to pain   Is the patient reluctant to leave their home because of a fear of falling?  Yes   due to pain     [place] residence    Living Arrangements Non-relatives/Friends      Prior Function   Level of [place] with basic ADLs   has walker and cane at home     Cognition   Overall Cognitive Status Within Functional Limits for tasks assessed      Observation/Other Assessments   Observations restless, constantly rubs L LE during evaluation    Focus on Therapeutic Outcomes (FOTO)  not tested at eval      Observation/Other Assessments-Edema    Edema --   pt has swelling in L foot     Sensation   Light Touch Appears Intact      Coordination   Gross Motor Movements are Fluid and Coordinated No   due to left leg pain     Functional Tests   Functional tests Squat;Sit to Stand      Squat   Comments unable due to pain      Sit to Stand   Comments unable symmetrically due to pain; must use hands      Posture/Postural Control   Posture Comments slight right lateral trunk lift, standing with bil knee slightly bent, iliac crest, gluteal height equal      Deep Tendon Reflexes   DTR Assessment Site Patella;Achilles    Patella DTR 1+    Achilles DTR 1+      ROM / Strength   AROM / PROM /  Strength AROM;PROM;Strength      AROM   Overall AROM Comments pt  rests with L knee in -31 degrees ext; she is unable to actively flex/extend it further supine; in sitting, she has 6 degrees AROM knee flexion limited by L hip pain      PROM   Overall PROM Comments she is unable to tolerate PROM L hip/knee assessment      Strength   Overall Strength Comments R LE strength 4+ to 5/5 throughout; L hip flex trace/5, knee ext 3/5, knee flex 3+/5, DF 3+/5      Palpation   Patella mobility unable to assess due to pain    Palpation comment tender to light touch along L inguinal crease, proximal quad/ITB with difference in muscle tissue noted to mid quad; L foot swollen; Dorsalis Pedis pulse present      Special Tests   Other special tests unable to tolerate Fabers/Obers or any additional movements to L LE, unable to accurately assess Homan's, no warmth or swelling noted in L calf      Ambulation/Gait   Gait Comments ambulates with L hip ER, decreased WB L LE, and with L LE in full extension                        Objective measurements completed on examination: See above findings.                 PT Short Term Goals - 02/03/21 1142       PT SHORT TERM GOAL #1   Title Pt will be able to vacuum with 25% improvement in L LE pain    Baseline unable    Time 3    Period Weeks    Status New    Target Date 02/25/21      PT SHORT TERM GOAL #2   Title pt will be indep with HEP for pain management    Baseline not issued    Time 3    Period Weeks    Status New    Target Date 02/25/21               PT Long Term Goals - 02/03/21 1029       PT LONG TERM GOAL #1   Title pt will be able to rest at night with 75% improvement    Baseline unable    Time 7    Period Weeks    Status New    Target Date 03/25/21      PT LONG TERM GOAL #2   Title Pt will be able to get in/out of the car with 75% less difficulty with L LE    Baseline unable; uses hands dependently to assist L LE up and over into car    Time 7    Period Weeks     Status New      PT LONG TERM GOAL #3   Title Pt will demo improved L LE strength >/= 1 MMT grade to assist with improved functional mobility    Baseline L hip flex trace/5, knee ext 3/5, knee flex 3+/5, DF 3+/5    Time 7    Period Weeks    Status New    Target Date 03/25/21      PT LONG TERM GOAL #4   Title Pt will report improvement with gait difficulty x 50% to assist with shopping    Baseline unable    Time 7  Period Weeks    Status New    Target Date 03/25/21      PT LONG TERM GOAL #5   Title Pt will be able to lay supine with improved L knee ext to lacking </= -10 degrees to rest    Baseline -31 degrees    Time 7    Period Weeks    Status New    Target Date 03/25/21                    Plan - 02/03/21 1126     Clinical Impression Statement Pt presents to PT with L LE pain beginning at inguinal crease to foot. Pt with difference in musculature palpated compared to contralateral side at hip flex to distal Quad/ITB. Question Quad strain and possible tear. Pt with limited L hip A/PROM/strength and intolerant to full assessment at evaluation (special testing, PROM, etc). PT attempted consult with MD; advised pt to contact MD next week if no hear. Pt would benefit from PT for L LE pain management, modalities, manual therapy, ROM, and strengthening as tolerated. Pt also reports bladder change and increase in headaches since change in L LE strength which was insidious and sudden onset.    Personal Factors and Comorbidities Comorbidity 1;Comorbidity 2;Comorbidity 3+    Comorbidities Lumbar Laminectomy 02/19/20 L4-5 microdisectomy per chart review, aortic stenosis, syncope    Examination-Activity Limitations Bathing;Hygiene/Grooming;Squat;Stairs;Lift;Bed Mobility;Bend;Locomotion Level;Stand;Toileting;Reach Overhead;Caring for Others;Carry;Transfers;Sit;Continence;Dressing;Sleep    Examination-Participation Restrictions Cleaning;Shop;Community Activity;Meal Prep     Stability/Clinical Decision Making Evolving/Moderate complexity    Clinical Decision Making Moderate    Rehab Potential Fair    PT Frequency 2x / week    PT Duration --   7 weeks   PT Treatment/Interventions ADLs/Self Care Home Management;Cryotherapy;Ultrasound;Moist Heat;Iontophoresis 4mg /ml Dexamethasone;Electrical Stimulation;DME Instruction;Gait training;Stair training;Balance training;Therapeutic exercise;Therapeutic activities;Functional mobility training;Neuromuscular re-education;Patient/family education;Manual techniques;Passive range of motion;Dry needling;Vasopneumatic Device;Taping    PT Next Visit Plan see what MD says, issue HEP, do FOTO    Consulted and Agree with Plan of Care Patient             Patient will benefit from skilled therapeutic intervention in order to improve the following deficits and impairments:  Abnormal gait, Decreased endurance, Decreased mobility, Difficulty walking, Hypomobility, Increased muscle spasms, Decreased range of motion, Decreased activity tolerance, Decreased coordination, Decreased strength, Impaired flexibility, Postural dysfunction, Pain  Visit Diagnosis: Pain in left leg  Other abnormalities of gait and mobility  Muscle weakness (generalized)     Problem List Patient Active Problem List   Diagnosis Date Noted   Syncope 11/29/2020   Mild aortic stenosis 11/29/2020   Irritable bowel syndrome with diarrhea 10/10/2020   Gastroesophageal reflux disease with esophagitis 10/10/2020   Postlaminectomy syndrome, not elsewhere classified 07/03/2020   S/P lumbar laminectomy 03/30/2020   Localized swelling, mass and lump, multiple sites 03/27/2018   Type II diabetes mellitus with renal manifestations ([place]) 12/27/2017   HLD (hyperlipidemia) 12/27/2017   HTN (hypertension) 12/27/2017   CKD (chronic kidney disease), stage III ([place]) 12/27/2017   Cough variant asthma 12/01/2015   Osteoarthritis of left knee, primary localized 08/17/2014    Knee osteoarthritis 08/17/2014   Hypoglycemia 11/15/2013   Other and unspecified hyperlipidemia 08/06/2013   Type II diabetes mellitus, uncontrolled ([place]) 07/20/2013   Varicose veins of lower extremities with other complications 99/35/7017   Fibromyalgia    GERD 05/18/2010   ABDOMINAL PAIN, GENERALIZED 05/02/2010   OBESITY 04/20/2010   BURSITIS, LEFT SHOULDER 03/09/2010   Lipoma of arm  s/p excision 01/29/2014 01/10/2010   DEPRESSION 12/27/2009   BACK PAIN WITH RADICULOPATHY 12/27/2009   CYSTITIS, ACUTE 10/21/2009   MICROSCOPIC HEMATURIA 10/18/2009   KNEE PAIN, BILATERAL 10/18/2009   NEUROPATHY 09/08/2009   Asthma 09/08/2009   STRESS INCONTINENCE 09/08/2009   CHEST PAIN 07/19/2009   ESOPHAGEAL STRICTURE 08/27/2005    America Brown, PT, DPT 02/03/2021, 12:38 PM  [facility] [address] [place], [place], [address] Phone: [phone]   Fax:  [phone]  Name: KSENIA KUNZ MRN: [id number] Date of Birth: 1951/05/20

## 2021-02-03 NOTE — Telephone Encounter (Signed)
FYI...  I have patient scheduled to see you Monday 6/13 afternoon at 1pm.  Patient aware of appt.

## 2021-02-03 NOTE — Telephone Encounter (Signed)
Nikia with [facility] called stating that patient's left quad muscle feels different, patient can't bear weight and left foot is swollen.  Cb# [phone].  Please advise.

## 2021-02-06 ENCOUNTER — Encounter: Payer: Self-pay | Admitting: Orthopaedic Surgery

## 2021-02-06 ENCOUNTER — Ambulatory Visit (INDEPENDENT_AMBULATORY_CARE_PROVIDER_SITE_OTHER): Payer: Medicare Other | Admitting: Orthopaedic Surgery

## 2021-02-06 VITALS — BP 156/81 | HR 88 | Ht 64.0 in | Wt 146.0 lb

## 2021-02-06 DIAGNOSIS — M79605 Pain in left leg: Secondary | ICD-10-CM | POA: Diagnosis not present

## 2021-02-06 DIAGNOSIS — Z9889 Other specified postprocedural states: Secondary | ICD-10-CM | POA: Diagnosis not present

## 2021-02-06 DIAGNOSIS — M6281 Muscle weakness (generalized): Secondary | ICD-10-CM | POA: Insufficient documentation

## 2021-02-06 NOTE — Progress Notes (Signed)
Office Visit Note   Patient: [name]           Date of Birth: May 04, 1951           MRN: [id number] Visit Date: 02/06/2021              Requested by: [name], [place] [place],  [place] [address] PCP: [name], FNP   Assessment & Plan: Visit Diagnoses:  1. Pain in left leg   2. S/P lumbar laminectomy   3. Quadriceps weakness     Plan: Patient needs new lumbar MRI to rule out compression on the left side that could be consistent with hip flexion and quadriceps weakness on the left.  Epidural hematoma versus synovial facet cyst or large disc herniation are in the differential diagnosis.  She might have an iliopsoas hematoma, spontaneous.  Follow-up after MRI scan if this is negative then we will have to consider EMGs nerve conduction velocities.  Follow-Up Instructions: No follow-ups on file.   Orders:  Orders Placed This Encounter  Procedures   MR Lumbar Spine W Wo Contrast   No orders of the defined types were placed in this encounter.     Procedures: No procedures performed   Clinical Data: No additional findings.   Subjective: Chief Complaint  Patient presents with   Left Leg - Weakness    HPI 70 year old female returns with continued severe left hip flexion weakness and left quad weakness.  She can at least extend her leg actively today.  She went to physical therapy they states all her attempts at leg and hip and quad strengthening was extremely painful they are concerned that with palpation the muscle seem different.  Patient had no history of trauma falling, slipping that could suggest possible muscle rupture.  Sudden onset would not suggest tendinopathy.  She not been on blood thinner which could cause's spontaneous iliopsoas hematoma.  She had previous back surgery at L4-5 but it was on the right side and her weakness is on the left.  Patient had previous left knee medial compartment hemiarthroplasty which is doing well  not taking a bother currently.  Review of Systems negative for chills or fever she did have a little bit of a headache.  Onset of weakness was several weeks after an epidural.   Objective: Vital Signs: BP (!) 156/81   Pulse 88   Ht 5\' 4"  (1.626 m)   Wt 146 lb (66.2 kg)   LMP  (LMP Unknown)   BMI 25.06 kg/m   Physical Exam Constitutional:      Appearance: She is well-developed.  HENT:     Head: Normocephalic.     Right Ear: External ear normal.     Left Ear: External ear normal. There is no impacted cerumen.  Eyes:     Pupils: Pupils are equal, round, and reactive to light.  Neck:     Thyroid: No thyromegaly.     Trachea: No tracheal deviation.  Cardiovascular:     Rate and Rhythm: Normal rate.  Pulmonary:     Effort: Pulmonary effort is normal.  Abdominal:     Palpations: Abdomen is soft.  Musculoskeletal:     Cervical back: No rigidity.  Skin:    General: Skin is warm and dry.  Neurological:     Mental Status: She is alert and oriented to person, place, and time.  Psychiatric:        Behavior: Behavior normal.    Ortho  Exam patient has quad weakness I can overcome her quad with 2 fingers pressure at the ankle.  Abductor is strong.  Hamstring is strong.  Negative straight leg raising 90 degrees anterior tib gastrocsoleus ankle dorsiflexion plantarflexion is strong as well as peroneals and posterior tib right and left and symmetrical.  No right hip flexion or quad weakness.  Negative logroll of the hips both right and left.  Specialty Comments:  No specialty comments available.  Imaging: No results found.   PMFS History: Patient Active Problem List   Diagnosis Date Noted   Quadriceps weakness 02/06/2021   Syncope 11/29/2020   Mild aortic stenosis 11/29/2020   Irritable bowel syndrome with diarrhea 10/10/2020   Gastroesophageal reflux disease with esophagitis 10/10/2020   Postlaminectomy syndrome, not elsewhere classified 07/03/2020   S/P lumbar laminectomy  03/30/2020   Localized swelling, mass and lump, multiple sites 03/27/2018   Type II diabetes mellitus with renal manifestations ([place]) 12/27/2017   HLD (hyperlipidemia) 12/27/2017   HTN (hypertension) 12/27/2017   CKD (chronic kidney disease), stage III ([place]) 12/27/2017   Cough variant asthma 12/01/2015   Osteoarthritis of left knee, primary localized 08/17/2014   Knee osteoarthritis 08/17/2014   Hypoglycemia 11/15/2013   Other and unspecified hyperlipidemia 08/06/2013   Type II diabetes mellitus, uncontrolled ([place]) 07/20/2013   Varicose veins of lower extremities with other complications 66/01/3015   Fibromyalgia    GERD 05/18/2010   ABDOMINAL PAIN, GENERALIZED 05/02/2010   OBESITY 04/20/2010   BURSITIS, LEFT SHOULDER 03/09/2010   Lipoma of arm s/p excision 01/29/2014 01/10/2010   DEPRESSION 12/27/2009   BACK PAIN WITH RADICULOPATHY 12/27/2009   CYSTITIS, ACUTE 10/21/2009   MICROSCOPIC HEMATURIA 10/18/2009   KNEE PAIN, BILATERAL 10/18/2009   NEUROPATHY 09/08/2009   Asthma 09/08/2009   STRESS INCONTINENCE 09/08/2009   CHEST PAIN 07/19/2009   ESOPHAGEAL STRICTURE 08/27/2005   Past Medical History:  Diagnosis Date   Aortic stenosis    mild on 04/07/19 echo   Arthritis    L knee, hands, back    Asthma    Diabetes mellitus type 2, insulin dependent (HCC)    Fibromyalgia    Full dentures    Full dentures    GERD (gastroesophageal reflux disease)    H/O hiatal hernia    Headache    h/o migraines - was followed for a time with a wellness doctor   History of esophageal dilatation    History of rhabdomyolysis    03/ 2015   Hyperlipidemia    Hypertension    Insulin pump in place    since 12/ 2014--  [employer]   Lumbar disc herniation    right leg weakness   Osteoarthritis of left knee, primary localized 08/17/2014   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   Renal insufficiency    Rhabdomyolysis 11/15/2013   SUI (stress urinary incontinence, female)    Syncope 12/27/2017   Wears glasses     Wears glasses     Family History  Problem Relation Age of Onset   Diabetes Mother    Hypertension Mother    Lung cancer Mother        smoked   Diabetes Father    Hypertension Father    Lung cancer Father        smoked   Diabetes Brother    Heart failure Sister    Diabetes Maternal Grandmother    Cancer Maternal Grandmother     Past Surgical History:  Procedure Laterality Date   BLADDER SUSPENSION N/A 03/09/2014  Procedure: CYSTOSCOPY/SLING;  Surgeon: Reece Packer, MD;  Location: [facility];  Service: Urology;  Laterality: N/A;   CARDIAC CATHETERIZATION  07-20-2009   DR [facility]  NORMAL  LVEF/  NORMAL CORONARY AND RENAL ARTERIES   CARPAL TUNNEL RELEASE Right 2000   CHOLECYSTECTOMY  1990   COLONOSCOPY WITH ESOPHAGOGASTRODUODENOSCOPY (EGD)  06-18-2002   ESOPHAGOGASTRODUODENOSCOPY (EGD) WITH ESOPHAGEAL DILATION  06-12-2010   EXCISION LEFT UPPER ARM MASS  01-29-2014   EYE SURGERY  2010   laser on R eye   INTERSTIM IMPLANT REMOVAL N/A 03/06/2019   Procedure: REMOVAL OF Barrie Lyme IMPLANT;  Surgeon: Bjorn Loser, MD;  Location: [facility];  Service: Urology;  Laterality: N/A;   KNEE ARTHROSCOPY Right 1989  &  2002   LUMBAR LAMINECTOMY N/A 02/19/2020   Procedure: right L4 hemiaminectomy, microdiscectomy;  Surgeon: Marybelle Killings, MD;  Location: [facility];  Service: Orthopedics;  Laterality: N/A;   MULTIPLE TOOTH EXTRACTIONS     NEGATIVE SLEEP STUDY  2012   PER PT   PARTIAL KNEE ARTHROPLASTY Left 08/17/2014   Procedure: LEFT UNICOMPARTMENTAL KNEE;  Surgeon: Johnny Bridge, MD;  Location: [facility];  Service: Orthopedics;  Laterality: Left;   Social History   Occupational History   Occupation: disability  Tobacco Use   Smoking status: Never   Smokeless tobacco: Never  Vaping Use   Vaping Use: Never used  Substance and Sexual Activity   Alcohol use: No   Drug use: No   Sexual activity: Not Currently

## 2021-02-10 ENCOUNTER — Ambulatory Visit: Payer: Medicare Other

## 2021-02-10 DIAGNOSIS — Z794 Long term (current) use of insulin: Secondary | ICD-10-CM | POA: Diagnosis not present

## 2021-02-10 DIAGNOSIS — N3020 Other chronic cystitis without hematuria: Secondary | ICD-10-CM | POA: Diagnosis not present

## 2021-02-10 DIAGNOSIS — E118 Type 2 diabetes mellitus with unspecified complications: Secondary | ICD-10-CM | POA: Diagnosis not present

## 2021-02-13 ENCOUNTER — Ambulatory Visit
Admission: RE | Admit: 2021-02-13 | Discharge: 2021-02-13 | Disposition: A | Discharge status: Home / Self Care | Payer: Medicare Other | Source: Ambulatory Visit | Attending: Orthopaedic Surgery | Admitting: Orthopaedic Surgery

## 2021-02-13 ENCOUNTER — Other Ambulatory Visit: Payer: Self-pay

## 2021-02-13 ENCOUNTER — Encounter: Payer: Medicare Other | Admitting: Nurse Practitioner

## 2021-02-13 DIAGNOSIS — M4807 Spinal stenosis, lumbosacral region: Secondary | ICD-10-CM | POA: Diagnosis not present

## 2021-02-13 DIAGNOSIS — M5126 Other intervertebral disc displacement, lumbar region: Secondary | ICD-10-CM | POA: Diagnosis not present

## 2021-02-13 DIAGNOSIS — R531 Weakness: Secondary | ICD-10-CM | POA: Diagnosis not present

## 2021-02-13 DIAGNOSIS — Z9889 Other specified postprocedural states: Secondary | ICD-10-CM

## 2021-02-13 DIAGNOSIS — M5127 Other intervertebral disc displacement, lumbosacral region: Secondary | ICD-10-CM | POA: Diagnosis not present

## 2021-02-13 DIAGNOSIS — M79605 Pain in left leg: Secondary | ICD-10-CM

## 2021-02-13 MED ORDER — GADOBENATE DIMEGLUMINE 529 MG/ML IV SOLN
12.0000 mL | Freq: Once | INTRAVENOUS | Status: AC | PRN
  Administered 2021-02-13: 12 mL via INTRAVENOUS

## 2021-02-15 ENCOUNTER — Other Ambulatory Visit: Payer: Self-pay | Admitting: Nurse Practitioner

## 2021-02-15 ENCOUNTER — Ambulatory Visit: Payer: Medicare Other

## 2021-02-22 ENCOUNTER — Encounter: Payer: Medicare Other | Admitting: Rehabilitative and Restorative Service Providers"

## 2021-02-23 NOTE — Patient Instructions (Signed)
Visit Information It was great speaking with you today!  Please let me know if you have any questions about our visit.   Goals Addressed             This Visit's Progress    Manage My Medicine       Timeframe:  Long-Range Goal Priority:  High Start Date:                             Expected End Date:                       Follow Up Date 03/01/2021    - call for medicine refill 2 or 3 days before it runs out - call if I am sick and can't take my medicine - keep a list of all the medicines I take; vitamins and herbals too - learn to read medicine labels - use a pillbox to sort medicine - use an alarm clock or phone to remind me to take my medicine    Why is this important?   These steps will help you keep on track with your medicines.            Problem Identified: HTN, HLD, DM II   Priority: High     Long-Range Goal: Disease Management   This Visit's Progress: On track  Note:     Current Barriers:  Unable to independently monitor therapeutic efficacy  Pharmacist Clinical Goal(s):  Patient will achieve adherence to monitoring guidelines and medication adherence to achieve therapeutic efficacy through collaboration with PharmD and provider.   Interventions: 1:1 collaboration with [name], FNP regarding development and update of comprehensive plan of care as evidenced by provider attestation and co-signature Inter-disciplinary care team collaboration (see longitudinal plan of care) Comprehensive medication review performed; medication list updated in electronic medical record  Hypertension (BP goal <130/80) -Uncontrolled -Current treatment: Amlodipine 5 mg taking 1 tablet by mouth daily Spironolactone 50 mg tablet once per day in the morning  Stopped when she went to [place] for syncope  -Current home readings: 130/84  Pulse: 80, 159/84 Pulse-79, 138/80 pulse: 79, 141/73 pulse: 88, 134/83 pulse : 78, 158/80 pulse: 80 -Current dietary habits: she  reports that she is eating more protein but she is also eating a lot more vegetables.  -Current exercise habits: Walking now for about 30 minutes, and other days she is doing her exercise bike and her elliptical machine for 30 minutes. She is now working out about an hour per day -Denies hypotensive/hypertensive symptoms -Educated on BP goals and benefits of medications for prevention of heart attack, stroke and kidney damage; Daily salt intake goal < 2300 mg; Importance of home blood pressure monitoring; Proper BP monitoring technique -Counseled to monitor BP at home four times per week, document, and provide log at future appointments -Recommended to continue current medication  Hyperlipidemia: (LDL goal < 70) -Uncontrolled -Current treatment: Pravastatin 40 mg tablet once per day  -Current dietary patterns: patient is beginning to limit fried and fatty foods.  -Current exercise habits: Walking now for about 30 minutes, and other days she is doing her exercise bike and her elliptical machine for 30 minutes. She is now working out about an hour per day -Educated on Cholesterol goals;  Benefits of statin for ASCVD risk reduction; Importance of limiting foods high in cholesterol; -Counseled on diet and exercise extensively Recommended to continue current medication  -  Recommend that patient have lipid panel completed will also recommend patients medication be increased to pravastatin 80 mg tablet daily.   Diabetes (A1c goal <7%) -Uncontrolled -Current medications: Farxiga 10 mg - use once per day  Tresiba - 15 units daily  Humalog 100 unit/ml - sliding  Using occasionally, about twice per week  -Current home glucose readings fasting glucose: 119,134, 181, 182, 127 post prandial glucose:137,161, 157,181, 161,181, 161, 172  Patient reports that she has not had issues with her CGM, will follow up with the patient more closely.  -Denies hypoglycemic/hyperglycemic symptoms -Current meal  patterns: will discuss further during next office visit -Current exercise: will discuss  -Educated on A1c and blood sugar goals; Prevention and management of hypoglycemic episodes; Continuous glucose monitoring; -Counseled to check feet daily and get yearly eye exams -Recommended to continue current medication  Patient Goals/Self-Care Activities Patient will:  - take medications as prescribed  Follow Up Plan: The patient has been provided with contact information for the care management team and has been advised to call with any health related questions or concerns.        Patient agreed to services and verbal consent obtained.   The patient verbalized understanding of instructions, educational materials, and care plan provided today and agreed to receive a mailed copy of patient instructions, educational materials, and care plan.   [name], PharmD Clinical Pharmacist [facility] [phone]

## 2021-02-24 ENCOUNTER — Telehealth: Payer: Self-pay

## 2021-02-24 NOTE — Chronic Care Management (AMB) (Addendum)
Chronic Care Management Pharmacy Assistant   Name: [name]  MRN: [id number] DOB: June 09, 1951  Reason for Encounter: Medication Review/ Medication coordination   Recent office visits:  None  Recent consult visits:  02-03-2021 Erenest Rasher, PT (Physical therapy).  02-06-2021 Marybelle Killings, MD (Orthopedic). MR Lumbar spine w/ w/o Contrast placed.  Hospital visits:  None in previous 6 months  Medications: Outpatient Encounter Medications as of 02/24/2021  Medication Sig   amLODipine (NORVASC) 5 MG tablet Take 1 tablet (5 mg total) by mouth at bedtime.   aspirin EC 81 MG EC tablet Take 1 tablet (81 mg total) by mouth daily. Swallow whole.   blood glucose meter kit and supplies KIT Dispense based on patient and insurance preference. Check blood sugars 4 times daily E11.69   Continuous Blood Gluc Receiver (FREESTYLE LIBRE READER) DEVI 1 each by Does not apply route See admin instructions. CGM device-Freestyle Libre   Continuous Blood Gluc Sensor (FREESTYLE LIBRE 14 DAY SENSOR) MISC 1 each by Does not apply route See admin instructions. CGM 14-day sensors; send refills to [employer] (in Epic)   FARXIGA 10 MG TABS tablet TAKE ONE TABLET BY MOUTH BEFORE BREAKFAST (Patient taking differently: Take 10 mg by mouth daily.)   HUMALOG 100 UNIT/ML injection Inject 10 Units into the skin daily. Adjust with sliding scale   insulin degludec (TRESIBA FLEXTOUCH) 100 UNIT/ML FlexTouch Pen Inject 15 Units into the skin daily.   Insulin Pen Needle (BD PEN NEEDLE MICRO U/F) 32G X 6 MM MISC DX:E11.65 USE TO CHECK BLOOD SUGARS THREE TIMES A DAY   Lancets Misc. MISC 1 each by Does not apply route 4 (four) times daily -  with meals and at bedtime.   linaclotide (LINZESS) 145 MCG CAPS capsule Take 1 capsule (145 mcg total) by mouth daily before breakfast.   mirabegron ER (MYRBETRIQ) 50 MG TB24 tablet Take 1 tablet (50 mg total) by mouth daily.   Multiple Vitamin (MULTIVITAMIN WITH MINERALS)  TABS tablet Take 1 tablet by mouth daily.   ONETOUCH VERIO test strip USE TO test blood sugar FOUR TIMES DAILY   pantoprazole (PROTONIX) 40 MG tablet Take 1 tablet (40 mg total) by mouth daily.   pravastatin (PRAVACHOL) 40 MG tablet TAKE ONE TABLET BY MOUTH EVERYDAY AT BEDTIME (Patient taking differently: Take 40 mg by mouth daily.)   pregabalin (LYRICA) 50 MG capsule Take 1 capsule (50 mg total) by mouth 3 (three) times daily. (Patient taking differently: Take 50 mg by mouth daily.)   spironolactone (ALDACTONE) 50 MG tablet Take 50 mg by mouth every morning.   Vitamin D, Ergocalciferol, (DRISDOL) 1.25 MG (50000 UNIT) CAPS capsule TAKE ONE CAPSULE BY MOUTH ONCE WEEKLY ON WEDNESDAY   No facility-administered encounter medications on file as of 02/24/2021.   Reviewed chart for medication changes ahead of medication coordination call.  No OVs, Consults, or hospital visits since last care coordination call/Pharmacist visit. (If appropriate, list visit date, provider name)  No medication changes indicated OR if recent visit, treatment plan here.  BP Readings from Last 3 Encounters:  02/06/21 (!) 156/81  01/25/21 (!) 180/76  12/13/20 124/78    Lab Results  Component Value Date   HGBA1C 10.4 (H) 11/29/2020     Patient obtains medications through Adherence Packaging  30 Days   Last adherence delivery included:  Women's 50 Plus Multivitamin- 1 tablet daily (breakfast) Myrbetriq 50 mg- 1 tablet daily (breakfast) Ergocalciferol 50,000 units- 1 capsule weekly on Wednesdays (breakfast)  Farxiga 10 gm- 1 tablet daily (before breakfast) Pravastatin 40 mg- 1 tablet daily (bedtime) Linzess 145 mcg- 1 tablet daily(before breakfast) Amlodipine 5 mg- 1 tablet daily (bedtime) Pantoprazole 40 mg- 1 tablet daily (breakfast) Lyrica 50 mg- 1 tablet three times a day (breakfast, lunch, bedtime) Humalog Kwik Injection pen 25 units Verio test strips  Patient declined (meds) last month: Spironolactone 50  mg- Medication discontinued  Patient is due for next adherence delivery on: 03-02-2021  Called patient and reviewed medications and coordinated delivery.  This delivery to include: Women's 50 Plus Multivitamin- 1 tablet daily (breakfast) Ergocalciferol 50,000 units- 1 capsule weekly on Wednesdays (breakfast) Farxiga 10 gm- 1 tablet daily (before breakfast) Pravastatin 40 mg- 1 tablet daily (bedtime) Linzess 145 mcg- 1 tablet daily(before breakfast) Amlodipine 5 mg- 1 tablet daily (bedtime) Pantoprazole 40 mg- 1 tablet daily (breakfast) Lyrica 50 mg- 1 tablet three times a day (breakfast, lunch, bedtime) Humalog Kwik Injection pen 25 units  No short/ acute fills needed  Patient declined the following medications: None  Patient needs refills for: None  Confirmed delivery date of 03-02-2021 advised patient that pharmacy will contact them the morning of delivery.  NOTES: Dispensing delivery form was dated 03-08-2021 which should be 03-02-2021. Sent technician Patrice Paradise a message to change delivery date to 03-02-2021. Rescheduled patient's appointment with [name] on 03-01-2021 to 03-16-2021 at 12:30.   Star Rating Drugs: Pravastatin 40 mg- Last filled 01-31-2021 [facility] Farxiga 10 mg- Last filled 01-31-2021 [facility] Ozempic 1 MG- Last filled 09-05-2020 [facility]  [place] Clinical Pharmacist Assistant [phone]  I have reviewed the care management and care coordination activities outlined in this encounter and I am certifying that I agree with the content of this note. No further action required.  Total Time: 10 minutes   Mayford Knife, [facility] 03/09/21 10:28 AM

## 2021-02-28 ENCOUNTER — Encounter: Payer: Self-pay | Admitting: Rehabilitative and Restorative Service Providers"

## 2021-02-28 ENCOUNTER — Other Ambulatory Visit: Payer: Self-pay

## 2021-02-28 ENCOUNTER — Ambulatory Visit: Payer: Medicare Other | Attending: Orthopaedic Surgery | Admitting: Rehabilitative and Restorative Service Providers"

## 2021-02-28 DIAGNOSIS — M6281 Muscle weakness (generalized): Secondary | ICD-10-CM | POA: Insufficient documentation

## 2021-02-28 DIAGNOSIS — M79605 Pain in left leg: Secondary | ICD-10-CM | POA: Diagnosis not present

## 2021-02-28 DIAGNOSIS — R2689 Other abnormalities of gait and mobility: Secondary | ICD-10-CM | POA: Diagnosis not present

## 2021-02-28 NOTE — Patient Instructions (Addendum)
Access Code: [place]  Exercises Supine Quad Set - 1 x daily - 3 x weekly - 1 sets - 10 reps Standing Knee Flexion AROM with Chair Support - 1 x daily - 3 x weekly - 1 sets - 10 reps Standing Heel Raise with Support - 1 x daily - 3 x weekly - 1 sets - 10 reps Toe Raises with Counter Support - 1 x daily - 3 x weekly - 1 sets - 10 reps  DISCUSSED WITH PT IMPORTANCE OF NOT WORKING THROUGH THE PAIN AS SHE HAS A HABIT OF DOING SO. STRESSED THAT SHE MAY HAVE TO LIMIT HER ROM IN PERFORMING HEP UNTIL SHE LOOSENS UP PRIOR TO PERFORMING FULL ROM.

## 2021-02-28 NOTE — Therapy (Signed)
[place], [place], [address] Phone: [phone]   Fax:  [phone]  Physical Therapy Treatment  Patient Details  Name: [name] MRN: [id number] Date of Birth: 1951-07-05 Referring Provider (PT): Dr. Lorin Mercy   Encounter Date: 02/28/2021   PT End of Session - 02/28/21 1509     Visit Number 2    Number of Visits 12    Date for PT Re-Evaluation 03/25/21    Authorization Type UHC MCR and MCD    PT Start Time 0240    PT Stop Time 0326    PT Time Calculation (min) 46 min    Activity Tolerance Patient tolerated treatment well;Patient limited by pain    Behavior During Therapy Restless             Past Medical History:  Diagnosis Date   Aortic stenosis    mild on 04/07/19 echo   Arthritis    L knee, hands, back    Asthma    Diabetes mellitus type 2, insulin dependent ([place])    Fibromyalgia    Full dentures    Full dentures    GERD (gastroesophageal reflux disease)    H/O hiatal hernia    Headache    h/o migraines - was followed for a time with a wellness doctor   History of esophageal dilatation    History of rhabdomyolysis    03/ 2015   Hyperlipidemia    Hypertension    Insulin pump in place    since 12/ 2014--  [employer]   Lumbar disc herniation    right leg weakness   Osteoarthritis of left knee, primary localized 08/17/2014   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   Renal insufficiency    Rhabdomyolysis 11/15/2013   SUI (stress urinary incontinence, female)    Syncope 12/27/2017   Wears glasses    Wears glasses     Past Surgical History:  Procedure Laterality Date   BLADDER SUSPENSION N/A 03/09/2014   Procedure: CYSTOSCOPY/SLING;  Surgeon: Reece Packer, MD;  Location: [place];  Service: Urology;  Laterality: N/A;   CARDIAC CATHETERIZATION  07-20-2009   DR [place]  NORMAL  LVEF/  NORMAL CORONARY AND RENAL ARTERIES   CARPAL TUNNEL RELEASE Right  2000   CHOLECYSTECTOMY  1990   COLONOSCOPY WITH ESOPHAGOGASTRODUODENOSCOPY (EGD)  06-18-2002   ESOPHAGOGASTRODUODENOSCOPY (EGD) WITH ESOPHAGEAL DILATION  06-12-2010   EXCISION LEFT UPPER ARM MASS  01-29-2014   EYE SURGERY  2010   laser on R eye   INTERSTIM IMPLANT REMOVAL N/A 03/06/2019   Procedure: REMOVAL OF [place] IMPLANT;  Surgeon: Bjorn Loser, MD;  Location: [facility];  Service: Urology;  Laterality: N/A;   KNEE ARTHROSCOPY Right 1989  &  2002   LUMBAR LAMINECTOMY N/A 02/19/2020   Procedure: right L4 hemiaminectomy, microdiscectomy;  Surgeon: Marybelle Killings, MD;  Location: [facility];  Service: Orthopedics;  Laterality: N/A;   MULTIPLE TOOTH EXTRACTIONS     NEGATIVE SLEEP STUDY  2012   PER PT   PARTIAL KNEE ARTHROPLASTY Left 08/17/2014   Procedure: LEFT UNICOMPARTMENTAL KNEE;  Surgeon: Johnny Bridge, MD;  Location: [place];  Service: Orthopedics;  Laterality: Left;    There were no vitals filed for this visit.   Subjective Assessment - 02/28/21 1450     Subjective I am doing much better after the evaluation. Maybe we put something back into place. I went to the MD; he checked me out.  Said I was good.    Currently in Pain? No/denies    Pain Score 0-No pain    Pain Radiating Towards popliteal fossa                               [facility] Adult PT Treatment/Exercise - 02/28/21 0001       Exercises   Exercises Knee/Hip      Knee/Hip Exercises: Aerobic   Nustep level 3 x 5 min LEs only with PT verbal cues to not work into the pain      Knee/Hip Exercises: Standing   Other Standing Knee Exercises hamstring curl at the counter x 5 each side with pt able to tolerate; double heel raise/toe raise x 10 each at the counter top      Knee/Hip Exercises: Supine   Other Supine Knee/Hip Exercises quad sets L x 15, attempted SLR limited AROM x 4 prior to pt c/o L HS pain; attempted hamstring dig x 4 prior to Hamstring pain; HS KTC stretch 3x15 sec with pt discomfort mid  hamstring; attempted heel slides L with pt with hamstring pain x 3      Knee/Hip Exercises: Sidelying   Other Sidelying Knee/Hip Exercises L Thomas/Ober's combo 3x15-30 sec; L clam shell limited AROM x 15                      PT Short Term Goals - 02/28/21 1648       PT SHORT TERM GOAL #1   Title Pt will be able to vacuum with 25% improvement in L LE pain    Status On-going      PT SHORT TERM GOAL #2   Title pt will be indep with HEP for pain management    Status On-going               PT Long Term Goals - 02/03/21 1029       PT LONG TERM GOAL #1   Title pt will be able to rest at night with 75% improvement    Baseline unable    Time 7    Period Weeks    Status New    [place] Date 03/25/21      PT LONG TERM GOAL #2   Title Pt will be able to get in/out of the car with 75% less difficulty with L LE    Baseline unable; uses hands dependently to assist L LE up and over into car    Time 7    Period Weeks    Status New      PT LONG TERM GOAL #3   Title Pt will demo improved L LE strength >/= 1 MMT grade to assist with improved functional mobility    Baseline L hip flex trace/5, knee ext 3/5, knee flex 3+/5, DF 3+/5    Time 7    Period Weeks    Status New    [place] Date 03/25/21      PT LONG TERM GOAL #4   Title Pt will report improvement with gait difficulty x 50% to assist with shopping    Baseline unable    Time 7    Period Weeks    Status New    Target Date 03/25/21      PT LONG TERM GOAL #5   Title Pt will be able to lay supine with improved L knee ext to lacking </= -10 degrees  to rest    Baseline -31 degrees    Time 7    Period Weeks    Status New    Target Date 03/25/21                   Plan - 02/28/21 1509     Clinical Impression Statement Pt presents to PT with L LE pain reported to be improved and centralized to mid-Hamstring this visit.  Pt is better able to flex L hip this visit than at evaluation due to L Quad  discomfort.  Pt, however, has increased discomfort at L mid Hamstring with any knee flexion exercises. Pt would benefit from PT for L LE pain management, modalities, manual therapy, ROM, and strengthening as tolerated. Pt also reports bladder change and increase in headaches since change in L LE strength which was insidious and sudden onset.    PT Treatment/Interventions ADLs/Self Care Home Management;Cryotherapy;Ultrasound;Moist Heat;Iontophoresis 4mg /ml Dexamethasone;Electrical Stimulation;DME Instruction;Gait training;Stair training;Balance training;Therapeutic exercise;Therapeutic activities;Functional mobility training;Neuromuscular re-education;Patient/family education;Manual techniques;Passive range of motion;Dry needling;Vasopneumatic Device;Taping    PT Next Visit Plan do FOTO; review HEP; work on gentle and possibly modified HS and Quad activation strengthening exercises. ? [place] L prox Quad and mid HS    PT [place]             Patient will benefit from skilled therapeutic intervention in order to improve the following deficits and impairments:  Abnormal gait, Decreased endurance, Decreased mobility, Difficulty walking, Hypomobility, Increased muscle spasms, Decreased range of motion, Decreased activity tolerance, Decreased coordination, Decreased strength, Impaired flexibility, Postural dysfunction, Pain  Visit Diagnosis: Pain in left leg  Other abnormalities of gait and mobility  Muscle weakness (generalized)     Problem List Patient Active Problem List   Diagnosis Date Noted   Quadriceps weakness 02/06/2021   Syncope 11/29/2020   Mild aortic stenosis 11/29/2020   Irritable bowel syndrome with diarrhea 10/10/2020   Gastroesophageal reflux disease with esophagitis 10/10/2020   Postlaminectomy syndrome, not elsewhere classified 07/03/2020   S/P lumbar laminectomy 03/30/2020   Localized swelling, mass and lump, multiple sites 03/27/2018   Type II diabetes  mellitus with renal manifestations ([place]) 12/27/2017   HLD (hyperlipidemia) 12/27/2017   HTN (hypertension) 12/27/2017   CKD (chronic kidney disease), stage III ([facility]) 12/27/2017   Cough variant asthma 12/01/2015   Osteoarthritis of left knee, primary localized 08/17/2014   Knee osteoarthritis 08/17/2014   Hypoglycemia 11/15/2013   Other and unspecified hyperlipidemia 08/06/2013   Type II diabetes mellitus, uncontrolled ([place]) 07/20/2013   Varicose veins of lower extremities with other complications 94/85/4627   Fibromyalgia    GERD 05/18/2010   ABDOMINAL PAIN, GENERALIZED 05/02/2010   OBESITY 04/20/2010   BURSITIS, LEFT SHOULDER 03/09/2010   Lipoma of arm s/p excision 01/29/2014 01/10/2010   DEPRESSION 12/27/2009   BACK PAIN WITH RADICULOPATHY 12/27/2009   CYSTITIS, ACUTE 10/21/2009   MICROSCOPIC HEMATURIA 10/18/2009   KNEE PAIN, BILATERAL 10/18/2009   NEUROPATHY 09/08/2009   Asthma 09/08/2009   STRESS INCONTINENCE 09/08/2009   CHEST PAIN 07/19/2009   ESOPHAGEAL STRICTURE 08/27/2005    America Brown, PT, DPT 02/28/2021, 4:49 PM  [facility] [address] [place], [place], [address] Phone: [phone]   Fax:  [phone]  Name: [name] MRN: [id number] Date of Birth: 06-Mar-1951

## 2021-03-01 ENCOUNTER — Ambulatory Visit: Payer: Self-pay

## 2021-03-02 ENCOUNTER — Other Ambulatory Visit: Payer: Self-pay | Admitting: Nurse Practitioner

## 2021-03-02 ENCOUNTER — Telehealth: Payer: Medicare Other

## 2021-03-02 DIAGNOSIS — E1142 Type 2 diabetes mellitus with diabetic polyneuropathy: Secondary | ICD-10-CM

## 2021-03-03 ENCOUNTER — Other Ambulatory Visit: Payer: Self-pay

## 2021-03-03 ENCOUNTER — Ambulatory Visit: Payer: Medicare Other

## 2021-03-03 DIAGNOSIS — R2689 Other abnormalities of gait and mobility: Secondary | ICD-10-CM | POA: Diagnosis not present

## 2021-03-03 DIAGNOSIS — M79605 Pain in left leg: Secondary | ICD-10-CM

## 2021-03-03 DIAGNOSIS — M6281 Muscle weakness (generalized): Secondary | ICD-10-CM

## 2021-03-03 NOTE — Patient Instructions (Signed)
Home Exercise Program [[id number]]  POSTERIOR PELVIC TILT -  Repeat 15 Times, Hold 3 Seconds,  MEDICINE BALL BRIDGE -  Repeat 15 Times, Hold 3 Seconds, Complete 1 Set, Perform 1 Times a Day  SUPINE HIP ABDUCTION - ELASTIC BAND CLAMS - [place] -  Repeat 15 Times, Hold 5 Seconds, Complete 1 Set, Perform 1 Times a Day  VMO extensions -  Repeat 15 Times, Hold 1 Second(s), Complete 1 Set,

## 2021-03-03 NOTE — Therapy (Signed)
[facility] [place], [place], [address] Phone: [phone]   Fax:  [phone]  Physical Therapy Treatment / Discharge  Patient Details  Name: [name] MRN: [id number] Date of Birth: [birth year] Referring Provider (PT): Dr. Lorin Mercy   PHYSICAL THERAPY DISCHARGE SUMMARY  Visits from Start of Care: 3  Current functional level related to goals / functional outcomes: All goals met/ WFL   Remaining deficits: Returned to premorbid level   Education / Equipment: Advanced HEP   Patient agrees to discharge. Patient goals were met. Patient is being discharged due to meeting the stated rehab goals.  Encounter Date: 03/03/2021   PT End of Session - 03/03/21 1025     Visit Number 3    Number of Visits 12    Date for PT Re-Evaluation 03/25/21    Authorization Type UHC MCR and MCD    PT Start Time 1017    PT Stop Time 1057    PT Time Calculation (min) 40 min    Activity Tolerance Patient tolerated treatment well    Behavior During Therapy WFL for tasks assessed/performed             Past Medical History:  Diagnosis Date   Aortic stenosis    mild on 04/07/19 echo   Arthritis    L knee, hands, back    Asthma    Diabetes mellitus type 2, insulin dependent (HCC)    Fibromyalgia    Full dentures    Full dentures    GERD (gastroesophageal reflux disease)    H/O hiatal hernia    Headache    h/o migraines - was followed for a time with a wellness doctor   History of esophageal dilatation    History of rhabdomyolysis    03/ 2015   Hyperlipidemia    Hypertension    Insulin pump in place    since 12/ 2014--  [employer]   Lumbar disc herniation    right leg weakness   Osteoarthritis of left knee, primary localized 08/17/2014   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   Renal insufficiency    Rhabdomyolysis 11/15/2013   SUI (stress urinary incontinence, female)    Syncope 12/27/2017   Wears glasses    Wears glasses      Past Surgical History:  Procedure Laterality Date   BLADDER SUSPENSION N/A 03/09/2014   Procedure: CYSTOSCOPY/SLING;  Surgeon: Reece Packer, MD;  Location: [facility];  Service: Urology;  Laterality: N/A;   CARDIAC CATHETERIZATION  07-20-2009   DR [place]  NORMAL  LVEF/  NORMAL CORONARY AND RENAL ARTERIES   CARPAL TUNNEL RELEASE Right 2000   CHOLECYSTECTOMY  1990   COLONOSCOPY WITH ESOPHAGOGASTRODUODENOSCOPY (EGD)  06-18-2002   ESOPHAGOGASTRODUODENOSCOPY (EGD) WITH ESOPHAGEAL DILATION  06-12-2010   EXCISION LEFT UPPER ARM MASS  01-29-2014   EYE SURGERY  2010   laser on R eye   INTERSTIM IMPLANT REMOVAL N/A 03/06/2019   Procedure: REMOVAL OF Barrie Lyme IMPLANT;  Surgeon: Bjorn Loser, MD;  Location: [facility];  Service: Urology;  Laterality: N/A;   KNEE ARTHROSCOPY Right 1989  &  2002   LUMBAR LAMINECTOMY N/A 02/19/2020   Procedure: right L4 hemiaminectomy, microdiscectomy;  Surgeon: Marybelle Killings, MD;  Location: [facility];  Service: Orthopedics;  Laterality: N/A;   MULTIPLE TOOTH EXTRACTIONS     NEGATIVE SLEEP STUDY  2012   PER PT   PARTIAL KNEE ARTHROPLASTY Left 08/17/2014   Procedure: LEFT UNICOMPARTMENTAL  KNEE;  Surgeon: Johnny Bridge, MD;  Location: [facility];  Service: Orthopedics;  Laterality: Left;    There were no vitals filed for this visit.   Subjective Assessment - 03/03/21 1026     Subjective The patient reports that she is doing much better.  She thinks that this will be her last appointment.  She is doing the exercises at home.  The left leg is not bothering her anymore.    Currently in Pain? No/denies                [facility] PT Assessment - 03/03/21 0001       Assessment   Medical Diagnosis L LE pain    Referring Provider (PT) Dr. Lorin Mercy      Observation/Other Assessments   Focus on Therapeutic Outcomes (FOTO)  72% - LE      Squat   Comments 1/2 depth no use of hands      Sit to Stand   Comments able to perform  without use of hands / equal weight bearing      Posture/Postural Control   Posture Comments equal weight bearing in standing      Deep Tendon Reflexes   Patella DTR 1+    Achilles DTR 1+      AROM   Right Knee Extension 0    Right Knee Flexion 145    Left Knee Extension 0    Left Knee Flexion 130      Strength   Right Hip Flexion 5/5    Right Hip External Rotation  4+/5    Right Hip Internal Rotation 5/5    Left Hip Flexion 4+/5    Left Hip External Rotation 4+/5    Left Hip Internal Rotation 5/5    Right Knee Flexion 5/5    Right Knee Extension 4+/5    Left Knee Flexion 5/5    Left Knee Extension 4+/5    Right Ankle Dorsiflexion 5/5    Left Ankle Dorsiflexion 5/5                           [facility] Adult PT Treatment/Exercise - 03/03/21 0001       Lumbar Exercises: Supine   Pelvic Tilt 15 reps;5 seconds    Clam 15 reps;5 seconds    Clam Limitations [place] TB    Bridge with [employer] 15 reps;3 seconds    Basic Lumbar Stabilization 15 reps    Advanced Lumbar Stabilization Limitations ball squeeze with unilat knee ext      Knee/Hip Exercises: Aerobic   Nustep level 6 x 6 min LEs only with PT verbal cues to not work into the pain      Knee/Hip Exercises: Standing   Heel Raises 20 reps;Both                    PT Education - 03/03/21 1105     Education Details advanced HEP    Person(s) Educated Patient    Methods Explanation;Handout    Comprehension Verbalized understanding              PT Short Term Goals - 03/03/21 1047       PT SHORT TERM GOAL #1   Title Pt will be able to vacuum with 25% improvement in L LE pain    Baseline unable    Time 3    Period Weeks    Status Achieved    Target  Date 02/25/21      PT SHORT TERM GOAL #2   Title pt will be indep with HEP for pain management    Baseline not issued    Time 3    Period Weeks    Status Achieved    Target Date 02/25/21               PT Long Term Goals -  03/03/21 1047       PT LONG TERM GOAL #1   Title pt will be able to rest at night with 75% improvement    Baseline unable (7/8: able to sleep through the night)    Time 7    Period Weeks    Status Achieved    Target Date 03/25/21      PT LONG TERM GOAL #2   Title Pt will be able to get in/out of the car with 75% less difficulty with L LE    Baseline unable; uses hands dependently to assist L LE up and over into car  (7/8: Indepdent with car transfers, no longer needs to lift the left leg)    Time 7    Period Weeks    Status Achieved    Target Date 03/25/21      PT LONG TERM GOAL #3   Title Pt will demo improved L LE strength >/= 1 MMT grade to assist with improved functional mobility    Baseline L hip flex trace/5, knee ext 3/5, knee flex 3+/5, DF 3+/5    Time 7    Period Weeks    Status Achieved    Target Date 03/25/21      PT LONG TERM GOAL #4   Title Pt will report improvement with gait difficulty x 50% to assist with shopping    Baseline unable (7/8: able to all grocery shopping)    Time 7    Period Weeks    Status Achieved    Target Date 03/25/21      PT LONG TERM GOAL #5   Title Pt will be able to lay supine with improved L knee ext to lacking </= -10 degrees to rest    Baseline -31 degrees (7/8: 0 deg)    Time 7    Period Weeks    Status Achieved    Target Date 03/25/21                   Plan - 03/03/21 1034     Clinical Impression Statement Traci has attended 3 sessions of physical therapy.  She arrived to therapy today reporting inprovement of her symptoms.  She reports 0/10 pain and she no longer has difficulty with her daily activities.  She is able to lift her leg into the car without using her hands and do her normal grocery shopping.  The patient reports that she is does go the pool and do exercises there.  The patient presents with improvement of her posture.  She is able to transfer sit to stand without use of hands and equal weight bearing.   She is able to squat to 1/2 depth.  Her lower extremity strength is 4+ to 5 out of 5.  Progressed the patient's current HEP.  See the goals listed below.  Recommend discharge from therapy at the time.    Personal Factors and Comorbidities Comorbidity 1;Comorbidity 2;Comorbidity 3+    Comorbidities Lumbar Laminectomy 02/19/20 L4-5 microdisectomy per chart review, aortic stenosis, syncope    PT Treatment/Interventions ADLs/Self Care  Home Management;Cryotherapy;Ultrasound;Moist Heat;Iontophoresis 52m/ml Dexamethasone;Electrical Stimulation;DME Instruction;Gait training;Stair training;Balance training;Therapeutic exercise;Therapeutic activities;Functional mobility training;Neuromuscular re-education;Patient/family education;Manual techniques;Passive range of motion;Dry needling;Vasopneumatic Device;Taping    PT Next Visit Plan [place] to [facility]- See pt instructions for updated HEP    Consulted and Agree with Plan of Care Patient             Patient will benefit from skilled therapeutic intervention in order to improve the following deficits and impairments:     Visit Diagnosis: Pain in left leg  Muscle weakness (generalized)  Other abnormalities of gait and mobility    Problem List Patient Active Problem List   Diagnosis Date Noted   Quadriceps weakness 02/06/2021   Syncope 11/29/2020   Mild aortic stenosis 11/29/2020   Irritable bowel syndrome with diarrhea 10/10/2020   Gastroesophageal reflux disease with esophagitis 10/10/2020   Postlaminectomy syndrome, not elsewhere classified 07/03/2020   S/P lumbar laminectomy 03/30/2020   Localized swelling, mass and lump, multiple sites 03/27/2018   Type II diabetes mellitus with renal manifestations ([facility] 12/27/2017   HLD (hyperlipidemia) 12/27/2017   HTN (hypertension) 12/27/2017   CKD (chronic kidney disease), stage III (HCC) 12/27/2017   Cough variant asthma 12/01/2015   Osteoarthritis of left knee,  primary localized 08/17/2014   Knee osteoarthritis 08/17/2014   Hypoglycemia 11/15/2013   Other and unspecified hyperlipidemia 08/06/2013   Type II diabetes mellitus, uncontrolled ([facility] 07/20/2013   Varicose veins of lower extremities with other complications 009/32/6712  Fibromyalgia    GERD 05/18/2010   ABDOMINAL PAIN, GENERALIZED 05/02/2010   OBESITY 04/20/2010   BURSITIS, LEFT SHOULDER 03/09/2010   Lipoma of arm s/p excision 01/29/2014 01/10/2010   DEPRESSION 12/27/2009   BACK PAIN WITH RADICULOPATHY 12/27/2009   CYSTITIS, ACUTE 10/21/2009   MICROSCOPIC HEMATURIA 10/18/2009   KNEE PAIN, BILATERAL 10/18/2009   NEUROPATHY 09/08/2009   Asthma 09/08/2009   STRESS INCONTINENCE 09/08/2009   CHEST PAIN 07/19/2009   ESOPHAGEAL STRICTURE 08/27/2005    JBethena Midget7/03/2021, 11:05 AM  [facility] [address]Phone: [phone]  Fax:  [phone] Name: BMAYSA LYNNMRN: [id number] of Birth: 106/04/1951

## 2021-03-10 ENCOUNTER — Ambulatory Visit (INDEPENDENT_AMBULATORY_CARE_PROVIDER_SITE_OTHER): Payer: Medicare Other | Admitting: Orthopaedic Surgery

## 2021-03-10 ENCOUNTER — Other Ambulatory Visit: Payer: Self-pay

## 2021-03-10 VITALS — BP 146/77 | HR 88

## 2021-03-10 DIAGNOSIS — Z9889 Other specified postprocedural states: Secondary | ICD-10-CM | POA: Diagnosis not present

## 2021-03-12 DIAGNOSIS — Z794 Long term (current) use of insulin: Secondary | ICD-10-CM | POA: Diagnosis not present

## 2021-03-12 DIAGNOSIS — E118 Type 2 diabetes mellitus with unspecified complications: Secondary | ICD-10-CM | POA: Diagnosis not present

## 2021-03-12 NOTE — Progress Notes (Signed)
Office Visit Note   Patient: [name]           Date of Birth: Jul 29, 1951           MRN: [id number] Visit Date: 03/10/2021              Requested by: [name], [address] [place],  [place] [address] PCP: [name], FNP   Assessment & Plan: Visit Diagnoses:  1. S/P lumbar laminectomy     Plan: Previous microdiscectomy right.L4-5  Patient's responded well with relief of her left quad and hip flexion weakness.  Therapy has been very effective and at this point no further work-up is needed.  MRI scan no evidence of significant compression consistent with her symptoms.  Return as needed.  She is happy with results of treatment.  Follow-Up Instructions: No follow-ups on file.   Orders:  No orders of the defined types were placed in this encounter.  No orders of the defined types were placed in this encounter.     Procedures: No procedures performed   Clinical Data: No additional findings.   Subjective: Chief Complaint  Patient presents with   Lower Back - Follow-up    HPI patient returns states she is actually doing better feel like therapy made a big difference with her activity.  States leg has resolved 89% plus.  MRI scan is reviewed today it does show some narrowing on the right side opposite from her previous left hip flexion and quad weakness.  She is walking better.  Sleeping better.  Review of Systems 14 point system update unchanged from 02/06/2021 office visit.   Objective: Vital Signs: BP (!) 146/77   Pulse 88   LMP  (LMP Unknown)   Physical Exam Constitutional:      Appearance: She is well-developed.  HENT:     Head: Normocephalic.     Right Ear: External ear normal.     Left Ear: External ear normal. There is no impacted cerumen.  Eyes:     Pupils: Pupils are equal, round, and reactive to light.  Neck:     Thyroid: No thyromegaly.     Trachea: No tracheal deviation.  Cardiovascular:     Rate and Rhythm:  Normal rate.  Pulmonary:     Effort: Pulmonary effort is normal.  Abdominal:     Palpations: Abdomen is soft.  Musculoskeletal:     Cervical back: No rigidity.  Skin:    General: Skin is warm and dry.  Neurological:     Mental Status: She is alert and oriented to person, place, and time.  Psychiatric:        Behavior: Behavior normal.    Ortho Exam patient has normal gait gets from sitting standing comfortably.  Slight quad weakness on the left side.  Negative logroll of the hips.  Pulses are normal.  Specialty Comments:  No specialty comments available.  Imaging: Narrative & Impression  CLINICAL DATA:  Severe left quadriceps weakness for 2 months. Status post L4-5 microdiscectomy in 01/2020.   EXAM: MRI LUMBAR SPINE WITHOUT AND WITH CONTRAST   TECHNIQUE: Multiplanar and multiecho pulse sequences of the lumbar spine were obtained without and with intravenous contrast.   CONTRAST:  65mL MULTIHANCE GADOBENATE DIMEGLUMINE 529 MG/ML IV SOLN   COMPARISON:  07/30/2020   FINDINGS: Segmentation:  Standard.   Alignment:  Unchanged trace anterolisthesis of L4 on L5.   Vertebrae: No fracture, suspicious marrow lesion, or evidence of discitis. Decreased facet  edema at L4-5.   Conus medullaris and cauda equina: Conus extends to the lower L1 level. Conus and cauda equina appear normal.   Paraspinal and other soft tissues: Postoperative changes in the posterior lumbar soft tissues. No fluid collection.   Disc levels:   T11-12: Only imaged sagittally. Disc desiccation and mild disc space narrowing. Mild disc bulging and facet arthrosis without stenosis, unchanged.   T12-L1 through L2-3: Negative.   L3-4: Trace disc bulging and mild facet and ligamentum flavum hypertrophy without stenosis, unchanged.   L4-5: Disc desiccation and mild disc space narrowing. Prior right laminectomy. Disc bulging, a right paracentral to right foraminal disc protrusion, and moderate facet and  residual ligamentum flavum hypertrophy result in moderate right lateral recess and severe right and mild left neural foraminal stenosis, unchanged. No significant generalized spinal stenosis.   L5-S1: Disc desiccation and moderate disc space narrowing. Disc bulging, a shallow right paracentral disc protrusion, and mild facet and ligamentum flavum hypertrophy result in mild right lateral recess stenosis and mild right and moderate left neural foraminal stenosis without spinal stenosis, unchanged.   IMPRESSION: 1. Unchanged lumbar disc and facet degeneration 2. Moderate right lateral recess and severe right neural foraminal stenosis at L4-5 due to a disc protrusion. 3. Moderate left neural foraminal stenosis at L5-S1.     Electronically Signed   By: Logan Bores M.D.   On: 02/14/2021 16:45     PMFS History: Patient Active Problem List   Diagnosis Date Noted   Quadriceps weakness 02/06/2021   Syncope 11/29/2020   Mild aortic stenosis 11/29/2020   Irritable bowel syndrome with diarrhea 10/10/2020   Gastroesophageal reflux disease with esophagitis 10/10/2020   Postlaminectomy syndrome, not elsewhere classified 07/03/2020   S/P lumbar laminectomy 03/30/2020   Localized swelling, mass and lump, multiple sites 03/27/2018   Type II diabetes mellitus with renal manifestations ([place]) 12/27/2017   HLD (hyperlipidemia) 12/27/2017   HTN (hypertension) 12/27/2017   CKD (chronic kidney disease), stage III ([place]) 12/27/2017   Cough variant asthma 12/01/2015   Osteoarthritis of left knee, primary localized 08/17/2014   Knee osteoarthritis 08/17/2014   Hypoglycemia 11/15/2013   Other and unspecified hyperlipidemia 08/06/2013   Type II diabetes mellitus, uncontrolled ([place]) 07/20/2013   Varicose veins of lower extremities with other complications 02/20/9484   Fibromyalgia    GERD 05/18/2010   ABDOMINAL PAIN, GENERALIZED 05/02/2010   OBESITY 04/20/2010   BURSITIS, LEFT SHOULDER 03/09/2010    Lipoma of arm s/p excision 01/29/2014 01/10/2010   DEPRESSION 12/27/2009   BACK PAIN WITH RADICULOPATHY 12/27/2009   CYSTITIS, ACUTE 10/21/2009   MICROSCOPIC HEMATURIA 10/18/2009   KNEE PAIN, BILATERAL 10/18/2009   NEUROPATHY 09/08/2009   Asthma 09/08/2009   STRESS INCONTINENCE 09/08/2009   CHEST PAIN 07/19/2009   ESOPHAGEAL STRICTURE 08/27/2005   Past Medical History:  Diagnosis Date   Aortic stenosis    mild on 04/07/19 echo   Arthritis    L knee, hands, back    Asthma    Diabetes mellitus type 2, insulin dependent (HCC)    Fibromyalgia    Full dentures    Full dentures    GERD (gastroesophageal reflux disease)    H/O hiatal hernia    Headache    h/o migraines - was followed for a time with a wellness doctor   History of esophageal dilatation    History of rhabdomyolysis    03/ 2015   Hyperlipidemia    Hypertension    Insulin pump in place  since 12/ 2014--  [employer]   Lumbar disc herniation    right leg weakness   Osteoarthritis of left knee, primary localized 08/17/2014   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   Renal insufficiency    Rhabdomyolysis 11/15/2013   SUI (stress urinary incontinence, female)    Syncope 12/27/2017   Wears glasses    Wears glasses     Family History  Problem Relation Age of Onset   Diabetes Mother    Hypertension Mother    Lung cancer Mother        smoked   Diabetes Father    Hypertension Father    Lung cancer Father        smoked   Diabetes Brother    Heart failure Sister    Diabetes Maternal Grandmother    Cancer Maternal Grandmother     Past Surgical History:  Procedure Laterality Date   BLADDER SUSPENSION N/A 03/09/2014   Procedure: CYSTOSCOPY/SLING;  Surgeon: Reece Packer, MD;  Location: [facility];  Service: Urology;  Laterality: N/A;   CARDIAC CATHETERIZATION  07-20-2009   DR [place]  NORMAL  LVEF/  NORMAL CORONARY AND RENAL ARTERIES   CARPAL TUNNEL RELEASE Right 2000   CHOLECYSTECTOMY   1990   COLONOSCOPY WITH ESOPHAGOGASTRODUODENOSCOPY (EGD)  06-18-2002   ESOPHAGOGASTRODUODENOSCOPY (EGD) WITH ESOPHAGEAL DILATION  06-12-2010   EXCISION LEFT UPPER ARM MASS  01-29-2014   EYE SURGERY  2010   laser on R eye   INTERSTIM IMPLANT REMOVAL N/A 03/06/2019   Procedure: REMOVAL OF Barrie Lyme IMPLANT;  Surgeon: Bjorn Loser, MD;  [facility];  Service: Urology;  Laterality: N/A;   KNEE ARTHROSCOPY Right 1989  &  2002   LUMBAR LAMINECTOMY N/A 02/19/2020   Procedure: right L4 hemiaminectomy, microdiscectomy;  Surgeon: Marybelle Killings, MD;  Location: [facility];  Service: Orthopedics;  Laterality: N/A;   MULTIPLE TOOTH EXTRACTIONS     NEGATIVE SLEEP STUDY  2012   PER PT   PARTIAL KNEE ARTHROPLASTY Left 08/17/2014   Procedure: LEFT UNICOMPARTMENTAL KNEE;  Surgeon: Johnny Bridge, MD;  Location: [facility];  Service: Orthopedics;  Laterality: Left;   Social History   Occupational History   Occupation: disability  Tobacco Use   Smoking status: Never   Smokeless tobacco: Never  Vaping Use   Vaping Use: Never used  Substance and Sexual Activity   Alcohol use: No   Drug use: No   Sexual activity: Not Currently

## 2021-03-13 ENCOUNTER — Telehealth: Payer: Self-pay

## 2021-03-13 NOTE — Chronic Care Management (AMB) (Signed)
Completed patient's no fill report on humalog. Patient received medication on 03-02-2021.  [place] Pharmacist Assistant [phone]

## 2021-03-15 ENCOUNTER — Telehealth: Payer: Self-pay

## 2021-03-15 NOTE — Chronic Care Management (AMB) (Signed)
Chronic Care Management Pharmacy Assistant   Name: [name]  MRN: [id number] DOB: 06-Sep-1950   Reason for Encounter: Disease State/ Hypertension   Recent office visits:  None  Recent consult visits:  02-28-2021 Erenest Rasher, PT (Rehab). No changes  03-03-2021 Bethena Midget, PT (Rehab). No changes  Hospital visits:  Medication Reconciliation was completed by comparing discharge summary, patient's EMR and Pharmacy list, and upon discussion with patient.  Admitted to the hospital on 11-29-2020 due to Syncope. Discharge date was 12-01-2020. Discharged from [place]?Medications Started at [facility] Discharge:?? Pantoprazole 40 mg daily  Medication Changes at Hospital Discharge: None  Medications Discontinued at Hospital Discharge: Spironolactone 50 mg Humalog 100 units  Medications that remain the same after Hospital Discharge:??  -All other medications will remain the same.    Medications: Outpatient Encounter Medications as of 03/15/2021  Medication Sig   amLODipine (NORVASC) 5 MG tablet Take 1 tablet (5 mg total) by mouth at bedtime.   aspirin EC 81 MG EC tablet Take 1 tablet (81 mg total) by mouth daily. Swallow whole.   blood glucose meter kit and supplies KIT Dispense based on patient and insurance preference. Check blood sugars 4 times daily E11.69   Continuous Blood Gluc Receiver (FREESTYLE LIBRE READER) DEVI 1 each by Does not apply route See admin instructions. CGM device-Freestyle Libre   Continuous Blood Gluc Sensor (FREESTYLE LIBRE 14 DAY SENSOR) MISC 1 each by Does not apply route See admin instructions. CGM 14-day sensors; send refills to [facility] (in Epic)   FARXIGA 10 MG TABS tablet TAKE ONE TABLET BY MOUTH BEFORE BREAKFAST (Patient taking differently: Take 10 mg by mouth daily.)   HUMALOG 100 UNIT/ML injection Inject 10 Units into the skin daily. Adjust with sliding scale   insulin degludec (TRESIBA FLEXTOUCH) 100  UNIT/ML FlexTouch Pen Inject 15 Units into the skin daily.   Insulin Pen Needle (BD PEN NEEDLE MICRO U/F) 32G X 6 MM MISC DX:E11.65 USE TO CHECK BLOOD SUGARS THREE TIMES A DAY   Lancets Misc. MISC 1 each by Does not apply route 4 (four) times daily -  with meals and at bedtime.   linaclotide (LINZESS) 145 MCG CAPS capsule Take 1 capsule (145 mcg total) by mouth daily before breakfast.   mirabegron ER (MYRBETRIQ) 50 MG TB24 tablet Take 1 tablet (50 mg total) by mouth daily.   Multiple Vitamin (MULTIVITAMIN WITH MINERALS) TABS tablet Take 1 tablet by mouth daily.   ONETOUCH VERIO test strip USE TO test blood sugar FOUR TIMES DAILY   pantoprazole (PROTONIX) 40 MG tablet Take 1 tablet (40 mg total) by mouth daily.   pravastatin (PRAVACHOL) 40 MG tablet TAKE ONE TABLET BY MOUTH EVERYDAY AT BEDTIME (Patient taking differently: Take 40 mg by mouth daily.)   pregabalin (LYRICA) 50 MG capsule Take 1 capsule (50 mg total) by mouth 3 (three) times daily. (Patient taking differently: Take 50 mg by mouth daily.)   spironolactone (ALDACTONE) 50 MG tablet Take 50 mg by mouth every morning.   Vitamin D, Ergocalciferol, (DRISDOL) 1.25 MG (50000 UNIT) CAPS capsule TAKE ONE CAPSULE BY MOUTH ONCE WEEKLY ON WEDNESDAY   No facility-administered encounter medications on file as of 03/15/2021.   Reviewed chart prior to disease state call. Spoke with patient regarding BP  Recent Office Vitals: BP Readings from Last 3 Encounters:  03/10/21 (!) 146/77  02/06/21 (!) 156/81  01/25/21 (!) 180/76   Pulse Readings from Last 3  Encounters:  03/10/21 88  02/06/21 88  01/25/21 99    Wt Readings from Last 3 Encounters:  02/06/21 146 lb (66.2 kg)  01/25/21 146 lb (66.2 kg)  01/11/21 146 lb (66.2 kg)     Kidney Function Lab Results  Component Value Date/Time   CREATININE 1.22 (H) 12/13/2020 03:32 PM   CREATININE 1.04 (H) 12/01/2020 08:59 AM   GFR 51.07 (L) 05/17/2017 08:54 AM   GFRNONAA 59 (L) 12/01/2020 08:59 AM    GFRAA 57 (L) 07/07/2020 05:11 PM    BMP Latest Ref Rng & Units 12/13/2020 12/01/2020 11/30/2020  Glucose 65 - 99 mg/dL 224(H) 170(H) 166(H)  BUN 8 - 27 mg/dL 8 10 17   Creatinine 0.57 - 1.00 mg/dL 1.22(H) 1.04(H) 1.17(H)  BUN/Creat Ratio 12 - 28 7(L) - -  Sodium 134 - 144 mmol/L 143 139 138  Potassium 3.5 - 5.2 mmol/L 4.2 3.8 3.5  Chloride 96 - 106 mmol/L 100 104 103  CO2 20 - 29 mmol/L 24 29 27   Calcium 8.7 - 10.3 mg/dL 9.8 8.7(L) 9.4    Current antihypertensive regimen:  Amlodipine 5 mg daily  How often are you checking your Blood Pressure? daily  Current home BP readings: 143/78  What recent interventions/DTPs have been made by any provider to improve Blood Pressure control since last CPP Visit:  Daily salt intake goal < 2300 mg; Importance of home blood pressure monitoring; Proper BP monitoring technique Counseled to monitor BP at home four times per week, document, and provide log at future appointments  Any recent hospitalizations or ED visits since last visit with CPP? Yes  What diet changes have been made to improve Blood Pressure Control?  Patient states she has limited her salt intake, doesn't eat fast food, eats plenty of fruits/ vegetables and protein.   What exercise is being done to improve your Blood Pressure Control?  Patient states she exercises on her elliptical machine for 30 minutes. She states she walks and goes to the [facility].  Adherence Review: Is the patient currently on ACE/ARB medication? No Does the patient have >5 day gap between last estimated fill dates? No  NOTES: Patient states since she started back exercising and her blood sugars have came down. Patient wants to make a follow up visit with [name], FNP and wants to know should she discontinue the Ozempic because she is on her last injection. Sent message to [facility] CMA.  Care Gaps: Covid booster overdue Ophthalmology exam overdue Foot exam overdue Shingrix overdue RAF= 2.612% Medicare  wellness 01-14-2022  Star Rating Drugs: Pravastatin 40 mg- Last filled 03-02-2021 [facility] Farxiga 10 mg- Last filled 03-02-2021 [facility]   [place] Clinical Pharmacist Assistant [phone]

## 2021-03-16 ENCOUNTER — Telehealth: Payer: Self-pay

## 2021-03-29 ENCOUNTER — Telehealth: Payer: Self-pay

## 2021-03-29 NOTE — Chronic Care Management (AMB) (Addendum)
Chronic Care Management Pharmacy Assistant   Name: [name]  MRN: [id number] DOB: 03-Apr-1951  Reason for Encounter: Medication Review/ Medication Coordination    Recent office visits:  None  Recent consult visits:  None  Hospital visits:  Medication Reconciliation was completed by comparing discharge summary, patient's EMR and Pharmacy list, and upon discussion with patient.   Admitted to the hospital on 11-29-2020 due to Syncope. Discharge date was 12-01-2020. Discharged from [place]?Medications Started at [facility] Discharge:?? Pantoprazole 40 mg daily   Medication Changes at Hospital Discharge: None   Medications Discontinued at Hospital Discharge: Spironolactone 50 mg Humalog 100 units   Medications that remain the same after Hospital Discharge:?? -All other medications will remain the same.    Medications: Outpatient Encounter Medications as of 03/29/2021  Medication Sig   amLODipine (NORVASC) 5 MG tablet Take 1 tablet (5 mg total) by mouth at bedtime.   aspirin EC 81 MG EC tablet Take 1 tablet (81 mg total) by mouth daily. Swallow whole.   blood glucose meter kit and supplies KIT Dispense based on patient and insurance preference. Check blood sugars 4 times daily E11.69   Continuous Blood Gluc Receiver (FREESTYLE LIBRE READER) DEVI 1 each by Does not apply route See admin instructions. CGM device-Freestyle Libre   Continuous Blood Gluc Sensor (FREESTYLE LIBRE 14 DAY SENSOR) MISC 1 each by Does not apply route See admin instructions. CGM 14-day sensors; send refills to [employer] [facility] (in Epic)   FARXIGA 10 MG TABS tablet TAKE ONE TABLET BY MOUTH BEFORE BREAKFAST (Patient taking differently: Take 10 mg by mouth daily.)   HUMALOG 100 UNIT/ML injection Inject 10 Units into the skin daily. Adjust with sliding scale   insulin degludec (TRESIBA FLEXTOUCH) 100 UNIT/ML FlexTouch Pen Inject 15 Units into the skin daily.   Insulin Pen Needle  (BD PEN NEEDLE MICRO U/F) 32G X 6 MM MISC DX:E11.65 USE TO CHECK BLOOD SUGARS THREE TIMES A DAY   Lancets Misc. MISC 1 each by Does not apply route 4 (four) times daily -  with meals and at bedtime.   linaclotide (LINZESS) 145 MCG CAPS capsule Take 1 capsule (145 mcg total) by mouth daily before breakfast.   mirabegron ER (MYRBETRIQ) 50 MG TB24 tablet Take 1 tablet (50 mg total) by mouth daily.   Multiple Vitamin (MULTIVITAMIN WITH MINERALS) TABS tablet Take 1 tablet by mouth daily.   ONETOUCH VERIO test strip USE TO test blood sugar FOUR TIMES DAILY   pantoprazole (PROTONIX) 40 MG tablet Take 1 tablet (40 mg total) by mouth daily.   pravastatin (PRAVACHOL) 40 MG tablet TAKE ONE TABLET BY MOUTH EVERYDAY AT BEDTIME (Patient taking differently: Take 40 mg by mouth daily.)   pregabalin (LYRICA) 50 MG capsule Take 1 capsule (50 mg total) by mouth 3 (three) times daily. (Patient taking differently: Take 50 mg by mouth daily.)   spironolactone (ALDACTONE) 50 MG tablet Take 50 mg by mouth every morning.   Vitamin D, Ergocalciferol, (DRISDOL) 1.25 MG (50000 UNIT) CAPS capsule TAKE ONE CAPSULE BY MOUTH ONCE WEEKLY ON WEDNESDAY   No facility-administered encounter medications on file as of 03/29/2021.   Reviewed chart for medication changes ahead of medication coordination call.  No OVs, Consults, or hospital visits since last care coordination call/Pharmacist visit. (If appropriate, list visit date, provider name)  No medication changes indicated OR if recent visit, treatment plan here.  BP Readings from Last 3 Encounters:  03/10/21 [name])  146/77  02/06/21 (!) 156/81  01/25/21 (!) 180/76    Lab Results  Component Value Date   HGBA1C 10.4 (H) 11/29/2020     Patient obtains medications through Adherence Packaging  30 Days   Last adherence delivery included:  Women's 50 Plus Multivitamin- 1 tablet daily (breakfast) Ergocalciferol 50,000 units- 1 capsule weekly on Wednesdays (breakfast) Farxiga 10  gm- 1 tablet daily (before breakfast) Pravastatin 40 mg- 1 tablet daily (bedtime) Linzess 145 mcg- 1 tablet daily(before breakfast) Amlodipine 5 mg- 1 tablet daily (bedtime) Pantoprazole 40 mg- 1 tablet daily (breakfast) Lyrica 50 mg- 1 tablet three times a day (breakfast, lunch, bedtime) Humalog Kwik Injection pen 25 units  Patient declined (meds) last month: None  Patient is due for next adherence delivery on: 04-06-2021  Called patient and reviewed medications and coordinated delivery.  This delivery to include: Women's 50 Plus Multivitamin- 1 tablet daily (breakfast) Ergocalciferol 50,000 units- 1 capsule weekly on Wednesdays (breakfast) Farxiga 5 gm- 2 tablets daily (before breakfast) Pravastatin 40 mg- 1 tablet daily (bedtime) Linzess 145 mcg- 1 tablet daily(before breakfast) Amlodipine 5 mg- 1 tablet daily (bedtime) Pantoprazole 40 mg- 1 tablet daily (breakfast) Humalog Kwik Injection pen 25 units  No acute/ short fill needed  Patient declined the following medications: None  Patient needs refills for: None  Confirmed delivery date of 04-06-2021 advised patient that pharmacy will contact them the morning of delivery.  Notes: Patient has requested farxiga 5 mg twice daily instead of 10 mg due to her difficulty of swallowing. Informed pharmacy.  Care Gaps: Covid booster overdue Ophthalmology exam overdue Foot exam overdue Shingrix overdue RAF= 2.612% Medicare wellness 01-14-2022  Star Rating Drugs: Pravastatin 40 mg- Last filled 03-02-2021 [facility] Farxiga 10 mg- Last filled 03-02-2021 [facility]   [place] Clinical Pharmacist Assistant [phone]  I have reviewed the care management and care coordination activities outlined in this encounter and I am certifying that I agree with the content of this note.  Reviewed patients NCIR profile determined patients had Shingrix vaccine complete 11/28/2016 and 02/11/2017 -will request that this  information is added to patient chart.   Mayford Knife, [facility] 03/30/21 9:28 AM

## 2021-03-30 DIAGNOSIS — N3020 Other chronic cystitis without hematuria: Secondary | ICD-10-CM | POA: Diagnosis not present

## 2021-04-04 ENCOUNTER — Other Ambulatory Visit: Payer: Self-pay

## 2021-04-04 ENCOUNTER — Telehealth: Payer: Medicare Other

## 2021-04-04 ENCOUNTER — Ambulatory Visit (INDEPENDENT_AMBULATORY_CARE_PROVIDER_SITE_OTHER): Payer: Medicare Other

## 2021-04-04 DIAGNOSIS — E1165 Type 2 diabetes mellitus with hyperglycemia: Secondary | ICD-10-CM

## 2021-04-04 DIAGNOSIS — Z794 Long term (current) use of insulin: Secondary | ICD-10-CM | POA: Diagnosis not present

## 2021-04-04 DIAGNOSIS — I10 Essential (primary) hypertension: Secondary | ICD-10-CM

## 2021-04-04 DIAGNOSIS — E118 Type 2 diabetes mellitus with unspecified complications: Secondary | ICD-10-CM | POA: Diagnosis not present

## 2021-04-04 DIAGNOSIS — E782 Mixed hyperlipidemia: Secondary | ICD-10-CM

## 2021-04-04 DIAGNOSIS — Z9889 Other specified postprocedural states: Secondary | ICD-10-CM

## 2021-04-04 MED ORDER — DAPAGLIFLOZIN PROPANEDIOL 5 MG PO TABS: 5.0000 mg | ORAL_TABLET | Freq: Two times a day (BID) | ORAL | 2 refills | Status: DC

## 2021-04-04 NOTE — Chronic Care Management (AMB) (Signed)
Chronic Care Management   CCM RN Visit Note  04/04/2021 Name: [name] MRN: [id number] DOB: December 04, 1950  Subjective: [name] is a 70 y.o. year old female who is a primary care patient of [name], [place]. The care management team was consulted for assistance with disease management and care coordination needs.    Engaged with patient by telephone for follow up visit in response to provider referral for case management and/or care coordination services.   Consent to Services:  The patient was given information about Chronic Care Management services, agreed to services, and gave verbal consent prior to initiation of services.  Please see initial visit note for detailed documentation.   Patient agreed to services and verbal consent obtained.   Assessment: Review of patient past medical history, allergies, medications, health status, including review of consultants reports, laboratory and other test data, was performed as part of comprehensive evaluation and provision of chronic care management services.   SDOH (Social Determinants of Health) assessments and interventions performed:    CCM Care Plan  Allergies  Allergen Reactions   Pollen Extract Other (See Comments)    Congestion/sneezing   Tomato Hives and Itching   Codeine Hives, Itching and Nausea And Vomiting    Outpatient Encounter Medications as of 04/04/2021  Medication Sig   amLODipine (NORVASC) 5 MG tablet Take 1 tablet (5 mg total) by mouth at bedtime.   aspirin EC 81 MG EC tablet Take 1 tablet (81 mg total) by mouth daily. Swallow whole.   blood glucose meter kit and supplies KIT Dispense based on patient and insurance preference. Check blood sugars 4 times daily E11.69   Continuous Blood Gluc Receiver (FREESTYLE LIBRE READER) DEVI 1 each by Does not apply route See admin instructions. CGM device-Freestyle Libre   Continuous Blood Gluc Sensor (FREESTYLE LIBRE 14 DAY SENSOR) MISC 1 each by Does not  apply route See admin instructions. CGM 14-day sensors; send refills to [employer] [facility] (in Epic)   dapagliflozin propanediol (FARXIGA) 5 MG TABS tablet Take 1 tablet (5 mg total) by mouth 2 (two) times daily.   HUMALOG 100 UNIT/ML injection Inject 10 Units into the skin daily. Adjust with sliding scale   insulin degludec (TRESIBA FLEXTOUCH) 100 UNIT/ML FlexTouch Pen Inject 15 Units into the skin daily.   Insulin Pen Needle (BD PEN NEEDLE MICRO U/F) 32G X 6 MM MISC DX:E11.65 USE TO CHECK BLOOD SUGARS THREE TIMES A DAY   Lancets Misc. MISC 1 each by Does not apply route 4 (four) times daily -  with meals and at bedtime.   linaclotide (LINZESS) 145 MCG CAPS capsule Take 1 capsule (145 mcg total) by mouth daily before breakfast.   mirabegron ER (MYRBETRIQ) 50 MG TB24 tablet Take 1 tablet (50 mg total) by mouth daily.   Multiple Vitamin (MULTIVITAMIN WITH MINERALS) TABS tablet Take 1 tablet by mouth daily.   ONETOUCH VERIO test strip USE TO test blood sugar FOUR TIMES DAILY   pantoprazole (PROTONIX) 40 MG tablet Take 1 tablet (40 mg total) by mouth daily.   pravastatin (PRAVACHOL) 40 MG tablet TAKE ONE TABLET BY MOUTH EVERYDAY AT BEDTIME (Patient taking differently: Take 40 mg by mouth daily.)   pregabalin (LYRICA) 50 MG capsule Take 1 capsule (50 mg total) by mouth 3 (three) times daily. (Patient taking differently: Take 50 mg by mouth daily.)   spironolactone (ALDACTONE) 50 MG tablet Take 50 mg by mouth every morning.   Vitamin D, Ergocalciferol, (DRISDOL) 1.25 MG (50000 UNIT)  CAPS capsule TAKE ONE CAPSULE BY MOUTH ONCE WEEKLY ON WEDNESDAY   No facility-administered encounter medications on file as of 04/04/2021.    Patient Active Problem List   Diagnosis Date Noted   Quadriceps weakness 02/06/2021   Syncope 11/29/2020   Mild aortic stenosis 11/29/2020   Irritable bowel syndrome with diarrhea 10/10/2020   Gastroesophageal reflux disease with esophagitis 10/10/2020   Postlaminectomy syndrome,  not elsewhere classified 07/03/2020   S/P lumbar laminectomy 03/30/2020   Localized swelling, mass and lump, multiple sites 03/27/2018   Type II diabetes mellitus with renal manifestations ([place]) 12/27/2017   HLD (hyperlipidemia) 12/27/2017   HTN (hypertension) 12/27/2017   CKD (chronic kidney disease), stage III ([place]) 12/27/2017   Cough variant asthma 12/01/2015   Osteoarthritis of left knee, primary localized 08/17/2014   Knee osteoarthritis 08/17/2014   Hypoglycemia 11/15/2013   Other and unspecified hyperlipidemia 08/06/2013   Type II diabetes mellitus, uncontrolled ([place]) 07/20/2013   Varicose veins of lower extremities with other complications 35/45/6256   Fibromyalgia    GERD 05/18/2010   ABDOMINAL PAIN, GENERALIZED 05/02/2010   OBESITY 04/20/2010   BURSITIS, LEFT SHOULDER 03/09/2010   Lipoma of arm s/p excision 01/29/2014 01/10/2010   DEPRESSION 12/27/2009   BACK PAIN WITH RADICULOPATHY 12/27/2009   CYSTITIS, ACUTE 10/21/2009   MICROSCOPIC HEMATURIA 10/18/2009   KNEE PAIN, BILATERAL 10/18/2009   NEUROPATHY 09/08/2009   Asthma 09/08/2009   STRESS INCONTINENCE 09/08/2009   CHEST PAIN 07/19/2009   ESOPHAGEAL STRICTURE 08/27/2005    Conditions to be addressed/monitored: DM, HTN, HLD, s/p laminectomy   Care Plan : Manage low back pain  Updates made by [name], RN since 04/04/2021 12:00 AM  Completed 04/04/2021   Problem: Chronic Low Back Pain Resolved 04/04/2021  Priority: High     Long-Range Goal: Chronic Low Back Pain Managed Completed 04/04/2021  Start Date: 08/02/2020  Expected End Date: 08/02/2021  Recent Progress: On track  Priority: High  Note:   Current Barriers:  Ineffective Self Health Maintenance Currently UNABLE TO independently self manage needs related to chronic health conditions.  Knowledge Deficits related to short term plan for care coordination needs and long term plans for chronic disease management needs Nurse Case Manager Clinical Goal(s):  Over  the next 180 days, patient will work with care management team to address care coordination and chronic disease management needs related to Disease Management Educational Needs Care Coordination Medication Management and Education Psychosocial Support   Interventions:  04/04/21 completed outbound successful call with patient  Collaboration with [name] FNP regarding development and update of comprehensive plan of care as evidenced by provider attestation and co-signature Inter-disciplinary care team collaboration (see longitudinal plan of care) Provided education to patient about basic disease process Review of patient status, including review of consultant's reports, relevant laboratory and other test results, and medications completed. Reviewed medications with patient and discussed importance of medication adherence Encouraged use of multimodal interventions, such as physical therapy, distraction,  relaxation, and or application of heat or cold.  Evaluated pain level, efficacy, tolerability and adherence to pain management plan.  Assessed if pain is associated with mobility or at rest, location, intensity, frequency, duration, recurrence, pattern and description (e.g., cramping, burning, aching), triggers and relieving factors.   Reviewed and discussed the following Assessment/Plan per Dr. Rodell Perna, Orthopedic Surgeon from 02/06/21: Assessment & Plan: Visit Diagnoses:  1. Pain in left leg  2. S/P lumbar laminectomy  3. Quadriceps weakness   Plan: Patient needs new lumbar MRI to  rule out compression on the left side that could be consistent with hip flexion and quadriceps weakness on the left.  Epidural hematoma versus synovial facet cyst or large disc herniation are in the differential diagnosis.  She might have an iliopsoas hematoma, spontaneous.  Follow-up after MRI scan if this is negative then we will have to consider EMGs nerve conduction velocities.   Follow-Up Instructions: No  follow-ups on file.    Orders:     Orders Placed This Encounter  Procedures   MR Lumbar Spine W Wo Contrast    No orders of the defined types were placed in this encounter.   Reinforced the importance to continue performing her HEP as directed and to keep Dr. Lorin Mercy informed of new or worsening symptoms Discussed plans with patient for ongoing care management follow up and provided patient with direct contact information for care management team Patient Goals/Self Care/Activities: - call for medicine refill 2 or 3 days before it runs out - plan exercise or activity when pain is best controlled - prioritize tasks for the day - stay active - track times pain is worst and when it is best - track what makes the pain worse and what makes it better - work slower and less intense when having pain   Follow Up Plan: Follow up with provider directed    Problem: Functional Decline Resolved 04/04/2021  Priority: High     Long-Range Goal: Maintain Mobility and Function Completed 04/04/2021  Start Date: 08/02/2020  Expected End Date: 01/31/2021  Recent Progress: On track  Priority: High  Note:   Current Barriers:  Ineffective Self Health Maintenance Currently UNABLE TO independently self manage needs related to chronic health conditions.  Knowledge Deficits related to short term plan for care coordination needs and long term plans for chronic disease management needs Nurse Case Manager Clinical Goal(s):  Over the next 180 days, patient will work with care management team to address care coordination and chronic disease management needs related to Disease Management Educational Needs Care Coordination Medication Management and Education Psychosocial Support   Interventions: 04/04/21 completed successful outbound call with patient  Provided education to patient about basic disease process related chronic back pain  Review of patient status, including review of consultant's reports, relevant  laboratory and other test results, and medications completed. Reviewed medications with patient and discussed importance of medication adherence Determined patient completed her outpatient PT with good effectiveness, she is following her HEP and ambulating without the use of DME Assessed for DME needs, patient is using a cane when needed or when walking on uneven ground  Discussed plans with patient for ongoing care management follow up and provided patient with direct contact information for care management team Patient Goals/Self Care/Activities: - plan exercise or activity when pain is best controlled - prioritize tasks for the day - stay active - check with my doctor before adding more exercise than I usually do - choose a type of activity I enjoy  - work slower and less intense when having pain   Follow Up Plan: Follow up with provider in 1 year per MD recommendations      Care Plan : Manage Diabetes Type 2 (Adult)  Updates made by [name], RN since 04/04/2021 12:00 AM     Problem: Glycemic Management (Diabetes, Type 2)   Priority: High     Long-Range Goal: Glycemic Management Optimized   Start Date: 08/02/2020  Expected End Date: 08/02/2021  Recent Progress: On track  Priority:  High  Note:   Objective:  Lab Results  Component Value Date   HGBA1C 10.4 (H) 11/29/2020   Lab Results  Component Value Date   CREATININE 1.22 (H) 12/13/2020   CREATININE 1.04 (H) 12/01/2020   CREATININE 1.17 (H) 11/30/2020   Lab Results  Component Value Date   EGFR 48 (L) 12/13/2020  Current Barriers:  Knowledge Deficits related to basic Diabetes pathophysiology and self care/management Knowledge Deficits related to medications used for management of diabetes Case Manager Clinical Goal(s):  Patient will demonstrate improved adherence to prescribed treatment plan for diabetes self care/management as evidenced by:  daily monitoring and recording of CBG  adherence to ADA/ carb  modified diet exercise 3-5 days/week adherence to prescribed medication regimen Interventions:  04/04/21 completed successful outbound call with patient  Collaboration with [name] FNP regarding development and update of comprehensive plan of care as evidenced by provider attestation and co-signature Inter-disciplinary care team collaboration (see longitudinal plan of care) Provided education to patient about basic DM disease process Review of patient status, including review of consultants reports, relevant laboratory and other test results, and medications completed. Reviewed medications with patient and discussed importance of medication adherence Instructed patient to limit her carbohydrate intake and drink plenty of water; Stressed the importance of dietary adherence and following hypoglycemic home protocol if symptomatic  Advised patient, providing education and rationale, to check cbg daily before meals and at bedtime and record, calling the CCM team and or PCP for findings outside established parameters Reviewed scheduled/upcoming provider appointments including: next PCP follow up appointment scheduled for 05/03/21 @10 :45 AM  Mailed printed educational materials related to Low Carb Smoothies  Discussed plans with patient for ongoing care management follow up and provided patient with direct contact information for care management team Patient Goals/Self-Care Activities Patient will:  Self administers oral medications as prescribed Self administers insulin as prescribed Self administers injectable DM medication (Ozempic) as prescribed Attends all scheduled provider appointments Checks blood sugars as prescribed and utilize hyper and hypoglycemia protocol as needed Adheres to prescribed ADA/carb modified - pick up glucometer from pharmacy and resume monitoring cbg's promptly - follow hypoglycemic protocol as directed, if needed   - continue to adhere to Diabetic diet as directed    Follow Up Plan: Telephone follow up appointment with care management team member scheduled for: 05/05/21    Problem: Disease Progression (Diabetes, Type 2) Resolved 04/04/2021  Priority: High     Long-Range Goal: Disease Progression Prevented or Minimized Completed 04/04/2021  Start Date: 08/02/2020  Expected End Date: 08/02/2021  Recent Progress: On track  Priority: High  Note:   Objective:  Lab Results  Component Value Date   HGBA1C 10.4 (H) 11/29/2020   Lab Results  Component Value Date   CREATININE 1.22 (H) 12/13/2020   CREATININE 1.04 (H) 12/01/2020   CREATININE 1.17 (H) 11/30/2020   Lab Results  Component Value Date   EGFR 48 (L) 12/13/2020   Current Barriers:  Knowledge Deficits related to basic Diabetes pathophysiology and self care/management Case Manager Clinical Goal(s):  Over the next 180 days, patient will demonstrate improved adherence to prescribed treatment plan for diabetes self care/management as evidenced by:  daily monitoring and recording of CBG  adherence to ADA/ carb modified diet exercise 3-5 days/week adherence to prescribed medication regimen Interventions:  04/04/21 completed successful outbound call with patient  Collaboration with [name] FNP regarding development and update of comprehensive plan of care as evidenced by provider attestation and co-signature Inter-disciplinary care  team collaboration (see longitudinal plan of care) Provided education to patient about basic DM disease process Reviewed medications with patient and discussed importance of medication adherence Encouraged lifestyle changes, such as increased intake of plant-based foods, stress reduction, consistent physical activity to avoid long-term complications and chronic disease.   Determined patient has implemented a weekly routine exercise regimen and is attending the [facility] three times weekly  Determined she continues to monitor her CBG's as directed Discussed plans with  patient for ongoing care management follow up and provided patient with direct contact information for care management team Patient Goals/Self-Care Activities Over the next 180 days, patient will:  - Self administers oral medications as prescribed Self administers insulin as prescribed Self administers injectable DM medication (Ozempic) as prescribed Attends all scheduled provider appointments Checks blood sugars as prescribed and utilize hyper and hypoglycemia protocol as needed Adheres to prescribed ADA/carb modified  Follow Up Plan: Continue to follow MD recommendations for management of DM     Care Plan : Syncope  Updates made by [name], RN since 04/04/2021 12:00 AM  Completed 04/04/2021   Problem: Syncope Resolved 04/04/2021  Priority: High     Goal: Syncope - complications minimized or prevented Completed 04/04/2021  Start Date: 12/22/2020  Expected End Date: 12/22/2021  Recent Progress: On track  Priority: High  Note:   Current Barriers:  Ineffective Self Health Maintenance  Clinical Goal(s):  Collaboration with [name], FNP regarding development and update of comprehensive plan of care as evidenced by provider attestation and co-signature Inter-disciplinary care team collaboration (see longitudinal plan of care) patient will work with care management team to address care coordination and chronic disease management needs related to Disease Management Educational Needs Care Coordination Medication Management and Education Psychosocial Support   Interventions:  04/04/21 completed successful outbound call with patient  Evaluation of current treatment plan related to  DM, HTN, HLD, s/p laminectomy  , self-management and patient's adherence to plan as established by provider. Collaboration with [name], FNP regarding development and update of comprehensive plan of care as evidenced by provider attestation       and co-signature Inter-disciplinary care team  collaboration (see longitudinal plan of care) Review of patient status, including review of consultants reports, relevant laboratory and other test results, and medications completed. Reviewed medications with patient and discussed importance of medication adherence Determined patient has not experienced further Syncope episodes Instructed patient to continue monitoring cbg's and BP at home, reporting abnormal readings or concerns promptly to PCP and or RN CM Educated on importance of drinking 64 oz of water daily unless otherwise directed, balancing activity with rest and adhering to ADA diet Discussed plans with patient for ongoing care management follow up and provided patient with direct contact information for care management team Self Care Activities:  Continue to keep all scheduled follow up appointments Take medications as directed  Let your healthcare team know if you are unable to take your medications Call your pharmacy for refills at least 7 days prior to running out of medication Remember to increase water intake to 64 oz of water daily unless otherwise directed, balance activity with rest and adhere to ADA diet Continue monitor cbg's and BP at home, report abnormal readings or concerns promptly to PCP and or RN CM Patient Goals: - no hospitalization due to reoccuring syncope episodes    Plan:Telephone follow up appointment with care management team member scheduled for:  05/05/21  [name], RN, BSN, CCM Care Management Coordinator  [place] [facility]  Direct Phone: [phone]

## 2021-04-04 NOTE — Patient Instructions (Signed)
Goals Addressed      COMPLETED: Keep Low Back Pain Under Control       Timeframe:  Long-Range Goal Priority:  High Start Date: 08/02/20                            Expected End Date:  03/02/21                     Follow Up Date: 03/02/21   - call for medicine refill 2 or 3 days before it runs out - plan exercise or activity when pain is best controlled - prioritize tasks for the day - stay active - track times pain is worst and when it is best - track what makes the pain worse and what makes it better - work slower and less intense when having pain    Why is this important?   Day-to-day life can be hard when you have back pain.  Pain medicine is just one piece of the treatment puzzle. There are many things you can do to manage pain and keep your back strong.   Lifestyle changes, like stopping smoking and eating foods with Vitamin D and calcium, keep your bones and muscles healthy. Your back is better when it is supported by strong muscles.  You can try these action steps to help you manage your pain.     Notes:      Monitor and Manage My Blood Sugar-Diabetes Type 2   On track    Timeframe:  Long-Range Goal Priority:  High Start Date: 08/02/20                            Expected End Date: 08/02/21                     Follow Up Date: 05/05/21  - check blood sugar at prescribed times - check blood sugar before and after exercise - check blood sugar if I feel it is too high or too low - enter blood sugar readings and medication or insulin into daily log - take the blood sugar log to all doctor visits - take the blood sugar meter to all doctor visits    Why is this important?   Checking your blood sugar at home helps to keep it from getting very high or very low.  Writing the results in a diary or log helps the doctor know how to care for you.  Your blood sugar log should have the time, date and the results.  Also, write down the amount of insulin or other medicine that you take.   Other information, like what you ate, exercise done and how you were feeling, will also be helpful.     Notes:      Obtain Eye Exam-Diabetes Type 2       Timeframe:  Long-Range Goal Priority:  Medium Start Date:  08/02/21                           Expected End Date: 08/02/21                       Follow Up Date: 05/05/21   - schedule appointment with eye doctor    Why is this important?   Eye check-ups are important when you have diabetes.  Vision loss  can be prevented.    Notes:      Perform Foot Care-Diabetes Type 2       Timeframe:  Long-Range Goal Priority:  Medium Start Date:  08/02/20                           Expected End Date: 08/02/21                     Follow Up Date: 05/05/21   - check feet daily for cuts, sores or redness - do heel pump exercise 2 to 3 times each day - keep feet up while sitting - trim toenails straight across - wash and dry feet carefully every day - wear comfortable, cotton socks - wear comfortable, well-fitting shoes    Why is this important?   Good foot care is very important when you have diabetes.  There are many things you can do to keep your feet healthy and catch a problem early.    Notes:      COMPLETED: Pharmacy Care Plan       CARE PLAN ENTRY  Current Barriers:  Chronic Disease Management support, education, and care coordination needs related to Hypertension, Hyperlipidemia, and Diabetes   Hypertension Pharmacist Clinical Goal(s): Over the next 45 days, patient will work with PharmD and providers to achieve BP goal <130/80 Current regimen:  Amlodipine '10mg'$  daily Spironolactone '50mg'$  daily Interventions: Discussed proper blood pressure monitoring technique (rest for at least 5 minutes, arm at heart level resting on a hard surface, after taking blood pressure medications, etc) Reviewed recent home blood pressure readings Will collaborate with PCP about adding valsartan '80mg'$  daily Patient self care activities - Over the next  45 days, patient will: Check BP once daily and if symptomatic, document, and provide at future appointments Ensure daily salt intake < 2300 mg/day  Hyperlipidemia Pharmacist Clinical Goal(s): Over the next 45 days, patient will work with PharmD and providers to achieve LDL goal < 70 Current regimen:  Pravastatin '80mg'$  1/2 tablet daily Interventions: Provided dietary recommendations Patient self care activities - Over the next 45 days, patient will: Focus on a heart-healthy, balanced diet as discussed  Diabetes Pharmacist Clinical Goal(s): Over the next 30 days, patient will work with PharmD and providers to achieve A1c goal <7% Current regimen:  Ozempic '1mg'$  weekly Humalog 25 units before breakfast, 35 units before lunch, and 23 units before dinner Farxiga '10mg'$  once daily Tresiba 18 units nightly Interventions: Dietary recommendations provided Advised her to not get discouraged and continue smart dietary choices, just because she doesn't necessarily feel a difference does not mean she is not making a difference in her health. Reviewed recent blood glucose readings Discussed appropriate goals for fasting blood sugar (80-130) and 2 hours after eating (less than 180) Patient self care activities - Over the next 30 days, patient will: Check blood sugar continuously, but at least 3-4 times daily, document, and provide at future appointments Contact provider with any episodes of hypoglycemia Continue diet and exercise as current Continue to take medications as directed Patient to start drinking at least 10 cups of water  Medication management Pharmacist Clinical Goal(s): Over the next 90 days, patient will work with PharmD and providers to maintain optimal medication adherence Current pharmacy: [facility] Interventions Comprehensive medication review performed. Utilize [facility] pharmacy for medication synchronization, packaging and delivery Patient self care activities - Over  the next 90 days, patient will: Focus on medication  adherence by using adherence packaging Take medications as prescribed Report any questions or concerns to PharmD and/or provider(s)  Please see past updates related to this goal by clicking on the "Past Updates" button in the selected goal       COMPLETED: Stay Active and Independent-Low Back Pain       Timeframe:  Long-Range Goal Priority:  High Start Date: 08/02/20                            Expected End Date: 01/31/21                      Follow Up Date: 03/02/21  - plan exercise or activity when pain is best controlled - prioritize tasks for the day - stay active - check with my doctor before adding more exercise than I usually do - choose a type of activity I enjoy  - work slower and less intense when having pain    Why is this important?   Regular activity or exercise is important to managing back pain.  Activity helps to keep your muscles strong.  You will sleep better and feel more relaxed.  You will have more energy and feel less stressed.  If you are not active now, start slowly. Francys Bolin changes make a big difference.  Rest, but not too much.  Stay as active as you can and listen to your body's signals.     Notes:      COMPLETED: Syncope - no complications or hospitalizations related to Syncope       Timeframe:  Short-Term Goal Priority:  High Start Date:  12/22/20                           Expected End Date: 03/23/21   Next Follow up date: 03/02/21     Self Care Activities:  Continue to keep all scheduled follow up appointments Take medications as directed  Let your healthcare team know if you are unable to take your medications Call your pharmacy for refills at least 7 days prior to running out of medication Remember to increase water intake to 64 oz of water daily unless otherwise directed, balance activity with rest and adhere to ADA diet Continue monitor cbg's and BP at home, report abnormal readings or concerns  promptly to PCP and or RN CM Patient Goals: - no hospitalization due to reoccuring syncope episodes

## 2021-04-05 ENCOUNTER — Ambulatory Visit: Payer: Medicare Other

## 2021-04-07 ENCOUNTER — Ambulatory Visit
Admission: RE | Admit: 2021-04-07 | Discharge: 2021-04-07 | Disposition: A | Discharge status: Home / Self Care | Payer: Medicare Other | Source: Ambulatory Visit | Attending: Nurse Practitioner | Admitting: Nurse Practitioner

## 2021-04-07 ENCOUNTER — Other Ambulatory Visit: Payer: Self-pay

## 2021-04-07 DIAGNOSIS — Z1231 Encounter for screening mammogram for malignant neoplasm of breast: Secondary | ICD-10-CM | POA: Diagnosis not present

## 2021-04-07 DIAGNOSIS — N644 Mastodynia: Secondary | ICD-10-CM

## 2021-04-10 ENCOUNTER — Other Ambulatory Visit: Payer: Self-pay | Admitting: Nurse Practitioner

## 2021-04-10 DIAGNOSIS — E1142 Type 2 diabetes mellitus with diabetic polyneuropathy: Secondary | ICD-10-CM

## 2021-04-10 MED ORDER — PREGABALIN 50 MG PO CAPS: 50.0000 mg | ORAL_CAPSULE | Freq: Three times a day (TID) | ORAL | 2 refills | Status: DC

## 2021-04-12 ENCOUNTER — Telehealth: Payer: Self-pay

## 2021-04-12 NOTE — Telephone Encounter (Signed)
I called the pt for her to confirm how she is taking her farxiga. It's needed for a prior auth.

## 2021-04-13 ENCOUNTER — Other Ambulatory Visit: Payer: Self-pay

## 2021-04-13 ENCOUNTER — Ambulatory Visit (INDEPENDENT_AMBULATORY_CARE_PROVIDER_SITE_OTHER): Payer: Medicare Other | Admitting: Nurse Practitioner

## 2021-04-13 ENCOUNTER — Other Ambulatory Visit: Payer: Self-pay | Admitting: Nurse Practitioner

## 2021-04-13 ENCOUNTER — Encounter: Payer: Self-pay | Admitting: Nurse Practitioner

## 2021-04-13 VITALS — BP 134/80 | HR 98 | Temp 98.5°F | Ht 61.8 in | Wt 149.4 lb

## 2021-04-13 DIAGNOSIS — B373 Candidiasis of vulva and vagina: Secondary | ICD-10-CM | POA: Diagnosis not present

## 2021-04-13 DIAGNOSIS — R300 Dysuria: Secondary | ICD-10-CM | POA: Diagnosis not present

## 2021-04-13 DIAGNOSIS — E1165 Type 2 diabetes mellitus with hyperglycemia: Secondary | ICD-10-CM

## 2021-04-13 DIAGNOSIS — R3129 Other microscopic hematuria: Secondary | ICD-10-CM

## 2021-04-13 DIAGNOSIS — B3731 Acute candidiasis of vulva and vagina: Secondary | ICD-10-CM

## 2021-04-13 LAB — POCT URINALYSIS DIPSTICK
Glucose, UA: POSITIVE — AB
Leukocytes, UA: NEGATIVE
Nitrite, UA: NEGATIVE
Protein, UA: POSITIVE — AB
Spec Grav, UA: 1.025 (ref 1.010–1.025)
Urobilinogen, UA: 0.2 E.U./dL
pH, UA: 5 (ref 5.0–8.0)

## 2021-04-13 MED ORDER — FLUCONAZOLE 150 MG PO TABS: 150.0000 mg | ORAL_TABLET | Freq: Every day | ORAL | 0 refills | Status: DC

## 2021-04-13 NOTE — Progress Notes (Signed)
I,Yamilka Roman [employer] as a Education administrator for [employer], NP.,have documented all relevant documentation on the behalf of [employer], NP,as directed by  [name], NP while in the presence of [name], NP.  This visit occurred during the SARS-CoV-2 public health emergency.  Safety protocols were in place, including screening questions prior to the visit, additional usage of staff PPE, and extensive cleaning of exam room while observing appropriate contact time as indicated for disinfecting solutions.  Subjective:     Patient ID: [name] , female    DOB: [birth year] , 70 y.o.   MRN: [id number]   Chief Complaint  Patient presents with   Dysuria    HPI  Patient is here with increased pressure of the pelvis. She states that she was recently treated for a UTI. She also states that she has a lot of irritation in her vaginal area. She currently takes farsiga 5 mg. She is diabetic and has been taking all of her medications as prescribed.   Dysuria  This is a recurrent problem. The current episode started more than 1 month ago. The problem has been gradually worsening. The quality of the pain is described as burning. The pain is at a severity of 10/10. The pain is severe. There has been no fever. Associated symptoms include hematuria. Pertinent negatives include no chills, discharge, frequency, nausea or urgency. She has tried antibiotics and home medications (was previously given an antibotic and also has tried Engineer, technical sales) for the symptoms. The treatment provided no relief.    Past Medical History:  Diagnosis Date   Aortic stenosis    mild on 04/07/19 echo   Arthritis    L knee, hands, back    Asthma    Diabetes mellitus type 2, insulin dependent (HCC)    Fibromyalgia    Full dentures    Full dentures    GERD (gastroesophageal reflux disease)    H/O hiatal hernia    Headache    h/o migraines - was followed for a time with a wellness doctor   History  of esophageal dilatation    History of rhabdomyolysis    03/ 2015   Hyperlipidemia    Hypertension    Insulin pump in place    since 12/ 2014--  [facility]   Lumbar disc herniation    right leg weakness   Osteoarthritis of left knee, primary localized 08/17/2014   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   Renal insufficiency    Rhabdomyolysis 11/15/2013   SUI (stress urinary incontinence, female)    Syncope 12/27/2017   Wears glasses    Wears glasses      Family History  Problem Relation Age of Onset   Diabetes Mother    Hypertension Mother    Lung cancer Mother        smoked   Diabetes Father    Hypertension Father    Lung cancer Father        smoked   Diabetes Brother    Heart failure Sister    Diabetes Maternal Grandmother    Cancer Maternal Grandmother      Current Outpatient Medications:    amLODipine (NORVASC) 5 MG tablet, Take 1 tablet (5 mg total) by mouth at bedtime., Disp: 30 tablet, Rfl: 3   aspirin EC 81 MG EC tablet, Take 1 tablet (81 mg total) by mouth daily. Swallow whole., Disp: 30 tablet, Rfl: 11   blood glucose meter kit and supplies KIT, Dispense based on patient and insurance preference. Check blood  sugars 4 times daily E11.69, Disp: 1 each, Rfl: 1   Continuous Blood Gluc Receiver (FREESTYLE LIBRE READER) DEVI, 1 each by Does not apply route See admin instructions. CGM device-Freestyle Libre, Disp: , Rfl:    Continuous Blood Gluc Sensor (FREESTYLE LIBRE 14 DAY SENSOR) MISC, 1 each by Does not apply route See admin instructions. CGM 14-day sensors; send refills to [facility] (in [place]), Disp: , Rfl:    dapagliflozin propanediol (FARXIGA) 5 MG TABS tablet, Take 1 tablet (5 mg total) by mouth 2 (two) times daily., Disp: 180 tablet, Rfl: 2   fluconazole (DIFLUCAN) 150 MG tablet, Take 1 tablet (150 mg total) by mouth daily., Disp: 5 tablet, Rfl: 0   HUMALOG 100 UNIT/ML injection, Inject 10 Units into the skin daily. Adjust with sliding scale, Disp: , Rfl:    insulin  degludec (TRESIBA FLEXTOUCH) 100 UNIT/ML FlexTouch Pen, Inject 15 Units into the skin daily., Disp: 9 mL, Rfl: 1   Insulin Pen Needle (BD PEN NEEDLE MICRO U/F) 32G X 6 MM MISC, DX:E11.65 USE TO CHECK BLOOD SUGARS THREE TIMES A DAY, Disp: 100 each, Rfl: 6   Lancets Misc. MISC, 1 each by Does not apply route 4 (four) times daily -  with meals and at bedtime., Disp: 300 each, Rfl: 3   linaclotide (LINZESS) 145 MCG CAPS capsule, Take 1 capsule (145 mcg total) by mouth daily before breakfast., Disp: 90 capsule, Rfl: 1   mirabegron ER (MYRBETRIQ) 50 MG TB24 tablet, Take 1 tablet (50 mg total) by mouth daily., Disp: 90 tablet, Rfl: 0   Multiple Vitamin (MULTIVITAMIN WITH MINERALS) TABS tablet, Take 1 tablet by mouth daily., Disp: , Rfl:    ONETOUCH VERIO test strip, USE TO test blood sugar FOUR TIMES DAILY, Disp: 100 strip, Rfl: 1   pantoprazole (PROTONIX) 40 MG tablet, Take 1 tablet (40 mg total) by mouth daily., Disp: 90 tablet, Rfl: 1   pravastatin (PRAVACHOL) 40 MG tablet, TAKE ONE TABLET BY MOUTH EVERYDAY AT BEDTIME (Patient taking differently: Take 40 mg by mouth daily.), Disp: 90 tablet, Rfl: 2   pregabalin (LYRICA) 50 MG capsule, Take 1 capsule (50 mg total) by mouth 3 (three) times daily., Disp: 90 capsule, Rfl: 2   spironolactone (ALDACTONE) 50 MG tablet, Take 50 mg by mouth every morning., Disp: , Rfl:    Vitamin D, Ergocalciferol, (DRISDOL) 1.25 MG (50000 UNIT) CAPS capsule, TAKE ONE CAPSULE BY MOUTH ONCE WEEKLY ON WEDNESDAY, Disp: 13 capsule, Rfl: 1   Allergies  Allergen Reactions   Pollen Extract Other (See Comments)    Congestion/sneezing   Tomato Hives and Itching   Codeine Hives, Itching and Nausea And Vomiting     Review of Systems  Constitutional:  Negative for chills and fever.  HENT:  Negative for congestion.   Respiratory:  Negative for cough, shortness of breath and wheezing.   Cardiovascular:  Negative for chest pain.  Gastrointestinal:  Negative for diarrhea and nausea.   Genitourinary:  Positive for decreased urine volume, dysuria, hematuria, pelvic pain and vaginal pain. Negative for frequency, urgency, vaginal bleeding and vaginal discharge.  Neurological:  Negative for headaches.    Today's Vitals   04/13/21 1023  BP: 134/80  Pulse: 98  Temp: 98.5 F (36.9 C)  Weight: 149 lb 6.4 oz (67.8 kg)  Height: 5' 1.8" (1.57 m)  PainSc: 10-Worst pain ever   Body mass index is 27.5 kg/m.   Objective:  Physical Exam Constitutional:      Appearance: Normal appearance.  She is obese.  HENT:     Head: Normocephalic and atraumatic.  Cardiovascular:     Rate and Rhythm: Normal rate and regular rhythm.     Pulses: Normal pulses.     Heart sounds: Normal heart sounds. No murmur heard. Pulmonary:     Effort: Pulmonary effort is normal. No respiratory distress.     Breath sounds: Normal breath sounds. No wheezing.  Genitourinary:    Pubic Area: Rash present.     Labia:        Right: Rash and tenderness present.        Left: Rash and tenderness present.      Vagina: Vaginal discharge present.     Comments: Painful rash on her right and left labia.  Skin:    Capillary Refill: Capillary refill takes less than 2 seconds.  Neurological:     Mental Status: She is alert and oriented to person, place, and time.        Assessment And Plan:     1. Dysuria - POCT Urinalysis Dipstick (81002) - CBC with Diff - CMP14+EGFR - Urine Culture - US Pelvis Complete; Future  2. Vaginal candidiasis - fluconazole (DIFLUCAN) 150 MG tablet; Take 1 tablet (150 mg total) by mouth daily.  Dispense: 5 tablet; Refill: 0 -Advised patient to discontinue [place] Patient verbalized understanding  -Advised patient to work on better control of her diabetes.   3. Uncontrolled type 2 diabetes mellitus with hyperglycemia (HCC) - Hemoglobin A1c -Chronic, continue meds. Discontinue farsiga due to yeast infection.  --Discussed with patient the importance of glycemic control and long  term complications from uncontrolled diabetes. Discussed with the patient the importance of compliance with home glucose monitoring, diet which includes decrease amount of sugary drinks and foods. Importance of exercise was also discussed with the patient. Importance of eye exams, self foot care and compliance to office visits was also discussed with the patient.   4. Microscopic hematuria - Urine Culture - US Pelvis Complete; Future   Advised patient if her pain gets worse and she experiences any SOB or chest pain to go to the emergency room.  Follow up: if symptoms persist or do not get better.   Side effects and appropriate use of all the medication(s) were discussed with the patient today. Patient advised to use the medication(s) as directed by their healthcare provider. The patient was encouraged to read, review, and understand all associated package inserts and contact our office with any questions or concerns. The patient accepts the risks of the treatment plan and had an opportunity to ask questions.   The patient was encouraged to call or send a message through [facility] for any questions or concerns.   Patient was given opportunity to ask questions. Patient verbalized understanding of the plan and was able to repeat key elements of the plan. All questions were answered to their satisfaction.  Raman Amulya Quintin, DNP   I, Raman Nuel Dejaynes have reviewed all documentation for this visit. The documentation on 04/13/21 for the exam, diagnosis, procedures, and orders are all accurate and complete.    IF YOU HAVE BEEN REFERRED TO A SPECIALIST, IT MAY TAKE 1-2 WEEKS TO SCHEDULE/PROCESS THE REFERRAL. IF YOU HAVE NOT HEARD FROM US/SPECIALIST IN TWO WEEKS, PLEASE GIVE [place] A CALL AT [phone].   THE PATIENT IS ENCOURAGED TO PRACTICE SOCIAL DISTANCING DUE TO THE COVID-19 PANDEMIC.

## 2021-04-14 LAB — CMP14+EGFR
ALT: 6 IU/L (ref 0–32)
AST: 11 IU/L (ref 0–40)
Albumin/Globulin Ratio: 1.4 (ref 1.2–2.2)
Albumin: 3.6 g/dL — ABNORMAL LOW (ref 3.8–4.8)
Alkaline Phosphatase: 108 IU/L (ref 44–121)
BUN/Creatinine Ratio: 8 — ABNORMAL LOW (ref 12–28)
BUN: 10 mg/dL (ref 8–27)
Bilirubin Total: 0.6 mg/dL (ref 0.0–1.2)
CO2: 21 mmol/L (ref 20–29)
Calcium: 8.7 mg/dL (ref 8.7–10.3)
Chloride: 92 mmol/L — ABNORMAL LOW (ref 96–106)
Creatinine, Ser: 1.19 mg/dL — ABNORMAL HIGH (ref 0.57–1.00)
Globulin, Total: 2.5 g/dL (ref 1.5–4.5)
Glucose: 499 mg/dL — ABNORMAL HIGH (ref 65–99)
Potassium: 3.9 mmol/L (ref 3.5–5.2)
Sodium: 131 mmol/L — ABNORMAL LOW (ref 134–144)
Total Protein: 6.1 g/dL (ref 6.0–8.5)
eGFR: 50 mL/min/{1.73_m2} — ABNORMAL LOW (ref >59)

## 2021-04-14 LAB — CBC WITH DIFFERENTIAL/PLATELET
Basophils Absolute: 0 10*3/uL (ref 0.0–0.2)
Basos: 1 %
EOS (ABSOLUTE): 0 10*3/uL (ref 0.0–0.4)
Eos: 1 %
Hematocrit: 41.3 % (ref 34.0–46.6)
Hemoglobin: 13.3 g/dL (ref 11.1–15.9)
Immature Grans (Abs): 0 10*3/uL (ref 0.0–0.1)
Immature Granulocytes: 0 %
Lymphocytes Absolute: 1.4 10*3/uL (ref 0.7–3.1)
Lymphs: 24 %
MCH: 30.5 pg (ref 26.6–33.0)
MCHC: 32.2 g/dL (ref 31.5–35.7)
MCV: 95 fL (ref 79–97)
Monocytes Absolute: 0.4 10*3/uL (ref 0.1–0.9)
Monocytes: 7 %
Neutrophils Absolute: 4 10*3/uL (ref 1.4–7.0)
Neutrophils: 67 %
Platelets: 274 10*3/uL (ref 150–450)
RBC: 4.36 x10E6/uL (ref 3.77–5.28)
RDW: 12 % (ref 11.7–15.4)
WBC: 5.9 10*3/uL (ref 3.4–10.8)

## 2021-04-14 LAB — HEMOGLOBIN A1C
Est. average glucose Bld gHb Est-mCnc: 292 mg/dL
Hgb A1c MFr Bld: 11.8 % — ABNORMAL HIGH (ref 4.8–5.6)

## 2021-04-17 LAB — URINE CULTURE

## 2021-04-18 ENCOUNTER — Other Ambulatory Visit: Payer: Self-pay | Admitting: Nurse Practitioner

## 2021-04-18 DIAGNOSIS — N3090 Cystitis, unspecified without hematuria: Secondary | ICD-10-CM

## 2021-04-18 MED ORDER — NITROFURANTOIN MONOHYD MACRO 100 MG PO CAPS: 100.0000 mg | ORAL_CAPSULE | Freq: Two times a day (BID) | ORAL | 0 refills | Status: AC

## 2021-04-21 ENCOUNTER — Ambulatory Visit
Admission: RE | Admit: 2021-04-21 | Discharge: 2021-04-21 | Disposition: A | Discharge status: Home / Self Care | Payer: Medicare Other | Source: Ambulatory Visit | Attending: Nurse Practitioner | Admitting: Nurse Practitioner

## 2021-04-21 DIAGNOSIS — R3129 Other microscopic hematuria: Secondary | ICD-10-CM

## 2021-04-21 DIAGNOSIS — R300 Dysuria: Secondary | ICD-10-CM

## 2021-04-27 ENCOUNTER — Other Ambulatory Visit: Payer: Self-pay

## 2021-04-27 ENCOUNTER — Telehealth: Payer: Self-pay

## 2021-04-27 MED ORDER — AMLODIPINE BESYLATE 5 MG PO TABS: 5.0000 mg | ORAL_TABLET | Freq: Every day | ORAL | 1 refills | Status: DC

## 2021-04-27 NOTE — Chronic Care Management (AMB) (Signed)
Chronic Care Management Pharmacy Assistant   Name: [name]  MRN: [id number] DOB: 04-May-1951   Reason for Encounter: Medication Review/ Medication coordination  Recent office visits:  04-04-2021 [name], RN (CCM)  04-13-2021 [name], NP.  Glucose= 499, Creatinine= 1.19, BUN/ Creatinine= 8, eGFR= 50, Sodium= 131, Chloride= 92, Albumin= 3.6. A1C=11.8. POCT UA glucose= Positive, Protein UA= Positive. START Diflucan 150 mg daily for 5 days.  Recent consult visits:  None  Hospital visits:  Medication Reconciliation was completed by comparing discharge summary, patient's EMR and Pharmacy list, and upon discussion with patient.   Admitted to the hospital on 11-29-2020 due to Syncope. Discharge date was 12-01-2020. Discharged from [facility]?Medications Started at [facility] Discharge:?? Pantoprazole 40 mg daily   Medication Changes at Hospital Discharge: None   Medications Discontinued at Hospital Discharge: Spironolactone 50 mg Humalog 100 units   Medications that remain the same after Hospital Discharge:?? -All other medications will remain the same.      Medications: Outpatient Encounter Medications as of 04/27/2021  Medication Sig   amLODipine (NORVASC) 5 MG tablet Take 1 tablet (5 mg total) by mouth at bedtime.   aspirin EC 81 MG EC tablet Take 1 tablet (81 mg total) by mouth daily. Swallow whole.   blood glucose meter kit and supplies KIT Dispense based on patient and insurance preference. Check blood sugars 4 times daily E11.69   Continuous Blood Gluc Receiver (FREESTYLE LIBRE READER) DEVI 1 each by Does not apply route See admin instructions. CGM device-Freestyle Libre   Continuous Blood Gluc Sensor (FREESTYLE LIBRE 14 DAY SENSOR) MISC 1 each by Does not apply route See admin instructions. CGM 14-day sensors; send refills to [facility] (in [facility])   fluconazole (DIFLUCAN) 150 MG tablet Take 1 tablet (150 mg total) by  mouth daily.   HUMALOG 100 UNIT/ML injection Inject 10 Units into the skin daily. Adjust with sliding scale   insulin degludec (TRESIBA FLEXTOUCH) 100 UNIT/ML FlexTouch Pen Inject 15 Units into the skin daily.   Insulin Pen Needle (BD PEN NEEDLE MICRO U/F) 32G X 6 MM MISC DX:E11.65 USE TO CHECK BLOOD SUGARS THREE TIMES A DAY   Lancets Misc. MISC 1 each by Does not apply route 4 (four) times daily -  with meals and at bedtime.   linaclotide (LINZESS) 145 MCG CAPS capsule Take 1 capsule (145 mcg total) by mouth daily before breakfast.   mirabegron ER (MYRBETRIQ) 50 MG TB24 tablet Take 1 tablet (50 mg total) by mouth daily.   Multiple Vitamin (MULTIVITAMIN WITH MINERALS) TABS tablet Take 1 tablet by mouth daily.   ONETOUCH VERIO test strip USE TO test blood sugar FOUR TIMES DAILY   pantoprazole (PROTONIX) 40 MG tablet Take 1 tablet (40 mg total) by mouth daily.   pravastatin (PRAVACHOL) 40 MG tablet TAKE ONE TABLET BY MOUTH EVERYDAY AT BEDTIME (Patient taking differently: Take 40 mg by mouth daily.)   pregabalin (LYRICA) 50 MG capsule Take 1 capsule (50 mg total) by mouth 3 (three) times daily.   spironolactone (ALDACTONE) 50 MG tablet Take 50 mg by mouth every morning.   Vitamin D, Ergocalciferol, (DRISDOL) 1.25 MG (50000 UNIT) CAPS capsule TAKE ONE CAPSULE BY MOUTH ONCE WEEKLY ON WEDNESDAY   No facility-administered encounter medications on file as of 04/27/2021.   Reviewed chart for medication changes ahead of medication coordination call.  No OVs, Consults, or hospital visits since last care coordination call/Pharmacist visit. (If appropriate,  list visit date, provider name)  No medication changes indicated OR if recent visit, treatment plan here.  BP Readings from Last 3 Encounters:  04/13/21 134/80  03/10/21 (!) 146/77  02/06/21 (!) 156/81    Lab Results  Component Value Date   HGBA1C 11.8 (H) 04/13/2021     Patient obtains medications through Adherence Packaging  30 Days   Last  adherence delivery included:  Women's 50 Plus Multivitamin- 1 tablet daily (breakfast) Ergocalciferol 50,000 units- 1 capsule weekly on Wednesdays (breakfast) Farxiga 5 gm- 2 tablets daily (before breakfast) Pravastatin 40 mg- 1 tablet daily (bedtime) Linzess 145 mcg- 1 tablet daily(before breakfast) Amlodipine 5 mg- 1 tablet daily (bedtime) Pantoprazole 40 mg- 1 tablet daily (breakfast) Humalog Kwik Injection pen 25 units  Patient declined (meds) last month: None  Patient is due for next adherence delivery on: 05-04-2021  Called patient and reviewed medications and coordinated delivery.  This delivery to include: Women's 50 Plus Multivitamin- 1 tablet daily (breakfast) Ergocalciferol 50,000 units- 1 capsule weekly on Wednesdays (breakfast) Pravastatin 40 mg- 1 tablet daily (bedtime) Linzess 145 mcg- 1 tablet daily(before breakfast) Amlodipine 5 mg- 1 tablet daily (bedtime) Pantoprazole 40 mg- 1 tablet daily (breakfast) Humalog Kwik Injection pen 25 units  No short/ acute fill needed  Patient declined the following medications: Nitrofurantoin 100 mg BID- Completed antibiotic Farxiga 5 mg BID- Discontinued   Patient needs refills for: Refill request sent for Amlodipine 5 mg daily   Confirmed delivery date of 05-04-2021 advised patient that pharmacy will contact them the morning of delivery.   Care Gaps: Covid booster overdue Ophthalmology exam overdue Foot exam overdue Shingrix overdue RAF= 2.612% Medicare wellness 01-14-2022  Star Rating Drugs: Pravastatin 40 mg- Last filled 03-31-2021 [facility] Farxiga 10 mg- Last filled 04-05-2021 [facility]  [place] Clinical Pharmacist Assistant [phone]

## 2021-04-28 ENCOUNTER — Telehealth: Payer: Self-pay

## 2021-04-28 NOTE — Chronic Care Management (AMB) (Signed)
04/28/2021- Prior Authorization request fax received for Farxiga 5 mg. Patient is no longer taking medication. No prior authorization needed.  [name], [place] Pharmacist Assistant [phone]

## 2021-05-01 ENCOUNTER — Other Ambulatory Visit: Payer: Self-pay | Admitting: Nurse Practitioner

## 2021-05-01 DIAGNOSIS — N3090 Cystitis, unspecified without hematuria: Secondary | ICD-10-CM

## 2021-05-03 ENCOUNTER — Encounter: Payer: Self-pay | Admitting: Nurse Practitioner

## 2021-05-03 ENCOUNTER — Telehealth: Payer: Self-pay

## 2021-05-03 ENCOUNTER — Other Ambulatory Visit: Payer: Self-pay

## 2021-05-03 ENCOUNTER — Ambulatory Visit (INDEPENDENT_AMBULATORY_CARE_PROVIDER_SITE_OTHER): Payer: Medicare Other | Admitting: Nurse Practitioner

## 2021-05-03 ENCOUNTER — Ambulatory Visit: Payer: Self-pay | Admitting: Nurse Practitioner

## 2021-05-03 VITALS — BP 140/72 | HR 77 | Temp 98.0°F | Ht 62.0 in | Wt 159.2 lb

## 2021-05-03 DIAGNOSIS — Z23 Encounter for immunization: Secondary | ICD-10-CM

## 2021-05-03 DIAGNOSIS — Z6829 Body mass index (BMI) 29.0-29.9, adult: Secondary | ICD-10-CM

## 2021-05-03 DIAGNOSIS — N183 Chronic kidney disease, stage 3 unspecified: Secondary | ICD-10-CM

## 2021-05-03 DIAGNOSIS — I10 Essential (primary) hypertension: Secondary | ICD-10-CM

## 2021-05-03 DIAGNOSIS — I129 Hypertensive chronic kidney disease with stage 1 through stage 4 chronic kidney disease, or unspecified chronic kidney disease: Secondary | ICD-10-CM

## 2021-05-03 DIAGNOSIS — N1831 Chronic kidney disease, stage 3a: Secondary | ICD-10-CM

## 2021-05-03 DIAGNOSIS — E1165 Type 2 diabetes mellitus with hyperglycemia: Secondary | ICD-10-CM | POA: Diagnosis not present

## 2021-05-03 MED ORDER — ZOSTER VAC RECOMB ADJUVANTED 50 MCG/0.5ML IM SUSR: 0.5000 mL | Freq: Once | INTRAMUSCULAR | 0 refills | Status: AC

## 2021-05-03 MED ORDER — HUMALOG 100 UNIT/ML IJ SOLN: 10.0000 [IU] | Freq: Three times a day (TID) | INTRAMUSCULAR | 3 refills | Status: DC

## 2021-05-03 MED ORDER — TRESIBA FLEXTOUCH 100 UNIT/ML ~~LOC~~ SOPN: 20.0000 [IU] | PEN_INJECTOR | Freq: Every day | SUBCUTANEOUS | 1 refills | Status: DC

## 2021-05-03 MED ORDER — KERENDIA 10 MG PO TABS
1.0000 | ORAL_TABLET | Freq: Every day | ORAL | 2 refills | Status: DC
Start: 2021-05-03 — End: 2021-08-23

## 2021-05-03 NOTE — Patient Instructions (Addendum)
Diabetes Mellitus and Nutrition, Adult When you have diabetes, or diabetes mellitus, it is very important to have healthy eating habits because your blood sugar (glucose) levels are greatly affected by what you eat and drink. Eating healthy foods in the right amounts, at about the same times every day, can help you: Control your blood glucose. Lower your risk of heart disease. Improve your blood pressure. Reach or maintain a healthy weight. What can affect my meal plan? Every person with diabetes is different, and each person has different needs for a meal plan. Your health care provider may recommend that you work with a dietitian to make a meal plan that is best for you. Your meal plan may vary depending on factors such as: The calories you need. The medicines you take. Your weight. Your blood glucose, blood pressure, and cholesterol levels. Your activity level. Other health conditions you have, such as heart or kidney disease. How do carbohydrates affect me? Carbohydrates, also called carbs, affect your blood glucose level more than any other type of food. Eating carbs naturally raises the amount of glucose in your blood. Carb counting is a method for keeping track of how many carbs you eat. Counting carbs is important to keep your blood glucose at a healthy level, especially if you use insulin or take certain oral diabetes medicines. It is important to know how many carbs you can safely have in each meal. This is different for every person. Your dietitian can help you calculate how many carbs you should have at each meal and for each snack. How does alcohol affect me? Alcohol can cause a sudden decrease in blood glucose (hypoglycemia), especially if you use insulin or take certain oral diabetes medicines. Hypoglycemia can be a life-threatening condition. Symptoms of hypoglycemia, such as sleepiness, dizziness, and confusion, are similar to symptoms of having too much alcohol. Do not drink  alcohol if: Your health care provider tells you not to drink. You are pregnant, may be pregnant, or are planning to become pregnant. If you drink alcohol: Do not drink on an empty stomach. Limit how much you use to: 0-1 drink a day for women. 0-2 drinks a day for men. Be aware of how much alcohol is in your drink. In the [place] one drink equals one 12 oz bottle of beer (355 mL), one 5 oz glass of wine (148 mL), or one 1 oz glass of hard liquor (44 mL). Keep yourself hydrated with water, diet soda, or unsweetened iced tea. Keep in mind that regular soda, juice, and other mixers may contain a lot of sugar and must be counted as carbs. What are tips for following this plan? Reading food labels Start by checking the serving size on the "Nutrition Facts" label of packaged foods and drinks. The amount of calories, carbs, fats, and other nutrients listed on the label is based on one serving of the item. Many items contain more than one serving per package. Check the total grams (g) of carbs in one serving. You can calculate the number of servings of carbs in one serving by dividing the total carbs by 15. For example, if a food has 30 g of total carbs per serving, it would be equal to 2 servings of carbs. Check the number of grams (g) of saturated fats and trans fats in one serving. Choose foods that have a low amount or none of these fats. Check the number of milligrams (mg) of salt (sodium) in one serving. Most people should limit  total sodium intake to less than 2,300 mg per day. Always check the nutrition information of foods labeled as "low-fat" or "nonfat." These foods may be higher in added sugar or refined carbs and should be avoided. Talk to your dietitian to identify your daily goals for nutrients listed on the label. Shopping Avoid buying canned, pre-made, or processed foods. These foods tend to be high in fat, sodium, and added sugar. Shop around the outside edge of the grocery store. This  is where you will most often find fresh fruits and vegetables, bulk grains, fresh meats, and fresh dairy. Cooking Use low-heat cooking methods, such as baking, instead of high-heat cooking methods like deep frying. Cook using healthy oils, such as olive, canola, or sunflower oil. Avoid cooking with butter, cream, or high-fat meats. Meal planning Eat meals and snacks regularly, preferably at the same times every day. Avoid going long periods of time without eating. Eat foods that are high in fiber, such as fresh fruits, vegetables, beans, and whole grains. Talk with your dietitian about how many servings of carbs you can eat at each meal. Eat 4-6 oz (112-168 g) of lean protein each day, such as lean meat, chicken, fish, eggs, or tofu. One ounce (oz) of lean protein is equal to: 1 oz (28 g) of meat, chicken, or fish. 1 egg.  cup (62 g) of tofu. Eat some foods each day that contain healthy fats, such as avocado, nuts, seeds, and fish. What foods should I eat? Fruits Berries. Apples. Oranges. Peaches. Apricots. Plums. Grapes. Mango. Papaya. Pomegranate. Kiwi. Cherries. Vegetables Lettuce. Spinach. Leafy greens, including kale, chard, collard greens, and mustard greens. Beets. Cauliflower. Cabbage. Broccoli. Carrots. Green beans. Tomatoes. Peppers. Onions. Cucumbers. Brussels sprouts. Grains Whole grains, such as whole-wheat or whole-grain bread, crackers, tortillas, cereal, and pasta. Unsweetened oatmeal. Quinoa. Brown or wild rice. Meats and other proteins Seafood. Poultry without skin. Lean cuts of poultry and beef. Tofu. Nuts. Seeds. Dairy Low-fat or fat-free dairy products such as milk, yogurt, and cheese. The items listed above may not be a complete list of foods and beverages you can eat. Contact a dietitian for more information. What foods should I avoid? Fruits Fruits canned with syrup. Vegetables Canned vegetables. Frozen vegetables with butter or cream sauce. Grains Refined  white flour and flour products such as bread, pasta, snack foods, and cereals. Avoid all processed foods. Meats and other proteins Fatty cuts of meat. Poultry with skin. Breaded or fried meats. Processed meat. Avoid saturated fats. Dairy Full-fat yogurt, cheese, or milk. Beverages Sweetened drinks, such as soda or iced tea. The items listed above may not be a complete list of foods and beverages you should avoid. Contact a dietitian for more information. Questions to ask a health care provider Do I need to meet with a diabetes educator? Do I need to meet with a dietitian? What number can I call if I have questions? When are the best times to check my blood glucose? Where to find more information: American Diabetes Association: diabetes.org Academy of Nutrition and Dietetics: www.eatright.[employer] of Diabetes and Digestive and Kidney Diseases: [employer] Association of Diabetes Care and Education Specialists: www.diabeteseducator.org Summary It is important to have healthy eating habits because your blood sugar (glucose) levels are greatly affected by what you eat and drink. A healthy meal plan will help you control your blood glucose and maintain a healthy lifestyle. Your health care provider may recommend that you work with a dietitian to make a meal plan that  is best for you. Keep in mind that carbohydrates (carbs) and alcohol have immediate effects on your blood glucose levels. It is important to count carbs and to use alcohol carefully. This information is not intended to replace advice given to you by your health care provider. Make sure you discuss any questions you have with your health care provider. Document Revised: 07/21/2019 Document Reviewed: 07/21/2019 Elsevier Patient Education  [place] when at home  Wear compression socks 2 hours on 2 hours off Increase water to at least 32 oz to start Drink ONLY 8 oz of sweet tea a day,  otherwise drink water.  Call or send via my chart your blood sugar readings on Mon or Wed

## 2021-05-03 NOTE — Chronic Care Management (AMB) (Signed)
    [name] was reminded to have all medications, supplements and any blood glucose and blood pressure readings available for review with [name], Pharm. D, at her telephone visit on 05-04-2021 at 2:15.   Questions: Have you had any recent office visit or specialist visit outside of [place]? Patient stated no  Are there any concerns you would like to discuss during your office visit? Patient stated left foot is swollen  Are you having any problems obtaining your medications? (Whether it pharmacy issues or cost) Patient stated no  If patient has any PAP medications ask if they are having any problems getting their PAP medication or refill? No PAP medications  Care Gaps: Covid booster overdue Ophthalmology exam overdue Foot exam overdue Shingrix overdue RAF= 2.612% Medicare wellness 01-14-2022  Star Rating Drug: Pravastatin 40 mg- Last filled 03-31-2021 30 [facility]  Any gaps in medications fill history? No  [place] Pharmacist Assistant [phone]

## 2021-05-03 NOTE — Progress Notes (Signed)
I,Yamilka Roman [employer] as a Education administrator for [employer], FNP.,have documented all relevant documentation on the behalf of [name], FNP,as directed by  [name], FNP while in the presence of [name], [place].   This visit occurred during the SARS-CoV-2 public health emergency.  Safety protocols were in place, including screening questions prior to the visit, additional usage of staff PPE, and extensive cleaning of exam room while observing appropriate contact time as indicated for disinfecting solutions.  Subjective:     Patient ID: [name] , female    DOB: 08-Dec-1950 , 70 y.o.   MRN: [id number]   Chief Complaint  Patient presents with   Diabetes   Hypertension    HPI  Patient presents today for a diabetes and bp f/u. She was taken off of Farxiga at her last visit. She stated her blood sugars been up and down averaging around 180s and 160s but she did have 3 readings where the meter just said "high" she reports she has been doing the same routine she is unsure why she had the "high" readings. This morning blood sugar has been 289. For the last 2 weeks has been in the 200's and 2 weeks before that was good while on Farxiga. She is taking 25 units of Humalog in the morning in addition to Humalog 10 units 3 times a day before meals and and 15 units of Tresiba in evening. She also complains her left leg and foot have been swollen for the past 2 weeks. She tries to elevate her legs when she is sitting. She has had some pain in that leg when walking. When she had the infection her blood sugar was staying elevated. Only her left foot feels like it is squeezing. She is taking lyrica.    Wt Readings from Last 3 Encounters: 05/03/21 : 159 lb 3.2 oz (72.2 kg) 04/13/21 : 149 lb 6.4 oz (67.8 kg) 02/06/21 : 146 lb (66.2 kg)      Diabetes She presents for her follow-up diabetic visit. She has type 2 diabetes mellitus. Pertinent negatives for diabetes include no polydipsia, no  polyphagia and no polyuria.    Past Medical History:  Diagnosis Date   Aortic stenosis    mild on 04/07/19 echo   Arthritis    L knee, hands, back    Asthma    Diabetes mellitus type 2, insulin dependent (HCC)    Fibromyalgia    Full dentures    Full dentures    GERD (gastroesophageal reflux disease)    H/O hiatal hernia    Headache    h/o migraines - was followed for a time with a wellness doctor   History of esophageal dilatation    History of rhabdomyolysis    03/ 2015   Hyperlipidemia    Hypertension    Insulin pump in place    since 12/ 2014--  [facility]   Lumbar disc herniation    right leg weakness   Osteoarthritis of left knee, primary localized 08/17/2014   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   Renal insufficiency    Rhabdomyolysis 11/15/2013   SUI (stress urinary incontinence, female)    Syncope 12/27/2017   Wears glasses    Wears glasses      Family History  Problem Relation Age of Onset   Diabetes Mother    Hypertension Mother    Lung cancer Mother        smoked   Diabetes Father    Hypertension Father    Lung  cancer Father        smoked   Diabetes Brother    Heart failure Sister    Diabetes Maternal Grandmother    Cancer Maternal Grandmother      Current Outpatient Medications:    amLODipine (NORVASC) 5 MG tablet, Take 1 tablet (5 mg total) by mouth at bedtime., Disp: 90 tablet, Rfl: 1   aspirin EC 81 MG EC tablet, Take 1 tablet (81 mg total) by mouth daily. Swallow whole., Disp: 30 tablet, Rfl: 11   blood glucose meter kit and supplies KIT, Dispense based on patient and insurance preference. Check blood sugars 4 times daily E11.69, Disp: 1 each, Rfl: 1   Continuous Blood Gluc Receiver (FREESTYLE LIBRE READER) DEVI, 1 each by Does not apply route See admin instructions. CGM device-Freestyle Libre, Disp: , Rfl:    Continuous Blood Gluc Sensor (FREESTYLE LIBRE 14 DAY SENSOR) MISC, 1 each by Does not apply route See admin instructions. CGM 14-day sensors; send  refills to [employer] [facility] (in Epic), Disp: , Rfl:    Finerenone (KERENDIA) 10 MG TABS, Take 1 tablet by mouth daily., Disp: 30 tablet, Rfl: 2   Insulin Pen Needle (BD PEN NEEDLE MICRO U/F) 32G X 6 MM MISC, DX:E11.65 USE TO CHECK BLOOD SUGARS THREE TIMES A DAY, Disp: 100 each, Rfl: 6   Lancets Misc. MISC, 1 each by Does not apply route 4 (four) times daily -  with meals and at bedtime., Disp: 300 each, Rfl: 3   linaclotide (LINZESS) 145 MCG CAPS capsule, Take 1 capsule (145 mcg total) by mouth daily before breakfast., Disp: 90 capsule, Rfl: 1   mirabegron ER (MYRBETRIQ) 50 MG TB24 tablet, Take 1 tablet (50 mg total) by mouth daily., Disp: 90 tablet, Rfl: 0   Multiple Vitamin (MULTIVITAMIN WITH MINERALS) TABS tablet, Take 1 tablet by mouth daily., Disp: , Rfl:    ONETOUCH VERIO test strip, USE TO test blood sugar FOUR TIMES DAILY, Disp: 100 strip, Rfl: 1   pantoprazole (PROTONIX) 40 MG tablet, Take 1 tablet (40 mg total) by mouth daily., Disp: 90 tablet, Rfl: 1   pravastatin (PRAVACHOL) 40 MG tablet, TAKE ONE TABLET BY MOUTH EVERYDAY AT BEDTIME (Patient taking differently: Take 40 mg by mouth daily.), Disp: 90 tablet, Rfl: 2   pregabalin (LYRICA) 50 MG capsule, Take 1 capsule (50 mg total) by mouth 3 (three) times daily., Disp: 90 capsule, Rfl: 2   Vitamin D, Ergocalciferol, (DRISDOL) 1.25 MG (50000 UNIT) CAPS capsule, TAKE ONE CAPSULE BY MOUTH ONCE WEEKLY ON WEDNESDAY, Disp: 13 capsule, Rfl: 1   fluconazole (DIFLUCAN) 150 MG tablet, Take 1 tablet (150 mg total) by mouth daily. (Patient not taking: Reported on 05/03/2021), Disp: 5 tablet, Rfl: 0   HUMALOG 100 UNIT/ML injection, Inject 0.1 mLs (10 Units total) into the skin 3 (three) times daily with meals., Disp: 10 mL, Rfl: 3   insulin degludec (TRESIBA FLEXTOUCH) 100 UNIT/ML FlexTouch Pen, Inject 20 Units into the skin daily., Disp: 9 mL, Rfl: 1   Allergies  Allergen Reactions   Pollen Extract Other (See Comments)    Congestion/sneezing   Tomato  Hives and Itching   Codeine Hives, Itching and Nausea And Vomiting     Review of Systems  Constitutional: Negative.   Respiratory: Negative.    Cardiovascular: Negative.   Endocrine: Negative for polydipsia, polyphagia and polyuria.  Neurological: Negative.   Psychiatric/Behavioral: Negative.      Today's Vitals   05/03/21 1030  BP: 140/72  Pulse: 77  Temp: 98 F (36.7 C)  Weight: 159 lb 3.2 oz (72.2 kg)  Height: 5' 2" (1.575 m)  PainSc: 6   PainLoc: Leg   Body mass index is 29.12 kg/m.   Objective:  Physical Exam Vitals reviewed.  Constitutional:      General: She is not in acute distress.    Appearance: Normal appearance.  Cardiovascular:     Rate and Rhythm: Normal rate and regular rhythm.     Pulses: Normal pulses.     Heart sounds: Normal heart sounds. No murmur heard. Pulmonary:     Effort: Pulmonary effort is normal. No respiratory distress.     Breath sounds: Normal breath sounds. No wheezing.  Neurological:     General: No focal deficit present.     Mental Status: She is alert and oriented to person, place, and time.     Cranial Nerves: No cranial nerve deficit.  Psychiatric:        Mood and Affect: Mood normal.        Behavior: Behavior normal.        Thought Content: Thought content normal.        Judgment: Judgment normal.        Assessment And Plan:     1. Uncontrolled type 2 diabetes mellitus with hyperglycemia (HCC) Her blood sugars are elevated and she has been taking her insulin incorrectly. I have had a long discussion with her about adhering to her medication regimen and I do think she will benefit going to an Endocrinologist and she is willing to go.  Her significant other is here today with her visit and shares information about her diet and eating high sugar foods.  I have also sent her information to [facility] diabetic educator to help with her disease process - insulin degludec (TRESIBA FLEXTOUCH) 100 UNIT/ML FlexTouch Pen; Inject 20  Units into the skin daily.  Dispense: 9 mL; Refill: 1 - Finerenone (KERENDIA) 10 MG TABS; Take 1 tablet by mouth daily.  Dispense: 30 tablet; Refill: 2 - Ambulatory referral to Endocrinology  2. Benign hypertension with CKD (chronic kidney disease) stage III (HCC) B/P is fairly controlled.   The importance of regular exercise and dietary modification was stressed to the patient.   3. Immunization due Influenza vaccine administered Encouraged to take Tylenol as needed for fever or muscle aches. - Flu Vaccine QUAD High Dose(Fluad)  4. BMI 29.0-29.9,adult   I personally spent 40 minutes face-to-face and non-face-to-face in the care of this patient, which includes all pre-, intra-, and post visit time on the date of service.  Patient was given opportunity to ask questions. Patient verbalized understanding of the plan and was able to repeat key elements of the plan. All questions were answered to their satisfaction.  [name], FNP   I, [name], FNP, have reviewed all documentation for this visit. The documentation on 05/17/21 for the exam, diagnosis, procedures, and orders are all accurate and complete.   IF YOU HAVE BEEN REFERRED TO A SPECIALIST, IT MAY TAKE 1-2 WEEKS TO SCHEDULE/PROCESS THE REFERRAL. IF YOU HAVE NOT HEARD FROM US/SPECIALIST IN TWO WEEKS, PLEASE GIVE [place] A CALL AT [phone].   THE PATIENT IS ENCOURAGED TO PRACTICE SOCIAL DISTANCING DUE TO THE COVID-19 PANDEMIC.

## 2021-05-04 ENCOUNTER — Telehealth: Payer: Self-pay

## 2021-05-04 DIAGNOSIS — N3020 Other chronic cystitis without hematuria: Secondary | ICD-10-CM | POA: Diagnosis not present

## 2021-05-04 NOTE — Telephone Encounter (Signed)
PA for Carrington Clamp has been submitted through covermymeds we are just waiting on the determination from her insurance company. YL,RMA

## 2021-05-05 ENCOUNTER — Telehealth: Payer: Medicare Other

## 2021-05-05 DIAGNOSIS — Z794 Long term (current) use of insulin: Secondary | ICD-10-CM | POA: Diagnosis not present

## 2021-05-05 DIAGNOSIS — E118 Type 2 diabetes mellitus with unspecified complications: Secondary | ICD-10-CM | POA: Diagnosis not present

## 2021-05-09 ENCOUNTER — Encounter: Payer: Self-pay | Admitting: Nurse Practitioner

## 2021-05-10 ENCOUNTER — Other Ambulatory Visit: Payer: Self-pay | Admitting: Nurse Practitioner

## 2021-05-10 DIAGNOSIS — E1165 Type 2 diabetes mellitus with hyperglycemia: Secondary | ICD-10-CM

## 2021-05-10 MED ORDER — HUMALOG 100 UNIT/ML IJ SOLN: 10.0000 [IU] | Freq: Three times a day (TID) | INTRAMUSCULAR | 3 refills | Status: DC

## 2021-05-11 ENCOUNTER — Telehealth: Payer: Self-pay

## 2021-05-11 NOTE — Telephone Encounter (Signed)
  Care Management   Follow Up Note   05/11/2021 Name: [name] MRN: [id number] DOB: Jun 24, 1951   Referred by: [name], FNP Reason for referral : Chronic Care Management (Inbound call from patient )  Voice message received from patient requesting a return call. The patient was referred to the case management team for assistance with care management and care coordination.   Follow Up Plan: Telephone follow up appointment with care management team member scheduled for: 05/12/21  [name], RN, BSN, CCM Care Management Coordinator [place] [facility]  Direct Phone: [phone]

## 2021-05-12 ENCOUNTER — Ambulatory Visit (INDEPENDENT_AMBULATORY_CARE_PROVIDER_SITE_OTHER): Payer: Medicare Other

## 2021-05-12 ENCOUNTER — Telehealth: Payer: Medicare Other

## 2021-05-12 DIAGNOSIS — I10 Essential (primary) hypertension: Secondary | ICD-10-CM

## 2021-05-12 DIAGNOSIS — E782 Mixed hyperlipidemia: Secondary | ICD-10-CM

## 2021-05-12 DIAGNOSIS — E1165 Type 2 diabetes mellitus with hyperglycemia: Secondary | ICD-10-CM

## 2021-05-12 DIAGNOSIS — Z9889 Other specified postprocedural states: Secondary | ICD-10-CM

## 2021-05-16 NOTE — Patient Instructions (Signed)
Visit Information  PATIENT GOALS:  Goals Addressed      Monitor and Manage My Blood Sugar-Diabetes Type 2   On track    Timeframe:  Long-Range Goal Priority:  High Start Date: 08/02/20                            Expected End Date: 08/02/21                     Follow Up Date: 06/22/21  - check blood sugar at prescribed times - check blood sugar before and after exercise - check blood sugar if I feel it is too high or too low - enter blood sugar readings and medication or insulin into daily log - take the blood sugar log to all doctor visits - take the blood sugar meter to all doctor visits    Why is this important?   Checking your blood sugar at home helps to keep it from getting very high or very low.  Writing the results in a diary or log helps the doctor know how to care for you.  Your blood sugar log should have the time, date and the results.  Also, write down the amount of insulin or other medicine that you take.  Other information, like what you ate, exercise done and how you were feeling, will also be helpful.     Notes:         The patient verbalized understanding of instructions, educational materials, and care plan provided today and declined offer to receive copy of patient instructions, educational materials, and care plan.   Telephone follow up appointment with care management team member scheduled for: 06/22/21  [name], RN, BSN, CCM Care Management Coordinator [place] [facility]  Direct Phone: [phone]

## 2021-05-16 NOTE — Chronic Care Management (AMB) (Signed)
Chronic Care Management   CCM RN Visit Note  05/12/2021 Name: [name] MRN: [id number] DOB: 02-11-1951  Subjective: [name] is a 70 y.o. year old female who is a primary care patient of [name], [place]. The care management team was consulted for assistance with disease management and care coordination needs.    Engaged with patient by telephone for follow up visit in response to provider referral for case management and/or care coordination services.   Consent to Services:  The patient was given information about Chronic Care Management services, agreed to services, and gave verbal consent prior to initiation of services.  Please see initial visit note for detailed documentation.   Patient agreed to services and verbal consent obtained.   Assessment: Review of patient past medical history, allergies, medications, health status, including review of consultants reports, laboratory and other test data, was performed as part of comprehensive evaluation and provision of chronic care management services.   SDOH (Social Determinants of Health) assessments and interventions performed:    CCM Care Plan  Allergies  Allergen Reactions   Pollen Extract Other (See Comments)    Congestion/sneezing   Tomato Hives and Itching   Codeine Hives, Itching and Nausea And Vomiting    Outpatient Encounter Medications as of 05/12/2021  Medication Sig   amLODipine (NORVASC) 5 MG tablet Take 1 tablet (5 mg total) by mouth at bedtime.   aspirin EC 81 MG EC tablet Take 1 tablet (81 mg total) by mouth daily. Swallow whole.   blood glucose meter kit and supplies KIT Dispense based on patient and insurance preference. Check blood sugars 4 times daily E11.69   Continuous Blood Gluc Receiver (FREESTYLE LIBRE READER) DEVI 1 each by Does not apply route See admin instructions. CGM device-Freestyle Libre   Continuous Blood Gluc Sensor (FREESTYLE LIBRE 14 DAY SENSOR) MISC 1 each by Does not  apply route See admin instructions. CGM 14-day sensors; send refills to [employer] (in Epic)   Finerenone (KERENDIA) 10 MG TABS Take 1 tablet by mouth daily.   fluconazole (DIFLUCAN) 150 MG tablet Take 1 tablet (150 mg total) by mouth daily. (Patient not taking: Reported on 05/03/2021)   HUMALOG 100 UNIT/ML injection Inject 0.1 mLs (10 Units total) into the skin 3 (three) times daily with meals.   insulin degludec (TRESIBA FLEXTOUCH) 100 UNIT/ML FlexTouch Pen Inject 20 Units into the skin daily.   Insulin Pen Needle (BD PEN NEEDLE MICRO U/F) 32G X 6 MM MISC DX:E11.65 USE TO CHECK BLOOD SUGARS THREE TIMES A DAY   Lancets Misc. MISC 1 each by Does not apply route 4 (four) times daily -  with meals and at bedtime.   linaclotide (LINZESS) 145 MCG CAPS capsule Take 1 capsule (145 mcg total) by mouth daily before breakfast.   mirabegron ER (MYRBETRIQ) 50 MG TB24 tablet Take 1 tablet (50 mg total) by mouth daily.   Multiple Vitamin (MULTIVITAMIN WITH MINERALS) TABS tablet Take 1 tablet by mouth daily.   ONETOUCH VERIO test strip USE TO test blood sugar FOUR TIMES DAILY   pantoprazole (PROTONIX) 40 MG tablet Take 1 tablet (40 mg total) by mouth daily.   pravastatin (PRAVACHOL) 40 MG tablet TAKE ONE TABLET BY MOUTH EVERYDAY AT BEDTIME (Patient taking differently: Take 40 mg by mouth daily.)   pregabalin (LYRICA) 50 MG capsule Take 1 capsule (50 mg total) by mouth 3 (three) times daily.   Vitamin D, Ergocalciferol, (DRISDOL) 1.25 MG (50000 UNIT) CAPS capsule TAKE ONE CAPSULE  BY MOUTH ONCE WEEKLY ON WEDNESDAY   No facility-administered encounter medications on file as of 05/12/2021.    Patient Active Problem List   Diagnosis Date Noted   Quadriceps weakness 02/06/2021   Syncope 11/29/2020   Mild aortic stenosis 11/29/2020   Irritable bowel syndrome with diarrhea 10/10/2020   Gastroesophageal reflux disease with esophagitis 10/10/2020   Postlaminectomy syndrome, not elsewhere classified 07/03/2020    S/P lumbar laminectomy 03/30/2020   Localized swelling, mass and lump, multiple sites 03/27/2018   Type II diabetes mellitus with renal manifestations ([place]) 12/27/2017   HLD (hyperlipidemia) 12/27/2017   HTN (hypertension) 12/27/2017   CKD (chronic kidney disease), stage III ([place]) 12/27/2017   Cough variant asthma 12/01/2015   Osteoarthritis of left knee, primary localized 08/17/2014   Knee osteoarthritis 08/17/2014   Hypoglycemia 11/15/2013   Other and unspecified hyperlipidemia 08/06/2013   Type II diabetes mellitus, uncontrolled ([place]) 07/20/2013   Varicose veins of lower extremities with other complications 25/63/8937   Fibromyalgia    GERD 05/18/2010   ABDOMINAL PAIN, GENERALIZED 05/02/2010   OBESITY 04/20/2010   BURSITIS, LEFT SHOULDER 03/09/2010   Lipoma of arm s/p excision 01/29/2014 01/10/2010   DEPRESSION 12/27/2009   BACK PAIN WITH RADICULOPATHY 12/27/2009   CYSTITIS, ACUTE 10/21/2009   MICROSCOPIC HEMATURIA 10/18/2009   KNEE PAIN, BILATERAL 10/18/2009   NEUROPATHY 09/08/2009   Asthma 09/08/2009   STRESS INCONTINENCE 09/08/2009   CHEST PAIN 07/19/2009   ESOPHAGEAL STRICTURE 08/27/2005    Conditions to be addressed/monitored: DM, HTN, HLD, s/p laminectomy   Care Plan : Manage Diabetes Type 2 (Adult)  Updates made by Gwendolyn Logan, RN since 05/12/2021 12:00 AM     Problem: Glycemic Management (Diabetes, Type 2)   Priority: High     Long-Range Goal: Glycemic Management Optimized   Start Date: 08/02/2020  Expected End Date: 08/02/2021  Recent Progress: On track  Priority: High  Note:   Objective:  Lab Results  Component Value Date   HGBA1C 11.8 (H) 04/13/2021   Lab Results  Component Value Date   CREATININE 1.19 (H) 04/13/2021   CREATININE 1.22 (H) 12/13/2020   CREATININE 1.04 (H) 12/01/2020   Lab Results  Component Value Date   EGFR 50 (L) 04/13/2021   Current Barriers:  Knowledge Deficits related to basic Diabetes pathophysiology and self  care/management Knowledge Deficits related to medications used for management of diabetes Case Manager Clinical Goal(s):  Patient will demonstrate improved adherence to prescribed treatment plan for diabetes self care/management as evidenced by:  daily monitoring and recording of CBG  adherence to ADA/ carb modified diet exercise 3-5 days/week adherence to prescribed medication regimen Interventions:  05/12/21 completed inbound call with patient  Collaboration with [name] FNP regarding development and update of comprehensive plan of care as evidenced by provider attestation and co-signature Inter-disciplinary care team collaboration (see longitudinal plan of care) Determined patient completed an in office visit with PCP provider [name], FNP Discussed PCP recommendations for referral to Endocrinology, Dr. Kelton Pillar Noted the referral states "New Request" and has not been processed  Sent in basket message to PCP provider and referral specialist requesting follow up on this referral  Advised patient once this referral has been processed, Dr. Quin Hoop office will contact her to scheduled a new patient appointment Discussed plans with patient for ongoing care management follow up and provided patient with direct contact information for care management team Patient Goals/Self-Care Activities Patient will:  Self administers oral medications as prescribed Self administers insulin as prescribed  Self administers injectable DM medication (Ozempic) as prescribed Attends all scheduled provider appointments Checks blood sugars as prescribed and utilize hyper and hypoglycemia protocol as needed Adheres to prescribed ADA/carb modified - pick up glucometer from pharmacy and resume monitoring cbg's promptly - follow hypoglycemic protocol as directed, if needed   - continue to adhere to Diabetic diet as directed   Follow Up Plan: Telephone follow up appointment with care management team  member scheduled for: 1027/22     Plan:Telephone follow up appointment with care management team member scheduled for:  06/22/21  [name], RN, BSN, CCM Care Management Coordinator [place] [facility]  Direct Phone: [phone]

## 2021-05-17 ENCOUNTER — Encounter: Payer: Self-pay | Admitting: Nurse Practitioner

## 2021-05-22 ENCOUNTER — Telehealth: Payer: Medicare Other

## 2021-05-22 ENCOUNTER — Ambulatory Visit: Payer: Self-pay

## 2021-05-22 DIAGNOSIS — E1165 Type 2 diabetes mellitus with hyperglycemia: Secondary | ICD-10-CM

## 2021-05-22 DIAGNOSIS — E782 Mixed hyperlipidemia: Secondary | ICD-10-CM

## 2021-05-22 DIAGNOSIS — I10 Essential (primary) hypertension: Secondary | ICD-10-CM

## 2021-05-22 DIAGNOSIS — Z9889 Other specified postprocedural states: Secondary | ICD-10-CM

## 2021-05-22 NOTE — Progress Notes (Signed)
This encounter was created in error - please disregard.

## 2021-05-23 ENCOUNTER — Encounter: Payer: Self-pay | Admitting: Nurse Practitioner

## 2021-05-23 ENCOUNTER — Telehealth: Payer: Self-pay

## 2021-05-23 DIAGNOSIS — N3020 Other chronic cystitis without hematuria: Secondary | ICD-10-CM | POA: Diagnosis not present

## 2021-05-23 NOTE — Chronic Care Management (AMB) (Signed)
Chronic Care Management Pharmacy Assistant   Name: [name]  MRN: [id number] DOB: [birth year]  Reason for Encounter: Medication Review/ Medication coordination  Recent office visits:  05-12-2021 [name], RN (CCM)  05-03-2021 [name], [place]. Shingrix given. START Kerendia 10 mg daily. INCREASE tresiba from 15 units to 20 units daily. STOP Spironolactone.  Recent consult visits:  None  Hospital visits:  Medication Reconciliation was completed by comparing discharge summary, patient's EMR and Pharmacy list, and upon discussion with patient.   Admitted to the hospital on 11-29-2020 due to Syncope. Discharge date was 12-01-2020. Discharged from [facility]?Medications Started at [facility] Discharge:?? Pantoprazole 40 mg daily   Medication Changes at Hospital Discharge: None   Medications Discontinued at Hospital Discharge: Spironolactone 50 mg Humalog 100 units   Medications that remain the same after Hospital Discharge:?? -All other medications will remain the same.    Medications: Outpatient Encounter Medications as of 05/23/2021  Medication Sig   amLODipine (NORVASC) 5 MG tablet Take 1 tablet (5 mg total) by mouth at bedtime.   aspirin EC 81 MG EC tablet Take 1 tablet (81 mg total) by mouth daily. Swallow whole.   blood glucose meter kit and supplies KIT Dispense based on patient and insurance preference. Check blood sugars 4 times daily E11.69   Continuous Blood Gluc Receiver (FREESTYLE LIBRE READER) DEVI 1 each by Does not apply route See admin instructions. CGM device-Freestyle Libre   Continuous Blood Gluc Sensor (FREESTYLE LIBRE 14 DAY SENSOR) MISC 1 each by Does not apply route See admin instructions. CGM 14-day sensors; send refills to [employer] (in Epic)   Finerenone (KERENDIA) 10 MG TABS Take 1 tablet by mouth daily.   fluconazole (DIFLUCAN) 150 MG tablet Take 1 tablet (150 mg total) by mouth daily. (Patient not taking:  Reported on 05/03/2021)   HUMALOG 100 UNIT/ML injection Inject 0.1 mLs (10 Units total) into the skin 3 (three) times daily with meals.   insulin degludec (TRESIBA FLEXTOUCH) 100 UNIT/ML FlexTouch Pen Inject 20 Units into the skin daily.   Insulin Pen Needle (BD PEN NEEDLE MICRO U/F) 32G X 6 MM MISC DX:E11.65 USE TO CHECK BLOOD SUGARS THREE TIMES A DAY   Lancets Misc. MISC 1 each by Does not apply route 4 (four) times daily -  with meals and at bedtime.   linaclotide (LINZESS) 145 MCG CAPS capsule Take 1 capsule (145 mcg total) by mouth daily before breakfast.   mirabegron ER (MYRBETRIQ) 50 MG TB24 tablet Take 1 tablet (50 mg total) by mouth daily.   Multiple Vitamin (MULTIVITAMIN WITH MINERALS) TABS tablet Take 1 tablet by mouth daily.   ONETOUCH VERIO test strip USE TO test blood sugar FOUR TIMES DAILY   pantoprazole (PROTONIX) 40 MG tablet Take 1 tablet (40 mg total) by mouth daily.   pravastatin (PRAVACHOL) 40 MG tablet TAKE ONE TABLET BY MOUTH EVERYDAY AT BEDTIME (Patient taking differently: Take 40 mg by mouth daily.)   pregabalin (LYRICA) 50 MG capsule Take 1 capsule (50 mg total) by mouth 3 (three) times daily.   Vitamin D, Ergocalciferol, (DRISDOL) 1.25 MG (50000 UNIT) CAPS capsule TAKE ONE CAPSULE BY MOUTH ONCE WEEKLY ON WEDNESDAY   No facility-administered encounter medications on file as of 05/23/2021.   Reviewed chart for medication changes ahead of medication coordination call.  No OVs, Consults, or hospital visits since last care coordination call/Pharmacist visit. (If appropriate, list visit date, provider name)  No medication  changes indicated OR if recent visit, treatment plan here.  BP Readings from Last 3 Encounters:  05/03/21 140/72  04/13/21 134/80  03/10/21 (!) 146/77    Lab Results  Component Value Date   HGBA1C 11.8 (H) 04/13/2021     Patient obtains medications through Adherence Packaging  30 Days   Last adherence delivery included: Women's 50 Plus  Multivitamin- 1 tablet daily (breakfast) Ergocalciferol 50,000 units- 1 capsule weekly on Wednesdays (breakfast) Pravastatin 40 mg- 1 tablet daily (bedtime) Linzess 145 mcg- 1 tablet daily(before breakfast) Amlodipine 5 mg- 1 tablet daily (bedtime) Pantoprazole 40 mg- 1 tablet daily (breakfast) Humalog Kwik Injection pen 25 units  Patient declined (meds) last month: Nitrofurantoin 100 mg BID- Completed antibiotic Farxiga 5 mg BID- Discontinued   Explanation of abundance on hand (ie #30 due to overlapping fills or previous adherence issues etc)  Patient is due for next adherence delivery on: 06-02-2021  Called patient and reviewed medications and coordinated delivery.  This delivery to include: Pravastatin 40 mg- 1 tablet daily (bedtime) Amlodipine 5 mg- 1 tablet daily (bedtime) Pantoprazole 40 mg- 1 tablet daily (breakfast) Linzess 145 mcg- 1 tablet daily(before breakfast) Women's 50 Plus Multivitamin- 1 tablet daily (breakfast) Ergocalciferol 50,000 units- 1 capsule weekly on Wednesdays (breakfast)  Patient declined the following medications: None  Patient needs refills for: None  Acute fill for kerendia 10 mg on 06-10-2021  Confirmed delivery date of 06-02-2021, advised patient that pharmacy will contact them the morning of delivery.   Care Gaps: Covid booster overdue Ophthalmology exam overdue Foot exam overdue Medicare wellness 01-14-2022  Star Rating Drugs: Pravastatin 40 mg- Last filled 05-01-2021 30 [facility]  [place] Clinical Pharmacist Assistant [phone]

## 2021-05-26 DIAGNOSIS — I10 Essential (primary) hypertension: Secondary | ICD-10-CM

## 2021-05-26 DIAGNOSIS — E1165 Type 2 diabetes mellitus with hyperglycemia: Secondary | ICD-10-CM

## 2021-05-26 DIAGNOSIS — E782 Mixed hyperlipidemia: Secondary | ICD-10-CM

## 2021-05-29 NOTE — Chronic Care Management (AMB) (Signed)
Chronic Care Management   CCM RN Visit Note  05/22/2021 Name: DELANIA FERG MRN: [id number] DOB: 10-Feb-1951  Subjective: ALANA DAYTON is a 70 y.o. year old female who is a primary care patient of [name], [place]. The care management team was consulted for assistance with disease management and care coordination needs.    Engaged with patient by telephone for follow up visit in response to provider referral for case management and/or care coordination services.   Consent to Services:  The patient was given information about Chronic Care Management services, agreed to services, and gave verbal consent prior to initiation of services.  Please see initial visit note for detailed documentation.   Patient agreed to services and verbal consent obtained.   Assessment: Review of patient past medical history, allergies, medications, health status, including review of consultants reports, laboratory and other test data, was performed as part of comprehensive evaluation and provision of chronic care management services.   SDOH (Social Determinants of Health) assessments and interventions performed:    CCM Care Plan  Allergies  Allergen Reactions   Pollen Extract Other (See Comments)    Congestion/sneezing   Tomato Hives and Itching   Codeine Hives, Itching and Nausea And Vomiting    Outpatient Encounter Medications as of 05/22/2021  Medication Sig   amLODipine (NORVASC) 5 MG tablet Take 1 tablet (5 mg total) by mouth at bedtime.   aspirin EC 81 MG EC tablet Take 1 tablet (81 mg total) by mouth daily. Swallow whole.   blood glucose meter kit and supplies KIT Dispense based on patient and insurance preference. Check blood sugars 4 times daily E11.69   Continuous Blood Gluc Receiver (FREESTYLE LIBRE READER) DEVI 1 each by Does not apply route See admin instructions. CGM device-Freestyle Libre   Continuous Blood Gluc Sensor (FREESTYLE LIBRE 14 DAY SENSOR) MISC 1 each by Does not  apply route See admin instructions. CGM 14-day sensors; send refills to [employer] (in Epic)   Finerenone (KERENDIA) 10 MG TABS Take 1 tablet by mouth daily.   fluconazole (DIFLUCAN) 150 MG tablet Take 1 tablet (150 mg total) by mouth daily. (Patient not taking: Reported on 05/03/2021)   HUMALOG 100 UNIT/ML injection Inject 0.1 mLs (10 Units total) into the skin 3 (three) times daily with meals.   insulin degludec (TRESIBA FLEXTOUCH) 100 UNIT/ML FlexTouch Pen Inject 20 Units into the skin daily.   Insulin Pen Needle (BD PEN NEEDLE MICRO U/F) 32G X 6 MM MISC DX:E11.65 USE TO CHECK BLOOD SUGARS THREE TIMES A DAY   Lancets Misc. MISC 1 each by Does not apply route 4 (four) times daily -  with meals and at bedtime.   linaclotide (LINZESS) 145 MCG CAPS capsule Take 1 capsule (145 mcg total) by mouth daily before breakfast.   mirabegron ER (MYRBETRIQ) 50 MG TB24 tablet Take 1 tablet (50 mg total) by mouth daily.   Multiple Vitamin (MULTIVITAMIN WITH MINERALS) TABS tablet Take 1 tablet by mouth daily.   ONETOUCH VERIO test strip USE TO test blood sugar FOUR TIMES DAILY   pantoprazole (PROTONIX) 40 MG tablet Take 1 tablet (40 mg total) by mouth daily.   pravastatin (PRAVACHOL) 40 MG tablet TAKE ONE TABLET BY MOUTH EVERYDAY AT BEDTIME (Patient taking differently: Take 40 mg by mouth daily.)   pregabalin (LYRICA) 50 MG capsule Take 1 capsule (50 mg total) by mouth 3 (three) times daily.   Vitamin D, Ergocalciferol, (DRISDOL) 1.25 MG (50000 UNIT) CAPS capsule TAKE ONE CAPSULE  BY MOUTH ONCE WEEKLY ON WEDNESDAY   No facility-administered encounter medications on file as of 05/22/2021.    Patient Active Problem List   Diagnosis Date Noted   Quadriceps weakness 02/06/2021   Syncope 11/29/2020   Mild aortic stenosis 11/29/2020   Irritable bowel syndrome with diarrhea 10/10/2020   Gastroesophageal reflux disease with esophagitis 10/10/2020   Postlaminectomy syndrome, not elsewhere classified 07/03/2020    S/P lumbar laminectomy 03/30/2020   Localized swelling, mass and lump, multiple sites 03/27/2018   Type II diabetes mellitus with renal manifestations ([place]) 12/27/2017   HLD (hyperlipidemia) 12/27/2017   HTN (hypertension) 12/27/2017   CKD (chronic kidney disease), stage III ([place]) 12/27/2017   Cough variant asthma 12/01/2015   Osteoarthritis of left knee, primary localized 08/17/2014   Knee osteoarthritis 08/17/2014   Hypoglycemia 11/15/2013   Other and unspecified hyperlipidemia 08/06/2013   Type II diabetes mellitus, uncontrolled 07/20/2013   Varicose veins of lower extremities with other complications 38/05/1750   Fibromyalgia    GERD 05/18/2010   ABDOMINAL PAIN, GENERALIZED 05/02/2010   OBESITY 04/20/2010   BURSITIS, LEFT SHOULDER 03/09/2010   Lipoma of arm s/p excision 01/29/2014 01/10/2010   DEPRESSION 12/27/2009   BACK PAIN WITH RADICULOPATHY 12/27/2009   CYSTITIS, ACUTE 10/21/2009   MICROSCOPIC HEMATURIA 10/18/2009   KNEE PAIN, BILATERAL 10/18/2009   NEUROPATHY 09/08/2009   Asthma 09/08/2009   STRESS INCONTINENCE 09/08/2009   CHEST PAIN 07/19/2009   ESOPHAGEAL STRICTURE 08/27/2005    Conditions to be addressed/monitored: DM, HTN, HLD, s/p laminectomy   Care Plan : Manage Diabetes Type 2 (Adult)  Updates made by [name], RN since 05/22/2021 12:00 AM     Problem: Glycemic Management (Diabetes, Type 2)   Priority: High     Long-Range Goal: Glycemic Management Optimized   Start Date: 08/02/2020  Expected End Date: 08/02/2021  Recent Progress: On track  Priority: High  Note:   Objective:  Lab Results  Component Value Date   HGBA1C 11.8 (H) 04/13/2021   Lab Results  Component Value Date   CREATININE 1.19 (H) 04/13/2021   CREATININE 1.22 (H) 12/13/2020   CREATININE 1.04 (H) 12/01/2020   Lab Results  Component Value Date   EGFR 50 (L) 04/13/2021   Current Barriers:  Knowledge Deficits related to basic Diabetes pathophysiology and self  care/management Knowledge Deficits related to medications used for management of diabetes Case Manager Clinical Goal(s):  Patient will demonstrate improved adherence to prescribed treatment plan for diabetes self care/management as evidenced by:  daily monitoring and recording of CBG  adherence to ADA/ carb modified diet exercise 3-5 days/week adherence to prescribed medication regimen Interventions:  05/22/21 completed inbound call with patient  Collaboration with [name] FNP regarding development and update of comprehensive plan of care as evidenced by provider attestation and co-signature Inter-disciplinary care team collaboration (see longitudinal plan of care) Determined patient is calling today to advise she has not received a call from Endocrinology following a recent referral from PCP Reviewed chart and noted referral was sent and has been authorized to Dr. Vernetta Honey [facility] Provided patient with the contact information for Dr. Kelton Pillar, instructed patient to contact this provider to schedule a new patient appointment when ready due the referral has been sent to this provider and has been authorized by her insurance, patient verbalizes understanding and recorded the contact number provided  Discussed plans with patient for ongoing care management follow up and provided patient with direct contact information for care management team Patient Goals/Self-Care Activities  Patient will:  Self administers oral medications as prescribed Self administers insulin as prescribed Self administers injectable DM medication (Ozempic) as prescribed Attends all scheduled provider appointments Checks blood sugars as prescribed and utilize hyper and hypoglycemia protocol as needed Adheres to prescribed ADA/carb modified - pick up glucometer from pharmacy and resume monitoring cbg's promptly - follow hypoglycemic protocol as directed, if needed   - continue to adhere to Diabetic diet as  directed  - contact Dr. Vernetta Honey [facility] to schedule a new patient appointment   Follow Up Plan: Telephone follow up appointment with care management team member scheduled for: 1027/22     Plan:Telephone follow up appointment with care management team member scheduled for:  06/22/21  [name], RN, BSN, CCM Care Management Coordinator [place] [facility]  Direct Phone: [phone]

## 2021-05-29 NOTE — Patient Instructions (Signed)
Visit Information  PATIENT GOALS:  Goals Addressed      Monitor and Manage My Blood Sugar-Diabetes Type 2   On track    Timeframe:  Long-Range Goal Priority:  High Start Date: 08/02/20                            Expected End Date: 08/02/21                     Follow Up Date: 06/22/21  - check blood sugar at prescribed times - check blood sugar before and after exercise - check blood sugar if I feel it is too high or too low - enter blood sugar readings and medication or insulin into daily log - take the blood sugar log to all doctor visits - take the blood sugar meter to all doctor visits  - call Dr. Kelton Pillar, Endocrinology to schedule a new patient appointment    Why is this important?   Checking your blood sugar at home helps to keep it from getting very high or very low.  Writing the results in a diary or log helps the doctor know how to care for you.  Your blood sugar log should have the time, date and the results.  Also, write down the amount of insulin or other medicine that you take.  Other information, like what you ate, exercise done and how you were feeling, will also be helpful.     Notes:      The patient verbalized understanding of instructions, educational materials, and care plan provided today and declined offer to receive copy of patient instructions, educational materials, and care plan.   Telephone follow up appointment with care management team member scheduled for: 06/22/21   [name], RN, BSN, CCM Care Management Coordinator [place] [facility]  Direct Phone: [phone]

## 2021-05-30 ENCOUNTER — Encounter (HOSPITAL_COMMUNITY): Payer: Self-pay

## 2021-05-30 ENCOUNTER — Other Ambulatory Visit: Payer: Self-pay

## 2021-05-30 ENCOUNTER — Ambulatory Visit (HOSPITAL_COMMUNITY)
Admission: EM | Admit: 2021-05-30 | Discharge: 2021-05-30 | Disposition: A | Discharge status: Home / Self Care | Payer: Medicare Other | Attending: Family Medicine | Admitting: Family Medicine

## 2021-05-30 DIAGNOSIS — R32 Unspecified urinary incontinence: Secondary | ICD-10-CM | POA: Diagnosis present

## 2021-05-30 DIAGNOSIS — R102 Pelvic and perineal pain: Secondary | ICD-10-CM

## 2021-05-30 DIAGNOSIS — Z794 Long term (current) use of insulin: Secondary | ICD-10-CM

## 2021-05-30 DIAGNOSIS — B3731 Acute candidiasis of vulva and vagina: Secondary | ICD-10-CM

## 2021-05-30 DIAGNOSIS — E1165 Type 2 diabetes mellitus with hyperglycemia: Secondary | ICD-10-CM

## 2021-05-30 LAB — POCT URINALYSIS DIPSTICK, ED / UC
Bilirubin Urine: NEGATIVE
Glucose, UA: >=1000 mg/dL — AB
Ketones, ur: NEGATIVE mg/dL
Nitrite: NEGATIVE
Protein, ur: NEGATIVE mg/dL
Specific Gravity, Urine: 1.015 (ref 1.005–1.030)
Urobilinogen, UA: 0.2 mg/dL (ref 0.0–1.0)
pH: 7.5 (ref 5.0–8.0)

## 2021-05-30 MED ORDER — CEPHALEXIN 500 MG PO CAPS: 500.0000 mg | ORAL_CAPSULE | Freq: Two times a day (BID) | ORAL | 0 refills | Status: DC

## 2021-05-30 MED ORDER — NYSTATIN 100000 UNIT/GM EX CREA: TOPICAL_CREAM | CUTANEOUS | 2 refills | Status: DC

## 2021-05-30 MED ORDER — FLUCONAZOLE 150 MG PO TABS: 150.0000 mg | ORAL_TABLET | Freq: Every day | ORAL | 0 refills | Status: DC

## 2021-05-30 NOTE — ED Triage Notes (Signed)
Pt c/o vaginal swelling and severe pain x1wk. States was treated for yeast 2wks ago with relief at that time. States wears depends and has chronic yeast infections.

## 2021-05-30 NOTE — ED Provider Notes (Signed)
[place]    CSN: [id number] Arrival date & time: 05/30/21  1156      History   Chief Complaint Chief Complaint  Patient presents with   Groin Swelling    HPI [name] is a 70 y.o. female.   Patient presenting today with 1 week history of worsening thick white vaginal discharge, vaginal itching, vaginal irritation, swelling.  Denies pelvic pain, vaginal bleeding, dysuria, hematuria, fever, chills, nausea, vomiting.  She does have urinary frequency and urgency, incontinence but she states this is a chronic issue for her for which she is followed by urology.  Does have history of recurrent UTIs additionally.  She was just treated for acute on chronic yeast vaginitis about a week ago by her primary care provider who also switched her several days ago off of [place] for her diabetes due to her chronic yeast infections.  She states her blood sugars have been significantly elevated lately in the high 200s and 300s consistently despite taking her medications compliantly and watching her diet.  She is awaiting an appointment to establish with endocrinology for further management of this at this time.   Past Medical History:  Diagnosis Date   Aortic stenosis    mild on 04/07/19 echo   Arthritis    L knee, hands, back    Asthma    Diabetes mellitus type 2, insulin dependent (HCC)    Fibromyalgia    Full dentures    Full dentures    GERD (gastroesophageal reflux disease)    H/O hiatal hernia    Headache    h/o migraines - was followed for a time with a wellness doctor   History of esophageal dilatation    History of rhabdomyolysis    03/ 2015   Hyperlipidemia    Hypertension    Insulin pump in place    since 12/ 2014--  [employer]   Lumbar disc herniation    right leg weakness   Osteoarthritis of left knee, primary localized 08/17/2014   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   Renal insufficiency    Rhabdomyolysis 11/15/2013   SUI (stress urinary incontinence, female)     Syncope 12/27/2017   Wears glasses    Wears glasses     Patient Active Problem List   Diagnosis Date Noted   Quadriceps weakness 02/06/2021   Syncope 11/29/2020   Mild aortic stenosis 11/29/2020   Irritable bowel syndrome with diarrhea 10/10/2020   Gastroesophageal reflux disease with esophagitis 10/10/2020   Postlaminectomy syndrome, not elsewhere classified 07/03/2020   S/P lumbar laminectomy 03/30/2020   Localized swelling, mass and lump, multiple sites 03/27/2018   Type II diabetes mellitus with renal manifestations ([place]) 12/27/2017   HLD (hyperlipidemia) 12/27/2017   HTN (hypertension) 12/27/2017   CKD (chronic kidney disease), stage III ([place]) 12/27/2017   Cough variant asthma 12/01/2015   Osteoarthritis of left knee, primary localized 08/17/2014   Knee osteoarthritis 08/17/2014   Hypoglycemia 11/15/2013   Other and unspecified hyperlipidemia 08/06/2013   Type II diabetes mellitus, uncontrolled 07/20/2013   Varicose veins of lower extremities with other complications 93/79/0240   Fibromyalgia    GERD 05/18/2010   ABDOMINAL PAIN, GENERALIZED 05/02/2010   OBESITY 04/20/2010   BURSITIS, LEFT SHOULDER 03/09/2010   Lipoma of arm s/p excision 01/29/2014 01/10/2010   DEPRESSION 12/27/2009   BACK PAIN WITH RADICULOPATHY 12/27/2009   CYSTITIS, ACUTE 10/21/2009   MICROSCOPIC HEMATURIA 10/18/2009   KNEE PAIN, BILATERAL 10/18/2009   NEUROPATHY 09/08/2009   Asthma 09/08/2009  STRESS INCONTINENCE 09/08/2009   CHEST PAIN 07/19/2009   ESOPHAGEAL STRICTURE 08/27/2005    Past Surgical History:  Procedure Laterality Date   BLADDER SUSPENSION N/A 03/09/2014   Procedure: CYSTOSCOPY/SLING;  Surgeon: Reece Packer, MD;  Location: [facility];  Service: Urology;  Laterality: N/A;   CARDIAC CATHETERIZATION  07-20-2009   [facility] [place]  NORMAL  LVEF/  NORMAL CORONARY AND RENAL ARTERIES   CARPAL TUNNEL RELEASE Right 2000   CHOLECYSTECTOMY  1990    COLONOSCOPY WITH ESOPHAGOGASTRODUODENOSCOPY (EGD)  06-18-2002   ESOPHAGOGASTRODUODENOSCOPY (EGD) WITH ESOPHAGEAL DILATION  06-12-2010   EXCISION LEFT UPPER ARM MASS  01-29-2014   EYE SURGERY  2010   laser on R eye   INTERSTIM IMPLANT REMOVAL N/A 03/06/2019   Procedure: REMOVAL OF [place] IMPLANT;  Surgeon: Bjorn Loser, MD;  Location: [facility];  Service: Urology;  Laterality: N/A;   KNEE ARTHROSCOPY Right 1989  &  2002   LUMBAR LAMINECTOMY N/A 02/19/2020   Procedure: right L4 hemiaminectomy, microdiscectomy;  Surgeon: Marybelle Killings, MD;  Location: [place];  Service: Orthopedics;  Laterality: N/A;   MULTIPLE TOOTH EXTRACTIONS     NEGATIVE SLEEP STUDY  2012   PER PT   PARTIAL KNEE ARTHROPLASTY Left 08/17/2014   Procedure: LEFT UNICOMPARTMENTAL KNEE;  Surgeon: Johnny Bridge, MD;  Location: [facility];  Service: Orthopedics;  Laterality: Left;    OB History     Gravida  1   Para  1   Term  1   Preterm      AB      Living  1      SAB      IAB      Ectopic      Multiple      Live Births  1            Home Medications    Prior to Admission medications   Medication Sig Start Date End Date Taking? Authorizing Provider  cephALEXin (KEFLEX) 500 MG capsule Take 1 capsule (500 mg total) by mouth 2 (two) times daily. 05/30/21  Yes Volney American, PA-C  nystatin cream (MYCOSTATIN) Apply to affected area 2 times daily 05/30/21  Yes Volney American, PA-C  amLODipine (NORVASC) 5 MG tablet Take 1 tablet (5 mg total) by mouth at bedtime. 04/27/21   [name], FNP  aspirin EC 81 MG EC tablet Take 1 tablet (81 mg total) by mouth daily. Swallow whole. 12/01/20   Regalado, Belkys A, MD  blood glucose meter kit and supplies KIT Dispense based on patient and insurance preference. Check blood sugars 4 times daily E11.69 12/29/20   [name], FNP  Continuous Blood Gluc Receiver (FREESTYLE LIBRE READER) DEVI 1 each by Does not apply route See admin instructions. CGM  device-[employer]estyle [employer], Historical, MD  Continuous Blood Gluc Sensor (FREESTYLE LIBRE 14 DAY SENSOR) MISC 1 each by Does not apply route See admin instructions. CGM 14-day sensors; send refills to [employer] (in [place])    [provider]  Finerenone (KERENDIA) 10 MG TABS Take 1 tablet by mouth daily. 05/03/21   [name], FNP  fluconazole (DIFLUCAN) 150 MG tablet Take 1 tablet (150 mg total) by mouth daily. 05/30/21   Volney American, PA-C  HUMALOG 100 UNIT/ML injection Inject 0.1 mLs (10 Units total) into the skin 3 (three) times daily with meals. 05/10/21   [name], FNP  insulin degludec [facility])  100 UNIT/ML FlexTouch Pen Inject 20 Units into the skin daily. 05/03/21   [name], FNP  Insulin Pen Needle (BD PEN NEEDLE MICRO U/F) 32G X 6 MM MISC DX:E11.65 USE TO CHECK BLOOD SUGARS THREE TIMES A DAY 05/12/20   [name], MD  Lancets Misc. MISC 1 each by Does not apply route 4 (four) times daily -  with meals and at bedtime. 12/16/18   [name], FNP  linaclotide [facility]) 145 MCG CAPS capsule Take 1 capsule (145 mcg total) by mouth daily before breakfast. 12/29/20   [name], FNP  mirabegron ER (MYRBETRIQ) 50 MG TB24 tablet Take 1 tablet (50 mg total) by mouth daily. 05/27/19   [name], FNP  Multiple Vitamin (MULTIVITAMIN WITH MINERALS) TABS tablet Take 1 tablet by mouth daily.    [provider]  [facility] VERIO test strip USE TO test blood sugar FOUR TIMES DAILY 02/15/21   [name], FNP  pantoprazole (PROTONIX) 40 MG tablet Take 1 tablet (40 mg total) by mouth daily. 12/29/20   [name], FNP  pravastatin (PRAVACHOL) 40 MG tablet TAKE ONE TABLET BY MOUTH EVERYDAY AT BEDTIME Patient taking differently: Take 40 mg by mouth daily. 10/24/20   [name], FNP  pregabalin (LYRICA) 50 MG capsule Take 1 capsule (50 mg total) by mouth 3 (three) times daily. 04/10/21   [name], FNP  Vitamin D, Ergocalciferol, (DRISDOL)  1.25 MG (50000 UNIT) CAPS capsule TAKE ONE CAPSULE BY MOUTH ONCE WEEKLY ON [facility] 12/29/20   [name], FNP    Family History Family History  Problem Relation Age of Onset   Diabetes Mother    Hypertension Mother    Lung cancer Mother        smoked   Diabetes Father    Hypertension Father    Lung cancer Father        smoked   Diabetes Brother    Heart failure Sister    Diabetes Maternal Grandmother    Cancer Maternal Grandmother     Social History Social History   Tobacco Use   Smoking status: Never   Smokeless tobacco: Never  Vaping Use   Vaping Use: Never used  Substance Use Topics   Alcohol use: No   Drug use: No     Allergies   Pollen extract, Tomato, and Codeine   Review of Systems Review of Systems Per HPI  Physical Exam Triage Vital Signs ED Triage Vitals [05/30/21 1251]  Enc Vitals Group     BP (!) 168/97     Pulse Rate 84     Resp 18     Temp 98.2 F (36.8 C)     Temp Source Oral     SpO2 96 %     Weight      Height      Head Circumference      Peak Flow      Pain Score 7     Pain Loc      Pain Edu?      Excl. in [place]?    No data found.  Updated Vital Signs BP (!) 168/97   Pulse 84   Temp 98.2 F (36.8 C) (Oral)   Resp 18   LMP  (LMP Unknown)   SpO2 96%   Visual Acuity Right Eye Distance:   Left Eye Distance:   Bilateral Distance:    Right Eye Near:   Left Eye Near:    Bilateral Near:     Physical Exam Vitals  and nursing note reviewed. Exam conducted with a chaperone present.  Constitutional:      Appearance: Normal appearance. She is not ill-appearing.  HENT:     Head: Atraumatic.  Eyes:     Extraocular Movements: Extraocular movements intact.     Conjunctiva/sclera: Conjunctivae normal.  Cardiovascular:     Rate and Rhythm: Normal rate and regular rhythm.     Heart sounds: Normal heart sounds.  Pulmonary:     Effort: Pulmonary effort is normal.     Breath sounds: Normal breath sounds.  Abdominal:      General: Bowel sounds are normal. There is no distension.     Palpations: Abdomen is soft.     Tenderness: There is no abdominal tenderness. There is no guarding.  Genitourinary:    Comments: Bilateral external labia's erythematous, edematous with thick white vaginal discharge present.  Significant tenderness to palpation diffusely Musculoskeletal:        General: Normal range of motion.     Cervical back: Normal range of motion and neck supple.  Skin:    General: Skin is warm and dry.  Neurological:     Mental Status: She is alert and oriented to person, place, and time.  Psychiatric:        Mood and Affect: Mood normal.        Thought Content: Thought content normal.        Judgment: Judgment normal.   UC Treatments / Results  Labs (all labs ordered are listed, but only abnormal results are displayed) Labs Reviewed  POCT URINALYSIS DIPSTICK, ED / UC - Abnormal; Notable for the following components:      Result Value   Glucose, UA >=1000 (*)    Hgb urine dipstick SMALL (*)    Leukocytes,Ua SMALL (*)    All other components within normal limits  URINE CULTURE  CERVICOVAGINAL ANCILLARY ONLY    EKG   Radiology No results found.  Procedures Procedures (including critical care time)  Medications Ordered in UC Medications - No data to display  Initial Impression / Assessment and Plan / UC Course  I have reviewed the triage vital signs and the nursing notes.  Pertinent labs & imaging results that were available during my care of the patient were reviewed by me and considered in my medical decision making (see chart for details).     Mildly hypertensive but otherwise vital signs reassuring.  Appears to have an extensive yeast infection of the external labia was, likely secondary to uncontrolled blood sugars and Farxiga which was just stopped several days ago.  Vaginal swab pending for confirmation, will treat with 2 weeks of daily Diflucan and nystatin cream in addition to  continue to work on blood sugar control.  UA with small leukocytes, will cover with Keflex for now while we wait for the urine culture to return.  This will also cover for a secondary bacterial infection in her vaginal area given the extent of the irritation and swelling of the labia's.  Close GYN follow-up recommended, return sooner for worsening symptoms.  Final Clinical Impressions(s) / UC Diagnoses   Final diagnoses:  Yeast vaginitis  Type 2 diabetes mellitus with hyperglycemia, with long-term current use of insulin (HCC)  Vaginal pain  Urinary incontinence, unspecified type  Vaginal candidiasis   Discharge Instructions   None    ED Prescriptions     Medication Sig Dispense Auth. Provider   cephALEXin (KEFLEX) 500 MG capsule Take 1 capsule (500 mg total) by mouth  2 (two) times daily. 10 capsule Volney American, PA-C   fluconazole (DIFLUCAN) 150 MG tablet Take 1 tablet (150 mg total) by mouth daily. 14 tablet [place], [place]   nystatin cream (MYCOSTATIN) Apply to affected area 2 times daily 80 g Volney American, [place]      PDMP not reviewed this encounter.   Volney American, [place] 05/30/21 1428

## 2021-05-31 ENCOUNTER — Encounter: Payer: Self-pay | Admitting: Nurse Practitioner

## 2021-05-31 DIAGNOSIS — N3020 Other chronic cystitis without hematuria: Secondary | ICD-10-CM | POA: Diagnosis not present

## 2021-05-31 LAB — URINE CULTURE

## 2021-06-01 ENCOUNTER — Telehealth (HOSPITAL_COMMUNITY): Payer: Self-pay | Admitting: Emergency Medicine

## 2021-06-01 LAB — CERVICOVAGINAL ANCILLARY ONLY
Bacterial Vaginitis (gardnerella): NEGATIVE
Candida Glabrata: POSITIVE — AB
Candida Vaginitis: POSITIVE — AB
Comment: NEGATIVE
Comment: NEGATIVE
Comment: NEGATIVE

## 2021-06-02 ENCOUNTER — Telehealth (HOSPITAL_COMMUNITY): Payer: Self-pay | Admitting: Emergency Medicine

## 2021-06-02 NOTE — Telephone Encounter (Signed)
Spoke with pt to advice per Olene Craven, PA okay to finish keflex and must complete diflucan. Pt states needs to speak with nurse that called her yesterday because she was told that RX was sent to Pharmacy. Advised her may not be contacted back until Monday.

## 2021-06-04 DIAGNOSIS — Z794 Long term (current) use of insulin: Secondary | ICD-10-CM | POA: Diagnosis not present

## 2021-06-04 DIAGNOSIS — E118 Type 2 diabetes mellitus with unspecified complications: Secondary | ICD-10-CM | POA: Diagnosis not present

## 2021-06-05 ENCOUNTER — Telehealth: Payer: Self-pay

## 2021-06-05 NOTE — Chronic Care Management (AMB) (Signed)
Chronic Care Management Pharmacy Assistant   Name: [name]  MRN: [id number] DOB: [birth year]  06/05/2021 -  APPOINTMENT REMINDER/ ED FOLLOW UP  [name] was reminded to have all medications, supplements and any blood glucose and blood pressure readings available for review with [name], Pharm. D, at her telephone visit on 06/06/2021 at 2:00 PM.  Questions: Have you had any recent office visit or specialist visit outside of [place]? No  Are there any concerns you would like to discuss during your office visit? Blood sugar levels.  Are you having any problems obtaining your medications? (Whether it pharmacy issues or cost) No  If patient has any PAP medications ask if they are having any problems getting their PAP medication or refill? No PAP  Care Gaps: OPHTHALMOLOGY EXAM- Overdue - never done  Zoster Vaccines- Shingrix (1 of 2)- never done   FOOT EXAM (Yearly)- Last completed: Jul 22, 2019 COVID-19 Vaccine (4 - Booster)- Last completed: May 24, 2020 Annual Wellness visit completed on 01/11/2021 Colonoscopy: completed 12/12/2016 Mammogram: completed 04/07/2021 Bone Density: completed 07/10/2019  Star Rating Drug: Pravastatin 40 mg- Last filled 05-30-2021 30 [facility]  Any gaps in medications fill history? No  Hospital visits:  Medication Reconciliation was completed by comparing discharge summary, patient's EMR and Pharmacy list, and upon discussion with patient.  Admitted to the hospital on 05/30/2021 due to Yeast Vaginitis/Type 2 diabetes mellitus with hyperglycemia, Vaginal pain, Urinary incontinence and Vaginal candidiasis. Discharge date was 05/30/2021. Discharged from [facility].    New?Medications Started at [facility] Discharge:?? -started Cephalexin 500 mg- 1 tablet two times daily for 5 days and Nystatin 100000 unit/gm- apply to affected area 2 times daily due to Yeast Vaginitis/Type 2 diabetes mellitus  with hyperglycemia, Vaginal pain, Urinary incontinence and Vaginal candidiasis.  Medication Changes at Hospital Discharge: -Changed : None  Medications Discontinued at Hospital Discharge: -Stopped: None  Medications that remain the same after Hospital Discharge:??  -All other medications will remain the same.    Medications: Outpatient Encounter Medications as of 06/05/2021  Medication Sig   amLODipine (NORVASC) 5 MG tablet Take 1 tablet (5 mg total) by mouth at bedtime.   aspirin EC 81 MG EC tablet Take 1 tablet (81 mg total) by mouth daily. Swallow whole.   blood glucose meter kit and supplies KIT Dispense based on patient and insurance preference. Check blood sugars 4 times daily E11.69   cephALEXin (KEFLEX) 500 MG capsule Take 1 capsule (500 mg total) by mouth 2 (two) times daily.   Continuous Blood Gluc Receiver (FREESTYLE LIBRE READER) DEVI 1 each by Does not apply route See admin instructions. CGM device-Freestyle Libre   Continuous Blood Gluc Sensor (FREESTYLE LIBRE 14 DAY SENSOR) MISC 1 each by Does not apply route See admin instructions. CGM 14-day sensors; send refills to [employer] (in [employer])   Finerenone (KERENDIA) 10 MG TABS Take 1 tablet by mouth daily.   fluconazole (DIFLUCAN) 150 MG tablet Take 1 tablet (150 mg total) by mouth daily.   HUMALOG 100 UNIT/ML injection Inject 0.1 mLs (10 Units total) into the skin 3 (three) times daily with meals.   insulin degludec (TRESIBA FLEXTOUCH) 100 UNIT/ML FlexTouch Pen Inject 20 Units into the skin daily.   Insulin Pen Needle (BD PEN NEEDLE MICRO U/F) 32G X 6 MM MISC DX:E11.65 USE TO CHECK BLOOD SUGARS THREE TIMES A DAY   Lancets Misc. MISC 1 each by Does not apply route 4 (  four) times daily -  with meals and at bedtime.   linaclotide (LINZESS) 145 MCG CAPS capsule Take 1 capsule (145 mcg total) by mouth daily before breakfast.   mirabegron ER (MYRBETRIQ) 50 MG TB24 tablet Take 1 tablet (50 mg total) by mouth daily.   Multiple  Vitamin (MULTIVITAMIN WITH MINERALS) TABS tablet Take 1 tablet by mouth daily.   nystatin cream (MYCOSTATIN) Apply to affected area 2 times daily   ONETOUCH VERIO test strip USE TO test blood sugar FOUR TIMES DAILY   pantoprazole (PROTONIX) 40 MG tablet Take 1 tablet (40 mg total) by mouth daily.   pravastatin (PRAVACHOL) 40 MG tablet TAKE ONE TABLET BY MOUTH EVERYDAY AT BEDTIME (Patient taking differently: Take 40 mg by mouth daily.)   pregabalin (LYRICA) 50 MG capsule Take 1 capsule (50 mg total) by mouth 3 (three) times daily.   Vitamin D, Ergocalciferol, (DRISDOL) 1.25 MG (50000 UNIT) CAPS capsule TAKE ONE CAPSULE BY MOUTH ONCE WEEKLY ON WEDNESDAY   No facility-administered encounter medications on file as of 06/05/2021.    [name], [place] Pharmacist Assistant [phone]

## 2021-06-06 ENCOUNTER — Ambulatory Visit (INDEPENDENT_AMBULATORY_CARE_PROVIDER_SITE_OTHER): Payer: Medicare Other

## 2021-06-06 DIAGNOSIS — E1165 Type 2 diabetes mellitus with hyperglycemia: Secondary | ICD-10-CM

## 2021-06-06 DIAGNOSIS — I10 Essential (primary) hypertension: Secondary | ICD-10-CM

## 2021-06-06 NOTE — Progress Notes (Signed)
Chronic Care Management Pharmacy Note  06/13/2021 Name:  [name] MRN:  [id number] DOB:  1950-12-22  Summary: Patient reports that she has been doing okay   Recommendations/Changes made from today's visit: Recommend patient receive COVID-19 booster vaccine.   Plan: Patient to receive COVID-19 booster vaccine.    Subjective: [name] is an 70 y.o. year old female who is a primary patient of [name], [place].  The CCM team was consulted for assistance with disease management and care coordination needs.    Engaged with patient by telephone for follow up visit in response to provider referral for pharmacy case management and/or care coordination services.   Consent to Services:  The patient was given information about Chronic Care Management services, agreed to services, and gave verbal consent prior to initiation of services.  Please see initial visit note for detailed documentation.   Patient Care Team: [name], FNP as PCP - General (General Practice) Elayne Snare, MD as Consulting Physician (Endocrinology) Bjorn Loser, MD as Consulting Physician (Urology) Juanita Craver, MD as Consulting Physician (Gastroenterology) [name], RN as Case Manager Mayford Knife, [facility] (Pharmacist)  Recent office visits: 05/03/2021 PCP OV  Recent consult visits: None noted   ED visits: 05/30/2021 Yeast Vaginitis   Objective:  Lab Results  Component Value Date   CREATININE 1.19 (H) 04/13/2021   BUN 10 04/13/2021   GFR 51.07 (L) 05/17/2017   GFRNONAA 59 (L) 12/01/2020   GFRAA 57 (L) 07/07/2020   NA 131 (L) 04/13/2021   K 3.9 04/13/2021   CALCIUM 8.7 04/13/2021   CO2 21 04/13/2021   GLUCOSE 499 (H) 04/13/2021    Lab Results  Component Value Date/Time   HGBA1C 11.8 (H) 04/13/2021 03:04 PM   HGBA1C 10.4 (H) 11/29/2020 09:47 PM   FRUCTOSAMINE 388 (H) 05/17/2017 08:54 AM   FRUCTOSAMINE 286 (H) 07/20/2014 10:07 AM   GFR 51.07 (L) 05/17/2017  08:54 AM   GFR 64.19 02/20/2017 11:52 AM   MICROALBUR 80 12/13/2020 05:01 PM   MICROALBUR 80 12/31/2019 03:19 PM    Last diabetic Eye exam: No results found for: HMDIABEYEEXA  Last diabetic Foot exam: No results found for: HMDIABFOOTEX   Lab Results  Component Value Date   CHOL 168 12/31/2019   HDL 54 12/31/2019   LDLCALC 98 12/31/2019   LDLDIRECT 152.8 10/09/2013   TRIG 88 12/31/2019   CHOLHDL 3.1 12/31/2019    Hepatic Function Latest Ref Rng & Units 04/13/2021 07/07/2020 05/03/2020  Total Protein 6.0 - 8.5 g/dL 6.1 7.0 7.1  Albumin 3.8 - 4.8 g/dL 3.6(L) 4.2 4.0  AST 0 - 40 IU/L _0 ALT 0 - 32 IU/L _1 Alk Phosphatase 44 - 121 IU/L 108 107 130(H)  Total Bilirubin 0.0 - 1.2 mg/dL 0.6 0.3 0.7  Bilirubin, Direct 0.00 - 0.40 mg/dL - - -    Lab Results  Component Value Date/Time   TSH 1.990 06/09/2018 12:42 PM   TSH 2.047 05/31/2010 11:11 PM    CBC Latest Ref Rng & Units 04/13/2021 12/13/2020 12/01/2020  WBC 3.4 - 10.8 x10E3/uL 5.9 5.7 4.3  Hemoglobin 11.1 - 15.9 g/dL 13.3 15.1 13.9  Hematocrit 34.0 - 46.6 % 41.3 46.5 42.7  Platelets 150 - 450 x10E3/uL 274 289 238    Lab Results  Component Value Date/Time   VD25OH 34.4 05/03/2020 02:55 PM   VD25OH 41.4 12/31/2019 04:11 PM    Clinical ASCVD: No  The 10-year ASCVD risk  score (Arnett DK, et al., 2019) is: 34.1%   Values used to calculate the score:     Age: 21 years     Sex: Female     Is Non-Hispanic African American: Yes     Diabetic: Yes     Tobacco smoker: No     Systolic Blood Pressure: 428 mmHg     Is BP treated: Yes     HDL Cholesterol: 54 mg/dL     Total Cholesterol: 168 mg/dL    Depression screen [facility] 2/9 01/11/2021 12/31/2019 10/22/2019  Decreased Interest 0 0 0  Down, Depressed, Hopeless 0 0 0  PHQ - 2 Score 0 0 0  Altered sleeping - 0 -  Tired, decreased energy - 0 -  Change in appetite - 0 -  Feeling bad or failure about yourself  - 0 -  Trouble concentrating - 0 -  Moving slowly or  fidgety/restless - 0 -  Suicidal thoughts - 0 -  PHQ-9 Score - 0 -  Difficult doing work/chores - Not difficult at all -  Some recent data might be hidden     Social History   Tobacco Use  Smoking Status Never  Smokeless Tobacco Never   BP Readings from Last 3 Encounters:  05/30/21 (!) 168/97  05/03/21 140/72  04/13/21 134/80   Pulse Readings from Last 3 Encounters:  05/30/21 84  05/03/21 77  04/13/21 98   Wt Readings from Last 3 Encounters:  05/03/21 159 lb 3.2 oz (72.2 kg)  04/13/21 149 lb 6.4 oz (67.8 kg)  02/06/21 146 lb (66.2 kg)   BMI Readings from Last 3 Encounters:  05/03/21 29.12 kg/m  04/13/21 27.50 kg/m  02/06/21 25.06 kg/m    Assessment/Interventions: Review of patient past medical history, allergies, medications, health status, including review of consultants reports, laboratory and other test data, was performed as part of comprehensive evaluation and provision of chronic care management services.   SDOH:  (Social Determinants of Health) assessments and interventions performed: No  SDOH Screenings   Alcohol Screen: Not on file  Depression (PHQ2-9): Low Risk    PHQ-2 Score: 0  Financial Resource Strain: Low Risk    Difficulty of Paying Living Expenses: Not hard at all  Food Insecurity: No Food Insecurity   Worried About Charity fundraiser in the Last Year: Never true   Ran Out of Food in the Last Year: Never true  Housing: Not on file  Physical Activity: Inactive   Days of Exercise per Week: 0 days   Minutes of Exercise per Session: 0 min  Social Connections: Not on file  Stress: No Stress Concern Present   Feeling of Stress : Not at all  Tobacco Use: Low Risk    Smoking Tobacco Use: Never   Smokeless Tobacco Use: Never  Transportation Needs: No Transportation Needs   Lack of Transportation (Medical): No   Lack of Transportation (Non-Medical): No    CCM Care Plan  Allergies  Allergen Reactions   Pollen Extract Other (See Comments)     Congestion/sneezing   Tomato Hives and Itching   Codeine Hives, Itching and Nausea And Vomiting    Medications Reviewed Today     Reviewed by Mayford Knife, RPH (Pharmacist) on 06/06/21 at 1435  Med List Status: <None>   Medication Order Taking? Sig Documenting Provider Last Dose Status Informant  amLODipine (NORVASC) 5 MG tablet [id number] Yes Take 1 tablet (5 mg total) by mouth at bedtime. [name], FNP Taking Active  aspirin EC 81 MG EC tablet [id number] Yes Take 1 tablet (81 mg total) by mouth daily. Swallow whole. Elmarie Shiley, MD Taking Active   blood glucose meter kit and supplies KIT [id number] Yes Dispense based on patient and insurance preference. Check blood sugars 4 times daily E11.69 [name], FNP Taking Active   cephALEXin (KEFLEX) 500 MG capsule [id number] Yes Take 1 capsule (500 mg total) by mouth 2 (two) times daily. Volney American, [place] Taking Active   Continuous Blood Gluc Receiver ([place]) [place] [id number] Yes 1 each by Does not apply route See admin instructions. CGM device-Freestyle Elenor Legato [provider] Taking Active Self  Continuous Blood Gluc Sensor (FREESTYLE LIBRE [place]) [place] [id number] Yes 1 each by Does not apply route See admin instructions. CGM 14-day sensors; send refills to [employer] (in [place]) [provider] Taking Active Self  Finerenone (KERENDIA) 10 MG TABS [id number] Yes Take 1 tablet by mouth daily. [name], FNP Taking Active   fluconazole (DIFLUCAN) 150 MG tablet [id number]  Take 1 tablet (150 mg total) by mouth daily. Volney American, [place]  Active   HUMALOG 100 UNIT/ML injection [id number] Yes Inject 0.1 mLs (10 Units total) into the skin 3 (three) times daily with meals. [name], FNP Taking Active   insulin degludec (TRESIBA FLEXTOUCH) 100 UNIT/ML FlexTouch Pen [id number] Yes Inject 20 Units into the skin daily. [name], FNP Taking Active   Insulin Pen Needle (BD  PEN NEEDLE MICRO U/F) 32G X 6 MM MISC [id number] Yes DX:E11.65 USE TO CHECK BLOOD SUGARS THREE TIMES A Lynnell Dike, MD Taking Active Self  Lancets Misc. [place] [id number] Yes 1 each by Does not apply route 4 (four) times daily -  with meals and at bedtime. [name], FNP Taking Active Self  linaclotide Rolan Lipa) 145 MCG CAPS capsule [id number] Yes Take 1 capsule (145 mcg total) by mouth daily before breakfast. [name], FNP Taking Active   Multiple Vitamin (MULTIVITAMIN WITH MINERALS) TABS tablet [id number] Yes Take 1 tablet by mouth daily. [provider] Taking Active Self  nystatin cream (MYCOSTATIN) [id number] Yes Apply to affected area 2 times daily Volney American, PA-C Taking Active   [facility] VERIO test strip [id number] Yes USE TO test blood sugar FOUR TIMES DAILY [name], FNP Taking Active   pantoprazole (PROTONIX) 40 MG tablet [id number] Yes Take 1 tablet (40 mg total) by mouth daily. [name], FNP Taking Active   pravastatin (PRAVACHOL) 40 MG tablet [id number] Yes TAKE ONE TABLET BY MOUTH EVERYDAY AT BEDTIME  Patient taking differently: Take 40 mg by mouth daily.   [name], FNP Taking Active   pregabalin (LYRICA) 50 MG capsule [id number] Yes Take 1 capsule (50 mg total) by mouth 3 (three) times daily. [name], FNP Taking Active   Vibegron [facility]) 75 MG TABS [id number] Yes Take 75 mg by mouth daily. [provider]  Active Self  Vitamin D, Ergocalciferol, (DRISDOL) 1.25 MG (50000 UNIT) CAPS capsule [id number] Yes TAKE ONE CAPSULE BY MOUTH ONCE WEEKLY ON Marlana Latus [name], FNP Taking Active             Patient Active Problem List   Diagnosis Date Noted   Quadriceps weakness 02/06/2021   Syncope 11/29/2020   Mild aortic stenosis 11/29/2020   Irritable bowel syndrome with diarrhea 10/10/2020   Gastroesophageal reflux disease with esophagitis 10/10/2020   Postlaminectomy syndrome, not elsewhere classified 07/03/2020   S/P  lumbar laminectomy 03/30/2020   Localized  swelling, mass and lump, multiple sites 03/27/2018   Type II diabetes mellitus with renal manifestations ([place]) 12/27/2017   HLD (hyperlipidemia) 12/27/2017   HTN (hypertension) 12/27/2017   CKD (chronic kidney disease), stage III ([place]) 12/27/2017   Cough variant asthma 12/01/2015   Osteoarthritis of left knee, primary localized 08/17/2014   Knee osteoarthritis 08/17/2014   Hypoglycemia 11/15/2013   Other and unspecified hyperlipidemia 08/06/2013   Type II diabetes mellitus, uncontrolled 07/20/2013   Varicose veins of lower extremities with other complications 94/70/9628   Fibromyalgia    GERD 05/18/2010   ABDOMINAL PAIN, GENERALIZED 05/02/2010   OBESITY 04/20/2010   BURSITIS, LEFT SHOULDER 03/09/2010   Lipoma of arm s/p excision 01/29/2014 01/10/2010   DEPRESSION 12/27/2009   BACK PAIN WITH RADICULOPATHY 12/27/2009   CYSTITIS, ACUTE 10/21/2009   MICROSCOPIC HEMATURIA 10/18/2009   KNEE PAIN, BILATERAL 10/18/2009   NEUROPATHY 09/08/2009   Asthma 09/08/2009   STRESS INCONTINENCE 09/08/2009   CHEST PAIN 07/19/2009   ESOPHAGEAL STRICTURE 08/27/2005    Immunization History  Administered Date(s) Administered   Fluad Quad(high Dose 65+) 05/03/2020, 05/03/2021   Influenza Whole 06/27/2009, 06/13/2010   Influenza, High Dose Seasonal PF 07/22/2019   Influenza,inj,Quad PF,6+ Mos 06/03/2017   Influenza-Unspecified 03/27/2013, 03/27/2014, 05/18/2015   PFIZER(Purple Top)SARS-COV-2 Vaccination 09/21/2019, 10/02/2019, 05/24/2020   Pneumococcal Conjugate-13 07/24/2017   Pneumococcal Polysaccharide-23 08/27/2006, 11/29/2009, 03/24/2019   Td 10/18/2009   Tdap 12/27/2017    Conditions to be addressed/monitored:  Hypertension and Diabetes  Care Plan : [place]  Updates made by Mayford Knife, [place] since 06/13/2021 12:00 AM     Problem: HTN, HLD, DM II   Priority: High     Long-Range Goal: Disease Management   Recent Progress:  On track  Note:    Current Barriers:  Unable to independently monitor therapeutic efficacy Unable to achieve control of diabetes   Pharmacist Clinical Goal(s):  Patient will achieve adherence to monitoring guidelines and medication adherence to achieve therapeutic efficacy achieve control of diabetes as evidenced by A1C and blood sugar readings through collaboration with PharmD and provider.   Interventions: 1:1 collaboration with [name], FNP regarding development and update of comprehensive plan of care as evidenced by provider attestation and co-signature Inter-disciplinary care team collaboration (see longitudinal plan of care) Comprehensive medication review performed; medication list updated in electronic medical record  Hypertension (BP goal <130/80) -Controlled -Current treatment: Amlodipine 5 mg tablet once per day  -Current home readings: 130/80, 130/78, 127/82, 123/79, 131/78, 134/78 -Current dietary habits: she is not using any salt products she is using salt substitutes.  -Current exercise habits: patient reports t -Denies hypotensive/hypertensive symptoms -Educated on BP goals and benefits of medications for prevention of heart attack, stroke and kidney damage; Daily salt intake goal < 2300 mg; Importance of home blood pressure monitoring; -Counseled to monitor BP at home at least every other day, document, and provide log at future appointments -Counseled on diet and exercise extensively Recommended to continue current medication  Diabetes (A1c goal <7%) -Uncontrolled -Current medications: [name]- inject 20 units into the skin daily  Humalog - inject 10 units three times per daily.  -Medications previously tried: [place]  -Current home glucose readings Patient is currently using continuous glucose monitoring system  -Denies hypoglycemic/hyperglycemic symptoms -Current meal patterns: two meals per day  breakfast: not necessarily eating   lunch: soup, or  sandwich or salad   dinner: vegetables, or fish with okra and mashed potatoes  snacks: she is not eating a  lot of snacks  drinks: she increased water to 40-48 ounces per day, she is increasing the amount of water,. She is only drinking one cup of sweet tea per day -Current exercise: she has an elliptical that she uses and she also does some walking, recently she has not been doing any exercise.  -Educated on A1c and blood sugar goals; Exercise goal of 150 minutes per week; -Counseled to check feet daily and get yearly eye exams -Patient reports they have no availability at the endocrinologist so they sid they would notfiy her is someone cancels or cant make it.  -Recommend patient be started on additional agent, possibly Ozempic due to increase A1C -Recommended to continue current medication  Patient Goals/Self-Care Activities Patient will:  - take medications as prescribed  Follow Up Plan: The patient has been provided with contact information for the care management team and has been advised to call with any health related questions or concerns.       Medication Assistance: None required.  Patient affirms current coverage meets needs.  Compliance/Adherence/Medication fill history: Care Gaps: Ophthalmology Exam Shingrix Vaccine Foot Exam COVID-19 Booster  Star-Rating Drugs: Pravastatin 40 mg tablet   Patient's preferred pharmacy is:  [facility] - [place], [place] - [address] [place] [address] Phone: [phone] Fax: [phone]  [facility] [phone] - [place], [address] AT [address] [place] [address] Phone: [phone] Fax: [phone]  Uses pill box? Yes Pt endorses 95% compliance  We discussed: Benefits of medication synchronization, packaging and delivery as well as enhanced pharmacist oversight with [facility]. Patient  decided to: Utilize [facility] pharmacy for medication synchronization, packaging and delivery  Care Plan and Follow Up Patient Decision:  Patient agrees to Care Plan and Follow-up.  Plan: The patient has been provided with contact information for the care management team and has been advised to call with any health related questions or concerns.   [name], PharmD Clinical Pharmacist [facility] [phone]

## 2021-06-07 ENCOUNTER — Encounter: Payer: Self-pay | Admitting: Nurse Practitioner

## 2021-06-13 NOTE — Patient Instructions (Signed)
Visit Information It was great speaking with you today!  Please let me know if you have any questions about our visit.   Goals Addressed             This Visit's Progress    Manage My Medicine       Timeframe:  Long-Range Goal Priority:  High Start Date:                             Expected End Date:                       Follow Up Date 07/11/2021  In Progress:    - call for medicine refill 2 or 3 days before it runs out - call if I am sick and can't take my medicine - keep a list of all the medicines I take; vitamins and herbals too - learn to read medicine labels - use a pillbox to sort medicine - use an alarm clock or phone to remind me to take my medicine    Why is this important?   These steps will help you keep on track with your medicines. Please call if you have any questions about your medications.            Patient Care Plan: CCM Pharmacy Care Plan     Problem Identified: HTN, HLD, DM II   Priority: High     Long-Range Goal: Disease Management   Recent Progress: On track  Note:    Current Barriers:  Unable to independently monitor therapeutic efficacy Unable to achieve control of diabetes   Pharmacist Clinical Goal(s):  Patient will achieve adherence to monitoring guidelines and medication adherence to achieve therapeutic efficacy achieve control of diabetes as evidenced by A1C and blood sugar readings through collaboration with PharmD and provider.   Interventions: 1:1 collaboration with [name], FNP regarding development and update of comprehensive plan of care as evidenced by provider attestation and co-signature Inter-disciplinary care team collaboration (see longitudinal plan of care) Comprehensive medication review performed; medication list updated in electronic medical record  Hypertension (BP goal <130/80) -Controlled -Current treatment: Amlodipine 5 mg tablet once per day  -Current home readings: 130/80, 130/78, 127/82,  123/79, 131/78, 134/78 -Current dietary habits: she is not using any salt products she is using salt substitutes.  -Current exercise habits: patient reports t -Denies hypotensive/hypertensive symptoms -Educated on BP goals and benefits of medications for prevention of heart attack, stroke and kidney damage; Daily salt intake goal < 2300 mg; Importance of home blood pressure monitoring; -Counseled to monitor BP at home at least every other day, document, and provide log at future appointments -Counseled on diet and exercise extensively Recommended to continue current medication  Diabetes (A1c goal <7%) -Uncontrolled -Current medications: [name]- inject 20 units into the skin daily  Humalog - inject 10 units three times per daily.  -Medications previously tried: [place]  -Current home glucose readings Patient is currently using continuous glucose monitoring system  -Denies hypoglycemic/hyperglycemic symptoms -Current meal patterns: two meals per day  breakfast: not necessarily eating   lunch: soup, or sandwich or salad   dinner: vegetables, or fish with okra and mashed potatoes  snacks: she is not eating a lot of snacks  drinks: she increased water to 40-48 ounces per day, she is increasing the amount of water,. She is only drinking one cup of sweet tea per day -Current exercise: she has  an elliptical that she uses and she also does some walking, recently she has not been doing any exercise.  -Educated on A1c and blood sugar goals; Exercise goal of 150 minutes per week; -Counseled to check feet daily and get yearly eye exams -Patient reports they have no availability at the endocrinologist so they sid they would notfiy her is someone cancels or cant make it.  -Recommend patient be started on additional agent, possibly Ozempic due to increase A1C -Recommended to continue current medication  Patient Goals/Self-Care Activities Patient will:  - take medications as prescribed  Follow  Up Plan: The patient has been provided with contact information for the care management team and has been advised to call with any health related questions or concerns.        Patient agreed to services and verbal consent obtained.   The patient verbalized understanding of instructions, educational materials, and care plan provided today and agreed to receive a mailed copy of patient instructions, educational materials, and care plan.   [name], PharmD Clinical Pharmacist [facility] [phone]

## 2021-06-16 ENCOUNTER — Encounter: Payer: Self-pay | Admitting: Nurse Practitioner

## 2021-06-20 ENCOUNTER — Ambulatory Visit (INDEPENDENT_AMBULATORY_CARE_PROVIDER_SITE_OTHER): Payer: Medicare Other | Admitting: Nurse Practitioner

## 2021-06-20 ENCOUNTER — Telehealth: Payer: Self-pay

## 2021-06-20 ENCOUNTER — Other Ambulatory Visit: Payer: Self-pay

## 2021-06-20 ENCOUNTER — Encounter: Payer: Self-pay | Admitting: Nurse Practitioner

## 2021-06-20 VITALS — BP 130/82 | HR 81 | Temp 98.0°F | Ht 62.0 in | Wt 166.4 lb

## 2021-06-20 DIAGNOSIS — E782 Mixed hyperlipidemia: Secondary | ICD-10-CM

## 2021-06-20 DIAGNOSIS — Z794 Long term (current) use of insulin: Secondary | ICD-10-CM | POA: Diagnosis not present

## 2021-06-20 DIAGNOSIS — E1169 Type 2 diabetes mellitus with other specified complication: Secondary | ICD-10-CM

## 2021-06-20 DIAGNOSIS — R2242 Localized swelling, mass and lump, left lower limb: Secondary | ICD-10-CM | POA: Diagnosis not present

## 2021-06-20 DIAGNOSIS — Z79899 Other long term (current) drug therapy: Secondary | ICD-10-CM | POA: Diagnosis not present

## 2021-06-20 DIAGNOSIS — I10 Essential (primary) hypertension: Secondary | ICD-10-CM

## 2021-06-20 DIAGNOSIS — E1165 Type 2 diabetes mellitus with hyperglycemia: Secondary | ICD-10-CM

## 2021-06-20 DIAGNOSIS — M79672 Pain in left foot: Secondary | ICD-10-CM | POA: Diagnosis not present

## 2021-06-20 MED ORDER — TRESIBA FLEXTOUCH 100 UNIT/ML ~~LOC~~ SOPN: 25.0000 [IU] | PEN_INJECTOR | Freq: Every day | SUBCUTANEOUS | 1 refills | Status: DC

## 2021-06-20 NOTE — Chronic Care Management (AMB) (Signed)
    Chronic Care Management Pharmacy Assistant   Name: CHELISE HANGER  MRN: [id number] DOB: 1951/02/20  06/20/2021- The [employer] pharmacogenomics process and benefits of testing was discussed with the patient. After the discussion, the patient made an informed consent to testing and the sample was collected and mailed to the external lab.  [name], [place] Pharmacist Assistant [phone]

## 2021-06-20 NOTE — Progress Notes (Signed)
I, Azalee Course, acting as a Education administrator for [employer], FNP.,have documented all relevant documentation on the behalf of [name], FNP,as directed by  [name], FNP while in the presence of [name], [place].   This visit occurred during the SARS-CoV-2 public health emergency.  Safety protocols were in place, including screening questions prior to the visit, additional usage of staff PPE, and extensive cleaning of exam room while observing appropriate contact time as indicated for disinfecting solutions.  Subjective:     Patient ID: [name] , female    DOB: Nov 28, 1950 , 70 y.o.   MRN: [id number]   Chief Complaint  Patient presents with   Diabetes    HPI  Pt here for DM f/u. She has an apt with [facility] next month.  Diabetes She presents for her follow-up diabetic visit. She has type 2 diabetes mellitus. There are no diabetic associated symptoms.    Past Medical History:  Diagnosis Date   Aortic stenosis    mild on 04/07/19 echo   Arthritis    L knee, hands, back    Asthma    Diabetes mellitus type 2, insulin dependent (HCC)    Fibromyalgia    Full dentures    Full dentures    GERD (gastroesophageal reflux disease)    H/O hiatal hernia    Headache    h/o migraines - was followed for a time with a wellness doctor   History of esophageal dilatation    History of rhabdomyolysis    03/ 2015   Hyperlipidemia    Hypertension    Insulin pump in place    since 12/ 2014--  [facility]   Lumbar disc herniation    right leg weakness   Osteoarthritis of left knee, primary localized 08/17/2014   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   Renal insufficiency    Rhabdomyolysis 11/15/2013   SUI (stress urinary incontinence, female)    Syncope 12/27/2017   Wears glasses    Wears glasses      Family History  Problem Relation Age of Onset   Diabetes Mother    Hypertension Mother    Lung cancer Mother        smoked   Diabetes Father    Hypertension Father    Lung cancer  Father        smoked   Diabetes Brother    Heart failure Sister    Diabetes Maternal Grandmother    Cancer Maternal Grandmother      Current Outpatient Medications:    amLODipine (NORVASC) 5 MG tablet, Take 1 tablet (5 mg total) by mouth at bedtime., Disp: 90 tablet, Rfl: 1   aspirin EC 81 MG EC tablet, Take 1 tablet (81 mg total) by mouth daily. Swallow whole., Disp: 30 tablet, Rfl: 11   blood glucose meter kit and supplies KIT, Dispense based on patient and insurance preference. Check blood sugars 4 times daily E11.69, Disp: 1 each, Rfl: 1   cephALEXin (KEFLEX) 500 MG capsule, Take 1 capsule (500 mg total) by mouth 2 (two) times daily., Disp: 10 capsule, Rfl: 0   Continuous Blood Gluc Receiver (FREESTYLE LIBRE READER) DEVI, 1 each by Does not apply route See admin instructions. CGM device-Freestyle Libre, Disp: , Rfl:    Continuous Blood Gluc Sensor (FREESTYLE LIBRE 14 DAY SENSOR) MISC, 1 each by Does not apply route See admin instructions. CGM 14-day sensors; send refills to [facility] (in [place]), Disp: , Rfl:    Finerenone (KERENDIA) 10 MG TABS, Take  1 tablet by mouth daily., Disp: 30 tablet, Rfl: 2   HUMALOG 100 UNIT/ML injection, Inject 0.1 mLs (10 Units total) into the skin 3 (three) times daily with meals., Disp: 10 mL, Rfl: 3   Insulin Pen Needle (BD PEN NEEDLE MICRO U/F) 32G X 6 MM MISC, DX:E11.65 USE TO CHECK BLOOD SUGARS THREE TIMES A DAY, Disp: 100 each, Rfl: 6   Lancets Misc. MISC, 1 each by Does not apply route 4 (four) times daily -  with meals and at bedtime., Disp: 300 each, Rfl: 3   linaclotide (LINZESS) 145 MCG CAPS capsule, Take 1 capsule (145 mcg total) by mouth daily before breakfast., Disp: 90 capsule, Rfl: 1   Multiple Vitamin (MULTIVITAMIN WITH MINERALS) TABS tablet, Take 1 tablet by mouth daily., Disp: , Rfl:    nystatin cream (MYCOSTATIN), Apply to affected area 2 times daily, Disp: 80 g, Rfl: 2   ONETOUCH VERIO test strip, USE TO test blood sugar FOUR TIMES  DAILY, Disp: 100 strip, Rfl: 1   pantoprazole (PROTONIX) 40 MG tablet, Take 1 tablet (40 mg total) by mouth daily., Disp: 90 tablet, Rfl: 1   pravastatin (PRAVACHOL) 40 MG tablet, TAKE ONE TABLET BY MOUTH EVERYDAY AT BEDTIME (Patient taking differently: Take 40 mg by mouth daily.), Disp: 90 tablet, Rfl: 2   pregabalin (LYRICA) 50 MG capsule, Take 1 capsule (50 mg total) by mouth 3 (three) times daily., Disp: 90 capsule, Rfl: 2   Vibegron (GEMTESA) 75 MG TABS, Take 75 mg by mouth daily., Disp: , Rfl:    Vitamin D, Ergocalciferol, (DRISDOL) 1.25 MG (50000 UNIT) CAPS capsule, TAKE ONE CAPSULE BY MOUTH ONCE WEEKLY ON WEDNESDAY, Disp: 13 capsule, Rfl: 1   fluconazole (DIFLUCAN) 150 MG tablet, Take 1 tablet (150 mg total) by mouth daily. (Patient not taking: Reported on 06/20/2021), Disp: 14 tablet, Rfl: 0   insulin degludec (TRESIBA FLEXTOUCH) 100 UNIT/ML FlexTouch Pen, Inject 25 Units into the skin daily., Disp: 9 mL, Rfl: 1   Allergies  Allergen Reactions   Pollen Extract Other (See Comments)    Congestion/sneezing   Tomato Hives and Itching   Codeine Hives, Itching and Nausea And Vomiting     Review of Systems  Constitutional: Negative.   Respiratory: Negative.    Cardiovascular: Negative.   Neurological: Negative.   Psychiatric/Behavioral: Negative.      Today's Vitals   06/20/21 1000  BP: 130/82  Pulse: 81  Temp: 98 F (36.7 C)  Weight: 166 lb 6.4 oz (75.5 kg)  Height: 5' 2" (1.575 m)  PainSc: 0-No pain   Body mass index is 30.43 kg/m.  Wt Readings from Last 3 Encounters:  06/20/21 166 lb 6.4 oz (75.5 kg)  05/03/21 159 lb 3.2 oz (72.2 kg)  04/13/21 149 lb 6.4 oz (67.8 kg)    Objective:  Physical Exam Vitals reviewed.  Constitutional:      General: She is not in acute distress.    Appearance: Normal appearance.  Cardiovascular:     Rate and Rhythm: Normal rate and regular rhythm.     Pulses: Normal pulses.     Heart sounds: Normal heart sounds. No murmur  heard. Pulmonary:     Effort: Pulmonary effort is normal. No respiratory distress.     Breath sounds: Normal breath sounds. No wheezing.  Musculoskeletal:        General: Tenderness (left lower extremity with trace edema) present.  Neurological:     General: No focal deficit present.     Mental   Status: She is alert and oriented to person, place, and time.     Cranial Nerves: No cranial nerve deficit.     Motor: No weakness.  Psychiatric:        Mood and Affect: Mood normal.        Behavior: Behavior normal.        Thought Content: Thought content normal.        Judgment: Judgment normal.        Assessment And Plan:     1. Uncontrolled type 2 diabetes mellitus with hyperglycemia ([place]) Comments: Blood sugars are slowly improving, will increase Tresiba to 25 units nightly. She is still awaiting an appt with Dr. Kelton Pillar. Diabetic foot exam done - insulin degludec (TRESIBA FLEXTOUCH) 100 UNIT/ML FlexTouch Pen; Inject 25 Units into the skin daily.  Dispense: 9 mL; Refill: 1 - Ambulatory referral to Podiatry - Hemoglobin A1c - [place]  2. Essential hypertension Comments: Controlled, no changes - [facility]  3. Mixed hyperlipidemia Comments: Stable, no changes  4. Encounter for long-term (current) use of insulin ([place])  5. Localized swelling of left foot Comments: Trace swelling, will check uric acid - Uric acid  6. Polypharmacy - [facility]     Patient was given opportunity to ask questions. Patient verbalized understanding of the plan and was able to repeat key elements of the plan. All questions were answered to their satisfaction.  [name], FNP   I, [name], FNP, have reviewed all documentation for this visit. The documentation on 06/20/21 for the exam, diagnosis, procedures, and orders are all accurate and complete.   IF YOU HAVE BEEN REFERRED TO A SPECIALIST, IT MAY TAKE 1-2  WEEKS TO SCHEDULE/PROCESS THE REFERRAL. IF YOU HAVE NOT HEARD FROM US/SPECIALIST IN TWO WEEKS, PLEASE GIVE [place] A CALL AT [phone].   THE PATIENT IS ENCOURAGED TO PRACTICE SOCIAL DISTANCING DUE TO THE COVID-19 PANDEMIC.

## 2021-06-20 NOTE — Patient Instructions (Signed)
Diabetes Mellitus Action Plan °Following a diabetes action plan is a way for you to manage your diabetes (diabetes mellitus) symptoms. The plan is color-coded to help you understand what actions you need to take based on any symptoms you are having. °If you have symptoms in the red zone, you need medical care right away. °If you have symptoms in the yellow zone, you are having problems. °If you have symptoms in the green zone, you are doing well. °Learning about and understanding diabetes can take time. Follow the plan that you develop with your health care provider. Know the target range for your blood sugar (glucose) level, and review your treatment plan with your health care provider at each visit. °The target range for my blood sugar level is __________________________ mg/dL. °Red zone °Get medical help right away if you have any of the following symptoms: °A blood sugar test result that is below 54 mg/dL (3 mmol/L). °A blood sugar test result that is at or above 240 mg/dL (13.3 mmol/L) for 2 days in a row. °Confusion or trouble thinking clearly. °Difficulty breathing. °Sickness or a fever for 2 or more days that is not getting better. °Moderate or large ketone levels in your urine. °Feeling tired or having no energy. °If you have any red zone symptoms, do not wait to see if the symptoms will go away. Get medical help right away. Call your local emergency services (911 in the [place] Do not drive yourself to the hospital. °If you have severely low blood sugar (severe hypoglycemia) and you cannot eat or drink, you may need glucagon. Make sure a family member or close friend knows how to check your blood sugar and how to give you glucagon. You may need to be treated in a hospital for this condition. °Yellow zone °If you have any of the following symptoms, your diabetes is not under control and you may need to make some changes: °A blood sugar test result that is at or above 240 mg/dL (13.3 mmol/L) for 2 days in a  row. °Blood sugar test results that are below 70 mg/dL (3.9 mmol/L). °Other symptoms of hypoglycemia, such as: °Shaking or feeling light-headed. °Confusion or irritability. °Feeling hungry. °Having a fast heartbeat. °If you have any yellow zone symptoms: °Treat your hypoglycemia by eating or drinking 15 grams of a rapid-acting carbohydrate. Follow the 15:15 rule: °Take 15 grams of a rapid-acting carbohydrate, such as: °1 tube of glucose gel. °4 glucose pills. °4 oz (120 mL) of fruit juice. °4 oz (120 mL) of regular (not diet) soda. °Check your blood sugar 15 minutes after you take the carbohydrate. °If the repeat blood sugar test is still at or below 70 mg/dL (3.9 mmol/L), take 15 grams of a carbohydrate again. °If your blood sugar does not increase above 70 mg/dL (3.9 mmol/L) after 3 tries, get medical help right away. °After your blood sugar returns to normal, eat a meal or a snack within 1 hour. °Keep taking your daily medicines as told by your health care provider. °Check your blood sugar more often than you normally would. °Write down your results. °Call your health care provider if you have trouble keeping your blood sugar in your target range. ° °Green zone °These signs mean you are doing well and you can continue what you are doing to manage your diabetes: °Your blood sugar is within your personal target range. For most people, a blood sugar level before a meal (preprandial) should be 80-130 mg/dL (4.4-7.2 mmol/L). °You feel   well, and you are able to do daily activities. °If you are in the green zone, continue to manage your diabetes as told by your health care provider. To do this: °Eat a healthy diet. °Exercise regularly. °Check your blood sugar as told by your health care provider. °Take your medicines as told by your health care provider. ° °Where to find more information °American Diabetes Association (ADA): diabetes.org °Association of Diabetes Care & Education Specialists (ADCES):  diabeteseducator.org °Summary °Following a diabetes action plan is a way for you to manage your diabetes symptoms. The plan is color-coded to help you understand what actions you need to take based on any symptoms you are having. °Follow the plan that you develop with your health care provider. Make sure you know your personal target blood sugar level. °Review your treatment plan with your health care provider at each visit. °This information is not intended to replace advice given to you by your health care provider. Make sure you discuss any questions you have with your health care provider. °Document Revised: 02/18/2020 Document Reviewed: 02/18/2020 °Elsevier Patient Education © 2022 Elsevier Inc. ° °

## 2021-06-21 LAB — URIC ACID: Uric Acid: 5 mg/dL (ref 3.0–7.2)

## 2021-06-21 LAB — HEMOGLOBIN A1C
Est. average glucose Bld gHb Est-mCnc: 275 mg/dL
Hgb A1c MFr Bld: 11.2 % — ABNORMAL HIGH (ref 4.8–5.6)

## 2021-06-22 ENCOUNTER — Telehealth: Payer: Medicare Other

## 2021-06-22 ENCOUNTER — Telehealth: Payer: Self-pay

## 2021-06-22 NOTE — Chronic Care Management (AMB) (Signed)
Chronic Care Management Pharmacy Assistant   Name: [name]  MRN: [id number] DOB: 08-04-1951   Reason for Encounter: Medication Review/ Medication coordination  Recent office visits:  06-20-2021 [name], [place]. INCREASE tresiba 20 units TO 25 units. A1C= 11.2  Recent consult visits:  None  Hospital visits:  None in previous 6 months  Medications: Outpatient Encounter Medications as of 06/22/2021  Medication Sig   amLODipine (NORVASC) 5 MG tablet Take 1 tablet (5 mg total) by mouth at bedtime.   aspirin EC 81 MG EC tablet Take 1 tablet (81 mg total) by mouth daily. Swallow whole.   blood glucose meter kit and supplies KIT Dispense based on patient and insurance preference. Check blood sugars 4 times daily E11.69   cephALEXin (KEFLEX) 500 MG capsule Take 1 capsule (500 mg total) by mouth 2 (two) times daily.   Continuous Blood Gluc Receiver (FREESTYLE LIBRE READER) DEVI 1 each by Does not apply route See admin instructions. CGM device-Freestyle Libre   Continuous Blood Gluc Sensor (FREESTYLE LIBRE 14 DAY SENSOR) MISC 1 each by Does not apply route See admin instructions. CGM 14-day sensors; send refills to [facility] (in [facility])   Finerenone (KERENDIA) 10 MG TABS Take 1 tablet by mouth daily.   fluconazole (DIFLUCAN) 150 MG tablet Take 1 tablet (150 mg total) by mouth daily. (Patient not taking: Reported on 06/20/2021)   HUMALOG 100 UNIT/ML injection Inject 0.1 mLs (10 Units total) into the skin 3 (three) times daily with meals.   insulin degludec (TRESIBA FLEXTOUCH) 100 UNIT/ML FlexTouch Pen Inject 25 Units into the skin daily.   Insulin Pen Needle (BD PEN NEEDLE MICRO U/F) 32G X 6 MM MISC DX:E11.65 USE TO CHECK BLOOD SUGARS THREE TIMES A DAY   Lancets Misc. MISC 1 each by Does not apply route 4 (four) times daily -  with meals and at bedtime.   linaclotide (LINZESS) 145 MCG CAPS capsule Take 1 capsule (145 mcg total) by mouth daily before breakfast.   Multiple  Vitamin (MULTIVITAMIN WITH MINERALS) TABS tablet Take 1 tablet by mouth daily.   nystatin cream (MYCOSTATIN) Apply to affected area 2 times daily   ONETOUCH VERIO test strip USE TO test blood sugar FOUR TIMES DAILY   pantoprazole (PROTONIX) 40 MG tablet Take 1 tablet (40 mg total) by mouth daily.   pravastatin (PRAVACHOL) 40 MG tablet TAKE ONE TABLET BY MOUTH EVERYDAY AT BEDTIME (Patient taking differently: Take 40 mg by mouth daily.)   pregabalin (LYRICA) 50 MG capsule Take 1 capsule (50 mg total) by mouth 3 (three) times daily.   Vibegron (GEMTESA) 75 MG TABS Take 75 mg by mouth daily.   Vitamin D, Ergocalciferol, (DRISDOL) 1.25 MG (50000 UNIT) CAPS capsule TAKE ONE CAPSULE BY MOUTH ONCE WEEKLY ON WEDNESDAY   No facility-administered encounter medications on file as of 06/22/2021.   Reviewed chart for medication changes ahead of medication coordination call.  No OVs, Consults, or hospital visits since last care coordination call/Pharmacist visit. (If appropriate, list visit date, provider name)  No medication changes indicated OR if recent visit, treatment plan here.  BP Readings from Last 3 Encounters:  06/20/21 130/82  05/30/21 (!) 168/97  05/03/21 140/72    Lab Results  Component Value Date   HGBA1C 11.2 (H) 06/20/2021     Patient obtains medications through Adherence Packaging  30 Days   Last adherence delivery included:  Pravastatin 40 mg- 1 tablet daily (bedtime) Amlodipine 5 mg- 1 tablet daily (bedtime)  Pantoprazole 40 mg- 1 tablet daily (breakfast) Linzess 145 mcg- 1 tablet daily(before breakfast) Women's 50 Plus Multivitamin- 1 tablet daily (breakfast) Ergocalciferol 1250 mcg- 1 capsule weekly on Wednesdays (breakfast)   Patient declined (meds) last month: None  Patient is due for next adherence delivery on: 07-04-2021 Called patient and reviewed medications and coordinated delivery.  This delivery to include: Pravastatin 40 mg- 1 tablet daily  (bedtime) Amlodipine 5 mg- 1 tablet daily (bedtime) Pantoprazole 40 mg- 1 tablet daily (breakfast) Linzess 145 mcg- 1 tablet daily(before breakfast) Women's 50 Plus Multivitamin- 1 tablet daily (breakfast) Ergocalciferol 1250 mcg- 1 capsule weekly on Wednesdays (breakfast) Kerendia 10 mg daily at breakfast Tresiba 25 units daily  No short/ acute fills needed  Patient declined the following medications: None  Patient needs refills for: Pravastatin Pantoprazole Linzess Vitamin D2  Confirmed delivery date of 07-04-2021 advised patient that pharmacy will contact them the morning of delivery.   Care Gaps: Yearly ophthalmology exam overdue Shingrix overdue Covid booster overdue last completed 05-24-2020 A1C 11.1 06-20-2021   Star Rating Drugs: Pravastatin 40 mg- Last filled 05-30-2021 30 [facility]  [place] Clinical Pharmacist Assistant [phone]

## 2021-06-26 ENCOUNTER — Other Ambulatory Visit: Payer: Self-pay

## 2021-06-26 ENCOUNTER — Telehealth: Payer: Self-pay

## 2021-06-26 DIAGNOSIS — E1165 Type 2 diabetes mellitus with hyperglycemia: Secondary | ICD-10-CM | POA: Diagnosis not present

## 2021-06-26 DIAGNOSIS — I10 Essential (primary) hypertension: Secondary | ICD-10-CM | POA: Diagnosis not present

## 2021-06-26 MED ORDER — PANTOPRAZOLE SODIUM 40 MG PO TBEC
40.0000 mg | DELAYED_RELEASE_TABLET | Freq: Every day | ORAL | 1 refills | Status: DC
Start: 2021-06-26 — End: 2021-12-25

## 2021-06-26 MED ORDER — VITAMIN D (ERGOCALCIFEROL) 1.25 MG (50000 UNIT) PO CAPS: ORAL_CAPSULE | ORAL | 1 refills | Status: DC

## 2021-06-26 MED ORDER — LINACLOTIDE 145 MCG PO CAPS: 145.0000 ug | ORAL_CAPSULE | Freq: Every day | ORAL | 1 refills | Status: DC

## 2021-06-26 NOTE — Chronic Care Management (AMB) (Signed)
    Chronic Care Management Pharmacy Assistant   Name: [name]  MRN: [id number] DOB: [birth year]  Reason for Encounter: Adherence report review    Medications: Outpatient Encounter Medications as of 06/26/2021  Medication Sig   amLODipine (NORVASC) 5 MG tablet Take 1 tablet (5 mg total) by mouth at bedtime.   aspirin EC 81 MG EC tablet Take 1 tablet (81 mg total) by mouth daily. Swallow whole.   blood glucose meter kit and supplies KIT Dispense based on patient and insurance preference. Check blood sugars 4 times daily E11.69   cephALEXin (KEFLEX) 500 MG capsule Take 1 capsule (500 mg total) by mouth 2 (two) times daily.   Continuous Blood Gluc Receiver (FREESTYLE LIBRE READER) DEVI 1 each by Does not apply route See admin instructions. CGM device-Freestyle Libre   Continuous Blood Gluc Sensor (FREESTYLE LIBRE 14 DAY SENSOR) MISC 1 each by Does not apply route See admin instructions. CGM 14-day sensors; send refills to [facility] (in [facility])   Finerenone (KERENDIA) 10 MG TABS Take 1 tablet by mouth daily.   fluconazole (DIFLUCAN) 150 MG tablet Take 1 tablet (150 mg total) by mouth daily. (Patient not taking: Reported on 06/20/2021)   HUMALOG 100 UNIT/ML injection Inject 0.1 mLs (10 Units total) into the skin 3 (three) times daily with meals.   insulin degludec (TRESIBA FLEXTOUCH) 100 UNIT/ML FlexTouch Pen Inject 25 Units into the skin daily.   Insulin Pen Needle (BD PEN NEEDLE MICRO U/F) 32G X 6 MM MISC DX:E11.65 USE TO CHECK BLOOD SUGARS THREE TIMES A DAY   Lancets Misc. MISC 1 each by Does not apply route 4 (four) times daily -  with meals and at bedtime.   linaclotide (LINZESS) 145 MCG CAPS capsule Take 1 capsule (145 mcg total) by mouth daily before breakfast.   Multiple Vitamin (MULTIVITAMIN WITH MINERALS) TABS tablet Take 1 tablet by mouth daily.   nystatin cream (MYCOSTATIN) Apply to affected area 2 times daily   ONETOUCH VERIO test strip USE TO test blood sugar FOUR  TIMES DAILY   pantoprazole (PROTONIX) 40 MG tablet Take 1 tablet (40 mg total) by mouth daily.   pravastatin (PRAVACHOL) 40 MG tablet TAKE ONE TABLET BY MOUTH EVERYDAY AT BEDTIME (Patient taking differently: Take 40 mg by mouth daily.)   pregabalin (LYRICA) 50 MG capsule Take 1 capsule (50 mg total) by mouth 3 (three) times daily.   Vibegron (GEMTESA) 75 MG TABS Take 75 mg by mouth daily.   Vitamin D, Ergocalciferol, (DRISDOL) 1.25 MG (50000 UNIT) CAPS capsule TAKE ONE CAPSULE BY MOUTH ONCE WEEKLY ON WEDNESDAY   No facility-administered encounter medications on file as of 06/26/2021.   06-26-2021: AWV 01-11-21 next 01-24-22, office visit 06-20-21 BP 130/82 A1C 11.2  [place] Clinical Pharmacist Assistant [phone]

## 2021-06-27 ENCOUNTER — Other Ambulatory Visit: Payer: Self-pay | Admitting: Nurse Practitioner

## 2021-06-27 DIAGNOSIS — N3020 Other chronic cystitis without hematuria: Secondary | ICD-10-CM | POA: Diagnosis not present

## 2021-07-02 ENCOUNTER — Encounter: Payer: Self-pay | Admitting: Nurse Practitioner

## 2021-07-03 ENCOUNTER — Ambulatory Visit (INDEPENDENT_AMBULATORY_CARE_PROVIDER_SITE_OTHER): Payer: Medicare Other | Admitting: Podiatry

## 2021-07-03 ENCOUNTER — Ambulatory Visit (INDEPENDENT_AMBULATORY_CARE_PROVIDER_SITE_OTHER): Payer: Medicare Other

## 2021-07-03 ENCOUNTER — Other Ambulatory Visit: Payer: Self-pay

## 2021-07-03 ENCOUNTER — Encounter: Payer: Self-pay | Admitting: Podiatry

## 2021-07-03 DIAGNOSIS — R1310 Dysphagia, unspecified: Secondary | ICD-10-CM | POA: Insufficient documentation

## 2021-07-03 DIAGNOSIS — R143 Flatulence: Secondary | ICD-10-CM | POA: Insufficient documentation

## 2021-07-03 DIAGNOSIS — M792 Neuralgia and neuritis, unspecified: Secondary | ICD-10-CM

## 2021-07-03 DIAGNOSIS — M778 Other enthesopathies, not elsewhere classified: Secondary | ICD-10-CM

## 2021-07-03 DIAGNOSIS — R140 Abdominal distension (gaseous): Secondary | ICD-10-CM | POA: Insufficient documentation

## 2021-07-03 DIAGNOSIS — K5730 Diverticulosis of large intestine without perforation or abscess without bleeding: Secondary | ICD-10-CM | POA: Insufficient documentation

## 2021-07-03 DIAGNOSIS — K5904 Chronic idiopathic constipation: Secondary | ICD-10-CM | POA: Insufficient documentation

## 2021-07-03 DIAGNOSIS — Z1211 Encounter for screening for malignant neoplasm of colon: Secondary | ICD-10-CM | POA: Insufficient documentation

## 2021-07-03 DIAGNOSIS — K449 Diaphragmatic hernia without obstruction or gangrene: Secondary | ICD-10-CM | POA: Insufficient documentation

## 2021-07-03 DIAGNOSIS — R141 Gas pain: Secondary | ICD-10-CM | POA: Insufficient documentation

## 2021-07-03 MED ORDER — METHYLPREDNISOLONE 4 MG PO TBPK: ORAL_TABLET | ORAL | 0 refills | Status: DC

## 2021-07-03 NOTE — Progress Notes (Signed)
  Subjective:  Patient ID: [name], female    DOB: 07-Jul-1951,   MRN: [id number]  Chief Complaint  Patient presents with   Nail Problem    My toenails hurt but I cut them my self   Foot Pain    The left foot hurts on top and on the bottom and there is not any injury    70 y.o. female presents for pain on the top and bottom of her left foot that has been present for more than 6 months. Denies any injury. Relates swelling and cold pain as well as hot.  Has tried topicals and elevation with some relief. She is a diabetic and her last A1c was 10.  [name] Denies any other pedal complaints. Denies n/v/f/c.   Past Medical History:  Diagnosis Date   Aortic stenosis    mild on 04/07/19 echo   Arthritis    L knee, hands, back    Asthma    Diabetes mellitus type 2, insulin dependent (HCC)    Fibromyalgia    Full dentures    Full dentures    GERD (gastroesophageal reflux disease)    H/O hiatal hernia    Headache    h/o migraines - was followed for a time with a wellness doctor   History of esophageal dilatation    History of rhabdomyolysis    03/ 2015   Hyperlipidemia    Hypertension    Insulin pump in place    since 12/ 2014--  [facility]   Lumbar disc herniation    right leg weakness   Osteoarthritis of left knee, primary localized 08/17/2014   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   Renal insufficiency    Rhabdomyolysis 11/15/2013   SUI (stress urinary incontinence, female)    Syncope 12/27/2017   Wears glasses    Wears glasses     Objective:  Physical Exam: Vascular: DP/PT pulses 2/4 bilateral. CFT <3 seconds. Normal hair growth on digits. No edema.  Skin. No lacerations or abrasions bilateral feet.  Musculoskeletal: MMT 5/5 bilateral lower extremities in DF, PF, Inversion and Eversion. Deceased ROM in DF of ankle joint.  Tender over central tarsometatarsal joints. Pain with ROM of the subtalar and ankle joints.  Neurological: Sensation intact to light touch. Positive tinels sign over  dorsal cutaneous nerve.   Assessment:   1. Capsulitis of left foot      Plan:  Patient was evaluated and treated and all questions answered. Discussed midfoot arthritis and neuritis vs capsulitis  with patient and treatment options.  X-rays reviewed. No acute fractures or dislocations. Degenerative changes noted throughout midfoot.  Discussed NSAIDS, topicals, and possible injections.  Prescription for medrol dose pack provided. CAM boot provided.  Patient to follow-up 6 weeks or sooner if needed. If pain not improved will consider MRI vs NCV.     Lorenda Peck, DPM

## 2021-07-10 ENCOUNTER — Telehealth: Payer: Self-pay

## 2021-07-10 DIAGNOSIS — R8271 Bacteriuria: Secondary | ICD-10-CM | POA: Diagnosis not present

## 2021-07-10 DIAGNOSIS — N3020 Other chronic cystitis without hematuria: Secondary | ICD-10-CM | POA: Diagnosis not present

## 2021-07-10 NOTE — Chronic Care Management (AMB) (Addendum)
Chronic Care Management Pharmacy Assistant   Name: [name]  MRN: [id number] DOB: 10/09/1950  Reason for Encounter: Disease State/ Diabetes, Medication coordination  Recent office visits:  None  Recent consult visits:  07-03-2021 Lorenda Peck, MD (Podiatry). Orders placed for DG foot complete left. START medrol dosepack.  Hospital visits:  Medication Reconciliation was completed by comparing discharge summary, patient's EMR and Pharmacy list, and upon discussion with patient.  Admitted to the hospital on 05-30-2021 due to groin swelling. Discharge date was 05-30-2021 Discharged from [facility] [place]?Medications Started at [facility] Discharge:?? Keflex 500 mg twice daily for 5 days Nystatin cream twice daily  Medication Changes at Hospital Discharge: None  Medications Discontinued at Hospital Discharge: None  Medications that remain the same after Hospital Discharge:??  -All other medications will remain the same.    Medications: Outpatient Encounter Medications as of 07/10/2021  Medication Sig   pravastatin (PRAVACHOL) 40 MG tablet TAKE ONE TABLET BY MOUTH EVERYDAY AT BEDTIME   amLODipine (NORVASC) 5 MG tablet Take 1 tablet (5 mg total) by mouth at bedtime.   aspirin EC 81 MG EC tablet Take 1 tablet (81 mg total) by mouth daily. Swallow whole.   blood glucose meter kit and supplies KIT Dispense based on patient and insurance preference. Check blood sugars 4 times daily E11.69   cephALEXin (KEFLEX) 500 MG capsule Take 1 capsule (500 mg total) by mouth 2 (two) times daily.   Continuous Blood Gluc Receiver (FREESTYLE LIBRE READER) DEVI 1 each by Does not apply route See admin instructions. CGM device-Freestyle Libre   Continuous Blood Gluc Sensor (FREESTYLE LIBRE 14 DAY SENSOR) MISC 1 each by Does not apply route See admin instructions. CGM 14-day sensors; send refills to [employer] [facility] (in Epic)   FARXIGA 10 MG TABS tablet Take 10 mg  by mouth every morning.   Finerenone (KERENDIA) 10 MG TABS Take 1 tablet by mouth daily.   fluconazole (DIFLUCAN) 100 MG tablet Take 100 mg by mouth daily.   fluconazole (DIFLUCAN) 150 MG tablet Take 1 tablet (150 mg total) by mouth daily. (Patient not taking: Reported on 06/20/2021)   HUMALOG 100 UNIT/ML injection Inject 0.1 mLs (10 Units total) into the skin 3 (three) times daily with meals.   insulin degludec (TRESIBA FLEXTOUCH) 100 UNIT/ML FlexTouch Pen Inject 25 Units into the skin daily.   Insulin Pen Needle (BD PEN NEEDLE MICRO U/F) 32G X 6 MM MISC DX:E11.65 USE TO CHECK BLOOD SUGARS THREE TIMES A DAY   Lancets Misc. MISC 1 each by Does not apply route 4 (four) times daily -  with meals and at bedtime.   linaclotide (LINZESS) 145 MCG CAPS capsule Take 1 capsule (145 mcg total) by mouth daily before breakfast.   methylPREDNISolone (MEDROL DOSEPAK) 4 MG TBPK tablet Take as directed   Multiple Vitamin (MULTIVITAMIN WITH MINERALS) TABS tablet Take 1 tablet by mouth daily.   nystatin cream (MYCOSTATIN) Apply to affected area 2 times daily   ONETOUCH VERIO test strip USE TO test blood sugar FOUR TIMES DAILY   pantoprazole (PROTONIX) 40 MG tablet Take 1 tablet (40 mg total) by mouth daily.   pregabalin (LYRICA) 50 MG capsule Take 1 capsule (50 mg total) by mouth 3 (three) times daily.   Vibegron (GEMTESA) 75 MG TABS Take 75 mg by mouth daily.   Vitamin D, Ergocalciferol, (DRISDOL) 1.25 MG (50000 UNIT) CAPS capsule TAKE ONE CAPSULE BY MOUTH ONCE WEEKLY ON WEDNESDAY  No facility-administered encounter medications on file as of 07/10/2021.  Recent Relevant Labs: Lab Results  Component Value Date/Time   HGBA1C 11.2 (H) 06/20/2021 10:49 AM   HGBA1C 11.8 (H) 04/13/2021 03:04 PM   MICROALBUR 80 12/13/2020 05:01 PM   MICROALBUR 80 12/31/2019 03:19 PM    Kidney Function Lab Results  Component Value Date/Time   CREATININE 1.19 (H) 04/13/2021 03:04 PM   CREATININE 1.22 (H) 12/13/2020 03:32 PM    GFR 51.07 (L) 05/17/2017 08:54 AM   GFRNONAA 59 (L) 12/01/2020 08:59 AM   GFRAA 57 (L) 07/07/2020 05:11 PM    Current antihyperglycemic regimen:  Tresiba- inject 25 units into the skin daily  Humalog - inject 10 units three times per daily.   What recent interventions/DTPs have been made to improve glycemic control:  Educated on A1c and blood sugar goals; Exercise goal of 150 minutes per week; -Counseled to check feet daily and get yearly eye exams -Patient reports they have no availability at the endocrinologist so they sid they would notfiy her is someone cancels or cant make it.  -Recommend patient be started on additional agent, possibly Ozempic due to increase A1C  Have there been any recent hospitalizations or ED visits since last visit with CPP? No  Patient denies hypoglycemic symptoms  Patient reports hyperglycemic symptoms, including fatigue  How often are you checking your blood sugar? 3-4 times daily  What are your blood sugars ranging?  Fasting: 223, 308, 167 Before meals: 287, 312, 253 After meals: 257, 313, 297, 327 Bedtime: 225, 257, 287, 315  During the week, how often does your blood glucose drop below 70? Never  Are you checking your feet daily/regularly? Patient stated daily.  Adherence Review: Is the patient currently on a STATIN medication? Yes Is the patient currently on ACE/ARB medication? No Does the patient have >5 day gap between last estimated fill dates? No  NOTES: Patient stated she is still having high blood sugar readings. Patient stated she is getting frustrated because endocrinology hasn't scheduled initial appointment due to not having availability. Patient stated maybe she needs to be referred somewhere else. Sent message to [name] FNP and [name] CPP. Scheduled patient a follow up appointment with [name] CPP on 08-10-2021.  Reviewed chart for medication changes ahead of medication coordination call.  No OVs,  Consults, or hospital visits since last care coordination call/Pharmacist visit. (If appropriate, list visit date, provider name)  No medication changes indicated OR if recent visit, treatment plan here.  BP Readings from Last 3 Encounters:  06/20/21 130/82  05/30/21 (!) 168/97  05/03/21 140/72    Lab Results  Component Value Date   HGBA1C 11.2 (H) 06/20/2021     Patient obtains medications through Adherence Packaging  30 Days   Last adherence delivery included:  Pravastatin 40 mg- 1 tablet daily (bedtime) Amlodipine 5 mg- 1 tablet daily (bedtime) Pantoprazole 40 mg- 1 tablet daily (breakfast) Linzess 145 mcg- 1 tablet daily(before breakfast) Women's 50 Plus Multivitamin- 1 tablet daily (breakfast) Ergocalciferol 1250 mcg- 1 capsule weekly on Wednesdays (breakfast) Kerendia 10 mg daily at breakfast Tresiba 25 units daily  Patient declined (meds) last month: None  Patient is due for next adherence delivery on: 08-02-2021  Called patient and reviewed medications and coordinated delivery.  This delivery to include: Kerendia 10 mg daily at breakfast Tresiba 25 units daily Pravastatin 40 mg- 1 tablet daily (bedtime) Women's 50 Plus Multivitamin- 1 tablet daily (breakfast) Amlodipine 5 mg- 1 tablet daily (bedtime)  Pantoprazole 40 mg- 1 tablet daily (breakfast) Ergocalciferol 1250 mcg- 1 capsule weekly on Wednesdays (breakfast) Linzess 145 mcg- 1 tablet daily(before breakfast) Lyrica 50 mg 3 times daily  No short fill needed  Coordinated acute fill for Lyrica 50 mg 3 times daily to be delivered: 07-25-2021  Patient declined the following medications:  Patient needs refills for: Lyrica  Confirmed delivery date of 08-02-2021 advised patient that pharmacy will contact them the morning of delivery.  Care Gaps: Ophthalmology exam overdue Shingrix overdue Covid booster overdue AWV 01-24-2022  Star Rating Drugs: Pravastatin 40 mg- Last filled 07-04-2021 30 DS  [facility]  [place] Clinical Pharmacist Assistant [phone]

## 2021-07-11 ENCOUNTER — Telehealth: Payer: Medicare Other

## 2021-07-17 ENCOUNTER — Ambulatory Visit: Payer: Medicare Other

## 2021-07-18 DIAGNOSIS — Z794 Long term (current) use of insulin: Secondary | ICD-10-CM | POA: Diagnosis not present

## 2021-07-18 DIAGNOSIS — E118 Type 2 diabetes mellitus with unspecified complications: Secondary | ICD-10-CM | POA: Diagnosis not present

## 2021-07-23 ENCOUNTER — Encounter: Payer: Self-pay | Admitting: Nurse Practitioner

## 2021-07-24 ENCOUNTER — Ambulatory Visit (INDEPENDENT_AMBULATORY_CARE_PROVIDER_SITE_OTHER): Payer: Medicare Other

## 2021-07-24 ENCOUNTER — Other Ambulatory Visit: Payer: Self-pay | Admitting: Nurse Practitioner

## 2021-07-24 ENCOUNTER — Telehealth: Payer: Medicare Other

## 2021-07-24 DIAGNOSIS — E1165 Type 2 diabetes mellitus with hyperglycemia: Secondary | ICD-10-CM

## 2021-07-24 DIAGNOSIS — Z9889 Other specified postprocedural states: Secondary | ICD-10-CM

## 2021-07-24 DIAGNOSIS — I10 Essential (primary) hypertension: Secondary | ICD-10-CM

## 2021-07-24 DIAGNOSIS — E782 Mixed hyperlipidemia: Secondary | ICD-10-CM

## 2021-07-24 NOTE — Patient Instructions (Signed)
Visit Information   Thank you for taking time to visit with me today. Please don't hesitate to contact me if I can be of assistance to you before our next scheduled telephone appointment.  Following are the goals we discussed today:  (Copy and paste patient goals from clinical care plan here)  Our next appointment is by telephone on 08/08/21 at 3:45 PM  Please call the care guide team at [phone] if you need to cancel or reschedule your appointment.    Following is a copy of your full care plan:   Care Plan : [place] of Care  Updates made by Tariyah Pendry, Claudette Stapler, RN since 07/24/2021 12:00 AM     Problem: No plan established for management of chronic disease states (DM, HTN, HLD, s/p laminectomy)   Priority: High     Long-Range Goal: Development of plan of care for chronic disease management (DM, HTN, HLD, s/p laminectomy)   Start Date: 07/24/2021  Expected End Date: 07/24/2022  This Visit's Progress: On track  Priority: High  Note:   Current Barriers:  Knowledge Deficits related to plan of care for management of DM, HTN, HLD, s/p laminectomy   Chronic Disease Management support and education needs related to DM, HTN, HLD, s/p laminectomy    RNCM Clinical Goal(s):  Patient will verbalize basic understanding of  DM, HTN, HLD, s/p laminectomy  disease process and self health management plan as evidenced by patient will experience no disease exacerbations related to chronic disease states take all medications exactly as prescribed and will call provider for medication related questions as evidenced by patient will report having no missed doses of prescribed medications demonstrate Improved health management independence as evidenced by patient will continue to stay independent with self care continue to work with RN Care Manager to address care management and care coordination needs related to  DM, HTN, HLD, s/p laminectomy  as evidenced by adherence to CM Team Scheduled  appointments demonstrate ongoing self health care management ability   as evidenced by    through collaboration with RN Care manager, provider, and care team.   Interventions: 1:1 collaboration with primary care provider regarding development and update of comprehensive plan of care as evidenced by provider attestation and co-signature Inter-disciplinary care team collaboration (see longitudinal plan of care) Evaluation of current treatment plan related to  self management and patient's adherence to plan as established by provider  Diabetes Interventions:  (Status:  Goal on track:  Yes.) Long Term Goal Assessed patient's understanding of A1c goal:  <7.5 Completed inbound call with patient; determined patient contacted Dr. Kelton Pillar regarding Endocrinology referral sent by PCP in September; Determined patient was advised Dr. Kelton Pillar has not reviewed her medical chart to determine if patient will be excepted as a patient Discussed patient continues to have "highs and low" with today's am FBS at 133, however she is having more highs than lows Collaborated with PCP [name] FNP regarding delay in Dr. Kelton Pillar reviewing/accepting patient for evaluation/treatment of DMII Determined Doreene Burke FNP will resend new referral to a [facility] provider, patient informed and notified of normal referral process, patient verbalizes understanding of referral process and next steps Educated patient on the importance of using portion control, especially with holiday meals  Counseled on importance of regular laboratory monitoring as prescribed Discussed plans with patient for ongoing care management follow up and provided patient with direct contact information for care management team  Lab Results  Component Value Date   HGBA1C 11.2 (  H) 06/20/2021   Patient Goals/Self-Care Activities: Take all medications as prescribed Attend all scheduled provider appointments Call pharmacy for medication refills 3-7  days in advance of running out of medications Attend church or other social activities Perform all self care activities independently  Perform IADL's (shopping, preparing meals, housekeeping, managing finances) independently Call provider office for new concerns or questions  schedule appointment with eye doctor check feet daily for cuts, sores or redness enter blood sugar readings and medication or insulin into daily log drink 6 to 8 glasses of water each day manage portion size  Follow Up Plan:  Telephone follow up appointment with care management team member scheduled for:   08/08/21      Consent to CCM Services: Ms. Stachnik was given information about Chronic Care Management services including:  CCM service includes personalized support from designated clinical staff supervised by her physician, including individualized plan of care and coordination with other care providers 24/7 contact phone numbers for assistance for urgent and routine care needs. Service will only be billed when office clinical staff spend 20 minutes or more in a month to coordinate care. Only one practitioner may furnish and bill the service in a calendar month. The patient may stop CCM services at any time (effective at the end of the month) by phone call to the office staff. The patient will be responsible for cost sharing (co-pay) of up to 20% of the service fee (after annual deductible is met).  Patient agreed to services and verbal consent obtained.   Patient verbalizes understanding of instructions provided today and agrees to view in [place].   Telephone follow up appointment with care management team member scheduled for: 08/08/21

## 2021-07-24 NOTE — Chronic Care Management (AMB) (Signed)
Chronic Care Management   CCM RN Visit Note  07/24/2021 Name: [name] MRN: [id number] DOB: 08/05/1951  Subjective: [name] is a 70 y.o. year old female who is a primary care patient of [name], [place]. The care management team was consulted for assistance with disease management and care coordination needs.    Engaged with patient by telephone for follow up visit in response to provider referral for case management and/or care coordination services.   Consent to Services:  The patient was given information about Chronic Care Management services, agreed to services, and gave verbal consent prior to initiation of services.  Please see initial visit note for detailed documentation.   Patient agreed to services and verbal consent obtained.   Assessment: Review of patient past medical history, allergies, medications, health status, including review of consultants reports, laboratory and other test data, was performed as part of comprehensive evaluation and provision of chronic care management services.   SDOH (Social Determinants of Health) assessments and interventions performed:  Yes, no acute challenges   CCM Care Plan  Allergies  Allergen Reactions   Pollen Extract Other (See Comments)    Congestion/sneezing   Tomato Hives and Itching   Codeine Hives, Itching and Nausea And Vomiting    Outpatient Encounter Medications as of 07/24/2021  Medication Sig   pravastatin (PRAVACHOL) 40 MG tablet TAKE ONE TABLET BY MOUTH EVERYDAY AT BEDTIME   amLODipine (NORVASC) 5 MG tablet Take 1 tablet (5 mg total) by mouth at bedtime.   aspirin EC 81 MG EC tablet Take 1 tablet (81 mg total) by mouth daily. Swallow whole.   blood glucose meter kit and supplies KIT Dispense based on patient and insurance preference. Check blood sugars 4 times daily E11.69   cephALEXin (KEFLEX) 500 MG capsule Take 1 capsule (500 mg total) by mouth 2 (two) times daily.   Continuous Blood Gluc  Receiver (FREESTYLE LIBRE READER) DEVI 1 each by Does not apply route See admin instructions. CGM device-Freestyle Libre   Continuous Blood Gluc Sensor (FREESTYLE LIBRE 14 DAY SENSOR) MISC 1 each by Does not apply route See admin instructions. CGM 14-day sensors; send refills to [employer] (in Epic)   FARXIGA 10 MG TABS tablet Take 10 mg by mouth every morning.   Finerenone (KERENDIA) 10 MG TABS Take 1 tablet by mouth daily.   fluconazole (DIFLUCAN) 100 MG tablet Take 100 mg by mouth daily.   fluconazole (DIFLUCAN) 150 MG tablet Take 1 tablet (150 mg total) by mouth daily. (Patient not taking: Reported on 06/20/2021)   HUMALOG 100 UNIT/ML injection Inject 0.1 mLs (10 Units total) into the skin 3 (three) times daily with meals.   insulin degludec (TRESIBA FLEXTOUCH) 100 UNIT/ML FlexTouch Pen Inject 25 Units into the skin daily.   Insulin Pen Needle (BD PEN NEEDLE MICRO U/F) 32G X 6 MM MISC DX:E11.65 USE TO CHECK BLOOD SUGARS THREE TIMES A DAY   Lancets Misc. MISC 1 each by Does not apply route 4 (four) times daily -  with meals and at bedtime.   linaclotide (LINZESS) 145 MCG CAPS capsule Take 1 capsule (145 mcg total) by mouth daily before breakfast.   Multiple Vitamin (MULTIVITAMIN WITH MINERALS) TABS tablet Take 1 tablet by mouth daily.   nystatin cream (MYCOSTATIN) Apply to affected area 2 times daily   ONETOUCH VERIO test strip USE TO test blood sugar FOUR TIMES DAILY   pantoprazole (PROTONIX) 40 MG tablet Take 1 tablet (40 mg total) by mouth  daily.   pregabalin (LYRICA) 50 MG capsule Take 1 capsule (50 mg total) by mouth 3 (three) times daily.   Vibegron (GEMTESA) 75 MG TABS Take 75 mg by mouth daily.   Vitamin D, Ergocalciferol, (DRISDOL) 1.25 MG (50000 UNIT) CAPS capsule TAKE ONE CAPSULE BY MOUTH ONCE WEEKLY ON WEDNESDAY   No facility-administered encounter medications on file as of 07/24/2021.    Patient Active Problem List   Diagnosis Date Noted   Abdominal bloating 07/03/2021    Chronic idiopathic constipation 07/03/2021   Colon cancer screening 07/03/2021   Diverticulosis of large intestine without perforation or abscess without bleeding 07/03/2021   Dysphagia 07/03/2021   Flatulence, eructation and gas pain 07/03/2021   Hiatal hernia 07/03/2021   Quadriceps weakness 02/06/2021   Syncope 11/29/2020   Mild aortic stenosis 11/29/2020   Irritable bowel syndrome with diarrhea 10/10/2020   Gastroesophageal reflux disease with esophagitis 10/10/2020   Postlaminectomy syndrome, not elsewhere classified 07/03/2020   S/P lumbar laminectomy 03/30/2020   Localized swelling, mass and lump, multiple sites 03/27/2018   Type II diabetes mellitus with renal manifestations ([place]) 12/27/2017   HLD (hyperlipidemia) 12/27/2017   HTN (hypertension) 12/27/2017   CKD (chronic kidney disease), stage III ([place]) 12/27/2017   Cough variant asthma 12/01/2015   Osteoarthritis of left knee, primary localized 08/17/2014   Knee osteoarthritis 08/17/2014   Hypoglycemia 11/15/2013   Other and unspecified hyperlipidemia 08/06/2013   Type II diabetes mellitus, uncontrolled 07/20/2013   Varicose veins of lower extremities with other complications 28/78/6767   Fibromyalgia    GERD 05/18/2010   ABDOMINAL PAIN, GENERALIZED 05/02/2010   OBESITY 04/20/2010   BURSITIS, LEFT SHOULDER 03/09/2010   Lipoma of arm s/p excision 01/29/2014 01/10/2010   DEPRESSION 12/27/2009   BACK PAIN WITH RADICULOPATHY 12/27/2009   CYSTITIS, ACUTE 10/21/2009   MICROSCOPIC HEMATURIA 10/18/2009   KNEE PAIN, BILATERAL 10/18/2009   NEUROPATHY 09/08/2009   Asthma 09/08/2009   STRESS INCONTINENCE 09/08/2009   CHEST PAIN 07/19/2009   ESOPHAGEAL STRICTURE 08/27/2005    Conditions to be addressed/monitored: DM, HTN, HLD, s/p laminectomy   Care Plan : RN Care Manager Plan of Care  Updates made by [name], RN since 07/24/2021 12:00 AM     Problem: No plan established for management of chronic disease states  (DM, HTN, HLD, s/p laminectomy)   Priority: High     Long-Range Goal: Development of plan of care for chronic disease management (DM, HTN, HLD, s/p laminectomy)   Start Date: 07/24/2021  Expected End Date: 07/24/2022  This Visit's Progress: On track  Priority: High  Note:   Current Barriers:  Knowledge Deficits related to plan of care for management of DM, HTN, HLD, s/p laminectomy   Chronic Disease Management support and education needs related to DM, HTN, HLD, s/p laminectomy    RNCM Clinical Goal(s):  Patient will verbalize basic understanding of  DM, HTN, HLD, s/p laminectomy  disease process and self health management plan as evidenced by patient will experience no disease exacerbations related to chronic disease states take all medications exactly as prescribed and will call provider for medication related questions as evidenced by patient will report having no missed doses of prescribed medications demonstrate Improved health management independence as evidenced by patient will continue to stay independent with self care continue to work with RN Care Manager to address care management and care coordination needs related to  DM, HTN, HLD, s/p laminectomy  as evidenced by adherence to CM Team Scheduled appointments demonstrate ongoing  self health care management ability   as evidenced by    through collaboration with RN Care manager, provider, and care team.   Interventions: 1:1 collaboration with primary care provider regarding development and update of comprehensive plan of care as evidenced by provider attestation and co-signature Inter-disciplinary care team collaboration (see longitudinal plan of care) Evaluation of current treatment plan related to  self management and patient's adherence to plan as established by provider  Diabetes Interventions:  (Status:  Goal on track:  Yes.) Long Term Goal Assessed patient's understanding of A1c goal:  <7.5 Completed inbound call with  patient; determined patient contacted Dr. Kelton Pillar regarding Endocrinology referral sent by PCP in September; Determined patient was advised Dr. Kelton Pillar has not reviewed her medical chart to determine if patient will be excepted as a patient Discussed patient continues to have "highs and low" with today's am FBS at 133, however she is having more highs than lows Collaborated with PCP [name] FNP regarding delay in Dr. Kelton Pillar reviewing/accepting patient for evaluation/treatment of DMII Determined Doreene Burke FNP will resend new referral to a [facility] provider, patient informed and notified of normal referral process, patient verbalizes understanding of referral process and next steps Educated patient on the importance of using portion control, especially with holiday meals  Counseled on importance of regular laboratory monitoring as prescribed Discussed plans with patient for ongoing care management follow up and provided patient with direct contact information for care management team  Lab Results  Component Value Date   HGBA1C 11.2 (H) 06/20/2021   Patient Goals/Self-Care Activities: Take all medications as prescribed Attend all scheduled provider appointments Call pharmacy for medication refills 3-7 days in advance of running out of medications Attend church or other social activities Perform all self care activities independently  Perform IADL's (shopping, preparing meals, housekeeping, managing finances) independently Call provider office for new concerns or questions  schedule appointment with eye doctor check feet daily for cuts, sores or redness enter blood sugar readings and medication or insulin into daily log drink 6 to 8 glasses of water each day manage portion size  Follow Up Plan:  Telephone follow up appointment with care management team member scheduled for:  08/08/21     Plan:Telephone follow up appointment with care management team member scheduled for:   08/08/21  [name], RN, BSN, CCM Care Management Coordinator [place] [facility]  Direct Phone: [phone]

## 2021-07-26 DIAGNOSIS — I10 Essential (primary) hypertension: Secondary | ICD-10-CM | POA: Diagnosis not present

## 2021-07-26 DIAGNOSIS — E1165 Type 2 diabetes mellitus with hyperglycemia: Secondary | ICD-10-CM | POA: Diagnosis not present

## 2021-07-26 DIAGNOSIS — E782 Mixed hyperlipidemia: Secondary | ICD-10-CM | POA: Diagnosis not present

## 2021-07-27 DIAGNOSIS — N3020 Other chronic cystitis without hematuria: Secondary | ICD-10-CM | POA: Diagnosis not present

## 2021-08-06 ENCOUNTER — Encounter: Payer: Self-pay | Admitting: Nurse Practitioner

## 2021-08-07 NOTE — Telephone Encounter (Signed)
Does she have [place] or Dexcom? If not, let's get her set up with one. Why were her sugars so low at breakfast 12/8? What was different?   It appears she needs to increase [place] to 27 units nightly. Is she drinking sodas or other sweetened beverages?

## 2021-08-08 ENCOUNTER — Telehealth: Payer: Medicare Other

## 2021-08-08 ENCOUNTER — Ambulatory Visit: Payer: Self-pay

## 2021-08-08 ENCOUNTER — Telehealth: Payer: Self-pay

## 2021-08-08 DIAGNOSIS — Z9889 Other specified postprocedural states: Secondary | ICD-10-CM

## 2021-08-08 DIAGNOSIS — E782 Mixed hyperlipidemia: Secondary | ICD-10-CM

## 2021-08-08 DIAGNOSIS — E1165 Type 2 diabetes mellitus with hyperglycemia: Secondary | ICD-10-CM

## 2021-08-08 DIAGNOSIS — I10 Essential (primary) hypertension: Secondary | ICD-10-CM

## 2021-08-08 NOTE — Telephone Encounter (Signed)
Advised pt to increase tresiba to 27 units.   Pt called with BS readings. Asked pt if she knew what could be making her BS elevated. She stated that she drinks juices. Explained to pt that orange juice is known to cause elevated bs reading. Pt stated that she has increased her water intake at the providers request but she is not stopping her juices.

## 2021-08-08 NOTE — Patient Instructions (Signed)
Visit Information   Thank you for taking time to visit with me today. Please don't hesitate to contact me if I can be of assistance to you before our next scheduled telephone appointment.  Following are the goals we discussed today:  (Copy and paste patient goals from clinical care plan here)  Our next appointment is by telephone on 10/09/21 at 11:05 AM  Please call the care guide team at [phone] if you need to cancel or reschedule your appointment.   If you are experiencing a Mental Health or [place] or need someone to talk to, please call [phone]-TALK (toll free, 24 hour hotline)   Following is a copy of your full care plan:  Care Plan : [place] of Care  Updates made by [name], RN since 08/08/2021 12:00 AM     Problem: No plan established for management of chronic disease states (DM, HTN, HLD, s/p laminectomy)   Priority: High     Long-Range Goal: Development of plan of care for chronic disease management (DM, HTN, HLD, s/p laminectomy)   Start Date: 07/24/2021  Expected End Date: 07/24/2022  Recent Progress: On track  Priority: High  Note:   Current Barriers:  Knowledge Deficits related to plan of care for management of DM, HTN, HLD, s/p laminectomy   Chronic Disease Management support and education needs related to DM, HTN, HLD, s/p laminectomy    RNCM Clinical Goal(s):  Patient will verbalize basic understanding of  DM, HTN, HLD, s/p laminectomy  disease process and self health management plan as evidenced by patient will experience no disease exacerbations related to chronic disease states take all medications exactly as prescribed and will call provider for medication related questions as evidenced by patient will report having no missed doses of prescribed medications demonstrate Improved health management independence as evidenced by patient will continue to stay independent with self care continue to work with RN Care Manager  to address care management and care coordination needs related to  DM, HTN, HLD, s/p laminectomy  as evidenced by adherence to CM Team Scheduled appointments demonstrate ongoing self health care management ability   as evidenced by    through collaboration with RN Care manager, provider, and care team.   Interventions: 1:1 collaboration with primary care provider regarding development and update of comprehensive plan of care as evidenced by provider attestation and co-signature Inter-disciplinary care team collaboration (see longitudinal plan of care) Evaluation of current treatment plan related to DM, HTN, HLD, s/p laminectomy self management and patient's adherence to plan as established by provider  Diabetes Interventions:  (Status:  Goal on track:  Yes.) Long Term Goal Evaluation of current treatment plan related to DM self management and patient's adherence to plan as established by provider Reviewed medications with patient and discussed importance of medication adherence Determined patient verbalizes understanding to increase her [name] to 27 units at bedtime per Dr. Baird Cancer Educated patient on the importance to limit or restrict juices and or soda Provided patient the name/contact number and location for [place], Jacolyn Reedy NP and instructed patient to contact this provider to schedule a new patient appointment as referral has been authorized and is ready to schedule Discussed plans with patient for ongoing care management follow up and provided patient with direct contact information for care management team  Lab Results  Component Value Date   HGBA1C 11.2 (H) 06/20/2021   Patient Goals/Self-Care Activities: Take all medications as prescribed Attend all scheduled provider  appointments Call pharmacy for medication refills 3-7 days in advance of running out of medications Attend church or other social activities Perform all self care activities independently   Perform IADL's (shopping, preparing meals, housekeeping, managing finances) independently Call provider office for new concerns or questions  schedule appointment with eye doctor check feet daily for cuts, sores or redness enter blood sugar readings and medication or insulin into daily log drink 6 to 8 glasses of water each day manage portion size  Follow Up Plan:  Telephone follow up appointment with care management team member scheduled for:  10/09/21      Consent to CCM Services: Ms. Kachel was given information about Chronic Care Management services including:  CCM service includes personalized support from designated clinical staff supervised by her physician, including individualized plan of care and coordination with other care providers 24/7 contact phone numbers for assistance for urgent and routine care needs. Service will only be billed when office clinical staff spend 20 minutes or more in a month to coordinate care. Only one practitioner may furnish and bill the service in a calendar month. The patient may stop CCM services at any time (effective at the end of the month) by phone call to the office staff. The patient will be responsible for cost sharing (co-pay) of up to 20% of the service fee (after annual deductible is met).  Patient agreed to services and verbal consent obtained.   Patient verbalizes understanding of instructions provided today and agrees to view in [place].   Telephone follow up appointment with care management team member scheduled for: 10/09/21

## 2021-08-08 NOTE — Chronic Care Management (AMB) (Signed)
Chronic Care Management   CCM RN Visit Note  08/08/2021 Name: [name] MRN: [id number] DOB: [birth year]  Subjective: [name] is a 70 y.o. year old female who is a primary care patient of [name], [place]. The care management team was consulted for assistance with disease management and care coordination needs.    Engaged with patient by telephone for follow up visit in response to provider referral for case management and/or care coordination services.   Consent to Services:  The patient was given information about Chronic Care Management services, agreed to services, and gave verbal consent prior to initiation of services.  Please see initial visit note for detailed documentation.   Patient agreed to services and verbal consent obtained.   Assessment: Review of patient past medical history, allergies, medications, health status, including review of consultants reports, laboratory and other test data, was performed as part of comprehensive evaluation and provision of chronic care management services.   SDOH (Social Determinants of Health) assessments and interventions performed:  yes, no acute challenges  CCM Care Plan  Allergies  Allergen Reactions   Pollen Extract Other (See Comments)    Congestion/sneezing   Tomato Hives and Itching   Codeine Hives, Itching and Nausea And Vomiting    Outpatient Encounter Medications as of 08/08/2021  Medication Sig   pravastatin (PRAVACHOL) 40 MG tablet TAKE ONE TABLET BY MOUTH EVERYDAY AT BEDTIME   amLODipine (NORVASC) 5 MG tablet Take 1 tablet (5 mg total) by mouth at bedtime.   aspirin EC 81 MG EC tablet Take 1 tablet (81 mg total) by mouth daily. Swallow whole.   blood glucose meter kit and supplies KIT Dispense based on patient and insurance preference. Check blood sugars 4 times daily E11.69   cephALEXin (KEFLEX) 500 MG capsule Take 1 capsule (500 mg total) by mouth 2 (two) times daily.   Continuous Blood Gluc  Receiver (FREESTYLE LIBRE READER) DEVI 1 each by Does not apply route See admin instructions. CGM device-Freestyle Libre   Continuous Blood Gluc Sensor (FREESTYLE LIBRE 14 DAY SENSOR) MISC 1 each by Does not apply route See admin instructions. CGM 14-day sensors; send refills to [employer] [facility] (in Epic)   FARXIGA 10 MG TABS tablet Take 10 mg by mouth every morning.   Finerenone (KERENDIA) 10 MG TABS Take 1 tablet by mouth daily.   fluconazole (DIFLUCAN) 100 MG tablet Take 100 mg by mouth daily.   fluconazole (DIFLUCAN) 150 MG tablet Take 1 tablet (150 mg total) by mouth daily. (Patient not taking: Reported on 06/20/2021)   HUMALOG 100 UNIT/ML injection Inject 0.1 mLs (10 Units total) into the skin 3 (three) times daily with meals.   insulin degludec (TRESIBA FLEXTOUCH) 100 UNIT/ML FlexTouch Pen Inject 25 Units into the skin daily.   Insulin Pen Needle (BD PEN NEEDLE MICRO U/F) 32G X 6 MM MISC DX:E11.65 USE TO CHECK BLOOD SUGARS THREE TIMES A DAY   Lancets Misc. MISC 1 each by Does not apply route 4 (four) times daily -  with meals and at bedtime.   linaclotide (LINZESS) 145 MCG CAPS capsule Take 1 capsule (145 mcg total) by mouth daily before breakfast.   Multiple Vitamin (MULTIVITAMIN WITH MINERALS) TABS tablet Take 1 tablet by mouth daily.   nystatin cream (MYCOSTATIN) Apply to affected area 2 times daily   ONETOUCH VERIO test strip USE TO test blood sugar FOUR TIMES DAILY   pantoprazole (PROTONIX) 40 MG tablet Take 1 tablet (40 mg total) by mouth daily.  pregabalin (LYRICA) 50 MG capsule Take 1 capsule (50 mg total) by mouth 3 (three) times daily.   Vibegron (GEMTESA) 75 MG TABS Take 75 mg by mouth daily.   Vitamin D, Ergocalciferol, (DRISDOL) 1.25 MG (50000 UNIT) CAPS capsule TAKE ONE CAPSULE BY MOUTH ONCE WEEKLY ON WEDNESDAY   No facility-administered encounter medications on file as of 08/08/2021.    Patient Active Problem List   Diagnosis Date Noted   Abdominal bloating 07/03/2021    Chronic idiopathic constipation 07/03/2021   Colon cancer screening 07/03/2021   Diverticulosis of large intestine without perforation or abscess without bleeding 07/03/2021   Dysphagia 07/03/2021   Flatulence, eructation and gas pain 07/03/2021   Hiatal hernia 07/03/2021   Quadriceps weakness 02/06/2021   Syncope 11/29/2020   Mild aortic stenosis 11/29/2020   Irritable bowel syndrome with diarrhea 10/10/2020   Gastroesophageal reflux disease with esophagitis 10/10/2020   Postlaminectomy syndrome, not elsewhere classified 07/03/2020   S/P lumbar laminectomy 03/30/2020   Localized swelling, mass and lump, multiple sites 03/27/2018   Type II diabetes mellitus with renal manifestations ([place]) 12/27/2017   HLD (hyperlipidemia) 12/27/2017   HTN (hypertension) 12/27/2017   CKD (chronic kidney disease), stage III ([place]) 12/27/2017   Cough variant asthma 12/01/2015   Osteoarthritis of left knee, primary localized 08/17/2014   Knee osteoarthritis 08/17/2014   Hypoglycemia 11/15/2013   Other and unspecified hyperlipidemia 08/06/2013   Type II diabetes mellitus, uncontrolled 07/20/2013   Varicose veins of lower extremities with other complications 26/33/3545   Fibromyalgia    GERD 05/18/2010   ABDOMINAL PAIN, GENERALIZED 05/02/2010   OBESITY 04/20/2010   BURSITIS, LEFT SHOULDER 03/09/2010   Lipoma of arm s/p excision 01/29/2014 01/10/2010   DEPRESSION 12/27/2009   BACK PAIN WITH RADICULOPATHY 12/27/2009   CYSTITIS, ACUTE 10/21/2009   MICROSCOPIC HEMATURIA 10/18/2009   KNEE PAIN, BILATERAL 10/18/2009   NEUROPATHY 09/08/2009   Asthma 09/08/2009   STRESS INCONTINENCE 09/08/2009   CHEST PAIN 07/19/2009   ESOPHAGEAL STRICTURE 08/27/2005    Conditions to be addressed/monitored: DM, HTN, HLD, s/p laminectomy   Care Plan : RN Care Manager Plan of Care  Updates made by [name], RN since 08/08/2021 12:00 AM     Problem: No plan established for management of chronic disease states  (DM, HTN, HLD, s/p laminectomy)   Priority: High     Long-Range Goal: Development of plan of care for chronic disease management (DM, HTN, HLD, s/p laminectomy)   Start Date: 07/24/2021  Expected End Date: 07/24/2022  Recent Progress: On track  Priority: High  Note:   Current Barriers:  Knowledge Deficits related to plan of care for management of DM, HTN, HLD, s/p laminectomy   Chronic Disease Management support and education needs related to DM, HTN, HLD, s/p laminectomy    RNCM Clinical Goal(s):  Patient will verbalize basic understanding of  DM, HTN, HLD, s/p laminectomy  disease process and self health management plan as evidenced by patient will experience no disease exacerbations related to chronic disease states take all medications exactly as prescribed and will call provider for medication related questions as evidenced by patient will report having no missed doses of prescribed medications demonstrate Improved health management independence as evidenced by patient will continue to stay independent with self care continue to work with RN Care Manager to address care management and care coordination needs related to  DM, HTN, HLD, s/p laminectomy  as evidenced by adherence to CM Team Scheduled appointments demonstrate ongoing self health care management  ability   as evidenced by    through collaboration with Consulting civil engineer, provider, and care team.   Interventions: 1:1 collaboration with primary care provider regarding development and update of comprehensive plan of care as evidenced by provider attestation and co-signature Inter-disciplinary care team collaboration (see longitudinal plan of care) Evaluation of current treatment plan related to DM, HTN, HLD, s/p laminectomy self management and patient's adherence to plan as established by provider  Diabetes Interventions:  (Status:  Goal on track:  Yes.) Long Term Goal Evaluation of current treatment plan related to DM self  management and patient's adherence to plan as established by provider Reviewed medications with patient and discussed importance of medication adherence Determined patient verbalizes understanding to increase her Tresiba to 27 units at bedtime per Dr. Baird Cancer Educated patient on the importance to limit or restrict juices and or soda Provided patient the name/contact number and location for [place], Jacolyn Reedy NP and instructed patient to contact this provider to schedule a new patient appointment as referral has been authorized and is ready to schedule Discussed plans with patient for ongoing care management follow up and provided patient with direct contact information for care management team  Lab Results  Component Value Date   HGBA1C 11.2 (H) 06/20/2021   Patient Goals/Self-Care Activities: Take all medications as prescribed Attend all scheduled provider appointments Call pharmacy for medication refills 3-7 days in advance of running out of medications Attend church or other social activities Perform all self care activities independently  Perform IADL's (shopping, preparing meals, housekeeping, managing finances) independently Call provider office for new concerns or questions  schedule appointment with eye doctor check feet daily for cuts, sores or redness enter blood sugar readings and medication or insulin into daily log drink 6 to 8 glasses of water each day manage portion size  Follow Up Plan:  Telephone follow up appointment with care management team member scheduled for:  10/09/21      Plan:Telephone follow up appointment with care management team member scheduled for:  10/09/21  [name], RN, BSN, CCM Care Management Coordinator [place] [facility]  Direct Phone: [phone]

## 2021-08-08 NOTE — Progress Notes (Signed)
This encounter was created in error - please disregard.

## 2021-08-09 ENCOUNTER — Telehealth: Payer: Self-pay

## 2021-08-09 NOTE — Chronic Care Management (AMB) (Signed)
° ° °  [name] was reminded to have all medications, supplements and any blood glucose and blood pressure readings available for review with [name], Pharm. D, at her telephone visit on 08-09-2021 at 9:30.   Questions: Have you had any recent office visit or specialist visit outside of [place]? Patient stated no  Are there any concerns you would like to discuss during your office visit? Patient stated no  Are you having any problems obtaining your medications? (Whether it pharmacy issues or cost) Patient stated no  If patient has any PAP medications ask if they are having any problems getting their PAP medication or refill? No PAP medications  Care Gaps: Ophthalmology exam overdue Shingrix overdue Covid booster overdue AWV 01-24-2022  Star Rating Drug: Pravastatin 40 mg- Last filled 07-26-2021 30 [facility]  Any gaps in medications fill history? No  [place] Pharmacist Assistant [phone]

## 2021-08-10 ENCOUNTER — Ambulatory Visit (INDEPENDENT_AMBULATORY_CARE_PROVIDER_SITE_OTHER): Payer: Medicare Other

## 2021-08-10 DIAGNOSIS — E1165 Type 2 diabetes mellitus with hyperglycemia: Secondary | ICD-10-CM

## 2021-08-10 DIAGNOSIS — I10 Essential (primary) hypertension: Secondary | ICD-10-CM

## 2021-08-10 NOTE — Progress Notes (Signed)
Chronic Care Management Pharmacy Note  08/15/2021 Name:  [name] MRN:  [id number] DOB:  1951-05-17  Summary: Patient reports that she is checking her BS frequently.   Recommendations/Changes made from today's visit: Recommend patient cut back on juice to once per day.   Plan: Patient reports she is going to limit the amount of juice she drinks to a small 4 ounce cup.    Subjective: [name] is an 70 y.o. year old female who is a primary patient of [name], [place].  The CCM team was consulted for assistance with disease management and care coordination needs.    Engaged with patient by telephone for follow up visit in response to provider referral for pharmacy case management and/or care coordination services.   Consent to Services:  The patient was given information about Chronic Care Management services, agreed to services, and gave verbal consent prior to initiation of services.  Please see initial visit note for detailed documentation.   Patient Care Team: [name], FNP as PCP - General (General Practice) Elayne Snare, MD as Consulting Physician (Endocrinology) Bjorn Loser, MD as Consulting Physician (Urology) Juanita Craver, MD as Consulting Physician (Gastroenterology) Rex Kras Claudette Stapler, RN as Case Manager Mayford Knife, [facility] (Pharmacist)  Recent office visits: 06/20/2021 PCP OV  Recent consult visits: 07/03/2021 [facility] visits: 05/30/2021 ED Visit- Yeast Vaginitis   Objective:  Lab Results  Component Value Date   CREATININE 1.19 (H) 04/13/2021   BUN 10 04/13/2021   GFR 51.07 (L) 05/17/2017   GFRNONAA 59 (L) 12/01/2020   GFRAA 57 (L) 07/07/2020   NA 131 (L) 04/13/2021   K 3.9 04/13/2021   CALCIUM 8.7 04/13/2021   CO2 21 04/13/2021   GLUCOSE 499 (H) 04/13/2021    Lab Results  Component Value Date/Time   HGBA1C 11.2 (H) 06/20/2021 10:49 AM   HGBA1C 11.8 (H) 04/13/2021 03:04 PM   FRUCTOSAMINE 388 (H)  05/17/2017 08:54 AM   FRUCTOSAMINE 286 (H) 07/20/2014 10:07 AM   GFR 51.07 (L) 05/17/2017 08:54 AM   GFR 64.19 02/20/2017 11:52 AM   MICROALBUR 80 12/13/2020 05:01 PM   MICROALBUR 80 12/31/2019 03:19 PM    Last diabetic Eye exam: No results found for: HMDIABEYEEXA  Last diabetic Foot exam: No results found for: HMDIABFOOTEX   Lab Results  Component Value Date   CHOL 168 12/31/2019   HDL 54 12/31/2019   LDLCALC 98 12/31/2019   LDLDIRECT 152.8 10/09/2013   TRIG 88 12/31/2019   CHOLHDL 3.1 12/31/2019    Hepatic Function Latest Ref Rng & Units 04/13/2021 07/07/2020 05/03/2020  Total Protein 6.0 - 8.5 g/dL 6.1 7.0 7.1  Albumin 3.8 - 4.8 g/dL 3.6(L) 4.2 4.0  AST 0 - 40 IU/L 11 19 12   ALT 0 - 32 IU/L 6 17 11   Alk Phosphatase 44 - 121 IU/L 108 107 130(H)  Total Bilirubin 0.0 - 1.2 mg/dL 0.6 0.3 0.7  Bilirubin, Direct 0.00 - 0.40 mg/dL - - -    Lab Results  Component Value Date/Time   TSH 1.990 06/09/2018 12:42 PM   TSH 2.047 05/31/2010 11:11 PM    CBC Latest Ref Rng & Units 04/13/2021 12/13/2020 12/01/2020  WBC 3.4 - 10.8 x10E3/uL 5.9 5.7 4.3  Hemoglobin 11.1 - 15.9 g/dL 13.3 15.1 13.9  Hematocrit 34.0 - 46.6 % 41.3 46.5 42.7  Platelets 150 - 450 x10E3/uL 274 289 238    Lab Results  Component Value Date/Time   VD25OH 34.4  05/03/2020 02:55 PM   VD25OH 41.4 12/31/2019 04:11 PM    Clinical ASCVD: No  The 10-year ASCVD risk score (Arnett DK, et al., 2019) is: 39.7%   Values used to calculate the score:     Age: 22 years     Sex: Female     Is Non-Hispanic African American: Yes     Diabetic: Yes     Tobacco smoker: Yes     Systolic Blood Pressure: 588 mmHg     Is BP treated: Yes     HDL Cholesterol: 54 mg/dL     Total Cholesterol: 168 mg/dL    Depression screen [facility] 2/9 01/11/2021 12/31/2019 10/22/2019  Decreased Interest 0 0 0  Down, Depressed, Hopeless 0 0 0  PHQ - 2 Score 0 0 0  Altered sleeping - 0 -  Tired, decreased energy - 0 -  Change in appetite - 0 -  Feeling bad  or failure about yourself  - 0 -  Trouble concentrating - 0 -  Moving slowly or fidgety/restless - 0 -  Suicidal thoughts - 0 -  PHQ-9 Score - 0 -  Difficult doing work/chores - Not difficult at all -  Some recent data might be hidden      Social History   Tobacco Use  Smoking Status Never  Smokeless Tobacco Never   BP Readings from Last 3 Encounters:  06/20/21 130/82  05/30/21 (!) 168/97  05/03/21 140/72   Pulse Readings from Last 3 Encounters:  06/20/21 81  05/30/21 84  05/03/21 77   Wt Readings from Last 3 Encounters:  06/20/21 166 lb 6.4 oz (75.5 kg)  05/03/21 159 lb 3.2 oz (72.2 kg)  04/13/21 149 lb 6.4 oz (67.8 kg)   BMI Readings from Last 3 Encounters:  06/20/21 30.43 kg/m  05/03/21 29.12 kg/m  04/13/21 27.50 kg/m    Assessment/Interventions: Review of patient past medical history, allergies, medications, health status, including review of consultants reports, laboratory and other test data, was performed as part of comprehensive evaluation and provision of chronic care management services.   SDOH Screenings   Alcohol Screen: Not on file  Depression (PHQ2-9): Low Risk    PHQ-2 Score: 0  Financial Resource Strain: Low Risk    Difficulty of Paying Living Expenses: Not hard at all  Food Insecurity: No Food Insecurity   Worried About Charity fundraiser in the Last Year: Never true   Ran Out of Food in the Last Year: Never true  Housing: Not on file  Physical Activity: Inactive   Days of Exercise per Week: 0 days   Minutes of Exercise per Session: 0 min  Social Connections: Not on file  Stress: No Stress Concern Present   Feeling of Stress : Not at all  Tobacco Use: Low Risk    Smoking Tobacco Use: Never   Smokeless Tobacco Use: Never   Passive Exposure: Not on file  Transportation Needs: No Transportation Needs   Lack of Transportation (Medical): No   Lack of Transportation (Non-Medical): No    CCM Care Plan  Allergies  Allergen Reactions    Pollen Extract Other (See Comments)    Congestion/sneezing   Tomato Hives and Itching   Codeine Hives, Itching and Nausea And Vomiting    Medications Reviewed Today     Reviewed by Mayford Knife, RPH (Pharmacist) on 08/10/21 at [place] List Status: <None>   Medication Order Taking? Sig Documenting Provider Last Dose Status Informant  amLODipine (NORVASC) 5  MG tablet [id number] Yes Take 1 tablet (5 mg total) by mouth at bedtime. [name], FNP Taking Active   aspirin EC 81 MG EC tablet [id number] Yes Take 1 tablet (81 mg total) by mouth daily. Swallow whole. Elmarie Shiley, MD Taking Active   blood glucose meter kit and supplies KIT [id number] Yes Dispense based on patient and insurance preference. Check blood sugars 4 times daily E11.69 [name], FNP Taking Active   Continuous Blood Gluc Receiver (FREESTYLE LIBRE READER) DEVI [id number]  1 each by Does not apply route See admin instructions. CGM device-Freestyle Elenor Legato [provider]  Active Self  Continuous Blood Gluc Sensor (FREESTYLE LIBRE [place]) [place] [id number]  1 each by Does not apply route See admin instructions. CGM 14-day sensors; send refills to [employer] (in [place]) [provider]  Active Self  Finerenone (KERENDIA) 10 MG TABS [id number] Yes Take 1 tablet by mouth daily. [name], FNP Taking Active   HUMALOG 100 UNIT/ML injection [id number] Yes Inject 0.1 mLs (10 Units total) into the skin 3 (three) times daily with meals. [name], FNP Taking Active   insulin degludec (TRESIBA FLEXTOUCH) 100 UNIT/ML FlexTouch Pen [id number] Yes Inject 25 Units into the skin daily. [name], FNP Taking Active   Insulin Pen Needle (BD PEN NEEDLE MICRO U/F) 32G X 6 MM MISC [id number]  DX:E11.65 USE TO CHECK BLOOD SUGARS THREE TIMES A Lynnell Dike, MD  Active Self  Lancets Misc. [place] [id number]  1 each by Does not apply route 4 (four) times daily -  with meals and at bedtime. [name], FNP   Active Self  linaclotide Rolan Lipa) 145 MCG CAPS capsule [id number] Yes Take 1 capsule (145 mcg total) by mouth daily before breakfast. [name], FNP Taking Active   Multiple Vitamin (MULTIVITAMIN WITH MINERALS) TABS tablet [id number] Yes Take 1 tablet by mouth daily. [provider] Taking Active Self  Multiple Vitamin (MULTIVITAMIN) tablet [id number] Yes Take 1 tablet by mouth daily. [provider] Taking Active   nystatin cream (MYCOSTATIN) [id number]  Apply to affected area 2 times daily Volney American, PA-C  Active   [facility] VERIO test strip [id number] Yes USE TO test blood sugar FOUR TIMES DAILY [name], FNP Taking Active   pantoprazole (PROTONIX) 40 MG tablet [id number] Yes Take 1 tablet (40 mg total) by mouth daily. [name], FNP Taking Active   pravastatin (PRAVACHOL) 40 MG tablet [id number] Yes TAKE ONE TABLET BY MOUTH EVERYDAY AT BEDTIME [name], FNP Taking Active   pregabalin (LYRICA) 50 MG capsule [id number] Yes Take 1 capsule (50 mg total) by mouth 3 (three) times daily. [name], FNP Taking Active   Vibegron [facility]) 75 MG TABS [id number] Yes Take 75 mg by mouth daily. [provider] Taking Active Self  Vitamin D, Ergocalciferol, (DRISDOL) 1.25 MG (50000 UNIT) CAPS capsule [id number] Yes TAKE ONE CAPSULE BY MOUTH ONCE WEEKLY ON Loura Pardon, FNP Taking Active             Patient Active Problem List   Diagnosis Date Noted   Abdominal bloating 07/03/2021   Chronic idiopathic constipation 07/03/2021   Colon cancer screening 07/03/2021   Diverticulosis of large intestine without perforation or abscess without bleeding 07/03/2021   Dysphagia 07/03/2021   Flatulence, eructation and gas pain 07/03/2021   Hiatal hernia 07/03/2021   Quadriceps weakness 02/06/2021   Syncope 11/29/2020   Mild aortic stenosis 11/29/2020   Irritable bowel syndrome with diarrhea 10/10/2020  Gastroesophageal reflux disease with  esophagitis 10/10/2020   Postlaminectomy syndrome, not elsewhere classified 07/03/2020   S/P lumbar laminectomy 03/30/2020   Localized swelling, mass and lump, multiple sites 03/27/2018   Type II diabetes mellitus with renal manifestations ([place]) 12/27/2017   HLD (hyperlipidemia) 12/27/2017   HTN (hypertension) 12/27/2017   CKD (chronic kidney disease), stage III (HCC) 12/27/2017   Cough variant asthma 12/01/2015   Osteoarthritis of left knee, primary localized 08/17/2014   Knee osteoarthritis 08/17/2014   Hypoglycemia 11/15/2013   Other and unspecified hyperlipidemia 08/06/2013   Type II diabetes mellitus, uncontrolled 07/20/2013   Varicose veins of lower extremities with other complications 26/94/8546   Fibromyalgia    GERD 05/18/2010   ABDOMINAL PAIN, GENERALIZED 05/02/2010   OBESITY 04/20/2010   BURSITIS, LEFT SHOULDER 03/09/2010   Lipoma of arm s/p excision 01/29/2014 01/10/2010   DEPRESSION 12/27/2009   BACK PAIN WITH RADICULOPATHY 12/27/2009   CYSTITIS, ACUTE 10/21/2009   MICROSCOPIC HEMATURIA 10/18/2009   KNEE PAIN, BILATERAL 10/18/2009   NEUROPATHY 09/08/2009   Asthma 09/08/2009   STRESS INCONTINENCE 09/08/2009   CHEST PAIN 07/19/2009   ESOPHAGEAL STRICTURE 08/27/2005    Immunization History  Administered Date(s) Administered   Fluad Quad(high Dose 65+) 05/03/2020, 05/03/2021   Influenza Whole 06/27/2009, 06/13/2010   Influenza, High Dose Seasonal PF 07/22/2019   Influenza,inj,Quad PF,6+ Mos 06/03/2017   Influenza-Unspecified 03/27/2013, 03/27/2014, 05/18/2015   PFIZER(Purple Top)SARS-COV-2 Vaccination 09/21/2019, 10/02/2019, 05/24/2020   Pneumococcal Conjugate-13 07/24/2017   Pneumococcal Polysaccharide-23 08/27/2006, 11/29/2009, 03/24/2019   Td 10/18/2009   Tdap 12/27/2017    Conditions to be addressed/monitored:  Hypertension and Diabetes  Care Plan : [place]  Updates made by Mayford Knife, [place] since 08/15/2021 12:00 AM     Problem:  HTN, DM II   Priority: High     Long-Range Goal: Disease Management   Recent Progress: On track  Note:   Current Barriers:  Unable to achieve control of diabetes  Does not maintain contact with provider office Does not contact provider office for questions/concerns  Pharmacist Clinical Goal(s):  Patient will achieve adherence to monitoring guidelines and medication adherence to achieve therapeutic efficacy through collaboration with PharmD and provider.   Interventions: 1:1 collaboration with [name], FNP regarding development and update of comprehensive plan of care as evidenced by provider attestation and co-signature Inter-disciplinary care team collaboration (see longitudinal plan of care) Comprehensive medication review performed; medication list updated in electronic medical record  Hypertension (BP goal <130/80) -Controlled -Current treatment: Amlodipine 85m tabet by mouth daily -Current home readings: 127/80  -Current dietary habits: salt, fried and fatty foods  -Current exercise habits: please see hypertension for more details -Denies hypotensive/hypertensive symptoms -Educated on BP goals and benefits of medications for prevention of heart attack, stroke and kidney damage; Importance of home blood pressure monitoring; Proper BP monitoring technique; -Counseled to monitor BP at home at least three times per week, document, and provide log at future appointments -Recommended to continue current medication  Diabetes (A1c goal <7%) -Uncontrolled -Current medications: Humalog 100 unit/ml - inject 0.1 mls in the skin three times per day with meals Tresiba 100 unit/ml - inject 25 units into the skin daily  Patients medication increased to 27 units daily.   -Medications previously tried: FIran10 mg   -Current home glucose readings fasting glucose: 159, 137, 102 post prandial glucose:151, 167, 153, 198, 153,121,119, 217 - after lunch 2 to 3 hours  -Denies  hypoglycemic/hyperglycemic symptoms -Current meal patterns: patient reports  that nothing has changed with her eating habits. She sometimes has fast food - [employer], [employer] or [employer] When she is cooking at home she has vegetable plates with broccoli, corn or green beans. Sometimes she will have meat but usually she eats fish  drinks: she has increased the amount of water she is drinking, she is cutting back on the juice and tea after speaking with the PCP team. She is going to try to drink a 4 ounce glass of juice  -Current exercise: she is exercising on her elliptical three times per week, and she walks twice per day. She is doing these activities from 30-45 minutes, she is also using stretch bands for her arms and legs.  -Educated on A1c and blood sugar goals; Complications of diabetes including kidney damage, retinal damage, and cardiovascular disease; -Counseled to check feet daily and get yearly eye exams -Counseled on diet and exercise extensively Recommended to continue current medication, collaborate with PCP team to start patient back on Ozempic.   Patient Goals/Self-Care Activities Patient will:  - take medications as prescribed as evidenced by patient report and record review  Follow Up Plan: The patient has been provided with contact information for the care management team and has been advised to call with any health related questions or concerns.       Medication Assistance: None required.  Patient affirms current coverage meets needs.  Compliance/Adherence/Medication fill history: Care Gaps: Opthamology Exam Shingrix Vaccine COVID-19 Vaccine  Star-Rating Drugs: Pravastatin 40 mg tablet   Patient's preferred pharmacy is:  [facility] - [place], [place] - [address] [place] [address] Phone: [phone] Fax: [phone]  [facility] [phone] - [place], [address] AT [address] [place] [address] Phone: [phone] Fax: [phone]  Uses pill box? Yes Pt endorses 95% compliance  We discussed: Benefits of medication synchronization, packaging and delivery as well as enhanced pharmacist oversight with [facility]. Patient decided to: Utilize [facility] pharmacy for medication synchronization, packaging and delivery  Care Plan and Follow Up Patient Decision:  Patient agrees to Care Plan and Follow-up.  Plan: The patient has been provided with contact information for the care management team and has been advised to call with any health related questions or concerns.   [name], CPP, PharmD Clinical Pharmacist Practitioner [facility] [phone]

## 2021-08-14 ENCOUNTER — Ambulatory Visit: Payer: Medicare Other | Admitting: Podiatry

## 2021-08-15 NOTE — Patient Instructions (Addendum)
Visit Information It was great speaking with you today!  Please let me know if you have any questions about our visit.   Goals Addressed             This Visit's Progress    Manage My Medicine       Timeframe:  Long-Range Goal Priority:  High Start Date:                             Expected End Date:                       Follow Up Date 10/13/2021  In Progress:    - call for medicine refill 2 or 3 days before it runs out - call if I am sick and can't take my medicine - keep a list of all the medicines I take; vitamins and herbals too - learn to read medicine labels - use a pillbox to sort medicine - use an alarm clock or phone to remind me to take my medicine    Why is this important?   These steps will help you keep on track with your medicines. Please call if you have any questions about your medications.            Patient Care Plan: CCM Pharmacy Care Plan     Problem Identified: HTN, DM II   Priority: High     Long-Range Goal: Disease Management   Recent Progress: On track  Note:   Current Barriers:  Unable to achieve control of diabetes  Does not maintain contact with provider office Does not contact provider office for questions/concerns  Pharmacist Clinical Goal(s):  Patient will achieve adherence to monitoring guidelines and medication adherence to achieve therapeutic efficacy through collaboration with PharmD and provider.   Interventions: 1:1 collaboration with [name], FNP regarding development and update of comprehensive plan of care as evidenced by provider attestation and co-signature Inter-disciplinary care team collaboration (see longitudinal plan of care) Comprehensive medication review performed; medication list updated in electronic medical record  Hypertension (BP goal <130/80) -Controlled -Current treatment: Amlodipine 5mg  tabet by mouth daily -Current home readings: 127/80  -Current dietary habits: salt, fried and fatty  foods  -Current exercise habits: please see hypertension for more details -Denies hypotensive/hypertensive symptoms -Educated on BP goals and benefits of medications for prevention of heart attack, stroke and kidney damage; Importance of home blood pressure monitoring; Proper BP monitoring technique; -Counseled to monitor BP at home at least three times per week, document, and provide log at future appointments -Recommended to continue current medication  Diabetes (A1c goal <7%) -Uncontrolled -Current medications: Humalog 100 unit/ml - inject 0.1 mls in the skin three times per day with meals Tresiba 100 unit/ml - inject 25 units into the skin daily  Patients medication increased to 27 units daily.   -Medications previously tried: [place] 10 mg   -Current home glucose readings fasting glucose: 159, 137, 102 post prandial glucose:151, 167, 153, 198, 153,121,119, 217 - after lunch 2 to 3 hours  -Denies hypoglycemic/hyperglycemic symptoms -Current meal patterns: patient reports that nothing has changed with her eating habits. She sometimes has fast food - [employer], [employer] or [employer] When she is cooking at home she has vegetable plates with broccoli, corn or green beans. Sometimes she will have meat but usually she eats fish  drinks: she has increased the amount of water she is drinking, she is cutting  back on the juice and tea after speaking with the PCP team. She is going to try to drink a 4 ounce glass of juice  -Current exercise: she is exercising on her elliptical three times per week, and she walks twice per day. She is doing these activities from 30-45 minutes, she is also using stretch bands for her arms and legs.  -Educated on A1c and blood sugar goals; Complications of diabetes including kidney damage, retinal damage, and cardiovascular disease; -Counseled to check feet daily and get yearly eye exams -Counseled on diet and exercise extensively Recommended to continue current  medication, collaborate with PCP team to start patient on Ozempic.   Patient Goals/Self-Care Activities Patient will:  - take medications as prescribed as evidenced by patient report and record review  Follow Up Plan: The patient has been provided with contact information for the care management team and has been advised to call with any health related questions or concerns.        Patient agreed to services and verbal consent obtained.   The patient verbalized understanding of instructions, educational materials, and care plan provided today and agreed to receive a mailed copy of patient instructions, educational materials, and care plan.   [name], PharmD Clinical Pharmacist [facility] [phone]

## 2021-08-16 ENCOUNTER — Other Ambulatory Visit: Payer: Self-pay | Admitting: Nurse Practitioner

## 2021-08-16 DIAGNOSIS — E1165 Type 2 diabetes mellitus with hyperglycemia: Secondary | ICD-10-CM

## 2021-08-16 MED ORDER — OZEMPIC (0.25 OR 0.5 MG/DOSE) 2 MG/1.5ML ~~LOC~~ SOPN: 0.5000 mg | PEN_INJECTOR | SUBCUTANEOUS | 1 refills | Status: DC

## 2021-08-17 DIAGNOSIS — E118 Type 2 diabetes mellitus with unspecified complications: Secondary | ICD-10-CM | POA: Diagnosis not present

## 2021-08-17 DIAGNOSIS — Z794 Long term (current) use of insulin: Secondary | ICD-10-CM | POA: Diagnosis not present

## 2021-08-22 ENCOUNTER — Telehealth: Payer: Self-pay

## 2021-08-22 ENCOUNTER — Encounter: Payer: Self-pay | Admitting: Nurse Practitioner

## 2021-08-22 NOTE — Chronic Care Management (AMB) (Addendum)
Chronic Care Management Pharmacy Assistant   Name: [name]  MRN: [id number] DOB: 02/26/1951  Reason for Encounter: Medication Review/Medication Coordination  Recent office visits:  None  Recent consult visits:  None  Hospital visits:  Medication Reconciliation was completed by comparing discharge summary, patients EMR and Pharmacy list, and upon discussion with patient.   Admitted to the hospital on 05-30-2021 due to groin swelling. Discharge date was 05-30-2021 Discharged from [place] [facility] [place]?Medications Started at [facility] Discharge:?? Keflex 500 mg twice daily for 5 days Nystatin cream twice daily   Medication Changes at Hospital Discharge: None   Medications Discontinued at Hospital Discharge: None   Medications that remain the same after Hospital Discharge:??  -All other medications will remain the same.      Medications: Outpatient Encounter Medications as of 08/22/2021  Medication Sig   amLODipine (NORVASC) 5 MG tablet Take 1 tablet (5 mg total) by mouth at bedtime.   aspirin EC 81 MG EC tablet Take 1 tablet (81 mg total) by mouth daily. Swallow whole.   blood glucose meter kit and supplies KIT Dispense based on patient and insurance preference. Check blood sugars 4 times daily E11.69   Continuous Blood Gluc Receiver (FREESTYLE LIBRE READER) DEVI 1 each by Does not apply route See admin instructions. CGM device-Freestyle Libre   Continuous Blood Gluc Sensor (FREESTYLE LIBRE 14 DAY SENSOR) MISC 1 each by Does not apply route See admin instructions. CGM 14-day sensors; send refills to [employer] (in Epic)   Finerenone (KERENDIA) 10 MG TABS Take 1 tablet by mouth daily.   HUMALOG 100 UNIT/ML injection Inject 0.1 mLs (10 Units total) into the skin 3 (three) times daily with meals.   insulin degludec (TRESIBA FLEXTOUCH) 100 UNIT/ML FlexTouch Pen Inject 25 Units into the skin daily.   Insulin Pen Needle (BD PEN NEEDLE MICRO  U/F) 32G X 6 MM MISC DX:E11.65 USE TO CHECK BLOOD SUGARS THREE TIMES A DAY   Lancets Misc. MISC 1 each by Does not apply route 4 (four) times daily -  with meals and at bedtime.   linaclotide (LINZESS) 145 MCG CAPS capsule Take 1 capsule (145 mcg total) by mouth daily before breakfast.   Multiple Vitamin (MULTIVITAMIN WITH MINERALS) TABS tablet Take 1 tablet by mouth daily.   Multiple Vitamin (MULTIVITAMIN) tablet Take 1 tablet by mouth daily.   nystatin cream (MYCOSTATIN) Apply to affected area 2 times daily   ONETOUCH VERIO test strip USE TO test blood sugar FOUR TIMES DAILY   pantoprazole (PROTONIX) 40 MG tablet Take 1 tablet (40 mg total) by mouth daily.   pravastatin (PRAVACHOL) 40 MG tablet TAKE ONE TABLET BY MOUTH EVERYDAY AT BEDTIME   pregabalin (LYRICA) 50 MG capsule Take 1 capsule (50 mg total) by mouth 3 (three) times daily.   Semaglutide,0.25 or 0.5MG/DOS, (OZEMPIC, 0.25 OR 0.5 MG/DOSE,) 2 MG/1.5ML SOPN Inject 0.5 mg into the skin once a week.   Vibegron (GEMTESA) 75 MG TABS Take 75 mg by mouth daily.   Vitamin D, Ergocalciferol, (DRISDOL) 1.25 MG (50000 UNIT) CAPS capsule TAKE ONE CAPSULE BY MOUTH ONCE WEEKLY ON WEDNESDAY   No facility-administered encounter medications on file as of 08/22/2021.   Reviewed chart for medication changes ahead of medication coordination call.  No OVs, Consults, or hospital visits since last care coordination call/Pharmacist visit. (If appropriate, list visit date, provider name)  No medication changes indicated OR if recent visit, treatment plan here.  BP Readings from Last 3 Encounters:  06/20/21 130/82  05/30/21 (!) 168/97  05/03/21 140/72    Lab Results  Component Value Date   HGBA1C 11.2 (H) 06/20/2021     Patient obtains medications through Adherence Packaging  30 Days   Last adherence delivery included:  Kerendia 10 mg daily at breakfast Tresiba 25 units daily  Pravastatin 40 mg- 1 tablet daily (bedtime) Women's 50 Plus  Multivitamin- 1 tablet daily (breakfast) Amlodipine 5 mg- 1 tablet daily (bedtime) Pantoprazole 40 mg- 1 tablet daily (breakfast) Ergocalciferol 1250 mcg- 1 capsule weekly on Wednesdays (breakfast) Linzess 145 mcg- 1 tablet daily(before breakfast) Lyrica 50 mg 3 times daily- 1 before breakfast, 1 with evening meal and 1 at bedtime.   Patient declined (meds) last month: None  Patient is due for next adherence delivery on: 08-31-2020  Called patient and reviewed medications and coordinated delivery.  This delivery to include: Kerendia 10 mg daily at breakfast Tresiba 25 units daily (Increased to 27 units) Pravastatin 40 mg- 1 tablet daily (bedtime) Women's 50 Plus Multivitamin- 1 tablet daily (breakfast) Amlodipine 5 mg- 1 tablet daily (bedtime) Pantoprazole 40 mg- 1 tablet daily (breakfast) Ergocalciferol 1250 mcg- 1 capsule weekly on Wednesdays (breakfast) Linzess 145 mcg- 1 tablet daily(before breakfast) Lyrica 50 mg 3 times daily- - 1 before breakfast, 1 with evening meal and 1 at bedtime.   No acute/short fill needed Patient declined the following medications: None  Patient needs refills for: Carrington Clamp- Sent request Tresiba 27 units (New script)- Sent request  Confirmed delivery date of 08-31-2020 advised patient that pharmacy will contact them the morning of delivery.  08-25-2021: [facility] requested a new script for tresiba 27 units.  Care Gaps: Ophthalmology exam overdue Shingrix overdue Covid booster overdue AWV 01-24-2022  Star Rating Drugs: Pravastatin 40 mg- Last filled 07-26-2021 30 [facility]  [place] Clinical Pharmacist Assistant [phone]

## 2021-08-23 ENCOUNTER — Other Ambulatory Visit: Payer: Self-pay

## 2021-08-23 DIAGNOSIS — E1165 Type 2 diabetes mellitus with hyperglycemia: Secondary | ICD-10-CM

## 2021-08-23 MED ORDER — KERENDIA 10 MG PO TABS: 1.0000 | ORAL_TABLET | Freq: Every day | ORAL | 2 refills | Status: DC

## 2021-08-26 DIAGNOSIS — E1165 Type 2 diabetes mellitus with hyperglycemia: Secondary | ICD-10-CM

## 2021-08-26 DIAGNOSIS — I10 Essential (primary) hypertension: Secondary | ICD-10-CM | POA: Diagnosis not present

## 2021-08-28 ENCOUNTER — Other Ambulatory Visit: Payer: Self-pay

## 2021-08-28 DIAGNOSIS — E1165 Type 2 diabetes mellitus with hyperglycemia: Secondary | ICD-10-CM

## 2021-08-28 MED ORDER — TRESIBA FLEXTOUCH 100 UNIT/ML ~~LOC~~ SOPN: 27.0000 [IU] | PEN_INJECTOR | Freq: Every day | SUBCUTANEOUS | 1 refills | Status: DC

## 2021-08-30 ENCOUNTER — Encounter: Payer: Self-pay | Admitting: Nurse Practitioner

## 2021-08-30 ENCOUNTER — Telehealth: Payer: Self-pay

## 2021-08-30 DIAGNOSIS — N3020 Other chronic cystitis without hematuria: Secondary | ICD-10-CM | POA: Diagnosis not present

## 2021-08-30 NOTE — Chronic Care Management (AMB) (Signed)
Chronic Care Management Pharmacy Assistant   Name: [name]  MRN: [id number] DOB: 11/15/1950  Reason for Encounter: Disease State/ Diabetes  Recent office visits:  None  Recent consult visits:  None  Hospital visits:  Medication Reconciliation was completed by comparing discharge summary, patients EMR and Pharmacy list, and upon discussion with patient.   Admitted to the hospital on 05-30-2021 due to groin swelling. Discharge date was 05-30-2021 Discharged from [place] [facility] [place]?Medications Started at [facility] Discharge:?? Keflex 500 mg twice daily for 5 days Nystatin cream twice daily   Medication Changes at Hospital Discharge: None   Medications Discontinued at Hospital Discharge: None   Medications that remain the same after Hospital Discharge:??  -All other medications will remain the same.      Medications: Outpatient Encounter Medications as of 08/30/2021  Medication Sig   amLODipine (NORVASC) 5 MG tablet Take 1 tablet (5 mg total) by mouth at bedtime.   aspirin EC 81 MG EC tablet Take 1 tablet (81 mg total) by mouth daily. Swallow whole.   blood glucose meter kit and supplies KIT Dispense based on patient and insurance preference. Check blood sugars 4 times daily E11.69   Continuous Blood Gluc Receiver (FREESTYLE LIBRE READER) DEVI 1 each by Does not apply route See admin instructions. CGM device-Freestyle Libre   Continuous Blood Gluc Sensor (FREESTYLE LIBRE 14 DAY SENSOR) MISC 1 each by Does not apply route See admin instructions. CGM 14-day sensors; send refills to [employer] (in Epic)   Finerenone (KERENDIA) 10 MG TABS Take 1 tablet by mouth daily.   HUMALOG 100 UNIT/ML injection Inject 0.1 mLs (10 Units total) into the skin 3 (three) times daily with meals.   insulin degludec (TRESIBA FLEXTOUCH) 100 UNIT/ML FlexTouch Pen Inject 27 Units into the skin daily.   Insulin Pen Needle (BD PEN NEEDLE MICRO U/F) 32G X 6 MM  MISC DX:E11.65 USE TO CHECK BLOOD SUGARS THREE TIMES A DAY   Lancets Misc. MISC 1 each by Does not apply route 4 (four) times daily -  with meals and at bedtime.   linaclotide (LINZESS) 145 MCG CAPS capsule Take 1 capsule (145 mcg total) by mouth daily before breakfast.   Multiple Vitamin (MULTIVITAMIN WITH MINERALS) TABS tablet Take 1 tablet by mouth daily.   Multiple Vitamin (MULTIVITAMIN) tablet Take 1 tablet by mouth daily.   nystatin cream (MYCOSTATIN) Apply to affected area 2 times daily   ONETOUCH VERIO test strip USE TO test blood sugar FOUR TIMES DAILY   pantoprazole (PROTONIX) 40 MG tablet Take 1 tablet (40 mg total) by mouth daily.   pravastatin (PRAVACHOL) 40 MG tablet TAKE ONE TABLET BY MOUTH EVERYDAY AT BEDTIME   pregabalin (LYRICA) 50 MG capsule Take 1 capsule (50 mg total) by mouth 3 (three) times daily.   Semaglutide,0.25 or 0.5MG/DOS, (OZEMPIC, 0.25 OR 0.5 MG/DOSE,) 2 MG/1.5ML SOPN Inject 0.5 mg into the skin once a week.   Vibegron (GEMTESA) 75 MG TABS Take 75 mg by mouth daily.   Vitamin D, Ergocalciferol, (DRISDOL) 1.25 MG (50000 UNIT) CAPS capsule TAKE ONE CAPSULE BY MOUTH ONCE WEEKLY ON WEDNESDAY   No facility-administered encounter medications on file as of 08/30/2021.   Recent Relevant Labs: Lab Results  Component Value Date/Time   HGBA1C 11.2 (H) 06/20/2021 10:49 AM   HGBA1C 11.8 (H) 04/13/2021 03:04 PM   MICROALBUR 80 12/13/2020 05:01 PM   MICROALBUR 80 12/31/2019 03:19 PM    Kidney  Function Lab Results  Component Value Date/Time   CREATININE 1.19 (H) 04/13/2021 03:04 PM   CREATININE 1.22 (H) 12/13/2020 03:32 PM   GFR 51.07 (L) 05/17/2017 08:54 AM   GFRNONAA 59 (L) 12/01/2020 08:59 AM   GFRAA 57 (L) 07/07/2020 05:11 PM    Current antihyperglycemic regimen:  Humalog 100 unit/ml - inject 0.1 mls in the skin three times per day with meals. Tresiba 100 unit/ml- 27 units daily.  What recent interventions/DTPs have been made to improve glycemic control:   Educated on A1c and blood sugar goals; Complications of diabetes including kidney damage, retinal damage, and cardiovascular disease; -Counseled to check feet daily and get yearly eye exams -Counseled on diet and exercise extensively Recommended to continue current medication, collaborate with PCP team to start patient back on Ozempic.   Have there been any recent hospitalizations or ED visits since last visit with CPP? No  Patient denies hypoglycemic symptoms  Patient denies hyperglycemic symptoms  How often are you checking your blood sugar? 3-4 times daily  What are your blood sugars ranging?  Fasting: 12-30 127, 01-01 164 Before meals: 12-30 187, 01-02 153, 01-03 127 After meals: 12-30 201, 01-02 147, 01-03 173 Bedtime: 12-30 253, 01-02 214,  01-03 167  During the week, how often does your blood glucose drop below 70? Never  Are you checking your feet daily/regularly? Patient states daily  Adherence Review: Is the patient currently on a STATIN medication? Yes Is the patient currently on ACE/ARB medication? No Does the patient have >5 day gap between last estimated fill dates? No  Care Gaps: Yearly Ophthalmology exam overdue Shingrix overdue Covid Booster overdue AWV 01-24-2022  Star Rating Drugs: Pravastatin 40 mg- Last filled 08-28-2021  [place] Pharmacist Assistant [phone]

## 2021-08-31 ENCOUNTER — Telehealth: Payer: Self-pay

## 2021-08-31 NOTE — Chronic Care Management (AMB) (Addendum)
° ° °  Chronic Care Management Pharmacy Assistant   Name: BRIE EPPARD  MRN: [id number] DOB: September 12, 1950   Reason for Encounter: Medication Review/ Medication coordination follow up on Ozempic  Medications: Outpatient Encounter Medications as of 08/31/2021  Medication Sig   amLODipine (NORVASC) 5 MG tablet Take 1 tablet (5 mg total) by mouth at bedtime.   aspirin EC 81 MG EC tablet Take 1 tablet (81 mg total) by mouth daily. Swallow whole.   blood glucose meter kit and supplies KIT Dispense based on patient and insurance preference. Check blood sugars 4 times daily E11.69   Continuous Blood Gluc Receiver (FREESTYLE LIBRE READER) DEVI 1 each by Does not apply route See admin instructions. CGM device-Freestyle Libre   Continuous Blood Gluc Sensor (FREESTYLE LIBRE 14 DAY SENSOR) MISC 1 each by Does not apply route See admin instructions. CGM 14-day sensors; send refills to [employer] (in Epic)   Finerenone (KERENDIA) 10 MG TABS Take 1 tablet by mouth daily.   HUMALOG 100 UNIT/ML injection Inject 0.1 mLs (10 Units total) into the skin 3 (three) times daily with meals.   insulin degludec (TRESIBA FLEXTOUCH) 100 UNIT/ML FlexTouch Pen Inject 27 Units into the skin daily.   Insulin Pen Needle (BD PEN NEEDLE MICRO U/F) 32G X 6 MM MISC DX:E11.65 USE TO CHECK BLOOD SUGARS THREE TIMES A DAY   Lancets Misc. MISC 1 each by Does not apply route 4 (four) times daily -  with meals and at bedtime.   linaclotide (LINZESS) 145 MCG CAPS capsule Take 1 capsule (145 mcg total) by mouth daily before breakfast.   Multiple Vitamin (MULTIVITAMIN WITH MINERALS) TABS tablet Take 1 tablet by mouth daily.   Multiple Vitamin (MULTIVITAMIN) tablet Take 1 tablet by mouth daily.   nystatin cream (MYCOSTATIN) Apply to affected area 2 times daily   ONETOUCH VERIO test strip USE TO test blood sugar FOUR TIMES DAILY   pantoprazole (PROTONIX) 40 MG tablet Take 1 tablet (40 mg total) by mouth daily.   pravastatin  (PRAVACHOL) 40 MG tablet TAKE ONE TABLET BY MOUTH EVERYDAY AT BEDTIME   pregabalin (LYRICA) 50 MG capsule Take 1 capsule (50 mg total) by mouth 3 (three) times daily.   Semaglutide,0.25 or 0.5MG/DOS, (OZEMPIC, 0.25 OR 0.5 MG/DOSE,) 2 MG/1.5ML SOPN Inject 0.5 mg into the skin once a week.   Vibegron (GEMTESA) 75 MG TABS Take 75 mg by mouth daily.   Vitamin D, Ergocalciferol, (DRISDOL) 1.25 MG (50000 UNIT) CAPS capsule TAKE ONE CAPSULE BY MOUTH ONCE WEEKLY ON WEDNESDAY   No facility-administered encounter medications on file as of 08/31/2021.   08-31-2021: Patient called and stated she didn't receive her Ozempic with recent delivery. Chasity at [facility] informed [name] CPP that [facility]. [employer] and medication is in stock. Sent message to [name], [name] and [place] CMA to send prescription there today.  09-04-2021: Ozempic was sent to [facility]. Called pharmacy to give patient's insurance information. Spoke with pharmacist Romero Liner for insurance information. Pharmacist stated medication was approved with no copay and will be delivered on 09-05-2021 between 1pm-5pm. Confirmed patient's address and phone number. Updated patient.  [place] Pharmacist Assistant [phone]

## 2021-09-01 ENCOUNTER — Telehealth: Payer: Self-pay

## 2021-09-01 ENCOUNTER — Telehealth: Payer: Medicare Other

## 2021-09-01 NOTE — Telephone Encounter (Signed)
°  Care Management   Follow Up Note   09/01/2021 Name: [name] MRN: [id number] DOB: Jan 29, 1951   Referred by: [name], FNP Reason for referral : Chronic Care Management (Inbound call from patient )  Received voice message from patient requesting a return call.  An unsuccessful telephone outreach was attempted today. The patient was referred to the case management team for assistance with care management and care coordination.   Follow Up Plan: A HIPPA compliant phone message was left for the patient providing contact information and requesting a return call.   [name], RN, BSN, CCM Care Management Coordinator [employer] [facility]  Direct Phone: [phone]

## 2021-09-02 LAB — HM HEPATITIS C SCREENING LAB: HM Hepatitis Screen: NEGATIVE

## 2021-09-04 ENCOUNTER — Other Ambulatory Visit: Payer: Self-pay

## 2021-09-04 DIAGNOSIS — E1165 Type 2 diabetes mellitus with hyperglycemia: Secondary | ICD-10-CM

## 2021-09-04 MED ORDER — OZEMPIC (0.25 OR 0.5 MG/DOSE) 2 MG/1.5ML ~~LOC~~ SOPN: 0.5000 mg | PEN_INJECTOR | SUBCUTANEOUS | 1 refills | Status: DC

## 2021-09-06 ENCOUNTER — Ambulatory Visit: Payer: Medicare Other | Admitting: Student

## 2021-09-07 ENCOUNTER — Encounter: Payer: Self-pay | Admitting: Nurse Practitioner

## 2021-09-15 ENCOUNTER — Encounter: Payer: Self-pay | Admitting: Nurse Practitioner

## 2021-09-17 DIAGNOSIS — E118 Type 2 diabetes mellitus with unspecified complications: Secondary | ICD-10-CM | POA: Diagnosis not present

## 2021-09-17 DIAGNOSIS — Z794 Long term (current) use of insulin: Secondary | ICD-10-CM | POA: Diagnosis not present

## 2021-09-19 ENCOUNTER — Encounter: Payer: Self-pay | Admitting: Nurse Practitioner

## 2021-09-20 ENCOUNTER — Telehealth: Payer: Self-pay

## 2021-09-20 NOTE — Chronic Care Management (AMB) (Signed)
Chronic Care Management Pharmacy Assistant   Name: [name]  MRN: [id number] DOB: 01-Jul-1951  Reason for Encounter: Medication Review/ Medication Coordination.  Recent office visits:  None  Recent consult visits:  None  Hospital visits:  Medication Reconciliation was completed by comparing discharge summary, patients EMR and Pharmacy list, and upon discussion with patient.   Admitted to the hospital on 05-30-2021 due to groin swelling. Discharge date was 05-30-2021 Discharged from [facility] [place]?Medications Started at [facility] Discharge:?? Keflex 500 mg twice daily for 5 days Nystatin cream twice daily   Medication Changes at Hospital Discharge: None   Medications Discontinued at Hospital Discharge: None   Medications that remain the same after Hospital Discharge:??  -All other medications will remain the same.     Medications: Outpatient Encounter Medications as of 09/20/2021  Medication Sig   amLODipine (NORVASC) 5 MG tablet Take 1 tablet (5 mg total) by mouth at bedtime.   aspirin EC 81 MG EC tablet Take 1 tablet (81 mg total) by mouth daily. Swallow whole.   blood glucose meter kit and supplies KIT Dispense based on patient and insurance preference. Check blood sugars 4 times daily E11.69   Continuous Blood Gluc Receiver (FREESTYLE LIBRE READER) DEVI 1 each by Does not apply route See admin instructions. CGM device-Freestyle Libre   Continuous Blood Gluc Sensor (FREESTYLE LIBRE 14 DAY SENSOR) MISC 1 each by Does not apply route See admin instructions. CGM 14-day sensors; send refills to [employer] (in Epic)   Finerenone (KERENDIA) 10 MG TABS Take 1 tablet by mouth daily.   HUMALOG 100 UNIT/ML injection Inject 0.1 mLs (10 Units total) into the skin 3 (three) times daily with meals.   insulin degludec (TRESIBA FLEXTOUCH) 100 UNIT/ML FlexTouch Pen Inject 27 Units into the skin daily.   Insulin Pen Needle (BD PEN NEEDLE MICRO  U/F) 32G X 6 MM MISC DX:E11.65 USE TO CHECK BLOOD SUGARS THREE TIMES A DAY   Lancets Misc. MISC 1 each by Does not apply route 4 (four) times daily -  with meals and at bedtime.   linaclotide (LINZESS) 145 MCG CAPS capsule Take 1 capsule (145 mcg total) by mouth daily before breakfast.   Multiple Vitamin (MULTIVITAMIN WITH MINERALS) TABS tablet Take 1 tablet by mouth daily.   Multiple Vitamin (MULTIVITAMIN) tablet Take 1 tablet by mouth daily.   nystatin cream (MYCOSTATIN) Apply to affected area 2 times daily   ONETOUCH VERIO test strip USE TO test blood sugar FOUR TIMES DAILY   pantoprazole (PROTONIX) 40 MG tablet Take 1 tablet (40 mg total) by mouth daily.   pravastatin (PRAVACHOL) 40 MG tablet TAKE ONE TABLET BY MOUTH EVERYDAY AT BEDTIME   pregabalin (LYRICA) 50 MG capsule Take 1 capsule (50 mg total) by mouth 3 (three) times daily.   Semaglutide,0.25 or 0.5MG/DOS, (OZEMPIC, 0.25 OR 0.5 MG/DOSE,) 2 MG/1.5ML SOPN Inject 0.5 mg into the skin once a week.   Vibegron (GEMTESA) 75 MG TABS Take 75 mg by mouth daily.   Vitamin D, Ergocalciferol, (DRISDOL) 1.25 MG (50000 UNIT) CAPS capsule TAKE ONE CAPSULE BY MOUTH ONCE WEEKLY ON WEDNESDAY   No facility-administered encounter medications on file as of 09/20/2021.  Reviewed chart for medication changes ahead of medication coordination call.  No OVs, Consults, or hospital visits since last care coordination call/Pharmacist visit.  No medication changes indicated OR if recent visit, treatment plan here.  BP Readings from Last 3 Encounters:  06/20/21  130/82  05/30/21 (!) 168/97  05/03/21 140/72    Lab Results  Component Value Date   HGBA1C 11.2 (H) 06/20/2021     Patient obtains medications through Adherence Packaging  30 Days   Last adherence delivery included:  Kerendia 10 mg daily at breakfast Tresiba 25 units daily (Increased to 27 units) Pravastatin 40 mg- 1 tablet daily (bedtime) Women's 50 Plus Multivitamin- 1 tablet daily  (breakfast) Amlodipine 5 mg- 1 tablet daily (bedtime) Pantoprazole 40 mg- 1 tablet daily (breakfast) Ergocalciferol 1250 mcg- 1 capsule weekly on Wednesdays (breakfast) Linzess 145 mcg- 1 tablet daily(before breakfast) Lyrica 50 mg 3 times daily- - 1 before breakfast, 1 with evening meal and 1 at bedtime.   Patient declined (meds) last month: None  Patient is due for next adherence delivery on: 10-02-2021  Called patient and reviewed medications and coordinated delivery.  This delivery to include: Tresiba 25 units daily (Increased to 27 units) Pravastatin 40 mg 1 tablet daily (bedtime) Kerendia 10 mg daily at (breakfast) Women's 50 Plus Multivitamin 1 tablet daily (breakfast) Lyrica 50 mg 3 times daily 1 before breakfast, 1 with evening meal and 1 at (bedtime).  Amlodipine 5 mg 1 tablet daily (bedtime) Pantoprazole 40 mg 1 tablet daily (breakfast) Ergocalciferol 1250 mcg 1 capsule weekly on Wednesdays (breakfast) Linzess 145 mcg 1 tablet daily(before breakfast)  No short/acute fill needed  Patient declined the following medications (meds): None  Patient needs refills for: None  Confirmed delivery date of 10-02-2021 advised patient that pharmacy will contact them the morning of delivery.  Care Gaps: Yearly Ophthalmology exam overdue Shingrix overdue Covid Booster overdue AWV 01-24-2022  Star Rating Drugs: Pravastatin 40 mg- Last filled 08-28-2021 30 [facility]  [place] Clinical Pharmacist Assistant [phone]

## 2021-09-26 ENCOUNTER — Other Ambulatory Visit: Payer: Self-pay

## 2021-09-26 ENCOUNTER — Ambulatory Visit (INDEPENDENT_AMBULATORY_CARE_PROVIDER_SITE_OTHER): Payer: Medicare Other | Admitting: Nurse Practitioner

## 2021-09-26 ENCOUNTER — Encounter: Payer: Self-pay | Admitting: Nurse Practitioner

## 2021-09-26 VITALS — BP 142/98 | HR 98 | Temp 98.1°F | Ht 62.0 in | Wt 173.6 lb

## 2021-09-26 DIAGNOSIS — Z23 Encounter for immunization: Secondary | ICD-10-CM

## 2021-09-26 DIAGNOSIS — E559 Vitamin D deficiency, unspecified: Secondary | ICD-10-CM

## 2021-09-26 DIAGNOSIS — N1831 Chronic kidney disease, stage 3a: Secondary | ICD-10-CM

## 2021-09-26 DIAGNOSIS — Z794 Long term (current) use of insulin: Secondary | ICD-10-CM

## 2021-09-26 DIAGNOSIS — E1122 Type 2 diabetes mellitus with diabetic chronic kidney disease: Secondary | ICD-10-CM | POA: Diagnosis not present

## 2021-09-26 DIAGNOSIS — E1165 Type 2 diabetes mellitus with hyperglycemia: Secondary | ICD-10-CM | POA: Diagnosis not present

## 2021-09-26 DIAGNOSIS — R69 Illness, unspecified: Secondary | ICD-10-CM | POA: Diagnosis not present

## 2021-09-26 DIAGNOSIS — I1310 Hypertensive heart and chronic kidney disease without heart failure, with stage 1 through stage 4 chronic kidney disease, or unspecified chronic kidney disease: Secondary | ICD-10-CM

## 2021-09-26 DIAGNOSIS — I10 Essential (primary) hypertension: Secondary | ICD-10-CM

## 2021-09-26 DIAGNOSIS — E6609 Other obesity due to excess calories: Secondary | ICD-10-CM

## 2021-09-26 DIAGNOSIS — Z6831 Body mass index (BMI) 31.0-31.9, adult: Secondary | ICD-10-CM

## 2021-09-26 DIAGNOSIS — E782 Mixed hyperlipidemia: Secondary | ICD-10-CM

## 2021-09-26 MED ORDER — KERENDIA 10 MG PO TABS: 1.0000 | ORAL_TABLET | Freq: Every day | ORAL | 2 refills | Status: DC

## 2021-09-26 MED ORDER — SHINGRIX 50 MCG/0.5ML IM SUSR: 0.5000 mL | Freq: Once | INTRAMUSCULAR | 0 refills | Status: AC

## 2021-09-26 NOTE — Progress Notes (Signed)
I,Victoria T Hamilton,acting as a Education administrator for [name], FNP.,have documented all relevant documentation on the behalf of [name], FNP,as directed by  [name], FNP while in the presence of [name], [place].  This visit occurred during the SARS-CoV-2 public health emergency.  Safety protocols were in place, including screening questions prior to the visit, additional usage of staff PPE, and extensive cleaning of exam room while observing appropriate contact time as indicated for disinfecting solutions.  Subjective:     Patient ID: [name] , female    DOB: [birth year] , 71 y.o.   MRN: [id number]   Chief Complaint  Patient presents with   Diabetes    HPI  Pt presents today for DM & HTN f/u. She is now on Ozempic for the last 2-3 weeks. Tolerating well.  She reports having diabetic eye exam done tomorrow. She would like liquid potassium due to having difficulty with swallowing    Past Medical History:  Diagnosis Date   Aortic stenosis    mild on 04/07/19 echo   Arthritis    L knee, hands, back    Asthma    Diabetes mellitus type 2, insulin dependent (HCC)    Fibromyalgia    Full dentures    Full dentures    GERD (gastroesophageal reflux disease)    H/O hiatal hernia    Headache    h/o migraines - was followed for a time with a wellness doctor   History of esophageal dilatation    History of rhabdomyolysis    03/ 2015   Hyperlipidemia    Hypertension    Insulin pump in place    since 12/ 2014--  [employer]   Lumbar disc herniation    right leg weakness   Osteoarthritis of left knee, primary localized 08/17/2014   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   Renal insufficiency    Rhabdomyolysis 11/15/2013   SUI (stress urinary incontinence, female)    Syncope 12/27/2017   Wears glasses    Wears glasses      Family History  Problem Relation Age of Onset   Diabetes Mother    Hypertension Mother    Lung cancer Mother        smoked   Diabetes Father     Hypertension Father    Lung cancer Father        smoked   Diabetes Brother    Heart failure Sister    Diabetes Maternal Grandmother    Cancer Maternal Grandmother      Current Outpatient Medications:    amLODipine (NORVASC) 5 MG tablet, Take 1 tablet (5 mg total) by mouth at bedtime., Disp: 90 tablet, Rfl: 1   aspirin EC 81 MG EC tablet, Take 1 tablet (81 mg total) by mouth daily. Swallow whole., Disp: 30 tablet, Rfl: 11   blood glucose meter kit and supplies KIT, Dispense based on patient and insurance preference. Check blood sugars 4 times daily E11.69, Disp: 1 each, Rfl: 1   Continuous Blood Gluc Receiver (FREESTYLE LIBRE READER) DEVI, 1 each by Does not apply route See admin instructions. CGM device-Freestyle Libre, Disp: , Rfl:    Continuous Blood Gluc Sensor (FREESTYLE LIBRE 14 DAY SENSOR) MISC, 1 each by Does not apply route See admin instructions. CGM 14-day sensors; send refills to [employer] (in Epic), Disp: , Rfl:    HUMALOG 100 UNIT/ML injection, Inject 0.1 mLs (10 Units total) into the skin 3 (three) times daily with meals., Disp: 10 mL, Rfl: 3  insulin degludec (TRESIBA FLEXTOUCH) 100 UNIT/ML FlexTouch Pen, Inject 27 Units into the skin daily., Disp: 9 mL, Rfl: 1   Insulin Pen Needle (BD PEN NEEDLE MICRO U/F) 32G X 6 MM MISC, DX:E11.65 USE TO CHECK BLOOD SUGARS THREE TIMES A DAY, Disp: 100 each, Rfl: 6   Lancets Misc. MISC, 1 each by Does not apply route 4 (four) times daily -  with meals and at bedtime., Disp: 300 each, Rfl: 3   linaclotide (LINZESS) 145 MCG CAPS capsule, Take 1 capsule (145 mcg total) by mouth daily before breakfast., Disp: 90 capsule, Rfl: 1   Multiple Vitamin (MULTIVITAMIN WITH MINERALS) TABS tablet, Take 1 tablet by mouth daily., Disp: , Rfl:    Multiple Vitamin (MULTIVITAMIN) tablet, Take 1 tablet by mouth daily., Disp: , Rfl:    nystatin cream (MYCOSTATIN), Apply to affected area 2 times daily, Disp: 80 g, Rfl: 2   ONETOUCH VERIO test strip, USE TO  test blood sugar FOUR TIMES DAILY, Disp: 100 strip, Rfl: 1   pantoprazole (PROTONIX) 40 MG tablet, Take 1 tablet (40 mg total) by mouth daily., Disp: 90 tablet, Rfl: 1   pravastatin (PRAVACHOL) 40 MG tablet, TAKE ONE TABLET BY MOUTH EVERYDAY AT BEDTIME, Disp: 90 tablet, Rfl: 2   pregabalin (LYRICA) 50 MG capsule, Take 1 capsule (50 mg total) by mouth 3 (three) times daily., Disp: 90 capsule, Rfl: 2   Semaglutide,0.25 or 0.5MG/DOS, (OZEMPIC, 0.25 OR 0.5 MG/DOSE,) 2 MG/1.5ML SOPN, Inject 0.5 mg into the skin once a week., Disp: 4.5 mL, Rfl: 1   Vibegron (GEMTESA) 75 MG TABS, Take 75 mg by mouth daily., Disp: , Rfl:    Vitamin D, Ergocalciferol, (DRISDOL) 1.25 MG (50000 UNIT) CAPS capsule, TAKE ONE CAPSULE BY MOUTH ONCE WEEKLY ON WEDNESDAY, Disp: 13 capsule, Rfl: 1   Finerenone (KERENDIA) 10 MG TABS, Take 1 tablet by mouth daily., Disp: 30 tablet, Rfl: 2   Allergies  Allergen Reactions   Pollen Extract Other (See Comments)    Congestion/sneezing   Tomato Hives and Itching   Codeine Hives, Itching and Nausea And Vomiting     Review of Systems  Constitutional: Negative.   Respiratory: Negative.    Cardiovascular: Negative.   Neurological: Negative.   Psychiatric/Behavioral: Negative.      Today's Vitals   09/26/21 1415  BP: (!) 142/98  Pulse: 98  Temp: 98.1 F (36.7 C)  Weight: 173 lb 9.6 oz (78.7 kg)  Height: 5' 2"  (1.575 m)   Body mass index is 31.75 kg/m.  Wt Readings from Last 3 Encounters:  09/26/21 173 lb 9.6 oz (78.7 kg)  06/20/21 166 lb 6.4 oz (75.5 kg)  05/03/21 159 lb 3.2 oz (72.2 kg)    Objective:  Physical Exam Vitals reviewed.  Constitutional:      General: She is not in acute distress.    Appearance: Normal appearance.  Cardiovascular:     Rate and Rhythm: Normal rate and regular rhythm.     Pulses: Normal pulses.     Heart sounds: Normal heart sounds. No murmur heard. Pulmonary:     Effort: Pulmonary effort is normal. No respiratory distress.     Breath  sounds: Normal breath sounds. No wheezing.  Neurological:     General: No focal deficit present.     Mental Status: She is alert and oriented to person, place, and time.     Cranial Nerves: No cranial nerve deficit.     Motor: No weakness.  Psychiatric:  Mood and Affect: Mood normal.        Behavior: Behavior normal.        Thought Content: Thought content normal.        Judgment: Judgment normal.        Assessment And Plan:     1. Type 2 diabetes mellitus with stage 3a chronic kidney disease, with long-term current use of insulin (HCC) Comments: HgbA1c is slightly improving with her blood sugar also improving. Continue Kerendia - Finerenone (KERENDIA) 10 MG TABS; Take 1 tablet by mouth daily.  Dispense: 30 tablet; Refill: 2 - Hemoglobin A1c - CMP14+EGFR  2. Essential hypertension Comments: Blood pressure is slightly elevated, make sure you are taking your medications as directed. Continue to avoid high salt foods  3. Mixed hyperlipidemia Comments: Stable, tolerating statins well.  - Lipid panel  4. Vitamin D deficiency Will check vitamin D level and supplement as needed.    Also encouraged to spend 15 minutes in the sun daily.  - VITAMIN D 25 Hydroxy (Vit-D Deficiency, Fractures)  5. Class 1 obesity due to excess calories without serious comorbidity with body mass index (BMI) of 31.0 to 31.9 in adult Chronic Discussed healthy diet and regular exercise options  Encouraged to exercise at least 150 minutes per week with 2 days of strength training.   She is encouraged to strive for BMI less than 30 to decrease cardiac risk.   6. Immunization due - Zoster Vaccine Adjuvanted [facility]) injection; Inject 0.5 mLs into the muscle once for 1 dose.  Dispense: 0.5 mL; Refill: 0     Patient was given opportunity to ask questions. Patient verbalized understanding of the plan and was able to repeat key elements of the plan. All questions were answered to their satisfaction.   [name], FNP    I, [name], FNP, have reviewed all documentation for this visit. The documentation on 09/26/21 for the exam, diagnosis, procedures, and orders are all accurate and complete.  IF YOU HAVE BEEN REFERRED TO A SPECIALIST, IT MAY TAKE 1-2 WEEKS TO SCHEDULE/PROCESS THE REFERRAL. IF YOU HAVE NOT HEARD FROM US/SPECIALIST IN TWO WEEKS, PLEASE GIVE [place] A CALL AT [phone].   THE PATIENT IS ENCOURAGED TO PRACTICE SOCIAL DISTANCING DUE TO THE COVID-19 PANDEMIC.

## 2021-09-27 LAB — CMP14+EGFR
ALT: 16 IU/L (ref 0–32)
AST: 18 IU/L (ref 0–40)
Albumin/Globulin Ratio: 1.1 — ABNORMAL LOW (ref 1.2–2.2)
Albumin: 3.8 g/dL (ref 3.8–4.8)
Alkaline Phosphatase: 121 IU/L (ref 44–121)
BUN/Creatinine Ratio: 14 (ref 12–28)
BUN: 17 mg/dL (ref 8–27)
Bilirubin Total: 0.3 mg/dL (ref 0.0–1.2)
CO2: 26 mmol/L (ref 20–29)
Calcium: 9.8 mg/dL (ref 8.7–10.3)
Chloride: 102 mmol/L (ref 96–106)
Creatinine, Ser: 1.22 mg/dL — ABNORMAL HIGH (ref 0.57–1.00)
Globulin, Total: 3.4 g/dL (ref 1.5–4.5)
Glucose: 79 mg/dL (ref 70–99)
Potassium: 4.3 mmol/L (ref 3.5–5.2)
Sodium: 143 mmol/L (ref 134–144)
Total Protein: 7.2 g/dL (ref 6.0–8.5)
eGFR: 48 mL/min/{1.73_m2} — ABNORMAL LOW (ref >59)

## 2021-09-27 LAB — LIPID PANEL
Chol/HDL Ratio: 3.5 ratio (ref 0.0–4.4)
Cholesterol, Total: 190 mg/dL (ref 100–199)
HDL: 55 mg/dL (ref >39)
LDL Chol Calc (NIH): 123 mg/dL — ABNORMAL HIGH (ref 0–99)
Triglycerides: 65 mg/dL (ref 0–149)
VLDL Cholesterol Cal: 12 mg/dL (ref 5–40)

## 2021-09-27 LAB — HEMOGLOBIN A1C
Est. average glucose Bld gHb Est-mCnc: 243 mg/dL
Hgb A1c MFr Bld: 10.1 % — ABNORMAL HIGH (ref 4.8–5.6)

## 2021-09-27 LAB — VITAMIN D 25 HYDROXY (VIT D DEFICIENCY, FRACTURES): Vit D, 25-Hydroxy: 117 ng/mL — ABNORMAL HIGH (ref 30.0–100.0)

## 2021-09-28 DIAGNOSIS — N3020 Other chronic cystitis without hematuria: Secondary | ICD-10-CM | POA: Diagnosis not present

## 2021-10-03 DIAGNOSIS — E1140 Type 2 diabetes mellitus with diabetic neuropathy, unspecified: Secondary | ICD-10-CM | POA: Diagnosis not present

## 2021-10-03 DIAGNOSIS — Z7985 Long-term (current) use of injectable non-insulin antidiabetic drugs: Secondary | ICD-10-CM | POA: Diagnosis not present

## 2021-10-03 DIAGNOSIS — R5383 Other fatigue: Secondary | ICD-10-CM | POA: Diagnosis not present

## 2021-10-03 DIAGNOSIS — E1165 Type 2 diabetes mellitus with hyperglycemia: Secondary | ICD-10-CM | POA: Diagnosis not present

## 2021-10-03 DIAGNOSIS — Z794 Long term (current) use of insulin: Secondary | ICD-10-CM | POA: Diagnosis not present

## 2021-10-03 DIAGNOSIS — E1122 Type 2 diabetes mellitus with diabetic chronic kidney disease: Secondary | ICD-10-CM | POA: Diagnosis not present

## 2021-10-03 DIAGNOSIS — N189 Chronic kidney disease, unspecified: Secondary | ICD-10-CM | POA: Diagnosis not present

## 2021-10-06 ENCOUNTER — Telehealth: Payer: Self-pay

## 2021-10-06 NOTE — Chronic Care Management (AMB) (Signed)
°  [name] was reminded to have all medications, supplements and any blood glucose and blood pressure readings available for review with [name], Pharm. D, at her telephone visit on 10-13-2021 at 11:00.   Questions: Have you had any recent office visit or specialist visit outside of [place]? Patient stated she saw endocrinology 09-27-2021.  Are there any concerns you would like to discuss during your office visit? Patient stated   Are you having any problems obtaining your medications? (Whether it pharmacy issues or cost) Patient stated no  If patient has any PAP medications ask if they are having any problems getting their PAP medication or refill? No PAP medications.  Care Gaps: Yearly Ophthalmology exam overdue Shingrix overdue Covid Booster overdue AWV 01-24-2022  Star Rating Drug: Pravastatin 40 mg- Last filled 09-25-2021 [facility] Ozempic 0.5 mg- Last filled 09-04-2021 [address]   Any gaps in medications fill history? No  [place] Pharmacist Assistant [phone]

## 2021-10-09 ENCOUNTER — Telehealth: Payer: Medicare Other

## 2021-10-09 ENCOUNTER — Ambulatory Visit (INDEPENDENT_AMBULATORY_CARE_PROVIDER_SITE_OTHER): Payer: Medicare Other

## 2021-10-09 DIAGNOSIS — E1165 Type 2 diabetes mellitus with hyperglycemia: Secondary | ICD-10-CM

## 2021-10-09 DIAGNOSIS — Z9889 Other specified postprocedural states: Secondary | ICD-10-CM

## 2021-10-09 DIAGNOSIS — I10 Essential (primary) hypertension: Secondary | ICD-10-CM

## 2021-10-09 DIAGNOSIS — E782 Mixed hyperlipidemia: Secondary | ICD-10-CM

## 2021-10-10 NOTE — Chronic Care Management (AMB) (Signed)
Chronic Care Management   CCM RN Visit Note  10/09/2021 Name: [name] MRN: [id number] DOB: Apr 06, 1951  Subjective: [name] is a 71 y.o. year old female who is a primary care patient of [name], [place]. The care management team was consulted for assistance with disease management and care coordination needs.    Engaged with patient by telephone for follow up visit in response to provider referral for case management and/or care coordination services.   Consent to Services:  The patient was given information about Chronic Care Management services, agreed to services, and gave verbal consent prior to initiation of services.  Please see initial visit note for detailed documentation.   Patient agreed to services and verbal consent obtained.   Assessment: Review of patient past medical history, allergies, medications, health status, including review of consultants reports, laboratory and other test data, was performed as part of comprehensive evaluation and provision of chronic care management services.   SDOH (Social Determinants of Health) assessments and interventions performed:  Yes, no acute changes   CCM Care Plan  Allergies  Allergen Reactions   Pollen Extract Other (See Comments)    Congestion/sneezing   Tomato Hives and Itching   Codeine Hives, Itching and Nausea And Vomiting    Outpatient Encounter Medications as of 10/09/2021  Medication Sig   amLODipine (NORVASC) 5 MG tablet Take 1 tablet (5 mg total) by mouth at bedtime.   aspirin EC 81 MG EC tablet Take 1 tablet (81 mg total) by mouth daily. Swallow whole.   blood glucose meter kit and supplies KIT Dispense based on patient and insurance preference. Check blood sugars 4 times daily E11.69   Continuous Blood Gluc Receiver (FREESTYLE LIBRE READER) DEVI 1 each by Does not apply route See admin instructions. CGM device-Freestyle Libre   Continuous Blood Gluc Sensor (FREESTYLE LIBRE 14 DAY SENSOR)  MISC 1 each by Does not apply route See admin instructions. CGM 14-day sensors; send refills to [employer] (in Epic)   Finerenone (KERENDIA) 10 MG TABS Take 1 tablet by mouth daily.   HUMALOG 100 UNIT/ML injection Inject 0.1 mLs (10 Units total) into the skin 3 (three) times daily with meals.   insulin degludec (TRESIBA FLEXTOUCH) 100 UNIT/ML FlexTouch Pen Inject 27 Units into the skin daily.   Insulin Pen Needle (BD PEN NEEDLE MICRO U/F) 32G X 6 MM MISC DX:E11.65 USE TO CHECK BLOOD SUGARS THREE TIMES A DAY   Lancets Misc. MISC 1 each by Does not apply route 4 (four) times daily -  with meals and at bedtime.   linaclotide (LINZESS) 145 MCG CAPS capsule Take 1 capsule (145 mcg total) by mouth daily before breakfast.   Multiple Vitamin (MULTIVITAMIN WITH MINERALS) TABS tablet Take 1 tablet by mouth daily.   Multiple Vitamin (MULTIVITAMIN) tablet Take 1 tablet by mouth daily.   nystatin cream (MYCOSTATIN) Apply to affected area 2 times daily   ONETOUCH VERIO test strip USE TO test blood sugar FOUR TIMES DAILY   pantoprazole (PROTONIX) 40 MG tablet Take 1 tablet (40 mg total) by mouth daily.   pravastatin (PRAVACHOL) 40 MG tablet TAKE ONE TABLET BY MOUTH EVERYDAY AT BEDTIME   pregabalin (LYRICA) 50 MG capsule Take 1 capsule (50 mg total) by mouth 3 (three) times daily.   Semaglutide,0.25 or 0.5MG/DOS, (OZEMPIC, 0.25 OR 0.5 MG/DOSE,) 2 MG/1.5ML SOPN Inject 0.5 mg into the skin once a week.   Vibegron (GEMTESA) 75 MG TABS Take 75 mg by mouth daily.  Vitamin D, Ergocalciferol, (DRISDOL) 1.25 MG (50000 UNIT) CAPS capsule TAKE ONE CAPSULE BY MOUTH ONCE WEEKLY ON WEDNESDAY   No facility-administered encounter medications on file as of 10/09/2021.    Patient Active Problem List   Diagnosis Date Noted   Abdominal bloating 07/03/2021   Chronic idiopathic constipation 07/03/2021   Colon cancer screening 07/03/2021   Diverticulosis of large intestine without perforation or abscess without bleeding  07/03/2021   Dysphagia 07/03/2021   Flatulence, eructation and gas pain 07/03/2021   Hiatal hernia 07/03/2021   Quadriceps weakness 02/06/2021   Syncope 11/29/2020   Mild aortic stenosis 11/29/2020   Irritable bowel syndrome with diarrhea 10/10/2020   Gastroesophageal reflux disease with esophagitis 10/10/2020   Postlaminectomy syndrome, not elsewhere classified 07/03/2020   S/P lumbar laminectomy 03/30/2020   Localized swelling, mass and lump, multiple sites 03/27/2018   Type II diabetes mellitus with renal manifestations ([place]) 12/27/2017   HLD (hyperlipidemia) 12/27/2017   HTN (hypertension) 12/27/2017   CKD (chronic kidney disease), stage III ([place]) 12/27/2017   Cough variant asthma 12/01/2015   Osteoarthritis of left knee, primary localized 08/17/2014   Knee osteoarthritis 08/17/2014   Hypoglycemia 11/15/2013   Other and unspecified hyperlipidemia 08/06/2013   Type II diabetes mellitus, uncontrolled 07/20/2013   Varicose veins of lower extremities with other complications 17/00/1749   Fibromyalgia    GERD 05/18/2010   ABDOMINAL PAIN, GENERALIZED 05/02/2010   OBESITY 04/20/2010   BURSITIS, LEFT SHOULDER 03/09/2010   Lipoma of arm s/p excision 01/29/2014 01/10/2010   DEPRESSION 12/27/2009   BACK PAIN WITH RADICULOPATHY 12/27/2009   CYSTITIS, ACUTE 10/21/2009   MICROSCOPIC HEMATURIA 10/18/2009   KNEE PAIN, BILATERAL 10/18/2009   NEUROPATHY 09/08/2009   Asthma 09/08/2009   STRESS INCONTINENCE 09/08/2009   CHEST PAIN 07/19/2009   ESOPHAGEAL STRICTURE 08/27/2005    Conditions to be addressed/monitored: DM, HTN, HLD, s/p laminectomy   Care Plan : RN Care Manager Plan of Care  Updates made by [name], RN since 10/09/2021 12:00 AM     Problem: No plan established for management of chronic disease states (DM, HTN, HLD, s/p laminectomy)   Priority: High     Long-Range Goal: Development of plan of care for chronic disease management (DM, HTN, HLD, s/p laminectomy)    Start Date: 07/24/2021  Expected End Date: 07/24/2022  Recent Progress: On track  Priority: High  Note:   Current Barriers:  Knowledge Deficits related to plan of care for management of DM, HTN, HLD, s/p laminectomy   Chronic Disease Management support and education needs related to DM, HTN, HLD, s/p laminectomy    RNCM Clinical Goal(s):  Patient will verbalize basic understanding of  DM, HTN, HLD, s/p laminectomy  disease process and self health management plan as evidenced by patient will experience no disease exacerbations related to chronic disease states take all medications exactly as prescribed and will call provider for medication related questions as evidenced by patient will report having no missed doses of prescribed medications demonstrate Improved health management independence as evidenced by patient will continue to stay independent with self care continue to work with RN Care Manager to address care management and care coordination needs related to  DM, HTN, HLD, s/p laminectomy  as evidenced by adherence to CM Team Scheduled appointments demonstrate ongoing self health care management ability   as evidenced by    through collaboration with RN Care manager, provider, and care team.   Interventions: 1:1 collaboration with primary care provider regarding development and update  of comprehensive plan of care as evidenced by provider attestation and co-signature Inter-disciplinary care team collaboration (see longitudinal plan of care) Evaluation of current treatment plan related to DM, HTN, HLD, s/p laminectomy self management and patient's adherence to plan as established by provider  Diabetes Interventions:  (Status:  Goal on track:  Yes.) Long Term Goal Evaluation of current treatment plan related to DM self management and patient's adherence to plan as established by provider Determined patient completed an Endocrinology consultation with Jacolyn Reedy FNP for evaluation and  treatment of DM Reviewed and discussed the following Assessment/Plan:  Call [facility] and request Libre 3. They will send [place] paperwork  Continue Ozempic 0.5 mg weekly  Continue Tresiba 27 units in the morning  Decrease Humalog 8 units with meals (if having low carb meal give 4 units) plus correction scale  150-200 give 2 units  >201-300 give 3 units >400 give 4 units  Bring Libre reader to follow up  Please call or message if BG is consistently less than 70 or greater than 300  Re-Educated patient on dietary and exercise recommendations; daily glycemic control FBS 80-130, <180 after meals;15'15' rule Reviewed medications with patient and discussed importance of medication adherence Discussed plans with patient for ongoing care management follow up and provided patient with direct contact information for care management team Lab Results  Component Value Date   HGBA1C 11.2 (H) 06/20/2021    Chronic Kidney Disease Interventions:  (Status:  New goal.) Long Term Goal Assessed the Patient understanding of chronic kidney disease    Evaluation of current treatment plan related to chronic kidney disease self management and patient's adherence to plan as established by provider      Reviewed prescribed diet increase water to 48-64 oz daily  Assessed social determinant of health barriers    Provided education on kidney disease progression    Mailed printed educational materials related to stages of chronic kidney disease; eating right with chronic kidney disease Discussed plans with patient for ongoing care management follow up and provided patient with direct contact information for care management team Last practice recorded BP readings:  BP Readings from Last 3 Encounters:  09/26/21 (!) 142/98  06/20/21 130/82  05/30/21 (!) 168/97  Most recent eGFR/CrCl:  Lab Results  Component Value Date   EGFR 48 (L) 09/26/2021    No components found for: CRCL  Patient Goals/Self-Care Activities: Take  all medications as prescribed Attend all scheduled provider appointments Call pharmacy for medication refills 3-7 days in advance of running out of medications Attend church or other social activities Perform all self care activities independently  Perform IADL's (shopping, preparing meals, housekeeping, managing finances) independently Call provider office for new concerns or questions  schedule appointment with eye doctor check feet daily for cuts, sores or redness enter blood sugar readings and medication or insulin into daily log drink 6 to 8 glasses of water each day manage portion size  Follow Up Plan:  Telephone follow up appointment with care management team member scheduled for:  01/26/22      [name], RN, BSN, CCM Care Management Coordinator [place] [facility]  Direct Phone: [phone]

## 2021-10-10 NOTE — Patient Instructions (Signed)
Visit Information  Thank you for taking time to visit with me today. Please don't hesitate to contact me if I can be of assistance to you before our next scheduled telephone appointment.  Following are the goals we discussed today:  (Copy and paste patient goals from clinical care plan here)  Our next appointment is by telephone on 01/26/22 at 10:30 AM   Please call the care guide team at [phone] if you need to cancel or reschedule your appointment.   If you are experiencing a Mental Health or [place] or need someone to talk to, please call [phone]-TALK (toll free, 24 hour hotline)   Patient verbalizes understanding of instructions and care plan provided today and agrees to view in [place]. Active MyChart status confirmed with patient.    [name], RN, BSN, CCM Care Management Coordinator [place] [facility]  Direct Phone: [phone]

## 2021-10-11 DIAGNOSIS — E118 Type 2 diabetes mellitus with unspecified complications: Secondary | ICD-10-CM | POA: Diagnosis not present

## 2021-10-11 DIAGNOSIS — Z794 Long term (current) use of insulin: Secondary | ICD-10-CM | POA: Diagnosis not present

## 2021-10-13 ENCOUNTER — Telehealth: Payer: Medicare Other

## 2021-10-13 NOTE — Progress Notes (Unsigned)
Chronic Care Management Pharmacy Note  10/13/2021 Name:  [name] MRN:  [id number] DOB:  1950/09/12  Summary: ***  Recommendations/Changes made from today's visit: ***  Plan: ***   Subjective: [name] is an 71 y.o. year old female who is a primary patient of [name], [place].  The CCM team was consulted for assistance with disease management and care coordination needs.    {CCMTELEPHONEFACETOFACE:21091510} for {CCMINITIALFOLLOWUPCHOICE:21091511} in response to provider referral for pharmacy case management and/or care coordination services.   Consent to Services:  {CCMCONSENTOPTIONS:25074}  Patient Care Team: [name], FNP as PCP - General (General Practice) Elayne Snare, MD as Consulting Physician (Endocrinology) Bjorn Loser, MD as Consulting Physician (Urology) Juanita Craver, MD as Consulting Physician (Gastroenterology) Rex Kras, Claudette Stapler, RN as Case Manager Mayford Knife, [facility] (Pharmacist)  Recent office visits: ***  Recent consult visits: [facility] visits: {Hospital DC Yes/No:25215}   Objective:  Lab Results  Component Value Date   CREATININE 1.22 (H) 09/26/2021   BUN 17 09/26/2021   GFR 51.07 (L) 05/17/2017   EGFR 48 (L) 09/26/2021   GFRNONAA 59 (L) 12/01/2020   GFRAA 57 (L) 07/07/2020   NA 143 09/26/2021   K 4.3 09/26/2021   CALCIUM 9.8 09/26/2021   CO2 26 09/26/2021   GLUCOSE 79 09/26/2021    Lab Results  Component Value Date/Time   HGBA1C 10.1 (H) 09/26/2021 02:44 PM   HGBA1C 11.2 (H) 06/20/2021 10:49 AM   FRUCTOSAMINE 388 (H) 05/17/2017 08:54 AM   FRUCTOSAMINE 286 (H) 07/20/2014 10:07 AM   GFR 51.07 (L) 05/17/2017 08:54 AM   GFR 64.19 02/20/2017 11:52 AM   MICROALBUR 80 12/13/2020 05:01 PM   MICROALBUR 80 12/31/2019 03:19 PM    Last diabetic Eye exam: No results found for: HMDIABEYEEXA  Last diabetic Foot exam: No results found for: HMDIABFOOTEX   Lab Results  Component Value Date   CHOL 190  09/26/2021   HDL 55 09/26/2021   LDLCALC 123 (H) 09/26/2021   LDLDIRECT 152.8 10/09/2013   TRIG 65 09/26/2021   CHOLHDL 3.5 09/26/2021    Hepatic Function Latest Ref Rng & Units 09/26/2021 04/13/2021 07/07/2020  Total Protein 6.0 - 8.5 g/dL 7.2 6.1 7.0  Albumin 3.8 - 4.8 g/dL 3.8 3.6(L) 4.2  AST 0 - 40 IU/L 18 11 19   ALT 0 - 32 IU/L 16 6 17   Alk Phosphatase 44 - 121 IU/L 121 108 107  Total Bilirubin 0.0 - 1.2 mg/dL 0.3 0.6 0.3  Bilirubin, Direct 0.00 - 0.40 mg/dL - - -    Lab Results  Component Value Date/Time   TSH 1.990 06/09/2018 12:42 PM   TSH 2.047 05/31/2010 11:11 PM    CBC Latest Ref Rng & Units 04/13/2021 12/13/2020 12/01/2020  WBC 3.4 - 10.8 x10E3/uL 5.9 5.7 4.3  Hemoglobin 11.1 - 15.9 g/dL 13.3 15.1 13.9  Hematocrit 34.0 - 46.6 % 41.3 46.5 42.7  Platelets 150 - 450 x10E3/uL 274 289 238    Lab Results  Component Value Date/Time   VD25OH 117.0 (H) 09/26/2021 02:44 PM   VD25OH 34.4 05/03/2020 02:55 PM    Clinical ASCVD: {YES/NO:21197} The 10-year ASCVD risk score (Arnett DK, et al., 2019) is: 41.2%   Values used to calculate the score:     Age: 89 years     Sex: Female     Is Non-Hispanic African American: Yes     Diabetic: Yes     Tobacco smoker: No     Systolic  Blood Pressure: 176 mmHg     Is BP treated: Yes     HDL Cholesterol: 55 mg/dL     Total Cholesterol: 190 mg/dL    Depression screen [facility] 2/9 01/11/2021 12/31/2019 10/22/2019  Decreased Interest 0 0 0  Down, Depressed, Hopeless 0 0 0  PHQ - 2 Score 0 0 0  Altered sleeping - 0 -  Tired, decreased energy - 0 -  Change in appetite - 0 -  Feeling bad or failure about yourself  - 0 -  Trouble concentrating - 0 -  Moving slowly or fidgety/restless - 0 -  Suicidal thoughts - 0 -  PHQ-9 Score - 0 -  Difficult doing work/chores - Not difficult at all -  Some recent data might be hidden     ***Other: (CHADS2VASc if Afib, MMRC or CAT for COPD, ACT, DEXA)  Social History   Tobacco Use  Smoking Status  Never  Smokeless Tobacco Never   BP Readings from Last 3 Encounters:  09/26/21 (!) 142/98  06/20/21 130/82  05/30/21 (!) 168/97   Pulse Readings from Last 3 Encounters:  09/26/21 98  06/20/21 81  05/30/21 84   Wt Readings from Last 3 Encounters:  09/26/21 173 lb 9.6 oz (78.7 kg)  06/20/21 166 lb 6.4 oz (75.5 kg)  05/03/21 159 lb 3.2 oz (72.2 kg)   BMI Readings from Last 3 Encounters:  09/26/21 31.75 kg/m  06/20/21 30.43 kg/m  05/03/21 29.12 kg/m    Assessment/Interventions: Review of patient past medical history, allergies, medications, health status, including review of consultants reports, laboratory and other test data, was performed as part of comprehensive evaluation and provision of chronic care management services.   SDOH:  (Social Determinants of Health) assessments and interventions performed: {yes/no:20286}  SDOH Screenings   Alcohol Screen: Not on file  Depression (PHQ2-9): Low Risk    PHQ-2 Score: 0  Financial Resource Strain: Low Risk    Difficulty of Paying Living Expenses: Not hard at all  Food Insecurity: No Food Insecurity   Worried About Charity fundraiser in the Last Year: Never true   Ran Out of Food in the Last Year: Never true  Housing: Not on file  Physical Activity: Inactive   Days of Exercise per Week: 0 days   Minutes of Exercise per Session: 0 min  Social Connections: Not on file  Stress: No Stress Concern Present   Feeling of Stress : Not at all  Tobacco Use: Low Risk    Smoking Tobacco Use: Never   Smokeless Tobacco Use: Never   Passive Exposure: Not on file  Transportation Needs: No Transportation Needs   Lack of Transportation (Medical): No   Lack of Transportation (Non-Medical): No    CCM Care Plan  Allergies  Allergen Reactions   Pollen Extract Other (See Comments)    Congestion/sneezing   Tomato Hives and Itching   Codeine Hives, Itching and Nausea And Vomiting    Medications Reviewed Today     Reviewed by [name], RN (Registered Nurse) on 10/09/21 at 1146  Med List Status: <None>   Medication Order Taking? Sig Documenting Provider Last Dose Status Informant  amLODipine (NORVASC) 5 MG tablet [id number] No Take 1 tablet (5 mg total) by mouth at bedtime. [name], FNP Taking Active   aspirin EC 81 MG EC tablet [id number] No Take 1 tablet (81 mg total) by mouth daily. Swallow whole. Regalado, Belkys A, MD Taking Active   blood glucose meter kit  and supplies KIT [id number] No Dispense based on patient and insurance preference. Check blood sugars 4 times daily E11.69 [name], FNP Taking Active   Continuous Blood Gluc Receiver (FREESTYLE LIBRE READER) DEVI [id number] No 1 each by Does not apply route See admin instructions. CGM device-Freestyle Elenor Legato [provider] Taking Active Self  Continuous Blood Gluc Sensor (FREESTYLE LIBRE 14 DAY SENSOR) [place] [id number] No 1 each by Does not apply route See admin instructions. CGM 14-day sensors; send refills to [employer] (in [place]) [provider] Taking Active Self  Finerenone (KERENDIA) 10 MG TABS [id number]  Take 1 tablet by mouth daily. [name], FNP  Active   HUMALOG 100 UNIT/ML injection [id number] No Inject 0.1 mLs (10 Units total) into the skin 3 (three) times daily with meals. [name], FNP Taking Active   insulin degludec (TRESIBA FLEXTOUCH) 100 UNIT/ML FlexTouch Pen [id number] No Inject 27 Units into the skin daily. [name], FNP Taking Active   Insulin Pen Needle (BD PEN NEEDLE MICRO U/F) 32G X 6 MM MISC [id number] No DX:E11.65 USE TO CHECK BLOOD SUGARS THREE TIMES A Lynnell Dike, MD Taking Active Self  Lancets Misc. [employer] [id number] No 1 each by Does not apply route 4 (four) times daily -  with meals and at bedtime. [name], FNP Taking Active Self  linaclotide Rolan Lipa) 145 MCG CAPS capsule [id number] No Take 1 capsule (145 mcg total) by mouth daily before breakfast. [name], FNP Taking Active    Multiple Vitamin (MULTIVITAMIN WITH MINERALS) TABS tablet [id number] No Take 1 tablet by mouth daily. [provider] Taking Active Self  Multiple Vitamin (MULTIVITAMIN) tablet [id number] No Take 1 tablet by mouth daily. [provider] Taking Active   nystatin cream (MYCOSTATIN) [id number] No Apply to affected area 2 times daily Volney American, PA-C Taking Active   [facility] VERIO test strip [id number] No USE TO test blood sugar FOUR TIMES DAILY [name], FNP Taking Active   pantoprazole (PROTONIX) 40 MG tablet [id number] No Take 1 tablet (40 mg total) by mouth daily. [name], FNP Taking Active   pravastatin (PRAVACHOL) 40 MG tablet [id number] No TAKE ONE TABLET BY MOUTH EVERYDAY AT BEDTIME [name], FNP Taking Active   pregabalin (LYRICA) 50 MG capsule [id number] No Take 1 capsule (50 mg total) by mouth 3 (three) times daily. [name], FNP Taking Active   Semaglutide,0.25 or 0.5MG/DOS, (OZEMPIC, 0.25 OR 0.5 MG/DOSE,) 2 MG/1.5ML SOPN [id number] No Inject 0.5 mg into the skin once a week. [name], FNP Taking Active   Vibegron [facility]) 75 MG TABS [id number] No Take 75 mg by mouth daily. [provider] Taking Active Self  Vitamin D, Ergocalciferol, (DRISDOL) 1.25 MG (50000 UNIT) CAPS capsule [id number] No TAKE ONE CAPSULE BY MOUTH ONCE WEEKLY ON Marlana Latus [name], FNP Taking Active             Patient Active Problem List   Diagnosis Date Noted   Abdominal bloating 07/03/2021   Chronic idiopathic constipation 07/03/2021   Colon cancer screening 07/03/2021   Diverticulosis of large intestine without perforation or abscess without bleeding 07/03/2021   Dysphagia 07/03/2021   Flatulence, eructation and gas pain 07/03/2021   Hiatal hernia 07/03/2021   Quadriceps weakness 02/06/2021   Syncope 11/29/2020   Mild aortic stenosis 11/29/2020   Irritable bowel syndrome with diarrhea 10/10/2020   Gastroesophageal reflux disease with  esophagitis 10/10/2020   Postlaminectomy syndrome, not elsewhere classified 07/03/2020   S/P lumbar laminectomy 03/30/2020  Localized swelling, mass and lump, multiple sites 03/27/2018   Type II diabetes mellitus with renal manifestations ([place]) 12/27/2017   HLD (hyperlipidemia) 12/27/2017   HTN (hypertension) 12/27/2017   CKD (chronic kidney disease), stage III ([place]) 12/27/2017   Cough variant asthma 12/01/2015   Osteoarthritis of left knee, primary localized 08/17/2014   Knee osteoarthritis 08/17/2014   Hypoglycemia 11/15/2013   Other and unspecified hyperlipidemia 08/06/2013   Type II diabetes mellitus, uncontrolled 07/20/2013   Varicose veins of lower extremities with other complications 08/19/4974   Fibromyalgia    GERD 05/18/2010   ABDOMINAL PAIN, GENERALIZED 05/02/2010   OBESITY 04/20/2010   BURSITIS, LEFT SHOULDER 03/09/2010   Lipoma of arm s/p excision 01/29/2014 01/10/2010   DEPRESSION 12/27/2009   BACK PAIN WITH RADICULOPATHY 12/27/2009   CYSTITIS, ACUTE 10/21/2009   MICROSCOPIC HEMATURIA 10/18/2009   KNEE PAIN, BILATERAL 10/18/2009   NEUROPATHY 09/08/2009   Asthma 09/08/2009   STRESS INCONTINENCE 09/08/2009   CHEST PAIN 07/19/2009   ESOPHAGEAL STRICTURE 08/27/2005    Immunization History  Administered Date(s) Administered   Fluad Quad(high Dose 65+) 05/03/2020, 05/03/2021   Influenza Whole 06/27/2009, 06/13/2010   Influenza, High Dose Seasonal PF 07/22/2019   Influenza,inj,Quad PF,6+ Mos 06/03/2017   Influenza-Unspecified 03/27/2013, 03/27/2014, 05/18/2015   PFIZER(Purple Top)SARS-COV-2 Vaccination 09/21/2019, 10/02/2019, 05/24/2020   Pneumococcal Conjugate-13 07/24/2017   Pneumococcal Polysaccharide-23 08/27/2006, 11/29/2009, 03/24/2019   Td 10/18/2009   Tdap 12/27/2017    Conditions to be addressed/monitored:  {USCCMDZASSESSMENTOPTIONS:23563}  There are no care plans that you recently modified to display for this patient.    Medication Assistance:  {MEDASSISTANCEINFO:25044}  Compliance/Adherence/Medication fill history: Care Gaps: ***  Star-Rating Drugs: ***  Patient's preferred pharmacy is:  [facility] [place], [place] - [address] [place] [address] Phone: [phone] Fax: [phone]  [facility] [phone] - [place], [place] [address] [place] [address] Phone: [phone] Fax: [place], [place] - [address] [place] [address] Phone: [phone] Fax: [phone]  Uses pill box? {Yes or If no, why not?:20788} Pt endorses ***% compliance  We discussed: {Pharmacy options:24294} Patient decided to: {US Pharmacy Plan:23885}  Care Plan and Follow Up Patient Decision:  {FOLLOWUP:24991}  Plan: {CM FOLLOW UP KFMM:03754}  ***   Current Barriers:  {pharmacybarriers:24917}  Pharmacist Clinical Goal(s):  Patient will {PHARMACYGOALCHOICES:24921} through collaboration with PharmD and provider.   Interventions: 1:1 collaboration with [name], FNP regarding development and update of comprehensive plan of care as evidenced by provider attestation and co-signature Inter-disciplinary care team collaboration (see longitudinal plan of care) Comprehensive medication review performed; medication list updated in electronic medical record  {CCM PHARMD DISEASE STATES:25130}  Patient Goals/Self-Care Activities Patient will:  - {pharmacypatientgoals:24919}  Follow Up Plan: {CM FOLLOW UP HKGO:77034}

## 2021-10-18 ENCOUNTER — Telehealth: Payer: Self-pay

## 2021-10-18 NOTE — Chronic Care Management (AMB) (Signed)
° ° °  Chronic Care Management Pharmacy Assistant   Name: [name]  MRN: [id number] DOB: May 09, 1951  Reason for Encounter: Reschedule appointment   Medications: Outpatient Encounter Medications as of 10/18/2021  Medication Sig   amLODipine (NORVASC) 5 MG tablet Take 1 tablet (5 mg total) by mouth at bedtime.   aspirin EC 81 MG EC tablet Take 1 tablet (81 mg total) by mouth daily. Swallow whole.   blood glucose meter kit and supplies KIT Dispense based on patient and insurance preference. Check blood sugars 4 times daily E11.69   Continuous Blood Gluc Receiver (FREESTYLE LIBRE READER) DEVI 1 each by Does not apply route See admin instructions. CGM device-Freestyle Libre   Continuous Blood Gluc Sensor (FREESTYLE LIBRE 14 DAY SENSOR) MISC 1 each by Does not apply route See admin instructions. CGM 14-day sensors; send refills to [employer] (in Epic)   Finerenone (KERENDIA) 10 MG TABS Take 1 tablet by mouth daily.   HUMALOG 100 UNIT/ML injection Inject 0.1 mLs (10 Units total) into the skin 3 (three) times daily with meals.   insulin degludec (TRESIBA FLEXTOUCH) 100 UNIT/ML FlexTouch Pen Inject 27 Units into the skin daily.   Insulin Pen Needle (BD PEN NEEDLE MICRO U/F) 32G X 6 MM MISC DX:E11.65 USE TO CHECK BLOOD SUGARS THREE TIMES A DAY   Lancets Misc. MISC 1 each by Does not apply route 4 (four) times daily -  with meals and at bedtime.   linaclotide (LINZESS) 145 MCG CAPS capsule Take 1 capsule (145 mcg total) by mouth daily before breakfast.   Multiple Vitamin (MULTIVITAMIN WITH MINERALS) TABS tablet Take 1 tablet by mouth daily.   Multiple Vitamin (MULTIVITAMIN) tablet Take 1 tablet by mouth daily.   nystatin cream (MYCOSTATIN) Apply to affected area 2 times daily   ONETOUCH VERIO test strip USE TO test blood sugar FOUR TIMES DAILY   pantoprazole (PROTONIX) 40 MG tablet Take 1 tablet (40 mg total) by mouth daily.   pravastatin (PRAVACHOL) 40 MG tablet TAKE ONE TABLET BY MOUTH  EVERYDAY AT BEDTIME   pregabalin (LYRICA) 50 MG capsule Take 1 capsule (50 mg total) by mouth 3 (three) times daily.   Semaglutide,0.25 or 0.5MG/DOS, (OZEMPIC, 0.25 OR 0.5 MG/DOSE,) 2 MG/1.5ML SOPN Inject 0.5 mg into the skin once a week.   Vibegron (GEMTESA) 75 MG TABS Take 75 mg by mouth daily.   Vitamin D, Ergocalciferol, (DRISDOL) 1.25 MG (50000 UNIT) CAPS capsule TAKE ONE CAPSULE BY MOUTH ONCE WEEKLY ON WEDNESDAY   No facility-administered encounter medications on file as of 10/18/2021.   10-18-2021: Called patient to reschedule missed appointment on 10-13-2021 to 11-10-2021.  [place] Pharmacist Assistant [phone]

## 2021-10-19 ENCOUNTER — Telehealth: Payer: Self-pay

## 2021-10-19 NOTE — Chronic Care Management (AMB) (Signed)
Chronic Care Management Pharmacy Assistant   Name: [name]  MRN: [id number] DOB: Jul 25, 1951  Reason for Encounter: Medication Review/ Medication Coordination  Recent office visits:  10-09-2021 [name], RN (CCM)  09-26-2021 [name], [place]. Recent consult visits:  10-03-2021 [name], [place] (Ophthalmology). Initial consult for diabetic eye exam. Creatinine= 1.22, eGFR= 48, Albumin/Globulin= 1.1. A1C= 10.1. LDL= 123. Vitamin D= 117.0. Shingrix ordered.  Hospital visits:  Medication Reconciliation was completed by comparing discharge summary, patients EMR and Pharmacy list, and upon discussion with patient.   Admitted to the hospital on 05-30-2021 due to groin swelling. Discharge date was 05-30-2021 Discharged from [place] urgent care [place]?Medications Started at [facility] Discharge:?? Keflex 500 mg twice daily for 5 days Nystatin cream twice daily   Medication Changes at Hospital Discharge: None   Medications Discontinued at Hospital Discharge: None   Medications that remain the same after Hospital Discharge:??  -All other medications will remain the same.      Medications: Outpatient Encounter Medications as of 10/19/2021  Medication Sig   amLODipine (NORVASC) 5 MG tablet Take 1 tablet (5 mg total) by mouth at bedtime.   aspirin EC 81 MG EC tablet Take 1 tablet (81 mg total) by mouth daily. Swallow whole.   blood glucose meter kit and supplies KIT Dispense based on patient and insurance preference. Check blood sugars 4 times daily E11.69   Continuous Blood Gluc Receiver (FREESTYLE LIBRE READER) DEVI 1 each by Does not apply route See admin instructions. CGM device-Freestyle Libre   Continuous Blood Gluc Sensor (FREESTYLE LIBRE 14 DAY SENSOR) MISC 1 each by Does not apply route See admin instructions. CGM 14-day sensors; send refills to [employer] (in Epic)   Finerenone (KERENDIA) 10 MG TABS Take 1 tablet by mouth  daily.   HUMALOG 100 UNIT/ML injection Inject 0.1 mLs (10 Units total) into the skin 3 (three) times daily with meals.   insulin degludec (TRESIBA FLEXTOUCH) 100 UNIT/ML FlexTouch Pen Inject 27 Units into the skin daily.   Insulin Pen Needle (BD PEN NEEDLE MICRO U/F) 32G X 6 MM MISC DX:E11.65 USE TO CHECK BLOOD SUGARS THREE TIMES A DAY   Lancets Misc. MISC 1 each by Does not apply route 4 (four) times daily -  with meals and at bedtime.   linaclotide (LINZESS) 145 MCG CAPS capsule Take 1 capsule (145 mcg total) by mouth daily before breakfast.   Multiple Vitamin (MULTIVITAMIN WITH MINERALS) TABS tablet Take 1 tablet by mouth daily.   Multiple Vitamin (MULTIVITAMIN) tablet Take 1 tablet by mouth daily.   nystatin cream (MYCOSTATIN) Apply to affected area 2 times daily   ONETOUCH VERIO test strip USE TO test blood sugar FOUR TIMES DAILY   pantoprazole (PROTONIX) 40 MG tablet Take 1 tablet (40 mg total) by mouth daily.   pravastatin (PRAVACHOL) 40 MG tablet TAKE ONE TABLET BY MOUTH EVERYDAY AT BEDTIME   pregabalin (LYRICA) 50 MG capsule Take 1 capsule (50 mg total) by mouth 3 (three) times daily.   Semaglutide,0.25 or 0.5MG/DOS, (OZEMPIC, 0.25 OR 0.5 MG/DOSE,) 2 MG/1.5ML SOPN Inject 0.5 mg into the skin once a week.   Vibegron (GEMTESA) 75 MG TABS Take 75 mg by mouth daily.   Vitamin D, Ergocalciferol, (DRISDOL) 1.25 MG (50000 UNIT) CAPS capsule TAKE ONE CAPSULE BY MOUTH ONCE WEEKLY ON WEDNESDAY   No facility-administered encounter medications on file as of 10/19/2021.   Reviewed chart for medication changes ahead of  medication coordination call.  No hospital visits since last care coordination call/Pharmacist visit.   No medication changes indicated OR if recent visit, treatment plan here.  BP Readings from Last 3 Encounters:  09/26/21 (!) 142/98  06/20/21 130/82  05/30/21 (!) 168/97    Lab Results  Component Value Date   HGBA1C 10.1 (H) 09/26/2021     Patient obtains medications  through Adherence Packaging  30 Days   Last adherence delivery included:  Tresiba 25 units daily (Increased to 27 units) Pravastatin 40 mg 1 tablet daily (bedtime) Kerendia 10 mg daily at (breakfast) Women's 50 Plus Multivitamin 1 tablet daily (breakfast) Lyrica 50 mg 3 times daily 1 before breakfast, 1 with evening meal and 1 at (bedtime).  Amlodipine 5 mg 1 tablet daily (bedtime) Pantoprazole 40 mg 1 tablet daily (breakfast) Ergocalciferol 1250 mcg 1 capsule weekly on Wednesdays (breakfast) Linzess 145 mcg 1 tablet daily(before breakfast)  Patient declined (meds) last month due to PRN use/additional supply on hand. Explanation of abundance on hand (ie #30 due to overlapping fills or previous adherence issues etc)  Patient is due for next adherence delivery on: 10-31-2021  Called patient and reviewed medications and coordinated delivery.  This delivery to include: Tresiba 25 units daily (Increased to 27 units) Pravastatin 40 mg 1 tablet daily (bedtime) Kerendia 10 mg daily at (breakfast) Women's 50 Plus Multivitamin 1 tablet daily (breakfast) Lyrica 50 mg 3 times daily 1 before breakfast, 1 with evening meal and 1 at (bedtime).  Amlodipine 5 mg 1 tablet daily (bedtime) Pantoprazole 40 mg 1 tablet daily (breakfast) Ergocalciferol 1250 mcg 1 capsule weekly on Wednesdays (breakfast) Linzess 145 mcg 1 tablet daily(before breakfast)  No short/acute fill needed  Patient declined the following medications: None  Patient needs refills for: Refill request sent Lyrica  Confirmed delivery date of 10-31-2021. advised patient that pharmacy will contact them the morning of delivery.  Care Gaps: Yearly Ophthalmology exam overdue Shingrix overdue Covid Booster overdue AWV 01-24-2022  Star Rating Drugs: Pravastatin 40 mg- Last filled 09-25-2021 30 DS [facility] Ozempic 0.5 mg- Last filled 09-04-2021 84 DS [place] Clinical Pharmacist Assistant [phone]

## 2021-10-20 ENCOUNTER — Telehealth: Payer: Medicare Other

## 2021-10-24 ENCOUNTER — Other Ambulatory Visit: Payer: Self-pay | Admitting: Nurse Practitioner

## 2021-10-24 DIAGNOSIS — E1142 Type 2 diabetes mellitus with diabetic polyneuropathy: Secondary | ICD-10-CM

## 2021-10-24 DIAGNOSIS — E782 Mixed hyperlipidemia: Secondary | ICD-10-CM

## 2021-10-24 DIAGNOSIS — I10 Essential (primary) hypertension: Secondary | ICD-10-CM | POA: Diagnosis not present

## 2021-10-24 DIAGNOSIS — E1165 Type 2 diabetes mellitus with hyperglycemia: Secondary | ICD-10-CM | POA: Diagnosis not present

## 2021-10-25 ENCOUNTER — Other Ambulatory Visit: Payer: Self-pay | Admitting: Nurse Practitioner

## 2021-10-25 DIAGNOSIS — E1142 Type 2 diabetes mellitus with diabetic polyneuropathy: Secondary | ICD-10-CM

## 2021-10-25 DIAGNOSIS — N3020 Other chronic cystitis without hematuria: Secondary | ICD-10-CM | POA: Diagnosis not present

## 2021-10-25 MED ORDER — PREGABALIN 50 MG PO CAPS: ORAL_CAPSULE | ORAL | 5 refills | Status: DC

## 2021-10-26 ENCOUNTER — Telehealth: Payer: Self-pay

## 2021-10-26 NOTE — Chronic Care Management (AMB) (Signed)
? ? ?Chronic Care Management ?Pharmacy Assistant  ? ?Name: [name]  MRN: [id number] DOB: 24-Jun-1951 ? ?Reason for Encounter: Disease State/ Diabetes ? ?Recent office visits:  ?None ? ?Recent consult visits:  ?None ? ?Hospital visits:  ?Medication Reconciliation was completed by comparing discharge summary, patient?s EMR and Pharmacy list, and upon discussion with patient. ?  ?Admitted to the hospital on 05-30-2021 due to groin swelling. Discharge date was 05-30-2021 Discharged from [place] [facility].   ?  ?New?Medications Started at [facility] Discharge:?? ?Keflex 500 mg twice daily for 5 days ?Nystatin cream twice daily ?  ?Medication Changes at Hospital Discharge: ?None ?  ?Medications Discontinued at Hospital Discharge: ?None ?  ?Medications that remain the same after Hospital Discharge:??  ?-All other medications will remain the same.   ?  ? ?Medications: ?Outpatient Encounter Medications as of 10/26/2021  ?Medication Sig  ? amLODipine (NORVASC) 5 MG tablet Take 1 tablet (5 mg total) by mouth at bedtime.  ? aspirin EC 81 MG EC tablet Take 1 tablet (81 mg total) by mouth daily. Swallow whole.  ? blood glucose meter kit and supplies KIT Dispense based on patient and insurance preference. Check blood sugars 4 times daily E11.69  ? Continuous Blood Gluc Receiver (FREESTYLE LIBRE READER) DEVI 1 each by Does not apply route See admin instructions. CGM device-Freestyle Libre  ? Continuous Blood Gluc Sensor (FREESTYLE LIBRE 14 DAY SENSOR) MISC 1 each by Does not apply route See admin instructions. CGM 14-day sensors; send refills to [facility] (in [place])  ? Finerenone (KERENDIA) 10 MG TABS Take 1 tablet by mouth daily.  ? HUMALOG 100 UNIT/ML injection Inject 0.1 mLs (10 Units total) into the skin 3 (three) times daily with meals.  ? insulin degludec (TRESIBA FLEXTOUCH) 100 UNIT/ML FlexTouch Pen Inject 27 Units into the skin daily.  ? Insulin Pen Needle (BD PEN NEEDLE MICRO U/F) 32G X 6 MM  MISC DX:E11.65 USE TO CHECK BLOOD SUGARS THREE TIMES A DAY  ? Lancets Misc. MISC 1 each by Does not apply route 4 (four) times daily -  with meals and at bedtime.  ? linaclotide (LINZESS) 145 MCG CAPS capsule Take 1 capsule (145 mcg total) by mouth daily before breakfast.  ? Multiple Vitamin (MULTIVITAMIN WITH MINERALS) TABS tablet Take 1 tablet by mouth daily.  ? Multiple Vitamin (MULTIVITAMIN) tablet Take 1 tablet by mouth daily.  ? nystatin cream (MYCOSTATIN) Apply to affected area 2 times daily  ? ONETOUCH VERIO test strip USE TO test blood sugar FOUR TIMES DAILY  ? pantoprazole (PROTONIX) 40 MG tablet Take 1 tablet (40 mg total) by mouth daily.  ? pravastatin (PRAVACHOL) 40 MG tablet TAKE ONE TABLET BY MOUTH EVERYDAY AT BEDTIME  ? pregabalin (LYRICA) 50 MG capsule TAKE ONE CAPSULE BY MOUTH BEFORE BREAKFAST and TAKE ONE CAPSULE BY MOUTH EVERY EVENING and TAKE ONE CAPSULE BY MOUTH EVERYDAY AT BEDTIME  ? Semaglutide,0.25 or 0.5MG/DOS, (OZEMPIC, 0.25 OR 0.5 MG/DOSE,) 2 MG/1.5ML SOPN Inject 0.5 mg into the skin once a week.  ? Vibegron (GEMTESA) 75 MG TABS Take 75 mg by mouth daily.  ? ?No facility-administered encounter medications on file as of 10/26/2021.  ?Recent Relevant Labs: ?Lab Results  ?Component Value Date/Time  ? HGBA1C 10.1 (H) 09/26/2021 02:44 PM  ? HGBA1C 11.2 (H) 06/20/2021 10:49 AM  ? MICROALBUR 80 12/13/2020 05:01 PM  ? MICROALBUR 80 12/31/2019 03:19 PM  ?  ?Kidney Function ?Lab Results  ?Component Value Date/Time  ? CREATININE  1.22 (H) 09/26/2021 02:44 PM  ? CREATININE 1.19 (H) 04/13/2021 03:04 PM  ? GFR 51.07 (L) 05/17/2017 08:54 AM  ? GFRNONAA 59 (L) 12/01/2020 08:59 AM  ? GFRAA 57 (L) 07/07/2020 05:11 PM  ? ? ?Current antihyperglycemic regimen:  ?Humalog 100 unit/ml - inject 0.1 mls in the skin three times per day with meals. ?Tresiba 100 unit/ml- 27 units daily. ?Ozempic 0.5 mg weekly ?Kerendia 10 mg daily ? ?What recent interventions/DTPs have been made to improve glycemic control:  ?Educated  on A1c and blood sugar goals; ?Complications of diabetes including kidney damage, retinal damage, and cardiovascular disease; ?-Counseled to check feet daily and get yearly eye exams ?-Counseled on diet and exercise extensively ?Recommended to continue current medication, collaborate with PCP team to start patient back on Ozempic.  ? ?Have there been any recent hospitalizations or ED visits since last visit with CPP? No ? ?Patient denies hypoglycemic symptoms ? ?Patient denies hyperglycemic symptoms ? ?How often are you checking your blood sugar? 3-4 times daily ?What are your blood sugars ranging?  ?Fasting: 128, 133 ?Before meals: 164, 159 ?After meals: 178,137 ?Bedtime: 181, 171 ? ?During the week, how often does your blood glucose drop below 70? Never ? ?Are you checking your feet daily/regularly? Daily ? ?Adherence Review: ?Is the patient currently on a STATIN medication? Yes ?Is the patient currently on ACE/ARB medication? No ?Does the patient have >5 day gap between last estimated fill dates? No ? ?Care Gaps: ?Yearly Ophthalmology exam overdue ?Shingrix overdue ?Covid Booster overdue ?AWV 01-24-2022 ? ?Star Rating Drugs: ?Pravastatin 40 mg- Last filled 10-24-2021 30 DS [facility] ?Ozempic 0.5 mg- Last filled 09-04-2021 84 DS ? ?Malecca Hicks CMA ?Clinical Pharmacist Assistant ?[phone] ?

## 2021-10-30 DIAGNOSIS — E1140 Type 2 diabetes mellitus with diabetic neuropathy, unspecified: Secondary | ICD-10-CM | POA: Diagnosis not present

## 2021-10-30 DIAGNOSIS — N189 Chronic kidney disease, unspecified: Secondary | ICD-10-CM | POA: Diagnosis not present

## 2021-10-30 DIAGNOSIS — Z794 Long term (current) use of insulin: Secondary | ICD-10-CM | POA: Diagnosis not present

## 2021-10-30 DIAGNOSIS — E1165 Type 2 diabetes mellitus with hyperglycemia: Secondary | ICD-10-CM | POA: Diagnosis not present

## 2021-10-30 DIAGNOSIS — Z7985 Long-term (current) use of injectable non-insulin antidiabetic drugs: Secondary | ICD-10-CM | POA: Diagnosis not present

## 2021-10-30 DIAGNOSIS — E1122 Type 2 diabetes mellitus with diabetic chronic kidney disease: Secondary | ICD-10-CM | POA: Diagnosis not present

## 2021-10-30 DIAGNOSIS — I129 Hypertensive chronic kidney disease with stage 1 through stage 4 chronic kidney disease, or unspecified chronic kidney disease: Secondary | ICD-10-CM | POA: Diagnosis not present

## 2021-11-01 ENCOUNTER — Ambulatory Visit (INDEPENDENT_AMBULATORY_CARE_PROVIDER_SITE_OTHER): Payer: Medicare Other

## 2021-11-01 ENCOUNTER — Telehealth: Payer: Medicare Other

## 2021-11-01 DIAGNOSIS — E1165 Type 2 diabetes mellitus with hyperglycemia: Secondary | ICD-10-CM

## 2021-11-01 DIAGNOSIS — E782 Mixed hyperlipidemia: Secondary | ICD-10-CM

## 2021-11-01 DIAGNOSIS — I10 Essential (primary) hypertension: Secondary | ICD-10-CM

## 2021-11-01 DIAGNOSIS — Z9889 Other specified postprocedural states: Secondary | ICD-10-CM

## 2021-11-03 NOTE — Chronic Care Management (AMB) (Cosign Needed)
Chronic Care Management   CCM RN Visit Note  11/01/2021 Name: [name] MRN: [id number] DOB: Oct 26, 1950  Subjective: [name] is a 71 y.o. year old female who is a primary care patient of [name], [place]. The care management team was consulted for assistance with disease management and care coordination needs.    Engaged with patient by telephone for follow up visit in response to provider referral for case management and/or care coordination services.   Consent to Services:  The patient was given information about Chronic Care Management services, agreed to services, and gave verbal consent prior to initiation of services.  Please see initial visit note for detailed documentation.   Patient agreed to services and verbal consent obtained.   Assessment: Review of patient past medical history, allergies, medications, health status, including review of consultants reports, laboratory and other test data, was performed as part of comprehensive evaluation and provision of chronic care management services.   SDOH (Social Determinants of Health) assessments and interventions performed:  Yes, no acute challenges   CCM Care Plan  Allergies  Allergen Reactions   Pollen Extract Other (See Comments)    Congestion/sneezing   Tomato Hives and Itching   Codeine Hives, Itching and Nausea And Vomiting    Outpatient Encounter Medications as of 11/01/2021  Medication Sig   amLODipine (NORVASC) 5 MG tablet Take 1 tablet (5 mg total) by mouth at bedtime.   aspirin EC 81 MG EC tablet Take 1 tablet (81 mg total) by mouth daily. Swallow whole.   blood glucose meter kit and supplies KIT Dispense based on patient and insurance preference. Check blood sugars 4 times daily E11.69   Continuous Blood Gluc Receiver (FREESTYLE LIBRE READER) DEVI 1 each by Does not apply route See admin instructions. CGM device-Freestyle Libre   Continuous Blood Gluc Sensor (FREESTYLE LIBRE 14 DAY SENSOR)  MISC 1 each by Does not apply route See admin instructions. CGM 14-day sensors; send refills to [facility] (in [place])   Finerenone (KERENDIA) 10 MG TABS Take 1 tablet by mouth daily.   HUMALOG 100 UNIT/ML injection Inject 0.1 mLs (10 Units total) into the skin 3 (three) times daily with meals.   insulin degludec (TRESIBA FLEXTOUCH) 100 UNIT/ML FlexTouch Pen Inject 27 Units into the skin daily.   Insulin Pen Needle (BD PEN NEEDLE MICRO U/F) 32G X 6 MM MISC DX:E11.65 USE TO CHECK BLOOD SUGARS THREE TIMES A DAY   Lancets Misc. MISC 1 each by Does not apply route 4 (four) times daily -  with meals and at bedtime.   linaclotide (LINZESS) 145 MCG CAPS capsule Take 1 capsule (145 mcg total) by mouth daily before breakfast.   Multiple Vitamin (MULTIVITAMIN WITH MINERALS) TABS tablet Take 1 tablet by mouth daily.   Multiple Vitamin (MULTIVITAMIN) tablet Take 1 tablet by mouth daily.   nystatin cream (MYCOSTATIN) Apply to affected area 2 times daily   ONETOUCH VERIO test strip USE TO test blood sugar FOUR TIMES DAILY   pantoprazole (PROTONIX) 40 MG tablet Take 1 tablet (40 mg total) by mouth daily.   pravastatin (PRAVACHOL) 40 MG tablet TAKE ONE TABLET BY MOUTH EVERYDAY AT BEDTIME   pregabalin (LYRICA) 50 MG capsule TAKE ONE CAPSULE BY MOUTH BEFORE BREAKFAST and TAKE ONE CAPSULE BY MOUTH EVERY EVENING and TAKE ONE CAPSULE BY MOUTH EVERYDAY AT BEDTIME   Semaglutide,0.25 or 0.5MG/DOS, (OZEMPIC, 0.25 OR 0.5 MG/DOSE,) 2 MG/1.5ML SOPN Inject 0.5 mg into the skin once a week.  Vibegron (GEMTESA) 75 MG TABS Take 75 mg by mouth daily.   No facility-administered encounter medications on file as of 11/01/2021.    Patient Active Problem List   Diagnosis Date Noted   Abdominal bloating 07/03/2021   Chronic idiopathic constipation 07/03/2021   Colon cancer screening 07/03/2021   Diverticulosis of large intestine without perforation or abscess without bleeding 07/03/2021   Dysphagia 07/03/2021   Flatulence,  eructation and gas pain 07/03/2021   Hiatal hernia 07/03/2021   Quadriceps weakness 02/06/2021   Syncope 11/29/2020   Mild aortic stenosis 11/29/2020   Irritable bowel syndrome with diarrhea 10/10/2020   Gastroesophageal reflux disease with esophagitis 10/10/2020   Postlaminectomy syndrome, not elsewhere classified 07/03/2020   S/P lumbar laminectomy 03/30/2020   Localized swelling, mass and lump, multiple sites 03/27/2018   Type II diabetes mellitus with renal manifestations ([place]) 12/27/2017   HLD (hyperlipidemia) 12/27/2017   HTN (hypertension) 12/27/2017   CKD (chronic kidney disease), stage III ([place]) 12/27/2017   Cough variant asthma 12/01/2015   Osteoarthritis of left knee, primary localized 08/17/2014   Knee osteoarthritis 08/17/2014   Hypoglycemia 11/15/2013   Other and unspecified hyperlipidemia 08/06/2013   Type II diabetes mellitus, uncontrolled 07/20/2013   Varicose veins of lower extremities with other complications 16/08/930   Fibromyalgia    GERD 05/18/2010   ABDOMINAL PAIN, GENERALIZED 05/02/2010   OBESITY 04/20/2010   BURSITIS, LEFT SHOULDER 03/09/2010   Lipoma of arm s/p excision 01/29/2014 01/10/2010   DEPRESSION 12/27/2009   BACK PAIN WITH RADICULOPATHY 12/27/2009   CYSTITIS, ACUTE 10/21/2009   MICROSCOPIC HEMATURIA 10/18/2009   KNEE PAIN, BILATERAL 10/18/2009   NEUROPATHY 09/08/2009   Asthma 09/08/2009   STRESS INCONTINENCE 09/08/2009   CHEST PAIN 07/19/2009   ESOPHAGEAL STRICTURE 08/27/2005    Conditions to be addressed/monitored: DM, HTN, HLD, s/p laminectomy   Care Plan : RN Care Manager Plan of Care  Updates made by [name], RN since 11/01/2021 12:00 AM     Problem: No plan established for management of chronic disease states (DM, HTN, HLD, s/p laminectomy)   Priority: High     Long-Range Goal: Development of plan of care for chronic disease management (DM, HTN, HLD, s/p laminectomy)   Start Date: 07/24/2021  Expected End Date:  07/24/2022  Recent Progress: On track  Priority: High  Note:   Current Barriers:  Knowledge Deficits related to plan of care for management of DM, HTN, HLD, s/p laminectomy   Chronic Disease Management support and education needs related to DM, HTN, HLD, s/p laminectomy    RNCM Clinical Goal(s):  Patient will verbalize basic understanding of  DM, HTN, HLD, s/p laminectomy  disease process and self health management plan as evidenced by patient will experience no disease exacerbations related to chronic disease states take all medications exactly as prescribed and will call provider for medication related questions as evidenced by patient will report having no missed doses of prescribed medications demonstrate Improved health management independence as evidenced by patient will continue to stay independent with self care continue to work with RN Care Manager to address care management and care coordination needs related to  DM, HTN, HLD, s/p laminectomy  as evidenced by adherence to CM Team Scheduled appointments demonstrate ongoing self health care management ability   as evidenced by    through collaboration with RN Care manager, provider, and care team.   Interventions: 1:1 collaboration with primary care provider regarding development and update of comprehensive plan of care as evidenced by  provider attestation and co-signature Inter-disciplinary care team collaboration (see longitudinal plan of care) Evaluation of current treatment plan related to DM, HTN, HLD, s/p laminectomy self management and patient's adherence to plan as established by provider   Diabetes Interventions:  (Status:  Goal on track:  Yes.) Long Term Goal Assessed patient's understanding of A1c goal: <7% Review of patient status, including review of consultant's reports, relevant laboratory and other test results, and medications completed Discussed and reviewed recent Endocrinology follow up with Gery Pray FNP  completed on 10/30/21 with the following Assessment/Plan noted: Assessment: Type 2 DM In summary, 70 y.o. female with has no past medical history on file.here for follow up of Diabetes mellitus. Reviewed recent A1c 9.8% with patient. Goal A1c 7% or less with hypoglycemia avoidance. Reviewed recent labs with patient that suggest type 2 DM. Reviewed Libre CGM download with patient. Plan for patient to start administering prandial insulin prior to meals. Plan: Diabetes Mellitus -  A1c target <7% with hypoglycemia avoidance Continue Ozempic 0.5 mg weekly  Continue Tresiba 27 units in the morning  Continue Humalog 8 units BEFORE meals (if having low carb meal give 4 units) plus correction scale  150-200 give 2 units  >201-300 give 3 units >400 give 4 units  Bring Libre reader to follow up  Please call or message if BG is consistently less than 70 or greater than 300   Follow up in 3 months  Reviewed medications with patient and discussed importance of medication adherence Advised patient, providing education and rationale, to check cbg daily before meals and at bedtime, use Libre scanner as directed  and record, calling Gery Pray FNP  for findings outside established parameters Reinforced importance of adhering to dietary and exercise recommendations Mailed printed educational materials related to Carb Choices; Full Body Work Out with 6 Chair Exercises Lab Results  Component Value Date   HGBA1C 10.1 (H) 09/26/2021      Chronic Kidney Disease Interventions:  (Status:  Goal on track:  Yes.) Long Term Goal Assessed the Patient understanding of chronic kidney disease    Evaluation of current treatment plan related to chronic kidney disease self management and patient's adherence to plan as established by provider      Reviewed prescribed diet continue to increase water to 48-64 oz daily unless otherwise directed  Provided education on kidney disease progression    Last practice recorded BP  readings:  BP Readings from Last 3 Encounters:  09/26/21 (!) 142/98  06/20/21 130/82  05/30/21 (!) 168/97  Most recent eGFR/CrCl:  Lab Results  Component Value Date   EGFR 48 (L) 09/26/2021    No components found for: CRCL  Patient Goals/Self-Care Activities: Take all medications as prescribed Attend all scheduled provider appointments Call pharmacy for medication refills 3-7 days in advance of running out of medications Attend church or other social activities Perform all self care activities independently  Perform IADL's (shopping, preparing meals, housekeeping, managing finances) independently Call provider office for new concerns or questions  schedule appointment with eye doctor check feet daily for cuts, sores or redness enter blood sugar readings and medication or insulin into daily log drink 6 to 8 glasses of water each day manage portion size  Follow Up Plan:  Telephone follow up appointment with care management team member scheduled for:  /23     [name], RN, BSN, CCM Care Management Coordinator [place] [facility]  Direct Phone: [phone]

## 2021-11-03 NOTE — Patient Instructions (Signed)
Visit Information ? ?Thank you for taking time to visit with me today. Please don't hesitate to contact me if I can be of assistance to you before our next scheduled telephone appointment. ? ?Following are the goals we discussed today:  ?(Copy and paste patient goals from clinical care plan here) ? ?Our next appointment is by telephone on 01/26/22 at 10:30 AM  ? ?Please call the care guide team at [phone] if you need to cancel or reschedule your appointment.  ? ?If you are experiencing a Mental Health or [place] or need someone to talk to, please call [phone]-TALK (toll free, 24 hour hotline)  ? ?Patient verbalizes understanding of instructions and care plan provided today and agrees to view in [place]. Active MyChart status confirmed with patient.   ? ?[name], RN, BSN, CCM ?Care Management Coordinator ?[place] [facility]  ?Direct Phone: [phone] ? ? ?

## 2021-11-07 ENCOUNTER — Telehealth: Payer: Self-pay

## 2021-11-07 NOTE — Chronic Care Management (AMB) (Signed)
? ?  DANIEL JOHNDROW was reminded to have all medications, supplements and any blood glucose and blood pressure readings available for review with [name], Pharm. D, at her telephone visit on 11-10-2021 at 10:00. ? ? ? ?Jeannette How CMA ?Clinical Pharmacist Assistant ?[phone] ? ?

## 2021-11-10 ENCOUNTER — Ambulatory Visit: Payer: Medicare Other

## 2021-11-10 DIAGNOSIS — E118 Type 2 diabetes mellitus with unspecified complications: Secondary | ICD-10-CM | POA: Diagnosis not present

## 2021-11-10 DIAGNOSIS — Z794 Long term (current) use of insulin: Secondary | ICD-10-CM | POA: Diagnosis not present

## 2021-11-10 DIAGNOSIS — E782 Mixed hyperlipidemia: Secondary | ICD-10-CM

## 2021-11-10 DIAGNOSIS — I10 Essential (primary) hypertension: Secondary | ICD-10-CM

## 2021-11-10 DIAGNOSIS — E1165 Type 2 diabetes mellitus with hyperglycemia: Secondary | ICD-10-CM

## 2021-11-10 NOTE — Progress Notes (Addendum)
? ?Chronic Care Management ?Pharmacy Note ? ?11/17/2021 ?Name:  [name] MRN:  [id number] DOB:  1951-08-19 ? ?Summary: ?Patient reports that she is doing better with medication  ? ?Recommendations/Changes made from today's visit: ?1. Recommend patients Pravastatin be changed to Atorvastatin 20 mg tablet  ?2. Recommend patient be started on Valsartan 80 mg tablet and do BMP in two weeks.  ?3. Recommend patients Ozempic be increased to 1 mg once per week.  ? ?Plan: ?Patient reports that she is okay with these recommendations. Will collaborate with PCP to make changes.  ? ? ? ?Subjective: ?DANAIJA [name] is an 71 y.o. year old female who is a primary patient of [name], [place].  The CCM team was consulted for assistance with disease management and care coordination needs.   ? ?Engaged with patient by telephone for follow up visit in response to provider referral for pharmacy case management and/or care coordination services.  ? ?Consent to Services:  ?The patient was given information about Chronic Care Management services, agreed to services, and gave verbal consent prior to initiation of services.  Please see initial visit note for detailed documentation.  ? ?Patient Care Team: ?[name], FNP as PCP - General (General Practice) ?Elayne Snare, MD as Consulting Physician (Endocrinology) ?Bjorn Loser, MD as Consulting Physician (Urology) ?Juanita Craver, MD as Consulting Physician (Gastroenterology) ?Little, Claudette Stapler, RN as Case Manager ?Mayford Knife, RPH (Pharmacist) ? ?Recent office visits: ?09/26/2021 PCP OV ? ?Recent consult visits: ?10/30/2021 Endocrinology OV ? ?Hospital visits: ?None in previous 6 months ? ? ?Objective: ? ?Lab Results  ?Component Value Date  ? CREATININE 1.22 (H) 09/26/2021  ? BUN 17 09/26/2021  ? GFR 51.07 (L) 05/17/2017  ? EGFR 48 (L) 09/26/2021  ? GFRNONAA 59 (L) 12/01/2020  ? GFRAA 57 (L) 07/07/2020  ? NA 143 09/26/2021  ? K 4.3 09/26/2021  ? CALCIUM 9.8 09/26/2021   ? CO2 26 09/26/2021  ? GLUCOSE 79 09/26/2021  ? ? ?Lab Results  ?Component Value Date/Time  ? HGBA1C 10.1 (H) 09/26/2021 02:44 PM  ? HGBA1C 11.2 (H) 06/20/2021 10:49 AM  ? FRUCTOSAMINE 388 (H) 05/17/2017 08:54 AM  ? FRUCTOSAMINE 286 (H) 07/20/2014 10:07 AM  ? GFR 51.07 (L) 05/17/2017 08:54 AM  ? GFR 64.19 02/20/2017 11:52 AM  ? MICROALBUR 80 12/13/2020 05:01 PM  ? MICROALBUR 80 12/31/2019 03:19 PM  ?  ?Last diabetic Eye exam: No results found for: HMDIABEYEEXA  ?Last diabetic Foot exam: No results found for: HMDIABFOOTEX  ? ?Lab Results  ?Component Value Date  ? CHOL 190 09/26/2021  ? HDL 55 09/26/2021  ? LDLCALC 123 (H) 09/26/2021  ? LDLDIRECT 152.8 10/09/2013  ? TRIG 65 09/26/2021  ? CHOLHDL 3.5 09/26/2021  ? ? ? ?  Latest Ref Rng & Units 09/26/2021  ?  2:44 PM 04/13/2021  ?  3:04 PM 07/07/2020  ?  5:11 PM  ?Hepatic Function  ?Total Protein 6.0 - 8.5 g/dL 7.2   6.1   7.0    ?Albumin 3.8 - 4.8 g/dL 3.8   3.6   4.2    ?AST 0 - 40 IU/L 18   11   19     ?ALT 0 - 32 IU/L 16   6   17     ?Alk Phosphatase 44 - 121 IU/L 121   108   107    ?Total Bilirubin 0.0 - 1.2 mg/dL 0.3   0.6   0.3    ? ? ?Lab Results  ?  Component Value Date/Time  ? TSH 1.990 06/09/2018 12:42 PM  ? TSH 2.047 05/31/2010 11:11 PM  ? ? ? ?  Latest Ref Rng & Units 04/13/2021  ?  3:04 PM 12/13/2020  ?  3:32 PM 12/01/2020  ?  8:59 AM  ?CBC  ?WBC 3.4 - 10.8 x10E3/uL 5.9   5.7   4.3    ?Hemoglobin 11.1 - 15.9 g/dL 13.3   15.1   13.9    ?Hematocrit 34.0 - 46.6 % 41.3   46.5   42.7    ?Platelets 150 - 450 x10E3/uL 274   289   238    ? ? ?Lab Results  ?Component Value Date/Time  ? VD25OH 117.0 (H) 09/26/2021 02:44 PM  ? VD25OH 34.4 05/03/2020 02:55 PM  ? ? ?Clinical ASCVD: No  ?The 10-year ASCVD risk score (Arnett DK, et al., 2019) is: 43% ?  Values used to calculate the score: ?    Age: 71 years ?    Sex: Female ?    Is Non-Hispanic African American: Yes ?    Diabetic: Yes ?    Tobacco smoker: No ?    Systolic Blood Pressure: 196 mmHg ?    Is BP treated: Yes ?    HDL  Cholesterol: 55 mg/dL ?    Total Cholesterol: 190 mg/dL   ? ? ?  01/11/2021  ? 11:45 AM 12/31/2019  ?  2:17 PM 10/22/2019  ?  3:33 PM  ?Depression screen PHQ 2/9  ?Decreased Interest 0 0 0  ?Down, Depressed, Hopeless 0 0 0  ?PHQ - 2 Score 0 0 0  ?Altered sleeping  0   ?Tired, decreased energy  0   ?Change in appetite  0   ?Feeling bad or failure about yourself   0   ?Trouble concentrating  0   ?Moving slowly or fidgety/restless  0   ?Suicidal thoughts  0   ?PHQ-9 Score  0   ?Difficult doing work/chores  Not difficult at all   ?  ? ? ?Social History  ? ?Tobacco Use  ?Smoking Status Never  ?Smokeless Tobacco Never  ? ?BP Readings from Last 3 Encounters:  ?09/26/21 (!) 142/98  ?06/20/21 130/82  ?05/30/21 (!) 168/97  ? ?Pulse Readings from Last 3 Encounters:  ?09/26/21 98  ?06/20/21 81  ?05/30/21 84  ? ?Wt Readings from Last 3 Encounters:  ?09/26/21 173 lb 9.6 oz (78.7 kg)  ?06/20/21 166 lb 6.4 oz (75.5 kg)  ?05/03/21 159 lb 3.2 oz (72.2 kg)  ? ?BMI Readings from Last 3 Encounters:  ?09/26/21 31.75 kg/m?  ?06/20/21 30.43 kg/m?  ?05/03/21 29.12 kg/m?  ? ? ?Assessment/Interventions: Review of patient past medical history, allergies, medications, health status, including review of consultants reports, laboratory and other test data, was performed as part of comprehensive evaluation and provision of chronic care management services.  ? ?SDOH:  (Social Determinants of Health) assessments and interventions performed: No ? ?SDOH Screenings  ? ?Alcohol Screen: Not on file  ?Depression (PHQ2-9): Low Risk   ? PHQ-2 Score: 0  ?Financial Resource Strain: Low Risk   ? Difficulty of Paying Living Expenses: Not hard at all  ?Food Insecurity: No Food Insecurity  ? Worried About Charity fundraiser in the Last Year: Never true  ? Ran Out of Food in the Last Year: Never true  ?Housing: Not on file  ?Physical Activity: Inactive  ? Days of Exercise per Week: 0 days  ? Minutes of Exercise per Session: 0 min  ?  Social Connections: Not on file   ?Stress: No Stress Concern Present  ? Feeling of Stress : Not at all  ?Tobacco Use: Low Risk   ? Smoking Tobacco Use: Never  ? Smokeless Tobacco Use: Never  ? Passive Exposure: Not on file  ?Transportation Needs: No Transportation Needs  ? Lack of Transportation (Medical): No  ? Lack of Transportation (Non-Medical): No  ? ? ?CCM Care Plan ? ?Allergies  ?Allergen Reactions  ? Pollen Extract Other (See Comments)  ?  Congestion/sneezing  ? Tomato Hives and Itching  ? Codeine Hives, Itching and Nausea And Vomiting  ? ? ?Medications Reviewed Today   ? ? Reviewed by Mayford Knife, RPH (Pharmacist) on 11/10/21 at 1026  Med List Status: <None>  ? ?Medication Order Taking? Sig Documenting Provider Last Dose Status Informant  ?amLODipine (NORVASC) 5 MG tablet [id number] No Take 1 tablet (5 mg total) by mouth at bedtime. [name], FNP Taking Active   ?aspirin EC 81 MG EC tablet [id number] No Take 1 tablet (81 mg total) by mouth daily. Swallow whole. Elmarie Shiley, MD Taking Active   ?blood glucose meter kit and supplies KIT [id number] No Dispense based on patient and insurance preference. Check blood sugars 4 times daily E11.69 [name], FNP Taking Active   ?Continuous Blood Gluc Receiver (FREESTYLE LIBRE READER) DEVI [id number] No 1 each by Does not apply route See admin instructions. CGM device-Freestyle Elenor Legato [provider] Taking Active Self  ?Continuous Blood Gluc Sensor (FREESTYLE LIBRE 14 DAY SENSOR) [place] [id number] No 1 each by Does not apply route See admin instructions. CGM 14-day sensors; send refills to [facility] (in [place]) [provider] Taking Active Self  ?Finerenone (KERENDIA) 10 MG TABS [id number]  Take 1 tablet by mouth daily. [name], FNP  Active   ?HUMALOG 100 UNIT/ML injection [id number] No Inject 0.1 mLs (10 Units total) into the skin 3 (three) times daily with meals. [name], FNP Taking Active   ?insulin degludec (TRESIBA FLEXTOUCH) 100 UNIT/ML FlexTouch  Pen [id number] No Inject 27 Units into the skin daily. [name], FNP Taking Active   ?Insulin Pen Needle (BD PEN NEEDLE MICRO U/F) 32G X 6 MM MISC [id number] No DX:E11.65 USE TO CHECK BLOOD SUGARS TH

## 2021-11-17 NOTE — Patient Instructions (Addendum)
Visit Information ?It was great speaking with you today!  Please let me know if you have any questions about our visit. ? ? Goals Addressed   ? ?  ?  ?  ?  ? This Visit's Progress  ?  Manage My Medicine     ?  Timeframe:  Long-Range Goal ?Priority:  High ?Start Date:                             ?Expected End Date:                      ? ?Follow Up Date 12/15/2021 ? ?In Progress:  ?  ?- call for medicine refill 2 or 3 days before it runs out ?- call if I am sick and can't take my medicine ?- keep a list of all the medicines I take; vitamins and herbals too ?- learn to read medicine labels ?- use a pillbox to sort medicine ?- use an alarm clock or phone to remind me to take my medicine  ?  ?Why is this important?   ?These steps will help you keep on track with your medicines. ?Please call if you have any questions about your medications.  ?  ? ?  ? ?  ? ?Patient Care Plan: [place]  ?  ? ?Problem Identified: HTN, DM II, HLD   ?Priority: High  ?  ? ?Long-Range Goal: Disease Management   ?Recent Progress: On track  ?Note:   ? ?Current Barriers:  ?Unable to independently monitor therapeutic efficacy ? ?Pharmacist Clinical Goal(s):  ?Patient will achieve adherence to monitoring guidelines and medication adherence to achieve therapeutic efficacy through collaboration with PharmD and provider.  ? ?Interventions: ?1:1 collaboration with [name], FNP regarding development and update of comprehensive plan of care as evidenced by provider attestation and co-signature ?Inter-disciplinary care team collaboration (see longitudinal plan of care) ?Comprehensive medication review performed; medication list updated in electronic medical record ? ?Hypertension (BP goal <130/80) ?-Not ideally controlled ?-Current treatment: ?Amlodipine 5 mg tablet daily - taking 2 tablets daily  Appropriate, Query effective ?-Medications previously tried: [place], Metformin  ?-Current home readings: in the mornings her BP are 137 -  129/ 73-80 127/80 - she is taking her BP twice a day 128/70 ?-Current dietary habits: discussed in detail in diabetes  ?-Current exercise habits: please see diabetes  ?-Reports hypotensive/hypertensive symptoms ?-Educated on BP goals and benefits of medications for prevention of heart attack, stroke and kidney damage; ?Daily salt intake goal < 2300 mg; ?-Counseled to monitor BP at home at least twice per day, document, and provide log at future appointments ?-Collaborated with PCP to add Valsartan 80 mg tablet once per day , start patient, and have her come back in two weeks for BMP lab to be completed  ? ?Hyperlipidemia: (LDL goal < 70) ?-Uncontrolled ?-Current treatment: ?Pravastatin 40 mg tablet once per day Appropriate, Query effective ?-Current dietary patterns: please see diabetes  ?-[name] is in packaging, she has 23 pills left, she was able to describe the medication, TEVA [id number]. She reports feeling comfortable taking the medication out of packaging and starting Atorvastatin  ?-Her packaging will not run out until the 12/02/2021,  ?-Educated on Cholesterol goals;  ?-Collaborated with PCP team to discontinue Pravastatin, start Atorvastatin 20 mg tablet daily. Patient to have labs completed in 6 weeks.  ? ?Diabetes (A1c goal <7%) ?-Uncontrolled ?-Current medications: ?[place]  100 unit/ml - inject 27 units into the skin daily Appropriate, Effective, Safe, Accessible ?Ozempic 0.5 mg - inject once per week Appropriate, Effective, Safe, Accessible ?Humalog - inject 10 units into the skin  three times per day with a meal Appropriate, Effective, Safe, Accessible ?per endocrinologist she can do a sliding scale if between 150-199 - 2 units, and its based on the number of carbs that she eats.  ?Will see endocrinologist in June ?-Medications previously tried: [place], Metformin   ?-Current home glucose readings - she has not had any readings of 200 in the past couple of weeks -she is checking it 4-5 times per day  ? ?  Target Date(s)  ?Number of days worn ? 14 days   ?% of time active ? 70% 45%  ?Mean Glucose (mg/dL)  168  ?GMI (2-week A1c estimate) ?= 3.31 + 0.02392 x [mean glucose in mg/dL]  7.3  ?Time above 70-180 mg/dL <25% 61%  ?Time in range: 70-180 mg/dL >70% 61%  ?Time below 70 mg/dL <4% 0  ?Time below 54 mg/dL <1% 0  ?  ?-Denies hypoglycemic/hyperglycemic symptoms ? -She is no longer having low BS readings  ?-Current meal patterns:  ?breakfast: regular oatmeal    ?lunch: vegetables and protein   ?dinner: vegetables and protein   ?drinks: she has increased the amount of water that she is drinking.  ?She is drinking now at least five bottles of water  ?Congratulated [name] on reducing the amount of sweet tea she is drinking  ?-Current exercise: She is going to the [facility] three times per week. She sometimes goes 4-5 times per week. She also does walking around the neighborhood. She is usually exercising for 45 minutes- 60 minutes. She is walking on the treadmill, gliding, bicycle or using the rope. She also sometimes exercises in the pool.  ?-Educated on Benefits of weight loss; ?-Counseled to check feet daily and get yearly eye exams ?-Has been seen by the foot doctor this year and the eye doctor - appointment completed at [facility], this  year.  ?-She has appointment with an additional eye doctor on 11/21/2021   with Dr. Gershon Crane to make sure that there is no Glaucoma or other eye condition.  ?-Collaborated with PCP to increase patients Ozempic 1 mg. ? ?Patient Goals/Self-Care Activities ?Patient will:  ?- focus on medication adherence by continuing to check BS and  ? ?Follow Up Plan: The patient has been provided with contact information for the care management team and has been advised to call with any health related questions or concerns.  ?  ? ? ?Patient agreed to services and verbal consent obtained.  ? ?The patient verbalized understanding of instructions, educational materials, and care plan provided today and  agreed to receive a mailed copy of patient instructions, educational materials, and care plan.  ? ?[name], PharmD ?Clinical Pharmacist ?[facility] ?[phone] ?  ?

## 2021-11-21 ENCOUNTER — Telehealth: Payer: Self-pay

## 2021-11-21 NOTE — Chronic Care Management (AMB) (Signed)
? ? ?Chronic Care Management ?Pharmacy Assistant  ? ?Name: [name]  MRN: [id number] DOB: Nov 13, 1950 ? ?Reason for Encounter: Medication Review/ Medication coordination ? ?Recent office visits:  ?None ? ?Recent consult visits:  ?None ? ?Hospital visits:  ?Medication Reconciliation was completed by comparing discharge summary, patient?s EMR and Pharmacy list, and upon discussion with patient. ?  ?Admitted to the hospital on 05-30-2021 due to groin swelling. Discharge date was 05-30-2021 Discharged from [place] urgent care [facility].   ?  ?New?Medications Started at [facility] Discharge:?? ?Keflex 500 mg twice daily for 5 days ?Nystatin cream twice daily ?  ?Medication Changes at Hospital Discharge: ?None ?  ?Medications Discontinued at Hospital Discharge: ?None ?  ?Medications that remain the same after Hospital Discharge:??  ?-All other medications will remain the same.   ?  ? ?Medications: ?Outpatient Encounter Medications as of 11/21/2021  ?Medication Sig  ? amLODipine (NORVASC) 5 MG tablet Take 1 tablet (5 mg total) by mouth at bedtime.  ? aspirin EC 81 MG EC tablet Take 1 tablet (81 mg total) by mouth daily. Swallow whole.  ? blood glucose meter kit and supplies KIT Dispense based on patient and insurance preference. Check blood sugars 4 times daily E11.69  ? Continuous Blood Gluc Receiver (FREESTYLE LIBRE READER) DEVI 1 each by Does not apply route See admin instructions. CGM device-Freestyle Libre  ? Continuous Blood Gluc Sensor (FREESTYLE LIBRE 14 DAY SENSOR) MISC 1 each by Does not apply route See admin instructions. CGM 14-day sensors; send refills to [facility] (in [place])  ? Finerenone (KERENDIA) 10 MG TABS Take 1 tablet by mouth daily.  ? HUMALOG 100 UNIT/ML injection Inject 0.1 mLs (10 Units total) into the skin 3 (three) times daily with meals.  ? insulin degludec (TRESIBA FLEXTOUCH) 100 UNIT/ML FlexTouch Pen Inject 27 Units into the skin daily.  ? Insulin Pen Needle (BD PEN NEEDLE MICRO  U/F) 32G X 6 MM MISC DX:E11.65 USE TO CHECK BLOOD SUGARS THREE TIMES A DAY  ? Lancets Misc. MISC 1 each by Does not apply route 4 (four) times daily -  with meals and at bedtime.  ? linaclotide (LINZESS) 145 MCG CAPS capsule Take 1 capsule (145 mcg total) by mouth daily before breakfast.  ? Multiple Vitamin (MULTIVITAMIN WITH MINERALS) TABS tablet Take 1 tablet by mouth daily.  ? Multiple Vitamin (MULTIVITAMIN) tablet Take 1 tablet by mouth daily.  ? nystatin cream (MYCOSTATIN) Apply to affected area 2 times daily  ? ONETOUCH VERIO test strip USE TO test blood sugar FOUR TIMES DAILY  ? pantoprazole (PROTONIX) 40 MG tablet Take 1 tablet (40 mg total) by mouth daily.  ? pravastatin (PRAVACHOL) 40 MG tablet TAKE ONE TABLET BY MOUTH EVERYDAY AT BEDTIME  ? pregabalin (LYRICA) 50 MG capsule TAKE ONE CAPSULE BY MOUTH BEFORE BREAKFAST and TAKE ONE CAPSULE BY MOUTH EVERY EVENING and TAKE ONE CAPSULE BY MOUTH EVERYDAY AT BEDTIME  ? Semaglutide,0.25 or 0.5MG/DOS, (OZEMPIC, 0.25 OR 0.5 MG/DOSE,) 2 MG/1.5ML SOPN Inject 0.5 mg into the skin once a week.  ? Vibegron (GEMTESA) 75 MG TABS Take 75 mg by mouth daily.  ? ?No facility-administered encounter medications on file as of 11/21/2021.  ? ?Reviewed chart for medication changes ahead of medication coordination call. ? ?No OVs, Consults, or hospital visits since last care coordination call/Pharmacist visit.  ? ?No medication changes indicated OR if recent visit, treatment plan here. ? ?BP Readings from Last 3 Encounters:  ?09/26/21 (!) 142/98  ?06/20/21 130/82  ?  05/30/21 (!) 168/97  ?  ?Lab Results  ?Component Value Date  ? HGBA1C 10.1 (H) 09/26/2021  ?  ? ?Patient obtains medications through Adherence Packaging  30 Days  ? ?Last adherence delivery included:  ?Tresiba 25 units daily (Increased to 27 units) ?Pravastatin 40 mg 1 tablet daily (bedtime) ?Kerendia 10 mg daily at (breakfast) ?Women's 50 Plus Multivitamin 1 tablet daily (breakfast) ?Lyrica 50 mg 3 times daily 1 before  breakfast, 1 with evening meal and 1 at (bedtime).  ?Amlodipine 5 mg 1 tablet daily (bedtime) ?Pantoprazole 40 mg 1 tablet daily (breakfast) ?Ergocalciferol 1250 mcg 1 capsule weekly on Wednesdays (breakfast) ?Linzess 145 mcg 1 tablet daily(before breakfast) ? ?Patient declined (meds) last month: ?None ? ?Patient is due for next adherence delivery on: 04-062023 ? ?Called patient and reviewed medications and coordinated delivery. ? ?This delivery to include: ?Tresiba 25 units daily (Increased to 27 units) ?Pravastatin 40 mg 1 tablet daily (bedtime) ?Kerendia 10 mg daily at (breakfast) ?Women's 50 Plus Multivitamin 1 tablet daily (breakfast) ?Lyrica 50 mg 3 times daily 1 before breakfast, 1 with evening meal and 1 at (bedtime).  ?Amlodipine 5 mg 1 tablet daily (bedtime) ?Pantoprazole 40 mg 1 tablet daily (breakfast) ?Ergocalciferol 1250 mcg 1 capsule weekly on Wednesdays (breakfast) ?Linzess 145 mcg 1 tablet daily(before breakfast) ? ?No acute/short fill needed ? ?Patient declined the following medications: ?None ? ?Patient needs refills for: ?None ? ?Confirmed delivery date of 11-30-2021, advised patient that pharmacy will contact them the morning of delivery. ? ? ?Care Gaps: ?Yearly Ophthalmology exam overdue ?Shingrix overdue ?Covid Booster overdue ?AWV 01-24-2022 ? ?Star Rating Drugs: ?Ozempic 0.5 mg- Last filled 09-04-2021 [facility] ?Pravastatin 40 mg- Last filled 10-24-2021 [facility] ? ?Malecca Hicks CMA ?Clinical Pharmacist Assistant ?[phone] ? ?

## 2021-11-24 DIAGNOSIS — I10 Essential (primary) hypertension: Secondary | ICD-10-CM

## 2021-11-24 DIAGNOSIS — E782 Mixed hyperlipidemia: Secondary | ICD-10-CM

## 2021-11-24 DIAGNOSIS — E1165 Type 2 diabetes mellitus with hyperglycemia: Secondary | ICD-10-CM

## 2021-11-27 DIAGNOSIS — N3020 Other chronic cystitis without hematuria: Secondary | ICD-10-CM | POA: Diagnosis not present

## 2021-12-08 DIAGNOSIS — E113391 Type 2 diabetes mellitus with moderate nonproliferative diabetic retinopathy without macular edema, right eye: Secondary | ICD-10-CM | POA: Diagnosis not present

## 2021-12-08 LAB — HM DIABETES EYE EXAM

## 2021-12-10 DIAGNOSIS — E118 Type 2 diabetes mellitus with unspecified complications: Secondary | ICD-10-CM | POA: Diagnosis not present

## 2021-12-10 DIAGNOSIS — Z794 Long term (current) use of insulin: Secondary | ICD-10-CM | POA: Diagnosis not present

## 2021-12-15 ENCOUNTER — Telehealth: Payer: Medicare Other

## 2021-12-15 ENCOUNTER — Telehealth: Payer: Self-pay

## 2021-12-15 NOTE — Chronic Care Management (AMB) (Signed)
? ? ?  Called [name], No answer, left message of appointment on 12-19-2021 at 11:15 via telephone visit with [name], Pharm D. Notified to have all medications, supplements, blood pressure and/or blood sugar logs available during appointment and to return call if need to reschedule. ? ?Jeannette How CMA ?Clinical Pharmacist Assistant ?[phone] ? ? ?

## 2021-12-19 ENCOUNTER — Ambulatory Visit (INDEPENDENT_AMBULATORY_CARE_PROVIDER_SITE_OTHER): Payer: Medicare Other

## 2021-12-19 ENCOUNTER — Other Ambulatory Visit: Payer: Self-pay

## 2021-12-19 ENCOUNTER — Telehealth: Payer: Self-pay

## 2021-12-19 DIAGNOSIS — E1122 Type 2 diabetes mellitus with diabetic chronic kidney disease: Secondary | ICD-10-CM

## 2021-12-19 DIAGNOSIS — I10 Essential (primary) hypertension: Secondary | ICD-10-CM

## 2021-12-19 MED ORDER — PRAVASTATIN SODIUM 40 MG PO TABS: ORAL_TABLET | ORAL | 2 refills | Status: DC

## 2021-12-19 MED ORDER — LINACLOTIDE 145 MCG PO CAPS: 145.0000 ug | ORAL_CAPSULE | Freq: Every day | ORAL | 1 refills | Status: AC

## 2021-12-19 MED ORDER — AMLODIPINE BESYLATE 5 MG PO TABS: 5.0000 mg | ORAL_TABLET | Freq: Every day | ORAL | 1 refills | Status: DC

## 2021-12-19 NOTE — Chronic Care Management (AMB) (Signed)
Chronic Care Management Pharmacy Assistant   Name: [name]  MRN: [id number] DOB: August 13, 1951  Reason for Encounter: Medication Review/ Medication coordination  Recent office visits:  None  Recent consult visits:  None  Hospital visits:  None in previous 6 months  Medications: Outpatient Encounter Medications as of 12/19/2021  Medication Sig   amLODipine (NORVASC) 5 MG tablet Take 1 tablet (5 mg total) by mouth at bedtime.   aspirin EC 81 MG EC tablet Take 1 tablet (81 mg total) by mouth daily. Swallow whole.   blood glucose meter kit and supplies KIT Dispense based on patient and insurance preference. Check blood sugars 4 times daily E11.69   Continuous Blood Gluc Receiver (FREESTYLE LIBRE READER) DEVI 1 each by Does not apply route See admin instructions. CGM device-Freestyle Libre   Continuous Blood Gluc Sensor (FREESTYLE LIBRE 14 DAY SENSOR) MISC 1 each by Does not apply route See admin instructions. CGM 14-day sensors; send refills to [facility] (in Epic)   Finerenone (KERENDIA) 10 MG TABS Take 1 tablet by mouth daily.   HUMALOG 100 UNIT/ML injection Inject 0.1 mLs (10 Units total) into the skin 3 (three) times daily with meals.   insulin degludec (TRESIBA FLEXTOUCH) 100 UNIT/ML FlexTouch Pen Inject 27 Units into the skin daily.   Insulin Pen Needle (BD PEN NEEDLE MICRO U/F) 32G X 6 MM MISC DX:E11.65 USE TO CHECK BLOOD SUGARS THREE TIMES A DAY   Lancets Misc. MISC 1 each by Does not apply route 4 (four) times daily -  with meals and at bedtime.   linaclotide (LINZESS) 145 MCG CAPS capsule Take 1 capsule (145 mcg total) by mouth daily before breakfast.   Multiple Vitamin (MULTIVITAMIN WITH MINERALS) TABS tablet Take 1 tablet by mouth daily.   Multiple Vitamin (MULTIVITAMIN) tablet Take 1 tablet by mouth daily.   nystatin cream (MYCOSTATIN) Apply to affected area 2 times daily   ONETOUCH VERIO test strip USE TO test blood sugar FOUR TIMES DAILY   pantoprazole  (PROTONIX) 40 MG tablet Take 1 tablet (40 mg total) by mouth daily.   pravastatin (PRAVACHOL) 40 MG tablet TAKE ONE TABLET BY MOUTH EVERYDAY AT BEDTIME   pregabalin (LYRICA) 50 MG capsule TAKE ONE CAPSULE BY MOUTH BEFORE BREAKFAST and TAKE ONE CAPSULE BY MOUTH EVERY EVENING and TAKE ONE CAPSULE BY MOUTH EVERYDAY AT BEDTIME   Semaglutide,0.25 or 0.5MG/DOS, (OZEMPIC, 0.25 OR 0.5 MG/DOSE,) 2 MG/1.5ML SOPN Inject 0.5 mg into the skin once a week.   Vibegron (GEMTESA) 75 MG TABS Take 75 mg by mouth daily.   No facility-administered encounter medications on file as of 12/19/2021.   Reviewed chart for medication changes ahead of medication coordination call.  No OVs, Consults, or hospital visits since last care coordination call/Pharmacist visit.  No medication changes indicated OR if recent visit, treatment plan here.  BP Readings from Last 3 Encounters:  09/26/21 (!) 142/98  06/20/21 130/82  05/30/21 (!) 168/97    Lab Results  Component Value Date   HGBA1C 10.1 (H) 09/26/2021     Patient obtains medications through Adherence Packaging  30 Days   Last adherence delivery included:  Tresiba 25 units daily (Increased to 27 units) Pravastatin 40 mg 1 tablet daily (bedtime) Kerendia 10 mg daily at (breakfast) Women's 50 Plus Multivitamin 1 tablet daily (breakfast) Lyrica 50 mg 3 times daily 1 before breakfast, 1 with evening meal and 1 at (bedtime).  Amlodipine 5 mg 1 tablet daily (bedtime) Pantoprazole 40 mg 1  tablet daily (breakfast) Ergocalciferol 1250 mcg 1 capsule weekly on Wednesdays (breakfast) Linzess 145 mcg 1 tablet daily(before breakfast)  Patient declined (meds) last month: None  Patient is due for next adherence delivery on: 12-29-2021  Called patient and reviewed medications and coordinated delivery.  This delivery to include: Tresiba 27 units daily Lyrica 50 mg 3 times daily 1 before breakfast, 1 with evening meal and 1 at bedtime. Women's 50 Plus Multivitamin 1  daily (breakfast) Kerendia 10 mg daily (breakfast) Pravastatin 40 mg daily (bedtime) Amlodipine 5 mg daily (bedtime) Pantoprazole 40 mg daily (breakfast) Linzess 145 mcg daily (before breakfast) Vitamin D2 1250 mcg weekly on Wednesdays (breakfast) Insulin lispro 8 units 3 times daily, max daily dose 40 units  No acute/short fill needed  Patient declined the following medications: None  Patient needs refills for: Refills sent Amlodipine  Pantoprazole  Linzess Vitamin D2  Confirmed delivery date of 12-29-2021 advised patient that pharmacy will contact them the morning of delivery.   [place] Pharmacist Assistant [phone]

## 2021-12-19 NOTE — Progress Notes (Signed)
? ?Chronic Care Management ?Pharmacy Note ? ?12/29/2021 ?Name:  [name] MRN:  [id number] DOB:  1950/10/30 ? ?Summary: ?Patient reports that she is doing much better  ? ?Recommendations/Changes made from today's visit: ?Recommend patient continue using CGM  ?Patient to increase water by one bottle per day  ?Patient to have microalbuminuria lab completed during next office visit  ?Recommend patients Ozempic be increased to 1 mg once per week  ? ?Plan: ?Collaborate with PCP team to discuss patients cholesterol medication being changed to Atorvastatin due to ASCVD risk, patient to start at Atorvastatin 10 mg tablet once per day.  ?Collaborate with PCP team to increase patients Ozempic to 1 mg  ? ? ?Subjective: ?[name] is an 71 y.o. year old female who is a primary patient of [name], [place].  The CCM team was consulted for assistance with disease management and care coordination needs.   ? ?Engaged with patient by telephone for follow up visit in response to provider referral for pharmacy case management and/or care coordination services.  ? ?Consent to Services:  ?The patient was given information about Chronic Care Management services, agreed to services, and gave verbal consent prior to initiation of services.  Please see initial visit note for detailed documentation.  ? ?Patient Care Team: ?[name], FNP as PCP - General (General Practice) ?Elayne Snare, MD as Consulting Physician (Endocrinology) ?Bjorn Loser, MD as Consulting Physician (Urology) ?Juanita Craver, MD as Consulting Physician (Gastroenterology) ?Little, Claudette Stapler, RN as Case Manager ?Mayford Knife, RPH (Pharmacist) ? ? ?Hospital visits: ?None in previous 6 months ? ? ?Objective: ? ?Lab Results  ?Component Value Date  ? CREATININE 1.22 (H) 09/26/2021  ? BUN 17 09/26/2021  ? GFR 51.07 (L) 05/17/2017  ? EGFR 48 (L) 09/26/2021  ? GFRNONAA 59 (L) 12/01/2020  ? GFRAA 57 (L) 07/07/2020  ? NA 143 09/26/2021  ? K 4.3  09/26/2021  ? CALCIUM 9.8 09/26/2021  ? CO2 26 09/26/2021  ? GLUCOSE 79 09/26/2021  ? ? ?Lab Results  ?Component Value Date/Time  ? HGBA1C 10.1 (H) 09/26/2021 02:44 PM  ? HGBA1C 11.2 (H) 06/20/2021 10:49 AM  ? FRUCTOSAMINE 388 (H) 05/17/2017 08:54 AM  ? FRUCTOSAMINE 286 (H) 07/20/2014 10:07 AM  ? GFR 51.07 (L) 05/17/2017 08:54 AM  ? GFR 64.19 02/20/2017 11:52 AM  ? MICROALBUR 80 12/13/2020 05:01 PM  ? MICROALBUR 80 12/31/2019 03:19 PM  ?  ?Last diabetic Eye exam: No results found for: HMDIABEYEEXA  ?Last diabetic Foot exam: No results found for: HMDIABFOOTEX  ? ?Lab Results  ?Component Value Date  ? CHOL 190 09/26/2021  ? HDL 55 09/26/2021  ? LDLCALC 123 (H) 09/26/2021  ? LDLDIRECT 152.8 10/09/2013  ? TRIG 65 09/26/2021  ? CHOLHDL 3.5 09/26/2021  ? ? ? ?  Latest Ref Rng & Units 09/26/2021  ?  2:44 PM 04/13/2021  ?  3:04 PM 07/07/2020  ?  5:11 PM  ?Hepatic Function  ?Total Protein 6.0 - 8.5 g/dL 7.2   6.1   7.0    ?Albumin 3.8 - 4.8 g/dL 3.8   3.6   4.2    ?AST 0 - 40 IU/L 18   11   19     ?ALT 0 - 32 IU/L 16   6   17     ?Alk Phosphatase 44 - 121 IU/L 121   108   107    ?Total Bilirubin 0.0 - 1.2 mg/dL 0.3   0.6   0.3    ? ? ?  Lab Results  ?Component Value Date/Time  ? TSH 1.990 06/09/2018 12:42 PM  ? TSH 2.047 05/31/2010 11:11 PM  ? ? ? ?  Latest Ref Rng & Units 04/13/2021  ?  3:04 PM 12/13/2020  ?  3:32 PM 12/01/2020  ?  8:59 AM  ?CBC  ?WBC 3.4 - 10.8 x10E3/uL 5.9   5.7   4.3    ?Hemoglobin 11.1 - 15.9 g/dL 13.3   15.1   13.9    ?Hematocrit 34.0 - 46.6 % 41.3   46.5   42.7    ?Platelets 150 - 450 x10E3/uL 274   289   238    ? ? ?Lab Results  ?Component Value Date/Time  ? VD25OH 117.0 (H) 09/26/2021 02:44 PM  ? VD25OH 34.4 05/03/2020 02:55 PM  ? ? ?Clinical ASCVD: No  ?The 10-year ASCVD risk score (Arnett DK, et al., 2019) is: 67.4% ?  Values used to calculate the score: ?    Age: 7 years ?    Sex: Female ?    Is Non-Hispanic African American: Yes ?    Diabetic: Yes ?    Tobacco smoker: Yes ?    Systolic Blood Pressure:  181 mmHg ?    Is BP treated: Yes ?    HDL Cholesterol: 55 mg/dL ?    Total Cholesterol: 190 mg/dL   ? ? ?  01/11/2021  ? 11:45 AM 12/31/2019  ?  2:17 PM 10/22/2019  ?  3:33 PM  ?Depression screen PHQ 2/9  ?Decreased Interest 0 0 0  ?Down, Depressed, Hopeless 0 0 0  ?PHQ - 2 Score 0 0 0  ?Altered sleeping  0   ?Tired, decreased energy  0   ?Change in appetite  0   ?Feeling bad or failure about yourself   0   ?Trouble concentrating  0   ?Moving slowly or fidgety/restless  0   ?Suicidal thoughts  0   ?PHQ-9 Score  0   ?Difficult doing work/chores  Not difficult at all   ?  ? ? ?Social History  ? ?Tobacco Use  ?Smoking Status Never  ?Smokeless Tobacco Never  ? ?BP Readings from Last 3 Encounters:  ?09/26/21 (!) 142/98  ?06/20/21 130/82  ?05/30/21 (!) 168/97  ? ?Pulse Readings from Last 3 Encounters:  ?09/26/21 98  ?06/20/21 81  ?05/30/21 84  ? ?Wt Readings from Last 3 Encounters:  ?09/26/21 173 lb 9.6 oz (78.7 kg)  ?06/20/21 166 lb 6.4 oz (75.5 kg)  ?05/03/21 159 lb 3.2 oz (72.2 kg)  ? ?BMI Readings from Last 3 Encounters:  ?09/26/21 31.75 kg/m?  ?06/20/21 30.43 kg/m?  ?05/03/21 29.12 kg/m?  ? ? ?Assessment/Interventions: Review of patient past medical history, allergies, medications, health status, including review of consultants reports, laboratory and other test data, was performed as part of comprehensive evaluation and provision of chronic care management services.  ? ?SDOH:  (Social Determinants of Health) assessments and interventions performed: No ? ?SDOH Screenings  ? ?Alcohol Screen: Not on file  ?Depression (PHQ2-9): Low Risk   ? PHQ-2 Score: 0  ?Financial Resource Strain: Low Risk   ? Difficulty of Paying Living Expenses: Not hard at all  ?Food Insecurity: No Food Insecurity  ? Worried About Charity fundraiser in the Last Year: Never true  ? Ran Out of Food in the Last Year: Never true  ?Housing: Not on file  ?Physical Activity: Inactive  ? Days of Exercise per Week: 0 days  ? Minutes of Exercise per  Session: 0  min  ?Social Connections: Not on file  ?Stress: No Stress Concern Present  ? Feeling of Stress : Not at all  ?Tobacco Use: Low Risk   ? Smoking Tobacco Use: Never  ? Smokeless Tobacco Use: Never  ? Passive Exposure: Not on file  ?Transportation Needs: No Transportation Needs  ? Lack of Transportation (Medical): No  ? Lack of Transportation (Non-Medical): No  ? ? ?CCM Care Plan ? ?Allergies  ?Allergen Reactions  ? Pollen Extract Other (See Comments)  ?  Congestion/sneezing  ? Tomato Hives and Itching  ? Codeine Hives, Itching and Nausea And Vomiting  ? ? ?Medications Reviewed Today   ? ? Reviewed by Mayford Knife, RPH (Pharmacist) on 11/10/21 at 1026  Med List Status: <None>  ? ?Medication Order Taking? Sig Documenting Provider Last Dose Status Informant  ?amLODipine (NORVASC) 5 MG tablet [id number] No Take 1 tablet (5 mg total) by mouth at bedtime. [name], FNP Taking Active   ?aspirin EC 81 MG EC tablet [id number] No Take 1 tablet (81 mg total) by mouth daily. Swallow whole. Elmarie Shiley, MD Taking Active   ?blood glucose meter kit and supplies KIT [id number] No Dispense based on patient and insurance preference. Check blood sugars 4 times daily E11.69 [name], FNP Taking Active   ?Continuous Blood Gluc Receiver (FREESTYLE LIBRE READER) DEVI [id number] No 1 each by Does not apply route See admin instructions. CGM device-Freestyle Elenor Legato [provider] Taking Active Self  ?Continuous Blood Gluc Sensor (FREESTYLE LIBRE 14 DAY SENSOR) [place] [id number] No 1 each by Does not apply route See admin instructions. CGM 14-day sensors; send refills to [employer] (in [employer]) [provider] Taking Active Self  ?Finerenone (KERENDIA) 10 MG TABS [id number]  Take 1 tablet by mouth daily. [name], FNP  Active   ?HUMALOG 100 UNIT/ML injection [id number] No Inject 0.1 mLs (10 Units total) into the skin 3 (three) times daily with meals. [name], FNP Taking Active   ?insulin degludec  (TRESIBA FLEXTOUCH) 100 UNIT/ML FlexTouch Pen [id number] No Inject 27 Units into the skin daily. [name], FNP Taking Active   ?Insulin Pen Needle (BD PEN NEEDLE MICRO U/F) 32G X 6 MM MISC [id number] No DX:E11

## 2021-12-24 DIAGNOSIS — N1831 Chronic kidney disease, stage 3a: Secondary | ICD-10-CM

## 2021-12-24 DIAGNOSIS — E1122 Type 2 diabetes mellitus with diabetic chronic kidney disease: Secondary | ICD-10-CM

## 2021-12-24 DIAGNOSIS — I10 Essential (primary) hypertension: Secondary | ICD-10-CM

## 2021-12-24 DIAGNOSIS — Z794 Long term (current) use of insulin: Secondary | ICD-10-CM | POA: Diagnosis not present

## 2021-12-25 ENCOUNTER — Other Ambulatory Visit: Payer: Self-pay | Admitting: Nurse Practitioner

## 2021-12-26 DIAGNOSIS — N3020 Other chronic cystitis without hematuria: Secondary | ICD-10-CM | POA: Diagnosis not present

## 2021-12-29 MED ORDER — OZEMPIC (1 MG/DOSE) 4 MG/3ML ~~LOC~~ SOPN: 1.0000 mg | PEN_INJECTOR | SUBCUTANEOUS | 2 refills | Status: AC

## 2021-12-29 MED ORDER — ATORVASTATIN CALCIUM 10 MG PO TABS: 10.0000 mg | ORAL_TABLET | Freq: Every day | ORAL | 3 refills | Status: DC

## 2021-12-29 NOTE — Patient Instructions (Signed)
Visit Information ?It was great speaking with you today!  Please let me know if you have any questions about our visit. ? ? Goals Addressed   ? ?  ?  ?  ?  ? This Visit's Progress  ?  Manage My Medicine     ?  Timeframe:  Long-Range Goal ?Priority:  High ?Start Date:                             ?Expected End Date:                      ? ?Follow Up Date 01/19/2022 ? ?In Progress:  ?  ?- call for medicine refill 2 or 3 days before it runs out ?- call if I am sick and can't take my medicine ?- keep a list of all the medicines I take; vitamins and herbals too ?- learn to read medicine labels ?- use a pillbox to sort medicine ?- use an alarm clock or phone to remind me to take my medicine  ?- using CGM to check BS at least 4 times per day  ?- take the blood sugar meter to all doctor visits  ?  ?  ?Why is this important?   ?These steps will help you keep on track with your medicines. ?Please call if you have any questions about your medications.  ?Checking your blood sugar at home helps to keep it from getting very high or  very low.  ?Keep up the great work with checking you BS using your CGM device.  ? ?  ? ?  ? ?  ? ?Patient Care Plan: [place]  ?  ? ?Problem Identified: HTN, DM II   ?Priority: High  ?  ? ?Long-Range Goal: Disease Management   ?Recent Progress: On track  ?Note:   ? ?Current Barriers:  ?Unable to achieve control of diabetes   ? ?Pharmacist Clinical Goal(s):  ?Patient will achieve adherence to monitoring guidelines and medication adherence to achieve therapeutic efficacy through collaboration with PharmD and provider.  ? ?Interventions: ?1:1 collaboration with [name], FNP regarding development and update of comprehensive plan of care as evidenced by provider attestation and co-signature ?Inter-disciplinary care team collaboration (see longitudinal plan of care) ?Comprehensive medication review performed; medication list updated in electronic medical record ? ?Hypertension (BP goal  <130/80) ?-Uncontrolled ?-Current treatment: ?Amlodipine 5 mg tablet at bedtime Appropriate, Query effective ?-Medications previously tried: spironolactone, hydrochlorothiazide  ?-Current home readings: 120/78  ?-Current dietary habits: Ms. Scobee is not using any salt  ?-Current exercise habits:please see diabetes for more information  ?-Denies hypotensive/hypertensive symptoms ?-Educated on BP goals and benefits of medications for prevention of heart attack, stroke and kidney damage; ?Exercise goal of 150 minutes per week; ?Symptoms of hypotension and importance of maintaining adequate hydration;  ?-Congratulated checking her BP twice per day  ?-Counseled to monitor BP at home twice per day, document, and provide log at future appointments ?-Collaborated with PCP to start patient on Valsartan  ? ? ?Diabetes (A1c goal <7%) ?-Uncontrolled ?-Current medications: ?Humalog 0.1  - see the following  Appropriate, Effective, Safe, Accessible ?150-199 - 2 units ?200-249 - 4 units ?250-299 - 6 units ?300-349 - 8 units  ?Greater then 350 is 10 units  ?Patient reports that she has not had to use the sliding scale as often  ?If this happens to call in with blood sugar reading  ?  Tresiba 100 unit/ml - inject 27 units into the skin daily Appropriate, Effective, Safe, Accessible ?Ozempic 0.5 mg - once per week Appropriate, Query effective, ?Recommend increasing to 1 mg  ?-Medications previously tried: [place]  ?-Current home glucose readings : using CGM for BS readings ?  ? Target Date(s) 14 Days  30 Days ? ?  ?Number of days worn ? 14 days     ?% of time active ? 70% 63% 69% 75%  ?Average Glucose (mg/dL)  176 176 179   ?GMI (2-week A1c estimate) ?= 3.31 + 0.02392 x [mean glucose in mg/dL]      ?Glycemic Variability (%CV) ?36%     ?Time above >240 mg/dL <5% 12% 13% 12%  ?Time above 181-240 mg/dL <25% 30% 33% 33%  ?Time in range: 70-180 mg/dL >70% 57% 53% 55%  ?Time below 70 mg/dL <4% 1% 1% 0%  ?Time below 54 mg/dL <1%     ?Low  Events   '1 1 2  '$ ? ?-Denies hypoglycemic/hyperglycemic symptoms ?-Current meal patterns:  ?drinks: goal of drinking more water  ?-Current exercise: She is still exercising at home and she does exercise at home three times per week. [facility]  ?-Educated on Proper insulin injection technique; ?Prevention and management of hypoglycemic episodes; ?Benefits of routine self-monitoring of blood sugar; ?-Counseled to check feet daily and get yearly eye exams ?-Collaborated with PCP team to increase patients Ozempic to 1 mg once per week.  ? ?Patient Goals/Self-Care Activities ?Patient will:  ?- take medications as prescribed as evidenced by patient report and record review ? ?Follow Up Plan: The patient has been provided with contact information for the care management team and has been advised to call with any health related questions or concerns.  ?  ? ? ? ? ?Patient agreed to services and verbal consent obtained.  ? ?The patient verbalized understanding of instructions, educational materials, and care plan provided today and agreed to receive a mailed copy of patient instructions, educational materials, and care plan.  ? ?[name], PharmD ?Clinical Pharmacist ?[facility] ?[phone] ?  ?

## 2022-01-03 DIAGNOSIS — E118 Type 2 diabetes mellitus with unspecified complications: Secondary | ICD-10-CM | POA: Diagnosis not present

## 2022-01-03 DIAGNOSIS — Z794 Long term (current) use of insulin: Secondary | ICD-10-CM | POA: Diagnosis not present

## 2022-01-09 DIAGNOSIS — N3020 Other chronic cystitis without hematuria: Secondary | ICD-10-CM | POA: Diagnosis not present

## 2022-01-16 ENCOUNTER — Telehealth: Payer: Self-pay

## 2022-01-16 NOTE — Chronic Care Management (AMB) (Addendum)
    [name] was reminded to have all medications, supplements and any blood glucose and blood pressure readings available for review with [name], Pharm. D, at her telephone visit on 01-19-2022 at 10:00.  01-19-2022: Patient aware 01-19-2022 appointment was rescheduled to 01-23-2022 at 9:00  [place] Pharmacist Assistant [phone]

## 2022-01-17 ENCOUNTER — Telehealth: Payer: Self-pay

## 2022-01-17 NOTE — Chronic Care Management (AMB) (Addendum)
Chronic Care Management Pharmacy Assistant   Name: [name]  MRN: [id number] DOB: 1951-08-12   Reason for Encounter: Medication Review/Medication coordination   Recent office visits:  None  Recent consult visits:  None  Hospital visits:  None in previous 6 months  Medications: Outpatient Encounter Medications as of 01/17/2022  Medication Sig   amLODipine (NORVASC) 5 MG tablet Take 1 tablet (5 mg total) by mouth at bedtime.   aspirin EC 81 MG EC tablet Take 1 tablet (81 mg total) by mouth daily. Swallow whole.   atorvastatin (LIPITOR) 10 MG tablet Take 1 tablet (10 mg total) by mouth daily.   blood glucose meter kit and supplies KIT Dispense based on patient and insurance preference. Check blood sugars 4 times daily E11.69   Continuous Blood Gluc Receiver (FREESTYLE LIBRE READER) DEVI 1 each by Does not apply route See admin instructions. CGM device-Freestyle Libre   Continuous Blood Gluc Sensor (FREESTYLE LIBRE 14 DAY SENSOR) MISC 1 each by Does not apply route See admin instructions. CGM 14-day sensors; send refills to [employer] (in [facility])   Finerenone (KERENDIA) 10 MG TABS Take 1 tablet by mouth daily.   HUMALOG 100 UNIT/ML injection Inject 0.1 mLs (10 Units total) into the skin 3 (three) times daily with meals.   insulin degludec (TRESIBA FLEXTOUCH) 100 UNIT/ML FlexTouch Pen Inject 27 Units into the skin daily.   Insulin Pen Needle (BD PEN NEEDLE MICRO U/F) 32G X 6 MM MISC DX:E11.65 USE TO CHECK BLOOD SUGARS THREE TIMES A DAY   Lancets Misc. MISC 1 each by Does not apply route 4 (four) times daily -  with meals and at bedtime.   linaclotide (LINZESS) 145 MCG CAPS capsule Take 1 capsule (145 mcg total) by mouth daily before breakfast.   Multiple Vitamin (MULTIVITAMIN WITH MINERALS) TABS tablet Take 1 tablet by mouth daily.   Multiple Vitamin (MULTIVITAMIN) tablet Take 1 tablet by mouth daily.   nystatin cream (MYCOSTATIN) Apply to affected area 2 times daily    ONETOUCH VERIO test strip USE TO test blood sugar FOUR TIMES DAILY   pantoprazole (PROTONIX) 40 MG tablet TAKE ONE TABLET BY MOUTH ONCE DAILY   pregabalin (LYRICA) 50 MG capsule TAKE ONE CAPSULE BY MOUTH BEFORE BREAKFAST and TAKE ONE CAPSULE BY MOUTH EVERY EVENING and TAKE ONE CAPSULE BY MOUTH EVERYDAY AT BEDTIME   Semaglutide, 1 MG/DOSE, (OZEMPIC, 1 MG/DOSE,) 4 MG/3ML SOPN Inject 1 mg into the skin once a week.   Vibegron (GEMTESA) 75 MG TABS Take 75 mg by mouth daily.   Vitamin D, Ergocalciferol, (DRISDOL) 1.25 MG (50000 UNIT) CAPS capsule TAKE ONE CAPSULE BY MOUTH ONCE WEEKLY ON WEDNESDAY   No facility-administered encounter medications on file as of 01/17/2022.   Reviewed chart for medication changes ahead of medication coordination call.  No OVs, Consults, or hospital visits since last care coordination call/Pharmacist visit.  No medication changes indicated OR if recent visit, treatment plan here.  BP Readings from Last 3 Encounters:  09/26/21 (!) 142/98  06/20/21 130/82  05/30/21 (!) 168/97    Lab Results  Component Value Date   HGBA1C 10.1 (H) 09/26/2021     Patient obtains medications through Adherence Packaging  30 Days   Last adherence delivery included:  Tresiba 27 units daily Lyrica 50 mg 3 times daily 1 before breakfast, 1 with evening meal and 1 at bedtime. Women's 50 Plus Multivitamin 1 daily (breakfast) Kerendia 10 mg daily (breakfast) Pravastatin 40 mg daily (bedtime) Amlodipine  5 mg daily (bedtime) Pantoprazole 40 mg daily (breakfast) Linzess 145 mcg daily (before breakfast) Vitamin D2 1250 mcg weekly on Wednesdays (breakfast)  Patient declined (meds) last month:  None  Patient is due for next adherence delivery on: 01-30-2022  Called patient and reviewed medications and coordinated delivery.  This delivery to include: Tresiba 27 units daily Lyrica 50 mg 3 times daily 1 before breakfast, 1 with evening meal and 1 at bedtime. Women's 50 Plus  Multivitamin 1 daily (breakfast) Kerendia 10 mg daily (breakfast)- in easy open vials Pravastatin 40 mg daily (bedtime) Amlodipine 5 mg daily (bedtime) Pantoprazole 40 mg daily (breakfast) Linzess 145 mcg daily (before breakfast)- in easy open vial Vitamin D2 1250 mcg weekly on Wednesdays (breakfast) Insulin lispro 8 units 3 times daily, max daily dose 40 units  No acute/short fill needed  Patient declined the following medications: Ozempic- Patient wants to continue getting medication from [facility].  Patient needs refills for: [place] Insulin lispro  Confirmed delivery date of 01-30-2022 advised patient that pharmacy will contact them the morning of delivery.   [place] Pharmacist Assistant [phone]

## 2022-01-19 ENCOUNTER — Telehealth: Payer: Medicare Other

## 2022-01-19 ENCOUNTER — Other Ambulatory Visit: Payer: Self-pay

## 2022-01-19 DIAGNOSIS — N1831 Chronic kidney disease, stage 3a: Secondary | ICD-10-CM

## 2022-01-19 MED ORDER — KERENDIA 10 MG PO TABS: 1.0000 | ORAL_TABLET | Freq: Every day | ORAL | 2 refills | Status: AC

## 2022-01-23 ENCOUNTER — Ambulatory Visit (INDEPENDENT_AMBULATORY_CARE_PROVIDER_SITE_OTHER): Payer: Medicare Other

## 2022-01-23 DIAGNOSIS — E782 Mixed hyperlipidemia: Secondary | ICD-10-CM

## 2022-01-23 DIAGNOSIS — N1831 Chronic kidney disease, stage 3a: Secondary | ICD-10-CM

## 2022-01-23 NOTE — Progress Notes (Signed)
Chronic Care Management Pharmacy Note  01/23/2022 Name:  [name] MRN:  [id number] DOB:  29-Sep-1950  Summary: Patient reports that her blood sugars are going in the right direction and she is working on learning how to review her BS readings.   Recommendations/Changes made from today's visit: Recommend she receive COVID-19 booster Recommend patient have annual microalbuminuria, a1c, and vitamin D test completed during next office visit.  Recommended possibility of adding an ARB to patients current medication regimen  Plan: Patient reports she will get COVID-19 booster from the pharmacy  Collaborate with the PCP team for patient to receive Vitamin D labs, microalbinuria lab, vitamin D labs, also would suggest potassium levels are reviewed.  Collaborate with  PCP to see if patient should be started on ARB due to BP and CKD.    Subjective: ELVERTA [name] is an 71 y.o. year old female who is a primary patient of [name], [place].  The CCM team was consulted for assistance with disease management and care coordination needs.    Engaged with patient by telephone for follow up visit in response to provider referral for pharmacy case management and/or care coordination services.   Consent to Services:  The patient was given information about Chronic Care Management services, agreed to services, and gave verbal consent prior to initiation of services.  Please see initial visit note for detailed documentation.   Patient Care Team: Gwendolyn Brine, FNP as PCP - General (General Practice) Elayne Snare, MD as Consulting Physician (Endocrinology) Bjorn Loser, MD as Consulting Physician (Urology) Juanita Craver, MD as Consulting Physician (Gastroenterology) Rex Kras Claudette Stapler, RN as Case Manager Mayford Knife, [facility] (Pharmacist)  Recent office visits: 09/26/2021 PCP [facility]  Recent consult visits: 10/30/2021 Endocrinology [facility] visits: None in previous 6  months   Objective:  Lab Results  Component Value Date   CREATININE 1.22 (H) 09/26/2021   BUN 17 09/26/2021   GFR 51.07 (L) 05/17/2017   EGFR 48 (L) 09/26/2021   GFRNONAA 59 (L) 12/01/2020   GFRAA 57 (L) 07/07/2020   NA 143 09/26/2021   K 4.3 09/26/2021   CALCIUM 9.8 09/26/2021   CO2 26 09/26/2021   GLUCOSE 79 09/26/2021    Lab Results  Component Value Date/Time   HGBA1C 10.1 (H) 09/26/2021 02:44 PM   HGBA1C 11.2 (H) 06/20/2021 10:49 AM   FRUCTOSAMINE 388 (H) 05/17/2017 08:54 AM   FRUCTOSAMINE 286 (H) 07/20/2014 10:07 AM   GFR 51.07 (L) 05/17/2017 08:54 AM   GFR 64.19 02/20/2017 11:52 AM   MICROALBUR 80 12/13/2020 05:01 PM   MICROALBUR 80 12/31/2019 03:19 PM    Last diabetic Eye exam: No results found for: HMDIABEYEEXA  Last diabetic Foot exam: No results found for: HMDIABFOOTEX   Lab Results  Component Value Date   CHOL 190 09/26/2021   HDL 55 09/26/2021   LDLCALC 123 (H) 09/26/2021   LDLDIRECT 152.8 10/09/2013   TRIG 65 09/26/2021   CHOLHDL 3.5 09/26/2021       Latest Ref Rng & Units 09/26/2021    2:44 PM 04/13/2021    3:04 PM 07/07/2020    5:11 PM  Hepatic Function  Total Protein 6.0 - 8.5 g/dL 7.2   6.1   7.0    Albumin 3.8 - 4.8 g/dL 3.8   3.6   4.2    AST 0 - 40 IU/L 18   11   19     ALT 0 - 32 IU/L 16   6  17    Alk Phosphatase 44 - 121 IU/L 121   108   107    Total Bilirubin 0.0 - 1.2 mg/dL 0.3   0.6   0.3      Lab Results  Component Value Date/Time   TSH 1.990 06/09/2018 12:42 PM   TSH 2.047 05/31/2010 11:11 PM       Latest Ref Rng & Units 04/13/2021    3:04 PM 12/13/2020    3:32 PM 12/01/2020    8:59 AM  CBC  WBC 3.4 - 10.8 x10E3/uL 5.9   5.7   4.3    Hemoglobin 11.1 - 15.9 g/dL 13.3   15.1   13.9    Hematocrit 34.0 - 46.6 % 41.3   46.5   42.7    Platelets 150 - 450 x10E3/uL 274   289   238      Lab Results  Component Value Date/Time   VD25OH 117.0 (H) 09/26/2021 02:44 PM   VD25OH 34.4 05/03/2020 02:55 PM    Clinical ASCVD: No   The 10-year ASCVD risk score (Arnett DK, et al., 2019) is: 67.4%   Values used to calculate the score:     Age: 15 years     Sex: Female     Is Non-Hispanic African American: Yes     Diabetic: Yes     Tobacco smoker: Yes     Systolic Blood Pressure: 655 mmHg     Is BP treated: Yes     HDL Cholesterol: 55 mg/dL     Total Cholesterol: 190 mg/dL       01/11/2021   11:45 AM 12/31/2019    2:17 PM 10/22/2019    3:33 PM  Depression screen PHQ 2/9  Decreased Interest 0 0 0  Down, Depressed, Hopeless 0 0 0  PHQ - 2 Score 0 0 0  Altered sleeping  0   Tired, decreased energy  0   Change in appetite  0   Feeling bad or failure about yourself   0   Trouble concentrating  0   Moving slowly or fidgety/restless  0   Suicidal thoughts  0   PHQ-9 Score  0   Difficult doing work/chores  Not difficult at all       Social History   Tobacco Use  Smoking Status Never  Smokeless Tobacco Never   BP Readings from Last 3 Encounters:  09/26/21 (!) 142/98  06/20/21 130/82  05/30/21 (!) 168/97   Pulse Readings from Last 3 Encounters:  09/26/21 98  06/20/21 81  05/30/21 84   Wt Readings from Last 3 Encounters:  09/26/21 173 lb 9.6 oz (78.7 kg)  06/20/21 166 lb 6.4 oz (75.5 kg)  05/03/21 159 lb 3.2 oz (72.2 kg)   BMI Readings from Last 3 Encounters:  09/26/21 31.75 kg/m  06/20/21 30.43 kg/m  05/03/21 29.12 kg/m    Assessment/Interventions: Review of patient past medical history, allergies, medications, health status, including review of consultants reports, laboratory and other test data, was performed as part of comprehensive evaluation and provision of chronic care management services.   SDOH:  (Social Determinants of Health) assessments and interventions performed: No  SDOH Screenings   Alcohol Screen: Not on file  Depression (VZS8-2): Not on file  Financial Resource Strain: Not on file  Food Insecurity: Not on file  Housing: Not on file  Physical Activity: Not on file   Social Connections: Not on file  Stress: Not on file  Tobacco Use: Low Risk  Smoking Tobacco Use: Never   Smokeless Tobacco Use: Never   Passive Exposure: Not on file  Transportation Needs: Not on file    CCM Care Plan  Allergies  Allergen Reactions   Pollen Extract Other (See Comments)    Congestion/sneezing   Tomato Hives and Itching   Codeine Hives, Itching and Nausea And Vomiting    Medications Reviewed Today     Reviewed by Mayford Knife, RPH (Pharmacist) on 11/10/21 at [place] List Status: <None>   Medication Order Taking? Sig Documenting Provider Last Dose Status Informant  amLODipine (NORVASC) 5 MG tablet [id number] No Take 1 tablet (5 mg total) by mouth at bedtime. Gwendolyn Brine, FNP Taking Active   aspirin EC 81 MG EC tablet [id number] No Take 1 tablet (81 mg total) by mouth daily. Swallow whole. Elmarie Shiley, MD Taking Active   blood glucose meter kit and supplies KIT [id number] No Dispense based on patient and insurance preference. Check blood sugars 4 times daily E11.69 Gwendolyn Brine, FNP Taking Active   Continuous Blood Gluc Receiver (FREESTYLE LIBRE READER) DEVI [id number] No 1 each by Does not apply route See admin instructions. CGM device-Freestyle Elenor Legato [provider] Taking Active Self  Continuous Blood Gluc Sensor (FREESTYLE LIBRE 14 DAY SENSOR) [place] [id number] No 1 each by Does not apply route See admin instructions. CGM 14-day sensors; send refills to [employer] (in [place]) [provider] Taking Active Self  Finerenone (KERENDIA) 10 MG TABS [id number]  Take 1 tablet by mouth daily. Gwendolyn Brine, FNP  Active   HUMALOG 100 UNIT/ML injection [id number] No Inject 0.1 mLs (10 Units total) into the skin 3 (three) times daily with meals. Gwendolyn Brine, FNP Taking Active   insulin degludec (TRESIBA FLEXTOUCH) 100 UNIT/ML FlexTouch Pen [id number] No Inject 27 Units into the skin daily. Gwendolyn Brine, FNP Taking Active   Insulin Pen Needle  (BD PEN NEEDLE MICRO U/F) 32G X 6 MM MISC [id number] No DX:E11.65 USE TO CHECK BLOOD SUGARS THREE TIMES A Lynnell Dike, MD Taking Active Self  Lancets Misc. [place] [id number] No 1 each by Does not apply route 4 (four) times daily -  with meals and at bedtime. Gwendolyn Brine, FNP Taking Active Self  linaclotide Rolan Lipa) 145 MCG CAPS capsule [id number] No Take 1 capsule (145 mcg total) by mouth daily before breakfast. Gwendolyn Brine, FNP Taking Active   Multiple Vitamin (MULTIVITAMIN WITH MINERALS) TABS tablet [id number] No Take 1 tablet by mouth daily. [provider] Taking Active Self  Multiple Vitamin (MULTIVITAMIN) tablet [id number] No Take 1 tablet by mouth daily. [provider] Taking Active   nystatin cream (MYCOSTATIN) [id number] No Apply to affected area 2 times daily Volney American, PA-C Taking Active   [facility] VERIO test strip [id number] No USE TO test blood sugar FOUR TIMES DAILY Gwendolyn Brine, FNP Taking Active   pantoprazole (PROTONIX) 40 MG tablet [id number] No Take 1 tablet (40 mg total) by mouth daily. Gwendolyn Brine, FNP Taking Active   pravastatin (PRAVACHOL) 40 MG tablet [id number] No TAKE ONE TABLET BY MOUTH EVERYDAY AT BEDTIME Gwendolyn Brine, FNP Taking Active   pregabalin (LYRICA) 50 MG capsule [id number]  TAKE ONE CAPSULE BY MOUTH BEFORE BREAKFAST and TAKE ONE CAPSULE BY MOUTH EVERY EVENING and TAKE ONE CAPSULE BY MOUTH EVERYDAY AT BEDTIME Gwendolyn Brine, FNP  Active   Semaglutide,0.25 or 0.5MG/DOS, (OZEMPIC, 0.25 OR 0.5 MG/DOSE,) 2 MG/1.5ML SOPN [id number] No Inject 0.5 mg into the skin once a week. Gwendolyn Brine, FNP  Taking Active   Vibegron (GEMTESA) 75 MG TABS [id number] No Take 75 mg by mouth daily. [provider] Taking Active Self            Patient Active Problem List   Diagnosis Date Noted   Abdominal bloating 07/03/2021   Chronic idiopathic constipation 07/03/2021   Colon cancer screening 07/03/2021   Diverticulosis of large intestine  without perforation or abscess without bleeding 07/03/2021   Dysphagia 07/03/2021   Flatulence, eructation and gas pain 07/03/2021   Hiatal hernia 07/03/2021   Quadriceps weakness 02/06/2021   Syncope 11/29/2020   Mild aortic stenosis 11/29/2020   Irritable bowel syndrome with diarrhea 10/10/2020   Gastroesophageal reflux disease with esophagitis 10/10/2020   Postlaminectomy syndrome, not elsewhere classified 07/03/2020   S/P lumbar laminectomy 03/30/2020   Localized swelling, mass and lump, multiple sites 03/27/2018   Type II diabetes mellitus with renal manifestations ([place]) 12/27/2017   HLD (hyperlipidemia) 12/27/2017   HTN (hypertension) 12/27/2017   CKD (chronic kidney disease), stage III (HCC) 12/27/2017   Cough variant asthma 12/01/2015   Osteoarthritis of left knee, primary localized 08/17/2014   Knee osteoarthritis 08/17/2014   Hypoglycemia 11/15/2013   Other and unspecified hyperlipidemia 08/06/2013   Type II diabetes mellitus, uncontrolled 07/20/2013   Varicose veins of lower extremities with other complications 29/79/8921   Fibromyalgia    GERD 05/18/2010   ABDOMINAL PAIN, GENERALIZED 05/02/2010   OBESITY 04/20/2010   BURSITIS, LEFT SHOULDER 03/09/2010   Lipoma of arm s/p excision 01/29/2014 01/10/2010   DEPRESSION 12/27/2009   BACK PAIN WITH RADICULOPATHY 12/27/2009   CYSTITIS, ACUTE 10/21/2009   MICROSCOPIC HEMATURIA 10/18/2009   KNEE PAIN, BILATERAL 10/18/2009   NEUROPATHY 09/08/2009   Asthma 09/08/2009   STRESS INCONTINENCE 09/08/2009   CHEST PAIN 07/19/2009   ESOPHAGEAL STRICTURE 08/27/2005    Immunization History  Administered Date(s) Administered   Fluad Quad(high Dose 65+) 05/03/2020, 05/03/2021   Influenza Whole 06/27/2009, 06/13/2010   Influenza, High Dose Seasonal PF 07/22/2019   Influenza,inj,Quad PF,6+ Mos 06/03/2017   Influenza-Unspecified 03/27/2013, 03/27/2014, 05/18/2015   PFIZER(Purple Top)SARS-COV-2 Vaccination 09/21/2019, 10/02/2019,  05/24/2020   Pneumococcal Conjugate-13 07/24/2017   Pneumococcal Polysaccharide-23 08/27/2006, 11/29/2009, 03/24/2019   Td 10/18/2009   Tdap 12/27/2017    Conditions to be addressed/monitored:  Hyperlipidemia and Diabetes  Care Plan : [place]  Updates made by Mayford Knife, [place] since 01/23/2022 12:00 AM     Problem: HLD, DM II   Priority: High     Long-Range Goal: Disease Management   This Visit's Progress: On track  Recent Progress: On track  Note:   Current Barriers:  Unable to independently monitor therapeutic efficacy  Pharmacist Clinical Goal(s):  Patient will achieve adherence to monitoring guidelines and medication adherence to achieve therapeutic efficacy through collaboration with PharmD and provider.   Interventions: 1:1 collaboration with Gwendolyn Brine, FNP regarding development and update of comprehensive plan of care as evidenced by provider attestation and co-signature Inter-disciplinary care team collaboration (see longitudinal plan of care) Comprehensive medication review performed; medication list updated in electronic medical record   Hyperlipidemia: (LDL goal < 55) -Controlled -Current treatment: Atorvastatin 10 mg tablet once per day. Appropriate, Effective Patient reports that she is doing well with the medication, she is not getting nauseated and not having any issues.  -Medications previously tried: pravastatin   -Current dietary patterns: she is not eating fried or fatty foods -Educated on Cholesterol goals;  Benefits of statin for ASCVD risk reduction; Importance of limiting  foods high in cholesterol; -Recommended to continue current medication, she recently started this medication less than one month ago.   Diabetes (A1c goal <7%) -Not ideally controlled -Current medications: Ozempic 1 mg once per week Appropriate, Effective, Safe, Accessible Tresiba 100 units/ml - 27 units per day Appropriate, Effective, Safe,  Accessible Humalog - sliding scale Appropriate, Effective, Safe, Accessible  -Current home glucose readings -Rarely having readings above 200   Target Date(s) May 1st - 30 th  Time above >240 mg/dL <5% 33 %  Time in 181-240  41 %  Time in range: 70-180 mg/dL >70% 26 %  Time below 70 mg/dL <4% 0%  Time below 54 mg/dL <1% 0 %      -98 scans, 3 scans per day, 65% of sensor data captured  -Denies hypoglycemic/hyperglycemic symptoms -Current meal patterns: her appetite has changed. She is not eating a much. This morning she had a light breakfast. She had a 4 ounce glass of orange juice. She is not eating a lot of carbs with her meals, the most she is having about 5 carbs with her meal. Sometimes she will have all vegetables.  We will discuss further what a serving of carbs means for her.  -She is noticing she is not having any low readings and that the amount of readings of >240 is going down.  -Current exercise: will discuss further during next office visit.  -Educated on Complications of diabetes including kidney damage, retinal damage, and cardiovascular disease; Continuous glucose monitoring; -Counseled to check feet daily and get yearly eye exams -Counseled on how to use her CGM for data  Collaborate with PCP team to have a1c checked in office tomorrow, microalbuminuria test to be completed. Team to review patient notes for eye exam, from Dr. Trish Fountain office, was refaxed last week on Thursday.   Vitamin D Deficiency   -Not ideally controlled -Current treatment  Vitamin D 50, 000 units - taking 1 capsule quickly Appropriate, Effective, Query Safe -Medications previously tried:  -Recommended patient discontinue Vitamin D for now, patient to have lab completed on 5/31  -Collaborated with PCP to have lab drawn, medication to be discontinued for now.    Patient Goals/Self-Care Activities Patient will:  - check glucose at least 4 times per day , document, and provide at future appointments    Follow Up Plan: The patient has been provided with contact information for the care management team and has been advised to call with any health related questions or concerns.       Medication Assistance: None required.  Patient affirms current coverage meets needs.  Compliance/Adherence/Medication fill history: Care Gaps: Ophthalmology Exam COVID-19 Vaccine  Urine Microalbuminuria test  Star-Rating Drugs: Atorvastatin 10 mg tablet Ozempic 1 mg injection   Patient's preferred pharmacy is:  [facility] - [place], [place] - [address] [place] [address] Phone: [phone] Fax: [phone]  [facility] [phone] - [place], [place] - [address] [place] [facility] [address] Phone: [phone] Fax: [place], [place] - [address] [place] [address] Phone: [phone] Fax: [phone]  Uses pill box? No - patient reports vials work well for her and she receives all of her medication at the same time each month  Pt endorses 85% compliance  We discussed: Benefits of medication synchronization, packaging and delivery as well as enhanced Public house manager  with [employer]. Patient decided to: Continue current medication management strategy  Care Plan and Follow Up Patient Decision:  Patient agrees to Care Plan and Follow-up.  Plan: The patient has been provided with contact information for the care management team and has been advised to call with any health related questions or concerns.   [name], CPP, PharmD Clinical Pharmacist Practitioner [facility] [phone]

## 2022-01-23 NOTE — Patient Instructions (Signed)
Visit Information It was great speaking with you today!  Please let me know if you have any questions about our visit.   Goals Addressed             This Visit's Progress    Manage My Medicine       Timeframe:  Long-Range Goal Priority:  High Start Date:                             Expected End Date:                       Follow Up Date 02/28/2022  In Progress:    - call for medicine refill 2 or 3 days before it runs out - call if I am sick and can't take my medicine - keep a list of all the medicines I take; vitamins and herbals too - learn to read medicine labels - use a pillbox to sort medicine - use an alarm clock or phone to remind me to take my medicine  - using CGM to check BS at least 4 times per day  - take the blood sugar meter to all doctor visits      Why is this important?   These steps will help you keep on track with your medicines. Please call if you have any questions about your medications.  Checking your blood sugar at home helps to keep it from getting very high or  very low.  Keep up the great work with checking you BS using your CGM device.            Patient Care Plan: CCM Pharmacy Care Plan     Problem Identified: HLD, DM II   Priority: High     Long-Range Goal: Disease Management   This Visit's Progress: On track  Recent Progress: On track  Note:   Current Barriers:  Unable to independently monitor therapeutic efficacy  Pharmacist Clinical Goal(s):  Patient will achieve adherence to monitoring guidelines and medication adherence to achieve therapeutic efficacy through collaboration with PharmD and provider.   Interventions: 1:1 collaboration with [name], FNP regarding development and update of comprehensive plan of care as evidenced by provider attestation and co-signature Inter-disciplinary care team collaboration (see longitudinal plan of care) Comprehensive medication review performed; medication list updated in  electronic medical record   Hyperlipidemia: (LDL goal < 55) -Controlled -Current treatment: Atorvastatin 10 mg tablet once per day. Appropriate, Effective Patient reports that she is doing well with the medication, she is not getting nauseated and not having any issues.  -Medications previously tried: pravastatin   -Current dietary patterns: she is not eating fried or fatty foods -Educated on Cholesterol goals;  Benefits of statin for ASCVD risk reduction; Importance of limiting foods high in cholesterol; -Recommended to continue current medication, she recently started this medication less than one month ago.   Diabetes (A1c goal <7%) -Not ideally controlled -Current medications: Ozempic 1 mg once per week Appropriate, Effective, Safe, Accessible Tresiba 100 units/ml - 27 units per day Appropriate, Effective, Safe, Accessible Humalog - sliding scale Appropriate, Effective, Safe, Accessible  -Current home glucose readings -Rarely having readings above 200   Target Date(s) May 1st - 30 th  Time above >240 mg/dL <5% 33 %  Time in 181-240  41 %  Time in range: 70-180 mg/dL >70% 26 %  Time below 70 mg/dL <4% 0%  Time below 54  mg/dL <1% 0 %      -98 scans, 3 scans per day, 65% of sensor data captured  -Denies hypoglycemic/hyperglycemic symptoms -Current meal patterns: her appetite has changed. She is not eating a much. This morning she had a light breakfast. She had a 4 ounce glass of orange juice. She is not eating a lot of carbs with her meals, the most she is having about 5 carbs with her meal. Sometimes she will have all vegetables.  We will discuss further what a serving of carbs means for her.  -She is noticing she is not having any low readings and that the amount of readings of >240 is going down.  -Current exercise: will discuss further during next office visit.  -Educated on Complications of diabetes including kidney damage, retinal damage, and cardiovascular  disease; Continuous glucose monitoring; -Counseled to check feet daily and get yearly eye exams -Counseled on how to use her CGM for data  Collaborate with PCP team to have a1c checked in office tomorrow, microalbuminuria test to be completed. Team to review patient notes for eye exam, from Dr. Trish Fountain office, was refaxed last week on Thursday.   Vitamin D Deficiency   -Not ideally controlled -Current treatment  Vitamin D 50, 000 units - taking 1 capsule quickly Appropriate, Effective, Query Safe -Medications previously tried:  -Recommended patient discontinue Vitamin D for now, patient to have lab completed on 5/31  -Collaborated with PCP to have lab drawn, medication to be discontinued for now.    Patient Goals/Self-Care Activities Patient will:  - check glucose at least 4 times per day , document, and provide at future appointments   Follow Up Plan: The patient has been provided with contact information for the care management team and has been advised to call with any health related questions or concerns.       Patient agreed to services and verbal consent obtained.   Print copy of patient instructions, educational materials, and care plan provided in person.   [name], PharmD Clinical Pharmacist [facility] [phone]

## 2022-01-24 ENCOUNTER — Encounter: Payer: Self-pay | Admitting: Nurse Practitioner

## 2022-01-24 ENCOUNTER — Ambulatory Visit (INDEPENDENT_AMBULATORY_CARE_PROVIDER_SITE_OTHER): Payer: Medicare Other

## 2022-01-24 ENCOUNTER — Ambulatory Visit (INDEPENDENT_AMBULATORY_CARE_PROVIDER_SITE_OTHER): Payer: Medicare Other | Admitting: Nurse Practitioner

## 2022-01-24 VITALS — BP 130/80 | HR 93 | Temp 97.9°F | Ht 61.6 in | Wt 165.0 lb

## 2022-01-24 VITALS — BP 130/80 | HR 93 | Temp 97.9°F | Ht 61.6 in | Wt 165.4 lb

## 2022-01-24 DIAGNOSIS — Z794 Long term (current) use of insulin: Secondary | ICD-10-CM

## 2022-01-24 DIAGNOSIS — E1122 Type 2 diabetes mellitus with diabetic chronic kidney disease: Secondary | ICD-10-CM

## 2022-01-24 DIAGNOSIS — E559 Vitamin D deficiency, unspecified: Secondary | ICD-10-CM

## 2022-01-24 DIAGNOSIS — R5383 Other fatigue: Secondary | ICD-10-CM | POA: Diagnosis not present

## 2022-01-24 DIAGNOSIS — N1831 Chronic kidney disease, stage 3a: Secondary | ICD-10-CM

## 2022-01-24 DIAGNOSIS — Z79899 Other long term (current) drug therapy: Secondary | ICD-10-CM | POA: Diagnosis not present

## 2022-01-24 DIAGNOSIS — E782 Mixed hyperlipidemia: Secondary | ICD-10-CM | POA: Diagnosis not present

## 2022-01-24 DIAGNOSIS — I10 Essential (primary) hypertension: Secondary | ICD-10-CM

## 2022-01-24 DIAGNOSIS — I129 Hypertensive chronic kidney disease with stage 1 through stage 4 chronic kidney disease, or unspecified chronic kidney disease: Secondary | ICD-10-CM

## 2022-01-24 DIAGNOSIS — Z7985 Long-term (current) use of injectable non-insulin antidiabetic drugs: Secondary | ICD-10-CM

## 2022-01-24 DIAGNOSIS — E1169 Type 2 diabetes mellitus with other specified complication: Secondary | ICD-10-CM | POA: Diagnosis not present

## 2022-01-24 DIAGNOSIS — Z6830 Body mass index (BMI) 30.0-30.9, adult: Secondary | ICD-10-CM

## 2022-01-24 DIAGNOSIS — Z0000 Encounter for general adult medical examination without abnormal findings: Secondary | ICD-10-CM | POA: Diagnosis not present

## 2022-01-24 DIAGNOSIS — E785 Hyperlipidemia, unspecified: Secondary | ICD-10-CM

## 2022-01-24 DIAGNOSIS — E6609 Other obesity due to excess calories: Secondary | ICD-10-CM

## 2022-01-24 LAB — POCT URINALYSIS DIPSTICK
Bilirubin, UA: NEGATIVE
Glucose, UA: POSITIVE — AB
Ketones, UA: NEGATIVE
Nitrite, UA: NEGATIVE
Protein, UA: POSITIVE — AB
Spec Grav, UA: 1.02 (ref 1.010–1.025)
Urobilinogen, UA: 0.2 E.U./dL
pH, UA: 7.5 (ref 5.0–8.0)

## 2022-01-24 LAB — HEMOGLOBIN A1C
Est. average glucose Bld gHb Est-mCnc: 235 mg/dL
Hgb A1c MFr Bld: 9.8 % — ABNORMAL HIGH (ref 4.8–5.6)

## 2022-01-24 MED ORDER — VALSARTAN 40 MG PO TABS: 40.0000 mg | ORAL_TABLET | Freq: Every day | ORAL | 2 refills | Status: DC

## 2022-01-24 NOTE — Patient Instructions (Signed)
Ms. Gossen , Thank you for taking time to come for your Medicare Wellness Visit. I appreciate your ongoing commitment to your health goals. Please review the following plan we discussed and let me know if I can assist you in the future.   Screening recommendations/referrals: Colonoscopy: completed 12/12/2016 Mammogram: completed 04/07/2021, due 04/08/2022 Bone Density: completed 07/10/2019 Recommended yearly ophthalmology/optometry visit for glaucoma screening and checkup Recommended yearly dental visit for hygiene and checkup  Vaccinations: Influenza vaccine: due 03/27/2022 Pneumococcal vaccine: completed 03/24/2019 Tdap vaccine: completed 12/27/2017, due 12/28/2027 Shingles vaccine: completed   Covid-19: 05/24/2020, 10/02/2019, 09/21/2019  Advanced directives:  Advance directive discussed with you today. Even though you declined this today please call our office should you change your mind and we can give you the proper paperwork for you to fill out.  Conditions/risks identified: none  Next appointment: Follow up in one year for your annual wellness visit    Preventive Care 65 Years and Older, Female Preventive care refers to lifestyle choices and visits with your health care provider that can promote health and wellness. What does preventive care include? A yearly physical exam. This is also called an annual well check. Dental exams once or twice a year. Routine eye exams. Ask your health care provider how often you should have your eyes checked. Personal lifestyle choices, including: Daily care of your teeth and gums. Regular physical activity. Eating a healthy diet. Avoiding tobacco and drug use. Limiting alcohol use. Practicing safe sex. Taking low-dose aspirin every day. Taking vitamin and mineral supplements as recommended by your health care provider. What happens during an annual well check? The services and screenings done by your health care provider during your annual well  check will depend on your age, overall health, lifestyle risk factors, and family history of disease. Counseling  Your health care provider may ask you questions about your: Alcohol use. Tobacco use. Drug use. Emotional well-being. Home and relationship well-being. Sexual activity. Eating habits. History of falls. Memory and ability to understand (cognition). Work and work Statistician. Reproductive health. Screening  You may have the following tests or measurements: Height, weight, and BMI. Blood pressure. Lipid and cholesterol levels. These may be checked every 5 years, or more frequently if you are over 39 years old. Skin check. Lung cancer screening. You may have this screening every year starting at age 38 if you have a 30-pack-year history of smoking and currently smoke or have quit within the past 15 years. Fecal occult blood test (FOBT) of the stool. You may have this test every year starting at age 24. Flexible sigmoidoscopy or colonoscopy. You may have a sigmoidoscopy every 5 years or a colonoscopy every 10 years starting at age 13. Hepatitis C blood test. Hepatitis B blood test. Sexually transmitted disease (STD) testing. Diabetes screening. This is done by checking your blood sugar (glucose) after you have not eaten for a while (fasting). You may have this done every 1-3 years. Bone density scan. This is done to screen for osteoporosis. You may have this done starting at age 69. Mammogram. This may be done every 1-2 years. Talk to your health care provider about how often you should have regular mammograms. Talk with your health care provider about your test results, treatment options, and if necessary, the need for more tests. Vaccines  Your health care provider may recommend certain vaccines, such as: Influenza vaccine. This is recommended every year. Tetanus, diphtheria, and acellular pertussis (Tdap, Td) vaccine. You may need a Td booster  every 10 years. Zoster  vaccine. You may need this after age 67. Pneumococcal 13-valent conjugate (PCV13) vaccine. One dose is recommended after age 63. Pneumococcal polysaccharide (PPSV23) vaccine. One dose is recommended after age 58. Talk to your health care provider about which screenings and vaccines you need and how often you need them. This information is not intended to replace advice given to you by your health care provider. Make sure you discuss any questions you have with your health care provider. Document Released: 09/09/2015 Document Revised: 05/02/2016 Document Reviewed: 06/14/2015 Elsevier Interactive Patient Education  2017 [place] Prevention in the Home Falls can cause injuries. They can happen to people of all ages. There are many things you can do to make your home safe and to help prevent falls. What can I do on the outside of my home? Regularly fix the edges of walkways and driveways and fix any cracks. Remove anything that might make you trip as you walk through a door, such as a raised step or threshold. Trim any bushes or trees on the path to your home. Use bright outdoor lighting. Clear any walking paths of anything that might make someone trip, such as rocks or tools. Regularly check to see if handrails are loose or broken. Make sure that both sides of any steps have handrails. Any raised decks and porches should have guardrails on the edges. Have any leaves, snow, or ice cleared regularly. Use sand or salt on walking paths during winter. Clean up any spills in your garage right away. This includes oil or grease spills. What can I do in the bathroom? Use night lights. Install grab bars by the toilet and in the tub and shower. Do not use towel bars as grab bars. Use non-skid mats or decals in the tub or shower. If you need to sit down in the shower, use a plastic, non-slip stool. Keep the floor dry. Clean up any water that spills on the floor as soon as it happens. Remove  soap buildup in the tub or shower regularly. Attach bath mats securely with double-sided non-slip rug tape. Do not have throw rugs and other things on the floor that can make you trip. What can I do in the bedroom? Use night lights. Make sure that you have a light by your bed that is easy to reach. Do not use any sheets or blankets that are too big for your bed. They should not hang down onto the floor. Have a firm chair that has side arms. You can use this for support while you get dressed. Do not have throw rugs and other things on the floor that can make you trip. What can I do in the kitchen? Clean up any spills right away. Avoid walking on wet floors. Keep items that you use a lot in easy-to-reach places. If you need to reach something above you, use a strong step stool that has a grab bar. Keep electrical cords out of the way. Do not use floor polish or wax that makes floors slippery. If you must use wax, use non-skid floor wax. Do not have throw rugs and other things on the floor that can make you trip. What can I do with my stairs? Do not leave any items on the stairs. Make sure that there are handrails on both sides of the stairs and use them. Fix handrails that are broken or loose. Make sure that handrails are as long as the stairways. Check any carpeting to  make sure that it is firmly attached to the stairs. Fix any carpet that is loose or worn. Avoid having throw rugs at the top or bottom of the stairs. If you do have throw rugs, attach them to the floor with carpet tape. Make sure that you have a light switch at the top of the stairs and the bottom of the stairs. If you do not have them, ask someone to add them for you. What else can I do to help prevent falls? Wear shoes that: Do not have high heels. Have rubber bottoms. Are comfortable and fit you well. Are closed at the toe. Do not wear sandals. If you use a stepladder: Make sure that it is fully opened. Do not climb a  closed stepladder. Make sure that both sides of the stepladder are locked into place. Ask someone to hold it for you, if possible. Clearly mark and make sure that you can see: Any grab bars or handrails. First and last steps. Where the edge of each step is. Use tools that help you move around (mobility aids) if they are needed. These include: Canes. Walkers. Scooters. Crutches. Turn on the lights when you go into a dark area. Replace any light bulbs as soon as they burn out. Set up your furniture so you have a clear path. Avoid moving your furniture around. If any of your floors are uneven, fix them. If there are any pets around you, be aware of where they are. Review your medicines with your doctor. Some medicines can make you feel dizzy. This can increase your chance of falling. Ask your doctor what other things that you can do to help prevent falls. This information is not intended to replace advice given to you by your health care provider. Make sure you discuss any questions you have with your health care provider. Document Released: 06/09/2009 Document Revised: 01/19/2016 Document Reviewed: 09/17/2014 Elsevier Interactive Patient Education  2017 [employer].

## 2022-01-24 NOTE — Progress Notes (Signed)
Subjective:   [name] is a 71 y.o. female who presents for Medicare Annual (Subsequent) preventive examination.  Review of Systems     Cardiac Risk Factors include: advanced age (>52mn, >>67women);diabetes mellitus;dyslipidemia;hypertension;obesity (BMI >30kg/m2)     Objective:    Today's Vitals   01/24/22 0946 01/24/22 1016  BP: (!) 142/68 130/80  Pulse: 93   Temp: 97.9 F (36.6 C)   TempSrc: Oral   SpO2: 97%   Weight: 165 lb 6.4 oz (75 kg)   Height: 5' 1.6" (1.565 m)    Body mass index is 30.65 kg/m.     01/24/2022   10:00 AM 02/03/2021   10:41 AM 01/11/2021   11:43 AM 11/29/2020    4:17 PM 02/19/2020    9:54 AM 02/13/2020    9:51 AM 02/10/2020    8:01 PM  Advanced Directives  Does Patient Have a Medical Advance Directive? _0  No No  Would patient like information on creating a medical advance directive? No - Patient declined No - Patient declined   No - Patient declined No - Patient declined No - Patient declined    Current Medications (verified) Outpatient Encounter Medications as of 01/24/2022  Medication Sig   amLODipine (NORVASC) 5 MG tablet Take 1 tablet (5 mg total) by mouth at bedtime.   aspirin EC 81 MG EC tablet Take 1 tablet (81 mg total) by mouth daily. Swallow whole.   atorvastatin (LIPITOR) 10 MG tablet Take 1 tablet (10 mg total) by mouth daily.   blood glucose meter kit and supplies KIT Dispense based on patient and insurance preference. Check blood sugars 4 times daily E11.69   Continuous Blood Gluc Receiver (FREESTYLE LIBRE READER) DEVI 1 each by Does not apply route See admin instructions. CGM device-Freestyle Libre   Continuous Blood Gluc Sensor (FREESTYLE LIBRE 14 DAY SENSOR) MISC 1 each by Does not apply route See admin instructions. CGM 14-day sensors; send refills to [employer] [facility](in Epic)   Finerenone (KERENDIA) 10 MG TABS Take 1 tablet by mouth daily.   HUMALOG 100 UNIT/ML injection Inject 0.1 mLs (10 Units total) into  the skin 3 (three) times daily with meals.   insulin degludec (TRESIBA FLEXTOUCH) 100 UNIT/ML FlexTouch Pen Inject 27 Units into the skin daily.   Insulin Pen Needle (BD PEN NEEDLE MICRO U/F) 32G X 6 MM MISC DX:E11.65 USE TO CHECK BLOOD SUGARS THREE TIMES A DAY   Lancets Misc. MISC 1 each by Does not apply route 4 (four) times daily -  with meals and at bedtime.   linaclotide (LINZESS) 145 MCG CAPS capsule Take 1 capsule (145 mcg total) by mouth daily before breakfast.   Multiple Vitamin (MULTIVITAMIN WITH MINERALS) TABS tablet Take 1 tablet by mouth daily.   Multiple Vitamin (MULTIVITAMIN) tablet Take 1 tablet by mouth daily.   nystatin cream (MYCOSTATIN) Apply to affected area 2 times daily   ONETOUCH VERIO test strip USE TO test blood sugar FOUR TIMES DAILY   pantoprazole (PROTONIX) 40 MG tablet TAKE ONE TABLET BY MOUTH ONCE DAILY   pregabalin (LYRICA) 50 MG capsule TAKE ONE CAPSULE BY MOUTH BEFORE BREAKFAST and TAKE ONE CAPSULE BY MOUTH EVERY EVENING and TAKE ONE CAPSULE BY MOUTH EVERYDAY AT BEDTIME   Semaglutide, 1 MG/DOSE, (OZEMPIC, 1 MG/DOSE,) 4 MG/3ML SOPN Inject 1 mg into the skin once a week.   Vibegron (GEMTESA) 75 MG TABS Take 75 mg by mouth daily.   Vitamin D, Ergocalciferol, (DRISDOL) 1.25  MG (50000 UNIT) CAPS capsule TAKE ONE CAPSULE BY MOUTH ONCE WEEKLY ON WEDNESDAY (Patient not taking: Reported on 01/24/2022)   No facility-administered encounter medications on file as of 01/24/2022.    Allergies (verified) Pollen extract, Tomato, and Codeine   History: Past Medical History:  Diagnosis Date   Aortic stenosis    mild on 04/07/19 echo   Arthritis    L knee, hands, back    Asthma    Diabetes mellitus type 2, insulin dependent (HCC)    Fibromyalgia    Full dentures    Full dentures    GERD (gastroesophageal reflux disease)    H/O hiatal hernia    Headache    h/o migraines - was followed for a time with a wellness doctor   History of esophageal dilatation    History of  rhabdomyolysis    03/ 2015   Hyperlipidemia    Hypertension    Insulin pump in place    since 12/ 2014--  [employer]   Lumbar disc herniation    right leg weakness   Osteoarthritis of left knee, primary localized 08/17/2014   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   Renal insufficiency    Rhabdomyolysis 11/15/2013   SUI (stress urinary incontinence, female)    Syncope 12/27/2017   Wears glasses    Wears glasses    Past Surgical History:  Procedure Laterality Date   BLADDER SUSPENSION N/A 03/09/2014   Procedure: CYSTOSCOPY/SLING;  Surgeon: Reece Packer, MD;  Location: [facility];  Service: Urology;  Laterality: N/A;   CARDIAC CATHETERIZATION  07-20-2009   [facility] [place]  NORMAL  LVEF/  NORMAL CORONARY AND RENAL ARTERIES   CARPAL TUNNEL RELEASE Right 2000   CHOLECYSTECTOMY  1990   COLONOSCOPY WITH ESOPHAGOGASTRODUODENOSCOPY (EGD)  06-18-2002   ESOPHAGOGASTRODUODENOSCOPY (EGD) WITH ESOPHAGEAL DILATION  06-12-2010   EXCISION LEFT UPPER ARM MASS  01-29-2014   EYE SURGERY  2010   laser on R eye   INTERSTIM IMPLANT REMOVAL N/A 03/06/2019   Procedure: REMOVAL OF Barrie Lyme IMPLANT;  Surgeon: Bjorn Loser, MD;  Location: [facility];  Service: Urology;  Laterality: N/A;   KNEE ARTHROSCOPY Right 1989  &  2002   LUMBAR LAMINECTOMY N/A 02/19/2020   Procedure: right L4 hemiaminectomy, microdiscectomy;  Surgeon: Marybelle Killings, MD;  Location: [facility];  Service: Orthopedics;  Laterality: N/A;   MULTIPLE TOOTH EXTRACTIONS     NEGATIVE SLEEP STUDY  2012   PER PT   PARTIAL KNEE ARTHROPLASTY Left 08/17/2014   Procedure: LEFT UNICOMPARTMENTAL KNEE;  Surgeon: Johnny Bridge, MD;  Location: [facility];  Service: Orthopedics;  Laterality: Left;   Family History  Problem Relation Age of Onset   Diabetes Mother    Hypertension Mother    Lung cancer Mother        smoked   Diabetes Father    Hypertension Father    Lung cancer Father        smoked   Diabetes Brother    Heart failure  Sister    Diabetes Maternal Grandmother    Cancer Maternal Grandmother    Social History   Socioeconomic History   Marital status: Divorced    Spouse name: Not on file   Number of children: Not on file   Years of education: Not on file   Highest education level: Not on file  Occupational History   Occupation: disability  Tobacco Use   Smoking status: Never   Smokeless tobacco: Never  Vaping Use   Vaping Use: Never used  Substance and Sexual Activity   Alcohol use: No   Drug use: No   Sexual activity: Not Currently  Other Topics Concern   Not on file  Social History Narrative   Not on file   Social Determinants of Health   Financial Resource Strain: Low Risk    Difficulty of Paying Living Expenses: Not hard at all  Food Insecurity: No Food Insecurity   Worried About Charity fundraiser in the Last Year: Never true   Lillie in the Last Year: Never true  Transportation Needs: No Transportation Needs   Lack of Transportation (Medical): No   Lack of Transportation (Non-Medical): No  Physical Activity: Sufficiently Active   Days of Exercise per Week: 7 days   Minutes of Exercise per Session: 30 min  Stress: No Stress Concern Present   Feeling of Stress : Not at all  Social Connections: Not on file    Tobacco Counseling Counseling given: Not Answered   Clinical Intake:  Pre-visit preparation completed: Yes  Pain : No/denies pain     Nutritional Status: BMI > 30  Obese Nutritional Risks: Nausea/ vomitting/ diarrhea (last week diarrhea and vomiting resolved) Diabetes: Yes  How often do you need to have someone help you when you read instructions, pamphlets, or other written materials from your doctor or pharmacy?: 1 - Never What is the last grade level you completed in school?: 12th grade  Diabetic? Yes Nutrition Risk Assessment:  Has the patient had any N/V/D within the last 2 months?  Yes  Does the patient have any non-healing wounds?  No  Has  the patient had any unintentional weight loss or weight gain?  No   Diabetes:  Is the patient diabetic?  Yes  If diabetic, was a CBG obtained today?  No  Did the patient bring in their glucometer from home?  No  How often do you monitor your CBG's? frequently.   Financial Strains and Diabetes Management:  Are you having any financial strains with the device, your supplies or your medication? No .  Does the patient want to be seen by Chronic Care Management for management of their diabetes?  No  Would the patient like to be referred to a Nutritionist or for Diabetic Management?  No   Diabetic Exams:  Diabetic Eye Exam: Completed 11/2021 Diabetic Foot Exam: Completed 06/20/2021   Interpreter Needed?: No  Information entered by :: NAllen LPN   Activities of Daily Living    01/24/2022   10:05 AM  In your present state of health, do you have any difficulty performing the following activities:  Hearing? 0  Vision? 0  Difficulty concentrating or making decisions? 0  Walking or climbing stairs? 1  Dressing or bathing? 0  Doing errands, shopping? 0  Preparing Food and eating ? N  Using the Toilet? N  In the past six months, have you accidently leaked urine? Y  Do you have problems with loss of bowel control? N  Managing your Medications? N  Managing your Finances? N  Housekeeping or managing your Housekeeping? N    Patient Care Team: [name], FNP as PCP - General (General Practice) Elayne Snare, MD as Consulting Physician (Endocrinology) Bjorn Loser, MD as Consulting Physician (Urology) Juanita Craver, MD as Consulting Physician (Gastroenterology) Rex Kras Claudette Stapler, RN as Case Manager Mayford Knife, [facility] (Pharmacist)  Indicate any recent Medical Services you may have received from other than  [facility] providers in the past year (date may be approximate).     Assessment:   This is a routine wellness examination for Terresa.  Hearing/Vision screen Vision  Screening - Comments:: Regular eye exams, Dr. Idolina Primer  Dietary issues and exercise activities discussed: Current Exercise Habits: Home exercise routine, Type of exercise: walking, Time (Minutes): 30, Frequency (Times/Week): 7, Weekly Exercise (Minutes/Week): 210   Goals Addressed             This Visit's Progress    Patient Stated       01/24/2022, wants to get energy up       Depression Screen    01/24/2022   10:05 AM 01/11/2021   11:45 AM 12/31/2019    2:17 PM 10/22/2019    3:33 PM 09/03/2019    4:09 PM 04/14/2019    2:38 PM 03/24/2019    8:56 AM  PHQ 2/9 Scores  PHQ - 2 Score 0 0 0 0 0 0 0  PHQ- 9 Score   0        Fall Risk    01/24/2022   10:04 AM 01/11/2021   11:44 AM 12/31/2019    2:16 PM 10/22/2019    3:34 PM 09/03/2019    4:08 PM  Fall Risk   Falls in the past year? 0 1 0 0 0  Comment  passed out     Number falls in past yr: 0 0     Injury with Fall? 0 0     Risk for fall due to : Medication side effect Impaired mobility;Medication side effect Medication side effect    Follow up Falls evaluation completed;Education provided;Falls prevention discussed Falls evaluation completed;Education provided;Falls prevention discussed Falls evaluation completed;Education provided;Falls prevention discussed      FALL RISK PREVENTION PERTAINING TO THE HOME:  Any stairs in or around the home? Yes  If so, are there any without handrails? No  Home free of loose throw rugs in walkways, pet beds, electrical cords, etc? Yes  Adequate lighting in your home to reduce risk of falls? Yes   ASSISTIVE DEVICES UTILIZED TO PREVENT FALLS:  Life alert? No  Use of a cane, walker or w/c? Yes  Grab bars in the bathroom? Yes  Shower chair or bench in shower? Yes  Elevated toilet seat or a handicapped toilet? Yes   TIMED UP AND GO:  Was the test performed? No .    Gait slow and steady with assistive device  Cognitive Function:        01/24/2022   10:07 AM 01/11/2021   11:48 AM 12/31/2019     2:20 PM 11/20/2018    2:16 PM  6CIT Screen  What Year? 0 points 0 points 0 points 0 points  What month? 0 points 0 points 0 points 0 points  What time? 0 points 0 points 0 points 0 points  Count back from 20 0 points 0 points 0 points 0 points  Months in reverse 0 points 0 points 0 points 0 points  Repeat phrase 2 points 0 points 2 points 0 points  Total Score 2 points 0 points 2 points 0 points    Immunizations Immunization History  Administered Date(s) Administered   Fluad Quad(high Dose 65+) 05/03/2020, 05/03/2021   Influenza Whole 06/27/2009, 06/13/2010   Influenza, High Dose Seasonal PF 07/22/2019   Influenza,inj,Quad PF,6+ Mos 06/03/2017   Influenza-Unspecified 03/27/2013, 03/27/2014, 05/18/2015   PFIZER(Purple Top)SARS-COV-2 Vaccination 09/21/2019, 10/02/2019, 05/24/2020   Pneumococcal Conjugate-13 07/24/2017   Pneumococcal Polysaccharide-23  08/27/2006, 11/29/2009, 03/24/2019   Td 10/18/2009   Tdap 12/27/2017   Zoster Recombinat (Shingrix) 11/28/2016, 02/11/2017    TDAP status: Up to date  Flu Vaccine status: Up to date  Pneumococcal vaccine status: Up to date  Covid-19 vaccine status: Completed vaccines  Qualifies for Shingles Vaccine? Yes   Zostavax completed No   Shingrix Completed?: Yes  Screening Tests Health Maintenance  Topic Date Due   COVID-19 Vaccine (4 - Booster) 07/19/2020   URINE MICROALBUMIN  12/13/2021   HEMOGLOBIN A1C  03/26/2022   INFLUENZA VACCINE  03/27/2022   FOOT EXAM  06/20/2022   OPHTHALMOLOGY EXAM  12/09/2022   MAMMOGRAM  04/08/2023   COLONOSCOPY (Pts 45-33yr Insurance coverage will need to be confirmed)  12/13/2026   TETANUS/TDAP  12/28/2027   Pneumonia Vaccine 70 Years old  Completed   DEXA SCAN  Completed   Hepatitis C Screening  Completed   Zoster Vaccines- Shingrix  Completed   HPV VACCINES  Aged Out    Health Maintenance  Health Maintenance Due  Topic Date Due   COVID-19 Vaccine (4 - Booster) 07/19/2020   URINE  MICROALBUMIN  12/13/2021    Colorectal cancer screening: Type of screening: Colonoscopy. Completed 4/18/20218. Repeat every 5 years  Mammogram status: Completed 04/07/2021. Repeat every year  Bone Density status: Completed 07/10/2019.  Lung Cancer Screening: (Low Dose CT Chest recommended if Age 70-80years, 30 pack-year currently smoking OR have quit w/in 15years.) does not qualify.   Lung Cancer Screening Referral: no  Additional Screening:  Hepatitis C Screening: does qualify; Completed 09/02/2021  Vision Screening: Recommended annual ophthalmology exams for early detection of glaucoma and other disorders of the eye. Is the patient up to date with their annual eye exam?  Yes  Who is the provider or what is the name of the office in which the patient attends annual eye exams? Dr. DIdolina PrimerIf pt is not established with a provider, would they like to be referred to a provider to establish care? No .   Dental Screening: Recommended annual dental exams for proper oral hygiene  Community Resource Referral / Chronic Care Management: CRR required this visit?  No   CCM required this visit?  No      Plan:     I have personally reviewed and noted the following in the patient's chart:   Medical and social history Use of alcohol, tobacco or illicit drugs  Current medications and supplements including opioid prescriptions.  Functional ability and status Nutritional status Physical activity Advanced directives List of other physicians Hospitalizations, surgeries, and ER visits in previous 12 months Vitals Screenings to include cognitive, depression, and falls Referrals and appointments  In addition, I have reviewed and discussed with patient certain preventive protocols, quality metrics, and best practice recommendations. A written personalized care plan for preventive services as well as general preventive health recommendations were provided to patient.     NKellie Simmering  LPN   [id number]  Nurse Notes: none

## 2022-01-24 NOTE — Progress Notes (Signed)
I,Tianna Badgett,acting as a Education administrator for [employer], FNP.,have documented all relevant documentation on the behalf of [name], FNP,as directed by  [name], FNP while in the presence of [name], [place].  This visit occurred during the SARS-CoV-2 public health emergency.  Safety protocols were in place, including screening questions prior to the visit, additional usage of staff PPE, and extensive cleaning of exam room while observing appropriate contact time as indicated for disinfecting solutions.  Subjective:     Patient ID: [name] , female    DOB: [birth year] , 71 y.o.   MRN: [id number]   Chief Complaint  Patient presents with   Diabetes    HPI  Pt presents today for DM & HTN f/u. She is now at [facility] with Roselyn Reef FNP for Endocrinology. She continues to have variability of her blood sugars but improving. She is now taking the regular vitamin d vs high dose.   Diabetes She presents for her follow-up diabetic visit. She has type 2 diabetes mellitus. Her disease course has been improving. There are no hypoglycemic associated symptoms. Associated symptoms include fatigue. Hypoglycemia complications include nocturnal hypoglycemia. Risk factors for coronary artery disease include sedentary lifestyle and obesity. Current diabetic treatment includes oral agent (dual therapy). When asked about meal planning, she reported none. She has not had a previous visit with a dietitian. She rarely participates in exercise. (She feels her blood sugars are improving. ) Eye exam is current.     Past Medical History:  Diagnosis Date   Aortic stenosis    mild on 04/07/19 echo   Arthritis    L knee, hands, back    Asthma    Diabetes mellitus type 2, insulin dependent (HCC)    Fibromyalgia    Full dentures    Full dentures    GERD (gastroesophageal reflux disease)    H/O hiatal hernia    Headache    h/o migraines - was followed for a time with a wellness doctor   History of  esophageal dilatation    History of rhabdomyolysis    03/ 2015   Hyperlipidemia    Hypertension    Insulin pump in place    since 12/ 2014--  [employer]   Lumbar disc herniation    right leg weakness   Osteoarthritis of left knee, primary localized 08/17/2014   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   Renal insufficiency    Rhabdomyolysis 11/15/2013   SUI (stress urinary incontinence, female)    Syncope 12/27/2017   Wears glasses    Wears glasses      Family History  Problem Relation Age of Onset   Diabetes Mother    Hypertension Mother    Lung cancer Mother        smoked   Diabetes Father    Hypertension Father    Lung cancer Father        smoked   Diabetes Brother    Heart failure Sister    Diabetes Maternal Grandmother    Cancer Maternal Grandmother      Current Outpatient Medications:    valsartan (DIOVAN) 40 MG tablet, Take 1 tablet (40 mg total) by mouth daily., Disp: 30 tablet, Rfl: 2   amLODipine (NORVASC) 5 MG tablet, Take 1 tablet (5 mg total) by mouth at bedtime., Disp: 90 tablet, Rfl: 1   aspirin EC 81 MG EC tablet, Take 1 tablet (81 mg total) by mouth daily. Swallow whole., Disp: 30 tablet, Rfl: 11   atorvastatin (LIPITOR) 10 MG  tablet, Take 1 tablet (10 mg total) by mouth daily., Disp: 90 tablet, Rfl: 3   blood glucose meter kit and supplies KIT, Dispense based on patient and insurance preference. Check blood sugars 4 times daily E11.69, Disp: 1 each, Rfl: 1   Continuous Blood Gluc Receiver (FREESTYLE LIBRE READER) DEVI, 1 each by Does not apply route See admin instructions. CGM device-Freestyle Libre, Disp: , Rfl:    Continuous Blood Gluc Sensor (FREESTYLE LIBRE 14 DAY SENSOR) MISC, 1 each by Does not apply route See admin instructions. CGM 14-day sensors; send refills to [employer] (in Epic), Disp: , Rfl:    Finerenone (KERENDIA) 10 MG TABS, Take 1 tablet by mouth daily., Disp: 90 tablet, Rfl: 2   HUMALOG 100 UNIT/ML injection, Inject 0.1 mLs (10 Units total) into the  skin 3 (three) times daily with meals., Disp: 10 mL, Rfl: 3   insulin degludec (TRESIBA FLEXTOUCH) 100 UNIT/ML FlexTouch Pen, Inject 27 Units into the skin daily., Disp: 9 mL, Rfl: 1   Insulin Pen Needle (BD PEN NEEDLE MICRO U/F) 32G X 6 MM MISC, DX:E11.65 USE TO CHECK BLOOD SUGARS THREE TIMES A DAY, Disp: 100 each, Rfl: 6   Lancets Misc. MISC, 1 each by Does not apply route 4 (four) times daily -  with meals and at bedtime., Disp: 300 each, Rfl: 3   linaclotide (LINZESS) 145 MCG CAPS capsule, Take 1 capsule (145 mcg total) by mouth daily before breakfast., Disp: 90 capsule, Rfl: 1   Multiple Vitamin (MULTIVITAMIN WITH MINERALS) TABS tablet, Take 1 tablet by mouth daily., Disp: , Rfl:    Multiple Vitamin (MULTIVITAMIN) tablet, Take 1 tablet by mouth daily., Disp: , Rfl:    nystatin cream (MYCOSTATIN), Apply to affected area 2 times daily, Disp: 80 g, Rfl: 2   ONETOUCH VERIO test strip, USE TO test blood sugar FOUR TIMES DAILY, Disp: 100 strip, Rfl: 1   pantoprazole (PROTONIX) 40 MG tablet, TAKE ONE TABLET BY MOUTH ONCE DAILY, Disp: 90 tablet, Rfl: 1   pregabalin (LYRICA) 50 MG capsule, TAKE ONE CAPSULE BY MOUTH BEFORE BREAKFAST and TAKE ONE CAPSULE BY MOUTH EVERY EVENING and TAKE ONE CAPSULE BY MOUTH EVERYDAY AT BEDTIME, Disp: 90 capsule, Rfl: 5   Semaglutide, 1 MG/DOSE, (OZEMPIC, 1 MG/DOSE,) 4 MG/3ML SOPN, Inject 1 mg into the skin once a week., Disp: 3 mL, Rfl: 2   Vibegron (GEMTESA) 75 MG TABS, Take 75 mg by mouth daily., Disp: , Rfl:    Vitamin D, Ergocalciferol, (DRISDOL) 1.25 MG (50000 UNIT) CAPS capsule, TAKE ONE CAPSULE BY MOUTH ONCE WEEKLY ON WEDNESDAY (Patient not taking: Reported on 01/24/2022), Disp: 13 capsule, Rfl: 1   Allergies  Allergen Reactions   Pollen Extract Other (See Comments)    Congestion/sneezing   Tomato Hives and Itching   Codeine Hives, Itching and Nausea And Vomiting     Review of Systems  Constitutional:  Positive for fatigue.  Respiratory: Negative.     Cardiovascular: Negative.   Gastrointestinal: Negative.   Neurological: Negative.      Today's Vitals   01/24/22 1019  BP: 130/80  Pulse: 93  Temp: 97.9 F (36.6 C)  TempSrc: Oral  Weight: 165 lb (74.8 kg)  Height: 5' 1.6" (1.565 m)   Body mass index is 30.57 kg/m.  Wt Readings from Last 3 Encounters:  01/24/22 165 lb (74.8 kg)  01/24/22 165 lb 6.4 oz (75 kg)  09/26/21 173 lb 9.6 oz (78.7 kg)    Objective:  Physical Exam Vitals  reviewed.  Constitutional:      General: She is not in acute distress.    Appearance: Normal appearance. She is well-developed. She is obese.  Cardiovascular:     Rate and Rhythm: Normal rate and regular rhythm.     Pulses: Normal pulses.     Heart sounds: Normal heart sounds. No murmur heard. Pulmonary:     Effort: Pulmonary effort is normal.     Breath sounds: Normal breath sounds.  Chest:     Chest wall: No tenderness.  Musculoskeletal:        General: Normal range of motion.  Skin:    General: Skin is warm and dry.     Capillary Refill: Capillary refill takes less than 2 seconds.  Neurological:     General: No focal deficit present.     Mental Status: She is alert and oriented to person, place, and time.  Psychiatric:        Mood and Affect: Mood normal.        Behavior: Behavior normal.        Thought Content: Thought content normal.        Judgment: Judgment normal.         Assessment And Plan:     1. Essential hypertension Comments: Blood pressure fairly controlled. Continue current medications will start valsartan low dose for kidney protection - valsartan (DIOVAN) 40 MG tablet; Take 1 tablet (40 mg total) by mouth daily.  Dispense: 30 tablet; Refill: 2  2. Mixed hyperlipidemia Comments: Cholesterol levels are stable, continue statin. Tolerating well. - Lipid panel  3. Vitamin D deficiency - VITAMIN D 25 Hydroxy (Vit-D Deficiency, Fractures)  4. Other fatigue Comments: Continues to have fatigue this is chronic for  her, metabolic labs have been normal other than poorly controlled HgbA1c.  - Vitamin B12 - TSH  5. Type 2 diabetes mellitus with stage 3a chronic kidney disease, unspecified whether long term insulin use ([place]) Comments: HgbA1c improved at her last visit and her blood sugars are improving as well. Continue follow up with Endocrinology - Renal function panel with eGFR - Ambulatory referral to Nephrology - Hemoglobin A1c  6. Class 1 obesity due to excess calories with serious comorbidity and body mass index (BMI) of 30.0 to 30.9 in adult She is encouraged to strive for BMI less than 30 to decrease cardiac risk. Advised to aim for at least 150 minutes of exercise per week.  7. Other long term (current) drug therapy - CBC     Patient was given opportunity to ask questions. Patient verbalized understanding of the plan and was able to repeat key elements of the plan. All questions were answered to their satisfaction.  [name], FNP   I, [name], FNP, have reviewed all documentation for this visit. The documentation on 01/24/22 for the exam, diagnosis, procedures, and orders are all accurate and complete.   IF YOU HAVE BEEN REFERRED TO A SPECIALIST, IT MAY TAKE 1-2 WEEKS TO SCHEDULE/PROCESS THE REFERRAL. IF YOU HAVE NOT HEARD FROM US/SPECIALIST IN TWO WEEKS, PLEASE GIVE [place] A CALL AT [phone].   THE PATIENT IS ENCOURAGED TO PRACTICE SOCIAL DISTANCING DUE TO THE COVID-19 PANDEMIC.

## 2022-01-24 NOTE — Patient Instructions (Signed)

## 2022-01-25 DIAGNOSIS — N3020 Other chronic cystitis without hematuria: Secondary | ICD-10-CM | POA: Diagnosis not present

## 2022-01-25 LAB — CBC
Hematocrit: 43.7 % (ref 34.0–46.6)
Hemoglobin: 14 g/dL (ref 11.1–15.9)
MCH: 28.7 pg (ref 26.6–33.0)
MCHC: 32 g/dL (ref 31.5–35.7)
MCV: 90 fL (ref 79–97)
Platelets: 285 10*3/uL (ref 150–450)
RBC: 4.88 x10E6/uL (ref 3.77–5.28)
RDW: 12.1 % (ref 11.7–15.4)
WBC: 6.3 10*3/uL (ref 3.4–10.8)

## 2022-01-25 LAB — RENAL FUNCTION PANEL
Albumin: 4 g/dL (ref 3.8–4.8)
BUN/Creatinine Ratio: 12 (ref 12–28)
BUN: 16 mg/dL (ref 8–27)
CO2: 26 mmol/L (ref 20–29)
Calcium: 9.8 mg/dL (ref 8.7–10.3)
Chloride: 100 mmol/L (ref 96–106)
Creatinine, Ser: 1.37 mg/dL — ABNORMAL HIGH (ref 0.57–1.00)
Glucose: 200 mg/dL — ABNORMAL HIGH (ref 70–99)
Phosphorus: 3.3 mg/dL (ref 3.0–4.3)
Potassium: 4.9 mmol/L (ref 3.5–5.2)
Sodium: 141 mmol/L (ref 134–144)
eGFR: 42 mL/min/{1.73_m2} — ABNORMAL LOW (ref >59)

## 2022-01-25 LAB — MICROALBUMIN / CREATININE URINE RATIO
Creatinine, Urine: 88.1 mg/dL
Microalb/Creat Ratio: 452 mg/g creat — ABNORMAL HIGH (ref 0–29)
Microalbumin, Urine: 397.8 ug/mL

## 2022-01-25 LAB — LIPID PANEL
Chol/HDL Ratio: 3.5 ratio (ref 0.0–4.4)
Cholesterol, Total: 188 mg/dL (ref 100–199)
HDL: 53 mg/dL (ref >39)
LDL Chol Calc (NIH): 120 mg/dL — ABNORMAL HIGH (ref 0–99)
Triglycerides: 82 mg/dL (ref 0–149)
VLDL Cholesterol Cal: 15 mg/dL (ref 5–40)

## 2022-01-25 LAB — VITAMIN D 25 HYDROXY (VIT D DEFICIENCY, FRACTURES): Vit D, 25-Hydroxy: 86.5 ng/mL (ref 30.0–100.0)

## 2022-01-25 LAB — TSH: TSH: 1.61 u[IU]/mL (ref 0.450–4.500)

## 2022-01-25 LAB — VITAMIN B12: Vitamin B-12: 407 pg/mL (ref 232–1245)

## 2022-01-26 ENCOUNTER — Telehealth: Payer: Self-pay

## 2022-01-26 ENCOUNTER — Telehealth: Payer: Medicare Other

## 2022-01-26 NOTE — Telephone Encounter (Signed)
  Care Management   Follow Up Note   01/26/2022 Name: [name] MRN: [id number] DOB: 07-23-1951   Referred by: [name], FNP Reason for referral : Chronic Care Management (RN CM Follow up call )   An unsuccessful telephone outreach was attempted today. The patient was referred to the case management team for assistance with care management and care coordination.   Follow Up Plan: A HIPPA compliant phone message was left for the patient providing contact information and requesting a return call.   [name], RN, BSN, CCM Care Management Coordinator [place] [facility]  Direct Phone: [phone]

## 2022-02-02 DIAGNOSIS — E118 Type 2 diabetes mellitus with unspecified complications: Secondary | ICD-10-CM | POA: Diagnosis not present

## 2022-02-02 DIAGNOSIS — Z794 Long term (current) use of insulin: Secondary | ICD-10-CM | POA: Diagnosis not present

## 2022-02-07 DIAGNOSIS — Z794 Long term (current) use of insulin: Secondary | ICD-10-CM | POA: Diagnosis not present

## 2022-02-07 DIAGNOSIS — Z7985 Long-term (current) use of injectable non-insulin antidiabetic drugs: Secondary | ICD-10-CM | POA: Diagnosis not present

## 2022-02-07 DIAGNOSIS — E1165 Type 2 diabetes mellitus with hyperglycemia: Secondary | ICD-10-CM | POA: Diagnosis not present

## 2022-02-07 DIAGNOSIS — I10 Essential (primary) hypertension: Secondary | ICD-10-CM | POA: Diagnosis not present

## 2022-02-12 ENCOUNTER — Telehealth: Payer: Self-pay

## 2022-02-12 ENCOUNTER — Telehealth: Payer: Medicare Other

## 2022-02-12 NOTE — Telephone Encounter (Signed)
  Care Management   Follow Up Note   02/12/2022 Name: [name] MRN: [id number] DOB: 09-Jun-1951   Referred by: [name], FNP Reason for referral : Chronic Care Management (RN CM Follow up call #2)   A second unsuccessful telephone outreach was attempted today. The patient was referred to the case management team for assistance with care management and care coordination.   Follow Up Plan: Telephone follow up appointment with care management team member scheduled for: 02/23/22  [name], RN, BSN, CCM Care Management Coordinator [place] [facility]  Direct Phone: [phone]

## 2022-02-13 ENCOUNTER — Ambulatory Visit: Payer: Medicare Other

## 2022-02-16 ENCOUNTER — Telehealth: Payer: Self-pay

## 2022-02-16 NOTE — Chronic Care Management (AMB) (Signed)
Chronic Care Management Pharmacy Assistant   Name: [name]  MRN: [id number] DOB: [birth year]  Reason for Encounter: Medication Review/ Medication coordination  Recent office visits:  01-24-2022 [name], [name]. A1C= 9.8. LDL= 120. Glucose= 200, Creatinine= 1.37, eGFR= 42. Referral placed to nephrology. START valsartan 40 mg daily.   01-24-2022 Gwendolyn Simmering, LPN. Medicare annual wellness visit.  Recent consult visits:  02-07-2022 Gwendolyn Bellows, FNP (Endo). No changes.  Hospital visits:  None in previous 6 months  Medications: Outpatient Encounter Medications as of 02/16/2022  Medication Sig   amLODipine (NORVASC) 5 MG tablet Take 1 tablet (5 mg total) by mouth at bedtime.   aspirin EC 81 MG EC tablet Take 1 tablet (81 mg total) by mouth daily. Swallow whole.   atorvastatin (LIPITOR) 10 MG tablet Take 1 tablet (10 mg total) by mouth daily.   blood glucose meter kit and supplies KIT Dispense based on patient and insurance preference. Check blood sugars 4 times daily E11.69   Continuous Blood Gluc Receiver (FREESTYLE LIBRE READER) DEVI 1 each by Does not apply route See admin instructions. CGM device-Freestyle Libre   Continuous Blood Gluc Sensor (FREESTYLE LIBRE 14 DAY SENSOR) MISC 1 each by Does not apply route See admin instructions. CGM 14-day sensors; send refills to [employer] (in Epic)   Finerenone (KERENDIA) 10 MG TABS Take 1 tablet by mouth daily.   HUMALOG 100 UNIT/ML injection Inject 0.1 mLs (10 Units total) into the skin 3 (three) times daily with meals.   insulin degludec (TRESIBA FLEXTOUCH) 100 UNIT/ML FlexTouch Pen Inject 27 Units into the skin daily.   Insulin Pen Needle (BD PEN NEEDLE MICRO U/F) 32G X 6 MM MISC DX:E11.65 USE TO CHECK BLOOD SUGARS THREE TIMES A DAY   Lancets Misc. MISC 1 each by Does not apply route 4 (four) times daily -  with meals and at bedtime.   linaclotide (LINZESS) 145 MCG CAPS capsule Take 1 capsule (145 mcg  total) by mouth daily before breakfast.   Multiple Vitamin (MULTIVITAMIN WITH MINERALS) TABS tablet Take 1 tablet by mouth daily.   Multiple Vitamin (MULTIVITAMIN) tablet Take 1 tablet by mouth daily.   nystatin cream (MYCOSTATIN) Apply to affected area 2 times daily   ONETOUCH VERIO test strip USE TO test blood sugar FOUR TIMES DAILY   pantoprazole (PROTONIX) 40 MG tablet TAKE ONE TABLET BY MOUTH ONCE DAILY   pregabalin (LYRICA) 50 MG capsule TAKE ONE CAPSULE BY MOUTH BEFORE BREAKFAST and TAKE ONE CAPSULE BY MOUTH EVERY EVENING and TAKE ONE CAPSULE BY MOUTH EVERYDAY AT BEDTIME   Semaglutide, 1 MG/DOSE, (OZEMPIC, 1 MG/DOSE,) 4 MG/3ML SOPN Inject 1 mg into the skin once a week.   valsartan (DIOVAN) 40 MG tablet Take 1 tablet (40 mg total) by mouth daily.   Vibegron (GEMTESA) 75 MG TABS Take 75 mg by mouth daily.   Vitamin D, Ergocalciferol, (DRISDOL) 1.25 MG (50000 UNIT) CAPS capsule TAKE ONE CAPSULE BY MOUTH ONCE WEEKLY ON WEDNESDAY (Patient not taking: Reported on 01/24/2022)   No facility-administered encounter medications on file as of 02/16/2022.   Reviewed chart for medication changes ahead of medication coordination call.  No  hospital visits since last care coordination call/Pharmacist visit.   BP Readings from Last 3 Encounters:  01/24/22 130/80  01/24/22 130/80  09/26/21 (!) 142/98    Lab Results  Component Value Date   HGBA1C 9.8 (H) 01/24/2022     Patient obtains medications through Adherence Packaging  30  Days   Last adherence delivery included:  Tresiba 27 units daily Lyrica 50 mg 3 times daily 1 before breakfast, 1 with evening meal and 1 at bedtime. Women's 50 Plus Multivitamin 1 daily (breakfast) Kerendia 10 mg daily (breakfast)- in easy open vials Pravastatin 40 mg daily (bedtime) Amlodipine 5 mg daily (bedtime) Pantoprazole 40 mg daily (breakfast) Linzess 145 mcg daily (before breakfast)- in easy open vial Vitamin D2 1250 mcg weekly on Wednesdays  (breakfast) Insulin lispro 8 units 3 times daily, max daily dose 40 units  Patient declined (meds) last month: Ozempic- Patient wants to continue getting medication from [facility].  Patient is due for next adherence delivery on: 02-28-2022  Called patient and reviewed medications and coordinated delivery.  This delivery to include: Tresiba 27 units daily Lyrica 50 mg 3 times daily 1 before breakfast, 1 with evening meal and 1 at bedtime. Women's 50 Plus Multivitamin 1 daily (breakfast) Kerendia 10 mg daily (breakfast)- in easy open vials Pravastatin 40 mg daily (bedtime) Amlodipine 5 mg daily (bedtime) Pantoprazole 40 mg daily (breakfast) Linzess 145 mcg daily (before breakfast)- in easy open vial Vitamin D2 1250 mcg weekly on Wednesdays (breakfast) Insulin lispro 8 units 3 times daily, max daily dose 40 units Ozempic 0.5 mg weekly   Patient declined the following medications: None  Patient needs refills for: None  Confirmed delivery date of  advised patient that pharmacy will contact them the morning of delivery.  [place] Pharmacist Assistant [phone]

## 2022-02-20 ENCOUNTER — Ambulatory Visit: Payer: Medicare Other

## 2022-02-20 VITALS — BP 142/80 | HR 83 | Temp 98.3°F | Ht 61.6 in | Wt 165.0 lb

## 2022-02-20 DIAGNOSIS — I10 Essential (primary) hypertension: Secondary | ICD-10-CM

## 2022-02-20 NOTE — Progress Notes (Signed)
Patient presents today for a bp check. She was started on Valsartan 40mg  during her visit on 01/24/22. Pt admits she does not think she started taking the valsartan yet she is to go home and give [place] a call back and let us know if she is taking it. Patient's bp was 142/80.

## 2022-02-23 ENCOUNTER — Ambulatory Visit: Payer: Self-pay

## 2022-02-23 ENCOUNTER — Telehealth: Payer: Medicare Other

## 2022-02-23 DIAGNOSIS — Z9889 Other specified postprocedural states: Secondary | ICD-10-CM

## 2022-02-23 DIAGNOSIS — E782 Mixed hyperlipidemia: Secondary | ICD-10-CM

## 2022-02-23 DIAGNOSIS — E1122 Type 2 diabetes mellitus with diabetic chronic kidney disease: Secondary | ICD-10-CM

## 2022-02-23 DIAGNOSIS — I10 Essential (primary) hypertension: Secondary | ICD-10-CM

## 2022-02-23 NOTE — Chronic Care Management (AMB) (Signed)
  Care Management   Follow Up Note   02/23/2022 Name: [name] MRN: [id number] DOB: [birth year]   Referred by: [name], FNP Reason for referral : Chronic Care Management (RN CM Follow up call #3)   Third unsuccessful telephone outreach was attempted today. The patient was referred to the case management team for assistance with care management and care coordination. The patient's primary care provider has been notified of our unsuccessful attempts to make or maintain contact with the patient. The care management team is pleased to engage with this patient at any time in the future should he/she be interested in assistance from the care management team.   Follow Up Plan: We have been unable to make contact with the patient for follow up. The care management team is available to follow up with the patient after provider conversation with the patient regarding recommendation for care management engagement and subsequent re-referral to the care management team.   [name], RN, BSN, CCM Care Management Coordinator [place] [facility]  Direct Phone: [phone]

## 2022-02-26 ENCOUNTER — Telehealth: Payer: Self-pay

## 2022-02-26 NOTE — Chronic Care Management (AMB) (Signed)
    Called [name], No answer, left message of appointment on 02-28-2022 at 10:00 via telephone visit with [name], Pharm D. Notified to have all medications, supplements, blood pressure and/or blood sugar logs available during appointment and to return call if need to reschedule.   [place] Pharmacist Assistant [phone]

## 2022-02-28 ENCOUNTER — Telehealth: Payer: Medicare Other

## 2022-03-01 ENCOUNTER — Encounter: Payer: Self-pay | Admitting: Nurse Practitioner

## 2022-03-12 ENCOUNTER — Telehealth: Payer: Self-pay

## 2022-03-12 NOTE — Chronic Care Management (AMB) (Addendum)
03-12-2022: received message from [place] at [facility]: "there were 2 dose increases sent in for her pregabalin and amlodipine, the pregabalin was increased to '100mg'$  1 capsule 3 times daily and the amlodipine was increased to '10mg'$  daily. She currently has '50mg'$  pregabalin 3 times daily in packs and '5mg'$  amlodipine in packs. Would it be okay for [place] to send extra tablets and capsules of both for her to take in addition to packs to equal new dose until next packs are due?"   Left patient a VM to call back and clarify. Updated [place]. Received another message from [place]: "She called back into the pharmacy a little bit ago and talked to Amy. She has extra on hand of both meds and she is going to increase the dose from what she has on hand and she said she thinks she has enough to last until next packs start and we can start that in the next packs. If she doesnt have enough on hand she said she would call us! "  03-13-2022: message sent today from [employer]: "Pravastatin was just sent in for Renatta Shrieves DOB: 03-16-1951 but I thought this was to be discontinued since the patient is now on atorvastatin and the valsartan was added? Is the patient being switched back to this or?" Contacted Dr. Elinor Parkinson and was told by [facility] that a message will be sent to doctor's team to clarify and they will call me back today before 4. Tried contacting patient left VM. Updated [place]as[place].  [place] Pharmacist Assistant [phone]

## 2022-03-19 ENCOUNTER — Telehealth: Payer: Self-pay

## 2022-03-19 NOTE — Chronic Care Management (AMB) (Signed)
Chronic Care Management Pharmacy Assistant   Name: [name]  MRN: [id number] DOB: 06/23/1951   Reason for Encounter: Medication Review/ Medication coordination  Recent office visits:  02-23-2022 Little, Claudette Stapler, RN (CCM).  02-20-2022 Blanca Friend, CMA. BP check 142/80.  Recent consult visits:  None  Hospital visits:  None in previous 6 months  Medications: Outpatient Encounter Medications as of 03/19/2022  Medication Sig   amLODipine (NORVASC) 5 MG tablet Take 1 tablet (5 mg total) by mouth at bedtime.   aspirin EC 81 MG EC tablet Take 1 tablet (81 mg total) by mouth daily. Swallow whole.   atorvastatin (LIPITOR) 10 MG tablet Take 1 tablet (10 mg total) by mouth daily.   blood glucose meter kit and supplies KIT Dispense based on patient and insurance preference. Check blood sugars 4 times daily E11.69   Continuous Blood Gluc Receiver (FREESTYLE LIBRE READER) DEVI 1 each by Does not apply route See admin instructions. CGM device-Freestyle Libre   Continuous Blood Gluc Sensor (FREESTYLE LIBRE 14 DAY SENSOR) MISC 1 each by Does not apply route See admin instructions. CGM 14-day sensors; send refills to [employer] (in Epic)   Finerenone (KERENDIA) 10 MG TABS Take 1 tablet by mouth daily.   HUMALOG 100 UNIT/ML injection Inject 0.1 mLs (10 Units total) into the skin 3 (three) times daily with meals.   insulin degludec (TRESIBA FLEXTOUCH) 100 UNIT/ML FlexTouch Pen Inject 27 Units into the skin daily.   Insulin Pen Needle (BD PEN NEEDLE MICRO U/F) 32G X 6 MM MISC DX:E11.65 USE TO CHECK BLOOD SUGARS THREE TIMES A DAY   Lancets Misc. MISC 1 each by Does not apply route 4 (four) times daily -  with meals and at bedtime.   linaclotide (LINZESS) 145 MCG CAPS capsule Take 1 capsule (145 mcg total) by mouth daily before breakfast.   Multiple Vitamin (MULTIVITAMIN WITH MINERALS) TABS tablet Take 1 tablet by mouth daily.   Multiple Vitamin (MULTIVITAMIN) tablet Take 1  tablet by mouth daily.   nystatin cream (MYCOSTATIN) Apply to affected area 2 times daily   ONETOUCH VERIO test strip USE TO test blood sugar FOUR TIMES DAILY   pantoprazole (PROTONIX) 40 MG tablet TAKE ONE TABLET BY MOUTH ONCE DAILY   pregabalin (LYRICA) 50 MG capsule TAKE ONE CAPSULE BY MOUTH BEFORE BREAKFAST and TAKE ONE CAPSULE BY MOUTH EVERY EVENING and TAKE ONE CAPSULE BY MOUTH EVERYDAY AT BEDTIME   Semaglutide, 1 MG/DOSE, (OZEMPIC, 1 MG/DOSE,) 4 MG/3ML SOPN Inject 1 mg into the skin once a week.   valsartan (DIOVAN) 40 MG tablet Take 1 tablet (40 mg total) by mouth daily.   Vibegron (GEMTESA) 75 MG TABS Take 75 mg by mouth daily.   Vitamin D, Ergocalciferol, (DRISDOL) 1.25 MG (50000 UNIT) CAPS capsule TAKE ONE CAPSULE BY MOUTH ONCE WEEKLY ON WEDNESDAY (Patient not taking: Reported on 01/24/2022)   No facility-administered encounter medications on file as of 03/19/2022.  Reviewed chart for medication changes ahead of medication coordination call.  No hospital visits since last care coordination call/Pharmacist visit.   No medication changes indicated  BP Readings from Last 3 Encounters:  02/20/22 (!) 142/80  01/24/22 130/80  01/24/22 130/80    Lab Results  Component Value Date   HGBA1C 9.8 (H) 01/24/2022     Patient obtains medications through Adherence Packaging  30 Days   Last adherence delivery included:  Tresiba 27 units daily Lyrica 50 mg 3 times daily 1 before breakfast, 1  with evening meal and 1 at bedtime. Women's 50 Plus Multivitamin 1 daily (breakfast) Kerendia 10 mg daily (breakfast)- in easy open vials Pravastatin 40 mg daily (bedtime) Amlodipine 5 mg daily (bedtime) Pantoprazole 40 mg daily (breakfast) Linzess 145 mcg daily (before breakfast)- in easy open vial Vitamin D2 1250 mcg weekly on Wednesdays (breakfast) Insulin lispro 8 units 3 times daily, max daily dose 40 units Ozempic 0.5 mg weekly  Patient declined (meds) last month:  None  Patient is due  for next adherence delivery on: 03-29-2022  Called patient and reviewed medications and coordinated delivery.  This delivery to include: Tresiba 27 units daily Lyrica 50 mg 3 times daily 1 before breakfast, 1 with evening meal and 1 at bedtime. Women's 50 Plus Multivitamin 1 daily (breakfast) Kerendia 10 mg daily (breakfast)- in easy open vials Valsartan 40 mg (Breakfast) Amlodipine 5 mg daily (bedtime) Pantoprazole 40 mg daily (breakfast) Linzess 145 mcg daily (before breakfast)- in easy open vial Vitamin D2 1250 mcg weekly on Wednesdays (breakfast) Insulin lispro 8 units 3 times daily, max daily dose 40 units Ozempic 0.5 mg weekly Atorvastatin 10 mg (Bedtime)  Patient declined the following medications: Unable to reach patient  Patient needs refills for: None  Unable to reach patient to confirmed delivery date of 03-29-2022.  03-19-2022: 1st attempt left VM 03-20-2022: 2nd attempt left VM 03-21-2022: 3rd attempt left VM  Care Gaps: Covid booster overdue  Star Rating Drugs: Ozempic 0.5 mg- Last filled 02-28-2022 28 DS upstream Atorvastatin 10 mg- Last filled 02-22-2022 30 DS upstream Valsartan 40 mg- Last filled 02-22-2022 30 DS upstream  [place] Pharmacist Assistant [phone]

## 2022-03-28 ENCOUNTER — Encounter (HOSPITAL_BASED_OUTPATIENT_CLINIC_OR_DEPARTMENT_OTHER): Payer: Self-pay

## 2022-03-28 ENCOUNTER — Emergency Department (HOSPITAL_BASED_OUTPATIENT_CLINIC_OR_DEPARTMENT_OTHER)
Admission: EM | Admit: 2022-03-28 | Discharge: 2022-03-28 | Disposition: A | Discharge status: Home / Self Care | Payer: Medicare Other | Attending: Emergency Medicine | Admitting: Emergency Medicine

## 2022-03-28 ENCOUNTER — Emergency Department (HOSPITAL_BASED_OUTPATIENT_CLINIC_OR_DEPARTMENT_OTHER): Payer: Medicare Other

## 2022-03-28 ENCOUNTER — Other Ambulatory Visit (HOSPITAL_BASED_OUTPATIENT_CLINIC_OR_DEPARTMENT_OTHER): Payer: Self-pay

## 2022-03-28 ENCOUNTER — Other Ambulatory Visit: Payer: Self-pay

## 2022-03-28 DIAGNOSIS — J45909 Unspecified asthma, uncomplicated: Secondary | ICD-10-CM | POA: Diagnosis not present

## 2022-03-28 DIAGNOSIS — R630 Anorexia: Secondary | ICD-10-CM | POA: Insufficient documentation

## 2022-03-28 DIAGNOSIS — U071 COVID-19: Secondary | ICD-10-CM

## 2022-03-28 DIAGNOSIS — E119 Type 2 diabetes mellitus without complications: Secondary | ICD-10-CM | POA: Diagnosis not present

## 2022-03-28 DIAGNOSIS — I10 Essential (primary) hypertension: Secondary | ICD-10-CM | POA: Diagnosis not present

## 2022-03-28 DIAGNOSIS — R059 Cough, unspecified: Secondary | ICD-10-CM | POA: Diagnosis present

## 2022-03-28 DIAGNOSIS — R5383 Other fatigue: Secondary | ICD-10-CM | POA: Insufficient documentation

## 2022-03-28 LAB — CBC WITH DIFFERENTIAL/PLATELET
Abs Immature Granulocytes: 0.01 10*3/uL (ref 0.00–0.07)
Basophils Absolute: 0 10*3/uL (ref 0.0–0.1)
Basophils Relative: 0 %
Eosinophils Absolute: 0 10*3/uL (ref 0.0–0.5)
Eosinophils Relative: 0 %
HCT: 43.5 % (ref 36.0–46.0)
Hemoglobin: 14.1 g/dL (ref 12.0–15.0)
Immature Granulocytes: 0 %
Lymphocytes Relative: 14 %
Lymphs Abs: 0.8 10*3/uL (ref 0.7–4.0)
MCH: 29.1 pg (ref 26.0–34.0)
MCHC: 32.4 g/dL (ref 30.0–36.0)
MCV: 89.7 fL (ref 80.0–100.0)
Monocytes Absolute: 0.6 10*3/uL (ref 0.1–1.0)
Monocytes Relative: 11 %
Neutro Abs: 4.1 10*3/uL (ref 1.7–7.7)
Neutrophils Relative %: 75 %
Platelets: 199 10*3/uL (ref 150–400)
RBC: 4.85 MIL/uL (ref 3.87–5.11)
RDW: 12.8 % (ref 11.5–15.5)
WBC: 5.6 10*3/uL (ref 4.0–10.5)
nRBC: 0 % (ref 0.0–0.2)

## 2022-03-28 LAB — COMPREHENSIVE METABOLIC PANEL
ALT: 15 U/L (ref 0–44)
AST: 35 U/L (ref 15–41)
Albumin: 4 g/dL (ref 3.5–5.0)
Alkaline Phosphatase: 108 U/L (ref 38–126)
Anion gap: 13 (ref 5–15)
BUN: 15 mg/dL (ref 8–23)
CO2: 29 mmol/L (ref 22–32)
Calcium: 9.7 mg/dL (ref 8.9–10.3)
Chloride: 96 mmol/L — ABNORMAL LOW (ref 98–111)
Creatinine, Ser: 1.27 mg/dL — ABNORMAL HIGH (ref 0.44–1.00)
GFR, Estimated: 46 mL/min — ABNORMAL LOW (ref >60)
Glucose, Bld: 213 mg/dL — ABNORMAL HIGH (ref 70–99)
Potassium: 3.6 mmol/L (ref 3.5–5.1)
Sodium: 138 mmol/L (ref 135–145)
Total Bilirubin: 0.8 mg/dL (ref 0.3–1.2)
Total Protein: 7.2 g/dL (ref 6.5–8.1)

## 2022-03-28 LAB — RESP PANEL BY RT-PCR (FLU A&B, COVID) ARPGX2
Influenza A by PCR: NEGATIVE
Influenza B by PCR: NEGATIVE
SARS Coronavirus 2 by RT PCR: POSITIVE — AB

## 2022-03-28 LAB — TROPONIN I (HIGH SENSITIVITY): Troponin I (High Sensitivity): 10 ng/L (ref <18)

## 2022-03-28 LAB — LIPASE, BLOOD: Lipase: 21 U/L (ref 11–51)

## 2022-03-28 MED ORDER — ONDANSETRON 4 MG PO TBDP
4.0000 mg | ORAL_TABLET | Freq: Three times a day (TID) | ORAL | 0 refills | Status: DC | PRN
  Filled 2022-03-28: qty 20, 7d supply, fill #0

## 2022-03-28 MED ORDER — NIRMATRELVIR/RITONAVIR (PAXLOVID) TABLET (RENAL DOSING)
2.0000 | ORAL_TABLET | Freq: Two times a day (BID) | ORAL | 0 refills | Status: AC
  Filled 2022-03-28: qty 20, 5d supply, fill #0

## 2022-03-28 MED ORDER — ONDANSETRON HCL 4 MG/2ML IJ SOLN: 4.0000 mg | Freq: Once | INTRAMUSCULAR | Status: DC

## 2022-03-28 MED ORDER — SODIUM CHLORIDE 0.9 % IV BOLUS
500.0000 mL | Freq: Once | INTRAVENOUS | Status: AC
  Administered 2022-03-28: 500 mL via INTRAVENOUS

## 2022-03-28 NOTE — ED Triage Notes (Signed)
Pt presents to ED POV from home with vomiting and feeling since Saturday. Pt's partner grandson and daughter both tested positive for Covid. Pt was around both of them. Partner denies any symptoms

## 2022-03-28 NOTE — ED Provider Notes (Signed)
Emergency Department Provider Note   I have reviewed the triage vital signs and the nursing notes.   HISTORY  Chief Complaint Weakness and Emesis   HPI [name] is a 71 y.o. female past history of diabetes, GERD, fibromyalgia, hypertension presents to the emergency department with generalized weakness, poor appetite, vomiting, cough, scratchy throat.  She has had recent exposure to COVID-19 and symptoms have been ongoing now for 4 days.  She describes chest discomfort only with coughing.  She does have history of esophageal stricture requiring dilation but denies any food feeling stuck in her throat currently.  She is swallowing saliva without difficulty.  Denies significant abdominal pain.    Past Medical History:  Diagnosis Date   Aortic stenosis    mild on 04/07/19 echo   Arthritis    L knee, hands, back    Asthma    Diabetes mellitus type 2, insulin dependent (HCC)    Fibromyalgia    Full dentures    Full dentures    GERD (gastroesophageal reflux disease)    H/O hiatal hernia    Headache    h/o migraines - was followed for a time with a wellness doctor   History of esophageal dilatation    History of rhabdomyolysis    03/ 2015   Hyperlipidemia    Hypertension    Insulin pump in place    since 12/ 2014--  [employer]   Lumbar disc herniation    right leg weakness   Osteoarthritis of left knee, primary localized 08/17/2014   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   Renal insufficiency    Rhabdomyolysis 11/15/2013   SUI (stress urinary incontinence, female)    Syncope 12/27/2017   Wears glasses    Wears glasses     Review of Systems  Constitutional: No fever/chills. Positive fatigue.  Eyes: No visual changes. ENT: Positive sore throat. Cardiovascular: Positive chest pain with cough.  Respiratory: Denies shortness of breath. Gastrointestinal: No abdominal pain. Positive nausea and vomiting.  No diarrhea.  No constipation. Genitourinary: Negative for  dysuria. Musculoskeletal: Positive joint pain and body aches.  Skin: Negative for rash. Neurological: Negative for focal weakness or numbness. Positive HA.   ____________________________________________   PHYSICAL EXAM:  VITAL SIGNS: ED Triage Vitals  Enc Vitals Group     BP 03/28/22 1326 (!) 171/99     Pulse Rate 03/28/22 1326 96     Resp 03/28/22 1326 20     Temp 03/28/22 1326 98.7 F (37.1 C)     Temp src --      SpO2 03/28/22 1326 100 %   Constitutional: Alert and oriented. Well appearing and in no acute distress. Eyes: Conjunctivae are normal.  Head: Atraumatic. Nose: No congestion/rhinnorhea. Mouth/Throat: Mucous membranes are moist.  Oropharynx non-erythematous. No PTA. Clear voice. Managing oral secretions.  Neck: No stridor.  Cardiovascular: Normal rate, regular rhythm. Good peripheral circulation. Grossly normal heart sounds.   Respiratory: Normal respiratory effort.  No retractions. Lungs CTAB. Gastrointestinal: Soft and nontender. No distention.  Musculoskeletal: No lower extremity tenderness nor edema. No gross deformities of extremities. Neurologic:  Normal speech and language. No gross focal neurologic deficits are appreciated.  Skin:  Skin is warm, dry and intact. No rash noted.  ____________________________________________   LABS (all labs ordered are listed, but only abnormal results are displayed)  Labs Reviewed  RESP PANEL BY RT-PCR (FLU A&B, COVID) ARPGX2 - Abnormal; Notable for the following components:      Result Value   SARS  Coronavirus 2 by RT PCR POSITIVE (*)    All other components within normal limits  COMPREHENSIVE METABOLIC PANEL - Abnormal; Notable for the following components:   Chloride 96 (*)    Glucose, Bld 213 (*)    Creatinine, Ser 1.27 (*)    GFR, Estimated 46 (*)    All other components within normal limits  LIPASE, BLOOD  CBC WITH DIFFERENTIAL/PLATELET  URINALYSIS, ROUTINE W REFLEX MICROSCOPIC  TROPONIN I (HIGH SENSITIVITY)   TROPONIN I (HIGH SENSITIVITY)   ____________________________________________  EKG   EKG Interpretation  Date/Time:  Wednesday March 28 2022 14:26:07 EDT Ventricular Rate:  83 PR Interval:  160 QRS Duration: 73 QT Interval:  387 QTC Calculation: 455 R Axis:   64 Text Interpretation: Sinus rhythm Anteroseptal infarct, old Confirmed by Nanda Quinton [phone]) on 03/28/2022 2:49:34 PM        ____________________________________________  RADIOLOGY  DG Chest Portable 1 View  Result Date: 03/28/2022 CLINICAL DATA:  Short of breath and cough EXAM: PORTABLE CHEST 1 VIEW COMPARISON:  For 12/2020 FINDINGS: The heart size and mediastinal contours are within normal limits. Both lungs are clear. The visualized skeletal structures are unremarkable. IMPRESSION: No active disease. Electronically Signed   By: Franchot Gallo M.D.   On: 03/28/2022 14:12    ____________________________________________   PROCEDURES  Procedure(s) performed:   Procedures  None ____________________________________________   INITIAL IMPRESSION / ASSESSMENT AND PLAN / ED COURSE  Pertinent labs & imaging results that were available during my care of the patient were reviewed by me and considered in my medical decision making (see chart for details).   This patient is Presenting for Evaluation of cough/flu-like symptoms, which does require a range of treatment options, and is a complaint that involves a high risk of morbidity and mortality.  The Differential Diagnoses include COVID, CAP, Flu, CHF, AKI, electrolyte disturbance, developing sepsis, etc.  Critical Interventions-    Medications  ondansetron (ZOFRAN) injection 4 mg (has no administration in time range)  sodium chloride 0.9 % bolus 500 mL (500 mLs Intravenous New Bag/Given 03/28/22 1419)    Reassessment after intervention: Symptoms improved and patient tolerated PO in the ED.    Clinical Laboratory Tests Ordered, included COVID-positive on PCR.   Flu negative.  Troponin normal.  Mild renal insufficiency similar to prior.  Hyperglycemia without DKA.  Lipase normal.  Radiologic Tests Ordered, included CXR. I independently interpreted the images and agree with radiology interpretation.   Cardiac Monitor Tracing which shows NSR.   Social Determinants of Health Risk patient is a non-smoker.   Medical Decision Making: Summary:  Patient presents emergency department with COVID-19 symptoms after exposure.  No hypoxemia or increased work of breathing.  Appears mildly dehydrated.  Plan for IV fluids, labs, COVID swab, chest x-ray and reassess.  Abdomen is soft and nontender.  Do not plan for CT imaging of the abdomen.  Reevaluation with update and discussion with patient.  Her COVID test does come back positive.  She is just inside the window to benefit from Paxlovid plan to renally dose her for this.  She is in agreement with starting this medication after discussion.  We will also provide Zofran as needed for nausea.  Considered admission but patient has not hypoxemic and overall looking well.  Appears stable for discharge with strict ED return precautions and antiviral medication.  Disposition: discharge  ____________________________________________  FINAL CLINICAL IMPRESSION(S) / ED DIAGNOSES  Final diagnoses:  COVID-19     NEW OUTPATIENT MEDICATIONS STARTED  DURING THIS VISIT:  New Prescriptions   NIRMATRELVIR/RITONAVIR EUA, RENAL DOSING, (PAXLOVID) 10 X 150 MG & 10 X '100MG'$  TABS    Take 2 tablets by mouth 2 (two) times daily for 5 days. Patient GFR is 46. Take nirmatrelvir (150 mg) one tablet twice daily for 5 days and ritonavir (100 mg) one tablet twice daily for 5 days.   ONDANSETRON (ZOFRAN-ODT) 4 MG DISINTEGRATING TABLET    Take 1 tablet (4 mg total) by mouth every 8 (eight) hours as needed.    Note:  This document was prepared using Dragon voice recognition software and may include unintentional dictation errors.  Nanda Quinton, MD, [facility]    Deliliah Spranger, [facility], MD 03/28/22 [phone]

## 2022-03-28 NOTE — Discharge Instructions (Signed)
You are seen in the emergency room today with fatigue.  You were diagnosed with COVID-19 and we are starting you on antiviral medications.  I have also called in some nausea medications.  Please drink plenty of fluids and get rest.  He may take Tylenol for pain or fever.  If you develop trouble breathing, chest pain, sudden worsening symptoms please return for reevaluation.

## 2022-03-30 ENCOUNTER — Telehealth: Payer: Self-pay

## 2022-03-30 NOTE — Telephone Encounter (Signed)
Transition Care Management Unsuccessful Follow-up Telephone Call  Date of discharge and from where:  03/28/2022  [facility]  Attempts:  1st Attempt  Reason for unsuccessful TCM follow-up call:  Left voice message

## 2022-04-03 ENCOUNTER — Encounter: Payer: Self-pay | Admitting: Nurse Practitioner

## 2022-04-03 DIAGNOSIS — E113399 Type 2 diabetes mellitus with moderate nonproliferative diabetic retinopathy without macular edema, unspecified eye: Secondary | ICD-10-CM | POA: Insufficient documentation

## 2022-04-03 HISTORY — DX: Type 2 diabetes mellitus with moderate nonproliferative diabetic retinopathy without macular edema, unspecified eye: E11.3399

## 2022-04-18 ENCOUNTER — Telehealth: Payer: Self-pay

## 2022-04-18 DIAGNOSIS — I10 Essential (primary) hypertension: Secondary | ICD-10-CM

## 2022-04-18 MED ORDER — VALSARTAN 40 MG PO TABS: 40.0000 mg | ORAL_TABLET | Freq: Every day | ORAL | 2 refills | Status: DC

## 2022-04-18 NOTE — Telephone Encounter (Signed)
  Patient needs refills of Valsartan 40 mg tablet once daily. Called patient to confirm she was taking this medication daily. Patient did not answer, I left a VM for patient to call back if there are any questions or concerns.     [name], CPP, PharmD Clinical Pharmacist Practitioner [facility] [phone]

## 2022-04-18 NOTE — Chronic Care Management (AMB) (Signed)
Chronic Care Management Pharmacy Assistant   Name: [name]  MRN: [id number] DOB: Sep 04, 1950   Reason for Encounter: Medication Review/ Medication coordination  Recent office visits:  02-23-2022 Little, Claudette Stapler, RN (CCM).  02-20-2022 Blanca Friend, CMA. Visit for BP check 142/80.  Recent consult visits:  None  Hospital visits:  Medication Reconciliation was completed by comparing discharge summary, patient's EMR and Pharmacy list, and upon discussion with patient.  Admitted to the hospital on 03-28-2022 due to covid. Discharge date was 03-28-2022. Discharged from [place]?Medications Started at [facility] Discharge:?? nirmatrelvir/ritonavir EUA Take 2 tablets by mouth 2 (two) times daily for 5 days. Patient GFR is 46.  Take nirmatrelvir (150 mg) one tablet twice daily for 5 days and ritonavir (100 mg) one tablet twice daily for 5 days Zofran 4 mg every 8 hours PRN  Medication Changes at Hospital Discharge: None  Medications Discontinued at Hospital Discharge: None  Medications that remain the same after Hospital Discharge:??  -All other medications will remain the same.    Medications: Outpatient Encounter Medications as of 04/18/2022  Medication Sig   amLODipine (NORVASC) 5 MG tablet Take 1 tablet (5 mg total) by mouth at bedtime.   aspirin EC 81 MG EC tablet Take 1 tablet (81 mg total) by mouth daily. Swallow whole.   atorvastatin (LIPITOR) 10 MG tablet Take 1 tablet (10 mg total) by mouth daily.   blood glucose meter kit and supplies KIT Dispense based on patient and insurance preference. Check blood sugars 4 times daily E11.69   Continuous Blood Gluc Receiver (FREESTYLE LIBRE READER) DEVI 1 each by Does not apply route See admin instructions. CGM device-Freestyle Libre   Continuous Blood Gluc Sensor (FREESTYLE LIBRE 14 DAY SENSOR) MISC 1 each by Does not apply route See admin instructions. CGM 14-day sensors; send  refills to [employer] (in Epic)   Finerenone (KERENDIA) 10 MG TABS Take 1 tablet by mouth daily.   HUMALOG 100 UNIT/ML injection Inject 0.1 mLs (10 Units total) into the skin 3 (three) times daily with meals.   insulin degludec (TRESIBA FLEXTOUCH) 100 UNIT/ML FlexTouch Pen Inject 27 Units into the skin daily.   Insulin Pen Needle (BD PEN NEEDLE MICRO U/F) 32G X 6 MM MISC DX:E11.65 USE TO CHECK BLOOD SUGARS THREE TIMES A DAY   Lancets Misc. MISC 1 each by Does not apply route 4 (four) times daily -  with meals and at bedtime.   linaclotide (LINZESS) 145 MCG CAPS capsule Take 1 capsule (145 mcg total) by mouth daily before breakfast.   Multiple Vitamin (MULTIVITAMIN WITH MINERALS) TABS tablet Take 1 tablet by mouth daily.   Multiple Vitamin (MULTIVITAMIN) tablet Take 1 tablet by mouth daily.   nystatin cream (MYCOSTATIN) Apply to affected area 2 times daily   ondansetron (ZOFRAN-ODT) 4 MG disintegrating tablet Take 1 tablet (4 mg total) by mouth every 8 (eight) hours as needed.   ONETOUCH VERIO test strip USE TO test blood sugar FOUR TIMES DAILY   pantoprazole (PROTONIX) 40 MG tablet TAKE ONE TABLET BY MOUTH ONCE DAILY   pregabalin (LYRICA) 50 MG capsule TAKE ONE CAPSULE BY MOUTH BEFORE BREAKFAST and TAKE ONE CAPSULE BY MOUTH EVERY EVENING and TAKE ONE CAPSULE BY MOUTH EVERYDAY AT BEDTIME   valsartan (DIOVAN) 40 MG tablet Take 1 tablet (40 mg total) by mouth daily.   Vibegron (GEMTESA) 75 MG TABS Take 75 mg by mouth daily.   Vitamin D, Ergocalciferol, (DRISDOL)  1.25 MG (50000 UNIT) CAPS capsule TAKE ONE CAPSULE BY MOUTH ONCE WEEKLY ON WEDNESDAY (Patient not taking: Reported on 01/24/2022)   No facility-administered encounter medications on file as of 04/18/2022.  Reviewed chart for medication changes ahead of medication coordination call.  No Consults since last care coordination call/Pharmacist visit.   No medication changes indicated   BP Readings from Last 3 Encounters:  03/28/22 (!)  164/79  02/20/22 (!) 142/80  01/24/22 130/80    Lab Results  Component Value Date   HGBA1C 9.8 (H) 01/24/2022     Patient obtains medications through Adherence Packaging  30 Days   Last adherence delivery included:  Tresiba 27 units daily Lyrica 50 mg 3 times daily 1 before breakfast, 1 with evening meal and 1 at bedtime. Women's 50 Plus Multivitamin 1 daily (breakfast) Kerendia 10 mg daily (breakfast)- in easy open vials Valsartan 40 mg (Breakfast) Amlodipine 5 mg daily (bedtime) Pantoprazole 40 mg daily (breakfast) Linzess 145 mcg daily (before breakfast)- in easy open vial Vitamin D2 1250 mcg weekly on Wednesdays (breakfast) Insulin lispro 8 units 3 times daily, max daily dose 40 units Ozempic 0.5 mg weekly Atorvastatin 10 mg (Bedtime)  Patient declined (meds) last month: Unable to reach patient  Patient is due for next adherence delivery on: 04-30-2022  Called patient and reviewed medications and coordinated delivery.  This delivery to include: Tresiba 27 units daily Lyrica 50 mg 3 times daily 1 before breakfast, 1 with evening meal and 1 at bedtime. Women's 50 Plus Multivitamin 1 daily (breakfast) Kerendia 10 mg daily (breakfast)- in easy open vials Valsartan 40 mg (Breakfast) Amlodipine 5 mg daily (bedtime) Pantoprazole 40 mg daily (breakfast) Linzess 145 mcg daily (before breakfast)- in easy open vial Vitamin D2 1250 mcg weekly on Wednesdays (breakfast) Insulin lispro 8 units 3 times daily, max daily dose 40 units Ozempic 0.5 mg weekly Atorvastatin 10 mg (Bedtime)  No acute/short fill needed  Patient declined the following medications: None  Patient needs refills for: Sent to PCP Valsartan  Confirmed delivery date of 04-30-2022 advised patient that pharmacy will contact them the morning of delivery.  Care Gaps: Covid booster overdue Flu vaccine overdue  Star Rating Drugs: Ozempic 0.5 mg- Last filled 03-22-2022 28 DS upstream Atorvastatin 10 mg-  Last filled 03-22-2022 30 DS upstream Valsartan 40 mg- Last filled 03-22-2022 30 DS upstream  [place] Pharmacist Assistant [phone]

## 2022-05-01 ENCOUNTER — Encounter: Payer: Self-pay | Admitting: *Deleted

## 2022-05-01 NOTE — Progress Notes (Signed)
[facility] Quality Team Note  Name: [name] Date of Birth: 1951/06/07 MRN: [id number] Date: 05/01/2022  [facility] Quality Team has reviewed this patient's chart, please see recommendations below:  [facility] Quality Other; (Pt has gap for A1C.  Last one out of range. Has appt 05/28/2022.  If ordered and in range, gap could be closed.)

## 2022-05-11 ENCOUNTER — Telehealth: Payer: Self-pay

## 2022-05-11 NOTE — Chronic Care Management (AMB) (Signed)
Chronic Care Management Pharmacy Assistant   Name: [name]  MRN: [id number] DOB: 17-Jan-1951  Reason for Encounter: Disease State/ General   Recent office visits:  None  Recent consult visits:  None  Hospital visits:  Medication Reconciliation was completed by comparing discharge summary, patient's EMR and Pharmacy list, and upon discussion with patient.   Admitted to the hospital on 03-28-2022 due to covid. Discharge date was 03-28-2022. Discharged from [facility]?Medications Started at [facility] Discharge:?? nirmatrelvir/ritonavir EUA Take 2 tablets by mouth 2 (two) times daily for 5 days. Patient GFR is 46.  Take nirmatrelvir (150 mg) one tablet twice daily for 5 days and ritonavir (100 mg) one tablet twice daily for 5 days Zofran 4 mg every 8 hours PRN   Medication Changes at Hospital Discharge: None   Medications Discontinued at Hospital Discharge: None   Medications that remain the same after Hospital Discharge:??  -All other medications will remain the same.    Medications: Outpatient Encounter Medications as of 05/11/2022  Medication Sig   amLODipine (NORVASC) 5 MG tablet Take 1 tablet (5 mg total) by mouth at bedtime.   aspirin EC 81 MG EC tablet Take 1 tablet (81 mg total) by mouth daily. Swallow whole.   atorvastatin (LIPITOR) 10 MG tablet Take 1 tablet (10 mg total) by mouth daily.   blood glucose meter kit and supplies KIT Dispense based on patient and insurance preference. Check blood sugars 4 times daily E11.69   Continuous Blood Gluc Receiver (FREESTYLE LIBRE READER) DEVI 1 each by Does not apply route See admin instructions. CGM device-Freestyle Libre   Continuous Blood Gluc Sensor (FREESTYLE LIBRE 14 DAY SENSOR) MISC 1 each by Does not apply route See admin instructions. CGM 14-day sensors; send refills to [employer] [facility] (in [employer])   Finerenone (KERENDIA) 10 MG TABS Take 1 tablet by mouth daily.   HUMALOG 100  UNIT/ML injection Inject 0.1 mLs (10 Units total) into the skin 3 (three) times daily with meals.   insulin degludec (TRESIBA FLEXTOUCH) 100 UNIT/ML FlexTouch Pen Inject 27 Units into the skin daily.   Insulin Pen Needle (BD PEN NEEDLE MICRO U/F) 32G X 6 MM MISC DX:E11.65 USE TO CHECK BLOOD SUGARS THREE TIMES A DAY   Lancets Misc. MISC 1 each by Does not apply route 4 (four) times daily -  with meals and at bedtime.   linaclotide (LINZESS) 145 MCG CAPS capsule Take 1 capsule (145 mcg total) by mouth daily before breakfast.   Multiple Vitamin (MULTIVITAMIN WITH MINERALS) TABS tablet Take 1 tablet by mouth daily.   Multiple Vitamin (MULTIVITAMIN) tablet Take 1 tablet by mouth daily.   nystatin cream (MYCOSTATIN) Apply to affected area 2 times daily   ondansetron (ZOFRAN-ODT) 4 MG disintegrating tablet Take 1 tablet (4 mg total) by mouth every 8 (eight) hours as needed.   ONETOUCH VERIO test strip USE TO test blood sugar FOUR TIMES DAILY   pantoprazole (PROTONIX) 40 MG tablet TAKE ONE TABLET BY MOUTH ONCE DAILY   pregabalin (LYRICA) 50 MG capsule TAKE ONE CAPSULE BY MOUTH BEFORE BREAKFAST and TAKE ONE CAPSULE BY MOUTH EVERY EVENING and TAKE ONE CAPSULE BY MOUTH EVERYDAY AT BEDTIME   valsartan (DIOVAN) 40 MG tablet Take 1 tablet (40 mg total) by mouth daily.   Vibegron (GEMTESA) 75 MG TABS Take 75 mg by mouth daily.   Vitamin D, Ergocalciferol, (DRISDOL) 1.25 MG (50000 UNIT) CAPS capsule TAKE ONE CAPSULE BY MOUTH ONCE  WEEKLY ON [facility] (Patient not taking: Reported on 01/24/2022)   No facility-administered encounter medications on file as of 05/11/2022.  Contacted [name] for General Review Call   Chart Review:  Have there been any documented new, changed, or discontinued medications since last visit? No  Has there been any documented recent hospitalizations or ED visits since last visit with Clinical Pharmacist? Yes Brief Summary (including medication and/or Diagnosis changes):  Review chart prep above.   Adherence Review:  Does the Clinical Pharmacist Assistant have access to adherence rates? Yes Adherence rates for STAR metric medications (Review star rating below). Adherence rates for medications indicated for disease state being reviewed (List medication(s)/day supply/ last 2 fill dates). Does the patient have >5 day gap between last estimated fill dates for any of the above medications or other medication gaps? No Reason for medication gaps: None   Disease State Questions:  Able to connect with Patient? Yes Did patient have any problems with their health recently? No  Have you had any admissions or emergency room visits or worsening of your condition(s) since last visit? No Details of ED visit, hospital visit and/or worsening condition(s): 03-28-2022 hospital for covid. Patient stated she is doing better.  Have you had any visits with new specialists or providers since your last visit? No  Have you had any new health care problem(s) since your last visit? No  Have you run out of any of your medications since you last spoke with clinical pharmacist? No  Are there any medications you are not taking as prescribed? No  Are you having any issues or side effects with your medications? No  Do you have any other health concerns or questions you want to discuss with your Clinical Pharmacist before your next visit? No  Are there any health concerns that you feel we can do a better job addressing? No  Are you having any problems with any of the following since the last visit: (select all that apply)  None  12. Any falls since last visit? No   13. Any increased or uncontrolled pain since last visit? No  15. Additional Details? No   NOTES: After completing assessment patient stated she is no longer a patient at [facility]. She transferred to [name] at [facility]. Patient would like to continue getting medications from [facility]. Updated team and  unerolled patient.   Care Gaps: Covid booster overdue Flu vaccine overdue  Star Rating Drugs: Valsartan 40 mg- Last filled 04-25-2022 30 [facility]. Previous filled 03-22-2022 30 [facility] Atorvastatin 10 mg- Last filled 04-25-2022 30 [facility]. Previous filled 03-22-2022 30 [facility] Ozempic 0.5 mg- Last filled 04-25-2022 28 [facility]. Previous filled 03-22-2022 28 [facility]  [place] Pharmacist Assistant [phone]

## 2022-05-28 ENCOUNTER — Ambulatory Visit: Payer: Medicare Other | Admitting: Nurse Practitioner

## 2022-06-06 ENCOUNTER — Other Ambulatory Visit: Payer: Self-pay | Admitting: Family Medicine

## 2022-06-07 NOTE — Telephone Encounter (Signed)
Unable to refill per protocol, last refill by another provider. Provider not at this practice. Will refuse.  Requested Prescriptions  Pending Prescriptions Disp Refills  . nystatin cream (MYCOSTATIN) [Pharmacy Med Name: NYSTATIN CREAM 15GM] 75 g     Sig: APPLY TOPICALLY TO THE AFFECTED AREA TWICE DAILY     There is no refill protocol information for this order

## 2022-06-15 ENCOUNTER — Other Ambulatory Visit: Payer: Self-pay | Admitting: Nurse Practitioner

## 2022-06-17 ENCOUNTER — Other Ambulatory Visit: Payer: Self-pay | Admitting: Nurse Practitioner

## 2022-07-12 ENCOUNTER — Other Ambulatory Visit: Payer: Self-pay | Admitting: Nurse Practitioner

## 2022-07-12 DIAGNOSIS — I10 Essential (primary) hypertension: Secondary | ICD-10-CM

## 2022-07-20 ENCOUNTER — Other Ambulatory Visit: Payer: Self-pay | Admitting: Nurse Practitioner

## 2022-07-29 LAB — COLOGUARD: COLOGUARD: NEGATIVE

## 2022-10-11 ENCOUNTER — Other Ambulatory Visit: Payer: Self-pay | Admitting: Pulmonary Disease

## 2022-10-11 DIAGNOSIS — Z1231 Encounter for screening mammogram for malignant neoplasm of breast: Secondary | ICD-10-CM

## 2022-10-13 ENCOUNTER — Other Ambulatory Visit: Payer: Self-pay

## 2022-10-13 ENCOUNTER — Emergency Department (HOSPITAL_BASED_OUTPATIENT_CLINIC_OR_DEPARTMENT_OTHER)
Admission: EM | Admit: 2022-10-13 | Discharge: 2022-10-14 | Disposition: A | Discharge status: Home / Self Care | Payer: 59 | Attending: Emergency Medicine | Admitting: Emergency Medicine

## 2022-10-13 ENCOUNTER — Encounter (HOSPITAL_BASED_OUTPATIENT_CLINIC_OR_DEPARTMENT_OTHER): Payer: Self-pay | Admitting: Emergency Medicine

## 2022-10-13 ENCOUNTER — Emergency Department (HOSPITAL_BASED_OUTPATIENT_CLINIC_OR_DEPARTMENT_OTHER): Payer: 59

## 2022-10-13 DIAGNOSIS — G510 Bell's palsy: Secondary | ICD-10-CM | POA: Diagnosis not present

## 2022-10-13 DIAGNOSIS — E1165 Type 2 diabetes mellitus with hyperglycemia: Secondary | ICD-10-CM | POA: Insufficient documentation

## 2022-10-13 DIAGNOSIS — Z794 Long term (current) use of insulin: Secondary | ICD-10-CM | POA: Insufficient documentation

## 2022-10-13 DIAGNOSIS — Z79899 Other long term (current) drug therapy: Secondary | ICD-10-CM | POA: Insufficient documentation

## 2022-10-13 DIAGNOSIS — Z7982 Long term (current) use of aspirin: Secondary | ICD-10-CM | POA: Insufficient documentation

## 2022-10-13 DIAGNOSIS — I10 Essential (primary) hypertension: Secondary | ICD-10-CM | POA: Diagnosis not present

## 2022-10-13 DIAGNOSIS — R29810 Facial weakness: Secondary | ICD-10-CM | POA: Diagnosis present

## 2022-10-13 DIAGNOSIS — R739 Hyperglycemia, unspecified: Secondary | ICD-10-CM

## 2022-10-13 LAB — BASIC METABOLIC PANEL
Anion gap: 10 (ref 5–15)
BUN: 13 mg/dL (ref 8–23)
CO2: 29 mmol/L (ref 22–32)
Calcium: 9.4 mg/dL (ref 8.9–10.3)
Chloride: 96 mmol/L — ABNORMAL LOW (ref 98–111)
Creatinine, Ser: 1.26 mg/dL — ABNORMAL HIGH (ref 0.44–1.00)
GFR, Estimated: 46 mL/min — ABNORMAL LOW (ref >60)
Glucose, Bld: 501 mg/dL (ref 70–99)
Potassium: 3.8 mmol/L (ref 3.5–5.1)
Sodium: 135 mmol/L (ref 135–145)

## 2022-10-13 LAB — CBC
HCT: 43.5 % (ref 36.0–46.0)
Hemoglobin: 14.2 g/dL (ref 12.0–15.0)
MCH: 29.8 pg (ref 26.0–34.0)
MCHC: 32.6 g/dL (ref 30.0–36.0)
MCV: 91.2 fL (ref 80.0–100.0)
Platelets: 258 10*3/uL (ref 150–400)
RBC: 4.77 MIL/uL (ref 3.87–5.11)
RDW: 12.2 % (ref 11.5–15.5)
WBC: 5.6 10*3/uL (ref 4.0–10.5)
nRBC: 0 % (ref 0.0–0.2)

## 2022-10-13 LAB — CBG MONITORING, ED
Glucose-Capillary: 404 mg/dL — ABNORMAL HIGH (ref 70–99)
Glucose-Capillary: 424 mg/dL — ABNORMAL HIGH (ref 70–99)

## 2022-10-13 MED ORDER — INSULIN ASPART 100 UNIT/ML IJ SOLN
27.0000 [IU] | Freq: Once | INTRAMUSCULAR | Status: AC
  Administered 2022-10-13: 27 [IU] via SUBCUTANEOUS

## 2022-10-13 MED ORDER — VALACYCLOVIR HCL 500 MG PO TABS: 1000.0000 mg | ORAL_TABLET | Freq: Every day | ORAL | Status: DC

## 2022-10-13 MED ORDER — SODIUM CHLORIDE 0.9 % IV BOLUS
500.0000 mL | Freq: Once | INTRAVENOUS | Status: AC
  Administered 2022-10-13: 500 mL via INTRAVENOUS

## 2022-10-13 MED ORDER — VALACYCLOVIR HCL 1 G PO TABS: 1000.0000 mg | ORAL_TABLET | Freq: Three times a day (TID) | ORAL | 0 refills | Status: AC

## 2022-10-13 NOTE — Discharge Instructions (Addendum)
Please your doctor's office about your high blood sugars.

## 2022-10-13 NOTE — ED Notes (Signed)
Blood sugar is 501. Dr. Langston Masker aware.

## 2022-10-13 NOTE — ED Triage Notes (Signed)
Family  notice left side facial droop last known normal was yesterday morning per family,  Report last known normal as today around 5pm when speaking with MD

## 2022-10-13 NOTE — ED Provider Notes (Signed)
[facility] Provider Note   CSN: [id number] Arrival date & time: 10/13/22  2200     History  Chief Complaint  Patient presents with   Facial Droop    DORRIAN SHOOK is a 72 y.o. female with history of hypertension, hyperlipidemia, diabetes, presented to ED with right-sided facial droop.  Patient was last known well around 4 PM today, as noted by her female partner lives with her.  This evening the patient came into the computer room and her partner noticed that the right side of her face was drooping.  The patient herself has not noticed this at times and when this began.  She denies any headache, blurred vision, numbness or weakness of arms or legs.  She denies any known history of stroke.  HPI     Home Medications Prior to Admission medications   Medication Sig Start Date End Date Taking? Authorizing Provider  amLODipine (NORVASC) 5 MG tablet Take 1 tablet (5 mg total) by mouth at bedtime. 12/19/21   [name], FNP  aspirin EC 81 MG EC tablet Take 1 tablet (81 mg total) by mouth daily. Swallow whole. 12/01/20   Regalado, Belkys A, MD  atorvastatin (LIPITOR) 10 MG tablet Take 1 tablet (10 mg total) by mouth daily. 12/29/21   [name], FNP  blood glucose meter kit and supplies KIT Dispense based on patient and insurance preference. Check blood sugars 4 times daily E11.69 12/29/20   [name], FNP  Continuous Blood Gluc Receiver (FREESTYLE LIBRE READER) DEVI 1 each by Does not apply route See admin instructions. CGM device-Freestyle [employer], Historical, MD  Continuous Blood Gluc Sensor (FREESTYLE LIBRE 14 DAY SENSOR) MISC 1 each by Does not apply route See admin instructions. CGM 14-day sensors; send refills to [employer] (in [employer])    [provider]  Finerenone (KERENDIA) 10 MG TABS Take 1 tablet by mouth daily. 01/19/22   [name], FNP  HUMALOG 100 UNIT/ML injection Inject 0.1 mLs (10 Units total) into  the skin 3 (three) times daily with meals. 05/10/21   [name], FNP  insulin degludec (TRESIBA FLEXTOUCH) 100 UNIT/ML FlexTouch Pen Inject 27 Units into the skin daily. 08/28/21   [name], FNP  Insulin Pen Needle (BD PEN NEEDLE MICRO U/F) 32G X 6 MM MISC DX:E11.65 USE TO CHECK BLOOD SUGARS THREE TIMES A DAY 05/12/20   [name], MD  Lancets Misc. MISC 1 each by Does not apply route 4 (four) times daily -  with meals and at bedtime. 12/16/18   [name], FNP  linaclotide [facility]) 145 MCG CAPS capsule Take 1 capsule (145 mcg total) by mouth daily before breakfast. 12/19/21   [name], FNP  Multiple Vitamin (MULTIVITAMIN WITH MINERALS) TABS tablet Take 1 tablet by mouth daily.    [provider]  Multiple Vitamin (MULTIVITAMIN) tablet Take 1 tablet by mouth daily.    [provider]  nystatin cream (MYCOSTATIN) Apply to affected area 2 times daily 05/30/21   Volney American, PA-C  ondansetron (ZOFRAN-ODT) 4 MG disintegrating tablet Take 1 tablet (4 mg total) by mouth every 8 (eight) hours as needed. 03/28/22   Long, Wonda Old[employer], MD  [facility] VERIO test strip USE TO test blood sugar FOUR TIMES DAILY 02/15/21   [name], FNP  pantoprazole (PROTONIX) 40 MG tablet TAKE ONE TABLET BY MOUTH ONCE DAILY 12/25/21   [name], FNP  pregabalin (LYRICA) 50 MG capsule TAKE ONE  CAPSULE BY MOUTH BEFORE BREAKFAST and TAKE ONE CAPSULE BY MOUTH EVERY EVENING and TAKE ONE CAPSULE BY MOUTH EVERYDAY AT BEDTIME 10/25/21   [name], FNP  valsartan (DIOVAN) 40 MG tablet Take 1 tablet (40 mg total) by mouth daily. 04/18/22 07/17/22  [name], FNP  Vibegron (GEMTESA) 75 MG TABS Take 75 mg by mouth daily.    [provider]  Vitamin D, Ergocalciferol, (DRISDOL) 1.25 MG (50000 UNIT) CAPS capsule TAKE ONE CAPSULE BY MOUTH ONCE WEEKLY ON WEDNESDAY Patient not taking: Reported on 01/24/2022 12/25/21   [name], FNP      Allergies    Pollen extract, Tomato, and Codeine     Review of Systems   Review of Systems  Physical Exam Updated Vital Signs BP (!) 149/69   Pulse 77   Temp 97.7 F (36.5 C)   Resp 18   LMP  (LMP Unknown)   SpO2 99%  Physical Exam Constitutional:      General: She is not in acute distress. HENT:     Head: Normocephalic and atraumatic.  Eyes:     Conjunctiva/sclera: Conjunctivae normal.     Pupils: Pupils are equal, round, and reactive to light.  Cardiovascular:     Rate and Rhythm: Normal rate and regular rhythm.  Pulmonary:     Effort: Pulmonary effort is normal. No respiratory distress.  Abdominal:     General: There is no distension.     Tenderness: There is no abdominal tenderness.  Skin:    General: Skin is warm and dry.  Neurological:     Mental Status: She is alert. Mental status is at baseline.     Sensory: No sensory deficit.     Coordination: Coordination normal.     Comments: Right-sided facial droop involving the mouth, as well as the forehead muscles and extraocular muscles Cranial nerves are otherwise intact     ED Results / Procedures / Treatments   Labs (all labs ordered are listed, but only abnormal results are displayed) Labs Reviewed  BASIC METABOLIC PANEL - Abnormal; Notable for the following components:      Result Value   Chloride 96 (*)    Glucose, Bld 501 (*)    Creatinine, Ser 1.26 (*)    GFR, Estimated 46 (*)    All other components within normal limits  CBG MONITORING, ED - Abnormal; Notable for the following components:   Glucose-Capillary 424 (*)    All other components within normal limits  CBC  URINALYSIS, ROUTINE W REFLEX MICROSCOPIC    EKG EKG Interpretation  Date/Time:  Saturday October 13 2022 22:11:42 EST Ventricular Rate:  95 PR Interval:  150 QRS Duration: 68 QT Interval:  356 QTC Calculation: 447 R Axis:   73 Text Interpretation: Sinus rhythm When compared with ECG of 28-Mar-2022 14:26, PREVIOUS ECG IS PRESENT Confirmed by Octaviano Glow [phone]) on 10/13/2022  11:12:57 PM  Radiology CT Head Wo Contrast  Result Date: 10/13/2022 CLINICAL DATA:  Neuro deficit, acute, stroke suspected right sided facial droop EXAM: CT HEAD WITHOUT CONTRAST TECHNIQUE: Contiguous axial images were obtained from the base of the skull through the vertex without intravenous contrast. RADIATION DOSE REDUCTION: This exam was performed according to the departmental dose-optimization program which includes automated exposure control, adjustment of the mA and/or kV according to patient size and/or use of iterative reconstruction technique. COMPARISON:  CT head 11/29/2020 FINDINGS: Brain: Cerebral ventricle sizes are concordant with the degree of cerebral volume loss. Patchy and confluent areas of  decreased attenuation are noted throughout the deep and periventricular white matter of the cerebral hemispheres bilaterally, compatible with chronic microvascular ischemic disease. no evidence of large-territorial acute infarction. No parenchymal hemorrhage. No mass lesion. No extra-axial collection. No mass effect or midline shift. No hydrocephalus. Basilar cisterns are patent. Vascular: No hyperdense vessel. Skull: No acute fracture or focal lesion. Sinuses/Orbits: Paranasal sinuses and mastoid air cells are clear. The orbits are unremarkable. Other: None. IMPRESSION: No acute intracranial abnormality. Electronically Signed   By: Iven Finn M.D.   On: 10/13/2022 22:44    Procedures Procedures    Medications Ordered in ED Medications  insulin aspart (novoLOG) injection 27 Units (has no administration in time range)  sodium chloride 0.9 % bolus 500 mL (has no administration in time range)  valACYclovir (VALTREX) tablet 1,000 mg (has no administration in time range)    ED Course/ Medical Decision Making/ A&P                             Medical Decision Making Amount and/or Complexity of Data Reviewed Labs: ordered. Radiology: ordered.  Risk Prescription drug  management.   This patient presents to the ED with concern for right-sided facial droop. This involves an extensive number of treatment options, and is a complaint that carries with it a high risk of complications and morbidity.  The differential diagnosis includes Bell's palsy versus facial nerve palsy versus CVA versus other.  Co-morbidities that complicate the patient evaluation: Stroke risk factors including high blood pressure, high cholesterol, diabetes  Additional history obtained from patient's partner at bedside   I ordered and personally interpreted labs.  The pertinent results include: Hyperglycemia, no anion gap, suspect this is likely a chronic issue.  Patient takes high-dose insulin at home reports compliance but is not taken her evening insulin today, 27 units of humalog  I ordered imaging studies including CT head I independently visualized and interpreted imaging which showed no acute infarct I agree with the radiologist interpretation  The patient was maintained on a cardiac monitor.  I personally viewed and interpreted the cardiac monitored which showed an underlying rhythm of: Sinus rhythm.  Per my interpretation the patient's ECG shows no acute ischemic findings  I ordered medication including 500 cc of fluid and 27 units of insulin for hyperglycemia  Will start the patient on Valtrex as well.  Due to her poorly controlled blood sugars I do not believe that it would be safe to start her on steroids for her Bell's palsy.  I have reviewed the patients home medicines and have made adjustments as needed  After the interventions noted above, I reevaluated the patient and found that they have: stayed the same  The weakness involving extraocular muscles appear more profound on my reassessment, again consistent with peripheral nerve palsy.  She does not have any new neurological deficits to suggest CVA or central cause.  Dispostion:  After consideration of the diagnostic  results and the patients response to treatment, I feel that the patent would benefit from follow-up with neurology for suspected Bell's palsy.  We discussed Bell's palsy precautions at home including taping her eye shut at night.  She verbalized understanding.  Signed out to Dr. Stark Jock pending repeat blood sugar after treatment - anticipate discharge home afterwards        Final Clinical Impression(s) / ED Diagnoses Final diagnoses:  Bell's palsy  Hyperglycemia    Rx / DC Orders ED Discharge  Orders     None         Wyvonnia Dusky, MD 10/13/22 2333

## 2022-10-14 LAB — URINALYSIS, ROUTINE W REFLEX MICROSCOPIC
Bilirubin Urine: NEGATIVE
Glucose, UA: >1000 mg/dL — AB
Hgb urine dipstick: NEGATIVE
Ketones, ur: NEGATIVE mg/dL
Nitrite: NEGATIVE
Specific Gravity, Urine: 1.027 (ref 1.005–1.030)
pH: 6 (ref 5.0–8.0)

## 2022-10-14 LAB — CBG MONITORING, ED: Glucose-Capillary: 221 mg/dL — ABNORMAL HIGH (ref 70–99)

## 2022-10-15 ENCOUNTER — Encounter: Payer: Self-pay | Admitting: Neurology

## 2022-11-12 ENCOUNTER — Ambulatory Visit: Payer: 59 | Admitting: Neurology

## 2022-11-21 ENCOUNTER — Encounter: Payer: Self-pay | Admitting: Neurology

## 2022-11-21 ENCOUNTER — Ambulatory Visit (INDEPENDENT_AMBULATORY_CARE_PROVIDER_SITE_OTHER): Payer: 59 | Admitting: Neurology

## 2022-11-21 VITALS — BP 157/75 | HR 86 | Ht 61.6 in | Wt 152.0 lb

## 2022-11-21 DIAGNOSIS — R292 Abnormal reflex: Secondary | ICD-10-CM

## 2022-11-21 DIAGNOSIS — E1142 Type 2 diabetes mellitus with diabetic polyneuropathy: Secondary | ICD-10-CM | POA: Diagnosis not present

## 2022-11-21 DIAGNOSIS — R202 Paresthesia of skin: Secondary | ICD-10-CM | POA: Diagnosis not present

## 2022-11-21 MED ORDER — DULOXETINE HCL 30 MG PO CPEP: 30.0000 mg | ORAL_CAPSULE | Freq: Every day | ORAL | 5 refills | Status: DC

## 2022-11-21 NOTE — Progress Notes (Signed)
[facility] Clinic Note - Initial Visit   Date: 11/21/2022   [name] MRN: [id number] DOB: Aug 11, 1951   Dear Dr. Langston Masker:   Thank you for your kind referral of [name] for consultation of left Bell's palsy. Although her history is well known to you, please allow [place] to reiterate it for the purpose of our medical record. The patient was accompanied to the clinic by roommate who also provides collateral information.     [name] is a 72 y.o. right-handed female with poorly-controlled diabetes mellitus, hypertension, hyperlipidemia, diabetic neuropathy, GERD, and fibromyalgia presenting for evaluation of left Bell's palsy.   IMPRESSION/PLAN: Left Bell's palsy, improved.  Now with mild residual weakness of the orbicularis oris and buccinator muscle.  Overall, significantly improved.   - Continue to monitor  2. Hyperreflexia with cervicalgia  - MRI cervical spine wo contrast to evaluate for cervical canal stenosis  3.  Painful diabetic neuropathy affecting the feet   - Continue Lyrica 100mg  TID  - Start Cymbalta 30mg  daily  - Emphasized importance of optimizing diabetes control  - Patient educated on daily foot inspection, fall prevention, and safety precautions around the home.  Return to clinic in 4 months  ------------------------------------------------------------- History of present illness: She presented to the ER on 2/17 with acute onset of left facial weakness and was diagnosed with Bell's palsy.  CT head was negative for acute pathology.  Over the past several weeks, she has noticed gradual improvement and no longer has difficulty with opening/closing the eyes or eating.  She is able to use a straw, drooling much less, and less tearing from the eye.    She also complains of right sided neck pain, numbness/tingling in the hands, burning stinging pain involving the feet.  Sensation above the ankles is normal.  She  has some imbalance, no falls and walks unassisted. She endorses joint pain in the legs. She has diabetic neuropathy and takes Lyrica 100mg  TID.  She has not had prior cervical imaging.   Out-side paper records, electronic medical record, and images have been reviewed where available and summarized as:  CT head wo contrast 10/13/2022:  No acute intracranial abnormality.   Lab Results  Component Value Date   HGBA1C 9.8 (H) 01/24/2022   Lab Results  Component Value Date   [id number] 01/24/2022   Lab Results  Component Value Date   TSH 1.610 01/24/2022   No results found for: "ESRSEDRATE", "POCTSEDRATE"  Past Medical History:  Diagnosis Date   Aortic stenosis    mild on 04/07/19 echo   Arthritis    L knee, hands, back    Asthma    Diabetes mellitus type 2, insulin dependent (HCC)    Fibromyalgia    Full dentures    Full dentures    GERD (gastroesophageal reflux disease)    H/O hiatal hernia    Headache    h/o migraines - was followed for a time with a wellness doctor   History of esophageal dilatation    History of rhabdomyolysis    03/ 2015   Hyperlipidemia    Hypertension    Insulin pump in place    since 12/ 2014--  [employer]   Lumbar disc herniation    right leg weakness   Moderate nonproliferative diabetic retinopathy ([place]) 04/03/2022   Osteoarthritis of left knee, primary localized 08/17/2014   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   Renal insufficiency    Rhabdomyolysis 11/15/2013   SUI (stress urinary incontinence,  female)    Syncope 12/27/2017   Wears glasses    Wears glasses     Past Surgical History:  Procedure Laterality Date   BLADDER SUSPENSION N/A 03/09/2014   Procedure: CYSTOSCOPY/SLING;  Surgeon: Reece Packer, MD;  Location: [facility];  Service: Urology;  Laterality: N/A;   CARDIAC CATHETERIZATION  07-20-2009   [facility] [place]  NORMAL  LVEF/  NORMAL CORONARY AND RENAL ARTERIES   CARPAL TUNNEL RELEASE Right 2000    CHOLECYSTECTOMY  1990   COLONOSCOPY WITH ESOPHAGOGASTRODUODENOSCOPY (EGD)  06-18-2002   ESOPHAGOGASTRODUODENOSCOPY (EGD) WITH ESOPHAGEAL DILATION  06-12-2010   EXCISION LEFT UPPER ARM MASS  01-29-2014   EYE SURGERY  2010   laser on R eye   INTERSTIM IMPLANT REMOVAL N/A 03/06/2019   Procedure: REMOVAL OF Barrie Lyme IMPLANT;  Surgeon: Bjorn Loser, MD;  Location: [facility];  Service: Urology;  Laterality: N/A;   KNEE ARTHROSCOPY Right 1989  &  2002   LUMBAR LAMINECTOMY N/A 02/19/2020   Procedure: right L4 hemiaminectomy, microdiscectomy;  Surgeon: Marybelle Killings, MD;  Location: [facility];  Service: Orthopedics;  Laterality: N/A;   MULTIPLE TOOTH EXTRACTIONS     NEGATIVE SLEEP STUDY  2012   PER PT   PARTIAL KNEE ARTHROPLASTY Left 08/17/2014   Procedure: LEFT UNICOMPARTMENTAL KNEE;  Surgeon: Johnny Bridge, MD;  Location: [facility];  Service: Orthopedics;  Laterality: Left;     Medications:  Outpatient Encounter Medications as of 11/21/2022  Medication Sig   amLODipine (NORVASC) 5 MG tablet Take 1 tablet (5 mg total) by mouth at bedtime. (Patient taking differently: Take 10 mg by mouth at bedtime. Take 10 mg in the morning.)   aspirin EC 81 MG EC tablet Take 1 tablet (81 mg total) by mouth daily. Swallow whole.   atorvastatin (LIPITOR) 10 MG tablet Take 1 tablet (10 mg total) by mouth daily.   blood glucose meter kit and supplies KIT Dispense based on patient and insurance preference. Check blood sugars 4 times daily E11.69   Continuous Blood Gluc Receiver (FREESTYLE LIBRE READER) DEVI 1 each by Does not apply route See admin instructions. CGM device-Freestyle Libre   Continuous Blood Gluc Sensor (FREESTYLE LIBRE 14 DAY SENSOR) MISC 1 each by Does not apply route See admin instructions. CGM 14-day sensors; send refills to [employer] (in Epic)   Finerenone (KERENDIA) 10 MG TABS Take 1 tablet by mouth daily.   HUMALOG 100 UNIT/ML injection Inject 0.1 mLs (10 Units total) into the skin 3 (three)  times daily with meals. (Patient taking differently: Inject 8 Units into the skin 3 (three) times daily with meals. Inject 8 units 3 times a day. Max daily dose of 40 units.)   insulin degludec (TRESIBA FLEXTOUCH) 100 UNIT/ML FlexTouch Pen Inject 27 Units into the skin daily. (Patient taking differently: Inject 35 Units into the skin daily. Inject 35 units subcutaneous route daily)   Insulin Pen Needle (BD PEN NEEDLE MICRO U/F) 32G X 6 MM MISC DX:E11.65 USE TO CHECK BLOOD SUGARS THREE TIMES A DAY   Lancets Misc. MISC 1 each by Does not apply route 4 (four) times daily -  with meals and at bedtime.   linaclotide (LINZESS) 145 MCG CAPS capsule Take 1 capsule (145 mcg total) by mouth daily before breakfast.   ONETOUCH VERIO test strip USE TO test blood sugar FOUR TIMES DAILY   OZEMPIC, 0.25 OR 0.5 MG/DOSE, 2 MG/3ML SOPN Inject 0.5 mg into the skin once  a week.   pantoprazole (PROTONIX) 40 MG tablet TAKE ONE TABLET BY MOUTH ONCE DAILY   pregabalin (LYRICA) 50 MG capsule TAKE ONE CAPSULE BY MOUTH BEFORE BREAKFAST and TAKE ONE CAPSULE BY MOUTH EVERY EVENING and TAKE ONE CAPSULE BY MOUTH EVERYDAY AT BEDTIME (Patient taking differently: 100 mg. Take 1 capsule three times daily)   valsartan (DIOVAN) 40 MG tablet Take 1 tablet (40 mg total) by mouth daily. (Patient taking differently: Take 160 mg by mouth daily. Take one tablet daily)   Vibegron (GEMTESA) 75 MG TABS Take 75 mg by mouth daily.   [DISCONTINUED] Multiple Vitamin (MULTIVITAMIN WITH MINERALS) TABS tablet Take 1 tablet by mouth daily. (Patient not taking: Reported on 11/21/2022)   [DISCONTINUED] Multiple Vitamin (MULTIVITAMIN) tablet Take 1 tablet by mouth daily. (Patient not taking: Reported on 11/21/2022)   [DISCONTINUED] nystatin cream (MYCOSTATIN) Apply to affected area 2 times daily   [DISCONTINUED] ondansetron (ZOFRAN-ODT) 4 MG disintegrating tablet Take 1 tablet (4 mg total) by mouth every 8 (eight) hours as needed.   [DISCONTINUED] Vitamin D,  Ergocalciferol, (DRISDOL) 1.25 MG (50000 UNIT) CAPS capsule TAKE ONE CAPSULE BY MOUTH ONCE WEEKLY ON WEDNESDAY (Patient not taking: Reported on 01/24/2022)   No facility-administered encounter medications on file as of 11/21/2022.    Allergies:  Allergies  Allergen Reactions   Pollen Extract Other (See Comments)    Congestion/sneezing   Tomato Hives and Itching   Codeine Hives, Itching and Nausea And Vomiting    Family History: Family History  Problem Relation Age of Onset   Diabetes Mother    Hypertension Mother    Lung cancer Mother        smoked   Diabetes Father    Hypertension Father    Lung cancer Father        smoked   Heart failure Sister    Diabetes Brother    Diabetes Maternal Grandmother    Cancer Maternal Grandmother     Social History: Social History   Tobacco Use   Smoking status: Never   Smokeless tobacco: Never  Vaping Use   Vaping Use: Never used  Substance Use Topics   Alcohol use: No   Drug use: No   Social History   Social History Narrative   Right Handed   Lives in a one story home    Vital Signs:  BP (!) 157/75   Pulse 86   Ht 5' 1.6" (1.565 m)   Wt 152 lb (68.9 kg)   LMP  (LMP Unknown)   SpO2 99%   BMI 28.16 kg/m   Neurological Exam: MENTAL STATUS including orientation to time, place, person, recent and remote memory, attention span and concentration, language, and fund of knowledge is normal.  Speech is not dysarthric.  CRANIAL NERVES: II:  No visual field defects.     III-IV-VI: Pupils equal round and reactive to light.  Normal conjugate, extra-ocular eye movements in all directions of gaze.  No nystagmus.  No ptosis.   V:  Normal facial sensation.    VII:  Mild asymmetrical smile with flattening of the left nasolabial fold.  Mild buccinator and orbicularis oris weakness on the left side only (5-/5)   VIII:  Normal hearing and vestibular function.   IX-X:  Normal palatal movement.   XI:  Normal shoulder shrug and head  rotation.   XII:  Normal tongue strength and range of motion, no deviation or fasciculation.  MOTOR:  No atrophy, fasciculations or abnormal movements.  No pronator  drift.   Upper Extremity:  Right  Left  Deltoid  5/5   5/5   Biceps  5/5   5/5   Triceps  5/5   5/5   Wrist extensors  5/5   5/5   Wrist flexors  5/5   5/5   Finger extensors  5/5   5/5   Finger flexors  5/5   5/5   Dorsal interossei  5/5   5/5   Abductor pollicis  5/5   5/5   Tone (Ashworth scale)  0  0   Lower Extremity:  Right  Left  Hip flexors  5/5   5/5   Knee flexors  5/5   5/5   Knee extensors  5/5   5/5   Dorsiflexors  5/5   5/5   Plantarflexors  5/5   5/5   Toe extensors  5/5   5/5   Toe flexors  5/5   5/5   Tone (Ashworth scale)  0  0   MSRs:                                           Right        Left brachioradialis 2+  2+  biceps 2+  2+  triceps 2+  2+  patellar 2+  2+  ankle jerk 0  0  Hoffman no  no  plantar response down  down   SENSORY:  Vibration and temperature reduced below the ankles bilaterally.  Sensation intact in the hands.    COORDINATION/GAIT: Normal finger-to- nose-finger.  Intact rapid alternating movements bilaterally.  Gait appears mildly wide-based, antalgic, unassisted.   Thank you for allowing me to participate in patient's care.  If I can answer any additional questions, I would be pleased to do so.    Sincerely,    Chaley Castellanos K. Posey Pronto, DO

## 2022-11-21 NOTE — Patient Instructions (Addendum)
MRI cervical spine without contrast  Start Cymbalta 30mg  daily  Return to clinic in 4 months

## 2022-11-23 ENCOUNTER — Ambulatory Visit
Admission: RE | Admit: 2022-11-23 | Discharge: 2022-11-23 | Disposition: A | Discharge status: Home / Self Care | Payer: 59 | Source: Ambulatory Visit | Attending: Pulmonary Disease | Admitting: Pulmonary Disease

## 2022-11-23 DIAGNOSIS — Z1231 Encounter for screening mammogram for malignant neoplasm of breast: Secondary | ICD-10-CM

## 2022-12-12 ENCOUNTER — Other Ambulatory Visit: Payer: 59

## 2022-12-19 ENCOUNTER — Other Ambulatory Visit: Payer: 59

## 2023-01-02 ENCOUNTER — Ambulatory Visit
Admission: RE | Admit: 2023-01-02 | Discharge: 2023-01-02 | Disposition: A | Discharge status: Home / Self Care | Payer: 59 | Source: Ambulatory Visit | Attending: Neurology | Admitting: Neurology

## 2023-01-02 DIAGNOSIS — R292 Abnormal reflex: Secondary | ICD-10-CM

## 2023-01-02 DIAGNOSIS — E1142 Type 2 diabetes mellitus with diabetic polyneuropathy: Secondary | ICD-10-CM

## 2023-01-02 DIAGNOSIS — R202 Paresthesia of skin: Secondary | ICD-10-CM

## 2023-01-11 ENCOUNTER — Other Ambulatory Visit: Payer: Self-pay

## 2023-01-11 DIAGNOSIS — R202 Paresthesia of skin: Secondary | ICD-10-CM

## 2023-01-22 ENCOUNTER — Ambulatory Visit: Payer: Self-pay

## 2023-01-22 ENCOUNTER — Ambulatory Visit
Admission: EM | Admit: 2023-01-22 | Discharge: 2023-01-22 | Disposition: A | Discharge status: Home / Self Care | Payer: 59 | Attending: Nurse Practitioner | Admitting: Nurse Practitioner

## 2023-01-22 DIAGNOSIS — N898 Other specified noninflammatory disorders of vagina: Secondary | ICD-10-CM | POA: Insufficient documentation

## 2023-01-22 DIAGNOSIS — N3001 Acute cystitis with hematuria: Secondary | ICD-10-CM | POA: Diagnosis present

## 2023-01-22 LAB — POCT URINALYSIS DIP (MANUAL ENTRY)
Bilirubin, UA: NEGATIVE
Glucose, UA: >=1000 mg/dL — AB
Nitrite, UA: POSITIVE — AB
Protein Ur, POC: >=300 mg/dL — AB
Spec Grav, UA: 1.02 (ref 1.010–1.025)
Urobilinogen, UA: 1 E.U./dL
pH, UA: 6 (ref 5.0–8.0)

## 2023-01-22 MED ORDER — FLUCONAZOLE 150 MG PO TABS
150.0000 mg | ORAL_TABLET | Freq: Every day | ORAL | 0 refills | Status: AC
Start: 2023-01-22 — End: 2023-01-24

## 2023-01-22 MED ORDER — CEPHALEXIN 500 MG PO CAPS
500.0000 mg | ORAL_CAPSULE | Freq: Two times a day (BID) | ORAL | 0 refills | Status: AC
Start: 2023-01-22 — End: 2023-01-29

## 2023-01-22 NOTE — ED Triage Notes (Signed)
Pt presents to UC w/ c/o worsening dysuria since yesterday after finishing prescribed antibiotic for UTI. Denies hematuria. Pt states there is "something growing down there." Pt c/o itching in vaginal area. Pt has used "vagistat" without relief.

## 2023-01-22 NOTE — Discharge Instructions (Signed)
We will send her urine for culture and start her on Keflex twice daily for 7 days to treat your UTI.  Start Diflucan as prescribed to treat your yeast infection Rest and fluids Please follow-up with your PCP if symptoms do not improve Please go to the ER if you develop any worsening symptoms

## 2023-01-22 NOTE — ED Provider Notes (Signed)
[facility]    CSN: [id number] Arrival date & time: 01/22/23  1528      History   Chief Complaint No chief complaint on file.   HPI [name] is a 72 y.o. female presents for evaluation of dysuria.  Patient reports several months of urinary incontinence with waxing and waning UTI symptoms.  A week ago she was treated with Macrobid by her urologist.  She completed that yesterday but reports continued urgency and frequency with burning.  No fevers, nausea/vomiting, flank pain.  She also reports some vaginal discharge since starting the antibiotic and does have a history of antibiotic induced yeast infections.  She was not prescribed preventative medicine.  She has been using over-the-counter Vagisil cream to the area without improvement.  She is also been taking Azo.  No other concerns at this time.  HPI  Past Medical History:  Diagnosis Date   Aortic stenosis    mild on 04/07/19 echo   Arthritis    L knee, hands, back    Asthma    Diabetes mellitus type 2, insulin dependent (HCC)    Fibromyalgia    Full dentures    Full dentures    GERD (gastroesophageal reflux disease)    H/O hiatal hernia    Headache    h/o migraines - was followed for a time with a wellness doctor   History of esophageal dilatation    History of rhabdomyolysis    03/ 2015   Hyperlipidemia    Hypertension    Insulin pump in place    since 12/ 2014--  [employer]   Lumbar disc herniation    right leg weakness   Moderate nonproliferative diabetic retinopathy (HCC) 04/03/2022   Osteoarthritis of left knee, primary localized 08/17/2014   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   Renal insufficiency    Rhabdomyolysis 11/15/2013   SUI (stress urinary incontinence, female)    Syncope 12/27/2017   Wears glasses    Wears glasses     Patient Active Problem List   Diagnosis Date Noted   Moderate nonproliferative diabetic retinopathy (HCC) 04/03/2022   Abdominal bloating 07/03/2021   Chronic idiopathic  constipation 07/03/2021   Colon cancer screening 07/03/2021   Diverticulosis of large intestine without perforation or abscess without bleeding 07/03/2021   Dysphagia 07/03/2021   Flatulence, eructation and gas pain 07/03/2021   Hiatal hernia 07/03/2021   Quadriceps weakness 02/06/2021   Syncope 11/29/2020   Mild aortic stenosis 11/29/2020   Irritable bowel syndrome with diarrhea 10/10/2020   Gastroesophageal reflux disease with esophagitis 10/10/2020   Postlaminectomy syndrome, not elsewhere classified 07/03/2020   S/P lumbar laminectomy 03/30/2020   Localized swelling, mass and lump, multiple sites 03/27/2018   Type II diabetes mellitus with renal manifestations (HCC) 12/27/2017   HLD (hyperlipidemia) 12/27/2017   HTN (hypertension) 12/27/2017   CKD (chronic kidney disease), stage III (HCC) 12/27/2017   Cough variant asthma 12/01/2015   Osteoarthritis of left knee, primary localized 08/17/2014   Knee osteoarthritis 08/17/2014   Hypoglycemia 11/15/2013   Other and unspecified hyperlipidemia 08/06/2013   Type II diabetes mellitus, uncontrolled 07/20/2013   Varicose veins of lower extremities with other complications 03/19/2013   Fibromyalgia    GERD 05/18/2010   ABDOMINAL PAIN, GENERALIZED 05/02/2010   OBESITY 04/20/2010   BURSITIS, LEFT SHOULDER 03/09/2010   Lipoma of arm s/p excision 01/29/2014 01/10/2010   DEPRESSION 12/27/2009   BACK PAIN WITH RADICULOPATHY 12/27/2009   CYSTITIS, ACUTE 10/21/2009   MICROSCOPIC HEMATURIA 10/18/2009   KNEE  PAIN, BILATERAL 10/18/2009   NEUROPATHY 09/08/2009   Asthma 09/08/2009   STRESS INCONTINENCE 09/08/2009   CHEST PAIN 07/19/2009   ESOPHAGEAL STRICTURE 08/27/2005    Past Surgical History:  Procedure Laterality Date   BLADDER SUSPENSION N/A 03/09/2014   Procedure: CYSTOSCOPY/SLING;  Surgeon: Martina Sinner, MD;  Location[facility];  Service: Urology;  Laterality: N/A;   CARDIAC CATHETERIZATION  07-20-2009   DR  Nicki Guadalajara   MODERATE LVH/  NORMAL  LVEF/  NORMAL CORONARY AND RENAL ARTERIES   CARPAL TUNNEL RELEASE Right 2000   CHOLECYSTECTOMY  1990   COLONOSCOPY WITH ESOPHAGOGASTRODUODENOSCOPY (EGD)  06-18-2002   ESOPHAGOGASTRODUODENOSCOPY (EGD) WITH ESOPHAGEAL DILATION  06-12-2010   EXCISION LEFT UPPER ARM MASS  01-29-2014   EYE SURGERY  2010   laser on R eye   INTERSTIM IMPLANT REMOVAL N/A 03/06/2019   Procedure: REMOVAL OF Leane Platt IMPLANT;  Surgeon: Alfredo Martinez, MD;  Location: [facility];  Service: Urology;  Laterality: N/A;   KNEE ARTHROSCOPY Right 1989  &  2002   LUMBAR LAMINECTOMY N/A 02/19/2020   Procedure: right L4 hemiaminectomy, microdiscectomy;  Surgeon: Eldred Manges, MD;  Location: [facility] OR;  Service: Orthopedics;  Laterality: N/A;   MULTIPLE TOOTH EXTRACTIONS     NEGATIVE SLEEP STUDY  2012   PER PT   PARTIAL KNEE ARTHROPLASTY Left 08/17/2014   Procedure: LEFT UNICOMPARTMENTAL KNEE;  Surgeon: Eulas Post, MD;  Location: [facility];  Service: Orthopedics;  Laterality: Left;    OB History     Gravida  1   Para  1   Term  1   Preterm      AB      Living  1      SAB      IAB      Ectopic      Multiple      Live Births  1            Home Medications    Prior to Admission medications   Medication Sig Start Date End Date Taking? Authorizing Provider  cephALEXin (KEFLEX) 500 MG capsule Take 1 capsule (500 mg total) by mouth 2 (two) times daily for 7 days. 01/22/23 01/29/23 Yes Radford Pax, NP  fluconazole (DIFLUCAN) 150 MG tablet Take 1 tablet (150 mg total) by mouth daily for 2 doses. Take 1 dose today and may repeat in 3 days if symptoms persist 01/22/23 01/24/23 Yes Radford Pax, NP  amLODipine (NORVASC) 5 MG tablet Take 1 tablet (5 mg total) by mouth at bedtime. Patient taking differently: Take 10 mg by mouth at bedtime. Take 10 mg in the morning. 12/19/21   Arnette Felts, FNP  aspirin EC 81 MG EC tablet Take 1 tablet (81 mg total) by mouth daily. Swallow  whole. 12/01/20   Regalado, Belkys A, MD  atorvastatin (LIPITOR) 10 MG tablet Take 1 tablet (10 mg total) by mouth daily. 12/29/21   Arnette Felts, FNP  blood glucose meter kit and supplies KIT Dispense based on patient and insurance preference. Check blood sugars 4 times daily E11.69 12/29/20   Arnette Felts, FNP  Continuous Blood Gluc Receiver (FREESTYLE LIBRE READER) DEVI 1 each by Does not apply route See admin instructions. CGM device-Freestyle [employer], Historical, MD  Continuous Blood Gluc Sensor (FREESTYLE LIBRE 14 DAY SENSOR) MISC 1 each by Does not apply route See admin instructions. CGM 14-day sensors; send refills to [facility] (in [employer])    [provider]  DULoxetine (CYMBALTA) 30 MG capsule Take 1 capsule (30 mg total) by mouth daily. 11/21/22   Patel, Donika K, DO  Finerenone (KERENDIA) 10 MG TABS Take 1 tablet by mouth daily. 01/19/22   Arnette Felts, FNP  HUMALOG 100 UNIT/ML injection Inject 0.1 mLs (10 Units total) into the skin 3 (three) times daily with meals. Patient taking differently: Inject 8 Units into the skin 3 (three) times daily with meals. Inject 8 units 3 times a day. Max daily dose of 40 units. 05/10/21   Arnette Felts, FNP  insulin degludec (TRESIBA FLEXTOUCH) 100 UNIT/ML FlexTouch Pen Inject 27 Units into the skin daily. Patient taking differently: Inject 35 Units into the skin daily. Inject 35 units subcutaneous route daily 08/28/21   Arnette Felts, FNP  Insulin Pen Needle (BD PEN NEEDLE MICRO U/F) 32G X 6 MM MISC DX:E11.65 USE TO CHECK BLOOD SUGARS THREE TIMES A DAY 05/12/20   Dorothyann Peng, MD  Lancets Misc. MISC 1 each by Does not apply route 4 (four) times daily -  with meals and at bedtime. 12/16/18   Arnette Felts, FNP  linaclotide [facility]) 145 MCG CAPS capsule Take 1 capsule (145 mcg total) by mouth daily before breakfast. 12/19/21   Arnette Felts, FNP  [facility] VERIO test strip USE TO test blood sugar FOUR TIMES DAILY 02/15/21   Arnette Felts, FNP   OZEMPIC, 0.25 OR 0.5 MG/DOSE, 2 MG/3ML SOPN Inject 0.5 mg into the skin once a week.    [provider]  pantoprazole (PROTONIX) 40 MG tablet TAKE ONE TABLET BY MOUTH ONCE DAILY 12/25/21   Arnette Felts, FNP  pregabalin (LYRICA) 50 MG capsule TAKE ONE CAPSULE BY MOUTH BEFORE BREAKFAST and TAKE ONE CAPSULE BY MOUTH EVERY EVENING and TAKE ONE CAPSULE BY MOUTH EVERYDAY AT BEDTIME Patient taking differently: 100 mg. Take 1 capsule three times daily 10/25/21   Arnette Felts, FNP  valsartan (DIOVAN) 40 MG tablet Take 1 tablet (40 mg total) by mouth daily. Patient taking differently: Take 160 mg by mouth daily. Take one tablet daily 04/18/22 11/21/22  Arnette Felts, FNP  Vibegron (GEMTESA) 75 MG TABS Take 75 mg by mouth daily.    [provider]    Family History Family History  Problem Relation Age of Onset   Diabetes Mother    Hypertension Mother    Lung cancer Mother        smoked   Diabetes Father    Hypertension Father    Lung cancer Father        smoked   Heart failure Sister    Diabetes Brother    Diabetes Maternal Grandmother    Cancer Maternal Grandmother     Social History Social History   Tobacco Use   Smoking status: Never   Smokeless tobacco: Never  Vaping Use   Vaping Use: Never used  Substance Use Topics   Alcohol use: No   Drug use: No     Allergies   Pollen extract, Tomato, and Codeine   Review of Systems Review of Systems  Genitourinary:  Positive for dysuria and vaginal discharge.     Physical Exam Triage Vital Signs ED Triage Vitals [01/22/23 1551]  Enc Vitals Group     BP (!) 179/88     Pulse Rate 80     Resp 18     Temp 98.5 F (36.9 C)     Temp Source Oral     SpO2 96 %     Weight  Height      Head Circumference      Peak Flow      Pain Score      Pain Loc      Pain Edu?      Excl. in GC?    No data found.  Updated Vital Signs BP (!) 179/88 (BP Location: Right Arm)   Pulse 80   Temp 98.5 F (36.9 C) (Oral)    Resp 18   LMP  (LMP Unknown)   SpO2 96%   Visual Acuity Right Eye Distance:   Left Eye Distance:   Bilateral Distance:    Right Eye Near:   Left Eye Near:    Bilateral Near:     Physical Exam Vitals and nursing note reviewed.  Constitutional:      Appearance: Normal appearance.  HENT:     Head: Normocephalic and atraumatic.  Eyes:     Pupils: Pupils are equal, round, and reactive to light.  Cardiovascular:     Rate and Rhythm: Normal rate.  Pulmonary:     Effort: Pulmonary effort is normal.  Abdominal:     Tenderness: There is no right CVA tenderness or left CVA tenderness.  Skin:    General: Skin is warm and dry.  Neurological:     General: No focal deficit present.     Mental Status: She is alert and oriented to person, place, and time.  Psychiatric:        Mood and Affect: Mood normal.        Behavior: Behavior normal.      UC Treatments / Results  Labs (all labs ordered are listed, but only abnormal results are displayed) Labs Reviewed  POCT URINALYSIS DIP (MANUAL ENTRY) - Abnormal; Notable for the following components:      Result Value   Color, UA orange (*)    Clarity, UA turbid (*)    Glucose, UA >=1,000 (*)    Ketones, POC UA trace (5) (*)    Blood, UA moderate (*)    Protein Ur, POC >=300 (*)    Nitrite, UA Positive (*)    Leukocytes, UA Large (3+) (*)    All other components within normal limits  URINE CULTURE  CERVICOVAGINAL ANCILLARY ONLY   Basic metabolic panel Order: [id number] Status: Final result     Visible to patient: Yes (not seen)     Next appt: 01/31/2023 at 11:45 AM in Rehabilitation (Samantha C Pexa, PT)   0 Result Notes     1 HM Topic          Component Ref Range & Units 3 mo ago (10/13/22) 10 mo ago (03/28/22) 12 mo ago (01/24/22) 1 yr ago (09/26/21) 1 yr ago (04/13/21) 2 yr ago (12/13/20) 2 yr ago (12/01/20)  Sodium 135 - 145 mmol/L 135 138 141 R 143 R 131 Low  R 143 R 139  Potassium 3.5 - 5.1 mmol/L 3.8 3.6 4.9 R 4.3  R 3.9 R 4.2 R 3.8  Chloride 98 - 111 mmol/L 96 Low  96 Low  100 R 102 R 92 Low  R 100 R 104  CO2 22 - 32 mmol/L 29 29 26  R 26 R 21 R 24 R 29  Glucose, Bld 70 - 99 mg/dL 409 High Panic  811 High  CM 200 High  79 499 High  R 224 High  R 170 High  CM  Comment: Glucose reference range applies only to samples taken after fasting for at least 8  hours. REPEATED TO VERIFY CRITICAL RESULT CALLED TO, READ BACK BY AND VERIFIED WITH: C MAYNARD RN, 10/13/2022 @ 2311 BY J YANG  BUN 8 - 23 mg/dL 13 15 16  R 17 R 10 R 8 R 10  Creatinine, Ser 0.44 - 1.00 mg/dL 4.09 High  8.11 High  9.14 High  R 1.22 High  R 1.19 High  R 1.22 High  R 1.04 High   Calcium 8.9 - 10.3 mg/dL 9.4 9.7 9.8 R 9.8 R 8.7 R 9.8 R 8.7 Low   GFR, Estimated >60 mL/min 46 Low  46 Low  CM     59 Low  CM  Comment: (NOTE) Calculated using the CKD-EPI Creatinine Equation (2021)  Anion gap 5 - 15 10 13  CM     6 CM  Comment: Performed at [facility], [address] [place], [place] [address]  Resulting Agency [facility]      EKG   Radiology No results found.  Procedures Procedures (including critical care time)  Medications Ordered in UC Medications - No data to display  Initial Impression / Assessment and Plan / UC Course  I have reviewed the triage vital signs and the nursing notes.  Pertinent labs & imaging results that were available during my care of the patient were reviewed by me and considered in my medical decision making (see chart for details).     Reviewed exam and symptoms with patient.  Patient did take Azo so UA unreliable.  Will send for culture and treat based on symptoms Start Keflex twice daily for 7 days Diflucan for yeast infection Will call patient with results of vaginal swab done today if positive Rest and fluids Follow-up with PCP if symptoms do not improve ER precautions reviewed and patient verbalized  understanding Final Clinical Impressions(s) / UC Diagnoses   Final diagnoses:  Vaginal discharge  Acute cystitis with hematuria     Discharge Instructions      We will send her urine for culture and start her on Keflex twice daily for 7 days to treat your UTI.  Start Diflucan as prescribed to treat your yeast infection Rest and fluids Please follow-up with your PCP if symptoms do not improve Please go to the ER if you develop any worsening symptoms     ED Prescriptions     Medication Sig Dispense Auth. Provider   fluconazole (DIFLUCAN) 150 MG tablet Take 1 tablet (150 mg total) by mouth daily for 2 doses. Take 1 dose today and may repeat in 3 days if symptoms persist 2 tablet Radford Pax, NP   cephALEXin (KEFLEX) 500 MG capsule Take 1 capsule (500 mg total) by mouth 2 (two) times daily for 7 days. 14 capsule Radford Pax, NP      PDMP not reviewed this encounter.   Radford Pax, NP 01/22/23 [phone]

## 2023-01-23 LAB — URINE CULTURE

## 2023-01-23 LAB — CERVICOVAGINAL ANCILLARY ONLY
Bacterial Vaginitis (gardnerella): NEGATIVE
Candida Glabrata: NEGATIVE
Candida Vaginitis: POSITIVE — AB
Comment: NEGATIVE
Comment: NEGATIVE
Comment: NEGATIVE

## 2023-01-31 ENCOUNTER — Ambulatory Visit: Payer: 59

## 2023-02-12 NOTE — Therapy (Signed)
OUTPATIENT PHYSICAL THERAPY UPPER EXTREMITY EVALUATION   Patient Name: [name] MRN: [id number] DOB:08/01/1951, 72 y.o., female Today's Date: 02/13/2023  END OF SESSION:  PT End of Session - 02/13/23 1504     Visit Number 1    Date for PT Re-Evaluation --   one time visit   PT Start Time 1215    PT Stop Time 1245    PT Time Calculation (min) 30 min    Activity Tolerance Patient tolerated treatment well    Behavior During Therapy [name] for tasks assessed/performed             Past Medical History:  Diagnosis Date   Aortic stenosis    mild on 04/07/19 echo   Arthritis    L knee, hands, back    Asthma    Diabetes mellitus type 2, insulin dependent (HCC)    Fibromyalgia    Full dentures    Full dentures    GERD (gastroesophageal reflux disease)    H/O hiatal hernia    Headache    h/o migraines - was followed for a time with a wellness doctor   History of esophageal dilatation    History of rhabdomyolysis    03/ 2015   Hyperlipidemia    Hypertension    Insulin pump in place    since 12/ 2014--  [employer]   Lumbar disc herniation    right leg weakness   Moderate nonproliferative diabetic retinopathy (HCC) 04/03/2022   Osteoarthritis of left knee, primary localized 08/17/2014   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   Renal insufficiency    Rhabdomyolysis 11/15/2013   SUI (stress urinary incontinence, female)    Syncope 12/27/2017   Wears glasses    Wears glasses    Past Surgical History:  Procedure Laterality Date   BLADDER SUSPENSION N/A 03/09/2014   Procedure: CYSTOSCOPY/SLING;  Surgeon: Martina Sinner, MD;  Location: [facility];  Service: Urology;  Laterality: N/A;   CARDIAC CATHETERIZATION  07-20-2009   DR Nicki Guadalajara   MODERATE LVH/  NORMAL  LVEF/  NORMAL CORONARY AND RENAL ARTERIES   CARPAL TUNNEL RELEASE Right 2000   CHOLECYSTECTOMY  1990   COLONOSCOPY WITH ESOPHAGOGASTRODUODENOSCOPY (EGD)  06-18-2002   ESOPHAGOGASTRODUODENOSCOPY (EGD) WITH  ESOPHAGEAL DILATION  06-12-2010   EXCISION LEFT UPPER ARM MASS  01-29-2014   EYE SURGERY  2010   laser on R eye   INTERSTIM IMPLANT REMOVAL N/A 03/06/2019   Procedure: REMOVAL OF Leane Platt IMPLANT;  Surgeon: Alfredo Martinez, MD;  Location: [facility];  Service: Urology;  Laterality: N/A;   KNEE ARTHROSCOPY Right 1989  &  2002   LUMBAR LAMINECTOMY N/A 02/19/2020   Procedure: right L4 hemiaminectomy, microdiscectomy;  Surgeon: Eldred Manges, MD;  Location: [facility];  Service: Orthopedics;  Laterality: N/A;   MULTIPLE TOOTH EXTRACTIONS     NEGATIVE SLEEP STUDY  2012   PER PT   PARTIAL KNEE ARTHROPLASTY Left 08/17/2014   Procedure: LEFT UNICOMPARTMENTAL KNEE;  Surgeon: Eulas Post, MD;  Location: [facility];  Service: Orthopedics;  Laterality: Left;   Patient Active Problem List   Diagnosis Date Noted   Moderate nonproliferative diabetic retinopathy (HCC) 04/03/2022   Abdominal bloating 07/03/2021   Chronic idiopathic constipation 07/03/2021   Colon cancer screening 07/03/2021   Diverticulosis of large intestine without perforation or abscess without bleeding 07/03/2021   Dysphagia 07/03/2021   Flatulence, eructation and gas pain 07/03/2021   Hiatal hernia 07/03/2021   Quadriceps weakness 02/06/2021   Syncope 11/29/2020  Mild aortic stenosis 11/29/2020   Irritable bowel syndrome with diarrhea 10/10/2020   Gastroesophageal reflux disease with esophagitis 10/10/2020   Postlaminectomy syndrome, not elsewhere classified 07/03/2020   S/P lumbar laminectomy 03/30/2020   Localized swelling, mass and lump, multiple sites 03/27/2018   Type II diabetes mellitus with renal manifestations (HCC) 12/27/2017   HLD (hyperlipidemia) 12/27/2017   HTN (hypertension) 12/27/2017   CKD (chronic kidney disease), stage III (HCC) 12/27/2017   Cough variant asthma 12/01/2015   Osteoarthritis of left knee, primary localized 08/17/2014   Knee osteoarthritis 08/17/2014   Hypoglycemia 11/15/2013   Other and  unspecified hyperlipidemia 08/06/2013   Type II diabetes mellitus, uncontrolled 07/20/2013   Varicose veins of lower extremities with other complications 03/19/2013   Fibromyalgia    GERD 05/18/2010   ABDOMINAL PAIN, GENERALIZED 05/02/2010   OBESITY 04/20/2010   BURSITIS, LEFT SHOULDER 03/09/2010   Lipoma of arm s/p excision 01/29/2014 01/10/2010   DEPRESSION 12/27/2009   BACK PAIN WITH RADICULOPATHY 12/27/2009   CYSTITIS, ACUTE 10/21/2009   MICROSCOPIC HEMATURIA 10/18/2009   KNEE PAIN, BILATERAL 10/18/2009   NEUROPATHY 09/08/2009   Asthma 09/08/2009   STRESS INCONTINENCE 09/08/2009   CHEST PAIN 07/19/2009   ESOPHAGEAL STRICTURE 08/27/2005    PCP: Sharmon Revere, MD  REFERRING PROVIDER: [name], DO  REFERRING DIAG: R20.2 (ICD-10-CM) - Paresthesia of arm   THERAPY DIAG:  Cervicalgia  Rationale for Evaluation and Treatment: Rehabilitation  ONSET DATE: February 2023   SUBJECTIVE:                                                                                                                                                                                      SUBJECTIVE STATEMENT: Pt presents to PT with referral for neck and R UE pain/numbness. Of note she states that she is actually doing much better since seeing her doctor, noting very little pain and greatly improved ROM. "I feel 100 times better since the referral was sent."   Hand dominance: Right  PERTINENT HISTORY: HTN, DM, Fibromyalgia  PAIN:  Are you having pain?  Yes: NPRS scale: 2/10 Pain location: neck, R upper trap Worst: 4/10  Pain description: tight Aggravating factors: None Relieving factors: exercise  PRECAUTIONS: None  WEIGHT BEARING RESTRICTIONS: No  FALLS:  Has patient fallen in last 6 months? No  LIVING ENVIRONMENT: Lives with: lives alone Lives in: House/apartment  OCCUPATION: On disability   PLOF: Independent with basic ADLs  PATIENT GOALS: patient feels she is doing  better and does not require to be on PT caseload   OBJECTIVE:   DIAGNOSTIC FINDINGS:  See imaging   PATIENT SURVEYS :  N/A  COGNITION:  Overall cognitive status: Within functional limits for tasks assessed     SENSATION: WFL  POSTURE: Rounded shoulders, fwd head   CERVICAL ROM:   Active ROM Eval  Right rotation 73  Left rotation 75  (Blank rows = not tested)  UPPER EXTREMITY ROM:   Active ROM Right eval Left eval  Shoulder flexion [facility]  Shoulder extension    Shoulder abduction [facility]  Shoulder adduction    Shoulder internal rotation    Shoulder external rotation    Elbow flexion    Elbow extension    Wrist flexion    Wrist extension    Wrist ulnar deviation    Wrist radial deviation    Wrist pronation    Wrist supination    (Blank rows = not tested)  PALPATION:  TTP to left upper trap   TREATMENT: [facility] Adult PT Treatment:                                                DATE: 02/13/2023 Therapeutic Exercise: Seated scapular retractions Row x 5 GTB Supine chin tuck x 10  R upper stretch x 30"  PATIENT EDUCATION: Education details: eval findings, HEP Person educated: Patient Education method: Explanation, Demonstration, and Handouts Education comprehension: verbalized understanding and returned demonstration  HOME EXERCISE PROGRAM: Access Code: [email or web address] URL: [email or web address] Date: 02/13/2023 Prepared by: Edwinna Areola  Exercises - Seated Scapular Retraction  - 1 x daily - 7 x weekly - 2 sets - 10 reps - Scapular Retraction with Resistance  - 1 x daily - 7 x weekly - 3 sets - 10 reps - green band hold - Supine Chin Tuck  - 1 x daily - 7 x weekly - 2-3 sets - 10 reps - 3 sec hold - Seated Upper Trapezius Stretch  - 1 x daily - 7 x weekly - 2 reps - 30 sec hold  ASSESSMENT:  CLINICAL IMPRESSION: Patient is a 72 y.o. F who was seen today for physical therapy evaluation and treatment for onset of neck and R UE pain/numbness.  On evaluation she was doing very well, stating "I feel 100 times better since the referral was sent'. She does not feel she needs skilled therapy at this time after review of additional exercises to add to HEP. PT in agreement with plan for one time visit, pt should be able to maintain improvement with HEP compliance.   OBJECTIVE IMPAIRMENTS: decreased activity tolerance, decreased endurance, decreased strength, and impaired UE functional use   ACTIVITY LIMITATIONS: carrying and lifting  PARTICIPATION LIMITATIONS: driving, shopping, community activity, and yard work  PERSONAL FACTORS: 3+ comorbidities: HTN, DM, Fibromyalgia  are also affecting patient's functional outcome.   REHAB POTENTIAL: Excellent  CLINICAL DECISION MAKING: Stable/uncomplicated  EVALUATION COMPLEXITY: Low  GOALS: Goals reviewed with patient? No - one time treatment  PLAN: PT FREQUENCY: one time visit  PT DURATION: N/A  PLANNED INTERVENTIONS: Therapeutic exercises, Therapeutic activity, Neuromuscular re-education, Balance training, Gait training, Patient/Family education, Self Care, Joint mobilization, Dry Needling, Cryotherapy, Moist heat, Manual therapy, and Re-evaluation  PLAN FOR NEXT SESSION: N/A   Eloy End, PT 02/13/2023, 3:05 PM

## 2023-02-13 ENCOUNTER — Other Ambulatory Visit: Payer: Self-pay

## 2023-02-13 ENCOUNTER — Ambulatory Visit: Payer: 59 | Attending: Neurology

## 2023-02-13 DIAGNOSIS — R202 Paresthesia of skin: Secondary | ICD-10-CM | POA: Diagnosis not present

## 2023-02-13 DIAGNOSIS — M542 Cervicalgia: Secondary | ICD-10-CM | POA: Diagnosis present

## 2023-02-20 ENCOUNTER — Emergency Department (HOSPITAL_BASED_OUTPATIENT_CLINIC_OR_DEPARTMENT_OTHER)
Admission: EM | Admit: 2023-02-20 | Discharge: 2023-02-21 | Disposition: A | Discharge status: Home / Self Care | Payer: 59 | Attending: Emergency Medicine | Admitting: Emergency Medicine

## 2023-02-20 ENCOUNTER — Other Ambulatory Visit: Payer: Self-pay

## 2023-02-20 ENCOUNTER — Encounter (HOSPITAL_BASED_OUTPATIENT_CLINIC_OR_DEPARTMENT_OTHER): Payer: Self-pay

## 2023-02-20 DIAGNOSIS — Z7982 Long term (current) use of aspirin: Secondary | ICD-10-CM | POA: Diagnosis not present

## 2023-02-20 DIAGNOSIS — R300 Dysuria: Secondary | ICD-10-CM | POA: Diagnosis present

## 2023-02-20 DIAGNOSIS — N3000 Acute cystitis without hematuria: Secondary | ICD-10-CM | POA: Diagnosis not present

## 2023-02-20 DIAGNOSIS — R7309 Other abnormal glucose: Secondary | ICD-10-CM | POA: Diagnosis not present

## 2023-02-20 LAB — CBG MONITORING, ED: Glucose-Capillary: 173 mg/dL — ABNORMAL HIGH (ref 70–99)

## 2023-02-20 NOTE — ED Notes (Signed)
BGL 173 mg/dl

## 2023-02-20 NOTE — ED Triage Notes (Signed)
Ongoing dysuria for several months. Says she was seen at urologist office today for cystoscopy but they were  unable to complete due to " leaking bladder". Was started on Clotrimazole and Betamethasone toady.

## 2023-02-21 DIAGNOSIS — N3000 Acute cystitis without hematuria: Secondary | ICD-10-CM | POA: Diagnosis not present

## 2023-02-21 LAB — URINALYSIS, ROUTINE W REFLEX MICROSCOPIC
Bilirubin Urine: NEGATIVE
Glucose, UA: 500 mg/dL — AB
Ketones, ur: NEGATIVE mg/dL
Nitrite: NEGATIVE
Protein, ur: >300 mg/dL — AB
Specific Gravity, Urine: 1.021 (ref 1.005–1.030)
WBC, UA: >50 WBC/hpf (ref 0–5)
pH: 7 (ref 5.0–8.0)

## 2023-02-21 MED ORDER — CEPHALEXIN 500 MG PO CAPS: 500.0000 mg | ORAL_CAPSULE | Freq: Four times a day (QID) | ORAL | 0 refills | Status: DC

## 2023-02-21 MED ORDER — CEPHALEXIN 250 MG PO CAPS
500.0000 mg | ORAL_CAPSULE | Freq: Once | ORAL | Status: AC
  Administered 2023-02-21: 500 mg via ORAL
  Filled 2023-02-21: qty 2

## 2023-02-21 NOTE — ED Notes (Signed)
UA sample sent.

## 2023-02-21 NOTE — ED Provider Notes (Signed)
[place] EMERGENCY DEPARTMENT AT [facility] Provider Note   CSN: [id number] Arrival date & time: 02/20/23  2212     History  Chief Complaint  Patient presents with   Dysuria    CORTNEY BEISSEL is a 72 y.o. female.  The history is provided by the patient.  Dysuria Pain quality:  Burning Pain severity:  Moderate Onset quality:  Gradual Timing:  Constant Progression:  Unchanged Chronicity:  Chronic Recent urinary tract infections: yes   Relieved by:  Nothing Worsened by:  Nothing Ineffective treatments:  None tried Associated symptoms: no abdominal pain, no fever and no flank pain   Risk factors: no hx of pyelonephritis   Patient with ongoing UTI for 3.5 months presents having been seen by urology earlier in the day for same.  Was started on medication for yeast infection but not antibiotics.  No fevers,  no weakness.       Home Medications Prior to Admission medications   Medication Sig Start Date End Date Taking? Authorizing Provider  cephALEXin (KEFLEX) 500 MG capsule Take 1 capsule (500 mg total) by mouth 4 (four) times daily. 02/21/23  Yes Lynnda Wiersma, MD  amLODipine (NORVASC) 5 MG tablet Take 1 tablet (5 mg total) by mouth at bedtime. Patient taking differently: Take 10 mg by mouth at bedtime. Take 10 mg in the morning. 12/19/21   Arnette Felts, FNP  aspirin EC 81 MG EC tablet Take 1 tablet (81 mg total) by mouth daily. Swallow whole. 12/01/20   Regalado, Belkys A, MD  atorvastatin (LIPITOR) 10 MG tablet Take 1 tablet (10 mg total) by mouth daily. 12/29/21   Arnette Felts, FNP  blood glucose meter kit and supplies KIT Dispense based on patient and insurance preference. Check blood sugars 4 times daily E11.69 12/29/20   Arnette Felts, FNP  Continuous Blood Gluc Receiver (FREESTYLE LIBRE READER) DEVI 1 each by Does not apply route See admin instructions. CGM device-Freestyle [employer], Historical, MD  Continuous Blood Gluc Sensor (FREESTYLE LIBRE 14  DAY SENSOR) MISC 1 each by Does not apply route See admin instructions. CGM 14-day sensors; send refills to [employer] (in Epic)    [provider]  DULoxetine (CYMBALTA) 30 MG capsule Take 1 capsule (30 mg total) by mouth daily. 11/21/22   Patel, Donika K, DO  Finerenone (KERENDIA) 10 MG TABS Take 1 tablet by mouth daily. 01/19/22   Arnette Felts, FNP  HUMALOG 100 UNIT/ML injection Inject 0.1 mLs (10 Units total) into the skin 3 (three) times daily with meals. Patient taking differently: Inject 8 Units into the skin 3 (three) times daily with meals. Inject 8 units 3 times a day. Max daily dose of 40 units. 05/10/21   Arnette Felts, FNP  insulin degludec (TRESIBA FLEXTOUCH) 100 UNIT/ML FlexTouch Pen Inject 27 Units into the skin daily. Patient taking differently: Inject 35 Units into the skin daily. Inject 35 units subcutaneous route daily 08/28/21   Arnette Felts, FNP  Insulin Pen Needle (BD PEN NEEDLE MICRO U/F) 32G X 6 MM MISC DX:E11.65 USE TO CHECK BLOOD SUGARS THREE TIMES A DAY 05/12/20   Dorothyann Peng, MD  Lancets Misc. MISC 1 each by Does not apply route 4 (four) times daily -  with meals and at bedtime. 12/16/18   Arnette Felts, FNP  linaclotide [employer]) 145 MCG CAPS capsule Take 1 capsule (145 mcg total) by mouth daily before breakfast. 12/19/21   Arnette Felts, FNP  [facility] VERIO test strip USE TO  test blood sugar FOUR TIMES DAILY 02/15/21   Arnette Felts, FNP  OZEMPIC, 0.25 OR 0.5 MG/DOSE, 2 MG/3ML SOPN Inject 0.5 mg into the skin once a week.    [provider]  pantoprazole (PROTONIX) 40 MG tablet TAKE ONE TABLET BY MOUTH ONCE DAILY 12/25/21   Arnette Felts, FNP  pregabalin (LYRICA) 50 MG capsule TAKE ONE CAPSULE BY MOUTH BEFORE BREAKFAST and TAKE ONE CAPSULE BY MOUTH EVERY EVENING and TAKE ONE CAPSULE BY MOUTH EVERYDAY AT BEDTIME Patient taking differently: 100 mg. Take 1 capsule three times daily 10/25/21   Arnette Felts, FNP  valsartan (DIOVAN) 40 MG tablet Take 1 tablet  (40 mg total) by mouth daily. Patient taking differently: Take 160 mg by mouth daily. Take one tablet daily 04/18/22 11/21/22  Arnette Felts, FNP  Vibegron (GEMTESA) 75 MG TABS Take 75 mg by mouth daily.    [provider]      Allergies    Pollen extract, Tomato, and Codeine    Review of Systems   Review of Systems  Constitutional:  Negative for fever.  Gastrointestinal:  Negative for abdominal pain.  Genitourinary:  Positive for dysuria. Negative for flank pain.  All other systems reviewed and are negative.   Physical Exam Updated Vital Signs BP (!) 183/86   Pulse 79   Temp 98.3 F (36.8 C) (Oral)   Resp 16   Ht 5\' 1"  (1.549 m)   Wt 68.9 kg   LMP  (LMP Unknown)   SpO2 98%   BMI 28.72 kg/m  Physical Exam Vitals reviewed.  Constitutional:      General: She is not in acute distress.    Appearance: Normal appearance. She is well-developed.  HENT:     Head: Normocephalic and atraumatic.     Nose: Nose normal.  Eyes:     Pupils: Pupils are equal, round, and reactive to light.  Cardiovascular:     Rate and Rhythm: Normal rate and regular rhythm.     Pulses: Normal pulses.     Heart sounds: Normal heart sounds.  Pulmonary:     Effort: Pulmonary effort is normal. No respiratory distress.     Breath sounds: Normal breath sounds.  Abdominal:     General: Bowel sounds are normal. There is no distension.     Palpations: Abdomen is soft.     Tenderness: There is no abdominal tenderness. There is no guarding or rebound.  Genitourinary:    Vagina: No vaginal discharge.  Musculoskeletal:        General: Normal range of motion.     Cervical back: Neck supple.  Skin:    General: Skin is warm and dry.     Capillary Refill: Capillary refill takes less than 2 seconds.     Findings: No erythema or rash.  Neurological:     General: No focal deficit present.     Mental Status: She is alert and oriented to person, place, and time.     Deep Tendon Reflexes: Reflexes  normal.  Psychiatric:        Mood and Affect: Mood normal.     ED Results / Procedures / Treatments   Labs (all labs ordered are listed, but only abnormal results are displayed) Results for orders placed or performed during the hospital encounter of 02/20/23  Urinalysis, Routine w reflex microscopic -Urine, Clean Catch  Result Value Ref Range   Color, Urine YELLOW YELLOW   APPearance HAZY (A) CLEAR   Specific Gravity, Urine 1.021 1.005 -  1.030   pH 7.0 5.0 - 8.0   Glucose, UA 500 (A) NEGATIVE mg/dL   Hgb urine dipstick MODERATE (A) NEGATIVE   Bilirubin Urine NEGATIVE NEGATIVE   Ketones, ur NEGATIVE NEGATIVE mg/dL   Protein, ur >324 (A) NEGATIVE mg/dL   Nitrite NEGATIVE NEGATIVE   Leukocytes,Ua LARGE (A) NEGATIVE   RBC / HPF 21-50 0 - 5 RBC/hpf   WBC, UA >50 0 - 5 WBC/hpf   Bacteria, UA FEW (A) NONE SEEN   Squamous Epithelial / HPF 21-50 0 - 5 /HPF   WBC Clumps PRESENT    Mucus PRESENT    Hyaline Casts, UA PRESENT   POC CBG, ED  Result Value Ref Range   Glucose-Capillary 173 (H) 70 - 99 mg/dL   No results found.  Radiology No results found.  Procedures Procedures    Medications Ordered in ED Medications  cephALEXin (KEFLEX) capsule 500 mg (has no administration in time range)    ED Course/ Medical Decision Making/ A&P                             Medical Decision Making Patient with ongoing dysuria for 3.5 months   Amount and/or Complexity of Data Reviewed Independent Historian:     Details: Daughter see above  External Data Reviewed: notes.    Details: Previous notes reviewed  Labs: ordered.    Details: Urine is consistent with UTI, culture sent.  Glucose 173 consistent with previous   Risk Prescription drug management. Risk Details: Recurrent vs.  Chronic UTI.  Urine culture sent.  Keflex initiated.  Follow up with your PMD and urology for ongoing care.  Stable for discharge.      Final Clinical Impression(s) / ED Diagnoses Final diagnoses:   Acute cystitis without hematuria   Return for intractable cough, coughing up blood, fevers > 100.4 unrelieved by medication, shortness of breath, intractable vomiting, chest pain, shortness of breath, weakness, numbness, changes in speech, facial asymmetry, abdominal pain, passing out, Inability to tolerate liquids or food, cough, altered mental status or any concerns. No signs of systemic illness or infection. The patient is nontoxic-appearing on exam and vital signs are within normal limits.  I have reviewed the triage vital signs and the nursing notes. Pertinent labs & imaging results that were available during my care of the patient were reviewed by me and considered in my medical decision making (see chart for details). After history, exam, and medical workup I feel the patient has been appropriately medically screened and is safe for discharge home. Pertinent diagnoses were discussed with the patient. Patient was given return precautions.  Rx / DC Orders ED Discharge Orders          Ordered    cephALEXin (KEFLEX) 500 MG capsule  4 times daily        02/21/23 0047              Tova Vater, MD 02/21/23 4010

## 2023-02-21 NOTE — ED Notes (Signed)
Pt verbalized understanding of d/c instructions, meds, and followup care. Denies questions. VSS, no distress noted. Steady gait to exit with all belongings.  ?

## 2023-02-22 LAB — URINE CULTURE
Culture: <10000 — AB
Special Requests: NORMAL

## 2023-03-19 ENCOUNTER — Ambulatory Visit: Payer: 59 | Admitting: Neurology

## 2023-04-29 ENCOUNTER — Other Ambulatory Visit: Payer: Self-pay | Admitting: Neurology

## 2023-05-02 ENCOUNTER — Other Ambulatory Visit: Payer: Self-pay | Admitting: Neurology

## 2023-06-30 ENCOUNTER — Other Ambulatory Visit: Payer: Self-pay | Admitting: Neurology

## 2023-07-09 ENCOUNTER — Encounter: Payer: Self-pay | Admitting: Neurology

## 2023-07-09 ENCOUNTER — Ambulatory Visit: Payer: 59 | Admitting: Neurology

## 2023-07-29 ENCOUNTER — Other Ambulatory Visit: Payer: Self-pay | Admitting: Neurology

## 2023-09-18 ENCOUNTER — Other Ambulatory Visit: Payer: Self-pay | Admitting: Family Medicine

## 2023-09-18 DIAGNOSIS — Z1231 Encounter for screening mammogram for malignant neoplasm of breast: Secondary | ICD-10-CM

## 2023-11-10 ENCOUNTER — Emergency Department (HOSPITAL_BASED_OUTPATIENT_CLINIC_OR_DEPARTMENT_OTHER)
Admission: EM | Admit: 2023-11-10 | Discharge: 2023-11-10 | Disposition: A | Discharge status: Home / Self Care | Attending: Emergency Medicine | Admitting: Emergency Medicine

## 2023-11-10 ENCOUNTER — Encounter (HOSPITAL_BASED_OUTPATIENT_CLINIC_OR_DEPARTMENT_OTHER): Payer: Self-pay | Admitting: Emergency Medicine

## 2023-11-10 ENCOUNTER — Other Ambulatory Visit: Payer: Self-pay

## 2023-11-10 ENCOUNTER — Emergency Department (HOSPITAL_BASED_OUTPATIENT_CLINIC_OR_DEPARTMENT_OTHER)

## 2023-11-10 DIAGNOSIS — M25511 Pain in right shoulder: Secondary | ICD-10-CM | POA: Insufficient documentation

## 2023-11-10 DIAGNOSIS — W1839XA Other fall on same level, initial encounter: Secondary | ICD-10-CM | POA: Diagnosis not present

## 2023-11-10 DIAGNOSIS — W19XXXA Unspecified fall, initial encounter: Secondary | ICD-10-CM

## 2023-11-10 MED ORDER — ACETAMINOPHEN 500 MG PO TABS
1000.0000 mg | ORAL_TABLET | Freq: Once | ORAL | Status: AC
  Administered 2023-11-10: 1000 mg via ORAL
  Filled 2023-11-10: qty 2

## 2023-11-10 NOTE — ED Provider Notes (Signed)
 [place] EMERGENCY DEPARTMENT AT [facility] Provider Note   CSN: [id number] Arrival date & time: 11/10/23  0347     History Chief Complaint  Patient presents with   Fall    HPI [name] is a 73 y.o. female presenting for ground-level fall. States that she was getting up to the bathroom last night lost her balance and fell onto her right side.  Has substantial pain at her right shoulder but otherwise in no acute distress.  Denies fevers chills nausea vomiting shortness of breath. Initially reported some pain in her left foot but states started got better with ice in the lobby..   Patient's recorded medical, surgical, social, medication list and allergies were reviewed in the Snapshot window as part of the initial history.   Review of Systems   Review of Systems  Constitutional:  Negative for chills and fever.  HENT:  Negative for ear pain and sore throat.   Eyes:  Negative for pain and visual disturbance.  Respiratory:  Negative for cough and shortness of breath.   Cardiovascular:  Negative for chest pain and palpitations.  Gastrointestinal:  Negative for abdominal pain and vomiting.  Genitourinary:  Negative for dysuria and hematuria.  Musculoskeletal:  Negative for arthralgias and back pain.  Skin:  Negative for color change and rash.  Neurological:  Negative for seizures and syncope.  All other systems reviewed and are negative.   Physical Exam Updated Vital Signs BP (!) 173/85   Pulse 84   Temp 98.1 F (36.7 C) (Oral)   Resp 15   Ht 5\' 4"  (1.626 m)   Wt 65.8 kg   LMP  (LMP Unknown)   SpO2 99%   BMI 24.89 kg/m  Physical Exam Vitals and nursing note reviewed.  Constitutional:      General: She is not in acute distress.    Appearance: She is well-developed.  HENT:     Head: Normocephalic and atraumatic.  Eyes:     Conjunctiva/sclera: Conjunctivae normal.  Cardiovascular:     Rate and Rhythm: Normal rate and regular rhythm.      Heart sounds: No murmur heard. Pulmonary:     Effort: Pulmonary effort is normal. No respiratory distress.     Breath sounds: Normal breath sounds.  Abdominal:     General: There is no distension.     Palpations: Abdomen is soft.     Tenderness: There is no abdominal tenderness. There is no right CVA tenderness or left CVA tenderness.  Musculoskeletal:        General: No swelling or tenderness. Normal range of motion.     Cervical back: Neck supple.  Skin:    General: Skin is warm and dry.  Neurological:     General: No focal deficit present.     Mental Status: She is alert and oriented to person, place, and time. Mental status is at baseline.     Cranial Nerves: No cranial nerve deficit.      ED Course/ Medical Decision Making/ A&P    Procedures Procedures   Medications Ordered in ED Medications  acetaminophen (TYLENOL) tablet 1,000 mg (1,000 mg Oral Given 11/10/23 2206)   Medical Decision Making:    [name] is a 73 y.o. female who presented to the ED today with a moderate mechanisma trauma, detailed above.    Additional history discussed with patient's family/caregivers.  Patient placed on continuous vitals and telemetry monitoring while in ED which was reviewed periodically.  Given this mechanism of trauma, a full physical exam was performed. Notably, patient was HDS in NAD.   Reviewed and confirmed nursing documentation for past medical history, family history, social history.    Initial Assessment/Plan:   This is a patient presenting with a moderate mechanism trauma.  As such, I have considered intracranial injuries including intracranial hemorrhage, intrathoracic injuries including blunt myocardial or blunt lung injury, blunt abdominal injuries including aortic dissection, bladder injury, spleen injury, liver injury and I have considered orthopedic injuries including extremity or spinal injury.  With the patient's presentation of moderate mechanism trauma  but an otherwise reassuring exam, patient warrants targeted evaluation for potential traumatic injuries. Will proceed with targeted evaluation for potential injuries. Will proceed with right shoulder x-ray, right rib series, left foot x-ray. Objective evaluation resulted with no acute injury  Final Reassessment and Plan:   Patient does have mild pain at her right [facility] joint. Will place in a sling for comfort as she might have a mild grade 1 separation there.  Recommend she follow-up with orthopedics if continues to cause limitations in range of motion for possible rotator cuff injury. Fortunately she is overall well-appearing ambulatory in no acute distress.  She feels comfortable outpatient care and management.   Disposition:  I have considered need for hospitalization, however, considering all of the above, I believe this patient is stable for discharge at this time.  Patient/family educated about specific return precautions for given chief complaint and symptoms.  Patient/family educated about follow-up with PCP.     Patient/family expressed understanding of return precautions and need for follow-up. Patient spoken to regarding all imaging and laboratory results and appropriate follow up for these results. All education provided in verbal form with additional information in written form. Time was allowed for answering of patient questions. Patient discharged.    Emergency Department Medication Summary:   Medications  acetaminophen (TYLENOL) tablet 1,000 mg (1,000 mg Oral Given 11/10/23 2206)          Clinical Impression:  1. Fall, initial encounter      Discharge   Final Clinical Impression(s) / ED Diagnoses Final diagnoses:  Fall, initial encounter    Rx / DC Orders ED Discharge Orders     None         Glyn Ade, MD 11/10/23 2240

## 2023-11-10 NOTE — ED Triage Notes (Signed)
 Mechanical fall last night resulting in right shoulder, right rib and left foot injury.

## 2023-11-14 ENCOUNTER — Ambulatory Visit (INDEPENDENT_AMBULATORY_CARE_PROVIDER_SITE_OTHER): Admitting: Surgical

## 2023-11-14 ENCOUNTER — Other Ambulatory Visit (INDEPENDENT_AMBULATORY_CARE_PROVIDER_SITE_OTHER): Payer: Self-pay

## 2023-11-14 DIAGNOSIS — G8929 Other chronic pain: Secondary | ICD-10-CM

## 2023-11-14 DIAGNOSIS — M25511 Pain in right shoulder: Secondary | ICD-10-CM

## 2023-11-14 MED ORDER — HYDROCODONE-ACETAMINOPHEN 5-325 MG PO TABS: 1.0000 | ORAL_TABLET | Freq: Every evening | ORAL | 0 refills | Status: DC | PRN

## 2023-11-15 ENCOUNTER — Encounter: Payer: Self-pay | Admitting: Surgical

## 2023-11-15 NOTE — Progress Notes (Signed)
 Office Visit Note   Patient: SUSANNE BAUMGARNER           Date of Birth: 10/13/1950           MRN: [id number] Visit Date: 11/14/2023 Requested by: Sharmon Revere, MD [address] [place],  [place] [address] PCP: Sharmon Revere, MD  Subjective: Chief Complaint  Patient presents with   Right Shoulder - Pain    Fall 11/09/2023   right rib pain    Fall 11/09/2023    HPI: ALLYANNA APPLEMAN is a 73 y.o. female who presents to the office reporting right shoulder pain.  She sustained a fall on 11/09/2023.  Fell onto her right side.  Has no history of prior difficulties with her right shoulder but ever since the fall she has severe right shoulder pain that wakes her up from sleep at night and significant shoulder dysfunction.  She cannot really lift the arm up by herself without assistance.  Even with assistance, it is incredibly painful for her.  She has pain primarily around the shoulder region diffusely with some trapezius pain and some right sided rib pain as well.  She denies any significant deformity.  No radicular pain down the arm.  No prior history of right shoulder surgery.  She is currently retired and does not do anything significantly active like sports or exercise but she has been to the gym in the past for swimming and is considering doing this again in the future at some point.  She has history of uncontrolled diabetes but she is working to get it better controlled.  Last A1c was somewhere around 11.7 two months ago.                ROS: All systems reviewed are negative as they relate to the chief complaint within the history of present illness.  Patient denies fevers or chills.  Assessment & Plan: Visit Diagnoses:  1. Chronic right shoulder pain     Plan: Patient is a 73 year old female who presents for evaluation of right shoulder pain ever since a fall about 4 to 5 days ago.  She has never really had issues with this shoulder before but now she has gotten to the point  after this fall that she really cannot lift the arm up by itself.  She had radiographs demonstrating no fracture or dislocation as performed in the ER (along with the rib radiographs demonstrating no rib fracture).  Got 1 additional view today demonstrating no glenohumeral dislocation since no axillary was done in the emergency department.  With significant fall and intractable pain at this point as well as difficulty with any active motion of her shoulder, need MRI arthrogram for further evaluation of rotator cuff tear.  Also counseled patient to try and continue taking as deep breaths as possible to avoid shallow breathing and development of pneumonia.  Follow-up after MRI to review results.  Follow-Up Instructions: No follow-ups on file.   Orders:  Orders Placed This Encounter  Procedures   XR Shoulder 1V Right   DL FLUORO GUIDED NEEDLE PLC ASPIRATION / INJECTTION/LOC   MR SHOULDER RIGHT W CONTRAST   Meds ordered this encounter  Medications   HYDROcodone-acetaminophen (NORCO/VICODIN) 5-325 MG tablet    Sig: Take 1 tablet by mouth at bedtime as needed for moderate pain (pain score 4-6).    Dispense:  14 tablet    Refill:  0      Procedures: No procedures performed   Clinical Data: No additional  findings.  Objective: Vital Signs: LMP  (LMP Unknown)   Physical Exam:  Constitutional: Patient appears well-developed HEENT:  Head: Normocephalic Eyes:EOM are normal Neck: Normal range of motion Cardiovascular: Normal rate Pulmonary/chest: Effort normal Neurologic: Patient is alert Skin: Skin is warm Psychiatric: Patient has normal mood and affect  Ortho Exam: Ortho exam demonstrates right shoulder with significant limited passive range of motion and active range of motion due to patient's pain and guarding.  She has active motion to about 20 to 30 degrees of active forward elevation and similar amount of abduction.  Axillary nerve is intact with deltoid firing.  There is no  substantial deformity noted around the shoulder but the anterior humeral head seems a little bit more prominent on the right shoulder relative to the left.  She has tenderness diffusely through the anterolateral deltoid region.  Sensation intact over the lateral deltoid with equal sensation right versus left.  Intact EPL, FPL, finger abduction, pronation/supination, grip strength testing, bicep, tricep, deltoid.  Isolating rotator cuff strength demonstrates decent external rotation strength rated 5 -/5.  Internal rotation strength is rated 5 -/5 as well but isolating subscapularis strength is very difficult due to patient's guarding with any passive motion of her arm.  She has some diffuse tenderness throughout some of her ribs without any step-off palpable.  Specialty Comments:  No specialty comments available.  Imaging: No results found.   PMFS History: Patient Active Problem List   Diagnosis Date Noted   Moderate nonproliferative diabetic retinopathy (HCC) 04/03/2022   Abdominal bloating 07/03/2021   Chronic idiopathic constipation 07/03/2021   Colon cancer screening 07/03/2021   Diverticulosis of large intestine without perforation or abscess without bleeding 07/03/2021   Dysphagia 07/03/2021   Flatulence, eructation and gas pain 07/03/2021   Hiatal hernia 07/03/2021   Quadriceps weakness 02/06/2021   Syncope 11/29/2020   Mild aortic stenosis 11/29/2020   Irritable bowel syndrome with diarrhea 10/10/2020   Gastroesophageal reflux disease with esophagitis 10/10/2020   Postlaminectomy syndrome, not elsewhere classified 07/03/2020   S/P lumbar laminectomy 03/30/2020   Localized swelling, mass and lump, multiple sites 03/27/2018   Type II diabetes mellitus with renal manifestations (HCC) 12/27/2017   HLD (hyperlipidemia) 12/27/2017   HTN (hypertension) 12/27/2017   CKD (chronic kidney disease), stage III (HCC) 12/27/2017   Cough variant asthma 12/01/2015   Osteoarthritis of left  knee, primary localized 08/17/2014   Knee osteoarthritis 08/17/2014   Hypoglycemia 11/15/2013   Other and unspecified hyperlipidemia 08/06/2013   Type II diabetes mellitus, uncontrolled 07/20/2013   Varicose veins of bilateral lower extremities with other complications 03/19/2013   Fibromyalgia    GERD 05/18/2010   ABDOMINAL PAIN, GENERALIZED 05/02/2010   OBESITY 04/20/2010   BURSITIS, LEFT SHOULDER 03/09/2010   Lipoma of arm s/p excision 01/29/2014 01/10/2010   DEPRESSION 12/27/2009   BACK PAIN WITH RADICULOPATHY 12/27/2009   CYSTITIS, ACUTE 10/21/2009   MICROSCOPIC HEMATURIA 10/18/2009   KNEE PAIN, BILATERAL 10/18/2009   NEUROPATHY 09/08/2009   Asthma 09/08/2009   STRESS INCONTINENCE 09/08/2009   CHEST PAIN 07/19/2009   ESOPHAGEAL STRICTURE 08/27/2005   Past Medical History:  Diagnosis Date   Aortic stenosis    mild on 04/07/19 echo   Arthritis    L knee, hands, back    Asthma    Diabetes mellitus type 2, insulin dependent (HCC)    Fibromyalgia    Full dentures    Full dentures    GERD (gastroesophageal reflux disease)    H/O hiatal hernia  Headache    h/o migraines - was followed for a time with a wellness doctor   History of esophageal dilatation    History of rhabdomyolysis    03/ 2015   Hyperlipidemia    Hypertension    Insulin pump in place    since 12/ 2014--  [employer]   Lumbar disc herniation    right leg weakness   Moderate nonproliferative diabetic retinopathy (HCC) 04/03/2022   Osteoarthritis of left knee, primary localized 08/17/2014   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   Renal insufficiency    Rhabdomyolysis 11/15/2013   SUI (stress urinary incontinence, female)    Syncope 12/27/2017   Wears glasses    Wears glasses     Family History  Problem Relation Age of Onset   Diabetes Mother    Hypertension Mother    Lung cancer Mother        smoked   Diabetes Father    Hypertension Father    Lung cancer Father        smoked   Heart failure Sister    Diabetes  Brother    Diabetes Maternal Grandmother    Cancer Maternal Grandmother     Past Surgical History:  Procedure Laterality Date   BLADDER SUSPENSION N/A 03/09/2014   Procedure: CYSTOSCOPY/SLING;  Surgeon: Martina Sinner, MD;  Location: [facility] [place];  Service: Urology;  Laterality: N/A;   CARDIAC CATHETERIZATION  07-20-2009   DR Nicki [place]   MODERATE LVH/  NORMAL  LVEF/  NORMAL CORONARY AND RENAL ARTERIES   CARPAL TUNNEL RELEASE Right 2000   CHOLECYSTECTOMY  1990   COLONOSCOPY WITH ESOPHAGOGASTRODUODENOSCOPY (EGD)  06-18-2002   ESOPHAGOGASTRODUODENOSCOPY (EGD) WITH ESOPHAGEAL DILATION  06-12-2010   EXCISION LEFT UPPER ARM MASS  01-29-2014   EYE SURGERY  2010   laser on R eye   INTERSTIM IMPLANT REMOVAL N/A 03/06/2019   Procedure: REMOVAL OF Leane Platt IMPLANT;  Surgeon: Alfredo Martinez, MD;  Location: [facility];  Service: Urology;  Laterality: N/A;   KNEE ARTHROSCOPY Right 1989  &  2002   LUMBAR LAMINECTOMY N/A 02/19/2020   Procedure: right L4 hemiaminectomy, microdiscectomy;  Surgeon: Eldred Manges, MD;  Location: [facility] OR;  Service: Orthopedics;  Laterality: N/A;   MULTIPLE TOOTH EXTRACTIONS     NEGATIVE SLEEP STUDY  2012   PER PT   PARTIAL KNEE ARTHROPLASTY Left 08/17/2014   Procedure: LEFT UNICOMPARTMENTAL KNEE;  Surgeon: Eulas Post, MD;  Location: [facility] OR;  Service: Orthopedics;  Laterality: Left;   Social History   Occupational History   Occupation: disability  Tobacco Use   Smoking status: Never   Smokeless tobacco: Never  Vaping Use   Vaping status: Never Used  Substance and Sexual Activity   Alcohol use: No   Drug use: No   Sexual activity: Not Currently

## 2023-11-25 ENCOUNTER — Ambulatory Visit
Admission: RE | Admit: 2023-11-25 | Discharge: 2023-11-25 | Disposition: A | Discharge status: Home / Self Care | Payer: 59 | Source: Ambulatory Visit | Attending: Family Medicine | Admitting: Family Medicine

## 2023-11-25 DIAGNOSIS — Z1231 Encounter for screening mammogram for malignant neoplasm of breast: Secondary | ICD-10-CM

## 2023-12-03 ENCOUNTER — Ambulatory Visit
Admission: RE | Admit: 2023-12-03 | Discharge: 2023-12-03 | Disposition: A | Discharge status: Home / Self Care | Source: Ambulatory Visit | Attending: Surgical | Admitting: Surgical

## 2023-12-03 ENCOUNTER — Ambulatory Visit: Payer: 59 | Admitting: Neurology

## 2023-12-03 DIAGNOSIS — G8929 Other chronic pain: Secondary | ICD-10-CM

## 2023-12-03 MED ORDER — IOPAMIDOL (ISOVUE-M 200) INJECTION 41%
10.0000 mL | Freq: Once | INTRAMUSCULAR | Status: AC
  Administered 2023-12-03: 10 mL via INTRA_ARTICULAR

## 2023-12-16 ENCOUNTER — Encounter: Payer: Self-pay | Admitting: Surgical

## 2023-12-16 ENCOUNTER — Ambulatory Visit (INDEPENDENT_AMBULATORY_CARE_PROVIDER_SITE_OTHER): Admitting: Surgical

## 2023-12-16 DIAGNOSIS — M25511 Pain in right shoulder: Secondary | ICD-10-CM | POA: Diagnosis not present

## 2023-12-16 DIAGNOSIS — E1121 Type 2 diabetes mellitus with diabetic nephropathy: Secondary | ICD-10-CM | POA: Diagnosis not present

## 2023-12-16 DIAGNOSIS — G8929 Other chronic pain: Secondary | ICD-10-CM

## 2023-12-16 LAB — POCT GLYCOSYLATED HEMOGLOBIN (HGB A1C): Hemoglobin A1C: 9.8 % — AB (ref 4.0–5.6)

## 2023-12-16 MED ORDER — TRAMADOL HCL 50 MG PO TABS: 50.0000 mg | ORAL_TABLET | Freq: Two times a day (BID) | ORAL | 0 refills | Status: DC | PRN

## 2023-12-16 NOTE — Progress Notes (Signed)
 Office Visit Note   Patient: [name]           Date of Birth: [birth year]           MRN: [id number] Visit Date: 12/16/2023 Requested by: Lorella Roles, MD [address] [place],  [place] [address] PCP: Lorella Roles, MD  Subjective: No chief complaint on file.   HPI: [name] is a 73 y.o. female who presents to the office for MRI review. Patient denies any changes in symptoms.  Continues to complain mainly of right shoulder pain.  Still has difficulty lifting her arm actively compared with the other side.  Did not really have any issues until the fall prior to her last visit.  She states that pain is mildly improved compared with last visit.  MRI results revealed: MR SHOULDER RIGHT W CONTRAST Result Date: 12/03/2023 CLINICAL DATA:  Right shoulder pain for 8 weeks. No acute injury or prior relevant surgery. EXAM: MR ARTHROGRAM OF THE RIGHT SHOULDER TECHNIQUE: Multiplanar, multisequence MR imaging of the right shoulder was performed following the administration of intra-articular contrast. CONTRAST:  See Injection Documentation. COMPARISON:  Injection images same date. Radiographs 11/14/2023 and 11/10/2023. FINDINGS: Rotator cuff: Large full-thickness, non insertional tear of the supraspinatus tendon in the critical zone with approximately 1.3 cm of tendon retraction. This tear measures approximately 1.3 cm AP. There is an additional small full-thickness insertional tear. Mild infraspinatus tendinosis without tear. The subscapularis and teres minor tendons appear normal. Muscles: Mild supraspinatus muscular edema. No focal rotator cuff muscular atrophy. Biceps long head:  Intact and normally positioned. Acromioclavicular Joint: The acromion is type 2. There are mild acromioclavicular degenerative changes. A moderate amount of contrast extends from the shoulder joint through the full-thickness rotator cuff tear into the subacromial-subdeltoid bursa. Glenohumeral Joint:  The shoulder joint is well distended with contrast. No significant glenohumeral arthropathy. Labrum: No evidence of labral tear or paralabral cyst. Bones: No acute or significant extra-articular osseous findings.Mild subcortical cyst formation in the greater tuberosity near the supraspinatus and infraspinatus insertions. Other: No significant soft tissue findings. IMPRESSION: 1. Large full-thickness, non insertional tear of the supraspinatus tendon in the critical zone with approximately 1.3 cm of tendon retraction. Additional small full-thickness insertional tear of the supraspinatus tendon. 2. Mild infraspinatus tendinosis without tear. 3. The additional components of the rotator cuff, biceps tendon and labrum appear intact. 4. No acute osseous findings. Electronically Signed   By: Elmon Hagedorn M.D.   On: 12/03/2023 16:48                 ROS: All systems reviewed are negative as they relate to the chief complaint within the history of present illness.  Patient denies fevers or chills.  Assessment & Plan: Visit Diagnoses:  1. Chronic right shoulder pain   2. Type 2 diabetes mellitus with diabetic nephropathy, unspecified whether long term insulin  use (HCC)     Plan: [name] is a 73 y.o. female who presents to the office for review of right shoulder MRI.  MRI demonstrates full-thickness supraspinatus tear with tendon retraction and no significant atrophy.  With her substantial weakness and difficulty with active motion as well as the lack of glenohumeral arthritis, think that the best thing long-term for patient's shoulder function would be rotator cuff tear repair.  This is a significant source of dysfunction and pain for her.  Unfortunately, she has difficulty with controlling her diabetes with last A1c 11.7 in December.  We rechecked this today  and it was found to be 9.8 which is overall better but still not at the point where we can consider surgical intervention.  Need A1c to be  around 8.0.  She will reach out to her endocrinologist Dr. Jenell Mirza and see if they can alter her insulin  regimen or try an adjunct medication to better control her diabetes.  In the meantime, plan for her to follow-up with Dr. Rozelle Corning in about 4 to 6 weeks for clinical recheck and consideration of new point-of-care A1c at that time.  Right now the tendon is retracted but it looks fixable.  Patient understands that this will eventually progress to an unfixable rotator cuff tear if we do not address it.  Follow-Up Instructions: No follow-ups on file.   Orders:  No orders of the defined types were placed in this encounter.  No orders of the defined types were placed in this encounter.     Procedures: No procedures performed   Clinical Data: No additional findings.  Objective: Vital Signs: LMP  (LMP Unknown)   Physical Exam:  Constitutional: Patient appears well-developed HEENT:  Head: Normocephalic Eyes:EOM are normal Neck: Normal range of motion Cardiovascular: Normal rate Pulmonary/chest: Effort normal Neurologic: Patient is alert Skin: Skin is warm Psychiatric: Patient has normal mood and affect  Ortho Exam: Ortho exam demonstrates right shoulder with 30 degrees X rotation, 70 degrees abduction, 90 degrees forward elevation passively and actively before pain kicks in and patient resist severely.  There is some grinding and crepitus noted with passive motion of the shoulder consistent with rotator cuff pathology.  Axillary nerve intact with deltoid firing.  Intact EPL, FPL, finger abduction, pronation/supination, bicep, tricep, deltoid.  Supraspinatus strength rated 4/5 relative to contralateral side.  She has external rotation strength rated 5 -/5 compared with contralateral side.  Subscap strength rated 5/5.  She has active abduction to about 30 degrees and active forward elevation to about 40 degrees.  Specialty Comments:  No specialty comments available.  Imaging: No results  found.   PMFS History: Patient Active Problem List   Diagnosis Date Noted   Moderate nonproliferative diabetic retinopathy (HCC) 04/03/2022   Abdominal bloating 07/03/2021   Chronic idiopathic constipation 07/03/2021   Colon cancer screening 07/03/2021   Diverticulosis of large intestine without perforation or abscess without bleeding 07/03/2021   Dysphagia 07/03/2021   Flatulence, eructation and gas pain 07/03/2021   Hiatal hernia 07/03/2021   Quadriceps weakness 02/06/2021   Syncope 11/29/2020   Mild aortic stenosis 11/29/2020   Irritable bowel syndrome with diarrhea 10/10/2020   Gastroesophageal reflux disease with esophagitis 10/10/2020   Postlaminectomy syndrome, not elsewhere classified 07/03/2020   S/P lumbar laminectomy 03/30/2020   Localized swelling, mass and lump, multiple sites 03/27/2018   Type II diabetes mellitus with renal manifestations (HCC) 12/27/2017   HLD (hyperlipidemia) 12/27/2017   HTN (hypertension) 12/27/2017   CKD (chronic kidney disease), stage III (HCC) 12/27/2017   Cough variant asthma 12/01/2015   Osteoarthritis of left knee, primary localized 08/17/2014   Knee osteoarthritis 08/17/2014   Hypoglycemia 11/15/2013   Other and unspecified hyperlipidemia 08/06/2013   Type II diabetes mellitus, uncontrolled 07/20/2013   Varicose veins of bilateral lower extremities with other complications 03/19/2013   Fibromyalgia    GERD 05/18/2010   ABDOMINAL PAIN, GENERALIZED 05/02/2010   OBESITY 04/20/2010   BURSITIS, LEFT SHOULDER 03/09/2010   Lipoma of arm s/p excision 01/29/2014 01/10/2010   DEPRESSION 12/27/2009   BACK PAIN WITH RADICULOPATHY 12/27/2009   CYSTITIS, ACUTE 10/21/2009  MICROSCOPIC HEMATURIA 10/18/2009   KNEE PAIN, BILATERAL 10/18/2009   NEUROPATHY 09/08/2009   Asthma 09/08/2009   STRESS INCONTINENCE 09/08/2009   CHEST PAIN 07/19/2009   ESOPHAGEAL STRICTURE 08/27/2005   Past Medical History:  Diagnosis Date   Aortic stenosis    mild  on 04/07/19 echo   Arthritis    L knee, hands, back    Asthma    Diabetes mellitus type 2, insulin  dependent (HCC)    Fibromyalgia    Full dentures    Full dentures    GERD (gastroesophageal reflux disease)    H/O hiatal hernia    Headache    h/o migraines - was followed for a time with a wellness doctor   History of esophageal dilatation    History of rhabdomyolysis    03/ 2015   Hyperlipidemia    Hypertension    Insulin  pump in place    since 12/ 2014--  [employer]   Lumbar disc herniation    right leg weakness   Moderate nonproliferative diabetic retinopathy (HCC) 04/03/2022   Osteoarthritis of left knee, primary localized 08/17/2014   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   Renal insufficiency    Rhabdomyolysis 11/15/2013   SUI (stress urinary incontinence, female)    Syncope 12/27/2017   Wears glasses    Wears glasses     Family History  Problem Relation Age of Onset   Diabetes Mother    Hypertension Mother    Lung cancer Mother        smoked   Diabetes Father    Hypertension Father    Lung cancer Father        smoked   Heart failure Sister    Diabetes Brother    Diabetes Maternal Grandmother    Cancer Maternal Grandmother     Past Surgical History:  Procedure Laterality Date   BLADDER SUSPENSION N/A 03/09/2014   Procedure: CYSTOSCOPY/SLING;  Surgeon: Devorah Fonder, MD;  Location: [facility] [place];  Service: Urology;  Laterality: N/A;   CARDIAC CATHETERIZATION  07-20-2009   DR Magnus Schuller   MODERATE LVH/  NORMAL  LVEF/  NORMAL CORONARY AND RENAL ARTERIES   CARPAL TUNNEL RELEASE Right 2000   CHOLECYSTECTOMY  1990   COLONOSCOPY WITH ESOPHAGOGASTRODUODENOSCOPY (EGD)  06-18-2002   ESOPHAGOGASTRODUODENOSCOPY (EGD) WITH ESOPHAGEAL DILATION  06-12-2010   EXCISION LEFT UPPER ARM MASS  01-29-2014   EYE SURGERY  2010   laser on R eye   INTERSTIM IMPLANT REMOVAL N/A 03/06/2019   Procedure: REMOVAL OF Simona Dublin IMPLANT;  Surgeon: Erman Hayward, MD;  Location: [facility];   Service: Urology;  Laterality: N/A;   KNEE ARTHROSCOPY Right 1989  &  2002   LUMBAR LAMINECTOMY N/A 02/19/2020   Procedure: right L4 hemiaminectomy, microdiscectomy;  Surgeon: Adah Acron, MD;  Location: [facility] OR;  Service: Orthopedics;  Laterality: N/A;   MULTIPLE TOOTH EXTRACTIONS     NEGATIVE SLEEP STUDY  2012   PER PT   PARTIAL KNEE ARTHROPLASTY Left 08/17/2014   Procedure: LEFT UNICOMPARTMENTAL KNEE;  Surgeon: Neville Barbone, MD;  Location: [facility] OR;  Service: Orthopedics;  Laterality: Left;   Social History   Occupational History   Occupation: disability  Tobacco Use   Smoking status: Never   Smokeless tobacco: Never  Vaping Use   Vaping status: Never Used  Substance and Sexual Activity   Alcohol use: No   Drug use: No   Sexual activity: Not Currently

## 2024-01-23 ENCOUNTER — Encounter: Payer: Self-pay | Admitting: Orthopedic Surgery

## 2024-01-23 ENCOUNTER — Ambulatory Visit (INDEPENDENT_AMBULATORY_CARE_PROVIDER_SITE_OTHER): Admitting: Orthopedic Surgery

## 2024-01-23 ENCOUNTER — Other Ambulatory Visit: Payer: Self-pay

## 2024-01-23 DIAGNOSIS — G8929 Other chronic pain: Secondary | ICD-10-CM | POA: Diagnosis not present

## 2024-01-23 DIAGNOSIS — M25511 Pain in right shoulder: Secondary | ICD-10-CM

## 2024-01-23 DIAGNOSIS — M65911 Unspecified synovitis and tenosynovitis, right shoulder: Secondary | ICD-10-CM | POA: Diagnosis not present

## 2024-01-23 NOTE — Progress Notes (Signed)
 Office Visit Note   Patient: [name]           Date of Birth: 11/06/1950           MRN: [id number] Visit Date: 01/23/2024 Requested by: Lorella Roles, MD [address] [place],  [place] [address] PCP: Lorella Roles, MD  Subjective: Chief Complaint  Patient presents with   Right Shoulder - Pain    HPI: [name] is a 73 y.o. female who presents to the office reporting right shoulder pain.  Patient states she is ready to schedule right shoulder surgery.  She has difficulty lifting her arm actively compared with the other side.  She has had an MRI scan which shows full-thickness supraspinatus tear with some tendon retraction and no significant atrophy.  Not too much arthritis in the joint.  Last hemoglobin A1c on 12/16/2023 was 9.8.  We will need to recheck that on 03/16/2024 and if it is less than 8 we can go ahead and schedule rotator cuff repair.  Toradol  injected into the shoulder today and we will prescribe tramadol .  Follow-up in late July for clinical recheck..                ROS: All systems reviewed are negative as they relate to the chief complaint within the history of present illness.  Patient denies fevers or chills.  Assessment & Plan: Visit Diagnoses:  1. Chronic right shoulder pain     Plan: Impression is symptomatic right shoulder rotator cuff tear in a 73 year old patient.  Still having some weakness.  Does not look like she is getting a frozen shoulder yet.  She is on Lyrica .  Toradol  injected today.  Tramadol  added for her pain in the shoulder.  I do want her to keep the shoulder as active as possible so it does not freeze up before surgery.  Follow-up at the end of July for clinical recheck after she has had her hemoglobin A1c rechecked  Follow-Up Instructions: No follow-ups on file.   Orders:  Orders Placed This Encounter  Procedures   US  Guided Needle Placement - No Linked Charges   No orders of the defined types were placed in this  encounter.     Procedures: Large Joint Inj: R glenohumeral on 01/23/2024 3:50 PM Indications: diagnostic evaluation and pain Details: 22 G 1.5 in needle, ultrasound-guided posterior approach  Arthrogram: No  Medications: 9 mL bupivacaine  0.5 % Outcome: tolerated well, no immediate complications Procedure, treatment alternatives, risks and benefits explained, specific risks discussed. Consent was given by the patient. Immediately prior to procedure a time out was called to verify the correct patient, procedure, equipment, support staff and site/side marked as required. Patient was prepped and draped in the usual sterile fashion.    Toradol  injected   Clinical Data: No additional findings.  Objective: Vital Signs: LMP  (LMP Unknown)   Physical Exam:  Constitutional: Patient appears well-developed HEENT:  Head: Normocephalic Eyes:EOM are normal Neck: Normal range of motion Cardiovascular: Normal rate Pulmonary/chest: Effort normal Neurologic: Patient is alert Skin: Skin is warm Psychiatric: Patient has normal mood and affect  Ortho Exam: Ortho exam demonstrates right shoulder range of motion of 30/70/100.  This is slightly more than she was at last clinic visit.  She does have some coarseness with passive range of motion at 90 degrees of abduction.  Axillary nerve intact with firing of the deltoid.  Active abduction and forward flexion both only to about 45 degrees.  Specialty Comments:  No specialty comments available.  Imaging: US  Guided Needle Placement - No Linked Charges Result Date: 01/23/2024 Ultrasound imaging demonstrates needle placement into the glenohumeral joint with injection of fluid into the joint and no complicating features. Right shoulder    PMFS History: Patient Active Problem List   Diagnosis Date Noted   Moderate nonproliferative diabetic retinopathy (HCC) 04/03/2022   Abdominal bloating 07/03/2021   Chronic idiopathic constipation 07/03/2021    Colon cancer screening 07/03/2021   Diverticulosis of large intestine without perforation or abscess without bleeding 07/03/2021   Dysphagia 07/03/2021   Flatulence, eructation and gas pain 07/03/2021   Hiatal hernia 07/03/2021   Quadriceps weakness 02/06/2021   Syncope 11/29/2020   Mild aortic stenosis 11/29/2020   Irritable bowel syndrome with diarrhea 10/10/2020   Gastroesophageal reflux disease with esophagitis 10/10/2020   Postlaminectomy syndrome, not elsewhere classified 07/03/2020   S/P lumbar laminectomy 03/30/2020   Localized swelling, mass and lump, multiple sites 03/27/2018   Type II diabetes mellitus with renal manifestations (HCC) 12/27/2017   HLD (hyperlipidemia) 12/27/2017   HTN (hypertension) 12/27/2017   CKD (chronic kidney disease), stage III (HCC) 12/27/2017   Cough variant asthma 12/01/2015   Osteoarthritis of left knee, primary localized 08/17/2014   Knee osteoarthritis 08/17/2014   Hypoglycemia 11/15/2013   Other and unspecified hyperlipidemia 08/06/2013   Type II diabetes mellitus, uncontrolled 07/20/2013   Varicose veins of bilateral lower extremities with other complications 03/19/2013   Fibromyalgia    GERD 05/18/2010   ABDOMINAL PAIN, GENERALIZED 05/02/2010   OBESITY 04/20/2010   BURSITIS, LEFT SHOULDER 03/09/2010   Lipoma of arm s/p excision 01/29/2014 01/10/2010   DEPRESSION 12/27/2009   BACK PAIN WITH RADICULOPATHY 12/27/2009   CYSTITIS, ACUTE 10/21/2009   MICROSCOPIC HEMATURIA 10/18/2009   KNEE PAIN, BILATERAL 10/18/2009   NEUROPATHY 09/08/2009   Asthma 09/08/2009   STRESS INCONTINENCE 09/08/2009   CHEST PAIN 07/19/2009   ESOPHAGEAL STRICTURE 08/27/2005   Past Medical History:  Diagnosis Date   Aortic stenosis    mild on 04/07/19 echo   Arthritis    L knee, hands, back    Asthma    Diabetes mellitus type 2, insulin  dependent (HCC)    Fibromyalgia    Full dentures    Full dentures    GERD (gastroesophageal reflux disease)    H/O hiatal  hernia    Headache    h/o migraines - was followed for a time with a wellness doctor   History of esophageal dilatation    History of rhabdomyolysis    03/ 2015   Hyperlipidemia    Hypertension    Insulin  pump in place    since 12/ 2014--  [employer]   Lumbar disc herniation    right leg weakness   Moderate nonproliferative diabetic retinopathy (HCC) 04/03/2022   Osteoarthritis of left knee, primary localized 08/17/2014   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   Renal insufficiency    Rhabdomyolysis 11/15/2013   SUI (stress urinary incontinence, female)    Syncope 12/27/2017   Wears glasses    Wears glasses     Family History  Problem Relation Age of Onset   Diabetes Mother    Hypertension Mother    Lung cancer Mother        smoked   Diabetes Father    Hypertension Father    Lung cancer Father        smoked   Heart failure Sister    Diabetes Brother    Diabetes Maternal Grandmother    Cancer Maternal  Grandmother     Past Surgical History:  Procedure Laterality Date   BLADDER SUSPENSION N/A 03/09/2014   Procedure: CYSTOSCOPY/SLING;  Surgeon: Devorah Fonder, MD;  Location: [facility];  Service: Urology;  Laterality: N/A;   CARDIAC CATHETERIZATION  07-20-2009   DR Magnus Schuller   MODERATE LVH/  NORMAL  LVEF/  NORMAL CORONARY AND RENAL ARTERIES   CARPAL TUNNEL RELEASE Right 2000   CHOLECYSTECTOMY  1990   COLONOSCOPY WITH ESOPHAGOGASTRODUODENOSCOPY (EGD)  06-18-2002   ESOPHAGOGASTRODUODENOSCOPY (EGD) WITH ESOPHAGEAL DILATION  06-12-2010   EXCISION LEFT UPPER ARM MASS  01-29-2014   EYE SURGERY  2010   laser on R eye   INTERSTIM IMPLANT REMOVAL N/A 03/06/2019   Procedure: REMOVAL OF Simona Dublin IMPLANT;  Surgeon: Erman Hayward, MD;  Location: [facility];  Service: Urology;  Laterality: N/A;   KNEE ARTHROSCOPY Right 1989  &  2002   LUMBAR LAMINECTOMY N/A 02/19/2020   Procedure: right L4 hemiaminectomy, microdiscectomy;  Surgeon: Adah Acron, MD;  Location: [facility] OR;  Service:  Orthopedics;  Laterality: N/A;   MULTIPLE TOOTH EXTRACTIONS     NEGATIVE SLEEP STUDY  2012   PER PT   PARTIAL KNEE ARTHROPLASTY Left 08/17/2014   Procedure: LEFT UNICOMPARTMENTAL KNEE;  Surgeon: Neville Barbone, MD;  Location: [facility] OR;  Service: Orthopedics;  Laterality: Left;   Social History   Occupational History   Occupation: disability  Tobacco Use   Smoking status: Never   Smokeless tobacco: Never  Vaping Use   Vaping status: Never Used  Substance and Sexual Activity   Alcohol use: No   Drug use: No   Sexual activity: Not Currently

## 2024-01-24 ENCOUNTER — Other Ambulatory Visit: Payer: Self-pay | Admitting: Neurology

## 2024-01-26 MED ORDER — TRAMADOL HCL 50 MG PO TABS: 50.0000 mg | ORAL_TABLET | Freq: Four times a day (QID) | ORAL | 0 refills | Status: DC | PRN

## 2024-01-26 MED ORDER — BUPIVACAINE HCL 0.5 % IJ SOLN
9.0000 mL | INTRAMUSCULAR | Status: AC | PRN
Start: 2024-01-23 — End: 2024-01-23
  Administered 2024-01-23: 9 mL via INTRA_ARTICULAR

## 2024-01-26 NOTE — Addendum Note (Signed)
 Addended by: Zandra Hew on: 01/26/2024 09:01 PM   Modules accepted: Orders

## 2024-02-07 ENCOUNTER — Telehealth: Payer: Self-pay | Admitting: Orthopedic Surgery

## 2024-02-07 NOTE — Telephone Encounter (Signed)
 Patient calling to let Dr. Rozelle Corning know her  A1c is a 8.4.  Patient is wanting surgery on her right shoulder.  Patient's call back number is [phone]

## 2024-02-10 ENCOUNTER — Telehealth: Payer: Self-pay | Admitting: *Deleted

## 2024-02-10 NOTE — Telephone Encounter (Signed)
 I think she is okay to go in August.  Her A1c is trending down.  I would have her continue doing what she is doing and we will get her a date in August just because based on the trend I think it will be low enough I then to go ahead and move forward.  Thanks

## 2024-02-10 NOTE — Telephone Encounter (Signed)
 Pt called to see if her surgery can be scheduled. She went to her Dr. And her A!C was 8.4. She would like to know what to do please.

## 2024-02-11 NOTE — Telephone Encounter (Signed)
 Done thx

## 2024-02-11 NOTE — Telephone Encounter (Signed)
 Duplicate see other note. This has been addressed.

## 2024-03-03 ENCOUNTER — Ambulatory Visit: Admitting: Neurology

## 2024-03-17 ENCOUNTER — Telehealth: Payer: Self-pay

## 2024-03-17 ENCOUNTER — Other Ambulatory Visit: Payer: Self-pay

## 2024-03-17 DIAGNOSIS — G8929 Other chronic pain: Secondary | ICD-10-CM

## 2024-03-17 NOTE — Telephone Encounter (Signed)
-----   Message from Barnie SAILOR sent at 03/16/2024  2:41 PM EDT ----- Regarding: DOES NOT HAVE CARDIOLOGIST Patient would like to have surgery.  We are holding a date of 04-14-24 at [facility] with Dr. Addie  for right shoulder arthroscopy, debridement, biceps tenodesis, mini open rotator cuff tear repair.   Dr. Addie has requested both a medical and a cardiac clearance  A request for medical has been sent to PCP, however patient states she has never been seen by cardiologist and does not have one.  Can you please put in a referral to [facility] so they reach out to her for an appointment.

## 2024-03-17 NOTE — Telephone Encounter (Signed)
 Referral has been placed.

## 2024-05-21 NOTE — Progress Notes (Signed)
 This encounter was created in error - please disregard.

## 2024-06-01 ENCOUNTER — Ambulatory Visit

## 2024-06-01 DIAGNOSIS — E119 Type 2 diabetes mellitus without complications: Secondary | ICD-10-CM

## 2024-06-01 DIAGNOSIS — N1831 Chronic kidney disease, stage 3a: Secondary | ICD-10-CM

## 2024-06-01 DIAGNOSIS — I10 Essential (primary) hypertension: Secondary | ICD-10-CM

## 2024-06-01 DIAGNOSIS — E785 Hyperlipidemia, unspecified: Secondary | ICD-10-CM

## 2024-06-01 DIAGNOSIS — R079 Chest pain, unspecified: Secondary | ICD-10-CM

## 2024-06-17 ENCOUNTER — Ambulatory Visit: Admitting: Cardiovascular Disease

## 2024-06-29 ENCOUNTER — Encounter: Payer: Self-pay | Admitting: Radiology

## 2024-07-06 ENCOUNTER — Ambulatory Visit: Attending: Cardiovascular Disease | Admitting: Cardiovascular Disease

## 2024-07-06 ENCOUNTER — Encounter: Payer: Self-pay | Admitting: Cardiovascular Disease

## 2024-07-06 VITALS — BP 185/91 | HR 85 | Ht 64.0 in | Wt 133.4 lb

## 2024-07-06 DIAGNOSIS — Z01810 Encounter for preprocedural cardiovascular examination: Secondary | ICD-10-CM | POA: Diagnosis not present

## 2024-07-06 DIAGNOSIS — M25511 Pain in right shoulder: Secondary | ICD-10-CM | POA: Diagnosis not present

## 2024-07-06 DIAGNOSIS — G8929 Other chronic pain: Secondary | ICD-10-CM | POA: Diagnosis not present

## 2024-07-06 NOTE — Patient Instructions (Signed)
 Medication Instructions:  No medication changes were made at this visit. Continue current regimen.   *If you need a refill on your cardiac medications before your next appointment, please call your pharmacy*  Lab Work: None ordered today. If you have labs (blood work) drawn today and your tests are completely normal, you will receive your results only by: MyChart Message (if you have MyChart) OR A paper copy in the mail If you have any lab test that is abnormal or we need to change your treatment, we will call you to review the results.  Testing/Procedures: None ordered today.  Follow-Up: At [name], you and your health needs are our priority.  As part of our continuing mission to provide you with exceptional heart care, our providers are all part of one team.  This team includes your primary Cardiologist (physician) and Advanced Practice Providers or APPs (Physician Assistants and Nurse Practitioners) who all work together to provide you with the care you need, when you need it.  Your next appointment:   As needed  Provider:   Ozell Fell, MD

## 2024-07-06 NOTE — Progress Notes (Signed)
 Cardiology Office Note:    Date:  07/06/2024   ID:  [name], [name] 1950-09-16, MRN [id number]  PCP:  Haze Kingfisher, MD   [facility] Providers Cardiologist:  None     Referring MD: Addie Cordella Hamilton, MD   Chief Complaint  Patient presents with   Pre-op Exam    History of Present Illness:    [name] is a 73 y.o. female presenting for preoperative cardiac evaluation for right shoulder surgery.  The patient reports about 1 year of right shoulder pain and physical disability.  She is hoping to have surgery before the end of this calendar year.  She is here with her partner today. States that her BP usually runs 130's/80's, but reports that her BP is elevated today because of shoulder pain and anxiety about being here at the cardiologist.  She is able to walk at least 2 city blocks and routinely has to go up 2 flights of stairs and denies any exertional chest pain, chest pressure, or shortness of breath with these activities.  She denies heart palpitations, lightheadedness, or syncope.  She has no cardiac-related complaints today.  The patient's most recent echocardiogram in 2022 showed an elevated mid cavitary gradient due to hyperdynamic LV function with LVEF 70 to 75%, hyperdynamic RV function, and mild aortic sclerosis without any evidence of significant aortic stenosis.  ABIs were also checked in 2022 and were shown to be normal with no evidence of lower extremity arterial obstruction.  Past Medical History:  Diagnosis Date   Aortic stenosis    mild on 04/07/19 echo   Arthritis    L knee, hands, back    Asthma    Diabetes mellitus type 2, insulin  dependent (HCC)    Fibromyalgia    Full dentures    Full dentures    GERD (gastroesophageal reflux disease)    H/O hiatal hernia    Headache    h/o migraines - was followed for a time with a wellness doctor   History of esophageal dilatation    History of rhabdomyolysis    03/ 2015    Hyperlipidemia    Hypertension    Insulin  pump in place    since 12/ 2014--  [employer]   Lumbar disc herniation    right leg weakness   Moderate nonproliferative diabetic retinopathy (HCC) 04/03/2022   Osteoarthritis of left knee, primary localized 08/17/2014   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   Renal insufficiency    Rhabdomyolysis 11/15/2013   SUI (stress urinary incontinence, female)    Syncope 12/27/2017   Wears glasses    Wears glasses    Past Surgical History:  Procedure Laterality Date   BLADDER SUSPENSION N/A 03/09/2014   Procedure: CYSTOSCOPY/SLING;  Surgeon: Hamilton DELENA Elizabeth, MD;  Location: [facility];  Service: Urology;  Laterality: N/A;   CARDIAC CATHETERIZATION  07-20-2009   DR DEBBY SOR   MODERATE LVH/  NORMAL  LVEF/  NORMAL CORONARY AND RENAL ARTERIES   CARPAL TUNNEL RELEASE Right 2000   CHOLECYSTECTOMY  1990   COLONOSCOPY WITH ESOPHAGOGASTRODUODENOSCOPY (EGD)  06-18-2002   ESOPHAGOGASTRODUODENOSCOPY (EGD) WITH ESOPHAGEAL DILATION  06-12-2010   EXCISION LEFT UPPER ARM MASS  01-29-2014   EYE SURGERY  2010   laser on R eye   INTERSTIM IMPLANT REMOVAL N/A 03/06/2019   Procedure: REMOVAL OF RENNA IMPLANT;  Surgeon: Elizabeth Hamilton, MD;  Location: [facility];  Service: Urology;  Laterality: N/A;   KNEE ARTHROSCOPY Right 1989  &  2002  LUMBAR LAMINECTOMY N/A 02/19/2020   Procedure: right L4 hemiaminectomy, microdiscectomy;  Surgeon: Barbarann Oneil BROCKS, MD;  Location: [facility] OR;  Service: Orthopedics;  Laterality: N/A;   MULTIPLE TOOTH EXTRACTIONS     NEGATIVE SLEEP STUDY  2012   PER PT   PARTIAL KNEE ARTHROPLASTY Left 08/17/2014   Procedure: LEFT UNICOMPARTMENTAL KNEE;  Surgeon: Fonda SHAUNNA Olmsted, MD;  Location: [facility] OR;  Service: Orthopedics;  Laterality: Left;    Current Medications: Current Meds  Medication Sig   amLODipine  (NORVASC ) 5 MG tablet Take 1 tablet (5 mg total) by mouth at bedtime. (Patient taking differently: Take 10 mg by mouth at bedtime. Take 10 mg in  the morning.)   aspirin  EC 81 MG EC tablet Take 1 tablet (81 mg total) by mouth daily. Swallow whole.   atorvastatin  (LIPITOR) 10 MG tablet Take 1 tablet (10 mg total) by mouth daily.   blood glucose meter kit and supplies KIT Dispense based on patient and insurance preference. Check blood sugars 4 times daily E11.69   Continuous Blood Gluc Receiver (FREESTYLE LIBRE READER) DEVI 1 each by Does not apply route See admin instructions. CGM device-Freestyle Libre   Continuous Blood Gluc Sensor (FREESTYLE LIBRE 14 DAY SENSOR) MISC 1 each by Does not apply route See admin instructions. CGM 14-day sensors; send refills to [employer] (in Epic)   DULoxetine  (CYMBALTA ) 30 MG capsule TAKE 1 CAPSULE BY MOUTH DAILY   Finerenone  (KERENDIA ) 10 MG TABS Take 1 tablet by mouth daily.   HUMALOG  100 UNIT/ML injection Inject 0.1 mLs (10 Units total) into the skin 3 (three) times daily with meals. (Patient taking differently: Inject 8 Units into the skin 3 (three) times daily with meals. Inject 8 units 3 times a day. Max daily dose of 40 units.)   insulin  degludec (TRESIBA  FLEXTOUCH) 100 UNIT/ML FlexTouch Pen Inject 27 Units into the skin daily. (Patient taking differently: Inject 40 Units into the skin daily. Inject 35 units subcutaneous route daily)   Insulin  Pen Needle (BD PEN NEEDLE MICRO U/F) 32G X 6 MM MISC DX:E11.65 USE TO CHECK BLOOD SUGARS THREE TIMES A DAY   Lancets Misc. MISC 1 each by Does not apply route 4 (four) times daily -  with meals and at bedtime.   linaclotide  (LINZESS ) 145 MCG CAPS capsule Take 1 capsule (145 mcg total) by mouth daily before breakfast.   omeprazole (PRILOSEC) 40 MG capsule Take 40 mg by mouth daily.   ONETOUCH VERIO test strip USE TO test blood sugar FOUR TIMES DAILY   OZEMPIC , 0.25 OR 0.5 MG/DOSE, 2 MG/3ML SOPN Inject 0.5 mg into the skin once a week.   pantoprazole  (PROTONIX ) 40 MG tablet TAKE ONE TABLET BY MOUTH ONCE DAILY   pregabalin  (LYRICA ) 50 MG capsule TAKE ONE  CAPSULE BY MOUTH BEFORE BREAKFAST and TAKE ONE CAPSULE BY MOUTH EVERY EVENING and TAKE ONE CAPSULE BY MOUTH EVERYDAY AT BEDTIME (Patient taking differently: 100 mg. Take 1 capsule three times daily)   traMADol  (ULTRAM ) 50 MG tablet Take 1 tablet (50 mg total) by mouth every 6 (six) hours as needed.   valsartan  (DIOVAN ) 40 MG tablet Take 1 tablet (40 mg total) by mouth daily. (Patient taking differently: Take 160 mg by mouth daily. Take one tablet daily)   Vibegron (GEMTESA) 75 MG TABS Take 75 mg by mouth daily.   zolpidem  (AMBIEN ) 5 MG tablet Take 5 mg by mouth at bedtime as needed.     Allergies:   Pollen extract, Tomato, and Codeine  ROS:   Please see the history of present illness.    All other systems reviewed and are negative.  EKGs/Labs/Other Studies Reviewed:    The following studies were reviewed today: Cardiac Studies & Procedures   ______________________________________________________________________________________________   STRESS TESTS  MYOCARDIAL PERFUSION IMAGING 11/23/2015  Interpretation Summary  The left ventricular ejection fraction is hyperdynamic (>65%).  Nuclear stress EF: 67%.  Blood pressure demonstrated a hypertensive response to exercise.  There was no ST segment deviation noted during stress.  The study is normal.  This is a low risk study.  Normal resting and stress perfusion with no ischemia or infarct  EF 67%  HTN response to exercise   ECHOCARDIOGRAM  ECHOCARDIOGRAM COMPLETE 11/30/2020  Narrative ECHOCARDIOGRAM REPORT    Patient Name:   [name] Date of Exam: 11/30/2020 Medical Rec #:  [id number]           Height:       61.0 in Accession #:    [id number]          Weight:       147.0 lb Date of Birth:  02-22-1951          BSA:          1.657 m Patient Age:    69 years            BP:           133/69 mmHg Patient Gender: F                   HR:           78 bpm. Exam Location:  Inpatient  Procedure: 2D Echo  Indications:     Syncope R55; Chest Pain  History:        Patient has prior history of Echocardiogram examinations, most recent 04/07/2019. Risk Factors:Diabetes, Hypertension and Dyslipidemia.  Sonographer:    Augustin Seals RDCS (AE) Referring Phys: [phone] JARED M GARDNER  IMPRESSIONS   1. Elevated mid-cavitary gradient due to hyperdynamic LV function. Left ventricular ejection fraction, by estimation, is 70 to 75%. The left ventricle has hyperdynamic function. The left ventricle has no regional wall motion abnormalities. Left ventricular diastolic parameters are consistent with Grade I diastolic dysfunction (impaired relaxation). 2. Right ventricular systolic function is hyperdynamic. The right ventricular size is normal. 3. The mitral valve is abnormal. Trivial mitral valve regurgitation. 4. The aortic valve is tricuspid. Aortic valve regurgitation is not visualized. Mild aortic valve sclerosis is present, with no evidence of aortic valve stenosis. Aortic valve mean gradient measures 11.0 mmHg, but this is primarily subvalvular (intracavitary) -no dynamic LVOT obstruction is suspected. The aortic valve appears to open freely. 5. The inferior vena cava is normal in size with greater than 50% respiratory variability, suggesting right atrial pressure of 3 mmHg.  Comparison(s): Changes from prior study are noted. 04/07/2019: LVEF 60-65%, mild aortic stenosis - mean gradient 6 mmHg.  FINDINGS Left Ventricle: Elevated mid-cavitary gradient due to hyperdynamic LV function. Left ventricular ejection fraction, by estimation, is 70 to 75%. The left ventricle has hyperdynamic function. The left ventricle has no regional wall motion abnormalities. The left ventricular internal cavity size was normal in size. There is no left ventricular hypertrophy. Left ventricular diastolic parameters are consistent with Grade I diastolic dysfunction (impaired relaxation). Indeterminate filling pressures.  Right Ventricle: The  right ventricular size is normal. No increase in right ventricular wall thickness. Right ventricular systolic function is hyperdynamic.  Left Atrium: Left atrial  size was normal in size.  Right Atrium: Right atrial size was normal in size.  Pericardium: There is no evidence of pericardial effusion.  Mitral Valve: The mitral valve is abnormal. There is mild thickening of the mitral valve leaflet(s). Trivial mitral valve regurgitation.  Tricuspid Valve: The tricuspid valve is grossly normal. Tricuspid valve regurgitation is trivial.  Aortic Valve: The aortic valve is tricuspid. Aortic valve regurgitation is not visualized. Mild aortic valve sclerosis is present, with no evidence of aortic valve stenosis. Aortic valve mean gradient measures 11.0 mmHg.  Pulmonic Valve: The pulmonic valve was grossly normal. Pulmonic valve regurgitation is not visualized.  Aorta: The aortic root and ascending aorta are structurally normal, with no evidence of dilitation.  Venous: The inferior vena cava is normal in size with greater than 50% respiratory variability, suggesting right atrial pressure of 3 mmHg.  IAS/Shunts: No atrial level shunt detected by color flow Doppler.   LEFT VENTRICLE PLAX 2D LVIDd:         2.70 cm  Diastology LVIDs:         1.60 cm  LV e' medial:    6.64 cm/s LV PW:         0.80 cm  LV E/e' medial:  11.7 LV IVS:        0.90 cm  LV e' lateral:   7.07 cm/s LVOT diam:     2.00 cm  LV E/e' lateral: 11.0 LV SV:         67 LV SV Index:   41 LVOT Area:     3.14 cm   RIGHT VENTRICLE RV S prime:     14.20 cm/s TAPSE (M-mode): 1.7 cm  LEFT ATRIUM             Index       RIGHT ATRIUM          Index LA diam:        1.90 cm 1.15 cm/m  RA Area:     9.71 cm LA Vol (A2C):   20.8 ml 12.55 ml/m RA Volume:   15.60 ml 9.41 ml/m LA Vol (A4C):   17.0 ml 10.26 ml/m LA Biplane Vol: 19.3 ml 11.65 ml/m AORTIC VALVE AV Mean Grad: 11.0 mmHg LVOT Vmax:    109.00 cm/s LVOT Vmean:   64.800  cm/s LVOT VTI:     0.214 m  AORTA Ao Root diam: 2.30 cm Ao Asc diam:  2.70 cm  MITRAL VALVE MV Area (PHT): 2.37 cm     SHUNTS MV Decel Time: 320 msec     Systemic VTI:  0.21 m MV E velocity: 77.80 cm/s   Systemic Diam: 2.00 cm MV A velocity: 106.00 cm/s MV E/A ratio:  0.73  Vinie Maxcy MD Electronically signed by Vinie Maxcy MD Signature Date/Time: 11/30/2020/10:38:58 AM    Final    MONITORS  CARDIAC EVENT MONITOR 01/02/2018  Narrative 30 Day Event Monitor  Quality: Fair.  Baseline artifact. Predominant rhythm: sinus rhythm Average heart rate: 77 bpm Max heart rate: 127 bpm Min heart rate: 54 bpm  No arrhythmias noted.  Tiffany C. Raford, MD, [facility] 02/05/2018 1:20 PM       ______________________________________________________________________________________________      EKG:   EKG Interpretation Date/Time:  Monday July 06 2024 09:25:48 EST Ventricular Rate:  86 PR Interval:  164 QRS Duration:  78 QT Interval:  374 QTC Calculation: 447 R Axis:   58  Text Interpretation: Normal sinus rhythm Septal infarct (cited on or before  06-Jul-2024) When compared with ECG of 13-Oct-2022 22:11, Nonspecific T wave abnormality has replaced inverted T waves in Inferior leads Confirmed by Wonda Sharper [phone]) on 07/06/2024 9:48:27 AM    Recent Labs: No results found for requested labs within last 365 days.  Recent Lipid Panel    Component Value Date/Time   CHOL 188 01/24/2022 1058   TRIG 82 01/24/2022 1058   HDL 53 01/24/2022 1058   CHOLHDL 3.5 01/24/2022 1058   CHOLHDL 4 09/07/2016 1341   VLDL 15.6 09/07/2016 1341   LDLCALC 120 (H) 01/24/2022 1058   LDLDIRECT 152.8 10/09/2013 0938          Physical Exam:    VS:  BP (!) 185/91   Pulse 85   Ht 5' 4 (1.626 m)   Wt 133 lb 6.4 oz (60.5 kg)   LMP  (LMP Unknown)   SpO2 96%   BMI 22.90 kg/m     Wt Readings from Last 3 Encounters:  07/06/24 133 lb 6.4 oz (60.5 kg)  11/10/23 145 lb (65.8 kg)   02/20/23 152 lb (68.9 kg)     GEN:  Well nourished, well developed in no acute distress HEENT: Normal NECK: No JVD; No carotid bruits LYMPHATICS: No lymphadenopathy CARDIAC: RRR, 1/6 systolic ejection murmur at the right upper sternal border RESPIRATORY:  Clear to auscultation without rales, wheezing or rhonchi  ABDOMEN: Soft, non-tender, non-distended MUSCULOSKELETAL:  No edema; No deformity  SKIN: Warm and dry NEUROLOGIC:  Alert and oriented x 3 PSYCHIATRIC:  Normal affect   Assessment & Plan Chronic right shoulder pain Pending right shoulder surgery, see discussion below Preoperative cardiovascular examination The patient is able to achieve 4 metabolic equivalents or greater with no functional limitation.  Her twelve-lead EKG shows no ischemic changes.  She has no recent symptoms related to arrhythmia, congestive heart failure, or angina.  She is at low cardiovascular risk of complications with her upcoming surgery and can proceed without further testing.  Her blood pressure is normally well-controlled based on her report of home readings.  I think today's blood pressure elevation is primarily related to anxiety about cardiology evaluation.  I asked her to continue to follow her home blood pressures to make sure that her blood pressure is ranging less than 140/90 mmHg.  I reviewed the patient's echocardiogram from 2022 where she had hyperdynamic LV and RV function with only grade 1 diastolic dysfunction.  Considering her asymptomatic status, further imaging is not indicated at this time.  I would be happy to see the patient in the future if any problems arise.  Otherwise, I will plan to see her back as needed.      Medication Adjustments/Labs and Tests Ordered: Current medicines are reviewed at length with the patient today.  Concerns regarding medicines are outlined above.  Orders Placed This Encounter  Procedures   EKG 12-Lead   No orders of the defined types were placed in this  encounter.   Patient Instructions  Medication Instructions:  No medication changes were made at this visit. Continue current regimen.   *If you need a refill on your cardiac medications before your next appointment, please call your pharmacy*  Lab Work: None ordered today. If you have labs (blood work) drawn today and your tests are completely normal, you will receive your results only by: MyChart Message (if you have MyChart) OR A paper copy in the mail If you have any lab test that is abnormal or we need to change your treatment, we  will call you to review the results.  Testing/Procedures: None ordered today.  Follow-Up: At [facility], you and your health needs are our priority.  As part of our continuing mission to provide you with exceptional heart care, our providers are all part of one team.  This team includes your primary Cardiologist (physician) and Advanced Practice Providers or APPs (Physician Assistants and Nurse Practitioners) who all work together to provide you with the care you need, when you need it.  Your next appointment:   As needed  Provider:   Ozell Fell, MD      Signed, Ozell Fell, MD  07/06/2024 10:04 AM    [facility]

## 2024-07-09 ENCOUNTER — Ambulatory Visit: Admitting: Cardiovascular Disease

## 2024-07-14 NOTE — Pre-Procedure Instructions (Signed)
 Surgical Instructions   Your procedure is scheduled on July 17, 2024. Report to [facility] Main Entrance A at 8:50 A.M., then check in with the Admitting office. Any questions or running late day of surgery: call [phone]  Questions prior to your surgery date: call [phone], Monday-Friday, 8am-4pm. If you experience any cold or flu symptoms such as cough, fever, chills, shortness of breath, etc. between now and your scheduled surgery, please notify us  at the above number.     Remember:  Do not eat after midnight the night before your surgery   You may drink clear liquids until 7:50 AM the morning of your surgery.   Clear liquids allowed are: Water , Non-Citrus Juices (without pulp), Carbonated Beverages, Clear Tea (no milk, honey, etc.), Black Coffee Only (NO MILK, CREAM OR POWDERED CREAMER of any kind), and Gatorade.  Patient Instructions  The night before surgery:  No food after midnight. ONLY clear liquids after midnight  The day of surgery (if you have diabetes): Drink ONE (1) 12 oz G2 given to you in your pre admission testing appointment by 7:50 AM the morning of surgery. Drink in one sitting. Do not sip.  This drink was given to you during your hospital  pre-op appointment visit.  Nothing else to drink after completing the  12 oz bottle of G2.         If you have questions, please contact your surgeon's office.   Take these medicines the morning of surgery with A SIP OF WATER : amLODipine  (NORVASC )  DULoxetine  (CYMBALTA )  Finerenone  (KERENDIA )  pantoprazole  (PROTONIX )  pregabalin  (LYRICA )  rosuvastatin (CRESTOR)  Vibegron (GEMTESA)    May take these medicines IF NEEDED: linaclotide  (LINZESS )    Follow your surgeon's instructions on when to stop Aspirin .  If no instructions were given by your surgeon then you will need to call the office to get those instructions.     One week prior to surgery, STOP taking any Aleve, Naproxen, Ibuprofen , Motrin ,  Advil , Goody's, BC's, all herbal medications, fish oil, and non-prescription vitamins.   WHAT DO I DO ABOUT MY DIABETES MEDICATION?   STOP taking your Semaglutide  (OZEMPIC ) one week prior to surgery. DO NOT take any doses after November 13th.   THE NIGHT BEFORE SURGERY/THE MORNING OF SURGERY, take 20 units of insulin  degludec (TRESIBA  FLEXTOUCH).  DO NOT taking a bedtime dose of your HUMALOG  insulin .  Morning of surgery, if your CBG is greater than 220 mg/dL, you may take  of your sliding scale HUMALOG  insulin .   HOW TO MANAGE YOUR DIABETES BEFORE AND AFTER SURGERY  Why is it important to control my blood sugar before and after surgery? Improving blood sugar levels before and after surgery helps healing and can limit problems. A way of improving blood sugar control is eating a healthy diet by:  Eating less sugar and carbohydrates  Increasing activity/exercise  Talking with your doctor about reaching your blood sugar goals High blood sugars (greater than 180 mg/dL) can raise your risk of infections and slow your recovery, so you will need to focus on controlling your diabetes during the weeks before surgery. Make sure that the doctor who takes care of your diabetes knows about your planned surgery including the date and location.  How do I manage my blood sugar before surgery? Check your blood sugar at least 4 times a day, starting 2 days before surgery, to make sure that the level is not too high or low.  Check your blood sugar the morning  of your surgery when you wake up and every 2 hours until you get to the Short Stay unit.  If your blood sugar is less than 70 mg/dL, you will need to treat for low blood sugar: Do not take insulin . Treat a low blood sugar (less than 70 mg/dL) with  cup of clear juice (cranberry or apple), 4 glucose tablets, OR glucose gel. Recheck blood sugar in 15 minutes after treatment (to make sure it is greater than 70 mg/dL). If your blood sugar is not  greater than 70 mg/dL on recheck, call [phone] for further instructions. Report your blood sugar to the short stay nurse when you get to Short Stay.  If you are admitted to the hospital after surgery: Your blood sugar will be checked by the staff and you will probably be given insulin  after surgery (instead of oral diabetes medicines) to make sure you have good blood sugar levels. The goal for blood sugar control after surgery is 80-180 mg/dL.                      Do NOT Smoke (Tobacco/Vaping) for 24 hours prior to your procedure.  If you use a CPAP at night, you may bring your mask/headgear for your overnight stay.   You will be asked to remove any contacts, glasses, piercing's, hearing aid's, dentures/partials prior to surgery. Please bring cases for these items if needed.    Patients discharged the day of surgery will not be allowed to drive home, and someone needs to stay with them for 24 hours.  SURGICAL WAITING ROOM VISITATION Patients may have no more than 2 support people in the waiting area - these visitors may rotate.   Pre-op nurse will coordinate an appropriate time for 1 ADULT support person, who may not rotate, to accompany patient in pre-op.  Children under the age of 52 must have an adult with them who is not the patient and must remain in the main waiting area with an adult.  If the patient needs to stay at the hospital during part of their recovery, the visitor guidelines for inpatient rooms apply.  Please refer to the [facility] website for the visitor guidelines for any additional information.   If you received a COVID test during your pre-op visit  it is requested that you wear a mask when out in public, stay away from anyone that may not be feeling well and notify your surgeon if you develop symptoms. If you have been in contact with anyone that has tested positive in the last 10 days please notify you surgeon.      Pre-operative CHG Bathing Instructions    You can play a key role in reducing the risk of infection after surgery. Your skin needs to be as free of germs as possible. You can reduce the number of germs on your skin by washing with CHG (chlorhexidine  gluconate) soap before surgery. CHG is an antiseptic soap that kills germs and continues to kill germs even after washing.   DO NOT use if you have an allergy to chlorhexidine /CHG or antibacterial soaps. If your skin becomes reddened or irritated, stop using the CHG and notify one of our RNs at [phone].              TAKE A SHOWER THE NIGHT BEFORE SURGERY   Please keep in mind the following:  DO NOT shave, including legs and underarms, 48 hours prior to surgery.   You may shave your  face before/day of surgery.  Place clean sheets on your bed the night before surgery Use a clean washcloth (not used since being washed) for shower. DO NOT sleep with pet's night before surgery.  CHG Shower Instructions:  Wash your face and private area with normal soap. If you choose to wash your hair, wash first with your normal shampoo.  After you use shampoo/soap, rinse your hair and body thoroughly to remove shampoo/soap residue.  Turn the water  OFF and apply half the bottle of CHG soap to a CLEAN washcloth.  Apply CHG soap ONLY FROM YOUR NECK DOWN TO YOUR TOES (washing for 3-5 minutes)  DO NOT use CHG soap on face, private areas, open wounds, or sores.  Pay special attention to the area where your surgery is being performed.  If you are having back surgery, having someone wash your back for you may be helpful. Wait 2 minutes after CHG soap is applied, then you may rinse off the CHG soap.  Pat dry with a clean towel  Put on clean pajamas    Additional instructions for the day of surgery: If you choose, you may shower the morning of surgery with an antibacterial soap.  DO NOT APPLY any lotions, deodorants, cologne, or perfumes.   Do not wear jewelry or makeup Do not wear nail polish, gel  polish, artificial nails, or any other type of covering on natural nails (fingers and toes) Do not bring valuables to the hospital. [facility] is not responsible for valuables/personal belongings. Put on clean/comfortable clothes.  Please brush your teeth.  Ask your nurse before applying any prescription medications to the skin.

## 2024-07-15 ENCOUNTER — Encounter (HOSPITAL_COMMUNITY): Payer: Self-pay

## 2024-07-15 ENCOUNTER — Telehealth: Payer: Self-pay

## 2024-07-15 ENCOUNTER — Other Ambulatory Visit: Payer: Self-pay

## 2024-07-15 ENCOUNTER — Encounter (HOSPITAL_COMMUNITY)
Admission: RE | Admit: 2024-07-15 | Discharge: 2024-07-15 | Disposition: A | Discharge status: Home / Self Care | Source: Ambulatory Visit | Attending: Orthopedic Surgery | Admitting: Orthopedic Surgery

## 2024-07-15 VITALS — BP 186/86 | HR 86 | Temp 98.1°F | Resp 17 | Ht 64.0 in | Wt 131.2 lb

## 2024-07-15 DIAGNOSIS — Z01812 Encounter for preprocedural laboratory examination: Secondary | ICD-10-CM | POA: Insufficient documentation

## 2024-07-15 DIAGNOSIS — I10 Essential (primary) hypertension: Secondary | ICD-10-CM | POA: Diagnosis not present

## 2024-07-15 DIAGNOSIS — Z794 Long term (current) use of insulin: Secondary | ICD-10-CM | POA: Insufficient documentation

## 2024-07-15 DIAGNOSIS — E119 Type 2 diabetes mellitus without complications: Secondary | ICD-10-CM | POA: Diagnosis not present

## 2024-07-15 HISTORY — DX: Pneumonia, unspecified organism: J18.9

## 2024-07-15 LAB — CBC
HCT: 43.8 % (ref 36.0–46.0)
Hemoglobin: 14.1 g/dL (ref 12.0–15.0)
MCH: 30 pg (ref 26.0–34.0)
MCHC: 32.2 g/dL (ref 30.0–36.0)
MCV: 93.2 fL (ref 80.0–100.0)
Platelets: 248 K/uL (ref 150–400)
RBC: 4.7 MIL/uL (ref 3.87–5.11)
RDW: 12.3 % (ref 11.5–15.5)
WBC: 4.8 K/uL (ref 4.0–10.5)
nRBC: 0 % (ref 0.0–0.2)

## 2024-07-15 LAB — BASIC METABOLIC PANEL WITH GFR
Anion gap: 15 (ref 5–15)
BUN: 11 mg/dL (ref 8–23)
CO2: 24 mmol/L (ref 22–32)
Calcium: 9.6 mg/dL (ref 8.9–10.3)
Chloride: 104 mmol/L (ref 98–111)
Creatinine, Ser: 0.82 mg/dL (ref 0.44–1.00)
GFR, Estimated: >60 mL/min (ref >60)
Glucose, Bld: 100 mg/dL — ABNORMAL HIGH (ref 70–99)
Potassium: 3.4 mmol/L — ABNORMAL LOW (ref 3.5–5.1)
Sodium: 143 mmol/L (ref 135–145)

## 2024-07-15 LAB — HEMOGLOBIN A1C
Hgb A1c MFr Bld: 6.6 % — ABNORMAL HIGH (ref 4.8–5.6)
Mean Plasma Glucose: 142.72 mg/dL

## 2024-07-15 LAB — GLUCOSE, CAPILLARY: Glucose-Capillary: 109 mg/dL — ABNORMAL HIGH (ref 70–99)

## 2024-07-15 NOTE — Telephone Encounter (Signed)
 Patient left a voicemail on Triage line. States she is having shoulder surgery on Friday. States she wears a G7 Sensor in the back of upper arm and has to change it today at 6 pm. Would like to know if she needs to change it today or no senor until after surgery. Please advise.    CB [phone]

## 2024-07-15 NOTE — Telephone Encounter (Signed)
 If the sensor is on the right arm then we will have to take it off Friday at the time of surgery

## 2024-07-15 NOTE — Progress Notes (Signed)
 PCP - Dr. Rayna Hemming Cardiologist - Dr. Ozell Fell - Last office visit 07/06/2024 for heart clearance. PRN follow-up  PPM/ICD - Denies Device Orders - n/a Rep Notified - n/a  Chest x-ray - n/a EKG - 07/06/2024 Stress Test - 11/23/2015 ECHO - 11/30/2020 Cardiac Cath - Denies  Sleep Study - Denies CPAP - n/a  Pt is DM2. She has a Dexcom CGM and checks blood sugar multiple times per day. Normal fasting is 90-101. CBG at pre-op appointment 109. A1c result pending.  Last dose of GLP1 agonist-  Last dose of Ozempic  was 11/13 GLP1 instructions: Pt instructed to not take anymore doses prior to surgery  Blood Thinner Instructions: n/a Aspirin  Instructions: Pts last dose of ASA was today, November 19th. [employer], PA-C made aware (pt had not received instructions from office)  ERAS Protcol - Clear liquids until 0750 morning of surgery PRE-SURGERY Ensure or G2- G2 given to pt with instructions  COVID TEST- n/a   Anesthesia review: Yes. Cardiac clearance.  Patient denies shortness of breath, fever, cough and chest pain at PAT appointment. Pt denies any respiratory illness/infection in the last two months.   All instructions explained to the patient, with a verbal understanding of the material. Patient agrees to go over the instructions while at home for a better understanding. Patient also instructed to self quarantine after being tested for COVID-19. The opportunity to ask questions was provided.

## 2024-07-16 NOTE — Progress Notes (Signed)
 Anesthesia Chart Review:  73 year old female follows with cardiology for history of HTN.  Echo in 11/2020 showed elevated mid cavitary gradient due to hyperdynamic LV function with LVEF 70 to 75%, hyperdynamic RV function, and mild aortic sclerosis without any evidence of significant aortic stenosis.  She was seen by Dr. Wonda 07/06/2024 for preop evaluation.  Per note, The patient is able to achieve 4 metabolic equivalents or greater with no functional limitation. Her twelve-lead EKG shows no ischemic changes. She has no recent symptoms related to arrhythmia, congestive heart failure, or angina. She is at low cardiovascular risk of complications with her upcoming surgery and can proceed without further testing. Her blood pressure is normally well-controlled based on her report of home readings. I think today's blood pressure elevation is primarily related to anxiety about cardiology evaluation. I asked her to continue to follow her home blood pressures to make sure that her blood pressure is ranging less than 140/90 mmHg. I reviewed the patient's echocardiogram from 2022 where she had hyperdynamic LV and RV function with only grade 1 diastolic dysfunction. Considering her asymptomatic status, further imaging is not indicated at this time. I would be happy to see the patient in the future if any problems arise. Otherwise, I will plan to see her back as needed.  Other pertinent history includes IDDM2 with insulin  pump, GERD on PPI, hiatal hernia.  Patient reports last dose of Ozempic  07/09/2024.  Preop labs reviewed, unremarkable.  DM2 well-controlled with A1c 6.6.  Blood pressure noted to be elevated at preadmission testing visit, 186/86.  She reports she had not yet taken her medications as she ran out and is awaiting refill.  She reports when she checks her blood pressure at home is generally 140-50/80s.  She was advised to take medications as prescribed and follow-up with PCP for cardiology if home  numbers remain elevated.  EKG 07/06/2024: Normal sinus rhythm.  Rate 86. Septal infarct (cited on or before 06-Jul-2024)     Lynwood Geofm RIGGERS [facility] Short Stay Center/Anesthesiology Phone [phone] 07/16/2024 11:05 AM

## 2024-07-16 NOTE — Anesthesia Preprocedure Evaluation (Addendum)
 Anesthesia Evaluation  Patient identified by MRN, date of birth, ID band Patient awake    Reviewed: Allergy & Precautions, NPO status , Patient's Chart, lab work & pertinent test results  History of Anesthesia Complications Negative for: history of anesthetic complications  Airway Mallampati: II  TM Distance: >3 FB Neck ROM: Full    Dental  (+) Edentulous Upper, Edentulous Lower   Pulmonary asthma    breath sounds clear to auscultation       Cardiovascular hypertension, Pt. on medications + Valvular Problems/Murmurs AS  Rhythm:Regular Rate:Normal  EKG 07/06/2024: Normal sinus rhythm.  Rate 86. Septal infarct (cited on or before 06-Jul-2024)  TTE (2022): 1. Elevated mid-cavitary gradient due to hyperdynamic LV function. Left  ventricular ejection fraction, by estimation, is 70 to 75%. The left  ventricle has hyperdynamic function. The left ventricle has no regional  wall motion abnormalities. Left ventricular diastolic parameters are consistent with Grade I diastolic dysfunction (impaired relaxation).   2. Right ventricular systolic function is hyperdynamic. The right  ventricular size is normal.   3. The mitral valve is abnormal. Trivial mitral valve regurgitation.   4. The aortic valve is tricuspid. Aortic valve regurgitation is not  visualized. Mild aortic valve sclerosis is present, with no evidence of  aortic valve stenosis. Aortic valve mean gradient measures 11.0 mmHg, but  this is primarily subvalvular (intracavitary) -no dynamic LVOT obstruction is suspected. The aortic valve appears to open freely.   5. The inferior vena cava is normal in size with greater than 50%  respiratory variability, suggesting right atrial pressure of 3 mmHg.      Neuro/Psych  PSYCHIATRIC DISORDERS  Depression       GI/Hepatic hiatal hernia,GERD  Medicated and Controlled,,  Endo/Other  diabetes, Type 2, Insulin  Dependent  GLP1-agonist last  dose (07/09/24)  Renal/GU Renal InsufficiencyRenal disease     Musculoskeletal  (+) Arthritis , Osteoarthritis,  Fibromyalgia -  Abdominal   Peds  Hematology Hgb 14.1, Plts 248K (07/15/24)   Anesthesia Other Findings   Reproductive/Obstetrics                              Anesthesia Physical Anesthesia Plan  ASA: 2  Anesthesia Plan: General and Regional   Post-op Pain Management:    Induction: Intravenous  PONV Risk Score and Plan: 2 and Ondansetron , Dexamethasone  and Treatment may vary due to age or medical condition  Airway Management Planned: Oral ETT  Additional Equipment: None  Intra-op Plan:   Post-operative Plan: Extubation in OR  Informed Consent:      Dental advisory given  Plan Discussed with: CRNA  Anesthesia Plan Comments: (PAT note by Lynwood Hope, PA-C: 73 year old female follows with cardiology for history of HTN.  Echo in 11/2020 showed elevated mid cavitary gradient due to hyperdynamic LV function with LVEF 70 to 75%, hyperdynamic RV function, and mild aortic sclerosis without any evidence of significant aortic stenosis.  She was seen by Dr. Wonda 07/06/2024 for preop evaluation.  Per note, The patient is able to achieve 4 metabolic equivalents or greater with no functional limitation. Her twelve-lead EKG shows no ischemic changes. She has no recent symptoms related to arrhythmia, congestive heart failure, or angina. She is at low cardiovascular risk of complications with her upcoming surgery and can proceed without further testing. Her blood pressure is normally well-controlled based on her report of home readings. I think today's blood pressure elevation is primarily related to anxiety  about cardiology evaluation. I asked her to continue to follow her home blood pressures to make sure that her blood pressure is ranging less than 140/90 mmHg. I reviewed the patient's echocardiogram from 2022 where she had hyperdynamic LV and RV  function with only grade 1 diastolic dysfunction. Considering her asymptomatic status, further imaging is not indicated at this time. I would be happy to see the patient in the future if any problems arise. Otherwise, I will plan to see her back as needed.  Other pertinent history includes IDDM2 with insulin  pump, GERD on PPI, hiatal hernia.  Patient reports last dose of Ozempic  07/09/2024.  Preop labs reviewed, unremarkable.  DM2 well-controlled with A1c 6.6.  Blood pressure noted to be elevated at preadmission testing visit, 186/86.  She reports she had not yet taken her medications as she ran out and is awaiting refill.  She reports when she checks her blood pressure at home is generally 140-50/80s.  She was advised to take medications as prescribed and follow-up with PCP for cardiology if home numbers remain elevated.  EKG 07/06/2024: Normal sinus rhythm.  Rate 86. Septal infarct (cited on or before 06-Jul-2024)   )         Anesthesia Quick Evaluation

## 2024-07-17 ENCOUNTER — Ambulatory Visit (HOSPITAL_BASED_OUTPATIENT_CLINIC_OR_DEPARTMENT_OTHER)

## 2024-07-17 ENCOUNTER — Encounter (HOSPITAL_COMMUNITY): Payer: Self-pay | Admitting: Orthopedic Surgery

## 2024-07-17 ENCOUNTER — Encounter (HOSPITAL_COMMUNITY): Admitting: Physician Assistant

## 2024-07-17 ENCOUNTER — Ambulatory Visit (HOSPITAL_COMMUNITY)
Admission: RE | Admit: 2024-07-17 | Discharge: 2024-07-17 | Disposition: A | Discharge status: Home / Self Care | Attending: Orthopedic Surgery | Admitting: Orthopedic Surgery

## 2024-07-17 ENCOUNTER — Other Ambulatory Visit: Payer: Self-pay

## 2024-07-17 ENCOUNTER — Encounter (HOSPITAL_COMMUNITY)
Admission: RE | Disposition: A | Discharge status: Home / Self Care | Payer: Self-pay | Source: Home / Self Care | Attending: Orthopedic Surgery

## 2024-07-17 DIAGNOSIS — X58XXXA Exposure to other specified factors, initial encounter: Secondary | ICD-10-CM | POA: Insufficient documentation

## 2024-07-17 DIAGNOSIS — M75101 Unspecified rotator cuff tear or rupture of right shoulder, not specified as traumatic: Secondary | ICD-10-CM | POA: Diagnosis not present

## 2024-07-17 DIAGNOSIS — M75121 Complete rotator cuff tear or rupture of right shoulder, not specified as traumatic: Secondary | ICD-10-CM

## 2024-07-17 DIAGNOSIS — K219 Gastro-esophageal reflux disease without esophagitis: Secondary | ICD-10-CM | POA: Insufficient documentation

## 2024-07-17 DIAGNOSIS — E119 Type 2 diabetes mellitus without complications: Secondary | ICD-10-CM | POA: Diagnosis not present

## 2024-07-17 DIAGNOSIS — M65911 Unspecified synovitis and tenosynovitis, right shoulder: Secondary | ICD-10-CM

## 2024-07-17 DIAGNOSIS — F32A Depression, unspecified: Secondary | ICD-10-CM | POA: Diagnosis not present

## 2024-07-17 DIAGNOSIS — Z794 Long term (current) use of insulin: Secondary | ICD-10-CM | POA: Insufficient documentation

## 2024-07-17 DIAGNOSIS — Z79899 Other long term (current) drug therapy: Secondary | ICD-10-CM | POA: Insufficient documentation

## 2024-07-17 DIAGNOSIS — M7521 Bicipital tendinitis, right shoulder: Secondary | ICD-10-CM

## 2024-07-17 DIAGNOSIS — Z8249 Family history of ischemic heart disease and other diseases of the circulatory system: Secondary | ICD-10-CM | POA: Insufficient documentation

## 2024-07-17 DIAGNOSIS — Z7985 Long-term (current) use of injectable non-insulin antidiabetic drugs: Secondary | ICD-10-CM | POA: Diagnosis not present

## 2024-07-17 DIAGNOSIS — K449 Diaphragmatic hernia without obstruction or gangrene: Secondary | ICD-10-CM | POA: Insufficient documentation

## 2024-07-17 DIAGNOSIS — M797 Fibromyalgia: Secondary | ICD-10-CM | POA: Insufficient documentation

## 2024-07-17 DIAGNOSIS — I358 Other nonrheumatic aortic valve disorders: Secondary | ICD-10-CM | POA: Diagnosis not present

## 2024-07-17 DIAGNOSIS — I10 Essential (primary) hypertension: Secondary | ICD-10-CM | POA: Diagnosis not present

## 2024-07-17 DIAGNOSIS — M65811 Other synovitis and tenosynovitis, right shoulder: Secondary | ICD-10-CM | POA: Insufficient documentation

## 2024-07-17 DIAGNOSIS — N289 Disorder of kidney and ureter, unspecified: Secondary | ICD-10-CM | POA: Insufficient documentation

## 2024-07-17 DIAGNOSIS — M1990 Unspecified osteoarthritis, unspecified site: Secondary | ICD-10-CM | POA: Diagnosis not present

## 2024-07-17 DIAGNOSIS — Z833 Family history of diabetes mellitus: Secondary | ICD-10-CM | POA: Insufficient documentation

## 2024-07-17 DIAGNOSIS — S43431A Superior glenoid labrum lesion of right shoulder, initial encounter: Secondary | ICD-10-CM | POA: Diagnosis not present

## 2024-07-17 DIAGNOSIS — J45909 Unspecified asthma, uncomplicated: Secondary | ICD-10-CM | POA: Diagnosis not present

## 2024-07-17 HISTORY — PX: POSTERIOR LUMBAR FUSION 2 WITH HARDWARE REMOVAL: SHX7297

## 2024-07-17 LAB — GLUCOSE, CAPILLARY
Glucose-Capillary: 120 mg/dL — ABNORMAL HIGH (ref 70–99)
Glucose-Capillary: 112 mg/dL — ABNORMAL HIGH (ref 70–99)
Glucose-Capillary: 98 mg/dL (ref 70–99)

## 2024-07-17 SURGERY — ARTHROSCOPY, SHOULDER WITH DEBRIDEMENT
Anesthesia: Regional | Site: Shoulder | Laterality: Right

## 2024-07-17 MED ORDER — BUPIVACAINE-EPINEPHRINE (PF) 0.5% -1:200000 IJ SOLN
INTRAMUSCULAR | Status: DC | PRN
Start: 2024-07-17 — End: 2024-07-17
  Administered 2024-07-17: 10 mL

## 2024-07-17 MED ORDER — PHENYLEPHRINE HCL-NACL 20-0.9 MG/250ML-% IV SOLN
INTRAVENOUS | Status: DC | PRN
  Administered 2024-07-17: 30 ug/min via INTRAVENOUS

## 2024-07-17 MED ORDER — MIDAZOLAM HCL 2 MG/2ML IJ SOLN
INTRAMUSCULAR | Status: AC
  Administered 2024-07-17: 1 mg via INTRAVENOUS
  Filled 2024-07-17: qty 2

## 2024-07-17 MED ORDER — OXYCODONE HCL 5 MG PO TABS: 5.0000 mg | ORAL_TABLET | ORAL | 0 refills | Status: DC | PRN

## 2024-07-17 MED ORDER — BUPIVACAINE LIPOSOME 1.3 % IJ SUSP
INTRAMUSCULAR | Status: DC | PRN
Start: 2024-07-17 — End: 2024-07-17
  Administered 2024-07-17: 10 mL

## 2024-07-17 MED ORDER — FENTANYL CITRATE (PF) 100 MCG/2ML IJ SOLN: 25.0000 ug | INTRAMUSCULAR | Status: DC | PRN

## 2024-07-17 MED ORDER — FENTANYL CITRATE (PF) 100 MCG/2ML IJ SOLN: 50.0000 ug | Freq: Once | INTRAMUSCULAR | Status: AC

## 2024-07-17 MED ORDER — AMISULPRIDE (ANTIEMETIC) 5 MG/2ML IV SOLN: 10.0000 mg | Freq: Once | INTRAVENOUS | Status: DC | PRN

## 2024-07-17 MED ORDER — ACETAMINOPHEN 10 MG/ML IV SOLN: 1000.0000 mg | Freq: Once | INTRAVENOUS | Status: DC | PRN

## 2024-07-17 MED ORDER — VANCOMYCIN HCL 1000 MG IV SOLR
INTRAVENOUS | Status: AC
  Filled 2024-07-17: qty 20

## 2024-07-17 MED ORDER — ONDANSETRON HCL 4 MG/2ML IJ SOLN: 4.0000 mg | Freq: Once | INTRAMUSCULAR | Status: DC | PRN

## 2024-07-17 MED ORDER — 0.9 % SODIUM CHLORIDE (POUR BTL) OPTIME
TOPICAL | Status: DC | PRN
  Administered 2024-07-17: 1000 mL

## 2024-07-17 MED ORDER — ORAL CARE MOUTH RINSE: 15.0000 mL | Freq: Once | OROMUCOSAL | Status: AC

## 2024-07-17 MED ORDER — LIDOCAINE HCL (CARDIAC) PF 100 MG/5ML IV SOSY
PREFILLED_SYRINGE | INTRAVENOUS | Status: DC | PRN
Start: 2024-07-17 — End: 2024-07-17
  Administered 2024-07-17: 40 mg via INTRATRACHEAL

## 2024-07-17 MED ORDER — CEFAZOLIN SODIUM-DEXTROSE 2-4 GM/100ML-% IV SOLN
2.0000 g | INTRAVENOUS | Status: AC
  Administered 2024-07-17: 2 g via INTRAVENOUS
  Filled 2024-07-17: qty 100

## 2024-07-17 MED ORDER — SUGAMMADEX SODIUM 200 MG/2ML IV SOLN
INTRAVENOUS | Status: DC | PRN
  Administered 2024-07-17: 200 mg via INTRAVENOUS

## 2024-07-17 MED ORDER — SODIUM CHLORIDE (PF) 0.9 % IJ SOLN
INTRAMUSCULAR | Status: DC | PRN
  Administered 2024-07-17 (×2): 3001 mL

## 2024-07-17 MED ORDER — EPINEPHRINE PF 1 MG/ML IJ SOLN
INTRAMUSCULAR | Status: AC
  Filled 2024-07-17: qty 3

## 2024-07-17 MED ORDER — LACTATED RINGERS IV SOLN: INTRAVENOUS | Status: DC

## 2024-07-17 MED ORDER — FENTANYL CITRATE (PF) 100 MCG/2ML IJ SOLN
INTRAMUSCULAR | Status: DC | PRN
  Administered 2024-07-17: 25 ug via INTRAVENOUS

## 2024-07-17 MED ORDER — OXYCODONE HCL 5 MG PO TABS: 5.0000 mg | ORAL_TABLET | Freq: Once | ORAL | Status: DC | PRN

## 2024-07-17 MED ORDER — EPINEPHRINE PF 1 MG/ML IJ SOLN
INTRAMUSCULAR | Status: AC
  Filled 2024-07-17: qty 1

## 2024-07-17 MED ORDER — FENTANYL CITRATE (PF) 100 MCG/2ML IJ SOLN
INTRAMUSCULAR | Status: AC
  Administered 2024-07-17: 50 ug via INTRAVENOUS
  Filled 2024-07-17: qty 2

## 2024-07-17 MED ORDER — PROPOFOL 10 MG/ML IV BOLUS
INTRAVENOUS | Status: DC | PRN
  Administered 2024-07-17: 20 mg via INTRAVENOUS
  Administered 2024-07-17: 50 mg via INTRAVENOUS
  Administered 2024-07-17: 30 mg via INTRAVENOUS
  Administered 2024-07-17: 80 mg via INTRAVENOUS

## 2024-07-17 MED ORDER — PHENYLEPHRINE HCL (PRESSORS) 10 MG/ML IV SOLN
INTRAVENOUS | Status: DC | PRN
  Administered 2024-07-17: 80 ug via INTRAVENOUS
  Administered 2024-07-17: 160 ug via INTRAVENOUS

## 2024-07-17 MED ORDER — ROCURONIUM BROMIDE 100 MG/10ML IV SOLN
INTRAVENOUS | Status: DC | PRN
  Administered 2024-07-17: 50 mg via INTRAVENOUS

## 2024-07-17 MED ORDER — CHLORHEXIDINE GLUCONATE 0.12 % MT SOLN
15.0000 mL | Freq: Once | OROMUCOSAL | Status: AC
Start: 2024-07-17 — End: 2024-07-17
  Administered 2024-07-17: 15 mL via OROMUCOSAL
  Filled 2024-07-17: qty 15

## 2024-07-17 MED ORDER — TRANEXAMIC ACID-NACL 1000-0.7 MG/100ML-% IV SOLN
1000.0000 mg | INTRAVENOUS | Status: AC
  Administered 2024-07-17: 1000 mg via INTRAVENOUS
  Filled 2024-07-17: qty 100

## 2024-07-17 MED ORDER — FENTANYL CITRATE (PF) 100 MCG/2ML IJ SOLN
INTRAMUSCULAR | Status: AC
  Filled 2024-07-17: qty 2

## 2024-07-17 MED ORDER — PHENYLEPHRINE 80 MCG/ML (10ML) SYRINGE FOR IV PUSH (FOR BLOOD PRESSURE SUPPORT): PREFILLED_SYRINGE | INTRAVENOUS | Status: DC | PRN

## 2024-07-17 MED ORDER — OXYCODONE HCL 5 MG/5ML PO SOLN: 5.0000 mg | Freq: Once | ORAL | Status: DC | PRN

## 2024-07-17 MED ORDER — METHOCARBAMOL 500 MG PO TABS: 500.0000 mg | ORAL_TABLET | Freq: Three times a day (TID) | ORAL | 1 refills | Status: DC | PRN

## 2024-07-17 MED ORDER — VANCOMYCIN HCL 1000 MG IV SOLR
INTRAVENOUS | Status: DC | PRN
  Administered 2024-07-17: 1000 mg

## 2024-07-17 MED ORDER — MIDAZOLAM HCL (PF) 2 MG/2ML IJ SOLN: 1.0000 mg | Freq: Once | INTRAMUSCULAR | Status: AC

## 2024-07-17 SURGICAL SUPPLY — 53 items
ANCHOR FBRTK 2.6 SUTURETAP 1.3 (Anchor) IMPLANT
ANCHOR SUT 1.8 FIBERTAK SB KL (Anchor) IMPLANT
ANCHOR SUT SWIVELLOK BIO (Anchor) IMPLANT
ANCHOR SUT SWIVELLOK BIO 7X1 (Anchor) IMPLANT
ANCHOR SWIVELOCK BIO 4.75X19.1 (Anchor) IMPLANT
BAG COUNTER SPONGE SURGICOUNT (BAG) ×2 IMPLANT
BENZOIN TINCTURE PRP APPL 2/3 (GAUZE/BANDAGES/DRESSINGS) ×2 IMPLANT
BLADE SURG 11 STRL SS (BLADE) IMPLANT
BLADE SURG 15 STRL LF DISP TIS (BLADE) IMPLANT
COVER SURGICAL LIGHT HANDLE (MISCELLANEOUS) ×2 IMPLANT
DRAPE INCISE IOBAN 66X45 STRL (DRAPES) ×2 IMPLANT
DRAPE SURG ORHT 6 SPLT 77X108 (DRAPES) ×4 IMPLANT
DRAPE U-SHAPE 47X51 STRL (DRAPES) ×2 IMPLANT
DRSG TEGADERM 4X4.75 (GAUZE/BANDAGES/DRESSINGS) IMPLANT
DRSG XEROFORM 1X8 (GAUZE/BANDAGES/DRESSINGS) IMPLANT
DURAPREP 26ML APPLICATOR (WOUND CARE) ×2 IMPLANT
ELECT CAUTERY BLADE 6.4 (BLADE) ×2 IMPLANT
ELECTRODE REM PT RTRN 9FT ADLT (ELECTROSURGICAL) ×2 IMPLANT
GAUZE PAD ABD 8X10 STRL (GAUZE/BANDAGES/DRESSINGS) ×2 IMPLANT
GAUZE SPONGE 4X4 12PLY STRL (GAUZE/BANDAGES/DRESSINGS) ×2 IMPLANT
GAUZE XEROFORM 1X8 LF (GAUZE/BANDAGES/DRESSINGS) ×2 IMPLANT
GLOVE BIOGEL PI MICRO 8.0 (GLOVE) ×2 IMPLANT
GOWN STRL REUS W/ TWL LRG LVL3 (GOWN DISPOSABLE) ×4 IMPLANT
KIT BASIN OR (CUSTOM PROCEDURE TRAY) ×2 IMPLANT
KIT STR SPEAR 1.8 FBRTK DISP (KITS) IMPLANT
KIT TURNOVER KIT B (KITS) ×2 IMPLANT
MANIFOLD NEPTUNE II (INSTRUMENTS) ×2 IMPLANT
NDL 1/2 CIR CATGUT .05X1.09 (NEEDLE) IMPLANT
NDL HD SCORPION MEGA LOADER (NEEDLE) IMPLANT
NDL HYPO 25GX1X1/2 BEV (NEEDLE) ×2 IMPLANT
NDL SPNL 18GX3.5 QUINCKE PK (NEEDLE) IMPLANT
NEEDLE 1/2 CIR CATGUT .05X1.09 (NEEDLE) IMPLANT
NEEDLE HYPO 25GX1X1/2 BEV (NEEDLE) ×2 IMPLANT
NEEDLE SPNL 18GX3.5 QUINCKE PK (NEEDLE) IMPLANT
PACK SHOULDER (CUSTOM PROCEDURE TRAY) ×2 IMPLANT
PAD ARMBOARD POSITIONER FOAM (MISCELLANEOUS) ×4 IMPLANT
PASSER SUT SWANSON 36MM LOOP (INSTRUMENTS) IMPLANT
SLING ARM IMMOBILIZER MED (SOFTGOODS) IMPLANT
SOLN 0.9% NACL POUR BTL 1000ML (IV SOLUTION) ×2 IMPLANT
SOLN STERILE WATER BTL 1000 ML (IV SOLUTION) ×2 IMPLANT
SPIKE FLUID TRANSFER (MISCELLANEOUS) IMPLANT
SPONGE T-LAP 4X18 ~~LOC~~+RFID (SPONGE) ×4 IMPLANT
STRIP CLOSURE SKIN 1/2X4 (GAUZE/BANDAGES/DRESSINGS) IMPLANT
SUT MNCRL AB 3-0 PS2 27 (SUTURE) IMPLANT
SUT VIC AB 2-0 CT2 27 (SUTURE) ×2 IMPLANT
SUTURE 0 FIBERLP 38 BLU TPR ND (SUTURE) IMPLANT
SUTURE FIBERWR #2 38 T-5 BLUE (SUTURE) ×6 IMPLANT
SYR CONTROL 10ML LL (SYRINGE) ×2 IMPLANT
SYSTEM FBRTK BUTTON 2.6 (Anchor) IMPLANT
TOWEL GREEN STERILE (TOWEL DISPOSABLE) ×2 IMPLANT
TOWEL GREEN STERILE FF (TOWEL DISPOSABLE) ×2 IMPLANT
TRAY FOLEY MTR SLVR 16FR STAT (SET/KITS/TRAYS/PACK) IMPLANT
WAND ABLATOR APOLLO I90 (BUR) IMPLANT

## 2024-07-17 NOTE — Op Note (Unsigned)
 NAMEYOUNG, BRIM MEDICAL RECORD NO: [id number] ACCOUNT NO: [id number] DATE OF BIRTH: 1951-01-20 FACILITY: [facility] LOCATION: [facility] PHYSICIAN: Cordella RAMAN. Addie, MD  Operative Report   DATE OF PROCEDURE: 07/17/2024  PREOPERATIVE DIAGNOSIS:  Right shoulder rotator cuff tear and biceps tendonitis.  POSTOPERATIVE DIAGNOSIS:  Right shoulder rotator cuff tear and biceps tendonitis.  PROCEDURE:  Right shoulder arthroscopy with debridement of slap tear, release of the biceps tendon, and debridement of rotator cuff tear followed by mini open biceps tenodesis and rotator cuff repair of a substantial but unusual rotator cuff tear about  1.5 cm medial to the normal rotator cuff attachment on the greater tuberosity.  SURGEON:  Cordella RAMAN. Addie, MD  ASSISTANT:  Herlene Calix, PA  INDICATIONS:  The patient is a 73 year old female with right shoulder pain.  She has a retracted rotator cuff tear which is essentially a hole in the superior aspect of the rotator cuff.  She presents now for operative management after explanation of  risks and benefits.  PROCEDURE IN DETAIL:  Patient was brought to the operating room where general anesthetic was induced, preoperative antibiotics were administered, timeout was called.  Patient placed in the beach chair position with the head in neutral position.  Right  shoulder, arm, and hand prescrubbed with alcohol and Betadine, allowed to air dry, prepped with DuraPrep solution and draped in a sterile manner.  Ioban used to seal the operative field and cover the axilla.  After timeout was called, a posterior portal  was created, anterior portal created under direct visualization.  There was some synovitis but no frozen shoulder.  Patient did have passive range of motion of the shoulder preoperatively with exam under anesthesia of 60/95/155.  After creation of the  anterior portal, synovitis was debrided.  The patient did have a degenerative slap tear.  This was  debrided with the ArthroCare wand and the biceps tendon was released.  Patient had a significant rotator cuff tear essentially in the middle of the  superior aspect of the rotator cuff and capsule.  This did involve the anterior portion of the infraspinatus as well.  At this time, following debridement, the instruments were removed, portals were closed using 3-0 nylon.  Ioban then used to cover the  entire operative field.  Incision made off the anterolateral margin of the acromion, skin and subcutaneous tissue were sharply divided.  Raphe between the anterior and middle deltoids was identified.  Stay suture placed a measured distance of 4 cm over  that raphe.  The raphe was then entered, a bursectomy performed.  The biceps tendon was tenodesed into the bicipital groove using Arthrex suture anchors x 2.  Bone quality marginal.  Next, the rotator cuff tear was identified.  Acromioplasty performed.   In general, the supraspinatus footprint attachment was intact except for one small 1 mm hole.  Patient did have a hole in the rotator cuff measuring about the size of a quarter.  At this time, 2 Mason-Allen 0 FiberWire sutures were placed in the medial  aspect of the supraspinatus tendon.  Releases performed in the subacromial space as well as of the coracohumeral ligament in order to facilitate mobilization.  This mobilized essentially laterally to fill in the hole.  At this time, 4 additional sutures  were placed but not tied between the remnant rotator cuff laterally and the tongue of tissue medially which was the supraspinatus tendon and part of the infraspinatus.  Next, a single suture tape anchor was placed at the  junction of the greater  tuberosity in the humeral head.  Using a Scorpion suture passer, those 4 limbs were then placed medial to the 0 FiberWire grasping sutures.  Now, at this time, the landing zone was prepared.  Using pulling traction on the 0 FiberWire sutures in the  supraspinatus, the  suture tapes were tied and crossed.  The 4 other 0 FiberWire sutures placed between the medial rotator cuff and the lateral rotator cuff which is still attached were then tied.  Next, the 4 FiberWire suture sutures which were grasping  sutures in the supraspinatus were passed into the hole in the rotator cuff tendon and placed into a SwiveLock laterally.  Bone quality was poor in this area, but we did achieve good fixation with a 6.5 corkscrew corkscrew.  Then we used corkscrews subsequently and  achieved a good fixation after the suture tapes were crossed and the limbs of the 0 Vicryl were crossed.  Overall, a watertight repair was achieved.  Thorough irrigation was then performed and vancomycin  powder placed on top of the construct.  The  deltoid split was then closed using #1 Vicryl suture followed by an interrupted inverted 0 Vicryl suture, 2-0 Vicryl suture, and 3-0 Monocryl with Steri-Strips and impervious dressing and shoulder sling applied.  Patient tolerated procedure well without  immediate complications, transferred to the recovery room in stable condition.  Luke's assistance was required for arthroscopy for retraction, opening, closing, mobilization of tissue.  His assistance was medical necessity.    MUK D: 07/17/2024 3:47:26 pm T: 07/17/2024 9:26:00 pm  JOB: [id number]/ [id number]

## 2024-07-17 NOTE — Anesthesia Procedure Notes (Signed)
 Anesthesia Regional Block: Interscalene brachial plexus block   Pre-Anesthetic Checklist: , timeout performed,  Correct Patient, Correct Site, Correct Laterality,  Correct Procedure, Correct Position, site marked,  Risks and benefits discussed,  At surgeon's request and post-op pain management  Laterality: Upper and Right  Prep: chloraprep       Needles:  Injection technique: Single-shot  Needle Type: Echogenic Stimulator Needle     Needle Length: 9cm  Needle Gauge: 20     Additional Needles:   Procedures:, nerve stimulator,,, ultrasound used (permanent image in chart),, #20gu IV placed    Narrative:  Start time: 07/17/2024 11:45 AM End time: 07/17/2024 11:45 AM Injection made incrementally with aspirations every 5 mL.  Performed by: Personally  Anesthesiologist: Waddell Lauraine NOVAK, MD

## 2024-07-17 NOTE — H&P (Signed)
 [name] is an 73 y.o. female.   Chief Complaint: right shoulder pain HPI: [name] is a 73 year old patient with right shoulder pain longstanding.  Has failed conservative management.  Has slightly retracted rotator cuff tear and not too much in terms of arthritis in the shoulder.  Biceps tendon also appears to have some pathology.  Patient's latest hemoglobin A1c was 6.6.  Past Medical History:  Diagnosis Date   Aortic stenosis    mild on 04/07/19 echo   Arthritis    L knee, hands, back    Asthma    No issues in many years as of 2025   Diabetes mellitus type 2, insulin  dependent (HCC)    Full dentures    Full dentures    GERD (gastroesophageal reflux disease)    H/O hiatal hernia    History of esophageal dilatation    History of rhabdomyolysis    03/ 2015   Hyperlipidemia    Hypertension    Insulin  pump in place    since 12/ 2014--  [employer]   Lumbar disc herniation    right leg weakness   Moderate nonproliferative diabetic retinopathy (HCC) 04/03/2022   Osteoarthritis of left knee, primary localized 08/17/2014   Pneumonia    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   Renal insufficiency    Rhabdomyolysis 11/15/2013   SUI (stress urinary incontinence, female)    Syncope 12/27/2017   Wears glasses    Wears glasses     Past Surgical History:  Procedure Laterality Date   BLADDER SUSPENSION N/A 03/09/2014   Procedure: CYSTOSCOPY/SLING;  Surgeon: Glendia DELENA Elizabeth, MD;  Location: [facility];  Service: Urology;  Laterality: N/A;   CARDIAC CATHETERIZATION  07-20-2009   DR DEBBY SOR   MODERATE LVH/  NORMAL  LVEF/  NORMAL CORONARY AND RENAL ARTERIES   CARPAL TUNNEL RELEASE Right 2000   CHOLECYSTECTOMY  1990   COLONOSCOPY WITH ESOPHAGOGASTRODUODENOSCOPY (EGD)  06-18-2002   ESOPHAGOGASTRODUODENOSCOPY (EGD) WITH ESOPHAGEAL DILATION  06-12-2010   EXCISION LEFT UPPER ARM MASS  01-29-2014   EYE SURGERY  2010   laser on R eye   INTERSTIM IMPLANT REMOVAL N/A  03/06/2019   Procedure: REMOVAL OF RENNA IMPLANT;  Surgeon: Elizabeth Glendia, MD;  Location: [facility];  Service: Urology;  Laterality: N/A;   KNEE ARTHROSCOPY Right 1989  &  2002   LUMBAR LAMINECTOMY N/A 02/19/2020   Procedure: right L4 hemiaminectomy, microdiscectomy;  Surgeon: Barbarann Oneil BROCKS, MD;  Location: [facility] OR;  Service: Orthopedics;  Laterality: N/A;   MULTIPLE TOOTH EXTRACTIONS     NEGATIVE SLEEP STUDY  2012   PER PT   PARTIAL KNEE ARTHROPLASTY Left 08/17/2014   Procedure: LEFT UNICOMPARTMENTAL KNEE;  Surgeon: Fonda SHAUNNA Olmsted, MD;  Location: [facility];  Service: Orthopedics;  Laterality: Left;    Family History  Problem Relation Age of Onset   Diabetes Mother    Hypertension Mother    Lung cancer Mother        smoked   Diabetes Father    Hypertension Father    Lung cancer Father        smoked   Heart failure Sister    Diabetes Brother    Diabetes Maternal Grandmother    Cancer Maternal Grandmother    Social History:  reports that she has never smoked. She has never used smokeless tobacco. She reports that she does not drink alcohol and does not use drugs.  Allergies:  Allergies  Allergen Reactions   Pollen Extract Other (See  Comments)    Congestion/sneezing   Tomato Hives and Itching   Codeine Hives, Itching and Nausea And Vomiting    Medications Prior to Admission  Medication Sig Dispense Refill   amLODipine  (NORVASC ) 10 MG tablet Take 10 mg by mouth daily.     aspirin  EC 81 MG EC tablet Take 1 tablet (81 mg total) by mouth daily. Swallow whole. 30 tablet 11   atorvastatin  (LIPITOR) 10 MG tablet Take 1 tablet (10 mg total) by mouth daily. 90 tablet 3   DULoxetine  (CYMBALTA ) 30 MG capsule TAKE 1 CAPSULE BY MOUTH DAILY 30 capsule 5   Finerenone  (KERENDIA ) 10 MG TABS Take 1 tablet by mouth daily. 90 tablet 2   HUMALOG  100 UNIT/ML injection Inject 0.1 mLs (10 Units total) into the skin 3 (three) times daily with meals. (Patient taking differently: Inject 10 Units into the  skin 2 (two) times daily with a meal.) 10 mL 3   insulin  degludec (TRESIBA  FLEXTOUCH) 100 UNIT/ML FlexTouch Pen Inject 27 Units into the skin daily. (Patient taking differently: Inject 40 Units into the skin daily.) 9 mL 1   linaclotide  (LINZESS ) 145 MCG CAPS capsule Take 1 capsule (145 mcg total) by mouth daily before breakfast. 90 capsule 1   pantoprazole  (PROTONIX ) 40 MG tablet TAKE ONE TABLET BY MOUTH ONCE DAILY 90 tablet 1   pregabalin  (LYRICA ) 150 MG capsule Take 150 mg by mouth in the morning, at noon, and at bedtime.     rosuvastatin (CRESTOR) 20 MG tablet Take 20 mg by mouth daily.     Semaglutide , 2 MG/DOSE, (OZEMPIC , 2 MG/DOSE,) 8 MG/3ML SOPN Inject 2 mg into the skin every 7 (seven) days.     valsartan  (DIOVAN ) 160 MG tablet Take 160 mg by mouth daily.     Vibegron (GEMTESA) 75 MG TABS Take 75 mg by mouth daily.     zolpidem  (AMBIEN ) 5 MG tablet Take 5 mg by mouth at bedtime as needed for sleep.     amLODipine  (NORVASC ) 5 MG tablet Take 1 tablet (5 mg total) by mouth at bedtime. (Patient not taking: Reported on 07/10/2024) 90 tablet 1   blood glucose meter kit and supplies KIT Dispense based on patient and insurance preference. Check blood sugars 4 times daily E11.69 1 each 1   Continuous Blood Gluc Receiver (FREESTYLE LIBRE READER) DEVI 1 each by Does not apply route See admin instructions. CGM device-Freestyle Libre     Continuous Blood Gluc Sensor (FREESTYLE LIBRE 14 DAY SENSOR) MISC 1 each by Does not apply route See admin instructions. CGM 14-day sensors; send refills to [employer] (in Epic)     Insulin  Pen Needle (BD PEN NEEDLE MICRO U/F) 32G X 6 MM MISC DX:E11.65 USE TO CHECK BLOOD SUGARS THREE TIMES A DAY 100 each 6   Lancets Misc. MISC 1 each by Does not apply route 4 (four) times daily -  with meals and at bedtime. 300 each 3   ONETOUCH VERIO test strip USE TO test blood sugar FOUR TIMES DAILY 100 strip 1   pregabalin  (LYRICA ) 50 MG capsule TAKE ONE CAPSULE BY MOUTH  BEFORE BREAKFAST and TAKE ONE CAPSULE BY MOUTH EVERY EVENING and TAKE ONE CAPSULE BY MOUTH EVERYDAY AT BEDTIME (Patient not taking: Reported on 07/10/2024) 90 capsule 5   traMADol  (ULTRAM ) 50 MG tablet Take 1 tablet (50 mg total) by mouth every 6 (six) hours as needed. (Patient not taking: Reported on 07/10/2024) 30 tablet 0   valsartan  (DIOVAN ) 40 MG tablet  Take 1 tablet (40 mg total) by mouth daily. (Patient not taking: Reported on 07/10/2024) 30 tablet 2    Results for orders placed or performed during the hospital encounter of 07/17/24 (from the past 48 hours)  Glucose, capillary     Status: Abnormal   Collection Time: 07/17/24  9:32 AM  Result Value Ref Range   Glucose-Capillary 120 (H) 70 - 99 mg/dL    Comment: Glucose reference range applies only to samples taken after fasting for at least 8 hours.   Comment 1 Notify RN    Comment 2 Document in Chart    No results found.  Review of Systems  Musculoskeletal:  Positive for arthralgias.  All other systems reviewed and are negative.   Blood pressure (!) 157/82, pulse 99, temperature 98.4 F (36.9 C), temperature source Oral, resp. rate 18, height 5' 4 (1.626 m), weight 59.4 kg. Physical Exam Vitals reviewed.  HENT:     Head: Normocephalic.     Nose: Nose normal.     Mouth/Throat:     Mouth: Mucous membranes are moist.  Eyes:     Pupils: Pupils are equal, round, and reactive to light.  Cardiovascular:     Rate and Rhythm: Normal rate.     Pulses: Normal pulses.  Pulmonary:     Effort: Pulmonary effort is normal.  Abdominal:     General: Abdomen is flat.  Musculoskeletal:     Cervical back: Normal range of motion.  Skin:    General: Skin is warm.     Capillary Refill: Capillary refill takes less than 2 seconds.  Neurological:     General: No focal deficit present.     Mental Status: She is alert.  Psychiatric:        Mood and Affect: Mood normal.     Ortho exam demonstrates right shoulder range of motion of  30/70/100.  This is slightly more than she was at last clinic visit.  She does have some coarseness with passive range of motion at 90 degrees of abduction.  Axillary nerve intact with firing of the deltoid.  Active abduction and forward flexion both only to about 45 degrees.  Mild tenderness in the bicipital groove with no Popeye deformity present  Assessment/Plan Impression is right shoulder symptomatic rotator cuff tear.  Plan is right shoulder arthroscopy and debridement with biceps tenodesis and mini open rotator cuff tear repair.  Risk and benefits are discussed with the patient including but limited to infection nerve or vessel damage incomplete pain relief as well as incomplete restoration of function and shoulder stiffness.  Patient understands risk and benefits and wishes to proceed.  Extensive and grueling nature of the rehabilitative process also discussed.  All questions answered.  Will better be able to guide her rehab based on tendon quality at the time of surgery.  KANDICE Glendia Hutchinson, MD 07/17/2024, 10:23 AM

## 2024-07-17 NOTE — Transfer of Care (Signed)
 Immediate Anesthesia Transfer of Care Note  Patient: [name]  Procedure(s) Performed: RIGHT SHOULDER ARTHROSCOPY, DEBRIDEMENT, MINI OPEN BICEP TENDONESIS AND ROTATOR CUFF REPAIR (Right: Shoulder)  Patient Location: PACU  Anesthesia Type:General  Level of Consciousness: awake, alert , and oriented  Airway & Oxygen  Therapy: Patient Spontanous Breathing and Patient connected to nasal cannula oxygen   Post-op Assessment: Report given to RN and Post -op Vital signs reviewed and stable  Post vital signs: Reviewed and stable  Last Vitals:  Vitals Value Taken Time  BP 130/65 07/17/24 15:45  Temp    Pulse 70 07/17/24 15:46  Resp 16 07/17/24 15:46  SpO2 100 % 07/17/24 15:46  Vitals shown include unfiled device data.  Last Pain:  Vitals:   07/17/24 1202  TempSrc:   PainSc: 0-No pain      Patients Stated Pain Goal: 0 (07/17/24 1202)  Complications: No notable events documented.

## 2024-07-17 NOTE — Brief Op Note (Signed)
   07/17/2024  3:40 PM  PATIENT:  [name]  73 y.o. female  PRE-OPERATIVE DIAGNOSIS:  right shoulder rotator cuff tear, biceps tendonitis  POST-OPERATIVE DIAGNOSIS:  right shoulder rotator cuff tear, biceps tendonitis  PROCEDURE:  Procedure(s): RIGHT SHOULDER ARTHROSCOPY, DEBRIDEMENT, MINI OPEN BICEP TENDONESIS AND ROTATOR CUFF REPAIR  SURGEON:  Surgeon(s): Addie, Cordella [place], MD  ASSISTANT: Duwaine PA  ANESTHESIA:   General  EBL: 15 ml    Total I/O In: 1000 [I.V.:1000] Out: -   BLOOD ADMINISTERED: none  DRAINS: None  LOCAL MEDICATIONS USED:  none  SPECIMEN:  No Specimen  COUNTS:  YES  TOURNIQUET:  * No tourniquets in log *  DICTATION: .Other Dictation: Dictation Number [id number]  PLAN OF CARE: Discharge to home after PACU  PATIENT DISPOSITION:  PACU - hemodynamically stable

## 2024-07-18 ENCOUNTER — Other Ambulatory Visit: Payer: Self-pay | Admitting: Orthopaedic Surgery

## 2024-07-18 MED ORDER — ONDANSETRON 4 MG PO TBDP: 4.0000 mg | ORAL_TABLET | Freq: Three times a day (TID) | ORAL | 0 refills | Status: DC | PRN

## 2024-07-18 MED ORDER — HYDROCODONE-ACETAMINOPHEN 5-325 MG PO TABS: 1.0000 | ORAL_TABLET | Freq: Four times a day (QID) | ORAL | 0 refills | Status: DC | PRN

## 2024-07-19 DIAGNOSIS — M65911 Unspecified synovitis and tenosynovitis, right shoulder: Secondary | ICD-10-CM

## 2024-07-19 DIAGNOSIS — M75121 Complete rotator cuff tear or rupture of right shoulder, not specified as traumatic: Secondary | ICD-10-CM

## 2024-07-19 DIAGNOSIS — S43431A Superior glenoid labrum lesion of right shoulder, initial encounter: Secondary | ICD-10-CM

## 2024-07-19 DIAGNOSIS — M7521 Bicipital tendinitis, right shoulder: Secondary | ICD-10-CM

## 2024-07-20 ENCOUNTER — Telehealth: Payer: Self-pay

## 2024-07-20 NOTE — Telephone Encounter (Signed)
 Patient called stating that she is having a reaction to Hydrocodone  and that the Tramadol  is not helping and would like to know if another Rx could be sent to her pharmacy.   Patient had right shoulder surgery on 07/17/2024. CB# [phone].  Please advise.  Thank you.

## 2024-07-21 ENCOUNTER — Encounter (HOSPITAL_COMMUNITY): Payer: Self-pay | Admitting: Orthopedic Surgery

## 2024-07-21 ENCOUNTER — Other Ambulatory Visit: Payer: Self-pay | Admitting: Orthopedic Surgery

## 2024-07-21 MED ORDER — OXYCODONE HCL 5 MG PO TABS: 5.0000 mg | ORAL_TABLET | ORAL | 0 refills | Status: DC | PRN

## 2024-07-21 NOTE — Anesthesia Postprocedure Evaluation (Signed)
 Anesthesia Post Note  Patient: GUYLA BLESS  Procedure(s) Performed: RIGHT SHOULDER ARTHROSCOPY, DEBRIDEMENT, MINI OPEN BICEP TENDONESIS AND ROTATOR CUFF REPAIR (Right: Shoulder)     Patient location during evaluation: PACU Anesthesia Type: Regional Level of consciousness: awake Pain management: pain level controlled Vital Signs Assessment: post-procedure vital signs reviewed and stable Respiratory status: spontaneous breathing Cardiovascular status: blood pressure returned to baseline Postop Assessment: no apparent nausea or vomiting Anesthetic complications: no   No notable events documented.  Last Vitals:  Vitals:   07/17/24 1630 07/17/24 1645  BP: 121/63 126/64  Pulse: 79 77  Resp: 16 15  Temp:  36.4 C  SpO2: (!) 89% 98%    Last Pain:  Vitals:   07/17/24 1645  TempSrc:   PainSc: 0-No pain                 Lauraine KATHEE [place]

## 2024-07-21 NOTE — Telephone Encounter (Signed)
 Talked with patient and advised her message below, per Dr. Addie.

## 2024-07-21 NOTE — Telephone Encounter (Signed)
 Oxy sent pls call thx

## 2024-07-22 ENCOUNTER — Ambulatory Visit (HOSPITAL_BASED_OUTPATIENT_CLINIC_OR_DEPARTMENT_OTHER): Admitting: Cardiovascular Disease

## 2024-07-27 ENCOUNTER — Ambulatory Visit: Admitting: Orthopedic Surgery

## 2024-07-27 DIAGNOSIS — G8929 Other chronic pain: Secondary | ICD-10-CM

## 2024-07-27 DIAGNOSIS — M25511 Pain in right shoulder: Secondary | ICD-10-CM

## 2024-07-28 ENCOUNTER — Encounter: Payer: Self-pay | Admitting: Orthopedic Surgery

## 2024-07-28 NOTE — Progress Notes (Unsigned)
 Post-Op Visit Note   Patient: [name]           Date of Birth: 04/28/1951           MRN: [id number] Visit Date: 07/27/2024 PCP: Haze Kingfisher, MD   Assessment & Plan:  Chief Complaint:  Chief Complaint  Patient presents with   Right Shoulder - Routine Post Op    POST OP/RIGHT SHOULDER SCOPE (surgery date 07-17-24)   Visit Diagnoses:  1. Chronic right shoulder pain     Plan: Patient underwent right shoulder arthroscopy with rotator cuff tear repair of an atypical rotator cuff tear essentially central circular defect in the cuff on 07/17/2024.  Overall she states she is doing well.  She is doing her own exercises which is pendulums.  On exam the incision is intact.  No crepitus with range of motion.  I would like for her to continue doing pendulum exercises only.  Okay to discontinue the sling after 2 more weeks.  4-week return for clinical recheck but no strengthening or lifting until the clinical recheck.  Would like for her to not do any lifting for at least 6 weeks after surgery based on the atypical nature of the rotator cuff tear.  Follow-Up Instructions: No follow-ups on file.   Orders:  No orders of the defined types were placed in this encounter.  No orders of the defined types were placed in this encounter.   Imaging: No results found.  PMFS History: Patient Active Problem List   Diagnosis Date Noted   Synovitis of right shoulder 07/19/2024   Degenerative superior labral anterior-to-posterior (SLAP) tear of right shoulder 07/19/2024   Biceps tendonitis on right 07/19/2024   Nontraumatic complete tear of right rotator cuff 07/19/2024   Moderate nonproliferative diabetic retinopathy (HCC) 04/03/2022   Abdominal bloating 07/03/2021   Colon cancer screening 07/03/2021   Diverticulosis of large intestine without perforation or abscess without bleeding 07/03/2021   Dysphagia 07/03/2021   Flatulence, eructation and gas pain 07/03/2021   Hiatal hernia  07/03/2021   Quadriceps weakness 02/06/2021   Syncope 11/29/2020   Irritable bowel syndrome with diarrhea 10/10/2020   Gastroesophageal reflux disease with esophagitis 10/10/2020   Postlaminectomy syndrome, not elsewhere classified 07/03/2020   S/P lumbar laminectomy 03/30/2020   Localized swelling, mass and lump, multiple sites 03/27/2018   HLD (hyperlipidemia) 12/27/2017   HTN (hypertension) 12/27/2017   CKD (chronic kidney disease), stage III (HCC) 12/27/2017   Osteoarthritis of left knee, primary localized 08/17/2014   Knee osteoarthritis 08/17/2014   Other and unspecified hyperlipidemia 08/06/2013   Type II diabetes mellitus, uncontrolled 07/20/2013   Varicose veins of bilateral lower extremities with other complications 03/19/2013   Fibromyalgia    GERD 05/18/2010   ABDOMINAL PAIN, GENERALIZED 05/02/2010   OBESITY 04/20/2010   BURSITIS, LEFT SHOULDER 03/09/2010   Lipoma of arm s/p excision 01/29/2014 01/10/2010   DEPRESSION 12/27/2009   BACK PAIN WITH RADICULOPATHY 12/27/2009   CYSTITIS, ACUTE 10/21/2009   MICROSCOPIC HEMATURIA 10/18/2009   KNEE PAIN, BILATERAL 10/18/2009   NEUROPATHY 09/08/2009   Asthma 09/08/2009   STRESS INCONTINENCE 09/08/2009   CHEST PAIN 07/19/2009   ESOPHAGEAL STRICTURE 08/27/2005   Past Medical History:  Diagnosis Date   Aortic stenosis    mild on 04/07/19 echo   Arthritis    L knee, hands, back    Asthma    No issues in many years as of 2025   Diabetes mellitus type 2, insulin  dependent (HCC)    Full  dentures    Full dentures    GERD (gastroesophageal reflux disease)    H/O hiatal hernia    History of esophageal dilatation    History of rhabdomyolysis    03/ 2015   Hyperlipidemia    Hypertension    Insulin  pump in place    since 12/ 2014--  [employer]   Lumbar disc herniation    right leg weakness   Moderate nonproliferative diabetic retinopathy (HCC) 04/03/2022   Osteoarthritis of left knee, primary localized 08/17/2014   Pneumonia     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   Renal insufficiency    Rhabdomyolysis 11/15/2013   SUI (stress urinary incontinence, female)    Syncope 12/27/2017   Wears glasses    Wears glasses     Family History  Problem Relation Age of Onset   Diabetes Mother    Hypertension Mother    Lung cancer Mother        smoked   Diabetes Father    Hypertension Father    Lung cancer Father        smoked   Heart failure Sister    Diabetes Brother    Diabetes Maternal Grandmother    Cancer Maternal Grandmother     Past Surgical History:  Procedure Laterality Date   BLADDER SUSPENSION N/A 03/09/2014   Procedure: CYSTOSCOPY/SLING;  Surgeon: Glendia DELENA Elizabeth, MD;  Location: [facility] [place];  Service: Urology;  Laterality: N/A;   CARDIAC CATHETERIZATION  07-20-2009   DR DEBBY SOR   MODERATE LVH/  NORMAL  LVEF/  NORMAL CORONARY AND RENAL ARTERIES   CARPAL TUNNEL RELEASE Right 2000   CHOLECYSTECTOMY  1990   COLONOSCOPY WITH ESOPHAGOGASTRODUODENOSCOPY (EGD)  06-18-2002   ESOPHAGOGASTRODUODENOSCOPY (EGD) WITH ESOPHAGEAL DILATION  06-12-2010   EXCISION LEFT UPPER ARM MASS  01-29-2014   EYE SURGERY  2010   laser on R eye   INTERSTIM IMPLANT REMOVAL N/A 03/06/2019   Procedure: REMOVAL OF RENNA IMPLANT;  Surgeon: Elizabeth Glendia, MD;  Location: [facility];  Service: Urology;  Laterality: N/A;   KNEE ARTHROSCOPY Right 1989  &  2002   LUMBAR LAMINECTOMY N/A 02/19/2020   Procedure: right L4 hemiaminectomy, microdiscectomy;  Surgeon: Barbarann Oneil BROCKS, MD;  Location: [facility] OR;  Service: Orthopedics;  Laterality: N/A;   MULTIPLE TOOTH EXTRACTIONS     NEGATIVE SLEEP STUDY  2012   PER PT   PARTIAL KNEE ARTHROPLASTY Left 08/17/2014   Procedure: LEFT UNICOMPARTMENTAL KNEE;  Surgeon: Fonda SHAUNNA Olmsted, MD;  Location: [facility];  Service: Orthopedics;  Laterality: Left;   POSTERIOR LUMBAR FUSION 2 WITH HARDWARE REMOVAL Right 07/17/2024   Procedure: RIGHT SHOULDER ARTHROSCOPY, DEBRIDEMENT, MINI OPEN BICEP TENDONESIS  AND ROTATOR CUFF REPAIR;  Surgeon: Addie Cordella Glendia, MD;  Location: [facility];  Service: Orthopedics;  Laterality: Right;   Social History   Occupational History   Occupation: disability  Tobacco Use   Smoking status: Never   Smokeless tobacco: Never  Vaping Use   Vaping status: Never Used  Substance and Sexual Activity   Alcohol use: No   Drug use: No   Sexual activity: Not Currently

## 2024-08-26 ENCOUNTER — Encounter: Payer: Self-pay | Admitting: Orthopedic Surgery

## 2024-08-26 ENCOUNTER — Ambulatory Visit: Admitting: Orthopedic Surgery

## 2024-08-26 DIAGNOSIS — M25511 Pain in right shoulder: Secondary | ICD-10-CM

## 2024-08-26 DIAGNOSIS — G8929 Other chronic pain: Secondary | ICD-10-CM

## 2024-08-26 NOTE — Progress Notes (Unsigned)
 "  Post-Op Visit Note   Patient: [name]           Date of Birth: Jan 02, 1951           MRN: [id number] Visit Date: 08/26/2024 PCP: Haze Kingfisher, MD   Assessment & Plan:  Chief Complaint:  Chief Complaint  Patient presents with   Right Shoulder - Follow-up, Weakness    Limited range of motion, feels swollen    Visit Diagnoses:  1. Chronic right shoulder pain     Plan: [name] is now about 6 weeks out right shoulder arthroscopy with rotator cuff tear repair of an atypical central circular rotator cuff tear superiorly.  Overall she is feeling better.  Doing her own physical therapy.  On exam her range of motion is 45/80/110.  No coarseness or popping with passive range of motion.  She wants to continue to work on stretching which I encouraged.  Also okay to start doing some below shoulder level rotator cuff strengthening exercises.  She will follow-up in 6 weeks for final check.  Cautioned her against lifting anything with her extended arms away from her body.  Follow-Up Instructions: No follow-ups on file.   Orders:  No orders of the defined types were placed in this encounter.  No orders of the defined types were placed in this encounter.   Imaging: No results found.  PMFS History: Patient Active Problem List   Diagnosis Date Noted   Synovitis of right shoulder 07/19/2024   Degenerative superior labral anterior-to-posterior (SLAP) tear of right shoulder 07/19/2024   Biceps tendonitis on right 07/19/2024   Nontraumatic complete tear of right rotator cuff 07/19/2024   Moderate nonproliferative diabetic retinopathy (HCC) 04/03/2022   Abdominal bloating 07/03/2021   Colon cancer screening 07/03/2021   Diverticulosis of large intestine without perforation or abscess without bleeding 07/03/2021   Dysphagia 07/03/2021   Flatulence, eructation and gas pain 07/03/2021   Hiatal hernia 07/03/2021   Quadriceps weakness 02/06/2021   Syncope 11/29/2020    Irritable bowel syndrome with diarrhea 10/10/2020   Gastroesophageal reflux disease with esophagitis 10/10/2020   Postlaminectomy syndrome, not elsewhere classified 07/03/2020   S/P lumbar laminectomy 03/30/2020   Localized swelling, mass and lump, multiple sites 03/27/2018   HLD (hyperlipidemia) 12/27/2017   HTN (hypertension) 12/27/2017   CKD (chronic kidney disease), stage III (HCC) 12/27/2017   Osteoarthritis of left knee, primary localized 08/17/2014   Knee osteoarthritis 08/17/2014   Other and unspecified hyperlipidemia 08/06/2013   Type II diabetes mellitus, uncontrolled 07/20/2013   Varicose veins of bilateral lower extremities with other complications 03/19/2013   Fibromyalgia    GERD 05/18/2010   ABDOMINAL PAIN, GENERALIZED 05/02/2010   OBESITY 04/20/2010   BURSITIS, LEFT SHOULDER 03/09/2010   Lipoma of arm s/p excision 01/29/2014 01/10/2010   DEPRESSION 12/27/2009   BACK PAIN WITH RADICULOPATHY 12/27/2009   CYSTITIS, ACUTE 10/21/2009   MICROSCOPIC HEMATURIA 10/18/2009   KNEE PAIN, BILATERAL 10/18/2009   NEUROPATHY 09/08/2009   Asthma 09/08/2009   STRESS INCONTINENCE 09/08/2009   CHEST PAIN 07/19/2009   ESOPHAGEAL STRICTURE 08/27/2005   Past Medical History:  Diagnosis Date   Aortic stenosis    mild on 04/07/19 echo   Arthritis    L knee, hands, back    Asthma    No issues in many years as of 2025   Diabetes mellitus type 2, insulin  dependent (HCC)    Full dentures    Full dentures    GERD (gastroesophageal reflux disease)  H/O hiatal hernia    History of esophageal dilatation    History of rhabdomyolysis    03/ 2015   Hyperlipidemia    Hypertension    Insulin  pump in place    since 12/ 2014--  [employer]   Lumbar disc herniation    right leg weakness   Moderate nonproliferative diabetic retinopathy (HCC) 04/03/2022   Osteoarthritis of left knee, primary localized 08/17/2014   Pneumonia    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   Renal insufficiency     Rhabdomyolysis 11/15/2013   SUI (stress urinary incontinence, female)    Syncope 12/27/2017   Wears glasses    Wears glasses     Family History  Problem Relation Age of Onset   Diabetes Mother    Hypertension Mother    Lung cancer Mother        smoked   Diabetes Father    Hypertension Father    Lung cancer Father        smoked   Heart failure Sister    Diabetes Brother    Diabetes Maternal Grandmother    Cancer Maternal Grandmother     Past Surgical History:  Procedure Laterality Date   BLADDER SUSPENSION N/A 03/09/2014   Procedure: CYSTOSCOPY/SLING;  Surgeon: Glendia DELENA Elizabeth, MD;  Location: [facility];  Service: Urology;  Laterality: N/A;   CARDIAC CATHETERIZATION  07-20-2009   DR DEBBY SOR   MODERATE LVH/  NORMAL  LVEF/  NORMAL CORONARY AND RENAL ARTERIES   CARPAL TUNNEL RELEASE Right 2000   CHOLECYSTECTOMY  1990   COLONOSCOPY WITH ESOPHAGOGASTRODUODENOSCOPY (EGD)  06-18-2002   ESOPHAGOGASTRODUODENOSCOPY (EGD) WITH ESOPHAGEAL DILATION  06-12-2010   EXCISION LEFT UPPER ARM MASS  01-29-2014   EYE SURGERY  2010   laser on R eye   INTERSTIM IMPLANT REMOVAL N/A 03/06/2019   Procedure: REMOVAL OF RENNA IMPLANT;  Surgeon: Elizabeth Glendia, MD;  Location: [facility];  Service: Urology;  Laterality: N/A;   KNEE ARTHROSCOPY Right 1989  &  2002   LUMBAR LAMINECTOMY N/A 02/19/2020   Procedure: right L4 hemiaminectomy, microdiscectomy;  Surgeon: Barbarann Oneil BROCKS, MD;  Location: [facility];  Service: Orthopedics;  Laterality: N/A;   MULTIPLE TOOTH EXTRACTIONS     NEGATIVE SLEEP STUDY  2012   PER PT   PARTIAL KNEE ARTHROPLASTY Left 08/17/2014   Procedure: LEFT UNICOMPARTMENTAL KNEE;  Surgeon: Fonda SHAUNNA Olmsted, MD;  Location: [facility];  Service: Orthopedics;  Laterality: Left;   POSTERIOR LUMBAR FUSION 2 WITH HARDWARE REMOVAL Right 07/17/2024   Procedure: RIGHT SHOULDER ARTHROSCOPY, DEBRIDEMENT, MINI OPEN BICEP TENDONESIS AND ROTATOR CUFF REPAIR;  Surgeon: Addie Cordella Glendia,  MD;  Location: [facility];  Service: Orthopedics;  Laterality: Right;   Social History   Occupational History   Occupation: disability  Tobacco Use   Smoking status: Never   Smokeless tobacco: Never  Vaping Use   Vaping status: Never Used  Substance and Sexual Activity   Alcohol use: No   Drug use: No   Sexual activity: Not Currently     "

## 2024-09-01 ENCOUNTER — Encounter (HOSPITAL_COMMUNITY): Payer: Self-pay | Admitting: Orthopedic Surgery

## 2024-09-03 ENCOUNTER — Encounter (HOSPITAL_COMMUNITY): Payer: Self-pay | Admitting: Emergency Medicine

## 2024-09-03 ENCOUNTER — Inpatient Hospital Stay (HOSPITAL_COMMUNITY)
Admission: EM | Admit: 2024-09-03 | Discharge: 2024-09-06 | DRG: 392 | Disposition: A | Discharge status: Home Health | Attending: Internal Medicine | Admitting: Internal Medicine

## 2024-09-03 ENCOUNTER — Emergency Department (HOSPITAL_COMMUNITY)

## 2024-09-03 ENCOUNTER — Other Ambulatory Visit: Payer: Self-pay

## 2024-09-03 DIAGNOSIS — E1165 Type 2 diabetes mellitus with hyperglycemia: Secondary | ICD-10-CM

## 2024-09-03 DIAGNOSIS — Z794 Long term (current) use of insulin: Secondary | ICD-10-CM

## 2024-09-03 DIAGNOSIS — Z79899 Other long term (current) drug therapy: Secondary | ICD-10-CM

## 2024-09-03 DIAGNOSIS — Z9641 Presence of insulin pump (external) (internal): Secondary | ICD-10-CM | POA: Diagnosis present

## 2024-09-03 DIAGNOSIS — Z7982 Long term (current) use of aspirin: Secondary | ICD-10-CM

## 2024-09-03 DIAGNOSIS — R112 Nausea with vomiting, unspecified: Secondary | ICD-10-CM | POA: Diagnosis present

## 2024-09-03 DIAGNOSIS — Z8679 Personal history of other diseases of the circulatory system: Secondary | ICD-10-CM

## 2024-09-03 DIAGNOSIS — Z9049 Acquired absence of other specified parts of digestive tract: Secondary | ICD-10-CM

## 2024-09-03 DIAGNOSIS — E119 Type 2 diabetes mellitus without complications: Secondary | ICD-10-CM

## 2024-09-03 DIAGNOSIS — E876 Hypokalemia: Secondary | ICD-10-CM | POA: Diagnosis present

## 2024-09-03 DIAGNOSIS — K5900 Constipation, unspecified: Principal | ICD-10-CM | POA: Diagnosis present

## 2024-09-03 DIAGNOSIS — I10 Essential (primary) hypertension: Secondary | ICD-10-CM | POA: Diagnosis present

## 2024-09-03 DIAGNOSIS — Z96652 Presence of left artificial knee joint: Secondary | ICD-10-CM | POA: Diagnosis present

## 2024-09-03 DIAGNOSIS — E1142 Type 2 diabetes mellitus with diabetic polyneuropathy: Secondary | ICD-10-CM | POA: Diagnosis present

## 2024-09-03 DIAGNOSIS — E113399 Type 2 diabetes mellitus with moderate nonproliferative diabetic retinopathy without macular edema, unspecified eye: Secondary | ICD-10-CM | POA: Diagnosis present

## 2024-09-03 DIAGNOSIS — R739 Hyperglycemia, unspecified: Secondary | ICD-10-CM

## 2024-09-03 DIAGNOSIS — E785 Hyperlipidemia, unspecified: Secondary | ICD-10-CM | POA: Diagnosis present

## 2024-09-03 DIAGNOSIS — I350 Nonrheumatic aortic (valve) stenosis: Secondary | ICD-10-CM | POA: Diagnosis present

## 2024-09-03 DIAGNOSIS — K219 Gastro-esophageal reflux disease without esophagitis: Secondary | ICD-10-CM | POA: Diagnosis present

## 2024-09-03 DIAGNOSIS — R1110 Vomiting, unspecified: Principal | ICD-10-CM

## 2024-09-03 DIAGNOSIS — Z8249 Family history of ischemic heart disease and other diseases of the circulatory system: Secondary | ICD-10-CM

## 2024-09-03 DIAGNOSIS — Z801 Family history of malignant neoplasm of trachea, bronchus and lung: Secondary | ICD-10-CM

## 2024-09-03 DIAGNOSIS — Z833 Family history of diabetes mellitus: Secondary | ICD-10-CM

## 2024-09-03 LAB — COMPREHENSIVE METABOLIC PANEL WITH GFR
ALT: 11 U/L (ref 0–44)
AST: 20 U/L (ref 15–41)
Albumin: 4 g/dL (ref 3.5–5.0)
Alkaline Phosphatase: 90 U/L (ref 38–126)
Anion gap: 12 (ref 5–15)
BUN: 13 mg/dL (ref 8–23)
CO2: 27 mmol/L (ref 22–32)
Calcium: 9.4 mg/dL (ref 8.9–10.3)
Chloride: 100 mmol/L (ref 98–111)
Creatinine, Ser: 0.96 mg/dL (ref 0.44–1.00)
GFR, Estimated: >60 mL/min
Glucose, Bld: 245 mg/dL — ABNORMAL HIGH (ref 70–99)
Potassium: 3.2 mmol/L — ABNORMAL LOW (ref 3.5–5.1)
Sodium: 140 mmol/L (ref 135–145)
Total Bilirubin: 0.5 mg/dL (ref 0.0–1.2)
Total Protein: 7.4 g/dL (ref 6.5–8.1)

## 2024-09-03 LAB — CBG MONITORING, ED: Glucose-Capillary: 200 mg/dL — ABNORMAL HIGH (ref 70–99)

## 2024-09-03 LAB — I-STAT VENOUS BLOOD GAS, ED
Acid-Base Excess: 5 mmol/L — ABNORMAL HIGH (ref 0.0–2.0)
Bicarbonate: 29.8 mmol/L — ABNORMAL HIGH (ref 20.0–28.0)
Calcium, Ion: 1.09 mmol/L — ABNORMAL LOW (ref 1.15–1.40)
HCT: 42 % (ref 36.0–46.0)
Hemoglobin: 14.3 g/dL (ref 12.0–15.0)
O2 Saturation: 99 %
Potassium: 3.2 mmol/L — ABNORMAL LOW (ref 3.5–5.1)
Sodium: 139 mmol/L (ref 135–145)
TCO2: 31 mmol/L (ref 22–32)
pCO2, Ven: 45 mmHg (ref 44–60)
pH, Ven: 7.428 (ref 7.25–7.43)
pO2, Ven: 121 mmHg — ABNORMAL HIGH (ref 32–45)

## 2024-09-03 LAB — CBC
HCT: 41.8 % (ref 36.0–46.0)
Hemoglobin: 13.7 g/dL (ref 12.0–15.0)
MCH: 30.2 pg (ref 26.0–34.0)
MCHC: 32.8 g/dL (ref 30.0–36.0)
MCV: 92.1 fL (ref 80.0–100.0)
Platelets: 253 K/uL (ref 150–400)
RBC: 4.54 MIL/uL (ref 3.87–5.11)
RDW: 12.6 % (ref 11.5–15.5)
WBC: 7 K/uL (ref 4.0–10.5)
nRBC: 0 % (ref 0.0–0.2)

## 2024-09-03 LAB — LIPASE, BLOOD: Lipase: 38 U/L (ref 11–51)

## 2024-09-03 MED ORDER — PROCHLORPERAZINE EDISYLATE 10 MG/2ML IJ SOLN
10.0000 mg | Freq: Once | INTRAMUSCULAR | Status: AC
  Administered 2024-09-03: 10 mg via INTRAVENOUS
  Filled 2024-09-03: qty 2

## 2024-09-03 MED ORDER — METOCLOPRAMIDE HCL 5 MG/ML IJ SOLN
10.0000 mg | Freq: Once | INTRAMUSCULAR | Status: AC
  Administered 2024-09-03: 10 mg via INTRAVENOUS
  Filled 2024-09-03: qty 2

## 2024-09-03 MED ORDER — LABETALOL HCL 5 MG/ML IV SOLN
10.0000 mg | Freq: Once | INTRAVENOUS | Status: AC
  Administered 2024-09-03: 10 mg via INTRAVENOUS
  Filled 2024-09-03: qty 4

## 2024-09-03 MED ORDER — SODIUM CHLORIDE 0.9 % IV BOLUS
500.0000 mL | Freq: Once | INTRAVENOUS | Status: AC
  Administered 2024-09-03: 500 mL via INTRAVENOUS

## 2024-09-03 MED ORDER — DROPERIDOL 2.5 MG/ML IJ SOLN
1.2500 mg | Freq: Once | INTRAMUSCULAR | Status: AC
  Administered 2024-09-03: 1.25 mg via INTRAVENOUS
  Filled 2024-09-03: qty 2

## 2024-09-03 MED ORDER — POTASSIUM CHLORIDE 10 MEQ/100ML IV SOLN
10.0000 meq | INTRAVENOUS | Status: AC
  Administered 2024-09-03 – 2024-09-04 (×4): 10 meq via INTRAVENOUS
  Filled 2024-09-03 (×3): qty 100

## 2024-09-03 MED ORDER — IOHEXOL 350 MG/ML SOLN
75.0000 mL | Freq: Once | INTRAVENOUS | Status: AC | PRN
  Administered 2024-09-03: 75 mL via INTRAVENOUS

## 2024-09-03 NOTE — ED Provider Notes (Signed)
 " [facility] Provider Note   CSN: [id number] Arrival date & time: 09/03/24  2024     Patient presents with: Emesis   [name] is a 74 y.o. female.   The patient presents with persistent nausea and vomiting that began acutely this morning.  She has been unable to keep down food or liquids. She notes a burning sensation in her throat after vomiting and no other pain. She feels clammy without chills and has vomiting without diarrhea. Denies substance use. Denies diarrhea. Prior cholecystectomy. No abdominal pain or fullness.   She has type 2 diabetes treated with Tresiba  and hypertension treated with amlodipine  and valsartan . Was unable to take blood pressure medications today due to inability to tolerate PO. She received Zofran  with EMS without relief.    Emesis Associated symptoms: no cough, no diarrhea and no fever        Prior to Admission medications  Medication Sig Start Date End Date Taking? Authorizing Provider  amLODipine  (NORVASC ) 10 MG tablet Take 10 mg by mouth daily.   Yes [provider]  aspirin  EC 81 MG EC tablet Take 1 tablet (81 mg total) by mouth daily. Swallow whole. 12/01/20   Regalado, Belkys A, MD  atorvastatin  (LIPITOR) 10 MG tablet Take 1 tablet (10 mg total) by mouth daily. 12/29/21   Georgina Speaks, FNP  blood glucose meter kit and supplies KIT Dispense based on patient and insurance preference. Check blood sugars 4 times daily E11.69 12/29/20   Georgina Speaks, FNP  Continuous Blood Gluc Receiver (FREESTYLE LIBRE READER) DEVI 1 each by Does not apply route See admin instructions. CGM device-Freestyle [employer], Historical, MD  Continuous Blood Gluc Sensor (FREESTYLE LIBRE 14 DAY SENSOR) MISC 1 each by Does not apply route See admin instructions. CGM 14-day sensors; send refills to [facility] (in [employer])    [provider]  DULoxetine  (CYMBALTA ) 30 MG capsule TAKE 1 CAPSULE BY MOUTH  DAILY 07/30/23   Patel, Donika K, DO  Finerenone  (KERENDIA ) 10 MG TABS Take 1 tablet by mouth daily. 01/19/22   Georgina Speaks, FNP  HUMALOG  100 UNIT/ML injection Inject 0.1 mLs (10 Units total) into the skin 3 (three) times daily with meals. Patient taking differently: Inject 10 Units into the skin 2 (two) times daily with a meal. 05/10/21   Georgina Speaks, FNP  HYDROcodone -acetaminophen  (NORCO/VICODIN) 5-325 MG tablet Take 1-2 tablets by mouth every 6 (six) hours as needed for moderate pain (pain score 4-6). 07/18/24   Vernetta Lonni GRADE, MD  insulin  degludec (TRESIBA  FLEXTOUCH) 100 UNIT/ML FlexTouch Pen Inject 27 Units into the skin daily. Patient taking differently: Inject 40 Units into the skin daily. 08/28/21   Georgina Speaks, FNP  Insulin  Pen Needle (BD PEN NEEDLE MICRO U/F) 32G X 6 MM MISC DX:E11.65 USE TO CHECK BLOOD SUGARS THREE TIMES A DAY 05/12/20   Jarold Medici, MD  Lancets Misc. MISC 1 each by Does not apply route 4 (four) times daily -  with meals and at bedtime. 12/16/18   Georgina Speaks, FNP  linaclotide  (LINZESS ) 145 MCG CAPS capsule Take 1 capsule (145 mcg total) by mouth daily before breakfast. 12/19/21   Georgina Speaks, FNP  methocarbamol  (ROBAXIN ) 500 MG tablet Take 1 tablet (500 mg total) by mouth every 8 (eight) hours as needed for muscle spasms. 07/17/24   Magnant, Carlin CROME, PA-C  ondansetron  (ZOFRAN -ODT) 4 MG disintegrating tablet Take 1 tablet (4 mg total) by mouth  every 8 (eight) hours as needed. 07/18/24   Vernetta Lonni GRADE, MD  [facility] VERIO test strip USE TO test blood sugar FOUR TIMES DAILY 02/15/21   Georgina Speaks, FNP  oxyCODONE  (OXY IR/ROXICODONE ) 5 MG immediate release tablet Take 1 tablet (5 mg total) by mouth every 4 (four) hours as needed for severe pain (pain score 7-10). 07/21/24   Addie Cordella Hamilton, MD  pantoprazole  (PROTONIX ) 40 MG tablet TAKE ONE TABLET BY MOUTH ONCE DAILY 12/25/21   Moore, Janece, FNP  pregabalin  (LYRICA ) 150 MG capsule Take 150 mg by  mouth in the morning, at noon, and at bedtime.    [provider]  pregabalin  (LYRICA ) 50 MG capsule TAKE ONE CAPSULE BY MOUTH BEFORE BREAKFAST and TAKE ONE CAPSULE BY MOUTH EVERY EVENING and TAKE ONE CAPSULE BY MOUTH EVERYDAY AT BEDTIME Patient not taking: Reported on 08/26/2024 10/25/21   Georgina Speaks, FNP  rosuvastatin (CRESTOR) 20 MG tablet Take 20 mg by mouth daily.    [provider]  Semaglutide , 2 MG/DOSE, (OZEMPIC , 2 MG/DOSE,) 8 MG/3ML SOPN Inject 2 mg into the skin every 7 (seven) days.    [provider]  valsartan  (DIOVAN ) 160 MG tablet Take 160 mg by mouth daily.    [provider]  valsartan  (DIOVAN ) 40 MG tablet Take 1 tablet (40 mg total) by mouth daily. Patient not taking: Reported on 08/26/2024 04/18/22 07/06/24  Moore, Janece, FNP  Vibegron (GEMTESA) 75 MG TABS Take 75 mg by mouth daily.    [provider]  zolpidem  (AMBIEN ) 5 MG tablet Take 5 mg by mouth at bedtime as needed for sleep. 02/07/24   [provider]    Allergies: Pollen extract, Tomato, and Codeine    Review of Systems  Constitutional:  Positive for fatigue. Negative for appetite change and fever.  Respiratory:  Negative for cough and shortness of breath.   Gastrointestinal:  Positive for nausea and vomiting. Negative for abdominal distention and diarrhea.  Genitourinary:  Negative for difficulty urinating.    Updated Vital Signs BP (!) 171/91   Pulse 86   Temp 97.8 F (36.6 C) (Oral)   Resp 16   Ht 5' 4 (1.626 m)   Wt 60 kg   LMP  (LMP Unknown)   SpO2 100%   BMI 22.71 kg/m   Physical Exam Constitutional:      Appearance: She is ill-appearing.  Cardiovascular:     Rate and Rhythm: Normal rate and regular rhythm.     Pulses: Normal pulses.     Heart sounds: Normal heart sounds.  Pulmonary:     Effort: Pulmonary effort is normal.     Breath sounds: Normal breath sounds.  Abdominal:     General: Abdomen is flat. Bowel sounds are normal.  There is no distension.     Palpations: Abdomen is soft. There is no mass.     Tenderness: There is no abdominal tenderness. There is no guarding or rebound.  Skin:    Capillary Refill: Capillary refill takes 2 to 3 seconds.  Neurological:     General: No focal deficit present.     Mental Status: She is alert and oriented to person, place, and time. Mental status is at baseline.     (all labs ordered are listed, but only abnormal results are displayed) Labs Reviewed  COMPREHENSIVE METABOLIC PANEL WITH GFR - Abnormal; Notable for the following components:      Result Value   Potassium 3.2 (*)    Glucose, Bld  245 (*)    All other components within normal limits  I-STAT VENOUS BLOOD GAS, ED - Abnormal; Notable for the following components:   pO2, Ven 121 (*)    Bicarbonate 29.8 (*)    Acid-Base Excess 5.0 (*)    Potassium 3.2 (*)    Calcium , Ion 1.09 (*)    All other components within normal limits  CBG MONITORING, ED - Abnormal; Notable for the following components:   Glucose-Capillary 200 (*)    All other components within normal limits  LIPASE, BLOOD  CBC  URINALYSIS, ROUTINE W REFLEX MICROSCOPIC    EKG: EKG Interpretation Date/Time:  Thursday September 03 2024 20:34:10 EST Ventricular Rate:  91 PR Interval:  164 QRS Duration:  76 QT Interval:  384 QTC Calculation: 472 R Axis:   68  Text Interpretation: Normal sinus rhythm Septal infarct , age undetermined Abnormal ECG When compared with ECG of 06-Jul-2024 09:25, No significant change since last tracing Confirmed by Dreama Longs ([id number]) on 09/03/2024 9:36:30 PM  Radiology: No results found.   Procedures   Medications Ordered in the ED  potassium chloride  10 mEq in 100 mL IVPB (10 mEq Intravenous New Bag/Given 09/03/24 2304)  sodium chloride  0.9 % bolus 500 mL (0 mLs Intravenous Stopped 09/03/24 2302)  metoCLOPramide  (REGLAN ) injection 10 mg (10 mg Intravenous Given 09/03/24 2103)  prochlorperazine  (COMPAZINE )  injection 10 mg (10 mg Intravenous Given 09/03/24 2146)  labetalol  (NORMODYNE ) injection 10 mg (10 mg Intravenous Given 09/03/24 2149)  droperidol  (INAPSINE ) 2.5 MG/ML injection 1.25 mg (1.25 mg Intravenous Given 09/03/24 2307)                                    Medical Decision Making LATIMA HAMZA is a 74 y.o. female presenting for acute onset vomiting. On presentation BP noted to be elevated to 190s systolic. Labetalol  was given x1 for hypertension control. Labs significant for K 3.2 consistent with GI losses. Ordered 40 mEq IV K repletion. She received Reglan , compazine  and droperidol  for nausea. CT AP ordered to rule out SBO, acute pancreatitis, and less likely appendicitis. Less likely to be DKA/HSS as she does not have an anion gap or acidosis. Considering acute viral gastroenteritis if imaging negative due to acute symptom onset. Patient denies substance abuse or withdrawal.   At time of sign out CT AP pending. If she is unable to tolerate PO may need admission for fluid resuscitation and electrolyte repletion.   Amount and/or Complexity of Data Reviewed Labs: ordered. Radiology: ordered.  Risk Prescription drug management.       Final diagnoses:  Uncontrollable vomiting    ED Discharge Orders     None          Cleotilde Perkins, DO 09/03/24 2315    Dreama Longs, MD 09/04/24 1252  "

## 2024-09-03 NOTE — ED Triage Notes (Signed)
 Pt arrive by [facility] from home for c/o nausea and vomiting that started today, denies any pain, no fever or chills, seen on UC today for same, was test for labs and resp panel with no results. Cbg 247, HR 100, 205/97, 97% RA. Constipation for a few weeks. 4 mg Zofran  given by EMS pta.

## 2024-09-04 ENCOUNTER — Encounter (HOSPITAL_COMMUNITY): Payer: Self-pay | Admitting: Internal Medicine

## 2024-09-04 ENCOUNTER — Observation Stay (HOSPITAL_COMMUNITY)

## 2024-09-04 DIAGNOSIS — R112 Nausea with vomiting, unspecified: Secondary | ICD-10-CM | POA: Diagnosis not present

## 2024-09-04 DIAGNOSIS — E876 Hypokalemia: Secondary | ICD-10-CM

## 2024-09-04 DIAGNOSIS — E1142 Type 2 diabetes mellitus with diabetic polyneuropathy: Secondary | ICD-10-CM | POA: Diagnosis not present

## 2024-09-04 DIAGNOSIS — Z794 Long term (current) use of insulin: Secondary | ICD-10-CM | POA: Diagnosis not present

## 2024-09-04 DIAGNOSIS — K219 Gastro-esophageal reflux disease without esophagitis: Secondary | ICD-10-CM | POA: Diagnosis not present

## 2024-09-04 DIAGNOSIS — Z8679 Personal history of other diseases of the circulatory system: Secondary | ICD-10-CM | POA: Diagnosis not present

## 2024-09-04 LAB — CBC WITH DIFFERENTIAL/PLATELET
Abs Immature Granulocytes: 0.03 K/uL (ref 0.00–0.07)
Basophils Absolute: 0 K/uL (ref 0.0–0.1)
Basophils Relative: 0 %
Eosinophils Absolute: 0 K/uL (ref 0.0–0.5)
Eosinophils Relative: 0 %
HCT: 45.9 % (ref 36.0–46.0)
Hemoglobin: 14.6 g/dL (ref 12.0–15.0)
Immature Granulocytes: 0 %
Lymphocytes Relative: 7 %
Lymphs Abs: 0.7 K/uL (ref 0.7–4.0)
MCH: 30.1 pg (ref 26.0–34.0)
MCHC: 31.8 g/dL (ref 30.0–36.0)
MCV: 94.6 fL (ref 80.0–100.0)
Monocytes Absolute: 0.2 K/uL (ref 0.1–1.0)
Monocytes Relative: 2 %
Neutro Abs: 8.1 K/uL — ABNORMAL HIGH (ref 1.7–7.7)
Neutrophils Relative %: 91 %
Platelets: 244 K/uL (ref 150–400)
RBC: 4.85 MIL/uL (ref 3.87–5.11)
RDW: 12.8 % (ref 11.5–15.5)
WBC: 9 K/uL (ref 4.0–10.5)
nRBC: 0 % (ref 0.0–0.2)

## 2024-09-04 LAB — URINE DRUG SCREEN
Amphetamines: NEGATIVE
Barbiturates: NEGATIVE
Benzodiazepines: NEGATIVE
Cocaine: NEGATIVE
Fentanyl: NEGATIVE
Methadone Scn, Ur: NEGATIVE
Opiates: NEGATIVE
Tetrahydrocannabinol: NEGATIVE

## 2024-09-04 LAB — COMPREHENSIVE METABOLIC PANEL WITH GFR
ALT: 14 U/L (ref 0–44)
AST: 24 U/L (ref 15–41)
Albumin: 3.6 g/dL (ref 3.5–5.0)
Alkaline Phosphatase: 83 U/L (ref 38–126)
Anion gap: 12 (ref 5–15)
BUN: 10 mg/dL (ref 8–23)
CO2: 23 mmol/L (ref 22–32)
Calcium: 8.1 mg/dL — ABNORMAL LOW (ref 8.9–10.3)
Chloride: 107 mmol/L (ref 98–111)
Creatinine, Ser: 0.79 mg/dL (ref 0.44–1.00)
GFR, Estimated: >60 mL/min
Glucose, Bld: 163 mg/dL — ABNORMAL HIGH (ref 70–99)
Potassium: 3.7 mmol/L (ref 3.5–5.1)
Sodium: 143 mmol/L (ref 135–145)
Total Bilirubin: 0.5 mg/dL (ref 0.0–1.2)
Total Protein: 6.6 g/dL (ref 6.5–8.1)

## 2024-09-04 LAB — URINALYSIS, ROUTINE W REFLEX MICROSCOPIC
Bilirubin Urine: NEGATIVE
Glucose, UA: 250 mg/dL — AB
Ketones, ur: 15 mg/dL — AB
Leukocytes,Ua: NEGATIVE
Nitrite: NEGATIVE
Protein, ur: 100 mg/dL — AB
Specific Gravity, Urine: 1.015 (ref 1.005–1.030)
pH: 7 (ref 5.0–8.0)

## 2024-09-04 LAB — GLUCOSE, CAPILLARY
Glucose-Capillary: 136 mg/dL — ABNORMAL HIGH (ref 70–99)
Glucose-Capillary: 119 mg/dL — ABNORMAL HIGH (ref 70–99)

## 2024-09-04 LAB — URINALYSIS, MICROSCOPIC (REFLEX)

## 2024-09-04 LAB — CBG MONITORING, ED
Glucose-Capillary: 147 mg/dL — ABNORMAL HIGH (ref 70–99)
Glucose-Capillary: 136 mg/dL — ABNORMAL HIGH (ref 70–99)

## 2024-09-04 LAB — TSH: TSH: 0.863 u[IU]/mL (ref 0.350–4.500)

## 2024-09-04 LAB — TROPONIN T, HIGH SENSITIVITY: Troponin T High Sensitivity: 19 ng/L (ref 0–19)

## 2024-09-04 LAB — MAGNESIUM: Magnesium: 1.8 mg/dL (ref 1.7–2.4)

## 2024-09-04 MED ORDER — LACTATED RINGERS IV SOLN: INTRAVENOUS | Status: AC

## 2024-09-04 MED ORDER — INSULIN GLARGINE-YFGN 100 UNIT/ML ~~LOC~~ SOLN
15.0000 [IU] | Freq: Every day | SUBCUTANEOUS | Status: DC
  Administered 2024-09-04 – 2024-09-06 (×3): 15 [IU] via SUBCUTANEOUS
  Filled 2024-09-04 (×3): qty 0.15

## 2024-09-04 MED ORDER — MAGNESIUM SULFATE 2 GM/50ML IV SOLN
2.0000 g | Freq: Once | INTRAVENOUS | Status: AC
  Administered 2024-09-04: 2 g via INTRAVENOUS
  Filled 2024-09-04: qty 50

## 2024-09-04 MED ORDER — ONDANSETRON HCL 4 MG/2ML IJ SOLN
4.0000 mg | Freq: Four times a day (QID) | INTRAMUSCULAR | Status: DC | PRN
  Administered 2024-09-04 (×2): 4 mg via INTRAVENOUS
  Filled 2024-09-04 (×2): qty 2

## 2024-09-04 MED ORDER — INSULIN ASPART 100 UNIT/ML IJ SOLN: 0.0000 [IU] | Freq: Every day | INTRAMUSCULAR | Status: DC

## 2024-09-04 MED ORDER — INSULIN ASPART 100 UNIT/ML IJ SOLN
0.0000 [IU] | Freq: Three times a day (TID) | INTRAMUSCULAR | Status: DC
  Administered 2024-09-04 – 2024-09-05 (×4): 1 [IU] via SUBCUTANEOUS
  Filled 2024-09-04 (×4): qty 1

## 2024-09-04 MED ORDER — MELATONIN 3 MG PO TABS
3.0000 mg | ORAL_TABLET | Freq: Every evening | ORAL | Status: DC | PRN
  Administered 2024-09-05: 3 mg via ORAL
  Filled 2024-09-04: qty 1

## 2024-09-04 MED ORDER — POLYETHYLENE GLYCOL 3350 17 G PO PACK
17.0000 g | PACK | Freq: Two times a day (BID) | ORAL | Status: DC
  Administered 2024-09-04 – 2024-09-05 (×3): 17 g via ORAL
  Filled 2024-09-04 (×3): qty 1

## 2024-09-04 MED ORDER — ATORVASTATIN CALCIUM 10 MG PO TABS
10.0000 mg | ORAL_TABLET | Freq: Every day | ORAL | Status: DC
  Administered 2024-09-05 – 2024-09-06 (×2): 10 mg via ORAL
  Filled 2024-09-04 (×3): qty 1

## 2024-09-04 MED ORDER — PREGABALIN 75 MG PO CAPS
150.0000 mg | ORAL_CAPSULE | Freq: Three times a day (TID) | ORAL | Status: DC
  Administered 2024-09-04 – 2024-09-06 (×6): 150 mg via ORAL
  Filled 2024-09-04 (×7): qty 2

## 2024-09-04 MED ORDER — SMOG ENEMA
960.0000 mL | Freq: Once | RECTAL | Status: DC
  Filled 2024-09-04: qty 960

## 2024-09-04 MED ORDER — ACETAMINOPHEN 650 MG RE SUPP: 650.0000 mg | Freq: Four times a day (QID) | RECTAL | Status: DC | PRN

## 2024-09-04 MED ORDER — AMLODIPINE BESYLATE 10 MG PO TABS
10.0000 mg | ORAL_TABLET | Freq: Every day | ORAL | Status: DC
  Administered 2024-09-05 – 2024-09-06 (×2): 10 mg via ORAL
  Filled 2024-09-04: qty 2
  Filled 2024-09-04 (×2): qty 1

## 2024-09-04 MED ORDER — ACETAMINOPHEN 325 MG PO TABS: 650.0000 mg | ORAL_TABLET | Freq: Four times a day (QID) | ORAL | Status: DC | PRN

## 2024-09-04 MED ORDER — IRBESARTAN 75 MG PO TABS
150.0000 mg | ORAL_TABLET | Freq: Every day | ORAL | Status: DC
  Administered 2024-09-05 – 2024-09-06 (×2): 150 mg via ORAL
  Filled 2024-09-04 (×2): qty 2

## 2024-09-04 MED ORDER — PANTOPRAZOLE SODIUM 40 MG IV SOLR
40.0000 mg | Freq: Every day | INTRAVENOUS | Status: DC
  Administered 2024-09-04 – 2024-09-06 (×3): 40 mg via INTRAVENOUS
  Filled 2024-09-04 (×3): qty 10

## 2024-09-04 MED ORDER — LABETALOL HCL 5 MG/ML IV SOLN: 10.0000 mg | INTRAVENOUS | Status: DC | PRN

## 2024-09-04 MED ORDER — ASPIRIN 81 MG PO TBEC
81.0000 mg | DELAYED_RELEASE_TABLET | Freq: Every day | ORAL | Status: DC
  Administered 2024-09-05 – 2024-09-06 (×2): 81 mg via ORAL
  Filled 2024-09-04 (×3): qty 1

## 2024-09-04 MED ORDER — BISACODYL 10 MG RE SUPP
10.0000 mg | Freq: Once | RECTAL | Status: AC
  Administered 2024-09-04: 10 mg via RECTAL
  Filled 2024-09-04: qty 1

## 2024-09-04 MED ORDER — METOCLOPRAMIDE HCL 5 MG/ML IJ SOLN
10.0000 mg | Freq: Four times a day (QID) | INTRAMUSCULAR | Status: DC | PRN
  Administered 2024-09-04: 10 mg via INTRAVENOUS
  Filled 2024-09-04: qty 2

## 2024-09-04 MED ORDER — METOCLOPRAMIDE HCL 5 MG/ML IJ SOLN
5.0000 mg | Freq: Four times a day (QID) | INTRAMUSCULAR | Status: DC
  Administered 2024-09-04 – 2024-09-06 (×7): 5 mg via INTRAVENOUS
  Filled 2024-09-04 (×7): qty 2

## 2024-09-04 NOTE — ED Provider Notes (Signed)
 Just as this patient was getting discharged, she started having multiple episodes of vomiting.  Will admit   Gwendolyn Golas, MD 09/04/24 [phone]

## 2024-09-04 NOTE — ED Provider Notes (Signed)
 I assumed care at signout. Plan was to follow-up on imaging and labs No acute findings on CT scan.  No signs of UTI. Troponin unremarkable Patient reports feeling improved and she is drinking fluids. We discussed lab and imaging findings. Unclear if this is an acute/viral process versus new onset gastroparesis with a history of diabetes She denies any recent diarrhea to suggest gastroenteritis    Midge Golas, MD 09/04/24 [phone]

## 2024-09-04 NOTE — ED Notes (Signed)
 Pt was getting ready for discharge and began to have multiple episodes of vomiting. EDP made aware. Pt returned to the room and placed on the cardiac monitors.

## 2024-09-04 NOTE — ED Notes (Signed)
 PT still N/V.  Tried to eat breakfast but couldn't tolerate it.

## 2024-09-04 NOTE — ED Provider Notes (Signed)
 D/w dr marcene for admission   Gwendolyn Golas, MD 09/04/24 [phone]

## 2024-09-04 NOTE — Progress Notes (Signed)
 Patient admitted after midnight, please see H&P.patient continues to have nausea.  Denies any diarrhea, denies headache denies change in vision.  CT scan was suggestive of constipation but currently patient vomiting so we will use enemas/suppositories.  Will also check DG abdomen.  I have scheduled Reglan  as well.  Place patient on a clear liquid diet Harlene Bowl DO

## 2024-09-04 NOTE — H&P (Signed)
 " History and Physical      [name] FMW:[id number] DOB: Sep 24, 1950 DOA: 09/03/2024; DOS: 09/04/2024  PCP: Haze Kingfisher, MD  Patient coming from: home   I have personally briefly reviewed patient's old medical records in [facility]  Chief Complaint: Nausea vomiting  HPI: [name] is a 74 y.o. 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   The patient reports 1 day of recurrent nausea resulting in at least 5-6 episodes of nonbloody, nonbilious emesis over that timeframe, with most recent episodes of emesis occurring in the emergency department this evening.  The patient reports associated significant decline in oral intake over the last day, and notes that she has been unable to take her outpatient oral medications over the last day as a consequence of the recurrent nausea/vomiting.  Denies any associated diarrhea, nor any recent melena or hematochezia.  Denies any associated subjective fever, chills, rigors, or generalized nauseous.  No recent dysuria or gross hematuria.  After the first few episodes of vomiting, she notes mild generalized crampy abdominal discomfort.  No recent chest pain, shortness of breath.  Has a history of type 2 diabetes mellitus complicated by diabetic peripheral polyneuropathy for which he is on Lyrica .  She is insulin -dependent, with outpatient diabetic management that includes use of Ozempic .  Most recent hemoglobin A1c was found to be 6.6% on 07/15/2024.  Denies any routine alcohol consumption or any use of recreational drugs.  She has a history of cholecystectomy, as well as esophageal dilation in 2008 in  the setting of esophageal stricture.  She also has a history of GERD.   ED Course:  Vital signs in the ED were notable for the following: Afebrile; initial heart rates in the low 100s, subsequent decreasing into the 80s following initiation of IV fluids in the ED; still blood pressures initially in the 170s to 180s, septally decreasing into the 150s to 160s following dose of IV labetalol  in the ED, respiratory rate 14-22, oxygen  saturation 98 to 100% on room air.  Labs were notable for the following: VBG notable for 7.428/45.  Initial CBG 200.  CMP notable for the following: Sodium 140, potassium 3.2, bicarbonate 27, anion gap 12, creatinine 0.86 compared to 0.82 on 07/15/2024, glucose 245, liver enzymes were within normal limits.  Lipase 38.  Troponin 19.  CBC notable for white cell count 7000, hemoglobin 13.7.  Urinalysis showed no white blood cells and was leukocyte esterase/nitrate negative.  Per my interpretation, EKG in ED demonstrated the following: In comparison to most recent prior EKG from 07/06/2024, today's EKG shows sinus rhythm with heart rate 91, normal intervals, no evidence of T wave changes and less than 1 mm ST elevation in V2, which appears unchanged from most recent prior EKG, will demonstrating no evidence of new ST changes.  Imaging in the ED, per corresponding formal radiology read, was notable for the following: CT abdomen/pelvis with contrast showed no evidence of acute intra-abdominal or acute intrapelvic process, including no evidence of bowel obstruction, abscess, or perforation or any evidence of acute pancreatic findings.  CT abdomen/pelvis also showed status post cholecystectomy, without any evidence of biliary ductal dilation .   While in the ED, the following were administered: 2.  I will  1.25 mg IV x 1, Reglan  10 mg IV x 1, Compazine  10 mg IV x 1, labetalol  10 mg IV x 1, potassium chloride  40 mill colons IV over 4 hours, normal saline x 500 cc bolus.  However, in spite  of the above interventions, the patient has continued to vomit, and failed p.o. challenge.  Subsequently, the patient was admitted for further evaluation management of intractable nausea/vomiting complicated by hypokalemia.    Review of Systems: As per HPI otherwise 10 point review of systems negative.   Past Medical History:  Diagnosis Date   Aortic stenosis    mild on 04/07/19 echo   Arthritis    L knee, hands, back    Asthma    No issues in many years as of 2025   Diabetes mellitus type 2, insulin  dependent (HCC)    Full dentures    Full dentures    GERD (gastroesophageal reflux disease)    H/O hiatal hernia    History of esophageal dilatation    History of rhabdomyolysis    03/ 2015   Hyperlipidemia    Hypertension    Insulin  pump in place    since 12/ 2014--  [employer]   Lumbar disc herniation    right leg weakness   Moderate nonproliferative diabetic retinopathy (HCC) 04/03/2022   Osteoarthritis of left knee, primary localized 08/17/2014   Pneumonia    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   Renal insufficiency    Rhabdomyolysis 11/15/2013   SUI (stress urinary incontinence, female)    Syncope 12/27/2017   Wears glasses    Wears glasses     Past Surgical History:  Procedure Laterality Date   BLADDER SUSPENSION N/A 03/09/2014   Procedure: CYSTOSCOPY/SLING;  Surgeon: Glendia DELENA Elizabeth, MD;  Location: [facility] [place];  Service: Urology;  Laterality: N/A;   CARDIAC CATHETERIZATION  07-20-2009   DR DEBBY SOR   MODERATE LVH/  NORMAL  LVEF/  NORMAL CORONARY AND RENAL ARTERIES   CARPAL TUNNEL RELEASE Right 2000   CHOLECYSTECTOMY  1990   COLONOSCOPY WITH ESOPHAGOGASTRODUODENOSCOPY (EGD)  06-18-2002   ESOPHAGOGASTRODUODENOSCOPY (EGD) WITH ESOPHAGEAL DILATION  06-12-2010   EXCISION LEFT UPPER ARM MASS  01-29-2014   EYE SURGERY  2010   laser on R eye   INTERSTIM IMPLANT REMOVAL N/A 03/06/2019   Procedure: REMOVAL OF RENNA IMPLANT;  Surgeon: Elizabeth Glendia, MD;   Location: [facility];  Service: Urology;  Laterality: N/A;   KNEE ARTHROSCOPY Right 1989  &  2002   LUMBAR LAMINECTOMY N/A 02/19/2020   Procedure: right L4 hemiaminectomy, microdiscectomy;  Surgeon: Barbarann Oneil BROCKS, MD;  Location: [facility];  Service: Orthopedics;  Laterality: N/A;   MULTIPLE TOOTH EXTRACTIONS     NEGATIVE SLEEP STUDY  2012   PER PT   PARTIAL KNEE ARTHROPLASTY Left 08/17/2014   Procedure: LEFT UNICOMPARTMENTAL KNEE;  Surgeon: Fonda SHAUNNA Olmsted, MD;  Location: [facility] OR;  Service: Orthopedics;  Laterality: Left;    Social History:  reports that she has never smoked. She has never used smokeless tobacco. She reports that she does not drink alcohol and does not use drugs.   Allergies[1]  Family History  Problem Relation Age of Onset   Diabetes Mother    Hypertension Mother    Lung cancer Mother        smoked   Diabetes Father    Hypertension Father    Lung cancer Father        smoked   Heart failure Sister    Diabetes  Brother    Diabetes Maternal Grandmother    Cancer Maternal Grandmother     Family history reviewed and not pertinent    Prior to Admission medications  Medication Sig Start Date End Date Taking? Authorizing Provider  amLODipine  (NORVASC ) 10 MG tablet Take 10 mg by mouth daily.   Yes [provider]  aspirin  EC 81 MG EC tablet Take 1 tablet (81 mg total) by mouth daily. Swallow whole. 12/01/20   Regalado, Belkys A, MD  atorvastatin  (LIPITOR) 10 MG tablet Take 1 tablet (10 mg total) by mouth daily. 12/29/21   Georgina Speaks, FNP  blood glucose meter kit and supplies KIT Dispense based on patient and insurance preference. Check blood sugars 4 times daily E11.69 12/29/20   Georgina Speaks, FNP  Continuous Blood Gluc Receiver (FREESTYLE LIBRE READER) DEVI 1 each by Does not apply route See admin instructions. CGM device-Freestyle [employer], Historical, MD  Continuous Blood Gluc Sensor (FREESTYLE LIBRE 14 DAY SENSOR) MISC 1 each by Does not apply route See  admin instructions. CGM 14-day sensors; send refills to [facility] (in [employer])    [provider]  DULoxetine  (CYMBALTA ) 30 MG capsule TAKE 1 CAPSULE BY MOUTH DAILY 07/30/23   Patel, Donika K, DO  Finerenone  (KERENDIA ) 10 MG TABS Take 1 tablet by mouth daily. 01/19/22   Georgina Speaks, FNP  HUMALOG  100 UNIT/ML injection Inject 0.1 mLs (10 Units total) into the skin 3 (three) times daily with meals. Patient taking differently: Inject 10 Units into the skin 2 (two) times daily with a meal. 05/10/21   Georgina Speaks, FNP  HYDROcodone -acetaminophen  (NORCO/VICODIN) 5-325 MG tablet Take 1-2 tablets by mouth every 6 (six) hours as needed for moderate pain (pain score 4-6). 07/18/24   Vernetta Lonni GRADE, MD  insulin  degludec (TRESIBA  FLEXTOUCH) 100 UNIT/ML FlexTouch Pen Inject 27 Units into the skin daily. Patient taking differently: Inject 40 Units into the skin daily. 08/28/21   Georgina Speaks, FNP  Insulin  Pen Needle (BD PEN NEEDLE MICRO U/F) 32G X 6 MM MISC DX:E11.65 USE TO CHECK BLOOD SUGARS THREE TIMES A DAY 05/12/20   Jarold Medici, MD  Lancets Misc. MISC 1 each by Does not apply route 4 (four) times daily -  with meals and at bedtime. 12/16/18   Georgina Speaks, FNP  linaclotide  (LINZESS ) 145 MCG CAPS capsule Take 1 capsule (145 mcg total) by mouth daily before breakfast. 12/19/21   Moore, Janece, FNP  methocarbamol  (ROBAXIN ) 500 MG tablet Take 1 tablet (500 mg total) by mouth every 8 (eight) hours as needed for muscle spasms. 07/17/24   Magnant, Charles L, PA-C  ondansetron  (ZOFRAN -ODT) 4 MG disintegrating tablet Take 1 tablet (4 mg total) by mouth every 8 (eight) hours as needed. 07/18/24   Vernetta Lonni GRADE, MD  [facility] VERIO test strip USE TO test blood sugar FOUR TIMES DAILY 02/15/21   Georgina Speaks, FNP  oxyCODONE  (OXY IR/ROXICODONE ) 5 MG immediate release tablet Take 1 tablet (5 mg total) by mouth every 4 (four) hours as needed for severe pain (pain score 7-10). 07/21/24   Addie Cordella Hamilton, MD  pantoprazole  (PROTONIX ) 40 MG tablet TAKE ONE TABLET BY MOUTH ONCE DAILY 12/25/21   Moore, Janece, FNP  pregabalin  (LYRICA ) 150 MG capsule Take 150 mg by mouth in the morning, at noon, and at bedtime.    [provider]  pregabalin  (LYRICA ) 50 MG capsule TAKE ONE CAPSULE BY MOUTH BEFORE BREAKFAST and TAKE ONE CAPSULE BY MOUTH EVERY EVENING  and TAKE ONE CAPSULE BY MOUTH EVERYDAY AT BEDTIME Patient not taking: Reported on 08/26/2024 10/25/21   Georgina Speaks, FNP  rosuvastatin (CRESTOR) 20 MG tablet Take 20 mg by mouth daily.    [provider]  Semaglutide , 2 MG/DOSE, (OZEMPIC , 2 MG/DOSE,) 8 MG/3ML SOPN Inject 2 mg into the skin every 7 (seven) days.    [provider]  valsartan  (DIOVAN ) 160 MG tablet Take 160 mg by mouth daily.    [provider]  valsartan  (DIOVAN ) 40 MG tablet Take 1 tablet (40 mg total) by mouth daily. Patient not taking: Reported on 08/26/2024 04/18/22 07/06/24  Moore, Janece, FNP  Vibegron (GEMTESA) 75 MG TABS Take 75 mg by mouth daily.    [provider]  zolpidem  (AMBIEN ) 5 MG tablet Take 5 mg by mouth at bedtime as needed for sleep. 02/07/24   [provider]     Objective    Physical Exam: Vitals:   09/04/24 0230 09/04/24 0245 09/04/24 0300 09/04/24 0315  BP: (!) 184/92 (!) 158/91 (!) 166/94 (!) 154/82  Pulse: 92 90 84 81  Resp: 16 16 13 14   Temp:      TempSrc:      SpO2: 100% 100% 100% 100%  Weight:      Height:        General: appears to be stated age; alert, oriented Skin: warm, dry, no rash Head:  AT/[place] Mouth:  Oral mucosa membranes appear dry, normal dentition Neck: supple; trachea midline Heart:  RRR; did not appreciate any M/R/G Lungs: CTAB, did not appreciate any wheezes, rales, or rhonchi Abdomen: + BS; soft, ND, NT Extremities: no peripheral edema, no muscle wasting        Labs on Admission: I have personally reviewed following labs and imaging studies  CBC: Recent  Labs  Lab 09/03/24 2045 09/03/24 2100  WBC 7.0  --   HGB 13.7 14.3  HCT 41.8 42.0  MCV 92.1  --   PLT 253  --    Basic Metabolic Panel: Recent Labs  Lab 09/03/24 2045 09/03/24 2100  NA 140 139  K 3.2* 3.2*  CL 100  --   CO2 27  --   GLUCOSE 245*  --   BUN 13  --   CREATININE 0.96  --   CALCIUM  9.4  --    GFR: Estimated Creatinine Clearance: 45.7 mL/min (by C-G formula based on SCr of 0.96 mg/dL). Liver Function Tests: Recent Labs  Lab 09/03/24 2045  AST 20  ALT 11  ALKPHOS 90  BILITOT 0.5  PROT 7.4  ALBUMIN 4.0   Recent Labs  Lab 09/03/24 2045  LIPASE 38   No results for input(s): AMMONIA in the last 168 hours. Coagulation Profile: No results for input(s): INR, PROTIME in the last 168 hours. Cardiac Enzymes: No results for input(s): CKTOTAL, CKMB, CKMBINDEX, TROPONINI in the last 168 hours. BNP (last 3 results) No results for input(s): PROBNP in the last 8760 hours. HbA1C: No results for input(s): HGBA1C in the last 72 hours. CBG: Recent Labs  Lab 09/03/24 2049  GLUCAP 200*   Lipid Profile: No results for input(s): CHOL, HDL, LDLCALC, TRIG, CHOLHDL, LDLDIRECT in the last 72 hours. Thyroid  Function Tests: No results for input(s): TSH, T4TOTAL, FREET4, T3FREE, THYROIDAB in the last 72 hours. Anemia Panel: No results for input(s): VITAMINB12, FOLATE, FERRITIN, TIBC, IRON, RETICCTPCT in the last 72 hours. Urine analysis:    Component Value Date/Time   COLORURINE YELLOW 09/04/2024 0035   APPEARANCEUR  TURBID (A) 09/04/2024 0035   LABSPEC 1.015 09/04/2024 0035   PHURINE 7.0 09/04/2024 0035   GLUCOSEU 250 (A) 09/04/2024 0035   GLUCOSEU >=1000 03/16/2013 1259   HGBUR MODERATE (A) 09/04/2024 0035   HGBUR trace-intact 11/17/2010 1230   BILIRUBINUR NEGATIVE 09/04/2024 0035   BILIRUBINUR negative 01/22/2023 1559   BILIRUBINUR negative 01/24/2022 1027   KETONESUR 15 (A) 09/04/2024 0035   PROTEINUR 100  (A) 09/04/2024 0035   UROBILINOGEN 1.0 01/22/2023 1559   UROBILINOGEN 0.2 05/30/2021 1350   NITRITE NEGATIVE 09/04/2024 0035   LEUKOCYTESUR NEGATIVE 09/04/2024 0035    Radiological Exams on Admission: CT ABDOMEN PELVIS W CONTRAST Result Date: 09/03/2024 EXAM: CT ABDOMEN AND PELVIS WITH CONTRAST 09/03/2024 11:30:29 PM TECHNIQUE: CT of the abdomen and pelvis was performed with the administration of 75 mL of iohexol  (OMNIPAQUE ) 350 MG/ML injection. Multiplanar reformatted images are provided for review. Automated exposure control, iterative reconstruction, and/or weight-based adjustment of the mA/kV was utilized to reduce the radiation dose to as low as reasonably achievable. COMPARISON: CT abdomen dated 12/09/2013. CLINICAL HISTORY: Bowel obstruction suspected. Nausea/vomiting. FINDINGS: LOWER CHEST: No acute abnormality. LIVER: The liver is unremarkable. GALLBLADDER AND BILE DUCTS: Status post cholecystectomy. No biliary ductal dilatation. SPLEEN: No acute abnormality. PANCREAS: No acute abnormality. ADRENAL GLANDS: No acute abnormality. KIDNEYS, URETERS AND BLADDER: No stones in the kidneys or ureters. No hydronephrosis. No perinephric or periureteral stranding. Urinary bladder is unremarkable. GI AND BOWEL: Stomach demonstrates no acute abnormality. Mild sigmoid diverticulosis, without evidence of diverticulitis. There is no bowel obstruction. PERITONEUM AND RETROPERITONEUM: No ascites. No free air. VASCULATURE: Aorta is normal in caliber. Atherosclerotic calcifications of the abdominal aorta and branch vessels, although patent. LYMPH NODES: No lymphadenopathy. REPRODUCTIVE ORGANS: Uterus is within normal limits. BONES AND SOFT TISSUES: Mild degenerative changes of the visualized thoracolumbar spine. No acute osseous abnormality. No focal soft tissue abnormality. IMPRESSION: 1. No evidence of bowel obstruction. 2. No acute findings. Electronically signed by: Pinkie Pebbles MD MD 09/03/2024 11:42 PM EST RP  Workstation: [id number]      Assessment/Plan   Principal Problem:   Intractable nausea and vomiting Active Problems:   GERD (gastroesophageal reflux disease)   DM2 (diabetes mellitus, type 2) (HCC)   Hypokalemia   History of essential hypertension        # ) intractable nausea/vomiting: 1 day recurrent nausea resulting in at least 5-6 episodes of nonbloody, nonbilious emesis, unable to tolerate p.o. over that timeframe, and refractory to multiple doses of different antiemetics in the emergency department this evening.  Complicated by acute hypokalemia.  CT abdomen/pelvis showed no evidence of acute process, as further detailed above.  Differential includes potential diabetic gastroparesis given history of diabetic peripheral polyneuropathy versus pharmacologic contribution from delayed gastric emptying via outpatient Ozempic .  It is noted that she has no formally established diagnosis of diabetic gastroparesis.  Liver enzymes are within normal limits, and lipase is also nonelevated.  Plan: Prn IV Zofran , prn IV Reglan  for refractory nausea/vomiting.  Continuous lactated Ringer 's at 44 cc/h's x 8 hours.  Check urine drug screen, TSH level.  Hold home Ozempic  for now.  Repeat CMP, CBC in the morning.  Add on magnesium  level.                       #) Hypokalemia: presenting potassium level noted to be 3.2 as a compensated recent increase in GI losses in the context of presenting intractable nausea/vomiting over the course of the last day.  She has received 40 mill colons of IV potassium chloride  in the ED this evening.  Plan: monitor on tele.  Add-on serum mag level. CMP, mag level in the AM.  Lactated Ringer 's at 75 cc/h x 8 hours.                         #) Type 2 Diabetes Mellitus: documented history of such. Home insulin  regimen: Tresiba  40 units SQ daily in addition to Humalog  10 units SQ twice daily she also noted to be on Ozempic  as an  outpatient..  Present blood sugar found to be 245, without any evidence of DKA, including no evidence of anion gap metabolic acidosis, presenting VBG shows no evidence of metabolic acidosis.  Most recent hemoglobin A1c found to be 6.6% when checked in November 2025.  In terms of initial dose of basal insulin , will start with a conservative dose to prevent ensuing hypoglycemia.  Complicated by diabetic peripheral polyneuropathy, on Lyrica  as an outpatient.  Plan: accuchecks QAC and HS with low dose SSI.  To see the 50 units SQ daily, first dose now.  Hold home Ozempic  for now.  Resume home Lyrica .                     #) GERD: documented h/o such; on Protonix  as outpatient.   Plan: In the setting of presenting intractable nausea/vomiting, will transiently give her 2 daily IV Protonix .                      #) Essential Hypertension: documented h/o such, with outpatient antihypertensive regimen including losartan , finerenone , amlodipine .  Unable to tolerate her outpatient oral and hypertensive medications over the course of the last day due to intractable nausea/vomiting.  Systolic blood pressures in the ED in the range of 150s to 180s..   Plan: Close monitoring of subsequent BP via routine VS. in the context of clinical evidence of dehydration stemming from intractable nausea/vomiting, will hold home potassium sparing diuretic.  Resume home valsartan  as well as amlodipine .  Monitor strict I's and O's and daily weights.       DVT prophylaxis: SCD's   Code Status: Full code Family Communication: none Disposition Plan: Per Rounding Team Consults called: none;  Admission status: obs     I SPENT GREATER THAN 75  MINUTES IN CLINICAL CARE TIME/MEDICAL DECISION-MAKING IN COMPLETING THIS ADMISSION.      Tigerlily Christine B Mercer Peifer DO [facility]  From 7PM - 7AM   09/04/2024, 4:05 AM         [1]  Allergies Allergen Reactions   Pollen Extract Other  (See Comments)    Congestion/sneezing   Tomato Hives and Itching   Codeine Hives, Itching and Nausea And Vomiting   "

## 2024-09-05 DIAGNOSIS — Z8249 Family history of ischemic heart disease and other diseases of the circulatory system: Secondary | ICD-10-CM | POA: Diagnosis not present

## 2024-09-05 DIAGNOSIS — E1142 Type 2 diabetes mellitus with diabetic polyneuropathy: Secondary | ICD-10-CM | POA: Diagnosis present

## 2024-09-05 DIAGNOSIS — Z833 Family history of diabetes mellitus: Secondary | ICD-10-CM | POA: Diagnosis not present

## 2024-09-05 DIAGNOSIS — E113399 Type 2 diabetes mellitus with moderate nonproliferative diabetic retinopathy without macular edema, unspecified eye: Secondary | ICD-10-CM | POA: Diagnosis present

## 2024-09-05 DIAGNOSIS — K219 Gastro-esophageal reflux disease without esophagitis: Secondary | ICD-10-CM | POA: Diagnosis present

## 2024-09-05 DIAGNOSIS — Z96652 Presence of left artificial knee joint: Secondary | ICD-10-CM | POA: Diagnosis present

## 2024-09-05 DIAGNOSIS — Z7982 Long term (current) use of aspirin: Secondary | ICD-10-CM | POA: Diagnosis not present

## 2024-09-05 DIAGNOSIS — I350 Nonrheumatic aortic (valve) stenosis: Secondary | ICD-10-CM | POA: Diagnosis present

## 2024-09-05 DIAGNOSIS — E876 Hypokalemia: Secondary | ICD-10-CM | POA: Diagnosis present

## 2024-09-05 DIAGNOSIS — E785 Hyperlipidemia, unspecified: Secondary | ICD-10-CM | POA: Diagnosis present

## 2024-09-05 DIAGNOSIS — Z801 Family history of malignant neoplasm of trachea, bronchus and lung: Secondary | ICD-10-CM | POA: Diagnosis not present

## 2024-09-05 DIAGNOSIS — K5900 Constipation, unspecified: Secondary | ICD-10-CM | POA: Diagnosis present

## 2024-09-05 DIAGNOSIS — Z79899 Other long term (current) drug therapy: Secondary | ICD-10-CM | POA: Diagnosis not present

## 2024-09-05 DIAGNOSIS — I10 Essential (primary) hypertension: Secondary | ICD-10-CM | POA: Diagnosis present

## 2024-09-05 DIAGNOSIS — Z9049 Acquired absence of other specified parts of digestive tract: Secondary | ICD-10-CM | POA: Diagnosis not present

## 2024-09-05 DIAGNOSIS — Z794 Long term (current) use of insulin: Secondary | ICD-10-CM | POA: Diagnosis not present

## 2024-09-05 DIAGNOSIS — R112 Nausea with vomiting, unspecified: Secondary | ICD-10-CM | POA: Diagnosis present

## 2024-09-05 DIAGNOSIS — Z9641 Presence of insulin pump (external) (internal): Secondary | ICD-10-CM | POA: Diagnosis present

## 2024-09-05 LAB — BASIC METABOLIC PANEL WITH GFR
Anion gap: 10 (ref 5–15)
BUN: 13 mg/dL (ref 8–23)
CO2: 27 mmol/L (ref 22–32)
Calcium: 9.3 mg/dL (ref 8.9–10.3)
Chloride: 100 mmol/L (ref 98–111)
Creatinine, Ser: 1.07 mg/dL — ABNORMAL HIGH (ref 0.44–1.00)
GFR, Estimated: 55 mL/min — ABNORMAL LOW
Glucose, Bld: 68 mg/dL — ABNORMAL LOW (ref 70–99)
Potassium: 3.8 mmol/L (ref 3.5–5.1)
Sodium: 137 mmol/L (ref 135–145)

## 2024-09-05 LAB — GLUCOSE, CAPILLARY
Glucose-Capillary: 115 mg/dL — ABNORMAL HIGH (ref 70–99)
Glucose-Capillary: 127 mg/dL — ABNORMAL HIGH (ref 70–99)
Glucose-Capillary: 138 mg/dL — ABNORMAL HIGH (ref 70–99)
Glucose-Capillary: 82 mg/dL (ref 70–99)

## 2024-09-05 LAB — CBC
HCT: 40.1 % (ref 36.0–46.0)
Hemoglobin: 13.2 g/dL (ref 12.0–15.0)
MCH: 29.9 pg (ref 26.0–34.0)
MCHC: 32.9 g/dL (ref 30.0–36.0)
MCV: 90.9 fL (ref 80.0–100.0)
Platelets: 254 K/uL (ref 150–400)
RBC: 4.41 MIL/uL (ref 3.87–5.11)
RDW: 13.1 % (ref 11.5–15.5)
WBC: 11.4 K/uL — ABNORMAL HIGH (ref 4.0–10.5)
nRBC: 0 % (ref 0.0–0.2)

## 2024-09-05 MED ORDER — SENNA 8.6 MG PO TABS
2.0000 | ORAL_TABLET | Freq: Every day | ORAL | Status: DC
  Administered 2024-09-05 – 2024-09-06 (×2): 17.2 mg via ORAL
  Filled 2024-09-05 (×2): qty 2

## 2024-09-05 MED ORDER — DULOXETINE HCL 60 MG PO CPEP
60.0000 mg | ORAL_CAPSULE | Freq: Every morning | ORAL | Status: DC
  Administered 2024-09-05 – 2024-09-06 (×2): 60 mg via ORAL
  Filled 2024-09-05 (×2): qty 1

## 2024-09-05 MED ORDER — LINACLOTIDE 145 MCG PO CAPS
145.0000 ug | ORAL_CAPSULE | Freq: Every day | ORAL | Status: DC
  Administered 2024-09-05 – 2024-09-06 (×2): 145 ug via ORAL
  Filled 2024-09-05 (×2): qty 1

## 2024-09-05 MED ORDER — POLYETHYLENE GLYCOL 3350 17 GM/SCOOP PO POWD
119.0000 g | Freq: Once | ORAL | Status: DC
  Filled 2024-09-05: qty 119

## 2024-09-05 MED ORDER — SMOG ENEMA
960.0000 mL | Freq: Once | RECTAL | Status: AC
  Administered 2024-09-06: 960 mL via RECTAL
  Filled 2024-09-05: qty 960

## 2024-09-05 NOTE — Progress Notes (Signed)
 " PROGRESS NOTE    [name]  FMW:[id number] DOB: 1950/11/17 DOA: 09/03/2024 PCP: Haze Kingfisher, MD    Brief Narrative:  [name] is a 74 y.o. 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    Assessment and Plan: N/V due to Constipation -bowel regimen -IV reglan  scheduled for another day -stop ozempic  for now Ambulate -anticipate d/c in AM  HTN -resume home meds  DM -SSI  Hypokalemia -repleted   DVT prophylaxis: SCDs Start: 09/04/24 0401    Code Status: Full Code Family Communication:   Disposition Plan:  Level of care: Telemetry Status is: Observation     Consultants:  none   Subjective: Tolerating clears Small BM yesterday  Objective: Vitals:   09/04/24 1630 09/04/24 1940 09/05/24 0544 09/05/24 0748  BP: (!) 154/89 (!) 173/72 (!) 149/67 (!) 176/74  Pulse: 92 90 80 84  Resp: 18 18 18 18   Temp: 98.1 F (36.7 C) 98.3 F (36.8 C) 97.9 F (36.6 C) 97.7 F (36.5 C)  TempSrc:      SpO2: 100% 100% 100% 100%  Weight:      Height:        Intake/Output Summary (Last 24 hours) at 09/05/2024 1219 Last data filed at 09/05/2024 9251 Gross per 24 hour  Intake 734.36 ml  Output 0 ml  Net 734.36 ml   Filed Weights   09/03/24 2029  Weight: 60 kg    Examination:   General: Appearance:    elderly female in no acute distress     Lungs:     Clear to auscultation bilaterally, respirations unlabored  Heart:    Normal heart rate. Normal rhythm. No murmurs, rubs, or gallops.    MS:   All extremities are intact.    Neurologic:   Awake, alert, oriented x 3. No apparent focal neurological           defect.         Data Reviewed: I have personally reviewed following labs and imaging studies  CBC: Recent Labs  Lab 09/03/24 2045 09/03/24 2100 09/04/24 0429 09/05/24 0308  WBC 7.0  --  9.0 11.4*  NEUTROABS  --   --  8.1*  --   HGB 13.7 14.3 14.6 13.2  HCT 41.8 42.0 45.9 40.1  MCV 92.1  --  94.6 90.9  PLT 253  --  244 254   Basic Metabolic Panel: Recent Labs  Lab 09/03/24 2045 09/03/24 2100 09/04/24 0429 09/05/24 0308  NA 140 139 143 137  K 3.2* 3.2* 3.7 3.8  CL 100  --  107 100  CO2 27  --  23 27  GLUCOSE 245*  --  163* 68*  BUN 13  --  10 13  CREATININE 0.96  --  0.79 1.07*  CALCIUM  9.4  --  8.1* 9.3  MG  --   --  1.8  --    GFR: Estimated Creatinine Clearance: 41 mL/min (A) (by C-G formula based on SCr of 1.07 mg/dL (H)). Liver Function Tests: Recent Labs  Lab 09/03/24 2045 09/04/24 0429  AST 20 24  ALT 11 14  ALKPHOS 90 83  BILITOT 0.5 0.5  PROT 7.4 6.6  ALBUMIN 4.0 3.6   Recent Labs  Lab 09/03/24  2045  LIPASE 38   No results for input(s): AMMONIA in the last 168 hours. Coagulation Profile: No results for input(s): INR, PROTIME in the last 168 hours. Cardiac Enzymes: No results for input(s): CKTOTAL, CKMB, CKMBINDEX, TROPONINI in the last 168 hours. BNP (last 3 results) No results for input(s): PROBNP in the last 8760 hours. HbA1C: No results for input(s): HGBA1C in the last 72 hours. CBG: Recent Labs  Lab 09/04/24 1137 09/04/24 1641 09/04/24 2049 09/05/24 0746 09/05/24 1122  GLUCAP 136* 136* 119* 115* 127*   Lipid Profile: No results for input(s): CHOL, HDL, LDLCALC, TRIG, CHOLHDL, LDLDIRECT in the last 72 hours. Thyroid  Function Tests: Recent Labs    09/04/24 0429  TSH 0.863   Anemia Panel: No results for input(s): VITAMINB12, FOLATE, FERRITIN, TIBC, IRON, RETICCTPCT in the last 72 hours. Sepsis Labs: No results for input(s): PROCALCITON, LATICACIDVEN in the last 168 hours.  No results  found for this or any previous visit (from the past 240 hours).       Radiology Studies: DG Abd Portable 1V Result Date: 09/04/2024 CLINICAL DATA:  Nausea and vomiting. EXAM: PORTABLE ABDOMEN - 1 VIEW COMPARISON:  None Available. FINDINGS: The bowel gas pattern is normal. A large amount of stool is noted within the ascending colon. Radiopaque surgical clips are seen overlying the right upper quadrant. Radiopaque contrast is present within the urinary bladder. No radio-opaque calculi or other significant radiographic abnormality are seen. IMPRESSION: Large stool burden without evidence of bowel obstruction. Electronically Signed   By: Suzen Dials M.D.   On: 09/04/2024 14:04   CT ABDOMEN PELVIS W CONTRAST Result Date: 09/03/2024 EXAM: CT ABDOMEN AND PELVIS WITH CONTRAST 09/03/2024 11:30:29 PM TECHNIQUE: CT of the abdomen and pelvis was performed with the administration of 75 mL of iohexol  (OMNIPAQUE ) 350 MG/ML injection. Multiplanar reformatted images are provided for review. Automated exposure control, iterative reconstruction, and/or weight-based adjustment of the mA/kV was utilized to reduce the radiation dose to as low as reasonably achievable. COMPARISON: CT abdomen dated 12/09/2013. CLINICAL HISTORY: Bowel obstruction suspected. Nausea/vomiting. FINDINGS: LOWER CHEST: No acute abnormality. LIVER: The liver is unremarkable. GALLBLADDER AND BILE DUCTS: Status post cholecystectomy. No biliary ductal dilatation. SPLEEN: No acute abnormality. PANCREAS: No acute abnormality. ADRENAL GLANDS: No acute abnormality. KIDNEYS, URETERS AND BLADDER: No stones in the kidneys or ureters. No hydronephrosis. No perinephric or periureteral stranding. Urinary bladder is unremarkable. GI AND BOWEL: Stomach demonstrates no acute abnormality. Mild sigmoid diverticulosis, without evidence of diverticulitis. There is no bowel obstruction. PERITONEUM AND RETROPERITONEUM: No ascites. No free air. VASCULATURE: Aorta is  normal in caliber. Atherosclerotic calcifications of the abdominal aorta and branch vessels, although patent. LYMPH NODES: No lymphadenopathy. REPRODUCTIVE ORGANS: Uterus is within normal limits. BONES AND SOFT TISSUES: Mild degenerative changes of the visualized thoracolumbar spine. No acute osseous abnormality. No focal soft tissue abnormality. IMPRESSION: 1. No evidence of bowel obstruction. 2. No acute findings. Electronically signed by: Pinkie Pebbles MD MD 09/03/2024 11:42 PM EST RP Workstation: [id number]        Scheduled Meds:  amLODipine   10 mg Oral Daily   aspirin  EC  81 mg Oral Daily   atorvastatin   10 mg Oral Daily   DULoxetine   60 mg Oral q morning   insulin  aspart  0-5 Units Subcutaneous QHS   insulin  aspart  0-9 Units Subcutaneous TID WC   insulin  glargine-yfgn  15 Units Subcutaneous Daily   irbesartan   150 mg Oral Daily   linaclotide   145 mcg Oral QPC  lunch   metoCLOPramide  (REGLAN ) injection  5 mg Intravenous Q6H   pantoprazole  (PROTONIX ) IV  40 mg Intravenous Daily   polyethylene glycol  17 g Oral BID   pregabalin   150 mg Oral TID   senna  2 tablet Oral Daily   SMOG  960 mL Rectal Once   Continuous Infusions:   LOS: 0 days    Time spent: 45 minutes spent on chart review, discussion with nursing staff, consultants, updating family and interview/physical exam; more than 50% of that time was spent in counseling and/or coordination of care.    Harlene RAYMOND Bowl, DO [facility] Available via Epic secure chat 7am-7pm After these hours, please refer to coverage provider listed on [email or web address] 09/05/2024, 12:19 PM   "

## 2024-09-05 NOTE — Care Management Obs Status (Signed)
 MEDICARE OBSERVATION STATUS NOTIFICATION   Patient Details  Name: [name] MRN: [id number] Date of Birth: 29-Oct-1950   Medicare Observation Status Notification Given:  Yes    Robynn Eileen Hoose, RN 09/05/2024, 8:45 AM

## 2024-09-05 NOTE — Evaluation (Signed)
 Physical Therapy Brief Evaluation and Discharge Note Patient Details Name: [name] MRN: [id number] DOB: 09-01-1950 Today's Date: 09/05/2024   History of Present Illness  74 y.o. female who is admitted to [facility] on 09/03/2024 with intractable nausea/vomitin. 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 All education has been completed and the patient has no further questions. Mod I at baseline with occasional assist for ADLs from roommate after a recent shoulder surgery but states this has become a rare occurrence. Using SPC PRN at home, does not drive. 1 flight of steps into her handicap accessible layout apartment.  Seems to have adequate equipment. Feels weaker than baseline but better today than yesterday. Was able to ambulate >200 feet with RW, and navigate stairs without physical assistance. Roommate available to assist into/out of apt when needed. All questions answered. Would benefit from HHPT home safety follow-up. See below for any follow-up Physical Therapy or equipment needs. PT is signing off. Thank you for this referral.        PT Assessment All further PT needs can be met in the next venue of care  Assistance Needed at Discharge  PRN    Equipment Recommendations None recommended by PT  Recommendations for Other Services       Precautions/Restrictions Precautions Precautions: Fall Recall of Precautions/Restrictions: Intact Restrictions Weight Bearing Restrictions Per Provider Order: No        Mobility  Bed Mobility   Supine/Sidelying to sit: Modified independent (Device/Increased time)   General bed mobility comments: extra time no assist, able to donne socks ind.  Transfers Overall transfer  level: Needs assistance Equipment used: Rolling walker (2 wheels) Transfers: Sit to/from Stand Sit to Stand: Supervision           General transfer comment: Supervision for safety, stable with RW for support. no assist needed.    Ambulation/Gait Ambulation/Gait assistance: Supervision Gait Distance (Feet): 225 Feet Assistive device: Rolling walker (2 wheels) Gait Pattern/deviations: WFL(Within Functional Limits) Gait Speed: Below normal General Gait Details: Decreased pace but no overt LOB. Using RW appropriately. Supervision for safety. Denies dizziness, SOB.  Home Activity Instructions Home Activity Instructions: Increase activity as tolerated. Use RW for now until cleared by HHPT.  Stairs Stairs: Yes Stairs assistance: Contact guard assist Stair Management: One rail Left, Alternating pattern, Forwards, Step to pattern Number of Stairs: 13 General stair comments: CGA for safety, able to perform alternating pattern (self selected) and stable. Rail on Lt to ascend, step to pattern on descent with rail on Rt. States roommate assists on stairs at baseline as needed. Feels confident with task.  Modified Rankin (Stroke Patients Only)        Balance Overall balance assessment: Mild deficits observed, not formally tested Sitting-balance support: No upper extremity supported, Feet supported Sitting balance-Leahy Scale: Normal     Standing balance support: No upper extremity supported Standing balance-Leahy Scale: Fair            Pertinent Vitals/Pain PT - Brief Vital Signs All Vital Signs Stable: Other (comment) (HR 80s) Pain Assessment Pain Assessment: No/denies pain     Home Living Family/patient expects to be discharged to:: Private residence Living Arrangements: Non-relatives/Friends Available Help at Discharge: Friend(s);Available PRN/intermittently (Roommate is nursing student) Home Environment: Stairs to enter  [place] of Steps: 19 (bil rails) Home  Equipment: Agricultural Consultant (2 wheels);Cane - single point;Shower seat   Additional Comments: Recent Rt shoulder surgery, getting virtual PT, nearly complete.    Prior Function Level of Independence: Independent with assistive device(s) Comments: Uses SPC, 1 fall in 6 mo. States she is almost completely independent with ADLs but will occasionally ask for assist with tasks since shoulder surgery.    UE/LE Assessment        LE ROM/Strength/Tone/Coordination: Generalized weakness      Communication   Communication Communication: No apparent difficulties     Cognition Overall Cognitive Status: Appears within functional limits for tasks assessed/performed       General Comments General comments (skin integrity, edema, etc.): Feels that she has adequate support at home. No more vomiting this morning per pt report.    Exercises     Assessment/Plan    PT Problem List Decreased strength;Decreased range of motion;Decreased activity tolerance;Decreased balance;Decreased mobility;Decreased knowledge of use of DME;Decreased knowledge of precautions       PT Visit Diagnosis Unsteadiness on feet (R26.81);Other abnormalities of gait and mobility (R26.89);Muscle weakness (generalized) (M62.81);History of falling (Z91.81)    No Skilled PT     Co-evaluation                AMPAC 6 Clicks Help needed turning from your back to your side while in a flat bed without using bedrails?: None Help needed moving from lying on your back to sitting on the side of a flat bed without using bedrails?: None Help needed moving to and from a bed to a chair (including a wheelchair)?: A Little Help needed standing up from a chair using your arms (e.g., wheelchair or bedside chair)?: A Little Help needed to walk in hospital room?: A Little Help needed climbing 3-5 steps with a railing? : A Little 6 Click Score: 20      End of Session Equipment Utilized During Treatment: Gait belt Activity  Tolerance: Patient tolerated treatment well Patient left: in chair;with call bell/phone within reach;with chair alarm set Nurse Communication: Mobility status PT Visit Diagnosis: Unsteadiness on feet (R26.81);Other abnormalities of gait and mobility (R26.89);Muscle weakness (generalized) (M62.81);History of falling (Z91.81)     Time: 9057-9040 PT Time Calculation (min) (ACUTE ONLY): 17 min  Charges:   PT Evaluation $PT Eval Low Complexity: 1 Low      Leontine Roads, PT, DPT [facility]  Rehabilitation Services Physical Therapist Office: [phone] Website: [email or web address]   Leontine GORMAN Roads  09/05/2024, 12:34 PM

## 2024-09-06 DIAGNOSIS — R112 Nausea with vomiting, unspecified: Secondary | ICD-10-CM | POA: Diagnosis not present

## 2024-09-06 LAB — GLUCOSE, CAPILLARY
Glucose-Capillary: 126 mg/dL — ABNORMAL HIGH (ref 70–99)
Glucose-Capillary: 90 mg/dL (ref 70–99)

## 2024-09-06 MED ORDER — SENNA 8.6 MG PO TABS: 2.0000 | ORAL_TABLET | Freq: Every day | ORAL | Status: AC | PRN

## 2024-09-06 MED ORDER — POLYETHYLENE GLYCOL 3350 17 G PO PACK: 17.0000 g | PACK | Freq: Two times a day (BID) | ORAL | Status: AC

## 2024-09-06 MED ORDER — TRESIBA FLEXTOUCH 100 UNIT/ML ~~LOC~~ SOPN: 40.0000 [IU] | PEN_INJECTOR | Freq: Every day | SUBCUTANEOUS | Status: AC

## 2024-09-06 NOTE — TOC Transition Note (Signed)
 Transition of Care [facility]) - Discharge Note   Patient Details  Name: [name] MRN: [id number] Date of Birth: 1950-10-12  [facility] CM/SW Contact:  Robynn Eileen Hoose, RN Phone Number: 09/06/2024, 11:28 AM   Clinical Narrative:   Patient is being discharged today. Home Health PT orders noted, referral sent to [facility] with [facility]. Awaiting response.          Patient Goals and CMS Choice            Discharge Placement                       Discharge Plan and Services Additional resources added to the After Visit Summary for                                       Social Drivers of Health (SDOH) Interventions SDOH Screenings   Food Insecurity: No Food Insecurity (09/04/2024)  Housing: Low Risk (09/04/2024)  Transportation Needs: No Transportation Needs (09/04/2024)  Utilities: Not At Risk (09/04/2024)  Depression (PHQ2-9): Low Risk (01/24/2022)  Financial Resource Strain: Low Risk (01/24/2022)  Physical Activity: Sufficiently Active (01/24/2022)  Social Connections: Moderately Isolated (09/04/2024)  Stress: No Stress Concern Present (01/24/2022)  Tobacco Use: Low Risk (09/04/2024)     Readmission Risk Interventions     No data to display

## 2024-09-06 NOTE — Plan of Care (Signed)
 " Problem: Education: Goal: Individualized Educational Video(s) 09/06/2024 1258 by Emmitt Browning, RN Outcome: Adequate for Discharge 09/06/2024 1258 by Emmitt Browning, RN Outcome: Adequate for Discharge   Problem: Coping: Goal: Ability to adjust to condition or change in health will improve 09/06/2024 1258 by Emmitt Browning, RN Outcome: Adequate for Discharge 09/06/2024 1258 by Emmitt Browning, RN Outcome: Adequate for Discharge   Problem: Fluid Volume: Goal: Ability to maintain a balanced intake and output will improve 09/06/2024 1258 by Emmitt Browning, RN Outcome: Adequate for Discharge 09/06/2024 1258 by Emmitt Browning, RN Outcome: Adequate for Discharge   Problem: Health Behavior/Discharge Planning: Goal: Ability to identify and utilize available resources and services will improve 09/06/2024 1258 by Emmitt Browning, RN Outcome: Adequate for Discharge 09/06/2024 1258 by Emmitt Browning, RN Outcome: Adequate for Discharge Goal: Ability to manage health-related needs will improve 09/06/2024 1258 by Emmitt Browning, RN Outcome: Adequate for Discharge 09/06/2024 1258 by Emmitt Browning, RN Outcome: Adequate for Discharge   Problem: Metabolic: Goal: Ability to maintain appropriate glucose levels will improve 09/06/2024 1258 by Emmitt Browning, RN Outcome: Adequate for Discharge 09/06/2024 1258 by Emmitt Browning, RN Outcome: Adequate for Discharge   Problem: Nutritional: Goal: Maintenance of adequate nutrition will improve 09/06/2024 1258 by Emmitt Browning, RN Outcome: Adequate for Discharge 09/06/2024 1258 by Emmitt Browning, RN Outcome: Adequate for Discharge Goal: Progress toward achieving an optimal weight will improve 09/06/2024 1258 by Emmitt Browning, RN Outcome: Adequate for Discharge 09/06/2024 1258 by Emmitt Browning, RN Outcome: Adequate for Discharge   Problem: Skin Integrity: Goal: Risk for impaired skin integrity  will decrease 09/06/2024 1258 by Emmitt Browning, RN Outcome: Adequate for Discharge 09/06/2024 1258 by Emmitt Browning, RN Outcome: Adequate for Discharge   Problem: Tissue Perfusion: Goal: Adequacy of tissue perfusion will improve 09/06/2024 1258 by Emmitt Browning, RN Outcome: Adequate for Discharge 09/06/2024 1258 by Emmitt Browning, RN Outcome: Adequate for Discharge   Problem: Education: Goal: Knowledge of General Education information will improve Description: Including pain rating scale, medication(s)/side effects and non-pharmacologic comfort measures 09/06/2024 1258 by Emmitt Browning, RN Outcome: Adequate for Discharge 09/06/2024 1258 by Emmitt Browning, RN Outcome: Adequate for Discharge   Problem: Health Behavior/Discharge Planning: Goal: Ability to manage health-related needs will improve 09/06/2024 1258 by Emmitt Browning, RN Outcome: Adequate for Discharge 09/06/2024 1258 by Emmitt Browning, RN Outcome: Adequate for Discharge   Problem: Clinical Measurements: Goal: Ability to maintain clinical measurements within normal limits will improve 09/06/2024 1258 by Emmitt Browning, RN Outcome: Adequate for Discharge 09/06/2024 1258 by Emmitt Browning, RN Outcome: Adequate for Discharge Goal: Will remain free from infection 09/06/2024 1258 by Emmitt Browning, RN Outcome: Adequate for Discharge 09/06/2024 1258 by Emmitt Browning, RN Outcome: Adequate for Discharge Goal: Diagnostic test results will improve 09/06/2024 1258 by Emmitt Browning, RN Outcome: Adequate for Discharge 09/06/2024 1258 by Emmitt Browning, RN Outcome: Adequate for Discharge Goal: Respiratory complications will improve 09/06/2024 1258 by Emmitt Browning, RN Outcome: Adequate for Discharge 09/06/2024 1258 by Emmitt Browning, RN Outcome: Adequate for Discharge Goal: Cardiovascular complication will be avoided 09/06/2024 1258 by Emmitt Browning, RN Outcome:  Adequate for Discharge 09/06/2024 1258 by Emmitt Browning, RN Outcome: Adequate for Discharge   Problem: Activity: Goal: Risk for activity intolerance will decrease 09/06/2024 1258 by Emmitt Browning, RN Outcome: Adequate for Discharge 09/06/2024 1258 by Emmitt Browning, RN Outcome: Adequate for Discharge   Problem: Nutrition: Goal: Adequate nutrition will be maintained 09/06/2024 1258 by Emmitt Browning, RN Outcome: Adequate for Discharge 09/06/2024 1258 by Emmitt Browning, RN Outcome: Adequate  for Discharge   Problem: Coping: Goal: Level of anxiety will decrease 09/06/2024 1258 by Emmitt Browning, RN Outcome: Adequate for Discharge 09/06/2024 1258 by Emmitt Browning, RN Outcome: Adequate for Discharge   Problem: Elimination: Goal: Will not experience complications related to bowel motility 09/06/2024 1258 by Emmitt Browning, RN Outcome: Adequate for Discharge 09/06/2024 1258 by Emmitt Browning, RN Outcome: Adequate for Discharge Goal: Will not experience complications related to urinary retention 09/06/2024 1258 by Emmitt Browning, RN Outcome: Adequate for Discharge 09/06/2024 1258 by Emmitt Browning, RN Outcome: Adequate for Discharge   Problem: Pain Managment: Goal: General experience of comfort will improve and/or be controlled 09/06/2024 1258 by Emmitt Browning, RN Outcome: Adequate for Discharge 09/06/2024 1258 by Emmitt Browning, RN Outcome: Adequate for Discharge   Problem: Safety: Goal: Ability to remain free from injury will improve 09/06/2024 1258 by Emmitt Browning, RN Outcome: Adequate for Discharge 09/06/2024 1258 by Emmitt Browning, RN Outcome: Adequate for Discharge   Problem: Skin Integrity: Goal: Risk for impaired skin integrity will decrease 09/06/2024 1258 by Emmitt Browning, RN Outcome: Adequate for Discharge 09/06/2024 1258 by Emmitt Browning, RN Outcome: Adequate for Discharge   "

## 2024-09-06 NOTE — Discharge Summary (Signed)
 "     Physician Discharge Summary  [name] FMW:[id number] DOB: 08-09-1951 DOA: 09/03/2024  PCP: Haze Kingfisher, MD  Admit date: 09/03/2024 Discharge date: 09/06/2024  Admitted From:  Discharge disposition: Home   Recommendations for Outpatient Follow-Up:   Needs stringent bowel regimen to avoid constipation Ozempic  on hold Home health   Discharge Diagnosis:   Principal Problem:   Intractable nausea and vomiting Active Problems:   GERD (gastroesophageal reflux disease)   DM2 (diabetes mellitus, type 2) (HCC)   Hypokalemia   History of essential hypertension    Discharge Condition: Improved.  Diet recommendation: Low sodium, heart healthy  Wound care: None.  Code status: Full.   History of Present Illness:   [name] is a 74 y.o. 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    The patient reports 1 day of recurrent nausea resulting in at least 5-6 episodes of nonbloody, nonbilious emesis over that timeframe, with most recent episodes of emesis occurring in the emergency department this evening.  The patient reports associated significant decline in oral intake over the last day, and notes that she has been unable to take her outpatient oral medications over the last day as a consequence of the recurrent nausea/vomiting.  Denies any associated diarrhea, nor any recent melena or hematochezia.  Denies any associated subjective fever, chills, rigors, or generalized nauseous.  No recent dysuria or gross hematuria.  After the first few episodes of vomiting, she notes mild generalized crampy abdominal discomfort.  No recent  chest pain, shortness of breath.   Has a history of type 2 diabetes mellitus complicated by diabetic peripheral polyneuropathy for which he is on Lyrica .  She is insulin -dependent, with outpatient diabetic management that includes use of Ozempic .  Most recent hemoglobin A1c was found to be 6.6% on 07/15/2024.   Denies any routine alcohol consumption or any use of recreational drugs.   She has a history of cholecystectomy, as well as esophageal dilation in 2008 in the setting of esophageal stricture.  She also has a history of GERD.     Hospital Course by Problem:   N/V due to Constipation -bowel regimen -stop ozempic  for now Ambulate - Follow-up outpatient with PCP-colonoscopy if not had -Tolerating diet well   HTN -resume home meds -Adjust as an outpatient   DM -SSI   Hypokalemia -repleted    Medical Consultants:      Discharge Exam:   Vitals:   09/06/24 0415 09/06/24 0808  BP: (!) 154/78 (!) 155/73  Pulse: 80 80  Resp: 18 18  Temp: 98.4 F (36.9 C) 98.4 F (36.9 C)  SpO2: 99% 100%   Vitals:   09/05/24 2037 09/06/24 0415 09/06/24 0415 09/06/24 0808  BP: (!) 178/87 (!) 154/78 (!) 154/78 (!) 155/73  Pulse: 85 80 80 80  Resp: 18 18 18 18   Temp: 98.8 F (37.1 C) 98.4 F (36.9 C) 98.4 F (36.9 C) 98.4 F (36.9 C)  TempSrc:      SpO2: 99% 99% 99% 100%  Weight:      Height:        General exam: Appears calm and comfortable.    The results of significant diagnostics from this hospitalization (including imaging, microbiology, ancillary and laboratory) are  listed below for reference.     Procedures and Diagnostic Studies:   DG Abd Portable 1V Result Date: 09/04/2024 CLINICAL DATA:  Nausea and vomiting. EXAM: PORTABLE ABDOMEN - 1 VIEW COMPARISON:  None Available. FINDINGS: The bowel gas pattern is normal. A large amount of stool is noted within the ascending colon. Radiopaque surgical clips are seen overlying the right upper quadrant. Radiopaque contrast  is present within the urinary bladder. No radio-opaque calculi or other significant radiographic abnormality are seen. IMPRESSION: Large stool burden without evidence of bowel obstruction. Electronically Signed   By: Suzen Dials M.D.   On: 09/04/2024 14:04   CT ABDOMEN PELVIS W CONTRAST Result Date: 09/03/2024 EXAM: CT ABDOMEN AND PELVIS WITH CONTRAST 09/03/2024 11:30:29 PM TECHNIQUE: CT of the abdomen and pelvis was performed with the administration of 75 mL of iohexol  (OMNIPAQUE ) 350 MG/ML injection. Multiplanar reformatted images are provided for review. Automated exposure control, iterative reconstruction, and/or weight-based adjustment of the mA/kV was utilized to reduce the radiation dose to as low as reasonably achievable. COMPARISON: CT abdomen dated 12/09/2013. CLINICAL HISTORY: Bowel obstruction suspected. Nausea/vomiting. FINDINGS: LOWER CHEST: No acute abnormality. LIVER: The liver is unremarkable. GALLBLADDER AND BILE DUCTS: Status post cholecystectomy. No biliary ductal dilatation. SPLEEN: No acute abnormality. PANCREAS: No acute abnormality. ADRENAL GLANDS: No acute abnormality. KIDNEYS, URETERS AND BLADDER: No stones in the kidneys or ureters. No hydronephrosis. No perinephric or periureteral stranding. Urinary bladder is unremarkable. GI AND BOWEL: Stomach demonstrates no acute abnormality. Mild sigmoid diverticulosis, without evidence of diverticulitis. There is no bowel obstruction. PERITONEUM AND RETROPERITONEUM: No ascites. No free air. VASCULATURE: Aorta is normal in caliber. Atherosclerotic calcifications of the abdominal aorta and branch vessels, although patent. LYMPH NODES: No lymphadenopathy. REPRODUCTIVE ORGANS: Uterus is within normal limits. BONES AND SOFT TISSUES: Mild degenerative changes of the visualized thoracolumbar spine. No acute osseous abnormality. No focal soft tissue abnormality. IMPRESSION: 1. No evidence of bowel obstruction. 2. No acute findings. Electronically  signed by: Pinkie Pebbles MD MD 09/03/2024 11:42 PM EST RP Workstation: [id number]     Labs:   Basic Metabolic Panel: Recent Labs  Lab 09/03/24 2045 09/03/24 2100 09/04/24 0429 09/05/24 0308  NA 140 139 143 137  K 3.2* 3.2* 3.7 3.8  CL 100  --  107 100  CO2 27  --  23 27  GLUCOSE 245*  --  163* 68*  BUN 13  --  10 13  CREATININE 0.96  --  0.79 1.07*  CALCIUM  9.4  --  8.1* 9.3  MG  --   --  1.8  --    GFR Estimated Creatinine Clearance: 41 mL/min (A) (by C-G formula based on SCr of 1.07 mg/dL (H)). Liver Function Tests: Recent Labs  Lab 09/03/24 2045 09/04/24 0429  AST 20 24  ALT 11 14  ALKPHOS 90 83  BILITOT 0.5 0.5  PROT 7.4 6.6  ALBUMIN 4.0 3.6   Recent Labs  Lab 09/03/24 2045  LIPASE 38   No results for input(s): AMMONIA in the last 168 hours. Coagulation profile No results for input(s): INR, PROTIME in the last 168 hours.  CBC: Recent Labs  Lab 09/03/24 2045 09/03/24 2100 09/04/24 0429 09/05/24 0308  WBC 7.0  --  9.0 11.4*  NEUTROABS  --   --  8.1*  --   HGB 13.7 14.3 14.6 13.2  HCT 41.8 42.0 45.9 40.1  MCV 92.1  --  94.6 90.9  PLT 253  --  244 254   Cardiac Enzymes:  No results for input(s): CKTOTAL, CKMB, CKMBINDEX, TROPONINI in the last 168 hours. BNP: Invalid input(s): POCBNP CBG: Recent Labs  Lab 09/05/24 0746 09/05/24 1122 09/05/24 1755 09/05/24 2040 09/06/24 0806  GLUCAP 115* 127* 138* 82 126*   D-Dimer No results for input(s): DDIMER in the last 72 hours. Hgb A1c No results for input(s): HGBA1C in the last 72 hours. Lipid Profile No results for input(s): CHOL, HDL, LDLCALC, TRIG, CHOLHDL, LDLDIRECT in the last 72 hours. Thyroid  function studies Recent Labs    09/04/24 0429  TSH 0.863   Anemia work up No results for input(s): VITAMINB12, FOLATE, FERRITIN, TIBC, IRON, RETICCTPCT in the last 72 hours. Microbiology No results found for this or any previous visit (from the past  240 hours).   Discharge Instructions:   Discharge Instructions     Diet Carb Modified   Complete by: As directed    Discharge instructions   Complete by: As directed    Need a good bowel regimen to avoid constipation   Increase activity slowly   Complete by: As directed       Allergies as of 09/06/2024       Reactions   Codeine Hives, Itching, Nausea And Vomiting   Pollen Extract Other (See Comments)   Congestion/sneezing   Tomato Hives, Itching        Medication List     PAUSE taking these medications    Ozempic  (2 MG/DOSE) 8 MG/3ML Sopn Wait to take this until your doctor or other care provider tells you to start again. Generic drug: Semaglutide  (2 MG/DOSE) Inject 2 mg into the skin every Thursday.       TAKE these medications    amLODipine  10 MG tablet Commonly known as: NORVASC  Take 10 mg by mouth every morning.   aspirin  81 MG chewable tablet Chew 81 mg by mouth every morning.   DULoxetine  60 MG capsule Commonly known as: CYMBALTA  Take 60 mg by mouth every morning.   Gemtesa 75 MG Tabs Generic drug: Vibegron Take 75 mg by mouth daily.   Icy Hot No-Mess Vapor Gel 5.3-1.3-2.8 % Gel Apply 1 Application topically as needed (muscle and joint pain).   insulin  lispro 100 UNIT/ML KwikPen Commonly known as: HUMALOG  Inject 3 Units into the skin with breakfast, with lunch, and with evening meal.   Kerendia  10 MG Tabs Generic drug: Finerenone  Take 1 tablet by mouth daily.   linaclotide  145 MCG Caps capsule Commonly known as: Linzess  Take 1 capsule (145 mcg total) by mouth daily before breakfast. What changed: when to take this   Multivitamin Gummies Womens Chew Chew 2 each by mouth every morning.   pantoprazole  40 MG tablet Commonly known as: PROTONIX  TAKE ONE TABLET BY MOUTH ONCE DAILY   polyethylene glycol 17 g packet Commonly known as: MIRALAX  / GLYCOLAX  Take 17 g by mouth 2 (two) times daily.   pregabalin  150 MG capsule Commonly known  as: LYRICA  Take 150 mg by mouth in the morning, at noon, and at bedtime.   rosuvastatin 20 MG tablet Commonly known as: CRESTOR Take 20 mg by mouth daily.   senna 8.6 MG Tabs tablet Commonly known as: SENOKOT Take 2 tablets (17.2 mg total) by mouth daily as needed for mild constipation.   Tresiba  FlexTouch 100 UNIT/ML FlexTouch Pen Generic drug: insulin  degludec Inject 40 Units into the skin daily.   valsartan  160 MG tablet Commonly known as: DIOVAN  Take 160 mg by mouth daily in the afternoon.   zolpidem  5 MG  tablet Commonly known as: AMBIEN  Take 5 mg by mouth at bedtime as needed for sleep.        Follow-up Information     Paliwal, Himanshu, MD Follow up in 1 week(s).   Specialty: Family Medicine Contact information: [address] [place] [address] [phone]                  Time coordinating discharge: 45 minutes  Signed:  Harlene RAYMOND Bowl DO  [facility] 09/06/2024, 11:11 AM      "

## 2024-10-07 ENCOUNTER — Ambulatory Visit: Admitting: Orthopedic Surgery
